# Patient Record
Sex: Female | Born: 1965
Health system: Southern US, Community
[De-identification: ages and names within clinical notes are randomized; demographics above are authoritative.]

## PROBLEM LIST (undated history)

## (undated) DIAGNOSIS — E669 Obesity, unspecified: Secondary | ICD-10-CM

## (undated) DIAGNOSIS — G932 Benign intracranial hypertension: Secondary | ICD-10-CM

## (undated) DIAGNOSIS — E539 Vitamin B deficiency, unspecified: Secondary | ICD-10-CM

## (undated) DIAGNOSIS — J189 Pneumonia, unspecified organism: Secondary | ICD-10-CM

## (undated) DIAGNOSIS — Z87442 Personal history of urinary calculi: Secondary | ICD-10-CM

## (undated) DIAGNOSIS — I10 Essential (primary) hypertension: Secondary | ICD-10-CM

## (undated) DIAGNOSIS — Z9889 Other specified postprocedural states: Secondary | ICD-10-CM

## (undated) DIAGNOSIS — K219 Gastro-esophageal reflux disease without esophagitis: Secondary | ICD-10-CM

## (undated) DIAGNOSIS — M1990 Unspecified osteoarthritis, unspecified site: Secondary | ICD-10-CM

## (undated) DIAGNOSIS — D649 Anemia, unspecified: Secondary | ICD-10-CM

## (undated) DIAGNOSIS — R0600 Dyspnea, unspecified: Secondary | ICD-10-CM

## (undated) DIAGNOSIS — F419 Anxiety disorder, unspecified: Secondary | ICD-10-CM

## (undated) DIAGNOSIS — K449 Diaphragmatic hernia without obstruction or gangrene: Secondary | ICD-10-CM

## (undated) DIAGNOSIS — K5090 Crohn's disease, unspecified, without complications: Secondary | ICD-10-CM

## (undated) DIAGNOSIS — R002 Palpitations: Secondary | ICD-10-CM

## (undated) DIAGNOSIS — I499 Cardiac arrhythmia, unspecified: Secondary | ICD-10-CM

## (undated) DIAGNOSIS — I209 Angina pectoris, unspecified: Secondary | ICD-10-CM

## (undated) DIAGNOSIS — R112 Nausea with vomiting, unspecified: Secondary | ICD-10-CM

## (undated) DIAGNOSIS — N879 Dysplasia of cervix uteri, unspecified: Secondary | ICD-10-CM

## (undated) DIAGNOSIS — J45909 Unspecified asthma, uncomplicated: Secondary | ICD-10-CM

## (undated) DIAGNOSIS — Z862 Personal history of diseases of the blood and blood-forming organs and certain disorders involving the immune mechanism: Secondary | ICD-10-CM

## (undated) DIAGNOSIS — G43909 Migraine, unspecified, not intractable, without status migrainosus: Secondary | ICD-10-CM

## (undated) DIAGNOSIS — K5790 Diverticulosis of intestine, part unspecified, without perforation or abscess without bleeding: Secondary | ICD-10-CM

## (undated) DIAGNOSIS — G4730 Sleep apnea, unspecified: Secondary | ICD-10-CM

## (undated) HISTORY — PX: ABDOMINAL HYSTERECTOMY: SUR658

## (undated) HISTORY — PX: ABDOMINAL HYSTERECTOMY: SHX81

## (undated) HISTORY — DX: Vitamin B deficiency, unspecified: E53.9

## (undated) HISTORY — DX: Benign intracranial hypertension: G93.2

## (undated) HISTORY — PX: SHOULDER SURGERY: SHX246

## (undated) HISTORY — PX: CARDIAC CATHETERIZATION: SHX172

---
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
## 1999-02-03 ENCOUNTER — Encounter: Payer: Self-pay | Admitting: Orthopedic Surgery

## 1999-02-03 ENCOUNTER — Ambulatory Visit (HOSPITAL_COMMUNITY): Admission: RE | Admit: 1999-02-03 | Discharge: 1999-02-03 | Payer: Self-pay | Admitting: Orthopedic Surgery

## 1999-02-05 ENCOUNTER — Ambulatory Visit (HOSPITAL_BASED_OUTPATIENT_CLINIC_OR_DEPARTMENT_OTHER): Admission: RE | Admit: 1999-02-05 | Discharge: 1999-02-05 | Payer: Self-pay | Admitting: Orthopedic Surgery

## 2000-10-22 ENCOUNTER — Ambulatory Visit (HOSPITAL_BASED_OUTPATIENT_CLINIC_OR_DEPARTMENT_OTHER): Admission: RE | Admit: 2000-10-22 | Discharge: 2000-10-22 | Payer: Self-pay | Admitting: Orthopedic Surgery

## 2001-05-09 ENCOUNTER — Ambulatory Visit (HOSPITAL_COMMUNITY): Admission: RE | Admit: 2001-05-09 | Discharge: 2001-05-09 | Payer: Self-pay | Admitting: Orthopedic Surgery

## 2001-05-09 ENCOUNTER — Encounter: Payer: Self-pay | Admitting: Orthopedic Surgery

## 2003-12-13 ENCOUNTER — Ambulatory Visit (HOSPITAL_COMMUNITY): Admission: RE | Admit: 2003-12-13 | Discharge: 2003-12-13 | Payer: Self-pay | Admitting: Family Medicine

## 2003-12-19 ENCOUNTER — Ambulatory Visit (HOSPITAL_COMMUNITY): Admission: RE | Admit: 2003-12-19 | Discharge: 2003-12-19 | Payer: Self-pay | Admitting: Family Medicine

## 2004-01-01 ENCOUNTER — Inpatient Hospital Stay (HOSPITAL_COMMUNITY): Admission: EM | Admit: 2004-01-01 | Discharge: 2004-01-01 | Payer: Self-pay | Admitting: Emergency Medicine

## 2004-01-14 ENCOUNTER — Ambulatory Visit (HOSPITAL_COMMUNITY): Admission: RE | Admit: 2004-01-14 | Discharge: 2004-01-14 | Payer: Self-pay | Admitting: Family Medicine

## 2004-02-12 ENCOUNTER — Ambulatory Visit (HOSPITAL_COMMUNITY): Admission: RE | Admit: 2004-02-12 | Discharge: 2004-02-12 | Payer: Self-pay | Admitting: Internal Medicine

## 2004-10-10 ENCOUNTER — Ambulatory Visit (HOSPITAL_COMMUNITY): Admission: RE | Admit: 2004-10-10 | Discharge: 2004-10-10 | Payer: Self-pay | Admitting: Family Medicine

## 2004-10-22 ENCOUNTER — Ambulatory Visit (HOSPITAL_COMMUNITY): Admission: RE | Admit: 2004-10-22 | Discharge: 2004-10-22 | Payer: Self-pay | Admitting: Family Medicine

## 2004-10-22 ENCOUNTER — Encounter: Payer: Self-pay | Admitting: Orthopedic Surgery

## 2004-11-13 ENCOUNTER — Ambulatory Visit: Payer: Self-pay | Admitting: Orthopedic Surgery

## 2005-01-06 ENCOUNTER — Ambulatory Visit: Payer: Self-pay | Admitting: Internal Medicine

## 2005-01-12 ENCOUNTER — Ambulatory Visit: Payer: Self-pay | Admitting: Cardiology

## 2005-01-29 ENCOUNTER — Ambulatory Visit: Payer: Self-pay | Admitting: Cardiology

## 2005-02-17 ENCOUNTER — Ambulatory Visit: Payer: Self-pay | Admitting: Internal Medicine

## 2005-02-17 ENCOUNTER — Ambulatory Visit (HOSPITAL_COMMUNITY): Admission: RE | Admit: 2005-02-17 | Discharge: 2005-02-17 | Payer: Self-pay | Admitting: Internal Medicine

## 2005-02-20 ENCOUNTER — Ambulatory Visit: Payer: Self-pay | Admitting: Cardiology

## 2005-03-16 ENCOUNTER — Ambulatory Visit: Payer: Self-pay | Admitting: Internal Medicine

## 2005-10-16 ENCOUNTER — Ambulatory Visit: Payer: Self-pay | Admitting: Internal Medicine

## 2005-10-16 ENCOUNTER — Ambulatory Visit (HOSPITAL_COMMUNITY): Admission: RE | Admit: 2005-10-16 | Discharge: 2005-10-16 | Payer: Self-pay | Admitting: Internal Medicine

## 2005-10-20 ENCOUNTER — Encounter (HOSPITAL_COMMUNITY): Admission: RE | Admit: 2005-10-20 | Discharge: 2005-11-19 | Payer: Self-pay | Admitting: Internal Medicine

## 2005-10-22 ENCOUNTER — Ambulatory Visit: Payer: Self-pay | Admitting: Internal Medicine

## 2006-05-18 ENCOUNTER — Emergency Department (HOSPITAL_COMMUNITY): Admission: EM | Admit: 2006-05-18 | Discharge: 2006-05-18 | Payer: Self-pay | Admitting: Emergency Medicine

## 2006-07-19 ENCOUNTER — Ambulatory Visit (HOSPITAL_COMMUNITY): Admission: RE | Admit: 2006-07-19 | Discharge: 2006-07-19 | Payer: Self-pay | Admitting: Family Medicine

## 2006-07-27 ENCOUNTER — Ambulatory Visit: Payer: Self-pay | Admitting: Internal Medicine

## 2006-08-02 ENCOUNTER — Inpatient Hospital Stay (HOSPITAL_COMMUNITY): Admission: EM | Admit: 2006-08-02 | Discharge: 2006-08-07 | Payer: Self-pay | Admitting: Emergency Medicine

## 2006-08-02 ENCOUNTER — Ambulatory Visit: Payer: Self-pay | Admitting: Gastroenterology

## 2006-08-11 ENCOUNTER — Ambulatory Visit (HOSPITAL_COMMUNITY): Admission: RE | Admit: 2006-08-11 | Discharge: 2006-08-11 | Payer: Self-pay | Admitting: Internal Medicine

## 2006-08-24 ENCOUNTER — Encounter (INDEPENDENT_AMBULATORY_CARE_PROVIDER_SITE_OTHER): Payer: Self-pay | Admitting: *Deleted

## 2006-08-24 ENCOUNTER — Ambulatory Visit (HOSPITAL_COMMUNITY): Admission: RE | Admit: 2006-08-24 | Discharge: 2006-08-24 | Payer: Self-pay | Admitting: Internal Medicine

## 2006-08-24 ENCOUNTER — Ambulatory Visit: Payer: Self-pay | Admitting: Internal Medicine

## 2006-09-08 ENCOUNTER — Encounter (INDEPENDENT_AMBULATORY_CARE_PROVIDER_SITE_OTHER): Payer: Self-pay | Admitting: Specialist

## 2006-09-08 ENCOUNTER — Ambulatory Visit (HOSPITAL_COMMUNITY): Admission: RE | Admit: 2006-09-08 | Discharge: 2006-09-08 | Payer: Self-pay | Admitting: General Surgery

## 2006-09-08 HISTORY — PX: CHOLECYSTECTOMY: SHX55

## 2006-10-05 DIAGNOSIS — J189 Pneumonia, unspecified organism: Secondary | ICD-10-CM

## 2006-10-05 HISTORY — DX: Pneumonia, unspecified organism: J18.9

## 2006-11-08 ENCOUNTER — Ambulatory Visit (HOSPITAL_COMMUNITY): Admission: RE | Admit: 2006-11-08 | Discharge: 2006-11-08 | Payer: Self-pay | Admitting: Internal Medicine

## 2006-11-08 ENCOUNTER — Ambulatory Visit: Payer: Self-pay | Admitting: Cardiology

## 2006-11-08 ENCOUNTER — Inpatient Hospital Stay (HOSPITAL_COMMUNITY): Admission: AD | Admit: 2006-11-08 | Discharge: 2006-11-19 | Payer: Self-pay | Admitting: Internal Medicine

## 2006-11-23 ENCOUNTER — Ambulatory Visit (HOSPITAL_COMMUNITY): Admission: RE | Admit: 2006-11-23 | Discharge: 2006-11-23 | Payer: Self-pay | Admitting: Internal Medicine

## 2006-11-26 ENCOUNTER — Ambulatory Visit (HOSPITAL_COMMUNITY): Admission: RE | Admit: 2006-11-26 | Discharge: 2006-11-26 | Payer: Self-pay | Admitting: Family Medicine

## 2006-12-08 ENCOUNTER — Ambulatory Visit (HOSPITAL_COMMUNITY): Admission: RE | Admit: 2006-12-08 | Discharge: 2006-12-08 | Payer: Self-pay | Admitting: Internal Medicine

## 2007-05-25 ENCOUNTER — Inpatient Hospital Stay (HOSPITAL_COMMUNITY): Admission: AD | Admit: 2007-05-25 | Discharge: 2007-05-25 | Payer: Self-pay | Admitting: Obstetrics and Gynecology

## 2007-05-25 ENCOUNTER — Ambulatory Visit (HOSPITAL_COMMUNITY): Admission: RE | Admit: 2007-05-25 | Discharge: 2007-05-25 | Payer: Self-pay | Admitting: Obstetrics and Gynecology

## 2007-05-27 ENCOUNTER — Encounter: Payer: Self-pay | Admitting: Emergency Medicine

## 2007-05-28 ENCOUNTER — Inpatient Hospital Stay (HOSPITAL_COMMUNITY): Admission: EM | Admit: 2007-05-28 | Discharge: 2007-05-29 | Payer: Self-pay | Admitting: Pediatrics

## 2007-06-09 ENCOUNTER — Encounter (HOSPITAL_COMMUNITY): Admission: RE | Admit: 2007-06-09 | Discharge: 2007-07-05 | Payer: Self-pay | Admitting: Pediatrics

## 2007-07-06 ENCOUNTER — Encounter (HOSPITAL_COMMUNITY): Admission: RE | Admit: 2007-07-06 | Discharge: 2007-08-05 | Payer: Self-pay | Admitting: Pediatrics

## 2008-01-04 ENCOUNTER — Ambulatory Visit (HOSPITAL_COMMUNITY): Admission: RE | Admit: 2008-01-04 | Discharge: 2008-01-04 | Payer: Self-pay | Admitting: Family Medicine

## 2008-02-13 ENCOUNTER — Ambulatory Visit (HOSPITAL_COMMUNITY): Admission: RE | Admit: 2008-02-13 | Discharge: 2008-02-13 | Payer: Self-pay | Admitting: Internal Medicine

## 2008-03-13 ENCOUNTER — Emergency Department (HOSPITAL_COMMUNITY): Admission: EM | Admit: 2008-03-13 | Discharge: 2008-03-13 | Payer: Self-pay | Admitting: Emergency Medicine

## 2008-08-07 ENCOUNTER — Emergency Department (HOSPITAL_COMMUNITY): Admission: EM | Admit: 2008-08-07 | Discharge: 2008-08-07 | Payer: Self-pay | Admitting: Emergency Medicine

## 2008-08-29 ENCOUNTER — Ambulatory Visit: Payer: Self-pay | Admitting: Internal Medicine

## 2008-08-30 ENCOUNTER — Encounter: Payer: Self-pay | Admitting: Gastroenterology

## 2008-08-30 LAB — CONVERTED CEMR LAB
ALT: 8 units/L (ref 0–35)
AST: 10 units/L (ref 0–37)
Albumin: 4.3 g/dL (ref 3.5–5.2)
Alkaline Phosphatase: 39 units/L (ref 39–117)
Eosinophils Relative: 2 % (ref 0–5)
Lymphocytes Relative: 38 % (ref 12–46)
Lymphs Abs: 2.2 10*3/uL (ref 0.7–4.0)
Monocytes Absolute: 0.5 10*3/uL (ref 0.1–1.0)
Monocytes Relative: 9 % (ref 3–12)
RDW: 14 % (ref 11.5–15.5)
Total Protein: 6.9 g/dL (ref 6.0–8.3)

## 2008-10-05 ENCOUNTER — Emergency Department (HOSPITAL_COMMUNITY): Admission: EM | Admit: 2008-10-05 | Discharge: 2008-10-05 | Payer: Self-pay | Admitting: Emergency Medicine

## 2008-10-10 ENCOUNTER — Ambulatory Visit: Payer: Self-pay | Admitting: Internal Medicine

## 2008-10-11 ENCOUNTER — Ambulatory Visit: Payer: Self-pay | Admitting: Internal Medicine

## 2008-10-12 ENCOUNTER — Ambulatory Visit: Payer: Self-pay | Admitting: Internal Medicine

## 2008-10-12 ENCOUNTER — Ambulatory Visit (HOSPITAL_COMMUNITY): Admission: RE | Admit: 2008-10-12 | Discharge: 2008-10-12 | Payer: Self-pay | Admitting: Internal Medicine

## 2008-10-30 ENCOUNTER — Ambulatory Visit (HOSPITAL_COMMUNITY): Admission: RE | Admit: 2008-10-30 | Discharge: 2008-10-30 | Payer: Self-pay | Admitting: General Surgery

## 2008-10-30 HISTORY — PX: INGUINAL HERNIA REPAIR: SUR1180

## 2008-12-18 ENCOUNTER — Ambulatory Visit (HOSPITAL_COMMUNITY): Admission: RE | Admit: 2008-12-18 | Discharge: 2008-12-18 | Payer: Self-pay | Admitting: Obstetrics and Gynecology

## 2009-04-23 ENCOUNTER — Telehealth (INDEPENDENT_AMBULATORY_CARE_PROVIDER_SITE_OTHER): Payer: Self-pay

## 2009-08-02 ENCOUNTER — Encounter: Payer: Self-pay | Admitting: Cardiology

## 2009-08-02 ENCOUNTER — Emergency Department (HOSPITAL_COMMUNITY): Admission: EM | Admit: 2009-08-02 | Discharge: 2009-08-02 | Payer: Self-pay | Admitting: Emergency Medicine

## 2009-08-02 ENCOUNTER — Encounter (INDEPENDENT_AMBULATORY_CARE_PROVIDER_SITE_OTHER): Payer: Self-pay

## 2009-08-02 LAB — CONVERTED CEMR LAB
ALT: 18 units/L
BUN: 13 mg/dL
Bilirubin, Direct: 0.6 mg/dL
CK-MB: 1 ng/mL
CO2: 29 meq/L
Calcium: 9.3 mg/dL
Chloride: 105 meq/L
Free T4: 1.06 ng/dL
Glomerular Filtration Rate, Af Am: >60 mL/min/{1.73_m2}
Glucose, Bld: 92 mg/dL
HCT: 38 %
Hemoglobin: 12.8 g/dL
MCV: 87.1 fL
Potassium: 3.1 meq/L
Sodium: 141 meq/L
Troponin I: <0.05 ng/mL
aPTT: 30 s

## 2009-08-13 ENCOUNTER — Encounter: Payer: Self-pay | Admitting: Cardiology

## 2009-08-13 ENCOUNTER — Encounter (INDEPENDENT_AMBULATORY_CARE_PROVIDER_SITE_OTHER): Payer: Self-pay

## 2009-08-13 ENCOUNTER — Ambulatory Visit: Payer: Self-pay | Admitting: Cardiology

## 2009-08-13 ENCOUNTER — Encounter: Payer: Self-pay | Admitting: Adult Health

## 2009-08-13 DIAGNOSIS — R0602 Shortness of breath: Secondary | ICD-10-CM | POA: Insufficient documentation

## 2009-08-13 DIAGNOSIS — K449 Diaphragmatic hernia without obstruction or gangrene: Secondary | ICD-10-CM | POA: Insufficient documentation

## 2009-08-13 DIAGNOSIS — I10 Essential (primary) hypertension: Secondary | ICD-10-CM | POA: Insufficient documentation

## 2009-08-13 DIAGNOSIS — R002 Palpitations: Secondary | ICD-10-CM | POA: Insufficient documentation

## 2009-08-14 ENCOUNTER — Encounter: Payer: Self-pay | Admitting: Adult Health

## 2009-08-14 ENCOUNTER — Ambulatory Visit: Payer: Self-pay | Admitting: Cardiology

## 2009-08-14 ENCOUNTER — Ambulatory Visit (HOSPITAL_COMMUNITY): Admission: RE | Admit: 2009-08-14 | Discharge: 2009-08-14 | Payer: Self-pay | Admitting: Cardiology

## 2009-08-15 ENCOUNTER — Ambulatory Visit (HOSPITAL_COMMUNITY): Admission: RE | Admit: 2009-08-15 | Discharge: 2009-08-15 | Payer: Self-pay | Admitting: Cardiology

## 2009-08-15 ENCOUNTER — Encounter: Payer: Self-pay | Admitting: Cardiology

## 2009-08-19 ENCOUNTER — Encounter: Payer: Self-pay | Admitting: Cardiology

## 2009-08-26 ENCOUNTER — Ambulatory Visit: Payer: Self-pay | Admitting: Internal Medicine

## 2009-09-09 ENCOUNTER — Ambulatory Visit: Payer: Self-pay | Admitting: Cardiology

## 2009-10-09 ENCOUNTER — Encounter: Payer: Self-pay | Admitting: Internal Medicine

## 2009-10-30 ENCOUNTER — Ambulatory Visit (HOSPITAL_COMMUNITY): Admission: RE | Admit: 2009-10-30 | Discharge: 2009-10-30 | Payer: Self-pay | Admitting: Pulmonary Disease

## 2009-11-03 ENCOUNTER — Ambulatory Visit: Admission: RE | Admit: 2009-11-03 | Discharge: 2009-11-03 | Payer: Self-pay | Admitting: Pulmonary Disease

## 2009-11-04 ENCOUNTER — Ambulatory Visit (HOSPITAL_COMMUNITY): Admission: RE | Admit: 2009-11-04 | Discharge: 2009-11-04 | Payer: Self-pay | Admitting: Pulmonary Disease

## 2009-11-11 ENCOUNTER — Telehealth: Payer: Self-pay | Admitting: Internal Medicine

## 2009-11-13 ENCOUNTER — Ambulatory Visit: Payer: Self-pay | Admitting: Internal Medicine

## 2009-11-15 ENCOUNTER — Telehealth: Payer: Self-pay | Admitting: Internal Medicine

## 2009-11-18 ENCOUNTER — Encounter: Payer: Self-pay | Admitting: Internal Medicine

## 2009-11-19 ENCOUNTER — Encounter (INDEPENDENT_AMBULATORY_CARE_PROVIDER_SITE_OTHER): Payer: Self-pay | Admitting: *Deleted

## 2009-11-22 ENCOUNTER — Inpatient Hospital Stay (HOSPITAL_BASED_OUTPATIENT_CLINIC_OR_DEPARTMENT_OTHER): Admission: RE | Admit: 2009-11-22 | Discharge: 2009-11-22 | Payer: Self-pay | Admitting: Internal Medicine

## 2009-11-22 ENCOUNTER — Ambulatory Visit: Payer: Self-pay | Admitting: Internal Medicine

## 2009-11-24 ENCOUNTER — Emergency Department (HOSPITAL_COMMUNITY): Admission: EM | Admit: 2009-11-24 | Discharge: 2009-11-24 | Payer: Self-pay | Admitting: Emergency Medicine

## 2009-12-05 ENCOUNTER — Encounter (INDEPENDENT_AMBULATORY_CARE_PROVIDER_SITE_OTHER): Payer: Self-pay | Admitting: *Deleted

## 2009-12-30 ENCOUNTER — Ambulatory Visit: Payer: Self-pay | Admitting: Internal Medicine

## 2010-01-02 LAB — CONVERTED CEMR LAB: Vit D, 25-Hydroxy: 14 ng/mL — ABNORMAL LOW (ref 30–89)

## 2010-02-25 ENCOUNTER — Ambulatory Visit: Payer: Self-pay | Admitting: Orthopedic Surgery

## 2010-02-25 DIAGNOSIS — M2550 Pain in unspecified joint: Secondary | ICD-10-CM | POA: Insufficient documentation

## 2010-02-25 DIAGNOSIS — M1712 Unilateral primary osteoarthritis, left knee: Secondary | ICD-10-CM | POA: Insufficient documentation

## 2010-04-02 ENCOUNTER — Telehealth: Payer: Self-pay | Admitting: Orthopedic Surgery

## 2010-08-25 ENCOUNTER — Ambulatory Visit (HOSPITAL_COMMUNITY)
Admission: RE | Admit: 2010-08-25 | Discharge: 2010-08-25 | Payer: Self-pay | Source: Home / Self Care | Admitting: Family Medicine

## 2010-08-25 ENCOUNTER — Ambulatory Visit: Payer: Self-pay | Admitting: Internal Medicine

## 2010-09-25 ENCOUNTER — Ambulatory Visit (HOSPITAL_COMMUNITY)
Admission: RE | Admit: 2010-09-25 | Discharge: 2010-09-25 | Payer: Self-pay | Source: Home / Self Care | Attending: Internal Medicine | Admitting: Internal Medicine

## 2010-09-25 ENCOUNTER — Ambulatory Visit: Payer: Self-pay | Admitting: Internal Medicine

## 2010-10-24 ENCOUNTER — Ambulatory Visit (HOSPITAL_COMMUNITY)
Admission: RE | Admit: 2010-10-24 | Discharge: 2010-10-24 | Payer: Self-pay | Source: Home / Self Care | Attending: Internal Medicine | Admitting: Internal Medicine

## 2010-10-24 ENCOUNTER — Ambulatory Visit: Admit: 2010-10-24 | Payer: Self-pay | Admitting: Internal Medicine

## 2010-10-25 ENCOUNTER — Encounter: Payer: Self-pay | Admitting: Family Medicine

## 2010-10-26 ENCOUNTER — Encounter: Payer: Self-pay | Admitting: Family Medicine

## 2010-10-27 ENCOUNTER — Ambulatory Visit: Admit: 2010-10-27 | Payer: Self-pay | Admitting: Internal Medicine

## 2010-10-28 ENCOUNTER — Ambulatory Visit (HOSPITAL_COMMUNITY)
Admission: RE | Admit: 2010-10-28 | Discharge: 2010-10-28 | Payer: Self-pay | Source: Home / Self Care | Attending: Internal Medicine | Admitting: Internal Medicine

## 2010-11-02 LAB — CONVERTED CEMR LAB
Basophils Absolute: 0 10*3/uL (ref 0.0–0.1)
Basophils Absolute: 0.3 10*3/uL — ABNORMAL HIGH (ref 0.0–0.1)
CO2: 30 meq/L (ref 19–32)
Calcium: 9.1 mg/dL (ref 8.4–10.5)
Calcium: 9.4 mg/dL (ref 8.4–10.5)
Chloride: 105 meq/L (ref 96–112)
Creatinine, Ser: 0.9 mg/dL (ref 0.4–1.2)
Eosinophils Absolute: 0.2 10*3/uL (ref 0.0–0.7)
Eosinophils Absolute: 0.2 10*3/uL (ref 0.0–0.7)
Eosinophils Relative: 4.1 % (ref 0.0–5.0)
Folate: 8.3 ng/mL
GFR calc non Af Amer: 88.24 mL/min (ref >60)
Glucose, Bld: 88 mg/dL (ref 70–99)
Glucose, Bld: 98 mg/dL (ref 70–99)
HCT: 41 % (ref 36.0–46.0)
Hemoglobin: 12.3 g/dL (ref 12.0–15.0)
Lymphocytes Relative: 26.7 % (ref 12.0–46.0)
Lymphs Abs: 1.4 10*3/uL (ref 0.7–4.0)
MCHC: 33.1 g/dL (ref 30.0–36.0)
MCV: 88.5 fL (ref 78.0–100.0)
MCV: 88.8 fL (ref 78.0–100.0)
Monocytes Absolute: 0.3 10*3/uL (ref 0.1–1.0)
Monocytes Absolute: 0.3 10*3/uL (ref 0.1–1.0)
Neutro Abs: 3.5 10*3/uL (ref 1.4–7.7)
Neutrophils Relative %: 59.5 % (ref 43.0–77.0)
Neutrophils Relative %: 59.7 % (ref 43.0–77.0)
Platelets: 365 10*3/uL (ref 150.0–400.0)
Potassium: 3.9 meq/L (ref 3.5–5.1)
Pro B Natriuretic peptide (BNP): 6 pg/mL (ref 0.0–100.0)
RDW: 13.5 % (ref 11.5–14.6)
RDW: 13.5 % (ref 11.5–14.6)
Sodium: 141 meq/L (ref 135–145)
Vitamin B-12: 279 pg/mL (ref 211–911)
WBC: 5 10*3/uL (ref 4.5–10.5)

## 2010-11-06 NOTE — Progress Notes (Signed)
Summary: status of cath date  Phone Note Call from Patient Call back at Home Phone [phone]   Caller: Patient Reason for Call: Talk to Nurse Action Taken: Patient advised to call 911 Details for Reason: Per pt calling , status  of cath. date  pt aware that  db nurse is not here today. Initial call taken by: Neil Crouch,  November 15, 2009 4:39 PM  Follow-up for Phone Call        Spoke with patient . Pt. states she is scheduled for a cardic cath on next Friday 2/18/ 11 in the [place] lab . Pt. needs to know the time. I called the [place] lab spoke with Maudie Mercury which states pt. is not on the schedule. He also could not search for it on the computer Kim suggest  to call the [place] lab back on  monday to find out. I also called Nettie Elm if she had pt. cath. papers. none have given to her on this patient.  I let pt. know we will find out the time of the  cath. early next week.  We will call her  asap. Okay with pt. Carollee Sires, RN, BSN  November 15, 2009 5:19 PM   Additional Follow-up for Phone Call Additional follow up Details #1::        pt calling back, req call back, [phone], Darnell Level  November 18, 2009 10:22 AM     Additional Follow-up for Phone Call Additional follow up Details #2::    HEATHER ORDERS NEED TO BE SIGNED  CATH SCHEDULED FOR FRI AT 7:30 PT AWARE. Follow-up by: Devra Dopp, LPN,  November 19, 7515 1:02 PM

## 2010-11-06 NOTE — Progress Notes (Signed)
Summary: sob, pain in chest  Phone Note Call from Patient Call back at Home Phone [phone] Call back at Work Phone [phone]   Caller: Patient Reason for Call: Talk to Nurse Details for Reason: Per pt calling need to changed appt from Nov 25, 2022 with db due to death in family. offer 11/26/2022 appt . pt c/o sob, pain in chest, swelling in right sided of face and neck. inform pt that i will send an important  message to nurse.  Initial call taken by: Neil Crouch,  November 11, 2009 9:42 AM  Follow-up for Phone Call        spoke w/pt no acute symptoms, appt resch to Wed 2022-11-26 at [facility], RN  November 11, 2009 12:02 PM

## 2010-11-06 NOTE — Miscellaneous (Signed)
Summary: Orders Update  Clinical Lists Changes  Orders: Added new Test order of TLB-B12 + Folate Pnl ([id number]/PNS) - Signed

## 2010-11-06 NOTE — Letter (Signed)
Summary: Cardiac Catheterization Instructions- [place] [place]  [address] [place]   [place], [place] [address]   Phone: [phone]  Fax: [phone]     11/13/2009 MRN: [id number]  [place], [place]  [address]  Dear Ms. Litsey,   You are scheduled for a Cardiac Catheterization on Friday 11/22/09 with Dr.Bensimhon  Please arrive to the 1st floor of the Heart and Vascular Center at [facility] at _____ am / pm on the day of your procedure. Please do not arrive before 6:30 a.m. Call the Heart and Vascular Center at [phone] if you are unable to make your appointmnet. The Code to get into the parking garage under the building is 0900. Take the elevators to the 1st floor. You must have someone to drive you home. Someone must be with you for the first 24 hours after you arrive home. Please wear clothes that are easy to get on and off and wear slip-on shoes. Do not eat or drink after midnight except water with your medications that morning. Bring all your medications and current insurance cards with you.  ___ DO NOT take these medications before your procedure: ________________________________________________________________  ___ Make sure you take your aspirin.  ___ You may take ALL of your medications with water that morning. ________________________________________________________________________________________________________________________________  ___ DO NOT take ANY medications before your procedure.  _XX__ Pre-med instructions:    Take Prednisone 39m 3 tabs PM before cath and 3 tabs AM before cath  The usual length of stay after your procedure is 2 to 3 hours. This can vary.  If you have any questions, please call the office at the number listed above.   HKevan Rosebush RN

## 2010-11-06 NOTE — Letter (Signed)
Summary: [facility] Medicine [facility] Disease of the Chest  Internal Medicine & Disease of the Chest   Imported By: Sallee Provencal 11/14/2009 13:16:13  _____________________________________________________________________  External Attachment:    Type:   Image     Comment:   External Document

## 2010-11-06 NOTE — Letter (Signed)
Summary: Generic Letter, Intro to Referring  [facility]  [address]   [place], [place] [address]   Phone: [phone]  Fax: [phone]      December 05, 2009             RE: [name]   31-Dec-1965                 [address]                 [place], [place]  [address]  Dear Gillermina Hu,  You referred this patient to our office for consult. We have tried to contact patient by phone and mail. Patient has failed to contact our office for appointment.  Sincerely,   [facility] Ph: [phone]   Fax: [phone]

## 2010-11-06 NOTE — Letter (Signed)
Summary: History form  History form   Imported By: Ruffin Pyo 02/28/2010 09:22:14  _____________________________________________________________________  External Attachment:    Type:   Image     Comment:   External Document

## 2010-11-06 NOTE — Letter (Signed)
Summary: Appointment Reminder  [facility]  [address]   [place], [place] [address]   Phone: [phone]  Fax: [phone]       November 19, 2009   [name] [address] [place], [place]  [address] [birth year]    Dear Ms. Heckmann,  We have been unable to reach you by phone to schedule a follow up   appointment that was recommended for you by Dr. Gala Romney. It is very   important that we reach you to schedule an appointment. We hope that you  allow [place] to participate in your health care needs. Please contact us at  [phone] at your earliest convenience to schedule your appointment.  Sincerely,    [facility] R. Garfield Cornea, M.D.    Caro Hight, M.D. Vickey Huger, FNP-BC    Neil Crouch, PA-C Phone: [phone]    Fax: [phone]

## 2010-11-06 NOTE — Assessment & Plan Note (Signed)
Summary: PER CHECK OUT/SF   Primary Provider:  Dr.John Hilma Favors  CC:  SOB and CP on and off with swelling mostly R side with nausea.  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 She has a h/o Cp and dyspnea and underwent cath in 2005 with normal cors.   Recently having problems with CP, palpitations and SOB with epsiode of hypoxemia while at work last year.   Saw Jory Sims in 11/10  for f/u. Had PFTs and underwent stress echo. Stress echo 08/15/2009. EF 60%. No ischemia. No comment on RV. PFTs with small airway obstruction and mild restriction. 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    Has also been seeing Dr. Luan Pulling for pulmonary w/u and everything essentially normal.  Had monitor whcih I reviewed today. Sinus rhythm with rare PVC.  Returns for f/u. About twice a month gets episode of chest tightness, SOB and tingling. Worse when she moves around but not reproducible. Many times she goes to work and is fine. Gets SOB on steps now which is unusual for her. Mild edema. Very frustrated with not being able to find an answer.      Current Medications (verified): 1)  Flexeril 5 Mg Tabs (Cyclobenzaprine Hcl) .... Take 1 Tab As Needed 2)  Vivelle-Dot 0.1 Mg/24hr Pttw (Estradiol) .... Change Patch Every 3 Days 3)  Levsin 0.125 Mg Tabs (Hyoscyamine Sulfate) .... As Needed 4)  Ibuprofen 600 Mg Tabs (Ibuprofen) .... As Needed  Allergies (verified): 1)  ! * Dilauted 2)  ! * Iv Dye  Past History:  Past Medical History: Current Problems:  h/o CP and dypnea    -cardiac cath 2005. normal EF and cors    -stress echo normal: 11/10: EF 60% Obesity Palpitations   -- holter monito 12/10  SR with PVC HIATAL HERNIA (ICD-553.3) Hypokalemia Chronic diarrhea  Review of Systems       As per HPI and past medical history; otherwise all systems negative.   Vital Signs:  Patient profile:   45 year old  female Height:      70 inches Weight:      259 pounds BMI:     37.30 Pulse rate:   75 / minute BP sitting:   138 / 84  (left arm) Cuff size:   regular  Vitals Entered By: Mignon Pine, RMA (November 13, 2009 3:26 PM)  Physical Exam  General:  Gen: well appearing. no resp difficulty HEENT: normal Neck: supple. no JVD. Carotids 2+ bilat; no bruits. No lymphadenopathy or thryomegaly appreciated. Cor: PMI nondisplaced. Regular rate & rhythm. No rubs, gallops, murmur. Lungs: clear Abdomen: obese. soft, nontender, nondistended. Normal bowel sounds. Extremities: no cyanosis, clubbing, rash, edema Neuro: alert & orientedx3, cranial nerves grossly intact. moves all 4 extremities w/o difficulty. affect pleasant    Impression & Recommendations:  Problem # 1:  CHEST TIGHTNESS-PRESSURE-OTHER ([id number]-465681) Chest pain and dyspnea. No clear etiology despite extensive work-up. We discussed options and at this point I think we should proceed with R and L heart cath to clear evalaute. sSuspect there may also be component of stress-mediated symtpoms.   Other Orders: TLB-BMP (Basic Metabolic Panel-BMET) (27517-GYFVCBS) TLB-CBC Platelet - w/Differential (85025-CBCD) TLB-PT (Protime) (85610-PTP) Cardiac Catheterization (Cardiac Cath)  Patient Instructions: 1)  Labs today 2)  Your physician has requested that you have a cardiac catheterization.  Cardiac catheterization is used to diagnose and/or treat various  heart conditions. Doctors may recommend this procedure for a number of different reasons. The most common reason is to evaluate chest pain. Chest pain can be a symptom of coronary artery disease (CAD), and cardiac catheterization can show whether plaque is narrowing or blocking your heart's arteries. This procedure is also used to evaluate the valves, as well as measure the blood flow and oxygen levels in different parts of your heart.  For further information please visit [email or web address].   Please follow instruction sheet, as given.

## 2010-11-06 NOTE — Cardiovascular Report (Signed)
Summary: Pre Cath Orders  Pre Cath Orders   Imported By: Sallee Provencal 11/20/2009 10:57:41  _____________________________________________________________________  External Attachment:    Type:   Image     Comment:   External Document

## 2010-11-06 NOTE — Assessment & Plan Note (Signed)
Summary: [name]   Vital Signs:  Patient profile:   45 year old female Height:      72 inches Weight:      256 pounds Pulse rate:   78 / minute Resp:     16 per minute  Visit Type:  New patient Referring Provider:  self Primary Provider:  Dr.John Golding-and Dr Luan Pulling  CC:  bilateral knee pain.  History of Present Illness: I saw [name] in the office today for an initial visit.  She is a 45 years old woman with the complaint of:  bilateral knee pain, right worse.  Xrays today in our office.  Meds: Viville dot, Levsin, Flexeril.  MRI L spine 2008 for review.  She complains of pain in her knees for several years which is worse at the end of the day.  She also has a history of swelling by the end of the day.  Chest throbbing burning pain mainly in the front of the knee but also in the back of the knee on the RIGHT.  Swelling is relieved by elevation.  She has some tingling locking catching and swelling as well.  She has a history of spondylolisthesis treated with physical therapy successfully  She has a family history of rheumatoid arthritis but has not been tested.  She does complain of popping and grinding in both knees.      Allergies: 1)  ! * Dilauted 2)  ! * Iv Dye  Past History:  Past Surgical History: hysterectomy 2 shoulder surgerys right laparoscopic cholecystectomy 2007 cardiac cath GED colonoscopy  Family History: M alive 33. +HTN recently had heart cath and it was good F alive 53. h/o lupus/copd FH of Cancer:  Family History of Diabetes Family History Coronary Heart Disease female < 70 Family History of Arthritis Hx, family, chronic respiratory condition Hx, family, asthma Hx, family, kidney disease NEC  Social History: Full Time resp therapist.  Tobacco Use - No.  Patient is married.  no alcohol no caffeine  Review of Systems Constitutional:  Complains of weight gain and fatigue; denies weight  loss, fever, and chills. Cardiovascular:  Complains of chest pain and palpitations; denies fainting and murmurs. Respiratory:  Complains of short of breath, wheezing, couch, tightness, and snoring; denies pain on inspiration and snoring . Gastrointestinal:  Complains of nausea, diarrhea, and constipation; denies heartburn, vomiting, and blood in your stools. Genitourinary:  Complains of urgency, difficulty urinating, painful urination, and bleeding in urine; denies frequency and flank pain. Neurologic:  Complains of numbness, tingling, and dizziness; denies unsteady gait, tremors, and seizure. Musculoskeletal:  Complains of joint pain, swelling, instability, stiffness, and muscle pain; denies redness and heat. Endocrine:  Complains of heat or cold intolerance; denies excessive thirst and exessive urination. Psychiatric:  Denies nervousness, depression, anxiety, and hallucinations. Skin:  Denies changes in the skin, poor healing, rash, itching, and redness. HEENT:  Complains of blurred or double vision and eye pain; denies redness and watering. Immunology:  Complains of seasonal allergies; denies sinus problems and allergic to bee stings. Hemoatologic:  Complains of easy bleeding; denies brusing.  Physical Exam  Skin:  intact without lesions or rashes Inguinal Nodes:  no significant adenopathy Psych:  alert and cooperative; normal mood and affect; normal attention span and concentration   Knee Exam  General:    Well-developed, well-nourished, normal body habitus; no deformities, normal grooming.  Gait:    Normal heel-toe gait pattern bilaterally.    Skin:  Intact, no scars, lesions, rashes, cafe au lait spots, or bruising.    Inspection:     No deformity, ecchymosis or swelling.   Palpation:    tenderness and crepitance is noted in both knees with range of motion.  no effusion is noted.  Vascular:    There was no swelling or varicose veins. The pulses and temperature are  normal. There was no edema or tenderness.  Sensory:    Gross coordination and sensation were normal.    Motor:    Motor strength 5/5 bilaterally for quadriceps, hamstrings, ankle dorsiflexion, and ankle plantar flexion.    Reflexes:    Normal and symmetric patellar and Achilles reflexes bilaterally.    Knee Exam:    Right:    Inspection:  Abnormal    Palpation:  Abnormal    Stability:  stable    Tenderness:  medial joint line    Swelling:  no    Erythema:  no    Range of Motion:       Flexion-Active: full       Extension-Active: full       Flexion-Passive: full       Extension-Passive: full    Left:    Inspection:  Abnormal    Palpation:  Abnormal    Stability:  stable    Tenderness:  medial joint line    Swelling:  no    Erythema:  no    Range of Motion:       Flexion-Active: full       Extension-Active: full       Flexion-Passive: full       Extension-Passive: full   Impression & Recommendations:  Problem # 1:  KNEE, ARTHRITIS, DEGEN./OSTEO (ICD-715.96) Assessment New   celebrex 230m   RA test   Her updated medication list for this problem includes:    Flexeril 5 Mg Tabs (Cyclobenzaprine hcl) ..Marland Kitchen.. Take 1 tab as needed    Ibuprofen 600 Mg Tabs (Ibuprofen) ..Marland Kitchen.. As needed    Celebrex 200 Mg Caps (Celecoxib) ..Marland Kitchen.. 1 by mouth q day  Orders: New Patient Level III ((47092 Knees  x-ray bilateral (([id number] Bilateral knee x-rays we see that she has a significant medial joint space narrowing RIGHT greater than LEFT, much more than we would expect for her age group which explained her increasing pain  Impression varus osteoarthritis moderate both knees  Medications Added to Medication List This Visit: 1)  Celebrex 200 Mg Caps (Celecoxib) ..Marland Kitchen. 1 by mouth q day  Other Orders: T-Antinuclear Antib (ANA) ([phone] T-Rheumatoid Factor ([phone]  Patient Instructions: 1)  RA test  2)  start celbrex 200 mg q day  3)  come back in a  month Prescriptions: CELEBREX 200 MG CAPS (CELECOXIB) 1 by mouth q day  #30 x 1   Entered and Authorized by:   SArther AbbottMD   Signed by:   SArther AbbottMD on 02/25/2010   Method used:   Print then Give to Patient   RxID:   [id number]

## 2010-11-06 NOTE — Progress Notes (Signed)
Summary: patient had to cancel appt for fol/up/labs  Phone Note Call from Patient   Caller: Patient Summary of Call: Patient called to cancel her 04/03/10 appointment for fol/up labs and re-check, due to work.  Has not re-sched'd yet d/t waiting for work schedule.  If need to call patient with results, home Ph # [phone] best number, has ans machine. Initial call taken by: Ihor Austin,  April 02, 2010 10:42 AM

## 2010-11-06 NOTE — Assessment & Plan Note (Signed)
Summary: eph/post cath / gd   Visit Type:  Follow-up Primary Provider:  Dr.John Golding-and Dr Luan Pulling  CC:  sob -chest pain-Pt just feels uncomfortable.  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 She has a h/o Cp and dyspnea and underwent cath in 2005 with normal cors.   Recently having problems with CP, palpitations and SOB with epsiode of hypoxemia while at work last year.   Underwent repeat cath in Febuary with normal cors and only minimally elevated pulmonary pressures. Symptoms felt to be due to stress.   Central aortic pressure 147/84 with a mean of 113. LV pressure 133/12 with EDP of 19.  Right atrial pressure mean of  8.  RV pressure 34/6 with EDP of 11.  PA pressure 33/15 with a mean of 24.  Pulmonary capillary wedge pressure mean of 13.  Fick cardiac output was 9.7 L per minute with a cardiac index of 4.2 L. Thermodilution cardiac output 7.3 L per minute with a cardiac index of 3.1 Pulmonary vascular resistance by the Fick method was 1.1 Woods, by thermodilution method was 1.5 Woods.  There was no aortic valve gradient on pullback.  Here for post cath f/u.  Feeling a bit better. Still with episodes of palpitations and occasional dyspnea but better.  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 Post cath had fever and found to have UTI and URI. treated with anitbiotics.    Current Medications (verified): 1)  Flexeril 5 Mg Tabs (Cyclobenzaprine Hcl) .... Take 1 Tab As Needed 2)  Vivelle-Dot 0.1 Mg/24hr Pttw (Estradiol) .... Change Patch Every 3 Days 3)  Levsin 0.125 Mg Tabs (Hyoscyamine Sulfate) .... As Needed 4)  Ibuprofen 600 Mg Tabs (Ibuprofen) .... As Needed 5)  Allergy Med .... Prn  Allergies (verified): 1)  ! * Dilauted 2)  ! * Iv Dye  Review of Systems       As per HPI and past medical history; otherwise all systems negative.   Vital Signs:  Patient profile:   45 year old female Height:      70  inches Weight:      256 pounds BMI:     36.86 Pulse rate:   63 / minute BP sitting:   134 / 94  (left arm) Cuff size:   large  Vitals Entered By: Lubertha Basque, CNA (December 30, 2009 10:41 AM)  Physical Exam  General:  Gen: well appearing. no resp difficulty HEENT: normal Neck: supple. no JVD. Carotids 2+ bilat; no bruits. No lymphadenopathy or thryomegaly appreciated. Cor: PMI nondisplaced. Regular rate & rhythm. No rubs, gallops, murmur. Lungs: clear Abdomen: obese. soft, nontender, nondistended. Normal bowel sounds. Extremities: no cyanosis, clubbing, rash, edema. Groin ok Neuro: alert & orientedx3, cranial nerves grossly intact. moves all 4 extremities w/o difficulty. affect pleasant    Impression & Recommendations:  Problem # 1:  CHEST TIGHTNESS-PRESSURE-OTHER ([id number]) Cath is very reassuring. We reviewed results. No significant pulmonary HTN on cath. Have reassured her.  Will continue to follow as needed. Suggested routine exercise program.   Other Orders: T-Vitamin D 25 Hydroxy ([place]) (82306-VD25) TLB-CBC Platelet - w/Differential (85025-CBCD) TLB-BMP (Basic Metabolic Panel-BMET) (49675-FFMBWGY) TLB-BNP (B-Natriuretic Peptide) (83880-BNPR)  Patient Instructions: 1)  Your physician recommends that you schedule a follow-up appointment in: 12 months with Dr Haroldine Laws

## 2010-11-08 NOTE — Op Note (Signed)
  [name], [name]              ACCOUNT NO.:  [id number]  MEDICAL RECORD NO.:  [id number]          PATIENT TYPE:  AMB  LOCATION:  DAY                           FACILITY:  [facility]  PHYSICIAN:  Hildred Laser, M.D.    DATE OF BIRTH:  1966/04/28  DATE OF PROCEDURE:  10/28/2010 DATE OF DISCHARGE:  10/24/2010                              OPERATIVE REPORT   PROCEDURE:  Small bowel given capsule study.  INDICATIONS:  [name] is a 45 year old female who has chronic diarrhea, epigastric pain and intermittent nausea and vomiting.  She had EGD and colonoscopy few days earlier.  She was found to have few erosions in her terminal ileum.  Biopsy revealed acute and chronic ileitis without granulomatous.  A differential diagnosis felt to be NSAID-induced versus Crohn disease.  She is, therefore, undergoing this study to examine rest of her small bowel.  Procedure risks were reviewed with the patient and informed consent was obtained.  FINDINGS:  The patient was able to swallow given capsule without any difficulty.  Capsule reached the stomach in 1 minute and 26 seconds, duodenum in 36 minutes and 55 seconds and ileocecal valve in 4 hours, 4 minutes and 39 seconds.  Capsule reached the cecum at 4 hours, 4 minutes and 40 seconds.  Study duration was 8 hours.  Two ulcers are identified.  The larger of the 2 ulcers best seen on frame at 3 hours, 29 minutes and 40 seconds.  Another ulcer is right at the ileocecal valve.  Between these 2 ulcers, there are multiple erosions and few erosions proximal to the ulcer is also seen at 3 hours, 29 minutes and 40 seconds.  There is also some nodularity.  There is a nodule at 3 hours, 2 minutes and 7 seconds and another one at 3 hours, 12 minutes and 58 seconds and he has another one at 3 hours 49 minutes.  In addition to these findings, there is an area in the mid small bowel best seen starting at 2 hours and 1 minute with mucosal erythema, edema and  deformity to the lumen.  Couple of these lumen is narrowed with a fish mouth appearance.  No ulcers noted in this area.  FINAL DIAGNOSES: 1. Segmental abnormality to mid small bowel with mucosal edema,     erythema and deformity to lumen without high-grade stricture. 2. Multiple erosions noted in terminal ileum along with 2 ulcers and     mucosal nodularity. 3. I am concerned that these changes are suggestive of small bowel     Crohn disease.  RECOMMENDATIONS:  We will proceed with small-bowel follow-through before further recommendations are made.  Please note that, I reviewed these findings and recommendations.  The patient and she is agreeable.     Hildred Laser, M.D.     NR/MEDQ  D:  10/28/2010  T:  10/29/2010  Job:  [id number]  cc:   Halford Chessman, M.D. Fax: [phone]  Electronically Signed by Hildred Laser M.D. on 11/08/2010 01:28:12 PM

## 2010-11-10 ENCOUNTER — Ambulatory Visit (INDEPENDENT_AMBULATORY_CARE_PROVIDER_SITE_OTHER): Payer: Self-pay | Admitting: Internal Medicine

## 2010-12-15 LAB — FECAL LACTOFERRIN, QUANT: Fecal Lactoferrin: POSITIVE

## 2010-12-15 LAB — STOOL CULTURE

## 2010-12-24 LAB — POCT I-STAT 3, VENOUS BLOOD GAS (G3P V)
Acid-Base Excess: 1 mmol/L (ref 0.0–2.0)
Acid-Base Excess: 2 mmol/L (ref 0.0–2.0)
Bicarbonate: 26.7 mEq/L — ABNORMAL HIGH (ref 20.0–24.0)
Bicarbonate: 27.3 mEq/L — ABNORMAL HIGH (ref 20.0–24.0)
Bicarbonate: 27.5 mEq/L — ABNORMAL HIGH (ref 20.0–24.0)
Bicarbonate: 27.6 mEq/L — ABNORMAL HIGH (ref 20.0–24.0)
O2 Saturation: 76 %
O2 Saturation: 76 %
O2 Saturation: 78 %
TCO2: 27 mmol/L (ref 0–100)
TCO2: 29 mmol/L (ref 0–100)
TCO2: 29 mmol/L (ref 0–100)
pCO2, Ven: 46.4 mmHg (ref 45.0–50.0)
pCO2, Ven: 47.9 mmHg (ref 45.0–50.0)
pCO2, Ven: 48.7 mmHg (ref 45.0–50.0)
pH, Ven: 7.361 — ABNORMAL HIGH (ref 7.250–7.300)
pH, Ven: 7.367 — ABNORMAL HIGH (ref 7.250–7.300)
pH, Ven: 7.372 — ABNORMAL HIGH (ref 7.250–7.300)
pO2, Ven: 36 mmHg (ref 30.0–45.0)
pO2, Ven: 38 mmHg (ref 30.0–45.0)
pO2, Ven: 42 mmHg (ref 30.0–45.0)
pO2, Ven: 43 mmHg (ref 30.0–45.0)
pO2, Ven: 43 mmHg (ref 30.0–45.0)

## 2010-12-24 LAB — URINALYSIS, ROUTINE W REFLEX MICROSCOPIC
Glucose, UA: NEGATIVE mg/dL
Hgb urine dipstick: NEGATIVE
Leukocytes, UA: NEGATIVE
Specific Gravity, Urine: 1.023 (ref 1.005–1.030)
Urobilinogen, UA: 0.2 mg/dL (ref 0.0–1.0)

## 2010-12-24 LAB — CBC
HCT: 37.6 % (ref 36.0–46.0)
Hemoglobin: 12.8 g/dL (ref 12.0–15.0)
MCHC: 33.9 g/dL (ref 30.0–36.0)
MCV: 87.5 fL (ref 78.0–100.0)
RDW: 14.4 % (ref 11.5–15.5)

## 2010-12-24 LAB — COMPREHENSIVE METABOLIC PANEL
BUN: 7 mg/dL (ref 6–23)
Calcium: 8.9 mg/dL (ref 8.4–10.5)
Creatinine, Ser: 1.02 mg/dL (ref 0.4–1.2)
Glucose, Bld: 98 mg/dL (ref 70–99)
Total Protein: 7.1 g/dL (ref 6.0–8.3)

## 2010-12-24 LAB — DIFFERENTIAL
Basophils Relative: 0 % (ref 0–1)
Lymphs Abs: 0.9 10*3/uL (ref 0.7–4.0)
Monocytes Relative: 12 % (ref 3–12)
Neutro Abs: 2.5 10*3/uL (ref 1.7–7.7)
Neutrophils Relative %: 62 % (ref 43–77)

## 2010-12-24 LAB — URINE MICROSCOPIC-ADD ON

## 2010-12-24 LAB — POCT I-STAT 3, ART BLOOD GAS (G3+)
pCO2 arterial: 42.2 mmHg (ref 35.0–45.0)
pH, Arterial: 7.385 (ref 7.350–7.400)
pO2, Arterial: 80 mmHg (ref 80.0–100.0)

## 2011-01-02 ENCOUNTER — Emergency Department (HOSPITAL_COMMUNITY)
Admission: EM | Admit: 2011-01-02 | Discharge: 2011-01-02 | Disposition: A | Discharge status: Home / Self Care | Payer: 59 | Attending: Emergency Medicine | Admitting: Emergency Medicine

## 2011-01-02 ENCOUNTER — Emergency Department (HOSPITAL_COMMUNITY): Payer: 59

## 2011-01-02 DIAGNOSIS — R05 Cough: Secondary | ICD-10-CM | POA: Insufficient documentation

## 2011-01-02 DIAGNOSIS — K5090 Crohn's disease, unspecified, without complications: Secondary | ICD-10-CM | POA: Insufficient documentation

## 2011-01-02 DIAGNOSIS — R109 Unspecified abdominal pain: Secondary | ICD-10-CM | POA: Insufficient documentation

## 2011-01-02 DIAGNOSIS — K5730 Diverticulosis of large intestine without perforation or abscess without bleeding: Secondary | ICD-10-CM | POA: Insufficient documentation

## 2011-01-02 DIAGNOSIS — R059 Cough, unspecified: Secondary | ICD-10-CM | POA: Insufficient documentation

## 2011-01-02 LAB — DIFFERENTIAL
Basophils Relative: 1 % (ref 0–1)
Eosinophils Absolute: 0.3 10*3/uL (ref 0.0–0.7)
Eosinophils Relative: 7 % — ABNORMAL HIGH (ref 0–5)
Lymphs Abs: 1.5 10*3/uL (ref 0.7–4.0)
Monocytes Absolute: 0.7 10*3/uL (ref 0.1–1.0)
Monocytes Relative: 16 % — ABNORMAL HIGH (ref 3–12)
Neutrophils Relative %: 42 % — ABNORMAL LOW (ref 43–77)

## 2011-01-02 LAB — COMPREHENSIVE METABOLIC PANEL
ALT: 34 U/L (ref 0–35)
AST: 33 U/L (ref 0–37)
Albumin: 3.9 g/dL (ref 3.5–5.2)
Alkaline Phosphatase: 55 U/L (ref 39–117)
BUN: 11 mg/dL (ref 6–23)
Chloride: 103 mEq/L (ref 96–112)
Potassium: 4 mEq/L (ref 3.5–5.1)
Sodium: 140 mEq/L (ref 135–145)
Total Bilirubin: 0.5 mg/dL (ref 0.3–1.2)
Total Protein: 7.5 g/dL (ref 6.0–8.3)

## 2011-01-02 LAB — URINALYSIS, ROUTINE W REFLEX MICROSCOPIC
Bilirubin Urine: NEGATIVE
Ketones, ur: NEGATIVE mg/dL
Nitrite: NEGATIVE
Protein, ur: NEGATIVE mg/dL
pH: 7 (ref 5.0–8.0)

## 2011-01-02 LAB — CBC
MCH: 27.9 pg (ref 26.0–34.0)
MCHC: 32.5 g/dL (ref 30.0–36.0)
MCV: 85.7 fL (ref 78.0–100.0)
Platelets: 335 10*3/uL (ref 150–400)
RDW: 14.3 % (ref 11.5–15.5)

## 2011-01-02 LAB — POCT CARDIAC MARKERS: Troponin i, poc: <0.05 ng/mL (ref 0.00–0.09)

## 2011-01-02 MED ORDER — IOHEXOL 300 MG/ML  SOLN: 100.0000 mL | Freq: Once | INTRAMUSCULAR | Status: DC | PRN

## 2011-01-06 ENCOUNTER — Ambulatory Visit (INDEPENDENT_AMBULATORY_CARE_PROVIDER_SITE_OTHER): Payer: 59 | Admitting: Internal Medicine

## 2011-01-06 DIAGNOSIS — K5090 Crohn's disease, unspecified, without complications: Secondary | ICD-10-CM

## 2011-01-07 LAB — BLOOD GAS, ARTERIAL
Acid-base deficit: 0.1 mmol/L (ref 0.0–2.0)
O2 Saturation: 96.8 %
TCO2: 21.5 mmol/L (ref 0–100)
pCO2 arterial: 39.7 mmHg (ref 35.0–45.0)

## 2011-01-08 LAB — COMPREHENSIVE METABOLIC PANEL
AST: 19 U/L (ref 0–37)
Albumin: 4 g/dL (ref 3.5–5.2)
BUN: 13 mg/dL (ref 6–23)
Calcium: 9.3 mg/dL (ref 8.4–10.5)
Creatinine, Ser: 0.92 mg/dL (ref 0.4–1.2)
GFR calc Af Amer: >60 mL/min (ref >60)
Total Protein: 7.3 g/dL (ref 6.0–8.3)

## 2011-01-08 LAB — DIFFERENTIAL
Basophils Absolute: 0 10*3/uL (ref 0.0–0.1)
Eosinophils Relative: 2 % (ref 0–5)
Lymphocytes Relative: 23 % (ref 12–46)
Lymphs Abs: 1.5 10*3/uL (ref 0.7–4.0)
Monocytes Absolute: 0.5 10*3/uL (ref 0.1–1.0)
Monocytes Relative: 7 % (ref 3–12)
Neutro Abs: 4.4 10*3/uL (ref 1.7–7.7)

## 2011-01-08 LAB — APTT: aPTT: 30 seconds (ref 24–37)

## 2011-01-08 LAB — POCT CARDIAC MARKERS
Myoglobin, poc: 30.6 ng/mL (ref 12–200)
Myoglobin, poc: 52.2 ng/mL (ref 12–200)

## 2011-01-08 LAB — TSH: TSH: 0.592 u[IU]/mL (ref 0.350–4.500)

## 2011-01-08 LAB — CBC
HCT: 38 % (ref 36.0–46.0)
MCV: 87.1 fL (ref 78.0–100.0)
Platelets: 319 10*3/uL (ref 150–400)
RDW: 14.7 % (ref 11.5–15.5)
WBC: 6.5 10*3/uL (ref 4.0–10.5)

## 2011-01-08 LAB — PROTIME-INR: Prothrombin Time: 12.3 seconds (ref 11.6–15.2)

## 2011-01-08 LAB — D-DIMER, QUANTITATIVE: D-Dimer, Quant: 0.36 ug/mL-FEU (ref 0.00–0.48)

## 2011-01-19 LAB — CBC
HCT: 36.1 % (ref 36.0–46.0)
Hemoglobin: 12.1 g/dL (ref 12.0–15.0)
MCHC: 33.6 g/dL (ref 30.0–36.0)
MCV: 87.5 fL (ref 78.0–100.0)
RBC: 4.13 MIL/uL (ref 3.87–5.11)
RDW: 14.6 % (ref 11.5–15.5)

## 2011-01-19 LAB — COMPREHENSIVE METABOLIC PANEL
ALT: 14 U/L (ref 0–35)
BUN: 8 mg/dL (ref 6–23)
CO2: 27 mEq/L (ref 19–32)
Calcium: 9.1 mg/dL (ref 8.4–10.5)
GFR calc non Af Amer: >60 mL/min (ref >60)
Glucose, Bld: 96 mg/dL (ref 70–99)
Sodium: 136 mEq/L (ref 135–145)
Total Protein: 6.5 g/dL (ref 6.0–8.3)

## 2011-01-19 LAB — DIFFERENTIAL
Basophils Relative: 0 % (ref 0–1)
Eosinophils Absolute: 0.1 10*3/uL (ref 0.0–0.7)
Lymphs Abs: 1.3 10*3/uL (ref 0.7–4.0)
Neutro Abs: 4.7 10*3/uL (ref 1.7–7.7)
Neutrophils Relative %: 72 % (ref 43–77)

## 2011-01-19 LAB — LIPASE, BLOOD: Lipase: 19 U/L (ref 11–59)

## 2011-01-19 LAB — PREGNANCY, URINE: Preg Test, Ur: NEGATIVE

## 2011-01-19 LAB — URINALYSIS, ROUTINE W REFLEX MICROSCOPIC
Bilirubin Urine: NEGATIVE
Glucose, UA: NEGATIVE mg/dL
Hgb urine dipstick: NEGATIVE
Ketones, ur: NEGATIVE mg/dL
Nitrite: NEGATIVE
Specific Gravity, Urine: 1.015 (ref 1.005–1.030)
pH: 7 (ref 5.0–8.0)

## 2011-01-19 NOTE — Consult Note (Signed)
[name], [name]              ACCOUNT NO.:  [id number]  MEDICAL RECORD NO.:  [id number]           PATIENT TYPE: AMB>  LOCATION: [place].                   [facility]  PHYSICIAN:  Hildred Laser, M.D.    DATE OF BIRTH:  [birth year]  DATE : 01/07/2011.                                OFFICE VISIT.   This is an office visit for follow up of Crohn's.  HISTORY OF PRESENT ILLNESS:  [name] is a 45 year old female presenting today for follow up of her Crohn's.  She was seen in our office in November 2011 and scheduled for colonoscopy for complaints of persistent postprandial diarrhea.  She had actually had this diarrhea since 2007.  She complained of mucus in her stool.  She underwent a colonoscopy in December 2011, which revealed scattered diverticula throughout the colon.  No evidence of endoscopic colitis.  She also underwent a small bowel follow through in January 2012, which revealed no evidence of small bowel obstruction or dilatation.  She had segmental wall thickening of the terminal ileum, question Crohn disease versus infectious etiology.  She was started on Entocort by Dr. Laural Golden, she is down to 3 mg a day, which she will be on for 2 weeks.  She states she is having 3-5 stools a day.  [name] states that some stools are formed and some are not.  Her stool consistency depends on what she eats.  She was seen in the emergency department last week at [facility] for cough and congestion.  Her CBC was normal.  Hemoglobin was 13.3, hematocrit was 40.9, her WBC was 4.3.  Her lipase 23.  Urinalysis was negative. She also underwent a CT of the abdomen and pelvis with contrast, which revealed no acute abdominal abnormalities, diverticulosis without acute inflammation.  She underwent a chest x-ray also, which revealed no active cardiopulmonary disease.  She does complain a day of bloating and tightness in her abdomen.  She states she had been on a bland diet.   She does have a history of IBS and Crohn's.  She says her appetite is okay. Today, she is not having any abdominal pain.  There has been no weight loss.  She does have a cough today.  She is allergic to IVP DYE and DILAUDID.  HOME MEDICATIONS: 1. Levsin as needed. 2. Celebrex 200 mg as needed. 3. She is on Nexium 40 mg 1 a day. 4. Entocort 3 mg x3 weeks. 5. Tensilon 100 mg twice a day, which was given in the emergency     department. 6. Cyclobenzaprine 10 mg as needed. 7. HyoMax 0.125 p.r.n.  OBJECTIVE:  VITAL SIGNS:  She 6 feet.  Her weight is 256.  Her blood pressure is 120/84, her temperature is 98.6. HEENT:  She has natural teeth.  Her oral mucosa is moist.  Her conjunctivae is pink.  Her sclerae are anicteric.  Her thyroid is normal. LUNGS:  Clear. HEART:  Regular rate and rhythm. ABDOMEN:  Soft.  Bowel sounds are positive.  No masses.  ASSESSMENT:  [name] is a 44 year old female with a history of Crohn's.  She also complained of some epigastric pain, which is  now resolved, but she does complain of bloating.  RECOMMENDATIONS:  She will continue her Nexium and her Entocort.  She will follow up with Dr. Laural Golden in 3 months.  She may call with a progress report in 2 weeks.    ______________________________ Deberah Castle, NP   ______________________________ Hildred Laser, M.D.    TS/MEDQ  D:  01/07/2011  T:  01/07/2011  Job:  [id number]  Electronically Signed by Deberah Castle PA on 01/08/2011 03:37:38 PM Electronically Signed by Hildred Laser M.D. on 01/19/2011 11:59:15 AM

## 2011-02-17 NOTE — H&P (Signed)
NAMEFRIDA, WAHLSTROM              ACCOUNT NO.:  [id number]   MEDICAL RECORD NO.:  [id number]          PATIENT TYPE:  AMB   LOCATION:  DAY                           FACILITY:  [facility]   PHYSICIAN:  R. Garfield Cornea, M.D. DATE OF BIRTH:  [birth year]   DATE OF ADMISSION:  DATE OF DISCHARGE:  LH                              HISTORY & PHYSICAL   ADDENDUM   I discussed Ms. Rhein's symptoms and physical findings with Dr. Gala Romney  as far as her right lower quadrant bulge, possible hernia is concerned.  I then had Dr. Kris Hartmann, radiologist at [facility] review her Feb 22, 2008, contrast CT of her pelvis to look for spigelian hernia versus  adipose.  He did review the films and commented there was a small defect  in the abdominal wall.  This is at the site of previous surgery.  She  did seem to have more prominent adipose at that point.  Given these  findings, he suggest that we send Ms. [name] for surgical consultation  initially to avoid further irradiation; however, if we did want to  confirm findings, we could proceed with a contrast CT of the pelvis with  markings at the site of her bulge to determine if in fact this is  definitely a hernia.  I did discuss this with Ms. Frechette and she has  previously seen Dr. Arnoldo Morale and would like to be evaluated by him.   As far as her melena is concerned, we will go ahead and get her set up  with an EGD with Dr. Gala Romney in the near future and all risks and benefits  have been discussed and she does agree with this plan and consent will  be obtained.      Vickey Huger, N.P.      [name], M.D.  Electronically Signed    KJ/MEDQ  D:  10/11/2008  T:  10/11/2008  Job:  [id number]   cc:   Halford Chessman, M.D.  Fax: [phone]   Jamesetta So, M.D.  Fax: [phone]

## 2011-02-17 NOTE — H&P (Signed)
NAMENANNIE, STARZYK              ACCOUNT NO.:  [id number]   MEDICAL RECORD NO.:  [id number]          PATIENT TYPE:  INP   LOCATION:  3028                         FACILITY:  [place]   PHYSICIAN:  Princess Bruins. Hickling, M.D.DATE OF BIRTH:  10-Feb-1966   DATE OF ADMISSION:  05/28/2007  DATE OF DISCHARGE:  05/29/2007                              HISTORY & PHYSICAL   CHIEF COMPLAINT:  Tingling left leg greater than arm, weakness left leg  greater than arm.   HISTORY OF PRESENT ILLNESS:  Morbidly obese 44 year old mother of four  with paresthetic pains radiating down her left leg intermittently  throughout the day.  Period was associated with weakness.  Tonight at 8  p.m. she was at church.  She tried to get up and felt that she could not  bear weight on her leg.  She had to be helped out of the church.   She complained of cramping pain in her left calf tonight exacerbated by  exertion.  She also has numbness that proceeds distally from the  brachial and popliteal fossa.  She also complains of numbness in the  left side of her body.   PAST MEDICAL HISTORY:  1. Chronic abdominal pain and pelvic pain with multiple GI procedures      and hysterectomy.  2. Pneumonia and asthma hospitalized February 4 through December 18, 2006.  3. Hypokinesis inferior wall on echocardiogram with a normal cardiac      catheterization in March 2005.  Complaints at that time of chest      pains.  She continues to complain of chest pains intermittently.  4. Diarrhea.  The patient has hand colonic diverticula noted on      colonoscopy August 05, 2006 with normal mucosa.  Gastroesophageal      duodenoscopy in November 2007 showed a single duodenal erosion.  It      was suspected on the basis of endoscopy that she had multiple      gastric erosions in her pyloric channel just one week before.  5. Headaches, possible migraines.  6. Motility disorder within the lower esophagus with gastroesophageal      reflux  disease.  7. Urinary tract infection diagnosed earlier this week treated with      Rocephin and Keflex.   PAST SURGICAL HISTORY:  Endoscopy, colonoscopy gastroesophageal  duodenoscopy, hysterectomy, bilateral salpingo-oophorectomy in 2005,  laparoscopic cholecystectomy for cholecystitis and biliary colic in  December 5643, inferior acromioplasty, and distal clavicular excision  right shoulder January 2002.   MEDICATIONS:  1. Keflex dose unknown.  2. As needed Zyrtec.  3. In the past she has also been on Protonix.   ALLERGIES TO MEDICINES:  None.   FAMILY HISTORY:  Father has lupus and mesothelioma.  Multiple members of  family with hypertension, diabetes, and congestive heart failure.  Her  brothers are healthy.  Her children have reactive airways disease.  No  history of stroke or myocardial infarction.   SOCIAL HISTORY:  She is a respiratory therapist at [employer].  Her  husband works as a Medical illustrator.  She does not use tobacco,  alcohol or drugs.  She has four children ages 47 down to 4.  Other than  her health she denies other stress factors at this time.   REVIEW OF SYSTEMS:  Abdominal pain postprandial, no weight loss, normal  sleep patterns.  ENT:  No complaints.  LUNGS:  See Past Medical History.  HEART:  Past Medical History.  ABDOMEN:  Past Medical History.  RENAL:  The patient says she has urinary retention and decreased output.  Her  urinalysis is entirely normal today.  GYNECOLOGIC:  See Past Medical  History.  MUSCULOSKELETAL:  Past Medical History and History of Present  Illness.  PSYCHIATRIC:  Anxiety.  ENDOCRINE:  Glucose tolerance test  abnormal by history.  ALLERGIES:  Allergic rhinitis.  A 12-system review  is otherwise negative.   PHYSICAL EXAMINATION:  VITAL SIGNS:  Temperature 98.l, pulse 76,  respirations 20, blood pressure 128/85, oxygen saturation 100% percent  on room.  HEENT:  No infections, bruits, meningismus.  LUNGS:  Clear.  HEART:  No  murmurs.  Pulses normal.  ABDOMEN:  Soft.  Bowel sounds normal.  No hepatosplenomegaly.  EXTREMITIES:  Cramping in the left calf with exertion.  I was unable to  palpate any cramp up and down her leg during that time despite the fact  that she was howling in pain.  Negative straight leg raising to 90  degrees.  NEUROLOGIC:  Awake, alert, no dysphasia or dyspraxia.  Cranial nerves:  Round reactive pupils.  Visual fields full, extraocular movements full.  Symmetric facial strength.  She has facial numbness which splits the  midline, tuning fork is perceived greater in the right forehead than the  left.  Motor examination:  Normal strength, tone, and mass.  Good fine  motor movements on the right, no drift, giveaway strength and slight  drift in the left arm, giveaway strength in the left deltoid and slight  drift to the left arm; otherwise, normal strength.  Inconsistent fine  motor movements.  At one point in the examination she wiggled the  fingers on the left side while I was taking her history.  When I asked  her to oppose her thumb with her fingers she did so hesitantly and with  no tremor.  She had intermittent functional tremor of the right and left  arm.  In the left lower extremity when I propped her leg up, she  had  normal strength in the knee flexors, extensors, foot dorsiflexors, and  plantar flexors.  She has giveaway strength in the hip flexor.  I did  not test the second time because of the cramps that she complained of  the first time.  She wiggles her toes better on the right side than left  side.  Sensory examination splits the midline.  She has a stocking  hypesthesia to cold normal proprioception, vibration, stereoagnosis.  Cerebellar examination:  Good finger-to-nose bilaterally.  Gait:  Antalgic.  She holds her left leg away from her while walking.  Deep  tendon reflexes are absent.  She had bilateral flexor plantar responses.   IMPRESSION:  1. Numbness left side of  her body, 782.0.  2. Functional weakness 342.82.  3. Low back pain radiating down her left leg with negative straight      leg raising, normal sensation.  4. Possible peripheral polyneuropathy.   DISCUSSION:  I believe that this is a functional examination.  The  patient has not had a stroke on the left.  I am going to perform an MRI  scan of the brain, cervical spine, lumbosacral spine to look for  demyelinating plaques and to make certain that she does not have  lumbosacral spondylosis.  We will involve PT and OT.  We may need  psychiatric consultation.  This will depend on how well she improves on  her own.      Princess Bruins. Gaynell Face, M.D.  Electronically Signed     WHH/MEDQ  D:  05/28/2007  T:  05/29/2007  Job:  [id number]   cc:   Sherrilee Gilles. Gerarda Fraction, MD

## 2011-02-17 NOTE — H&P (Signed)
NAMEJAKEIRA, SEEMAN              ACCOUNT NO.:  [id number]   MEDICAL RECORD NO.:  [id number]          PATIENT TYPE:  AMB   LOCATION:  DAY                           FACILITY:  [facility]   PHYSICIAN:  Jamesetta So, M.D.  DATE OF BIRTH:  07/02/1966   DATE OF ADMISSION:  10/12/2008  DATE OF DISCHARGE:  01/08/2010LH                              HISTORY & PHYSICAL   CHIEF COMPLAINT:  Right inguinal hernia.   HISTORY OF PRESENT ILLNESS:  The patient is a 45 year old black female  who is referred for evaluation and treatment of a right lower quadrant  swelling.  It is made worse with standing or straining.  No nausea or  vomiting have been noted.   PAST MEDICAL HISTORY:  Hiatal hernia.   PAST SURGICAL HISTORY:  Hysterectomy, shoulder surgery x2, laparoscopic  cholecystectomy in 2007, cardiac catheterization in the past.   CURRENT MEDICATIONS:  Protonix, Bentyl p.r.n.   ALLERGIES:  IV DYE, DILAUDID.   REVIEW OF SYSTEMS:  The patient denies drinking or smoking.  She denies  any other cardiopulmonary difficulties or bleeding disorders.   PHYSICAL EXAMINATION:  GENERAL:  The patient is overweight black female  in no acute distress.  LUNGS:  Clear to auscultation with equal breath sounds bilaterally.  HEART:  Regular rate and rhythm without S3, S4, murmurs.  ABDOMEN:  Soft and nondistended.  She is tender in the right lower  portion of the abdomen over a hernia that seems to be in the right  inguinal region laterally.  No hepatosplenomegaly or masses are noted.   IMPRESSION:  Right inguinal hernia.   PLAN:  The patient is scheduled for a right inguinal herniorrhaphy on  October 23, 2008.  The risks and benefits of the procedure including  bleeding, infection, pain, and recurrence of the hernia were fully  explained to the patient, gave informed consent.      Jamesetta So, M.D.  Electronically Signed     MAJ/MEDQ  D:  10/18/2008  T:  10/19/2008  Job:  [id number]   cc:   R.  Garfield Cornea, M.D.  [address]  [place]  [place] [address]   Halford Chessman, M.D.  Fax: [phone]   Short-Stay at [facility]

## 2011-02-17 NOTE — Discharge Summary (Signed)
[name], [name]              ACCOUNT NO.:  [id number]   MEDICAL RECORD NO.:  [id number]          PATIENT TYPE:  INP   LOCATION:  3028                         FACILITY:  [place]   PHYSICIAN:  Princess Bruins. Hickling, M.D.DATE OF BIRTH:  [birth year]   DATE OF ADMISSION:  05/28/2007  DATE OF DISCHARGE:  05/29/2007                               DISCHARGE SUMMARY   FINAL DIAGNOSES:  1. Left lumbar spondylosis (L5-S1).  2. Hysterical conversion reaction.   PROCEDURES:  MRI brain and cervical spine MRI, lumbosacral spine.   COMPLICATIONS:  None.   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  She worked the entire  day, went to church, and while at church complained that she was unable  to bear weight on her leg.  She presented to the [facility] Emergency  Room and was evaluated.  The emergency room doctor thought that she had  a stroke, because she was unable to show full power in her mild left  upper extremity and showed very marked weakness in her left lower  extremity, despite the fact that she was able to get her up and she  could bear weight to some extent to walk.   I thought that this was a functional examination, and on evaluation when  she was admitted to [facility], she showed a functional  examination.  This was a demonstrated several ways:  1. The numbness that she had split the midline.  2. Tuning fork placed on the left side of her head was less      prominently noted than the right side, which is a functional      finding.  3. The patient showed very good fine motor skills in the left hand      while she was in the middle the history, and when I formally tested      it, it showed great clumsiness.  4. There is evidence of give away strength in her left deltoid and      also in her left hip flexor.  Indeed, she  complained bitterly of      pain with cramping in her leg during this time.  Upon palpation of      the muscle, there was no cramp present.  When I had her prop the      leg so that she was not exerting her hip flexor against gravity,      she showed excellent strength in all motor areas.  The patient also      had functional tremor of her extremities.  She wiggled her toes      more on the right foot than the left foot.  She had no evidence of      long tract signs, in terms of changes in reflexes or extensor      plantar responses.   I felt that she deserved an MRI scan of the brain and cervical spine, to  look for causes that  could cause issues other than stroke, such as  demyelinating disease.  Her MRI of the brain was entirely normal.  MRI  of the cervical spine was normal, other than a very tiny disk protrusion  to left of midline at C5-6, which is not impinging upon nerve roots of  the cord.   I also performed lumbosacral MRI spinal films, because of concerns of  radicular pain and weakness.   This showed disk degeneration with bulging disks and facet arthropathy  at L3-4 without compressions.  At L4-5, she had a small central and left-  sided disk protrusion exiting into the left foramen, without evidence of  nerve root compression.  At L5-S1, she had disk degeneration spondylosis  and bilateral vertebral foraminal spurring, left greater than right,  with left foraminal encroachment and nerve root impingement.   EXAMINATION:  VITAL SIGNS:  Today on examination her temperature is  97.9, blood pressure 126/83, resting pulse 73, respirations 20, oxygen  saturation 94% on room air.  LUNGS:  Clear.  HEART:  No murmurs.  Pulses normal.  ABDOMEN:  Soft, protuberant.  Bowel sounds normal.  EXTREMITIES:  Normal.  NEUROLOGIC:  Patient is awake, alert.  CRANIAL NERVES:  Round reactive pupils.  Visual fields full.  Extraocular movements full.  Symmetric facial strength, midline  tongue.  MOTOR:  The patient shows normal strength in all extremities.  Fine  motor movements are now intact.  SENSORY:  Now intact.  GAIT:  Slightly antalgic when she walks.   She is discharged in improved condition.  She will take the same  medicines she took that on admission.  Fem-Ring weekly, Zyrtec daily as  needed, Keflex 500 mg four times a day until the bottle was empty.   Lab findings:  The only notable findings are a potassium of 3.7, which  is not significant, even in light of her cramping.  She had a normal  CBC, normal urinalysis, which is somewhat surprising given that she said  that she had a raging urinary tract infection, had been treated with  Rocephin, and then Keflex.  Her fasting glucose was minimally elevated  at 106.  She may have some glucose intolerance, based on her morbid  obesity.   The patient is discharged in improved condition.  She will return to see  Dr. Gerarda Fraction, her primary care physician in 2 to 4 weeks' time.  I would  recommend a conservative approach to this lumbar disk.  She could have a  neurosurgery consult, but I do not believe, based on her examination,  that a neurosurgeon was operate at this time.  Before, she should have  physical therapy at [facility], with the goal of strengthening  her abdominal and back muscles, to better support her, and #6, she needs  to lose weight.  Both of these will decrease the likelihood that she  needs surgery for her spondylosis.  As to the other functional portions  of her examination, I have told her they do not have an explanation, but  I have documented this for future physicians caring for the patient, so  that they can be aware that this existed.  I will see her on an as-  needed basis.      Princess Bruins. Gaynell Face, M.D.  Electronically Signed     WHH/MEDQ  D:  05/29/2007  T:  05/29/2007  Job:  [id number]   cc:   Sherrilee Gilles. Gerarda Fraction, MD

## 2011-02-17 NOTE — Op Note (Signed)
[name], [name]              ACCOUNT NO.:  [id number]   MEDICAL RECORD NO.:  [id number]          PATIENT TYPE:  AMB   LOCATION:  DAY                           FACILITY:  [facility]   PHYSICIAN:  R. Garfield Cornea, M.D. DATE OF BIRTH:  [birth year]   DATE OF PROCEDURE:  DATE OF DISCHARGE:                               OPERATIVE REPORT   INDICATIONS FOR PROCEDURE:  A 45 year old African American lady with  recent episode of melena.  She has had some intermittent constipation,  diarrhea, and bulging right lower quadrant which of retrospect look on  recent abdominal CT likely is incisional hernia evolution.  Lab work  back in November looked good including a CBC, amylase, lipase, and LFTs.  EGD is now being done to further investigate report history of melena.  This approach has discussed with the patient at length.  Risks,  benefits, alternatives, limitations had been discussed.  Please see the  documentation in medical record.   PROCEDURE NOTE:  O2 saturation, blood pressure, pulse, respiratory rate  monitored throughout the entire procedure.   CONSCIOUS SEDATION:  Versed 7 mg IV, Demerol 150 mg IV in divided doses.  Cetacaine spray for topical pharyngeal anesthesia.   INSTRUMENT:  Pentax video chip system.   FINDINGS:  Examination of tubular esophagus revealed a noncritical  Schatzki ring, esophageal mucosa otherwise appeared entirely normal.  EG  junction easily traversed into the stomach.  Colon:  Gastric cavity was  emptied, insufflated well with air.  Thorough examination of gastric  mucosa including retroflexion of proximal stomach esophagogastric  junction demonstrated only a small hiatal hernia.  Pylorus was patent,  easily traversed, the bulb, and second portion appeared normal.  Therapeutic/diagnostic maneuvers performed:  None.   The patient tolerated the procedure well and was reacted in Endoscopy.   IMPRESSION:  Noncritical Schatzki's ring, otherwise normal esophagus  ring not manipulated, small moderate size hiatal hernia, otherwise  normal stomach D1 and D2.   RECOMMENDATIONS:  1. Continue Aciphex 20 mg orally daily for gastroesophageal flux      disease.  Go ahead and keep her appointment to see Dr. Arnoldo Morale'      reference of possible incisional hernia.  2. Appointment to see [place] back in the office in 6 weeks.      [name], M.D.  Electronically Signed     RMR/MEDQ  D:  10/12/2008  T:  10/13/2008  Job:  [id number]   cc:   Jamesetta So, M.D.  Fax: [phone]

## 2011-02-17 NOTE — Assessment & Plan Note (Signed)
NAMEMarland Kitchen  JULIANI, LADUKE               CHART#:  [id number]   DATE:  08/29/2008                       DOB:  Feb 19, 1966   PRIMARY CARE PHYSICIAN:  Dr. Hilma Favors.   CHIEF COMPLAINT:  Abdominal bloating and lots of abdominal problems.   PROBLEM LIST:  1. Status post colon laparoscopic cholecystectomy for chronic      cholecystitis by Dr. Arnoldo Morale on 09/08/2006.  2. Chronic gastroesophageal reflux disease, chronic gastritis, non-      Helicobacter pylori gastritis, and gastroduodenitis with history of      abnormal pH study previously.  3. Generalized esophageal dysmotility disorder.  4. Small bowel Givens capsule study on 08/17/2006, gastric erosions of      the pyloric channel, several areas of erythema and erosive lesions      in the small bowel.  5. Asthma.  6. MR of the abdomen with and without contrast on January 22, 2006, tiny      splenic cyst, diverticulosis, and small periumbilical hernia  7. Irritable bowel syndrome.  8. Frequent urinary tract infections.  9. Mitral valve regurgitation.  10.Hysterectomy for fibroids.  11.Cesarean section x1.  12.Exploratory laparotomy.  13.Cardiac catheterization in 2005.  14.Endoscopic ultrasound by Dr. Jerene Pitch at [facility] on 12/07/2005, possible mild chronic      pancreatitis in the head of the pancreas.  15.Colonoscopy on 08/05/2006 by Dr. Gala Romney, pancolonic diverticulosis.   SUBJECTIVE:  The patient is a 45 year old African American female who  has had 3-week history of significant abdominal bloating.  She has had  some diarrhea and mucus in her stools.  At time, she has severe urgency,  other times she feels constipated.  She has transient intermittent  nausea usually worse nocturnally.  She is having some mid abdominal  pain, which radiates to her left upper quadrant as well as to her right  upper quadrant.  She was recently diagnosed with herpes zoster and has 2  lesions on her left flank area.   She has been off PPI for about 2 years,  now she is complaining of nocturnal water brash.  She is complaining of  abdominal bloating and tenderness.  She does not notice a difference of  milk products necessarily.  She has been on antibiotics recently for  UTIs.  Her weight has remained stable.  She really has not had much of  difference with her chronic abdominal pain since her gallbladder was  removed.  She denies any fever and has had some chills.  She is taking a  rare Motrin.   CURRENT MEDICATIONS:  See at the list from 08/29/2008.   ALLERGIES:  CT dye and Dilaudid.   FAMILY HISTORY:  There is no known family history of colorectal  carcinoma, liver or chronic GI problems.  Father age 41 has lupus and  sarcoidosis.  Mother age 28, has hypertension and diabetes mellitus.  She has 2 healthy brothers and a healthy half-sister.   SOCIAL HISTORY:  The patient is married.  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  She denies any tobacco, alcohol, or  drug use.   REVIEW OF SYSTEMS:  See HPI.  Genitourinary:  She has frequent urinary  tract infections and has microscopic hematuria.  She is being evaluated  by Dr.  Hilma Favors for urinary retention, otherwise negative review of  systems.   PHYSICAL EXAMINATION:  VITAL SIGNS:  Weight 244 pounds, height 73  inches, temperature 98.4, blood pressure 112/72, and pulse 52.  GENERAL:  She is an obese [place] American female who is alert,  oriented, pleasant, and cooperative, in no acute distress.  HEENT:  Sclerae clear, nonicteric.  Conjunctivae pink.  Oropharynx pink  and moist without any lesions.  NECK:  Supple without mass and thyromegaly.  CHEST:  Heart regular rate and rhythm.  Normal S1 and S2.  No murmurs,  clicks, rubs, or gallops.  LUNGS:  Clear to auscultation bilaterally.  ABDOMEN:  Protuberant with positive bowel sounds x4.  No bruits  auscultated.  Soft, nontender, nondistended without palpable mass or   hepatosplenomegaly.  No rebound, tenderness, or guarding.  She has a  negative Carnett sign.  EXTREMITIES:  Without clubbing or edema   ASSESSMENT:  The patient is a 45 year old African American female who  has transient nausea, nocturnal gastroesophageal reflux disease  symptoms, and dyspepsia.  I suspect this could all be related to her  gastroesophageal reflux disease and she needs to be on proton pump  inhibitor, has not been on proton pump inhibitor for almost 2 years now.  She is most likely going to need colonic therapy.   As far as her urgency and alteration between constipation and diarrhea,  I suspect this is irritable bowel syndrome at this point.   PLAN:  1. We will check CBC, LFTs, amylase, and lipase.  2. Digestive Advantage once daily.  I have given her samples.  3. Levsin 0.125 mg a.c. and nightly p.r.n. diarrhea and abdominal      pain, #60 with 1 refill.  4. Aciphex 20 mg daily.  I have given her 2 weeks' worth of samples      and prescription for 31      with 11 refills.  5. Gas bloat literature given for her review.       Vickey Huger, N.P.  Electronically Signed     R. Garfield Cornea, M.D.  Electronically Signed    KJ/MEDQ  D:  08/29/2008  T:  08/29/2008  Job:  [id number]

## 2011-02-17 NOTE — Op Note (Signed)
[name], [name]              ACCOUNT NO.:  [id number]   MEDICAL RECORD NO.:  [id number]          PATIENT TYPE:  AMB   LOCATION:  DAY                           FACILITY:  [facility]   PHYSICIAN:  Jamesetta So, M.D.  DATE OF BIRTH:  [birth year]   DATE OF PROCEDURE:  DATE OF DISCHARGE:                               OPERATIVE REPORT   PREOPERATIVE DIAGNOSIS:  Right inguinal hernia.   POSTOPERATIVE DIAGNOSIS:  Right inguinal hernia, direct and indirect.   PROCEDURE:  Right inguinal herniorrhaphy.   SURGEON:  Jamesetta So, MD   ANESTHESIA:  General.   INDICATIONS:  The patient is a 45 year old black female who is referred  for evaluation for treatment of right lower quadrant pain and swelling.  It appears to be a right inguinal hernia.  The risks and benefits of the  procedure including bleeding, infection, pain, and the possibly of  recurrence of the hernia were fully explained to the patient, who gave  informed consent.   PROCEDURE NOTE:  The patient was placed in the supine position.  After  general anesthesia was administered, the right groin region was prepped  and draped using the usual sterile technique with DuraPrep.  Surgical  site confirmation was performed.   A transverse incision was made in the right groin region down to the  external oblique aponeurosis.  The aponeurosis was incised to the  external ring.  The patient was noted to have a large amount of  properitoneal fat herniating around with an indirect hernia sac.  The  patient was also noted to have a direct hernia.  The excess  properitoneal fat was excised using the LigaSure.  The hernia sac was  freed away from the surrounding tissue up to the peritoneal reflection  and inverted.  The tendinous sheath in this region was divided and  ligated using 3-0 silk ties.  A large-size PerFix Bard plug was then  placed into this region and secured circumferentially to the  transversalis fascia using 2-0 Novofil  interrupted sutures.  An onlay  patch was then placed along the floor of the inguinal canal and secured  superiorly to the conjoined tendon and inferiorly the shelving edge of  the Poupart ligament using 2-0 Novofil interrupted sutures.  The  external oblique aponeurosis was reapproximated using a 2-0 Vicryl  running suture.  The subcutaneous layer was reapproximated using a 3-0  Vicryl interrupted suture.  Skin was closed using a 4-0 Vicryl  subcuticular suture.  Sensorcaine 0.5% was instilled into the  surrounding wound.  Dermabond was then applied.   All tape and needle counts were correct at the end of the procedure.  The patient was awakened and transferred to PACU in stable condition.   COMPLICATIONS:  None.   SPECIMEN:  None.   BLOOD LOSS:  Minimal.      Jamesetta So, M.D.  Electronically Signed     MAJ/MEDQ  D:  10/30/2008  T:  10/31/2008  Job:  [id number]   cc:   R. Garfield Cornea, M.D.  [address]  [place]  [place] [address]  Halford Chessman, M.D.  Fax: [phone]

## 2011-02-17 NOTE — Assessment & Plan Note (Signed)
NAMEMarland Kitchen  [name], [name]               CHART#:  [id number]   DATE:  10/10/2008                       DOB:  03/28/1966   PRIMARY CARE PHYSICIAN:  Halford Chessman, MD   CHIEF COMPLAINT:  Abdominal pain.   PROBLEM LIST:  1. Chronic gastroesophageal reflux disease and non Helicobacter pylori      gastritis with history of abnormal pH study previously.  2. Last EGD on 08/24/2006, by Dr. Laural Golden, gastroduodenitis with single      erosion of antrum on erythematous mucosa, focal bulbar duodenitis.  3. Generalized esophageal motility disorder.  4. Several areas of erythema and erosive lesions in the small bowel      and gastric erosions of the pyloric channel on small-bowel Givens      capsule study on 08/17/2006.  5. Status post lap chole for chronic cholecystitis in 2007.  6. Small periumbilical hernia.  7. Irritable bowel syndrome.  8. Diverticulosis.  9. Mitral valve regurgitation.  10.Hysterectomy for uterine fibroids.  11.Exploratory laparotomy.  12.EUS by Dr. Jerene Pitch at [facility]      in March 2007 with possible mild chronic pancreatitis in the head      of the pancreas.  13.Last colonoscopy on 08/05/2006, by Dr. Gala Romney, which showed      diverticulosis.  14.Cardiac catheterization, benign in 2005.  15.MR of the abdomen with and without contrast on January 22, 2006, tiny      splenic cyst, diverticulosis, and small periumbilical hernia.  She had a CT of the abdomen and pelvis with and without contrast in May  2009, which was negative.   SUBJECTIVE:  The patient is a 45 year old African American female who  woke up 5 mornings ago with severe abdominal pain while getting ready  for work.  She felt as though her entire abdomen was bowling up.  It was  somewhat better with resting.  She has chronic right upper quadrant  pain, which feels like a deep pain.  She also notices a bulge that comes  from her right lower quadrant.  She complains of nausea.  She  has not  had any vomiting.  She denies any fever or chills.  She tells me that  day, she also had a very dark tarry stool, which she thought was melena.  She was seen in the emergency room.  Her hemoglobin was 12.1.  She was  given some morphine and Phenergan and she tells me this did seem to help  her pain some.  Her pain is usually worse first thing in the morning.  It is worse when she bends over.  She had labs in the ER to include CBC,  which was normal.  Acute abdominal films, which is negative.  Lipase and  LFTs normal.  Her bowels had generally been more constipated recently  and she has had small amounts of stool.   CURRENT MEDICATIONS:  Motrin 200 mg p.r.n., Tylenol 500 mg p.r.n.,  dicyclomine 10 mg a.c. and nightly p.r.n., ciprofloxacin 500 mg b.i.d.,  belladonna 0.1 mg q. 3 days, Aciphex 20 mg daily, Digestive Advantage  daily, Levsin 0.125 mg a.c. and nightly p.r.n.   ALLERGIES:  CT dye and Dilaudid.   PAST MEDICAL AND SURGICAL HISTORY:  See problem list.   FAMILY HISTORY:  No known family history of  colorectal carcinoma, liver  or chronic GI problems.  Father age 64 has lupus and sarcoidosis.  Mother age 50 and has hypertension and diabetes mellitus.  She has 2  healthy brothers and a healthy half-sister.   SOCIAL HISTORY:  She is married.  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  She denies any tobacco, alcohol, or drug use.   REVIEW OF SYSTEMS:  See HPI, otherwise negative review of systems.   PHYSICAL EXAMINATION:  VITAL SIGNS:  Weight 242 pounds, height 72  inches, temperature 79.9, blood pressure 120/82, and pulse 72.  GENERAL:  She is an obese African female who is alert, oriented,  pleasant, and cooperative, in no acute distress.  HEENT:  Sclerae clear, nonicteric.  Conjunctivae pink.  Oropharynx pink  and moist without any lesions.  NECK:  Supple without mass or thyromegaly.  CHEST:  Heart regular rate and rhythm.  Normal S1 and S2  without any  murmur, clicks, rubs, or gallops.  LUNGS:  Clear to auscultation bilaterally.  ABDOMEN:  Protuberant with positive bowel sounds x4.  No bruits  auscultated.  Abdomen is soft, mildly distended.  She does have a  palpable firmness to the right lower quadrant on deep palpation, which  is asymmetric while supine.  While laying flat, this bulge tends to  disappear.  There is no rebound, tenderness, or guarding.  Unable to  palpate hepatosplenomegaly or mass.  Exam is limited given the patient's  body habitus.  EXTREMITIES:  Without clubbing or edema.   ASSESSMENT:  The patient is a 45 year old African American female with  several gastrointestinal concerns.  The first being that of melena,  which is going to require further evaluation with the EGD to rule out  peptic ulcer disease.  The second concern is constipation and this may  be contributing to her right-sided abdominal pain.  The third concern is  her right lower quadrant bulge with pain and I suspect that she could  have a spigelian hernia.  I have discussed this case with Dr. Gala Romney and  the plan of care as outlined below.   PLAN:  1. We will or give her Aciphex 20 mg daily samples.  2. Begin MiraLax 17 g daily as needed for constipation.  3. I will have Dr. Hilma Favors to review her May 2009 CT scan to look for      spigelian hernia.  If we it cannot be seen on CT, we will discuss      further imaging with him, less likely this area could be scar      tissue or excess adipose.  4. EGD with Dr. Gala Romney in the near future.  Discussed procedure      including risks and benefits, which included but are not limited to      bleeding, infection, perforation, and drug reaction.  She agrees      with thia plan and consent will be obtained.       Vickey Huger, N.P.  Electronically Signed     R. Garfield Cornea, M.D.  Electronically Signed    KJ/MEDQ  D:  10/10/2008  T:  10/10/2008  Job:  [id number]   cc:   Halford Chessman,  M.D.

## 2011-02-20 NOTE — Op Note (Signed)
[place]  Patient:    [name], [name]                       MRN: [id number] Proc. Date: 10/22/00 Adm. Date:  [id number] Disc. Date: [id number] Attending:  Debroah Baller                           Operative Report  PREOPERATIVE DIAGNOSIS:  Recurrent right shoulder subacromial spur and distal clavicular spur.  POSTOPERATIVE DIAGNOSIS:  Recurrent right shoulder subacromial spur and distal clavicular spur.  OPERATION:  Right shoulder redo anterior inferior acromioplasty and distal clavicle excision.  SURGEON:  Debroah Baller, M.D.  ASSISTANT:    Liliane Channel, P.A.-C.  ANESTHESIA:  General endotracheal.  ESTIMATED BLOOD LOSS:  Minimal  FLUID REPLACEMENT:   800 cc of crystalloid.  DRAINS PLACED:  None.  TOURNIQUET TIME:  None.  INDICATIONS FOR PROCEDURE:  A 45 year old woman who underwent a right shoulder anterior inferior acromioplasty and distal clavicle excision by me about two years ago.  Over the last 18 to 19 months, she has regrown the subacromial spur and the distal clavicular spur and is now a candidate for redo decompression followed by treatment with Indocin to prevent heterotopic calcification.  The risks and benefits of surgery understood by the patient, and she would like to proceed.  She got good temporary relief from cortisone injection a few months ago.  DESCRIPTION OF PROCEDURE:  The patient was identified by arm band and taken to the operating room in [place].  Appropriate monitors were attached and general endotracheal anesthesia induced with the patient in supine position.  She was then placed in the beach chair position and the right upper extremity prepped and draped in the usual sterile fashion from the wrist to the hemithorax.  The skin along the anterior, lateral, and posterior aspects of the acromion process was infiltrated with 2 to 3 cc of 0.5% Marcaine and epinephrine solution.   Using a #11 blade, standard portals were then made 0.5 cm anterior to the [facility] joint, lateral to the junction of the posterior and middle thirds of the acromion, and posterior to the posterolateral corner of the acromion process.  The inflow was placed anteriorly, the arthroscope laterally, and a 4.2 mm Grey White sucker shaver from [employer] posterior allowing removal of the subacromial bursa and outlining of the subacromial spur which was recurrent.  This was then removed with a 4.5 mm hooded vortex bur exposing the distal clavicle, and likewise the distal centimeter of the clavicle was resected with a 4.5 mm hooded vortex bur.  At one point, we did bring the bur in anteriorly, the scope posteriorly, and the inflow laterally.  The scope was then repositioned in the glenohumeral joint with the inflow attached allowing diagnostic arthroscopy of the humeral head, glenoid, labrum, biceps tendon, rotator cuff complex which were all intact.  There was some inflammation of the rotator cuff supraspinatus which was underneath the subacromial spur.  The shoulder was then washed out with normal saline solution.  The arthroscopic instruments removed.  A dressing of Xeroform, 4 x 8 dressing, sponges, and taper tape applied.  The patient was then placed in a sling, awakened, and taken to the recovery room without difficulty. DD:  10/22/00 TD:  10/24/00 Job: [id number]

## 2011-02-20 NOTE — Consult Note (Signed)
[name], [name]              ACCOUNT NO.:  [id number]   MEDICAL RECORD NO.:  [id number]          PATIENT TYPE:  INP   LOCATION:  A310                          FACILITY:  [facility]   PHYSICIAN:  Caro Hight, M.D.      DATE OF BIRTH:  1965-10-11   DATE OF CONSULTATION:  08/02/2006  DATE OF DISCHARGE:                                   CONSULTATION   REFERRING PHYSICIAN:  Sherrilee Gilles. Gerarda Fraction, MD   REASON FOR CONSULTATION:  Abdominal pain.   HISTORY OF PRESENT ILLNESS:  [name] is a 45 year old female who has had  episodic abdominal pain and diarrhea since 2005.  She has had extensive  negative workup.  She was recently seen as an outpatient by Dr. Otelia Limes  nurse practitioner, [place].  She was told she probably had  irritable bowel syndrome.  [name] reports yesterday around 3 p.m. she  went out to eat.  She had fried fish, shrimp, oysters, and a baked potato at  [employer] in [place], [place]. She ate ice cream and then  around 8:30 or 9:30 p.m., she felt nauseous and pressure in her chest and  had crampy abdominal pain.  She began to hurt on her right side and her  lower back.  She began to shake and had subjective chills.  At 11 p.m., she  came to the emergency department because she had no relief at home.  She  tried Levsin sublingual x1 which did not help.  She had loose stool  continually from 9 p.m. to 5 a.m.  She received Dilaudid in the emergency  department and started itching and had vomiting.  At the time of my  interview, she said everything had calmed down.  Now she feels guts moving  around  She has pain in the lower back area.  Now she feels the urge to have  a bowel movement, but just air is coming out.  She denies every having  biopsy of her small intestines or colonoscopy.  She reports a 20-pound  weight loss.  Denies any blood in her stool.  In between episodes, she has  constipation.  She describes the constipation as having 2-3 bowel  movements  a week.  She denies consumption of any raw beef or chicken.  She did have  Cipro for a urinary tract infection recently.  She has had well water for  the last 5 years.  She has not had a bowel movement since being admitted.  She denies drinking out of any streams or ponds.  She traveled to [place] 7-8  years ago. She was on a cruise.  She feels better today.   PAST MEDICAL HISTORY:  Cardiac catheterization which was negative.   PAST SURGICAL HISTORY:  1. Two shoulder surgeries secondary to bone spurs.  2. Bilateral salpingo-oophorectomy and hysterectomy secondary to      endometriosis and fibroids.  3. Exploratory laparotomy x2.  4. D&C.   ALLERGIES:  No known drug allergies, but IV CONTRAST makes her have nausea  and dry heaves.   MEDICATIONS:  1. Protonix.  2. Tylenol as needed.  3. Xanax as needed.  4. Lomotil as needed.   FAMILY HISTORY:  She denies any family history of colon cancer, colon  polyps, or diarrheal illnesses.   SOCIAL HISTORY:  She is married for 15 years.  She has 4 children, ages 27,  58, 34, and 85.  She denies any tobacco or alcohol use.   REVIEW OF SYSTEMS:  As per the HPI, otherwise all systems are negative.   PHYSICAL EXAMINATION:  VITAL SIGNS:  She is afebrile, hemodynamically  stable.  GENERAL:  No apparent distress and alert and oriented x4.  HEENT:  Atraumatic and normocephalic.  Pupils equal, round, and reactive to  light.  Mouth without oral lesions. Posterior pharynx without erythema or  exudate.  LUNGS: Clear to auscultation bilaterally.  CARDIAC:  Regular rate and rhythm. No murmur.  Normal S1 and S2.  ABDOMEN: Bowel sounds are present.  Soft, mildly distended. Mild tenderness  to palpation in all four quadrants.  No rebound, no guarding.  EXTREMITIES:  Without cyanosis, clubbing, or edema.  NEUROLOGIC:  She has no focal neurologic deficits.   LABORATORY DATA:  White count 9.5, hemoglobin 14.4, platelets 382.  Potassium 3.4,  CO2 29, BUN 10, creatinine 1.  UA negative.  Albumin 4.6.  Pending stool culture and C. Difficile.   ASSESSMENT:  [name] is a 45 year old female with episodic abdominal  pain, nausea, loose stools since 2005 with acute exacerbation.  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  The  differential diagnoses include food-borne illness and low likelihood of  Clostridium difficile colitis.  Thank you for allowing me to see [name]  in consultation.  My recommendations follows.   RECOMMENDATIONS:  1. Agree with routine stool culture and C. difficile toxin.  2. Continue clear liquids and advance to low-fat, lactose-free diet as      tolerated.  3. Agree with celiac serologies and would add quantitative immunoglobulins      and tisssue trans-glutaminase IgA as well.  4. Would consider adding fiber supplementation and dicyclomine if it has      not been tried as an outpatient to her medication regimen.   SERVICE DATE:  August 02, 2006.      Caro Hight, M.D.  Electronically Signed     SM/MEDQ  D:  08/03/2006  T:  08/03/2006  Job:  [id number]

## 2011-02-20 NOTE — Op Note (Signed)
[name], BRASHEAR                        ACCOUNT NO.:  [id number]   MEDICAL RECORD NO.:  [id number]                   PATIENT TYPE:  AMB   LOCATION:  DAY                                  FACILITY:  [facility]   PHYSICIAN:  Hildred Laser, M.D.                 DATE OF BIRTH:  [birth year]   DATE OF PROCEDURE:  02/12/2004  DATE OF DISCHARGE:                                 OPERATIVE REPORT   PROCEDURE:  Esophagogastroduodenoscopy.   INDICATIONS:  Pev is 45 year old African American female with recurrent  episodes of chest pain described as crushing in tightness.  She has also had  some epigastric pain with nausea but no vomiting.  She has had cardiac  catheterization which was negative.  She has also had echocardiography.  She  had some mild mitral regurgitation but no evidence of mitral valve prolapse.  She also has undergone ultrasound and a hepatobiliary scan and no cause has  been found to explain her symptomatology.  She has been on a PPI for a few  weeks.  However, she has not taken it regularly.  She is undergoing  diagnostic EGD.  Procedure is reviewed with the patient and informed consent  was obtained.   PREMEDICATION:  Cetacaine spray for pharyngeal topical anesthesia, Demerol  25 mg IV, Versed 12 mg IV in divided doses.   FINDINGS:  The procedures were performed in the endoscopy suite.  The  patient's vital signs and O2 saturation were monitored during the procedure  and remained stable.   The patient was placed in the left lateral recumbent position and Olympus  video scope was passed through the oropharynx without any difficulty into  the esophagus.   Esophagus:  Mucosa of the esophagus was normal throughout.  There was focal  erythema at the Z-line on the gastric side.  She had 3-4 cm size sliding  hiatal hernia.  The GE junction was estimated to be at 37 cm.   Stomach:  It was empty and distended very well on insufflation.  Folds of  the proximal stomach were  normal.  Examination of the mucosa at the body was  normal but there was linear erythema at the antrum and prepyloric area.  Pyloric channel was patent.  Angularis, fundus and cardia were examined by  retroflexing the scope and were normal.   Duodenum:  Examination of the bulb and second part of the duodenum was  normal.   The endoscope was withdrawn.  The patient tolerated the procedure well.   FINAL DIAGNOSIS:  1. Mild changes of reflux esophagitis, limited to the gastroesophageal     junction along with small sliding hiatal hernia.  2. Nonerosive antral gastritis.  3. It is possible that the patient's atypical chest pain is secondary to     gastroesophageal reflux disease.   RECOMMENDATIONS:  1. Anti-reflux measures reinforced.  Will increase Aciphex to 20 mg p.o.  b.i.d.  2. Helicobacter pylori serology will be checked.  3. Office visit in four weeks.  I have asked the patient to keep her written     record as to the frequency and timing of these episodes.  4. If she remains with these symptoms, she will need esophageal manometry     and a PH study after therapy.      ___________________________________________                                            Hildred Laser, M.D.   NR/MEDQ  D:  02/12/2004  T:  02/13/2004  Job:  [id number]   cc:   Halford Chessman, M.D.  [address] [place]  [place]  [place] [id number]  Fax: [phone]

## 2011-02-20 NOTE — H&P (Signed)
NAMEETHYLENE, REZNICK              ACCOUNT NO.:  [id number]   MEDICAL RECORD NO.:  [id number]          PATIENT TYPE:  INP   LOCATION:  A310                          FACILITY:  [facility]   PHYSICIAN:  Sherrilee Gilles. Gerarda Fraction, MD  DATE OF BIRTH:  06/24/1966   DATE OF ADMISSION:  08/02/2006  DATE OF DISCHARGE:  LH                                HISTORY & PHYSICAL   CHIEF COMPLAINT:  Diarrhea.   HISTORY OF PRESENT ILLNESS:  The patient presented to the emergency room  with explosive multiple diarrhea with severe abdominal cramping.  She  complains of right upper quadrant as well as epigastric pain with occasional  radiation into the chest area.  She also admitted to some vomiting Later in  the course.  No hematemesis, hematochezia or melena.  She denies fevers,  rigors, or chills, although she has felt a little bit chilly.  There is a  recent course of Cipro used to treat urinary tract infection.  Diarrhea has  been worked up as was her abdominal and chest pain in the past with  consultation with GI, and apparently an EGD and possible biliary manometry  and/or ERCP at [facility], although I have no records of that at the present  time.   PAST HISTORY:  As under HPI.   SOCIAL HISTORY:  Works in a hospital setting.  Other than that, no known  exposures to gastroenteritis, viral infections.   FAMILY HISTORY:  Noncontributory.   REVIEW OF SYSTEMS:  As under HPI, otherwise negative.   PHYSICAL EXAMINATION:  GENERAL:  She is awake, alert, a little bit more  comfortable after medications.  HEAD AND NECK:  No JVD or adenopathy.  Neck is supple.  CHEST: Clear.  CARDIAC: Regular rhythm with no murmur, gallop or rub.  ABDOMEN: Minimal subjective tenderness without guarding, rebound,  organomegaly or masses.  EXTREMITIES: Without clubbing, cyanosis, or edema.  NEUROLOGIC: Examination normal.   LABORATORY STUDIES:  C.  Difficile is pending. Hemoccult is pending at  present.  She is mildly hypokalemic at  3.4, but otherwise her electrolytes  and LFTs were normal.  Amylase and lipase have been ordered and are pending.  Initial urinalysis revealed severe concentration with specific gravity  greater than 1.03, 0-2 red cells per high-power field but negative  leukocytes and no white cells reported.   IMPRESSION:  Severe diarrhea and severe abdominal pain with vomiting.  Unknown etiology at present with apparently a detailed workup performed as  well as in progress.  She may be a candidate for colonoscopy.  Will  check her stools for heme. As mentioned, the other infectious studies are  pending right now.  IV hydration, bowel rest, strict avoidance of lactose.  I am not going to be giving any antibiotic therapy pending the C.  Difficile  outcome since she has had previous diarrhea.      Sherrilee Gilles. Gerarda Fraction, MD  Electronically Signed     LJF/MEDQ  D:  08/02/2006  T:  08/02/2006  Job:  [id number]

## 2011-02-20 NOTE — Op Note (Signed)
NAMEYETTA, MARCEAUX              ACCOUNT NO.:  [id number]   MEDICAL RECORD NO.:  [id number]          PATIENT TYPE:  AMB   LOCATION:  DAY                           FACILITY:  [facility]   PHYSICIAN:  Hildred Laser, M.D.    DATE OF BIRTH:  1966-07-20   DATE OF PROCEDURE:  02/17/2005  DATE OF DISCHARGE:  02/17/2005                                 OPERATIVE REPORT   /OPERATION/PROCEDURE/>  Ambulatory PH study, completed on Feb 18, 2005.   INDICATIONS:  [name] is a 45 year old female with atypical chest pain, felt  to be noncardiac and she has not responded to PPI.  She had esophageal  manometry to locate her LES.  Her LES pressure is low at 8.7 mmHg with  nonspecific motility disorder.  She had 10% swallows low amplitude and 90%  were peristaltic.   The procedure was reviewed with the patient.   FINDINGS:  This is channel 2 analysis.   Study duration:  24 hours.   Number of reflux episodes:  42 and 34 were noted in the upright position and  eight in the supine position.   Number of reflux episodes longer than five minutes:  Three.   Longest reflux episode:  19 minutes, occurring in the supine position.   Total time [place] below 4:  58 minutes.   Fraction time PH below 4:  4.1%.   __________ score:  19 (normal less than 14.72).   The patient reported single episode of chest pain between 1355 p.m. and 1443  p.m.  However, no acid was documented during this time.   The patient reported no episode of heartburn.   ASSESSMENT:  Abnormal esophageal acid exposure.  Please note that the  patient has been on proton pump inhibitor although she did not take her dose  yesterday.   The patient reported a single episode of chest pain and acid was not  documented while she had chest pain.   RECOMMENDATIONS:  These results were reviewed with the patient and she was  advised to increase Protonix to 40 mg p.o. b.i.d. for four weeks.  If  symptoms persist, she is will return for followup  visit.      NR/MEDQ  D:  02/22/2005  T:  02/23/2005  Job:  [id number]   cc:   Glori Bickers, M.D. [facility]   Halford Chessman, M.D.  Fax: [phone]

## 2011-02-20 NOTE — Procedures (Signed)
NAMELEVI, CRASS              ACCOUNT NO.:  [id number]   MEDICAL RECORD NO.:  [id number]          PATIENT TYPE:  INP   LOCATION:  A308                          FACILITY:  [facility]   PHYSICIAN:  Cristopher Estimable. Lattie Haw, MD, FACCDATE OF BIRTH:  [birth year]   DATE OF PROCEDURE:  11/16/2006  DATE OF DISCHARGE:                                ECHOCARDIOGRAM   REFERRING PROVIDERS:  Sherrilee Gilles. Gerarda Fraction, MD, and [place].   CLINICAL DATA:  A 45 year old woman with chest pain and dyspnea.   Aorta 3.0, left atrium 3.9, septum 1.4, posterior wall 1.4, LV diastole  4.4, LV systole 3.2.   1. Technically adequate echocardiographic study.  2. Normal left atrium, right atrium and right ventricle.  3. Normal and trileaflet aortic valve; normal proximal ascending      aorta.  4. Normal mitral valve; mild annular calcification.  5. Normal tricuspid and pulmonic valve; normal proximal pulmonary      artery.  6. Normal left ventricular size; mild concentric hypertrophy; normal      regional and global function.  7. Normal IVC.  8. Normal Doppler examination.  9. Comparison with prior study of December 19, 2003:  No segmental wall      motion abnormality now noted.      Cristopher Estimable. Lattie Haw, MD, [facility]  Electronically Signed     RMR/MEDQ  D:  11/18/2006  T:  11/18/2006  Job:  [id number]

## 2011-02-20 NOTE — H&P (Signed)
NAMESHAYNAH, HUND              ACCOUNT NO.:  [id number]   MEDICAL RECORD NO.:  [id number]          PATIENT TYPE:  INP   LOCATION:  A224                          FACILITY:  [facility]   PHYSICIAN:  Sherrilee Gilles. Gerarda Fraction, MD  DATE OF BIRTH:  01/02/1966   DATE OF ADMISSION:  11/08/2006  DATE OF DISCHARGE:  LH                              HISTORY & PHYSICAL   CHIEF COMPLAINT:  Shortness of breath.   HISTORY OF PRESENT ILLNESS:  The patient has developed three days of  progressively increasing shortness of breath associated with fever and  cough.  She also admits to left-sided pleuritic chest pain.  She had no  hemoptysis but did have a pronounced amount of purulent sputum  production.  She also admits to coincidental three days of intermittent  epigastric abdominal cramping associated with mucoid stools.  No  hematemesis, hematochezia or melena.  No poly-myalgia.  The quality of  her abdominal pain is sharp and cramping.  Context is decreased with  bowel movements and loose stool passage and diminishes in severity which  started at a 2:10.  Chest pain has been left-sided and pleuritic.  Its  nature and quality has been sharp.  Location is left anterior lower  chest wall.  She has had a 3/10 to 4/10.  There was associated shortness  of breath.  Timing is with breathing, as above.  She has admitted to the  cough.  Specifications as above.   PAST MEDICAL HISTORY:  1. Uterine fibroids.  2. Ovarian cyst.  3. Long-term hormonal therapy.  4. She has had some gastroesophageal reflux disease, maintained on      Protonix 40 mg p.o. b.i.d. chronically.  5. Previously-documented diverticulosis and a GI consultation for the      same.  6. A previous history of nonspecific mesenteric adenopathy and a      nonspecific terminal ileal wall appearance.  Please see the      previous records and GI consultation for notes on this.   SOCIAL HISTORY:  Negative for smoking.  Works as a Statistician  in our hospital.  No alcohol use or other substance use.  She is a  gravida 4, para 4.   FAMILY HISTORY:  Positive for lupus as well as mesothelioma,  hypertension, congestive heart failure and diabetes.  She has four  children with questionable reactive airway disease/asthma.   CHEST X-RAY:  She is in the office.  Received Depo-Medrol and a Xopenex  treatment without much change.  We had a chest x-ray outpatient which  shows by my interpretation, blunted left costophrenic angle and  pronounced bronchitic markings in the right lower lobe, possibly a  little infiltrate.  No pneumothorax was seen.  There is no mediastinal  adenopathy evident by plain chest x-ray.   Other labs are pending and will be reviewed when available.   REVIEW OF SYSTEMS:  Neuropsychiatrically she appears distressed and  anxious, secondary to her breathing, but nothing acute otherwise.   ALLERGIES:  As outlined above are negative.   PHYSICAL EXAMINATION:  SKIN:  Unremarkable.  HEENT/NECK:  No  jugular venous distention or adenopathy.  CHEST:  Diffuse inspiratory and expiratory wheezing and scattered  rhonchi in all fields.  No rales appreciated.  No friction rubs.  ABDOMEN:  Soft, no organomegaly or masses.  No guarding or rebound  tenderness.  EXTREMITIES:  Without clubbing, cyanosis or edema.  NEUROLOGIC:  Nonfocal.   IMPRESSION/PLAN:  1. Reactive airway disease, possible pneumonic infiltrate in the right      lower lobe:  Admit for broad-spectrum antibiotic for community-      acquired pneumonia,  bronchodilators, IV steroids.  Will check a D-      dimer to make sure that this pleuritic chest pain is not worrisome      for a pulmonary embolus.  2. Long-term medication use:  No concerns at present.  3. Previous abdominal pain, question diverticulosis or mesenteric      adenopathy:  If the abdominal pain continues, will consult      gastroenterology for followup in-house.      Sherrilee Gilles. Gerarda Fraction, MD   Electronically Signed     LJF/MEDQ  D:  11/08/2006  T:  11/08/2006  Job:  [id number]

## 2011-02-20 NOTE — Discharge Summary (Signed)
NAME:  [name], [name]                        ACCOUNT NO.:  [id number]   MEDICAL RECORD NO.:  [id number]                   PATIENT TYPE:  INP   LOCATION:  2119                                 FACILITY:  [facility]   PHYSICIAN:  Ernestine Mcmurray, M.D. [facility]             DATE OF BIRTH:  [birth year]   DATE OF ADMISSION:  01/01/2004  DATE OF DISCHARGE:  01/01/2004                           DISCHARGE SUMMARY - REFERRING   SUMMARY OF HISTORY:  Ms. Nabi is a 45 year old African-American female  without prior cardiac history.  She describes a several-week history of  ongoing substernal chest discomfort which occurs at rest and with exertion.  She had a CT scan on the evening prior to admission to rule out pulmonary  embolus as she is on hormonal therapy.  This was  within normal limits.  An  echocardiogram showed mild inferior hypokinesis and EF of 45 to 50%, thus  prompted her referral to Dr. Wilhemina Cash on April 1.  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  She also described some left arm  numbness.  The episodes can occur at rest and last 5 to 10 minutes,  worsening component with exertion, relieved with rest.  She has had  associated diaphoresis and some nausea.  She has also been worked up for  gallbladder problems and a HIDA scan done approximately a year ago.  Markers  in the ER were negative for myocardial infarction.   LABORATORY AND X-RAY DATA:  EKG shows normal sinus rhythm, normal axis,  early R waves, nonspecific ST-T wave changes.   Hemoglobin 12.8, hematocrit 37.9, normal indices, platelets 332, WBC 5.8.  PTT 33, PT 13.5.  Sodium 137, potassium 3.8, BUN 12, creatinine 1.0, glucose  118.  LFTs were  within normal limits.. Alkaline phosphatase was slightly  low at 27.  Pregnancy was negative by urine sample.  Amylase 100,  lipase 25.  CK-MB and troponin negative for myocardial infarction.   Chest x-ray did not show any active disease.   HOSPITAL COURSE:  Ms. Hasley was admitted to [facility].  It was felt  that her discomfort had typical and atypical findings.  Dr. Dannielle Burn felt she  should undergo cardiac catheterization to rule out definitive coronary  artery disease.  This was arranged for March 29.  Verbal report stated that  the patient did not have any coronary artery disease and normal LV function.  It is noted at this time there is not an actual report on the chart.  With  bedrest, she was ambulating in the hall without difficulty, and  catheterization site was intact.  It was felt should be discharged home.   DISCHARGE DIAGNOSIS:  Noncardiac chest discomfort.   DISPOSITION:  She was discharged home and asked to continue her home  medications.  She was advised no lifting or tub soaking for one week.  Avoid  driving, sexual activity, or heavy exertion for two days.  Return to work on  Thursday.  She may remove the catheterization site dressing on Wednesday and  shower.  Maintain low-salt, low-fat,  low-cholesterol diet.  If she has any problems with the catheterization  site, she was given our office number to call.  She was asked to arrange a  followup appointment with Dr. Armandina Gemma to further evaluate the etiology of her  chest discomfort.      Sharyl Nimrod, P.A. [facility]                    Ernestine Mcmurray, M.D. [facility]    EW/MEDQ  D:  01/01/2004  T:  01/01/2004  Job:  [id number]   cc:   Dr. Colin Broach, [place]

## 2011-02-20 NOTE — Discharge Summary (Signed)
NAMEDESEREA, BORDLEY              ACCOUNT NO.:  [id number]   MEDICAL RECORD NO.:  [id number]          PATIENT TYPE:  INP   LOCATION:  A308                          FACILITY:  [facility]   PHYSICIAN:  Sherrilee Gilles. Gerarda Fraction, MD  DATE OF BIRTH:  12-01-1965   DATE OF ADMISSION:  11/08/2006  DATE OF DISCHARGE:  02/15/2008LH                               DISCHARGE SUMMARY   DISCHARGE DIAGNOSES:  1. Acute exacerbation of asthma.  2. Possible pneumonia versus atelectasis left lower lobe.  3. Antibiotic induced thrush stomatitis.   DISCHARGE MEDICATIONS:  1. Fluconazole 100 mg daily x5 days.  2. Xopenex 1.25 mg nebulizer x4 daily.  3. Levaquin 500 mg p.o. daily.  4. Medrol Dose Pak.  5. Protonix 40 mg p.o. b.i.d.  6. Theophylline, i.e. Theo-dur 300 mg p.o. b.i.d.  7. Spiriva one inhalation daily.   SUMMARY:  The patient was admitted with intractable wheezing, cough and  dyspnea evidenced by bronchospasm. Her chest x-ray showed pleural  effusions as well as atelectasis versus infiltrate left lower lobe. She  had great difficulty in clearing her bronchospasm with a constantly  irritated airway requiring maximal bronchodilator therapy as well as  pulmonary consultation. The patient responded gradually and slowly but  surely over the course of her admission. On the day of discharge she was  on nebulizers and oral medication with an improved chest x-ray dated  November 18, 2006.   She will follow-up in the office in about 5 days.      Sherrilee Gilles. Gerarda Fraction, MD  Electronically Signed     LJF/MEDQ  D:  11/19/2006  T:  11/19/2006  Job:  [id number]

## 2011-02-20 NOTE — Group Therapy Note (Signed)
NAMEJARELYN, BAMBACH              ACCOUNT NO.:  [id number]   MEDICAL RECORD NO.:  [id number]          PATIENT TYPE:  INP   LOCATION:  A308                          FACILITY:  [facility]   PHYSICIAN:  Edward L. Luan Pulling, M.D.DATE OF BIRTH:  1965/12/17   DATE OF PROCEDURE:  11/17/2006  DATE OF DISCHARGE:                                 PROGRESS NOTE   PRIMARY CARE PHYSICIAN:  Sherrilee Gilles. Gerarda Fraction, M.D.   Ms. Goh is still coughing, still congested but seems to be getting a  little better.  We discussed again the fact that she has a history of  eczema, two of her children have eczema/asthma but she has got no  history of anything like that.   PHYSICAL EXAMINATION:  VITAL SIGNS:  Temperature is 97.5, pulse 78,  respirations 20, blood pressure 130/91, O2 saturations 98%.  CHEST:  Clearer than it has been.   ASSESSMENT:  I think she is better.   PLAN:  She is hopeful of being able to be discharged soon.  We are  awaiting an echocardiogram to help with discharge planning.      Edward L. Luan Pulling, M.D.  Electronically Signed     ELH/MEDQ  D:  11/17/2006  T:  11/17/2006  Job:  [id number]   cc:   Sherrilee Gilles. Gerarda Fraction, MD  Fax: [phone]

## 2011-02-20 NOTE — Op Note (Signed)
[name], [name]              ACCOUNT NO.:  [id number]   MEDICAL RECORD NO.:  [id number]          PATIENT TYPE:  AMB   LOCATION:  DAY                           FACILITY:  [facility]   PHYSICIAN:  Hildred Laser, M.D.    DATE OF BIRTH:  11-01-1965   DATE OF PROCEDURE:  08/24/2006  DATE OF DISCHARGE:                                 OPERATIVE REPORT   PROCEDURE:  Esophagogastroduodenoscopy.   INDICATIONS:  [name] is a 45 year old [place] female with recurrent  right sided abdominal pain who has undergone extensive evaluation which has  been negative.  She is also refer to [facility] for consideration of ERCP for  manometry but they felt that she had IBS rather than sphincter of Oti  dysfunction.  She recently had a colonoscopy by Dr. Gala Romney.  She also had  given capsule study.  She had multiple erosions and a small ulcer in the  stomach.  She is therefore undergoing diagnostic EGD.  Procedure risks were  reviewed the patient, informed consent was obtained.   MEDS FOR CONSCIOUS SEDATION:  Benzocaine spray for pharyngeal topical  anesthesia. Demerol 75 mg IV, Versed 15 mg IV.   FINDINGS:  Procedure performed in endoscopy suite.  The patient's vital  signs and O2 sat were monitored during the procedure and remained stable.  The patient was placed left lateral position and Olympus videoscope was  passed oropharynx without any difficulty into the esophagus.   Esophagus.  Mucosa of the esophagus was normal.  GE junction was at 40 cm  from the incisors and was unremarkable.   Stomach.  It was empty and distended very well with insufflation.  Folds of  proximal stomach were normal.  Examination mucosa revealed single erosion at  antrum on raised mucosa.  Angularis, fundus and cardia were examined by  retroflexing the scope and were normal.  Pyloric channel was patent.   Duodenum.  There was a single area on the right side with erythema, edema  and erosion.  Scope was passed into the second  and third part of duodenum  where mucosa and folds were normal.   Biopsy was taken from this abnormal patch of bulbar mucosa and also antral  mucosa with erosion was biopsied for histology.  Endoscope was withdrawn.  The patient tolerated the procedure well.   FINAL DIAGNOSIS:  Gastroduodenitis with this single erosion at antrum on  raised erythematous mucosa which was biopsied for histology.  Focal bulbar  duodenitis.  The area biopsied again primarily looking for __________ for  Crohn disease.   RECOMMENDATIONS:  The patient will resume her usual meds.  I will be  contacting with results of biopsy and further recommendations.   If biopsy is negative or nondiagnostic would consider surgical consultation  and see if she should just have her gallbladder removed.  The symptoms are  suggestive of GB disease, although study has been negative.      Hildred Laser, M.D.  Electronically Signed     NR/MEDQ  D:  08/24/2006  T:  08/24/2006  Job:  [id number]   cc:   Dr. Hilma Favors

## 2011-02-20 NOTE — Consult Note (Signed)
NAMEGEANNA, [name]                          ACCOUNT NO.:  [id number]   MEDICAL RECORD NO.:  [id number]                  [name] TYPE:   LOCATION:                                       FACILITY:   PHYSICIAN:  Hildred Laser, M.D.                 DATE OF BIRTH:  Sep 23, 1966   DATE OF CONSULTATION:  02/06/2004  DATE OF DISCHARGE:                                   CONSULTATION   GASTROINTESTINAL CONSULTATION   REQUESTING PHYSICIAN:  Halford Chessman, M.D.   REASON FOR CONSULTATION:  Atypical chest pain.   HISTORY OF PRESENT ILLNESS:  [name] is a 45 year old obese, Caucasian  female who presented to our office for evaluation of atypical chest pain.  Around the end of March [name] was admitted to [facility] with  episode of, what she describes, as an elephant sitting on her chest.  Full  cardiac workup including echocardiogram and cardiac catheterization appeared  negative. She reports that she does have an element of mitral and tricuspid  mild regurgitation.  She has noted some nausea; however, she has not  vomited. She denies any increased belching, heartburn, indigestion, or  regurgitation.  She does note episodes of loose, watery stools 3 or more  times a day.  She does report occasional mucous in her stools, however, she  denies any rectal bleeding or melena.  She describes intermittent crampy  lower quadrant abdominal pain as well.  Chest pain typically occurs on a  daily basis. She notes that the pain is retrosternal and epigastric in  nature.  She occasionally has shortness of breath with the pain.  She denies  any dysphagia or odynophagia.  Pain is not necessarily associated with  foods.   She also had reportedly CT scan of her chest to rule out pulmonary embolus  which was negative as well.  She has been given Aciphex to take; however,  she has only taken 1 or 2 pills intermittently.  She does report a 50 pound  weight gain in the last 6 months.   HIDA  scan was performed January 14, 2004 which was normal with an EF of 60%  and no evidence of cystic duct or CBD obstruction.   PAST MEDICAL HISTORY:  1. Frequent urinary tract infections.  2. Hormone replacement therapy.  3. Mild mitral valve regurgitation.  4. Questionable EGD many years ago; however, I do not have details on [name]     as the [name] cannot remember a date or who performed [name].  She states     that it was approximately 10 years ago for GERD-type symptoms.  5. She is scheduled for total hysterectomy due to uterine fibroids by Dr.     Bynum Bellows.  6. Cesarean section x1.  7. Exploratory laparotomy.   CURRENT MEDICATIONS:  1. Aygestin 5 mg 1/2 tablet daily.  2. Aciphex 20 mg daily.  3. Motrin 200  mg p.r.n.  4. Tylenol 500 mg p.r.n.   ALLERGIES:  CT DYE.   FAMILY HISTORY:  No known family history of colorectal carcinoma, liver, or  other chronic GI problems.  Mother and father are both healthy.  She has 1  healthy sister and 2 healthy brothers.   SOCIAL HISTORY:  [name] has been married for 13 years.  She has 4  healthy children.  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 She denies any tobacco, alcohol, or drug use.   REVIEW OF SYSTEMS:  CONSTITUTIONAL:  Denies any fever or chills.  She is  complaining of some fatigue for the last approximately 6 months, as well as  a 50 pound weight gain.  Appetite is okay.  GI:  See HPI.  ENDOCRINE:  She  denies any history of diabetes mellitus or hypothyroidism.  HEME:  She  denies any history of anemia, blood dyscrasias or easy bruising.  CARDIOVASCULAR:  See HPI.  PULMONARY:  She does report occasional shortness  of breath with chest pain.  She also reports occasional dyspnea on exertion;  however, she notes that [name] has gotten worse with her persistent weight  gain.  She denies any cough or hemoptysis.   PHYSICAL EXAMINATION:  VITAL SIGNS:  Weight 238.5 pounds.  Height 66-1/2  inches.  Temperature 98.1, blood pressure 120/80, pulse 72.  GENERAL:  [name] is a 45 year old, obese, African-American female in  no acute distress.  She is alert, oriented, pleasant and cooperative.  HEENT:  Sclerae are clear.  Nonicteric. Conjunctivae pink. Oropharynx pink  and moist without any lesions.  NECK:  Supple without any masses or thyromegaly.  HEART:  Regular rate and rhythm.  Normal S1-S2 without any murmurs, clicks,  rubs, or gallops.  LUNGS:  Clear to auscultation bilaterally.  ABDOMEN:  Obese with positive bowel sounds x4.  No bruits auscultated.  Soft, nontender, nondistended.  No palpable mass or hepatosplenomegaly.  No  rebound tenderness or guarding.  Negative Murphy's sign.  RECTAL:  Revealed no external hemorrhoids visualized.  Good sphincter tone.  Small amount of light brown, Hemoccult-negative stool was obtained from the  vault.  No masses palpated.  EXTREMITIES:  Good pedal pulses bilaterally.  No edema.  SKIN:  Brown, warm and dry, without any rash or jaundice.   ASSESSMENT:  [name] is a 45 year old African-American female with  about a 6-week history of atypical retrosternal chest pain.  She reports  episodes of [name] pain almost on a daily basis.  Cardiac workup including  echocardiogram, cardiac catheterization and chest CT have not revealed a  source of her pain.  She has been given proton pump inhibitor; however, I do  not feel that she has been given adequate trial of [name].  HIDA scan was  fine; and I do not necessarily feel that her pain is related to  cholecystitis or cholelithiasis.  She may have an element of  gastroesophageal reflux disease although her symptoms are definitely  atypical.  Would recommend further evaluation given the fact that her pain  is significant, acute, and pretty consistent on a daily basis.  She may have developed erosive esophagitis.  As far as her symptoms of diarrhea or  actually loose stools her history sounds very  consistent with irritable  bowel syndrome.   RECOMMENDATIONS:  1. Will schedule EGD with Dr. Laural Golden in the near future.  I have discussed     [name] procedure including risks  and benefits which include, but are not     limited to, bleeding, perforation, and drug reaction.  She agrees with     [name] plan. Consent will be obtained.  2. Recommended steady weight loss.  3. Recommended that she reinitiate Aciphex 20 mg to be taken on a daily     basis. I have asked her to give [name], at a     minimum of 2-to-3-week trial taking 30 minutes before breakfast to see if     [name] helps with her symptoms at all.  4. Further recommendations pending procedure.   We would like to thank Dr. Hilma Favors for allowing [place] to participate in the  care of [name].     ________________________________________  ___________________________________________  Les Pou, N.P.                  Hildred Laser, M.D.   KC/MEDQ  D:  02/07/2004  T:  02/07/2004  Job:  [id number]   cc:   Hildred Laser, M.D.  [address]  [place]  [place] [address]  Fax: [phone]   Halford Chessman, M.D.  [address] [place]  [place]   [address]  Fax: [phone]

## 2011-02-20 NOTE — Op Note (Signed)
[name] [name]              ACCOUNT NO.:  [id number]   MEDICAL RECORD NO.:  [id number]          PATIENT TYPE:  AMB   LOCATION:  DAY                           FACILITY:  [facility]   PHYSICIAN:  R. Garfield Cornea, M.D. DATE OF BIRTH:  [birth year]   DATE OF PROCEDURE:  08/17/2006  DATE OF DISCHARGE:                                 OPERATIVE REPORT   PROCEDURE PERFORMED:  Small bowel Givens capsule endoscopy.   ENDOSCOPIST:  [name], M.D.   INDICATIONS:  The patient is a 45 year old lady with history of chronic  diarrhea, chronic right-sided abdominal pain and a recent 20-pound weight  loss.  She has had multiple studies without any clear etiology of her  symptoms.  It is felt that she may have functional bowel disease or IBS.  She recently required hospitalization.  She underwent a colonoscopy, which  revealed normal rectum, diverticula, normal terminal ileum to 20 cm,  otherwise it was a normal study.  She had a CT of the abdomen and pelvis,  which revealed numerous normal size lymph nodes throughout the small body  mesentery, a tiny hiatal hernia, minimal prominence of the terminal ileum  wall, which is nonspecific, and a very possible small right inguinal hernia.   Previous tests have included a normal ejection fraction of 65% and normal-  appearing gallbladder on ultrasound.  She has had a __________  at [facility] in March 2007, which revealed  possible mild chronic pancreatitis in the head of the pancreas with a  subsequent MRCP, which was normal.  Small bowel Givens endoscopy is done to  further evaluate her small bowel with a history of chronic right-sided  abdominal pain and chronic diarrhea.   DESCRIPTION OF FINDINGS AND PROCEDURE:  The first gastric image was at 1  minute 53 seconds.  Gastric passage time was 1 hour 44 minutes and small  bowel passage time was 1 hour 55 minutes with the first cecal image at 3  hours 41 minutes  43 seconds.  She had several areas of erosion type lesions;  some with depth noted at the pylorus possibly some bowel ulceration noted.  There were some erosions apparent in the region of the duodenal bulb as  well; these are multiple in number.  At 2 hours 45 minutes she had a small  possible erosion in the small bowel.  She had areas of significant erythema  without obvious ulcerations or erosions at 2 hours 50 minutes, 2 hours 51  minutes, 2 hours 52 minutes, 2 hours, 59 minutes, 3 hours 6 minutes, 3 hours  11 minutes, 3 hours 12 minutes, 3 hours 15 minutes, 3 hours 16 minutes, and  3 hours 19 minutes.  We cannot describe these as frank ulcerations, however.   SUMMARY AND RECOMMENDATIONS:  This study was reviewed by Dr. Gala Romney.  There  were multiple areas of gastric erosions at the pyloric channel and appeared  to be in the duodenal bulb. Some of these appear to have some depth,  possibly early ulcerations.  Within the small bowel itself there were  several areas of erythematous and erosive type lesions, but no frank  ulcerations noted both in the proximal and distal small bowel as outlined  above.  It has  been a couple years since the patient's last upper endoscopy and given these  current findings Dr. Gala Romney has recommended that she have a repeat EGD.  We  will arrange for this to be done in the very near future.   The patient is aware of the results i.e. the patient has been notified of  these results.      Neil Crouch, P.ABridgette Habermann, M.D.  Electronically Signed    LL/MEDQ  D:  08/17/2006  T:  08/18/2006  Job:  [id number]

## 2011-02-20 NOTE — Consult Note (Signed)
[name], [name]              ACCOUNT NO.:  [id number]   MEDICAL RECORD NO.:  [id number]          PATIENT TYPE:  INP   LOCATION:  A308                          FACILITY:  [facility]   PHYSICIAN:  Edward L. Luan Pulling, M.D.DATE OF BIRTH:  June 28, 1966   DATE OF CONSULTATION:  DATE OF DISCHARGE:                                 CONSULTATION   REASON FOR CONSULTATION:  Shortness of breath.   HISTORY:  [name] is a 45 year old African-American female who was  admitted on the 4th of this month with cough, congestion, shortness of  breath and fever.  She had some left-sided pleuritic pain.  She had  hemoptysis but also had purulent sputum.  She had been hospitalized  since then on steroid and antibiotics, etc., and although she is better,  she continues to have significant problems with shortness of breath and  pain.  She has no previous history of any sort of lung disease to her  knowledge.  Her temperature was as high as 103.   PAST MEDICAL HISTORY:  1. Ovarian cyst.  2. History of fibroids of the uterus.  3. She has had gastroesophageal reflux disease.  4. She has had a cholecystectomy.   She has been on Protonix 40 mg b.i.d. at home and she has been on  multiple medications in the hospital, including IV antibiotics and IV  steroids.   SOCIAL HISTORY:  She works as a Statistician.  She does not use  alcohol.  She does not use tobacco.   FAMILY HISTORY:  Positive for SLE, positive for a mesothelioma,  hypertension, congestive heart failure and diabetes, and her children  have had a history of what may be asthma versus reactive airway disease.   REVIEW OF SYSTEMS:  Otherwise negative.   PHYSICAL EXAMINATION:  GENERAL:  She looks pretty comfortable but still  coughing and still wheezing.  VITAL SIGNS:  Her blood pressure is 140/80, pulse is 80, respirations  are about 18.  HEENT:  Her mucous membranes are moist.  Her nose and throat are clear.  Her tympanic membranes are  intact.  Mucous membranes okay.  NECK:  Supple without masses.  CHEST:  Rhonchi bilaterally, more anteriorly and over the trachea.  CARDIAC:  Her heart is regular.  ABDOMEN:  Soft.  EXTREMITIES:  She does not have any edema.  CENTRAL NERVOUS SYSTEM:  Grossly intact.   Chest x-ray showed blunted left costophrenic angle, bronchitic markings  in the right lower lobe.   ASSESSMENT:  She has shortness of breath and has had pleuritic chest  pain.  It is not clear why she is having so much trouble but she pretty  clearly does have some form of reactive airway disease.  I have  discussed this with her.  She says that she has had a CT chest but I do  not see the report, I think that it was done somewhere else and ask Dr.  Gerarda Fraction about that.  She has had a reduction in the dose of her steroids  and is hopeful to be discharged fairly soon, which I think is okay.  At  this point she is going to need to continue with her medications and  have her followed.  I did not change any of her medications today.  She  did ask for some Chloraseptic, and I provided that.  No other new  treatments at this point.  Will discuss with Dr. Gerarda Fraction and then go from  there.   Thanks for allowing me to see her with you.      Edward L. Luan Pulling, M.D.  Electronically Signed     ELH/MEDQ  D:  11/15/2006  T:  11/16/2006  Job:  [id number]

## 2011-02-20 NOTE — Cardiovascular Report (Signed)
NAME:  [name], [name]                        ACCOUNT NO.:  [id number]   MEDICAL RECORD NO.:  [id number]                   PATIENT TYPE:  INP   LOCATION:  2800                                 FACILITY:  [facility]   PHYSICIAN:  Ethelle Lyon, M.D. [facility]         DATE OF BIRTH:  1966-02-27   DATE OF PROCEDURE:  01/01/2004  DATE OF DISCHARGE:  01/01/2004                              CARDIAC CATHETERIZATION   PROCEDURE:  Left heart catheterization, left ventriculography, coronary  angiography, root aortography, Angio-Seal closure of RFA.   INDICATION:  [name] is a 45 year old lady who presents with several  weeks of chest pain occurring both with exertion and at rest.  Prior  evaluation has CT which has been negative for evidence of pulmonary emboli.  She suffered worsened pain last evening prompting admission to the hospital.  She was ruled out for myocardial infarction by serial enzymes and  electrocardiograms.  She was referred by Dr. Dannielle Burn for cardiac  catheterization.   PROCEDURAL TECHNIQUE:  Informed consent was obtained.  Under 1% lidocaine  local anesthesia, a 5 French sheath was placed in the right femoral artery  using the modified Seldinger technique.  Diagnostic angiography and  ventriculography were performed using JL-4, JR-4, and pigtail catheters.  The pigtail catheter was then pulled back to the aortic root and aortography  performed by power injection.  Finally, after sheath injection demonstrated  the sheath to enter the common femoral artery below the inguinal ligament,  the arteriotomy was closed using a 6 [place] Angio-Seal device.  Complete  hemostasis was obtained.  The patient tolerated the procedure well and was  transferred to the holding room in stable condition.   COMPLICATIONS:  None.   FINDINGS:  1. LV:  124/10/19. EF 65% without regional wall motion abnormality.  2. No aortic stenosis or mitral regurgitation.  3. No evidence of aortic dissection.  4. Left main:  Angiographically normal.  5. LAD:  Large vessel giving rise to two diagonal branches and wrapping the     apex of the heart.  It is angiographically normal.  6. Circumflex:  Moderate size vessel giving rise to two obtuse marginals.     It is angiographically normal.  7. RCA:  Moderate size, dominant vessel.  It is angiographically normal.   IMPRESSION/RECOMMENDATIONS:  1. Angiographically normal coronary arteries.  2. No aortic stenosis.  3. No evidence of aortic dissection.  4. Normal left ventricular size and systolic function.   I suspect a noncardiac etiology to her chest discomfort.  The patient is  instructed to follow up with Dr. Hilma Favors for further evaluation.  She will  be discharged from hospital this evening.                                               Gwyndolyn Saxon  Cordella Register, M.D. [facility]    WED/MEDQ  D:  01/01/2004  T:  01/02/2004  Job:  [id number]   cc:   Halford Chessman, M.D.  [address] [place]  [place] [address]  Fax: [phone]   Scarlett Presto, M.D.  Fax: [phone]

## 2011-02-20 NOTE — Group Therapy Note (Signed)
NAMESELENNE, [name]              ACCOUNT NO.:  [id number]   MEDICAL RECORD NO.:  [id number]          PATIENT TYPE:  INP   LOCATION:  A308                          FACILITY:  [facility]   PHYSICIAN:  Edward L. Luan Pulling, M.D.DATE OF BIRTH:  01-07-1966   DATE OF PROCEDURE:  DATE OF DISCHARGE:                                 PROGRESS NOTE   PROBLEMS:  Cough, congestion.   SUBJECTIVE:  [name] is apparently doing a little better.  She still  has cough, still has congestion, but not it is not clear the cause as  yet.  She had chest pain and a cardiac catheterization done in 2005 that  showed no aortic stenosis, no mitral regurgitation.  Her coronary  arteries were normal, and there was some question of a valvular heart  problem, but I do not see that listed on her cardiac catheterization.   PLAN:  At this point, I think we should go ahead and get an  echocardiogram.  I believe she probably has asthma, based on family  history, etc., but I think an echocardiogram would be helpful and I will  plan to go ahead and order that.      Edward L. Luan Pulling, M.D.  Electronically Signed     ELH/MEDQ  D:  11/16/2006  T:  11/16/2006  Job:  [id number]

## 2011-02-20 NOTE — H&P (Signed)
[name], [name]              ACCOUNT NO.:  [id number]   MEDICAL RECORD NO.:  [id number]          PATIENT TYPE:  AMB   LOCATION:  DAY                           FACILITY:  [facility]   PHYSICIAN:  Jamesetta So, M.D.  DATE OF BIRTH:  05-Jul-1966   DATE OF ADMISSION:  DATE OF DISCHARGE:  LH                              HISTORY & PHYSICAL   CHIEF COMPLAINT:  Biliary colic secondary to chronic cholecystitis.   HISTORY OF PRESENT ILLNESS:  The patient is a 45 year old black female  who is referred for evaluation and treatment of biliary colic secondary  to chronic cholecystitis.  She has been having right upper quadrant  abdominal pain, nausea, bloating for many months.  She does have fatty  food intolerance.  No fever, chills, jaundice have been noted.  The  patient has had an extensive workup by gastroenterology, which has been  relatively negative, except for a hiatal hernia.  There is a question of  whether she has microlithiasis, causing of biliary colic.   PAST MEDICAL HISTORY:  As noted above.   PAST SURGICAL HISTORY:  Hysterectomy, shoulder surgery x2, cardiac  catheterization.   CURRENT MEDICATIONS:  1. Bentyl.  2. Protonix.  3. Ondansetron.  4. Hydrocodone.  5. Fibercon.   ALLERGIES:  IV DYE.   REVIEW OF SYSTEMS:  Noncontributory.   PHYSICAL EXAMINATION:  GENERAL:  The patient is a well-developed, well-  nourished black female in no acute distress.  HEENT:  Examination reveals no scleral icterus.  LUNGS:  Clear to auscultation with equal breath sounds bilaterally.  HEART:  Examination reveals regular rate and rhythm without S3, S4,  murmurs.  ABDOMEN:  Soft and nondistended.  She is tender in the right upper  quadrant to palpation.  No hepatosplenomegaly, masses, hernias are  identified.   IMPRESSION:  Biliary colic, probable chronic cholecystitis.   PLAN:  The patient is scheduled for laparoscopic cholecystectomy on  September 08, 2006.  The risks and benefits of  the procedure including  bleeding, infection, hepatobiliary, recurrence of the symptoms, and the  possibility of an open procedure were fully explained to the patient,  gave informed consent.      Jamesetta So, M.D.  Electronically Signed     MAJ/MEDQ  D:  09/02/2006  T:  09/03/2006  Job:  [id number]   cc:   Jamesetta So, M.D.  Fax: [phone]   Hildred Laser, M.D.  [address]  [place]  [place] [address]   Edward L. Luan Pulling, M.D.  Fax: [phone]

## 2011-02-20 NOTE — H&P (Signed)
NAME:  [name], [name]                        ACCOUNT NO.:  [id number]   MEDICAL RECORD NO.:  [id number]                   PATIENT TYPE:  INP   LOCATION:  3818                                 FACILITY:  [facility]   PHYSICIAN:  Ernestine Mcmurray, M.D. [facility]             DATE OF BIRTH:  Mar 27, 1966   DATE OF ADMISSION:  01/01/2004  DATE OF DISCHARGE:                                HISTORY & PHYSICAL   REFERRING PHYSICIAN:  Dr. Hilma Favors of [facility] in [place].   REASON FOR ADMISSION:  Substernal chest pain.   HISTORY OF PRESENT ILLNESS:  [name] is a 45 year old African-American  female with no prior cardiac history. The patient, however, has been  bothered by several weeks now of ongoing substernal chest pain which both  occurs at rest and exertion. Last night, she had an evaluation with a CT  scan to rule out pulmonary emboli as she was on hormonal therapy. This  reportedly was within normal limits. She also underwent an echocardiogram  which showed mild inferior hypokinesis and an abnormal ejection fraction of  45 to 50%. This prompted referral to Dr. Wilhemina Cash for which he was scheduled  this Friday.   The patient is a respiratory therapist at [employer], and tonight  she developed sudden onset of substernal chest pain which worsened on  exertion. She stated her pain is midsternal but is radiating from the  epigastrium on upward. She also felt some numbness in the left arm and  numbness in the left leg. She reports that over the last several weeks this  has been an ongoing problem, now with increasing frequency. Episodes can  occur at rest and usually last 5 to 10 minutes. There is worsening component  with exertion at which time the patient has to sit down to make pain go  away. She says it has been associated with diaphoresis and some nausea. On  admission to the emergency room, her electrocardiogram shows no acute  changes. Of note is that she has also been worked up for  gallbladder  problems and had a HIDA scan done a year ago. Initial cardiac enzyme markers  in the emergency room are negative.   ALLERGIES:  No known drug allergies.   SOCIAL HISTORY:  The patient is a respiratory therapist at [employer]. She is married. She has four children. She does not smoke.   FAMILY HISTORY:  Noncontributory. There is no family history of coronary  artery disease.   PAST MEDICAL HISTORY:  As outlined above.  1. This also includes history of fibroids, and the patient is currently     amenorrheic on hormone replacement therapy.  2. Nausea and heaving after intravenous dye with CAT scan last week.   REVIEW OF SYSTEMS:  As per HPI. No nausea or vomiting. No fever or chills.  No abdominal pain. No palpitations or syncope. No claudication. The  remainder of review  of systems is noncontributory.   PHYSICAL EXAMINATION:  VITAL SIGNS:  Blood pressure 116/82, heart rate is 75  beats per minute.  GENERAL:  Overweight African-American female with no distress.  HEENT:  Pupils isocoric. Conjunctivae clear.  NECK:  Supple. Normal carotid upstroke. No carotid bruit. Positive  thyromegaly but no bruit and no thrill.  LUNGS:  Clear breath sounds bilaterally. There is no wheezing.  HEART:  Regular rate and rhythm, normal S1 and S2. No murmurs, rubs, or  gallops.  ABDOMEN:  Nontender. No rebound or guarding. Good bowel sounds.  EXTREMITIES:  2+ peripheral pulses. There is no clubbing, cyanosis, or  edema.   LABORATORY DATA:  Pending.   Initial set of cardiac markers is negative. Twelve-lead electrocardiogram  demonstrates normal sinus rhythm, otherwise normal tracing.   Chest x-ray is pending.   IMPRESSION AND PLAN:  1. Substernal chest pain. The patient both has typical and atypical features     to her chest pain. She really has no cardiac risk factors; however, of     concern is her abnormal echocardiogram with possible inferior hypokinesis     and slightly  decreased ejection fraction. Given the fact the patient's     pain has now been going on for several weeks and in light of this     abnormal echocardiogram, I feel that the best option for her is to     proceed with cardiac catheterization. I have explained the risks and     benefits of this procedure to the patient, and she is willing to proceed.     Of note is that the patient did have nausea and heaving after she was     administered IVP dye with a CAT scan a week ago. She does not have any     true anaphylaxis but will go ahead and pretreat her with prednisone,     Zantac, and Benadryl. Her first dose of these medications were given at     6:00 this morning and will be repeated at noon, at which point we can     likely proceed with cardiac catheterization later this afternoon.  2. Rule out pulmonary embolus. The patient reportedly had a CAT scan done,     and this will be reviewed. We will repeat D-dimer.  3. Rule out gastrointestinal or other gallbladder problems. Amylase and     lipase have been checked. If cardiac catheterization is negative, the     patient may require formal GI evaluation.   DISPOSITION:  Will proceed with cardiac catheterization later today.                                                Ernestine Mcmurray, M.D. [facility]    GED/MEDQ  D:  01/01/2004  T:  01/01/2004  Job:  [id number]   cc:   Orlie Dakin, M.D.  [address]  [place] [address]  Fax: [phone]   Scarlett Presto, M.D.  Fax: [phone]

## 2011-02-20 NOTE — Op Note (Signed)
[name], [name]              ACCOUNT NO.:  [id number]   MEDICAL RECORD NO.:  [id number]          PATIENT TYPE:  INP   LOCATION:  A310                          FACILITY:  [facility]   PHYSICIAN:  R. Garfield Cornea, M.D. DATE OF BIRTH:  1966/07/13   DATE OF PROCEDURE:  08/05/2006  DATE OF DISCHARGE:                                 OPERATIVE REPORT   PROCEDURE:  Screening colonoscopy with biopsy.   INDICATIONS:  The patient is a 45 year old lady with __________ chronic  diarrhea, chronic right-sided abdominal pain, 20 pound weight loss recently.  Colonoscopy is now being done.  This approach has been discussed with the  patient at length.  The potential risks, benefits and alternatives have been  reviewed, questions answered.   DESCRIPTION OF PROCEDURE:  Oxygen saturation, blood pressure, pulse and  respiration were monitored throughout the entire procedure.  Conscious  sedation with Versed and Demerol in incremental doses.  The instrument was  the Olympus video chip system.   FINDINGS:  Digital rectal exam revealed no abnormalities.   ENDOSCOPIC FINDINGS:  Prep was suboptimal.  Rectum:  Examination of the rectal mucosa including retroflexion in the anal  verge revealed no abnormalities.  Colon:  Colonic mucosa was surveyed from the rectosigmoid junction through  the left, transverse,  right colon to the area of the appendiceal orifice,  ileocecal valve and cecum.  These structures were well seen and photographed  for the record.  Terminal ileum was intubated to 20 cm.  From this level,  the scope was slowly withdrawn.  All previously mentioned mucosal surfaces  were again seen.  The patient had pancolonic diverticula.  The distal 20 cm  of terminal ileum appeared normal.  The colonic mucosa was well seen coming  out.  The patient had pancolonic diverticula.  Colonic mucosa appeared  normal.  Terminal ileum mucosa appeared normal.  The patient tolerated the  procedure well and was  reactive after endoscopy.   IMPRESSION:  Normal rectum, pancolonic diverticula.  The remainder of the  colonic mucosa appeared normal.  Normal terminal ileum.   RECOMMENDATIONS:  1. Low residue diet.  2. Further recommendations to follow.      [name], M.D.  Electronically Signed     RMR/MEDQ  D:  08/05/2006  T:  08/06/2006  Job:  [id number]   cc:   Sherrilee Gilles. Gerarda Fraction, MD  Fax: [phone]

## 2011-02-20 NOTE — Group Therapy Note (Signed)
NAMESTEFANIA, [name]              ACCOUNT NO.:  [id number]   MEDICAL RECORD NO.:  [id number]          PATIENT TYPE:  INP   LOCATION:  A308                          FACILITY:  [facility]   PHYSICIAN:  Edward L. Luan Pulling, M.D.DATE OF BIRTH:  [birth year]   DATE OF PROCEDURE:  11/18/2006  DATE OF DISCHARGE:                                 PROGRESS NOTE   [name] has had significant cough, congestion and asthma, and she is  overall doing much better now.  Her chest is clearer.  She looks better.  She feels better.   Her exam today showed her temperature is 97.7, pulse 87, respirations  20, blood pressure 136/66, O2 saturations 95%.  Her chest is much  clearer but still has some rhonchi.  She has no wheezes.  Her heart is  regular.  Her abdomen is soft.   ASSESSMENT:  She seems to be better.   PLAN:  Continue her medications and treatments, and apparently she is  set for chest x-ray today, which I think is appropriate, and then decide  what to do from there.      Edward L. Luan Pulling, M.D.  Electronically Signed     ELH/MEDQ  D:  11/18/2006  T:  11/18/2006  Job:  [id number]

## 2011-02-20 NOTE — Op Note (Signed)
[name], [name]              ACCOUNT NO.:  [id number]   MEDICAL RECORD NO.:  [id number]          PATIENT TYPE:  AMB   LOCATION:  DAY                           FACILITY:  [facility]   PHYSICIAN:  Jamesetta So, M.D.  DATE OF BIRTH:  May 12, 1966   DATE OF PROCEDURE:  09/08/2006  DATE OF DISCHARGE:                               OPERATIVE REPORT   PREOPERATIVE DIAGNOSIS:  Chronic cholecystitis.   POSTOPERATIVE DIAGNOSIS:  Chronic cholecystitis.   PROCEDURE:  Laparoscopic cholecystectomy.   SURGEON:  Jamesetta So, M.D.   ASSISTANT:  Reita Cliche. Marnette Burgess, M.D.   ANESTHESIA:  General endotracheal.   INDICATIONS:  The patient is a 45 year old black female who is referred  for evaluation and treatment of biliary colic secondary to chronic  cholecystitis.  The risks and benefits of the procedure including  bleeding, infection, hepatobiliary injury, and the possibility of an  open procedure were fully explained to the patient who gave informed  consent.   PROCEDURE NOTE:  The patient was placed in a supine position.  After  induction of general endotracheal anesthesia, the abdomen was prepped  and draped using the usual sterile technique with Betadine.  Surgical  site confirmation was performed.   A supraumbilical incision was made down to the fascia.  The Veress  needle was introduced into the abdominal cavity and confirmation of  placement was done using the saline drop test.  The abdomen was then  insufflated to 16 mmHg pressure.  An 11 mm trocar was introduced into  the abdominal cavity under direct visualization without difficulty.  The  patient was placed in reversed Trendelenburg position.  An additional 11  mm trocar was placed in the epigastric region and 5 mm trocars were  placed in the right upper quadrant and right flank regions.  The liver  was inspected and noted to be normal limits.  The gallbladder was  retracted superiorly and laterally.  Of note was the fact that  the  patient appeared to have malformed rib close to the costal margin.  Whether this was a fractured rib from the past could not be fully  elucidated.  Pictures were taken.  The cystic duct was first identified.  Its juncture to the infundibulum was fully identified.  Endoclips were  placed proximally and distally on the cystic duct and the cystic duct  was divided.  This was, likewise, done on the cystic artery.  The  gallbladder was then freed away from the gallbladder fossa using Bovie  electrocautery.  The gallbladder was delivered through the epigastric  trocar site using an EndoCatch bag.  The gallbladder fossa was  inspected.  No abnormal bleeding or bile leakage was noted.  Surgicel  was placed in the gallbladder fossa.  All fluid and air were then  evacuated from the abdominal cavity prior to removal of the trocars.   All wounds were irrigated with normal saline.  All wounds were injected  with 0.5% Sensorcaine.  The supraumbilical fascia was reapproximated  using an 0 Vicryl interrupted suture.  All skin incisions were closed  using staples.  Betadine  ointment and dry, sterile dressings were  applied.  All tape and needle counts were correct at the end of the  procedure.  The patient was extubated in the operating room and went  back to the recovery room awake in stable condition.   COMPLICATIONS:  None.   SPECIMEN:  Gallbladder.   ESTIMATED BLOOD LOSS:  Minimal.      Jamesetta So, M.D.  Electronically Signed     MAJ/MEDQ  D:  09/08/2006  T:  09/08/2006  Job:  [id number]   cc:   Hildred Laser, M.D.  [address]  [place]  [place] [address]   Edward L. Luan Pulling, M.D.  Fax: [phone]

## 2011-02-20 NOTE — Procedures (Signed)
NAME:  [name], [name]                        ACCOUNT NO.:  [id number]   MEDICAL RECORD NO.:  [id number]                   PATIENT TYPE:  OUT   LOCATION:  RAD                                  FACILITY:  [facility]   PHYSICIAN:  Scarlett Presto, M.D.                DATE OF BIRTH:  04/22/1966   DATE OF PROCEDURE:  DATE OF DISCHARGE:                                  ECHOCARDIOGRAM   TAPE NUMBER:  LB [id number], tape count [id number] through 3832.   INDICATION:  This is a 45 year old female with chest pain.   TECHNICAL QUALITY:  Poor.   M-MODE TRACINGS:  1. The aorta is 26 mm.  2. The left atrium is 45 mm.  3. The septum is 12 mm.  4. The posterior wall is 11 mm.  5. The left ventricular diastolic dimension is 46 mm.  6. The left ventricular systolic dimension is 36 mm.   2-D AND DOPPLER IMAGING:  1. The left ventricle is normal size with mildly depressed left ventricular     systolic function estimated at 45 to 50%.  There is a question of     inferior wall hypokinesis which is difficult to assess as the endocardial     definition is poor.  Diastolic function appears to be mildly impaired as     well.  The right ventricle appears to be top limits of normal with normal     systolic function.  Both atria appear to be mildly enlarged.  2. The aortic valve is mildly sclerotic with no evidence of stenosis or     regurgitation.  3. The mitral valve has mild regurgitation but no annular calcification.  4. The tricuspid valve has mild regurgitation, and the annulus appears to be     mildly dilated.  5. The pulmonic valve is not well seen.  6. Ascending aorta was not well seen.  7. The inferior vena cava was not well seen.  8. The pericardial structures appeared normal.      ___________________________________________                                            Scarlett Presto, M.D.   JH/MEDQ  D:  12/19/2003  T:  12/19/2003  Job:  [id number]

## 2011-02-20 NOTE — Op Note (Signed)
[name], [name]              ACCOUNT NO.:  [id number]   MEDICAL RECORD NO.:  [id number]          PATIENT TYPE:  AMB   LOCATION:  DAY                           FACILITY:  [facility]   PHYSICIAN:  Hildred Laser, M.D.    DATE OF BIRTH:  11/12/1965   DATE OF PROCEDURE:  02/17/2005  DATE OF DISCHARGE:  02/17/2005                                 OPERATIVE REPORT   OPERATION/PROCEDURE:  Esophageal manometry.   INDICATIONS:  [name] is a 45 year old female with atypical chest pain felt  to be noncardiac.  She has been on PPI but remains with intermittent pain.  She also has discomfort at the old sternal epigastric area felt to be  sensitive at the xiphisternum.   Procedure was reviewed with the patient and informed consent was obtained.   FINDINGS:  Esophageal body:  Nine swallows peristaltic. One swallow was low  amplitude.   Mean amplitude:  At 13 cm, 52.7 mmHg; at 8 cm, 46.1, mmHg;  at 3 cm, 43.5  mmHg; __________ 44.8 mmHg.   Mean duration:  6.4 seconds at 13 cm, 4.5 seconds at 8 cm, 3.8 seconds at 3  cm.  Mean and distal __________  4.1 seconds.   Lower esophageal sphincter located between 43 to 46 cm from the nares.   Mean resting LES pressure is 8.7 mmHg.  Relaxation is 100%.   IMPRESSION:  1. Abnormal study with low mean resting lower esophageal sphincter      pressure.  2. 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     PLAN:  Will proceed with [place] study.      NR/MEDQ  D:  02/22/2005  T:  02/23/2005  Job:  [id number]   cc:   Glori Bickers, M.D. [facility]   Halford Chessman, M.D.  Fax: [phone]

## 2011-02-20 NOTE — Discharge Summary (Signed)
[name], [name]              ACCOUNT NO.:  [id number]   MEDICAL RECORD NO.:  [id number]          PATIENT TYPE:  INP   LOCATION:  A310                          FACILITY:  [facility]   PHYSICIAN:  Sherrilee Gilles. Gerarda Fraction, MD  DATE OF BIRTH:  December 16, 1965   DATE OF ADMISSION:  08/02/2006  DATE OF DISCHARGE:  11/03/2007LH                                 DISCHARGE SUMMARY   DISCHARGE MEDICATIONS:  1. Bentyl 20 mg p.o. q.i.d. p.r.n.  2. Zofran 4 mg p.o. q.4-6 h. p.r.n. nausea and vomiting.  3. Protonix 40 mg p.o. daily.  4. Lorcet Plus one or two q.4-6 h. p.r.n. pain, max 8 per day.   DISCHARGE DIAGNOSES:  1. Abdominal pain.  2. Intractable diarrhea.  3. Unexplained weight loss.  4. Nonspecific mesenteric adenopathy.  5. Nonspecific terminal ileal wall prominence.   DISCHARGE MEDICATIONS:  As above.   SUMMARY:  The patient was admitted with intractable diarrhea and abdominal  pain.  A full workup was instituted including GI consultation.  Radiographic  studies revealed the above findings, nonspecific in nature.  She gradually  got resolution of her symptoms and diarrhea.  Stool studies were negative,  endoscopy negative.  Sprue serology was obtained and pending.   FOLLOWUP:  She will be followed by both GI and in the office post discharge.      Sherrilee Gilles. Gerarda Fraction, MD  Electronically Signed     LJF/MEDQ  D:  08/20/2006  T:  08/21/2006  Job:  [id number]

## 2011-03-17 ENCOUNTER — Ambulatory Visit (INDEPENDENT_AMBULATORY_CARE_PROVIDER_SITE_OTHER): Payer: 59 | Admitting: Internal Medicine

## 2011-03-17 DIAGNOSIS — K5090 Crohn's disease, unspecified, without complications: Secondary | ICD-10-CM

## 2011-06-12 ENCOUNTER — Other Ambulatory Visit (HOSPITAL_COMMUNITY): Payer: Self-pay | Admitting: Internal Medicine

## 2011-06-12 ENCOUNTER — Ambulatory Visit (HOSPITAL_COMMUNITY)
Admission: RE | Admit: 2011-06-12 | Discharge: 2011-06-12 | Disposition: A | Discharge status: Home / Self Care | Payer: 59 | Source: Ambulatory Visit | Attending: Internal Medicine | Admitting: Internal Medicine

## 2011-06-12 DIAGNOSIS — R0789 Other chest pain: Secondary | ICD-10-CM | POA: Insufficient documentation

## 2011-06-12 DIAGNOSIS — R079 Chest pain, unspecified: Secondary | ICD-10-CM

## 2011-06-12 DIAGNOSIS — R0602 Shortness of breath: Secondary | ICD-10-CM | POA: Insufficient documentation

## 2011-06-12 DIAGNOSIS — R05 Cough: Secondary | ICD-10-CM | POA: Insufficient documentation

## 2011-06-12 DIAGNOSIS — R059 Cough, unspecified: Secondary | ICD-10-CM | POA: Insufficient documentation

## 2011-07-07 LAB — DIFFERENTIAL
Lymphocytes Relative: 33
Lymphs Abs: 1.5
Neutrophils Relative %: 57

## 2011-07-07 LAB — POCT CARDIAC MARKERS
CKMB, poc: <1 — ABNORMAL LOW
Myoglobin, poc: 58
Troponin i, poc: <0.05

## 2011-07-07 LAB — CBC
Platelets: 337
RBC: 4.22
WBC: 4.6

## 2011-07-07 LAB — BASIC METABOLIC PANEL
BUN: 7
Creatinine, Ser: 0.93
GFR calc Af Amer: >60
GFR calc non Af Amer: >60
Potassium: 3.5

## 2011-07-07 LAB — D-DIMER, QUANTITATIVE: D-Dimer, Quant: 0.43

## 2011-07-16 ENCOUNTER — Ambulatory Visit (HOSPITAL_COMMUNITY)
Admission: RE | Admit: 2011-07-16 | Discharge: 2011-07-16 | Disposition: A | Discharge status: Home / Self Care | Payer: 59 | Source: Ambulatory Visit | Attending: Internal Medicine | Admitting: Internal Medicine

## 2011-07-16 ENCOUNTER — Encounter (HOSPITAL_COMMUNITY): Payer: Self-pay | Admitting: Internal Medicine

## 2011-07-16 ENCOUNTER — Encounter: Payer: Self-pay | Admitting: *Deleted

## 2011-07-16 ENCOUNTER — Encounter: Payer: Self-pay | Admitting: Internal Medicine

## 2011-07-16 ENCOUNTER — Other Ambulatory Visit: Payer: Self-pay

## 2011-07-16 VITALS — BP 124/82 | HR 77 | Wt 244.5 lb

## 2011-07-16 DIAGNOSIS — R079 Chest pain, unspecified: Secondary | ICD-10-CM | POA: Insufficient documentation

## 2011-07-16 DIAGNOSIS — R05 Cough: Secondary | ICD-10-CM | POA: Insufficient documentation

## 2011-07-16 DIAGNOSIS — R0609 Other forms of dyspnea: Secondary | ICD-10-CM | POA: Insufficient documentation

## 2011-07-16 DIAGNOSIS — R059 Cough, unspecified: Secondary | ICD-10-CM | POA: Insufficient documentation

## 2011-07-16 DIAGNOSIS — R002 Palpitations: Secondary | ICD-10-CM | POA: Insufficient documentation

## 2011-07-16 DIAGNOSIS — E669 Obesity, unspecified: Secondary | ICD-10-CM | POA: Insufficient documentation

## 2011-07-16 DIAGNOSIS — Z79899 Other long term (current) drug therapy: Secondary | ICD-10-CM | POA: Insufficient documentation

## 2011-07-16 DIAGNOSIS — R0989 Other specified symptoms and signs involving the circulatory and respiratory systems: Secondary | ICD-10-CM | POA: Insufficient documentation

## 2011-07-16 DIAGNOSIS — K449 Diaphragmatic hernia without obstruction or gangrene: Secondary | ICD-10-CM | POA: Insufficient documentation

## 2011-07-16 DIAGNOSIS — R072 Precordial pain: Secondary | ICD-10-CM

## 2011-07-16 HISTORY — DX: Obesity, unspecified: E66.9

## 2011-07-16 HISTORY — DX: Palpitations: R00.2

## 2011-07-16 HISTORY — DX: Diaphragmatic hernia without obstruction or gangrene: K44.9

## 2011-07-16 HISTORY — DX: Dyspnea, unspecified: R06.00

## 2011-07-16 NOTE — Progress Notes (Signed)
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 She has a h/o CP and dyspnea and underwent cath in 2005 with normal cors.   Had recurrent problems with CP, palpitations and SOB with epsiode of hypoxemia while at work last year.  Underwent repeat cath in February 2011 with normal cors and only minimally elevated pulmonary pressures. Symptoms felt to be due to stress. Central aortic pressure 147/84 with a mean of 113. LV pressure 133/12 with EDP of 19.  Right atrial pressure mean of  8.  RV pressure 34/6 with EDP of 11.  PA pressure 33/15 with a mean of 24.  Pulmonary capillary wedge pressure mean of 13.  Fick cardiac output was 9.7 L per minute with a cardiac index of 4.2 L. Thermodilution cardiac output 7.3 L per minute with a cardiac index of 3.1 Pulmonary vascular resistance by the Fick method was 1.1 Woods, by thermodilution method was 1.5 Woods.  There was no aortic valve gradient on pullback.  Has been feeling pretty good. On August 29 was eating salad and aspirated while talking. Since that time feels like she has something stuck in the center of her chest. More dyspneic. + cough. Active at work, moving all the time. However has to stop and take breaks at time. Had CXR with PCP and looked good. Still with occasional cough. PCP gave nebs. Able to dance at class reunion on Saturday without too much difficulty. Still feels funny if she takes a deep breath. No edema, orthopnea or PND.   Did have a day with SBP was 180. Noted sats to be 94%. Said she was the whole night before.   ROS: All systems negative except as listed in HPI, PMH and Problem List.  Past Medical History  Diagnosis Date  . Chest pain     cath 2005 norm cors, repeat cath Feb 2011 normal cors and only minimally  elevated pulmonary pressures  . Dyspnea   . Palpitations   . Obesity   . Hiatal hernia     Current Outpatient Prescriptions  Medication Sig Dispense Refill  . albuterol (PROVENTIL) (2.5  MG/3ML) 0.083% nebulizer solution Take 2.5 mg by nebulization every 6 (six) hours as needed.        . budesonide (ENTOCORT EC) 3 MG 24 hr capsule Take 3 mg by mouth every morning.        Marland Kitchen esomeprazole (NEXIUM) 40 MG capsule Take 40 mg by mouth daily before breakfast.        . hyoscyamine (ANASPAZ) 0.125 MG TBDP Place 0.125 mg under the tongue 4 (four) times daily.           PHYSICAL EXAM: Filed Vitals:   07/16/11 1255  BP: 124/82  Pulse: 77   Gen: well appearing. no resp difficulty HEENT: normal Neck: supple. no JVD. Carotids 2+ bilat; no bruits. No lymphadenopathy or thryomegaly appreciated. Cor: PMI nondisplaced. Regular rate & rhythm. No rubs, gallops, murmur. Lungs: clear Abdomen: obese. soft, nontender, nondistended. Normal bowel sounds. Extremities: no cyanosis, clubbing, rash, edema. Groin ok Neuro: alert & orientedx3, cranial nerves grossly intact. moves all 4 extremities w/o difficulty. affect pleasant   ECG: NSR 68 flat T waves   ASSESSMENT & PLAN:

## 2011-07-16 NOTE — Assessment & Plan Note (Signed)
I suspect this is related to her aspiration event - which seems pretty dramatic. Seems to be improving some. Doubt this is cardiac in nature given results of her recent cath.  If not improving may need to see pulmonary.

## 2011-07-17 LAB — URINALYSIS, ROUTINE W REFLEX MICROSCOPIC
Bilirubin Urine: NEGATIVE
Hgb urine dipstick: NEGATIVE
Specific Gravity, Urine: 1.02
pH: 6.5

## 2011-07-17 LAB — POCT CARDIAC MARKERS
CKMB, poc: <1 — ABNORMAL LOW
Myoglobin, poc: 59.4
Troponin i, poc: <0.05

## 2011-07-17 LAB — COMPREHENSIVE METABOLIC PANEL
ALT: 16
Alkaline Phosphatase: 43
Glucose, Bld: 109 — ABNORMAL HIGH
Potassium: 3.7
Sodium: 140
Total Protein: 6.8

## 2011-07-17 LAB — BASIC METABOLIC PANEL
Calcium: 8.8
GFR calc Af Amer: >60
GFR calc non Af Amer: >60
Sodium: 140

## 2011-07-17 LAB — DIFFERENTIAL
Basophils Relative: 1
Eosinophils Absolute: 0.2
Monocytes Absolute: 0.6
Monocytes Relative: 8
Neutrophils Relative %: 63

## 2011-07-17 LAB — CBC
Hemoglobin: 12.2
RBC: 4.28
RDW: 13.7
WBC: 6.9

## 2011-07-17 LAB — RAPID URINE DRUG SCREEN, HOSP PERFORMED
Barbiturates: NOT DETECTED
Opiates: NOT DETECTED

## 2011-07-17 LAB — PROTIME-INR: INR: 0.9

## 2011-08-06 ENCOUNTER — Emergency Department (HOSPITAL_COMMUNITY): Payer: 59

## 2011-08-06 ENCOUNTER — Encounter (HOSPITAL_COMMUNITY): Payer: Self-pay | Admitting: *Deleted

## 2011-08-06 ENCOUNTER — Other Ambulatory Visit: Payer: Self-pay

## 2011-08-06 ENCOUNTER — Emergency Department (HOSPITAL_COMMUNITY)
Admission: EM | Admit: 2011-08-06 | Discharge: 2011-08-06 | Disposition: A | Discharge status: Home / Self Care | Payer: 59 | Attending: Emergency Medicine | Admitting: Emergency Medicine

## 2011-08-06 DIAGNOSIS — W108XXA Fall (on) (from) other stairs and steps, initial encounter: Secondary | ICD-10-CM | POA: Insufficient documentation

## 2011-08-06 DIAGNOSIS — M542 Cervicalgia: Secondary | ICD-10-CM | POA: Insufficient documentation

## 2011-08-06 DIAGNOSIS — M25569 Pain in unspecified knee: Secondary | ICD-10-CM | POA: Insufficient documentation

## 2011-08-06 DIAGNOSIS — R55 Syncope and collapse: Secondary | ICD-10-CM | POA: Insufficient documentation

## 2011-08-06 DIAGNOSIS — K449 Diaphragmatic hernia without obstruction or gangrene: Secondary | ICD-10-CM | POA: Insufficient documentation

## 2011-08-06 DIAGNOSIS — E860 Dehydration: Secondary | ICD-10-CM | POA: Insufficient documentation

## 2011-08-06 DIAGNOSIS — N289 Disorder of kidney and ureter, unspecified: Secondary | ICD-10-CM | POA: Insufficient documentation

## 2011-08-06 DIAGNOSIS — R51 Headache: Secondary | ICD-10-CM | POA: Insufficient documentation

## 2011-08-06 LAB — POCT I-STAT, CHEM 8
BUN: 14 mg/dL (ref 6–23)
Creatinine, Ser: 1.2 mg/dL — ABNORMAL HIGH (ref 0.50–1.10)
Sodium: 142 mEq/L (ref 135–145)
TCO2: 25 mmol/L (ref 0–100)

## 2011-08-06 MED ORDER — SODIUM CHLORIDE 0.9 % IV BOLUS (SEPSIS)
1000.0000 mL | Freq: Once | INTRAVENOUS | Status: AC
  Administered 2011-08-06: 1000 mL via INTRAVENOUS

## 2011-08-06 MED ORDER — ACETAMINOPHEN 325 MG PO TABS
650.0000 mg | ORAL_TABLET | Freq: Once | ORAL | Status: AC
  Administered 2011-08-06: 650 mg via ORAL
  Filled 2011-08-06: qty 2

## 2011-08-06 MED ORDER — HYDROCODONE-ACETAMINOPHEN 5-325 MG PO TABS: 2.0000 | ORAL_TABLET | ORAL | Status: AC | PRN

## 2011-08-06 NOTE — ED Notes (Signed)
Patient is comfortable does not need anything at this time.

## 2011-08-06 NOTE — ED Notes (Signed)
Pt reports she was walking up the steps and had a syncopal episode hitting face on the wall, pt reports brief loc and can not remember what happened

## 2011-08-06 NOTE — ED Provider Notes (Signed)
Scribed for [name] Lanes, MD, the patient was seen in room APA07/APA07 . This chart was scribed by Glory Buff.   CSN: [id number] Arrival date & time: 08/06/2011  7:39 PM   First MD Initiated Contact with Patient 08/06/11 2105      Chief Complaint  Patient presents with  . Near Syncope    (Consider location/radiation/quality/duration/timing/severity/associated sxs/prior treatment) HPI [name] is a 45 y.o. female who presents to the Emergency Department complaining of near syncope. Pt was going up steps (2 flights of stairs) to her apartment and says she fell face forward into wall hitting her knee and face against the wall ~2 hours ago. Pt c/o knee, face, and neck pain. Pain is constant since fall. Described as severe.  Pt also reports visual disturbance after the fall. Pt is unsure of LOC. Pt denies history of syncope or seizures.  Pt also reports she had root canal yesterday and hasn't been eating much. Pt denies urinary or bowel incontinence.   Past Medical History  Diagnosis Date  . Chest pain     cath 2005 norm cors, repeat cath Feb 2011 normal cors and only minimally  elevated pulmonary pressures  . Dyspnea   . Palpitations   . Obesity   . Hiatal hernia     Past Surgical History  Procedure Date  . Cesarean section   . Cholecystectomy   . Abdominal hysterectomy   . Shoulder surgery     right    Family History  Problem Relation Age of Onset  . Hypertension Mother   . Lupus Father   . COPD Father     History  Substance Use Topics  . Smoking status: Never Smoker   . Smokeless tobacco: Not on file  . Alcohol Use: No    Review of Systems 10 Systems reviewed and are negative for acute change except as noted in the HPI.   Allergies  Hydromorphone hcl; Omnipaque; and Wasp venom  Home Medications   Current Outpatient Rx  Name Route Sig Dispense Refill  . ACETAMINOPHEN 500 MG PO TABS Oral Take 1,000-2,000 mg by mouth as needed. For pain     .  BUDESONIDE 3 MG PO CP24 Oral Take 3 mg by mouth every morning. Take 16m for 2 weeks then take 672mdaily thereafter    . CETIRIZINE HCL 10 MG PO TABS Oral Take 10 mg by mouth daily as needed. For allergie    . VITAMIN D-3 PO Oral Take 1 capsule by mouth daily.      . B-12 PO Oral Take 1 tablet by mouth daily.      . CYCLOBENZAPRINE HCL 10 MG PO TABS Oral Take 10 mg by mouth daily as needed. For muscle spasms     . ESOMEPRAZOLE MAGNESIUM 40 MG PO CPDR Oral Take 40 mg by mouth daily before breakfast.      . HYOSCYAMINE SULFATE 0.125 MG PO TBDP Sublingual Place 0.125 mg under the tongue 4 (four) times daily as needed. For GI    . ALBUTEROL SULFATE (2.5 MG/3ML) 0.083% IN NEBU Nebulization Take 2.5 mg by nebulization every 6 (six) hours as needed. For shortness of breath      BP 155/96  Pulse 95  Resp 20  Ht 6' (1.829 m)  Wt 240 lb (108.863 kg)  BMI 32.55 kg/m2  SpO2 98%  Physical Exam  Nursing note and vitals reviewed. Constitutional: She is oriented to person, place, and time. She appears well-developed and well-nourished.  HENT:  Head: Normocephalic and atraumatic.  Eyes: Conjunctivae are normal. No scleral icterus.  Neck: Normal range of motion. Neck supple.  Cardiovascular: Normal rate, regular rhythm and normal heart sounds.   Pulmonary/Chest: Effort normal and breath sounds normal.  Abdominal: Soft. There is no tenderness.  Musculoskeletal: Normal range of motion. She exhibits tenderness.       Right knee pain with flexion. No gross deformity.   Neurological: She is alert and oriented to person, place, and time.  Skin: Skin is warm and dry.  Psychiatric: She has a normal mood and affect.    ED Course  Procedures (including critical care time) OTHER DATA REVIEWED: Nursing notes, vital signs, and past medical records reviewed.  DIAGNOSTIC STUDIES: Oxygen Saturation is 98% on room air, normal by my interpretation.     Labs Reviewed  GLUCOSE, CAPILLARY  POCT CBG MONITORING     Ct Head Wo Contrast  08/06/2011  *RADIOLOGY REPORT*  Clinical Data: Near-syncope, fell down stairs, hit head on wall, no loss of consciousness  CT HEAD WITHOUT CONTRAST  Technique:  Contiguous axial images were obtained from the base of the skull through the vertex without contrast.  Comparison: 05/27/2007  Findings: Normal ventricular morphology. No midline shift or mass effect. Normal appearance of brain parenchyma. No intracranial hemorrhage, mass lesion or evidence of acute infarction. No extra-axial fluid collections. Visualized paranasal sinuses and mastoid air cells clear. Skull intact.  IMPRESSION: No acute intracranial abnormalities.  Original Report Authenticated By: Burnetta Sabin, M.D.    Diagnoses: 1 syncope 2 dehydration 3 renal insufficiency  MDM  I personally performed the services described in this documentation, which was scribed in my presence. The recorded information has been reviewed and considered.          [name] Lanes, MD 08/06/11 2225

## 2011-08-06 NOTE — ED Notes (Signed)
IV d/c'd; catheter intact and site WNL.  Discharge instructions given and reviewed with patient.  Prescription given for Hydrocodone; effects and use explained.  Patient verbalized understanding of sedating effects.  Patient discharged home in good condition.  Patient left via wheelchair.

## 2011-08-06 NOTE — ED Notes (Signed)
CBG 72

## 2011-10-06 HISTORY — PX: KNEE ARTHROSCOPY: SUR90

## 2011-11-11 ENCOUNTER — Encounter: Payer: Self-pay | Admitting: Orthopedic Surgery

## 2011-11-11 ENCOUNTER — Ambulatory Visit (INDEPENDENT_AMBULATORY_CARE_PROVIDER_SITE_OTHER): Payer: 59 | Admitting: Orthopedic Surgery

## 2011-11-11 VITALS — BP 130/86 | Ht 71.5 in | Wt 251.0 lb

## 2011-11-11 DIAGNOSIS — M6789 Other specified disorders of synovium and tendon, multiple sites: Secondary | ICD-10-CM

## 2011-11-11 DIAGNOSIS — M76829 Posterior tibial tendinitis, unspecified leg: Secondary | ICD-10-CM | POA: Insufficient documentation

## 2011-11-11 DIAGNOSIS — M25569 Pain in unspecified knee: Secondary | ICD-10-CM

## 2011-11-11 DIAGNOSIS — M25561 Pain in right knee: Secondary | ICD-10-CM | POA: Insufficient documentation

## 2011-11-11 MED ORDER — METHYLPREDNISOLONE 4 MG PO KIT: PACK | ORAL | Status: AC

## 2011-11-11 NOTE — Progress Notes (Signed)
Patient ID: [name], female   DOB: September 03, 1966, 46 y.o.   MRN: [id number] x Subjective:    [name] is a 46 y.o. female with a history of bilateral patellofemoral chondromalacia/arthritis he presents now with pain in her LEFT foot and RIGHT knee.  We will address the LEFT foot first, approximately 3 weeks ago the patient started having pain in her LEFT foot along the medial side of the ankle associated with throbbing pain and swelling rated 8/10 associated with getting out of bed in the morning and placing her foot on the floor.  She has a high pain tolerance and thought that if she was having as much pain she needs to get it checked out  RIGHT knee she fell several weeks ago now complains of medial knee pain swelling pain radiates proximally as a pulling sensation over the medial aspect of the knee associated with burning.  Date of injury November 1.  Review of systems chills and fatigue, ordering of the eyes, palpitations recent fainting episode, shortness of breath, heartburn, nausea, constipation and diarrhea.  Frequency urgency difficult urination.  Numbness and tingling dizziness.  Easy bruising excessive thirst temperature intolerance seasonal ALLERGIES   The following portions of the patient's history were reviewed and updated as appropriate: allergies, current medications, past family history, past medical history, past social history, past surgical history and problem list.  Review of Systems as noted    Objective:    BP 130/86  Ht 5' 11.5" (1.816 m)  Wt 251 lb (113.853 kg)  BMI 34.52 kg/m2  BP 130/86  Ht 5' 11.5" (1.816 m)  Wt 251 lb (113.853 kg)  BMI 34.52 kg/m2 Normal appearance, normal grooming, normal hygiene.  Distal pulses and perfusion normal in both lower extremities.  There is no lymphadenopathy, no lymphangitis.  Skin warm dry and intact without rash lesion or ulceration in her lower extremities.  There are no neurologic deficits in the lower  extremities.  The patient is awake alert and oriented x3 with mood and affect pleasant  Ambulation is notable for a limp favoring the LEFT lower extremity.  RIGHT knee exam tenderness over the medial joint line and medial femoral condyle with crepitance in the patellofemoral joint.  Range of motion remains normal with painful flexion arc 90-120.  Strength normal.  Knee stable. McMurray sign positive.     Left foot:  alignment of the foot is flexible pes planus with a too many toes sign.  Normal range of motion at the ankle painful E. version at the subtalar joint.  Weakness of the posterior tibial tendon.  The failed a single leg heel raise.  Ankle joint stable.   Imaging:   Assessment:    posterior tibial tendon dysfunction LEFT foot  Medial meniscal tear RIGHT knee vs. Exacerbation of chondromalacia vs. Exacerbation of patellofemoral arthritis vs. Tibial femoral arthritis RIGHT knee   Plan:    Natural history and expected course discussed. Questions answered. NSAIDs per medication orders. PT referral. Cam Walker x6 weeks   Inject RIGHT knee  Knee  Injection Procedure Note  Pre-operative Diagnosis: right knee oa  Post-operative Diagnosis: same  Indications: pain  Anesthesia: ethyl chloride   Procedure Details   Verbal consent was obtained for the procedure. Time out was completed.The joint was prepped with alcohol, followed by  Ethyl chloride spray and A 20 gauge needle was inserted into the knee via lateral approach; 44m 1% lidocaine and 1 ml of depomedrol  was then injected into the  joint . The needle was removed and the area cleansed and dressed.  Complications:  None; patient tolerated the procedure well.

## 2011-11-11 NOTE — Patient Instructions (Signed)
You have received a steroid shot. 15% of patients experience increased pain at the injection site with in the next 24 hours. This is best treated with ice and tylenol extra strength 2 tabs every 8 hours. If you are still having pain please call the office.   Wear brace on foot may remove for driving

## 2011-11-18 ENCOUNTER — Ambulatory Visit: Payer: 59 | Admitting: Orthopedic Surgery

## 2011-11-18 ENCOUNTER — Ambulatory Visit (HOSPITAL_COMMUNITY)
Admission: RE | Admit: 2011-11-18 | Discharge: 2011-11-18 | Disposition: A | Discharge status: Home / Self Care | Payer: 59 | Source: Ambulatory Visit | Attending: Orthopedic Surgery | Admitting: Orthopedic Surgery

## 2011-11-18 DIAGNOSIS — M25579 Pain in unspecified ankle and joints of unspecified foot: Secondary | ICD-10-CM | POA: Insufficient documentation

## 2011-11-18 DIAGNOSIS — M25673 Stiffness of unspecified ankle, not elsewhere classified: Secondary | ICD-10-CM | POA: Insufficient documentation

## 2011-11-18 DIAGNOSIS — M6281 Muscle weakness (generalized): Secondary | ICD-10-CM | POA: Insufficient documentation

## 2011-11-18 DIAGNOSIS — IMO0001 Reserved for inherently not codable concepts without codable children: Secondary | ICD-10-CM | POA: Insufficient documentation

## 2011-11-18 DIAGNOSIS — I10 Essential (primary) hypertension: Secondary | ICD-10-CM | POA: Insufficient documentation

## 2011-11-18 DIAGNOSIS — M25676 Stiffness of unspecified foot, not elsewhere classified: Secondary | ICD-10-CM | POA: Insufficient documentation

## 2011-11-18 NOTE — Evaluation (Signed)
Physical Therapy Evaluation  Patient Details  Name: [name] MRN: [id number] Date of Birth: Nov 16, 1965  Today's Date: 11/18/2011 Time: 2025-4270 Time Calculation (min): 45 min Charges: 1 EV, 10 TE Visit#: 1  of 8   Re-eval: 12/18/11  Assessment Diagnosis: Left ankle posterior tibial tendonitits Next MD Visit: 12/09/11 Prior Therapy: none  Past Medical History:  Past Medical History  Diagnosis Date  . Chest pain     cath 2005 norm cors, repeat cath Feb 2011 normal cors and only minimally  elevated pulmonary pressures  . Dyspnea   . Palpitations   . Obesity   . Hiatal hernia    Past Surgical History:  Past Surgical History  Procedure Date  . Cesarean section   . Cholecystectomy   . Abdominal hysterectomy   . Shoulder surgery     right    Subjective Symptoms/Limitations Symptoms: Pain left ankle on/off for several months, progressed to constant pain ~ 4 weeks.  L worse than R.  In boot on the left for 6 weeks.   How long can you sit comfortably?: no problems, execept pain doesn't go away.   How long can you stand comfortably?: immediate pain How long can you walk comfortably?: immediate pain.  Repetition: Increases Symptoms Special Tests: Likes to walk, working out to lose weight.   Pain Assessment Currently in Pain?: Yes Pain Score:   2 Pain Location: Ankle Pain Orientation: Left Pain Type: Chronic pain Pain Onset: More than a month ago Pain Frequency: Constant Pain Relieving Factors: rest (get off of her feet), 800 mg ibuprofen only if needed,  Effect of Pain on Daily Activities: pain at work, pain at night,   Precautions/Restrictions  Precautions Required Braces or Orthoses: Yes Other Brace/Splint: boot x 6 weeks doesn't have to wear it driving or at night.  Remove for therapy.    Prior Functioning  Home Living Additional Comments: reports family history of bad arthritis.  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   Comments: misses working out, she is trying to lose weight.    Cognition/Observation Observation/Other Assessments Observations: in standing, bare feet, increased pronation bil, with no arch, navicular bone sits on the floor.  Patient reports she has been flat footed her whole life and that after being pregnant it got worse.  Her shoe size increased by 1/2 to 10.5 Other Assessments: First step in AM pain, will reasearch night splints for the patient.  Sensation/Coordination/Flexibility/Functional Tests Functional Tests Functional Tests: unable to do SLS secondary to pain with WB left with boot off.    Assessment LLE Strength Left Ankle Dorsiflexion: 3+/5 Left Ankle Plantar Flexion: 3+/5 Left Ankle Inversion: 4/5 Left Ankle Eversion: 4/5 Palpation Palpation: tender to touch both posterior tibial tendon and anterior tibialis tendon on her left foot.    Exercise/Treatments Ankle Stretches Gastroc Stretch: 2 reps;30 seconds;Limitations Gastroc Stretch Limitations: in long sitting Ankle Exercises - Seated ABC's: 1 rep;Limitations ABC's Limitations: attempted, but stopped secondary to patient had too much compensation at the knee Towel Crunch: 2 reps Other Seated Ankle Exercises: AROM PF/DF x 10, Inv/Ever x 10  Physical Therapy Assessment and Plan Clinical Impression Statement: 46 y.o. female comes to [facility] OP PT with reports of left medial ankle pain.  She presents with both posterior and anterior tibial tendon irritation and pain, decreased left ankle strength, inability to WB without protective boot on left ankle and decreased balance on left  ankle.   ROM  seems to still be grossly intact.  The patient would benefit from skilled PT to increase strength, decrease pain and increase balance.   Rehab Potential: Good PT Frequency: Min 1X/week (patient's preference secondary to co-pay) PT Duration: 8 weeks PT Treatment/Interventions: Gait training;Stair  training;Functional mobility training;Therapeutic activities;Therapeutic exercise;Balance training;Neuromuscular re-education;Patient/family education;Other (comment) (modalities, STM, joint mobs as needed for pain) PT Plan: Continue 1 xs/wk (per patient request secondary to co-pay) x 8 weeks, add seated heel and toe raises, seated BAPS level 1, marbles, AROM CW/CCW circles, towel inversion and eversion, review HEP (gastroc stretch in long sitting with towel, AROM all directions, towel toe crunches), end session with pulsed [place] and ice.  Give patient printed info re-night splint.      Goals Home Exercise Program Pt will Perform Home Exercise Program: Independently PT Short Term Goals Time to Complete Short Term Goals: 4 weeks PT Short Term Goal 1: The patient will report less than 5/10 pain at the end of her shift at work to show improved pain levels and increased QOL PT Short Term Goal 2: The patient will increase her left ankle strength by 1/2 muscle grade to increase ankle stability and strength and start to prepare her for time outside of the boot in 2 weeks.   PT Short Term Goal 3: The patient will be able to WB on left foot only with bil hand support and reports of less than 5/10 pain in left ankle for 20 seconds to show improved tolerance of WB and continued preparation for being out of the boot.   PT Long Term Goals Time to Complete Long Term Goals: 8 weeks PT Long Term Goal 1: The patient will report less than a 3/10 pain at the end of her 12 hour shift not wearing the boot to show improved tolerance of work activities.   PT Long Term Goal 2: The patient will increase left ankle strength to 5/5 throughout to show improved ankle stability and strength so that she can safely go up and down one flight of stairs.   Long Term Goal 3: The patient will be able to go up and down one flight of stairs with no reports of pain reciprocally out of the boot with a handrail to show improved strength and pain   levels.   Long Term Goal 4: The patient will be able to balance greater than or equal to 10 seconds on left foot without upper extremity support to show improved ability to both WB and balance on her left foot.    Problem List Patient Active Problem List  Diagnoses  . HYPERTENSION  . HIATAL HERNIA  . KNEE, ARTHRITIS, DEGEN./OSTEO  . PAIN IN JOINT, MULTIPLE SITES  . PALPITATIONS  . DYSPNEA  . Chest pain  . Posterior tibial tendonitis  . PTTD (posterior tibial tendon dysfunction)  . Knee pain, right   PT - End of Session Activity Tolerance: Patient tolerated treatment well General Behavior During Session: [facility] for tasks performed Cognition: [facility] for tasks performed PT Plan of Care PT Home Exercise Plan: see scanned report Consulted and Agree with Plan of Care: Patient  Barbarann Ehlers. Shaena Parkerson, PT, DPT  11/18/2011, 12:45 PM  Physician Documentation Your signature is required to indicate approval of the treatment plan as stated above.  Please sign and either send electronically or make a copy of this report for your files and return this physician signed original.   Please mark one 1.__approve of plan  2. ___approve of plan with the  following conditions.   ______________________________                                                          _____________________ Physician Signature                                                                                                             Date

## 2011-11-24 ENCOUNTER — Ambulatory Visit (HOSPITAL_COMMUNITY): Payer: 59 | Admitting: Physical Therapy

## 2011-11-25 ENCOUNTER — Ambulatory Visit (HOSPITAL_COMMUNITY): Payer: 59 | Admitting: Physical Therapy

## 2011-11-26 ENCOUNTER — Ambulatory Visit (HOSPITAL_COMMUNITY)
Admission: RE | Admit: 2011-11-26 | Discharge: 2011-11-26 | Disposition: A | Discharge status: Home / Self Care | Payer: 59 | Source: Ambulatory Visit | Attending: Orthopedic Surgery | Admitting: Orthopedic Surgery

## 2011-11-26 NOTE — Progress Notes (Signed)
Physical Therapy Treatment Patient Details  Name: [name] MRN: [id number] Date of Birth: 1966/05/19  Today's Date: 11/26/2011 Time: 5790-3833 Time Calculation (min): 38 min Visit#: 2  of 8   Re-eval: 12/18/11 Charges:  therex 15' , ultrasound 8', ice 10'    Subjective: Symptoms/Limitations Symptoms:   Pt. was 61' late for appt (got her appt. time confused).  Patient reports her pain is worse in the morning and getting up after sitting/standing still a while.  Pt. reports her pain is about the same; she has been compliant with HEP. Pain Assessment Currently in Pain?: Yes Pain Score:   2 Pain Location: Ankle Pain Orientation: Left;Medial   Exercise/Treatments Ankle Stretches Gastroc Stretch: 1 rep;30 seconds Gastroc Stretch Limitations: in standing but pt. prefers long sitting Ankle Exercises - Standing BAPS: Limitations BAPS Limitations: begin next visit L 2 Ankle Exercises - Seated Towel Crunch: Limitations Towel Crunch Limitations: 2 minutes, HEP Towel Inversion/Eversion: 3 reps Marble Pickup: add next visit Heel Raises: Limitations Heel Raises Limitations: add next visit Toe Raise: Limitations Toe Raise Limitations: add next visit Other Seated Ankle Exercises: AROM all 10X    Modalities Modalities: Cryotherapy;Ultrasound Cryotherapy Number Minutes Cryotherapy: 10 Minutes Cryotherapy Location: Ankle Type of Cryotherapy: Ice pack Ultrasound Ultrasound Location: L medial ankle; inferior/posterior medial malleoli Ultrasound Parameters: 1.4w/cm2 with 3 MHz, pulsed X 8 minutes Ultrasound Goals: Pain  Physical Therapy Assessment and Plan PT Assessment and Plan Clinical Impression Statement: Educated pt./given sheets regarding night splints;  Unable to add all new exercises secondary to pt. being late for appt.  Instructed with alternative gastroc stretch in standing, however pt. prefers long sitting due to R knee discomfort. PT Plan: Continue to progress  toward goals, add seated heel/toe raises, marble pick up next visit.     Problem List Patient Active Problem List  Diagnoses  . HYPERTENSION  . HIATAL HERNIA  . KNEE, ARTHRITIS, DEGEN./OSTEO  . PAIN IN JOINT, MULTIPLE SITES  . PALPITATIONS  . DYSPNEA  . Chest pain  . Posterior tibial tendonitis  . PTTD (posterior tibial tendon dysfunction)  . Knee pain, right    PT - End of Session Activity Tolerance: Patient tolerated treatment well General Behavior During Session: [facility] for tasks performed Cognition: [facility] for tasks performed  GP No functional reporting required  Hisae Decoursey B. Mare Ferrari, PTA 11/26/2011, 10:38 AM

## 2011-12-01 ENCOUNTER — Ambulatory Visit (HOSPITAL_COMMUNITY)
Admission: RE | Admit: 2011-12-01 | Discharge: 2011-12-01 | Disposition: A | Discharge status: Home / Self Care | Payer: 59 | Source: Ambulatory Visit | Attending: Family Medicine | Admitting: Family Medicine

## 2011-12-01 NOTE — Progress Notes (Signed)
Physical Therapy Treatment Patient Details  Name: [name] MRN: [id number] Date of Birth: May 12, 1966  Today's Date: 12/01/2011 Time: 9826-4158 Time Calculation (min): 47 min Visit#: 3  of 8   Re-eval: 12/18/11  Charge: therex 38 min [place] 8 min  Subjective: Symptoms/Limitations Symptoms: My foot is very angry, pain increased for the past 3 days.  Pain scale 6-7/10. Pain Assessment Currently in Pain?: Yes Pain Score:   7 Pain Location: Foot Pain Orientation: Left  Objective:   Exercise/Treatments Ankle Stretches Gastroc Stretch: 3 reps;30 seconds;Limitations Gastroc Stretch Limitations: 3 reps standing with slant board, 3 reps long sitting with towel Ankle Exercises - Standing BAPS: Sitting;Level 2;5 reps;Limitations BAPS Limitations: DF/PF; EV/Inv; CW/CCW 5 reps Ankle Exercises - Seated Towel Inversion/Eversion: 5 reps Marble Pickup: 10 marble (5 small/ 5 large)  Heel Raises: 10 reps;Limitations Heel Raises Limitations: seated Toe Raise: 10 reps;Limitations Toe Raise Limitations: seated Other Seated Ankle Exercises: AROM all 10X    Modalities Modalities: Ultrasound Ultrasound Ultrasound Location: L medial ankle; inferior/posterior medial malleoli  Ultrasound Parameters: 1.4w/cm2 with 3 MHz, pulsed X 8 minutes   Physical Therapy Assessment and Plan PT Assessment and Plan Clinical Impression Statement: Added new exercises per PT plan, pt able to complete all exercises with pain increase noted with eversion motions.  Knee immobilized by therapist with BAPS board for more ankle movements. PT Plan: Continue with current POC progressing towards goals, instruct plantar fascia stretch next session.    Goals    Problem List Patient Active Problem List  Diagnoses  . HYPERTENSION  . HIATAL HERNIA  . KNEE, ARTHRITIS, DEGEN./OSTEO  . PAIN IN JOINT, MULTIPLE SITES  . PALPITATIONS  . DYSPNEA  . Chest pain  . Posterior tibial tendonitis  . PTTD (posterior tibial  tendon dysfunction)  . Knee pain, right    PT - End of Session Activity Tolerance: Patient tolerated treatment well General Behavior During Session: [facility] for tasks performed Cognition: [facility] for tasks performed  Aldona Lento, PTA 12/01/2011, 12:50 PM

## 2011-12-02 ENCOUNTER — Ambulatory Visit (HOSPITAL_COMMUNITY): Payer: 59

## 2011-12-08 ENCOUNTER — Ambulatory Visit (HOSPITAL_COMMUNITY): Payer: 59 | Admitting: Physical Therapy

## 2011-12-09 ENCOUNTER — Ambulatory Visit (HOSPITAL_COMMUNITY): Payer: 59

## 2011-12-09 ENCOUNTER — Ambulatory Visit: Payer: 59 | Admitting: Orthopedic Surgery

## 2011-12-15 ENCOUNTER — Ambulatory Visit (HOSPITAL_COMMUNITY): Payer: 59 | Admitting: Physical Therapy

## 2011-12-16 ENCOUNTER — Ambulatory Visit (HOSPITAL_COMMUNITY): Payer: 59 | Admitting: Physical Therapy

## 2011-12-17 ENCOUNTER — Ambulatory Visit (HOSPITAL_COMMUNITY)
Admission: RE | Admit: 2011-12-17 | Discharge: 2011-12-17 | Disposition: A | Discharge status: Home / Self Care | Payer: 59 | Source: Ambulatory Visit | Attending: Orthopedic Surgery | Admitting: Orthopedic Surgery

## 2011-12-17 DIAGNOSIS — M25579 Pain in unspecified ankle and joints of unspecified foot: Secondary | ICD-10-CM | POA: Insufficient documentation

## 2011-12-17 DIAGNOSIS — I10 Essential (primary) hypertension: Secondary | ICD-10-CM | POA: Insufficient documentation

## 2011-12-17 DIAGNOSIS — IMO0001 Reserved for inherently not codable concepts without codable children: Secondary | ICD-10-CM | POA: Insufficient documentation

## 2011-12-17 DIAGNOSIS — M6281 Muscle weakness (generalized): Secondary | ICD-10-CM | POA: Insufficient documentation

## 2011-12-17 DIAGNOSIS — M25676 Stiffness of unspecified foot, not elsewhere classified: Secondary | ICD-10-CM | POA: Insufficient documentation

## 2011-12-17 DIAGNOSIS — M25673 Stiffness of unspecified ankle, not elsewhere classified: Secondary | ICD-10-CM | POA: Insufficient documentation

## 2011-12-17 NOTE — Progress Notes (Signed)
Physical Therapy Re-evaluation/Treatment  Patient Details  Name: [name] MRN: [id number] Date of Birth: 09/10/1966  Today's Date: 12/17/2011 Time: 0086-7619 Time Calculation (min): 62 min  Visit#: 4  of 12   Re-eval: 01/14/12 Assessment Diagnosis: Left ankle posterior tibial tendonitits Next MD Visit: 12/22/11 Charge: therex 23 min Manual 25 min MMT 1 unit estim 10 min  Subjective Symptoms/Limitations Symptoms: L ankle pain scale 2/10, pt unsure if making any progress. Pain Assessment Currently in Pain?: Yes Pain Score:   2 Pain Location: Foot Pain Orientation: Left  Objective:  Assessment LLE Strength Left Ankle Dorsiflexion: 4/5 Left Ankle Plantar Flexion: 3+/5 Left Ankle Inversion: 4/5 Left Ankle Eversion: 4/5 Palpation Palpation: tender to touch both posterior tibial tendon and anterior tibialis tendon on her left foot.    Exercise/Treatments Ankle Stretches Gastroc Stretch: 3 reps;30 seconds;Limitations Gastroc Stretch Limitations: 3 reps standing with slant board Other Stretch: prone quad passive st 3x 30", hs st 3x 30" Ankle Exercises - Standing BAPS: Sitting;Level 3;5 reps;Limitations BAPS Limitations: DF/PF; EV/Inv; CW/CCW 5 reps with knee controlled by therapist SLS: L SLS with boot on 5" max of 2 Ankle Exercises - Seated Other Seated Ankle Exercises: prone PF/ DF/ Inv/ Ev 10 reps with manual resistance  Modalities Modalities: Electrical Stimulation Manual Therapy Manual Therapy: Joint mobilization Edema Management: retro massage following hi volt estim Joint Mobilization: calcaneal  joint mobs to increase DF/PF and subtalor joint mobs for inversion/eversion Electrical Stimulation Electrical Stimulation Location: L ankle elevated Electrical Stimulation Action: edema control Electrical Stimulation Parameters: hi volt 45 volts  Electrical Stimulation Goals: Edema  Physical Therapy Assessment and Plan PT Assessment and Plan Clinical  Impression Statement: Reassessment complete for [name], she has had 4 OPPT sessions and has met 0/3 STGs and 0/4 LTGs.  Pain and edema continue with pain scales between 2-7/10 with increased pain with WB, dorsiflexion strength has improved with by 1/2 muscle grade with same grade for other motions, pt unable to tolerate L LE WB greater than 5" due to the increase pain with WB.  Manual calcaneal and subtalor joint mobs complete to increased ankle ROM, pt tolerated well.  Changed modalities at end of session for edema control, able to reduce swelling and pain with hi volt estim and retro massage. PT Plan: Recommend continuing PT for 4 more weeks to address the edema control, pain reduction, strengthening, ROM and to return to previous functional abilities.  Assess pain next session with changed modalities.  Instruct plantar fascia stretch next session if out of boot.    Goals Home Exercise Program Pt will Perform Home Exercise Program: Independently PT Short Term Goals Time to Complete Short Term Goals: 4 weeks PT Short Term Goal 1: The patient will report less than 5/10 pain at the end of her shift at work to show improved pain levels and increased QOL PT Short Term Goal 1 - Progress: Not met PT Short Term Goal 2: The patient will increase her left ankle strength by 1/2 muscle grade to increase ankle stability and strength and start to prepare her for time outside of the boot in 2 weeks.   PT Short Term Goal 2 - Progress: Progressing toward goal PT Short Term Goal 3: The patient will be able to WB on left foot only with bil hand support and reports of less than 5/10 pain in left ankle for 20 seconds to show improved tolerance of WB and continued preparation for being out of the boot.   PT Short  Term Goal 3 - Progress: Not met PT Long Term Goals Time to Complete Long Term Goals: 8 weeks PT Long Term Goal 1: The patient will report less than a 3/10 pain at the end of her 12 hour shift not wearing  the boot to show improved tolerance of work activities.   PT Long Term Goal 1 - Progress: Not met PT Long Term Goal 2: The patient will increase left ankle strength to 5/5 throughout to show improved ankle stability and strength so that she can safely go up and down one flight of stairs.   PT Long Term Goal 2 - Progress: Not met Long Term Goal 3: The patient will be able to go up and down one flight of stairs with no reports of pain reciprocally out of the boot with a handrail to show improved strength and pain  levels.   Long Term Goal 3 Progress: Not met Long Term Goal 4: The patient will be able to balance greater than or equal to 10 seconds on left foot without upper extremity support to show improved ability to both WB and balance on her left foot.   Long Term Goal 4 Progress: Not met  Problem List Patient Active Problem List  Diagnoses  . HYPERTENSION  . HIATAL HERNIA  . KNEE, ARTHRITIS, DEGEN./OSTEO  . PAIN IN JOINT, MULTIPLE SITES  . PALPITATIONS  . DYSPNEA  . Chest pain  . Posterior tibial tendonitis  . PTTD (posterior tibial tendon dysfunction)  . Knee pain, right    PT - End of Session Activity Tolerance: Patient tolerated treatment well General Behavior During Session: [facility] for tasks performed Cognition: [facility] for tasks performed   Aldona Lento 12/17/2011, 12:14 PM

## 2011-12-17 NOTE — Progress Notes (Deleted)
Physical Therapy Evaluation  Patient Details  Name: STARLYN DROGE MRN: [id number] Date of Birth: 01/27/1966  Today's Date: 12/17/2011 Time: 1700-1749 Time Calculation (min): 62 min  Visit#: 4  of 12   Re-eval: 01/14/12  Charge: therex 27 min Manual 25 min High volt 10 min MMT 1 unit  Subjective Symptoms/Limitations Symptoms: L ankle pain scale 2/10, pt unsure if making any progress. Pain Assessment Currently in Pain?: Yes Pain Score:   2 Pain Location: Foot Pain Orientation: Left  Objective:   Exercise/Treatments Ankle Stretches Gastroc Stretch: 3 reps;30 seconds;Limitations Gastroc Stretch Limitations: 3 reps standing with slant board Other Stretch: prone quad passive st 3x 30", hs st 3x 30" Ankle Exercises - Standing BAPS: Sitting;Level 3;5 reps;Limitations BAPS Limitations: DF/PF; EV/Inv; CW/CCW 5 reps with knee controlled by therapist SLS: L SLS with boot on 5" max of 2 Ankle Exercises - Seated Other Seated Ankle Exercises: prone PF/ DF/ Inv/ Ev 10 reps with manual resistance    Modalities Modalities: Electrical Stimulation Manual Therapy Manual Therapy: Joint mobilization Edema Management: retro massage following hi volt estim Joint Mobilization: calcaneal  joint mobs to increase DF/PF and subtalor joint mobs for inversion/eversion Electrical Stimulation Electrical Stimulation Location: L ankle elevated Electrical Stimulation Action: edema control Electrical Stimulation Parameters: hi volt 45 volts  Electrical Stimulation Goals: Edema  Physical Therapy Assessment and Plan PT Assessment and Plan Clinical Impression Statement: Reassessment complete for Mrs. Rinella, she has had 4 OPPT sessions and has met 0/3 STGs and 0/4 LTGs.  Pain and edema continue with pain scales between 2-7/10 with increased pain with WB, dorsiflexion strength has improved with by 1/2 muscle grade with same grade for other motions, pt unable to tolerate L LE WB greater than 5" due to  the increase pain with WB.  Manual calcaneal and subtalor joint mobs complete to increased ankle ROM, pt tolerated well.  Changed modalities at end of session for edema control, able to reduce swelling and pain with hi volt estim and retro massage. PT Plan: Recommend continuing PT for 4 more weeks to address the edema control, pain reduction, strengthening, ROM and to return to previous functional abilities.  Assess pain next session with changed modalities.  Instruct plantar fascia stretch next session if out of boot.    Goals Home Exercise Program Pt will Perform Home Exercise Program: Independently PT Short Term Goals Time to Complete Short Term Goals: 4 weeks PT Short Term Goal 1: The patient will report less than 5/10 pain at the end of her shift at work to show improved pain levels and increased QOL PT Short Term Goal 1 - Progress: Not met PT Short Term Goal 2: The patient will increase her left ankle strength by 1/2 muscle grade to increase ankle stability and strength and start to prepare her for time outside of the boot in 2 weeks.   PT Short Term Goal 2 - Progress: Progressing toward goal PT Short Term Goal 3: The patient will be able to WB on left foot only with bil hand support and reports of less than 5/10 pain in left ankle for 20 seconds to show improved tolerance of WB and continued preparation for being out of the boot.   PT Short Term Goal 3 - Progress: Not met PT Long Term Goals Time to Complete Long Term Goals: 8 weeks PT Long Term Goal 1: The patient will report less than a 3/10 pain at the end of her 12 hour shift not wearing the boot to  show improved tolerance of work activities.   PT Long Term Goal 1 - Progress: Not met PT Long Term Goal 2: The patient will increase left ankle strength to 5/5 throughout to show improved ankle stability and strength so that she can safely go up and down one flight of stairs.   PT Long Term Goal 2 - Progress: Not met Long Term Goal 3: The  patient will be able to go up and down one flight of stairs with no reports of pain reciprocally out of the boot with a handrail to show improved strength and pain  levels.   Long Term Goal 3 Progress: Not met Long Term Goal 4: The patient will be able to balance greater than or equal to 10 seconds on left foot without upper extremity support to show improved ability to both WB and balance on her left foot.   Long Term Goal 4 Progress: Not met  Problem List Patient Active Problem List  Diagnoses  . HYPERTENSION  . HIATAL HERNIA  . KNEE, ARTHRITIS, DEGEN./OSTEO  . PAIN IN JOINT, MULTIPLE SITES  . PALPITATIONS  . DYSPNEA  . Chest pain  . Posterior tibial tendonitis  . PTTD (posterior tibial tendon dysfunction)  . Knee pain, right    PT - End of Session Activity Tolerance: Patient tolerated treatment well General Behavior During Session: [facility] for tasks performed Cognition: [facility] for tasks performed  Aldona Lento, PTA 12/17/2011, 12:05 PM

## 2011-12-22 ENCOUNTER — Ambulatory Visit (INDEPENDENT_AMBULATORY_CARE_PROVIDER_SITE_OTHER): Payer: 59 | Admitting: Orthopedic Surgery

## 2011-12-22 ENCOUNTER — Ambulatory Visit (HOSPITAL_COMMUNITY)
Admission: RE | Admit: 2011-12-22 | Discharge: 2011-12-22 | Disposition: A | Discharge status: Home / Self Care | Payer: 59 | Source: Ambulatory Visit | Attending: Family Medicine | Admitting: Family Medicine

## 2011-12-22 ENCOUNTER — Encounter: Payer: Self-pay | Admitting: Orthopedic Surgery

## 2011-12-22 VITALS — BP 128/84 | Ht 71.5 in | Wt 251.0 lb

## 2011-12-22 DIAGNOSIS — M76829 Posterior tibial tendinitis, unspecified leg: Secondary | ICD-10-CM

## 2011-12-22 DIAGNOSIS — M6789 Other specified disorders of synovium and tendon, multiple sites: Secondary | ICD-10-CM

## 2011-12-22 NOTE — Patient Instructions (Signed)
Continue therapy and boot   Mri foot will be arranged  You have been scheduled for an MRI scan.     We will schedule you for another  appointment to review the results and make further treatment recommendations

## 2011-12-22 NOTE — Progress Notes (Signed)
Physical Therapy Treatment Patient Details  Name: [name] MRN: [id number] Date of Birth: 1966-01-01  Today's Date: 12/22/2011 Time: 0312-8118 Time Calculation (min): 70 min Visit#: 5  of 12   Re-eval: 01/14/12 Charges: therex 18', manual 24', estim unattended 10'    Subjective: Symptoms/Limitations Symptoms: Pt. states she was hurting bad yesterday, took alot of ibuprofen and ended up having to leave work early due to pain.  Went to MD today who is sending her for MRI to rule out possible tear.  If so, she will be referred to Dr. Beola Cord. Pain Assessment Currently in Pain?: Yes Pain Score:   2 Pain Location: Ankle Pain Orientation: Left   Exercise/Treatments Ankle Stretches Gastroc Stretch: 3 reps;30 seconds Gastroc Stretch Limitations: slant board Other Stretch: prone quad passive st 3x 30", hs st 3x 30" Ankle Exercises - Standing BAPS: Sitting;Level 3;10 reps BAPS Limitations: DF/PF; EV/Inv; CW/CCW 10 reps with knee controlled by therapist Ankle Exercises - Seated Other Seated Ankle Exercises: prone PF/ DF/ Inv/ Ev 10 reps with manual resistance    Modalities Modalities: Electrical Stimulation Manual Therapy Manual Therapy: Joint mobilization Edema Management: Retro massage following Hi Volt estim Joint Mobilization: calcaneal mobes to increase DF/PF, subtalar joint mobes to increase inv/ev Electrical Stimulation Electrical Stimulation Location: L ankle Electrical Stimulation Action: Hi volt, 0.5 ramp, continuous Electrical Stimulation Parameters: 50 Volts Electrical Stimulation Goals: Edema  Physical Therapy Assessment and Plan PT Assessment and Plan Clinical Impression Statement: No new exercises added today, gentle joint mobes to L ankle; Await results from MRI regarding tear.  Pt. continues to wear CAM boot and difficulty fully weight bearing on L LE due to pain. PT Plan: Continue per POC working on edema reduction.     Problem List Patient Active  Problem List  Diagnoses  . HYPERTENSION  . HIATAL HERNIA  . KNEE, ARTHRITIS, DEGEN./OSTEO  . PAIN IN JOINT, MULTIPLE SITES  . PALPITATIONS  . DYSPNEA  . Chest pain  . Posterior tibial tendonitis  . PTTD (posterior tibial tendon dysfunction)  . Knee pain, right    PT - End of Session Activity Tolerance: Patient tolerated treatment well General Behavior During Session: [facility] for tasks performed Cognition: [facility] for tasks performed   Ayyan Sites B. Mare Ferrari, PTA 12/22/2011, 11:10 AM

## 2011-12-22 NOTE — Progress Notes (Signed)
Patient ID: [name], female   DOB: 08/13/1966, 46 y.o.   MRN: [id number] Chief Complaint  Patient presents with  . Follow-up    4 week recheck on right knee and left foot.    #1 RIGHT knee status post injection, mobility and swelling have improved still complains of medial pain  #2 LEFT knee recent onset of pain locking catching and inability to extend the knee  #3 LEFT foot posterior tibial tendon dysfunction treated with physical therapy and Cam Walker.  No improvement although ibuprofen did help.  Review of systems as noted in the history Send LEFT knee pain  BP 128/84  Ht 5' 11.5" (1.816 m)  Wt 251 lb (113.853 kg)  BMI 34.52 kg/m2  LEFT knee crepitance painful patellar compression passive range of motion normal  LEFT foot tenderness swelling posterior tibial tendon with lateral impingement pain and tenderness as well  Neurovascular exam intact LEFT lower extremity and RIGHT lower extremity  RIGHT knee range of motion full mild medial joint line tenderness no meniscal signs  Otherwise awake alert and oriented x3 mood and affect normal  Impression bilateral patellofemoral pain syndrome with chondromalacia chronic Impression #2 ulcer tibial tendon dysfunction not improved.  Recommend MRI to rule out tear  Followup in 2 weeks if MRI results available

## 2011-12-25 ENCOUNTER — Other Ambulatory Visit: Payer: Self-pay | Admitting: *Deleted

## 2011-12-25 DIAGNOSIS — M76829 Posterior tibial tendinitis, unspecified leg: Secondary | ICD-10-CM

## 2011-12-29 ENCOUNTER — Ambulatory Visit (HOSPITAL_COMMUNITY): Payer: 59 | Admitting: Physical Therapy

## 2011-12-29 ENCOUNTER — Telehealth: Payer: Self-pay | Admitting: Orthopedic Surgery

## 2011-12-29 NOTE — Telephone Encounter (Signed)
[name] left a message saying that the injection did not help her right knee.  Said that you mentioned her getting an MRI if the injection did not help.  Asking if you will order the right knee MRI so she can get it when she gets the MRI of her foot.  She is working night shift, said to leave a message at [phone]

## 2011-12-30 ENCOUNTER — Other Ambulatory Visit (HOSPITAL_COMMUNITY): Payer: 59

## 2011-12-30 NOTE — Telephone Encounter (Signed)
Renee, did you get this?

## 2011-12-30 NOTE — Telephone Encounter (Signed)
Hey Renee whats the chance of getting mri right knee ?   Just asking?

## 2011-12-31 ENCOUNTER — Other Ambulatory Visit: Payer: Self-pay | Admitting: *Deleted

## 2011-12-31 ENCOUNTER — Other Ambulatory Visit (HOSPITAL_COMMUNITY): Payer: 59

## 2011-12-31 ENCOUNTER — Ambulatory Visit (HOSPITAL_COMMUNITY)
Admission: RE | Admit: 2011-12-31 | Discharge: 2011-12-31 | Disposition: A | Discharge status: Home / Self Care | Payer: 59 | Source: Ambulatory Visit | Attending: Family Medicine | Admitting: Family Medicine

## 2011-12-31 DIAGNOSIS — M25569 Pain in unspecified knee: Secondary | ICD-10-CM

## 2011-12-31 NOTE — Progress Notes (Signed)
Physical Therapy Treatment Patient Details  Name: [name] MRN: [id number] Date of Birth: Apr 22, 1966  Today's Date: 12/31/2011 Time: 5681-2751 Time Calculation (min): 57 min Visit#: 6  of 12   Re-eval: 01/14/12  Charge: therex 23 min Manual 15 min estim 10 min  Subjective: Symptoms/Limitations Symptoms: Ankle really stiff this morning, just off of work pain scale 3-4/10 at entrance. Pain Assessment Currently in Pain?: Yes Pain Location: Ankle Pain Orientation: Left  Objective:   Exercise/Treatments Ankle Stretches Gastroc Stretch: 3 reps;30 seconds Gastroc Stretch Limitations: slant board Other Stretch: prone quad passive st 3x 30", hs st 3x 30" AAnkle Exercises - Standing BAPS: Sitting;Level 3;10 reps BAPS Limitations: DF/PF; EV/Inv; CW/CCW 10 reps with knee controlled by therapist Ankle Exercises - Seated Other Seated Ankle Exercises: prone PF/ DF/ Inv/ Ev 10 reps with manual resistance    Manual Therapy Manual Therapy: Joint mobilization Edema Management: Retro massage (15 min) with hi volt estim (10 min) following  Joint Mobilization: calcaneal mobes to increase DF/PF, subtalar joint mobes to increase inv/ev Electrical Stimulation Electrical Stimulation Location: L ankle Electrical Stimulation Action: hi volt .5 ramp continuous Electrical Stimulation Parameters: 55 volts Electrical Stimulation Goals: Edema  Physical Therapy Assessment and Plan PT Assessment and Plan Clinical Impression Statement: No new exercises added today.  Still awaiting results from MRI regarding tear.  Pt. continues to wear CAM boot and difficulty fully weight bearing on L LE due to pain.  Increased ankle mobility/flexibility and decreased pain noted following joint mobs.  Noted decreased edema/pain on lateral malleolus at end of session following retro massage and hi-volt estim.  Pt stated pain scale 2-3/10. PT Plan: Continue per POC working on edema reduction.    Goals    Problem  List Patient Active Problem List  Diagnoses  . HYPERTENSION  . HIATAL HERNIA  . KNEE, ARTHRITIS, DEGEN./OSTEO  . PAIN IN JOINT, MULTIPLE SITES  . PALPITATIONS  . DYSPNEA  . Chest pain  . Posterior tibial tendonitis  . PTTD (posterior tibial tendon dysfunction)  . Knee pain, right    PT - End of Session Activity Tolerance: Patient tolerated treatment well General Behavior During Session: [facility] for tasks performed Cognition: [facility] for tasks performed  GP No functional reporting required  Aldona Lento, PTA 12/31/2011, 10:58 AM

## 2012-01-01 ENCOUNTER — Ambulatory Visit (HOSPITAL_COMMUNITY): Payer: 59

## 2012-01-05 ENCOUNTER — Ambulatory Visit (HOSPITAL_COMMUNITY): Payer: 59

## 2012-01-07 ENCOUNTER — Ambulatory Visit (HOSPITAL_COMMUNITY)
Admission: RE | Admit: 2012-01-07 | Discharge: 2012-01-07 | Disposition: A | Discharge status: Home / Self Care | Payer: 59 | Source: Ambulatory Visit | Attending: Orthopedic Surgery | Admitting: Orthopedic Surgery

## 2012-01-07 DIAGNOSIS — M2140 Flat foot [pes planus] (acquired), unspecified foot: Secondary | ICD-10-CM | POA: Insufficient documentation

## 2012-01-07 DIAGNOSIS — I10 Essential (primary) hypertension: Secondary | ICD-10-CM | POA: Insufficient documentation

## 2012-01-07 DIAGNOSIS — M659 Unspecified synovitis and tenosynovitis, unspecified site: Secondary | ICD-10-CM | POA: Insufficient documentation

## 2012-01-07 DIAGNOSIS — M6740 Ganglion, unspecified site: Secondary | ICD-10-CM | POA: Insufficient documentation

## 2012-01-07 DIAGNOSIS — IMO0002 Reserved for concepts with insufficient information to code with codable children: Secondary | ICD-10-CM | POA: Insufficient documentation

## 2012-01-07 DIAGNOSIS — M6281 Muscle weakness (generalized): Secondary | ICD-10-CM | POA: Insufficient documentation

## 2012-01-07 DIAGNOSIS — M25569 Pain in unspecified knee: Secondary | ICD-10-CM

## 2012-01-07 DIAGNOSIS — M25673 Stiffness of unspecified ankle, not elsewhere classified: Secondary | ICD-10-CM | POA: Insufficient documentation

## 2012-01-07 DIAGNOSIS — X58XXXA Exposure to other specified factors, initial encounter: Secondary | ICD-10-CM | POA: Insufficient documentation

## 2012-01-07 DIAGNOSIS — M25676 Stiffness of unspecified foot, not elsewhere classified: Secondary | ICD-10-CM | POA: Insufficient documentation

## 2012-01-07 DIAGNOSIS — M899 Disorder of bone, unspecified: Secondary | ICD-10-CM | POA: Insufficient documentation

## 2012-01-07 DIAGNOSIS — M79609 Pain in unspecified limb: Secondary | ICD-10-CM | POA: Insufficient documentation

## 2012-01-07 DIAGNOSIS — M25469 Effusion, unspecified knee: Secondary | ICD-10-CM | POA: Insufficient documentation

## 2012-01-07 DIAGNOSIS — M159 Polyosteoarthritis, unspecified: Secondary | ICD-10-CM | POA: Insufficient documentation

## 2012-01-07 DIAGNOSIS — IMO0001 Reserved for inherently not codable concepts without codable children: Secondary | ICD-10-CM | POA: Insufficient documentation

## 2012-01-07 DIAGNOSIS — M25579 Pain in unspecified ankle and joints of unspecified foot: Secondary | ICD-10-CM | POA: Insufficient documentation

## 2012-01-07 DIAGNOSIS — M76829 Posterior tibial tendinitis, unspecified leg: Secondary | ICD-10-CM

## 2012-01-07 NOTE — Progress Notes (Signed)
Physical Therapy Treatment Patient Details  Name: [name] MRN: [id number] Date of Birth: August 24, 1966  Today's Date: 01/07/2012 Time: 1440-1530 Time Calculation (min): 50 min Visit#: 7  of 12   Re-eval: 01/14/12  Charge: therex 30 min Manual 10 min Hi volt 10 min  Subjective: Symptoms/Limitations Symptoms: Feeling better today been off feet all day did have a headache this morning so took IBprofen so leg feeling  better, pain scale 2/10.  MRI scheduled for later today one contrast and one without. Pain Assessment Currently in Pain?: Yes Pain Score:   2 Pain Location: Ankle Pain Orientation: Left  Objective:  Exercise/Treatments Ankle Stretches Gastroc Stretch: 3 reps;30 seconds Gastroc Stretch Limitations: slant board Other Stretch: prone passive quad st 3x 30" Other Stretch: supine active h/s st 3x 30" Ankle Exercises - Standing BAPS: Standing;Level 2;5 reps;Limitations BAPS Limitations: in // bars with UE supportDF/PF 5 reps; EV/Inv 5 reps; CW/CCW 2 reps with hips controlled by therapist to reduce compensation Ankle Exercises - Seated Other Seated Ankle Exercises: seated PF/ DF/ Inv/ Ev 15 reps with blue tband    Modalities Modalities: Electrical Stimulation Manual Therapy Manual Therapy: Joint mobilization Joint Mobilization: calcaneal mobes to increase DF/PF, subtalar joint mobes to increase inv/ev  Electrical Stimulation Electrical Stimulation Location: L ankle Electrical Stimulation Action: reduce edema Electrical Stimulation Parameters: hi volt .5 ramp continuous L 44 power intensity Electrical Stimulation Goals: Edema  Physical Therapy Assessment and Plan PT Assessment and Plan Clinical Impression Statement: Progressed BAPS board to standing with UE support to improve ROM and ankle strategy.  Edema still a limited factor increasing pain, able to improve ankle flexibility and reduce pain with joint mobs and edema with hi volt at end of session.  Pt  scheduled for MRI later today, awaiting results.   PT Plan: Assess findings with MRI, continue progressing strength and edema reduction.    Goals    Problem List Patient Active Problem List  Diagnoses  . HYPERTENSION  . HIATAL HERNIA  . KNEE, ARTHRITIS, DEGEN./OSTEO  . PAIN IN JOINT, MULTIPLE SITES  . PALPITATIONS  . DYSPNEA  . Chest pain  . Posterior tibial tendonitis  . PTTD (posterior tibial tendon dysfunction)  . Knee pain, right    PT - End of Session Activity Tolerance: Patient tolerated treatment well General Behavior During Session: [facility] for tasks performed Cognition: [facility] for tasks performed  GP No functional reporting required  Aldona Lento, PTA 01/07/2012, 4:40 PM

## 2012-01-08 ENCOUNTER — Telehealth: Payer: Self-pay | Admitting: Orthopedic Surgery

## 2012-01-08 NOTE — Telephone Encounter (Signed)
Patient called today and scheduled follow up appointment, had both MRI's at [facility] (ankle and knee).  Scheduled the appointment for 01/18/12, 3:15p.m.

## 2012-01-12 ENCOUNTER — Ambulatory Visit (HOSPITAL_COMMUNITY)
Admission: RE | Admit: 2012-01-12 | Discharge: 2012-01-12 | Disposition: A | Discharge status: Home / Self Care | Payer: 59 | Source: Ambulatory Visit | Attending: Family Medicine | Admitting: Family Medicine

## 2012-01-12 NOTE — Evaluation (Signed)
Physical Therapy Re-Evaluation  Patient Details  Name: [name] MRN: [id number] Date of Birth: 1966/05/14  Today's Date: 01/12/2012 Time: 9798-9211 Time Calculation (min): 45 min Charges: 1 MMT, 40' Manual Visit#: 8  of 12   Re-eval: 02/11/12    Past Medical History:  Past Medical History  Diagnosis Date  . Chest pain     cath 2005 norm cors, repeat cath Feb 2011 normal cors and only minimally  elevated pulmonary pressures  . Dyspnea   . Palpitations   . Obesity   . Hiatal hernia    Past Surgical History:  Past Surgical History  Procedure Date  . Cesarean section   . Cholecystectomy   . Abdominal hysterectomy   . Shoulder surgery     right    Subjective Symptoms/Limitations Symptoms: I am still very sore.  I will see Dr. Aline [name] on Monday to discuss MRI findings.  Reports her pain continues to get up to a 7/10  Objective: Reviewed MRI findings through EPIC.  Pt with continued significant swelling to her medial malleolous.   Pain Assessment Currently in Pain?: Yes Pain Score:   2  Cognition/Observation Observation/Other Assessments Observations: significant edema to Left lateral malleloeus.  L foot toe out, trunk flexion with gait with boot on.   Assessment LLE Strength Left Ankle Dorsiflexion: 3+/5 Left Ankle Plantar Flexion: 3+/5 Left Ankle Inversion: 3+/5 Left Ankle Eversion: 3+/5 Palpation Palpation: tenderness with gentle touch to posteior tibial region, talonavicular region, ATFL.  Atrophy to gastroc region   Exercise/Treatments  Manual Therapy Manual Therapy: Other (comment) Edema Management: Retro massage to lateral malleolous Joint Mobilization: Grade II-III to calcenous, and talonavicular joint Other Manual Therapy: gentle PROM to decrease pain. Cryotherapy Number Minutes Cryotherapy: 5 Minutes Cryotherapy Location: Ankle Type of Cryotherapy: Ice massage Education: on use of compression stocking during work Physical Therapy Assessment  and Plan PT Assessment and Plan Clinical Impression Statement: [name] has attended OP PT for 3 months and over this time has not made many gains with her strength or decreased pain with use of modalities, manual techniques and exercises.  She will continue  complete her HEP, however require further f/u from MD about further treatment options.  Pt continues to have a great amount of pain and reports that she is unable to be weight bearing beause of her job requirements .  However I feel she is at risk of secondary injuries because of extend use of the CAM boot which is leading her to develop poor gait mechanics.  Awaiting recommendations per MD about next treatment options.  PT Plan: F/U w/MD orders to determine Frequency and Duration.    Goals    Problem List Patient Active Problem List  Diagnoses  . HYPERTENSION  . HIATAL HERNIA  . KNEE, ARTHRITIS, DEGEN./OSTEO  . PAIN IN JOINT, MULTIPLE SITES  . PALPITATIONS  . DYSPNEA  . Chest pain  . Posterior tibial tendonitis  . PTTD (posterior tibial tendon dysfunction)  . Knee pain, right       [name] 01/12/2012, 3:33 PM  Physician Documentation Your signature is required to indicate approval of the treatment plan as stated above.  Please sign and either send electronically or make a copy of this report for your files and return this physician signed original.   Please mark one 1.__approve of plan  2. ___approve of plan with the following conditions.   ______________________________  _____________________ Physician Signature                                                                                                             Date

## 2012-01-18 ENCOUNTER — Ambulatory Visit (INDEPENDENT_AMBULATORY_CARE_PROVIDER_SITE_OTHER): Payer: 59 | Admitting: Orthopedic Surgery

## 2012-01-18 ENCOUNTER — Encounter: Payer: Self-pay | Admitting: Orthopedic Surgery

## 2012-01-18 VITALS — BP 112/74 | Ht 71.5 in | Wt 251.0 lb

## 2012-01-18 DIAGNOSIS — M76829 Posterior tibial tendinitis, unspecified leg: Secondary | ICD-10-CM

## 2012-01-18 DIAGNOSIS — M23329 Other meniscus derangements, posterior horn of medial meniscus, unspecified knee: Secondary | ICD-10-CM

## 2012-01-18 NOTE — Progress Notes (Signed)
Patient ID: [name], female   DOB: 09-30-1966, 46 y.o.   MRN: [id number] Chief Complaint  Patient presents with  . Results    MRI result review, left ankle and right knee    The patient had an MRI of her RIGHT knee she has a torn medial meniscus as she has osteoarthritis  On reexamination she has a positive McMurray sign with the medial joint line tenderness and continued pain and symptoms clinically.  Recommend knee arthroscopy and partial medial meniscectomy  As far as her foot does the MRI of her foot and ankle reveal that she has pes planus deformity with associated degenerative disc disease at the talonavicular and first metatarsal tarsal joint just posterior impingement with synovitis with a ganglion cyst and edema in the posterior tubercle of the tibialis as well as the peroneal tubercle of the calcaneus with peroneal tenosynovitis she is sinus tarsi syndrome as well and this is noted clinically  The patient's husband has just had surgery on his rotator cuff and is undergoing physical therapy to get back to normal and she cannot do surgery at this time although she strongly advised to have surgical treatment on the RIGHT knee with arthroscopy and to followup with the foot and ankle specialist regarding her foot problems.  In the meantime we will continue with the short Cam Walker.

## 2012-01-18 NOTE — Patient Instructions (Signed)
Continue brace for the foot  Wait on surgery for the knee, call us when ready

## 2012-02-01 ENCOUNTER — Telehealth: Payer: Self-pay | Admitting: Orthopedic Surgery

## 2012-02-01 NOTE — Telephone Encounter (Signed)
Patient called to schedule her right knee surgery, possibly for latter part of June.  States would like to have it sometime after June 8th.  Patient needs to have her surgery date turned in to work by May 3rd, if possible.  Please call her at ph# [phone].  Also, patient is inquiring about status of her referral to the orthopedic foot and ankle specialist in [place].  States has not heard back from anyone on the referral appointment.

## 2012-02-09 ENCOUNTER — Encounter: Payer: Self-pay | Admitting: Orthopedic Surgery

## 2012-02-09 ENCOUNTER — Telehealth: Payer: Self-pay | Admitting: Orthopedic Surgery

## 2012-02-09 ENCOUNTER — Other Ambulatory Visit: Payer: Self-pay | Admitting: *Deleted

## 2012-02-09 DIAGNOSIS — M79672 Pain in left foot: Secondary | ICD-10-CM

## 2012-02-09 NOTE — Telephone Encounter (Signed)
Patient aware.

## 2012-02-09 NOTE — Telephone Encounter (Signed)
Patient called back after receiving surgery date of 03/18/12 and pre-op and post-op appointment information.  States after speaking with husband, who is still out of work from his shoulder surgery, is thinking of possibly re-scheduling hers to a later date, depending on what Dr. Aline Brochure has available.  Her ph# is [phone] (Mobile)

## 2012-02-09 NOTE — Telephone Encounter (Signed)
Patient's surgery is scheduled for June 12 at 7:30 and pre op June 7 11am

## 2012-02-15 NOTE — Telephone Encounter (Signed)
Surgery has been cancelled Patient will advise when to reschedule

## 2012-03-11 ENCOUNTER — Other Ambulatory Visit (HOSPITAL_COMMUNITY): Payer: 59

## 2012-03-15 ENCOUNTER — Telehealth: Payer: Self-pay | Admitting: Orthopedic Surgery

## 2012-03-15 NOTE — Telephone Encounter (Signed)
Patient requests call from Dr. Aline Brochure regarding surgery, which needs to be re-scheduled for knee; states has questions related to knee, foot.  Please call her cell# [phone].

## 2012-03-18 ENCOUNTER — Ambulatory Visit: Admit: 2012-03-18 | Payer: 59 | Admitting: Orthopedic Surgery

## 2012-03-18 SURGERY — ARTHROSCOPY, KNEE, WITH MEDIAL MENISCECTOMY
Anesthesia: Choice | Laterality: Right

## 2012-03-21 ENCOUNTER — Ambulatory Visit: Payer: 59 | Admitting: Orthopedic Surgery

## 2012-05-11 ENCOUNTER — Telehealth: Payer: Self-pay | Admitting: Orthopedic Surgery

## 2012-05-11 NOTE — Telephone Encounter (Signed)
Patient called to request a note for work, to permit her to get a parking place close to the building, due to her difficulty walking on the knee and foot she has treated for.  She would also like to get a temporary handicapped sticker if Dr Aline Brochure would approve as well.   Her cell # is [phone], or her mobile at work, [phone].

## 2012-05-12 NOTE — Telephone Encounter (Signed)
Called patient to relay; left voice mail message at her cell#.  N/A at the mobile work #; advised patient to return call to discuss the note and that we will be happy to issue accordingly.

## 2012-05-12 NOTE — Telephone Encounter (Signed)
APPROVED

## 2012-05-18 ENCOUNTER — Encounter: Payer: Self-pay | Admitting: Orthopedic Surgery

## 2012-05-18 NOTE — Telephone Encounter (Signed)
Spoke with patient, note done as per request; patient aware will be ready for pickup today.

## 2012-05-19 ENCOUNTER — Telehealth: Payer: Self-pay | Admitting: *Deleted

## 2012-05-19 NOTE — Telephone Encounter (Signed)
Yes but lets do this Monday together

## 2012-05-24 ENCOUNTER — Other Ambulatory Visit: Payer: Self-pay | Admitting: *Deleted

## 2012-05-25 NOTE — Telephone Encounter (Signed)
Surgery scheduled

## 2012-05-26 ENCOUNTER — Encounter (HOSPITAL_BASED_OUTPATIENT_CLINIC_OR_DEPARTMENT_OTHER): Payer: Self-pay | Admitting: *Deleted

## 2012-05-27 ENCOUNTER — Encounter (HOSPITAL_BASED_OUTPATIENT_CLINIC_OR_DEPARTMENT_OTHER): Payer: Self-pay | Admitting: *Deleted

## 2012-05-27 NOTE — Progress Notes (Signed)
No labs needed

## 2012-05-28 ENCOUNTER — Emergency Department (HOSPITAL_COMMUNITY)
Admission: EM | Admit: 2012-05-28 | Discharge: 2012-05-28 | Disposition: A | Discharge status: Home / Self Care | Payer: PRIVATE HEALTH INSURANCE | Attending: Emergency Medicine | Admitting: Emergency Medicine

## 2012-05-28 ENCOUNTER — Encounter (HOSPITAL_COMMUNITY): Payer: Self-pay | Admitting: *Deleted

## 2012-05-28 DIAGNOSIS — G4730 Sleep apnea, unspecified: Secondary | ICD-10-CM | POA: Insufficient documentation

## 2012-05-28 DIAGNOSIS — Z9089 Acquired absence of other organs: Secondary | ICD-10-CM | POA: Insufficient documentation

## 2012-05-28 DIAGNOSIS — K219 Gastro-esophageal reflux disease without esophagitis: Secondary | ICD-10-CM | POA: Insufficient documentation

## 2012-05-28 DIAGNOSIS — J45901 Unspecified asthma with (acute) exacerbation: Secondary | ICD-10-CM | POA: Insufficient documentation

## 2012-05-28 DIAGNOSIS — K5090 Crohn's disease, unspecified, without complications: Secondary | ICD-10-CM | POA: Insufficient documentation

## 2012-05-28 DIAGNOSIS — E669 Obesity, unspecified: Secondary | ICD-10-CM | POA: Insufficient documentation

## 2012-05-28 MED ORDER — IPRATROPIUM BROMIDE 0.02 % IN SOLN
RESPIRATORY_TRACT | Status: AC
  Filled 2012-05-28: qty 2.5

## 2012-05-28 MED ORDER — IPRATROPIUM BROMIDE 0.02 % IN SOLN
0.5000 mg | Freq: Once | RESPIRATORY_TRACT | Status: AC
  Administered 2012-05-28: 0.5 mg via RESPIRATORY_TRACT

## 2012-05-28 MED ORDER — ALBUTEROL SULFATE (5 MG/ML) 0.5% IN NEBU
2.5000 mg | INHALATION_SOLUTION | Freq: Once | RESPIRATORY_TRACT | Status: AC
  Administered 2012-05-28: 2.5 mg via RESPIRATORY_TRACT

## 2012-05-28 MED ORDER — DIPHENHYDRAMINE HCL 25 MG PO CAPS
50.0000 mg | ORAL_CAPSULE | Freq: Once | ORAL | Status: AC
  Administered 2012-05-28: 50 mg via ORAL
  Filled 2012-05-28: qty 2

## 2012-05-28 MED ORDER — ALBUTEROL SULFATE (5 MG/ML) 0.5% IN NEBU
INHALATION_SOLUTION | RESPIRATORY_TRACT | Status: AC
  Filled 2012-05-28: qty 1

## 2012-05-28 MED ORDER — PREDNISONE 10 MG PO TABS: 20.0000 mg | ORAL_TABLET | Freq: Every day | ORAL | Status: DC

## 2012-05-28 MED ORDER — ALBUTEROL SULFATE (5 MG/ML) 0.5% IN NEBU
INHALATION_SOLUTION | RESPIRATORY_TRACT | Status: AC
  Filled 2012-05-28: qty 0.5

## 2012-05-28 MED ORDER — ALBUTEROL SULFATE (5 MG/ML) 0.5% IN NEBU
5.0000 mg | INHALATION_SOLUTION | Freq: Once | RESPIRATORY_TRACT | Status: AC
  Administered 2012-05-28: 5 mg via RESPIRATORY_TRACT

## 2012-05-28 MED ORDER — PREDNISONE 20 MG PO TABS
60.0000 mg | ORAL_TABLET | Freq: Once | ORAL | Status: AC
  Administered 2012-05-28: 60 mg via ORAL
  Filled 2012-05-28: qty 2

## 2012-05-28 NOTE — ED Notes (Signed)
Pt exposed to lemongrass aromatherapy. Pt allergic to grasses. Pt sob

## 2012-05-28 NOTE — ED Provider Notes (Signed)
History     CSN: [id number]  Arrival date & time 05/28/12  1900   First MD Initiated Contact with Patient 05/28/12 1911      Chief Complaint  Patient presents with  . Allergic Reaction  . Pruritis  . Chest Pain    (Consider location/radiation/quality/duration/timing/severity/associated sxs/prior treatment) HPI Patient comes in tonight with wheezing and shortness of breath. She has a known history of asthma. She is exposed to some RM therapy. That made her feel like she is having an allergic reaction with skin itching and wheezing. She received an albuterol neb prior to coming in here but continued to have coughing and wheezing. Past Medical History  Diagnosis Date  . Chest pain     cath 2005 norm cors, repeat cath Feb 2011 normal cors and only minimally  elevated pulmonary pressures  . Dyspnea   . Palpitations   . Obesity   . Hiatal hernia   . Sleep apnea sleep study 11/03/2009    mild-no cpap  . GERD (gastroesophageal reflux disease)   . Crohn disease     Past Surgical History  Procedure Date  . Cesarean section   . Abdominal hysterectomy   . Shoulder surgery     right  . Cholecystectomy 09/08/2006    lap. chole.  . Inguinal hernia repair 10/30/2008    right  . Cardiac catheterization 11/22/2009 and 2005    Family History  Problem Relation Age of Onset  . Hypertension Mother   . Lupus Father   . COPD Father     History  Substance Use Topics  . Smoking status: Never Smoker   . Smokeless tobacco: Not on file  . Alcohol Use: No    OB History    Grav Para Term Preterm Abortions TAB SAB Ect Mult Living                  Review of Systems  All other systems reviewed and are negative.    Allergies  Hydromorphone hcl; Omnipaque; Other; Pollen extract; and Wasp venom  Home Medications   Current Outpatient Rx  Name Route Sig Dispense Refill  . ACETAMINOPHEN 500 MG PO TABS Oral Take 500 mg by mouth as needed. For pain    . ALBUTEROL SULFATE (2.5 MG/3ML)  0.083% IN NEBU Nebulization Take 2.5 mg by nebulization every 6 (six) hours as needed. For shortness of breath    . BUDESONIDE ER 3 MG PO CP24 Oral Take 3 mg by mouth every morning. Take 69m for 2 weeks then take 682mdaily thereafter    . CETIRIZINE HCL 10 MG PO TABS Oral Take 10 mg by mouth daily as needed. For allergie    . VITAMIN D-3 PO Oral Take 1 capsule by mouth daily.      . B-12 PO Oral Take 1 tablet by mouth daily.      . CYCLOBENZAPRINE HCL 10 MG PO TABS Oral Take 10 mg by mouth daily as needed. For muscle spasms     . ESOMEPRAZOLE MAGNESIUM 40 MG PO CPDR Oral Take 40 mg by mouth daily before breakfast.      . HYOSCYAMINE SULFATE 0.125 MG PO TBDP Sublingual Place 0.125 mg under the tongue 4 (four) times daily as needed. For GI      BP 151/94  Pulse 86  Temp 98.3 F (36.8 C) (Oral)  Resp 24  Ht 6' (1.829 m)  Wt 250 lb (113.399 kg)  BMI 33.91 kg/m2  SpO2 98%  Physical Exam  Nursing note and vitals reviewed. Constitutional: She appears well-developed and well-nourished.  HENT:  Head: Normocephalic and atraumatic.  Eyes: Conjunctivae and EOM are normal. Pupils are equal, round, and reactive to light.  Neck: Normal range of motion. Neck supple.  Cardiovascular: Normal rate, regular rhythm, normal heart sounds and intact distal pulses.   Pulmonary/Chest: She has wheezes.  Abdominal: Soft. Bowel sounds are normal.  Musculoskeletal: Normal range of motion.  Neurological: She is alert.  Skin: Skin is warm and dry.  Psychiatric: She has a normal mood and affect. Thought content normal.    ED Course  Procedures (including critical care time)  Labs Reviewed - No data to display No results found.   No diagnosis found.    MDM  Patient given neb x 2 and prednisone 60 mg with benadryl po.  Her wheezing is resolved.  Plan continued prednisone qday for 5 days.         Shaune Pollack, MD 05/28/12 2043

## 2012-06-01 ENCOUNTER — Encounter (HOSPITAL_BASED_OUTPATIENT_CLINIC_OR_DEPARTMENT_OTHER): Payer: Self-pay | Admitting: Anesthesiology

## 2012-06-01 ENCOUNTER — Encounter (HOSPITAL_BASED_OUTPATIENT_CLINIC_OR_DEPARTMENT_OTHER): Payer: Self-pay | Admitting: *Deleted

## 2012-06-01 ENCOUNTER — Encounter (HOSPITAL_BASED_OUTPATIENT_CLINIC_OR_DEPARTMENT_OTHER)
Admission: RE | Disposition: A | Discharge status: Home / Self Care | Payer: Self-pay | Source: Ambulatory Visit | Attending: Orthopedic Surgery

## 2012-06-01 ENCOUNTER — Ambulatory Visit (HOSPITAL_BASED_OUTPATIENT_CLINIC_OR_DEPARTMENT_OTHER)
Admission: RE | Admit: 2012-06-01 | Discharge: 2012-06-01 | Disposition: A | Discharge status: Home / Self Care | Payer: 59 | Source: Ambulatory Visit | Attending: Orthopedic Surgery | Admitting: Orthopedic Surgery

## 2012-06-01 ENCOUNTER — Ambulatory Visit (HOSPITAL_BASED_OUTPATIENT_CLINIC_OR_DEPARTMENT_OTHER): Payer: 59 | Admitting: Anesthesiology

## 2012-06-01 DIAGNOSIS — Z8249 Family history of ischemic heart disease and other diseases of the circulatory system: Secondary | ICD-10-CM | POA: Insufficient documentation

## 2012-06-01 DIAGNOSIS — G4730 Sleep apnea, unspecified: Secondary | ICD-10-CM | POA: Insufficient documentation

## 2012-06-01 DIAGNOSIS — M25879 Other specified joint disorders, unspecified ankle and foot: Secondary | ICD-10-CM | POA: Insufficient documentation

## 2012-06-01 DIAGNOSIS — M659 Unspecified synovitis and tenosynovitis, unspecified site: Secondary | ICD-10-CM | POA: Insufficient documentation

## 2012-06-01 DIAGNOSIS — M25579 Pain in unspecified ankle and joints of unspecified foot: Secondary | ICD-10-CM

## 2012-06-01 DIAGNOSIS — M629 Disorder of muscle, unspecified: Secondary | ICD-10-CM | POA: Insufficient documentation

## 2012-06-01 DIAGNOSIS — Z6833 Body mass index (BMI) 33.0-33.9, adult: Secondary | ICD-10-CM | POA: Insufficient documentation

## 2012-06-01 DIAGNOSIS — Z90710 Acquired absence of both cervix and uterus: Secondary | ICD-10-CM | POA: Insufficient documentation

## 2012-06-01 DIAGNOSIS — E669 Obesity, unspecified: Secondary | ICD-10-CM | POA: Insufficient documentation

## 2012-06-01 DIAGNOSIS — M2420 Disorder of ligament, unspecified site: Secondary | ICD-10-CM | POA: Insufficient documentation

## 2012-06-01 DIAGNOSIS — Z836 Family history of other diseases of the respiratory system: Secondary | ICD-10-CM | POA: Insufficient documentation

## 2012-06-01 DIAGNOSIS — K219 Gastro-esophageal reflux disease without esophagitis: Secondary | ICD-10-CM | POA: Insufficient documentation

## 2012-06-01 HISTORY — DX: Crohn's disease, unspecified, without complications: K50.90

## 2012-06-01 HISTORY — DX: Sleep apnea, unspecified: G47.30

## 2012-06-01 HISTORY — PX: ANKLE ARTHROSCOPY: SHX545

## 2012-06-01 HISTORY — DX: Gastro-esophageal reflux disease without esophagitis: K21.9

## 2012-06-01 LAB — POCT HEMOGLOBIN-HEMACUE: Hemoglobin: 12.6 g/dL (ref 12.0–15.0)

## 2012-06-01 SURGERY — ARTHROSCOPY, ANKLE
Anesthesia: General | Site: Ankle | Laterality: Left | Wound class: Clean

## 2012-06-01 MED ORDER — CHLORHEXIDINE GLUCONATE 4 % EX LIQD: 60.0000 mL | Freq: Once | CUTANEOUS | Status: DC

## 2012-06-01 MED ORDER — ROPIVACAINE HCL 5 MG/ML IJ SOLN
INTRAMUSCULAR | Status: DC | PRN
  Administered 2012-06-01: 25 mL via EPIDURAL

## 2012-06-01 MED ORDER — CEFAZOLIN SODIUM-DEXTROSE 2-3 GM-% IV SOLR: 2.0000 g | INTRAVENOUS | Status: DC

## 2012-06-01 MED ORDER — HYDROMORPHONE HCL PF 1 MG/ML IJ SOLN: 0.2500 mg | INTRAMUSCULAR | Status: DC | PRN

## 2012-06-01 MED ORDER — OXYCODONE-ACETAMINOPHEN 5-325 MG PO TABS: 1.0000 | ORAL_TABLET | ORAL | Status: AC | PRN

## 2012-06-01 MED ORDER — LIDOCAINE HCL (CARDIAC) 20 MG/ML IV SOLN
INTRAVENOUS | Status: DC | PRN
  Administered 2012-06-01: 50 mg via INTRAVENOUS

## 2012-06-01 MED ORDER — OXYCODONE HCL 5 MG/5ML PO SOLN: 5.0000 mg | Freq: Once | ORAL | Status: DC | PRN

## 2012-06-01 MED ORDER — ONDANSETRON HCL 4 MG/2ML IJ SOLN
INTRAMUSCULAR | Status: DC | PRN
  Administered 2012-06-01: 4 mg via INTRAVENOUS

## 2012-06-01 MED ORDER — DEXAMETHASONE SODIUM PHOSPHATE 4 MG/ML IJ SOLN
INTRAMUSCULAR | Status: DC | PRN
  Administered 2012-06-01: 10 mg via INTRAVENOUS

## 2012-06-01 MED ORDER — LIDOCAINE HCL 1 % IJ SOLN
INTRAMUSCULAR | Status: DC | PRN
  Administered 2012-06-01: 2 mL via INTRADERMAL

## 2012-06-01 MED ORDER — MIDAZOLAM HCL 2 MG/2ML IJ SOLN
1.0000 mg | INTRAMUSCULAR | Status: DC | PRN
  Administered 2012-06-01: 2 mg via INTRAVENOUS

## 2012-06-01 MED ORDER — LACTATED RINGERS IV SOLN
INTRAVENOUS | Status: DC
  Administered 2012-06-01 (×2): via INTRAVENOUS

## 2012-06-01 MED ORDER — METOCLOPRAMIDE HCL 5 MG/ML IJ SOLN: 10.0000 mg | Freq: Once | INTRAMUSCULAR | Status: DC | PRN

## 2012-06-01 MED ORDER — FENTANYL CITRATE 0.05 MG/ML IJ SOLN
50.0000 ug | INTRAMUSCULAR | Status: DC | PRN
  Administered 2012-06-01: 100 ug via INTRAVENOUS

## 2012-06-01 MED ORDER — SODIUM CHLORIDE 0.9 % IV SOLN: INTRAVENOUS | Status: DC

## 2012-06-01 MED ORDER — MIDAZOLAM HCL 5 MG/5ML IJ SOLN
INTRAMUSCULAR | Status: DC | PRN
  Administered 2012-06-01: 1 mg via INTRAVENOUS

## 2012-06-01 MED ORDER — SODIUM CHLORIDE 0.9 % IR SOLN
Status: DC | PRN
  Administered 2012-06-01: 3000 mL

## 2012-06-01 MED ORDER — OXYCODONE HCL 5 MG PO TABS: 5.0000 mg | ORAL_TABLET | Freq: Once | ORAL | Status: DC | PRN

## 2012-06-01 MED ORDER — VITAMIN C 500 MG PO TABS: 500.0000 mg | ORAL_TABLET | Freq: Every day | ORAL | Status: AC

## 2012-06-01 MED ORDER — ASPIRIN EC 325 MG PO TBEC: 325.0000 mg | DELAYED_RELEASE_TABLET | Freq: Two times a day (BID) | ORAL | Status: AC

## 2012-06-01 MED ORDER — PROPOFOL 10 MG/ML IV BOLUS
INTRAVENOUS | Status: DC | PRN
  Administered 2012-06-01: 300 mg via INTRAVENOUS

## 2012-06-01 MED ORDER — LIDOCAINE-EPINEPHRINE (PF) 1.5 %-1:200000 IJ SOLN
INTRAMUSCULAR | Status: DC | PRN
  Administered 2012-06-01: 25 mL

## 2012-06-01 SURGICAL SUPPLY — 59 items
APL SKNCLS STERI-STRIP NONHPOA (GAUZE/BANDAGES/DRESSINGS) ×1
BANDAGE ELASTIC 4 VELCRO ST LF (GAUZE/BANDAGES/DRESSINGS) ×1 IMPLANT
BANDAGE ELASTIC 6 VELCRO ST LF (GAUZE/BANDAGES/DRESSINGS) IMPLANT
BANDAGE GAUZE ELAST BULKY 4 IN (GAUZE/BANDAGES/DRESSINGS) IMPLANT
BENZOIN TINCTURE PRP APPL 2/3 (GAUZE/BANDAGES/DRESSINGS) ×1 IMPLANT
BLADE CUDA 2.0 (BLADE) ×1 IMPLANT
BLADE CUDA SHAVER 3.5 (BLADE) IMPLANT
BLADE SURG 15 STRL LF DISP TIS (BLADE) IMPLANT
BLADE SURG 15 STRL SS (BLADE) ×2
BRUSH SCRUB EZ PLAIN DRY (MISCELLANEOUS) ×2 IMPLANT
BUR 3.5 LG SPHERICAL (BURR) IMPLANT
BUR CUDA 2.9 (BURR) IMPLANT
BUR GATOR 2.9 (BURR) IMPLANT
BUR OVAL 4.0 (BURR) IMPLANT
BUR SPHERICAL 2.9 (BURR) IMPLANT
BURR 3.5 LG SPHERICAL (BURR)
CANISTER OMNI JUG 16 LITER (MISCELLANEOUS) ×1 IMPLANT
CANISTER SUCTION 2500CC (MISCELLANEOUS) ×2 IMPLANT
CLOTH BEACON ORANGE TIMEOUT ST (SAFETY) ×2 IMPLANT
CUFF TOURNIQUET SINGLE 34IN LL (TOURNIQUET CUFF) ×1 IMPLANT
DRAPE ARTHROSCOPY W/POUCH 114 (DRAPES) ×2 IMPLANT
DRAPE OEC MINIVIEW 54X84 (DRAPES) IMPLANT
DRAPE SURG 17X23 STRL (DRAPES) ×2 IMPLANT
DURA STEPPER LG (CAST SUPPLIES) IMPLANT
DURA STEPPER MED (CAST SUPPLIES) IMPLANT
DURA STEPPER SML (CAST SUPPLIES) IMPLANT
ELECT REM PT RETURN 9FT ADLT (ELECTROSURGICAL)
ELECTRODE REM PT RTRN 9FT ADLT (ELECTROSURGICAL) ×1 IMPLANT
GAUZE XEROFORM 1X8 LF (GAUZE/BANDAGES/DRESSINGS) ×2 IMPLANT
GLOVE BIO SURGEON STRL SZ8 (GLOVE) ×2 IMPLANT
GLOVE BIOGEL PI IND STRL 8 (GLOVE) ×2 IMPLANT
GLOVE BIOGEL PI INDICATOR 8 (GLOVE) ×2
GLOVE SURG SS PI 8.0 STRL IVOR (GLOVE) ×2 IMPLANT
GOWN BRE IMP PREV XXLGXLNG (GOWN DISPOSABLE) ×2 IMPLANT
GOWN PREVENTION PLUS XLARGE (GOWN DISPOSABLE) ×3 IMPLANT
NS IRRIG 1000ML POUR BTL (IV SOLUTION) IMPLANT
PACK ARTHROSCOPY DSU (CUSTOM PROCEDURE TRAY) ×2 IMPLANT
PACK BASIN DAY SURGERY FS (CUSTOM PROCEDURE TRAY) ×2 IMPLANT
PADDING CAST ABS 4INX4YD NS (CAST SUPPLIES) ×1
PADDING CAST ABS COTTON 4X4 ST (CAST SUPPLIES) ×1 IMPLANT
PENCIL BUTTON HOLSTER BLD 10FT (ELECTRODE) IMPLANT
SPONGE GAUZE 4X4 12PLY (GAUZE/BANDAGES/DRESSINGS) ×2 IMPLANT
STOCKINETTE 6  STRL (DRAPES) ×1
STOCKINETTE 6 STRL (DRAPES) ×1 IMPLANT
STRAP ANKLE FOOT DISTRACTOR (ORTHOPEDIC SUPPLIES) IMPLANT
SUCTION FRAZIER TIP 10 FR DISP (SUCTIONS) IMPLANT
SUT ETHILON 4 0 PS 2 18 (SUTURE) ×2 IMPLANT
SUT MNCRL AB 4-0 PS2 18 (SUTURE) ×1 IMPLANT
SUT VIC AB 3-0 PS1 18 (SUTURE) ×2
SUT VIC AB 3-0 PS1 18XBRD (SUTURE) IMPLANT
SYR BULB 3OZ (MISCELLANEOUS) IMPLANT
TAPE STRIPS DRAPE STRL (GAUZE/BANDAGES/DRESSINGS) ×1 IMPLANT
TOWEL OR 17X24 6PK STRL BLUE (TOWEL DISPOSABLE) ×2 IMPLANT
TOWEL OR NON WOVEN STRL DISP B (DISPOSABLE) ×2 IMPLANT
TUBE CONNECTING 20X1/4 (TUBING) ×4 IMPLANT
TUBING ARTHROSCOPY IRRIG 16FT (MISCELLANEOUS) ×2 IMPLANT
UNDERPAD 30X30 INCONTINENT (UNDERPADS AND DIAPERS) ×2 IMPLANT
WAND SHORT BEVEL W/CORD (SURGICAL WAND) ×2 IMPLANT
WATER STERILE IRR 1000ML POUR (IV SOLUTION) ×2 IMPLANT

## 2012-06-01 NOTE — H&P (Signed)
  H&P documentation: Placed to be scanned history and physical exam in chart.  -History and Physical Reviewed  -Patient has been re-examined  -No change in the plan of care  [name] A

## 2012-06-01 NOTE — Anesthesia Procedure Notes (Addendum)
Anesthesia Regional Block:  Popliteal block  Pre-Anesthetic Checklist: ,, timeout performed, Correct Patient, Correct Site, Correct Laterality, Correct Procedure, Correct Position, site marked, Risks and benefits discussed,  Surgical consent,  Pre-op evaluation,  At surgeon's request and post-op pain management  Laterality: Left  Prep: chloraprep       Needles:   Needle Type: Other   (Arrow Echogenic)   Needle Length: 9cm  Needle Gauge: 21    Additional Needles:  Procedures: ultrasound guided Popliteal block Narrative:  Start time: 06/01/2012 1:21 PM End time: 06/01/2012 1:27 PM Injection made incrementally with aspirations every 5 mL.  Performed by: Personally  Anesthesiologist: Nada Libman, MD  Additional Notes: Ultrasound guidance used to: id relevant anatomy, confirm needle position, local anesthetic spread, avoidance of vascular puncture. Picture saved. No complications. Block performed personally by Jessy Oto. Albertina Parr, MD  .    Popliteal block Procedure Name: LMA Insertion Date/Time: 06/01/2012 2:20 PM Performed by: Lieutenant Diego Pre-anesthesia Checklist: Patient identified, Emergency Drugs available, Suction available and Patient being monitored Patient Re-evaluated:Patient Re-evaluated prior to inductionOxygen Delivery Method: Circle System Utilized Preoxygenation: Pre-oxygenation with 100% oxygen Intubation Type: IV induction Ventilation: Mask ventilation without difficulty LMA: LMA with gastric port inserted LMA Size: 4.0 Number of attempts: 1 Placement Confirmation: positive ETCO2 and breath sounds checked- equal and bilateral Tube secured with: Tape Dental Injury: Teeth and Oropharynx as per pre-operative assessment

## 2012-06-01 NOTE — H&P (Signed)
[name] is an 46 y.o. female.   Chief Complaint: Left ankle pain HPI: 46 year old female with left ankle pain for for 8 months without any known injury. Despite conservative treatment and physical therapy she has on going left ankle pain. Pain 10/10 at worst and average 7-8/10 pain. Left ankle pain is located over the anterior aspect of the ankle.   Past Medical History  Diagnosis Date  . Chest pain     cath 2005 norm cors, repeat cath Feb 2011 normal cors and only minimally  elevated pulmonary pressures  . Dyspnea   . Palpitations   . Obesity   . Hiatal hernia   . Sleep apnea sleep study 11/03/2009    mild-no cpap  . GERD (gastroesophageal reflux disease)   . Crohn disease     Past Surgical History  Procedure Date  . Cesarean section   . Abdominal hysterectomy   . Shoulder surgery     right  . Cholecystectomy 09/08/2006    lap. chole.  . Inguinal hernia repair 10/30/2008    right  . Cardiac catheterization 11/22/2009 and 2005    Family History  Problem Relation Age of Onset  . Hypertension Mother   . Lupus Father   . COPD Father    Social History:  reports that she has never smoked. She does not have any smokeless tobacco history on file. She reports that she does not drink alcohol or use illicit drugs.  Allergies:  Allergies  Allergen Reactions  . Hydromorphone Hcl   . Omnipaque (Iohexol) Hives and Nausea Only    Pt. Was premedicated with emergent protocol.  . Other     grass  . Pollen Extract   . Wasp Venom     No prescriptions prior to admission    No results found for this or any previous visit (from the past 68 hour(s)). No results found.  Review of Systems  Constitutional: Negative for fever, chills and malaise/fatigue.  Eyes: Negative for blurred vision, double vision, photophobia and pain.  Respiratory:       COPD Recent respiratory attack secondary to exposure to lemon grass on prednisone symptoms much improved.  Cardiovascular: Negative.         HTN  Gastrointestinal: Positive for heartburn.       Crohn's     Musculoskeletal:       Left anterior lateral ankle pain  Achilles pain left leg  Skin: Negative for itching and rash.  Neurological: Negative.  Negative for headaches.  Endo/Heme/Allergies: Negative.        Lupus    Height 5' 11.5" (1.816 m), weight 113.399 kg (250 lb). Physical Exam  Constitutional: She is oriented to person, place, and time. She appears well-developed and well-nourished.  HENT:  Head: Normocephalic.  Eyes: EOM are normal.  Cardiovascular: Normal rate and intact distal pulses.   Respiratory: Effort normal.  Musculoskeletal:       Tenderness over left ankle anterior lateral aspect Tenderness left distal achilles Tenderness over left posterior tibial tendon  Neurological: She is alert and oriented to person, place, and time.  Skin: Skin is warm and dry.  Psychiatric: She has a normal mood and affect.     Assessment/Plan Left anterior ankle impingement  Left posterior tibial tendinitis  Left sinus tarsi impingement  Patient wishes to proceed with surgery the surgery she will require is left ankle arthroscopy with extensive debridement and gastroc slide. Risk reviewed with patient by Dr. Beola Cord  Please see  03/07/12 office note.  Erskine Emery 06/01/2012, 8:36 AM

## 2012-06-01 NOTE — Transfer of Care (Signed)
Immediate Anesthesia Transfer of Care Note  Patient: [name]  Procedure(s) Performed: Procedure(s) (LRB): ANKLE ARTHROSCOPY (Left)  Patient Location: PACU  Anesthesia Type: GA combined with regional for post-op pain  Level of Consciousness: awake and alert   Airway & Oxygen Therapy: Patient Spontanous Breathing and Patient connected to face mask oxygen  Post-op Assessment: Report given to PACU RN and Post -op Vital signs reviewed and stable  Post vital signs: Reviewed and stable  Complications: No apparent anesthesia complications

## 2012-06-01 NOTE — Progress Notes (Signed)
Assisted Dr. Albertina Parr with left, ultrasound guided, popliteal/saphenous block. Side rails up, monitors on throughout procedure. See vital signs in flow sheet. Tolerated Procedure well.

## 2012-06-01 NOTE — Brief Op Note (Signed)
06/01/2012  3:20 PM  PATIENT:  [name]  46 y.o. female  PRE-OPERATIVE DIAGNOSIS:  left anterior ankle impengement and tight gastroc  POST-OPERATIVE DIAGNOSIS:  left anterior ankle impengement and tight gastroc  PROCEDURE:  Procedure(s) (LRB): ANKLE ARTHROSCOPY (Left)  SURGEON:  Surgeon(s) and Role:    * Colin Rhein, MD - Primary  PHYSICIAN ASSISTANT: Benita Stabile, PAC   ASSISTANTS: Benita Stabile, [facility]    ANESTHESIA:   general  EBL:  Total I/O In: 1000 [I.V.:1000] Out: -   BLOOD ADMINISTERED:none  DRAINS: none   LOCAL MEDICATIONS USED:  NONE  SPECIMEN:  No Specimen  DISPOSITION OF SPECIMEN:  N/A  COUNTS:  YES  TOURNIQUET:  * No tourniquets in log *  DICTATION: .Other Dictation: Dictation Number [id number]  PLAN OF CARE: Discharge to home after PACU  PATIENT DISPOSITION:  PACU - hemodynamically stable.   Delay start of Pharmacological VTE agent (>24hrs) due to surgical blood loss or risk of bleeding: no

## 2012-06-01 NOTE — Anesthesia Postprocedure Evaluation (Signed)
Anesthesia Post Note  Patient: [name]  Procedure(s) Performed: Procedure(s) (LRB): ANKLE ARTHROSCOPY (Left)  Anesthesia type: General  Patient location: PACU  Post pain: Pain level controlled  Post assessment: Patient's Cardiovascular Status Stable  Last Vitals:  Filed Vitals:   06/01/12 1600  BP:   Pulse: 81  Temp:   Resp: 14    Post vital signs: Reviewed and stable  Level of consciousness: alert  Complications: No apparent anesthesia complications

## 2012-06-01 NOTE — Anesthesia Preprocedure Evaluation (Signed)
Anesthesia Evaluation  Patient identified by MRN, date of birth, ID band Patient awake    Reviewed: Allergy & Precautions, H&P , NPO status , Patient's Chart, lab work & pertinent test results, reviewed documented beta blocker date and time   Airway Mallampati: II TM Distance: >3 FB Neck ROM: full    Dental   Pulmonary shortness of breath and with exertion, asthma , sleep apnea ,  breath sounds clear to auscultation        Cardiovascular hypertension, On Medications negative cardio ROS  Rhythm:regular     Neuro/Psych  Neuromuscular disease negative psych ROS   GI/Hepatic Neg liver ROS, hiatal hernia, GERD-  Medicated and Controlled,  Endo/Other  negative endocrine ROS  Renal/GU negative Renal ROS  negative genitourinary   Musculoskeletal   Abdominal   Peds  Hematology negative hematology ROS (+)   Anesthesia Other Findings See surgeon's H&P   Reproductive/Obstetrics negative OB ROS                           Anesthesia Physical Anesthesia Plan  ASA: III  Anesthesia Plan: General   Post-op Pain Management:    Induction: Intravenous  Airway Management Planned: LMA  Additional Equipment:   Intra-op Plan:   Post-operative Plan: Extubation in OR  Informed Consent: I have reviewed the patients History and Physical, chart, labs and discussed the procedure including the risks, benefits and alternatives for the proposed anesthesia with the patient or authorized representative who has indicated his/her understanding and acceptance.   Dental Advisory Given  Plan Discussed with: CRNA and Surgeon  Anesthesia Plan Comments:         Anesthesia Quick Evaluation

## 2012-06-02 ENCOUNTER — Encounter (HOSPITAL_BASED_OUTPATIENT_CLINIC_OR_DEPARTMENT_OTHER): Payer: Self-pay | Admitting: Orthopedic Surgery

## 2012-06-02 NOTE — Op Note (Deleted)
NAMEAMELIANA, BRASHEAR              ACCOUNT NO.:  [id number]  MEDICAL RECORD NO.:  [id number]  LOCATION:  PERIO                         FACILITY:  [facility]  PHYSICIAN:  Weber Cooks, M.D.     DATE OF BIRTH:  02/03/1966  DATE OF PROCEDURE:  06/01/2012 DATE OF DISCHARGE:                              OPERATIVE REPORT   PREOPERATIVE DIAGNOSES: 1. Left anterior ankle impingement. 2. Left tight gastroc.  POSTOPERATIVE DIAGNOSES: 1. Left anterior ankle impingement. 2. Left tight gastroc.  OPERATION: 1. Left ankle arthroscopy with extensive debridement. 2. Left gastroc slide.  ANESTHESIA:  General.  SURGEON:  Weber Cooks, MD  ASSISTANT:  Erskine Emery, PA-C  ESTIMATED BLOOD LOSS:  Minimal.  TOURNIQUET:  None.  COMPLICATIONS:  None.  DISPOSITION:  Stable to PR.  INDICATION:  This is a 46 year old female who has had longstanding anterior and posteromedial ankle pain that was interfering with her life to the point where she cannot do what she wants to do despite conservative management.  She was consented for the above procedure. All risks, infection, nerve or vessel injury, persistent pain, worsening pain, prolonged recovery, wound healing problems, stiffness, arthritis, DVT, PE, and the fact that still would have to treat posterior tibial tendonitis postop.  Conservatively with therapy and orthotics likely were all explained.  Questions were encouraged and answered.  OPERATION:  The patient was brought to the operating room and placed in the supine position after adequate general anesthesia was administered as well as Ancef 1 g IV piggyback.  A bump was placed on her left ipsilateral hip, internally rotating left lower extremity.  All bony prominences were well padded.  The left lower extremity was then prepped and draped in a sterile manner.  We started the procedure with a longitudinal incision over the medial aspect of the gastrocnemius and musculotendinous junction.   Dissection was carried down through the skin.  Hemostasis was obtained.  Fascia was opened in line with the incision.  Conjoined region was then developed between the gastroc soleus.  Soft tissue was elevated off the posterior aspect of the gastrocnemius.  Sural nerve was identified and protected posteriorly throughout the case.  Gastrocnemius was then released with curved Mayo scissors.  This had an extra release of tight gastroc.  The area was copiously irrigated with normal saline.  Subcu was closed with 3-0 Vicryl, skin was closed with 4-0 nylon.  Subcuticular Steri-Strips were applied.  We then mapped out the anatomical landmarks including anterior tibialis tendon and peroneus tertius tendon.  Superficial peroneal nerve could not be seen.  A spinal needle was just placed medial to the anterior tibialis tendon.  A 20 mL of normal saline instilled into the ankle.  Merrilee Seashore and spread technique was then utilized to create the anteromedial portal medial to the anterior tibialis tendon.  Blunt tip trocar with cannula followed by camera was then placed in the ankle and under direct visualization, anterolateral portal was created with spinal needle followed by nick and spread technique avoiding the peroneus tertius tendon and staying lateral to this.  There was a large amount of synovitis over the entire anterior aspect of the ankle concentrating anterolaterally.  There was a robust  accessory tib-fib ligament and with dorsiflexing the ankle, this was impinging this corner of the ankle extended into the lateral gutter as well.  This was all extensively debrided with a shaver as well as a radiofrequency bevel.  The ranging ankle has no impingement in this area.  All the synovitis anteriorly was also debrided as well.  There was no obvious osteochondral lesions. Pictures were obtained throughout the procedure.  Camera was placed anterolaterally visualizing anteromedially.  Again, there was a  large amount of synovitis anteromedially and extending into the anteromedial gutter.  This was also debrided with a shaver as well as radiofrequency bevel.  Once this was done, pictures were obtained throughout the procedure.  Camera was removed.  Wounds were closed with 4-0 nylon stitch.  Sterile dressing was applied.  CAM walker boot was applied. The patient was stable to the PR.     Weber Cooks, M.D.     PB/MEDQ  D:  06/01/2012  T:  06/02/2012  Job:  [id number]

## 2012-06-02 NOTE — Op Note (Signed)
NAMEREMIE, MATHISON              ACCOUNT NO.:  [id number]  MEDICAL RECORD NO.:  [id number]  LOCATION:  PERIO                         FACILITY:  [facility]  PHYSICIAN:  Weber Cooks, M.D.     DATE OF BIRTH:  Feb 02, 1966  DATE OF PROCEDURE:  06/01/2012 DATE OF DISCHARGE:                              OPERATIVE REPORT   PREOPERATIVE DIAGNOSES: 1. Left anterior ankle impingement. 2. Left tight gastroc.  POSTOPERATIVE DIAGNOSES: 1. Left anterior ankle impingement. 2. Left tight gastroc.  OPERATION: 1. Left ankle arthroscopy with extensive debridement. 2. Left gastroc slide.  ANESTHESIA:  General.  SURGEON:  Weber Cooks, MD  ASSISTANT:  Erskine Emery, PA-C  ESTIMATED BLOOD LOSS:  Minimal.  TOURNIQUET:  None.  COMPLICATIONS:  None.  DISPOSITION:  Stable to PR.  INDICATION:  This is a 46 year old female who has had longstanding anterior and posteromedial ankle pain that was interfering with her life to the point where she cannot do what she wants to do despite conservative management.  She was consented for the above procedure. All risks, infection, nerve or vessel injury, persistent pain, worsening pain, prolonged recovery, wound healing problems, stiffness, arthritis, DVT, PE, and the fact that still would have to treat posterior tibial tendonitis postop.  Conservatively with therapy and orthotics likely were all explained.  Questions were encouraged and answered.  OPERATION:  The patient was brought to the operating room and placed in the supine position after adequate general anesthesia was administered as well as Ancef 1 g IV piggyback.  A bump was placed on her left ipsilateral hip, internally rotating left lower extremity.  All bony prominences were well padded.  The left lower extremity was then prepped and draped in a sterile manner.  We started the procedure with a longitudinal incision over the medial aspect of the gastrocnemius and musculotendinous junction.   Dissection was carried down through the skin.  Hemostasis was obtained.  Fascia was opened in line with the incision.  Conjoined region was then developed between the gastroc soleus.  Soft tissue was elevated off the posterior aspect of the gastrocnemius.  Sural nerve was identified and protected posteriorly throughout the case.  Gastrocnemius was then released with curved Mayo scissors.  This had an extra release of tight gastroc.  The area was copiously irrigated with normal saline.  Subcu was closed with 3-0 Vicryl, skin was closed with 4-0 nylon.  Subcuticular Steri-Strips were applied.  We then mapped out the anatomical landmarks including anterior tibialis tendon and peroneus tertius tendon.  Superficial peroneal nerve could not be seen.  A spinal needle was just placed medial to the anterior tibialis tendon.  A 20 mL of normal saline instilled into the ankle.  Merrilee Seashore and spread technique was then utilized to create the anteromedial portal medial to the anterior tibialis tendon.  Blunt tip trocar with cannula followed by camera was then placed in the ankle and under direct visualization, anterolateral portal was created with spinal needle followed by nick and spread technique avoiding the peroneus tertius tendon and staying lateral to this.  There was a large amount of synovitis over the entire anterior aspect of the ankle concentrating anterolaterally.  There was a robust  accessory tib-fib ligament and with dorsiflexing the ankle, this was impinging this corner of the ankle extended into the lateral gutter as well.  This was all extensively debrided with a shaver as well as a radiofrequency bevel.  The ranging ankle has no impingement in this area.  All the synovitis anteriorly was also debrided as well.  There was no obvious osteochondral lesions. Pictures were obtained throughout the procedure.  Camera was placed anterolaterally visualizing anteromedially.  Again, there was a  large amount of synovitis anteromedially and extending into the anteromedial gutter.  This was also debrided with a shaver as well as radiofrequency bevel.  Once this was done, pictures were obtained throughout the procedure.  Camera was removed.  Wounds were closed with 4-0 nylon stitch.  Sterile dressing was applied.  CAM walker boot was applied. The patient was stable to the PR.     Weber Cooks, M.D.     PB/MEDQ  D:  06/01/2012  T:  06/02/2012  Job:  [id number]

## 2012-06-03 ENCOUNTER — Encounter (HOSPITAL_COMMUNITY): Payer: Self-pay | Admitting: Pharmacy Technician

## 2012-06-07 ENCOUNTER — Encounter (HOSPITAL_BASED_OUTPATIENT_CLINIC_OR_DEPARTMENT_OTHER): Payer: Self-pay

## 2012-06-09 ENCOUNTER — Telehealth: Payer: Self-pay | Admitting: Orthopedic Surgery

## 2012-06-09 NOTE — Telephone Encounter (Addendum)
Message copied by Uvaldo Bristle on Thu Jun 09, 2012  4:16 PM ------      Message from: Carole Civil      Created: Tue May 31, 2012 11:29 AM      Regarding: RE: upcoming surgery scheduled, question about last visit here       No       ----- Message -----         From: Uvaldo Bristle         Sent: 05/31/2012   8:25 AM           To: Carole Civil, MD      Subject: upcoming surgery scheduled, question about l#            Dr. Aline Brochure,      Regarding Thelma Comp and surgery scheduled for 06/17/12,      Knee arthroscopy.   I relayed as discussed, that patient did not need to return prior to her scheduled Right knee surgery, per this note of 05/31/12.  She states that Dr. Aline Brochure saw her at [facility], and said he would see her in office prior to this procedure.  She also relayed that she had her Left ankle surgery on 06/01/12, by Dr. Beola Cord, at [facility].  Her home ph# is [phone].

## 2012-06-09 NOTE — Patient Instructions (Addendum)
Your procedure is scheduled on:  06/17/2012  Report to [facility] at    6:15  AM.  Call this number if you have problems the morning of surgery: [phone]   Remember:   Do not drink or eat food:After Midnight.    Clear liquids include soda, tea, black coffee, apple or grape juice, broth.  Take these medicines the morning of surgery with A SIP OF WATER: Albuterol, Nexium    Do not wear jewelry, make-up or nail polish.  Do not wear lotions, powders, or perfumes. You may wear deodorant.  Do not shave 48 hours prior to surgery.  Do not bring valuables to the hospital.  Contacts, dentures or bridgework may not be worn into surgery.  Leave suitcase in the car. After surgery it may be brought to your room.  For patients admitted to the hospital, checkout time is 11:00 AM the day of discharge.   Patients discharged the day of surgery will not be allowed to drive home.  Name and phone number of your driver:   Special Instructions: CHG Shower use special wash: 1/2 bottle night before surgery and 1/2 bottle morning of surgery.   Please read over the following fact sheets that you were given: Pain Booklet, MRSA  Surgical Site Infection Prevention, Anesthesia Post-op Instructions and Care and Recovery After Surgery

## 2012-06-10 ENCOUNTER — Encounter (HOSPITAL_COMMUNITY): Payer: Self-pay

## 2012-06-10 ENCOUNTER — Other Ambulatory Visit: Payer: Self-pay | Admitting: Orthopedic Surgery

## 2012-06-10 ENCOUNTER — Encounter (HOSPITAL_COMMUNITY)
Admission: RE | Admit: 2012-06-10 | Discharge: 2012-06-10 | Disposition: A | Discharge status: Home / Self Care | Payer: 59 | Source: Ambulatory Visit | Attending: Orthopedic Surgery | Admitting: Orthopedic Surgery

## 2012-06-10 ENCOUNTER — Encounter (HOSPITAL_COMMUNITY): Payer: Self-pay | Admitting: Pharmacy Technician

## 2012-06-10 ENCOUNTER — Encounter: Payer: Self-pay | Admitting: Orthopedic Surgery

## 2012-06-10 LAB — CBC
HCT: 40.4 % (ref 36.0–46.0)
MCHC: 33.2 g/dL (ref 30.0–36.0)
MCV: 84.2 fL (ref 78.0–100.0)
RDW: 14.5 % (ref 11.5–15.5)

## 2012-06-10 LAB — SURGICAL PCR SCREEN: Staphylococcus aureus: NEGATIVE

## 2012-06-10 LAB — BASIC METABOLIC PANEL
BUN: 9 mg/dL (ref 6–23)
Creatinine, Ser: 0.8 mg/dL (ref 0.50–1.10)
GFR calc Af Amer: >90 mL/min (ref >90)
GFR calc non Af Amer: 88 mL/min — ABNORMAL LOW (ref >90)

## 2012-06-10 NOTE — Patient Instructions (Signed)
20 [name]  06/10/2012   Your procedure is scheduled on:  Friday, 06/17/12  Report to [facility] at [place] AM.  Call this number if you have problems the morning of surgery: [phone]   Remember:   Do not eat food:After Midnight.  May have clear liquids:until Midnight .  Clear liquids include soda, tea, black coffee, apple or grape juice, broth.  Take these medicines the morning of surgery with A SIP OF WATER: nexium, albuterol and roxicet if needed. Please bring your inhaler with you.   Do not wear jewelry, make-up or nail polish.  Do not wear lotions, powders, or perfumes. You may wear deodorant.  Do not shave 48 hours prior to surgery. Men may shave face and neck.  Do not bring valuables to the hospital.  Contacts, dentures or bridgework may not be worn into surgery.  Leave suitcase in the car. After surgery it may be brought to your room.  For patients admitted to the hospital, checkout time is 11:00 AM the day of discharge.   Patients discharged the day of surgery will not be allowed to drive home.  Name and phone number of your driver: family  Special Instructions: CHG Shower Use Special Wash: 1/2 bottle night before surgery and 1/2 bottle morning of surgery.   Please read over the following fact sheets that you were given: Pain Booklet, MRSA Information, Surgical Site Infection Prevention, Anesthesia Post-op Instructions and Care and Recovery After Surgery   Arthroscopic Procedure, Knee An arthroscopic procedure can find what is wrong with your knee. PROCEDURE Arthroscopy is a surgical technique that allows your orthopedic surgeon to diagnose and treat your knee injury with accuracy. They will look into your knee through a small instrument. This is almost like a small (pencil sized) telescope. Because arthroscopy affects your knee less than open knee surgery, you can anticipate a more rapid recovery. Taking an active role by following your caregiver's instructions will help  with rapid and complete recovery. Use crutches, rest, elevation, ice, and knee exercises as instructed. The length of recovery depends on various factors including type of injury, age, physical condition, medical conditions, and your rehabilitation. Your knee is the joint between the large bones (femur and tibia) in your leg. Cartilage covers these bone ends which are smooth and slippery and allow your knee to bend and move smoothly. Two menisci, thick, semi-lunar shaped pads of cartilage which form a rim inside the joint, help absorb shock and stabilize your knee. Ligaments bind the bones together and support your knee joint. Muscles move the joint, help support your knee, and take stress off the joint itself. Because of this all programs and physical therapy to rehabilitate an injured or repaired knee require rebuilding and strengthening your muscles. AFTER THE PROCEDURE  After the procedure, you will be moved to a recovery area until most of the effects of the medication have worn off. Your caregiver will discuss the test results with you.   Only take over-the-counter or prescription medicines for pain, discomfort, or fever as directed by your caregiver.  SEEK MEDICAL CARE IF:   You have increased bleeding from your wounds.   You see redness, swelling, or have increasing pain in your wounds.   You have pus coming from your wound.   You have an oral temperature above 102 F (38.9 C).   You notice a bad smell coming from the wound or dressing.   You have severe pain with any motion of your knee.  SEEK IMMEDIATE MEDICAL CARE IF:   You develop a rash.   You have difficulty breathing.   You have any allergic problems.  Document Released: 09/18/2000 Document Revised: 09/10/2011 Document Reviewed: 04/11/2008 [facility] Patient Information 2012 [place].

## 2012-06-13 ENCOUNTER — Telehealth: Payer: Self-pay | Admitting: Orthopedic Surgery

## 2012-06-13 NOTE — Telephone Encounter (Signed)
[place], [place], ph# [phone], regarding out-patient surgery scheduled 06/17/12 at [facility]; [place], [phone]. Per Chaunna H, no pre-authorization is required.  Note: Ref# [id number].

## 2012-06-14 ENCOUNTER — Telehealth: Payer: Self-pay | Admitting: Radiology

## 2012-06-14 ENCOUNTER — Encounter (HOSPITAL_BASED_OUTPATIENT_CLINIC_OR_DEPARTMENT_OTHER): Payer: Self-pay

## 2012-06-14 NOTE — Telephone Encounter (Signed)
[name] called and said she had ankle surgery 2 weeks ago and they have had her on aspirin, 325 mg x 2 everyday and she took one yesterday. She wants to know is she going to be all right for surgery on Friday?

## 2012-06-14 NOTE — Telephone Encounter (Signed)
Yes continue the aspirin

## 2012-06-15 NOTE — Telephone Encounter (Signed)
Patient informed. 

## 2012-06-16 NOTE — OR Nursing (Signed)
Office notified that we need H&P for this 1st case in the morning.

## 2012-06-16 NOTE — H&P (Signed)
[name] is an 46 y.o. female.   Chief Complaint: Right knee pain HPI: [name] is a 46 y.o. female with a history of bilateral patellofemoral chondromalacia/arthritis she presents now with pain in her  RIGHT knee.   RIGHT knee she fell several weeks ago now complains of medial knee pain swelling pain radiates proximally as a pulling sensation over the medial aspect of the knee associated with burning. Date of injury November 1.  She did not respond well to nonoperative treatment and now presents for right knee arthroscopy.  We discussed the postoperative course possible complications continued nonoperative treatment she wishes to proceed with surgery   Past Medical History  Diagnosis Date  . Chest pain     cath 2005 norm cors, repeat cath Feb 2011 normal cors and only minimally  elevated pulmonary pressures  . Palpitations   . Obesity   . Hiatal hernia   . Sleep apnea sleep study 11/03/2009    mild-no cpap  . GERD (gastroesophageal reflux disease)   . Crohn disease     Past Surgical History  Procedure Date  . Cesarean section   . Abdominal hysterectomy   . Shoulder surgery     right x 2   . Cholecystectomy 09/08/2006    lap. chole.  . Inguinal hernia repair 10/30/2008    right  . Ankle arthroscopy 06/01/2012    Procedure: ANKLE ARTHROSCOPY;  Surgeon: Colin Rhein, MD;  Location: [place];  Service: Orthopedics;  Laterality: Left;  left ankle arthorsocpy with extensive debridement and gastroc slide  . Cardiac catheterization 11/22/2009 and 2005    WNL    Family History  Problem Relation Age of Onset  . Hypertension Mother   . Lupus Father   . COPD Father    Social History:  reports that she has never smoked. She does not have any smokeless tobacco history on file. She reports that she does not drink alcohol or use illicit drugs.  Allergies:  Allergies  Allergen Reactions  . Other Shortness Of Breath    Lemon grass  . Wasp Venom  Shortness Of Breath  . Hydromorphone Hcl Nausea And Vomiting  . Omnipaque (Iohexol) Hives and Nausea Only    Pt. Was premedicated with emergent protocol.  . Pollen Extract Swelling  . Adhesive (Tape) Rash    No prescriptions prior to admission    No results found for this or any previous visit (from the past 48 hour(s)). No results found.  ROS no current positive review of systems. Patient recently had ankle surgery   There were no vitals taken for this visit. Physical Exam  Constitutional: She is oriented to person, place, and time. She appears well-developed and well-nourished. No distress.  HENT:  Head: Normocephalic.  Eyes: Pupils are equal, round, and reactive to light.  Neck: No JVD present. No tracheal deviation present.  Cardiovascular: Intact distal pulses.   Respiratory: Effort normal. No stridor.  GI: Soft.  Lymphadenopathy:    She has cervical adenopathy.  Neurological: She is alert and oriented to person, place, and time. She has normal reflexes. She displays normal reflexes. No cranial nerve deficit. She exhibits normal muscle tone. Coordination normal.  Skin: Skin is warm and dry. No rash noted. She is not diaphoretic. No erythema. No pallor.  Psychiatric: She has a normal mood and affect. Her behavior is normal. Judgment and thought content normal.   Upper extremity exam  Inspection and palpation revealed no abnormalities in the  upper extremities.  Range of motion is full without contracture.  Motor exam is normal with grade 5 strength.  The joints are fully reduced without subluxation.  There is no atrophy or tremor and muscle tone is normal.  All joints are stable.  Left ankle previous arthroscopy. She's wearing a ankle boot she is using crutches  Right knee medial joint line tenderness mild effusion. Flexion ARC normal. Stability normal. Patellofemoral pain and tenderness. Strength normal. Skin normal.   Assessment/Plan Torn medial meniscus right  knee with some osteoarthritis  Arthroscopy right knee partial medial meniscectomy  Arther Abbott 06/16/2012, 12:26 PM

## 2012-06-17 ENCOUNTER — Encounter (HOSPITAL_COMMUNITY)
Admission: RE | Disposition: A | Discharge status: Home / Self Care | Payer: Self-pay | Source: Ambulatory Visit | Attending: Orthopedic Surgery

## 2012-06-17 ENCOUNTER — Ambulatory Visit (HOSPITAL_COMMUNITY)
Admission: RE | Admit: 2012-06-17 | Discharge: 2012-06-17 | Disposition: A | Discharge status: Home / Self Care | Payer: 59 | Source: Ambulatory Visit | Attending: Orthopedic Surgery | Admitting: Orthopedic Surgery

## 2012-06-17 ENCOUNTER — Ambulatory Visit (HOSPITAL_COMMUNITY): Payer: 59 | Admitting: Anesthesiology

## 2012-06-17 ENCOUNTER — Encounter (HOSPITAL_COMMUNITY): Payer: Self-pay | Admitting: *Deleted

## 2012-06-17 ENCOUNTER — Encounter (HOSPITAL_COMMUNITY): Payer: Self-pay | Admitting: Anesthesiology

## 2012-06-17 DIAGNOSIS — G4733 Obstructive sleep apnea (adult) (pediatric): Secondary | ICD-10-CM | POA: Insufficient documentation

## 2012-06-17 DIAGNOSIS — IMO0002 Reserved for concepts with insufficient information to code with codable children: Secondary | ICD-10-CM | POA: Insufficient documentation

## 2012-06-17 DIAGNOSIS — M23329 Other meniscus derangements, posterior horn of medial meniscus, unspecified knee: Secondary | ICD-10-CM

## 2012-06-17 DIAGNOSIS — M2240 Chondromalacia patellae, unspecified knee: Secondary | ICD-10-CM | POA: Insufficient documentation

## 2012-06-17 DIAGNOSIS — M23305 Other meniscus derangements, unspecified medial meniscus, unspecified knee: Secondary | ICD-10-CM | POA: Insufficient documentation

## 2012-06-17 DIAGNOSIS — Z01812 Encounter for preprocedural laboratory examination: Secondary | ICD-10-CM | POA: Insufficient documentation

## 2012-06-17 DIAGNOSIS — M1710 Unilateral primary osteoarthritis, unspecified knee: Secondary | ICD-10-CM

## 2012-06-17 DIAGNOSIS — I10 Essential (primary) hypertension: Secondary | ICD-10-CM | POA: Insufficient documentation

## 2012-06-17 HISTORY — PX: CHONDROPLASTY: SHX5177

## 2012-06-17 SURGERY — ARTHROSCOPY, KNEE, WITH MEDIAL MENISCECTOMY
Anesthesia: General | Laterality: Right

## 2012-06-17 SURGERY — ARTHROSCOPY, KNEE, WITH MEDIAL MENISCECTOMY
Anesthesia: General | Site: Knee | Laterality: Right | Wound class: Clean

## 2012-06-17 MED ORDER — PROPOFOL 10 MG/ML IV BOLUS
INTRAVENOUS | Status: DC | PRN
  Administered 2012-06-17: 200 mg via INTRAVENOUS

## 2012-06-17 MED ORDER — CEFAZOLIN SODIUM-DEXTROSE 2-3 GM-% IV SOLR: 2.0000 g | INTRAVENOUS | Status: DC

## 2012-06-17 MED ORDER — BUPIVACAINE-EPINEPHRINE PF 0.5-1:200000 % IJ SOLN
INTRAMUSCULAR | Status: AC
  Filled 2012-06-17: qty 20

## 2012-06-17 MED ORDER — KETOROLAC TROMETHAMINE 30 MG/ML IJ SOLN
30.0000 mg | Freq: Once | INTRAMUSCULAR | Status: AC
  Administered 2012-06-17: 30 mg via INTRAVENOUS

## 2012-06-17 MED ORDER — PROPOFOL 10 MG/ML IV EMUL
INTRAVENOUS | Status: AC
  Filled 2012-06-17: qty 20

## 2012-06-17 MED ORDER — OXYCODONE-ACETAMINOPHEN 5-325 MG PO TABS: 1.0000 | ORAL_TABLET | ORAL | Status: DC | PRN

## 2012-06-17 MED ORDER — KETOROLAC TROMETHAMINE 30 MG/ML IJ SOLN
INTRAMUSCULAR | Status: AC
  Filled 2012-06-17: qty 1

## 2012-06-17 MED ORDER — MIDAZOLAM HCL 2 MG/2ML IJ SOLN
1.0000 mg | INTRAMUSCULAR | Status: DC | PRN
  Administered 2012-06-17: 2 mg via INTRAVENOUS

## 2012-06-17 MED ORDER — SODIUM CHLORIDE 0.9 % IR SOLN
Status: DC | PRN
  Administered 2012-06-17 (×3)

## 2012-06-17 MED ORDER — FENTANYL CITRATE 0.05 MG/ML IJ SOLN
INTRAMUSCULAR | Status: AC
  Filled 2012-06-17: qty 2

## 2012-06-17 MED ORDER — EPINEPHRINE HCL 1 MG/ML IJ SOLN
INTRAMUSCULAR | Status: AC
  Filled 2012-06-17: qty 5

## 2012-06-17 MED ORDER — MIDAZOLAM HCL 2 MG/2ML IJ SOLN
INTRAMUSCULAR | Status: AC
  Filled 2012-06-17: qty 2

## 2012-06-17 MED ORDER — LIDOCAINE HCL (PF) 1 % IJ SOLN
INTRAMUSCULAR | Status: AC
  Filled 2012-06-17: qty 5

## 2012-06-17 MED ORDER — ACETAMINOPHEN 500 MG PO TABS
ORAL_TABLET | ORAL | Status: AC
  Filled 2012-06-17: qty 1

## 2012-06-17 MED ORDER — ACETAMINOPHEN 500 MG PO TABS
500.0000 mg | ORAL_TABLET | Freq: Once | ORAL | Status: AC
  Administered 2012-06-17: 500 mg via ORAL

## 2012-06-17 MED ORDER — FENTANYL CITRATE 0.05 MG/ML IJ SOLN
INTRAMUSCULAR | Status: DC | PRN
  Administered 2012-06-17: 25 ug via INTRAVENOUS
  Administered 2012-06-17: 50 ug via INTRAVENOUS
  Administered 2012-06-17: 25 ug via INTRAVENOUS
  Administered 2012-06-17 (×2): 50 ug via INTRAVENOUS

## 2012-06-17 MED ORDER — LIDOCAINE HCL 1 % IJ SOLN
INTRAMUSCULAR | Status: DC | PRN
  Administered 2012-06-17: 50 mg via INTRADERMAL

## 2012-06-17 MED ORDER — FENTANYL CITRATE 0.05 MG/ML IJ SOLN
25.0000 ug | INTRAMUSCULAR | Status: DC | PRN
  Administered 2012-06-17 (×2): 25 ug via INTRAVENOUS
  Administered 2012-06-17: 50 ug via INTRAVENOUS

## 2012-06-17 MED ORDER — HYDROCODONE-ACETAMINOPHEN 5-325 MG PO TABS
1.0000 | ORAL_TABLET | Freq: Once | ORAL | Status: AC
  Administered 2012-06-17: 1 via ORAL

## 2012-06-17 MED ORDER — ONDANSETRON HCL 4 MG/2ML IJ SOLN: 4.0000 mg | Freq: Once | INTRAMUSCULAR | Status: DC | PRN

## 2012-06-17 MED ORDER — ONDANSETRON HCL 4 MG/2ML IJ SOLN
4.0000 mg | Freq: Once | INTRAMUSCULAR | Status: AC
  Administered 2012-06-17: 4 mg via INTRAVENOUS

## 2012-06-17 MED ORDER — BUPIVACAINE-EPINEPHRINE PF 0.5-1:200000 % IJ SOLN
INTRAMUSCULAR | Status: DC | PRN
  Administered 2012-06-17 (×2): 30 mL

## 2012-06-17 MED ORDER — ONDANSETRON HCL 4 MG/2ML IJ SOLN
INTRAMUSCULAR | Status: AC
  Filled 2012-06-17: qty 2

## 2012-06-17 MED ORDER — HYDROCODONE-ACETAMINOPHEN 5-325 MG PO TABS
ORAL_TABLET | ORAL | Status: AC
  Filled 2012-06-17: qty 1

## 2012-06-17 MED ORDER — SODIUM CHLORIDE 0.9 % IR SOLN
Status: DC | PRN
  Administered 2012-06-17: 1000 mL

## 2012-06-17 MED ORDER — LACTATED RINGERS IV SOLN
INTRAVENOUS | Status: DC
  Administered 2012-06-17: 1000 mL via INTRAVENOUS

## 2012-06-17 MED ORDER — MIDAZOLAM HCL 5 MG/5ML IJ SOLN
INTRAMUSCULAR | Status: DC | PRN
  Administered 2012-06-17: 2 mg via INTRAVENOUS

## 2012-06-17 MED ORDER — CEFAZOLIN SODIUM-DEXTROSE 2-3 GM-% IV SOLR
INTRAVENOUS | Status: AC
  Filled 2012-06-17: qty 50

## 2012-06-17 SURGICAL SUPPLY — 57 items
ARTHROWAND PARAGON T2 (SURGICAL WAND)
BAG HAMPER (MISCELLANEOUS) ×2 IMPLANT
BANDAGE ELASTIC 6 VELCRO NS (GAUZE/BANDAGES/DRESSINGS) ×2 IMPLANT
BLADE AGGRESSIVE PLUS 4.0 (BLADE) ×2 IMPLANT
BLADE SURG SZ11 CARB STEEL (BLADE) ×2 IMPLANT
CHLORAPREP W/TINT 26ML (MISCELLANEOUS) ×3 IMPLANT
CLOTH BEACON ORANGE TIMEOUT ST (SAFETY) ×2 IMPLANT
COOLER CRYO IC GRAV AND TUBE (ORTHOPEDIC SUPPLIES) ×2 IMPLANT
CUFF CRYO KNEE LG 20X31 COOLER (ORTHOPEDIC SUPPLIES) IMPLANT
CUFF CRYO KNEE18X23 MED (MISCELLANEOUS) ×1 IMPLANT
CUFF TOURNIQUET SINGLE 34IN LL (TOURNIQUET CUFF) ×1 IMPLANT
CUFF TOURNIQUET SINGLE 44IN (TOURNIQUET CUFF) IMPLANT
CUTTER ANGLED DBL BITE 4.5 (BURR) IMPLANT
DECANTER SPIKE VIAL GLASS SM (MISCELLANEOUS) ×4 IMPLANT
EPINEPHRINE 1 MG/ML (1:1000)-1ML IMPLANT
FLOOR PAD 36X40 (MISCELLANEOUS)
GAUZE SPONGE 4X4 16PLY XRAY LF (GAUZE/BANDAGES/DRESSINGS) ×2 IMPLANT
GAUZE XEROFORM 5X9 LF (GAUZE/BANDAGES/DRESSINGS) ×2 IMPLANT
GLOVE BIOGEL PI IND STRL 7.5 (GLOVE) IMPLANT
GLOVE BIOGEL PI INDICATOR 7.5 (GLOVE) ×2
GLOVE ECLIPSE 7.0 STRL STRAW (GLOVE) ×1 IMPLANT
GLOVE EXAM NITRILE MD LF STRL (GLOVE) ×2 IMPLANT
GLOVE SKINSENSE NS SZ8.0 LF (GLOVE) ×1
GLOVE SKINSENSE STRL SZ8.0 LF (GLOVE) ×1 IMPLANT
GLOVE SS N UNI LF 8.5 STRL (GLOVE) ×2 IMPLANT
GOWN STRL REIN XL XLG (GOWN DISPOSABLE) ×6 IMPLANT
HLDR LEG FOAM (MISCELLANEOUS) ×1 IMPLANT
IV NS IRRIG 3000ML ARTHROMATIC (IV SOLUTION) ×5 IMPLANT
KIT BLADEGUARD II DBL (SET/KITS/TRAYS/PACK) ×2 IMPLANT
KIT ROOM TURNOVER AP CYSTO (KITS) ×2 IMPLANT
LEG HOLDER FOAM (MISCELLANEOUS) ×1
MANIFOLD NEPTUNE II (INSTRUMENTS) ×2 IMPLANT
MARKER SKIN DUAL TIP RULER LAB (MISCELLANEOUS) ×2 IMPLANT
NDL HYPO 18GX1.5 BLUNT FILL (NEEDLE) ×1 IMPLANT
NDL HYPO 21X1.5 SAFETY (NEEDLE) ×1 IMPLANT
NDL SPNL 18GX3.5 QUINCKE PK (NEEDLE) ×1 IMPLANT
NEEDLE HYPO 18GX1.5 BLUNT FILL (NEEDLE) ×2 IMPLANT
NEEDLE HYPO 21X1.5 SAFETY (NEEDLE) ×2 IMPLANT
NEEDLE SPNL 18GX3.5 QUINCKE PK (NEEDLE) ×2 IMPLANT
NS IRRIG 1000ML POUR BTL (IV SOLUTION) ×2 IMPLANT
PACK ARTHRO LIMB DRAPE STRL (MISCELLANEOUS) ×2 IMPLANT
PAD ABD 5X9 TENDERSORB (GAUZE/BANDAGES/DRESSINGS) ×2 IMPLANT
PAD ARMBOARD 7.5X6 YLW CONV (MISCELLANEOUS) ×2 IMPLANT
PAD FLOOR 36X40 (MISCELLANEOUS) ×1 IMPLANT
PADDING CAST COTTON 6X4 STRL (CAST SUPPLIES) ×2 IMPLANT
SET ARTHROSCOPY INST (INSTRUMENTS) ×2 IMPLANT
SET ARTHROSCOPY PUMP TUBE (IRRIGATION / IRRIGATOR) ×2 IMPLANT
SET BASIN LINEN APH (SET/KITS/TRAYS/PACK) ×2 IMPLANT
SPONGE GAUZE 4X4 12PLY (GAUZE/BANDAGES/DRESSINGS) ×2 IMPLANT
STRIP CLOSURE SKIN 1/2X4 (GAUZE/BANDAGES/DRESSINGS) ×2 IMPLANT
SUT ETHILON 3 0 FSL (SUTURE) ×1 IMPLANT
SYR 30ML LL (SYRINGE) ×2 IMPLANT
SYRINGE 10CC LL (SYRINGE) ×2 IMPLANT
WAND 50 DEG COVAC W/CORD (SURGICAL WAND) ×1 IMPLANT
WAND 90 DEG TURBOVAC W/CORD (SURGICAL WAND) IMPLANT
WAND ARTHRO PARAGON T2 (SURGICAL WAND) IMPLANT
YANKAUER SUCT BULB TIP 10FT TU (MISCELLANEOUS) ×7 IMPLANT

## 2012-06-17 NOTE — Anesthesia Preprocedure Evaluation (Signed)
Anesthesia Evaluation  Patient identified by MRN, date of birth, ID band Patient awake    Reviewed: Allergy & Precautions, H&P , NPO status , Patient's Chart, lab work & pertinent test results, reviewed documented beta blocker date and time   Airway Mallampati: II TM Distance: >3 FB Neck ROM: full    Dental  (+) Teeth Intact   Pulmonary shortness of breath and with exertion, asthma , sleep apnea ,  breath sounds clear to auscultation        Cardiovascular hypertension, On Medications negative cardio ROS  Rhythm:regular     Neuro/Psych  Neuromuscular disease negative psych ROS   GI/Hepatic Neg liver ROS, Medicated and Controlled,  Endo/Other  negative endocrine ROS  Renal/GU negative Renal ROS  negative genitourinary   Musculoskeletal   Abdominal   Peds  Hematology negative hematology ROS (+)   Anesthesia Other Findings See surgeon's H&P   Reproductive/Obstetrics negative OB ROS                           Anesthesia Physical Anesthesia Plan  ASA: II  Anesthesia Plan: General   Post-op Pain Management:    Induction: Intravenous  Airway Management Planned: LMA  Additional Equipment:   Intra-op Plan:   Post-operative Plan: Extubation in OR  Informed Consent: I have reviewed the patients History and Physical, chart, labs and discussed the procedure including the risks, benefits and alternatives for the proposed anesthesia with the patient or authorized representative who has indicated his/her understanding and acceptance.     Plan Discussed with:   Anesthesia Plan Comments:         Anesthesia Quick Evaluation

## 2012-06-17 NOTE — Anesthesia Postprocedure Evaluation (Signed)
  Anesthesia Post-op Note  Patient: [name]  Procedure(s) Performed: Procedure(s) (LRB) with comments: KNEE ARTHROSCOPY WITH MEDIAL MENISECTOMY (Right) CHONDROPLASTY (Right) - right patella  Patient Location: PACU  Anesthesia Type: General  Level of Consciousness: awake, alert , oriented and patient cooperative  Airway and Oxygen Therapy: Patient Spontanous Breathing  Post-op Pain: 3 /10, mild  Post-op Assessment: Post-op Vital signs reviewed, Patient's Cardiovascular Status Stable, Respiratory Function Stable, Patent Airway, No signs of Nausea or vomiting and Pain level controlled  Post-op Vital Signs: Reviewed and stable  Complications: No apparent anesthesia complications

## 2012-06-17 NOTE — Anesthesia Procedure Notes (Signed)
Procedure Name: LMA Insertion Date/Time: 06/17/2012 7:48 AM Performed by: Charmaine Downs Pre-anesthesia Checklist: Patient identified, Emergency Drugs available, Suction available and Patient being monitored Patient Re-evaluated:Patient Re-evaluated prior to inductionOxygen Delivery Method: Circle system utilized Preoxygenation: Pre-oxygenation with 100% oxygen Intubation Type: IV induction Ventilation: Mask ventilation without difficulty LMA Size: 3.0 Number of attempts: 1 Placement Confirmation: positive ETCO2 and breath sounds checked- equal and bilateral Tube secured with: Tape Dental Injury: Teeth and Oropharynx as per pre-operative assessment

## 2012-06-17 NOTE — Brief Op Note (Addendum)
06/17/2012  8:41 AM  PATIENT:  [name]  46 y.o. female  PRE-OPERATIVE DIAGNOSIS:  medial meniscus tear and osteoarthritis right knee  POST-OPERATIVE DIAGNOSIS:  medial meniscus tear and osteoarthritis right knee  FINDINGS: GR 2 PATELLA CHONDROMALACIA, WITH FLAP TEAR, GR 1 MFC AND TIBIAL PLATEAU, GR 2 TROCHLEA, MEDIAL FEM CONDYLE SPUR  TORN MEDIAL MENISCUS   PROCEDURE:  Procedure(s) (LRB) with comments: KNEE ARTHROSCOPY WITH MEDIAL MENISECTOMY (Right) CHONDROPLASTY (Right) - right patella  DETAILS : Was identified in the preoperative area and the surgical site was confirmed and marked. A review of the chart was completed.  The patient was given 2 g of Ancef intravenously and taken to the operating room for general anesthesia with LMA technique. Anesthesia induction was smooth without complication. The patient was in the supine position. The right leg was placed in an arthroscopic leg holder. The left leg was placed in flexion on a padded leg holder.  The right leg was then prepped and draped sterilely.   the timeout was completed by confirming the patient identification, procedure, equipment and appropriate imaging.  A lateral portal was established and the scope was placed into the patella femoral joint. A diagnostic arthroscopy was completed. Immediately noted was a significant and severe synovitis in the patellofemoral area as well as the remaining portions of the joint. A large chondral lesion with a flap tear was noted on the medial facet of the patella. A medial femoral osteophyte was noted. A medial meniscal tear in the posterior horn was noted. Anterior cruciate ligament was intact. The lateral compartment was normal. Note also the medial femoral compartment had grade 1 chondromalacia of the medial femoral condyle and the tibial plateau.  A medial portal was established and the medial meniscal tear was resected. Meniscal fragments were removed with a motorized shaver. A 50  ArthroCare wand was then used to balance the meniscus. A stable rim was confirmed with the probe.  The scope was then placed back into the patellofemoral joint and a chondroplasty was performed with a motorized shaver to a smooth rim of articular cartilage remained on the medial facet.  The joint was then irrigated and the portals were closed with 3 3-0 nylon sutures.  35 cc of Marcaine with epinephrine was injected into the joint.  Sterile dressing was applied and a Cryo/Cuff was placed and activated.  Patient will be discharged home with a followup scheduled for Monday.  SURGEON:  Surgeon(s) and Role:    * Carole Civil, MD - Primary  PHYSICIAN ASSISTANT:   ASSISTANTS: none   ANESTHESIA:   general  EBL:  Total I/O In: 400 [I.V.:400] Out: -   BLOOD ADMINISTERED:none  DRAINS: none   LOCAL MEDICATIONS USED:  MARCAINE   , Amount: 60 ml and OTHER epi   SPECIMEN:  No Specimen  DISPOSITION OF SPECIMEN:  N/A  COUNTS:  YES  TOURNIQUET:  * Missing tourniquet times found for documented tourniquets in log:  [id number] *  DICTATION: .Dragon Dictation  PLAN OF CARE: Discharge to home after PACU  PATIENT DISPOSITION:  PACU - hemodynamically stable.   Delay start of Pharmacological VTE agent (>24hrs) due to surgical blood loss or risk of bleeding: not applicable

## 2012-06-17 NOTE — Op Note (Signed)
06/17/2012  8:41 AM  PATIENT:  [name]  46 y.o. female  PRE-OPERATIVE DIAGNOSIS:  medial meniscus tear and osteoarthritis right knee  POST-OPERATIVE DIAGNOSIS:  medial meniscus tear and osteoarthritis right knee  FINDINGS: GR 2 PATELLA CHONDROMALACIA, WITH FLAP TEAR, GR 1 MFC AND TIBIAL PLATEAU, GR 2 TROCHLEA, MEDIAL FEM CONDYLE SPUR  TORN MEDIAL MENISCUS   PROCEDURE:  Procedure(s) (LRB) with comments: KNEE ARTHROSCOPY WITH MEDIAL MENISECTOMY (Right) CHONDROPLASTY (Right) - right patella  DETAILS : Was identified in the preoperative area and the surgical site was confirmed and marked. A review of the chart was completed.  The patient was given 2 g of Ancef intravenously and taken to the operating room for general anesthesia with LMA technique. Anesthesia induction was smooth without complication. The patient was in the supine position. The right leg was placed in an arthroscopic leg holder. The left leg was placed in flexion on a padded leg holder.  The right leg was then prepped and draped sterilely.   the timeout was completed by confirming the patient identification, procedure, equipment and appropriate imaging.  A lateral portal was established and the scope was placed into the patella femoral joint. A diagnostic arthroscopy was completed. Immediately noted was a significant and severe synovitis in the patellofemoral area as well as the remaining portions of the joint. A large chondral lesion with a flap tear was noted on the medial facet of the patella. A medial femoral osteophyte was noted. A medial meniscal tear in the posterior horn was noted. Anterior cruciate ligament was intact. The lateral compartment was normal. Note also the medial femoral compartment had grade 1 chondromalacia of the medial femoral condyle and the tibial plateau.  A medial portal was established and the medial meniscal tear was resected. Meniscal fragments were removed with a motorized shaver. A 50  ArthroCare wand was then used to balance the meniscus. A stable rim was confirmed with the probe.  The scope was then placed back into the patellofemoral joint and a chondroplasty was performed with a motorized shaver to a smooth rim of articular cartilage remained on the medial facet.  The joint was then irrigated and the portals were closed with 3 3-0 nylon sutures.  35 cc of Marcaine with epinephrine was injected into the joint.  Sterile dressing was applied and a Cryo/Cuff was placed and activated.  Patient will be discharged home with a followup scheduled for Monday.  SURGEON:  Surgeon(s) and Role:    * Carole Civil, MD - Primary  PHYSICIAN ASSISTANT:   ASSISTANTS: none   ANESTHESIA:   general  EBL:  Total I/O In: 400 [I.V.:400] Out: -   BLOOD ADMINISTERED:none  DRAINS: none   LOCAL MEDICATIONS USED:  MARCAINE   , Amount: 60 ml and OTHER epi   SPECIMEN:  No Specimen  DISPOSITION OF SPECIMEN:  N/A  COUNTS:  YES  TOURNIQUET:  * Missing tourniquet times found for documented tourniquets in log:  [id number] *  DICTATION: .Dragon Dictation  PLAN OF CARE: Discharge to home after PACU  PATIENT DISPOSITION:  PACU - hemodynamically stable.   Delay start of Pharmacological VTE agent (>24hrs) due to surgical blood loss or risk of bleeding: not applicable

## 2012-06-17 NOTE — Transfer of Care (Signed)
Immediate Anesthesia Transfer of Care Note  Patient: [name]  Procedure(s) Performed: Procedure(s) (LRB) with comments: KNEE ARTHROSCOPY WITH MEDIAL MENISECTOMY (Right) CHONDROPLASTY (Right) - right patella  Patient Location: PACU  Anesthesia Type: General  Level of Consciousness: awake and patient cooperative  Airway & Oxygen Therapy: Patient Spontanous Breathing and Patient connected to face mask oxygen  Post-op Assessment: Report given to PACU RN, Post -op Vital signs reviewed and stable and Patient moving all extremities  Post vital signs: Reviewed and stable  Complications: No apparent anesthesia complications

## 2012-06-17 NOTE — Interval H&P Note (Signed)
History and Physical Interval Note:  06/17/2012 7:30 AM  [name]  has presented today for surgery, with the diagnosis of medial meniscus tear and osteoarthritis right knee  The various methods of treatment have been discussed with the patient and family. After consideration of risks, benefits and other options for treatment, the patient has consented to  Procedure(s) (LRB) with comments: KNEE ARTHROSCOPY WITH MEDIAL MENISECTOMY (Right) as a surgical intervention .  The patient's history has been reviewed, patient examined, no change in status, stable for surgery.  I have reviewed the patient's chart and labs.  Questions were answered to the patient's satisfaction.     Arther Abbott

## 2012-06-20 ENCOUNTER — Ambulatory Visit (INDEPENDENT_AMBULATORY_CARE_PROVIDER_SITE_OTHER): Payer: 59 | Admitting: Orthopedic Surgery

## 2012-06-20 ENCOUNTER — Encounter: Payer: Self-pay | Admitting: Orthopedic Surgery

## 2012-06-20 VITALS — BP 110/80 | Ht 72.0 in | Wt 246.0 lb

## 2012-06-20 DIAGNOSIS — Z9889 Other specified postprocedural states: Secondary | ICD-10-CM

## 2012-06-20 MED FILL — Epinephrine HCl Inj 1 MG/ML: INTRAMUSCULAR | Qty: 1 | Status: AC

## 2012-06-20 NOTE — Progress Notes (Signed)
Patient ID: [name], female   DOB: 1966-08-16, 46 y.o.   MRN: [id number] Chief Complaint  Patient presents with  . Follow-up    Post op #1, right knee.    Arthroscopy right knee partial medial meniscectomy chondroplasty patella  Operative findings torn posterior horn medial meniscus degenerative arthritis grade 2-3 lesion patella  Emphasized quad sets to help with knee extension  Start physical therapy  Return in 3 weeks  Continue out of work status 3-4 weeks

## 2012-06-20 NOTE — Patient Instructions (Addendum)
Start PT at [facility] use weight bearing as tolerated crutches as needed

## 2012-06-21 ENCOUNTER — Encounter (HOSPITAL_COMMUNITY): Payer: Self-pay | Admitting: Orthopedic Surgery

## 2012-06-29 ENCOUNTER — Ambulatory Visit (HOSPITAL_COMMUNITY)
Admission: RE | Admit: 2012-06-29 | Discharge: 2012-06-29 | Disposition: A | Discharge status: Home / Self Care | Payer: 59 | Source: Ambulatory Visit | Attending: Family Medicine | Admitting: Family Medicine

## 2012-06-29 DIAGNOSIS — R29898 Other symptoms and signs involving the musculoskeletal system: Secondary | ICD-10-CM | POA: Insufficient documentation

## 2012-06-29 DIAGNOSIS — IMO0001 Reserved for inherently not codable concepts without codable children: Secondary | ICD-10-CM | POA: Insufficient documentation

## 2012-06-29 DIAGNOSIS — R262 Difficulty in walking, not elsewhere classified: Secondary | ICD-10-CM | POA: Insufficient documentation

## 2012-06-29 DIAGNOSIS — M6281 Muscle weakness (generalized): Secondary | ICD-10-CM | POA: Insufficient documentation

## 2012-06-29 DIAGNOSIS — M25569 Pain in unspecified knee: Secondary | ICD-10-CM | POA: Insufficient documentation

## 2012-06-29 NOTE — Evaluation (Signed)
Physical Therapy Evaluation  Patient Details  Name: [name] MRN: [id number] Date of Birth: [birth year]  Today's Date: 06/29/2012 Time: 1445-1520 PT Time Calculation (min): 35 min  Visit#: 1  of 8   Re-eval: 07/29/12 Assessment Diagnosis: s/p L arthroscopic surgery Surgical Date: 06/01/12 Next MD Visit: 07/12/2012 Prior Therapy: OP Feb. 2013; with minimal success.  Authorization: UMR  Authorization Time Period:    Authorization Visit#:   of     Past Medical History:  Past Medical History  Diagnosis Date  . Chest pain     cath 2005 norm cors, repeat cath Feb 2011 normal cors and only minimally  elevated pulmonary pressures  . Palpitations   . Obesity   . Hiatal hernia   . Sleep apnea sleep study 11/03/2009    mild-no cpap  . GERD (gastroesophageal reflux disease)   . Crohn disease    Past Surgical History:  Past Surgical History  Procedure Date  . Cesarean section   . Abdominal hysterectomy   . Shoulder surgery     right x 2   . Cholecystectomy 09/08/2006    lap. chole.  . Inguinal hernia repair 10/30/2008    right  . Ankle arthroscopy 06/01/2012    Procedure: ANKLE ARTHROSCOPY;  Surgeon: Colin Rhein, MD;  Location: [facility];  Service: Orthopedics;  Laterality: Left;  left ankle arthorsocpy with extensive debridement and gastroc slide  . Cardiac catheterization 11/22/2009 and 2005    WNL  . Chondroplasty 06/17/2012    Procedure: CHONDROPLASTY;  Surgeon: Carole Civil, MD;  Location: [facility];  Service: Orthopedics;  Laterality: Right;  right patella    Subjective Symptoms/Limitations Symptoms: Pt has had left ankle pain for over nine months.  She opted to have surgery on 06/01/12.  At this point the patient states that her ankle pain is much better.  She came out of her cam boot on 9/11 and is now using an ankle brace and has done well. he patient states at this time she is mainly wearing her brace outside.  She has been referred for  therapy. Pertinent History: Pt has had knee arthroscopic surgery 9/13 How long can you stand comfortably?: The patient states that she does not have any increased pain with standing. How long can you walk comfortably?: The patient is limited in walking due to her knee .  The most she has walked is five minutes with a crutch. Patient Stated Goals: To be more ambulatory  Pain Assessment Currently in Pain?: No/denies Pain Score: 0-No pain Pain Location: Ankle Pain Orientation: Left Pain Type: Chronic pain Multiple Pain Sites: Yes    Prior Functioning  Home Living Type of Home: House Home Access: Stairs to enter Entrance Stairs-Number of Steps:  (12)- pt is unable to complete steps but this is due to her R knee not her L ankle. Prior Function Able to Take Stairs?: Reciprically     Assessment LLE AROM (degrees) Left Ankle Dorsiflexion: 10  Left Ankle Plantar Flexion: 40  Left Ankle Inversion: 30  Left Ankle Eversion: 12  LLE Strength Left Ankle Dorsiflexion: 4/5 Left Ankle Plantar Flexion: 3-/5 Left Ankle Inversion: 4/5 Left Ankle Eversion: 3+/5  Exercise/Treatments Mobility/Balance  Static Standing Balance Single Leg Stance - Right Leg: 10  Single Leg Stance - Left Leg: 3    Ankle Exercises - Standing SLS: x5 max 10 sec. Ankle Exercises - Supine T-Band:  (all blue t-band given to pt x 10 reps.)  Physical Therapy Assessment and Plan PT Assessment and Plan Clinical Impression Statement: PT doing well after L ankle arthroscopic surrgery.  Pt will benefit from skilled PT to progress pt to prior functional level needing strengthening especially for plantarflexion and eversion and proprioception  Pt will benefit from skilled therapeutic intervention in order to improve on the following deficits: Decreased activity tolerance;Difficulty walking;Decreased strength;Decreased balance Rehab Potential: Good PT Frequency: Min 2X/week (Pt can only afford two times a week; order  was for three ) PT Duration: 4 weeks PT Treatment/Interventions: Gait training;Therapeutic activities;Therapeutic exercise;Modalities PT Plan: begin rockerboard; gastroc stretch; standing Baps at level 3;  heel raise; cybex PF next treatment;  4th treatment add toe raises Pension scheme manager; Yoga tree with  R toes on ground;  attempt  Single leg heel raise and attempt heel walk.    Goals Home Exercise Program Pt will Perform Home Exercise Program: Independently PT Short Term Goals Time to Complete Short Term Goals: 2 weeks PT Short Term Goal 1: Pt to be walking without brace at all times. PT Short Term Goal 2: Pt  SLS to be 15 PT Long Term Goals Time to Complete Long Term Goals: 4 weeks PT Long Term Goal 1: Pt to be able to walk for 60 minutes without difficulty  PT Long Term Goal 2: Pt to have normal strength. Long Term Goal 3: Pt to be I in advance HEP.  Problem List Patient Active Problem List  Diagnosis  . HYPERTENSION  . HIATAL HERNIA  . KNEE, ARTHRITIS, DEGEN./OSTEO  . PAIN IN JOINT, MULTIPLE SITES  . PALPITATIONS  . DYSPNEA  . Chest pain  . Posterior tibial tendonitis  . PTTD (posterior tibial tendon dysfunction)  . Knee pain, right  . Difficulty in walking    PT - End of Session Activity Tolerance: Patient tolerated treatment well General Behavior During Session: [facility] for tasks performed Cognition: [facility] for tasks performed PT Plan of Care PT Home Exercise Plan: given PT Patient Instructions: t-band and gastroc stretch Consulted and Agree with Plan of Care: Patient  GP    RUSSELL,CINDY 06/29/2012, 4:18 PM  Physician Documentation Your signature is required to indicate approval of the treatment plan as stated above.  Please sign and either send electronically or make a copy of this report for your files and return this physician signed original.   Please mark one 1.__approve of plan  2. ___approve of plan with the following  conditions.   ______________________________                                                          _____________________ Physician Signature                                                                                                             Date

## 2012-06-29 NOTE — Evaluation (Signed)
Physical Therapy Evaluation  Patient Details  Name: [name] MRN: [id number] Date of Birth: September 01, 1966  Today's Date: 06/29/2012 Time: 1520-1600 PT Time Calculation (min): 40 min  Visit#: 2  of 8   Re-eval: 07/29/12 Assessment Diagnosis: S/P R artroscopic surgery  Surgical Date: 06/17/12 Next MD Visit: 10/15 Prior Therapy: OP Feb. 2013; with minimal success.  Authorization: UMR   Past Medical History:  Past Medical History  Diagnosis Date  . Chest pain     cath 2005 norm cors, repeat cath Feb 2011 normal cors and only minimally  elevated pulmonary pressures  . Palpitations   . Obesity   . Hiatal hernia   . Sleep apnea sleep study 11/03/2009    mild-no cpap  . GERD (gastroesophageal reflux disease)   . Crohn disease    Past Surgical History:  Past Surgical History  Procedure Date  . Cesarean section   . Abdominal hysterectomy   . Shoulder surgery     right x 2   . Cholecystectomy 09/08/2006    lap. chole.  . Inguinal hernia repair 10/30/2008    right  . Ankle arthroscopy 06/01/2012    Procedure: ANKLE ARTHROSCOPY;  Surgeon: Colin Rhein, MD;  Location: [facility];  Service: Orthopedics;  Laterality: Left;  left ankle arthorsocpy with extensive debridement and gastroc slide  . Cardiac catheterization 11/22/2009 and 2005    WNL  . Chondroplasty 06/17/2012    Procedure: CHONDROPLASTY;  Surgeon: Carole Civil, MD;  Location: [facility];  Service: Orthopedics;  Laterality: Right;  right patella    Subjective Symptoms/Limitations Symptoms: Pt states that she has had right knee pain for years.  Her pain increased to a point that she opted to have arthroscopic surgery on 06/17/2012.  She states that she has been icing her knee but continues to have swelling.  She states that her pain is about the same as prior to surgery.  She is being referred to therapy to decrease her pain improve her functional mobility. Pertinent History: Pt has had L ankle surgery  on 06/01/2012 How long can you sit comfortably?: The patient states is she is not in a recliner she is only able to sit for about 15 minutes. How long can you stand comfortably?: The Pt states that she tends to put more wt on her L foot when standing.  She limits her standing to 15 minutes of less. How long can you walk comfortably?: The patient is walking with one crutch.  The longest she has walked has been 15 minutes. Patient Stated Goals: To be able to get back to work Pain Assessment Currently in Pain?: No/denies Pain Score: 0-No pain Pain Location: Ankle Pain Orientation: Left Pain Type: Chronic pain Multiple Pain Sites: Yes     Prior Functioning  Home Living Type of Home: House Home Access: Stairs to enter Entrance Stairs-Number of Steps: 3 Home Layout: Two level Alternate Level Stairs-Number of Steps: 12 (Pt not completing steps as she has difficulty going down.) Prior Function Level of Independence: Independent with basic ADLs Able to Take Stairs?: Reciprically Vocation: Full time employment Vocation Requirements:  (12 hr shift; on feet all day)    Assessment RLE AROM (degrees) Right Knee Extension: 8  Right Knee Flexion: 95  RLE Strength Right Hip Flexion: 4/5 Right Hip Extension: 3/5 Right Hip ABduction: 5/5 Right Hip ADduction: 4/5 Right Knee Flexion: 3+/5 Right Knee Extension: 4/5 Right Ankle Dorsiflexion: 5/5 LLE AROM (degrees) Left Ankle Dorsiflexion: 10  Left  Ankle Plantar Flexion: 40  Left Ankle Inversion: 30  Left Ankle Eversion: 12  LLE Strength Left Ankle Dorsiflexion: 4/5 Left Ankle Plantar Flexion: 3-/5 Left Ankle Inversion: 4/5 Left Ankle Eversion: 3+/5  Exercise/Treatments Mobility/Balance  Static Standing Balance Single Leg Stance - Right Leg: 10  Single Leg Stance - Left Leg: 3    Stretches Active Hamstring Stretch: 2 reps;30 seconds   Supine Quad Sets: 10 reps Heel Slides: 10 reps Sidelying Hip ADduction: 10 reps Prone    Hamstring Curl: 10 reps Hip Extension: 10 reps     Physical Therapy Assessment and Plan PT Assessment and Plan Clinical Impression Statement: Pt with decreased strength of all  R LE mm; increased swelling and pain who will benefit from skilled PT to address the above deficits and return pt to prior functional level. Pt will benefit from skilled therapeutic intervention in order to improve on the following deficits: Decreased activity tolerance;Decreased balance;Decreased range of motion;Decreased strength;Increased edema;Pain Rehab Potential: Good PT Frequency: Min 2X/week PT Duration: 4 weeks PT Treatment/Interventions: Gait training;Therapeutic activities;Therapeutic exercise;Modalities PT Plan: Begin gentle PROM; standing knee flexion, rockerboard; heel raise; gastroc stretch, mini squat; lateral step ups Pt needs gait training for steps,;  Pt to recieve retro-massage to decrease swelling.    Goals Home Exercise Program Pt will Perform Home Exercise Program: Independently PT Short Term Goals Time to Complete Short Term Goals: 2 weeks PT Short Term Goal 1: Pt to be walking without a crutch PT Short Term Goal 2: Pt to states she is going up and down her steps. PT Short Term Goal 3: Pt pain to be no greater than a  5 PT Short Term Goal 4: ROM to be 0 to 110 for more normalized walking and comfort of sitting. PT Long Term Goals Time to Complete Long Term Goals:  (3 weeks) PT Long Term Goal 1: Pain to be no greater than a 2 80 % of the day PT Long Term Goal 2: Pt to have normal strength . Long Term Goal 3: Pt to be I in advance HEP Long Term Goal 4: Pt to be able to walk for 60 minutes without increased pain. PT Long Term Goal 5: Pt to be able to sit comfortably for 90 minutes to watch a movie or to travel. Additional PT Long Term Goals?: Yes PT Long Term Goal 6: normalize gait  Problem List Patient Active Problem List  Diagnosis  . HYPERTENSION  . HIATAL HERNIA  . KNEE,  ARTHRITIS, DEGEN./OSTEO  . PAIN IN JOINT, MULTIPLE SITES  . PALPITATIONS  . DYSPNEA  . Chest pain  . Posterior tibial tendonitis  . PTTD (posterior tibial tendon dysfunction)  . Knee pain, right  . Difficulty in walking  . Weakness of right leg    PT - End of Session Activity Tolerance: Patient tolerated treatment well General Behavior During Session: [facility] for tasks performed Cognition: [facility] for tasks performed PT Plan of Care PT Home Exercise Plan: given PT Patient Instructions: t-band and gastroc stretch Consulted and Agree with Plan of Care: Patient  GP    RUSSELL,CINDY 06/29/2012, 4:42 PM  Physician Documentation Your signature is required to indicate approval of the treatment plan as stated above.  Please sign and either send electronically or make a copy of this report for your files and return this physician signed original.   Please mark one 1.__approve of plan  2. ___approve of plan with the following conditions.   ______________________________  _____________________ Physician Signature                                                                                                             Date

## 2012-07-01 ENCOUNTER — Ambulatory Visit (HOSPITAL_COMMUNITY): Payer: Self-pay

## 2012-07-01 ENCOUNTER — Ambulatory Visit (HOSPITAL_COMMUNITY)
Admission: RE | Admit: 2012-07-01 | Discharge: 2012-07-01 | Disposition: A | Discharge status: Home / Self Care | Payer: 59 | Source: Ambulatory Visit | Attending: Orthopedic Surgery | Admitting: Orthopedic Surgery

## 2012-07-01 DIAGNOSIS — R29898 Other symptoms and signs involving the musculoskeletal system: Secondary | ICD-10-CM

## 2012-07-01 NOTE — Progress Notes (Signed)
Physical Therapy Treatment Patient Details  Name: [name] MRN: [id number] Date of Birth: January 21, 1966  Today's Date: 07/01/2012 Time: 1355-1445 PT Time Calculation (min): 50 min  Visit#: 2  of 8   Re-eval: 07/30/12  Charge: therex 28', manual 8', ice 10'  Authorization: UMR   Subjective: Symptoms/Limitations Symptoms: Session focus on R knee today due to time.  Pain scale 3/10, no pain meds this session. Pain Assessment Currently in Pain?: Yes Pain Score:   3 Pain Location: Knee Pain Orientation: Right  Objective:   Exercise/Treatments Stretches Active Hamstring Stretch: 3 reps;30 seconds Gastroc Stretch: 3 reps;30 seconds;Limitations Gastroc Stretch Limitations: BLE Standing Heel Raises: 15 reps Knee Flexion: 15 reps Lateral Step Up: Right;10 reps;Hand Hold: 1;Step Height: 4" Forward Step Up: Right;10 reps;Hand Hold: 1;Step Height: 4" Rocker Board: 2 minutes;Limitations Rocker Board Limitations: R/L  Retro massage with LE elevated x 8 min  Ice to L ankle and R knee with BLE elevated x 10  Physical Therapy Assessment and Plan PT Assessment and Plan Clinical Impression Statement: Session focus on R knee strengthening and edema reduction.  Pt able to demonstrate all therex with multimodal cueing for proper form and technique.  Able to reduce swelling with retro massage at end of session with pain reduced. PT Plan: Begin gentle PROM R knee.  Begin gait training for proper mechanics and step training.  Continue with retro-massage to decrease swelling.    Goals    Problem List Patient Active Problem List  Diagnosis  . HYPERTENSION  . HIATAL HERNIA  . KNEE, ARTHRITIS, DEGEN./OSTEO  . PAIN IN JOINT, MULTIPLE SITES  . PALPITATIONS  . DYSPNEA  . Chest pain  . Posterior tibial tendonitis  . PTTD (posterior tibial tendon dysfunction)  . Knee pain, right  . Difficulty in walking  . Weakness of right leg    PT - End of Session Activity Tolerance: Patient  tolerated treatment well General Behavior During Session: [facility] for tasks performed Cognition: [facility] for tasks performed  GP    Aldona Lento 07/01/2012, 6:21 PM

## 2012-07-04 ENCOUNTER — Ambulatory Visit (HOSPITAL_COMMUNITY): Payer: Self-pay | Admitting: *Deleted

## 2012-07-04 ENCOUNTER — Ambulatory Visit (HOSPITAL_COMMUNITY)
Admission: RE | Admit: 2012-07-04 | Discharge: 2012-07-04 | Disposition: A | Discharge status: Home / Self Care | Payer: 59 | Source: Ambulatory Visit | Attending: Orthopedic Surgery | Admitting: Orthopedic Surgery

## 2012-07-04 NOTE — Progress Notes (Signed)
Physical Therapy Treatment Patient Details  Name: [name] MRN: [id number] Date of Birth: 1966/02/02  Today's Date: 07/04/2012 Time: 1224-4975 PT Time Calculation (min): 47 min  Visit#: 3  of 8   Re-eval: 07/30/12 Charges: Therex x 32' Manual x 8'  Authorization: UMR    Subjective: Pt states she's been up on her knee more so it's a little sore. Pain 3/10 in R knee  Exercise/Treatments Heel Raises: 15 reps Knee Flexion: 15 reps Lateral Step Up: Right;Hand Hold: 1;Step Height: 4";15 reps Forward Step Up: Right;Hand Hold: 1;Step Height: 4";15 reps Functional Squat: 10 reps;3 seconds Rocker Board: 2 minutes;Limitations Rocker Board Limitations: R/L  Supine Heel Slides: 10 reps Knee Flexion: 10 reps   Manual Therapy Manual Therapy: Edema management Edema Management: Retro massage with R LE elevated x 8' Physical Therapy Assessment and Plan PT Assessment and Plan Clinical Impression Statement: Pt completes therex well with minimal need for cueing. Began heel slide and gentle PROM to increase R knee flexion. Retro massage completed again this session to decrease swelling. PT Plan: Continue to progress strength/ROM per PT POC.     Problem List Patient Active Problem List  Diagnosis  . HYPERTENSION  . HIATAL HERNIA  . KNEE, ARTHRITIS, DEGEN./OSTEO  . PAIN IN JOINT, MULTIPLE SITES  . PALPITATIONS  . DYSPNEA  . Chest pain  . Posterior tibial tendonitis  . PTTD (posterior tibial tendon dysfunction)  . Knee pain, right  . Difficulty in walking  . Weakness of right leg    PT - End of Session Activity Tolerance: Patient tolerated treatment well General Behavior During Session: [facility] for tasks performed Cognition: [facility] for tasks performed  Rachelle Hora, PTA 07/04/2012, 6:06 PM

## 2012-07-04 NOTE — Progress Notes (Signed)
Physical Therapy Treatment Patient Details  Name: [name] MRN: [id number] Date of Birth: 01-Apr-1966  Today's Date: 07/04/2012 Time: 4401-0272 PT Time Calculation (min): 45 min  Visit#: 4  of 8   Re-eval: 07/30/12 Charges: Therex x 30' Ice x 10'  Authorization: UMR   Subjective: Symptoms/Limitations Symptoms: Pt reprots that her ankle does not bother her and much as her knee. Pain Assessment Currently in Pain?: Yes Pain Score:   2 Pain Location: Ankle Pain Orientation: Left   Exercise/Treatments Ankle Stretches Slant Board Stretch: 3 reps;30 seconds Machines for Strengthening Cybex Leg Press: 1pl 2x10 Ankle Exercises - Standing BAPS: Level 3;Standing;5 reps SLS: L:13" max or 5 Rocker Board: 2 minutes (A/P)    Modalities Modalities: Cryotherapy Cryotherapy Number Minutes Cryotherapy: 10 Minutes Cryotherapy Location: Knee;Ankle Type of Cryotherapy: Ice pack  Physical Therapy Assessment and Plan PT Assessment and Plan Clinical Impression Statement: Progressed standing ankle therex with minimal difficulty. Pt appears to have most difficulty with inversion. Pt is without complaint throughout session. Ice applied at end of session to limit pain and swelling. PT Plan: Continue to progress strength/stability per PT POC.     Problem List Patient Active Problem List  Diagnosis  . HYPERTENSION  . HIATAL HERNIA  . KNEE, ARTHRITIS, DEGEN./OSTEO  . PAIN IN JOINT, MULTIPLE SITES  . PALPITATIONS  . DYSPNEA  . Chest pain  . Posterior tibial tendonitis  . PTTD (posterior tibial tendon dysfunction)  . Knee pain, right  . Difficulty in walking  . Weakness of right leg    PT - End of Session Activity Tolerance: Patient tolerated treatment well General Behavior During Session: [facility] for tasks performed Cognition: [facility] for tasks performed  Rachelle Hora, PTA 07/04/2012, 6:16 PM

## 2012-07-06 ENCOUNTER — Ambulatory Visit (HOSPITAL_COMMUNITY)
Admission: RE | Admit: 2012-07-06 | Discharge: 2012-07-06 | Disposition: A | Discharge status: Home / Self Care | Payer: 59 | Source: Ambulatory Visit | Attending: Family Medicine | Admitting: Family Medicine

## 2012-07-06 ENCOUNTER — Ambulatory Visit (HOSPITAL_COMMUNITY)
Admission: RE | Admit: 2012-07-06 | Discharge: 2012-07-06 | Disposition: A | Discharge status: Home / Self Care | Payer: 59 | Source: Ambulatory Visit | Attending: Orthopedic Surgery | Admitting: Orthopedic Surgery

## 2012-07-06 ENCOUNTER — Ambulatory Visit (HOSPITAL_COMMUNITY): Payer: Self-pay | Admitting: *Deleted

## 2012-07-06 DIAGNOSIS — IMO0001 Reserved for inherently not codable concepts without codable children: Secondary | ICD-10-CM | POA: Insufficient documentation

## 2012-07-06 DIAGNOSIS — R262 Difficulty in walking, not elsewhere classified: Secondary | ICD-10-CM | POA: Insufficient documentation

## 2012-07-06 DIAGNOSIS — M25569 Pain in unspecified knee: Secondary | ICD-10-CM | POA: Insufficient documentation

## 2012-07-06 DIAGNOSIS — M6281 Muscle weakness (generalized): Secondary | ICD-10-CM | POA: Insufficient documentation

## 2012-07-06 NOTE — Progress Notes (Signed)
Physical Therapy Treatment Patient Details  Name: MAKALEIGH REINARD MRN: [id number] Date of Birth: 04/03/1966  Today's Date: 07/06/2012 Time: 1348-1440 PT Time Calculation (min): 52 min  Visit#: 4  of 8   Re-eval: 07/30/12 Charges: Therex x 28' Manual x 8' Ice x 10'  Authorization: UMR    Subjective: Symptoms/Limitations Symptoms: My knee has been sore. Pain Assessment Currently in Pain?: Yes Pain Score:   4 Pain Location: Knee Pain Orientation: Right   Exercise/Treatments Standing Heel Raises: 15 reps;Limitations Heel Raises Limitations: Toe raises x 15' Lateral Step Up: Right;Hand Hold: 1;Step Height: 4";15 reps Forward Step Up: Right;Hand Hold: 1;Step Height: 4";15 reps Step Down: Right;Hand Hold: 1;Step Height: 4";15 reps Functional Squat: 10 reps;3 seconds Rocker Board: 2 minutes;Limitations Rocker Board Limitations: R/L   Modalities Modalities: Cryotherapy Manual Therapy Manual Therapy: Edema management Edema Management: Retro massage to R knee with LE elevated x 8'  Cryotherapy Number Minutes Cryotherapy: 10 Minutes Cryotherapy Location: Knee;Ankle (R knee; L ankle) Type of Cryotherapy: Ice pack  Physical Therapy Assessment and Plan PT Assessment and Plan Clinical Impression Statement: Pt presents with decreased pain and swelling in R knee. Pt completes therex well with decreased need for cueing. Retro massage completed and ice applied at end of session to decrease pain and swelling. Pt reports pain decrease to 3/10 at end of session. PT Plan: Continue to progress strength/stability per PT POC.     Problem List Patient Active Problem List  Diagnosis  . HYPERTENSION  . HIATAL HERNIA  . KNEE, ARTHRITIS, DEGEN./OSTEO  . PAIN IN JOINT, MULTIPLE SITES  . PALPITATIONS  . DYSPNEA  . Chest pain  . Posterior tibial tendonitis  . PTTD (posterior tibial tendon dysfunction)  . Knee pain, right  . Difficulty in walking  . Weakness of right leg    PT - End  of Session Activity Tolerance: Patient tolerated treatment well General Behavior During Session: [facility] for tasks performed Cognition: [facility] for tasks performed  Rachelle Hora, PTA 07/06/2012, 5:22 PM

## 2012-07-06 NOTE — Progress Notes (Addendum)
Physical Therapy Treatment (L Ankle) Patient Details  Name: [name] MRN: [id number] Date of Birth: 24-Sep-1966  Today's Date: 07/06/2012 Time: 9242-6834 PT Time Calculation (min): 52 min  Visit#: 4  of 8   Re-eval: 07/30/12 Charges: Therex 25' Manual x 8' Gait x 8'  Authorization: UMR   Subjective: Symptoms/Limitations Symptoms: Pt states that she has been up on her feet all day. Pain Assessment Currently in Pain?: Yes Pain Score:   6 Pain Location: Ankle Pain Orientation: Left   Exercise/Treatments  Ankle Stretches Plantar Fascia Stretch: 3 reps;30 seconds Slant Board Stretch: 3 reps;30 seconds Aerobic Exercises Tread Mill: Gait trainer leg length 91 cm .50 cycle x8' Machines for Strengthening Cybex Leg Press: 2pl 2x10 Ankle Exercises - Standing BAPS: Level 3;Standing;5 reps SLS: L:10" max or 5 Rocker Board: 2 minutes;Limitations Rocker Board Limitations: A/P  Manual Therapy Manual Therapy: Edema management Edema Management: Retro massage to L ankle with LE elevated x 8'   Physical Therapy Assessment and Plan PT Assessment and Plan Clinical Impression Statement: Pt completes therex well with improved control. Began plantar fascia stretch and increased wt on cybex without difficulty. Pt presents with increased swelling in L ankle. Retro massage completed to decrease swelling. Pt reports pain decrease to 5/10 at end of session. PT Plan: Continue to progress strength/stability per PT POC.     Problem List Patient Active Problem List  Diagnosis  . HYPERTENSION  . HIATAL HERNIA  . KNEE, ARTHRITIS, DEGEN./OSTEO  . PAIN IN JOINT, MULTIPLE SITES  . PALPITATIONS  . DYSPNEA  . Chest pain  . Posterior tibial tendonitis  . PTTD (posterior tibial tendon dysfunction)  . Knee pain, right  . Difficulty in walking  . Weakness of right leg    PT - End of Session Activity Tolerance: Patient tolerated treatment well General Behavior During Session: [facility] for  tasks performed Cognition: [facility] for tasks performed  Rachelle Hora, PTA 07/06/2012, 5:16 PM

## 2012-07-07 ENCOUNTER — Ambulatory Visit (HOSPITAL_COMMUNITY): Payer: Self-pay | Admitting: Physical Therapy

## 2012-07-11 ENCOUNTER — Ambulatory Visit: Payer: Self-pay | Admitting: Orthopedic Surgery

## 2012-07-11 ENCOUNTER — Encounter: Payer: Self-pay | Admitting: Orthopedic Surgery

## 2012-07-11 ENCOUNTER — Telehealth: Payer: Self-pay | Admitting: Orthopedic Surgery

## 2012-07-11 ENCOUNTER — Ambulatory Visit (HOSPITAL_COMMUNITY)
Admission: RE | Admit: 2012-07-11 | Discharge: 2012-07-11 | Disposition: A | Discharge status: Home / Self Care | Payer: 59 | Source: Ambulatory Visit | Attending: Orthopedic Surgery | Admitting: Orthopedic Surgery

## 2012-07-11 ENCOUNTER — Ambulatory Visit (HOSPITAL_COMMUNITY): Payer: Self-pay | Admitting: Physical Therapy

## 2012-07-11 ENCOUNTER — Ambulatory Visit (INDEPENDENT_AMBULATORY_CARE_PROVIDER_SITE_OTHER): Payer: 59 | Admitting: Orthopedic Surgery

## 2012-07-11 VITALS — BP 90/60 | Ht 72.0 in | Wt 246.0 lb

## 2012-07-11 DIAGNOSIS — M62838 Other muscle spasm: Secondary | ICD-10-CM

## 2012-07-11 DIAGNOSIS — R262 Difficulty in walking, not elsewhere classified: Secondary | ICD-10-CM

## 2012-07-11 DIAGNOSIS — R29898 Other symptoms and signs involving the musculoskeletal system: Secondary | ICD-10-CM

## 2012-07-11 MED ORDER — CYCLOBENZAPRINE HCL 10 MG PO TABS: 10.0000 mg | ORAL_TABLET | Freq: Three times a day (TID) | ORAL | Status: DC | PRN

## 2012-07-11 NOTE — Progress Notes (Signed)
Patient ID: [name], female   DOB: Apr 12, 1966, 46 y.o.   MRN: [id number] Chief Complaint  Patient presents with  . Follow-up    recheck SARK, DOS 06/17/12    0-100  Some c/o muscle spasms   SLR is good   Add flexeril   Return in  2 weeks

## 2012-07-11 NOTE — Patient Instructions (Addendum)
Continue therapy   Take muscle relaxer as needed   Work status: Per patient request

## 2012-07-11 NOTE — Progress Notes (Signed)
Physical Therapy  Re-Evaluation  Patient Details  Name: ELISSIA SPIEWAK MRN: [id number] Date of Birth: Sep 06, 1966  Today's Date: 07/11/2012 Time: 1300-1342 PT Time Calculation (min): 42 min  Visit#: 5  of 8   Re-eval: 08/10/12    Authorization: UMR   Past Medical History:  Past Medical History  Diagnosis Date  . Chest pain     cath 2005 norm cors, repeat cath Feb 2011 normal cors and only minimally  elevated pulmonary pressures  . Palpitations   . Obesity   . Hiatal hernia   . Sleep apnea sleep study 11/03/2009    mild-no cpap  . GERD (gastroesophageal reflux disease)   . Crohn disease    Past Surgical History:  Past Surgical History  Procedure Date  . Cesarean section   . Abdominal hysterectomy   . Shoulder surgery     right x 2   . Cholecystectomy 09/08/2006    lap. chole.  . Inguinal hernia repair 10/30/2008    right  . Ankle arthroscopy 06/01/2012    Procedure: ANKLE ARTHROSCOPY;  Surgeon: Colin Rhein, MD;  Location: [place];  Service: Orthopedics;  Laterality: Left;  left ankle arthorsocpy with extensive debridement and gastroc slide  . Cardiac catheterization 11/22/2009 and 2005    WNL  . Chondroplasty 06/17/2012    Procedure: CHONDROPLASTY;  Surgeon: Carole Civil, MD;  Location: [facility];  Service: Orthopedics;  Laterality: Right;  right patella    Subjective Symptoms/Limitations Symptoms: Pt states that her knee is getting better. Pain Assessment Currently in Pain?: Yes Pain Location: Knee Pain Orientation: Right   Assessment RLE AROM (degrees) Right Knee Extension: 2  (was 8) Right Knee Flexion: 102  (was 95) RLE Strength Right Hip Flexion: 5/5 Right Hip Extension: 3+/5 (was 3/5) Right Hip ABduction: 5/5 Right Hip ADduction: 4/5 Right Knee Flexion: 4/5 Right Knee Extension:  (4+/5 was 4/5) Right Ankle Dorsiflexion: 5/5 LLE AROM (degrees) Left Ankle Dorsiflexion: 15  (was 10) Left Ankle Plantar Flexion: 45  (was 40) Left  Ankle Inversion: 30  (was 30) Left Ankle Eversion: 15  (was12) LLE Strength Left Ankle Dorsiflexion: 5/5 (was 4/5) Left Ankle Plantar Flexion: 3+/5 (was 3-/5) Left Ankle Inversion: 4/5 Left Ankle Eversion: 4/5 (was 3+/5)  Exercise/Treatments    Stretches Quad Stretch: 3 reps;30 seconds Aerobic Stationary Bike: 8' @ 2.5   Standing Heel Raises: 15 reps Knee Flexion: Strengthening;10 reps;Limitations Knee Flexion Limitations: 3 Lateral Step Up: Right;10 reps;Step Height: 6" Forward Step Up: Right;10 reps Step Down: Right;10 reps;Step Height: 6" Functional Squat: 15 reps Rocker Board: 2 minutes   Prone  Hip Extension: Right;15 reps    Physical Therapy Assessment and Plan PT Assessment and Plan Clinical Impression Statement: Pt improving with ROM and Strength. Visible mm fatigue noted during prolong WB activity. Rehab Potential: Good PT Plan: Continue to stress improving flextion as well as strengthening of Hip mm.  Continue 2 week x 4 wks    Goals Home Exercise Program Pt will Perform Home Exercise Program: Independently PT Short Term Goals PT Short Term Goal 1 - Progress: Met PT Short Term Goal 2 - Progress: Partly met PT Short Term Goal 3 - Progress: Progressing toward goal PT Short Term Goal 4 - Progress: Progressing toward goal PT Long Term Goals PT Long Term Goal 1 - Progress: Not met PT Long Term Goal 2 - Progress: Not met Long Term Goal 3 Progress: Progressing toward goal Long Term Goal 4 Progress: Progressing toward goal  Long Term Goal 5 Progress: Progressing toward goal  Problem List Patient Active Problem List  Diagnosis  . HYPERTENSION  . HIATAL HERNIA  . KNEE, ARTHRITIS, DEGEN./OSTEO  . PAIN IN JOINT, MULTIPLE SITES  . PALPITATIONS  . DYSPNEA  . Chest pain  . Posterior tibial tendonitis  . PTTD (posterior tibial tendon dysfunction)  . Knee pain, right  . Difficulty in walking  . Weakness of right leg    General Behavior During Session: [facility]  for tasks performed Cognition: [facility] for tasks performed  GP    RUSSELL,CINDY 07/11/2012, 2:30 PM  Physician Documentation Your signature is required to indicate approval of the treatment plan as stated above.  Please sign and either send electronically or make a copy of this report for your files and return this physician signed original.   Please mark one 1.__approve of plan  2. ___approve of plan with the following conditions.   ______________________________                                                          _____________________ Physician Signature                                                                                                             Date

## 2012-07-11 NOTE — Telephone Encounter (Signed)
Faxed [employer] form completed by Dr. Aline Brochure with operative notes 06/17/12 and office notes of 06/20/12 and 07/11/12, including authorization to fax [phone], ph [phone].

## 2012-07-11 NOTE — Evaluation (Signed)
Physical Therapy  Re-Evaluation  Patient Details  Name: [name] MRN: [id number] Date of Birth: 1965-11-16  Today's Date: 07/11/2012 Time: 5631-4970 PT Time Calculation (min): 38 min  Visit#: 6  of 8   Re-eval: 07/28/12    Authorization: UMR  Past Medical History:  Past Medical History  Diagnosis Date  . Chest pain     cath 2005 norm cors, repeat cath Feb 2011 normal cors and only minimally  elevated pulmonary pressures  . Palpitations   . Obesity   . Hiatal hernia   . Sleep apnea sleep study 11/03/2009    mild-no cpap  . GERD (gastroesophageal reflux disease)   . Crohn disease    Past Surgical History:  Past Surgical History  Procedure Date  . Cesarean section   . Abdominal hysterectomy   . Shoulder surgery     right x 2   . Cholecystectomy 09/08/2006    lap. chole.  . Inguinal hernia repair 10/30/2008    right  . Ankle arthroscopy 06/01/2012    Procedure: ANKLE ARTHROSCOPY;  Surgeon: Colin Rhein, MD;  Location: [place];  Service: Orthopedics;  Laterality: Left;  left ankle arthorsocpy with extensive debridement and gastroc slide  . Cardiac catheterization 11/22/2009 and 2005    WNL  . Chondroplasty 06/17/2012    Procedure: CHONDROPLASTY;  Surgeon: Carole Civil, MD;  Location: [facility];  Service: Orthopedics;  Laterality: Right;  right patella    Subjective Symptoms/Limitations Symptoms: Pt states that her knee is getting better. Pain Assessment Currently in Pain?: Yes Pain Location: Knee Pain Orientation: Right   Assessment RLE AROM (degrees) Right Knee Extension: 2  (was 8) Right Knee Flexion: 102  (was 95) RLE Strength Right Hip Flexion: 5/5 Right Hip Extension: 3+/5 (was 3/5) Right Hip ABduction: 5/5 Right Hip ADduction: 4/5 Right Knee Flexion: 4/5 Right Knee Extension:  (4+/5 was 4/5) Right Ankle Dorsiflexion: 5/5 LLE AROM (degrees) Left Ankle Dorsiflexion: 15  (was 10) Left Ankle Plantar Flexion: 45  (was 40) Left  Ankle Inversion: 30  (was 30) Left Ankle Eversion: 15  (was12) LLE Strength Left Ankle Dorsiflexion: 5/5 (was 4/5) Left Ankle Plantar Flexion: 3+/5 (was 3-/5) Left Ankle Inversion: 4/5 Left Ankle Eversion: 4/5 (was 3+/5)  Exercise/Treatments     Ankle Stretches Plantar Fascia Stretch: 3 reps;30 seconds Slant Board Stretch: 3 reps;30 seconds Aerobic Exercises Tread Mill: Gait trainer leg length 91 cm .50 cycle x8' Machines for Strengthening Cybex Leg Press:  For ankle PF/DF 2.5 pl 2x10   Ankle Exercises - Standing BAPS: Level 3;Standing;5 reps SLS: L:10" max or 5 Rocker Board: 2 minutes;Limitations Rocker Board Limitations: A/P    Physical Therapy Assessment and Plan PT Assessment and Plan Clinical Impression Statement: Pt improved with ROM and strength.  Need continute strengthening to be able to be up for hours at a time as this is what is needed in pt job. Rehab Potential: Good PT Plan: begin heel walk/toe walk next treatment.     Goals Home Exercise Program Pt will Perform Home Exercise Program: Independently PT Short Term Goals PT Short Term Goal 1 - Progress: Met PT Short Term Goal 2 - Progress: Partly met PT Short Term Goal 3 - Progress: Progressing toward goal PT Short Term Goal 4 - Progress: Progressing toward goal PT Long Term Goals PT Long Term Goal 1 - Progress: Not met PT Long Term Goal 2 - Progress: Not met Long Term Goal 3 Progress: Progressing toward goal Long Term  Goal 4 Progress: Progressing toward goal Long Term Goal 5 Progress: Progressing toward goal  Problem List Patient Active Problem List  Diagnosis  . HYPERTENSION  . HIATAL HERNIA  . KNEE, ARTHRITIS, DEGEN./OSTEO  . PAIN IN JOINT, MULTIPLE SITES  . PALPITATIONS  . DYSPNEA  . Chest pain  . Posterior tibial tendonitis  . PTTD (posterior tibial tendon dysfunction)  . Knee pain, right  . Difficulty in walking  . Weakness of right leg    PT - End of Session Activity Tolerance: Patient  tolerated treatment well General Behavior During Session: [facility] for tasks performed Cognition: [facility] for tasks performed PT Plan of Care PT Home Exercise Plan: given  GP    Lynnmarie Lovett,CINDY 07/11/2012, 2:35 PM  Physician Documentation Your signature is required to indicate approval of the treatment plan as stated above.  Please sign and either send electronically or make a copy of this report for your files and return this physician signed original.   Please mark one 1.__approve of plan  2. ___approve of plan with the following conditions.   ______________________________                                                          _____________________ Physician Signature                                                                                                             Date

## 2012-07-13 ENCOUNTER — Ambulatory Visit (HOSPITAL_COMMUNITY)
Admission: RE | Admit: 2012-07-13 | Discharge: 2012-07-13 | Disposition: A | Discharge status: Home / Self Care | Payer: 59 | Source: Ambulatory Visit | Attending: Orthopedic Surgery | Admitting: Orthopedic Surgery

## 2012-07-13 ENCOUNTER — Ambulatory Visit (HOSPITAL_COMMUNITY): Payer: Self-pay | Admitting: *Deleted

## 2012-07-13 NOTE — Progress Notes (Signed)
Physical Therapy Treatment Patient Details  Name: [name] MRN: [id number] Date of Birth: 08/16/1966  Today's Date: 07/13/2012 Time: 5697-9480 PT Time Calculation (min): 34 min  Visit#: 6  of 8   Re-eval: 07/28/12 Charges: Therex x 30'  Authorization: UMR   Subjective: Symptoms/Limitations Symptoms: Pt states that her ankle feel good. No pain. Pain Assessment Currently in Pain?: No/denies Pain Score: 0-No pain   Exercise/Treatments Machines for Strengthening Cybex Leg Press: 2.5pl 2x10 Ankle Exercises - Standing BAPS: Level 3;Standing;5 reps Rocker Board: 2 minutes;Limitations Rocker Board Limitations: A/P Heel Walk (Round Trip): B heel raise L only coming down x 10  Other Standing Ankle Exercises: Heel walk/toe walk 2RT  Physical Therapy Assessment and Plan PT Assessment and Plan Clinical Impression Statement: Began heel and toe walking. Pt has more difficulty with heel walking secondary to gastroc weakness. Pt educated on scar massage and focusing on eccentric post tibialis control with tband at home. Pt completes BAPS and rocker board with improved control. Pt reports no change in pain at end of session. PT Plan: Continue to progress ankle strength/stability per PT POC. Increase focus on eccentric strengthening for posterior tibialis.     Problem List Patient Active Problem List  Diagnosis  . HYPERTENSION  . HIATAL HERNIA  . KNEE, ARTHRITIS, DEGEN./OSTEO  . PAIN IN JOINT, MULTIPLE SITES  . PALPITATIONS  . DYSPNEA  . Chest pain  . Posterior tibial tendonitis  . PTTD (posterior tibial tendon dysfunction)  . Knee pain, right  . Difficulty in walking  . Weakness of right leg    PT - End of Session Activity Tolerance: Patient tolerated treatment well General Behavior During Session: [facility] for tasks performed Cognition: [facility] for tasks performed  Rachelle Hora, PTA 07/13/2012, 1:40 PM

## 2012-07-13 NOTE — Progress Notes (Signed)
Physical Therapy Treatment Patient Details  Name: [name] MRN: [id number] Date of Birth: 08/19/1966  Today's Date: 07/13/2012 Time: 1339-1410 PT Time Calculation (min): 31 min  Visit#: 6  of 8   Re-eval: 07/28/12 Charges: Therex x 20' Manual w/ice x 10'  Authorization: UMR    Subjective: Symptoms/Limitations Symptoms: Pt states the swelling in her knee is decreasing.  Pain Assessment Currently in Pain?: Yes Pain Score:   2 Pain Location: Knee Pain Orientation: Right   Exercise/Treatments Standing Lateral Step Up: Right;10 reps;Step Height: 6" Forward Step Up: Right;10 reps;Step Height: 6" Step Down: Right;10 reps;Step Height: 6" Functional Squat: 15 reps Rocker Board: 2 minutes Rocker Board Limitations: R/L Supine Knee Flexion: PROM   Modalities Modalities: Cryotherapy Manual Therapy Manual Therapy: Edema management Edema Management: Retro massage to R knee with LE elevated x 10'  Cryotherapy Number Minutes Cryotherapy: 10 Minutes Cryotherapy Location: Ankle (While massage was completed to R knee) Type of Cryotherapy: Ice pack  Physical Therapy Assessment and Plan PT Assessment and Plan Clinical Impression Statement: Pt displays improved quad power and eccentric control with step ups. Pt requires vc's and demo for proper form with functional squats. Retro massage completed to L knee  to decrease swelling. Pt reports no change in pain at end of session. PT Plan: Continue to progress strength/ROM per PT POC.    Problem List Patient Active Problem List  Diagnosis  . HYPERTENSION  . HIATAL HERNIA  . KNEE, ARTHRITIS, DEGEN./OSTEO  . PAIN IN JOINT, MULTIPLE SITES  . PALPITATIONS  . DYSPNEA  . Chest pain  . Posterior tibial tendonitis  . PTTD (posterior tibial tendon dysfunction)  . Knee pain, right  . Difficulty in walking  . Weakness of right leg    PT - End of Session Activity Tolerance: Patient tolerated treatment well General Behavior  During Session: [facility] for tasks performed Cognition: [facility] for tasks performed  Rachelle Hora, PTA 07/13/2012, 2:32 PM

## 2012-07-15 ENCOUNTER — Ambulatory Visit (HOSPITAL_COMMUNITY): Payer: Self-pay | Admitting: Physical Therapy

## 2012-07-18 ENCOUNTER — Ambulatory Visit (HOSPITAL_COMMUNITY): Payer: Self-pay | Admitting: *Deleted

## 2012-07-18 ENCOUNTER — Ambulatory Visit (HOSPITAL_COMMUNITY)
Admission: RE | Admit: 2012-07-18 | Discharge: 2012-07-18 | Disposition: A | Discharge status: Home / Self Care | Payer: 59 | Source: Ambulatory Visit | Attending: Orthopedic Surgery | Admitting: Orthopedic Surgery

## 2012-07-18 DIAGNOSIS — R29898 Other symptoms and signs involving the musculoskeletal system: Secondary | ICD-10-CM

## 2012-07-18 NOTE — Progress Notes (Signed)
Physical Therapy Treatment Patient Details  Name: [name] MRN: [id number] Date of Birth: [birth year]  Today's Date: 07/18/2012 Time: 1105-1203 PT Time Calculation (min): 58 min  Visit#: 7  of 8   Re-eval: 07/28/12 Assessment Diagnosis: S/P R artroscopic surgery  Surgical Date: 06/17/12 Next MD Visit: 07/25/2012  Authorization: Jacqlyn Krauss  Authorization Time Period:    Authorization Visit#:   of     Subjective: Symptoms/Limitations Symptoms: Session focus on knee today, pt stated ankle is feeling better.  Stated compliance with HEP.  Pain free today. Pain Assessment Currently in Pain?: No/denies  Objective:  AROM: 0-105 PROM 109 flexion  Exercise/Treatments Standing Lateral Step Up: Right;15 reps;Hand Hold: 0;Step Height: 6" Forward Step Up: Right;15 reps;Hand Hold: 0;Step Height: 6" Step Down: Right;15 reps;Hand Hold: 0;Step Height: 6" Functional Squat: 15 reps;Limitations Functional Squat Limitations: proper lifting with multimodal cueing for proper form Rocker Board: 2 minutes Rocker Board Limitations: R/L Supine Heel Slides: 10 reps;Limitations Heel Slides Limitations: AROM 0-102 Patellar Mobs: Complete R/L, S/I Knee Flexion: PROM;3 sets    Physical Therapy Assessment and Plan PT Assessment and Plan Clinical Impression Statement: Pt continues to require multimodal cueing for proper form with functional squats.  A/PROM improving, pt able to achieve 105 degrees flexion actively following heel slides, 109 degrees passive.  Retro massage complete with noted decreased edema at end of session.  No reports of pain through session. PT Plan: Reassess next session prior MD apt.  Next session to include L ankle and R knee therex and manual as needed.    Goals    Problem List Patient Active Problem List  Diagnosis  . HYPERTENSION  . HIATAL HERNIA  . KNEE, ARTHRITIS, DEGEN./OSTEO  . PAIN IN JOINT, MULTIPLE SITES  . PALPITATIONS  . DYSPNEA  . Chest pain  .  Posterior tibial tendonitis  . PTTD (posterior tibial tendon dysfunction)  . Knee pain, right  . Difficulty in walking  . Weakness of right leg    PT - End of Session Activity Tolerance: Patient tolerated treatment well General Behavior During Session: [facility] for tasks performed Cognition: [facility] for tasks performed  GP    Aldona Lento 07/18/2012, 12:02 PM

## 2012-07-20 ENCOUNTER — Ambulatory Visit (HOSPITAL_COMMUNITY): Payer: Self-pay | Admitting: *Deleted

## 2012-07-25 ENCOUNTER — Ambulatory Visit: Payer: 59 | Admitting: Orthopedic Surgery

## 2012-07-25 ENCOUNTER — Ambulatory Visit (INDEPENDENT_AMBULATORY_CARE_PROVIDER_SITE_OTHER): Payer: 59 | Admitting: Orthopedic Surgery

## 2012-07-25 ENCOUNTER — Encounter: Payer: Self-pay | Admitting: Orthopedic Surgery

## 2012-07-25 VITALS — BP 106/72 | Ht 72.0 in | Wt 246.0 lb

## 2012-07-25 DIAGNOSIS — Z9889 Other specified postprocedural states: Secondary | ICD-10-CM

## 2012-07-25 NOTE — Patient Instructions (Addendum)
Ok to RTW 28th Oct   Apply ice to the knee as needed if swelling occurs

## 2012-07-25 NOTE — Progress Notes (Signed)
Patient ID: [name], female   DOB: 1966/01/02, 46 y.o.   MRN: [id number] Chief Complaint  Patient presents with  . Follow-up    2 week recheck on right knee. 06/17/2012 arthroscopy right knee      She says she is ready to go back to work. She has full extension of her knee has a little trouble with full flexion mild swelling in the joint overall her knee looks much improved recommend returning to work on October 28 she can continue her therapy visits until her appointments run out  Followup as needed

## 2012-07-26 ENCOUNTER — Ambulatory Visit (HOSPITAL_COMMUNITY)
Admission: RE | Admit: 2012-07-26 | Discharge: 2012-07-26 | Disposition: A | Discharge status: Home / Self Care | Payer: 59 | Source: Ambulatory Visit | Attending: Physical Therapy | Admitting: Physical Therapy

## 2012-07-26 NOTE — Progress Notes (Addendum)
Physical Therapy Re-evaluation/ Discharge  Name: [name] MRN: [id number] Date of Birth: [birth year]  Today's Date: 07/26/2012 Time: 1310-1335 PT Time Calculation (min): 25 min  Visit#: 8  of 8   Diagnosis: S/P R knee scope MD:  Dr. Aline Brochure Surgical Date: 06/17/12 Next MD Visit: 07/25/2012 Authorization: Jacqlyn Krauss   Subjective: Symptoms/Limitations Symptoms: Pt. states her knee is bothering her only a little when she walks, 1/10. Pain Assessment Currently in Pain?: Yes Pain Score:   1 Pain Location: Knee Pain Orientation: Right  Objective: RLE AROM (degrees) Right Knee Extension: 0  Right Knee Flexion: 110  (was 105 degrees)  RLE PROM (degrees) Right Knee Flexion: 120  (was 109 degrees)  RLE Strength Right Hip Flexion: 5/5 (was 5/5) Right Hip Extension: 5/5 (was 3+/5) Right Hip ABduction: 5/5 (was 5/5) Right Hip ADduction: 5/5 (was 4/5) Right Knee Flexion: 5/5 (was 4/5) Right Knee Extension:  4+/5 (was 4+/5))   Physical Therapy Assessment and Plan PT Assessment and Plan Clinical Impression Statement: Pt. has met all STG's/LTG's.  Pt. is performing her HEP independently.  Pt. is not having any functional difficulties; states she gets a little discomfort if walking alot  and stiffness after sitting a while.  Able to negotiate steps without diffiuculty.  MD released pt. and is able to return to work next week 08/01/12. PT Plan: Discharge to HEP as has met all goals and is returning to work.    Goals Home Exercise Program Pt will Perform Home Exercise Program: Independently PT Goal: Perform Home Exercise Program - Progress: Met  PT Short Term Goals PT Short Term Goal 1: Pt to be walking without a crutch PT Short Term Goal 1 - Progress: Met PT Short Term Goal 2: Pt to states she is going up and down her steps. PT Short Term Goal 2 - Progress: Met PT Short Term Goal 3: Pt pain to be no greater than a  5 PT Short Term Goal 3 - Progress: Met PT Short Term Goal 4:  ROM to be 0 to 110 for more normalized walking and comfort of sitting. PT Short Term Goal 4 - Progress: Met  PT Long Term Goals Time to Complete Long Term Goals:  (3 weeks) PT Long Term Goal 1: Pain to be no greater than a 2 80 % of the day PT Long Term Goal 1 - Progress: Met PT Long Term Goal 2: Pt to have normal strength . PT Long Term Goal 2 - Progress: Met Long Term Goal 3: Pt to be I in advance HEP Long Term Goal 3 Progress: Met Long Term Goal 4: Pt to be able to walk for 60 minutes without increased pain. Long Term Goal 4 Progress: Met PT Long Term Goal 5: Pt to be able to sit comfortably for 90 minutes to watch a movie or to travel. Long Term Goal 5 Progress: Met (a little stiffness when initially gets up) PT Long Term Goal 6: normalize gait Long Term Goal 6 Progress: Met  Problem List Patient Active Problem List  Diagnosis  . HYPERTENSION  . HIATAL HERNIA  . KNEE, ARTHRITIS, DEGEN./OSTEO  . PAIN IN JOINT, MULTIPLE SITES  . PALPITATIONS  . DYSPNEA  . Chest pain  . Posterior tibial tendonitis  . PTTD (posterior tibial tendon dysfunction)  . Knee pain, right  . Difficulty in walking  . Weakness of right leg  . History of arthroscopy of right knee    PT - End of Session Activity Tolerance: Patient tolerated  treatment well General Behavior During Session: [facility] for tasks performed Cognition: [facility] for tasks performed   Teena Irani, PTA/CLT 07/26/2012, 1:33 PM

## 2012-07-26 NOTE — Progress Notes (Addendum)
Physical Therapy Re-evaluation / Discharge  Patient Details  Name: [name] MRN: [id number] Date of Birth: [birth year]  Today's Date: 07/26/2012 Time: 1330-1350 PT Time Calculation (min): 20 min  Visit#: 8  of 8   Diagnosis: S/P L gastroc slide  MD:  Mr. Beola Cord Surgical Date: 06/01/12 Authorization: Jacqlyn Krauss    Subjective Symptoms/Limitations Symptoms: Pt. states her ankle only bothers her after walking a long period of time.  Mostly is painfree. Pain Assessment Currently in Pain?: No/denies Pain Score:   1 Pain Location: Knee Pain Orientation: Right  Objective: LLE AROM (degrees) Left Ankle Dorsiflexion: 15  (was 15) Left Ankle Plantar Flexion: 45  (was 45) Left Ankle Inversion: 25  (was 15) Left Ankle Eversion: 35  (was 30)  LLE Strength Left Ankle Dorsiflexion: 5/5 (was 5/5) Left Ankle Plantar Flexion: 5/5 (was 3+/5) Left Ankle Inversion: 5/5 (was 4/5) Left Ankle Eversion: 5/5 (was 4/5)   Physical Therapy Assessment and Plan PT Assessment and Plan Clinical Impression Statement: Pt. has met all STG's/LTG's. Pt. is performing her HEP independently. Pt. is not having any functional difficulties; states she gets a little discomfort if walking alot.  Able to negotiate steps without diffiuculty. MD released pt. and is able to return to work next week 08/01/12 PT Plan: Discharge to HEP as has met all goals and is returning to work    Goals Home Exercise Program Pt will Perform Home Exercise Program: Independently PT Goal: Perform Home Exercise Program - Progress: Met  PT Short Term Goals Time to Complete Short Term Goals: 2 weeks PT Short Term Goal 1: Pt to be walking without brace at all times. PT Short Term Goal 1 - Progress: Met PT Short Term Goal 2: Pt  SLS to be 15 PT Short Term Goal 2 - Progress: Met PT Short Term Goal 3: Pt pain to be no greater than a  5 PT Short Term Goal 3 - Progress: Met  PT Long Term Goals Time to Complete Long Term Goals: 4  weeks PT Long Term Goal 1: Pt to be able to walk for 60 minutes without difficulty  PT Long Term Goal 1 - Progress: Met PT Long Term Goal 2: Pt to have normal strength. PT Long Term Goal 2 - Progress: Met Long Term Goal 3: Pt to be I in advance HEP. Long Term Goal 3 Progress: Met Long Term Goal 4: Pt to be able to walk for 60 minutes without increased pain. Long Term Goal 4 Progress: Met  Problem List Patient Active Problem List  Diagnosis  . HYPERTENSION  . HIATAL HERNIA  . KNEE, ARTHRITIS, DEGEN./OSTEO  . PAIN IN JOINT, MULTIPLE SITES  . PALPITATIONS  . DYSPNEA  . Chest pain  . Posterior tibial tendonitis  . PTTD (posterior tibial tendon dysfunction)  . Knee pain, right  . Difficulty in walking  . Weakness of right leg  . History of arthroscopy of right knee    PT - End of Session Activity Tolerance: Patient tolerated treatment well General Behavior During Session: [facility] for tasks performed Cognition: [facility] for tasks performed    Teena Irani, PTA/CLT 07/26/2012, 1:51 PM

## 2012-07-27 ENCOUNTER — Ambulatory Visit (HOSPITAL_COMMUNITY): Payer: Self-pay | Admitting: Physical Therapy

## 2012-07-28 ENCOUNTER — Ambulatory Visit (HOSPITAL_COMMUNITY): Payer: Self-pay | Admitting: *Deleted

## 2012-07-28 ENCOUNTER — Telehealth: Payer: Self-pay | Admitting: Orthopedic Surgery

## 2012-07-28 NOTE — Telephone Encounter (Signed)
Faxed medical records (notes, 07/25/12)) to [place] as per request; authorization on file.  Fax # [phone], ph [phone].

## 2012-08-01 ENCOUNTER — Ambulatory Visit (HOSPITAL_COMMUNITY): Payer: Self-pay | Admitting: *Deleted

## 2012-08-03 ENCOUNTER — Ambulatory Visit (HOSPITAL_COMMUNITY): Payer: Self-pay | Admitting: Physical Therapy

## 2013-08-08 ENCOUNTER — Emergency Department (HOSPITAL_COMMUNITY): Payer: 59

## 2013-08-08 ENCOUNTER — Emergency Department (HOSPITAL_COMMUNITY)
Admission: EM | Admit: 2013-08-08 | Discharge: 2013-08-08 | Disposition: A | Discharge status: Home / Self Care | Payer: 59 | Attending: Emergency Medicine | Admitting: Emergency Medicine

## 2013-08-08 ENCOUNTER — Encounter (HOSPITAL_COMMUNITY): Payer: Self-pay | Admitting: Emergency Medicine

## 2013-08-08 DIAGNOSIS — Z792 Long term (current) use of antibiotics: Secondary | ICD-10-CM | POA: Insufficient documentation

## 2013-08-08 DIAGNOSIS — IMO0002 Reserved for concepts with insufficient information to code with codable children: Secondary | ICD-10-CM | POA: Insufficient documentation

## 2013-08-08 DIAGNOSIS — K5900 Constipation, unspecified: Secondary | ICD-10-CM | POA: Insufficient documentation

## 2013-08-08 DIAGNOSIS — Z90710 Acquired absence of both cervix and uterus: Secondary | ICD-10-CM | POA: Insufficient documentation

## 2013-08-08 DIAGNOSIS — R142 Eructation: Secondary | ICD-10-CM | POA: Insufficient documentation

## 2013-08-08 DIAGNOSIS — Z9089 Acquired absence of other organs: Secondary | ICD-10-CM | POA: Insufficient documentation

## 2013-08-08 DIAGNOSIS — Z9889 Other specified postprocedural states: Secondary | ICD-10-CM | POA: Insufficient documentation

## 2013-08-08 DIAGNOSIS — K5792 Diverticulitis of intestine, part unspecified, without perforation or abscess without bleeding: Secondary | ICD-10-CM

## 2013-08-08 DIAGNOSIS — R509 Fever, unspecified: Secondary | ICD-10-CM | POA: Insufficient documentation

## 2013-08-08 DIAGNOSIS — K219 Gastro-esophageal reflux disease without esophagitis: Secondary | ICD-10-CM | POA: Insufficient documentation

## 2013-08-08 DIAGNOSIS — R079 Chest pain, unspecified: Secondary | ICD-10-CM | POA: Insufficient documentation

## 2013-08-08 DIAGNOSIS — E669 Obesity, unspecified: Secondary | ICD-10-CM | POA: Insufficient documentation

## 2013-08-08 DIAGNOSIS — R141 Gas pain: Secondary | ICD-10-CM | POA: Insufficient documentation

## 2013-08-08 DIAGNOSIS — R0602 Shortness of breath: Secondary | ICD-10-CM | POA: Insufficient documentation

## 2013-08-08 DIAGNOSIS — Z9861 Coronary angioplasty status: Secondary | ICD-10-CM | POA: Insufficient documentation

## 2013-08-08 DIAGNOSIS — K5090 Crohn's disease, unspecified, without complications: Secondary | ICD-10-CM | POA: Insufficient documentation

## 2013-08-08 DIAGNOSIS — Z8669 Personal history of other diseases of the nervous system and sense organs: Secondary | ICD-10-CM | POA: Insufficient documentation

## 2013-08-08 DIAGNOSIS — Z79899 Other long term (current) drug therapy: Secondary | ICD-10-CM | POA: Insufficient documentation

## 2013-08-08 DIAGNOSIS — K5732 Diverticulitis of large intestine without perforation or abscess without bleeding: Secondary | ICD-10-CM | POA: Insufficient documentation

## 2013-08-08 LAB — TROPONIN I: Troponin I: <0.3 ng/mL (ref <0.30)

## 2013-08-08 LAB — CBC WITH DIFFERENTIAL/PLATELET
Basophils Absolute: 0 10*3/uL (ref 0.0–0.1)
Basophils Relative: 0 % (ref 0–1)
Eosinophils Absolute: 0.1 10*3/uL (ref 0.0–0.7)
Eosinophils Relative: 3 % (ref 0–5)
HCT: 37.6 % (ref 36.0–46.0)
Hemoglobin: 12.4 g/dL (ref 12.0–15.0)
MCH: 28.2 pg (ref 26.0–34.0)
MCHC: 33 g/dL (ref 30.0–36.0)
MCV: 85.6 fL (ref 78.0–100.0)
Monocytes Absolute: 0.3 10*3/uL (ref 0.1–1.0)
Monocytes Relative: 6 % (ref 3–12)
Neutro Abs: 3.1 10*3/uL (ref 1.7–7.7)
RDW: 14.3 % (ref 11.5–15.5)

## 2013-08-08 LAB — COMPREHENSIVE METABOLIC PANEL
AST: 15 U/L (ref 0–37)
Albumin: 3.7 g/dL (ref 3.5–5.2)
BUN: 9 mg/dL (ref 6–23)
Calcium: 9.5 mg/dL (ref 8.4–10.5)
Creatinine, Ser: 0.82 mg/dL (ref 0.50–1.10)
Total Bilirubin: 0.6 mg/dL (ref 0.3–1.2)
Total Protein: 7.2 g/dL (ref 6.0–8.3)

## 2013-08-08 LAB — LACTIC ACID, PLASMA: Lactic Acid, Venous: 0.8 mmol/L (ref 0.5–2.2)

## 2013-08-08 LAB — LIPASE, BLOOD: Lipase: 20 U/L (ref 11–59)

## 2013-08-08 MED ORDER — BARIUM SULFATE 2 % PO SUSP
450.0000 mL | Freq: Once | ORAL | Status: DC
  Filled 2013-08-08: qty 450

## 2013-08-08 MED ORDER — OXYCODONE-ACETAMINOPHEN 5-325 MG PO TABS
2.0000 | ORAL_TABLET | Freq: Once | ORAL | Status: AC
Start: 2013-08-08 — End: 2013-08-08
  Administered 2013-08-08: 2 via ORAL
  Filled 2013-08-08: qty 2

## 2013-08-08 MED ORDER — ONDANSETRON HCL 4 MG PO TABS: 4.0000 mg | ORAL_TABLET | Freq: Four times a day (QID) | ORAL | Status: DC

## 2013-08-08 MED ORDER — CIPROFLOXACIN IN D5W 400 MG/200ML IV SOLN
400.0000 mg | Freq: Once | INTRAVENOUS | Status: AC
  Administered 2013-08-08: 400 mg via INTRAVENOUS
  Filled 2013-08-08: qty 200

## 2013-08-08 MED ORDER — METRONIDAZOLE 500 MG PO TABS: 500.0000 mg | ORAL_TABLET | Freq: Two times a day (BID) | ORAL | Status: DC

## 2013-08-08 MED ORDER — ONDANSETRON HCL 4 MG/2ML IJ SOLN
4.0000 mg | Freq: Once | INTRAMUSCULAR | Status: AC
  Administered 2013-08-08: 4 mg via INTRAVENOUS
  Filled 2013-08-08: qty 2

## 2013-08-08 MED ORDER — SODIUM CHLORIDE 0.9 % IV BOLUS (SEPSIS)
1000.0000 mL | Freq: Once | INTRAVENOUS | Status: AC
  Administered 2013-08-08: 1000 mL via INTRAVENOUS

## 2013-08-08 MED ORDER — CIPROFLOXACIN HCL 500 MG PO TABS: 500.0000 mg | ORAL_TABLET | Freq: Two times a day (BID) | ORAL | Status: DC

## 2013-08-08 MED ORDER — OXYCODONE-ACETAMINOPHEN 5-325 MG PO TABS: 2.0000 | ORAL_TABLET | ORAL | Status: DC | PRN

## 2013-08-08 MED ORDER — MORPHINE SULFATE 4 MG/ML IJ SOLN
4.0000 mg | Freq: Once | INTRAMUSCULAR | Status: AC
Start: 2013-08-08 — End: 2013-08-08
  Administered 2013-08-08: 4 mg via INTRAVENOUS
  Filled 2013-08-08: qty 1

## 2013-08-08 MED ORDER — MORPHINE SULFATE 4 MG/ML IJ SOLN
4.0000 mg | Freq: Once | INTRAMUSCULAR | Status: AC
  Administered 2013-08-08: 4 mg via INTRAVENOUS
  Filled 2013-08-08: qty 1

## 2013-08-08 MED ORDER — METRONIDAZOLE IN NACL 5-0.79 MG/ML-% IV SOLN
500.0000 mg | Freq: Once | INTRAVENOUS | Status: AC
  Administered 2013-08-08: 500 mg via INTRAVENOUS
  Filled 2013-08-08: qty 100

## 2013-08-08 NOTE — ED Notes (Signed)
Pt states lower abdominal pain (descirbed as "pulling" pain), chest tightness, and sob over past several days. Also states abdominal distention. Previous pain down left arm on Saturday. Hx of chrons. Last BM was yesterday.

## 2013-08-08 NOTE — ED Notes (Signed)
Pt alert & oriented x4, stable gait. Patient given discharge instructions, paperwork & prescription(s). Patient  instructed to stop at the registration desk to finish any additional paperwork. Patient verbalized understanding. Pt left department w/ no further questions. 

## 2013-08-08 NOTE — ED Notes (Signed)
Pt will be ready for scan in 2hrs

## 2013-08-08 NOTE — ED Provider Notes (Signed)
CSN: [id number]     Arrival date & time 08/08/13  7209 History   This chart was scribed for [name] Essex, MD, by Neta Ehlers, ED Scribe. This patient was seen in room APA02/[facility]02 and the patient's care was started at 10:13 AM.  First MD Initiated Contact with Patient 08/08/13 1007     Chief Complaint  Patient presents with  . Abdominal Pain  . Chest Pain  . Shortness of Breath    The history is provided by the patient. No language interpreter was used.   HPI Comments: [name] is a 47 y.o. female, with a h/o Crohn disease, who presents to the Emergency Department complaining of epigastric abdominal pain which began yesterday and which radiates down to her lower quadrants upon palpation. She had experienced constipation and bloating for four days prior to the abdominal pain. Her constipation resolved yesterday; she states she had two BMs yesterday.  Following the resolution of constipation, her abdominal pain increased. Yesterday, she also experienced chest tightness which lasted a few minutes and intermittent SOB which was worsened with a supine position; the chest pain has since resolved. The pt had a catheterization two years ago, and it was normal. She has also experienced nausea, a decreased appetite, chills, "pressure" to her back, and a low grade fever. In the ED her temperature is 98.6 F. She denies emesis, dysuria, hematuria, or hematochezia. She is still able to have flatulence. The pt has a h/o of abdominal surgeries including total hysterectomy, cholecystectomy, and a hernia repair. She denies a h/o blood clots. She denies taking any medication for Crohn's. She states she is allergic to IV contrast and to dilaudid. The pt is a non-smoker.   Past Medical History  Diagnosis Date  . Chest pain     cath 2005 norm cors, repeat cath Feb 2011 normal cors and only minimally  elevated pulmonary pressures  . Palpitations   . Obesity   . Hiatal hernia   . Sleep apnea sleep study  11/03/2009    mild-no cpap  . GERD (gastroesophageal reflux disease)   . Crohn disease    Past Surgical History  Procedure Laterality Date  . Cesarean section    . Abdominal hysterectomy    . Shoulder surgery      right x 2   . Cholecystectomy  09/08/2006    lap. chole.  . Inguinal hernia repair  10/30/2008    right  . Ankle arthroscopy  06/01/2012    Procedure: ANKLE ARTHROSCOPY;  Surgeon: Colin Rhein, MD;  Location: [place];  Service: Orthopedics;  Laterality: Left;  left ankle arthorsocpy with extensive debridement and gastroc slide  . Cardiac catheterization  11/22/2009 and 2005    WNL  . Chondroplasty  06/17/2012    Procedure: CHONDROPLASTY;  Surgeon: Carole Civil, MD;  Location: [facility];  Service: Orthopedics;  Laterality: Right;  right patella   Family History  Problem Relation Age of Onset  . Hypertension Mother   . Lupus Father   . COPD Father    History  Substance Use Topics  . Smoking status: Never Smoker   . Smokeless tobacco: Not on file  . Alcohol Use: No   No OB history provided.  Review of Systems  Constitutional: Positive for fever and appetite change.  Respiratory: Positive for shortness of breath.   Cardiovascular: Positive for chest pain.  Gastrointestinal: Positive for nausea, abdominal pain and constipation. Negative for vomiting, diarrhea and blood in stool.  All other systems reviewed and are negative.    Allergies  Other; Wasp venom; Hydromorphone hcl; Omnipaque; Pollen extract; and Adhesive  Home Medications   Current Outpatient Rx  Name  Route  Sig  Dispense  Refill  . cyclobenzaprine (FLEXERIL) 10 MG tablet   Oral   Take 1 tablet (10 mg total) by mouth 3 (three) times daily as needed. For muscle spasms   40 tablet   2   . esomeprazole (NEXIUM) 40 MG capsule   Oral   Take 40 mg by mouth daily before breakfast.           . acetaminophen (TYLENOL) 500 MG tablet   Oral   Take 1,500 mg by mouth daily as  needed for mild pain.         Marland Kitchen albuterol (PROVENTIL) (2.5 MG/3ML) 0.083% nebulizer solution   Nebulization   Take 2.5 mg by nebulization every 6 (six) hours as needed. For shortness of breath         . budesonide (ENTOCORT EC) 3 MG 24 hr capsule   Oral   Take 3 mg by mouth daily as needed. Crohn's Disease Flare-Up         . cetirizine (ZYRTEC) 10 MG tablet   Oral   Take 10 mg by mouth daily as needed. For Allergies         . ciprofloxacin (CIPRO) 500 MG tablet   Oral   Take 1 tablet (500 mg total) by mouth every 12 (twelve) hours.   20 tablet   0   . hyoscyamine (ANASPAZ) 0.125 MG TBDP   Sublingual   Place 0.125 mg under the tongue 4 (four) times daily as needed. For GI         . metroNIDAZOLE (FLAGYL) 500 MG tablet   Oral   Take 1 tablet (500 mg total) by mouth 2 (two) times daily.   20 tablet   0   . ondansetron (ZOFRAN) 4 MG tablet   Oral   Take 1 tablet (4 mg total) by mouth every 6 (six) hours.   12 tablet   0   . oxyCODONE-acetaminophen (PERCOCET/ROXICET) 5-325 MG per tablet   Oral   Take 2 tablets by mouth every 4 (four) hours as needed for severe pain.   15 tablet   0    Triage Vitals: BP 149/96  Pulse 80  Temp(Src) 98.6 F (37 C) (Oral)  Resp 16  Ht 6' (1.829 m)  Wt 250 lb (113.399 kg)  BMI 33.90 kg/m2  SpO2 100%  Physical Exam  Nursing note and vitals reviewed. Constitutional: She is oriented to person, place, and time. She appears well-developed and well-nourished. No distress.  HENT:  Head: Normocephalic and atraumatic.  Eyes: Conjunctivae and EOM are normal. Pupils are equal, round, and reactive to light.  Neck: Normal range of motion. Neck supple. No tracheal deviation present.  Cardiovascular: Normal rate, regular rhythm and normal heart sounds.   No murmur heard. Pulmonary/Chest: Effort normal. No respiratory distress. She has no wheezes. She has no rales.  Abdominal: Soft. There is tenderness. There is guarding.  Diffuse  abdominal tenderness, worse in the left lower quadrant. Voluntary guarding.   Musculoskeletal: Normal range of motion. She exhibits tenderness.  5/5 strength in bilateral lower extremities. Ankle plantar and dorsiflexion intact. Great toe extension intact bilaterally. +2 DP and PT pulses. +2 patellar reflexes bilaterally. Normal gait.lumbar Paraspinal tenderness.   Neurological: She is alert and oriented to person, place, and time. She  has normal reflexes. She displays normal reflexes. No cranial nerve deficit. She exhibits normal muscle tone. Coordination normal.  Skin: Skin is warm and dry.  Psychiatric: She has a normal mood and affect. Her behavior is normal.    ED Course  Procedures (including critical care time)  DIAGNOSTIC STUDIES: Oxygen Saturation is 100% on room air, normal by my interpretation.    COORDINATION OF CARE:  10:51AM- Discussed treatment plan with patient, and the patient agreed to the plan.   Labs Review Labs Reviewed  COMPREHENSIVE METABOLIC PANEL - Abnormal; Notable for the following:    Potassium 3.2 (*)    Glucose, Bld 118 (*)    GFR calc non Af Amer 85 (*)    All other components within normal limits  CBC WITH DIFFERENTIAL  LIPASE, BLOOD  LACTIC ACID, PLASMA  TROPONIN I  D-DIMER, QUANTITATIVE   Imaging Review Ct Abdomen Pelvis Wo Contrast  08/08/2013   CLINICAL DATA:  Left lower quadrant pain  EXAM: CT ABDOMEN AND PELVIS WITHOUT CONTRAST  TECHNIQUE: Multidetector CT imaging of the abdomen and pelvis was performed following the standard protocol without intravenous contrast.  COMPARISON:  01/02/2011  FINDINGS: Lung bases are unremarkable. Sagittal images of the spine shows significant disc space flattening at L5-S1 level.  Unenhanced liver shows no biliary ductal dilatation. Status postcholecystectomy. Unenhanced pancreas, spleen and adrenal glands are unremarkable. Unenhanced kidneys are symmetrical in size. No nephrolithiasis. No hydronephrosis or  hydroureter. Bilateral no calcified ureteral calculi.  Oral contrast material was given to the patient. No small bowel obstruction. No ascites or free air. No adenopathy. There is no pericecal inflammation. The terminal ileum is unremarkable. Normal appendix partially visualized in axial image 66.  Few diverticula are noted in left colon. Sigmoid colon diverticula are noted. In axial image 64 there is mild thickening of diverticular colonic wall in left lower quadrant and mild stranding of pericolonic fat. This is confirmed on sagittal image 90. Findings are consistent with mild diverticulitis. No diverticular abscess is noted. Tiny amount of pericolonic fluid.  Moderate stool and gas noted within rectum. The patient is status post hysterectomy. The urinary bladder is unremarkable.  IMPRESSION: 1. Sigmoid colon diverticula are noted. In axial image 64 there is mild thickening of diverticular colonic wall in left lower quadrant and mild stranding of pericolonic fat. This is confirmed on sagittal image 90. Findings are consistent with mild diverticulitis. No diverticular abscess is noted. 2. No hydronephrosis or hydroureter. 3. Status postcholecystectomy. 4. No nephrolithiasis. 5. No pericecal inflammation. Normal appendix is partially visualized. 6. Status post hysterectomy.   Electronically Signed   By: Lahoma Crocker M.D.   On: 08/08/2013 13:59    EKG Interpretation     Ventricular Rate:  73 PR Interval:  170 QRS Duration: 90 QT Interval:  386 QTC Calculation: 425 R Axis:   31 Text Interpretation:  Normal sinus rhythm Nonspecific T wave abnormality Abnormal ECG When compared with ECG of 06-Aug-2011 19:36, No significant change was found No significant change was found            MDM   1. Diverticulitis    Hx Crohn's, lower abdominal pain since yesterday. Associated with abdominal distension and nausea.  EKG nsr.  Troponin negative.  Catheterization in 2011 showed normal coronaries. Episode  of chest tightness yesterday seems atypical for ACS.  Recent negative cath. EKG unchanged. Troponin and D-dimer negative.  Patient has abdominal pain without peritoneal signs. Labs are essentially unremarkable.  CT shows uncomplicated diverticulitis  without abscess or perforation.  Tolerating PO in the ED.  Will treat with PO antibiotics, pain meds, antiemetics. followup with PCP. Return precautions discussed.  I personally performed the services described in this documentation, which was scribed in my presence. The recorded information has been reviewed and is accurate.    [name] Essex, MD 08/08/13 [phone]

## 2013-08-10 ENCOUNTER — Encounter (INDEPENDENT_AMBULATORY_CARE_PROVIDER_SITE_OTHER): Payer: Self-pay | Admitting: Internal Medicine

## 2013-08-10 ENCOUNTER — Ambulatory Visit (INDEPENDENT_AMBULATORY_CARE_PROVIDER_SITE_OTHER): Payer: 59 | Admitting: Internal Medicine

## 2013-08-10 ENCOUNTER — Encounter (INDEPENDENT_AMBULATORY_CARE_PROVIDER_SITE_OTHER): Payer: Self-pay | Admitting: *Deleted

## 2013-08-10 VITALS — BP 122/78 | HR 80 | Temp 98.6°F | Ht 72.0 in | Wt 252.3 lb

## 2013-08-10 DIAGNOSIS — K5732 Diverticulitis of large intestine without perforation or abscess without bleeding: Secondary | ICD-10-CM | POA: Insufficient documentation

## 2013-08-10 NOTE — Progress Notes (Signed)
Subjective:     Patient ID: [name], female   DOB: Feb 08, 1966, 47 y.o.   MRN: [id number]  HPI Recently seen in the ED (08/08/2013) with abdominal pain. The pain started the 08/07/2013. The radiated down to her lower left abdomen. She says her abdomen was distended and tight. She had nausea. She had constipation and bloating associated with her symptoms four days prior to having abdominal pain. Her constipation resolved her abdominal pain increased. She had a low grade fever. Evaluated in the ED and CT revealed mild diverticulitis. Given Cipro and Flagyl x 10 days. She tells me today she feels soreness in her abdomen. She feels weak. There is not fever.  She has not had a BM in 3 days.  She denies hx of constipation.  She usually has 5-10 stools a day.  She tells me in 2007, she thinks she had diverticulitis.   She has a hx of Crohn's disease diagnosed about 2 yrs ago.     10/28/2010 Small Bowel IMPRESSION:  No evidence of small bowel obstruction or dilatation.  Segmental wall thickening of the terminal ileum, question Crohn's  disease versus infectious etiology.    10/28/2010 Given Capsule:   Segmental abnormality to mid small bowel with mucosal edema erythema and deformity to lumen without high-grade stricture. Multiple erosions noted in terminal ileum along with 2 ulcers and mucosal nodularity.    CBC    Component Value Date/Time   WBC 5.1 08/08/2013 1041   RBC 4.39 08/08/2013 1041   HGB 12.4 08/08/2013 1041   HCT 37.6 08/08/2013 1041   PLT 357 08/08/2013 1041   MCV 85.6 08/08/2013 1041   MCH 28.2 08/08/2013 1041   MCHC 33.0 08/08/2013 1041   RDW 14.3 08/08/2013 1041   LYMPHSABS 1.5 08/08/2013 1041   MONOABS 0.3 08/08/2013 1041   EOSABS 0.1 08/08/2013 1041   BASOSABS 0.0 08/08/2013 1041   CMP     Component Value Date/Time   NA 139 08/08/2013 1041   K 3.2* 08/08/2013 1041   CL 103 08/08/2013 1041   CO2 27 08/08/2013 1041   GLUCOSE 118* 08/08/2013 1041   BUN 9 08/08/2013 1041   CREATININE 0.82 08/08/2013 1041   CALCIUM 9.5 08/08/2013 1041   PROT 7.2 08/08/2013 1041   ALBUMIN 3.7 08/08/2013 1041   AST 15 08/08/2013 1041   ALT 13 08/08/2013 1041   ALKPHOS 58 08/08/2013 1041   BILITOT 0.6 08/08/2013 1041   GFRNONAA 85* 08/08/2013 1041   GFRAA >90 08/08/2013 1041       HPI Comments:    08/08/2013 CT abdomen/pelvis with CM:  1. Sigmoid colon diverticula are noted. In axial image 64 there is  mild thickening of diverticular colonic wall in left lower quadrant  and mild stranding of pericolonic fat. This is confirmed on sagittal  image 90. Findings are consistent with mild diverticulitis. No  diverticular abscess is noted.  2. No hydronephrosis or hydroureter.  3. Status postcholecystectomy.  4. No nephrolithiasis.  5. No pericecal inflammation. Normal appendix is partially  visualized.  6. Status post hysterectomy.     Review of Systems see hpi Current Outpatient Prescriptions  Medication Sig Dispense Refill  . acetaminophen (TYLENOL) 500 MG tablet Take 1,500 mg by mouth daily as needed for mild pain.      Marland Kitchen albuterol (PROVENTIL) (2.5 MG/3ML) 0.083% nebulizer solution Take 2.5 mg by nebulization every 6 (six) hours as needed. For shortness of breath      . budesonide (ENTOCORT EC)  3 MG 24 hr capsule Take 3 mg by mouth daily as needed. Crohn's Disease Flare-Up      . cetirizine (ZYRTEC) 10 MG tablet Take 10 mg by mouth daily as needed. For Allergies      . ciprofloxacin (CIPRO) 500 MG tablet Take 1 tablet (500 mg total) by mouth every 12 (twelve) hours.  20 tablet  0  . cyclobenzaprine (FLEXERIL) 10 MG tablet Take 1 tablet (10 mg total) by mouth 3 (three) times daily as needed. For muscle spasms  40 tablet  2  . hyoscyamine (ANASPAZ) 0.125 MG TBDP Place 0.125 mg under the tongue 4 (four) times daily as needed. For GI      . metroNIDAZOLE (FLAGYL) 500 MG tablet Take 1 tablet (500 mg total) by mouth 2 (two) times daily.  20 tablet  0  . ondansetron (ZOFRAN) 4 MG  tablet Take 1 tablet (4 mg total) by mouth every 6 (six) hours.  12 tablet  0  . oxyCODONE-acetaminophen (PERCOCET/ROXICET) 5-325 MG per tablet Take 2 tablets by mouth every 4 (four) hours as needed for severe pain.  15 tablet  0  . esomeprazole (NEXIUM) 40 MG capsule Take 40 mg by mouth daily before breakfast.        No current facility-administered medications for this visit.   Past Medical History  Diagnosis Date  . Chest pain     cath 2005 norm cors, repeat cath Feb 2011 normal cors and only minimally  elevated pulmonary pressures  . Palpitations   . Obesity   . Hiatal hernia   . Sleep apnea sleep study 11/03/2009    mild-no cpap  . GERD (gastroesophageal reflux disease)   . Crohn disease    Past Surgical History  Procedure Laterality Date  . Cesarean section    . Abdominal hysterectomy    . Shoulder surgery      right x 2   . Cholecystectomy  09/08/2006    lap. chole.  . Inguinal hernia repair  10/30/2008    right  . Ankle arthroscopy  06/01/2012    Procedure: ANKLE ARTHROSCOPY;  Surgeon: Colin Rhein, MD;  Location: [facility];  Service: Orthopedics;  Laterality: Left;  left ankle arthorsocpy with extensive debridement and gastroc slide  . Cardiac catheterization  11/22/2009 and 2005    WNL  . Chondroplasty  06/17/2012    Procedure: CHONDROPLASTY;  Surgeon: Carole Civil, MD;  Location: [facility];  Service: Orthopedics;  Laterality: Right;  right patella         Objective:   Physical Exam  Filed Vitals:   08/10/13 1408  BP: 122/78  Pulse: 80  Temp: 98.6 F (37 C)  Height: 6' (1.829 m)  Weight: 252 lb 4.8 oz (114.443 kg)    Alert and oriented. Skin warm and dry. Oral mucosa is moist.   . Sclera anicteric, conjunctivae is pink. Thyroid not enlarged. No cervical lymphadenopathy. Lungs clear. Heart regular rate and rhythm.  Abdomen is soft. Bowel sounds are positive. No hepatomegaly. No abdominal masses felt. Tenderness left lower abdomen.   No edema  to lower extremities.       Assessment:    Diverticulitis. She says she feels somewhat better. Her abdomen is soft. She tells me she is 40% better. No fever. She does not look acutely ill.    Plan:    Low residue die/bland diet. OV in 2 weeks. If symptoms worsen go to the ED. Finish all of the antibiotics Stool  softener as needed.

## 2013-08-10 NOTE — Patient Instructions (Signed)
Bland diet or clear liquids. OV in 2 weeks. If symptoms worsen, go to the ED.

## 2013-08-16 ENCOUNTER — Ambulatory Visit (INDEPENDENT_AMBULATORY_CARE_PROVIDER_SITE_OTHER): Payer: Self-pay | Admitting: Internal Medicine

## 2013-08-29 ENCOUNTER — Encounter (INDEPENDENT_AMBULATORY_CARE_PROVIDER_SITE_OTHER): Payer: Self-pay | Admitting: Internal Medicine

## 2013-08-29 ENCOUNTER — Ambulatory Visit (INDEPENDENT_AMBULATORY_CARE_PROVIDER_SITE_OTHER): Payer: 59 | Admitting: Internal Medicine

## 2013-08-29 VITALS — BP 118/84 | HR 80 | Ht 72.0 in | Wt 250.1 lb

## 2013-08-29 DIAGNOSIS — K5090 Crohn's disease, unspecified, without complications: Secondary | ICD-10-CM

## 2013-08-29 DIAGNOSIS — K5732 Diverticulitis of large intestine without perforation or abscess without bleeding: Secondary | ICD-10-CM

## 2013-08-29 DIAGNOSIS — K5792 Diverticulitis of intestine, part unspecified, without perforation or abscess without bleeding: Secondary | ICD-10-CM

## 2013-08-29 LAB — C-REACTIVE PROTEIN: CRP: <0.5 mg/dL (ref <0.60)

## 2013-08-29 NOTE — Progress Notes (Signed)
Subjective:     Patient ID: [name], female   DOB: January 14, 1966, 47 y.o.   MRN: [id number]  HPI  Here today for f/u.  Recently seen in the ED (08/08/2013) with abdominal pain. The pain started the 08/07/2013. The radiated down to her lower left abdomen. She says her abdomen was distended and tight. She had nausea. She had constipation and bloating associated with her symptoms four days prior to having abdominal pain. Her constipation resolved and her  abdominal pain increased. She had a low grade fever. Evaluated in the ED and CT revealed mild diverticulitis. Given Cipro and Flagyl x 10 days. She usually has 5-10 stools a day.  After bout of diverticulitis, she was having 2 stools a day.  She tells me she has not had a stool since Friday (4 days).. She has tried a stool softener.  She has abdominal pain which comes and goes. She will feel pressure.  She tells me in 2007, she thinks she had diverticulitis.   She has a hx of Crohn's disease diagnosed about 2 yrs ago.  Colonoscopy:2011 Scattered diverticula thru out the colon. No evidence of endoscopic colitis. 10/28/2010 Small Bowel  IMPRESSION:  No evidence of small bowel obstruction or dilatation.  Segmental wall thickening of the terminal ileum, question Crohn's  disease versus infectious etiology.  10/28/2010 Given Capsule:  Segmental abnormality to mid small bowel with mucosal edema erythema and deformity to lumen without high-grade stricture. Multiple erosions noted in terminal ileum along with 2 ulcers and mucosal nodularity.       08/08/2013 CT abdomen/pelvis with CM:  1. Sigmoid colon diverticula are noted. In axial image 64 there is   mild thickening of diverticular colonic wall in left lower quadrant  and mild stranding of pericolonic fat. This is confirmed on sagittal  image 90. Findings are consistent with mild diverticulitis. No  diverticular abscess is noted.  2. No hydronephrosis or hydroureter.  3. Status postcholecystectomy.   4. No nephrolithiasis.  5. No pericecal inflammation. Normal appendix is partially  visualized.  6. Status post hysterectomy.    Review of Systems see hpi Current Outpatient Prescriptions  Medication Sig Dispense Refill  . acetaminophen (TYLENOL) 500 MG tablet Take 1,500 mg by mouth daily as needed for mild pain.      Marland Kitchen albuterol (PROVENTIL) (2.5 MG/3ML) 0.083% nebulizer solution Take 2.5 mg by nebulization every 6 (six) hours as needed. For shortness of breath      . budesonide (ENTOCORT EC) 3 MG 24 hr capsule Take 3 mg by mouth daily as needed. Crohn's Disease Flare-Up      . cetirizine (ZYRTEC) 10 MG tablet Take 10 mg by mouth daily as needed. For Allergies      . ciprofloxacin (CIPRO) 500 MG tablet Take 1 tablet (500 mg total) by mouth every 12 (twelve) hours.  20 tablet  0  . cyclobenzaprine (FLEXERIL) 10 MG tablet Take 1 tablet (10 mg total) by mouth 3 (three) times daily as needed. For muscle spasms  40 tablet  2  . esomeprazole (NEXIUM) 40 MG capsule Take 40 mg by mouth daily before breakfast.       . hyoscyamine (ANASPAZ) 0.125 MG TBDP Place 0.125 mg under the tongue 4 (four) times daily as needed. For GI      . metroNIDAZOLE (FLAGYL) 500 MG tablet Take 1 tablet (500 mg total) by mouth 2 (two) times daily.  20 tablet  0  . ondansetron (ZOFRAN) 4 MG tablet Take 1  tablet (4 mg total) by mouth every 6 (six) hours.  12 tablet  0  . oxyCODONE-acetaminophen (PERCOCET/ROXICET) 5-325 MG per tablet Take 2 tablets by mouth every 4 (four) hours as needed for severe pain.  15 tablet  0   No current facility-administered medications for this visit.   Past Medical History  Diagnosis Date  . Chest pain     cath 2005 norm cors, repeat cath Feb 2011 normal cors and only minimally  elevated pulmonary pressures  . Palpitations   . Obesity   . Hiatal hernia   . Sleep apnea sleep study 11/03/2009    mild-no cpap  . GERD (gastroesophageal reflux disease)   . Crohn disease    Allergies   Allergen Reactions  . Other Shortness Of Breath    Lemon grass  . Wasp Venom Anaphylaxis and Shortness Of Breath  . Hydromorphone Hcl Nausea And Vomiting  . Omnipaque [Iohexol] Hives and Nausea Only    Pt. Was premedicated with emergent protocol.  . Pollen Extract Swelling  . Adhesive [Tape] Rash        Objective:   Physical Exam There were no vitals filed for this visit. Filed Vitals:   08/29/13 1118  BP: 118/84  Pulse: 80  Height: 6' (1.829 m)  Weight: 250 lb 1.6 oz (113.445 kg)  Alert and oriented. Skin warm and dry. Oral mucosa is moist.   . Sclera anicteric, conjunctivae is pink. Thyroid not enlarged. No cervical lymphadenopathy. Lungs clear. Heart regular rate and rhythm.  Abdomen is soft. Bowel sounds are positive. No hepatomegaly. No abdominal masses felt. Slight lower abdominal tenderness.  No edema to lower extremities.    .     Assessment:   Diverticulitis which I think has resolved. She does c/o some constipation today. Hx of Crohn's small bowel.       Plan:    Miralax daily. CT abdomen/pelvis to be sure her diverticulitis has resolved.

## 2013-08-29 NOTE — Patient Instructions (Signed)
Miralax. CT abdomen/pelvis.

## 2013-08-30 ENCOUNTER — Ambulatory Visit (HOSPITAL_COMMUNITY)
Admission: RE | Admit: 2013-08-30 | Discharge: 2013-08-30 | Disposition: A | Discharge status: Home / Self Care | Payer: 59 | Source: Ambulatory Visit | Attending: Internal Medicine | Admitting: Internal Medicine

## 2013-08-30 ENCOUNTER — Telehealth (INDEPENDENT_AMBULATORY_CARE_PROVIDER_SITE_OTHER): Payer: Self-pay | Admitting: Internal Medicine

## 2013-08-30 DIAGNOSIS — K5792 Diverticulitis of intestine, part unspecified, without perforation or abscess without bleeding: Secondary | ICD-10-CM

## 2013-08-30 DIAGNOSIS — K5732 Diverticulitis of large intestine without perforation or abscess without bleeding: Secondary | ICD-10-CM

## 2013-08-30 DIAGNOSIS — R109 Unspecified abdominal pain: Secondary | ICD-10-CM | POA: Insufficient documentation

## 2013-08-30 MED ORDER — METRONIDAZOLE 500 MG PO TABS: 500.0000 mg | ORAL_TABLET | Freq: Two times a day (BID) | ORAL | Status: DC

## 2013-08-30 MED ORDER — FLUCONAZOLE 100 MG PO TABS: 100.0000 mg | ORAL_TABLET | Freq: Every day | ORAL | Status: DC

## 2013-08-30 MED ORDER — CIPROFLOXACIN HCL 500 MG PO TABS: 500.0000 mg | ORAL_TABLET | Freq: Two times a day (BID) | ORAL | Status: DC

## 2013-08-30 NOTE — Telephone Encounter (Signed)
error 

## 2013-08-30 NOTE — Telephone Encounter (Signed)
Rx for Cipro and Flagyl and Diflucan sent to her pharmacy. She continues to have a small amount of pain in her left lower quadrant.  She did have a BM this am which was soft.

## 2013-10-03 ENCOUNTER — Ambulatory Visit (INDEPENDENT_AMBULATORY_CARE_PROVIDER_SITE_OTHER): Payer: 59 | Admitting: Internal Medicine

## 2013-10-03 ENCOUNTER — Encounter (INDEPENDENT_AMBULATORY_CARE_PROVIDER_SITE_OTHER): Payer: Self-pay | Admitting: Internal Medicine

## 2013-10-03 VITALS — BP 112/78 | HR 70 | Temp 98.2°F | Ht 72.0 in | Wt 251.5 lb

## 2013-10-03 DIAGNOSIS — K5732 Diverticulitis of large intestine without perforation or abscess without bleeding: Secondary | ICD-10-CM

## 2013-10-03 LAB — CBC WITH DIFFERENTIAL/PLATELET
Eosinophils Absolute: 0.1 10*3/uL (ref 0.0–0.7)
Eosinophils Relative: 3 % (ref 0–5)
Hemoglobin: 13 g/dL (ref 12.0–15.0)
Lymphocytes Relative: 34 % (ref 12–46)
Lymphs Abs: 1.4 10*3/uL (ref 0.7–4.0)
MCH: 27.9 pg (ref 26.0–34.0)
MCV: 83 fL (ref 78.0–100.0)
Monocytes Absolute: 0.3 10*3/uL (ref 0.1–1.0)
Neutro Abs: 2.3 10*3/uL (ref 1.7–7.7)
Platelets: 387 10*3/uL (ref 150–400)
RBC: 4.66 MIL/uL (ref 3.87–5.11)
WBC: 4.2 10*3/uL (ref 4.0–10.5)

## 2013-10-03 LAB — SEDIMENTATION RATE: Sed Rate: 5 mm/hr (ref 0–22)

## 2013-10-03 MED ORDER — METRONIDAZOLE 500 MG PO TABS
500.0000 mg | ORAL_TABLET | Freq: Two times a day (BID) | ORAL | Status: DC
Start: 2013-10-03 — End: 2013-10-26

## 2013-10-03 MED ORDER — CIPROFLOXACIN HCL 500 MG PO TABS: 500.0000 mg | ORAL_TABLET | Freq: Two times a day (BID) | ORAL | Status: DC

## 2013-10-03 MED ORDER — FLUCONAZOLE 100 MG PO TABS: 100.0000 mg | ORAL_TABLET | Freq: Every day | ORAL | Status: DC

## 2013-10-03 NOTE — Patient Instructions (Signed)
CBC,sedrate, Rx for Cipro and Flagyl. OV in 2 weeks.

## 2013-10-03 NOTE — Progress Notes (Signed)
Subjective:     Patient ID: [name], female   DOB: 1966/07/04, 47 y.o.   MRN: [id number]  HPI Here today as a work in. She tells me she has nausea and pain. She has pressure in her lower back. She also has had chills. She has bloating and is gassy. She tells me she had a fever 100.9 07/27/2013 and 100.1 on Christmas eve. She has rectal pain and  left sided of her abdomen.  When she eats foods feel like foods are sitting in her chest. She is on a bland diet.  Appetite is not good. She is eating in small amounts. She is having a stool every 2 days.  Her stools are pale brown and pasty. She is taking Miralax for constipation Cholecystectomy in 2007.  Colonoscopy:2011 Scattered diverticula thru out the colon. No evidence of endoscopic colitis.  10/28/2010 Small Bowel  IMPRESSION:  No evidence of small bowel obstruction or dilatation.  Segmental wall thickening of the terminal ileum, question Crohn's  disease versus infectious etiology.  10/28/2010 Given Capsule:  Segmental abnormality to mid small bowel with mucosal edema erythema and deformity to lumen without high-grade stricture. Multiple erosions noted in terminal ileum along with 2 ulcers and mucosal nodularity.  08/08/2013 CT abdomen/pelvis with CM:  1. Sigmoid colon diverticula are noted. In axial image 64 there is  mild thickening of diverticular colonic wall in left lower quadrant  and mild stranding of pericolonic fat. This is confirmed on sagittal  image 90. Findings are consistent with mild diverticulitis. No  diverticular abscess is noted.  2. No hydronephrosis or hydroureter.  3. Status postcholecystectomy.  4. No nephrolithiasis.  5. No pericecal inflammation. Normal appendix is partially  visualized.  6. Status post hysterectomy.  08/29/2013 CT abdomen/pelvis with CM: Interim improvement of inflammatory changes left lower quadrant  consistent with improving diverticulitis.      Review of Systems Current  Outpatient Prescriptions  Medication Sig Dispense Refill  . acetaminophen (TYLENOL) 500 MG tablet Take 1,500 mg by mouth daily as needed for mild pain.      Marland Kitchen albuterol (PROVENTIL) (2.5 MG/3ML) 0.083% nebulizer solution Take 2.5 mg by nebulization every 6 (six) hours as needed. For shortness of breath      . budesonide (ENTOCORT EC) 3 MG 24 hr capsule Take 3 mg by mouth daily as needed. Crohn's Disease Flare-Up      . cetirizine (ZYRTEC) 10 MG tablet Take 10 mg by mouth daily as needed. For Allergies      . cyclobenzaprine (FLEXERIL) 10 MG tablet Take 1 tablet (10 mg total) by mouth 3 (three) times daily as needed. For muscle spasms  40 tablet  2  . hyoscyamine (ANASPAZ) 0.125 MG TBDP Place 0.125 mg under the tongue 4 (four) times daily as needed. For GI      . metroNIDAZOLE (FLAGYL) 500 MG tablet Take 1 tablet (500 mg total) by mouth 2 (two) times daily.  20 tablet  0  . ondansetron (ZOFRAN) 4 MG tablet Take 1 tablet (4 mg total) by mouth every 6 (six) hours.  12 tablet  0  . ciprofloxacin (CIPRO) 500 MG tablet Take 1 tablet (500 mg total) by mouth 2 (two) times daily.  20 tablet  0  . ciprofloxacin (CIPRO) 500 MG tablet Take 1 tablet (500 mg total) by mouth 2 (two) times daily.  20 tablet  0  . esomeprazole (NEXIUM) 40 MG capsule Take 40 mg by mouth as needed.       Marland Kitchen  fluconazole (DIFLUCAN) 100 MG tablet Take 1 tablet (100 mg total) by mouth daily.  1 tablet  0   No current facility-administered medications for this visit.   Past Medical History  Diagnosis Date  . Chest pain     cath 2005 norm cors, repeat cath Feb 2011 normal cors and only minimally  elevated pulmonary pressures  . Palpitations   . Obesity   . Hiatal hernia   . Sleep apnea sleep study 11/03/2009    mild-no cpap  . GERD (gastroesophageal reflux disease)   . Crohn disease    Past Surgical History  Procedure Laterality Date  . Cesarean section    . Abdominal hysterectomy    . Shoulder surgery      right x 2   .  Cholecystectomy  09/08/2006    lap. chole.  . Inguinal hernia repair  10/30/2008    right  . Ankle arthroscopy  06/01/2012    Procedure: ANKLE ARTHROSCOPY;  Surgeon: Colin Rhein, MD;  Location: [place];  Service: Orthopedics;  Laterality: Left;  left ankle arthorsocpy with extensive debridement and gastroc slide  . Cardiac catheterization  11/22/2009 and 2005    WNL  . Chondroplasty  06/17/2012    Procedure: CHONDROPLASTY;  Surgeon: Carole Civil, MD;  Location: [facility];  Service: Orthopedics;  Laterality: Right;  right patella  \ Allergies  Allergen Reactions  . Other Shortness Of Breath    Lemon grass  . Wasp Venom Anaphylaxis and Shortness Of Breath  . Hydromorphone Hcl Nausea And Vomiting  . Omnipaque [Iohexol] Hives and Nausea Only    Pt. Was premedicated with emergent protocol.  . Pollen Extract Swelling  . Adhesive [Tape] Rash        Objective:   Physical Exam  Filed Vitals:   10/03/13 1040  BP: 112/78  Pulse: 70  Temp: 98.2 F (36.8 C)  Height: 6' (1.829 m)  Weight: 251 lb 8 oz (114.08 kg)   Alert and oriented. Skin warm and dry. Oral mucosa is moist.   . Sclera anicteric, conjunctivae is pink. Thyroid not enlarged. No cervical lymphadenopathy. Lungs clear. Heart regular rate and rhythm.  Abdomen is soft. Bowel sounds are positive. No hepatomegaly. No abdominal masses felt. Some tenderness left lower quadrant. Rates pain 4/10 at worse.  No edema to lower extremities.       Assessment:    Probably resolving diverticulitis.  She does not have any alarm systems at this time. Very cheerful in the office. She did run a fever the 23rd and 24th of this month. Recent CT revealed resolving diverticulitis.     Plan:    CBC, sedrate, Rx for Cipro and Flagyl. OV in 2 weeks. If not better will consider a repeat CT. I will discuss with Dr. Laural Golden.  Next flare will refer to a surgeon.

## 2013-10-17 ENCOUNTER — Encounter (INDEPENDENT_AMBULATORY_CARE_PROVIDER_SITE_OTHER): Payer: Self-pay | Admitting: Internal Medicine

## 2013-10-17 ENCOUNTER — Ambulatory Visit (INDEPENDENT_AMBULATORY_CARE_PROVIDER_SITE_OTHER): Payer: 59 | Admitting: Internal Medicine

## 2013-10-17 VITALS — BP 108/74 | HR 72 | Temp 98.9°F | Ht 72.0 in | Wt 252.9 lb

## 2013-10-17 DIAGNOSIS — R110 Nausea: Secondary | ICD-10-CM

## 2013-10-17 DIAGNOSIS — K5732 Diverticulitis of large intestine without perforation or abscess without bleeding: Secondary | ICD-10-CM

## 2013-10-17 MED ORDER — ONDANSETRON HCL 4 MG PO TABS: 4.0000 mg | ORAL_TABLET | Freq: Three times a day (TID) | ORAL | Status: DC | PRN

## 2013-10-17 MED ORDER — ONDANSETRON HCL 4 MG PO TABS: 4.0000 mg | ORAL_TABLET | Freq: Four times a day (QID) | ORAL | Status: DC

## 2013-10-17 NOTE — Patient Instructions (Signed)
OV in 6 months with Dr Laural Golden. Low residue diet for another 2 weeks.  Take Nexium daily instead of prn

## 2013-10-17 NOTE — Progress Notes (Signed)
Subjective:     Patient ID: [name], female   DOB: Sep 16, 1966, 48 y.o.   MRN: [id number]  HPI Here today for f/u after recent bout with diverticulitis. She was last seen the end of December with c/o of bloating and feeling gassy. She also c/o of having a fever on Christmas eve.  She also had left sided abdominal pain. She was empirically treated with Cipro and Flagyl.  She tells me she still has some nausea and some epigastric tenderness. Her appetite is okay. There has been no weight loss.  Sometimes she has a feeling of fullness after she starts to eats. She is having a BM every 3 days.  She is on a bland diet. She is avoiding wheat and grains.  No melena or bright red rectal bleeding.   Hx of small bowel Crohn;s.  CBC    Component Value Date/Time   WBC 4.2 10/03/2013 1112   RBC 4.66 10/03/2013 1112   HGB 13.0 10/03/2013 1112   HCT 38.7 10/03/2013 1112   PLT 387 10/03/2013 1112   MCV 83.0 10/03/2013 1112   MCH 27.9 10/03/2013 1112   MCHC 33.6 10/03/2013 1112   RDW 14.4 10/03/2013 1112   LYMPHSABS 1.4 10/03/2013 1112   MONOABS 0.3 10/03/2013 1112   EOSABS 0.1 10/03/2013 1112   BASOSABS 0.0 10/03/2013 1112    Erythrocyte Sedimentation Rate     Component Value Date/Time   ESRSEDRATE 5 10/03/2013 1112      08/30/2013 CT abdomen/pelvis with CM;  IMPRESSION:  Interim improvement of inflammatory changes left lower quadrant  consistent with improving diverticulitis.     Colonoscopy:2011 Scattered diverticula thru out the colon. No evidence of endoscopic colitis.  10/28/2010 Small Bowel  IMPRESSION:  No evidence of small bowel obstruction or dilatation.  Segmental wall thickening of the terminal ileum, question Crohn's  disease versus infectious etiology.  10/28/2010 Given Capsule:  Segmental abnormality to mid small bowel with mucosal edema erythema and deformity to lumen without high-grade stricture. Multiple erosions noted in terminal ileum along with 2 ulcers and  mucosal nodularity.  08/08/2013 CT abdomen/pelvis with CM:  1. Sigmoid colon diverticula are noted. In axial image 64 there is  mild thickening of diverticular colonic wall in left lower quadrant  and mild stranding of pericolonic fat. This is confirmed on sagittal  image 90. Findings are consistent with mild diverticulitis. No  diverticular abscess is noted.  2. No hydronephrosis or hydroureter.  3. Status postcholecystectomy.  4. No nephrolithiasis.  5. No pericecal inflammation. Normal appendix is partially  visualized.  6. Status post hysterectomy.     Review of Systems  Current Outpatient Prescriptions  Medication Sig Dispense Refill  . acetaminophen (TYLENOL) 500 MG tablet Take 1,500 mg by mouth daily as needed for mild pain.      Marland Kitchen albuterol (PROVENTIL) (2.5 MG/3ML) 0.083% nebulizer solution Take 2.5 mg by nebulization every 6 (six) hours as needed. For shortness of breath      . budesonide (ENTOCORT EC) 3 MG 24 hr capsule Take 3 mg by mouth daily as needed. Crohn's Disease Flare-Up      . cetirizine (ZYRTEC) 10 MG tablet Take 10 mg by mouth daily as needed. For Allergies      . ciprofloxacin (CIPRO) 500 MG tablet Take 1 tablet (500 mg total) by mouth 2 (two) times daily.  20 tablet  0  . cyclobenzaprine (FLEXERIL) 10 MG tablet Take 1 tablet (10 mg total) by mouth 3 (three) times daily  as needed. For muscle spasms  40 tablet  2  . esomeprazole (NEXIUM) 40 MG capsule Take 40 mg by mouth as needed.       . hyoscyamine (ANASPAZ) 0.125 MG TBDP Place 0.125 mg under the tongue 4 (four) times daily as needed. For GI      . ondansetron (ZOFRAN) 4 MG tablet Take 1 tablet (4 mg total) by mouth every 6 (six) hours.  12 tablet  0  . ciprofloxacin (CIPRO) 500 MG tablet Take 1 tablet (500 mg total) by mouth 2 (two) times daily.  20 tablet  0  . fluconazole (DIFLUCAN) 100 MG tablet Take 1 tablet (100 mg total) by mouth daily.  1 tablet  0  . metroNIDAZOLE (FLAGYL) 500 MG tablet Take 1 tablet  (500 mg total) by mouth 2 (two) times daily.  20 tablet  0   No current facility-administered medications for this visit.   Past Medical History  Diagnosis Date  . Chest pain     cath 2005 norm cors, repeat cath Feb 2011 normal cors and only minimally  elevated pulmonary pressures  . Palpitations   . Obesity   . Hiatal hernia   . Sleep apnea sleep study 11/03/2009    mild-no cpap  . GERD (gastroesophageal reflux disease)   . Crohn disease    Past Surgical History  Procedure Laterality Date  . Cesarean section    . Abdominal hysterectomy    . Shoulder surgery      right x 2   . Cholecystectomy  09/08/2006    lap. chole.  . Inguinal hernia repair  10/30/2008    right  . Ankle arthroscopy  06/01/2012    Procedure: ANKLE ARTHROSCOPY;  Surgeon: Colin Rhein, MD;  Location: [place];  Service: Orthopedics;  Laterality: Left;  left ankle arthorsocpy with extensive debridement and gastroc slide  . Cardiac catheterization  11/22/2009 and 2005    WNL  . Chondroplasty  06/17/2012    Procedure: CHONDROPLASTY;  Surgeon: Carole Civil, MD;  Location: [facility];  Service: Orthopedics;  Laterality: Right;  right patella   Allergies  Allergen Reactions  . Other Shortness Of Breath    Lemon grass  . Wasp Venom Anaphylaxis and Shortness Of Breath  . Hydromorphone Hcl Nausea And Vomiting  . Omnipaque [Iohexol] Hives and Nausea Only    Pt. Was premedicated with emergent protocol.  . Pollen Extract Swelling  . Adhesive [Tape] Rash        Objective:   Physical Exam Filed Vitals:   10/17/13 1015  BP: 108/74  Pulse: 72  Temp: 98.9 F (37.2 C)  Height: 6' (1.829 m)  Weight: 252 lb 14.4 oz (114.715 kg)   Alert and oriented. Skin warm and dry. Oral mucosa is moist.   . Sclera anicteric, conjunctivae is pink. Thyroid not enlarged. No cervical lymphadenopathy. Lungs clear. Heart regular rate and rhythm.  Abdomen is soft. Bowel sounds are positive. No hepatomegaly. No  abdominal masses felt. No tenderness.  No edema to lower extremities.       Assessment:    Diverticulitis which has now resolved. She seems to be doing better. She does have some nausea. Some bloating after eating.     Plan:    Continue low residue diet for 2 more weeks. Take the Nexium daily and not on a prn basis.  OV in 6 months with Dr. Janee Morn. Rx for Zofran sent to [facility].

## 2013-10-25 ENCOUNTER — Telehealth (INDEPENDENT_AMBULATORY_CARE_PROVIDER_SITE_OTHER): Payer: Self-pay | Admitting: *Deleted

## 2013-10-25 DIAGNOSIS — K5792 Diverticulitis of intestine, part unspecified, without perforation or abscess without bleeding: Secondary | ICD-10-CM

## 2013-10-25 DIAGNOSIS — R110 Nausea: Secondary | ICD-10-CM

## 2013-10-25 DIAGNOSIS — K5090 Crohn's disease, unspecified, without complications: Secondary | ICD-10-CM

## 2013-10-25 DIAGNOSIS — R109 Unspecified abdominal pain: Secondary | ICD-10-CM

## 2013-10-25 NOTE — Telephone Encounter (Signed)
Talked with patient; She needs CBC with diff and OV tomorrow around 4 pm

## 2013-10-25 NOTE — Telephone Encounter (Signed)
Would like to speak with [name] not Terri. She is having a lot of right side around to the back abd pain. She is at work, please call her cell at [phone].

## 2013-10-25 NOTE — Telephone Encounter (Signed)
I have talked with patient.She is having Nausea , a lot of it. Started today. Upper and lower abdominal pain, it feels prickly , sharpe pain that goes around to her right side into her back. She is having BM's started out firm then became soft , very foul odor.. Afebrile. Patient is not sure if this is Crohn's or Diverticulitis. Would like Dr.Rehman's recommendation , she may be reached at [phone]. Currently in  Orientation @ [employer] for PRN work, will be there until 7 pm tonight.

## 2013-10-26 ENCOUNTER — Encounter (INDEPENDENT_AMBULATORY_CARE_PROVIDER_SITE_OTHER): Payer: Self-pay | Admitting: Internal Medicine

## 2013-10-26 ENCOUNTER — Ambulatory Visit (INDEPENDENT_AMBULATORY_CARE_PROVIDER_SITE_OTHER): Payer: 59 | Admitting: Internal Medicine

## 2013-10-26 VITALS — BP 118/74 | HR 72 | Temp 98.2°F | Resp 18 | Ht 72.0 in | Wt 251.6 lb

## 2013-10-26 DIAGNOSIS — K589 Irritable bowel syndrome without diarrhea: Secondary | ICD-10-CM

## 2013-10-26 DIAGNOSIS — Z8719 Personal history of other diseases of the digestive system: Secondary | ICD-10-CM

## 2013-10-26 LAB — CBC WITH DIFFERENTIAL/PLATELET
BASOS ABS: 0 10*3/uL (ref 0.0–0.1)
Basophils Relative: 1 % (ref 0–1)
EOS ABS: 0.1 10*3/uL (ref 0.0–0.7)
EOS PCT: 2 % (ref 0–5)
HEMATOCRIT: 38.9 % (ref 36.0–46.0)
Hemoglobin: 12.9 g/dL (ref 12.0–15.0)
Lymphocytes Relative: 34 % (ref 12–46)
Lymphs Abs: 2 10*3/uL (ref 0.7–4.0)
MCH: 27.9 pg (ref 26.0–34.0)
MCHC: 33.2 g/dL (ref 30.0–36.0)
MCV: 84.2 fL (ref 78.0–100.0)
MONO ABS: 0.3 10*3/uL (ref 0.1–1.0)
Monocytes Relative: 5 % (ref 3–12)
Neutro Abs: 3.5 10*3/uL (ref 1.7–7.7)
Neutrophils Relative %: 58 % (ref 43–77)
Platelets: 358 10*3/uL (ref 150–400)
RBC: 4.62 MIL/uL (ref 3.87–5.11)
RDW: 14.8 % (ref 11.5–15.5)
WBC: 6 10*3/uL (ref 4.0–10.5)

## 2013-10-26 MED ORDER — POLYETHYLENE GLYCOL 3350 17 GM/SCOOP PO POWD: 17.0000 g | Freq: Every day | ORAL | Status: DC

## 2013-10-26 MED ORDER — POLYETHYLENE GLYCOL 3350 17 GM/SCOOP PO POWD: 0.5000 | Freq: Every day | ORAL | Status: DC

## 2013-10-26 MED ORDER — DICYCLOMINE HCL 10 MG PO CAPS: 10.0000 mg | ORAL_CAPSULE | Freq: Three times a day (TID) | ORAL | Status: DC

## 2013-10-26 NOTE — Patient Instructions (Signed)
Hemoccult x1. Keep symptom diary until office visit in 4 weeks. Can use Dulcolax suppository or Fleet enema if no BM in 2 days.

## 2013-10-26 NOTE — Telephone Encounter (Signed)
Lab request completed nad faxed to [facility] , patient was made aware to be here @ office @ 3:30.

## 2013-10-26 NOTE — Progress Notes (Signed)
Presenting complaint;  Lower abdominal pain and irregular bowel movements.  Subjective:  Patient is 48 year old female who was diagnosed with ileal Crohn's disease back in January 2012 and treated with budesonide. She was last seen in June 2012. Followup visit did not materialize as planned. She states she has been doing well other than frequent bowel movements. However in the early part of November 2014 she went 4 days without a bowel movement when she typically has 5-10 stools a day. She felt bloated and had abdominal pain. Finally she had bowel movement on 08/07/2013 followed by loose watery stools. She was seen in emergency room on 08/08/2013 head CT which suggested sigmoid diverticulitis and she was treated with 2 weeks of antibiotics. Her pain did not resolve completely and she was therefore given another week. She had followup CT on 08/28/2013 which showed significant improvement in inflammatory changes. She was given one more week of Cipro and Flagyl. She states she started to feel better during the holidays. She stopped having multiple stools per day. She's been using MiraLax on an as-needed basis. Yesterday she developed sharp shooting pain in right lower quadrant radiating up towards but it didn't last for long. She another episode. She also experienced nausea and chills but did not check her temperature. She did not have a BM yesterday. She had normal stool today. She denies melena or rectal bleeding. She complains of intermittent nausea. She has not lost any weight recently. She does not take NSAIDs anymore. She is taking Nexium on an as-needed basis for heartburn.  Prior GI workup; EGD and colonoscopy in December 2011 revealing small sliding hiatal hernia erosive gastritis, Mallory-Weiss tear and colonoscopy revealed ileal erosions and pancolonic diverticulosis. Biopsy from terminal ileum reveals erosions but no granulomas. Small bowel given capsule study in January 2012 revealed ileal  erosions and 2 ulcers. Small bowel follow-through in January 2012 was normal. [place] pelvic CT with contrast on 01/02/2011 reveals colonic diverticulosis and small hiatal hernia but no abnormality noted the small bowel. Celiac antibody panel was negative in November 2011. IBD panel in January 2012 was negative.   Current Medications: Current Outpatient Prescriptions  Medication Sig Dispense Refill  . acetaminophen (TYLENOL) 500 MG tablet Take 1,500 mg by mouth daily as needed for mild pain.      Marland Kitchen albuterol (PROVENTIL) (2.5 MG/3ML) 0.083% nebulizer solution Take 2.5 mg by nebulization every 6 (six) hours as needed. For shortness of breath      . cetirizine (ZYRTEC) 10 MG tablet Take 10 mg by mouth daily as needed. For Allergies      . cyclobenzaprine (FLEXERIL) 10 MG tablet Take 1 tablet (10 mg total) by mouth 3 (three) times daily as needed. For muscle spasms  40 tablet  2  . esomeprazole (NEXIUM) 40 MG capsule Take 40 mg by mouth as needed.       . hyoscyamine (ANASPAZ) 0.125 MG TBDP Place 0.125 mg under the tongue 4 (four) times daily as needed. For GI      . ondansetron (ZOFRAN) 4 MG tablet Take 1 tablet (4 mg total) by mouth every 8 (eight) hours as needed for nausea or vomiting.  30 tablet  0  . budesonide (ENTOCORT EC) 3 MG 24 hr capsule Take 3 mg by mouth daily as needed. Crohn's Disease Flare-Up       No current facility-administered medications for this visit.     Objective: Blood pressure 118/74, pulse 72, temperature 98.2 F (36.8 C), temperature source Oral, resp. rate  18, height 6' (1.829 m), weight 251 lb 9.6 oz (114.125 kg). Patient is alert and in no acute distress. Conjunctiva is pink. Sclera is nonicteric Oropharyngeal mucosa is normal. No neck masses or thyromegaly noted. Cardiac exam with regular rhythm normal S1 and S2. No murmur or gallop noted. Lungs are clear to auscultation. Abdomen is full. Bowel sounds are hyperactive. On palpation abdomen is soft with mild  tenderness in both iliac fossae without guarding or rebound. No organomegaly or masses. No LE edema or clubbing noted.  Labs/studies Results: CBC on 10/03/2013 was within normal limits. Sedimentation rate was 5 and CRP was less than 0.5 Old abdominopelvic CTs from November 2014 b with Dr. Su Hoff.   Assessment:  #1. Patient's current symptoms are felt to be due to irritable bowel syndrome.      She was treated for diverticulitis starting in November 2014 for 4 weeks of antibiotic therapy. Then she was having pain on the left side and now it is on the right side. #2. History of ileitis felt to be secondary to Crohn's disease. He was treated with 6 weeks of Entocort EC and 2012 and appears to be in remission. Imaging studies from November 2014 do not show changes of ileitis. Therefore I doubt that she has a relapse of ileitis.   Plan:  Patient advised to keep symptom diary as to frequency of bowel movements and consistency as well as abdominal pain. Hemoccult x1. CBC. Dicyclomine 10 mg by mouth 3 times a day. MiraLax half to one scoop daily. If she does not have spontaneous bowel movement in 2 days she can use Fleet Enema and/or suppository. Office visit in 4 weeks.

## 2013-10-26 NOTE — Telephone Encounter (Signed)
Per Dr.Rehman the patient will need to have labs drawn prior to office visit 10-26-13.

## 2013-11-30 ENCOUNTER — Ambulatory Visit (INDEPENDENT_AMBULATORY_CARE_PROVIDER_SITE_OTHER): Payer: Self-pay | Admitting: Internal Medicine

## 2013-12-12 ENCOUNTER — Telehealth (INDEPENDENT_AMBULATORY_CARE_PROVIDER_SITE_OTHER): Payer: Self-pay | Admitting: *Deleted

## 2013-12-12 NOTE — Telephone Encounter (Signed)
Patient's DOS 11/30/13 was canceled due to the weather. LM on home and cell number two times asking for a return call to get her apt rescheduled. As of 12/12/13, I have not heard back from [place].

## 2013-12-15 ENCOUNTER — Encounter (HOSPITAL_COMMUNITY): Payer: Self-pay | Admitting: Emergency Medicine

## 2013-12-15 ENCOUNTER — Emergency Department (HOSPITAL_COMMUNITY)
Admission: EM | Admit: 2013-12-15 | Discharge: 2013-12-15 | Disposition: A | Discharge status: Home / Self Care | Payer: 59 | Attending: Emergency Medicine | Admitting: Emergency Medicine

## 2013-12-15 DIAGNOSIS — IMO0002 Reserved for concepts with insufficient information to code with codable children: Secondary | ICD-10-CM | POA: Insufficient documentation

## 2013-12-15 DIAGNOSIS — T5991XA Toxic effect of unspecified gases, fumes and vapors, accidental (unintentional), initial encounter: Secondary | ICD-10-CM | POA: Insufficient documentation

## 2013-12-15 DIAGNOSIS — F411 Generalized anxiety disorder: Secondary | ICD-10-CM | POA: Insufficient documentation

## 2013-12-15 DIAGNOSIS — T7840XA Allergy, unspecified, initial encounter: Secondary | ICD-10-CM

## 2013-12-15 DIAGNOSIS — Y9229 Other specified public building as the place of occurrence of the external cause: Secondary | ICD-10-CM | POA: Insufficient documentation

## 2013-12-15 DIAGNOSIS — R0682 Tachypnea, not elsewhere classified: Secondary | ICD-10-CM | POA: Insufficient documentation

## 2013-12-15 DIAGNOSIS — Y939 Activity, unspecified: Secondary | ICD-10-CM | POA: Insufficient documentation

## 2013-12-15 DIAGNOSIS — R0602 Shortness of breath: Secondary | ICD-10-CM | POA: Insufficient documentation

## 2013-12-15 DIAGNOSIS — R05 Cough: Secondary | ICD-10-CM

## 2013-12-15 DIAGNOSIS — Z79899 Other long term (current) drug therapy: Secondary | ICD-10-CM | POA: Insufficient documentation

## 2013-12-15 DIAGNOSIS — R6889 Other general symptoms and signs: Secondary | ICD-10-CM | POA: Insufficient documentation

## 2013-12-15 DIAGNOSIS — Z9889 Other specified postprocedural states: Secondary | ICD-10-CM | POA: Insufficient documentation

## 2013-12-15 DIAGNOSIS — R059 Cough, unspecified: Secondary | ICD-10-CM | POA: Insufficient documentation

## 2013-12-15 DIAGNOSIS — K219 Gastro-esophageal reflux disease without esophagitis: Secondary | ICD-10-CM | POA: Insufficient documentation

## 2013-12-15 LAB — CBC WITH DIFFERENTIAL/PLATELET
Basophils Absolute: 0 10*3/uL (ref 0.0–0.1)
Basophils Relative: 0 % (ref 0–1)
Eosinophils Absolute: 0 10*3/uL (ref 0.0–0.7)
Eosinophils Relative: 0 % (ref 0–5)
HCT: 39.7 % (ref 36.0–46.0)
Hemoglobin: 13.2 g/dL (ref 12.0–15.0)
Lymphocytes Relative: 7 % — ABNORMAL LOW (ref 12–46)
Lymphs Abs: 0.5 10*3/uL — ABNORMAL LOW (ref 0.7–4.0)
MCH: 28.5 pg (ref 26.0–34.0)
MCHC: 33.2 g/dL (ref 30.0–36.0)
MCV: 85.7 fL (ref 78.0–100.0)
Monocytes Absolute: 0.1 10*3/uL (ref 0.1–1.0)
Monocytes Relative: 1 % — ABNORMAL LOW (ref 3–12)
Neutro Abs: 6.7 10*3/uL (ref 1.7–7.7)
Neutrophils Relative %: 92 % — ABNORMAL HIGH (ref 43–77)
Platelets: 346 10*3/uL (ref 150–400)
RBC: 4.63 MIL/uL (ref 3.87–5.11)
RDW: 14.2 % (ref 11.5–15.5)
WBC: 7.3 10*3/uL (ref 4.0–10.5)

## 2013-12-15 LAB — BASIC METABOLIC PANEL
BUN: 11 mg/dL (ref 6–23)
CO2: 24 mEq/L (ref 19–32)
Calcium: 9.4 mg/dL (ref 8.4–10.5)
Chloride: 104 mEq/L (ref 96–112)
Creatinine, Ser: 0.75 mg/dL (ref 0.50–1.10)
GFR calc Af Amer: >90 mL/min (ref >90)
GFR calc non Af Amer: >90 mL/min (ref >90)
Glucose, Bld: 165 mg/dL — ABNORMAL HIGH (ref 70–99)
Potassium: 3.6 mEq/L — ABNORMAL LOW (ref 3.7–5.3)
Sodium: 143 mEq/L (ref 137–147)

## 2013-12-15 MED ORDER — EPINEPHRINE 0.3 MG/0.3ML IJ SOAJ: 0.3000 mg | Freq: Once | INTRAMUSCULAR | Status: DC

## 2013-12-15 MED ORDER — BENZONATATE 200 MG PO CAPS: 200.0000 mg | ORAL_CAPSULE | Freq: Three times a day (TID) | ORAL | Status: DC | PRN

## 2013-12-15 MED ORDER — DIPHENHYDRAMINE HCL 25 MG PO TABS: 25.0000 mg | ORAL_TABLET | Freq: Four times a day (QID) | ORAL | Status: DC

## 2013-12-15 MED ORDER — CEPASTAT 14.5 MG MT LOZG
1.0000 | LOZENGE | OROMUCOSAL | Status: DC | PRN
  Administered 2013-12-15: 1 via BUCCAL
  Filled 2013-12-15: qty 18

## 2013-12-15 MED ORDER — FAMOTIDINE 20 MG PO TABS: 20.0000 mg | ORAL_TABLET | Freq: Two times a day (BID) | ORAL | Status: DC

## 2013-12-15 MED ORDER — ALBUTEROL SULFATE (2.5 MG/3ML) 0.083% IN NEBU
2.5000 mg | INHALATION_SOLUTION | Freq: Once | RESPIRATORY_TRACT | Status: AC
  Administered 2013-12-15: 2.5 mg via RESPIRATORY_TRACT
  Filled 2013-12-15: qty 3

## 2013-12-15 MED ORDER — DIPHENHYDRAMINE HCL 50 MG/ML IJ SOLN
25.0000 mg | Freq: Once | INTRAMUSCULAR | Status: AC
  Administered 2013-12-15: 25 mg via INTRAVENOUS
  Filled 2013-12-15: qty 1

## 2013-12-15 MED ORDER — BENZONATATE 100 MG PO CAPS: 200.0000 mg | ORAL_CAPSULE | Freq: Three times a day (TID) | ORAL | Status: DC | PRN

## 2013-12-15 MED ORDER — PREDNISONE 20 MG PO TABS: 60.0000 mg | ORAL_TABLET | Freq: Every day | ORAL | Status: DC

## 2013-12-15 MED ORDER — LORAZEPAM 2 MG/ML IJ SOLN
0.5000 mg | Freq: Once | INTRAMUSCULAR | Status: AC
  Administered 2013-12-15: 0.5 mg via INTRAVENOUS
  Filled 2013-12-15: qty 1

## 2013-12-15 NOTE — ED Provider Notes (Addendum)
CSN: [id number]     Arrival date & time 12/15/13  0253 History   First MD Initiated Contact with Patient 12/15/13 [phone]     Chief Complaint  Patient presents with  . Allergic Reaction     (Consider location/radiation/quality/duration/timing/severity/associated sxs/prior Treatment) HPI 48 year old female presents to emergency room via EMS from [facility].  Patient was at work when she had an allergic reaction.  Patient believes that she began to react to 2 in air freshener.  Patient thinks she may have gotten some on her shoes.  Patient was complaining of swelling to her posterior throat and shortness of breath.  Patient received EpiPen, 50 mg of Benadryl, Zantac, and 125 Solu-Medrol, along with being breathing treatment.  During interventions, the patient appeared to have laryngeal spasms.  Per EMS, and could not breathe.  Patient was removed to the EMS truck, and had a nonrebreather placed on.  Patient reports she is feeling somewhat better.  Now, but is having a persistent cough and feels that coughing is irritating the swelling in her throat.  Patient has history of allergies to wasp venom, pollen.   Past Medical History  Diagnosis Date  . Chest pain     cath 2005 norm cors, repeat cath Feb 2011 normal cors and only minimally  elevated pulmonary pressures  . Palpitations   . Obesity   . Hiatal hernia   . Sleep apnea sleep study 11/03/2009    mild-no cpap  . GERD (gastroesophageal reflux disease)   . Crohn disease    Past Surgical History  Procedure Laterality Date  . Cesarean section    . Abdominal hysterectomy    . Shoulder surgery      right x 2   . Cholecystectomy  09/08/2006    lap. chole.  . Inguinal hernia repair  10/30/2008    right  . Ankle arthroscopy  06/01/2012    Procedure: ANKLE ARTHROSCOPY;  Surgeon: Colin Rhein, MD;  Location: [place];  Service: Orthopedics;  Laterality: Left;  left ankle arthorsocpy with extensive debridement and gastroc  slide  . Cardiac catheterization  11/22/2009 and 2005    WNL  . Chondroplasty  06/17/2012    Procedure: CHONDROPLASTY;  Surgeon: Carole Civil, MD;  Location: [facility];  Service: Orthopedics;  Laterality: Right;  right patella   Family History  Problem Relation Age of Onset  . Hypertension Mother   . Lupus Father   . COPD Father    History  Substance Use Topics  . Smoking status: Never Smoker   . Smokeless tobacco: Never Used  . Alcohol Use: No   OB History   Grav Para Term Preterm Abortions TAB SAB Ect Mult Living                 Review of Systems   See History of Present Illness; otherwise all other systems are reviewed and negative  Allergies  Other; Wasp venom; Hydromorphone hcl; Omnipaque; Pollen extract; and Adhesive  Home Medications   Current Outpatient Rx  Name  Route  Sig  Dispense  Refill  . acetaminophen (TYLENOL) 500 MG tablet   Oral   Take 1,500 mg by mouth daily as needed for mild pain.         Marland Kitchen albuterol (PROVENTIL) (2.5 MG/3ML) 0.083% nebulizer solution   Nebulization   Take 2.5 mg by nebulization every 6 (six) hours as needed. For shortness of breath         . budesonide (ENTOCORT  EC) 3 MG 24 hr capsule   Oral   Take 3 mg by mouth daily as needed. Crohn's Disease Flare-Up         . cetirizine (ZYRTEC) 10 MG tablet   Oral   Take 10 mg by mouth daily as needed. For Allergies         . cyclobenzaprine (FLEXERIL) 10 MG tablet   Oral   Take 1 tablet (10 mg total) by mouth 3 (three) times daily as needed. For muscle spasms   40 tablet   2   . dicyclomine (BENTYL) 10 MG capsule   Oral   Take 1 capsule (10 mg total) by mouth 3 (three) times daily before meals.   90 capsule   5   . esomeprazole (NEXIUM) 40 MG capsule   Oral   Take 40 mg by mouth as needed.          . hyoscyamine (ANASPAZ) 0.125 MG TBDP   Sublingual   Place 0.125 mg under the tongue 4 (four) times daily as needed. For GI         . ondansetron (ZOFRAN) 4 MG  tablet   Oral   Take 1 tablet (4 mg total) by mouth every 8 (eight) hours as needed for nausea or vomiting.   30 tablet   0   . polyethylene glycol powder (GLYCOLAX/MIRALAX) powder   Oral   Take 17 g by mouth daily.   527 g   3   . benzonatate (TESSALON) 200 MG capsule   Oral   Take 1 capsule (200 mg total) by mouth 3 (three) times daily as needed for cough.   20 capsule   0   . diphenhydrAMINE (BENADRYL) 25 MG tablet   Oral   Take 1 tablet (25 mg total) by mouth every 6 (six) hours.   20 tablet   0   . EPINEPHrine (EPIPEN) 0.3 mg/0.3 mL SOAJ injection   Intramuscular   Inject 0.3 mLs (0.3 mg total) into the muscle once.   1 Device   0   . famotidine (PEPCID) 20 MG tablet   Oral   Take 1 tablet (20 mg total) by mouth 2 (two) times daily.   30 tablet   0   . predniSONE (DELTASONE) 20 MG tablet   Oral   Take 3 tablets (60 mg total) by mouth daily.   15 tablet   0    BP 129/86  Pulse 75  Temp(Src) 98.5 F (36.9 C) (Oral)  Resp 13  SpO2 98% Physical Exam  Nursing note and vitals reviewed. Constitutional: She is oriented to person, place, and time. She appears well-developed and well-nourished. She appears distressed.  Morbidly obese, female, anxious appearing, tachypneic  HENT:  Head: Normocephalic and atraumatic.  Right Ear: External ear normal.  Left Ear: External ear normal.  Nose: Nose normal.  Mouth/Throat: Oropharynx is clear and moist.  No oropharynx swelling noted  Eyes: Conjunctivae and EOM are normal. Pupils are equal, round, and reactive to light.  Neck: Normal range of motion. Neck supple. No JVD present. No tracheal deviation present. No thyromegaly present.  No stridor, no soft tissue swelling of the neck.  No crepitus  Cardiovascular: Normal rate, regular rhythm, normal heart sounds and intact distal pulses.  Exam reveals no gallop and no friction rub.   No murmur heard. Pulmonary/Chest: Effort normal and breath sounds normal. No stridor. No  respiratory distress. She has no wheezes. She has no rales. She exhibits no tenderness.  Abdominal: Soft. Bowel sounds are normal. She exhibits no distension and no mass. There is no tenderness. There is no rebound and no guarding.  Musculoskeletal: Normal range of motion. She exhibits no edema and no tenderness.  Lymphadenopathy:    She has no cervical adenopathy.  Neurological: She is alert and oriented to person, place, and time. She exhibits normal muscle tone. Coordination normal.  Skin: Skin is warm and dry. No rash noted. No erythema. No pallor.  Psychiatric: She has a normal mood and affect. Her behavior is normal. Judgment and thought content normal.    ED Course  Procedures (including critical care time) Labs Review Labs Reviewed  CBC WITH DIFFERENTIAL  BASIC METABOLIC PANEL   Imaging Review No results found.   EKG Interpretation   Date/Time:  Friday December 15 2013 03:00:31 EDT Ventricular Rate:  77 PR Interval:  153 QRS Duration: 104 QT Interval:  534 QTC Calculation: 604 R Axis:   23 Text Interpretation:  Sinus rhythm Borderline T abnormalities, anterior  leads Prolonged QT interval Confirmed by Eldor Conaway  MD, Cathryne Mancebo ([id number]) on  12/15/2013 3:12:31 AM      MDM   Final diagnoses:  Allergic reaction  Cough    48 year old female with apparent allergic reaction.  No further signs of laryngeal spasm here.  Patient feeling much better after intervention.  Patient reassessed multiple times.  She received a small dose of Ativan to help with her anxiety related to the allergic reaction.  Plan to discharge home with cough suppressant and followup with her primary care Dr.  Loletta Parish AM Patient was getting ready to be discharged and in getting dressed, feels that she came back in contact with the irritant.  She now reports return of the swelling in the back of her throat and is having intermittent stridor.  Will discuss with ENT for their help with the bedside scope.    Kalman Drape, MD 12/15/13 [phone]  D/w Dr Redmond Baseman who will see the patient in the ER and plans to scope.      Kalman Drape, MD 12/15/13 [phone]

## 2013-12-15 NOTE — Consult Note (Signed)
Reason for Consult:respiratory distress Referring Physician: ER  [name] is an 48 y.o. female.  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  Early in the shift, she felt itching of her face and back and later started coughing.  With coughing, she developed significant difficulty breathing and hoarseness.  She was treated with epinephrine, Benadryl, Zantac, and Solu-medrol and given breathing treatments at [facility] before being transferred to the hospital by EMS.  She did well in the ER from breathing standpoint but continued with some cough.  When getting ready to be discharged, she felt the breathing trouble worsen again when she went to put her shoes on and smelled the same scent on her shoes.  Currently, she is breathing comfortably with some cough and a whispery voice.  Past Medical History  Diagnosis Date  . Chest pain     cath 2005 norm cors, repeat cath Feb 2011 normal cors and only minimally  elevated pulmonary pressures  . Palpitations   . Obesity   . Hiatal hernia   . Sleep apnea sleep study 11/03/2009    mild-no cpap  . GERD (gastroesophageal reflux disease)   . Crohn disease     Past Surgical History  Procedure Laterality Date  . Cesarean section    . Abdominal hysterectomy    . Shoulder surgery      right x 2   . Cholecystectomy  09/08/2006    lap. chole.  . Inguinal hernia repair  10/30/2008    right  . Ankle arthroscopy  06/01/2012    Procedure: ANKLE ARTHROSCOPY;  Surgeon: Colin Rhein, MD;  Location: [place];  Service: Orthopedics;  Laterality: Left;  left ankle arthorsocpy with extensive debridement and gastroc slide  . Cardiac catheterization  11/22/2009 and 2005    WNL  . Chondroplasty  06/17/2012    Procedure: CHONDROPLASTY;  Surgeon: Carole Civil, MD;  Location: [facility];  Service: Orthopedics;  Laterality: Right;  right patella    Family  History  Problem Relation Age of Onset  . Hypertension Mother   . Lupus Father   . COPD Father     Social History:  reports that she has never smoked. She has never used smokeless tobacco. She reports that she does not drink alcohol or use illicit drugs.  Allergies:  Allergies  Allergen Reactions  . Other Shortness Of Breath    Lemon grass  . Wasp Venom Anaphylaxis and Shortness Of Breath  . Hydromorphone Hcl Nausea And Vomiting  . Omnipaque [Iohexol] Hives and Nausea Only    Pt. Was premedicated with emergent protocol.  . Pollen Extract Swelling  . Adhesive [Tape] Rash    Medications: I have reviewed the patient's current medications.  Results for orders placed during the hospital encounter of 12/15/13 (from the past 48 hour(s))  CBC WITH DIFFERENTIAL     Status: Abnormal   Collection Time    12/15/13  8:20 AM      Result Value Ref Range   WBC 7.3  4.0 - 10.5 K/uL   RBC 4.63  3.87 - 5.11 MIL/uL   Hemoglobin 13.2  12.0 - 15.0 g/dL   HCT 39.7  36.0 - 46.0 %   MCV 85.7  78.0 - 100.0 fL   MCH 28.5  26.0 - 34.0 pg   MCHC 33.2  30.0 - 36.0 g/dL   RDW 14.2  11.5 - 15.5 %   Platelets 346  150 - 400 K/uL   Neutrophils Relative % 92 (*) 43 - 77 %   Neutro Abs 6.7  1.7 - 7.7 K/uL   Lymphocytes Relative 7 (*) 12 - 46 %   Lymphs Abs 0.5 (*) 0.7 - 4.0 K/uL   Monocytes Relative 1 (*) 3 - 12 %   Monocytes Absolute 0.1  0.1 - 1.0 K/uL   Eosinophils Relative 0  0 - 5 %   Eosinophils Absolute 0.0  0.0 - 0.7 K/uL   Basophils Relative 0  0 - 1 %   Basophils Absolute 0.0  0.0 - 0.1 K/uL  BASIC METABOLIC PANEL     Status: Abnormal   Collection Time    12/15/13  8:20 AM      Result Value Ref Range   Sodium 143  137 - 147 mEq/L   Potassium 3.6 (*) 3.7 - 5.3 mEq/L   Chloride 104  96 - 112 mEq/L   CO2 24  19 - 32 mEq/L   Glucose, Bld 165 (*) 70 - 99 mg/dL   BUN 11  6 - 23 mg/dL   Creatinine, Ser 0.75  0.50 - 1.10 mg/dL   Calcium 9.4  8.4 - 10.5 mg/dL   GFR calc non Af Amer >90  >90  mL/min   GFR calc Af Amer >90  >90 mL/min   Comment: (NOTE)     The eGFR has been calculated using the CKD EPI equation.     This calculation has not been validated in all clinical situations.     eGFR's persistently <90 mL/min signify possible Chronic Kidney     Disease.    No results found.  Review of Systems  Respiratory: Positive for cough and shortness of breath.   All other systems reviewed and are negative.   Blood pressure 138/88, pulse 75, temperature 98.5 F (36.9 C), temperature source Oral, resp. rate 17, SpO2 100.00%. Physical Exam  Constitutional: She is oriented to person, place, and time. She appears well-developed and well-nourished. No distress.  HENT:  Head: Normocephalic and atraumatic.  Right Ear: External ear normal.  Left Ear: External ear normal.  Nose: Nose normal.  Mouth/Throat: Oropharynx is clear and moist.  Whispering hoarseness.  Eyes: Conjunctivae and EOM are normal. Pupils are equal, round, and reactive to light.  Neck: Normal range of motion. Neck supple.  Cardiovascular: Normal rate.   Respiratory: Effort normal.  Musculoskeletal: Normal range of motion.  Neurological: She is alert and oriented to person, place, and time. No cranial nerve deficit.  Skin: Skin is warm and dry.  Psychiatric: She has a normal mood and affect. Her behavior is normal. Judgment and thought content normal.    Assessment/Plan: Laryngospasm In order to evaluate the upper airway more carefully, fiberoptic laryngoscopy was performed at the bedside.  See the procedure note.  There is no edema of the pharynx or larynx and vocal folds are moving normally.  Her hoarseness is due to whispering.  It seems that her breathing problem was likely laryngospasm caused by chemical exposure and coughing.  She had a similar problem happen in the past when exposed to lemon grass.  I recommended avoiding these sorts of chemicals and trained her in the sniff-hiss breathing technique to  break spasms.  We discussed potential utility of a speech pathology evaluation and she will consider.  She can be discharged and can follow-up with me as needed.  [name] 12/15/2013, 9:59 AM

## 2013-12-15 NOTE — Procedures (Signed)
Preop diagnosis: Laryngospasm Postop diagnosis: same Procedure: Transnasal fiberoptic laryngoscopy Surgeon: Redmond Baseman Anesthesia: Topical 4% lidocaine Compl: None Findings: No edema of pharynx or larynx.  Vocal folds are normally mobile.  With phonation, her posterior commissure does not close consistent with a whisper.  The left vocal fold has a very small red area consistent with small hemorrhage. Description: After discussing risks, benefits, and alternatives, the right nasal passage was sprayed with 4% lidocaine.  The flexible laryngoscope was passed through the passage to view the pharynx and larynx.  After completion, the scope was removed and she was returned to nursing care in stable condition.

## 2013-12-15 NOTE — ED Notes (Signed)
Pt was already D/Ced by night shift, but waiting in room for friend to pick her up. Pt went to bathroom and put on shoes that she believes had the chemical on them that she was allergic to. Pt began feeling SOB and coughing a lot. Notified Dr. Sharol Given. Readmitted pt. Placed on O2,( sats 100% on RA). Pt sitting up in bed with friend at bedside. Pt alert and oriented

## 2013-12-15 NOTE — ED Notes (Signed)
Patient was at work and had an allergic reaction to a chemical. Patient was complaining of oral swelling on scene and SOB. EMS gave IM epi, 35m of Benadryl, Zantac, 1261mof Solu-medrol and a breathing tx. After treatment patient began to have laryngeal spasms and couldn't breathe. EMS removed patient from environment and put NRB on and patient began to breathe on her own again. RA sat post tx is 93%

## 2013-12-15 NOTE — Discharge Instructions (Signed)
It is unclear what triggered your reaction today, try to avoid the air fresher that you came in contact with during your shift tonight.  Please followup with an allergist upon your earliest convenience.   Epinephrine Injection Epinephrine is a medicine given by injection to temporarily treat an emergency allergic reaction. It is also used to treat severe asthmatic attacks and other lung problems. The medicine helps to enlarge (dilate) the small breathing tubes of the lungs. A life-threatening, sudden allergic reaction that involves the whole body is called anaphylaxis. Because of potential side effects, epinephrine should only be used as directed by your caregiver. RISKS AND COMPLICATIONS Possible side effects of epinephrine injections include:  Chest pain.  Irregular or rapid heartbeat.  Shortness of breath.  Nausea.  Vomiting.  Abdominal pain or cramping.  Sweating.  Dizziness.  Weakness.  Headache.  Nervousness. Report all side effects to your caregiver. HOW TO GIVE AN EPINEPHRINE INJECTION Give the epinephrine injection immediately when symptoms of a severe reaction begin. Inject the medicine into the outer thigh or any available, large muscle. Your caregiver can teach you how to do this. You do not need to remove any clothing. After the injection, call your local emergency services (911 in [place] Even if you improve after the injection, you need to be examined at a hospital emergency department. Epinephrine works quickly, but it also wears off quickly. Delayed reactions can occur. A delayed reaction may be as serious and dangerous as the initial reaction. HOME CARE INSTRUCTIONS  Make sure you and your family know how to give an epinephrine injection.  Use epinephrine injections as directed by your caregiver. Do not use this medicine more often or in larger doses than prescribed.  Always carry your epinephrine injection or anaphylaxis kit with you. This can be lifesaving if  you have a severe reaction.  Store the medicine in a cool, dry place. If the medicine becomes discolored or cloudy, dispose of it properly and replace it with new medicine.  Check the expiration date on your medicine. It may be unsafe to use medicines past their expiration date.  Tell your caregiver about any other medicines you are taking. Some medicines can react badly with epinephrine.  Tell your caregiver about any medical conditions you have, such as diabetes, high blood pressure (hypertension), heart disease, irregular heartbeats, or if you are pregnant. SEEK IMMEDIATE MEDICAL CARE IF:  You have used an epinephrine injection. Call your local emergency services (911 in [place] Even if you improve after the injection, you need to be examined at a hospital emergency department to make sure your allergic reaction is under control. You will also be monitored for adverse effects from the medicine.  You have chest pain.  You have irregular or fast heartbeats.  You have shortness of breath.  You have severe headaches.  You have severe nausea, vomiting, or abdominal cramps.  You have severe pain, swelling, or redness in the area where you gave the injection. Document Released: 09/18/2000 Document Revised: 12/14/2011 Document Reviewed: 06/10/2011 [facility] Patient Information 2014 [place], [place].  Allergies Allergies may happen from anything your body is sensitive to. This may be food, medicines, pollens, chemicals, and nearly anything around you in everyday life that produces allergens. An allergen is anything that causes an allergy producing substance. Heredity is often a factor in causing these problems. This means you may have some of the same allergies as your parents. Food allergies happen in all age groups. Food allergies are some of the  most severe and life threatening. Some common food allergies are cow's milk, seafood, eggs, nuts, wheat, and soybeans. SYMPTOMS   Swelling around  the mouth.  An itchy red rash or hives.  Vomiting or diarrhea.  Difficulty breathing. SEVERE ALLERGIC REACTIONS ARE LIFE-THREATENING. This reaction is called anaphylaxis. It can cause the mouth and throat to swell and cause difficulty with breathing and swallowing. In severe reactions only a trace amount of food (for example, peanut oil in a salad) may cause death within seconds. Seasonal allergies occur in all age groups. These are seasonal because they usually occur during the same season every year. They may be a reaction to molds, grass pollens, or tree pollens. Other causes of problems are house dust mite allergens, pet dander, and mold spores. The symptoms often consist of nasal congestion, a runny itchy nose associated with sneezing, and tearing itchy eyes. There is often an associated itching of the mouth and ears. The problems happen when you come in contact with pollens and other allergens. Allergens are the particles in the air that the body reacts to with an allergic reaction. This causes you to release allergic antibodies. Through a chain of events, these eventually cause you to release histamine into the blood stream. Although it is meant to be protective to the body, it is this release that causes your discomfort. This is why you were given anti-histamines to feel better. If you are unable to pinpoint the offending allergen, it may be determined by skin or blood testing. Allergies cannot be cured but can be controlled with medicine. Hay fever is a collection of all or some of the seasonal allergy problems. It may often be treated with simple over-the-counter medicine such as diphenhydramine. Take medicine as directed. Do not drink alcohol or drive while taking this medicine. Check with your caregiver or package insert for child dosages. If these medicines are not effective, there are many new medicines your caregiver can prescribe. Stronger medicine such as nasal spray, eye drops, and  corticosteroids may be used if the first things you try do not work well. Other treatments such as immunotherapy or desensitizing injections can be used if all else fails. Follow up with your caregiver if problems continue. These seasonal allergies are usually not life threatening. They are generally more of a nuisance that can often be handled using medicine. HOME CARE INSTRUCTIONS   If unsure what causes a reaction, keep a diary of foods eaten and symptoms that follow. Avoid foods that cause reactions.  If hives or rash are present:  Take medicine as directed.  You may use an over-the-counter antihistamine (diphenhydramine) for hives and itching as needed.  Apply cold compresses (cloths) to the skin or take baths in cool water. Avoid hot baths or showers. Heat will make a rash and itching worse.  If you are severely allergic:  Following a treatment for a severe reaction, hospitalization is often required for closer follow-up.  Wear a medic-alert bracelet or necklace stating the allergy.  You and your family must learn how to give adrenaline or use an anaphylaxis kit.  If you have had a severe reaction, always carry your anaphylaxis kit or EpiPen with you. Use this medicine as directed by your caregiver if a severe reaction is occurring. Failure to do so could have a fatal outcome. SEEK MEDICAL CARE IF:  You suspect a food allergy. Symptoms generally happen within 30 minutes of eating a food.  Your symptoms have not gone away within 2 days  or are getting worse.  You develop new symptoms.  You want to retest yourself or your child with a food or drink you think causes an allergic reaction. Never do this if an anaphylactic reaction to that food or drink has happened before. Only do this under the care of a caregiver. SEEK IMMEDIATE MEDICAL CARE IF:   You have difficulty breathing, are wheezing, or have a tight feeling in your chest or throat.  You have a swollen mouth, or you have  hives, swelling, or itching all over your body.  You have had a severe reaction that has responded to your anaphylaxis kit or an EpiPen. These reactions may return when the medicine has worn off. These reactions should be considered life threatening. MAKE SURE YOU:   Understand these instructions.  Will watch your condition.  Will get help right away if you are not doing well or get worse. Document Released: 12/15/2002 Document Revised: 01/16/2013 Document Reviewed: 05/21/2008 [facility] Patient Information 2014 [place].

## 2013-12-24 ENCOUNTER — Encounter (HOSPITAL_COMMUNITY): Payer: Self-pay | Admitting: Emergency Medicine

## 2013-12-24 ENCOUNTER — Emergency Department (HOSPITAL_COMMUNITY)
Admission: EM | Admit: 2013-12-24 | Discharge: 2013-12-24 | Disposition: A | Discharge status: Home / Self Care | Payer: PRIVATE HEALTH INSURANCE | Attending: Emergency Medicine | Admitting: Emergency Medicine

## 2013-12-24 DIAGNOSIS — Z79899 Other long term (current) drug therapy: Secondary | ICD-10-CM | POA: Insufficient documentation

## 2013-12-24 DIAGNOSIS — R062 Wheezing: Secondary | ICD-10-CM | POA: Insufficient documentation

## 2013-12-24 DIAGNOSIS — T7840XA Allergy, unspecified, initial encounter: Secondary | ICD-10-CM

## 2013-12-24 DIAGNOSIS — E669 Obesity, unspecified: Secondary | ICD-10-CM | POA: Insufficient documentation

## 2013-12-24 DIAGNOSIS — IMO0002 Reserved for concepts with insufficient information to code with codable children: Secondary | ICD-10-CM | POA: Insufficient documentation

## 2013-12-24 DIAGNOSIS — K219 Gastro-esophageal reflux disease without esophagitis: Secondary | ICD-10-CM | POA: Insufficient documentation

## 2013-12-24 DIAGNOSIS — T498X5A Adverse effect of other topical agents, initial encounter: Secondary | ICD-10-CM | POA: Insufficient documentation

## 2013-12-24 MED ORDER — ALBUTEROL SULFATE HFA 108 (90 BASE) MCG/ACT IN AERS
2.0000 | INHALATION_SPRAY | RESPIRATORY_TRACT | Status: DC | PRN
  Administered 2013-12-24: 2 via RESPIRATORY_TRACT
  Filled 2013-12-24: qty 6.7

## 2013-12-24 MED ORDER — METHYLPREDNISOLONE SODIUM SUCC 125 MG IJ SOLR
125.0000 mg | Freq: Once | INTRAMUSCULAR | Status: AC
  Administered 2013-12-24: 125 mg via INTRAVENOUS
  Filled 2013-12-24: qty 2

## 2013-12-24 MED ORDER — METHYLPREDNISOLONE SODIUM SUCC 125 MG IJ SOLR: 125.0000 mg | Freq: Once | INTRAMUSCULAR | Status: AC

## 2013-12-24 MED ORDER — PREDNISONE 20 MG PO TABS: ORAL_TABLET | ORAL | Status: DC

## 2013-12-24 MED ORDER — ALBUTEROL SULFATE (2.5 MG/3ML) 0.083% IN NEBU
INHALATION_SOLUTION | RESPIRATORY_TRACT | Status: AC
  Administered 2013-12-24: 2.5 mg
  Filled 2013-12-24: qty 3

## 2013-12-24 MED ORDER — ALBUTEROL SULFATE (2.5 MG/3ML) 0.083% IN NEBU: 5.0000 mg | INHALATION_SOLUTION | Freq: Once | RESPIRATORY_TRACT | Status: AC

## 2013-12-24 NOTE — ED Notes (Signed)
Pt allergic to lemon grass, exposed to fragrance on floor. Pt coughing, feels lips and mouth itching, pt took 2 benadryl on way down.

## 2013-12-24 NOTE — ED Provider Notes (Signed)
CSN: [id number]     Arrival date & time 12/24/13  1327 History  This chart was scribed for [name] Diego, MD by Marcha Dutton, ED Scribe. This patient was seen in room APA06/[facility]06 and the patient's care was started at 1:32 PM.    Chief Complaint  Patient presents with  . Allergic Reaction      Patient is a 48 y.o. female presenting with allergic reaction. No language interpreter was used.  Allergic Reaction Presenting symptoms: itching (Throat) and wheezing (mild)   Presenting symptoms: no rash   Severity:  Mild Prior allergic episodes:  Plant allergies Context: cosmetics   Relieved by:  Antihistamines Worsened by:  Nothing tried Ineffective treatments:  None tried Pt states she took 50 mg benedryl. She states her throat is itchy and irritated and her tongue feels "slick."   Past Medical History  Diagnosis Date  . Chest pain     cath 2005 norm cors, repeat cath Feb 2011 normal cors and only minimally  elevated pulmonary pressures  . Palpitations   . Obesity   . Hiatal hernia   . Sleep apnea sleep study 11/03/2009    mild-no cpap  . GERD (gastroesophageal reflux disease)   . Crohn disease     Past Surgical History  Procedure Laterality Date  . Cesarean section    . Abdominal hysterectomy    . Shoulder surgery      right x 2   . Cholecystectomy  09/08/2006    lap. chole.  . Inguinal hernia repair  10/30/2008    right  . Ankle arthroscopy  06/01/2012    Procedure: ANKLE ARTHROSCOPY;  Surgeon: Colin Rhein, MD;  Location: [place] [facility];  Service: Orthopedics;  Laterality: Left;  left ankle arthorsocpy with extensive debridement and gastroc slide  . Cardiac catheterization  11/22/2009 and 2005    WNL  . Chondroplasty  06/17/2012    Procedure: CHONDROPLASTY;  Surgeon: Carole Civil, MD;  Location: [facility];  Service: Orthopedics;  Laterality: Right;  right patella    Family History  Problem Relation Age of Onset  . Hypertension Mother   . Lupus  Father   . COPD Father     History  Substance Use Topics  . Smoking status: Never Smoker   . Smokeless tobacco: Never Used  . Alcohol Use: No    OB History   Grav Para Term Preterm Abortions TAB SAB Ect Mult Living                  Review of Systems  Constitutional: Negative for appetite change and fatigue.  HENT: Negative for congestion, ear discharge and sinus pressure.   Eyes: Negative for discharge.  Respiratory: Positive for wheezing (mild). Negative for cough.   Cardiovascular: Negative for chest pain.  Gastrointestinal: Negative for abdominal pain and diarrhea.  Genitourinary: Negative for frequency and hematuria.  Musculoskeletal: Negative for back pain.  Skin: Positive for itching (Throat). Negative for rash.  Neurological: Negative for seizures and headaches.  Psychiatric/Behavioral: Negative for hallucinations.      Allergies  Other; Wasp venom; Hydromorphone hcl; Omnipaque; Pollen extract; and Adhesive  Home Medications   Current Outpatient Rx  Name  Route  Sig  Dispense  Refill  . acetaminophen (TYLENOL) 500 MG tablet   Oral   Take 1,500 mg by mouth daily as needed for mild pain.         Marland Kitchen albuterol (PROVENTIL) (2.5 MG/3ML) 0.083% nebulizer solution   Nebulization  Take 2.5 mg by nebulization every 6 (six) hours as needed. For shortness of breath         . benzonatate (TESSALON) 200 MG capsule   Oral   Take 1 capsule (200 mg total) by mouth 3 (three) times daily as needed for cough.   20 capsule   0   . budesonide (ENTOCORT EC) 3 MG 24 hr capsule   Oral   Take 3 mg by mouth daily as needed. Crohn's Disease Flare-Up         . cetirizine (ZYRTEC) 10 MG tablet   Oral   Take 10 mg by mouth daily as needed. For Allergies         . cyclobenzaprine (FLEXERIL) 10 MG tablet   Oral   Take 1 tablet (10 mg total) by mouth 3 (three) times daily as needed. For muscle spasms   40 tablet   2   . dicyclomine (BENTYL) 10 MG capsule   Oral    Take 1 capsule (10 mg total) by mouth 3 (three) times daily before meals.   90 capsule   5   . diphenhydrAMINE (BENADRYL) 25 MG tablet   Oral   Take 1 tablet (25 mg total) by mouth every 6 (six) hours.   20 tablet   0   . EPINEPHrine (EPIPEN) 0.3 mg/0.3 mL SOAJ injection   Intramuscular   Inject 0.3 mLs (0.3 mg total) into the muscle once.   1 Device   0   . esomeprazole (NEXIUM) 40 MG capsule   Oral   Take 40 mg by mouth as needed.          . famotidine (PEPCID) 20 MG tablet   Oral   Take 1 tablet (20 mg total) by mouth 2 (two) times daily.   30 tablet   0   . hyoscyamine (ANASPAZ) 0.125 MG TBDP   Sublingual   Place 0.125 mg under the tongue 4 (four) times daily as needed. For GI         . ondansetron (ZOFRAN) 4 MG tablet   Oral   Take 1 tablet (4 mg total) by mouth every 8 (eight) hours as needed for nausea or vomiting.   30 tablet   0   . polyethylene glycol powder (GLYCOLAX/MIRALAX) powder   Oral   Take 17 g by mouth daily.   527 g   3   . predniSONE (DELTASONE) 20 MG tablet   Oral   Take 3 tablets (60 mg total) by mouth daily.   15 tablet   0    Triage Vitals: BP 147/115  Pulse 101  Temp(Src) 98.2 F (36.8 C) (Oral)  Resp 25  Ht 5' 10"  (1.778 m)  Wt 251 lb (113.853 kg)  BMI 36.01 kg/m2  SpO2 94%  Physical Exam  Nursing note and vitals reviewed. Constitutional: She is oriented to person, place, and time. She appears well-developed.  HENT:  Head: Normocephalic.  Eyes: Conjunctivae and EOM are normal. No scleral icterus.  Neck: Neck supple. No thyromegaly present.  Cardiovascular: Normal rate and regular rhythm.  Exam reveals no gallop and no friction rub.   No murmur heard. Pulmonary/Chest: No stridor. She has wheezes (minimal). She has no rales. She exhibits no tenderness.  Abdominal: She exhibits no distension. There is no tenderness. There is no rebound.  Musculoskeletal: Normal range of motion. She exhibits no edema.  Lymphadenopathy:     She has no cervical adenopathy.  Neurological: She is oriented to person,  place, and time. She exhibits normal muscle tone. Coordination normal.  Skin: No rash noted. No erythema.  Psychiatric: She has a normal mood and affect. Her behavior is normal.    ED Course  Procedures (including critical care time)  DIAGNOSTIC STUDIES: Oxygen Saturation is 94% on RA, low by my interpretation.    COORDINATION OF CARE: 1:34 PM- Pt advised of plan for treatment and pt agrees.    Labs Review Labs Reviewed - No data to display Imaging Review No results found.   EKG Interpretation None      MDM   Final diagnoses:  None   The chart was scribed for me under my direct supervision.  I personally performed the history, physical, and medical decision making and all procedures in the evaluation of this patient.[name] Diego, MD 12/24/13 [phone]

## 2013-12-24 NOTE — ED Notes (Signed)
Pt alert & oriented x4, stable gait. Patient given discharge instructions, paperwork & prescription(s). Patient  instructed to stop at the registration desk to finish any additional paperwork. Patient verbalized understanding. Pt left department w/ no further questions. 

## 2013-12-24 NOTE — Discharge Instructions (Signed)
Rest at home today.  Follow up with your md if any problems

## 2014-02-22 ENCOUNTER — Ambulatory Visit (HOSPITAL_COMMUNITY)
Admission: RE | Admit: 2014-02-22 | Discharge: 2014-02-22 | Disposition: A | Discharge status: Home / Self Care | Payer: 59 | Source: Ambulatory Visit | Attending: Neurology | Admitting: Neurology

## 2014-02-22 ENCOUNTER — Other Ambulatory Visit: Payer: Self-pay | Admitting: Neurology

## 2014-02-22 DIAGNOSIS — R519 Headache, unspecified: Secondary | ICD-10-CM

## 2014-02-22 DIAGNOSIS — R42 Dizziness and giddiness: Secondary | ICD-10-CM | POA: Insufficient documentation

## 2014-02-22 DIAGNOSIS — R51 Headache: Principal | ICD-10-CM

## 2014-02-22 DIAGNOSIS — M5020 Other cervical disc displacement, unspecified cervical region: Secondary | ICD-10-CM | POA: Insufficient documentation

## 2014-02-22 DIAGNOSIS — M62838 Other muscle spasm: Secondary | ICD-10-CM

## 2014-02-22 DIAGNOSIS — M5380 Other specified dorsopathies, site unspecified: Secondary | ICD-10-CM | POA: Insufficient documentation

## 2014-02-22 DIAGNOSIS — R209 Unspecified disturbances of skin sensation: Secondary | ICD-10-CM | POA: Insufficient documentation

## 2014-02-22 DIAGNOSIS — M5030 Other cervical disc degeneration, unspecified cervical region: Secondary | ICD-10-CM | POA: Insufficient documentation

## 2014-02-23 ENCOUNTER — Ambulatory Visit (HOSPITAL_COMMUNITY): Payer: 59

## 2014-04-21 ENCOUNTER — Ambulatory Visit (HOSPITAL_COMMUNITY)
Admission: RE | Admit: 2014-04-21 | Discharge: 2014-04-21 | Disposition: A | Discharge status: Home / Self Care | Payer: 59 | Source: Ambulatory Visit | Attending: Family Medicine | Admitting: Family Medicine

## 2014-04-21 ENCOUNTER — Other Ambulatory Visit (HOSPITAL_COMMUNITY): Payer: Self-pay | Admitting: Family Medicine

## 2014-04-21 DIAGNOSIS — M25569 Pain in unspecified knee: Secondary | ICD-10-CM | POA: Insufficient documentation

## 2014-04-21 DIAGNOSIS — M25561 Pain in right knee: Secondary | ICD-10-CM

## 2014-04-21 DIAGNOSIS — M159 Polyosteoarthritis, unspecified: Secondary | ICD-10-CM | POA: Insufficient documentation

## 2014-04-21 DIAGNOSIS — M25469 Effusion, unspecified knee: Secondary | ICD-10-CM | POA: Insufficient documentation

## 2014-04-23 ENCOUNTER — Other Ambulatory Visit (HOSPITAL_COMMUNITY): Payer: Self-pay | Admitting: Family Medicine

## 2014-04-23 DIAGNOSIS — M25561 Pain in right knee: Secondary | ICD-10-CM

## 2014-04-24 ENCOUNTER — Encounter: Payer: Self-pay | Admitting: Orthopedic Surgery

## 2014-04-24 ENCOUNTER — Ambulatory Visit (INDEPENDENT_AMBULATORY_CARE_PROVIDER_SITE_OTHER): Payer: 59 | Admitting: Orthopedic Surgery

## 2014-04-24 ENCOUNTER — Encounter (INDEPENDENT_AMBULATORY_CARE_PROVIDER_SITE_OTHER): Payer: Self-pay

## 2014-04-24 VITALS — BP 150/90 | Ht 70.0 in | Wt 251.0 lb

## 2014-04-24 DIAGNOSIS — S76111A Strain of right quadriceps muscle, fascia and tendon, initial encounter: Secondary | ICD-10-CM

## 2014-04-24 DIAGNOSIS — IMO0002 Reserved for concepts with insufficient information to code with codable children: Secondary | ICD-10-CM

## 2014-04-24 MED ORDER — HYDROCODONE-ACETAMINOPHEN 7.5-325 MG PO TABS: 1.0000 | ORAL_TABLET | Freq: Four times a day (QID) | ORAL | Status: DC | PRN

## 2014-04-24 NOTE — Progress Notes (Signed)
Subjective:     Patient ID: [name], female   DOB: 1965-12-02, 48 y.o.   MRN: [id number]  Knee Pain    right knee pain  48 year old female on vacation in [place] slipped off of some type of flotation device sustained hyperflexion injury right knee complains of suprapatellar pain in the quadriceps tendon associated with swelling. Some of the swelling has resolved she still has pain in that area some occasional giving way symptoms without catching or locking.   Review of Systems Musculoskeletal system as described no vascular symptoms although she thought she may have a blood clot she did go to the ER had x-rays that were negative. She has no sensory changes or vascular changes.    Objective:   Physical Exam Vital signs: BP 150/90  Ht 5' 10"  (1.778 m)  Wt 251 lb (113.853 kg)  BMI 36.01 kg/m2   General the patient is well-developed and well-nourished grooming and hygiene are normal Oriented x3 Mood and affect normal Ambulation normal gait pattern at this point Inspection of the right knee shows tenderness at the quadriceps without palpable defect is full painful passive range of motion of the right knee with ligament stable. She has good straight leg raise with weakness to manual muscle testing skin clean dry and intact pulses intact sensation normal opposite knee reveals normal stability strength motion and inspection  Cardiovascular exam is normal Sensory exam normal     Assessment:     I reviewed the hospital x-rays they were normal except for arthritis small effusion no acute fracture    Plan:     Encounter Diagnosis  Name Primary?  . Quadriceps muscle strain, right, initial encounter Yes    Recommend brace Ice Meds ordered this encounter  Medications  . oxyCODONE-acetaminophen (PERCOCET/ROXICET) 5-325 MG per tablet    Sig: Take by mouth every 4 (four) hours as needed for severe pain.  Marland Kitchen ibuprofen (ADVIL,MOTRIN) 800 MG tablet    Sig: Take 800 mg by mouth every  8 (eight) hours as needed.  Marland Kitchen HYDROcodone-acetaminophen (NORCO) 7.5-325 MG per tablet    Sig: Take 1 tablet by mouth every 6 (six) hours as needed for moderate pain.    Dispense:  56 tablet    Refill:  0   Recheck 6 weeks

## 2014-04-24 NOTE — Patient Instructions (Signed)
Ice 30 minutes 3-4 x a day  Wear brace 6 weeks   Activity as tolerated no running or Zumba etc

## 2014-05-01 ENCOUNTER — Encounter: Payer: Self-pay | Admitting: Orthopedic Surgery

## 2014-05-07 ENCOUNTER — Ambulatory Visit: Payer: Self-pay | Admitting: Orthopedic Surgery

## 2014-06-05 ENCOUNTER — Ambulatory Visit (INDEPENDENT_AMBULATORY_CARE_PROVIDER_SITE_OTHER): Payer: 59 | Admitting: Orthopedic Surgery

## 2014-06-05 ENCOUNTER — Encounter: Payer: Self-pay | Admitting: Orthopedic Surgery

## 2014-06-05 VITALS — BP 136/100 | Ht 70.0 in | Wt 251.0 lb

## 2014-06-05 DIAGNOSIS — M1711 Unilateral primary osteoarthritis, right knee: Secondary | ICD-10-CM

## 2014-06-05 DIAGNOSIS — Z9889 Other specified postprocedural states: Secondary | ICD-10-CM

## 2014-06-05 DIAGNOSIS — S76111A Strain of right quadriceps muscle, fascia and tendon, initial encounter: Secondary | ICD-10-CM

## 2014-06-05 DIAGNOSIS — M62838 Other muscle spasm: Secondary | ICD-10-CM

## 2014-06-05 DIAGNOSIS — IMO0002 Reserved for concepts with insufficient information to code with codable children: Secondary | ICD-10-CM

## 2014-06-05 DIAGNOSIS — M1710 Unilateral primary osteoarthritis, unspecified knee: Secondary | ICD-10-CM

## 2014-06-05 NOTE — Progress Notes (Signed)
Followup visit  Recheck right knee status post quadriceps strain. Patient was last seen 04/24/2014 she was in [place] on vacation she injured herself with a hyperflexion injury to the right knee had some suprapatellar pain in the quadriceps with swelling which has since resolved.  However she now complains of medial and anterior knee joint pain and some calf soreness she was concerned about a DVT but clinically she has no swelling negative Homans sign and she is very active and has no risk factors for such.  Review of systems unchanged from visit July 21  Exam BP 136/100  Ht 5' 10"  (1.778 m)  Wt 251 lb (113.853 kg)  BMI 36.01 kg/m2 BMI reveals slight to moderate Lahey overweight general appearance is normal oriented x3 mood excellent gait appears normal she is tender over the medial joint line and patellofemoral joint calf isn't tender as well full range of motion and stability are confirmed by testing motor exam is normal including manual muscle testing of the quadriceps can't intact pulses are good normal sensation no peripheral edema  Impression after review of her previous MRI prior to her arthroscopy and review of her most recent x-ray she has patellofemoral and medial compartment gonarthrosis and I think the recent has exacerbated that disease process  Recommend cortisone injection  Return in 2 weeks if no improvement then we have to reconsider arthroscopic surgery again   Injection RIGHT knee Verbal consent and timeout were completed for a RIGHT injection Under sterile conditions the RIGHT knee was injected from a lateral approach.  Medication depomedrol 40 mg and Lidocaine plain 3 cc   A sterile dressing was applied there were no complications

## 2014-06-05 NOTE — Patient Instructions (Signed)
Joint Injection Care After Refer to this sheet in the next few days. These instructions provide you with information on caring for yourself after you have had a joint injection. Your caregiver also may give you more specific instructions. Your treatment has been planned according to current medical practices, but problems sometimes occur. Call your caregiver if you have any problems or questions after your procedure. After any type of joint injection, it is not uncommon to experience:  Soreness, swelling, or bruising around the injection site.  Mild numbness, tingling, or weakness around the injection site caused by the numbing medicine used before or with the injection. It also is possible to experience the following effects associated with the specific agent after injection:  Iodine-based contrast agents:  Allergic reaction (itching, hives, widespread redness, and swelling beyond the injection site).  Corticosteroids (These effects are rare.):  Allergic reaction.  Increased blood sugar levels (If you have diabetes and you notice that your blood sugar levels have increased, notify your caregiver).  Increased blood pressure levels.  Mood swings.  Hyaluronic acid in the use of viscosupplementation.  Temporary heat or redness.  Temporary rash and itching.  Increased fluid accumulation in the injected joint. These effects all should resolve within a day after your procedure.  HOME CARE INSTRUCTIONS  Limit yourself to light activity the day of your procedure. Avoid lifting heavy objects, bending, stooping, or twisting.  Take prescription or over-the-counter pain medication as directed by your caregiver.  You may apply ice to your injection site to reduce pain and swelling the day of your procedure. Ice may be applied 03-04 times:  Put ice in a plastic bag.  Place a towel between your skin and the bag.  Leave the ice on for no longer than 15-20 minutes each time. SEEK  IMMEDIATE MEDICAL CARE IF:   Pain and swelling get worse rather than better or extend beyond the injection site.  Numbness does not go away.  Blood or fluid continues to leak from the injection site.  You have chest pain.  You have swelling of your face or tongue.  You have trouble breathing or you become dizzy.  You develop a fever, chills, or severe tenderness at the injection site that last longer than 1 day. MAKE SURE YOU:  Understand these instructions.  Watch your condition.  Get help right away if you are not doing well or if you get worse. Document Released: 06/04/2011 Document Revised: 12/14/2011 Document Reviewed: 06/04/2011 [facility] Patient Information 2015 [place], [place]. This information is not intended to replace advice given to you by your health care provider. Make sure you discuss any questions you have with your health care provider.

## 2014-06-21 ENCOUNTER — Ambulatory Visit (INDEPENDENT_AMBULATORY_CARE_PROVIDER_SITE_OTHER): Payer: 59 | Admitting: Orthopedic Surgery

## 2014-06-21 VITALS — BP 144/93 | Ht 70.0 in | Wt 239.0 lb

## 2014-06-21 DIAGNOSIS — M1710 Unilateral primary osteoarthritis, unspecified knee: Secondary | ICD-10-CM

## 2014-06-21 DIAGNOSIS — M1711 Unilateral primary osteoarthritis, right knee: Secondary | ICD-10-CM

## 2014-06-21 MED ORDER — TRAMADOL HCL 50 MG PO TABS: 50.0000 mg | ORAL_TABLET | Freq: Four times a day (QID) | ORAL | Status: DC | PRN

## 2014-06-21 NOTE — Progress Notes (Signed)
Chief Complaint  Patient presents with  . Follow-up    follow up right knee s/p injection   Status post traumatic injury to right lower extremity/knee joint quadriceps. After intra-articular injection she still has medial knee pain and some lateral knee pain but she is improving. She's regained full range of motion she has a normal straight leg raising quad extension test. Her knee is stable. There is no effusion. Scans intact the distal pulses normal sensation. She is awake alert and oriented x3 vital signs are stable.   Review of systems occasional giving way symptoms in the right knee.   Recommend continue bracing, Voltaren gel, tramadol for pain she cannot take anti-inflammatory secondary to a device from the GI specialist regarding her Crohn's disease  Followup 6 months check both knees.

## 2014-06-21 NOTE — Patient Instructions (Signed)
Tramadol for pain continue voltaren gel

## 2014-07-17 ENCOUNTER — Encounter (INDEPENDENT_AMBULATORY_CARE_PROVIDER_SITE_OTHER): Payer: Self-pay | Admitting: Internal Medicine

## 2014-07-17 ENCOUNTER — Ambulatory Visit (INDEPENDENT_AMBULATORY_CARE_PROVIDER_SITE_OTHER): Payer: 59 | Admitting: Internal Medicine

## 2014-07-17 VITALS — BP 128/100 | HR 72 | Temp 97.9°F | Ht 72.0 in | Wt 241.6 lb

## 2014-07-17 DIAGNOSIS — K5732 Diverticulitis of large intestine without perforation or abscess without bleeding: Secondary | ICD-10-CM

## 2014-07-17 DIAGNOSIS — R110 Nausea: Secondary | ICD-10-CM

## 2014-07-17 DIAGNOSIS — K5090 Crohn's disease, unspecified, without complications: Secondary | ICD-10-CM | POA: Insufficient documentation

## 2014-07-17 DIAGNOSIS — B379 Candidiasis, unspecified: Secondary | ICD-10-CM

## 2014-07-17 DIAGNOSIS — K50919 Crohn's disease, unspecified, with unspecified complications: Secondary | ICD-10-CM

## 2014-07-17 HISTORY — DX: Diverticulitis of large intestine without perforation or abscess without bleeding: K57.32

## 2014-07-17 LAB — CBC WITH DIFFERENTIAL/PLATELET
BASOS PCT: 1 % (ref 0–1)
Basophils Absolute: 0 10*3/uL (ref 0.0–0.1)
Eosinophils Absolute: 0.1 10*3/uL (ref 0.0–0.7)
Eosinophils Relative: 3 % (ref 0–5)
HCT: 37.8 % (ref 36.0–46.0)
HEMOGLOBIN: 12.8 g/dL (ref 12.0–15.0)
LYMPHS ABS: 1.4 10*3/uL (ref 0.7–4.0)
Lymphocytes Relative: 32 % (ref 12–46)
MCH: 28.5 pg (ref 26.0–34.0)
MCHC: 33.9 g/dL (ref 30.0–36.0)
MCV: 84.2 fL (ref 78.0–100.0)
Monocytes Absolute: 0.4 10*3/uL (ref 0.1–1.0)
Monocytes Relative: 8 % (ref 3–12)
NEUTROS ABS: 2.5 10*3/uL (ref 1.7–7.7)
NEUTROS PCT: 56 % (ref 43–77)
Platelets: 362 10*3/uL (ref 150–400)
RBC: 4.49 MIL/uL (ref 3.87–5.11)
RDW: 14.7 % (ref 11.5–15.5)
WBC: 4.4 10*3/uL (ref 4.0–10.5)

## 2014-07-17 LAB — COMPREHENSIVE METABOLIC PANEL
ALK PHOS: 48 U/L (ref 39–117)
ALT: 15 U/L (ref 0–35)
AST: 14 U/L (ref 0–37)
Albumin: 4 g/dL (ref 3.5–5.2)
BILIRUBIN TOTAL: 0.7 mg/dL (ref 0.2–1.2)
BUN: 12 mg/dL (ref 6–23)
CO2: 31 mEq/L (ref 19–32)
Calcium: 9.2 mg/dL (ref 8.4–10.5)
Chloride: 105 mEq/L (ref 96–112)
Creat: 0.84 mg/dL (ref 0.50–1.10)
GLUCOSE: 97 mg/dL (ref 70–99)
Potassium: 4.4 mEq/L (ref 3.5–5.3)
SODIUM: 141 meq/L (ref 135–145)
TOTAL PROTEIN: 6.8 g/dL (ref 6.0–8.3)

## 2014-07-17 LAB — C-REACTIVE PROTEIN: CRP: <0.5 mg/dL (ref <0.60)

## 2014-07-17 MED ORDER — CIPROFLOXACIN HCL 500 MG PO TABS: 500.0000 mg | ORAL_TABLET | Freq: Two times a day (BID) | ORAL | Status: DC

## 2014-07-17 MED ORDER — METRONIDAZOLE 250 MG PO TABS: 500.0000 mg | ORAL_TABLET | Freq: Two times a day (BID) | ORAL | Status: DC

## 2014-07-17 MED ORDER — FLUCONAZOLE 100 MG PO TABS: 100.0000 mg | ORAL_TABLET | Freq: Every day | ORAL | Status: DC

## 2014-07-17 MED ORDER — ONDANSETRON HCL 4 MG PO TABS: 4.0000 mg | ORAL_TABLET | Freq: Three times a day (TID) | ORAL | Status: DC | PRN

## 2014-07-17 NOTE — Patient Instructions (Signed)
Will empirically treat for Diverticulitis. PR next week.

## 2014-07-17 NOTE — Progress Notes (Signed)
Subjective:    Patient ID: [name], female    DOB: Feb 20, 1966, 48 y.o.   MRN: [id number]  HPI  48 yr old female who was diagnosed with ileal Crohn's disease in January 2012 and was treated with Budesomide.   Last week she had nausea,LLQ pain and chills.   She had upper left quadrant pain. Her symptoms started last Monday or Tuesday.   She says she feels weak. She says she went to the [facility] and had BM 1 week ago and saw a spot of blood. She says she saw blood on the tissue the next day also. No blood in the stool. She had a low grade fever 2 days ago. She thinks she may have had a fever last week but she did not check. The pain in her LLQ has eased. Yesterday she had pressure in her esophagus. She says she has slight tenderness rt lower abdomen.  She is 20% better. She is having 3-5 stools a day which is normal for her.    08/08/2014 CT abdomen/pelvis with CM: LLQ pain: 1. Sigmoid colon diverticula are noted. In axial image 64 there is  mild thickening of diverticular colonic wall in left lower quadrant  and mild stranding of pericolonic fat. This is confirmed on sagittal  image 90. Findings are consistent with mild diverticulitis. No  diverticular abscess is noted.  2. No hydronephrosis or hydroureter.  3. Status postcholecystectomy.  4. No nephrolithiasis.  5. No pericecal inflammation. Normal appendix is partially  visualized.  6. Status post hysterectomy.    Prior GI workup;  EGD and colonoscopy in December 2011 revealing small sliding hiatal hernia erosive gastritis, Mallory-Weiss tear and colonoscopy revealed ileal erosions and pancolonic diverticulosis. Biopsy from terminal ileum reveals erosions but no granulomas.  Small bowel given capsule study in January 2012 revealed ileal erosions and 2 ulcers.  Small bowel follow-through in January 2012 was normal.  Abdominal/ pelvic CT with contrast on 01/02/2011 reveals colonic diverticulosis and small hiatal hernia but no  abnormality noted the small bowel.  Celiac antibody panel was negative in November 2011.  IBD panel in January 2012 was negative.   Review of Systems Past Medical History  Diagnosis Date  . Chest pain     cath 2005 norm cors, repeat cath Feb 2011 normal cors and only minimally  elevated pulmonary pressures  . Palpitations   . Obesity   . Hiatal hernia   . Sleep apnea sleep study 11/03/2009    mild-no cpap  . GERD (gastroesophageal reflux disease)   . Crohn disease     Past Surgical History  Procedure Laterality Date  . Cesarean section    . Abdominal hysterectomy    . Shoulder surgery      right x 2   . Cholecystectomy  09/08/2006    lap. chole.  . Inguinal hernia repair  10/30/2008    right  . Ankle arthroscopy  06/01/2012    Procedure: ANKLE ARTHROSCOPY;  Surgeon: Colin Rhein, MD;  Location: [facility];  Service: Orthopedics;  Laterality: Left;  left ankle arthorsocpy with extensive debridement and gastroc slide  . Cardiac catheterization  11/22/2009 and 2005    WNL  . Chondroplasty  06/17/2012    Procedure: CHONDROPLASTY;  Surgeon: Carole Civil, MD;  Location: [facility];  Service: Orthopedics;  Laterality: Right;  right patella    Allergies  Allergen Reactions  . Other Shortness Of Breath    Lemon grass  . Wasp  Venom Anaphylaxis and Shortness Of Breath  . Hydromorphone Hcl Nausea And Vomiting  . Omnipaque [Iohexol] Hives and Nausea Only    Pt. Was premedicated with emergent protocol.  . Pollen Extract Swelling  . Adhesive [Tape] Rash    Current Outpatient Prescriptions on File Prior to Visit  Medication Sig Dispense Refill  . albuterol (PROVENTIL) (2.5 MG/3ML) 0.083% nebulizer solution Take 2.5 mg by nebulization every 6 (six) hours as needed. For shortness of breath      . benzonatate (TESSALON) 200 MG capsule Take 1 capsule (200 mg total) by mouth 3 (three) times daily as needed for cough.  20 capsule  0  . cetirizine (ZYRTEC) 10 MG tablet Take  10 mg by mouth daily as needed. For Allergies      . cyclobenzaprine (FLEXERIL) 10 MG tablet Take 1 tablet (10 mg total) by mouth 3 (three) times daily as needed. For muscle spasms  40 tablet  2  . diphenhydrAMINE (BENADRYL) 25 MG tablet Take 1 tablet (25 mg total) by mouth every 6 (six) hours.  20 tablet  0  . EPINEPHrine (EPIPEN) 0.3 mg/0.3 mL SOAJ injection Inject 0.3 mLs (0.3 mg total) into the muscle once.  1 Device  0  . HYDROcodone-acetaminophen (NORCO) 7.5-325 MG per tablet Take 1 tablet by mouth every 6 (six) hours as needed for moderate pain.  56 tablet  0  . dicyclomine (BENTYL) 10 MG capsule Take 1 capsule (10 mg total) by mouth 3 (three) times daily before meals.  90 capsule  5   No current facility-administered medications on file prior to visit.        Objective:   Physical Exam  Filed Vitals:   07/17/14 1057  BP: 128/100  Pulse: 72  Temp: 97.9 F (36.6 C)  Height: 6' (1.829 m)  Weight: 241 lb 9.6 oz (109.589 kg)  Alert and oriented. Skin warm and dry. Oral mucosa is moist.   . Sclera anicteric, conjunctivae is pink. Thyroid not enlarged. No cervical lymphadenopathy. Lungs clear. Heart regular rate and rhythm.  Abdomen is soft. Bowel sounds are positive. No hepatomegaly. No abdominal masses felt.  Rt lower quadrant tenderness (slight). No tenderness LLQ  No edema to lower extremities.           Assessment & Plan:  Possible diverticulitis. Possible Crohn's flare. She has not had any change in her stools except for spots of blood on the toilet tissue. Rx for Cipro and Flagyl x 14 days. CBC, CMET, and CRP.  She will call with a PR report Thursday.

## 2014-12-04 ENCOUNTER — Other Ambulatory Visit (HOSPITAL_COMMUNITY): Payer: Self-pay | Admitting: Family Medicine

## 2014-12-04 DIAGNOSIS — Z1231 Encounter for screening mammogram for malignant neoplasm of breast: Secondary | ICD-10-CM

## 2014-12-05 ENCOUNTER — Ambulatory Visit (HOSPITAL_COMMUNITY)
Admission: RE | Admit: 2014-12-05 | Discharge: 2014-12-05 | Disposition: A | Discharge status: Home / Self Care | Payer: 59 | Source: Ambulatory Visit | Attending: Family Medicine | Admitting: Family Medicine

## 2014-12-05 DIAGNOSIS — Z1231 Encounter for screening mammogram for malignant neoplasm of breast: Secondary | ICD-10-CM | POA: Diagnosis not present

## 2014-12-20 ENCOUNTER — Encounter: Payer: Self-pay | Admitting: Orthopedic Surgery

## 2014-12-20 ENCOUNTER — Ambulatory Visit (INDEPENDENT_AMBULATORY_CARE_PROVIDER_SITE_OTHER): Payer: 59 | Admitting: Orthopedic Surgery

## 2014-12-20 VITALS — BP 144/58 | Ht 72.0 in | Wt 244.6 lb

## 2014-12-20 DIAGNOSIS — M1711 Unilateral primary osteoarthritis, right knee: Secondary | ICD-10-CM

## 2014-12-20 NOTE — Progress Notes (Signed)
Established patient follow-up visit  Problem stable  Chief Complaint  Patient presents with  . Follow-up    follow up bilateral knees     She's not having any major problems right now. She does have some aches and pains when the weather changes or if she does a lot of stair climbing which: Sides with her right knee retropatellar cartilage issues of arthritis and chondromalacia   Knee flexion on the right 100 on the left 110 both knees are quiet   Follow-up 6 months

## 2015-04-30 ENCOUNTER — Telehealth (INDEPENDENT_AMBULATORY_CARE_PROVIDER_SITE_OTHER): Payer: Self-pay | Admitting: *Deleted

## 2015-04-30 DIAGNOSIS — G8929 Other chronic pain: Secondary | ICD-10-CM

## 2015-04-30 DIAGNOSIS — R1031 Right lower quadrant pain: Secondary | ICD-10-CM

## 2015-04-30 DIAGNOSIS — R1011 Right upper quadrant pain: Secondary | ICD-10-CM

## 2015-04-30 NOTE — Telephone Encounter (Signed)
C/o pain rt lower quadrant x 10 days. She has had a low grade fever. Tired and fatigued. Chils.  Am going to get a CBC with diff and sedrate. She says the pain is worse today. I advised her that if pain increased to go to the ED.

## 2015-04-30 NOTE — Telephone Encounter (Signed)
Thinks she is having a Chron's Flair. There are no available appointment at this time. Please return her call at [phone] or [phone].

## 2015-05-01 LAB — CBC WITH DIFFERENTIAL/PLATELET
Basophils Absolute: 0 10*3/uL (ref 0.0–0.1)
Basophils Relative: 0 % (ref 0–1)
EOS ABS: 0.1 10*3/uL (ref 0.0–0.7)
Eosinophils Relative: 2 % (ref 0–5)
HEMATOCRIT: 38 % (ref 36.0–46.0)
HEMOGLOBIN: 12.6 g/dL (ref 12.0–15.0)
LYMPHS ABS: 1.6 10*3/uL (ref 0.7–4.0)
Lymphocytes Relative: 31 % (ref 12–46)
MCH: 28 pg (ref 26.0–34.0)
MCHC: 33.2 g/dL (ref 30.0–36.0)
MCV: 84.4 fL (ref 78.0–100.0)
MONO ABS: 0.5 10*3/uL (ref 0.1–1.0)
MPV: 10 fL (ref 8.6–12.4)
Monocytes Relative: 9 % (ref 3–12)
NEUTROS ABS: 3.1 10*3/uL (ref 1.7–7.7)
NEUTROS PCT: 58 % (ref 43–77)
PLATELETS: 357 10*3/uL (ref 150–400)
RBC: 4.5 MIL/uL (ref 3.87–5.11)
RDW: 14.4 % (ref 11.5–15.5)
WBC: 5.3 10*3/uL (ref 4.0–10.5)

## 2015-05-02 ENCOUNTER — Telehealth (INDEPENDENT_AMBULATORY_CARE_PROVIDER_SITE_OTHER): Payer: Self-pay | Admitting: Internal Medicine

## 2015-05-02 ENCOUNTER — Encounter (INDEPENDENT_AMBULATORY_CARE_PROVIDER_SITE_OTHER): Payer: Self-pay | Admitting: Internal Medicine

## 2015-05-02 ENCOUNTER — Ambulatory Visit (INDEPENDENT_AMBULATORY_CARE_PROVIDER_SITE_OTHER): Payer: 59 | Admitting: Internal Medicine

## 2015-05-02 VITALS — BP 112/64 | HR 72 | Temp 97.9°F | Ht 71.5 in | Wt 246.6 lb

## 2015-05-02 DIAGNOSIS — R1031 Right lower quadrant pain: Secondary | ICD-10-CM

## 2015-05-02 DIAGNOSIS — G8929 Other chronic pain: Secondary | ICD-10-CM | POA: Diagnosis not present

## 2015-05-02 LAB — SEDIMENTATION RATE: SED RATE: 7 mm/h (ref 0–20)

## 2015-05-02 MED ORDER — ONDANSETRON HCL 4 MG PO TABS: 4.0000 mg | ORAL_TABLET | Freq: Three times a day (TID) | ORAL | Status: DC | PRN

## 2015-05-02 MED ORDER — DICYCLOMINE HCL 10 MG PO CAPS: 10.0000 mg | ORAL_CAPSULE | Freq: Two times a day (BID) | ORAL | Status: DC

## 2015-05-02 NOTE — Progress Notes (Addendum)
Subjective:    Patient ID: [name], female    DOB: [birth year], 49 y.o.   MRN: [id number]  HPI Presents today with c/o that 2 days ago she began to have rt lower abdominal pain. She also has upper mid abdominal pain. When she walks, she really notices the rt lower quadrant pain .She has had some nausea but no vomiting. Appetite has not been good. She does try to eat, but then she feels full.  She has not tried anything. There has been no fever. She usually has a BM 3-4 day and are formed which is normal for her. Today one stool and clay colored. She does not take any medications for her Crohn's.  Hx of ileal Crohn's since 2012. Patient is 49 year old female who was diagnosed with ileal Crohn's disease back in January 2012 and treated with budesonide. \  CBC    Component Value Date/Time   WBC 5.3 04/30/2015 1249   RBC 4.50 04/30/2015 1249   HGB 12.6 04/30/2015 1249   HCT 38.0 04/30/2015 1249   PLT 357 04/30/2015 1249   MCV 84.4 04/30/2015 1249   MCH 28.0 04/30/2015 1249   MCHC 33.2 04/30/2015 1249   RDW 14.4 04/30/2015 1249   LYMPHSABS 1.6 04/30/2015 1249   MONOABS 0.5 04/30/2015 1249   EOSABS 0.1 04/30/2015 1249   BASOSABS 0.0 04/30/2015 1249    Hepatic Function Latest Ref Rng 07/17/2014 08/08/2013 01/02/2011  Total Protein 6.0 - 8.3 g/dL 6.8 7.2 7.5  Albumin 3.5 - 5.2 g/dL 4.0 3.7 3.9  AST 0 - 37 U/L 14 15 33  ALT 0 - 35 U/L 15 13 34  Alk Phosphatase 39 - 117 U/L 48 58 55  Total Bilirubin 0.2 - 1.2 mg/dL 0.7 0.6 0.5  Bilirubin, Direct - - - -        Review of Systems Past Medical History  Diagnosis Date  . Chest pain     cath 2005 norm cors, repeat cath Feb 2011 normal cors and only minimally  elevated pulmonary pressures  . Palpitations   . Obesity   . Hiatal hernia   . Sleep apnea sleep study 11/03/2009    mild-no cpap  . GERD (gastroesophageal reflux disease)   . Crohn disease     Past Surgical History  Procedure Laterality Date  . Cesarean  section    . Abdominal hysterectomy    . Shoulder surgery      right x 2   . Cholecystectomy  09/08/2006    lap. chole.  . Inguinal hernia repair  10/30/2008    right  . Ankle arthroscopy  06/01/2012    Procedure: ANKLE ARTHROSCOPY;  Surgeon: Colin Rhein, MD;  Location: [place];  Service: Orthopedics;  Laterality: Left;  left ankle arthorsocpy with extensive debridement and gastroc slide  . Cardiac catheterization  11/22/2009 and 2005    WNL  . Chondroplasty  06/17/2012    Procedure: CHONDROPLASTY;  Surgeon: Carole Civil, MD;  Location: [facility];  Service: Orthopedics;  Laterality: Right;  right patella    Allergies  Allergen Reactions  . Other Shortness Of Breath    Lemon grass  . Wasp Venom Anaphylaxis and Shortness Of Breath  . Hydromorphone Hcl Nausea And Vomiting  . Omnipaque [Iohexol] Hives and Nausea Only    Pt. Was premedicated with emergent protocol.  . Pollen Extract Swelling  . Adhesive [Tape] Rash    Current Outpatient Prescriptions on File Prior to Visit  Medication  Sig Dispense Refill  . albuterol (PROVENTIL) (2.5 MG/3ML) 0.083% nebulizer solution Take 2.5 mg by nebulization every 6 (six) hours as needed. For shortness of breath    . cetirizine (ZYRTEC) 10 MG tablet Take 10 mg by mouth daily as needed. For Allergies    . cyclobenzaprine (FLEXERIL) 10 MG tablet Take 1 tablet (10 mg total) by mouth 3 (three) times daily as needed. For muscle spasms 40 tablet 2  . diphenhydrAMINE (BENADRYL) 25 MG tablet Take 1 tablet (25 mg total) by mouth every 6 (six) hours. 20 tablet 0  . EPINEPHrine (EPIPEN) 0.3 mg/0.3 mL SOAJ injection Inject 0.3 mLs (0.3 mg total) into the muscle once. 1 Device 0  . ondansetron (ZOFRAN) 4 MG tablet Take 1 tablet (4 mg total) by mouth every 8 (eight) hours as needed for nausea or vomiting. 30 tablet 1   No current facility-administered medications on file prior to visit.        Objective:   Physical Exam Blood pressure  112/64, pulse 72, temperature 97.9 F (36.6 C), height 5' 11.5" (1.816 m), weight 246 lb 9.6 oz (111.857 kg).  Alert and oriented. Skin warm and dry. Oral mucosa is moist.   . Sclera anicteric, conjunctivae is pink. Thyroid not enlarged. No cervical lymphadenopathy. Lungs clear. Heart regular rate and rhythm.  Abdomen is soft. Bowel sounds are positive. No hepatomegaly. No abdominal masses felt. Tenderness rt lower abdomen and across mid abdomen.  No edema to lower extremities.         Assessment & Plan:  Rt lower abdominal pain ? Etiology Crohn's vis diverticulitis.  I discussed with Dr. Laural Golden. MR entero abdomen/pelvis with CM. Bentyl 19m BID Zofran 475mas needed

## 2015-05-02 NOTE — Patient Instructions (Signed)
MR entero abdomen/pelvis. Rx for Bentyl and Zofran

## 2015-05-02 NOTE — Telephone Encounter (Signed)
error 

## 2015-05-03 ENCOUNTER — Ambulatory Visit (HOSPITAL_COMMUNITY)
Admission: RE | Admit: 2015-05-03 | Discharge: 2015-05-03 | Disposition: A | Discharge status: Home / Self Care | Payer: 59 | Source: Ambulatory Visit | Attending: Internal Medicine | Admitting: Internal Medicine

## 2015-05-03 DIAGNOSIS — K5090 Crohn's disease, unspecified, without complications: Secondary | ICD-10-CM | POA: Diagnosis not present

## 2015-05-03 DIAGNOSIS — G8929 Other chronic pain: Secondary | ICD-10-CM

## 2015-05-03 DIAGNOSIS — R110 Nausea: Secondary | ICD-10-CM | POA: Insufficient documentation

## 2015-05-03 DIAGNOSIS — R1031 Right lower quadrant pain: Secondary | ICD-10-CM | POA: Diagnosis not present

## 2015-05-03 MED ORDER — GADOBENATE DIMEGLUMINE 529 MG/ML IV SOLN
20.0000 mL | Freq: Once | INTRAVENOUS | Status: AC | PRN
  Administered 2015-05-03: 20 mL via INTRAVENOUS

## 2015-05-09 ENCOUNTER — Emergency Department (HOSPITAL_COMMUNITY)
Admission: EM | Admit: 2015-05-09 | Discharge: 2015-05-09 | Disposition: A | Discharge status: Home / Self Care | Payer: 59 | Attending: Emergency Medicine | Admitting: Emergency Medicine

## 2015-05-09 ENCOUNTER — Ambulatory Visit (INDEPENDENT_AMBULATORY_CARE_PROVIDER_SITE_OTHER): Payer: 59 | Admitting: Internal Medicine

## 2015-05-09 ENCOUNTER — Encounter (HOSPITAL_COMMUNITY): Payer: Self-pay

## 2015-05-09 ENCOUNTER — Emergency Department (HOSPITAL_COMMUNITY): Payer: 59

## 2015-05-09 ENCOUNTER — Encounter (INDEPENDENT_AMBULATORY_CARE_PROVIDER_SITE_OTHER): Payer: Self-pay | Admitting: Internal Medicine

## 2015-05-09 ENCOUNTER — Other Ambulatory Visit (INDEPENDENT_AMBULATORY_CARE_PROVIDER_SITE_OTHER): Payer: Self-pay | Admitting: *Deleted

## 2015-05-09 ENCOUNTER — Encounter (INDEPENDENT_AMBULATORY_CARE_PROVIDER_SITE_OTHER): Payer: Self-pay | Admitting: *Deleted

## 2015-05-09 VITALS — BP 108/100 | HR 76 | Temp 98.2°F | Ht 71.5 in | Wt 245.1 lb

## 2015-05-09 DIAGNOSIS — K2100 Gastro-esophageal reflux disease with esophagitis, without bleeding: Secondary | ICD-10-CM

## 2015-05-09 DIAGNOSIS — R1013 Epigastric pain: Secondary | ICD-10-CM | POA: Diagnosis not present

## 2015-05-09 DIAGNOSIS — R079 Chest pain, unspecified: Secondary | ICD-10-CM

## 2015-05-09 DIAGNOSIS — E669 Obesity, unspecified: Secondary | ICD-10-CM | POA: Diagnosis not present

## 2015-05-09 DIAGNOSIS — Z9889 Other specified postprocedural states: Secondary | ICD-10-CM | POA: Insufficient documentation

## 2015-05-09 DIAGNOSIS — Z79899 Other long term (current) drug therapy: Secondary | ICD-10-CM | POA: Insufficient documentation

## 2015-05-09 DIAGNOSIS — G8929 Other chronic pain: Secondary | ICD-10-CM

## 2015-05-09 LAB — BASIC METABOLIC PANEL
ANION GAP: 9 (ref 5–15)
BUN: 17 mg/dL (ref 6–20)
CO2: 27 mmol/L (ref 22–32)
CREATININE: 0.91 mg/dL (ref 0.44–1.00)
Calcium: 9.5 mg/dL (ref 8.9–10.3)
Chloride: 103 mmol/L (ref 101–111)
GFR calc non Af Amer: >60 mL/min (ref >60)
GLUCOSE: 109 mg/dL — AB (ref 65–99)
Potassium: 3.3 mmol/L — ABNORMAL LOW (ref 3.5–5.1)
Sodium: 139 mmol/L (ref 135–145)

## 2015-05-09 LAB — LIPASE, BLOOD: Lipase: 20 U/L — ABNORMAL LOW (ref 22–51)

## 2015-05-09 LAB — CBC WITH DIFFERENTIAL/PLATELET
BASOS PCT: 0 % (ref 0–1)
Basophils Absolute: 0 10*3/uL (ref 0.0–0.1)
Eosinophils Absolute: 0.2 10*3/uL (ref 0.0–0.7)
Eosinophils Relative: 3 % (ref 0–5)
HCT: 39.3 % (ref 36.0–46.0)
HEMOGLOBIN: 13 g/dL (ref 12.0–15.0)
Lymphocytes Relative: 37 % (ref 12–46)
Lymphs Abs: 2.3 10*3/uL (ref 0.7–4.0)
MCH: 28.4 pg (ref 26.0–34.0)
MCHC: 33.1 g/dL (ref 30.0–36.0)
MCV: 86 fL (ref 78.0–100.0)
MONO ABS: 0.5 10*3/uL (ref 0.1–1.0)
Monocytes Relative: 8 % (ref 3–12)
NEUTROS ABS: 3.2 10*3/uL (ref 1.7–7.7)
Neutrophils Relative %: 52 % (ref 43–77)
Platelets: 324 10*3/uL (ref 150–400)
RBC: 4.57 MIL/uL (ref 3.87–5.11)
RDW: 14.1 % (ref 11.5–15.5)
WBC: 6.2 10*3/uL (ref 4.0–10.5)

## 2015-05-09 LAB — TROPONIN I: Troponin I: <0.03 ng/mL (ref <0.031)

## 2015-05-09 MED ORDER — OMEPRAZOLE 20 MG PO CPDR: 20.0000 mg | DELAYED_RELEASE_CAPSULE | Freq: Every day | ORAL | Status: DC

## 2015-05-09 MED ORDER — PANTOPRAZOLE SODIUM 40 MG IV SOLR
40.0000 mg | INTRAVENOUS | Status: AC
  Administered 2015-05-09: 40 mg via INTRAVENOUS
  Filled 2015-05-09: qty 40

## 2015-05-09 MED ORDER — SODIUM CHLORIDE 0.9 % IV SOLN
Freq: Once | INTRAVENOUS | Status: AC
  Administered 2015-05-09: 01:00:00 via INTRAVENOUS

## 2015-05-09 MED ORDER — FAMOTIDINE IN NACL 20-0.9 MG/50ML-% IV SOLN
20.0000 mg | INTRAVENOUS | Status: AC
  Administered 2015-05-09: 20 mg via INTRAVENOUS
  Filled 2015-05-09: qty 50

## 2015-05-09 MED ORDER — FAMOTIDINE 20 MG PO TABS: 20.0000 mg | ORAL_TABLET | Freq: Two times a day (BID) | ORAL | Status: DC

## 2015-05-09 MED ORDER — SUCRALFATE 1 G PO TABS: 1.0000 g | ORAL_TABLET | Freq: Three times a day (TID) | ORAL | Status: DC

## 2015-05-09 MED ORDER — GI COCKTAIL ~~LOC~~
30.0000 mL | Freq: Once | ORAL | Status: AC
  Administered 2015-05-09: 30 mL via ORAL
  Filled 2015-05-09: qty 30

## 2015-05-09 NOTE — Progress Notes (Signed)
Subjective:    Patient ID: [name], female    DOB: May 01, 1966, 49 y.o.   MRN: [id number]  HPI   She was seen 05/02/2015 with c/o rt lower quadrant pain. She also had upper mid abdominal pain. She told me when she walked she would have rt lower quadrant pain. Some nausea but no vomiting. The pain in her rt lower quadrant is better.  Her appetite has not been good.  She has fullness. No fever. She usually has 3-4 BM daily and are formed.Hx of ileal Crohn's diagnosed in 2012. She is not on any medication for her Crohn's. On 7/28 she underwent an MRI entero of the abdomen/pelvis with CM for her rt lower quadrant pain which revealed:  IMPRESSION: 1. No evidence of active Crohn's disease. No mucosal abnormality of the small bowel. 2. Normal terminal ileum.  Today she presented to the ED with c/o constant radiating pressure in her chest and left side of neck. The pain started 3 hrs ago. The pain began in her  Epigastric region. She also c/o some RUQ abdominal pressure  x 1 weekt   and has progressively worsened.  She has nausea and burping. She has early satiety. She has had 3 crackers and greek yogurt today.  Chest xray in the ED today was normal.  She says she feels like foods are stopping in the center of her chest. She is able to swallow water. She says foods are not lodging though. She has a constant pressure in her esophagus. Rates pain 6/10. EKG and troponin x 1 were normal.  The GI cocktail and Protonix given in the ED  did not relieve her pain.  Given an Rx for Carafate 1gm QID, Omeprazole 40m daily, Famotidine 251mBID. She cannot tell if she is having any acid reflux.     EGD/Colonoscopy: 09/25/2010 Small sliding hiatal hernia without evidence of erosive esophagitis. Erosive antral gastritis.  The patient developed equivalent of Mallory Weiss tear at [employer] heaving during procedure. Bulbar and postbulbar duodenitis. Scattered diverticula thru out the colon. No evidence  of endoscopic colitis.    CBC    Component Value Date/Time   WBC 6.2 05/09/2015 0043   RBC 4.57 05/09/2015 0043   HGB 13.0 05/09/2015 0043   HCT 39.3 05/09/2015 0043   PLT 324 05/09/2015 0043   MCV 86.0 05/09/2015 0043   MCH 28.4 05/09/2015 0043   MCHC 33.1 05/09/2015 0043   RDW 14.1 05/09/2015 0043   LYMPHSABS 2.3 05/09/2015 0043   MONOABS 0.5 05/09/2015 0043   EOSABS 0.2 05/09/2015 0043   BASOSABS 0.0 05/09/2015 0043    .   BMET    Component Value Date/Time   NA 139 05/09/2015 0043   K 3.3* 05/09/2015 0043   CL 103 05/09/2015 0043   CO2 27 05/09/2015 0043   GLUCOSE 109* 05/09/2015 0043   BUN 17 05/09/2015 0043   CREATININE 0.91 05/09/2015 0043   CREATININE 0.84 07/17/2014 1129   CALCIUM 9.5 05/09/2015 0043   GFRNONAA >60 05/09/2015 0043   GFRAA >60 05/09/2015 0043       Lipase     Component Value Date/Time   LIPASE 20* 05/09/2015 0043   Troponin (Point of Care Test)     Review of Systems Past Medical History  Diagnosis Date  . Chest pain     cath 2005 norm cors, repeat cath Feb 2011 normal cors and only minimally  elevated pulmonary pressures  . Palpitations   .  Obesity   . Hiatal hernia   . Sleep apnea sleep study 11/03/2009    mild-no cpap  . GERD (gastroesophageal reflux disease)   . Crohn disease     Past Surgical History  Procedure Laterality Date  . Cesarean section    . Abdominal hysterectomy    . Shoulder surgery      right x 2   . Cholecystectomy  09/08/2006    lap. chole.  . Inguinal hernia repair  10/30/2008    right  . Ankle arthroscopy  06/01/2012    Procedure: ANKLE ARTHROSCOPY;  Surgeon: Colin Rhein, MD;  Location: [place];  Service: Orthopedics;  Laterality: Left;  left ankle arthorsocpy with extensive debridement and gastroc slide  . Cardiac catheterization  11/22/2009 and 2005    WNL  . Chondroplasty  06/17/2012    Procedure: CHONDROPLASTY;  Surgeon: Carole Civil, MD;  Location: [facility];  Service:  Orthopedics;  Laterality: Right;  right patella    Allergies  Allergen Reactions  . Other Shortness Of Breath    Lemon grass  . Wasp Venom Anaphylaxis and Shortness Of Breath  . Hydromorphone Hcl Nausea And Vomiting  . Omnipaque [Iohexol] Hives and Nausea Only    Pt. Was premedicated with emergent protocol.  . Pollen Extract Swelling  . Adhesive [Tape] Rash    Current Outpatient Prescriptions on File Prior to Visit  Medication Sig Dispense Refill  . albuterol (PROVENTIL) (2.5 MG/3ML) 0.083% nebulizer solution Take 2.5 mg by nebulization every 6 (six) hours as needed. For shortness of breath    . cetirizine (ZYRTEC) 10 MG tablet Take 10 mg by mouth daily as needed. For Allergies    . cyclobenzaprine (FLEXERIL) 10 MG tablet Take 1 tablet (10 mg total) by mouth 3 (three) times daily as needed. For muscle spasms 40 tablet 2  . dicyclomine (BENTYL) 10 MG capsule Take 1 capsule (10 mg total) by mouth 2 (two) times daily before a meal. 60 capsule 3  . diphenhydrAMINE (BENADRYL) 25 MG tablet Take 1 tablet (25 mg total) by mouth every 6 (six) hours. 20 tablet 0  . EPINEPHrine (EPIPEN) 0.3 mg/0.3 mL SOAJ injection Inject 0.3 mLs (0.3 mg total) into the muscle once. 1 Device 0  . famotidine (PEPCID) 20 MG tablet Take 1 tablet (20 mg total) by mouth 2 (two) times daily. 30 tablet 0  . omeprazole (PRILOSEC) 20 MG capsule Take 1 capsule (20 mg total) by mouth daily. 30 capsule 1  . ondansetron (ZOFRAN) 4 MG tablet Take 1 tablet (4 mg total) by mouth every 8 (eight) hours as needed for nausea or vomiting. 30 tablet 1  . ondansetron (ZOFRAN) 4 MG tablet Take 1 tablet (4 mg total) by mouth every 8 (eight) hours as needed for nausea or vomiting. 30 tablet 1  . sucralfate (CARAFATE) 1 G tablet Take 1 tablet (1 g total) by mouth 4 (four) times daily -  with meals and at bedtime. 90 tablet 1   No current facility-administered medications on file prior to visit.        Objective:   Physical Exam Blood  pressure 108/100, pulse 76, temperature 98.2 F (36.8 C), height 5' 11.5" (1.816 m), weight 245 lb 1.6 oz (111.177 kg).  Alert and oriented. Skin warm and dry. Oral mucosa is moist.   . Sclera anicteric, conjunctivae is pink. Thyroid not enlarged. No cervical lymphadenopathy. Lungs clear. Heart regular rate and rhythm.  Abdomen is soft. Bowel sounds are positive.  No hepatomegaly. No abdominal masses felt. No tenderness.  No edema to lower extremities.         Assessment & Plan:  Epigastric pain. PUD needs to be ruled out.  Discussed with Dr. Laural Golden.  EGD, Liver enzymes today.

## 2015-05-09 NOTE — Discharge Instructions (Signed)
Avoid aspirin containing medicines No nsaids Other meds as prescribed  Please obtain all of your results from medical records or have your doctors office obtain the results - share them with your doctor - you should be seen at your doctors office in the next 2 days. Call today to arrange your follow up. Take the medications as prescribed. Please review all of the medicines and only take them if you do not have an allergy to them. Please be aware that if you are taking birth control pills, taking other prescriptions, ESPECIALLY ANTIBIOTICS may make the birth control ineffective - if this is the case, either do not engage in sexual activity or use alternative methods of birth control such as condoms until you have finished the medicine and your family doctor says it is OK to restart them. If you are on a blood thinner such as COUMADIN, be aware that any other medicine that you take may cause the coumadin to either work too much, or not enough - you should have your coumadin level rechecked in next 7 days if this is the case.  ?  It is also a possibility that you have an allergic reaction to any of the medicines that you have been prescribed - Everybody reacts differently to medications and while MOST people have no trouble with most medicines, you may have a reaction such as nausea, vomiting, rash, swelling, shortness of breath. If this is the case, please stop taking the medicine immediately and contact your physician.  ?  You should return to the ER if you develop severe or worsening symptoms.

## 2015-05-09 NOTE — Patient Instructions (Signed)
EGD. The risks and benefits such as perforation, bleeding, and infection were reviewed with the patient and is agreeable.

## 2015-05-09 NOTE — ED Provider Notes (Signed)
CSN: [id number]     Arrival date & time 05/09/15  0017 History  This chart was scribed for Noemi Chapel, MD by Helane Gunther, ED Scribe. This patient was seen in room [facility] and the patient's care was started at 12:24 AM.    Chief Complaint  Patient presents with  . Chest Pain   The history is provided by the patient. No language interpreter was used.   HPI Comments: [name] is a 49 y.o. female with a PMHx of Crohn's Disease and a PSHx of 2 heart caths (left side only, and then both), who presents to the Emergency Department complaining of constant, radiating pressure to her chest and left side of the neck onset 3 hours ago. She notes exacerbation with movement. She states it began as epigastric and RUQ abdominal pressure about 1 week ago and has been spreading and worsening since. She notes associated nausea, loss of appetite, diarrhea, and increased burping. She states that after a few bites of food she begins to feel early satiety and an increase in the pressure. She feels uncomfortable when lying supine. She went to her PCP on 7/29 for a suspected flare up of her Crohn's disease where she received an MRI. Her PSHx of heart caths came back with no obstructions and states she had a 1st degree blockage. She also has a PSHx of cholecystectomy and ovariectomy. She denies pulmonary hypertension.   Past Medical History  Diagnosis Date  . Chest pain     cath 2005 norm cors, repeat cath Feb 2011 normal cors and only minimally  elevated pulmonary pressures  . Palpitations   . Obesity   . Hiatal hernia   . Sleep apnea sleep study 11/03/2009    mild-no cpap  . GERD (gastroesophageal reflux disease)   . Crohn disease    Past Surgical History  Procedure Laterality Date  . Cesarean section    . Abdominal hysterectomy    . Shoulder surgery      right x 2   . Cholecystectomy  09/08/2006    lap. chole.  . Inguinal hernia repair  10/30/2008    right  . Ankle arthroscopy  06/01/2012     Procedure: ANKLE ARTHROSCOPY;  Surgeon: Colin Rhein, MD;  Location: [place];  Service: Orthopedics;  Laterality: Left;  left ankle arthorsocpy with extensive debridement and gastroc slide  . Cardiac catheterization  11/22/2009 and 2005    WNL  . Chondroplasty  06/17/2012    Procedure: CHONDROPLASTY;  Surgeon: Carole Civil, MD;  Location: [facility];  Service: Orthopedics;  Laterality: Right;  right patella   Family History  Problem Relation Age of Onset  . Hypertension Mother   . Lupus Father   . COPD Father    History  Substance Use Topics  . Smoking status: Never Smoker   . Smokeless tobacco: Never Used  . Alcohol Use: No   OB History    No data available     Review of Systems  Constitutional: Positive for appetite change.  Cardiovascular: Positive for chest pain.  Gastrointestinal: Positive for nausea, abdominal pain and diarrhea.  All other systems reviewed and are negative.   Allergies  Other; Wasp venom; Hydromorphone hcl; Omnipaque; Pollen extract; and Adhesive  Home Medications   Prior to Admission medications   Medication Sig Start Date End Date Taking? Authorizing Provider  albuterol (PROVENTIL) (2.5 MG/3ML) 0.083% nebulizer solution Take 2.5 mg by nebulization every 6 (six) hours as needed. For shortness  of breath    Historical Provider, MD  cetirizine (ZYRTEC) 10 MG tablet Take 10 mg by mouth daily as needed. For Allergies    Historical Provider, MD  cyclobenzaprine (FLEXERIL) 10 MG tablet Take 1 tablet (10 mg total) by mouth 3 (three) times daily as needed. For muscle spasms 07/11/12   Carole Civil, MD  dicyclomine (BENTYL) 10 MG capsule Take 1 capsule (10 mg total) by mouth 2 (two) times daily before a meal. 05/02/15   Butch Penny, NP  diphenhydrAMINE (BENADRYL) 25 MG tablet Take 1 tablet (25 mg total) by mouth every 6 (six) hours. 12/15/13   Linton Flemings, MD  EPINEPHrine (EPIPEN) 0.3 mg/0.3 mL SOAJ injection Inject 0.3 mLs (0.3 mg  total) into the muscle once. 12/15/13   Linton Flemings, MD  famotidine (PEPCID) 20 MG tablet Take 1 tablet (20 mg total) by mouth 2 (two) times daily. 05/09/15   Noemi Chapel, MD  omeprazole (PRILOSEC) 20 MG capsule Take 1 capsule (20 mg total) by mouth daily. 05/09/15   Noemi Chapel, MD  ondansetron (ZOFRAN) 4 MG tablet Take 1 tablet (4 mg total) by mouth every 8 (eight) hours as needed for nausea or vomiting. 07/17/14   Butch Penny, NP  ondansetron (ZOFRAN) 4 MG tablet Take 1 tablet (4 mg total) by mouth every 8 (eight) hours as needed for nausea or vomiting. 05/02/15   Butch Penny, NP  sucralfate (CARAFATE) 1 G tablet Take 1 tablet (1 g total) by mouth 4 (four) times daily -  with meals and at bedtime. 05/09/15   Noemi Chapel, MD   BP 157/103 mmHg  Pulse 79  Temp(Src) 98.1 F (36.7 C)  Resp 17  SpO2 99% Physical Exam  Constitutional: She appears well-developed and well-nourished. No distress.  Appears anxious. Mild tremor.  HENT:  Head: Normocephalic and atraumatic.  Mouth/Throat: Oropharynx is clear and moist. No oropharyngeal exudate.  Eyes: Conjunctivae and EOM are normal. Pupils are equal, round, and reactive to light. Right eye exhibits no discharge. Left eye exhibits no discharge. No scleral icterus.  Neck: Normal range of motion. Neck supple. No JVD present. No thyromegaly present.  Cardiovascular: Normal rate, regular rhythm, normal heart sounds and intact distal pulses.  Exam reveals no gallop and no friction rub.   No murmur heard. Pulmonary/Chest: Effort normal and breath sounds normal. No respiratory distress. She has no wheezes. She has no rales.  Abdominal: Soft. Bowel sounds are normal. She exhibits no distension and no mass. There is tenderness.  Epigastric TTP  Musculoskeletal: Normal range of motion. She exhibits no edema or tenderness.  Lymphadenopathy:    She has no cervical adenopathy.  Neurological: She is alert. Coordination normal.  Skin: Skin is warm and dry. No  rash noted. No erythema.  Psychiatric: She has a normal mood and affect. Her behavior is normal.  Nursing note and vitals reviewed.   ED Course  Procedures  DIAGNOSTIC STUDIES: Oxygen Saturation is 100% on RA, normal by my interpretation.    COORDINATION OF CARE: 12:32 AM - Discussed normal EKG. Discussed plans to review past MRI. Pt advised of plan for treatment and pt agrees.  Labs Review Labs Reviewed  BASIC METABOLIC PANEL - Abnormal; Notable for the following:    Potassium 3.3 (*)    Glucose, Bld 109 (*)    All other components within normal limits  LIPASE, BLOOD - Abnormal; Notable for the following:    Lipase 20 (*)    All other components  within normal limits  CBC WITH DIFFERENTIAL/PLATELET  TROPONIN I    Imaging Review Dg Chest 2 View  05/09/2015   CLINICAL DATA:  Acute onset of central chest pain, radiating to the mid upper back. Neck and jaw pain. Initial encounter.  EXAM: CHEST  2 VIEW  COMPARISON:  Chest radiograph performed 06/12/2011  FINDINGS: The lungs are well-aerated. Pulmonary vascularity is at the upper limits of normal. There is no evidence of focal opacification, pleural effusion or pneumothorax.  The heart is borderline normal in size. No acute osseous abnormalities are seen. Clips are noted within the right upper quadrant, reflecting prior cholecystectomy.  IMPRESSION: No acute cardiopulmonary process seen.   Electronically Signed   By: Garald Balding M.D.   On: 05/09/2015 01:24     EKG Interpretation   Date/Time:  Thursday May 09 2015 00:27:34 EDT Ventricular Rate:  75 PR Interval:  189 QRS Duration: 98 QT Interval:  390 QTC Calculation: 436 R Axis:   27 Text Interpretation:  Sinus rhythm Borderline T abnormalities, anterior  leads Since last tracing T wave abnormality now improved. Confirmed by  Sabra Heck  MD, Bells ([id number]) on 05/09/2015 12:39:21 AM      MDM   Final diagnoses:  Chest pain  Gastroesophageal reflux disease with esophagitis     The pt has normal labs - she has unremarkable ECG and likely has reflux esophagitis, meds rx for home, pt is in agreement.  Meds given in ED:  Medications  0.9 %  sodium chloride infusion ( Intravenous New Bag/Given 05/09/15 0050)  gi cocktail (Maalox,Lidocaine,Donnatal) (30 mLs Oral Given 05/09/15 0051)  famotidine (PEPCID) IVPB 20 mg premix (0 mg Intravenous Stopped 05/09/15 0142)  pantoprazole (PROTONIX) injection 40 mg (40 mg Intravenous Given 05/09/15 0051)    New Prescriptions   FAMOTIDINE (PEPCID) 20 MG TABLET    Take 1 tablet (20 mg total) by mouth 2 (two) times daily.   OMEPRAZOLE (PRILOSEC) 20 MG CAPSULE    Take 1 capsule (20 mg total) by mouth daily.   SUCRALFATE (CARAFATE) 1 G TABLET    Take 1 tablet (1 g total) by mouth 4 (four) times daily -  with meals and at bedtime.      I personally performed the services described in this documentation, which was scribed in my presence. The recorded information has been reviewed and considered.   Noemi Chapel, MD 05/09/15 [phone]

## 2015-05-10 LAB — HEPATIC FUNCTION PANEL
ALK PHOS: 53 U/L (ref 33–115)
ALT: 15 U/L (ref 6–29)
AST: 17 U/L (ref 10–35)
Albumin: 4.3 g/dL (ref 3.6–5.1)
BILIRUBIN DIRECT: 0.1 mg/dL (ref <=0.2)
BILIRUBIN TOTAL: 0.7 mg/dL (ref 0.2–1.2)
Indirect Bilirubin: 0.6 mg/dL (ref 0.2–1.2)
TOTAL PROTEIN: 7 g/dL (ref 6.1–8.1)

## 2015-05-13 ENCOUNTER — Ambulatory Visit (INDEPENDENT_AMBULATORY_CARE_PROVIDER_SITE_OTHER): Payer: Self-pay | Admitting: Internal Medicine

## 2015-05-14 ENCOUNTER — Encounter (HOSPITAL_COMMUNITY)
Admission: RE | Disposition: A | Discharge status: Home / Self Care | Payer: Self-pay | Source: Ambulatory Visit | Attending: Internal Medicine

## 2015-05-14 ENCOUNTER — Ambulatory Visit (HOSPITAL_COMMUNITY)
Admission: RE | Admit: 2015-05-14 | Discharge: 2015-05-14 | Disposition: A | Discharge status: Home / Self Care | Payer: 59 | Source: Ambulatory Visit | Attending: Internal Medicine | Admitting: Internal Medicine

## 2015-05-14 ENCOUNTER — Encounter (HOSPITAL_COMMUNITY): Payer: Self-pay

## 2015-05-14 DIAGNOSIS — K219 Gastro-esophageal reflux disease without esophagitis: Secondary | ICD-10-CM | POA: Diagnosis not present

## 2015-05-14 DIAGNOSIS — E669 Obesity, unspecified: Secondary | ICD-10-CM | POA: Diagnosis not present

## 2015-05-14 DIAGNOSIS — K222 Esophageal obstruction: Secondary | ICD-10-CM | POA: Diagnosis not present

## 2015-05-14 DIAGNOSIS — G8929 Other chronic pain: Secondary | ICD-10-CM

## 2015-05-14 DIAGNOSIS — G4730 Sleep apnea, unspecified: Secondary | ICD-10-CM | POA: Insufficient documentation

## 2015-05-14 DIAGNOSIS — K449 Diaphragmatic hernia without obstruction or gangrene: Secondary | ICD-10-CM | POA: Diagnosis not present

## 2015-05-14 DIAGNOSIS — R29898 Other symptoms and signs involving the musculoskeletal system: Secondary | ICD-10-CM | POA: Insufficient documentation

## 2015-05-14 DIAGNOSIS — I341 Nonrheumatic mitral (valve) prolapse: Secondary | ICD-10-CM | POA: Insufficient documentation

## 2015-05-14 DIAGNOSIS — M2550 Pain in unspecified joint: Secondary | ICD-10-CM | POA: Diagnosis not present

## 2015-05-14 DIAGNOSIS — M6788 Other specified disorders of synovium and tendon, other site: Secondary | ICD-10-CM | POA: Diagnosis not present

## 2015-05-14 DIAGNOSIS — Z888 Allergy status to other drugs, medicaments and biological substances status: Secondary | ICD-10-CM | POA: Insufficient documentation

## 2015-05-14 DIAGNOSIS — R0789 Other chest pain: Secondary | ICD-10-CM | POA: Insufficient documentation

## 2015-05-14 DIAGNOSIS — Z885 Allergy status to narcotic agent status: Secondary | ICD-10-CM | POA: Insufficient documentation

## 2015-05-14 DIAGNOSIS — M1710 Unilateral primary osteoarthritis, unspecified knee: Secondary | ICD-10-CM | POA: Insufficient documentation

## 2015-05-14 DIAGNOSIS — Z91048 Other nonmedicinal substance allergy status: Secondary | ICD-10-CM | POA: Insufficient documentation

## 2015-05-14 DIAGNOSIS — K319 Disease of stomach and duodenum, unspecified: Secondary | ICD-10-CM | POA: Insufficient documentation

## 2015-05-14 DIAGNOSIS — K2990 Gastroduodenitis, unspecified, without bleeding: Secondary | ICD-10-CM | POA: Insufficient documentation

## 2015-05-14 DIAGNOSIS — K5000 Crohn's disease of small intestine without complications: Secondary | ICD-10-CM | POA: Insufficient documentation

## 2015-05-14 DIAGNOSIS — R1013 Epigastric pain: Secondary | ICD-10-CM

## 2015-05-14 DIAGNOSIS — R002 Palpitations: Secondary | ICD-10-CM | POA: Diagnosis not present

## 2015-05-14 DIAGNOSIS — I10 Essential (primary) hypertension: Secondary | ICD-10-CM | POA: Insufficient documentation

## 2015-05-14 DIAGNOSIS — Z91030 Bee allergy status: Secondary | ICD-10-CM | POA: Diagnosis not present

## 2015-05-14 DIAGNOSIS — R1011 Right upper quadrant pain: Secondary | ICD-10-CM

## 2015-05-14 DIAGNOSIS — R079 Chest pain, unspecified: Secondary | ICD-10-CM

## 2015-05-14 DIAGNOSIS — R262 Difficulty in walking, not elsewhere classified: Secondary | ICD-10-CM | POA: Diagnosis not present

## 2015-05-14 HISTORY — PX: ESOPHAGOGASTRODUODENOSCOPY: SHX5428

## 2015-05-14 SURGERY — EGD (ESOPHAGOGASTRODUODENOSCOPY)
Anesthesia: Moderate Sedation

## 2015-05-14 MED ORDER — PANTOPRAZOLE SODIUM 40 MG PO TBEC: 40.0000 mg | DELAYED_RELEASE_TABLET | Freq: Two times a day (BID) | ORAL | Status: DC

## 2015-05-14 MED ORDER — BUTAMBEN-TETRACAINE-BENZOCAINE 2-2-14 % EX AERO
INHALATION_SPRAY | CUTANEOUS | Status: DC | PRN
  Administered 2015-05-14: 2 via TOPICAL

## 2015-05-14 MED ORDER — SODIUM CHLORIDE 0.9 % IV SOLN
INTRAVENOUS | Status: DC
  Administered 2015-05-14: 07:00:00 via INTRAVENOUS

## 2015-05-14 MED ORDER — MIDAZOLAM HCL 5 MG/5ML IJ SOLN
INTRAMUSCULAR | Status: DC | PRN
  Administered 2015-05-14 (×3): 2 mg via INTRAVENOUS
  Administered 2015-05-14 (×2): 3 mg via INTRAVENOUS
  Administered 2015-05-14: 2 mg via INTRAVENOUS

## 2015-05-14 MED ORDER — MEPERIDINE HCL 50 MG/ML IJ SOLN
INTRAMUSCULAR | Status: AC
  Filled 2015-05-14: qty 1

## 2015-05-14 MED ORDER — MIDAZOLAM HCL 5 MG/5ML IJ SOLN
INTRAMUSCULAR | Status: AC
  Filled 2015-05-14: qty 5

## 2015-05-14 MED ORDER — STERILE WATER FOR IRRIGATION IR SOLN
Status: DC | PRN
  Administered 2015-05-14: 08:00:00

## 2015-05-14 MED ORDER — MIDAZOLAM HCL 5 MG/5ML IJ SOLN
INTRAMUSCULAR | Status: AC
  Filled 2015-05-14: qty 10

## 2015-05-14 MED ORDER — MEPERIDINE HCL 50 MG/ML IJ SOLN
INTRAMUSCULAR | Status: DC | PRN
  Administered 2015-05-14 (×4): 25 mg via INTRAVENOUS

## 2015-05-14 NOTE — H&P (Addendum)
[name] is an 49 y.o. female.   Chief Complaint: Patient is here for EGD. HPI: Patient is 49 year old African-American female who presents with 2 week history of intermittent right upper quadrant pain and chest pressure. She feels like her food is not digesting supplement throat. She denies dysphagia nausea vomiting melena or rectal bleeding. Her bowels are irregular she either has constipation and over diarrhea. She does not take OTC NSAIDs. She's had cholecystectomy. Internal controlled with omeprazole and famotidine. She does not take OTC NSAIDs. Her appetite is fair she has not lost any weight recently. She also has history of ileal Crohn's disease. Recent CT enterography was negative. She was seen in emergency room last week with chest pain and troponin levels were negative. LFTs in CBC are also normal except neutrophilia. She also gives history of mitral valve prolapse. She's had 2 cardiac as in the past without significant disease. Last cardiac cath was in February 2011.  Past Medical History  Diagnosis Date  . Chest pain     cath 2005 norm cors, repeat cath Feb 2011 normal cors and only minimally  elevated pulmonary pressures  . Palpitations   . Obesity   . Hiatal hernia   . Sleep apnea sleep study 11/03/2009    mild-no cpap  . GERD (gastroesophageal reflux disease)   . Crohn disease     Past Surgical History  Procedure Laterality Date  . Cesarean section    . Abdominal hysterectomy    . Shoulder surgery      right x 2   . Cholecystectomy  09/08/2006    lap. chole.  . Inguinal hernia repair  10/30/2008    right  . Ankle arthroscopy  06/01/2012    Procedure: ANKLE ARTHROSCOPY;  Surgeon: Colin Rhein, MD;  Location: [facility];  Service: Orthopedics;  Laterality: Left;  left ankle arthorsocpy with extensive debridement and gastroc slide  . Cardiac catheterization  11/22/2009 and 2005    WNL  . Chondroplasty  06/17/2012    Procedure: CHONDROPLASTY;  Surgeon:  Carole Civil, MD;  Location: [facility];  Service: Orthopedics;  Laterality: Right;  right patella    Family History  Problem Relation Age of Onset  . Hypertension Mother   . Lupus Father   . COPD Father    Social History:  reports that she has never smoked. She has never used smokeless tobacco. She reports that she does not drink alcohol or use illicit drugs.  Allergies:  Allergies  Allergen Reactions  . Other Shortness Of Breath    Lemon grass  . Wasp Venom Anaphylaxis and Shortness Of Breath  . Hydromorphone Hcl Nausea And Vomiting  . Omnipaque [Iohexol] Hives and Nausea Only    Pt. Was premedicated with emergent protocol.  . Pollen Extract Swelling  . Adhesive [Tape] Rash    Medications Prior to Admission  Medication Sig Dispense Refill  . albuterol (PROVENTIL) (2.5 MG/3ML) 0.083% nebulizer solution Take 2.5 mg by nebulization every 6 (six) hours as needed. For shortness of breath    . cetirizine (ZYRTEC) 10 MG tablet Take 10 mg by mouth daily as needed. For Allergies    . cyclobenzaprine (FLEXERIL) 10 MG tablet Take 1 tablet (10 mg total) by mouth 3 (three) times daily as needed. For muscle spasms 40 tablet 2  . dicyclomine (BENTYL) 10 MG capsule Take 1 capsule (10 mg total) by mouth 2 (two) times daily before a meal. 60 capsule 3  . diphenhydrAMINE (BENADRYL) 25  MG tablet Take 1 tablet (25 mg total) by mouth every 6 (six) hours. 20 tablet 0  . famotidine (PEPCID) 20 MG tablet Take 1 tablet (20 mg total) by mouth 2 (two) times daily. 30 tablet 0  . omeprazole (PRILOSEC) 20 MG capsule Take 1 capsule (20 mg total) by mouth daily. 30 capsule 1  . ondansetron (ZOFRAN) 4 MG tablet Take 1 tablet (4 mg total) by mouth every 8 (eight) hours as needed for nausea or vomiting. 30 tablet 1  . ondansetron (ZOFRAN) 4 MG tablet Take 1 tablet (4 mg total) by mouth every 8 (eight) hours as needed for nausea or vomiting. 30 tablet 1  . sucralfate (CARAFATE) 1 G tablet Take 1 tablet (1 g  total) by mouth 4 (four) times daily -  with meals and at bedtime. 90 tablet 1  . EPINEPHrine (EPIPEN) 0.3 mg/0.3 mL SOAJ injection Inject 0.3 mLs (0.3 mg total) into the muscle once. 1 Device 0    No results found for this or any previous visit (from the past 48 hour(s)). No results found.  ROS  Blood pressure 137/92, pulse 78, temperature 98.2 F (36.8 C), temperature source Oral, resp. rate 9, SpO2 98 %. Physical Exam  Constitutional: She appears well-developed and well-nourished.  HENT:  Mouth/Throat: Oropharynx is clear and moist.  Eyes: Conjunctivae are normal. No scleral icterus.  Neck: No thyromegaly present.  Cardiovascular: Normal rate, regular rhythm and normal heart sounds.   No murmur heard. Respiratory: Effort normal and breath sounds normal.  GI:  Abdomen is full. It is soft with mild tenderness in midepigastric region and also in right low quadrant. No organomegaly or masses.  Musculoskeletal: She exhibits no edema.  Lymphadenopathy:    She has no cervical adenopathy.  Neurological: She is alert.  Skin: Skin is warm and dry.     Assessment/Plan Noncardiac chest pain and intermittent right upper quadrant pain. Chronic GERD. Diagnostic EGD.  REHMAN,NAJEEB U 05/14/2015, 7:37 AM

## 2015-05-14 NOTE — Discharge Instructions (Signed)
Discontinue omeprazole and famotidine. Resume other medications as before. Pantoprazole 40 mg by mouth 30 minutes before breakfast and evening meal daily. No driving for 24 hours. Physician will call with biopsy results.   Esophagogastroduodenoscopy Care After Refer to this sheet in the next few weeks. These instructions provide you with information on caring for yourself after your procedure. Your caregiver may also give you more specific instructions. Your treatment has been planned according to current medical practices, but problems sometimes occur. Call your caregiver if you have any problems or questions after your procedure.  HOME CARE INSTRUCTIONS  Do not eat or drink anything until the numbing medicine (local anesthetic) has worn off and your gag reflex has returned. You will know that the local anesthetic has worn off when you can swallow comfortably.  Do not drive for 12 hours after the procedure or as directed by your caregiver.  Only take medicines as directed by your caregiver. SEEK MEDICAL CARE IF:   You cannot stop coughing.  You are not urinating at all or less than usual. SEEK IMMEDIATE MEDICAL CARE IF:  You have difficulty swallowing.  You cannot eat or drink.  You have worsening throat or chest pain.  You have dizziness, lightheadedness, or you faint.  You have nausea or vomiting.  You have chills.  You have a fever.  You have severe abdominal pain.  You have black, tarry, or bloody stools. Document Released: 09/07/2012 Document Reviewed: 09/07/2012 [facility] Patient Information 2015 [place]. This information is not intended to replace advice given to you by your health care provider. Make sure you discuss any questions you have with your health care provider.

## 2015-05-14 NOTE — Op Note (Signed)
EGD PROCEDURE REPORT  PATIENT:  [name]  MR#:  [id number] Birthdate:  10/12/1965, 49 y.o., female Endoscopist:  Dr. Rogene Houston, MD Referred By:  Dr. Purvis Kilts, MD Procedure Date: 05/14/2015  Procedure:   EGD  Indications:  Patient is 49 year old African-American female who presents with intermittent chest pain felt to be noncardiac as well as epigastric and right upper quadrant pain. She has chronic GERD and is on PPI and H2 B and says heartburns well controlled. She also feels food stays in her stomach and esophagus for long, she denies dysphagia. She has history of ileal Crohn's disease but she appears to be in remission. She is undergoing diagnostic EGD.            Informed Consent:  The risks, benefits, alternatives & imponderables which include, but are not limited to, bleeding, infection, perforation, drug reaction and potential missed lesion have been reviewed.  The potential for biopsy, lesion removal, esophageal dilation, etc. have also been discussed.  Questions have been answered.  All parties agreeable.  Please see history & physical in medical record for more information.  Medications:  Demerol 100 mg IV Versed 14 mg IV Cetacaine spray topically for oropharyngeal anesthesia  Description of procedure:  The endoscope was introduced through the mouth and advanced to the second portion of the duodenum without difficulty or limitations. The mucosal surfaces were surveyed very carefully during advancement of the scope and upon withdrawal.  Findings:  Esophagus:  Mucosa of the esophagus was normal. Schatzki's ring noted at GE junction. GEJ:  40 cm Hiatus:  42 cm Stomach:  Stomach was empty and distended very well with insufflation. Folds in the proximal stomach were normal. Examination of gastric mucosa body was normal. Patchy erythema edema with single erosion noted at antrum. Pyloric channel was patent. Tenderness fundus and cardia were unremarkable. Duodenum:   Patchy edema and erythema noted to bulbar mucosa without erosions. Post bulbar mucosa was normal.  Therapeutic/Diagnostic Maneuvers Performed:   Antral biopsy was taken for routine histology. Schatzki's ring was disrupted with four-quadrant biopsy.  Complications: None  Impression:  Schatzki's ring and small sliding hiatal hernia. Gastroduodenitis. Antral biopsy taken. No evidence of erosive esophagitis or peptic ulcer disease. Schatzki's ring was disrupted with four quadrant biopsy.  Recommendations:  Discontinue famotidine and omeprazole. Pantoprazole 40 mg by mouth twice a day. I will be contacting patient with biopsy results and further recommendations.  REHMAN,NAJEEB U  05/14/2015  8:12 AM  CC: Dr. Hilma Favors, Betsy Coder, MD & Dr. Rayne Du ref. provider found

## 2015-05-15 ENCOUNTER — Inpatient Hospital Stay (HOSPITAL_COMMUNITY): Admission: RE | Admit: 2015-05-15 | Payer: Self-pay | Source: Ambulatory Visit

## 2015-05-15 ENCOUNTER — Other Ambulatory Visit (HOSPITAL_COMMUNITY): Payer: Self-pay

## 2015-05-17 ENCOUNTER — Ambulatory Visit (INDEPENDENT_AMBULATORY_CARE_PROVIDER_SITE_OTHER): Payer: Self-pay | Admitting: Internal Medicine

## 2015-05-20 ENCOUNTER — Encounter (HOSPITAL_COMMUNITY): Payer: Self-pay | Admitting: Internal Medicine

## 2015-06-03 ENCOUNTER — Telehealth (INDEPENDENT_AMBULATORY_CARE_PROVIDER_SITE_OTHER): Payer: Self-pay | Admitting: *Deleted

## 2015-06-03 NOTE — Telephone Encounter (Signed)
Patient's call returned. I called her week for last as well as last week but there was no answer.

## 2015-06-03 NOTE — Telephone Encounter (Signed)
Please call patient ar [phone] with Bx results

## 2015-06-25 ENCOUNTER — Ambulatory Visit: Payer: 59 | Admitting: Orthopedic Surgery

## 2015-07-30 ENCOUNTER — Ambulatory Visit (INDEPENDENT_AMBULATORY_CARE_PROVIDER_SITE_OTHER): Payer: 59 | Admitting: Internal Medicine

## 2015-07-30 ENCOUNTER — Encounter (INDEPENDENT_AMBULATORY_CARE_PROVIDER_SITE_OTHER): Payer: Self-pay | Admitting: Internal Medicine

## 2015-07-30 VITALS — BP 130/92 | HR 72 | Temp 98.5°F | Resp 18 | Ht 71.5 in | Wt 253.0 lb

## 2015-07-30 DIAGNOSIS — K5090 Crohn's disease, unspecified, without complications: Secondary | ICD-10-CM | POA: Diagnosis not present

## 2015-07-30 DIAGNOSIS — K589 Irritable bowel syndrome without diarrhea: Secondary | ICD-10-CM | POA: Diagnosis not present

## 2015-07-30 DIAGNOSIS — K219 Gastro-esophageal reflux disease without esophagitis: Secondary | ICD-10-CM

## 2015-07-30 MED ORDER — BENEFIBER DRINK MIX PO PACK: 4.0000 g | PACK | Freq: Every day | ORAL | Status: DC

## 2015-07-30 NOTE — Progress Notes (Signed)
Presenting complaint;  Follow-up for GERD abdominal pain and irregular bowel movements.  Subjective:  Patient is 49 year old African-American female was here for scheduled visit. Following her office visit on 05/09/2015 she underwent EGD on 05/14/2015 which revealed Manuella Ghazi Oski's ring and small sliding hiatal hernia as well as gastroduodenitis. Chasse Kiesling with disrupted with four-quadrant biopsy. Antral biopsy was negative for H. pylori gastritis. PPI dose was doubled. She feels better. She is having less heartburn and regurgitation. She is watching her diet in eats fried food normal once a week. She seemed to have more problems on days when she is working because she drinks coffee. She is now working 3 days a week. She denies nausea or vomiting. She may have heartburn 2 or 3 times a week. Previously she was having it every day. She remains with intermittent LLQ abdominal pain. She is having anywhere from 1-6 bowel movements per day. Every now and then she can skip a day. She denies melena or rectal bleeding. Stool consistency varies. She has gained 8 pounds since her last visit. He does not take OTC NSAIDs.    Current Medications: Outpatient Encounter Prescriptions as of 07/30/2015  Medication Sig  . albuterol (PROVENTIL) (2.5 MG/3ML) 0.083% nebulizer solution Take 2.5 mg by nebulization every 6 (six) hours as needed. For shortness of breath  . cetirizine (ZYRTEC) 10 MG tablet Take 10 mg by mouth daily as needed. For Allergies  . cyclobenzaprine (FLEXERIL) 10 MG tablet Take 1 tablet (10 mg total) by mouth 3 (three) times daily as needed. For muscle spasms  . dicyclomine (BENTYL) 10 MG capsule Take 1 capsule (10 mg total) by mouth 2 (two) times daily before a meal.  . diphenhydrAMINE (BENADRYL) 25 MG tablet Take 1 tablet (25 mg total) by mouth every 6 (six) hours.  Marland Kitchen EPINEPHrine (EPIPEN) 0.3 mg/0.3 mL SOAJ injection Inject 0.3 mLs (0.3 mg total) into the muscle once.  . ondansetron (ZOFRAN)  4 MG tablet Take 1 tablet (4 mg total) by mouth every 8 (eight) hours as needed for nausea or vomiting.  . pantoprazole (PROTONIX) 40 MG tablet Take 1 tablet (40 mg total) by mouth 2 (two) times daily before a meal.  . sucralfate (CARAFATE) 1 G tablet Take 1 tablet (1 g total) by mouth 4 (four) times daily -  with meals and at bedtime.  . VENTOLIN HFA 108 (90 BASE) MCG/ACT inhaler Inhale 2 puffs into the lungs every 6 (six) hours as needed. for wheezing   No facility-administered encounter medications on file as of 07/30/2015.     Objective: Blood pressure 130/92, pulse 72, temperature 98.5 F (36.9 C), temperature source Oral, resp. rate 18, height 5' 11.5" (1.816 m), weight 253 lb (114.76 kg). Patient is alert and in no acute distress. Conjunctiva is pink. Sclera is nonicteric Oropharyngeal mucosa is normal. No neck masses or thyromegaly noted. Cardiac exam with regular rhythm normal S1 and S2. No murmur or gallop noted. Lungs are clear to auscultation. Abdomen is full. Bowel sounds are normal. On palpation abdomen is soft with mild tenderness at LLQ without guarding. No organomegaly or masses.  No LE edema or clubbing noted.    Assessment:  #1. Irritable bowel syndrome. She has typical symptoms. She remains with irregular bowel movements. She needs to be taking dicyclomine on schedule rather than when necessary basis. She would also benefit from fiber supplement. I believe she would do better if her bowels become more predictable. #2. Small bowel Crohn's disease. She remains in remission. MR  enterography in July 2016 was unremarkable. This condition was diagnosed back in December 2012 when she was found to have ileitis on colonoscopy and small bowel erosions with small bowel given capsule study. #3. GERD. She is doing better with double dose PPI. Dose will be reduced on her next visit.   Plan:  Benefiber 4 g by mouth daily at bedtime. Dicyclomine 10 mg by mouth before breakfast  and lunch daily. Patient advised to use Dulcolax or glycerin suppository if she becomes constipated and should refrain from using by mouth laxatives.

## 2015-07-30 NOTE — Patient Instructions (Signed)
Take dicyclomine once or twice daily on schedule(before breakfast and lunch). Take Benefiber 4 g by mouth daily at bedtime. Can use Dulcolax or glycerin suppository for constipation or if he have an urge and unable to have a bowel movement.

## 2015-12-16 ENCOUNTER — Other Ambulatory Visit (HOSPITAL_COMMUNITY): Payer: Self-pay | Admitting: Family Medicine

## 2015-12-16 DIAGNOSIS — Z1231 Encounter for screening mammogram for malignant neoplasm of breast: Secondary | ICD-10-CM

## 2015-12-17 ENCOUNTER — Ambulatory Visit (INDEPENDENT_AMBULATORY_CARE_PROVIDER_SITE_OTHER): Payer: Self-pay | Admitting: Internal Medicine

## 2015-12-19 ENCOUNTER — Ambulatory Visit (HOSPITAL_COMMUNITY): Payer: Self-pay

## 2016-01-15 ENCOUNTER — Ambulatory Visit (HOSPITAL_COMMUNITY): Payer: Self-pay

## 2016-01-29 ENCOUNTER — Ambulatory Visit (INDEPENDENT_AMBULATORY_CARE_PROVIDER_SITE_OTHER): Payer: 59

## 2016-01-29 ENCOUNTER — Encounter: Payer: Self-pay | Admitting: Orthopedic Surgery

## 2016-01-29 ENCOUNTER — Ambulatory Visit (INDEPENDENT_AMBULATORY_CARE_PROVIDER_SITE_OTHER): Payer: 59 | Admitting: Orthopedic Surgery

## 2016-01-29 VITALS — BP 143/92 | Ht 71.5 in | Wt 256.0 lb

## 2016-01-29 DIAGNOSIS — M170 Bilateral primary osteoarthritis of knee: Secondary | ICD-10-CM

## 2016-01-29 DIAGNOSIS — M62838 Other muscle spasm: Secondary | ICD-10-CM

## 2016-01-29 DIAGNOSIS — M79642 Pain in left hand: Secondary | ICD-10-CM

## 2016-01-29 DIAGNOSIS — M25551 Pain in right hip: Secondary | ICD-10-CM | POA: Diagnosis not present

## 2016-01-29 MED ORDER — CYCLOBENZAPRINE HCL 10 MG PO TABS: 10.0000 mg | ORAL_TABLET | Freq: Three times a day (TID) | ORAL | Status: DC | PRN

## 2016-01-29 MED ORDER — PREDNISONE 5 MG (21) PO TBPK: 5.0000 mg | ORAL_TABLET | Freq: Every day | ORAL | Status: DC

## 2016-01-29 NOTE — Patient Instructions (Signed)
You have been referred to Rhematology

## 2016-01-31 ENCOUNTER — Telehealth: Payer: Self-pay | Admitting: *Deleted

## 2016-01-31 NOTE — Telephone Encounter (Signed)
REFERRAL FAXED TO DR Estanislado Pandy

## 2016-01-31 NOTE — Progress Notes (Signed)
Chief Complaint  Patient presents with  . Follow-up    bilateral knees   HPI  patient followed for bilateral knees she missed the last exam, she complains of bilateral knee pain multiple joint pain left wrist pain right hip pain  Has a family history of arthritis of the hip in her dad  She presents with unexplained increase in pain in both knees left wrist and right hip the pain is best described as a dull ache does not appear to be significantly related to activity and appears to be constant She says the pain has been severe  ROS  Musculoskeletal joint swelling joint pain skin no rashes or itching no erythema neurologically no numbness or tingling, no unsteady gait  Past Medical History  Diagnosis Date  . Chest pain     cath 2005 norm cors, repeat cath Feb 2011 normal cors and only minimally  elevated pulmonary pressures  . Palpitations   . Obesity   . Hiatal hernia   . Sleep apnea sleep study 11/03/2009    mild-no cpap  . GERD (gastroesophageal reflux disease)   . Crohn disease [facility])     Past Surgical History  Procedure Laterality Date  . Cesarean section    . Abdominal hysterectomy    . Shoulder surgery      right x 2   . Cholecystectomy  09/08/2006    lap. chole.  . Inguinal hernia repair  10/30/2008    right  . Ankle arthroscopy  06/01/2012    Procedure: ANKLE ARTHROSCOPY;  Surgeon: Colin Rhein, MD;  Location: [facility];  Service: Orthopedics;  Laterality: Left;  left ankle arthorsocpy with extensive debridement and gastroc slide  . Cardiac catheterization  11/22/2009 and 2005    WNL  . Chondroplasty  06/17/2012    Procedure: CHONDROPLASTY;  Surgeon: Carole Civil, MD;  Location: [facility];  Service: Orthopedics;  Laterality: Right;  right patella  . Esophagogastroduodenoscopy N/A 05/14/2015    Procedure: ESOPHAGOGASTRODUODENOSCOPY (EGD);  Surgeon: Rogene Houston, MD;  Location: [facility];  Service: Endoscopy;  Laterality: N/A;  730   Family  History  Problem Relation Age of Onset  . Hypertension Mother   . Lupus Father   . COPD Father    Social History  Substance Use Topics  . Smoking status: Never Smoker   . Smokeless tobacco: Never Used  . Alcohol Use: No    Current outpatient prescriptions:  .  albuterol (PROVENTIL) (2.5 MG/3ML) 0.083% nebulizer solution, Take 2.5 mg by nebulization every 6 (six) hours as needed. For shortness of breath, Disp: , Rfl:  .  cetirizine (ZYRTEC) 10 MG tablet, Take 10 mg by mouth daily as needed. For Allergies, Disp: , Rfl:  .  cyclobenzaprine (FLEXERIL) 10 MG tablet, Take 1 tablet (10 mg total) by mouth 3 (three) times daily as needed. For muscle spasms, Disp: 40 tablet, Rfl: 2 .  dicyclomine (BENTYL) 10 MG capsule, Take 1 capsule (10 mg total) by mouth 2 (two) times daily before a meal., Disp: 60 capsule, Rfl: 3 .  diphenhydrAMINE (BENADRYL) 25 MG tablet, Take 1 tablet (25 mg total) by mouth every 6 (six) hours., Disp: 20 tablet, Rfl: 0 .  EPINEPHrine (EPIPEN) 0.3 mg/0.3 mL SOAJ injection, Inject 0.3 mLs (0.3 mg total) into the muscle once., Disp: 1 Device, Rfl: 0 .  ondansetron (ZOFRAN) 4 MG tablet, Take 1 tablet (4 mg total) by mouth every 8 (eight) hours as needed for nausea or vomiting., Disp:  30 tablet, Rfl: 1 .  pantoprazole (PROTONIX) 40 MG tablet, Take 1 tablet (40 mg total) by mouth 2 (two) times daily before a meal., Disp: 60 tablet, Rfl: 5 .  VENTOLIN HFA 108 (90 BASE) MCG/ACT inhaler, Inhale 2 puffs into the lungs every 6 (six) hours as needed. for wheezing, Disp: , Rfl: 0 .  Wheat Dextrin (BENEFIBER DRINK MIX) PACK, Take 4 g by mouth at bedtime., Disp: , Rfl:  .  predniSONE (STERAPRED UNI-PAK 21 TAB) 5 MG (21) TBPK tablet, Take 1 tablet (5 mg total) by mouth daily., Disp: 60 tablet, Rfl: 0  BP 143/92 mmHg  Ht 5' 11.5" (1.816 m)  Wt 256 lb (116.121 kg)  BMI 35.21 kg/m2  Physical Exam  Constitutional: She is oriented to person, place, and time. She appears well-developed and  well-nourished. No distress.  Cardiovascular: Normal rate and intact distal pulses.   Neurological: She is alert and oriented to person, place, and time. She has normal reflexes. She exhibits normal muscle tone. Coordination normal.  Skin: Skin is warm and dry. No rash noted. She is not diaphoretic. No erythema. No pallor.  Psychiatric: She has a normal mood and affect. Her behavior is normal. Judgment and thought content normal.    Ortho Exam  She has pain over the left hand over the hamate tenderness there. Normal range of motion strength and stability in the hand skin of the hand is normal good capillary refill in the hand epitrochlear lymph nodes negative sensation normal including carpal tunnel  Right hip flexion causes minimal discomfort no instability normal strength in the hip flexors no tenderness around the hip skin around the hip is normal good pulses distally and normal sensation  Ambulatory status no evidence of Amer the tori problems  The knees no joint effusion today mild joint line tenderness in each knee flexion greater than 110 and stability is normal  ASSESSMENT: My personal interpretation of the images:  X-rays of the hand were normal  X-rays hip normal   PLAN She cannot take anti-inflammatories per GI  Recommend prednisone 5 mg titrated up to response  Follow-up September

## 2016-03-11 DIAGNOSIS — M25561 Pain in right knee: Secondary | ICD-10-CM | POA: Diagnosis not present

## 2016-03-11 DIAGNOSIS — M79671 Pain in right foot: Secondary | ICD-10-CM | POA: Diagnosis not present

## 2016-03-11 DIAGNOSIS — M79641 Pain in right hand: Secondary | ICD-10-CM | POA: Diagnosis not present

## 2016-03-11 DIAGNOSIS — M25551 Pain in right hip: Secondary | ICD-10-CM | POA: Diagnosis not present

## 2016-03-11 DIAGNOSIS — M25562 Pain in left knee: Secondary | ICD-10-CM | POA: Diagnosis not present

## 2016-03-25 DIAGNOSIS — M79642 Pain in left hand: Secondary | ICD-10-CM | POA: Diagnosis not present

## 2016-03-25 DIAGNOSIS — M79641 Pain in right hand: Secondary | ICD-10-CM | POA: Diagnosis not present

## 2016-04-16 DIAGNOSIS — M25561 Pain in right knee: Secondary | ICD-10-CM | POA: Diagnosis not present

## 2016-04-16 DIAGNOSIS — K5090 Crohn's disease, unspecified, without complications: Secondary | ICD-10-CM | POA: Diagnosis not present

## 2016-04-16 DIAGNOSIS — M79641 Pain in right hand: Secondary | ICD-10-CM | POA: Diagnosis not present

## 2016-04-16 DIAGNOSIS — M25531 Pain in right wrist: Secondary | ICD-10-CM | POA: Diagnosis not present

## 2016-04-21 DIAGNOSIS — R51 Headache: Secondary | ICD-10-CM | POA: Diagnosis not present

## 2016-04-21 DIAGNOSIS — Z6834 Body mass index (BMI) 34.0-34.9, adult: Secondary | ICD-10-CM | POA: Diagnosis not present

## 2016-04-21 DIAGNOSIS — I10 Essential (primary) hypertension: Secondary | ICD-10-CM | POA: Diagnosis not present

## 2016-04-21 DIAGNOSIS — K5080 Crohn's disease of both small and large intestine without complications: Secondary | ICD-10-CM | POA: Diagnosis not present

## 2016-04-21 DIAGNOSIS — Z1389 Encounter for screening for other disorder: Secondary | ICD-10-CM | POA: Diagnosis not present

## 2016-04-25 ENCOUNTER — Emergency Department (HOSPITAL_COMMUNITY): Payer: 59

## 2016-04-25 ENCOUNTER — Observation Stay (HOSPITAL_COMMUNITY)
Admission: EM | Admit: 2016-04-25 | Discharge: 2016-04-27 | Disposition: A | Discharge status: Home / Self Care | Payer: 59 | Attending: Internal Medicine | Admitting: Internal Medicine

## 2016-04-25 ENCOUNTER — Encounter (HOSPITAL_COMMUNITY): Payer: Self-pay | Admitting: *Deleted

## 2016-04-25 DIAGNOSIS — Z885 Allergy status to narcotic agent status: Secondary | ICD-10-CM | POA: Insufficient documentation

## 2016-04-25 DIAGNOSIS — R0789 Other chest pain: Secondary | ICD-10-CM | POA: Diagnosis not present

## 2016-04-25 DIAGNOSIS — R200 Anesthesia of skin: Secondary | ICD-10-CM | POA: Insufficient documentation

## 2016-04-25 DIAGNOSIS — R072 Precordial pain: Secondary | ICD-10-CM | POA: Diagnosis not present

## 2016-04-25 DIAGNOSIS — E669 Obesity, unspecified: Secondary | ICD-10-CM | POA: Insufficient documentation

## 2016-04-25 DIAGNOSIS — M79602 Pain in left arm: Secondary | ICD-10-CM | POA: Diagnosis not present

## 2016-04-25 DIAGNOSIS — R0602 Shortness of breath: Secondary | ICD-10-CM | POA: Diagnosis not present

## 2016-04-25 DIAGNOSIS — Z91018 Allergy to other foods: Secondary | ICD-10-CM | POA: Diagnosis not present

## 2016-04-25 DIAGNOSIS — M542 Cervicalgia: Secondary | ICD-10-CM | POA: Insufficient documentation

## 2016-04-25 DIAGNOSIS — Z91038 Other insect allergy status: Secondary | ICD-10-CM | POA: Diagnosis not present

## 2016-04-25 DIAGNOSIS — Z91048 Other nonmedicinal substance allergy status: Secondary | ICD-10-CM | POA: Diagnosis not present

## 2016-04-25 DIAGNOSIS — G4730 Sleep apnea, unspecified: Secondary | ICD-10-CM | POA: Diagnosis not present

## 2016-04-25 DIAGNOSIS — I10 Essential (primary) hypertension: Secondary | ICD-10-CM | POA: Insufficient documentation

## 2016-04-25 DIAGNOSIS — J45909 Unspecified asthma, uncomplicated: Secondary | ICD-10-CM | POA: Insufficient documentation

## 2016-04-25 DIAGNOSIS — R079 Chest pain, unspecified: Secondary | ICD-10-CM | POA: Insufficient documentation

## 2016-04-25 DIAGNOSIS — R739 Hyperglycemia, unspecified: Secondary | ICD-10-CM | POA: Insufficient documentation

## 2016-04-25 DIAGNOSIS — R9431 Abnormal electrocardiogram [ECG] [EKG]: Principal | ICD-10-CM | POA: Insufficient documentation

## 2016-04-25 DIAGNOSIS — Z6834 Body mass index (BMI) 34.0-34.9, adult: Secondary | ICD-10-CM | POA: Insufficient documentation

## 2016-04-25 DIAGNOSIS — E876 Hypokalemia: Secondary | ICD-10-CM | POA: Insufficient documentation

## 2016-04-25 DIAGNOSIS — K219 Gastro-esophageal reflux disease without esophagitis: Secondary | ICD-10-CM | POA: Diagnosis not present

## 2016-04-25 DIAGNOSIS — Z8719 Personal history of other diseases of the digestive system: Secondary | ICD-10-CM | POA: Diagnosis not present

## 2016-04-25 DIAGNOSIS — K5090 Crohn's disease, unspecified, without complications: Secondary | ICD-10-CM | POA: Diagnosis not present

## 2016-04-25 HISTORY — DX: Diverticulosis of intestine, part unspecified, without perforation or abscess without bleeding: K57.90

## 2016-04-25 LAB — CBC WITH DIFFERENTIAL/PLATELET
BASOS ABS: 0 10*3/uL (ref 0.0–0.1)
Basophils Relative: 1 %
Eosinophils Absolute: 0.2 10*3/uL (ref 0.0–0.7)
Eosinophils Relative: 3 %
HCT: 38.6 % (ref 36.0–46.0)
HEMOGLOBIN: 12.6 g/dL (ref 12.0–15.0)
LYMPHS ABS: 1.9 10*3/uL (ref 0.7–4.0)
Lymphocytes Relative: 31 %
MCH: 28.3 pg (ref 26.0–34.0)
MCHC: 32.6 g/dL (ref 30.0–36.0)
MCV: 86.5 fL (ref 78.0–100.0)
Monocytes Absolute: 0.6 10*3/uL (ref 0.1–1.0)
Monocytes Relative: 10 %
NEUTROS ABS: 3.4 10*3/uL (ref 1.7–7.7)
NEUTROS PCT: 55 %
Platelets: 349 10*3/uL (ref 150–400)
RBC: 4.46 MIL/uL (ref 3.87–5.11)
RDW: 14.3 % (ref 11.5–15.5)
WBC: 6.1 10*3/uL (ref 4.0–10.5)

## 2016-04-25 LAB — COMPREHENSIVE METABOLIC PANEL
ALK PHOS: 51 U/L (ref 38–126)
ALT: 18 U/L (ref 14–54)
AST: 17 U/L (ref 15–41)
Albumin: 4 g/dL (ref 3.5–5.0)
Anion gap: 4 — ABNORMAL LOW (ref 5–15)
BUN: 13 mg/dL (ref 6–20)
CALCIUM: 9.4 mg/dL (ref 8.9–10.3)
CO2: 29 mmol/L (ref 22–32)
CREATININE: 0.96 mg/dL (ref 0.44–1.00)
Chloride: 106 mmol/L (ref 101–111)
Glucose, Bld: 102 mg/dL — ABNORMAL HIGH (ref 65–99)
Potassium: 3.3 mmol/L — ABNORMAL LOW (ref 3.5–5.1)
Sodium: 139 mmol/L (ref 135–145)
Total Bilirubin: 0.3 mg/dL (ref 0.3–1.2)
Total Protein: 7.3 g/dL (ref 6.5–8.1)

## 2016-04-25 LAB — I-STAT TROPONIN, ED: Troponin i, poc: 0 ng/mL (ref 0.00–0.08)

## 2016-04-25 LAB — D-DIMER, QUANTITATIVE: D-Dimer, Quant: 0.39 ug/mL-FEU (ref 0.00–0.50)

## 2016-04-25 LAB — APTT: aPTT: 29 seconds (ref 24–37)

## 2016-04-25 LAB — PROTIME-INR
INR: 0.98 (ref 0.00–1.49)
Prothrombin Time: 13.2 seconds (ref 11.6–15.2)

## 2016-04-25 LAB — TROPONIN I: Troponin I: <0.03 ng/mL (ref <0.03)

## 2016-04-25 LAB — BRAIN NATRIURETIC PEPTIDE: B NATRIURETIC PEPTIDE 5: 9 pg/mL (ref 0.0–100.0)

## 2016-04-25 LAB — LIPASE, BLOOD: LIPASE: 24 U/L (ref 11–51)

## 2016-04-25 MED ORDER — ALBUTEROL SULFATE (2.5 MG/3ML) 0.083% IN NEBU: 2.5000 mg | INHALATION_SOLUTION | Freq: Four times a day (QID) | RESPIRATORY_TRACT | Status: DC | PRN

## 2016-04-25 MED ORDER — ACETAMINOPHEN 650 MG RE SUPP: 650.0000 mg | Freq: Four times a day (QID) | RECTAL | Status: DC | PRN

## 2016-04-25 MED ORDER — ASPIRIN 81 MG PO CHEW
324.0000 mg | CHEWABLE_TABLET | Freq: Once | ORAL | Status: AC
  Administered 2016-04-25: 324 mg via ORAL
  Filled 2016-04-25: qty 4

## 2016-04-25 MED ORDER — SODIUM CHLORIDE 0.9% FLUSH
3.0000 mL | INTRAVENOUS | Status: DC | PRN
Start: 2016-04-25 — End: 2016-04-27

## 2016-04-25 MED ORDER — ATORVASTATIN CALCIUM 80 MG PO TABS: 80.0000 mg | ORAL_TABLET | Freq: Every day | ORAL | Status: DC

## 2016-04-25 MED ORDER — FAMOTIDINE 20 MG PO TABS
20.0000 mg | ORAL_TABLET | Freq: Once | ORAL | Status: AC
  Administered 2016-04-25: 20 mg via ORAL
  Filled 2016-04-25: qty 1

## 2016-04-25 MED ORDER — MOMETASONE FURO-FORMOTEROL FUM 100-5 MCG/ACT IN AERO
2.0000 | INHALATION_SPRAY | Freq: Two times a day (BID) | RESPIRATORY_TRACT | Status: DC
  Administered 2016-04-26 – 2016-04-27 (×3): 2 via RESPIRATORY_TRACT
  Filled 2016-04-25: qty 8.8

## 2016-04-25 MED ORDER — POTASSIUM CHLORIDE CRYS ER 20 MEQ PO TBCR
20.0000 meq | EXTENDED_RELEASE_TABLET | Freq: Once | ORAL | Status: AC
  Administered 2016-04-25: 20 meq via ORAL
  Filled 2016-04-25: qty 1

## 2016-04-25 MED ORDER — SODIUM CHLORIDE 0.9% FLUSH
3.0000 mL | Freq: Two times a day (BID) | INTRAVENOUS | Status: DC
  Administered 2016-04-25: 3 mL via INTRAVENOUS

## 2016-04-25 MED ORDER — ONDANSETRON HCL 4 MG PO TABS: 4.0000 mg | ORAL_TABLET | Freq: Three times a day (TID) | ORAL | Status: DC | PRN

## 2016-04-25 MED ORDER — ACETAMINOPHEN 325 MG PO TABS
650.0000 mg | ORAL_TABLET | Freq: Four times a day (QID) | ORAL | Status: DC | PRN
  Administered 2016-04-26: 650 mg via ORAL
  Filled 2016-04-25: qty 2

## 2016-04-25 MED ORDER — NITROGLYCERIN 0.4 MG SL SUBL
0.4000 mg | SUBLINGUAL_TABLET | SUBLINGUAL | Status: DC | PRN
  Administered 2016-04-25 (×2): 0.4 mg via SUBLINGUAL
  Filled 2016-04-25: qty 1

## 2016-04-25 MED ORDER — GI COCKTAIL ~~LOC~~
30.0000 mL | Freq: Once | ORAL | Status: AC
  Administered 2016-04-25: 30 mL via ORAL
  Filled 2016-04-25: qty 30

## 2016-04-25 MED ORDER — ENOXAPARIN SODIUM 40 MG/0.4ML ~~LOC~~ SOLN
40.0000 mg | Freq: Every day | SUBCUTANEOUS | Status: DC
  Administered 2016-04-26: 40 mg via SUBCUTANEOUS
  Filled 2016-04-25: qty 0.4

## 2016-04-25 MED ORDER — MORPHINE SULFATE (PF) 2 MG/ML IV SOLN: 1.0000 mg | INTRAVENOUS | Status: DC | PRN

## 2016-04-25 MED ORDER — MORPHINE SULFATE (PF) 2 MG/ML IV SOLN
2.0000 mg | Freq: Once | INTRAVENOUS | Status: AC
Start: 2016-04-25 — End: 2016-04-25
  Administered 2016-04-25: 2 mg via INTRAVENOUS
  Filled 2016-04-25: qty 1

## 2016-04-25 MED ORDER — SODIUM CHLORIDE 0.9% FLUSH
3.0000 mL | Freq: Two times a day (BID) | INTRAVENOUS | Status: DC
  Administered 2016-04-26: 3 mL via INTRAVENOUS

## 2016-04-25 MED ORDER — ASPIRIN EC 325 MG PO TBEC
325.0000 mg | DELAYED_RELEASE_TABLET | Freq: Every day | ORAL | Status: DC
  Administered 2016-04-26 – 2016-04-27 (×2): 325 mg via ORAL
  Filled 2016-04-25 (×2): qty 1

## 2016-04-25 MED ORDER — LOSARTAN POTASSIUM 50 MG PO TABS
100.0000 mg | ORAL_TABLET | Freq: Every day | ORAL | Status: DC
  Administered 2016-04-26 – 2016-04-27 (×2): 100 mg via ORAL
  Filled 2016-04-25 (×2): qty 2

## 2016-04-25 MED ORDER — SODIUM CHLORIDE 0.9 % IV SOLN
250.0000 mL | INTRAVENOUS | Status: DC | PRN
Start: 2016-04-25 — End: 2016-04-27

## 2016-04-25 NOTE — Progress Notes (Signed)
D/w cardiology fellow, cardiology will consult on the case, appreciate their input

## 2016-04-25 NOTE — ED Provider Notes (Signed)
CSN: [id number]     Arrival date & time 04/25/16  1924 History   First MD Initiated Contact with Patient 04/25/16 1935     Chief Complaint  Patient presents with  . Chest Pain     (Consider location/radiation/quality/duration/timing/severity/associated sxs/prior Treatment) Patient is a 50 y.o. female presenting with chest pain.  Chest Pain  50 y.o. Female presents today with one week history of sscp.  Patient states describes the pain as epigastric to substernal and pressure in nature.  She feels it is worse with exertion.  The pain has been worse since Wednesday.  She was seen by her pmd on Tuesday and bp was noted to be elevated and started on losartan 100 mg q day.  EKG done by patient's coworkers earlier today showed t wave inversion across precordium. Patient denies history of pe or dvt.  She has been seen by Dr. Sung Amabile previously and had cath in 2004 and 2012- she reports no significant cad alhtough last cath had some elevated right sided pressures.   She reports some nausea but no vomiting.  She has a history of hiatal hernia.  Past Medical History  Diagnosis Date  . Chest pain     cath 2005 norm cors, repeat cath Feb 2011 normal cors and only minimally  elevated pulmonary pressures  . Palpitations   . Obesity   . Hiatal hernia   . Sleep apnea sleep study 11/03/2009    mild-no cpap  . GERD (gastroesophageal reflux disease)   . Crohn disease [facility])    Past Surgical History  Procedure Laterality Date  . Cesarean section    . Abdominal hysterectomy    . Shoulder surgery      right x 2   . Cholecystectomy  09/08/2006    lap. chole.  . Inguinal hernia repair  10/30/2008    right  . Ankle arthroscopy  06/01/2012    Procedure: ANKLE ARTHROSCOPY;  Surgeon: Colin Rhein, MD;  Location: [place];  Service: Orthopedics;  Laterality: Left;  left ankle arthorsocpy with extensive debridement and gastroc slide  . Cardiac catheterization  11/22/2009 and 2005    WNL  .  Chondroplasty  06/17/2012    Procedure: CHONDROPLASTY;  Surgeon: Carole Civil, MD;  Location: [facility];  Service: Orthopedics;  Laterality: Right;  right patella  . Esophagogastroduodenoscopy N/A 05/14/2015    Procedure: ESOPHAGOGASTRODUODENOSCOPY (EGD);  Surgeon: Rogene Houston, MD;  [facility]: [facility];  Service: Endoscopy;  Laterality: N/A;  730   Family History  Problem Relation Age of Onset  . Hypertension Mother   . Lupus Father   . COPD Father    Social History  Substance Use Topics  . Smoking status: Never Smoker   . Smokeless tobacco: Never Used  . Alcohol Use: No   OB History    No data available     Review of Systems  Cardiovascular: Positive for chest pain.  All other systems reviewed and are negative.     Allergies  Other; Wasp venom; Hydromorphone hcl; Omnipaque; Pollen extract; and Adhesive  Home Medications   Prior to Admission medications   Medication Sig Start Date End Date Taking? Authorizing Provider  albuterol (PROVENTIL) (2.5 MG/3ML) 0.083% nebulizer solution Take 2.5 mg by nebulization every 6 (six) hours as needed. For shortness of breath    Historical Provider, MD  cetirizine (ZYRTEC) 10 MG tablet Take 10 mg by mouth daily as needed. For Allergies    Historical Provider, MD  cyclobenzaprine (FLEXERIL) 10 MG tablet Take 1 tablet (10 mg total) by mouth 3 (three) times daily as needed. For muscle spasms 01/29/16   Carole Civil, MD  dicyclomine (BENTYL) 10 MG capsule Take 1 capsule (10 mg total) by mouth 2 (two) times daily before a meal. 05/02/15   Butch Penny, NP  diphenhydrAMINE (BENADRYL) 25 MG tablet Take 1 tablet (25 mg total) by mouth every 6 (six) hours. 12/15/13   Linton Flemings, MD  EPINEPHrine (EPIPEN) 0.3 mg/0.3 mL SOAJ injection Inject 0.3 mLs (0.3 mg total) into the muscle once. 12/15/13   Linton Flemings, MD  ondansetron (ZOFRAN) 4 MG tablet Take 1 tablet (4 mg total) by mouth every 8 (eight) hours as needed for nausea or vomiting.  07/17/14   Butch Penny, NP  pantoprazole (PROTONIX) 40 MG tablet Take 1 tablet (40 mg total) by mouth 2 (two) times daily before a meal. 05/14/15   Rogene Houston, MD  predniSONE (STERAPRED UNI-PAK 21 TAB) 5 MG (21) TBPK tablet Take 1 tablet (5 mg total) by mouth daily. 01/29/16   Carole Civil, MD  VENTOLIN HFA 108 (90 BASE) MCG/ACT inhaler Inhale 2 puffs into the lungs every 6 (six) hours as needed. for wheezing 07/13/15   Historical Provider, MD  Wheat Dextrin (BENEFIBER DRINK MIX) PACK Take 4 g by mouth at bedtime. 07/30/15   Rogene Houston, MD   There were no vitals taken for this visit. Physical Exam  Constitutional: She is oriented to person, place, and time. She appears well-developed and well-nourished. No distress.  HENT:  Head: Normocephalic and atraumatic.  Right Ear: External ear normal.  Left Ear: External ear normal.  Nose: Nose normal.  Mouth/Throat: Oropharynx is clear and moist.  Eyes: Pupils are equal, round, and reactive to light.  Neck: Normal range of motion. Neck supple.  Cardiovascular: Normal rate, regular rhythm, normal heart sounds and intact distal pulses.   Pulmonary/Chest: Effort normal and breath sounds normal. No respiratory distress. She has no wheezes.  Abdominal: Soft.  Mild epigastric ttp  Musculoskeletal: Normal range of motion.  Trace edema bilaterally  Neurological: She is alert and oriented to person, place, and time.  Skin: Skin is warm and dry.  Psychiatric: She has a normal mood and affect. Her behavior is normal. Judgment and thought content normal.  Nursing note and vitals reviewed.   ED Course  Procedures (including critical care time) Labs Review Labs Reviewed  COMPREHENSIVE METABOLIC PANEL - Abnormal; Notable for the following:    Potassium 3.3 (*)    Glucose, Bld 102 (*)    Anion gap 4 (*)    All other components within normal limits  CBC WITH DIFFERENTIAL/PLATELET  TROPONIN I  APTT  PROTIME-INR  LIPASE, BLOOD  D-DIMER,  QUANTITATIVE (NOT AT [facility])  BRAIN NATRIURETIC PEPTIDE  I-STAT TROPOININ, ED    Imaging Review No results found. I have personally reviewed and evaluated these images and lab results as part of my medical decision-making.   EKG Interpretation   Date/Time:  Saturday April 25 2016 19:37:50 EDT Ventricular Rate:  77 PR Interval:    QRS Duration: 98 QT Interval:  380 QTC Calculation: 430 R Axis:   48 Text Interpretation:  Sinus rhythm Borderline T abnormalities, anterior  leads No significant change since last tracing Confirmed by Anylah Scheib MD,  Andee Poles ([id number]) on 04/25/2016 7:45:58 PM      MDM   Final diagnoses:  Abnormal EKG   50 y.o. Female presents with  chest discomfort for one week worse for 3 days with increasing discomfort.  She has had some diaphoresis.  EKG done pre ED significant for diffuse t wave inversion.  Patient with repeat ekg with resolution. Initial troponin negative.  Patient also with some epigastric discomfort, but normal lft and lipase- known hiatal hernia, may need US gb if cardiac work up negative.    Pattricia Boss, MD 04/25/16 2245

## 2016-04-25 NOTE — ED Notes (Addendum)
Pt reports constant, centralized chest pressure since this morning. Pt reports nausea, dizziness, diaphoresis, and sob. Pt has been taking tums today. Pt started a new BP med on Tuesday.

## 2016-04-25 NOTE — H&P (Addendum)
TRH H&P   Patient Demographics:    [name], is a 50 y.o. female  MRN: [id number]   DOB - [birth year]  Admit Date - 04/25/2016  Outpatient Primary MD for the patient is [name] Kilts, MD  Referring MD/NP/PA:  Dr. Jeanell [name]  Outpatient Specialists: [name] (cardiologist)  Patient coming from: work at [employer] Complaint  Patient presents with  . Chest Pain      HPI:    [name]  is a 50 y.o. female, Gerd/hiatal hernia, Asthma,  apparently started to have chest pressure yesterday.  Took zantac and tums without relief.  Slight lightheaded.  Pt came to work today and developed chest pressure at work which became worse throughtout the day.  + dyspnea, + fatigue.  + diaphoresis.  Pt is still having some light chest pressure even after slg nitro x2, and morphine.  Pt apparently had an ekg upstairs on the floor at [facility] which showed diffuse T wave inversion per ED ?[name]    In ED ekg showed nsr at 77, nl axis, nl int, t inversion in v1, v2, v3.  Pt also noted to be hypokalemic. Pt will be admitted for w/up of chest pain.     Review of systems:    In addition to the HPI above,  No Fever-chills, No Headache, No changes with Vision or hearing, No problems swallowing food or Liquids, No , Cough No Abdominal pain, No Nausea or Vommitting, Bowel movements are regular, No Blood in stool or Urine, No dysuria, No new skin rashes or bruises, No new joints pains-aches,  No new weakness, tingling, numbness in any extremity, No recent weight gain or loss, No polyuria, polydypsia or polyphagia, No significant Mental Stressors.  A full 10 point Review of Systems was done, except as stated above, all other Review of Systems were negative.   With Past History of the following :    Past Medical History  Diagnosis Date  . Chest pain     cath 2005 norm cors,  repeat cath Feb 2011 normal cors and only minimally  elevated pulmonary pressures  . Palpitations   . Obesity   . Hiatal hernia   . Sleep apnea sleep study 11/03/2009    mild-no cpap  . GERD (gastroesophageal reflux disease)   . Crohn disease ([name])   . Diverticulosis       Past Surgical History  Procedure Laterality Date  . Cesarean section    . Abdominal hysterectomy    . Shoulder surgery      right x 2   . Cholecystectomy  09/08/2006    lap. chole.  . Inguinal hernia repair  10/30/2008    right  . Ankle arthroscopy  06/01/2012    Procedure: ANKLE ARTHROSCOPY;  Surgeon: Colin Rhein, MD;  Location: [place];  Service: Orthopedics;  Laterality: Left;  left ankle arthorsocpy with extensive debridement and gastroc slide  . Cardiac catheterization  11/22/2009 and 2005    WNL  . Chondroplasty  06/17/2012    Procedure: CHONDROPLASTY;  Surgeon: Carole Civil, MD;  Location: [facility];  Service: Orthopedics;  Laterality: Right;  right patella  . Esophagogastroduodenoscopy N/A 05/14/2015    Procedure: ESOPHAGOGASTRODUODENOSCOPY (EGD);  Surgeon: Rogene Houston, MD;  [facility];  Service: Endoscopy;  Laterality: N/A;  730      Social History:     Social History  Substance Use Topics  . Smoking status: Never Smoker   . Smokeless tobacco: Never Used  . Alcohol Use: No     Lives - at home  Mobility - ambulates without difficulty   Family History :     Family History  Problem Relation Age of Onset  . Hypertension Mother   . Lupus Father   . COPD Father   . Congestive Heart Failure Maternal Grandmother      Home Medications:   Prior to Admission medications   Medication Sig Start Date End Date Taking? Authorizing Provider  albuterol (PROVENTIL) (2.5 MG/3ML) 0.083% nebulizer solution Take 2.5 mg by nebulization every 6 (six) hours as needed. For shortness of breath   Yes Historical Provider, MD  EPINEPHrine (EPIPEN) 0.3 mg/0.3 mL SOAJ injection  Inject 0.3 mLs (0.3 mg total) into the muscle once. 12/15/13  Yes Linton Flemings, MD  losartan (COZAAR) 100 MG tablet Take 100 mg by mouth daily.   Yes Historical Provider, MD  ondansetron (ZOFRAN) 4 MG tablet Take 1 tablet (4 mg total) by mouth every 8 (eight) hours as needed for nausea or vomiting. 07/17/14  Yes Butch Penny, NP  cyclobenzaprine (FLEXERIL) 10 MG tablet Take 1 tablet (10 mg total) by mouth 3 (three) times daily as needed. For muscle spasms Patient not taking: Reported on 04/25/2016 01/29/16   Carole Civil, MD  dicyclomine (BENTYL) 10 MG capsule Take 1 capsule (10 mg total) by mouth 2 (two) times daily before a meal. Patient not taking: Reported on 04/25/2016 05/02/15   Butch Penny, NP  diphenhydrAMINE (BENADRYL) 25 MG tablet Take 1 tablet (25 mg total) by mouth every 6 (six) hours. Patient not taking: Reported on 04/25/2016 12/15/13   Linton Flemings, MD     Allergies:     Allergies  Allergen Reactions  . Other Shortness Of Breath    Lemon grass  . Wasp Venom Anaphylaxis and Shortness Of Breath  . Hydromorphone Hcl Nausea And Vomiting  . Omnipaque [Iohexol] Hives and Nausea Only    Pt. Was premedicated with emergent protocol.  . Pollen Extract Swelling  . Adhesive [Tape] Rash     Physical Exam:   Vitals  Blood pressure 135/93, pulse 70, temperature 98.2 F (36.8 C), temperature source Oral, resp. rate 15, height 6' (1.829 m), weight 113.399 kg (250 lb), SpO2 96 %.   1. General  lying in bed in NAD,    2. Normal affect and insight, Not Suicidal or Homicidal, Awake Alert, Oriented X 3.  3. No F.N deficits, ALL C.Nerves Intact, Strength 5/5 all 4 extremities, Sensation intact all 4 extremities, Plantars down going.  4. Ears and Eyes appear Normal, Conjunctivae clear, PERRLA. Moist Oral Mucosa.  5. Supple Neck, No JVD, No cervical lymphadenopathy appriciated, No Carotid Bruits.  6. Symmetrical Chest wall movement, Good air movement bilaterally, CTAB.  7. RRR, No  Gallops, Rubs or Murmurs, No Parasternal Heave.  8. Positive  Bowel Sounds, Abdomen Soft, No tenderness, No organomegaly appriciated,No rebound -guarding or rigidity.  9.  No Cyanosis, Normal Skin Turgor, No Skin Rash or Bruise.  10. Good muscle tone,  joints appear normal , no effusions, Normal ROM.  11. No Palpable Lymph Nodes in Neck or Axillae     Data Review:    CBC  Recent Labs Lab 04/25/16 1935  WBC 6.1  HGB 12.6  HCT 38.6  PLT 349  MCV 86.5  MCH 28.3  MCHC 32.6  RDW 14.3  LYMPHSABS 1.9  MONOABS 0.6  EOSABS 0.2  BASOSABS 0.0   ------------------------------------------------------------------------------------------------------------------  Chemistries   Recent Labs Lab 04/25/16 1935  NA 139  K 3.3*  CL 106  CO2 29  GLUCOSE 102*  BUN 13  CREATININE 0.96  CALCIUM 9.4  AST 17  ALT 18  ALKPHOS 51  BILITOT 0.3   ------------------------------------------------------------------------------------------------------------------ estimated creatinine clearance is 100.9 mL/min (by C-G formula based on Cr of 0.96). ------------------------------------------------------------------------------------------------------------------ No results for input(s): TSH, T4TOTAL, T3FREE, THYROIDAB in the last 72 hours.  Invalid input(s): FREET3  Coagulation profile  Recent Labs Lab 04/25/16 1935  INR 0.98   -------------------------------------------------------------------------------------------------------------------  Recent Labs  04/25/16 1952  DDIMER 0.39   -------------------------------------------------------------------------------------------------------------------  Cardiac Enzymes  Recent Labs Lab 04/25/16 1935  TROPONINI <0.03   ------------------------------------------------------------------------------------------------------------------    Component Value Date/Time   BNP 9.0 04/25/2016 1935      ---------------------------------------------------------------------------------------------------------------  Urinalysis    Component Value Date/Time   COLORURINE YELLOW 01/02/2011 0845   APPEARANCEUR CLEAR 01/02/2011 0845   LABSPEC 1.020 01/02/2011 0845   PHURINE 7.0 01/02/2011 0845   GLUCOSEU NEGATIVE 01/02/2011 0845   HGBUR NEGATIVE 01/02/2011 0845   BILIRUBINUR NEGATIVE 01/02/2011 0845   KETONESUR NEGATIVE 01/02/2011 0845   PROTEINUR NEGATIVE 01/02/2011 0845   UROBILINOGEN 0.2 01/02/2011 0845   NITRITE NEGATIVE 01/02/2011 0845   LEUKOCYTESUR  01/02/2011 0845    NEGATIVE MICROSCOPIC NOT DONE ON URINES WITH NEGATIVE PROTEIN, BLOOD, LEUKOCYTES, NITRITE, OR GLUCOSE <1000 mg/dL.    ----------------------------------------------------------------------------------------------------------------   Imaging Results:    Dg Chest 2 View  04/25/2016  CLINICAL DATA:  Chest pressure beginning this morning. Neck pain radiating to upper back. Nausea, diaphoresis, shortness of breath and dizziness. EXAM: CHEST  2 VIEW COMPARISON:  05/09/2015 FINDINGS: Lungs are adequately inflated without focal consolidation or effusion. Cardiomediastinal silhouette is within normal. There is degenerative change of the spine. IMPRESSION: No active cardiopulmonary disease. Electronically Signed   By: Marin Olp M.D.   On: 04/25/2016 20:18       Assessment & Plan:    Active Problems:   Chest pain   Hypokalemia   Hyperglycemia    1. Cp Tele Trop i q6h x3,  Check cardiac echo Transfer to [facility] for evaluation by cardiology Aspirin, Lipitor Check lipid in am  2.  Hypokalemia Pt given potassium in ED Check cmp in am  3. Gerd Pepcid, and gi cocktail given in Ed.   4. Asthma Albuterol neb prn Start dulera   5. Hyperglycemia Check hga1c     DVT Prophylaxis Lovenox - SCDs   AM Labs Ordered, also please review Full Orders  Family Communication: Admission, patients condition and  plan of care including tests being ordered have been discussed with the patient  who indicate understanding and agree with the plan and Code Status.  Code Status FULL CODE  Likely DC to  home  Condition GUARDED    Consults called: cardiology   Admission status: observation  Time  spent in minutes : 45 minutes   Jani Gravel M.D on 04/25/2016 at 9:51 PM  Between 7am to 7pm - Pager - [phone]  After 7pm go to [email or web address] - password [facility]  [facility] - Office  [phone]

## 2016-04-26 ENCOUNTER — Other Ambulatory Visit (HOSPITAL_COMMUNITY): Payer: Self-pay

## 2016-04-26 ENCOUNTER — Encounter (HOSPITAL_COMMUNITY): Payer: Self-pay | Admitting: Internal Medicine

## 2016-04-26 DIAGNOSIS — R072 Precordial pain: Secondary | ICD-10-CM

## 2016-04-26 DIAGNOSIS — Z6834 Body mass index (BMI) 34.0-34.9, adult: Secondary | ICD-10-CM | POA: Diagnosis not present

## 2016-04-26 DIAGNOSIS — R079 Chest pain, unspecified: Secondary | ICD-10-CM | POA: Diagnosis not present

## 2016-04-26 DIAGNOSIS — R0789 Other chest pain: Secondary | ICD-10-CM

## 2016-04-26 DIAGNOSIS — Z91018 Allergy to other foods: Secondary | ICD-10-CM | POA: Diagnosis not present

## 2016-04-26 DIAGNOSIS — Z91048 Other nonmedicinal substance allergy status: Secondary | ICD-10-CM | POA: Diagnosis not present

## 2016-04-26 DIAGNOSIS — R9431 Abnormal electrocardiogram [ECG] [EKG]: Secondary | ICD-10-CM | POA: Diagnosis not present

## 2016-04-26 DIAGNOSIS — I10 Essential (primary) hypertension: Secondary | ICD-10-CM | POA: Diagnosis not present

## 2016-04-26 DIAGNOSIS — Z91038 Other insect allergy status: Secondary | ICD-10-CM | POA: Diagnosis not present

## 2016-04-26 DIAGNOSIS — Z8719 Personal history of other diseases of the digestive system: Secondary | ICD-10-CM | POA: Diagnosis not present

## 2016-04-26 DIAGNOSIS — Z885 Allergy status to narcotic agent status: Secondary | ICD-10-CM | POA: Diagnosis not present

## 2016-04-26 LAB — LIPID PANEL
CHOL/HDL RATIO: 4 ratio
CHOLESTEROL: 165 mg/dL (ref 0–200)
HDL: 41 mg/dL (ref >40)
LDL Cholesterol: 88 mg/dL (ref 0–99)
TRIGLYCERIDES: 181 mg/dL — AB (ref <150)
VLDL: 36 mg/dL (ref 0–40)

## 2016-04-26 LAB — CBC
HEMATOCRIT: 37.9 % (ref 36.0–46.0)
HEMOGLOBIN: 11.8 g/dL — AB (ref 12.0–15.0)
MCH: 27 pg (ref 26.0–34.0)
MCHC: 31.1 g/dL (ref 30.0–36.0)
MCV: 86.7 fL (ref 78.0–100.0)
Platelets: 316 10*3/uL (ref 150–400)
RBC: 4.37 MIL/uL (ref 3.87–5.11)
RDW: 14.5 % (ref 11.5–15.5)
WBC: 5.1 10*3/uL (ref 4.0–10.5)

## 2016-04-26 LAB — MRSA PCR SCREENING: MRSA by PCR: NEGATIVE

## 2016-04-26 LAB — TROPONIN I: Troponin I: <0.03 ng/mL (ref <0.03)

## 2016-04-26 MED ORDER — ALPRAZOLAM 0.25 MG PO TABS: 0.2500 mg | ORAL_TABLET | Freq: Three times a day (TID) | ORAL | Status: DC | PRN

## 2016-04-26 MED ORDER — OXYCODONE-ACETAMINOPHEN 5-325 MG PO TABS
1.0000 | ORAL_TABLET | Freq: Four times a day (QID) | ORAL | Status: DC | PRN
  Administered 2016-04-26 – 2016-04-27 (×2): 1 via ORAL
  Filled 2016-04-26 (×2): qty 1

## 2016-04-26 MED ORDER — PNEUMOCOCCAL VAC POLYVALENT 25 MCG/0.5ML IJ INJ
0.5000 mL | INJECTION | INTRAMUSCULAR | Status: DC
  Filled 2016-04-26: qty 0.5

## 2016-04-26 MED ORDER — MORPHINE SULFATE (PF) 2 MG/ML IV SOLN
2.0000 mg | INTRAVENOUS | Status: DC | PRN
  Administered 2016-04-26: 2 mg via INTRAVENOUS
  Filled 2016-04-26: qty 1

## 2016-04-26 NOTE — Progress Notes (Addendum)
Subjective: Pt still with some chest prssure   Breathing is fair   Objective: Vitals:   04/25/16 2347 04/26/16 0500 04/26/16 0753 04/26/16 0916  BP: 140/85 130/85 122/86   Pulse: 64 79 63   Resp: (!) 21 16 15    Temp: 98.6 F (37 C) 97.7 F (36.5 C) 98 F (36.7 C)   TempSrc:   Oral   SpO2: 98% 100% 100% 97%  Weight: 256 lb 14.4 oz (116.5 kg) 256 lb 14.4 oz (116.5 kg)    Height: 6' (1.829 m)      Weight change:   Intake/Output Summary (Last 24 hours) at 04/26/16 0930 Last data filed at 04/26/16 0500  Gross per 24 hour  Intake              123 ml  Output              400 ml  Net             -277 ml    General: Alert, awake, oriented x3, in no acute distress Neck:  JVP is normal Heart: Regular rate and rhythm, without murmurs, rubs, gallops.  Chest  Nontender   Lungs: Clear to auscultation.  No rales or wheezes. Exemities:  No edema.   Neuro: Grossly intact, nonfocal. Tele:  SR    Lab Results: Results for orders placed or performed during the hospital encounter of 04/25/16 (from the past 24 hour(s))  CBC with Differential     Status: None   Collection Time: 04/25/16  7:35 PM  Result Value Ref Range   WBC 6.1 4.0 - 10.5 K/uL   RBC 4.46 3.87 - 5.11 MIL/uL   Hemoglobin 12.6 12.0 - 15.0 g/dL   HCT 38.6 36.0 - 46.0 %   MCV 86.5 78.0 - 100.0 fL   MCH 28.3 26.0 - 34.0 pg   MCHC 32.6 30.0 - 36.0 g/dL   RDW 14.3 11.5 - 15.5 %   Platelets 349 150 - 400 K/uL   Neutrophils Relative % 55 %   Neutro Abs 3.4 1.7 - 7.7 K/uL   Lymphocytes Relative 31 %   Lymphs Abs 1.9 0.7 - 4.0 K/uL   Monocytes Relative 10 %   Monocytes Absolute 0.6 0.1 - 1.0 K/uL   Eosinophils Relative 3 %   Eosinophils Absolute 0.2 0.0 - 0.7 K/uL   Basophils Relative 1 %   Basophils Absolute 0.0 0.0 - 0.1 K/uL  Comprehensive metabolic panel     Status: Abnormal   Collection Time: 04/25/16  7:35 PM  Result Value Ref Range   Sodium 139 135 - 145 mmol/L   Potassium 3.3 (L) 3.5 - 5.1 mmol/L   Chloride  106 101 - 111 mmol/L   CO2 29 22 - 32 mmol/L   Glucose, Bld 102 (H) 65 - 99 mg/dL   BUN 13 6 - 20 mg/dL   Creatinine, Ser 0.96 0.44 - 1.00 mg/dL   Calcium 9.4 8.9 - 10.3 mg/dL   Total Protein 7.3 6.5 - 8.1 g/dL   Albumin 4.0 3.5 - 5.0 g/dL   AST 17 15 - 41 U/L   ALT 18 14 - 54 U/L   Alkaline Phosphatase 51 38 - 126 U/L   Total Bilirubin 0.3 0.3 - 1.2 mg/dL   GFR calc non Af Amer >60 >60 mL/min   GFR calc Af Amer >60 >60 mL/min   Anion gap 4 (L) 5 - 15  Troponin I     Status: None  Collection Time: 04/25/16  7:35 PM  Result Value Ref Range   Troponin I <0.03 <0.03 ng/mL  APTT     Status: None   Collection Time: 04/25/16  7:35 PM  Result Value Ref Range   aPTT 29 24 - 37 seconds  Protime-INR     Status: None   Collection Time: 04/25/16  7:35 PM  Result Value Ref Range   Prothrombin Time 13.2 11.6 - 15.2 seconds   INR 0.98 0.00 - 1.49  Lipase, blood     Status: None   Collection Time: 04/25/16  7:35 PM  Result Value Ref Range   Lipase 24 11 - 51 U/L  Brain natriuretic peptide     Status: None   Collection Time: 04/25/16  7:35 PM  Result Value Ref Range   B Natriuretic Peptide 9.0 0.0 - 100.0 pg/mL  I-stat troponin, ED (not at [facility])     Status: None   Collection Time: 04/25/16  7:51 PM  Result Value Ref Range   Troponin i, poc 0.00 0.00 - 0.08 ng/mL   Comment 3          D-dimer, quantitative (not at [facility])     Status: None   Collection Time: 04/25/16  7:52 PM  Result Value Ref Range   D-Dimer, Quant 0.39 0.00 - 0.50 ug/mL-FEU  MRSA PCR Screening     Status: None   Collection Time: 04/25/16 11:46 PM  Result Value Ref Range   MRSA by PCR NEGATIVE NEGATIVE  Troponin I     Status: None   Collection Time: 04/26/16 12:00 AM  Result Value Ref Range   Troponin I <0.03 <0.03 ng/mL  Troponin I     Status: None   Collection Time: 04/26/16  5:05 AM  Result Value Ref Range   Troponin I <0.03 <0.03 ng/mL  CBC     Status: Abnormal   Collection Time: 04/26/16  5:05 AM    Result Value Ref Range   WBC 5.1 4.0 - 10.5 K/uL   RBC 4.37 3.87 - 5.11 MIL/uL   Hemoglobin 11.8 (L) 12.0 - 15.0 g/dL   HCT 37.9 36.0 - 46.0 %   MCV 86.7 78.0 - 100.0 fL   MCH 27.0 26.0 - 34.0 pg   MCHC 31.1 30.0 - 36.0 g/dL   RDW 14.5 11.5 - 15.5 %   Platelets 316 150 - 400 K/uL  Lipid panel     Status: Abnormal   Collection Time: 04/26/16  5:05 AM  Result Value Ref Range   Cholesterol 165 0 - 200 mg/dL   Triglycerides 181 (H) <150 mg/dL   HDL 41 >40 mg/dL   Total CHOL/HDL Ratio 4.0 RATIO   VLDL 36 0 - 40 mg/dL   LDL Cholesterol 88 0 - 99 mg/dL    Studies/Results: Dg Chest 2 View  Result Date: 04/25/2016 CLINICAL DATA:  Chest pressure beginning this morning. Neck pain radiating to upper back. Nausea, diaphoresis, shortness of breath and dizziness. EXAM: CHEST  2 VIEW COMPARISON:  05/09/2015 FINDINGS: Lungs are adequately inflated without focal consolidation or effusion. Cardiomediastinal silhouette is within normal. There is degenerative change of the spine. IMPRESSION: No active cardiopulmonary disease. Electronically Signed   By: Marin Olp M.D.   On: 04/25/2016 20:18    Medications:REviewed     @PROBHOSP @  1  CP  Atypical for cardiac  Pt has had 2 caths in past that have been normal    Was continuous for almost 24 hours  ENzymes negative   D Dimer is negative   Pt is VERY anxious  I would recomm echo to eval pulmonary pressures, RV function  Would not sched any further cardiac testing for now.   Empiric meds for GERD Note CRP and ERS have been neg in past   2  HTN  BP was up last week  Placed on losartan  WIll follow BP   LOS: 1 day   Dorris Carnes 04/26/2016, 9:30 AM

## 2016-04-26 NOTE — Progress Notes (Signed)
PROGRESS NOTE                                                                                                                                                                                                             Patient Demographics:    [name], is a 50 y.o. female, DOB - 1965/12/05, LFY:[id number]  Admit date - 04/25/2016   Admitting Physician Jani Gravel, MD  Outpatient Primary MD for the patient is Purvis Kilts, MD  LOS - 1  Chief Complaint  Patient presents with  . Chest Pain       Brief Narrative   50 year old female with known history of GERD, asthma, presents with chest pressure, transferred from [facility] to [facility] for cardiology evaluation   Subjective:    [name] today has, No headache, Complaining of chest pressure, and left arm pain, occasional dyspnea.    Assessment  & Plan :    Active Problems:   Essential hypertension   Chest pain   Hypokalemia   Hyperglycemia  Chest pain - Neurology input appreciated, appears to be atypical, to normal cath in the past, negative enzymes, negative D-dimer test, recommendation for 2-D echo to evaluate for pulmonary pressure and RV function.  Hypertension - Continue with  Losartan  Anxiety - Start on when necessary Xanax   Code Status : Full  Family Communication  : None at bedside  Disposition Plan  : Home  Consults  :  Cardiology  Procedures  : None  DVT Prophylaxis  :  Lovenox  Lab Results  Component Value Date   PLT 316 04/26/2016   Antibiotics  :    Anti-infectives    None        Objective:   Vitals:   04/26/16 0500 04/26/16 0753 04/26/16 0916 04/26/16 1233  BP: 130/85 122/86  120/82  Pulse: 79 63  67  Resp: 16 15  16   Temp: 97.7 F (36.5 C) 98 F (36.7 C)  97.6 F (36.4 C)  TempSrc:  Oral  Oral  SpO2: 100% 100% 97% 98%  Weight: 116.5 kg (256 lb 14.4 oz)     Height:        Wt Readings from Last 3 Encounters:  04/26/16  116.5 kg (256 lb 14.4 oz)  01/29/16 116.1 kg (256 lb)  07/30/15 114.8 kg (253  lb)     Intake/Output Summary (Last 24 hours) at 04/26/16 1330 Last data filed at 04/26/16 0500  Gross per 24 hour  Intake              123 ml  Output              400 ml  Net             -277 ml     Physical Exam  Awake Alert, Oriented X 3, No new F.N deficits, Normal affect [place].AT,PERRAL Supple Neck,No JVD, No cervical lymphadenopathy appriciated.  Symmetrical Chest wall movement, Good air movement bilaterally, CTAB RRR,No Gallops,Rubs or new Murmurs, No Parasternal Heave +ve B.Sounds, Abd Soft, No tenderness, No organomegaly appriciated, No rebound - guarding or rigidity. No Cyanosis, Clubbing or edema, No new Rash or bruise     Data Review:    CBC  Recent Labs Lab 04/25/16 1935 04/26/16 0505  WBC 6.1 5.1  HGB 12.6 11.8*  HCT 38.6 37.9  PLT 349 316  MCV 86.5 86.7  MCH 28.3 27.0  MCHC 32.6 31.1  RDW 14.3 14.5  LYMPHSABS 1.9  --   MONOABS 0.6  --   EOSABS 0.2  --   BASOSABS 0.0  --     Chemistries   Recent Labs Lab 04/25/16 1935  NA 139  K 3.3*  CL 106  CO2 29  GLUCOSE 102*  BUN 13  CREATININE 0.96  CALCIUM 9.4  AST 17  ALT 18  ALKPHOS 51  BILITOT 0.3   ------------------------------------------------------------------------------------------------------------------  Recent Labs  04/26/16 0505  CHOL 165  HDL 41  LDLCALC 88  TRIG 181*  CHOLHDL 4.0    No results found for: HGBA1C ------------------------------------------------------------------------------------------------------------------ No results for input(s): TSH, T4TOTAL, T3FREE, THYROIDAB in the last 72 hours.  Invalid input(s): FREET3 ------------------------------------------------------------------------------------------------------------------ No results for input(s): VITAMINB12, FOLATE, FERRITIN, TIBC, IRON, RETICCTPCT in the last 72 hours.  Coagulation profile  Recent Labs Lab  04/25/16 1935  INR 0.98     Recent Labs  04/25/16 1952  DDIMER 0.39    Cardiac Enzymes  Recent Labs Lab 04/26/16 0000 04/26/16 0505 04/26/16 0852  TROPONINI <0.03 <0.03 <0.03   ------------------------------------------------------------------------------------------------------------------    Component Value Date/Time   BNP 9.0 04/25/2016 1935    Inpatient Medications  Scheduled Meds: . aspirin EC  325 mg Oral Daily  . atorvastatin  80 mg Oral q1800  . enoxaparin (LOVENOX) injection  40 mg Subcutaneous Daily  . losartan  100 mg Oral Daily  . mometasone-formoterol  2 puff Inhalation BID  . [START ON 04/27/2016] pneumococcal 23 valent vaccine  0.5 mL Intramuscular Tomorrow-1000  . sodium chloride flush  3 mL Intravenous Q12H  . sodium chloride flush  3 mL Intravenous Q12H   Continuous Infusions:  PRN Meds:.sodium chloride, acetaminophen **OR** acetaminophen, albuterol, morphine injection, nitroGLYCERIN, ondansetron, sodium chloride flush  Micro Results Recent Results (from the past 240 hour(s))  MRSA PCR Screening     Status: None   Collection Time: 04/25/16 11:46 PM  Result Value Ref Range Status   MRSA by PCR NEGATIVE NEGATIVE Final    Comment:        The GeneXpert MRSA Assay (FDA approved for NASAL specimens only), is one component of a comprehensive MRSA colonization surveillance program. It is not intended to diagnose MRSA infection nor to guide or monitor treatment for MRSA infections.     Radiology Reports Dg Chest 2 View  Result Date: 04/25/2016 CLINICAL DATA:  Chest  pressure beginning this morning. Neck pain radiating to upper back. Nausea, diaphoresis, shortness of breath and dizziness. EXAM: CHEST  2 VIEW COMPARISON:  05/09/2015 FINDINGS: Lungs are adequately inflated without focal consolidation or effusion. Cardiomediastinal silhouette is within normal. There is degenerative change of the spine. IMPRESSION: No active cardiopulmonary disease.  Electronically Signed   By: Marin Olp M.D.   On: 04/25/2016 20:18     [facility], Britni Driscoll M.D on 04/26/2016 at 1:30 PM  Between 7am to 7pm - Pager - [phone]  After 7pm go to [email or web address] - password [facility]  [facility] -  Office  [phone]

## 2016-04-26 NOTE — Consult Note (Signed)
Cardiology Consult    Patient ID: [name] MRN: [id number], DOB/AGE: 1966/07/26  Admit date: 04/25/2016 Date of Consult: 04/26/2016 Primary Physician: Purvis Kilts, MD Primary Cardiologist:  Haroldine Laws, MD Requesting Provider:  Waldron Labs, MD  CC:  CP  History of Present Illness    50 yo F w a h/o chronic chest pain (normal prior cardiac catheterization x 2), HTN, OSA, GERD (last endoscopy 05/2015), & Crohn disease (baseline 3-6 BM per day) was transferred from [facility] where she presented with 2 days of CP that started while walking around in [place], [place].  The pain was described as a constant, left sided chest pressure associated with dyspnea, diaphoresis, lightheadedness, & nausea.  It was noted to worsen with exertion, coughing, sniffing, & lying down.  She attempted treatment with Zantac but found not relief. She clarified that this is different than her prior chest pain in that it is more of a constant chest pressure, compared to prior sharp pain that involved more of her left arm.    Notably, she felt poorly in a generalized fashion 5 days prior when she visited an outpatient provider & was found to be hypertensive to 161/111.  She was subsequently dypsneic & with slightly worse lower extremity swelling but didn't develop the CP until 3 days prior.  She denied orthopnea or paroxysmal nocturnal dyspnea.    At the OSH, there was concern regarding dynamic anterior T wave changes.  However, review of her prior ECG's revealed that these were relatively stable.  She was given SL NTG x 2, which she felt worsened her symptoms, & Morphine, which she believed helped.  She was also given a GI cocktail & ASA 324 mg.   Past Medical History   Past Medical History:  Diagnosis Date  . Chest pain    cath 2005 norm cors, repeat cath Feb 2011 normal cors and only minimally  elevated pulmonary pressures  . Crohn disease ([place])   . Diverticulosis   . GERD (gastroesophageal reflux disease)    . Hiatal hernia   . Obesity   . Palpitations   . Sleep apnea sleep study 11/03/2009   mild-no cpap    Past Surgical History:  Procedure Laterality Date  . ABDOMINAL HYSTERECTOMY    . ANKLE ARTHROSCOPY  06/01/2012   Procedure: ANKLE ARTHROSCOPY;  Surgeon: Colin Rhein, MD;  Location: [place];  Service: Orthopedics;  Laterality: Left;  left ankle arthorsocpy with extensive debridement and gastroc slide  . CARDIAC CATHETERIZATION  11/22/2009 and 2005   WNL  . CESAREAN SECTION    . CHOLECYSTECTOMY  09/08/2006   lap. chole.  . CHONDROPLASTY  06/17/2012   Procedure: CHONDROPLASTY;  Surgeon: Carole Civil, MD;  Location: [facility];  Service: Orthopedics;  Laterality: Right;  right patella  . ESOPHAGOGASTRODUODENOSCOPY N/A 05/14/2015   Procedure: ESOPHAGOGASTRODUODENOSCOPY (EGD);  Surgeon: Rogene Houston, MD;  Location: [facility];  Service: Endoscopy;  Laterality: N/A;  730  . INGUINAL HERNIA REPAIR  10/30/2008   right  . SHOULDER SURGERY     right x 2     Allergies  Allergies  Allergen Reactions  . Other Shortness Of Breath    Lemon grass  . Wasp Venom Anaphylaxis and Shortness Of Breath  . Hydromorphone Hcl Nausea And Vomiting  . Omnipaque [Iohexol] Hives and Nausea Only    Pt. Was premedicated with emergent protocol.  . Pollen Extract Swelling  . Adhesive [Tape] Rash   Inpatient Medications    .  aspirin EC  325 mg Oral Daily  . atorvastatin  80 mg Oral q1800  . enoxaparin (LOVENOX) injection  40 mg Subcutaneous Daily  . losartan  100 mg Oral Daily  . mometasone-formoterol  2 puff Inhalation BID  . [START ON 04/27/2016] pneumococcal 23 valent vaccine  0.5 mL Intramuscular Tomorrow-1000  . sodium chloride flush  3 mL Intravenous Q12H  . sodium chloride flush  3 mL Intravenous Q12H   Family History    Family History  Problem Relation Age of Onset  . Hypertension Mother   . Lupus Father   . COPD Father   . Congestive Heart Failure Maternal  Grandmother    Social History    Social History   Social History  . Marital status: Married    Spouse name: N/A  . Number of children: N/A  . Years of education: N/A   Occupational History  . Not on file.   Social History Main Topics  . Smoking status: Never Smoker  . Smokeless tobacco: Never Used  . Alcohol use No  . Drug use: No  . Sexual activity: Not on file   Other Topics Concern  . Not on file   Social History Narrative   She is a respiratory therapist at [employer].  She has 4 children age 10-22.       Review of Systems    General:  No chills, fever, night sweats or weight changes.  Cardiovascular:  No chest pain, dyspnea on exertion, edema, orthopnea, palpitations, paroxysmal nocturnal dyspnea. Dermatological: No rash, lesions/masses Respiratory: No cough, dyspnea Urologic: No hematuria, dysuria Abdominal:   No nausea, vomiting, diarrhea, bright red blood per rectum, melena, or hematemesis Neurologic:  No visual changes, wkns, changes in mental status. All other systems reviewed and are otherwise negative except as noted above.  Physical Exam    Blood pressure 122/86, pulse 63, temperature 98 F (36.7 C), temperature source Oral, resp. rate 15, height 6' (1.829 m), weight 116.5 kg (256 lb 14.4 oz), SpO2 97 %.  General: Pleasant, NAD Psych: Normal affect. Neuro: Alert and oriented X 3. Moves all extremities spontaneously. HEENT: Normal  Neck: Supple without bruits or JVD. Lungs:  Resp regular and unlabored, CTA. Heart: RRR no s3, s4, or murmurs. Abdomen: Soft, non-tender, non-distended, BS + x 4.  Extremities: No clubbing, cyanosis or edema. DP/PT/Radials 2+ and equal bilaterally.  Labs    Troponin [facility] Test)  Recent Labs  04/25/16 1951  TROPIPOC 0.00    Recent Labs  04/25/16 1935 04/26/16 0000 04/26/16 0505 04/26/16 0852  TROPONINI <0.03 <0.03 <0.03 <0.03   Lab Results  Component Value Date   WBC 5.1 04/26/2016    HGB 11.8 (L) 04/26/2016   HCT 37.9 04/26/2016   MCV 86.7 04/26/2016   PLT 316 04/26/2016    Recent Labs Lab 04/25/16 1935  NA 139  K 3.3*  CL 106  CO2 29  BUN 13  CREATININE 0.96  CALCIUM 9.4  PROT 7.3  BILITOT 0.3  ALKPHOS 51  ALT 18  AST 17  GLUCOSE 102*   Lab Results  Component Value Date   CHOL 165 04/26/2016   HDL 41 04/26/2016   LDLCALC 88 04/26/2016   TRIG 181 (H) 04/26/2016   Lab Results  Component Value Date   DDIMER 0.39 04/25/2016     Radiology Studies    Dg Chest 2 View  Result Date: 04/25/2016 CLINICAL DATA:  Chest pressure beginning this morning. Neck pain radiating  to upper back. Nausea, diaphoresis, shortness of breath and dizziness. EXAM: CHEST  2 VIEW COMPARISON:  05/09/2015 FINDINGS: Lungs are adequately inflated without focal consolidation or effusion. Cardiomediastinal silhouette is within normal. There is degenerative change of the spine. IMPRESSION: No active cardiopulmonary disease. Electronically Signed   By: Marin Olp M.D.   On: 04/25/2016 20:18   Assessment & Plan     50 yo F w a h/o chronic chest pain (normal prior cardiac catheterization x 2), HTN, OSA, GERD (last endoscopy 05/2015), & Crohn disease (baseline 3-6 BM per day) was transferred from [facility] p/w chest pain, EKG with changes intermittently similar to prior, & negative serial cardiac biomarkers.  - I attempted to provide the pt with reassurance that I have a low suspicion for cardiac etiology of her current discofort. - While final decision making is made regarding the need for ischemic evaluation, will plan for an echocardiogram. - We agree with her current Losartan 100 mg daily.   - We also agree with repletion of her potassium    Signed, Alfonso Ramus, MD 04/26/2016, 11:35 AM

## 2016-04-27 ENCOUNTER — Inpatient Hospital Stay (HOSPITAL_COMMUNITY): Payer: 59

## 2016-04-27 DIAGNOSIS — Z6834 Body mass index (BMI) 34.0-34.9, adult: Secondary | ICD-10-CM | POA: Diagnosis not present

## 2016-04-27 DIAGNOSIS — Z885 Allergy status to narcotic agent status: Secondary | ICD-10-CM | POA: Diagnosis not present

## 2016-04-27 DIAGNOSIS — Z91038 Other insect allergy status: Secondary | ICD-10-CM | POA: Diagnosis not present

## 2016-04-27 DIAGNOSIS — Z8719 Personal history of other diseases of the digestive system: Secondary | ICD-10-CM | POA: Diagnosis not present

## 2016-04-27 DIAGNOSIS — Z91048 Other nonmedicinal substance allergy status: Secondary | ICD-10-CM | POA: Diagnosis not present

## 2016-04-27 DIAGNOSIS — R079 Chest pain, unspecified: Secondary | ICD-10-CM

## 2016-04-27 DIAGNOSIS — I10 Essential (primary) hypertension: Secondary | ICD-10-CM | POA: Diagnosis not present

## 2016-04-27 DIAGNOSIS — Z91018 Allergy to other foods: Secondary | ICD-10-CM | POA: Diagnosis not present

## 2016-04-27 DIAGNOSIS — R0789 Other chest pain: Secondary | ICD-10-CM | POA: Diagnosis not present

## 2016-04-27 DIAGNOSIS — R9431 Abnormal electrocardiogram [ECG] [EKG]: Secondary | ICD-10-CM | POA: Diagnosis not present

## 2016-04-27 LAB — CBC
HCT: 38.5 % (ref 36.0–46.0)
Hemoglobin: 12 g/dL (ref 12.0–15.0)
MCH: 27.1 pg (ref 26.0–34.0)
MCHC: 31.2 g/dL (ref 30.0–36.0)
MCV: 87.1 fL (ref 78.0–100.0)
PLATELETS: 322 10*3/uL (ref 150–400)
RBC: 4.42 MIL/uL (ref 3.87–5.11)
RDW: 14.5 % (ref 11.5–15.5)
WBC: 4.8 10*3/uL (ref 4.0–10.5)

## 2016-04-27 LAB — BASIC METABOLIC PANEL
ANION GAP: 5 (ref 5–15)
BUN: 10 mg/dL (ref 6–20)
CALCIUM: 9.1 mg/dL (ref 8.9–10.3)
CO2: 31 mmol/L (ref 22–32)
Chloride: 104 mmol/L (ref 101–111)
Creatinine, Ser: 0.89 mg/dL (ref 0.44–1.00)
GFR calc Af Amer: >60 mL/min (ref >60)
GLUCOSE: 110 mg/dL — AB (ref 65–99)
Potassium: 3.9 mmol/L (ref 3.5–5.1)
SODIUM: 140 mmol/L (ref 135–145)

## 2016-04-27 LAB — ECHOCARDIOGRAM COMPLETE
HEIGHTINCHES: 72 in
WEIGHTICAEL: 4073.6 [oz_av]

## 2016-04-27 LAB — HEMOGLOBIN A1C
Hgb A1c MFr Bld: 5.8 % — ABNORMAL HIGH (ref 4.8–5.6)
MEAN PLASMA GLUCOSE: 120 mg/dL

## 2016-04-27 MED FILL — Fentanyl Citrate Preservative Free (PF) Inj 100 MCG/2ML: INTRAMUSCULAR | Qty: 2 | Status: AC

## 2016-04-27 MED FILL — Ondansetron HCl Inj 4 MG/2ML (2 MG/ML): INTRAMUSCULAR | Qty: 2 | Status: AC

## 2016-04-27 NOTE — Progress Notes (Signed)
  Echocardiogram 2D Echocardiogram has been performed.  Jennette Dubin 04/27/2016, 2:29 PM

## 2016-04-27 NOTE — Discharge Summary (Signed)
[name], is a 50 y.o. female  DOB 03-Jul-1966  MRN [id number].  Admission date:  04/25/2016  Admitting Physician  Jani Gravel, MD  Discharge Date:  04/27/2016   Primary MD  Purvis Kilts, MD  Recommendations for primary care physician for things to follow:  - Check CBC, BMP during next visit   Admission Diagnosis  Abnormal EKG [R94.31]   Discharge Diagnosis  Abnormal EKG [R94.31]   Active Problems:   Essential hypertension   Chest pain   Hypokalemia   Hyperglycemia      Past Medical History:  Diagnosis Date  . Chest pain    cath 2005 norm cors, repeat cath Feb 2011 normal cors and only minimally  elevated pulmonary pressures  . Crohn disease ([place])   . Diverticulosis   . GERD (gastroesophageal reflux disease)   . Hiatal hernia   . Obesity   . Palpitations   . Sleep apnea sleep study 11/03/2009   mild-no cpap    Past Surgical History:  Procedure Laterality Date  . ABDOMINAL HYSTERECTOMY    . ANKLE ARTHROSCOPY  06/01/2012   Procedure: ANKLE ARTHROSCOPY;  Surgeon: Colin Rhein, MD;  Location: [facility];  Service: Orthopedics;  Laterality: Left;  left ankle arthorsocpy with extensive debridement and gastroc slide  . CARDIAC CATHETERIZATION  11/22/2009 and 2005   WNL  . CESAREAN SECTION    . CHOLECYSTECTOMY  09/08/2006   lap. chole.  . CHONDROPLASTY  06/17/2012   Procedure: CHONDROPLASTY;  Surgeon: Carole Civil, MD;  Location: [facility];  Service: Orthopedics;  Laterality: Right;  right patella  . ESOPHAGOGASTRODUODENOSCOPY N/A 05/14/2015   Procedure: ESOPHAGOGASTRODUODENOSCOPY (EGD);  Surgeon: Rogene Houston, MD;  [facility];  Service: Endoscopy;  Laterality: N/A;  730  . INGUINAL HERNIA REPAIR  10/30/2008   right  . SHOULDER SURGERY     right x 2        History of present illness and  Hospital Course:     Kindly see H&P for history of  present illness and admission details, please review complete Labs, Consult reports and Test reports for all details in brief  HPI  from the history and physical done on the day of admission 04/25/2016  [name]  is a 50 y.o. female, Gerd/hiatal hernia, Asthma,  apparently started to have chest pressure yesterday.  Took zantac and tums without relief.  Slight lightheaded.  Pt came to work today and developed chest pressure at work which became worse throughtout the day.  + dyspnea, + fatigue.  + diaphoresis.  Pt is still having some light chest pressure even after slg nitro x2, and morphine.  Pt apparently had an ekg upstairs on the floor at [facility] which showed diffuse T wave inversion per ED ?Marland Kitchen    In ED ekg showed nsr at 77, nl axis, nl int, t inversion in v1, v2, v3.  Pt also noted to be hypokalemic. Pt will be admitted for w/up of chest pain.  Hospital Course  50 year old female with known history of GERD, asthma, presents with chest pressure, transferred [facility] to [facility] for cardiology evaluation  Chest pain - Cardiology input appreciated, appears to be atypical, two normal cath in the past, negative enzymes, negative D-dimer test, 2-D echo with EF 55%, no regional wall motion abnormalities, no further workup recommended by cardiology.  Hypertension - Continue with  Losartan  Left arm pain - Patient has chronic nonspecific complaints of left arm tingling, numbness, pain, reports she is following with rheumatology Dr.  Estanislado Pandy as outpatient, instructed her to continue following with her regarding further workup if indicated.    Discharge Condition: Stable   Follow UP  Follow-up Information    Purvis Kilts, MD .   Specialty:  Family Medicine Contact information: [address] [place] [address] [phone]             Discharge Instructions  and  Discharge Medications     Discharge Instructions    Diet - low sodium  heart healthy    Complete by:  As directed   Discharge instructions    Complete by:  As directed   Follow with Primary MD Purvis Kilts, MD in 7 days   Get CBC, CMP,  checked  by Primary MD next visit.    Activity: As tolerated with Full fall precautions use walker/cane & assistance as needed   Disposition Home    Diet: Heart Healthy  , with feeding assistance and aspiration precautions.  For Heart failure patients - Check your Weight same time everyday, if you gain over 2 pounds, or you develop in leg swelling, experience more shortness of breath or chest pain, call your Primary MD immediately. Follow Cardiac Low Salt Diet and 1.5 lit/day fluid restriction.   On your next visit with your primary care physician please Get Medicines reviewed and adjusted.   Please request your Prim.MD to go over all Hospital Tests and Procedure/Radiological results at the follow up, please get all Hospital records sent to your Prim MD by signing hospital release before you go home.   If you experience worsening of your admission symptoms, develop shortness of breath, life threatening emergency, suicidal or homicidal thoughts you must seek medical attention immediately by calling 911 or calling your MD immediately  if symptoms less severe.  You Must read complete instructions/literature along with all the possible adverse reactions/side effects for all the Medicines you take and that have been prescribed to you. Take any new Medicines after you have completely understood and accpet all the possible adverse reactions/side effects.   Do not drive, operating heavy machinery, perform activities at heights, swimming or participation in water activities or provide baby sitting services if your were admitted for syncope or siezures until you have seen by Primary MD or a Neurologist and advised to do so again.  Do not drive when taking Pain medications.    Do not take more than prescribed Pain, Sleep and  Anxiety Medications  Special Instructions: If you have smoked or chewed Tobacco  in the last 2 yrs please stop smoking, stop any regular Alcohol  and or any Recreational drug use.  Wear Seat belts while driving.   Please note  You were cared for by a hospitalist during your hospital stay. If you have any questions about your discharge medications or the care you received while you were in the hospital after you are discharged, you can call the unit and asked to speak  with the hospitalist on call if the hospitalist that took care of you is not available. Once you are discharged, your primary care physician will handle any further medical issues. Please note that NO REFILLS for any discharge medications will be authorized once you are discharged, as it is imperative that you return to your primary care physician (or establish a relationship with a primary care physician if you do not have one) for your aftercare needs so that they can reassess your need for medications and monitor your lab values.   Increase activity slowly    Complete by:  As directed       Medication List    TAKE these medications   albuterol (2.5 MG/3ML) 0.083% nebulizer solution Commonly known as:  PROVENTIL Take 2.5 mg by nebulization every 6 (six) hours as needed. For shortness of breath   cyclobenzaprine 10 MG tablet Commonly known as:  FLEXERIL Take 1 tablet (10 mg total) by mouth 3 (three) times daily as needed. For muscle spasms   dicyclomine 10 MG capsule Commonly known as:  BENTYL Take 1 capsule (10 mg total) by mouth 2 (two) times daily before a meal.   diphenhydrAMINE 25 MG tablet Commonly known as:  BENADRYL Take 1 tablet (25 mg total) by mouth every 6 (six) hours.   EPINEPHrine 0.3 mg/0.3 mL Soaj injection Commonly known as:  EPIPEN Inject 0.3 mLs (0.3 mg total) into the muscle once.   losartan 100 MG tablet Commonly known as:  COZAAR Take 100 mg by mouth daily.   ondansetron 4 MG  tablet Commonly known as:  ZOFRAN Take 1 tablet (4 mg total) by mouth every 8 (eight) hours as needed for nausea or vomiting.         Diet and Activity recommendation: See Discharge Instructions above   Consults obtained -  Cardiology   Major procedures and Radiology Reports - PLEASE review detailed and final reports for all details, in brief -      Dg Chest 2 View  Result Date: 04/25/2016 CLINICAL DATA:  Chest pressure beginning this morning. Neck pain radiating to upper back. Nausea, diaphoresis, shortness of breath and dizziness. EXAM: CHEST  2 VIEW COMPARISON:  05/09/2015 FINDINGS: Lungs are adequately inflated without focal consolidation or effusion. Cardiomediastinal silhouette is within normal. There is degenerative change of the spine. IMPRESSION: No active cardiopulmonary disease. Electronically Signed   By: Marin Olp M.D.   On: 04/25/2016 20:18    Micro Results     Recent Results (from the past 240 hour(s))  MRSA PCR Screening     Status: None   Collection Time: 04/25/16 11:46 PM  Result Value Ref Range Status   MRSA by PCR NEGATIVE NEGATIVE Final    Comment:        The GeneXpert MRSA Assay (FDA approved for NASAL specimens only), is one component of a comprehensive MRSA colonization surveillance program. It is not intended to diagnose MRSA infection nor to guide or monitor treatment for MRSA infections.        Today   Subjective:   [name] today has no headache,no chest or  abdominal pain,, feels much better wants to go home today.   Objective:   Blood pressure 129/76, pulse 74, temperature 98.4 F (36.9 C), temperature source Oral, resp. rate 18, height 6' (1.829 m), weight 115.5 kg (254 lb 9.6 oz), SpO2 100 %.   Intake/Output Summary (Last 24 hours) at 04/27/16 1435 Last data filed at 04/27/16 1400  Gross per  24 hour  Intake              480 ml  Output             1900 ml  Net            -1420 ml    Exam Awake Alert,  Oriented x 3, No new F.N deficits, Normal affect [place].AT,PERRAL Supple Neck,No JVD, No cervical lymphadenopathy appriciated.  Symmetrical Chest wall movement, Good air movement bilaterally, CTAB RRR,No Gallops,Rubs or new Murmurs, No Parasternal Heave +ve B.Sounds, Abd Soft, Non tender, No organomegaly appriciated, No rebound -guarding or rigidity. No Cyanosis, Clubbing or edema, No new Rash or bruise, good capillary refills in both hands, good pulses felt bilaterally in bilateral upper extremities,  Data Review   CBC w Diff: Lab Results  Component Value Date   WBC 4.8 04/27/2016   HGB 12.0 04/27/2016   HCT 38.5 04/27/2016   PLT 322 04/27/2016   LYMPHOPCT 31 04/25/2016   MONOPCT 10 04/25/2016   EOSPCT 3 04/25/2016   BASOPCT 1 04/25/2016    CMP: Lab Results  Component Value Date   NA 140 04/27/2016   K 3.9 04/27/2016   CL 104 04/27/2016   CO2 31 04/27/2016   BUN 10 04/27/2016   CREATININE 0.89 04/27/2016   CREATININE 0.84 07/17/2014   PROT 7.3 04/25/2016   ALBUMIN 4.0 04/25/2016   BILITOT 0.3 04/25/2016   ALKPHOS 51 04/25/2016   AST 17 04/25/2016   ALT 18 04/25/2016  .   Total Time in preparing paper work, data evaluation and todays exam - 35 minutes  Azriel Jakob M.D on 04/27/2016 at 2:35 PM  [facility]   Office  [phone]

## 2016-04-27 NOTE — Discharge Instructions (Signed)
Follow with Primary MD Purvis Kilts, MD in 7 days   Get CBC, CMP,  checked  by Primary MD next visit.    Activity: As tolerated with Full fall precautions use walker/cane & assistance as needed   Disposition Home    Diet: Heart Healthy  , with feeding assistance and aspiration precautions.  For Heart failure patients - Check your Weight same time everyday, if you gain over 2 pounds, or you develop in leg swelling, experience more shortness of breath or chest pain, call your Primary MD immediately. Follow Cardiac Low Salt Diet and 1.5 lit/day fluid restriction.   On your next visit with your primary care physician please Get Medicines reviewed and adjusted.   Please request your Prim.MD to go over all Hospital Tests and Procedure/Radiological results at the follow up, please get all Hospital records sent to your Prim MD by signing hospital release before you go home.   If you experience worsening of your admission symptoms, develop shortness of breath, life threatening emergency, suicidal or homicidal thoughts you must seek medical attention immediately by calling 911 or calling your MD immediately  if symptoms less severe.  You Must read complete instructions/literature along with all the possible adverse reactions/side effects for all the Medicines you take and that have been prescribed to you. Take any new Medicines after you have completely understood and accpet all the possible adverse reactions/side effects.   Do not drive, operating heavy machinery, perform activities at heights, swimming or participation in water activities or provide baby sitting services if your were admitted for syncope or siezures until you have seen by Primary MD or a Neurologist and advised to do so again.  Do not drive when taking Pain medications.    Do not take more than prescribed Pain, Sleep and Anxiety Medications  Special Instructions: If you have smoked or chewed Tobacco  in the last 2 yrs  please stop smoking, stop any regular Alcohol  and or any Recreational drug use.  Wear Seat belts while driving.   Please note  You were cared for by a hospitalist during your hospital stay. If you have any questions about your discharge medications or the care you received while you were in the hospital after you are discharged, you can call the unit and asked to speak with the hospitalist on call if the hospitalist that took care of you is not available. Once you are discharged, your primary care physician will handle any further medical issues. Please note that NO REFILLS for any discharge medications will be authorized once you are discharged, as it is imperative that you return to your primary care physician (or establish a relationship with a primary care physician if you do not have one) for your aftercare needs so that they can reassess your need for medications and monitor your lab values.

## 2016-04-27 NOTE — Progress Notes (Addendum)
Patient Name: [name] Date of Encounter: 04/27/2016   SUBJECTIVE  Still having intermittent chest pain, earlier had fluttering sensation while brushing teeth. Tingling sensation in L hand.   CURRENT MEDS . aspirin EC  325 mg Oral Daily  . atorvastatin  80 mg Oral q1800  . enoxaparin (LOVENOX) injection  40 mg Subcutaneous Daily  . losartan  100 mg Oral Daily  . mometasone-formoterol  2 puff Inhalation BID  . pneumococcal 23 valent vaccine  0.5 mL Intramuscular Tomorrow-1000  . sodium chloride flush  3 mL Intravenous Q12H    OBJECTIVE  Vitals:   04/26/16 1938 04/27/16 0009 04/27/16 0523 04/27/16 0807  BP:  119/80 130/76 129/76  Pulse: 78 82 74 74  Resp: 18 20 15 18   Temp:  98.2 F (36.8 C) 97.7 F (36.5 C) 98.4 F (36.9 C)  TempSrc:    Oral  SpO2: 98% 99% 99% 100%  Weight:   254 lb 9.6 oz (115.5 kg)   Height:        Intake/Output Summary (Last 24 hours) at 04/27/16 0904 Last data filed at 04/27/16 0807  Gross per 24 hour  Intake              960 ml  Output             1000 ml  Net              -40 ml   Filed Weights   04/25/16 2347 04/26/16 0500 04/27/16 0523  Weight: 256 lb 14.4 oz (116.5 kg) 256 lb 14.4 oz (116.5 kg) 254 lb 9.6 oz (115.5 kg)    PHYSICAL EXAM  General: Pleasant, NAD. Neuro: Alert and oriented X 3. Moves all extremities spontaneously. Psych: Normal affect. HEENT:  Normal  Neck: Supple without bruits or JVD. Lungs:  Resp regular and unlabored, CTA. Heart: RRR no s3, s4, or murmurs. Abdomen: Soft, non-tender, non-distended, BS + x 4.  Extremities: No clubbing, cyanosis or edema. DP/PT/Radials 2+ and equal bilaterally.  Accessory Clinical Findings  CBC  Recent Labs  04/25/16 1935 04/26/16 0505 04/27/16 0428  WBC 6.1 5.1 4.8  NEUTROABS 3.4  --   --   HGB 12.6 11.8* 12.0  HCT 38.6 37.9 38.5  MCV 86.5 86.7 87.1  PLT 349 316 300   Basic Metabolic Panel  Recent Labs  04/25/16 1935 04/27/16 0428  NA 139 140  K 3.3*  3.9  CL 106 104  CO2 29 31  GLUCOSE 102* 110*  BUN 13 10  CREATININE 0.96 0.89  CALCIUM 9.4 9.1   Liver Function Tests  Recent Labs  04/25/16 1935  AST 17  ALT 18  ALKPHOS 51  BILITOT 0.3  PROT 7.3  ALBUMIN 4.0    Recent Labs  04/25/16 1935  LIPASE 24   Cardiac Enzymes  Recent Labs  04/26/16 0000 04/26/16 0505 04/26/16 0852  TROPONINI <0.03 <0.03 <0.03   BNP Invalid input(s): POCBNP D-Dimer  Recent Labs  04/25/16 1952  DDIMER 0.39   Hemoglobin A1C  Recent Labs  04/26/16 0505  HGBA1C 5.8*   Fasting Lipid Panel  Recent Labs  04/26/16 0505  CHOL 165  HDL 41  LDLCALC 88  TRIG 181*  CHOLHDL 4.0   Thyroid Function Tests No results for input(s): TSH, T4TOTAL, T3FREE, THYROIDAB in the last 72 hours.  Invalid input(s): FREET3  TELE  Sinus rhythm at rate of 70s, intermittently goes to 90s  Radiology/Studies  Dg Chest 2 View  Result  Date: 04/25/2016 CLINICAL DATA:  Chest pressure beginning this morning. Neck pain radiating to upper back. Nausea, diaphoresis, shortness of breath and dizziness. EXAM: CHEST  2 VIEW COMPARISON:  05/09/2015 FINDINGS: Lungs are adequately inflated without focal consolidation or effusion. Cardiomediastinal silhouette is within normal. There is degenerative change of the spine. IMPRESSION: No active cardiopulmonary disease. Electronically Signed   By: Marin Olp M.D.   On: 04/25/2016 20:18    ASSESSMENT AND PLAN  1. Chest pain  -Atypical. Troponin x 3 negative. EKG without ischemic changes. Pt has had 2 caths in past that have been normal. D-dimer negative. Very anxious person. Pending echo to evaluate pulmonary PTN and RV function.   2. Fluttering sensation - HR mostly in 70s, seem goes to 90s with activity. No arrhthymias noted. Sinus rhythm. Continue to monitor on telemetry.   3. Essential hypertension - Stable and well controlled. Continue losartan.   4. Tingling sensation in L hand - ? Bluish  discoloration. Further management per primary.        Signed, Bhagat,Bhavinkumar PA-C Pager [phone] The patient has been seen in conjunction with Vin Bhagat, PAC. All aspects of care have been considered and discussed. The patient has been personally interviewed, examined, and all clinical data has been reviewed.   Assuming no significant echo abnormalities, no further cardiac w/u will be recommended.  Unfortunately, no one has ordered the echocardiogram which we are waiting to see.

## 2016-05-01 DIAGNOSIS — I10 Essential (primary) hypertension: Secondary | ICD-10-CM | POA: Diagnosis not present

## 2016-05-01 DIAGNOSIS — E876 Hypokalemia: Secondary | ICD-10-CM | POA: Diagnosis not present

## 2016-05-01 DIAGNOSIS — Z1389 Encounter for screening for other disorder: Secondary | ICD-10-CM | POA: Diagnosis not present

## 2016-05-01 DIAGNOSIS — Z6834 Body mass index (BMI) 34.0-34.9, adult: Secondary | ICD-10-CM | POA: Diagnosis not present

## 2016-05-01 DIAGNOSIS — R079 Chest pain, unspecified: Secondary | ICD-10-CM | POA: Diagnosis not present

## 2016-05-01 DIAGNOSIS — E6609 Other obesity due to excess calories: Secondary | ICD-10-CM | POA: Diagnosis not present

## 2016-05-01 DIAGNOSIS — K224 Dyskinesia of esophagus: Secondary | ICD-10-CM | POA: Diagnosis not present

## 2016-05-20 ENCOUNTER — Other Ambulatory Visit (HOSPITAL_COMMUNITY): Payer: Self-pay | Admitting: Internal Medicine

## 2016-05-20 DIAGNOSIS — Z6834 Body mass index (BMI) 34.0-34.9, adult: Secondary | ICD-10-CM | POA: Diagnosis not present

## 2016-05-20 DIAGNOSIS — E6609 Other obesity due to excess calories: Secondary | ICD-10-CM | POA: Diagnosis not present

## 2016-05-20 DIAGNOSIS — E049 Nontoxic goiter, unspecified: Secondary | ICD-10-CM

## 2016-05-20 DIAGNOSIS — G4730 Sleep apnea, unspecified: Secondary | ICD-10-CM | POA: Diagnosis not present

## 2016-05-20 DIAGNOSIS — M2550 Pain in unspecified joint: Secondary | ICD-10-CM | POA: Diagnosis not present

## 2016-05-20 DIAGNOSIS — R5383 Other fatigue: Secondary | ICD-10-CM | POA: Diagnosis not present

## 2016-05-20 DIAGNOSIS — Z1389 Encounter for screening for other disorder: Secondary | ICD-10-CM | POA: Diagnosis not present

## 2016-05-20 DIAGNOSIS — I10 Essential (primary) hypertension: Secondary | ICD-10-CM | POA: Diagnosis not present

## 2016-05-20 DIAGNOSIS — Z0001 Encounter for general adult medical examination with abnormal findings: Secondary | ICD-10-CM | POA: Diagnosis not present

## 2016-05-20 DIAGNOSIS — K5090 Crohn's disease, unspecified, without complications: Secondary | ICD-10-CM | POA: Diagnosis not present

## 2016-05-22 ENCOUNTER — Ambulatory Visit (HOSPITAL_COMMUNITY)
Admission: RE | Admit: 2016-05-22 | Discharge: 2016-05-22 | Disposition: A | Discharge status: Home / Self Care | Payer: 59 | Source: Ambulatory Visit | Attending: Internal Medicine | Admitting: Internal Medicine

## 2016-05-22 DIAGNOSIS — E049 Nontoxic goiter, unspecified: Secondary | ICD-10-CM

## 2016-05-28 ENCOUNTER — Ambulatory Visit (HOSPITAL_COMMUNITY)
Admission: RE | Admit: 2016-05-28 | Discharge: 2016-05-28 | Disposition: A | Discharge status: Home / Self Care | Payer: 59 | Source: Ambulatory Visit | Attending: Family Medicine | Admitting: Family Medicine

## 2016-05-28 DIAGNOSIS — Z1231 Encounter for screening mammogram for malignant neoplasm of breast: Secondary | ICD-10-CM | POA: Diagnosis not present

## 2016-06-10 DIAGNOSIS — G4733 Obstructive sleep apnea (adult) (pediatric): Secondary | ICD-10-CM | POA: Diagnosis not present

## 2016-06-12 DIAGNOSIS — G4733 Obstructive sleep apnea (adult) (pediatric): Secondary | ICD-10-CM | POA: Diagnosis not present

## 2016-06-23 DIAGNOSIS — G4733 Obstructive sleep apnea (adult) (pediatric): Secondary | ICD-10-CM | POA: Diagnosis not present

## 2016-07-01 ENCOUNTER — Ambulatory Visit: Payer: Self-pay | Admitting: Orthopedic Surgery

## 2016-07-08 ENCOUNTER — Ambulatory Visit (INDEPENDENT_AMBULATORY_CARE_PROVIDER_SITE_OTHER): Payer: 59 | Admitting: Orthopedic Surgery

## 2016-07-08 DIAGNOSIS — M1711 Unilateral primary osteoarthritis, right knee: Secondary | ICD-10-CM

## 2016-07-08 NOTE — Progress Notes (Signed)
Patient ID: [name], female   DOB: 10-22-1965, 50 y.o.   MRN: [id number]  Follow up bilateral knees  Increased pain right knee  HPI [name] is a 50 y.o. female.  Presents for evaluation of her right knee status post arthroscopy several years ago history of irritable bowel syndrome recently started on sulfasalazine comes in complaining of increased knee pain swelling may be related to increased demands on the knee at work  No catching locking or giving way pain is moderate to severe sensory worse with activity and weightbearing  Review of Systems Review of Systems  Constitutional: Negative for fever.  Neurological: Negative for numbness.     Past Medical History:  Diagnosis Date  . Chest pain    cath 2005 norm cors, repeat cath Feb 2011 normal cors and only minimally  elevated pulmonary pressures  . Crohn disease ([place])   . Diverticulosis   . GERD (gastroesophageal reflux disease)   . Hiatal hernia   . Obesity   . Palpitations   . Sleep apnea sleep study 11/03/2009   mild-no cpap    Past Surgical History:  Procedure Laterality Date  . ABDOMINAL HYSTERECTOMY    . ANKLE ARTHROSCOPY  06/01/2012   Procedure: ANKLE ARTHROSCOPY;  Surgeon: Colin Rhein, MD;  Location: [facility];  Service: Orthopedics;  Laterality: Left;  left ankle arthorsocpy with extensive debridement and gastroc slide  . CARDIAC CATHETERIZATION  11/22/2009 and 2005   WNL  . CESAREAN SECTION    . CHOLECYSTECTOMY  09/08/2006   lap. chole.  . CHONDROPLASTY  06/17/2012   Procedure: CHONDROPLASTY;  Surgeon: Carole Civil, MD;  Location: [facility];  Service: Orthopedics;  Laterality: Right;  right patella  . ESOPHAGOGASTRODUODENOSCOPY N/A 05/14/2015   Procedure: ESOPHAGOGASTRODUODENOSCOPY (EGD);  Surgeon: Rogene Houston, MD;  [facility];  Service: Endoscopy;  Laterality: N/A;  730  . INGUINAL HERNIA REPAIR  10/30/2008   right  . SHOULDER SURGERY     right x 2     Social  History Social History  Substance Use Topics  . Smoking status: Never Smoker  . Smokeless tobacco: Never Used  . Alcohol use No    Allergies  Allergen Reactions  . Other Shortness Of Breath    Lemon grass  . Wasp Venom Anaphylaxis and Shortness Of Breath  . Hydromorphone Hcl Nausea And Vomiting  . Omnipaque [Iohexol] Hives and Nausea Only    Pt. Was premedicated with emergent protocol.  . Pollen Extract Swelling  . Adhesive [Tape] Rash    No outpatient prescriptions have been marked as taking for the 07/08/16 encounter (Office Visit) with Carole Civil, MD.      Physical Exam Physical Exam Height was 510 weight was 250 blood pressure was 138/88 pulse was 74  Gen. appearance. The patient is well-developed and well-nourished, grooming and hygiene are normal. There are no gross congenital abnormalities  The patient is alert and oriented to person place and time  Mood and affect are normal  Ambulation normal heel-toe  Examination reveals the following: On inspection we find fullness in the right knee at the quadriceps compared to left which is normal  With the range of motion of  125 with hyperextension noted. Right knee.  Stability tests were normal    Strength tests revealed grade 5 motor strength  Skin we find no rash ulceration or erythema  Sensation remains intact  Impression vascular system shows no peripheral edema  Data Reviewed  none  Assessment    Progressive arthritis right knee previously treated with NSAIDs which had to be stopped secondary to irritable bowel disease, previous injection, previous surgery which showed arthritis in the patellofemoral region      Plan    Recommend orthovisc injection or similar product       Arther Abbott 07/08/2016, 10:56 AM

## 2016-07-20 ENCOUNTER — Telehealth: Payer: Self-pay | Admitting: Orthopedic Surgery

## 2016-07-20 NOTE — Telephone Encounter (Signed)
Patient asks that you give her a call regarding the meds for the Orthovisc injections

## 2016-07-21 NOTE — Telephone Encounter (Signed)
Left message for patient to return my call.

## 2016-07-22 NOTE — Telephone Encounter (Signed)
Patient returned call from nurse; please advise - cell ph# [phone]

## 2016-07-23 DIAGNOSIS — G4733 Obstructive sleep apnea (adult) (pediatric): Secondary | ICD-10-CM | POA: Diagnosis not present

## 2016-07-28 NOTE — Telephone Encounter (Signed)
Patient has been contacted by specialty pharmacy

## 2016-07-29 DIAGNOSIS — I10 Essential (primary) hypertension: Secondary | ICD-10-CM | POA: Diagnosis not present

## 2016-07-29 DIAGNOSIS — G4733 Obstructive sleep apnea (adult) (pediatric): Secondary | ICD-10-CM | POA: Diagnosis not present

## 2016-07-29 DIAGNOSIS — M1990 Unspecified osteoarthritis, unspecified site: Secondary | ICD-10-CM | POA: Diagnosis not present

## 2016-08-13 ENCOUNTER — Ambulatory Visit (HOSPITAL_COMMUNITY)
Admission: RE | Admit: 2016-08-13 | Discharge: 2016-08-13 | Disposition: A | Discharge status: Home / Self Care | Payer: 59 | Source: Ambulatory Visit | Attending: Internal Medicine | Admitting: Internal Medicine

## 2016-08-13 ENCOUNTER — Other Ambulatory Visit (HOSPITAL_COMMUNITY): Payer: Self-pay | Admitting: Internal Medicine

## 2016-08-13 DIAGNOSIS — M5136 Other intervertebral disc degeneration, lumbar region: Secondary | ICD-10-CM | POA: Diagnosis not present

## 2016-08-13 DIAGNOSIS — Z6834 Body mass index (BMI) 34.0-34.9, adult: Secondary | ICD-10-CM | POA: Diagnosis not present

## 2016-08-13 DIAGNOSIS — M5126 Other intervertebral disc displacement, lumbar region: Secondary | ICD-10-CM

## 2016-08-13 DIAGNOSIS — Z1389 Encounter for screening for other disorder: Secondary | ICD-10-CM | POA: Diagnosis not present

## 2016-08-13 DIAGNOSIS — M545 Low back pain: Secondary | ICD-10-CM | POA: Insufficient documentation

## 2016-08-13 DIAGNOSIS — K5180 Other ulcerative colitis without complications: Secondary | ICD-10-CM | POA: Diagnosis not present

## 2016-08-13 DIAGNOSIS — I10 Essential (primary) hypertension: Secondary | ICD-10-CM | POA: Diagnosis not present

## 2016-08-13 DIAGNOSIS — M1991 Primary osteoarthritis, unspecified site: Secondary | ICD-10-CM | POA: Diagnosis not present

## 2016-08-18 ENCOUNTER — Telehealth: Payer: Self-pay | Admitting: Rheumatology

## 2016-08-18 DIAGNOSIS — Z79899 Other long term (current) drug therapy: Secondary | ICD-10-CM

## 2016-08-18 NOTE — Telephone Encounter (Signed)
Spoke to patient, she states she has been out for a while but will go to [place] for the labs.

## 2016-08-18 NOTE — Telephone Encounter (Signed)
Labs are due before we can refill SSZ Last labs 74mago

## 2016-08-18 NOTE — Telephone Encounter (Signed)
Patient cannot get sulfasalazine filled at regular pharmacy, she is requesting it be sent to [facility].

## 2016-08-18 NOTE — Telephone Encounter (Signed)
Patient left rather urgent voice message checking on where we are in the process of Orthovisc. States has not heard anything lately.  Please call at cell# [phone]

## 2016-08-18 NOTE — Telephone Encounter (Signed)
Returned call, no answer, left vm 

## 2016-08-18 NOTE — Telephone Encounter (Signed)
[name] spoke with patient

## 2016-08-23 DIAGNOSIS — G4733 Obstructive sleep apnea (adult) (pediatric): Secondary | ICD-10-CM | POA: Diagnosis not present

## 2016-09-01 DIAGNOSIS — Z1389 Encounter for screening for other disorder: Secondary | ICD-10-CM | POA: Diagnosis not present

## 2016-09-01 DIAGNOSIS — E6609 Other obesity due to excess calories: Secondary | ICD-10-CM | POA: Diagnosis not present

## 2016-09-01 DIAGNOSIS — Z6833 Body mass index (BMI) 33.0-33.9, adult: Secondary | ICD-10-CM | POA: Diagnosis not present

## 2016-09-01 DIAGNOSIS — R42 Dizziness and giddiness: Secondary | ICD-10-CM | POA: Diagnosis not present

## 2016-09-01 DIAGNOSIS — R531 Weakness: Secondary | ICD-10-CM | POA: Diagnosis not present

## 2016-09-03 ENCOUNTER — Telehealth: Payer: Self-pay | Admitting: Rheumatology

## 2016-09-03 NOTE — Telephone Encounter (Signed)
-----   Message from Candice Camp, RT sent at 08/18/2016  2:05 PM EST ----- Patient needs follow up appt with Dr Estanislado Pandy pls call to make appt. Was due in October

## 2016-09-03 NOTE — Telephone Encounter (Signed)
LMOM last week for patient to call back to schedule appt.

## 2016-09-07 ENCOUNTER — Encounter: Payer: Self-pay | Admitting: Neurology

## 2016-09-07 ENCOUNTER — Ambulatory Visit (INDEPENDENT_AMBULATORY_CARE_PROVIDER_SITE_OTHER): Payer: 59 | Admitting: Neurology

## 2016-09-07 VITALS — BP 147/102 | HR 78 | Ht 71.5 in | Wt 248.8 lb

## 2016-09-07 DIAGNOSIS — G43009 Migraine without aura, not intractable, without status migrainosus: Secondary | ICD-10-CM | POA: Diagnosis not present

## 2016-09-07 DIAGNOSIS — R531 Weakness: Secondary | ICD-10-CM

## 2016-09-07 DIAGNOSIS — I6389 Other cerebral infarction: Secondary | ICD-10-CM

## 2016-09-07 DIAGNOSIS — R202 Paresthesia of skin: Secondary | ICD-10-CM | POA: Diagnosis not present

## 2016-09-07 DIAGNOSIS — R42 Dizziness and giddiness: Secondary | ICD-10-CM

## 2016-09-07 DIAGNOSIS — I638 Other cerebral infarction: Secondary | ICD-10-CM | POA: Diagnosis not present

## 2016-09-07 NOTE — Patient Instructions (Addendum)
Remember to drink plenty of fluid, eat healthy meals and do not skip any meals. Try to eat protein with a every meal and eat a healthy snack such as fruit or nuts in between meals. Try to keep a regular sleep-wake schedule and try to exercise daily, particularly in the form of walking, 20-30 minutes a day, if you can.   As far as your medications are concerned, I would like to suggest: After workup may suggest Cymbalta which   As far as diagnostic testing: MRI brain, MRA head and carotid dopplers, Labs  Our phone number is [phone]. We also have an after hours call service for urgent matters and there is a physician on-call for urgent questions. For any emergencies you know to call 911 or go to the nearest emergency room  To prevent or relieve headaches, try the following: Cool Compress. Lie down and place a cool compress on your head.  Avoid headache triggers. If certain foods or odors seem to have triggered your migraines in the past, avoid them. A headache diary might help you identify triggers.  Include physical activity in your daily routine. Try a daily walk or other moderate aerobic exercise.  Manage stress. Find healthy ways to cope with the stressors, such as delegating tasks on your to-do list.  Practice relaxation techniques. Try deep breathing, yoga, massage and visualization.  Eat regularly. Eating regularly scheduled meals and maintaining a healthy diet might help prevent headaches. Also, drink plenty of fluids.  Follow a regular sleep schedule. Sleep deprivation might contribute to headaches Consider biofeedback. With this mind-body technique, you learn to control certain bodily functions - such as muscle tension, heart rate and blood pressure - to prevent headaches or reduce headache pain.    Proceed to emergency room if you experience new or worsening symptoms or symptoms do not resolve, if you have new neurologic symptoms or if headache is severe, or for any concerning  symptom.

## 2016-09-07 NOTE — Progress Notes (Signed)
[facility]    Provider:  Dr Jaynee Eagles Referring Provider: Sharilyn Sites, MD Primary Care Physician:  Purvis Kilts, MD  CC:  Diziness  HPI:  [name] is a 50 y.o. female here as a referral from Dr. Hilma Favors for dizziness and weakness. Past medical history hypertension, Crohn's disease, diverticulosis, asthma, sleep apnea, obesity, migraine. Patient has had dizziness and weakness for years. She has had neurologic evaluation in the past. In 2008 saw Dr. Gaynell Face. In July she started having HTN, she was diagnosed with sever sleep apnea and now using a cpap(she has not used it in 3 weeks), she has bad joint pain and osteoarthritis is bad and she is seeing Dr. Patrecia Pour, she has had body cramps. She couldn't move legs because of stiffness and pain and rigidity. Thanksgiving she got very weak like she was going to collapse and she was "going left", she had weakness and numbness and tingling on the left arm and leg and also a headache. Her headaches are oressure on the right side of the head, she was diagnosed with migraines in the past and she currently has headaches it feels like pressure on the right side of the head and over the eyes, in the back of the head, pounding throbbing, light bothers her, she feels like she is in a cloud, she gets nauseated as well. She has associated dizziness. She was having word-finding difficulty, the left-sided weakness all happened with the headache. Also dizziness. Headaches can last hours or days. She has headaches 10 days out of the month. No vomiting. Can be a 6/10 in pain. She has a "high tolerance for pain" and it can debilitate her. She has paresthesias of her skin. Years ago she also had a similar episode of not being able to move her left side as well. They took her to [facility].   no recent head injury or inciting event, no recent neck hyperextension injury, no exposure to ototoxic meds. Denies nausea, ringing in the ears, hearing  loss, ear fullness, she has a mild headache and unsteady gait with postural instability. Denies diplopia, dysarthria, dysphasia or weakness.  Reviewed notes, labs and imaging from outside physicians, which showed: Reviewed notes from primary care. Patient is on meclizine, Percocet, mobic, Flexeril and blood pressure medications. She presented with 1-2 weeks of vertigo. Room spinning, duration and minutes, recurrent, triggered by rolling in bed, no recent head injury or inciting event, no recent neck hyperextension injury, no exposure to ototoxic meds. Denies nausea, ringing in the ears, hearing loss, ear fullness, she has a mild headache and unsteady gait with postural instability. Denies diplopia, dysarthria, dysphasia or weakness.  Labs drawn 05/20/2016 were largely unremarkable including CBC with normal white blood cells, normal hemoglobin and hematocrit, CMP with BUN 11 and creatinine 0.77, ESR was 12, TSH normal at 0.634, vitamin B12 303.  MRI of the lumbar spine 08/2016 (personally reviewed images and agree with the following)  Vertebrae: Endplate reactive changes at L5-S1 and partial interbody fusion. No worrisome bone lesions or fractures. Scattered hemangiomas are noted.  Conus medullaris: Extends to the fell 1 level and appears normal.  Paraspinal and other soft tissues: No significant findings  Disc levels:  No significant findings.  No significant findings.  Mild annular bulge with slight flattening of the ventral thecal sac and mild bilateral lateral recess encroachment. No significant spinal or foraminal stenosis.  L4-5: Mild to moderate facet disease but no focal disc protrusions, spinal or foraminal stenosis.  L5-S1:  Disc disease and facet disease. Partial interbody fusion. The spinal canal is generous. No spinal or foraminal stenosis.  IMPRESSION: Mild multilevel disc disease and facet disease but no significant disc protrusions, spinal or foraminal  stenosis.   Review of Systems: Patient complains of symptoms per HPI as well as the following symptoms: Blurred vision, eye pain, memory loss, headache, numbness, weakness, dizziness, tremor, joint pain, joint swelling, cramps, aching muscles, diarrhea, constipation. Pertinent negatives per HPI. All others negative.   Social History   Social History  . Marital status: Married    Spouse name: N/A  . Number of children: 4  . Years of education: N/A   Occupational History  . [employer]    Social History Main Topics  . Smoking status: Never Smoker  . Smokeless tobacco: Never Used  . Alcohol use No  . Drug use: No  . Sexual activity: Not on file   Other Topics Concern  . Not on file   Social History Narrative   Lives with husband and children   Caffeine use: 102 cups coffee/day   She is a respiratory therapist at [employer].     She has 4 children age 32-22.      Family History  Problem Relation Age of Onset  . Hypertension Mother   . Lupus Father   . COPD Father   . Congestive Heart Failure Maternal Grandmother     Past Medical History:  Diagnosis Date  . Chest pain    cath 2005 norm cors, repeat cath Feb 2011 normal cors and only minimally  elevated pulmonary pressures  . Crohn disease ([place])   . Diverticulosis   . GERD (gastroesophageal reflux disease)   . Hiatal hernia   . Obesity   . Palpitations   . Sleep apnea sleep study 11/03/2009   mild-no cpap    Past Surgical History:  Procedure Laterality Date  . ABDOMINAL HYSTERECTOMY    . ANKLE ARTHROSCOPY  06/01/2012   Procedure: ANKLE ARTHROSCOPY;  Surgeon: Colin Rhein, MD;  Location: [facility];  Service: Orthopedics;  Laterality: Left;  left ankle arthorsocpy with extensive debridement and gastroc slide  . CARDIAC CATHETERIZATION  11/22/2009 and 2005   WNL  . CESAREAN SECTION    . CHOLECYSTECTOMY  09/08/2006   lap. chole.  . CHONDROPLASTY  06/17/2012   Procedure: CHONDROPLASTY;  Surgeon:  Carole Civil, MD;  Location: [facility];  Service: Orthopedics;  Laterality: Right;  right patella  . ESOPHAGOGASTRODUODENOSCOPY N/A 05/14/2015   Procedure: ESOPHAGOGASTRODUODENOSCOPY (EGD);  Surgeon: Rogene Houston, MD;  [facility];  Service: Endoscopy;  Laterality: N/A;  730  . INGUINAL HERNIA REPAIR  10/30/2008   right  . SHOULDER SURGERY     right x 2     Current Outpatient Prescriptions  Medication Sig Dispense Refill  . albuterol (PROVENTIL) (2.5 MG/3ML) 0.083% nebulizer solution Take 2.5 mg by nebulization every 6 (six) hours as needed. For shortness of breath    . cyclobenzaprine (FLEXERIL) 10 MG tablet Take 1 tablet (10 mg total) by mouth 3 (three) times daily as needed. For muscle spasms 40 tablet 2  . dicyclomine (BENTYL) 10 MG capsule Take 1 capsule (10 mg total) by mouth 2 (two) times daily before a meal. 60 capsule 3  . diphenhydrAMINE (BENADRYL) 25 MG tablet Take 1 tablet (25 mg total) by mouth every 6 (six) hours. 20 tablet 0  . EPINEPHrine (EPIPEN) 0.3 mg/0.3 mL SOAJ injection Inject 0.3 mLs (0.3  mg total) into the muscle once. 1 Device 0  . losartan (COZAAR) 100 MG tablet Take 100 mg by mouth daily.    . meclizine (ANTIVERT) 25 MG tablet Take 25 mg by mouth 3 (three) times daily as needed for dizziness.    . ondansetron (ZOFRAN) 4 MG tablet Take 1 tablet (4 mg total) by mouth every 8 (eight) hours as needed for nausea or vomiting. 30 tablet 1  . Pantoprazole Sodium (PROTONIX PO) Take 1 tablet by mouth as needed.    . TURMERIC PO Take 2 capsules by mouth daily.     No current facility-administered medications for this visit.     Allergies as of 09/07/2016 - Review Complete 09/07/2016  Allergen Reaction Noted  . Other Shortness Of Breath 05/28/2012  . Wasp venom Anaphylaxis and Shortness Of Breath 08/06/2011  . Hydromorphone hcl Nausea And Vomiting 08/13/2009  . Omnipaque [iohexol] Hives and Nausea Only 01/02/2011  . Pollen extract Swelling 05/28/2012  .  Adhesive [tape] Rash 06/03/2012    Vitals: BP (!) 147/102 (BP Location: Left Arm, Patient Position: Sitting, Cuff Size: Large)   Pulse 78   Ht 5' 11.5" (1.816 m)   Wt 248 lb 12.8 oz (112.9 kg)   BMI 34.22 kg/m  Last Weight:  Wt Readings from Last 1 Encounters:  09/07/16 248 lb 12.8 oz (112.9 kg)   Last Height:   Ht Readings from Last 1 Encounters:  09/07/16 5' 11.5" (1.816 m)    Physical exam: Exam: Gen: NAD, conversant, well nourised, obese, well groomed                     CV: RRR, no MRG. No Carotid Bruits. No peripheral edema, warm, nontender Eyes: Conjunctivae clear without exudates or hemorrhage  Neuro: Detailed Neurologic Exam  Speech:    Speech is normal; fluent and spontaneous with normal comprehension.  Cognition:    The patient is oriented to person, place, and time;     recent and remote memory intact;     language fluent;     normal attention, concentration,     fund of knowledge Cranial Nerves:    The pupils are equal, round, and reactive to light. The fundi are normal and spontaneous venous pulsations are present. Visual fields are full to finger confrontation. Extraocular movements are intact. Trigeminal sensation is intact and the muscles of mastication are normal. The face is symmetric. The palate elevates in the midline. Hearing intact. Voice is normal. Shoulder shrug is normal. The tongue has normal motion without fasciculations.   Coordination:    Normal finger to nose and heel to shin. Normal rapid alternating movements.   Gait:    Heel-toe and tandem gait are normal.   Motor Observation:    No asymmetry, no atrophy, and no involuntary movements noted. Tone:    Normal muscle tone.    Posture:    Posture is normal. normal erect    Strength: Mild proximal left-sided weakness. Strength is V/V in the upper and lower limbs.      Sensation: intact to LT     Reflex Exam:  DTR's:    Deep tendon reflexes in the upper and lower extremities are  normal bilaterally.   Toes:    The toes are downgoing bilaterally.   Clonus:    Clonus is absent.      Assessment/Plan: 50 year old with episodic left-sided weakness and paresthesias, dizziness, headaches  - OSA: discussed compliance with cpap, she has not used it  in 3 weeks - Left sided weakness and paresthesias. Mri brain w/wo contrast, mra head. May be complicated migraines. Need to rule out stroke, MS and other intracranial etiologies. MRA neck for carotid stenosis and r/o dissection - Migraine: suggest Cymbalta which may help with migraines as well as her joint pain.She wants to hold off until after workup.   Discussed: To prevent or relieve headaches, try the following: Cool Compress. Lie down and place a cool compress on your head.  Avoid headache triggers. If certain foods or odors seem to have triggered your migraines in the past, avoid them. A headache diary might help you identify triggers.  Include physical activity in your daily routine. Try a daily walk or other moderate aerobic exercise.  Manage stress. Find healthy ways to cope with the stressors, such as delegating tasks on your to-do list.  Practice relaxation techniques. Try deep breathing, yoga, massage and visualization.  Eat regularly. Eating regularly scheduled meals and maintaining a healthy diet might help prevent headaches. Also, drink plenty of fluids.  Follow a regular sleep schedule. Sleep deprivation might contribute to headaches Consider biofeedback. With this mind-body technique, you learn to control certain bodily functions - such as muscle tension, heart rate and blood pressure - to prevent headaches or reduce headache pain.    Proceed to emergency room if you experience new or worsening symptoms or symptoms do not resolve, if you have new neurologic symptoms or if headache is severe, or for any concerning symptom.   CC: [name] Sites MD Sarina Ill, MD  [facility] [address] [place], [place] [address]  Phone [phone] Fax [phone]

## 2016-09-08 ENCOUNTER — Telehealth: Payer: Self-pay | Admitting: *Deleted

## 2016-09-08 LAB — BASIC METABOLIC PANEL
BUN / CREAT RATIO: 15 (ref 9–23)
BUN: 13 mg/dL (ref 6–24)
CALCIUM: 9 mg/dL (ref 8.7–10.2)
CHLORIDE: 104 mmol/L (ref 96–106)
CO2: 25 mmol/L (ref 18–29)
Creatinine, Ser: 0.84 mg/dL (ref 0.57–1.00)
GFR, EST AFRICAN AMERICAN: 95 mL/min/{1.73_m2} (ref >59)
GFR, EST NON AFRICAN AMERICAN: 82 mL/min/{1.73_m2} (ref >59)
Glucose: 101 mg/dL — ABNORMAL HIGH (ref 65–99)
POTASSIUM: 4.5 mmol/L (ref 3.5–5.2)
Sodium: 144 mmol/L (ref 134–144)

## 2016-09-08 NOTE — Telephone Encounter (Signed)
Called and spoke to pt about normal labs per AA,MD. She verbalized understanding.

## 2016-09-08 NOTE — Telephone Encounter (Signed)
-----   Message from Melvenia Beam, MD sent at 09/08/2016 12:26 PM EST ----- Labs normal

## 2016-09-10 ENCOUNTER — Telehealth: Payer: Self-pay

## 2016-09-10 NOTE — Telephone Encounter (Signed)
Pt called back to verify tests that she saw ordered in [place]. Says that she thought that Dr. Jaynee Eagles talked about doing a carotid US instead of an MRI of the neck. Let her know that an MRI and MRA of the head and an MRA of the neck were ordered. Explained that an MRA is a magnetic resonance angiogram that can help in defining the anatomy of blood vessels so that Dr. Jaynee Eagles can check for stenosis/dissection. Pt verbalized understanding and appreciation for call. Would like a call back if a different test is recommended.

## 2016-09-10 NOTE — Telephone Encounter (Signed)
Spoke with AA,MD and this is correct. No other test needed at this time.

## 2016-09-11 ENCOUNTER — Ambulatory Visit (HOSPITAL_COMMUNITY)
Admission: RE | Admit: 2016-09-11 | Discharge: 2016-09-11 | Disposition: A | Discharge status: Home / Self Care | Payer: 59 | Source: Ambulatory Visit | Attending: Neurology | Admitting: Neurology

## 2016-09-11 ENCOUNTER — Encounter (HOSPITAL_COMMUNITY)
Admission: RE | Admit: 2016-09-11 | Discharge: 2016-09-11 | Disposition: A | Discharge status: Home / Self Care | Payer: 59 | Source: Ambulatory Visit | Attending: Neurology | Admitting: Neurology

## 2016-09-11 DIAGNOSIS — I6389 Other cerebral infarction: Secondary | ICD-10-CM

## 2016-09-11 DIAGNOSIS — G43009 Migraine without aura, not intractable, without status migrainosus: Secondary | ICD-10-CM

## 2016-09-11 DIAGNOSIS — R42 Dizziness and giddiness: Secondary | ICD-10-CM | POA: Insufficient documentation

## 2016-09-11 DIAGNOSIS — R202 Paresthesia of skin: Secondary | ICD-10-CM

## 2016-09-11 DIAGNOSIS — R531 Weakness: Secondary | ICD-10-CM

## 2016-09-11 DIAGNOSIS — R200 Anesthesia of skin: Secondary | ICD-10-CM | POA: Diagnosis not present

## 2016-09-11 DIAGNOSIS — R51 Headache: Secondary | ICD-10-CM | POA: Diagnosis not present

## 2016-09-11 MED ORDER — GADOBENATE DIMEGLUMINE 529 MG/ML IV SOLN
20.0000 mL | Freq: Once | INTRAVENOUS | Status: AC | PRN
  Administered 2016-09-11: 20 mL via INTRAVENOUS

## 2016-09-14 ENCOUNTER — Telehealth: Payer: Self-pay | Admitting: *Deleted

## 2016-09-14 NOTE — Telephone Encounter (Signed)
Called and spoke to pt about normal imaging per AA,MD. Pt verbalized understanding.

## 2016-09-14 NOTE — Telephone Encounter (Signed)
-----   Message from Melvenia Beam, MD sent at 09/13/2016  7:26 PM EST ----- Imaging normal

## 2016-09-15 ENCOUNTER — Other Ambulatory Visit (HOSPITAL_COMMUNITY): Payer: Self-pay

## 2016-09-15 ENCOUNTER — Ambulatory Visit (HOSPITAL_COMMUNITY): Payer: 59

## 2016-09-15 ENCOUNTER — Inpatient Hospital Stay (HOSPITAL_COMMUNITY): Admission: RE | Admit: 2016-09-15 | Payer: Self-pay | Source: Ambulatory Visit

## 2016-09-15 DIAGNOSIS — Z6834 Body mass index (BMI) 34.0-34.9, adult: Secondary | ICD-10-CM | POA: Diagnosis not present

## 2016-09-15 DIAGNOSIS — I10 Essential (primary) hypertension: Secondary | ICD-10-CM | POA: Diagnosis not present

## 2016-09-15 DIAGNOSIS — M62838 Other muscle spasm: Secondary | ICD-10-CM | POA: Diagnosis not present

## 2016-09-22 DIAGNOSIS — G4733 Obstructive sleep apnea (adult) (pediatric): Secondary | ICD-10-CM | POA: Diagnosis not present

## 2016-10-20 DIAGNOSIS — S8392XA Sprain of unspecified site of left knee, initial encounter: Secondary | ICD-10-CM | POA: Diagnosis not present

## 2016-10-20 DIAGNOSIS — Z1389 Encounter for screening for other disorder: Secondary | ICD-10-CM | POA: Diagnosis not present

## 2016-10-20 DIAGNOSIS — M25562 Pain in left knee: Secondary | ICD-10-CM | POA: Diagnosis not present

## 2016-10-20 DIAGNOSIS — Z6833 Body mass index (BMI) 33.0-33.9, adult: Secondary | ICD-10-CM | POA: Diagnosis not present

## 2016-10-27 ENCOUNTER — Encounter: Payer: Self-pay | Admitting: Orthopedic Surgery

## 2016-10-27 ENCOUNTER — Ambulatory Visit (INDEPENDENT_AMBULATORY_CARE_PROVIDER_SITE_OTHER): Payer: 59

## 2016-10-27 ENCOUNTER — Ambulatory Visit (INDEPENDENT_AMBULATORY_CARE_PROVIDER_SITE_OTHER): Payer: 59 | Admitting: Orthopedic Surgery

## 2016-10-27 VITALS — BP 122/87 | HR 76

## 2016-10-27 DIAGNOSIS — G8929 Other chronic pain: Secondary | ICD-10-CM

## 2016-10-27 DIAGNOSIS — M1712 Unilateral primary osteoarthritis, left knee: Secondary | ICD-10-CM

## 2016-10-27 DIAGNOSIS — M25562 Pain in left knee: Secondary | ICD-10-CM

## 2016-10-27 DIAGNOSIS — S83242A Other tear of medial meniscus, current injury, left knee, initial encounter: Secondary | ICD-10-CM | POA: Diagnosis not present

## 2016-10-27 MED ORDER — TRAMADOL-ACETAMINOPHEN 37.5-325 MG PO TABS: 1.0000 | ORAL_TABLET | Freq: Four times a day (QID) | ORAL | 2 refills | Status: DC | PRN

## 2016-10-27 NOTE — Addendum Note (Signed)
Addended by: Baldomero Lamy B on: 10/27/2016 11:10 AM   Modules accepted: Orders

## 2016-10-27 NOTE — Progress Notes (Addendum)
Patient ID: [name], female   DOB: 1965/12/05, 51 y.o.   MRN: [id number]   Chief Complaint  Patient presents with  . Knee Pain    LEFT KNEE PAIN    [name] is a 51 y.o. female.   HPI 51 year-old female history of chronic bilateral knee pain status post arthroscopy right knee presents with acute onset of pain in the left knee acute pop loss of motion swelling  She has difficulty weightbearing so she went to the primary care doctor who gave her shot of cortisone did not really help  Has some catching when the knee goes into extension  Review of Systems  Constitutional: Negative for chills, fever and weight loss.  Respiratory: Negative for shortness of breath.   Cardiovascular: Negative for chest pain.  Neurological: Negative for tingling.    Review of Systems See hpi  Past Medical History:  Diagnosis Date  . Chest pain    cath 2005 norm cors, repeat cath Feb 2011 normal cors and only minimally  elevated pulmonary pressures  . Crohn disease ([place])   . Diverticulosis   . GERD (gastroesophageal reflux disease)   . Hiatal hernia   . Obesity   . Palpitations   . Sleep apnea sleep study 11/03/2009   mild-no cpap    Past Surgical History:  Procedure Laterality Date  . ABDOMINAL HYSTERECTOMY    . ANKLE ARTHROSCOPY  06/01/2012   Procedure: ANKLE ARTHROSCOPY;  Surgeon: Colin Rhein, MD;  Location: [facility];  Service: Orthopedics;  Laterality: Left;  left ankle arthorsocpy with extensive debridement and gastroc slide  . CARDIAC CATHETERIZATION  11/22/2009 and 2005   WNL  . CESAREAN SECTION    . CHOLECYSTECTOMY  09/08/2006   lap. chole.  . CHONDROPLASTY  06/17/2012   Procedure: CHONDROPLASTY;  Surgeon: Carole Civil, MD;  [facility];  Service: Orthopedics;  Laterality: Right;  right patella  . ESOPHAGOGASTRODUODENOSCOPY N/A 05/14/2015   Procedure: ESOPHAGOGASTRODUODENOSCOPY (EGD);  Surgeon: Rogene Houston, MD;  [facility];   Service: Endoscopy;  Laterality: N/A;  730  . INGUINAL HERNIA REPAIR  10/30/2008   right  . SHOULDER SURGERY     right x 2     Allergies  Allergen Reactions  . Other Shortness Of Breath    Lemon grass  . Wasp Venom Anaphylaxis and Shortness Of Breath  . Hydromorphone Hcl Nausea And Vomiting  . Omnipaque [Iohexol] Hives and Nausea Only    Pt. Was premedicated with emergent protocol.  . Pollen Extract Swelling  . Adhesive [Tape] Rash    Current Outpatient Prescriptions  Medication Sig Dispense Refill  . albuterol (PROVENTIL) (2.5 MG/3ML) 0.083% nebulizer solution Take 2.5 mg by nebulization every 6 (six) hours as needed. For shortness of breath    . AMLODIPINE BESYLATE PO Take by mouth.    . cyclobenzaprine (FLEXERIL) 10 MG tablet Take 1 tablet (10 mg total) by mouth 3 (three) times daily as needed. For muscle spasms 40 tablet 2  . dicyclomine (BENTYL) 10 MG capsule Take 1 capsule (10 mg total) by mouth 2 (two) times daily before a meal. 60 capsule 3  . diphenhydrAMINE (BENADRYL) 25 MG tablet Take 1 tablet (25 mg total) by mouth every 6 (six) hours. 20 tablet 0  . EPINEPHrine (EPIPEN) 0.3 mg/0.3 mL SOAJ injection Inject 0.3 mLs (0.3 mg total) into the muscle once. 1 Device 0  . losartan (COZAAR) 100 MG tablet Take 100 mg by mouth  daily.    . meclizine (ANTIVERT) 25 MG tablet Take 25 mg by mouth 3 (three) times daily as needed for dizziness.    . ondansetron (ZOFRAN) 4 MG tablet Take 1 tablet (4 mg total) by mouth every 8 (eight) hours as needed for nausea or vomiting. 30 tablet 1  . Pantoprazole Sodium (PROTONIX PO) Take 1 tablet by mouth as needed.    . TURMERIC PO Take 2 capsules by mouth daily.     No current facility-administered medications for this visit.     Physical Exam Blood pressure 122/87, pulse 76. Physical Exam   The patient is well developed well nourished and well groomed.   Orientation to person place and time is normal   Mood is pleasant. Affect  normal  Ambulatory status is remarkable for a limp in the involved extremity Left lower extremity        Left Knee examination:  Inspection: Tenderness is noted over the medial joint line   ROM: Is limited by pain with a maximum flexion arc of 9010 block to extension  Stability: Collateral ligaments are stable, the Lachman test and anterior and posterior drawer tests are normal   We do palpate medial joint line tenderness and a positive McMurray's for medial meniscal tear   Motor exam: Grade 5 motor strength in the quadriceps musculature   Skin: Warm dry and intact over the right leg                       Neuro: normal sensation   Vascular: 2+ DP pulse with normal color and no edema.   Currently the right lower extremity and knee examination revealed no tenderness or swelling, full range of motion without contracture subluxation atrophy or tremor. Normal muscle tone no instability and the neurovascular status of the limb is normal.    Assessment and Plan:  IMAGING: I have read and interpret the xrays as follows: Medial lateral compartment arthritis mild patellar tilt   Diagnosis and treatment:   Osteoarthritis of the knee Torn medial meniscus  [place] controlled substance reporting system reviewed Meds ordered this encounter  Medications  . AMLODIPINE BESYLATE PO    Sig: Take by mouth.  . traMADol-acetaminophen (ULTRACET) 37.5-325 MG tablet    Sig: Take 1 tablet by mouth every 6 (six) hours as needed.    Dispense:  60 tablet    Refill:  2    Recommend MRI left knee for torn medial meniscus, expect surgery will be needed. Arther Abbott, MD 10/27/2016 10:37 AM

## 2016-10-27 NOTE — Patient Instructions (Signed)
MRI left knee  Ice and elevation as needed rest as much is possible

## 2016-10-27 NOTE — Addendum Note (Signed)
Addended by: Baldomero Lamy B on: 10/27/2016 11:05 AM   Modules accepted: Orders

## 2016-11-03 ENCOUNTER — Ambulatory Visit (HOSPITAL_COMMUNITY)
Admission: RE | Admit: 2016-11-03 | Discharge: 2016-11-03 | Disposition: A | Discharge status: Home / Self Care | Payer: 59 | Source: Ambulatory Visit | Attending: Orthopedic Surgery | Admitting: Orthopedic Surgery

## 2016-11-03 ENCOUNTER — Ambulatory Visit (HOSPITAL_COMMUNITY): Payer: 59

## 2016-11-03 DIAGNOSIS — X58XXXA Exposure to other specified factors, initial encounter: Secondary | ICD-10-CM | POA: Insufficient documentation

## 2016-11-03 DIAGNOSIS — M25562 Pain in left knee: Secondary | ICD-10-CM | POA: Diagnosis not present

## 2016-11-03 DIAGNOSIS — M1712 Unilateral primary osteoarthritis, left knee: Secondary | ICD-10-CM | POA: Diagnosis not present

## 2016-11-03 DIAGNOSIS — M25462 Effusion, left knee: Secondary | ICD-10-CM | POA: Diagnosis not present

## 2016-11-03 DIAGNOSIS — M25762 Osteophyte, left knee: Secondary | ICD-10-CM | POA: Insufficient documentation

## 2016-11-03 DIAGNOSIS — S83242A Other tear of medial meniscus, current injury, left knee, initial encounter: Secondary | ICD-10-CM | POA: Diagnosis not present

## 2016-11-06 ENCOUNTER — Encounter: Payer: Self-pay | Admitting: Orthopedic Surgery

## 2016-11-06 ENCOUNTER — Ambulatory Visit (INDEPENDENT_AMBULATORY_CARE_PROVIDER_SITE_OTHER): Payer: 59 | Admitting: Orthopedic Surgery

## 2016-11-06 DIAGNOSIS — S83242A Other tear of medial meniscus, current injury, left knee, initial encounter: Secondary | ICD-10-CM

## 2016-11-06 NOTE — Patient Instructions (Signed)
You have decided to proceed with operative arthroscopy of the knee. You have decided not to continue with nonoperative measures such as but not limited to oral medication, weight loss, activity modification, physical therapy, bracing, or injection.  We will perform operative arthroscopy of the knee. Some of the risks associated with arthroscopic surgery of the knee include but are not limited to Bleeding Infection Swelling Stiffness Blood clot Pain  If you're not comfortable with these risks and would like to continue with nonoperative treatment please let Dr. Aline Brochure know prior to your surgery.

## 2016-11-06 NOTE — Progress Notes (Signed)
Chief Complaint  Patient presents with  . Follow-up    MRI FOLLOW UP LEFT KNEE    Follow-up after MRI left knee. Patient plays left knee pain and limping. Review of systems no fever at this time. She does have a limp with her gait.  The MRI imaging my interpretation is that she is torn medial meniscus with osteoarthritis of the knee which is diffuse  Discussed possible arthroscopic surgery left knee for torn medial meniscus due to ongoing symptoms. The patient would like to discuss this with her employer and get back to [place] on date and time.  This procedure has been fully reviewed with the patient and written informed consent has been obtained.

## 2016-11-10 ENCOUNTER — Encounter: Payer: Self-pay | Admitting: Internal Medicine

## 2016-11-10 ENCOUNTER — Telehealth: Payer: Self-pay | Admitting: Orthopedic Surgery

## 2016-11-10 NOTE — Telephone Encounter (Signed)
Patient is asking if Dr. Aline Brochure has an opening for surgery for the week of 12/14/16 - 12/18/16. She stated that it would be fine any day of that week. She will be at work until 7 pm tonight [phone] or you can leave a message on her cell.  Please call and advise

## 2016-11-11 NOTE — Telephone Encounter (Signed)
Spoke with patient and she would like to schedule for surgery on March 21. I told her that someone would contact her with pre-op & post op information.

## 2016-11-11 NOTE — Telephone Encounter (Signed)
Please advise first available March 21 or 22.

## 2016-11-23 ENCOUNTER — Telehealth: Payer: Self-pay | Admitting: Orthopedic Surgery

## 2016-11-23 NOTE — Telephone Encounter (Signed)
[name] called and left a message on the answering machine stating to let you know that she wants to keep her surgery on the 21th.  She said you may call her at [phone].  Thanks

## 2016-11-25 ENCOUNTER — Other Ambulatory Visit: Payer: Self-pay | Admitting: *Deleted

## 2016-11-25 ENCOUNTER — Encounter: Payer: Self-pay | Admitting: *Deleted

## 2016-11-25 NOTE — Progress Notes (Signed)
Per patient plan surgical pre authorization is not required for CPT 29881 on outpatient basis  Reference Stephanie C. 11/25/16 4:46pm

## 2016-11-30 MED FILL — ORTHOVISC 15 MG/ML SYRINGE: 30 | 30 days supply | Qty: 6 | Fill #0

## 2016-12-01 MED FILL — ALBUTEROL 0.083% INHAL SOLN: (2.5 MG/3ML | 6 days supply | Qty: 75 | Fill #0

## 2016-12-01 MED FILL — AMLODIPINE BESYLATE 5 MG TA: 5 | 90 days supply | Qty: 90 | Fill #0

## 2016-12-01 MED FILL — LOSARTAN POTASSIUM 100 MG T: 100 | 90 days supply | Qty: 90 | Fill #0

## 2016-12-01 NOTE — Telephone Encounter (Signed)
Disregard, injection is for Right knee  Called and requested pre authorization, plan will call or fax [place] determination

## 2016-12-01 NOTE — Telephone Encounter (Signed)
Patient made aware of this information per nurse.

## 2016-12-01 NOTE — Telephone Encounter (Signed)
How long will patient need to wait after surgery before having orthovisc injections?

## 2016-12-04 ENCOUNTER — Ambulatory Visit: Payer: Self-pay | Admitting: Orthopedic Surgery

## 2016-12-04 DIAGNOSIS — H5213 Myopia, bilateral: Secondary | ICD-10-CM | POA: Diagnosis not present

## 2016-12-08 ENCOUNTER — Ambulatory Visit: Payer: 59 | Admitting: Adult Health

## 2016-12-09 ENCOUNTER — Encounter: Payer: Self-pay | Admitting: Orthopedic Surgery

## 2016-12-11 NOTE — Patient Instructions (Signed)
[name]  12/11/2016     @PREFPERIOPPHARMACY @   Your procedure is scheduled on  12/23/2016   Report to [facility] at  95  A.M.  Call this number if you have problems the morning of surgery:  [phone]   Remember:  Do not eat food or drink liquids after midnight.  Take these medicines the morning of surgery with A SIP OF WATER  Norvasc, flexaril, benadryl (if needed), cozaar, antivert, zofran, protonix, tramdol. Take your nebulizer before you come and your inhaler and bring your rescue inhaler with you.   Do not wear jewelry, make-up or nail polish.  Do not wear lotions, powders, or perfumes, or deoderant.  Do not shave 48 hours prior to surgery.  Men may shave face and neck.  Do not bring valuables to the hospital.  [facility] is not responsible for any belongings or valuables.  Contacts, dentures or bridgework may not be worn into surgery.  Leave your suitcase in the car.  After surgery it may be brought to your room.  For patients admitted to the hospital, discharge time will be determined by your treatment team.  Patients discharged the day of surgery will not be allowed to drive home.   Name and phone number of your driver:   Family Special instructions:  None  Please read over the following fact sheets that you were given. Anesthesia Post-op Instructions and Care and Recovery After Surgery      Knee Ligament Injury, Arthroscopy Arthroscopy is a surgical technique in which your health care provider examines your knee through a small, pencil-sized telescope (arthroscope). Often, repairs to injured ligaments can be done with instruments in the arthroscope. Arthroscopy is less invasive than open-knee surgery. Tell a health care provider about:  Any allergies you have.  All medicines you are taking, including vitamins, herbs, eye drops, creams, and over-the-counter medicines.  Any problems you or family members have had with anesthetic  medicines.  Any blood disorders you have.  Any surgeries you have had.  Any medical conditions you have. What are the risks? Generally, this is a safe procedure. However, as with any procedure, problems can occur. Possible problems include:  Infection.  Bleeding.  Stiffness. What happens before the procedure?  Ask your health care provider about changing or stopping any regular medicines. Avoid taking aspirin or blood thinners as directed by your health care provider.  Do not eat or drink anything after midnight the night before surgery.  If you smoke, do not smoke for at least 2 weeks before your surgery.  Do not drink alcohol starting the day before your surgery.  Let your health care provider know if you develop a cold or any infection before your surgery.  Arrange for someone to drive you home after the surgery or after your hospital stay. Also arrange for someone to help you with activities during recovery. What happens during the procedure?  Small monitors will be put on your body. They are used to check your heart, blood pressure, and oxygen levels.  An IV access tube will be put into one of your veins. Medicine will be able to flow directly into your body through this IV tube.  You might be given a medicine to help you relax (sedative).  You will be given a medicine that makes you go to sleep (general anesthetic), and a breathing tube will be placed into your lungs during the procedure.  Several small incisions  are made in your knee. Saline fluid is placed into one of the incisions to expand the knee and clear away any blood in the knee.  Your health care provider will insert the arthroscope to examine the injured knee.  During arthroscopy, your health care provider may find a partial or complete tear in a ligament.  Tools can be inserted through the other incisions to repair the injured ligaments.  The incisions are then closed with absorbable stitches and  covered with dressings. What happens after the procedure?  You will be taken to the recovery area where you will be monitored.  When you are awake, stable, and taking fluids without problems, you will be allowed to go home. This information is not intended to replace advice given to you by your health care provider. Make sure you discuss any questions you have with your health care provider. Document Released: 09/18/2000 Document Revised: 02/27/2016 Document Reviewed: 05/03/2013 Elsevier Interactive Patient Education  2017 [place].  Arthroscopic Knee Ligament Repair, Care After This sheet gives you information about how to care for yourself after your procedure. Your health care provider may also give you more specific instructions. If you have problems or questions, contact your health care provider. What can I expect after the procedure? After the procedure, it is common to have:  Pain in your knee.  Bruising and swelling on your knee, calf, and ankle for 3-4 days.  Fatigue. Follow these instructions at home: If you have a brace or immobilizer:   Wear the brace or immobilizer as told by your health care provider. Remove it only as told by your health care provider.  Loosen the splint or immobilizer if your toes tingle, become numb, or turn cold and blue.  Keep the brace or immobilizer clean. Bathing   Do not take baths, swim, or use a hot tub until your health care provider approves. Ask your health care provider if you can take showers.  Keep your bandage (dressing) dry until your health care provider says that it can be removed. Cover it and your brace or immobilizer with a watertight covering when you take a shower. Incision care   Follow instructions from your health care provider about how to take care of your incision. Make sure you:  Wash your hands with soap and water before you change your bandage (dressing). If soap and water are not available, use hand  sanitizer.  Change your dressing as told by your health care provider.  Leave stitches (sutures), skin glue, or adhesive strips in place. These skin closures may need to stay in place for 2 weeks or longer. If adhesive strip edges start to loosen and curl up, you may trim the loose edges. Do not remove adhesive strips completely unless your health care provider tells you to do that.  Check your incision area every day for signs of infection. Check for:  More redness, swelling, or pain.  More fluid or blood.  Warmth.  Pus or a bad smell. Managing pain, stiffness, and swelling   If directed, put ice on the affected area.  If you have a removable brace or immobilizer, remove it as told by your health care provider.  Put ice in a plastic bag.  Place a towel between your skin and the bag or between your brace or immobilizer and the bag.  Leave the ice on for 20 minutes, 2-3 times a day.  Move your toes often to avoid stiffness and to lessen swelling.  Raise (  elevate) the injured area above the level of your heart while you are sitting or lying down. Driving   Do not drive until your health care provider approves. If you have a brace or immobilizer on your leg, ask your health care provider when it is safe for you to drive.  Do not drive or use heavy machinery while taking prescription pain medicine. Activity   Rest as directed. Ask your health care provider what activities are safe for you.  Do physical therapy exercises as told by your health care provider. Physical therapy will help you regain strength and motion in your knee.  Follow instructions from your health care provider about:  When you may start motion exercises.  When you may start riding a stationary bike and doing other low-impact activities.  When you may start to jog and do other high-impact activities. Safety   Do not use the injured limb to support your body weight until your health care provider says  that you can. Use crutches as told by your health care provider. General instructions   Do not use any products that contain nicotine or tobacco, such as cigarettes and e-cigarettes. These can delay bone healing. If you need help quitting, ask your health care provider.  To prevent or treat constipation while you are taking prescription pain medicine, your health care provider may recommend that you:  Drink enough fluid to keep your urine clear or pale yellow.  Take over-the-counter or prescription medicines.  Eat foods that are high in fiber, such as fresh fruits and vegetables, whole grains, and beans.  Limit foods that are high in fat and processed sugars, such as fried and sweet foods.  Take over-the-counter and prescription medicines only as told by your health care provider.  Keep all follow-up visits as told by your health care provider. This is important. Contact a health care provider if:  You have more redness, swelling, or pain around an incision.  You have more fluid or blood coming from an incision.  Your incision feels warm to the touch.  You have a fever.  You have pain or swelling in your knee, and it gets worse.  You have pain that does not get better with medicine. Get help right away if:  You have trouble breathing.  You have pus or a bad smell coming from an incision.  You have numbness and tingling near the knee joint. Summary  After the procedure, it is common to have knee pain with bruising and swelling on your knee, calf, and ankle.  Icing your knee and raising your leg above the level of your heart will help control the pain and the swelling.  Do physical therapy exercises as told by your health care provider. Physical therapy will help you regain strength and motion in your knee. This information is not intended to replace advice given to you by your health care provider. Make sure you discuss any questions you have with your health care  provider. Document Released: 07/12/2013 Document Revised: 09/15/2016 Document Reviewed: 09/15/2016 Elsevier Interactive Patient Education  2017 [employer] PATIENT INSTRUCTIONS POST-ANESTHESIA  IMMEDIATELY FOLLOWING SURGERY:  Do not drive or operate machinery for the first twenty four hours after surgery.  Do not make any important decisions for twenty four hours after surgery or while taking narcotic pain medications or sedatives.  If you develop intractable nausea and vomiting or a severe headache please notify your doctor immediately.  FOLLOW-UP:  Please make an appointment with your surgeon  as instructed. You do not need to follow up with anesthesia unless specifically instructed to do so.  WOUND CARE INSTRUCTIONS (if applicable):  Keep a dry clean dressing on the anesthesia/puncture wound site if there is drainage.  Once the wound has quit draining you may leave it open to air.  Generally you should leave the bandage intact for twenty four hours unless there is drainage.  If the epidural site drains for more than 36-48 hours please call the anesthesia department.  QUESTIONS?:  Please feel free to call your physician or the hospital operator if you have any questions, and they will be happy to assist you.

## 2016-12-16 ENCOUNTER — Encounter (HOSPITAL_COMMUNITY)
Admission: RE | Admit: 2016-12-16 | Discharge: 2016-12-16 | Disposition: A | Discharge status: Home / Self Care | Payer: 59 | Source: Ambulatory Visit | Attending: Orthopedic Surgery | Admitting: Orthopedic Surgery

## 2016-12-16 ENCOUNTER — Encounter (HOSPITAL_COMMUNITY): Payer: Self-pay

## 2016-12-16 DIAGNOSIS — Z01812 Encounter for preprocedural laboratory examination: Secondary | ICD-10-CM | POA: Diagnosis not present

## 2016-12-16 HISTORY — DX: Unspecified osteoarthritis, unspecified site: M19.90

## 2016-12-16 HISTORY — DX: Essential (primary) hypertension: I10

## 2016-12-16 HISTORY — DX: Unspecified asthma, uncomplicated: J45.909

## 2016-12-16 LAB — CBC WITH DIFFERENTIAL/PLATELET
BASOS ABS: 0 10*3/uL (ref 0.0–0.1)
BASOS PCT: 1 %
EOS ABS: 0.2 10*3/uL (ref 0.0–0.7)
EOS PCT: 3 %
HCT: 38.9 % (ref 36.0–46.0)
Hemoglobin: 13.1 g/dL (ref 12.0–15.0)
LYMPHS PCT: 31 %
Lymphs Abs: 1.7 10*3/uL (ref 0.7–4.0)
MCH: 29.1 pg (ref 26.0–34.0)
MCHC: 33.7 g/dL (ref 30.0–36.0)
MCV: 86.4 fL (ref 78.0–100.0)
Monocytes Absolute: 0.4 10*3/uL (ref 0.1–1.0)
Monocytes Relative: 8 %
Neutro Abs: 3.2 10*3/uL (ref 1.7–7.7)
Neutrophils Relative %: 57 %
PLATELETS: 380 10*3/uL (ref 150–400)
RBC: 4.5 MIL/uL (ref 3.87–5.11)
RDW: 14.2 % (ref 11.5–15.5)
WBC: 5.5 10*3/uL (ref 4.0–10.5)

## 2016-12-16 LAB — BASIC METABOLIC PANEL
ANION GAP: 7 (ref 5–15)
BUN: 13 mg/dL (ref 6–20)
CO2: 25 mmol/L (ref 22–32)
Calcium: 9.1 mg/dL (ref 8.9–10.3)
Chloride: 104 mmol/L (ref 101–111)
Creatinine, Ser: 0.7 mg/dL (ref 0.44–1.00)
Glucose, Bld: 114 mg/dL — ABNORMAL HIGH (ref 65–99)
POTASSIUM: 3.9 mmol/L (ref 3.5–5.1)
SODIUM: 136 mmol/L (ref 135–145)

## 2016-12-17 ENCOUNTER — Other Ambulatory Visit (HOSPITAL_COMMUNITY): Payer: Self-pay

## 2016-12-22 NOTE — H&P (Signed)
Chief Complaint  Patient presents with  . Knee Pain      LEFT KNEE PAIN      [name] is a 51 y.o. female.   HPI 51 year-old female history of chronic bilateral knee pain status post arthroscopy right knee presents with acute onset of pain in the left knee acute pop loss of motion swelling   She has difficulty weightbearing so she went to the primary care doctor who gave her shot of cortisone did not really help   Has some catching when the knee goes into extension   Review of Systems  Constitutional: Negative for chills, fever and weight loss.  Respiratory: Negative for shortness of breath.   Cardiovascular: Negative for chest pain.  Neurological: Negative for tingling.      Review of Systems See hpi  Social History  Substance Use Topics  . Smoking status: Never Smoker  . Smokeless tobacco: Never Used  . Alcohol use No          Past Medical History:  Diagnosis Date  . Chest pain      cath 2005 norm cors, repeat cath Feb 2011 normal cors and only minimally  elevated pulmonary pressures  . Crohn disease ([place])    . Diverticulosis    . GERD (gastroesophageal reflux disease)    . Hiatal hernia    . Obesity    . Palpitations    . Sleep apnea sleep study 11/03/2009    mild-no cpap           Past Surgical History:  Procedure Laterality Date  . ABDOMINAL HYSTERECTOMY      . ANKLE ARTHROSCOPY   06/01/2012    Procedure: ANKLE ARTHROSCOPY;  Surgeon: Colin Rhein, MD;  Location: [facility];  Service: Orthopedics;  Laterality: Left;  left ankle arthorsocpy with extensive debridement and gastroc slide  . CARDIAC CATHETERIZATION   11/22/2009 and 2005    WNL  . CESAREAN SECTION      . CHOLECYSTECTOMY   09/08/2006    lap. chole.  . CHONDROPLASTY   06/17/2012    Procedure: CHONDROPLASTY;  Surgeon: Carole Civil, MD;  Location: [facility];  Service: Orthopedics;  Laterality: Right;  right patella  . ESOPHAGOGASTRODUODENOSCOPY N/A 05/14/2015    Procedure:  ESOPHAGOGASTRODUODENOSCOPY (EGD);  Surgeon: Rogene Houston, MD;  Location: [facility];  Service: Endoscopy;  Laterality: N/A;  730  . INGUINAL HERNIA REPAIR   10/30/2008    right  . SHOULDER SURGERY        right x 2            Allergies  Allergen Reactions  . Other Shortness Of Breath      Lemon grass  . Wasp Venom Anaphylaxis and Shortness Of Breath  . Hydromorphone Hcl Nausea And Vomiting  . Omnipaque [Iohexol] Hives and Nausea Only      Pt. Was premedicated with emergent protocol.  . Pollen Extract Swelling  . Adhesive [Tape] Rash            Current Outpatient Prescriptions  Medication Sig Dispense Refill  . albuterol (PROVENTIL) (2.5 MG/3ML) 0.083% nebulizer solution Take 2.5 mg by nebulization every 6 (six) hours as needed. For shortness of breath      . AMLODIPINE BESYLATE PO Take by mouth.      . cyclobenzaprine (FLEXERIL) 10 MG tablet Take 1 tablet (10 mg total) by mouth 3 (three) times daily as needed. For muscle spasms 40 tablet 2  .  dicyclomine (BENTYL) 10 MG capsule Take 1 capsule (10 mg total) by mouth 2 (two) times daily before a meal. 60 capsule 3  . diphenhydrAMINE (BENADRYL) 25 MG tablet Take 1 tablet (25 mg total) by mouth every 6 (six) hours. 20 tablet 0  . EPINEPHrine (EPIPEN) 0.3 mg/0.3 mL SOAJ injection Inject 0.3 mLs (0.3 mg total) into the muscle once. 1 Device 0  . losartan (COZAAR) 100 MG tablet Take 100 mg by mouth daily.      . meclizine (ANTIVERT) 25 MG tablet Take 25 mg by mouth 3 (three) times daily as needed for dizziness.      . ondansetron (ZOFRAN) 4 MG tablet Take 1 tablet (4 mg total) by mouth every 8 (eight) hours as needed for nausea or vomiting. 30 tablet 1  . Pantoprazole Sodium (PROTONIX PO) Take 1 tablet by mouth as needed.      . TURMERIC PO Take 2 capsules by mouth daily.        No current facility-administered medications for this visit.       Family History  Problem Relation Age of Onset  . Hypertension Mother   . Lupus Father    . COPD Father   . Congestive Heart Failure Maternal Grandmother     Physical Exam Blood pressure 122/87, pulse 76. Physical Exam    The patient is well developed well nourished and well groomed.   Orientation to person place and time is normal   Mood is pleasant. Affect normal  Ambulatory status is remarkable for a limp in the involved extremity Left lower extremity         Left Knee examination:  Inspection: Tenderness is noted over the medial joint line   ROM: Is limited by pain with a maximum flexion arc of 9010 block to extension  Stability: Collateral ligaments are stable, the Lachman test and anterior and posterior drawer tests are normal    We do palpate medial joint line tenderness and a positive McMurray's for medial meniscal tear    Motor exam: Grade 5 motor strength in the quadriceps musculature    Skin: Warm dry and intact over the right leg                       Neuro: normal sensation   Vascular: 2+ DP pulse with normal color and no edema.    Currently the right lower extremity and knee examination revealed no tenderness or swelling, full range of motion without contracture subluxation atrophy or tremor. Normal muscle tone no instability and the neurovascular status of the limb is normal.       Assessment and Plan:  IMAGING: I have read and interpret the xrays as follows: Medial lateral compartment arthritis mild patellar tilt     Diagnosis and treatment:    Osteoarthritis of the knee Torn medial meniscus   Plan arthroscopy and partial medial meniscectomy left knee

## 2016-12-23 ENCOUNTER — Ambulatory Visit (HOSPITAL_COMMUNITY)
Admission: RE | Admit: 2016-12-23 | Discharge: 2016-12-23 | Disposition: A | Discharge status: Home / Self Care | Payer: 59 | Source: Ambulatory Visit | Attending: Orthopedic Surgery | Admitting: Orthopedic Surgery

## 2016-12-23 ENCOUNTER — Ambulatory Visit (HOSPITAL_COMMUNITY): Payer: 59 | Admitting: Anesthesiology

## 2016-12-23 ENCOUNTER — Encounter (HOSPITAL_COMMUNITY): Payer: Self-pay | Admitting: *Deleted

## 2016-12-23 ENCOUNTER — Encounter (HOSPITAL_COMMUNITY)
Admission: RE | Disposition: A | Discharge status: Home / Self Care | Payer: Self-pay | Source: Ambulatory Visit | Attending: Orthopedic Surgery

## 2016-12-23 DIAGNOSIS — Z6835 Body mass index (BMI) 35.0-35.9, adult: Secondary | ICD-10-CM | POA: Diagnosis not present

## 2016-12-23 DIAGNOSIS — M659 Synovitis and tenosynovitis, unspecified: Secondary | ICD-10-CM | POA: Insufficient documentation

## 2016-12-23 DIAGNOSIS — M23322 Other meniscus derangements, posterior horn of medial meniscus, left knee: Secondary | ICD-10-CM | POA: Diagnosis not present

## 2016-12-23 DIAGNOSIS — M23342 Other meniscus derangements, anterior horn of lateral meniscus, left knee: Secondary | ICD-10-CM

## 2016-12-23 DIAGNOSIS — K219 Gastro-esophageal reflux disease without esophagitis: Secondary | ICD-10-CM | POA: Insufficient documentation

## 2016-12-23 DIAGNOSIS — Z79899 Other long term (current) drug therapy: Secondary | ICD-10-CM | POA: Diagnosis not present

## 2016-12-23 DIAGNOSIS — X58XXXA Exposure to other specified factors, initial encounter: Secondary | ICD-10-CM | POA: Diagnosis not present

## 2016-12-23 DIAGNOSIS — E669 Obesity, unspecified: Secondary | ICD-10-CM | POA: Diagnosis not present

## 2016-12-23 DIAGNOSIS — S83282A Other tear of lateral meniscus, current injury, left knee, initial encounter: Secondary | ICD-10-CM | POA: Insufficient documentation

## 2016-12-23 DIAGNOSIS — M1712 Unilateral primary osteoarthritis, left knee: Secondary | ICD-10-CM | POA: Insufficient documentation

## 2016-12-23 DIAGNOSIS — S83242A Other tear of medial meniscus, current injury, left knee, initial encounter: Secondary | ICD-10-CM | POA: Insufficient documentation

## 2016-12-23 HISTORY — PX: KNEE ARTHROSCOPY WITH MEDIAL MENISECTOMY: SHX5651

## 2016-12-23 HISTORY — PX: KNEE ARTHROSCOPY WITH LATERAL MENISECTOMY: SHX6193

## 2016-12-23 SURGERY — ARTHROSCOPY, KNEE, WITH MEDIAL MENISCECTOMY
Anesthesia: General | Site: Knee | Laterality: Left

## 2016-12-23 MED ORDER — EPHEDRINE SULFATE 50 MG/ML IJ SOLN
INTRAMUSCULAR | Status: AC
  Filled 2016-12-23: qty 1

## 2016-12-23 MED ORDER — EPINEPHRINE PF 1 MG/ML IJ SOLN
INTRAMUSCULAR | Status: DC | PRN
  Administered 2016-12-23 (×6): 3000 mL

## 2016-12-23 MED ORDER — BUPIVACAINE-EPINEPHRINE (PF) 0.5% -1:200000 IJ SOLN
INTRAMUSCULAR | Status: AC
  Filled 2016-12-23: qty 60

## 2016-12-23 MED ORDER — LACTATED RINGERS IV SOLN
INTRAVENOUS | Status: DC
  Administered 2016-12-23: 07:00:00 via INTRAVENOUS

## 2016-12-23 MED ORDER — KETOROLAC TROMETHAMINE 30 MG/ML IJ SOLN
30.0000 mg | Freq: Once | INTRAMUSCULAR | Status: AC
  Administered 2016-12-23: 30 mg via INTRAVENOUS
  Filled 2016-12-23: qty 1

## 2016-12-23 MED ORDER — DEXAMETHASONE SODIUM PHOSPHATE 4 MG/ML IJ SOLN
INTRAMUSCULAR | Status: AC
  Filled 2016-12-23: qty 1

## 2016-12-23 MED ORDER — ONDANSETRON HCL 4 MG/2ML IJ SOLN
INTRAMUSCULAR | Status: AC
Start: 2016-12-23 — End: 2016-12-23
  Filled 2016-12-23: qty 2

## 2016-12-23 MED ORDER — CHLORHEXIDINE GLUCONATE 4 % EX LIQD: 60.0000 mL | Freq: Once | CUTANEOUS | Status: DC

## 2016-12-23 MED ORDER — CEFAZOLIN SODIUM-DEXTROSE 2-4 GM/100ML-% IV SOLN
2.0000 g | INTRAVENOUS | Status: AC
  Administered 2016-12-23: 2 g via INTRAVENOUS
  Filled 2016-12-23: qty 100

## 2016-12-23 MED ORDER — MIDAZOLAM HCL 2 MG/2ML IJ SOLN
INTRAMUSCULAR | Status: AC
  Filled 2016-12-23: qty 2

## 2016-12-23 MED ORDER — EPINEPHRINE PF 1 MG/ML IJ SOLN
INTRAMUSCULAR | Status: AC
  Filled 2016-12-23: qty 4

## 2016-12-23 MED ORDER — PROPOFOL 10 MG/ML IV BOLUS
INTRAVENOUS | Status: DC | PRN
  Administered 2016-12-23: 200 mg via INTRAVENOUS

## 2016-12-23 MED ORDER — FENTANYL CITRATE (PF) 100 MCG/2ML IJ SOLN
INTRAMUSCULAR | Status: DC | PRN
  Administered 2016-12-23 (×3): 50 ug via INTRAVENOUS
  Administered 2016-12-23 (×2): 25 ug via INTRAVENOUS

## 2016-12-23 MED ORDER — MIDAZOLAM HCL 2 MG/2ML IJ SOLN
1.0000 mg | INTRAMUSCULAR | Status: AC
  Administered 2016-12-23 (×2): 2 mg via INTRAVENOUS
  Filled 2016-12-23: qty 2

## 2016-12-23 MED ORDER — LIDOCAINE HCL (CARDIAC) 10 MG/ML IV SOLN
INTRAVENOUS | Status: DC | PRN
  Administered 2016-12-23: 50 mg via INTRAVENOUS

## 2016-12-23 MED ORDER — HYDROCODONE-ACETAMINOPHEN 5-325 MG PO TABS: 1.0000 | ORAL_TABLET | ORAL | 0 refills | Status: DC | PRN

## 2016-12-23 MED ORDER — BUPIVACAINE-EPINEPHRINE (PF) 0.5% -1:200000 IJ SOLN
INTRAMUSCULAR | Status: DC | PRN
  Administered 2016-12-23: 60 mL

## 2016-12-23 MED ORDER — PROPOFOL 10 MG/ML IV BOLUS
INTRAVENOUS | Status: AC
  Filled 2016-12-23: qty 20

## 2016-12-23 MED ORDER — ONDANSETRON HCL 4 MG/2ML IJ SOLN
4.0000 mg | Freq: Once | INTRAMUSCULAR | Status: AC
  Administered 2016-12-23: 4 mg via INTRAVENOUS
  Filled 2016-12-23: qty 2

## 2016-12-23 MED ORDER — IBUPROFEN 800 MG PO TABS
800.0000 mg | ORAL_TABLET | Freq: Once | ORAL | Status: AC
  Administered 2016-12-23: 800 mg via ORAL

## 2016-12-23 MED ORDER — HYDROCODONE-ACETAMINOPHEN 7.5-325 MG PO TABS
1.0000 | ORAL_TABLET | Freq: Once | ORAL | Status: AC
  Administered 2016-12-23: 1 via ORAL
  Filled 2016-12-23: qty 1

## 2016-12-23 MED ORDER — IBUPROFEN 800 MG PO TABS
ORAL_TABLET | ORAL | Status: AC
  Filled 2016-12-23: qty 1

## 2016-12-23 MED ORDER — LIDOCAINE HCL (PF) 1 % IJ SOLN
INTRAMUSCULAR | Status: AC
  Filled 2016-12-23: qty 5

## 2016-12-23 MED ORDER — DEXAMETHASONE SODIUM PHOSPHATE 4 MG/ML IJ SOLN
4.0000 mg | Freq: Once | INTRAMUSCULAR | Status: AC
  Administered 2016-12-23: 4 mg via INTRAVENOUS

## 2016-12-23 MED ORDER — SODIUM CHLORIDE 0.9 % IJ SOLN
INTRAMUSCULAR | Status: AC
  Filled 2016-12-23: qty 10

## 2016-12-23 MED ORDER — FENTANYL CITRATE (PF) 100 MCG/2ML IJ SOLN
25.0000 ug | INTRAMUSCULAR | Status: DC | PRN
  Administered 2016-12-23 (×3): 50 ug via INTRAVENOUS
  Filled 2016-12-23 (×2): qty 2

## 2016-12-23 MED ORDER — EPINEPHRINE PF 1 MG/ML IJ SOLN
INTRAMUSCULAR | Status: AC
  Filled 2016-12-23: qty 2

## 2016-12-23 MED ORDER — FENTANYL CITRATE (PF) 250 MCG/5ML IJ SOLN
INTRAMUSCULAR | Status: AC
  Filled 2016-12-23: qty 5

## 2016-12-23 MED ORDER — ONDANSETRON HCL 4 MG/2ML IJ SOLN
4.0000 mg | Freq: Once | INTRAMUSCULAR | Status: AC
  Administered 2016-12-23: 4 mg via INTRAVENOUS

## 2016-12-23 SURGICAL SUPPLY — 51 items
BAG HAMPER (MISCELLANEOUS) ×2 IMPLANT
BANDAGE ELASTIC 6 LF NS (GAUZE/BANDAGES/DRESSINGS) ×2 IMPLANT
BLADE AGGRESSIVE PLUS 4.0 (BLADE) ×2 IMPLANT
BLADE SURG SZ11 CARB STEEL (BLADE) ×2 IMPLANT
BNDG CMPR MED 5X6 ELC HKLP NS (GAUZE/BANDAGES/DRESSINGS) ×1
CHLORAPREP W/TINT 26ML (MISCELLANEOUS) ×2 IMPLANT
CLOTH BEACON ORANGE TIMEOUT ST (SAFETY) ×2 IMPLANT
CUFF TOURNIQUET SINGLE 34IN LL (TOURNIQUET CUFF) ×1 IMPLANT
CUTTER ANGLED DBL BITE 4.5 (BURR) IMPLANT
DECANTER SPIKE VIAL GLASS SM (MISCELLANEOUS) ×4 IMPLANT
GAUZE SPONGE 4X4 12PLY STRL (GAUZE/BANDAGES/DRESSINGS) ×2 IMPLANT
GAUZE SPONGE 4X4 16PLY XRAY LF (GAUZE/BANDAGES/DRESSINGS) ×2 IMPLANT
GAUZE XEROFORM 5X9 LF (GAUZE/BANDAGES/DRESSINGS) ×2 IMPLANT
GLOVE BIO SURGEON STRL SZ7 (GLOVE) ×1 IMPLANT
GLOVE BIOGEL PI IND STRL 7.0 (GLOVE) ×1 IMPLANT
GLOVE BIOGEL PI INDICATOR 7.0 (GLOVE) ×1
GLOVE EXAM NITRILE MD LF STRL (GLOVE) ×1 IMPLANT
GLOVE SKINSENSE NS SZ8.0 LF (GLOVE) ×1
GLOVE SKINSENSE STRL SZ8.0 LF (GLOVE) ×1 IMPLANT
GLOVE SS N UNI LF 8.5 STRL (GLOVE) ×2 IMPLANT
GOWN STRL REUS W/ TWL LRG LVL3 (GOWN DISPOSABLE) ×1 IMPLANT
GOWN STRL REUS W/TWL LRG LVL3 (GOWN DISPOSABLE) ×2
GOWN STRL REUS W/TWL XL LVL3 (GOWN DISPOSABLE) ×2 IMPLANT
HLDR LEG FOAM (MISCELLANEOUS) ×1 IMPLANT
IV NS IRRIG 3000ML ARTHROMATIC (IV SOLUTION) ×8 IMPLANT
KIT BLADEGUARD II DBL (SET/KITS/TRAYS/PACK) ×2 IMPLANT
KIT ROOM TURNOVER AP CYSTO (KITS) ×2 IMPLANT
LEG HOLDER FOAM (MISCELLANEOUS) ×1
MANIFOLD NEPTUNE II (INSTRUMENTS) ×2 IMPLANT
MARKER SKIN DUAL TIP RULER LAB (MISCELLANEOUS) ×2 IMPLANT
NDL HYPO 18GX1.5 BLUNT FILL (NEEDLE) ×1 IMPLANT
NDL HYPO 21X1.5 SAFETY (NEEDLE) ×1 IMPLANT
NDL SPNL 18GX3.5 QUINCKE PK (NEEDLE) ×1 IMPLANT
NEEDLE HYPO 18GX1.5 BLUNT FILL (NEEDLE) ×2 IMPLANT
NEEDLE HYPO 21X1.5 SAFETY (NEEDLE) ×2 IMPLANT
NEEDLE SPNL 18GX3.5 QUINCKE PK (NEEDLE) ×2 IMPLANT
NS IRRIG 1000ML POUR BTL (IV SOLUTION) ×2 IMPLANT
PACK ARTHRO LIMB DRAPE STRL (MISCELLANEOUS) ×2 IMPLANT
PAD ABD 5X9 TENDERSORB (GAUZE/BANDAGES/DRESSINGS) ×2 IMPLANT
PAD ARMBOARD 7.5X6 YLW CONV (MISCELLANEOUS) ×2 IMPLANT
PADDING CAST COTTON 6X4 STRL (CAST SUPPLIES) ×2 IMPLANT
PADDING WEBRIL 6 STERILE (GAUZE/BANDAGES/DRESSINGS) ×1 IMPLANT
PROBE BIPOLAR 50 DEGREE SUCT (MISCELLANEOUS) ×1 IMPLANT
PROBE BIPOLAR ATHRO 135MM 90D (MISCELLANEOUS) ×2 IMPLANT
SET ARTHROSCOPY INST (INSTRUMENTS) ×2 IMPLANT
SET ARTHROSCOPY PUMP TUBE (IRRIGATION / IRRIGATOR) ×2 IMPLANT
SET BASIN LINEN APH (SET/KITS/TRAYS/PACK) ×2 IMPLANT
SUT ETHILON 3 0 FSL (SUTURE) ×2 IMPLANT
SYR 30ML LL (SYRINGE) ×2 IMPLANT
SYRINGE 10CC LL (SYRINGE) ×2 IMPLANT
TUBE CONNECTING 12X1/4 (SUCTIONS) ×6 IMPLANT

## 2016-12-23 NOTE — Discharge Instructions (Signed)
Remove the Cryo/Cuff when you are walking  Remove the dressing friday, apply band-aids  Start knee exercises by bending the knee 253 times a day on the second day after surgery  Apply weight to the operative knee and leg as tolerated   Operative findings ( what was found)  Torn medial meniscus, torn lateral meniscus, arthritis, synovitis/inflammation  What was done  Removal of a portion of the medial meniscus, lateral meniscus, shaving of the arthritic lesions and removal of synovial inflammation   Knee Arthroscopy, Care After Refer to this sheet in the next few weeks. These instructions provide you with information about caring for yourself after your procedure. Your health care provider may also give you more specific instructions. Your treatment has been planned according to current medical practices, but problems sometimes occur. Call your health care provider if you have any problems or questions after your procedure. What can I expect after the procedure? After the procedure, it is common to have:  Soreness.  Pain. Follow these instructions at home: Bathing   Do not take baths, swim, or use a hot tub until your health care provider approves. Incision care   There are many different ways to close and cover an incision, including stitches, skin glue, and adhesive strips. Follow your health care providers instructions about:  Incision care.  Bandage (dressing) changes and removal.  Incision closure removal.  Check your incision area every day for signs of infection. Watch for:  Redness, swelling, or pain.  Fluid, blood, or pus. Activity   Avoid strenuous activities for as long as directed by your health care provider.  Return to your normal activities as directed by your health care provider. Ask your health care provider what activities are safe for you.  Perform range-of-motion exercises only as directed by your health care provider.  Do not lift anything that  is heavier than 10 lb (4.5 kg).  Do not drive or operate heavy machinery while taking pain medicine.  If you were given crutches, use them as directed by your health care provider. Managing pain, stiffness, and swelling   If directed, apply ice to the injured area:  Put ice in a plastic bag.  Place a towel between your skin and the bag.  Leave the ice on for 20 minutes, 2-3 times per day.  Raise the injured area above the level of your heart while you are sitting or lying down as directed by your health care provider. General instructions   Keep all follow-up visits as directed by your health care provider. This is important.  Take medicines only as directed by your health care provider.  Do not use any tobacco products, including cigarettes, chewing tobacco, or electronic cigarettes. If you need help quitting, ask your health care provider.  If you were given compression stockings, wear them as directed by your health care provider. These stockings help prevent blood clots and reduce swelling in your legs. Contact a health care provider if:  You have severe pain with any movement of your knee.  You notice a bad smell coming from the incision or dressing.  You have redness, swelling, or pain at the site of your incision.  You have fluid, blood, or pus coming from your incision. Get help right away if:  You develop a rash.  You have a fever.  You have difficulty breathing or have shortness of breath.  You develop pain in your calves or in the back of your knee.  You develop chest  pain.  You develop numbness or tingling in your leg or foot. This information is not intended to replace advice given to you by your health care provider. Make sure you discuss any questions you have with your health care provider. Document Released: 04/10/2005 Document Revised: 02/21/2016 Document Reviewed: 09/17/2014 Elsevier Interactive Patient Education  2017 [employer]  Anesthesia, Adult, Care After These instructions provide you with information about caring for yourself after your procedure. Your health care provider may also give you more specific instructions. Your treatment has been planned according to current medical practices, but problems sometimes occur. Call your health care provider if you have any problems or questions after your procedure. What can I expect after the procedure? After the procedure, it is common to have:  Vomiting.  A sore throat.  Mental slowness. It is common to feel:  Nauseous.  Cold or shivery.  Sleepy.  Tired.  Sore or achy, even in parts of your body where you did not have surgery. Follow these instructions at home: For at least 24 hours after the procedure:   Do not:  Participate in activities where you could fall or become injured.  Drive.  Use heavy machinery.  Drink alcohol.  Take sleeping pills or medicines that cause drowsiness.  Make important decisions or sign legal documents.  Take care of children on your own.  Rest. Eating and drinking   If you vomit, drink water, juice, or soup when you can drink without vomiting.  Drink enough fluid to keep your urine clear or pale yellow.  Make sure you have little or no nausea before eating solid foods.  Follow the diet recommended by your health care provider. General instructions   Have a responsible adult stay with you until you are awake and alert.  Return to your normal activities as told by your health care provider. Ask your health care provider what activities are safe for you.  Take over-the-counter and prescription medicines only as told by your health care provider.  If you smoke, do not smoke without supervision.  Keep all follow-up visits as told by your health care provider. This is important. Contact a health care provider if:  You continue to have nausea or vomiting at home, and medicines are not helpful.  You cannot  drink fluids or start eating again.  You cannot urinate after 8-12 hours.  You develop a skin rash.  You have fever.  You have increasing redness at the site of your procedure. Get help right away if:  You have difficulty breathing.  You have chest pain.  You have unexpected bleeding.  You feel that you are having a life-threatening or urgent problem. This information is not intended to replace advice given to you by your health care provider. Make sure you discuss any questions you have with your health care provider. Document Released: 12/28/2000 Document Revised: 02/24/2016 Document Reviewed: 09/05/2015 Elsevier Interactive Patient Education  2017 [employer]  Cryotherapy WHAT IS CRYOTHERAPY? Cryotherapy, or cold therapy, is a treatment that uses cold temperatures to treat an injury or medical condition. It includes using cold packs or ice packs to reduce pain and swelling. Only use cryotherapy if your doctor says it is okay. HOW DO I USE CRYOTHERAPY?  Place a towel between the cold source and your skin.  Apply the cold source for no more than 20 minutes at a time.  Check your skin after 5 minutes to make sure there are no signs of a poor response  to cold or skin damage. Check for:  White spots on your skin. Your skin may look blotchy or mottled.  Skin that looks blue or pale.  Skin that feels waxy or hard.  Repeat these steps as many times each day as told by your doctor. HOW CAN I MAKE A COLD PACK? When using a cold pack at home to reduce pain and swelling, you can use:  A silica gel cold pack that has been left in the freezer. You can buy this online or in stores.  A plastic bag of frozen vegetables.  A sealable plastic bag that has been filled with crushed ice. Always wrap the pack in a dry or damp towel to avoid direct contact with your skin. WHEN SHOULD I CALL MY DOCTOR? Call your doctor if:  You start to have white spots on your skin. This may give  your skin a blotchy or mottled look.  Your skin turns blue or pale.  Your skin becomes waxy or hard.  Your swelling gets worse. This information is not intended to replace advice given to you by your health care provider. Make sure you discuss any questions you have with your health care provider. Document Released: 03/09/2008 Document Revised: 02/27/2016 Document Reviewed: 06/05/2015 Elsevier Interactive Patient Education  2017 [employer].

## 2016-12-23 NOTE — Op Note (Signed)
Knee arthroscopy dictation CPT CODE 84720  Preop diagnosis torn medial meniscus left knee Postop diagnosis torn medial meniscus left knee, torn lateral meniscus, arthritis and synovitis Procedure arthroscopy left knee partial medial meniscectomy, partial lateral meniscectomy, chondroplasty medial femoral condyle and patella and synovial resection partial  Surgeon Aline Brochure  Anesthesia Gen. by LMA   No complications  Sponge and needle counts were correct    The patient was identified in the preoperative holding area using 2 approved identification mechanisms. The chart was reviewed and updated. The surgical site was confirmed as left knee and marked with an indelible marker.  The patient was taken to the operating room for anesthesia. After successful  GENEREAL  anesthesia, 2GM ANCEF was used as IV antibiotics.  The patient was placed in the supine position with the (LEFT) the operative extremity in an arthroscopic leg holder and the opposite extremity in a padded leg holder.  The timeout was executed.  A lateral portal was established with an 11 blade and the scope was introduced into the joint. A diagnostic arthroscopy was performed in circumferential manner examining the entire knee joint. A medial portal was established and the diagnostic arthroscopy was repeated using a probe to palpate intra-articular structures as they were encountered.   The operative findings are   Medial: complex tear posterior horn medial meniscus,  chondral arthritis medial femoral condyle grade 2 Lateral :arthritis lateral compartment with torn free edges of the lateral meniscus  Patellofemoral grade 4 chondral lesion trochlea diffuse grade 2 chondral lesion medial and lateral facet  Notch degenerative changes of the anterior cruciate ligament but anterior cruciate ligament and PCL intact   The MEDIAL  meniscus was resected using a duckbill forceps. The meniscal fragments were removed with a motorized  shaver. The meniscus was balanced with a combination of a motorized shaver and a 50 wand until a stable rim was obtained.  The free edge tearing of the lateral meniscus was resected with a synovial shaver  A chondroplasty was performed of the medial femoral condyle with a shaver  A chondroplasty was performed of the patella using the 90 wand  The arthroscopic pump was placed on the wash mode and any excess debris was removed from the joint using suction.  60 cc of Marcaine with epinephrine was injected through the arthroscope.  The portals were closed with 3-0 nylon suture.  A sterile bandage, Ace wrap and Cryo/Cuff was placed and the Cryo/Cuff was activated. The patient was taken to the recovery room in stable condition.

## 2016-12-23 NOTE — Brief Op Note (Signed)
12/23/2016  8:31 AM  PATIENT:  [name]  51 y.o. female  PRE-OPERATIVE DIAGNOSIS:  LEFT MEDIAL MENISCUS TEAR  POST-OPERATIVE DIAGNOSIS:  LEFT MEDIAL MENISCUS TEAR, LEFT LATERAL MENISCUS TEAR  PROCEDURE:  Procedure(s): LEFT KNEE ARTHROSCOPY WITH MEDIAL MENISECTOMY CHONDROPLASTY PATELLA  AND MEDIAL FEMORAL CONDYLE LEFT KNEE (Left) LEFT KNEE ARTHROSCOPY WITH LATERAL MENISECTOMY (Left)  Findings at surgery  Torn medial meniscus posterior horn complex tear Grade 2 diffuse chondral arthritis femoral condyle  Anterior cruciate ligament and PCL were intact with degenerative fraying of the anterior cruciate ligament  Free edge tear of the lateral meniscus anterior horn midbody and posterior horn Grade 2 chondral lesion of the tibial plateau of the lateral compartment  Grade 4 chondral lesion trochlea Grade 2 chondral lesion medial facet of patella and lateral facet of patella  Moderate synovitis throughout the joint  Cpt 29880   SURGEON:  Surgeon(s) and Role:    * Carole Civil, MD - Primary  PHYSICIAN ASSISTANT:   ASSISTANTS: none   ANESTHESIA:   general  EBL:  Total I/O In: 600 [I.V.:600] Out: 10 [Blood:10]  BLOOD ADMINISTERED:none  DRAINS: none   LOCAL MEDICATIONS USED:  60 cc marcaine with epinephrine  SPECIMEN:  No Specimen  DISPOSITION OF SPECIMEN:  N/A  COUNTS:  YES  TOURNIQUET:    DICTATION: .Dragon Dictation  PLAN OF CARE: Discharge to home after PACU  PATIENT DISPOSITION:  PACU - hemodynamically stable.   Delay start of Pharmacological VTE agent (>24hrs) due to surgical blood loss or risk of bleeding: not applicable  [id number]

## 2016-12-23 NOTE — Interval H&P Note (Signed)
History and Physical Interval Note:  12/23/2016 7:18 AM  [name]  has presented today for surgery, with the diagnosis of LEFT MEDIAL MENISCUS TEAR  The various methods of treatment have been discussed with the patient and family. After consideration of risks, benefits and other options for treatment, the patient has consented to  Procedure(s): KNEE ARTHROSCOPY WITH MEDIAL MENISECTOMY (Left) as a surgical intervention .  The patient's history has been reviewed, patient examined, no change in status, stable for surgery.  I have reviewed the patient's chart and labs.  Questions were answered to the patient's satisfaction.     Arther Abbott

## 2016-12-23 NOTE — Anesthesia Preprocedure Evaluation (Signed)
Anesthesia Evaluation  Patient identified by MRN, date of birth, ID band Patient awake    Reviewed: Allergy & Precautions, H&P , NPO status , Patient's Chart, lab work & pertinent test results, reviewed documented beta blocker date and time   Airway Mallampati: II  TM Distance: >3 FB Neck ROM: full    Dental  (+) Teeth Intact   Pulmonary shortness of breath and with exertion, asthma , sleep apnea ,    breath sounds clear to auscultation       Cardiovascular hypertension, On Medications negative cardio ROS   Rhythm:regular     Neuro/Psych  Neuromuscular disease negative psych ROS   GI/Hepatic Neg liver ROS, hiatal hernia, neg GERD (no longer active)  ,  Endo/Other  negative endocrine ROS  Renal/GU negative Renal ROS  negative genitourinary   Musculoskeletal   Abdominal   Peds  Hematology negative hematology ROS (+)   Anesthesia Other Findings See surgeon's H&P   Reproductive/Obstetrics negative OB ROS                             Anesthesia Physical Anesthesia Plan  ASA: III  Anesthesia Plan: General   Post-op Pain Management:    Induction: Intravenous  Airway Management Planned: LMA  Additional Equipment:   Intra-op Plan:   Post-operative Plan: Extubation in OR  Informed Consent: I have reviewed the patients History and Physical, chart, labs and discussed the procedure including the risks, benefits and alternatives for the proposed anesthesia with the patient or authorized representative who has indicated his/her understanding and acceptance.     Plan Discussed with:   Anesthesia Plan Comments:         Anesthesia Quick Evaluation

## 2016-12-23 NOTE — Anesthesia Postprocedure Evaluation (Signed)
Anesthesia Post Note  Patient: [name]  Procedure(s) Performed: Procedure(s) (LRB): LEFT KNEE ARTHROSCOPY WITH MEDIAL MENISECTOMY CHONDROPLASTY PATELLA  AND MEDIAL FEMORAL CONDYLE LEFT KNEE (Left) LEFT KNEE ARTHROSCOPY WITH LATERAL MENISECTOMY (Left)  Patient location during evaluation: PACU Anesthesia Type: General Level of consciousness: awake and alert and oriented Pain management: pain level controlled Vital Signs Assessment: post-procedure vital signs reviewed and stable Respiratory status: spontaneous breathing and respiratory function stable Cardiovascular status: blood pressure returned to baseline Postop Assessment: no signs of nausea or vomiting Anesthetic complications: no     Last Vitals:  Vitals:   12/23/16 0945 12/23/16 1019  BP: 114/78 120/74  Pulse: 64 74  Resp: 12 18  Temp:  36.5 C    Last Pain:  Vitals:   12/23/16 1019  TempSrc: Oral  PainSc: 3                  Anselma Herbel

## 2016-12-23 NOTE — Anesthesia Procedure Notes (Signed)
Procedure Name: LMA Insertion Date/Time: 12/23/2016 7:36 AM Performed by: Tressie Stalker E Pre-anesthesia Checklist: Patient identified, Patient being monitored, Emergency Drugs available, Timeout performed and Suction available Patient Re-evaluated:Patient Re-evaluated prior to inductionOxygen Delivery Method: Circle System Utilized Preoxygenation: Pre-oxygenation with 100% oxygen Intubation Type: IV induction Ventilation: Mask ventilation without difficulty LMA: LMA inserted LMA Size: 4.0 Number of attempts: 1 Placement Confirmation: positive ETCO2 and breath sounds checked- equal and bilateral

## 2016-12-23 NOTE — Transfer of Care (Signed)
Immediate Anesthesia Transfer of Care Note  Patient: Ferne Reus  Procedure(s) Performed: Procedure(s): LEFT KNEE ARTHROSCOPY WITH MEDIAL MENISECTOMY CHONDROPLASTY PATELLA  AND MEDIAL FEMORAL CONDYLE LEFT KNEE (Left) LEFT KNEE ARTHROSCOPY WITH LATERAL MENISECTOMY (Left)  Patient Location: PACU  Anesthesia Type:General  Level of Consciousness: awake  Airway & Oxygen Therapy: Patient Spontanous Breathing and Patient connected to face mask oxygen  Post-op Assessment: Report given to RN  Post vital signs: Reviewed and stable  Last Vitals:  Vitals:   12/23/16 0700 12/23/16 0715  BP: 127/87 113/79  Resp: 14 17    Last Pain:  Vitals:   12/23/16 0629  TempSrc: Oral  PainSc: 5       Patients Stated Pain Goal: 8 (71/85/50 1586)  Complications: No apparent anesthesia complications

## 2016-12-24 ENCOUNTER — Other Ambulatory Visit (INDEPENDENT_AMBULATORY_CARE_PROVIDER_SITE_OTHER): Payer: Self-pay | Admitting: Internal Medicine

## 2016-12-24 NOTE — Telephone Encounter (Signed)
Patient will need an appointment prior to any further refills, she has not been seen in our office since 07/30/2015. Or, the patient may get from PCP.

## 2016-12-25 ENCOUNTER — Telehealth: Payer: Self-pay | Admitting: Orthopedic Surgery

## 2016-12-25 ENCOUNTER — Encounter (HOSPITAL_COMMUNITY): Payer: Self-pay | Admitting: Orthopedic Surgery

## 2016-12-25 NOTE — Telephone Encounter (Signed)
[name] called this morning and stated that her pain is not under control since the surgery.  She states that her "leg feels like dead weight."  She is taking her pain meds every 4 hours with not much relief at all.  Please advise.

## 2016-12-25 NOTE — Telephone Encounter (Signed)
PATIENT AWARE

## 2016-12-25 NOTE — Telephone Encounter (Signed)
Start ibuprofen 800 mg 3 x a day

## 2016-12-25 NOTE — Telephone Encounter (Signed)
Routing to Dr. Harrison to advise 

## 2016-12-28 ENCOUNTER — Encounter: Payer: Self-pay | Admitting: Orthopedic Surgery

## 2016-12-28 ENCOUNTER — Ambulatory Visit (INDEPENDENT_AMBULATORY_CARE_PROVIDER_SITE_OTHER): Payer: 59 | Admitting: Orthopedic Surgery

## 2016-12-28 DIAGNOSIS — Z4889 Encounter for other specified surgical aftercare: Secondary | ICD-10-CM

## 2016-12-28 DIAGNOSIS — Z9889 Other specified postprocedural states: Secondary | ICD-10-CM

## 2016-12-28 MED ORDER — IBUPROFEN 800 MG PO TABS: 800.0000 mg | ORAL_TABLET | Freq: Three times a day (TID) | ORAL | 0 refills | Status: DC

## 2016-12-28 NOTE — Progress Notes (Signed)
Postop visit #1 status post knee arthroscopy  Chief Complaint  Patient presents with  . Follow-up    Post op recheck of left knee, DOS 12-23-16.     Operative report   Preop diagnosis torn medial meniscus left knee Postop diagnosis torn medial meniscus left knee, torn lateral meniscus, arthritis and synovitis Procedure arthroscopy left knee partial medial meniscectomy, partial lateral meniscectomy, chondroplasty medial femoral condyle and patella and synovial resection partial   Surgeon Aline Brochure The operative findings are    Medial: complex tear posterior horn medial meniscus,  chondral arthritis medial femoral condyle grade 2 Lateral :arthritis lateral compartment with torn free edges of the lateral meniscus  Patellofemoral grade 4 chondral lesion trochlea diffuse grade 2 chondral lesion medial and lateral facet  Notch degenerative changes of the anterior cruciate ligament but anterior cruciate ligament and PCL intact   Problems issues concerns: none    No outpatient prescriptions have been marked as taking for the 12/28/16 encounter (Office Visit) with Carole Civil, MD.    Current Outpatient Prescriptions:  .  albuterol (PROVENTIL) (2.5 MG/3ML) 0.083% nebulizer solution, Take 2.5 mg by nebulization every 6 (six) hours as needed. For shortness of breath, Disp: , Rfl:  .  albuterol (VENTOLIN HFA) 108 (90 Base) MCG/ACT inhaler, Inhale 1-2 puffs into the lungs every 6 (six) hours as needed for wheezing or shortness of breath., Disp: , Rfl:  .  amLODipine (NORVASC) 5 MG tablet, Take 5 mg by mouth at bedtime., Disp: , Rfl:  .  cyclobenzaprine (FLEXERIL) 10 MG tablet, Take 1 tablet (10 mg total) by mouth 3 (three) times daily as needed. For muscle spasms, Disp: 40 tablet, Rfl: 2 .  dicyclomine (BENTYL) 10 MG capsule, Take 1 capsule (10 mg total) by mouth 2 (two) times daily before a meal. (Patient taking differently: Take 10 mg by mouth 2 (two) times daily as needed (abdominal  pain/IBS). ), Disp: 60 capsule, Rfl: 3 .  diphenhydrAMINE (BENADRYL) 25 MG tablet, Take 1 tablet (25 mg total) by mouth every 6 (six) hours. (Patient taking differently: Take 25 mg by mouth every 6 (six) hours as needed (for allergic reactions). ), Disp: 20 tablet, Rfl: 0 .  EPINEPHrine (EPIPEN) 0.3 mg/0.3 mL SOAJ injection, Inject 0.3 mLs (0.3 mg total) into the muscle once. (Patient taking differently: Inject 0.3 mg into the muscle daily as needed (for anaphylatic reaction). ), Disp: 1 Device, Rfl: 0 .  HYDROcodone-acetaminophen (NORCO/VICODIN) 5-325 MG tablet, Take 1 tablet by mouth every 4 (four) hours as needed for moderate pain., Disp: 30 tablet, Rfl: 0 .  ibuprofen (ADVIL,MOTRIN) 800 MG tablet, Take 1 tablet (800 mg total) by mouth 3 (three) times daily., Disp: 90 tablet, Rfl: 0 .  losartan (COZAAR) 100 MG tablet, Take 100 mg by mouth at bedtime. , Disp: , Rfl:  .  meclizine (ANTIVERT) 25 MG tablet, Take 25 mg by mouth 3 (three) times daily as needed for dizziness., Disp: , Rfl:  .  meloxicam (MOBIC) 7.5 MG tablet, Take 7.5 mg by mouth 2 (two) times daily as needed. For pain/inflammation., Disp: , Rfl:  .  ondansetron (ZOFRAN) 4 MG tablet, Take 1 tablet (4 mg total) by mouth every 8 (eight) hours as needed for nausea or vomiting., Disp: 30 tablet, Rfl: 1 .  pantoprazole (PROTONIX) 40 MG tablet, TAKE (1) TABLET BY MOUTH TWICE DAILY BEFORE A MEAL., Disp: 60 tablet, Rfl: 1 .  TURMERIC PO, Take 2 capsules by mouth daily., Disp: , Rfl:   The  portal sites are clean dry and intact, the sutures were removed.  The calf was supple soft and the Homans sign was negative.  The patient is regaining knee flexion  Physical therapy will be started   Status post knee arthroscopy  Follow-up in  2 weeks to start hyaluronic acid injection right knee  3 weeks of therapy.  Meds ordered this encounter  Medications  . ibuprofen (ADVIL,MOTRIN) 800 MG tablet    Sig: Take 1 tablet (800 mg total) by mouth 3  (three) times daily.    Dispense:  90 tablet    Refill:  0

## 2017-01-05 ENCOUNTER — Ambulatory Visit (HOSPITAL_COMMUNITY): Payer: 59 | Attending: Orthopedic Surgery | Admitting: Physical Therapy

## 2017-01-05 ENCOUNTER — Encounter (HOSPITAL_COMMUNITY): Payer: Self-pay | Admitting: Physical Therapy

## 2017-01-05 DIAGNOSIS — M6281 Muscle weakness (generalized): Secondary | ICD-10-CM | POA: Insufficient documentation

## 2017-01-05 DIAGNOSIS — M25662 Stiffness of left knee, not elsewhere classified: Secondary | ICD-10-CM | POA: Diagnosis not present

## 2017-01-05 DIAGNOSIS — M25562 Pain in left knee: Secondary | ICD-10-CM | POA: Diagnosis not present

## 2017-01-05 DIAGNOSIS — R2689 Other abnormalities of gait and mobility: Secondary | ICD-10-CM | POA: Insufficient documentation

## 2017-01-05 NOTE — Therapy (Signed)
[facility] [address] [place], [place], [address] Phone: [phone]   Fax:  [phone]  Physical Therapy Evaluation  Patient Details  Name: [name] MRN: [id number] Date of Birth: [birth year] Referring Provider: Arther Abbott, MD  Encounter Date: 01/05/2017      PT End of Session - 01/05/17 1244    Visit Number 1   Number of Visits 9   Date for PT Re-Evaluation 02/02/17   Authorization Type [employer]   Authorization Time Period 01/05/17 to 02/04/17   PT Start Time 1115   PT Stop Time 1157   PT Time Calculation (min) 42 min   Activity Tolerance Patient tolerated treatment well;No increased pain   Behavior During Therapy WFL for tasks assessed/performed      Past Medical History:  Diagnosis Date  . Arthritis   . Asthma   . Chest pain    cath 2005 norm cors, repeat cath Feb 2011 normal cors and only minimally  elevated pulmonary pressures  . Crohn disease ([place])   . Diverticulosis   . GERD (gastroesophageal reflux disease)   . Hiatal hernia   . Hypertension   . Obesity   . Palpitations   . Sleep apnea sleep study 11/03/2009   mild-no cpap; does sometimes, doesnt use every night.    Past Surgical History:  Procedure Laterality Date  . ABDOMINAL HYSTERECTOMY    . ABDOMINAL HYSTERECTOMY    . ANKLE ARTHROSCOPY  06/01/2012   Procedure: ANKLE ARTHROSCOPY;  Surgeon: Colin Rhein, MD;  Location: [facility];  Service: Orthopedics;  Laterality: Left;  left ankle arthorsocpy with extensive debridement and gastroc slide  . CARDIAC CATHETERIZATION  11/22/2009 and 2005   WNL  . CESAREAN SECTION    . CHOLECYSTECTOMY  09/08/2006   lap. chole.  . CHONDROPLASTY  06/17/2012   Procedure: CHONDROPLASTY;  Surgeon: Carole Civil, MD;  [facility];  Service: Orthopedics;  Laterality: Right;  right patella  . ESOPHAGOGASTRODUODENOSCOPY N/A 05/14/2015   Procedure: ESOPHAGOGASTRODUODENOSCOPY (EGD);   Surgeon: Rogene Houston, MD;  [facility];  Service: Endoscopy;  Laterality: N/A;  730  . INGUINAL HERNIA REPAIR  10/30/2008   right  . KNEE ARTHROSCOPY WITH LATERAL MENISECTOMY Left 12/23/2016   Procedure: LEFT KNEE ARTHROSCOPY WITH LATERAL MENISECTOMY;  Surgeon: Carole Civil, MD;  [facility];  Service: Orthopedics;  Laterality: Left;  . KNEE ARTHROSCOPY WITH MEDIAL MENISECTOMY Left 12/23/2016   Procedure: LEFT KNEE ARTHROSCOPY WITH MEDIAL MENISECTOMY CHONDROPLASTY PATELLA  AND MEDIAL FEMORAL CONDYLE LEFT KNEE;  Surgeon: Carole Civil, MD;  [facility];  Service: Orthopedics;  Laterality: Left;  . SHOULDER SURGERY     right x 2     There were no vitals filed for this visit.       Subjective Assessment - 01/05/17 1121    Subjective Pt reports that her Lt knee popped one day and she noticed alot of swelling. She had torn both of her her menisci. She underwent Lt knee scope on 12/23/16. She has had no issues since the surgery. She has been trying to keep her knee moving as much as possible.    Pertinent History Arthritis, HTN, GERD, Lt ankle scope 2013, Rt knee scope 2013, Lt knee scope 12/23/16   Limitations Walking   How long can you sit comfortably? only stiffness when she goes to move after sitting for a while.    Patient Stated Goals improve endurance  and strength    Currently in Pain? No/denies  4 or 5/10 max             [facility] PT Assessment - 01/05/17 0001      Assessment   Medical Diagnosis Lt knee scope    Referring Provider Arther Abbott, MD   Onset Date/Surgical Date 12/23/16   Next MD Visit 01/11/17  Will also be starting Rt knee injections    Prior Therapy none      Precautions   Precautions None     Balance Screen   Has the patient fallen in the past 6 months No   Has the patient had a decrease in activity level because of a fear of falling?  No   Is the patient reluctant to leave their home because of a fear of falling?  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      Cognition   Overall Cognitive Status Within Functional Limits for tasks assessed     Observation/Other Assessments   Observations surgical incision healing well and intact     ROM / Strength   AROM / PROM / Strength AROM;Strength     AROM   AROM Assessment Site Knee   Right/Left Knee Right;Left   Right Knee Extension 0   Right Knee Flexion 90   Left Knee Extension 0   Left Knee Flexion 90     Strength   Strength Assessment Site Hip;Knee;Ankle   Right/Left Hip Right;Left   Right Hip Flexion 4+/5   Right Hip Extension 4/5   Right Hip ABduction 4/5   Left Hip Flexion 3+/5   Left Hip Extension 3+/5   Left Hip ABduction 4/5   Right/Left Knee Right;Left   Right Knee Flexion 5/5   Right Knee Extension 5/5   Left Knee Flexion 4+/5   Left Knee Extension 4/5   Right/Left Ankle Right;Left   Right Ankle Dorsiflexion 5/5   Left Ankle Dorsiflexion 5/5     Transfers   Five time sit to stand comments  22.1 sec, UE on thighs      Ambulation/Gait   Gait Comments decreased weight shift to the Lt, using Rt axillary crutch      Standardized Balance Assessment   Standardized Balance Assessment Timed Up and Go Test     Timed Up and Go Test   TUG Comments 12.1 sec, UE support on chair.      High Level Balance   High Level Balance Comments SLS: Rt 10 sec;  Lt 9 sec                   [facility] Adult PT Treatment/Exercise - 01/05/17 0001      Exercises   Exercises Knee/Hip     Knee/Hip Exercises: Seated   Heel Slides Left;1 set;5 reps   Heel Slides Limitations HEP demo     Knee/Hip Exercises: Prone   Hamstring Curl 1 set;5 reps                PT Education - 01/05/17 1241    Education  provided Yes   Education Details eval findings/POC; importance of easing back into daily activity; HEP implemented and reviewed.  Person(s) Educated Patient   Methods Explanation;Handout;Demonstration;Verbal cues   Comprehension Verbalized understanding;Returned demonstration          PT Short Term Goals - 01/05/17 1256      PT SHORT TERM GOAL #1   Title Pt will demo understanding and consistency with her HEP to improve knee ROM and strength.    Time 2   Period Weeks   Status New           PT Long Term Goals - 01/05/17 1257      PT LONG TERM GOAL #1   Title Pt will demo improved BLE strength to 5/5 MMT which will increase her safety with functional activity.    Time 4   Period Weeks   Status New     PT LONG TERM GOAL #2   Title Pt will complete 5x sit to stand in less than 13 sec, without UE support, to represent an improvement in her functional LE strength and power.    Time 4   Period Weeks   Status New     PT LONG TERM GOAL #3   Title Pt will demo Lt knee flexion ROM to atleast 100 deg, which will improve her ability to complete sit to stand transitions without significant difficulty.    Time 4   Period Weeks   Status New     PT LONG TERM GOAL #4   Title Pt will maintain SLS on each LE for atleast 20 sec without LOB, 2/3 trials, which will improve her safety with single leg support at home.    Time 4   Period Weeks   Status New     PT LONG TERM GOAL #5   Title Pt will report improved walking tolerance up to atleast 45 minutes consecutively on her feet without pain, which will prepare her for return to work.    Time 4   Period Weeks   Status New               Plan - 01/05/17 1245    Clinical Impression Statement Pt is a pleasant 51yo F referred to OPPT s/p Lt medial/lateral meniscectomy on 12/23/16. She presents to her evaluation using 1 axillary crutch, demonstrating altered gait mechanics and with post-surgical pain and swelling. She also presents  with BLE weakness, Lt worse than the Rt, and limited knee flexion ROM which is impairing her proprioception and performance on functional testing such as sit to stand and the TUG. She would benefit from skilled PT to address her limitations listed above and facilitate full return to work in a few weeks. Eval findings and HEP were discussed with the pt who verbalized understanding and agreement with proposed PT POC/frequency.    Rehab Potential Good   PT Frequency 2x / week   PT Duration 4 weeks   PT Treatment/Interventions ADLs/Self Care Home Management;Cryotherapy;Gait training;Stair training;Functional mobility training;Therapeutic activities;Therapeutic exercise;Neuromuscular re-education;Balance training;Manual techniques;Dry needling;Passive range of motion;Scar mobilization;Patient/family education   PT Next Visit Plan Focus on improving knee flexion ROM, hip strengthening, gait training without AD   PT Home Exercise Plan seated knee flexion AROM x10 reps, prone knee flexion AROM x10 reps, BLE bridge x10 reps    Recommended Other Services none    Consulted and Agree with Plan of Care Patient      Patient will benefit from skilled therapeutic intervention in order to improve the following deficits and impairments:  Abnormal gait, Decreased activity tolerance, Decreased balance, Difficulty walking, Decreased safety awareness,  Impaired flexibility, Decreased strength, Decreased range of motion, Decreased endurance, Increased muscle spasms, Postural dysfunction, Pain, Improper body mechanics  Visit Diagnosis: Acute pain of left knee  Stiffness of left knee, not elsewhere classified  Other abnormalities of gait and mobility  Muscle weakness (generalized)     Problem List Patient Active Problem List   Diagnosis Date Noted  . Derangement of anterior horn of lateral meniscus of left knee   . Derangement of posterior horn of medial meniscus of left knee   . Chest pain 04/25/2016  .  Hypokalemia 04/25/2016  . Hyperglycemia 04/25/2016  . Diverticulitis of colon 07/17/2014  . Nausea without vomiting 07/17/2014  . Crohn disease ([place]) 07/17/2014  . Diverticulitis of colon without hemorrhage 08/10/2013  . History of arthroscopy of right knee 07/25/2012  . Difficulty in walking(719.7) 06/29/2012  . Weakness of right leg 06/29/2012  . Posterior tibial tendonitis 11/11/2011  . PTTD (posterior tibial tendon dysfunction) 11/11/2011  . Knee pain, right 11/11/2011  . Chest pain 07/16/2011  . Primary osteoarthritis of left knee 02/25/2010  . PAIN IN JOINT, MULTIPLE SITES 02/25/2010  . Essential hypertension 08/13/2009  . HIATAL HERNIA 08/13/2009  . PALPITATIONS 08/13/2009  . DYSPNEA 08/13/2009    1:49 PM,01/05/17 Elly Modena PT, DPT [facility] [phone]   Elly Modena 01/05/2017, 1:48 PM  [facility] [address] [place], [place], [address] Phone: [phone]   Fax:  [phone]  Name: [name] MRN: [id number] Date of Birth: 1966-03-03

## 2017-01-07 ENCOUNTER — Ambulatory Visit (HOSPITAL_COMMUNITY): Payer: 59

## 2017-01-07 DIAGNOSIS — R2689 Other abnormalities of gait and mobility: Secondary | ICD-10-CM | POA: Diagnosis not present

## 2017-01-07 DIAGNOSIS — M6281 Muscle weakness (generalized): Secondary | ICD-10-CM | POA: Diagnosis not present

## 2017-01-07 DIAGNOSIS — M25562 Pain in left knee: Secondary | ICD-10-CM | POA: Diagnosis not present

## 2017-01-07 DIAGNOSIS — M25662 Stiffness of left knee, not elsewhere classified: Secondary | ICD-10-CM

## 2017-01-07 NOTE — Therapy (Signed)
[facility] [address] [place], [place], [address] Phone: [phone]   Fax:  [phone]  Physical Therapy Treatment  Patient Details  Name: [name] MRN: [id number] Date of Birth: 1966-02-23 Referring Provider: Arther Abbott, MD  Encounter Date: 01/07/2017      PT End of Session - 01/07/17 1616    Visit Number 2   Number of Visits 9   Date for PT Re-Evaluation 02/02/17   Authorization Type [employer]   Authorization Time Period 01/05/17 to 02/04/17   PT Start Time 1610  Pt late for apt   PT Stop Time 1649   PT Time Calculation (min) 39 min   Activity Tolerance Patient tolerated treatment well;No increased pain   Behavior During Therapy WFL for tasks assessed/performed      Past Medical History:  Diagnosis Date  . Arthritis   . Asthma   . Chest pain    cath 2005 norm cors, repeat cath Feb 2011 normal cors and only minimally  elevated pulmonary pressures  . Crohn disease ([place])   . Diverticulosis   . GERD (gastroesophageal reflux disease)   . Hiatal hernia   . Hypertension   . Obesity   . Palpitations   . Sleep apnea sleep study 11/03/2009   mild-no cpap; does sometimes, doesnt use every night.    Past Surgical History:  Procedure Laterality Date  . ABDOMINAL HYSTERECTOMY    . ABDOMINAL HYSTERECTOMY    . ANKLE ARTHROSCOPY  06/01/2012   Procedure: ANKLE ARTHROSCOPY;  Surgeon: Colin Rhein, MD;  Location: [facility];  Service: Orthopedics;  Laterality: Left;  left ankle arthorsocpy with extensive debridement and gastroc slide  . CARDIAC CATHETERIZATION  11/22/2009 and 2005   WNL  . CESAREAN SECTION    . CHOLECYSTECTOMY  09/08/2006   lap. chole.  . CHONDROPLASTY  06/17/2012   Procedure: CHONDROPLASTY;  Surgeon: Carole Civil, MD;  [facility];  Service: Orthopedics;  Laterality: Right;  right patella  . ESOPHAGOGASTRODUODENOSCOPY N/A 05/14/2015   Procedure:  ESOPHAGOGASTRODUODENOSCOPY (EGD);  Surgeon: Rogene Houston, MD;  [facility];  Service: Endoscopy;  Laterality: N/A;  730  . INGUINAL HERNIA REPAIR  10/30/2008   right  . KNEE ARTHROSCOPY WITH LATERAL MENISECTOMY Left 12/23/2016   Procedure: LEFT KNEE ARTHROSCOPY WITH LATERAL MENISECTOMY;  Surgeon: Carole Civil, MD;  [facility];  Service: Orthopedics;  Laterality: Left;  . KNEE ARTHROSCOPY WITH MEDIAL MENISECTOMY Left 12/23/2016   Procedure: LEFT KNEE ARTHROSCOPY WITH MEDIAL MENISECTOMY CHONDROPLASTY PATELLA  AND MEDIAL FEMORAL CONDYLE LEFT KNEE;  Surgeon: Carole Civil, MD;  Location: [facility];  Service: Orthopedics;  Laterality: Left;  . SHOULDER SURGERY     right x 2     There were no vitals filed for this visit.      Subjective Assessment - 01/07/17 1612    Subjective Pt reports she was feeling good until yesterday her dog caused a LOB episodes (no reports of fall) with increased swelling, pain and antalgic gait mechanics noted at entrance.  Current pain scale 4/10 Lt knee soreness and swelling.     Pertinent History Arthritis, HTN, GERD, Lt ankle scope 2013, Rt knee scope 2013, Lt knee scope 12/23/16   Patient Stated Goals improve endurance and strength    Currently in Pain? Yes   Pain Score 4    Pain Location Knee   Pain Orientation Left   Pain Descriptors / Indicators Tightness;Sore  Pain Type Surgical pain   Pain Onset 1 to 4 weeks ago   Pain Frequency Intermittent   Aggravating Factors  weight bearing for long periods, increased reps with some exercises   Pain Relieving Factors elevation and ice                         [facility] Adult PT Treatment/Exercise - 01/07/17 0001      Knee/Hip Exercises: Stretches   Sports administrator 3 reps;30 seconds   Quad Stretch Limitations prone with rope   Knee: Self-Stretch to increase Flexion 5 reps;10 seconds     Knee/Hip Exercises: Standing   Gait Training Heel to toe pattern and cueing for equal  stance phase with 1 crutch; pt encouraged to wear proper shoeware not flipflops     Knee/Hip Exercises: Supine   Heel Slides 2 sets;10 reps   Heel Slides Limitations 0-96 degrees     Knee/Hip Exercises: Prone   Hamstring Curl 10 reps     Manual Therapy   Manual Therapy Edema management   Manual therapy comments Manual complete separately than rest of tx   Edema Management Retrograde massage wtih LE elevated                 PT Education - 01/07/17 1702    Education provided Yes   Education Details Reviewed goals, educated importance of compliance wtih HEP, copy of eval given to pt.  Gait training for proper heel to toe, encouraged to wear proper shoes.  Benefits of ice for pain and edema control.   Person(s) Educated Patient   Methods Explanation;Demonstration;Handout   Comprehension Verbalized understanding;Returned demonstration          PT Short Term Goals - 01/05/17 1256      PT SHORT TERM GOAL #1   Title Pt will demo understanding and consistency with her HEP to improve knee ROM and strength.    Time 2   Period Weeks   Status New           PT Long Term Goals - 01/05/17 1257      PT LONG TERM GOAL #1   Title Pt will demo improved BLE strength to 5/5 MMT which will increase her safety with functional activity.    Time 4   Period Weeks   Status New     PT LONG TERM GOAL #2   Title Pt will complete 5x sit to stand in less than 10 sec, without UE support, to represent an improvement in her functional LE strength and power.    Time 4   Period Weeks   Status New     PT LONG TERM GOAL #3   Title Pt will demo Lt knee flexion ROM to atleast 100 deg, which will improve her ability to complete sit to stand transitions without significant difficulty.    Time 4   Period Weeks   Status New     PT LONG TERM GOAL #4   Title Pt will maintain SLS on each LE for atleast 20 sec without LOB, 2/3 trials, which will improve her safety with single leg support at home.     Time 4   Period Weeks   Status New     PT LONG TERM GOAL #5   Title Pt will report improved walking tolerance up to atleast 45 minutes consecutively on her feet without pain, which will prepare her for return to work.    Time 4  Period Weeks   Status New               Plan - 01/07/17 1628    Clinical Impression Statement Reviewed goals and importance of compliance iwth HEP and copy of eval given to pt.  Began session with manual retro massage for edema and pain control to assist with knee mobilty for ROM therex.  Therex focus on improving knee flexion and proximal strengthening with AROM and stretches.  Improved knee flexion to 96 degrees at EOS.  Pt encouraged to apply ice for pain and edema control.     Rehab Potential Good   PT Frequency 2x / week   PT Duration 4 weeks   PT Treatment/Interventions ADLs/Self Care Home Management;Cryotherapy;Gait training;Stair training;Functional mobility training;Therapeutic activities;Therapeutic exercise;Neuromuscular re-education;Balance training;Manual techniques;Dry needling;Passive range of motion;Scar mobilization;Patient/family education   PT Next Visit Plan Focus on improving knee flexion ROM, hip strengthening, gait training without AD   PT Home Exercise Plan seated knee flexion AROM x10 reps, prone knee flexion AROM x10 reps, BLE bridge x10 reps       Patient will benefit from skilled therapeutic intervention in order to improve the following deficits and impairments:  Abnormal gait, Decreased activity tolerance, Decreased balance, Difficulty walking, Decreased safety awareness, Impaired flexibility, Decreased strength, Decreased range of motion, Decreased endurance, Increased muscle spasms, Postural dysfunction, Pain, Improper body mechanics  Visit Diagnosis: Acute pain of left knee  Stiffness of left knee, not elsewhere classified  Other abnormalities of gait and mobility  Muscle weakness (generalized)     Problem  List Patient Active Problem List   Diagnosis Date Noted  . Derangement of anterior horn of lateral meniscus of left knee   . Derangement of posterior horn of medial meniscus of left knee   . Chest pain 04/25/2016  . Hypokalemia 04/25/2016  . Hyperglycemia 04/25/2016  . Diverticulitis of colon 07/17/2014  . Nausea without vomiting 07/17/2014  . Crohn disease ([place]) 07/17/2014  . Diverticulitis of colon without hemorrhage 08/10/2013  . History of arthroscopy of right knee 07/25/2012  . Difficulty in walking(719.7) 06/29/2012  . Weakness of right leg 06/29/2012  . Posterior tibial tendonitis 11/11/2011  . PTTD (posterior tibial tendon dysfunction) 11/11/2011  . Knee pain, right 11/11/2011  . Chest pain 07/16/2011  . Primary osteoarthritis of left knee 02/25/2010  . PAIN IN JOINT, MULTIPLE SITES 02/25/2010  . Essential hypertension 08/13/2009  . HIATAL HERNIA 08/13/2009  . PALPITATIONS 08/13/2009  . DYSPNEA 08/13/2009   Ihor [place], [place]; [place]  Aldona Lento 01/07/2017, 6:03 PM  [facility] [place], [place], [address] Phone: [phone]   Fax:  [phone]  Name: TERESIA MYINT MRN: [id number] Date of Birth: [birth year]

## 2017-01-11 ENCOUNTER — Ambulatory Visit (INDEPENDENT_AMBULATORY_CARE_PROVIDER_SITE_OTHER): Payer: 59 | Admitting: Orthopedic Surgery

## 2017-01-11 ENCOUNTER — Encounter: Payer: Self-pay | Admitting: Orthopedic Surgery

## 2017-01-11 DIAGNOSIS — M1711 Unilateral primary osteoarthritis, right knee: Secondary | ICD-10-CM | POA: Diagnosis not present

## 2017-01-11 DIAGNOSIS — Z9889 Other specified postprocedural states: Secondary | ICD-10-CM

## 2017-01-11 DIAGNOSIS — M62838 Other muscle spasm: Secondary | ICD-10-CM

## 2017-01-11 DIAGNOSIS — Z4889 Encounter for other specified surgical aftercare: Secondary | ICD-10-CM

## 2017-01-11 MED ORDER — CYCLOBENZAPRINE HCL 10 MG PO TABS: 10.0000 mg | ORAL_TABLET | Freq: Three times a day (TID) | ORAL | 2 refills | Status: DC | PRN

## 2017-01-11 NOTE — Progress Notes (Signed)
Chief Complaint  Patient presents with  . Follow-up    Post op recheck on left knee, SALK, DOS 12-23-16.    LEFT KNEE DOING WELL Portals are clean dry and intact. The ankle has no swelling. The patient can reach full extension with good quadriceps muscle tone  Continue therapy  She is also here today for her first right knee orthosis injection  Procedure note for injection of Orthovisc  Diagnosis osteoarthritis of the knee  Verbal consent was obtained to inject the knee with Orthovisc. Timeout was completed to confirm the injection site as the RIGHT   Knee  Ethyl chloride spray was used for anesthesia Alcohol was used to prep the skin. The infrapatellar lateral portal was used as an injection site and 1 vial of Orthovisc was injected into the knee   No complications were noted

## 2017-01-11 NOTE — Progress Notes (Signed)
Orthovisc injection Lot [id number], exp 05/04/18

## 2017-01-13 ENCOUNTER — Ambulatory Visit (HOSPITAL_COMMUNITY): Payer: 59 | Admitting: Physical Therapy

## 2017-01-13 DIAGNOSIS — M25562 Pain in left knee: Secondary | ICD-10-CM

## 2017-01-13 DIAGNOSIS — M6281 Muscle weakness (generalized): Secondary | ICD-10-CM

## 2017-01-13 DIAGNOSIS — R2689 Other abnormalities of gait and mobility: Secondary | ICD-10-CM

## 2017-01-13 DIAGNOSIS — M25662 Stiffness of left knee, not elsewhere classified: Secondary | ICD-10-CM

## 2017-01-13 NOTE — Therapy (Signed)
[facility] [address] [place], [place], [address] Phone: [phone]   Fax:  [phone]  Physical Therapy Treatment  Patient Details  Name: [name] MRN: [id number] Date of Birth: [birth year] Referring Provider: Arther Abbott, MD  Encounter Date: 01/13/2017      PT End of Session - 01/13/17 0810    Visit Number 3   Number of Visits 9   Date for PT Re-Evaluation 02/02/17   Authorization Type [employer]   Authorization Time Period 01/05/17 to 02/04/17   PT Start Time 0737   PT Stop Time 0815   PT Time Calculation (min) 38 min   Activity Tolerance Patient tolerated treatment well;No increased pain   Behavior During Therapy WFL for tasks assessed/performed      Past Medical History:  Diagnosis Date  . Arthritis   . Asthma   . Chest pain    cath 2005 norm cors, repeat cath Feb 2011 normal cors and only minimally  elevated pulmonary pressures  . Crohn disease ([place])   . Diverticulosis   . GERD (gastroesophageal reflux disease)   . Hiatal hernia   . Hypertension   . Obesity   . Palpitations   . Sleep apnea sleep study 11/03/2009   mild-no cpap; does sometimes, doesnt use every night.    Past Surgical History:  Procedure Laterality Date  . ABDOMINAL HYSTERECTOMY    . ABDOMINAL HYSTERECTOMY    . ANKLE ARTHROSCOPY  06/01/2012   Procedure: ANKLE ARTHROSCOPY;  Surgeon: Colin Rhein, MD;  Location: [facility];  Service: Orthopedics;  Laterality: Left;  left ankle arthorsocpy with extensive debridement and gastroc slide  . CARDIAC CATHETERIZATION  11/22/2009 and 2005   WNL  . CESAREAN SECTION    . CHOLECYSTECTOMY  09/08/2006   lap. chole.  . CHONDROPLASTY  06/17/2012   Procedure: CHONDROPLASTY;  Surgeon: Carole Civil, MD;  Location: [facility];  Service: Orthopedics;  Laterality: Right;  right patella  . ESOPHAGOGASTRODUODENOSCOPY N/A 05/14/2015   Procedure: ESOPHAGOGASTRODUODENOSCOPY (EGD);   Surgeon: Rogene Houston, MD;  [facility];  Service: Endoscopy;  Laterality: N/A;  730  . INGUINAL HERNIA REPAIR  10/30/2008   right  . KNEE ARTHROSCOPY WITH LATERAL MENISECTOMY Left 12/23/2016   Procedure: LEFT KNEE ARTHROSCOPY WITH LATERAL MENISECTOMY;  Surgeon: Carole Civil, MD;  Location: [facility];  Service: Orthopedics;  Laterality: Left;  . KNEE ARTHROSCOPY WITH MEDIAL MENISECTOMY Left 12/23/2016   Procedure: LEFT KNEE ARTHROSCOPY WITH MEDIAL MENISECTOMY CHONDROPLASTY PATELLA  AND MEDIAL FEMORAL CONDYLE LEFT KNEE;  Surgeon: Carole Civil, MD;  Location: [facility];  Service: Orthopedics;  Laterality: Left;  . SHOULDER SURGERY     right x 2     There were no vitals filed for this visit.      Subjective Assessment - 01/13/17 0737    Subjective Pt reports that things are going well. She feels like her knee needs to pop this morning. She has been doing her exercises without reported difficulty. No actual pain though.    Pertinent History Arthritis, HTN, GERD, Lt ankle scope 2013, Rt knee scope 2013, Lt knee scope 12/23/16   Patient Stated Goals improve endurance and strength    Currently in Pain? No/denies   Pain Onset 1 to 4 weeks ago                         [facility] Adult PT Treatment/Exercise -  01/13/17 0001      Ambulation/Gait   Gait Comments x532f with therapist providing cues to narrow BOS and increase RUE swing during gait.      Knee/Hip Exercises: Stretches   Other Knee/Hip Stretches Lt knee stretch on 12" box 15x5 sec hold      Knee/Hip Exercises: Standing   Knee Flexion Both;10 reps   Knee Flexion Limitations 5#, x2 sets on Lt    Forward Step Up 2 sets;Left;Hand Hold: 0;Step Height: 6"   Forward Step Up Limitations 2nd set with knee drive    SLS [id number] each with intermittent UE support    Other Standing Knee Exercises tandem hold with each LE forward 2x20 each on foam, 1x20 sec on firm     Knee/Hip Exercises: Supine   Heel Slides 2  sets;10 reps   Heel Slides Limitations 1st set AROM, second set AAROM                 PT Education - 01/13/17 0815    Education provided Yes   Education Details gait training and encouraged pt to work on this at home    Person(s) Educated Patient   Methods Explanation;Verbal cues   Comprehension Verbalized understanding;Returned demonstration          PT Short Term Goals - 01/05/17 1256      PT SHORT TERM GOAL #1   Title Pt will demo understanding and consistency with her HEP to improve knee ROM and strength.    Time 2   Period Weeks   Status New           PT Long Term Goals - 01/05/17 1257      PT LONG TERM GOAL #1   Title Pt will demo improved BLE strength to 5/5 MMT which will increase her safety with functional activity.    Time 4   Period Weeks   Status New     PT LONG TERM GOAL #2   Title Pt will complete 5x sit to stand in less than 10 sec, without UE support, to represent an improvement in her functional LE strength and power.    Time 4   Period Weeks   Status New     PT LONG TERM GOAL #3   Title Pt will demo Lt knee flexion ROM to atleast 100 deg, which will improve her ability to complete sit to stand transitions without significant difficulty.    Time 4   Period Weeks   Status New     PT LONG TERM GOAL #4   Title Pt will maintain SLS on each LE for atleast 20 sec without LOB, 2/3 trials, which will improve her safety with single leg support at home.    Time 4   Period Weeks   Status New     PT LONG TERM GOAL #5   Title Pt will report improved walking tolerance up to atleast 45 minutes consecutively on her feet without pain, which will prepare her for return to work.    Time 4   Period Weeks   Status New               Plan - 01/13/17 0820    Clinical Impression Statement Today's session focused on therex to improve knee ROM and strength. Pt was able to perform all increased reps and resistance without increase in pain. Ended  with static balance activity and she did require intermittent UE support to decrease her unsteadiness. Pt able to  ambulate out of the clinic without her crutch and with proper mechanics following the session.   Rehab Potential Good   PT Frequency 2x / week   PT Duration 4 weeks   PT Treatment/Interventions ADLs/Self Care Home Management;Cryotherapy;Gait training;Stair training;Functional mobility training;Therapeutic activities;Therapeutic exercise;Neuromuscular re-education;Balance training;Manual techniques;Dry needling;Passive range of motion;Scar mobilization;Patient/family education   PT Next Visit Plan knee flexion ROM, balance/proprioceptive activity   PT Home Exercise Plan seated knee flexion AROM x10 reps, prone knee flexion AROM x10 reps, BLE bridge x10 reps    Consulted and Agree with Plan of Care Patient      Patient will benefit from skilled therapeutic intervention in order to improve the following deficits and impairments:  Abnormal gait, Decreased activity tolerance, Decreased balance, Difficulty walking, Decreased safety awareness, Impaired flexibility, Decreased strength, Decreased range of motion, Decreased endurance, Increased muscle spasms, Postural dysfunction, Pain, Improper body mechanics  Visit Diagnosis: Acute pain of left knee  Stiffness of left knee, not elsewhere classified  Other abnormalities of gait and mobility  Muscle weakness (generalized)     Problem List Patient Active Problem List   Diagnosis Date Noted  . Derangement of anterior horn of lateral meniscus of left knee   . Derangement of posterior horn of medial meniscus of left knee   . Chest pain 04/25/2016  . Hypokalemia 04/25/2016  . Hyperglycemia 04/25/2016  . Diverticulitis of colon 07/17/2014  . Nausea without vomiting 07/17/2014  . Crohn disease ([place]) 07/17/2014  . Diverticulitis of colon without hemorrhage 08/10/2013  . History of arthroscopy of right knee 07/25/2012  . Difficulty in  walking(719.7) 06/29/2012  . Weakness of right leg 06/29/2012  . Posterior tibial tendonitis 11/11/2011  . PTTD (posterior tibial tendon dysfunction) 11/11/2011  . Knee pain, right 11/11/2011  . Chest pain 07/16/2011  . Primary osteoarthritis of left knee 02/25/2010  . PAIN IN JOINT, MULTIPLE SITES 02/25/2010  . Essential hypertension 08/13/2009  . HIATAL HERNIA 08/13/2009  . PALPITATIONS 08/13/2009  . DYSPNEA 08/13/2009   8:24 AM,01/13/17 Elly Modena PT, DPT [facility] [place] [address] [place], [place], [address] Phone: [phone]   Fax:  [phone]  Name: KALLIOPE RIESEN MRN: [id number] Date of Birth: 11-17-1965

## 2017-01-14 ENCOUNTER — Ambulatory Visit (HOSPITAL_COMMUNITY): Payer: 59

## 2017-01-14 DIAGNOSIS — R2689 Other abnormalities of gait and mobility: Secondary | ICD-10-CM | POA: Diagnosis not present

## 2017-01-14 DIAGNOSIS — M25662 Stiffness of left knee, not elsewhere classified: Secondary | ICD-10-CM | POA: Diagnosis not present

## 2017-01-14 DIAGNOSIS — M25562 Pain in left knee: Secondary | ICD-10-CM

## 2017-01-14 DIAGNOSIS — M6281 Muscle weakness (generalized): Secondary | ICD-10-CM | POA: Diagnosis not present

## 2017-01-14 NOTE — Therapy (Addendum)
[facility] [address] [place], [place], [address] Phone: [phone]   Fax:  [phone]  Physical Therapy Treatment  Patient Details  Name: [name] MRN: [id number] Date of Birth: October 30, 1965 Referring Provider: Arther Abbott, MD  Encounter Date: 01/14/2017      PT End of Session - 01/14/17 0826    Visit Number 4   Number of Visits 9   Date for PT Re-Evaluation 02/02/17   Authorization Type [employer]   Authorization Time Period 01/05/17 to 02/04/17   PT Start Time 0818   PT Stop Time 0902   PT Time Calculation (min) 44 min   Activity Tolerance Patient tolerated treatment well;No increased pain   Behavior During Therapy WFL for tasks assessed/performed      Past Medical History:  Diagnosis Date  . Arthritis   . Asthma   . Chest pain    cath 2005 norm cors, repeat cath Feb 2011 normal cors and only minimally  elevated pulmonary pressures  . Crohn disease ([place])   . Diverticulosis   . GERD (gastroesophageal reflux disease)   . Hiatal hernia   . Hypertension   . Obesity   . Palpitations   . Sleep apnea sleep study 11/03/2009   mild-no cpap; does sometimes, doesnt use every night.    Past Surgical History:  Procedure Laterality Date  . ABDOMINAL HYSTERECTOMY    . ABDOMINAL HYSTERECTOMY    . ANKLE ARTHROSCOPY  06/01/2012   Procedure: ANKLE ARTHROSCOPY;  Surgeon: Colin Rhein, MD;  Location: [facility];  Service: Orthopedics;  Laterality: Left;  left ankle arthorsocpy with extensive debridement and gastroc slide  . CARDIAC CATHETERIZATION  11/22/2009 and 2005   WNL  . CESAREAN SECTION    . CHOLECYSTECTOMY  09/08/2006   lap. chole.  . CHONDROPLASTY  06/17/2012   Procedure: CHONDROPLASTY;  Surgeon: Carole Civil, MD;  [facility];  Service: Orthopedics;  Laterality: Right;  right patella  . ESOPHAGOGASTRODUODENOSCOPY N/A 05/14/2015   Procedure: ESOPHAGOGASTRODUODENOSCOPY (EGD);   Surgeon: Rogene Houston, MD;  [facility];  Service: Endoscopy;  Laterality: N/A;  730  . INGUINAL HERNIA REPAIR  10/30/2008   right  . KNEE ARTHROSCOPY WITH LATERAL MENISECTOMY Left 12/23/2016   Procedure: LEFT KNEE ARTHROSCOPY WITH LATERAL MENISECTOMY;  Surgeon: Carole Civil, MD;  [facility];  Service: Orthopedics;  Laterality: Left;  . KNEE ARTHROSCOPY WITH MEDIAL MENISECTOMY Left 12/23/2016   Procedure: LEFT KNEE ARTHROSCOPY WITH MEDIAL MENISECTOMY CHONDROPLASTY PATELLA  AND MEDIAL FEMORAL CONDYLE LEFT KNEE;  Surgeon: Carole Civil, MD;  Location: [facility];  Service: Orthopedics;  Laterality: Left;  . SHOULDER SURGERY     right x 2     There were no vitals filed for this visit.      Subjective Assessment - 01/14/17 0823    Subjective Pt stated knee is stiff in morning and continues to c/o uncomfortable popping with gait.  Current pain scale 3/10   Pertinent History Arthritis, HTN, GERD, Lt ankle scope 2013, Rt knee scope 2013, Lt knee scope 12/23/16   How long can you sit comfortably? only stiffness when she goes to move after sitting for a while.    Patient Stated Goals improve endurance and strength    Currently in Pain? Yes   Pain Score 3    Pain Location Knee   Pain Orientation Left   Pain Descriptors / Indicators Tightness;Aching  stiffness   Pain  Type Surgical pain   Pain Onset 1 to 4 weeks ago   Pain Frequency Intermittent   Aggravating Factors  weight bearing for long periods, increased reps with some exercises   Pain Relieving Factors elevation and ice                         [facility] Adult PT Treatment/Exercise - 01/14/17 0001      Knee/Hip Exercises: Stretches   Sports administrator 3 reps;30 seconds   Quad Stretch Limitations prone with rope   Knee: Self-Stretch to increase Flexion 10 seconds   Knee: Self-Stretch Limitations knee drives for flexion on 12" step 10x 10"     Knee/Hip Exercises: Standing   Knee Flexion Both;10 reps    Knee Flexion Limitations 5#, x2 sets on Lt    Forward Step Up 2 sets;Left;Hand Hold: 0;Step Height: 6"   Forward Step Up Limitations 2nd set with knee drive    SLS Lt 25", Rt 17" max of 2 no H.3HA   Gait Training heel to toe pattern, narrow BOS and appropriate UE swing no AD x 552 ft   Other Standing Knee Exercises tandem stance 3x 20" on foam     Knee/Hip Exercises: Supine   Heel Slides 2 sets;10 reps   Heel Slides Limitations 1st set AROM, second set AAROM                 PT Education - 01/13/17 0815    Education provided Yes   Education Details gait training and encouraged pt to work on this at home    Person(s) Educated Patient   Methods Explanation;Verbal cues   Comprehension Verbalized understanding;Returned demonstration          PT Short Term Goals - 01/14/17 0857      PT SHORT TERM GOAL #1   Title Pt will demo understanding and consistency with her HEP to improve knee ROM and strength.    Baseline 04/12:  Reports compliance daily   Status Achieved           PT Long Term Goals - 01/14/17 0858      PT LONG TERM GOAL #1   Title Pt will demo improved BLE strength to 5/5 MMT which will increase her safety with functional activity.    Status On-going     PT LONG TERM GOAL #2   Title Pt will complete 5x sit to stand in less than 10 sec, without UE support, to represent an improvement in her functional LE strength and power.      PT LONG TERM GOAL #3   Title Pt will demo Lt knee flexion ROM to atleast 100 deg, which will improve her ability to complete sit to stand transitions without significant difficulty.    Baseline 01/14/2017:  AROM 108 degrees flexion   Status Achieved     PT LONG TERM GOAL #4   Title Pt will maintain SLS on each LE for atleast 20 sec without LOB, 2/3 trials, which will improve her safety with single leg support at home.    Status On-going     PT LONG TERM GOAL #5   Title Pt will report improved walking tolerance up to atleast  45 minutes consecutively on her feet without pain, which will prepare her for return to work.                Plan - 01/14/17 0903    Clinical Impression Statement Continue session focus  on therex to improve knee ROM and strengthening.  Pt entered dept without AD, gait training complete to improve mechanics with increased stance phase Lt LE and appropraite UE swing.  Pt c/o "knee popping" during gait with increased discomfort with patella mobs R/L.  Pt able to complete all therex wtih no reports of increased pain.  Improved flexion at EOS following mobility therex to 108 degrees.  EOS with static balance activtiies with intermittent UE support to decrease her unsteadiness.     Rehab Potential Good   PT Frequency 2x / week   PT Duration 4 weeks   PT Treatment/Interventions ADLs/Self Care Home Management;Cryotherapy;Gait training;Stair training;Functional mobility training;Therapeutic activities;Therapeutic exercise;Neuromuscular re-education;Balance training;Manual techniques;Dry needling;Passive range of motion;Scar mobilization;Patient/family education   PT Next Visit Plan knee flexion ROM, balance/proprioceptive activity   PT Home Exercise Plan seated knee flexion AROM x10 reps, prone knee flexion AROM x10 reps, BLE bridge x10 reps       Patient will benefit from skilled therapeutic intervention in order to improve the following deficits and impairments:  Abnormal gait, Decreased activity tolerance, Decreased balance, Difficulty walking, Decreased safety awareness, Impaired flexibility, Decreased strength, Decreased range of motion, Decreased endurance, Increased muscle spasms, Postural dysfunction, Pain, Improper body mechanics  Visit Diagnosis: Stiffness of left knee, not elsewhere classified  Other abnormalities of gait and mobility  Muscle weakness (generalized)  Acute pain of left knee     Problem List Patient Active Problem List   Diagnosis Date Noted  . Derangement of  anterior horn of lateral meniscus of left knee   . Derangement of posterior horn of medial meniscus of left knee   . Chest pain 04/25/2016  . Hypokalemia 04/25/2016  . Hyperglycemia 04/25/2016  . Diverticulitis of colon 07/17/2014  . Nausea without vomiting 07/17/2014  . Crohn disease ([place]) 07/17/2014  . Diverticulitis of colon without hemorrhage 08/10/2013  . History of arthroscopy of right knee 07/25/2012  . Difficulty in walking(719.7) 06/29/2012  . Weakness of right leg 06/29/2012  . Posterior tibial tendonitis 11/11/2011  . PTTD (posterior tibial tendon dysfunction) 11/11/2011  . Knee pain, right 11/11/2011  . Chest pain 07/16/2011  . Primary osteoarthritis of left knee 02/25/2010  . PAIN IN JOINT, MULTIPLE SITES 02/25/2010  . Essential hypertension 08/13/2009  . HIATAL HERNIA 08/13/2009  . PALPITATIONS 08/13/2009  . DYSPNEA 08/13/2009   Ihor [place], [place]; [place]  Aldona Lento 01/14/2017, 9:23 AM  [place], [place], [address] Phone: [phone]   Fax:  [phone]  Name: [name] MRN: [id number] Date of Birth: 1966-06-24

## 2017-01-18 ENCOUNTER — Ambulatory Visit (INDEPENDENT_AMBULATORY_CARE_PROVIDER_SITE_OTHER): Payer: 59 | Admitting: Orthopedic Surgery

## 2017-01-18 DIAGNOSIS — Z9889 Other specified postprocedural states: Secondary | ICD-10-CM

## 2017-01-18 DIAGNOSIS — M1711 Unilateral primary osteoarthritis, right knee: Secondary | ICD-10-CM

## 2017-01-18 DIAGNOSIS — Z4889 Encounter for other specified surgical aftercare: Secondary | ICD-10-CM

## 2017-01-18 NOTE — Progress Notes (Signed)
Arthroscopy left knee on 12/23/2016  Here today for right knee orthovisc injection #2  She does complain of increased pain in her left knee last night it was uncomfortable for her and she had 4-5 out of 10 pain other than that she's been doing reasonably well.  Right knee looks except before injection  Lateral approach alcohol and ethyl chloride to prepare the knee for injection. Lateral portal orthosis Injected no complications follow-up one week

## 2017-01-18 NOTE — Patient Instructions (Addendum)
Knee Injection, Care After Refer to this sheet in the next few weeks. These instructions provide you with information about caring for yourself after your procedure. Your health care provider may also give you more specific instructions. Your treatment has been planned according to current medical practices, but problems sometimes occur. Call your health care provider if you have any problems or questions after your procedure. What can I expect after the procedure? After the procedure, it is common to have:  Soreness.  Warmth.  Swelling. You may have more pain, swelling, and warmth than you did before the injection. This reaction may last for about one day. Follow these instructions at home: Bathing   If you were given a bandage (dressing), keep it dry until your health care provider says it can be removed. Ask your health care provider when you can start showering or taking a bath. Managing pain, stiffness, and swelling   If directed, apply ice to the injection area:  Put ice in a plastic bag.  Place a towel between your skin and the bag.  Leave the ice on for 20 minutes, 2-3 times per day.  Do not apply heat to your knee.  Raise the injection area above the level of your heart while you are sitting or lying down. Activity   Avoid strenuous activities for as long as directed by your health care provider. Ask your health care provider when you can return to your normal activities. General instructions   Take medicines only as directed by your health care provider.  Do not take aspirin or other over-the-counter medicines unless your health care provider says you can.  Check your injection site every day for signs of infection. Watch for:  Redness, swelling, or pain.  Fluid, blood, or pus.  Follow your health care provider's instructions about dressing changes and removal. Contact a health care provider if:  You have symptoms at your injection site that last longer than  two days after your procedure.  You have redness, swelling, or pain in your injection area.  You have fluid, blood, or pus coming from your injection site.  You have warmth in your injection area.  You have a fever.  Your pain is not controlled with medicine. Get help right away if:  Your knee turns very red.  Your knee becomes very swollen.  Your knee pain is severe. This information is not intended to replace advice given to you by your health care provider. Make sure you discuss any questions you have with your health care provider. Document Released: 10/12/2014 Document Revised: 05/27/2016 Document Reviewed: 08/01/2014 Elsevier Interactive Patient Education  2017 [employer].

## 2017-01-19 ENCOUNTER — Telehealth (HOSPITAL_COMMUNITY): Payer: Self-pay | Admitting: Family Medicine

## 2017-01-19 ENCOUNTER — Ambulatory Visit (HOSPITAL_COMMUNITY): Payer: 59 | Admitting: Physical Therapy

## 2017-01-19 NOTE — Telephone Encounter (Signed)
4/17 message left at 9:32 pm on 4/16 to cx appt but no reason was given

## 2017-01-21 ENCOUNTER — Ambulatory Visit (HOSPITAL_COMMUNITY): Payer: 59

## 2017-01-21 DIAGNOSIS — M25662 Stiffness of left knee, not elsewhere classified: Secondary | ICD-10-CM | POA: Diagnosis not present

## 2017-01-21 DIAGNOSIS — R2689 Other abnormalities of gait and mobility: Secondary | ICD-10-CM | POA: Diagnosis not present

## 2017-01-21 DIAGNOSIS — M6281 Muscle weakness (generalized): Secondary | ICD-10-CM | POA: Diagnosis not present

## 2017-01-21 DIAGNOSIS — M25562 Pain in left knee: Secondary | ICD-10-CM

## 2017-01-21 NOTE — Patient Instructions (Signed)
Calf Stretch    Place hands on wall at shoulder height. Keeping back leg straight, bend front leg, feet pointing forward, heels flat on floor. Lean forward slightly until stretch is felt in calf of back leg. Hold stretch 30 seconds, breathing slowly in and out. Repeat stretch with other leg back. Do 3 sessions per day. Variation: Use chair or table for support.  Copyright  VHI. All rights reserved.

## 2017-01-21 NOTE — Therapy (Signed)
[facility] [address] [place], [place], [address] Phone: [phone]   Fax:  [phone]  Physical Therapy Treatment  Patient Details  Name: [name] MRN: [id number] Date of Birth: 08-06-1966 Referring Provider: Arther Abbott, MD  Encounter Date: 01/21/2017      PT End of Session - 01/21/17 0830    Visit Number 5   Number of Visits 9   Date for PT Re-Evaluation 02/02/17   Authorization Type [employer]   Authorization Time Period 01/05/17 to 02/04/17   PT Start Time 0821   PT Stop Time 0902   PT Time Calculation (min) 41 min      Past Medical History:  Diagnosis Date  . Arthritis   . Asthma   . Chest pain    cath 2005 norm cors, repeat cath Feb 2011 normal cors and only minimally  elevated pulmonary pressures  . Crohn disease ([place])   . Diverticulosis   . GERD (gastroesophageal reflux disease)   . Hiatal hernia   . Hypertension   . Obesity   . Palpitations   . Sleep apnea sleep study 11/03/2009   mild-no cpap; does sometimes, doesnt use every night.    Past Surgical History:  Procedure Laterality Date  . ABDOMINAL HYSTERECTOMY    . ABDOMINAL HYSTERECTOMY    . ANKLE ARTHROSCOPY  06/01/2012   Procedure: ANKLE ARTHROSCOPY;  Surgeon: Colin Rhein, MD;  Location: [facility];  Service: Orthopedics;  Laterality: Left;  left ankle arthorsocpy with extensive debridement and gastroc slide  . CARDIAC CATHETERIZATION  11/22/2009 and 2005   WNL  . CESAREAN SECTION    . CHOLECYSTECTOMY  09/08/2006   lap. chole.  . CHONDROPLASTY  06/17/2012   Procedure: CHONDROPLASTY;  Surgeon: Carole Civil, MD;  [facility];  Service: Orthopedics;  Laterality: Right;  right patella  . ESOPHAGOGASTRODUODENOSCOPY N/A 05/14/2015   Procedure: ESOPHAGOGASTRODUODENOSCOPY (EGD);  Surgeon: Rogene Houston, MD;  [facility];  Service: Endoscopy;  Laterality: N/A;  730  . INGUINAL HERNIA REPAIR  10/30/2008   right  . KNEE ARTHROSCOPY WITH LATERAL MENISECTOMY Left 12/23/2016   Procedure: LEFT KNEE ARTHROSCOPY WITH LATERAL MENISECTOMY;  Surgeon: Carole Civil, MD;  [facility];  Service: Orthopedics;  Laterality: Left;  . KNEE ARTHROSCOPY WITH MEDIAL MENISECTOMY Left 12/23/2016   Procedure: LEFT KNEE ARTHROSCOPY WITH MEDIAL MENISECTOMY CHONDROPLASTY PATELLA  AND MEDIAL FEMORAL CONDYLE LEFT KNEE;  Surgeon: Carole Civil, MD;  [facility];  Service: Orthopedics;  Laterality: Left;  . SHOULDER SURGERY     right x 2     There were no vitals filed for this visit.      Subjective Assessment - 01/21/17 0822    Subjective Pt stated main concern with stomach pain.  Reports some discomfort/cramping on Lt calf.  Has discussed symptoms with MD.  Current pain scale 3/10 mainly stiffness.     Pertinent History Arthritis, HTN, GERD, Lt ankle scope 2013, Rt knee scope 2013, Lt knee scope 12/23/16   Patient Stated Goals improve endurance and strength    Currently in Pain? Yes   Pain Score 3    Pain Location Knee   Pain Orientation Left   Pain Descriptors / Indicators Tightness;Aching   Pain Type Surgical pain   Pain Onset 1 to 4 weeks ago   Pain Frequency Intermittent   Aggravating Factors  weight bearing for long periods, increased reps with some exercises  Pain Relieving Factors elevation and ice            [facility] PT Assessment - 01/21/17 0001      Assessment   Medical Diagnosis Lt knee scope    Referring Provider Arther Abbott, MD   Onset Date/Surgical Date 12/23/16   Next MD Visit 01/25/17   Prior Therapy none      Precautions   Precautions None             [facility] Adult PT Treatment/Exercise - 01/21/17 0001      Knee/Hip Exercises: Stretches   Quad Stretch 3 reps;30 seconds   Quad Stretch Limitations prone with rope   Knee: Self-Stretch to increase Flexion 10 seconds   Knee: Self-Stretch Limitations knee drives for flexion on 12" step 10x 10"   Gastroc Stretch  3 reps;30 seconds   Gastroc Stretch Limitations standard     Knee/Hip Exercises: Standing   Knee Flexion Both;10 reps   Knee Flexion Limitations 5#, x2 sets on Lt    Lateral Step Up 15 reps;Hand Hold: 1;Step Height: 4"   Forward Step Up 15 reps;Step Height: 6";Hand Hold: 0  intermittent HHA    SLS Lt 18", Rt 14" max of 3   SLS with Vectors 3x 5"   Gait Training heel to toe pattern, narrow BOS and appropriate UE swing no AD x 552 ft   Other Standing Knee Exercises tandem stance 2x 30" on foam     Knee/Hip Exercises: Supine   Heel Slides 15 reps   Heel Slides Limitations 1-109 degrees                PT Education - 01/21/17 0929    Education provided Yes   Education Details Reviewed s/s with blood clot, no noted heat, redness or intense pain in calf mm, does c/o tightness.  Encouraged pt to go to ER/MD if symptoms occur   Person(s) Educated Patient   Methods Explanation;Demonstration   Comprehension Verbalized understanding          PT Short Term Goals - 01/14/17 0857      PT SHORT TERM GOAL #1   Title Pt will demo understanding and consistency with her HEP to improve knee ROM and strength.    Baseline 04/12:  Reports compliance daily   Status Achieved           PT Long Term Goals - 01/14/17 0858      PT LONG TERM GOAL #1   Title Pt will demo improved BLE strength to 5/5 MMT which will increase her safety with functional activity.    Status On-going     PT LONG TERM GOAL #2   Title Pt will complete 5x sit to stand in less than 10 sec, without UE support, to represent an improvement in her functional LE strength and power.      PT LONG TERM GOAL #3   Title Pt will demo Lt knee flexion ROM to atleast 100 deg, which will improve her ability to complete sit to stand transitions without significant difficulty.    Baseline 01/14/2017:  AROM 108 degrees flexion   Status Achieved     PT LONG TERM GOAL #4   Title Pt will maintain SLS on each LE for atleast 20 sec  without LOB, 2/3 trials, which will improve her safety with single leg support at home.    Status On-going     PT LONG TERM GOAL #5   Title Pt will report improved walking  tolerance up to atleast 45 minutes consecutively on her feet without pain, which will prepare her for return to work.                Plan - 01/21/17 0830    Clinical Impression Statement Pt arrived with c/o cramping in Lt calf, MD aware.  Reviewed s/s with blood clot and encouraged to call MD if symptoms arrive, no redness, heat or reports of intense pain in calf.  Added gastroc stretches to HEP.  Continued session foucs on knee mobility and strengthening.  Began lateral step up for quad strengthening and vector stance to improve stability with intermittent HHA required for proper form and balance assistance.  No reports of increased pain through session, does still c/o tightness in calf region no reports of pain.     Rehab Potential Good   PT Frequency 2x / week   PT Duration 4 weeks   PT Treatment/Interventions ADLs/Self Care Home Management;Cryotherapy;Gait training;Stair training;Functional mobility training;Therapeutic activities;Therapeutic exercise;Neuromuscular re-education;Balance training;Manual techniques;Dry needling;Passive range of motion;Scar mobilization;Patient/family education   PT Next Visit Plan knee flexion ROM, balance/proprioceptive activity.  Add side stepping next session with theraband resistance.   PT Home Exercise Plan seated knee flexion AROM x10 reps, prone knee flexion AROM x10 reps, BLE bridge x10 reps       Patient will benefit from skilled therapeutic intervention in order to improve the following deficits and impairments:  Abnormal gait, Decreased activity tolerance, Decreased balance, Difficulty walking, Decreased safety awareness, Impaired flexibility, Decreased strength, Decreased range of motion, Decreased endurance, Increased muscle spasms, Postural dysfunction, Pain, Improper body  mechanics  Visit Diagnosis: Stiffness of left knee, not elsewhere classified  Other abnormalities of gait and mobility  Muscle weakness (generalized)  Acute pain of left knee     Problem List Patient Active Problem List   Diagnosis Date Noted  . Derangement of anterior horn of lateral meniscus of left knee   . Derangement of posterior horn of medial meniscus of left knee   . Chest pain 04/25/2016  . Hypokalemia 04/25/2016  . Hyperglycemia 04/25/2016  . Diverticulitis of colon 07/17/2014  . Nausea without vomiting 07/17/2014  . Crohn disease ([place]) 07/17/2014  . Diverticulitis of colon without hemorrhage 08/10/2013  . History of arthroscopy of right knee 07/25/2012  . Difficulty in walking(719.7) 06/29/2012  . Weakness of right leg 06/29/2012  . Posterior tibial tendonitis 11/11/2011  . PTTD (posterior tibial tendon dysfunction) 11/11/2011  . Knee pain, right 11/11/2011  . Chest pain 07/16/2011  . Primary osteoarthritis of left knee 02/25/2010  . PAIN IN JOINT, MULTIPLE SITES 02/25/2010  . Essential hypertension 08/13/2009  . HIATAL HERNIA 08/13/2009  . PALPITATIONS 08/13/2009  . DYSPNEA 08/13/2009   Ihor Austin, [place]; [place]  Aldona Lento 01/21/2017, 9:32 AM  [facility] [place], [place], [address] Phone: [phone]   Fax:  [phone]  Name: JAILEY BOOTON MRN: [id number] Date of Birth: 04-29-1966

## 2017-01-25 ENCOUNTER — Encounter: Payer: Self-pay | Admitting: Orthopedic Surgery

## 2017-01-25 ENCOUNTER — Telehealth: Payer: Self-pay | Admitting: Orthopedic Surgery

## 2017-01-25 ENCOUNTER — Ambulatory Visit (INDEPENDENT_AMBULATORY_CARE_PROVIDER_SITE_OTHER): Payer: 59 | Admitting: Orthopedic Surgery

## 2017-01-25 DIAGNOSIS — Z4889 Encounter for other specified surgical aftercare: Secondary | ICD-10-CM

## 2017-01-25 DIAGNOSIS — M1711 Unilateral primary osteoarthritis, right knee: Secondary | ICD-10-CM

## 2017-01-25 MED ORDER — IBUPROFEN 800 MG PO TABS: 800.0000 mg | ORAL_TABLET | Freq: Three times a day (TID) | ORAL | 0 refills | Status: DC

## 2017-01-25 NOTE — Progress Notes (Signed)
Chief Complaint  Patient presents with  . Follow-up    orthovisc #3 right knee    Procedure note for injection of Orthovisc  Diagnosis osteoarthritis of the knee  Verbal consent was obtained to inject the knee with Orthovisc. Timeout was completed to confirm the injection site as the right   Knee  Ethyl chloride spray was used for anesthesia Alcohol was used to prep the skin. The infrapatellar lateral portal was used as an injection site and 1 vial of Orthovisc was injected into the knee   No complications were noted   Post op left knee scope: Left knee feels fine other than she's had some cramping in the left leg. She did have a gastroc recession a few years back when she walks she feels tightness in the calf. She was concerned about a blood clot  There are no clinical signs of a blood clot there is no swelling of the leg there is no Homans sign.  Patient is encouraged to see primary care doctor for feelings of fatigue  Refilled ibuprofen for her knee  Follow-up 4 weeks status post Synvisc injection right knee

## 2017-01-25 NOTE — Telephone Encounter (Signed)
Called back to patient, notified.  Work note issued accordingly.

## 2017-01-25 NOTE — Telephone Encounter (Signed)
-----  Message from Carole Civil, MD sent at 01/25/2017 12:16 PM EDT ----- Regarding: RE: Work Status YES ----- Message ----- From: Uvaldo Bristle Sent: 01/25/2017   9:37 AM To: Carole Civil, MD Subject: Work Status                                    Dr Aline Brochure,  RE: ADHIRA, JAMIL [[id number]] - patient is due back here week of 5/23. Her return to work date (from [place] to end on 02/03/17.  Can pt still ret to work 02/03/17, or wait to see you on 02/24/17.   Thank you, Arbie Cookey

## 2017-01-26 ENCOUNTER — Ambulatory Visit (HOSPITAL_COMMUNITY): Payer: 59 | Admitting: Physical Therapy

## 2017-01-26 DIAGNOSIS — M6281 Muscle weakness (generalized): Secondary | ICD-10-CM | POA: Diagnosis not present

## 2017-01-26 DIAGNOSIS — M25662 Stiffness of left knee, not elsewhere classified: Secondary | ICD-10-CM

## 2017-01-26 DIAGNOSIS — M25562 Pain in left knee: Secondary | ICD-10-CM

## 2017-01-26 DIAGNOSIS — R2689 Other abnormalities of gait and mobility: Secondary | ICD-10-CM

## 2017-01-26 NOTE — Therapy (Signed)
[facility] [address] [place], [place], [address] Phone: [phone]   Fax:  [phone]  Physical Therapy Treatment  Patient Details  Name: [name] MRN: [id number] Date of Birth: 13-Nov-1965 Referring Provider: Arther Abbott, MD  Encounter Date: 01/26/2017      PT End of Session - 01/26/17 0734    Visit Number 6   Number of Visits 9   Date for PT Re-Evaluation 02/02/17   Authorization Type [employer]   Authorization Time Period 01/05/17 to 02/04/17   PT Start Time 0734   PT Stop Time 0800  Pt requesting to leave early   PT Time Calculation (min) 26 min   Activity Tolerance Patient tolerated treatment well;No increased pain   Behavior During Therapy WFL for tasks assessed/performed      Past Medical History:  Diagnosis Date  . Arthritis   . Asthma   . Chest pain    cath 2005 norm cors, repeat cath Feb 2011 normal cors and only minimally  elevated pulmonary pressures  . Crohn disease ([place])   . Diverticulosis   . GERD (gastroesophageal reflux disease)   . Hiatal hernia   . Hypertension   . Obesity   . Palpitations   . Sleep apnea sleep study 11/03/2009   mild-no cpap; does sometimes, doesnt use every night.    Past Surgical History:  Procedure Laterality Date  . ABDOMINAL HYSTERECTOMY    . ABDOMINAL HYSTERECTOMY    . ANKLE ARTHROSCOPY  06/01/2012   Procedure: ANKLE ARTHROSCOPY;  Surgeon: Colin Rhein, MD;  Location: [facility];  Service: Orthopedics;  Laterality: Left;  left ankle arthorsocpy with extensive debridement and gastroc slide  . CARDIAC CATHETERIZATION  11/22/2009 and 2005   WNL  . CESAREAN SECTION    . CHOLECYSTECTOMY  09/08/2006   lap. chole.  . CHONDROPLASTY  06/17/2012   Procedure: CHONDROPLASTY;  Surgeon: Carole Civil, MD;  [facility];  Service: Orthopedics;  Laterality: Right;  right patella  . ESOPHAGOGASTRODUODENOSCOPY N/A 05/14/2015   Procedure:  ESOPHAGOGASTRODUODENOSCOPY (EGD);  Surgeon: Rogene Houston, MD;  [facility];  Service: Endoscopy;  Laterality: N/A;  730  . INGUINAL HERNIA REPAIR  10/30/2008   right  . KNEE ARTHROSCOPY WITH LATERAL MENISECTOMY Left 12/23/2016   Procedure: LEFT KNEE ARTHROSCOPY WITH LATERAL MENISECTOMY;  Surgeon: Carole Civil, MD;  [facility];  Service: Orthopedics;  Laterality: Left;  . KNEE ARTHROSCOPY WITH MEDIAL MENISECTOMY Left 12/23/2016   Procedure: LEFT KNEE ARTHROSCOPY WITH MEDIAL MENISECTOMY CHONDROPLASTY PATELLA  AND MEDIAL FEMORAL CONDYLE LEFT KNEE;  Surgeon: Carole Civil, MD;  [facility];  Service: Orthopedics;  Laterality: Left;  . SHOULDER SURGERY     right x 2     There were no vitals filed for this visit.      Subjective Assessment - 01/26/17 0734    Subjective Pt reports that she has to leave by 8 am today so that her husband can get to work. Her knee is doing ok today considering how the weather is today. She still thinks she may have a DVT in her calf and this is causing her some anxiety.    Pertinent History Arthritis, HTN, GERD, Lt ankle scope 2013, Rt knee scope 2013, Lt knee scope 12/23/16   Patient Stated Goals improve endurance and strength    Currently in Pain? Yes   Pain Onset 1 to 4 weeks ago  [facility] PT Assessment - 01/26/17 0001      AROM   Left Knee Extension 0   Left Knee Flexion 107                     [facility] Adult PT Treatment/Exercise - 01/26/17 0001      Knee/Hip Exercises: Standing   Forward Step Up 15 reps;Left;Hand Hold: 0;Step Height: 6"   Forward Step Up Limitations with knee drive    Other Standing Knee Exercises side stepping over 16" hurdle 2x5 reps Lt/Rt    Other Standing Knee Exercises tandem gait along 64f line, x5 trials RT     Knee/Hip Exercises: Seated   Other Seated Knee/Hip Exercises Seated knee flexion stretch 10x10 sec                 PT Education - 01/26/17 0759     Education provided Yes   Education Details discussed expected symptoms with DVT and likelihood that this is not the cause of her symptoms, however pt was encouraged to contact MD if still uneasy about her symptoms; updated HEP and discussed possible d/c next session   Person(s) Educated Patient   Methods Explanation;Verbal cues;Handout   Comprehension Verbalized understanding          PT Short Term Goals - 01/14/17 0857      PT SHORT TERM GOAL #1   Title Pt will demo understanding and consistency with her HEP to improve knee ROM and strength.    Baseline 04/12:  Reports compliance daily   Status Achieved           PT Long Term Goals - 01/14/17 0858      PT LONG TERM GOAL #1   Title Pt will demo improved BLE strength to 5/5 MMT which will increase her safety with functional activity.    Status On-going     PT LONG TERM GOAL #2   Title Pt will complete 5x sit to stand in less than 10 sec, without UE support, to represent an improvement in her functional LE strength and power.      PT LONG TERM GOAL #3   Title Pt will demo Lt knee flexion ROM to atleast 100 deg, which will improve her ability to complete sit to stand transitions without significant difficulty.    Baseline 01/14/2017:  AROM 108 degrees flexion   Status Achieved     PT LONG TERM GOAL #4   Title Pt will maintain SLS on each LE for atleast 20 sec without LOB, 2/3 trials, which will improve her safety with single leg support at home.    Status On-going     PT LONG TERM GOAL #5   Title Pt will report improved walking tolerance up to atleast 45 minutes consecutively on her feet without pain, which will prepare her for return to work.                Plan - 01/26/17 0802    Clinical Impression Statement Pt arrived today with continued anxiety over possible DVT, however there are no signs of DVT including warmth, redness and Homan's sign was negative. Pt was reassured of this, however she was also encouraged  to follow up with MD if she has worsening symptoms or issues. Session continued with activity to improve weight bearing on LLE as well as functional strength and balance. pt able to perform all exercises without increase in pain reported and therapist updated HEP to reflect improvements in function.  Will consider possible d/c next session if no new issues arise.   Rehab Potential Good   PT Frequency 2x / week   PT Duration 4 weeks   PT Treatment/Interventions ADLs/Self Care Home Management;Cryotherapy;Gait training;Stair training;Functional mobility training;Therapeutic activities;Therapeutic exercise;Neuromuscular re-education;Balance training;Manual techniques;Dry needling;Passive range of motion;Scar mobilization;Patient/family education   PT Next Visit Plan possible d/c; update HEP   PT Home Exercise Plan seated knee flexion AROM x10 reps, prone knee flexion AROM x10 reps, BLE bridge x10 reps; vector stance    Consulted and Agree with Plan of Care Patient      Patient will benefit from skilled therapeutic intervention in order to improve the following deficits and impairments:  Abnormal gait, Decreased activity tolerance, Decreased balance, Difficulty walking, Decreased safety awareness, Impaired flexibility, Decreased strength, Decreased range of motion, Decreased endurance, Increased muscle spasms, Postural dysfunction, Pain, Improper body mechanics  Visit Diagnosis: Stiffness of left knee, not elsewhere classified  Muscle weakness (generalized)  Other abnormalities of gait and mobility  Acute pain of left knee     Problem List Patient Active Problem List   Diagnosis Date Noted  . Derangement of anterior horn of lateral meniscus of left knee   . Derangement of posterior horn of medial meniscus of left knee   . Chest pain 04/25/2016  . Hypokalemia 04/25/2016  . Hyperglycemia 04/25/2016  . Diverticulitis of colon 07/17/2014  . Nausea without vomiting 07/17/2014  . Crohn disease  ([place]) 07/17/2014  . Diverticulitis of colon without hemorrhage 08/10/2013  . History of arthroscopy of right knee 07/25/2012  . Difficulty in walking(719.7) 06/29/2012  . Weakness of right leg 06/29/2012  . Posterior tibial tendonitis 11/11/2011  . PTTD (posterior tibial tendon dysfunction) 11/11/2011  . Knee pain, right 11/11/2011  . Chest pain 07/16/2011  . Primary osteoarthritis of left knee 02/25/2010  . PAIN IN JOINT, MULTIPLE SITES 02/25/2010  . Essential hypertension 08/13/2009  . HIATAL HERNIA 08/13/2009  . PALPITATIONS 08/13/2009  . DYSPNEA 08/13/2009    8:07 AM,01/26/17 Elly Modena PT, DPT [facility] [address] [place], [place], [address] Phone: [phone]   Fax:  [phone]  Name: [name] MRN: [id number] Date of Birth: 1966/04/06

## 2017-01-28 ENCOUNTER — Ambulatory Visit (HOSPITAL_COMMUNITY): Payer: 59

## 2017-02-02 ENCOUNTER — Ambulatory Visit (HOSPITAL_COMMUNITY): Payer: 59 | Attending: Orthopedic Surgery | Admitting: Physical Therapy

## 2017-02-02 DIAGNOSIS — M6281 Muscle weakness (generalized): Secondary | ICD-10-CM | POA: Insufficient documentation

## 2017-02-02 DIAGNOSIS — R2689 Other abnormalities of gait and mobility: Secondary | ICD-10-CM | POA: Insufficient documentation

## 2017-02-02 DIAGNOSIS — M25662 Stiffness of left knee, not elsewhere classified: Secondary | ICD-10-CM | POA: Insufficient documentation

## 2017-02-02 DIAGNOSIS — M25562 Pain in left knee: Secondary | ICD-10-CM | POA: Diagnosis not present

## 2017-02-02 NOTE — Therapy (Signed)
[facility] [address] [place], [place], [address] Phone: [phone]   Fax:  [phone]  Physical Therapy Treatment/Discharge  Patient Details  Name: [name] MRN: [id number] Date of Birth: September 24, 1966 Referring Provider: Arther Abbott, MD   Encounter Date: 02/02/2017      PT End of Session - 02/02/17 0808    Visit Number 7   Number of Visits 9   Date for PT Re-Evaluation 02/02/17   Authorization Type [employer]   Authorization Time Period 01/05/17 to 02/04/17   PT Start Time 0742  Pt arrived late    PT Stop Time 0809   PT Time Calculation (min) 27 min   Activity Tolerance Patient tolerated treatment well;No increased pain   Behavior During Therapy WFL for tasks assessed/performed      Past Medical History:  Diagnosis Date  . Arthritis   . Asthma   . Chest pain    cath 2005 norm cors, repeat cath Feb 2011 normal cors and only minimally  elevated pulmonary pressures  . Crohn disease ([place])   . Diverticulosis   . GERD (gastroesophageal reflux disease)   . Hiatal hernia   . Hypertension   . Obesity   . Palpitations   . Sleep apnea sleep study 11/03/2009   mild-no cpap; does sometimes, doesnt use every night.    Past Surgical History:  Procedure Laterality Date  . ABDOMINAL HYSTERECTOMY    . ABDOMINAL HYSTERECTOMY    . ANKLE ARTHROSCOPY  06/01/2012   Procedure: ANKLE ARTHROSCOPY;  Surgeon: Colin Rhein, MD;  Location: [facility];  Service: Orthopedics;  Laterality: Left;  left ankle arthorsocpy with extensive debridement and gastroc slide  . CARDIAC CATHETERIZATION  11/22/2009 and 2005   WNL  . CESAREAN SECTION    . CHOLECYSTECTOMY  09/08/2006   lap. chole.  . CHONDROPLASTY  06/17/2012   Procedure: CHONDROPLASTY;  Surgeon: Carole Civil, MD;  [facility];  Service: Orthopedics;  Laterality: Right;  right patella  . ESOPHAGOGASTRODUODENOSCOPY N/A 05/14/2015   Procedure:  ESOPHAGOGASTRODUODENOSCOPY (EGD);  Surgeon: Rogene Houston, MD;  [facility];  Service: Endoscopy;  Laterality: N/A;  730  . INGUINAL HERNIA REPAIR  10/30/2008   right  . KNEE ARTHROSCOPY WITH LATERAL MENISECTOMY Left 12/23/2016   Procedure: LEFT KNEE ARTHROSCOPY WITH LATERAL MENISECTOMY;  Surgeon: Carole Civil, MD;  [facility];  Service: Orthopedics;  Laterality: Left;  . KNEE ARTHROSCOPY WITH MEDIAL MENISECTOMY Left 12/23/2016   Procedure: LEFT KNEE ARTHROSCOPY WITH MEDIAL MENISECTOMY CHONDROPLASTY PATELLA  AND MEDIAL FEMORAL CONDYLE LEFT KNEE;  Surgeon: Carole Civil, MD;  Location[facility];  Service: Orthopedics;  Laterality: Left;  . SHOULDER SURGERY     right x 2     There were no vitals filed for this visit.      Subjective Assessment - 02/02/17 0744    Subjective Pt reports that she is doing good. She has been trying to do more activity lately with her only complaint being that her knee feels like it wants to pop. She did not perform her HEP much since her last session.    Pertinent History Arthritis, HTN, GERD, Lt ankle scope 2013, Rt knee scope 2013, Lt knee scope 12/23/16   How long can you sit comfortably? only stiffness when she goes to move after sitting for a while.    Patient Stated Goals improve endurance and strength    Currently in Pain? No/denies  Pain Onset 1 to 4 weeks ago            [facility] PT Assessment - 02/02/17 0001      Assessment   Medical Diagnosis Lt knee scope    Referring Provider Arther Abbott, MD    Onset Date/Surgical Date 12/23/16   Next MD Visit 02/26/17   Prior Therapy none      Precautions   Precautions None     Balance Screen   Has the patient fallen in the past 6 months No   Has the patient had a decrease in activity level because of a fear of falling?  No   Is the patient reluctant to leave their home because of a fear of falling?  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      Cognition   Overall Cognitive Status Within Functional Limits for tasks assessed     Observation/Other Assessments   Observations surgical incision healing well and intact     Sensation   Light Touch Appears Intact     AROM   Right Knee Extension 0   Right Knee Flexion 90   Left Knee Extension 0   Left Knee Flexion 110     Strength   Right Hip Flexion 5/5   Right Hip Extension 4/5   Right Hip ABduction 5/5   Left Hip Flexion 5/5   Left Hip Extension 4/5   Left Hip ABduction 5/5   Right Knee Flexion 5/5   Right Knee Extension 5/5   Left Knee Flexion 5/5   Left Knee Extension 5/5   Right Ankle Dorsiflexion 5/5   Left Ankle Dorsiflexion 5/5     Transfers   Five time sit to stand comments  9.4 sec, no UE support      Ambulation/Gait   Gait Comments decreased weight shift to the Lt, using Rt axillary crutch      Standardized Balance Assessment   Standardized Balance Assessment Timed Up and Go Test     Timed Up and Go Test   TUG Comments 7.4 sec, no UE support      High Level Balance   High Level Balance Comments SLS: Rt 13 sec;  Lt 20 sec        Standing gastroc stretch on Lt x20 sec for HEP demo                      PT Education - 02/02/17 0809    Education provided Yes   Education Details reviewed goals met and updated/reviewed HEP to ensure pt understands the importance of maintaining progress and mobility    Person(s) Educated Patient   Methods Explanation;Handout   Comprehension Verbalized understanding          PT Short Term Goals - 02/02/17 0756      PT SHORT TERM GOAL #1   Title Pt will demo understanding and consistency with her HEP to improve knee ROM and strength.    Time 2   Status Achieved           PT Long Term  Goals - 02/02/17 0756      PT LONG TERM GOAL #1   Title  Pt will demo improved BLE strength to 5/5 MMT which will increase her safety with functional activity.    Baseline 5/5 all except hip extensors 4/5 MMT   Status Partially Met     PT LONG TERM GOAL #2   Title Pt will complete 5x sit to stand in less than 10 sec, without UE support, to represent an improvement in her functional LE strength and power.    Status Achieved     PT LONG TERM GOAL #3   Title Pt will demo Lt knee flexion ROM to atleast 100 deg, which will improve her ability to complete sit to stand transitions without significant difficulty.    Baseline 110 deg    Status Achieved     PT LONG TERM GOAL #4   Title Pt will maintain SLS on each LE for atleast 20 sec without LOB, 2/3 trials, which will improve her safety with single leg support at home.    Baseline unable on the Rt    Status Achieved     PT LONG TERM GOAL #5   Title Pt will report improved walking tolerance up to atleast 45 minutes consecutively on her feet without pain, which will prepare her for return to work.    Baseline Pt reporting no issues with activity at this time    Status Achieved               Plan - 02/02/17 0813    Clinical Impression Statement Pt was discharged today having met all of her goals. She demonstrates near full BLE strength, lacking some strength in her hip extensors primarily. She also demonstrates improvements in functional balance and mobility with good performance on the TUG and 5x sit to stand. She currently reports that she is back at her baseline function with the exception of her knee feeling like it needs to pop during weight bearing activity. Therapist updated pt's HEP to reflect remaining areas of limitation and pt verbalized understanding of these updates as well as the importance of consistent adherence with d/c. PT will sign off.    Rehab Potential Good   PT Frequency 2x / week   PT Duration 4 weeks   PT  Treatment/Interventions ADLs/Self Care Home Management;Cryotherapy;Gait training;Stair training;Functional mobility training;Therapeutic activities;Therapeutic exercise;Neuromuscular re-education;Balance training;Manual techniques;Dry needling;Passive range of motion;Scar mobilization;Patient/family education   PT Next Visit Plan d/c with HEP   PT Home Exercise Plan single leg vector stance, bridge with TB, walking/water aerobics program, gastroc stretch   Consulted and Agree with Plan of Care Patient      Patient will benefit from skilled therapeutic intervention in order to improve the following deficits and impairments:  Abnormal gait, Decreased activity tolerance, Decreased balance, Difficulty walking, Decreased safety awareness, Impaired flexibility, Decreased strength, Decreased range of motion, Decreased endurance, Increased muscle spasms, Postural dysfunction, Pain, Improper body mechanics  Visit Diagnosis: Stiffness of left knee, not elsewhere classified  Muscle weakness (generalized)  Other abnormalities of gait and mobility  Acute pain of left knee     Problem List Patient Active Problem List   Diagnosis Date Noted  . Derangement of anterior horn of lateral meniscus of left knee   . Derangement of posterior horn of medial meniscus of left knee   . Chest pain 04/25/2016  . Hypokalemia 04/25/2016  . Hyperglycemia 04/25/2016  . Diverticulitis of colon 07/17/2014  . Nausea without vomiting 07/17/2014  . Crohn disease ([place]) 07/17/2014  . Diverticulitis of colon without hemorrhage 08/10/2013  . History of  arthroscopy of right knee 07/25/2012  . Difficulty in walking(719.7) 06/29/2012  . Weakness of right leg 06/29/2012  . Posterior tibial tendonitis 11/11/2011  . PTTD (posterior tibial tendon dysfunction) 11/11/2011  . Knee pain, right 11/11/2011  . Chest pain 07/16/2011  . Primary osteoarthritis of left knee 02/25/2010  . PAIN IN JOINT, MULTIPLE SITES 02/25/2010  .  Essential hypertension 08/13/2009  . HIATAL HERNIA 08/13/2009  . PALPITATIONS 08/13/2009  . DYSPNEA 08/13/2009    PHYSICAL THERAPY DISCHARGE SUMMARY  Visits from Start of Care: 7  Current functional level related to goals / functional outcomes: See above for more details    Remaining deficits: See above for more details    Education / Equipment: See above for more details Plan: Patient agrees to discharge.  Patient goals were met. Patient is being discharged due to being pleased with the current functional level.  ?????      8:24 AM,02/02/17 Elly Modena PT, DPT [facility] [address] [place], [place], [address] Phone: [phone]   Fax:  [phone]  Name: [name] MRN: [id number] Date of Birth: 06/06/1966

## 2017-02-03 ENCOUNTER — Encounter: Payer: Self-pay | Admitting: Orthopedic Surgery

## 2017-02-04 ENCOUNTER — Encounter (HOSPITAL_COMMUNITY): Payer: Self-pay

## 2017-02-26 ENCOUNTER — Ambulatory Visit (INDEPENDENT_AMBULATORY_CARE_PROVIDER_SITE_OTHER): Payer: 59

## 2017-02-26 ENCOUNTER — Ambulatory Visit (INDEPENDENT_AMBULATORY_CARE_PROVIDER_SITE_OTHER): Payer: 59 | Admitting: Orthopedic Surgery

## 2017-02-26 DIAGNOSIS — Z9889 Other specified postprocedural states: Secondary | ICD-10-CM | POA: Diagnosis not present

## 2017-02-26 DIAGNOSIS — M1711 Unilateral primary osteoarthritis, right knee: Secondary | ICD-10-CM | POA: Diagnosis not present

## 2017-02-26 DIAGNOSIS — M5431 Sciatica, right side: Secondary | ICD-10-CM

## 2017-02-26 DIAGNOSIS — Z4889 Encounter for other specified surgical aftercare: Secondary | ICD-10-CM

## 2017-02-26 MED ORDER — GABAPENTIN 300 MG PO CAPS: 300.0000 mg | ORAL_CAPSULE | Freq: Every day | ORAL | 5 refills | Status: DC

## 2017-02-26 NOTE — Progress Notes (Signed)
Chief complaint is follow-up right knee one month after her doses injection and she is also postoperative from a arthroscopy of the left knee March 2018  The patient reports improved left knee with improved and decreased pain and increased motion and increase function  However she does not report good result from OrthoVISC injection she is still having severe pain at night which she rates a 10 and then pain catching during the day when she is ambulating.    Current Outpatient Prescriptions:  .  albuterol (PROVENTIL) (2.5 MG/3ML) 0.083% nebulizer solution, Take 2.5 mg by nebulization every 6 (six) hours as needed. For shortness of breath, Disp: , Rfl:  .  albuterol (VENTOLIN HFA) 108 (90 Base) MCG/ACT inhaler, Inhale 1-2 puffs into the lungs every 6 (six) hours as needed for wheezing or shortness of breath., Disp: , Rfl:  .  amLODipine (NORVASC) 5 MG tablet, Take 5 mg by mouth at bedtime., Disp: , Rfl:  .  cyclobenzaprine (FLEXERIL) 10 MG tablet, Take 1 tablet (10 mg total) by mouth 3 (three) times daily as needed. For muscle spasms, Disp: 40 tablet, Rfl: 2 .  dicyclomine (BENTYL) 10 MG capsule, Take 1 capsule (10 mg total) by mouth 2 (two) times daily before a meal. (Patient taking differently: Take 10 mg by mouth 2 (two) times daily as needed (abdominal pain/IBS). ), Disp: 60 capsule, Rfl: 3 .  diphenhydrAMINE (BENADRYL) 25 MG tablet, Take 1 tablet (25 mg total) by mouth every 6 (six) hours. (Patient taking differently: Take 25 mg by mouth every 6 (six) hours as needed (for allergic reactions). ), Disp: 20 tablet, Rfl: 0 .  EPINEPHrine (EPIPEN) 0.3 mg/0.3 mL SOAJ injection, Inject 0.3 mLs (0.3 mg total) into the muscle once. (Patient taking differently: Inject 0.3 mg into the muscle daily as needed (for anaphylatic reaction). ), Disp: 1 Device, Rfl: 0 .  HYDROcodone-acetaminophen (NORCO/VICODIN) 5-325 MG tablet, Take 1 tablet by mouth every 4 (four) hours as needed for moderate pain., Disp: 30  tablet, Rfl: 0 .  ibuprofen (ADVIL,MOTRIN) 800 MG tablet, Take 1 tablet (800 mg total) by mouth 3 (three) times daily., Disp: 90 tablet, Rfl: 0 .  losartan (COZAAR) 100 MG tablet, Take 100 mg by mouth at bedtime. , Disp: , Rfl:  .  meclizine (ANTIVERT) 25 MG tablet, Take 25 mg by mouth 3 (three) times daily as needed for dizziness., Disp: , Rfl:  .  meloxicam (MOBIC) 7.5 MG tablet, Take 7.5 mg by mouth 2 (two) times daily as needed. For pain/inflammation., Disp: , Rfl:  .  ondansetron (ZOFRAN) 4 MG tablet, Take 1 tablet (4 mg total) by mouth every 8 (eight) hours as needed for nausea or vomiting., Disp: 30 tablet, Rfl: 1 .  pantoprazole (PROTONIX) 40 MG tablet, TAKE (1) TABLET BY MOUTH TWICE DAILY BEFORE A MEAL., Disp: 60 tablet, Rfl: 1 .  TURMERIC PO, Take 2 capsules by mouth daily., Disp: , Rfl:   Review of Systems  Constitutional: Negative for chills and fever.  Respiratory: Negative for shortness of breath.   Cardiovascular: Negative for chest pain.  Musculoskeletal: Negative for back pain.  Skin: Negative for rash.   Physical Exam  Constitutional: She is oriented to person, place, and time. She appears well-developed and well-nourished.  Neurological: She is alert and oriented to person, place, and time.  Psychiatric: She has a normal mood and affect.   Gait is remarkable for limping favoring the right side  Lumbar spine exam shows no tenderness in the lower  back right her left SI joint. However when we do a straight leg raise although her hamstrings are tight in her straight leg raise was negative she has severe tenderness in the sciatic nerve and posterior knee and posterior calf  Right knee is in varus the medial joint line is tender over the medial femoral condyle and joint line range of motion is severely limited she barely has 100 of flexion at stable motor exam is normal pulses are good skin is intact there is no rash  Left knee has regained full range of motion and strength  and has no swelling or tenderness   X-rays today show varus knee with 75% loss of joint space in the medial compartment secondary bone changes   Encounter Diagnoses  Name Primary?  . S/P arthroscopy of left knee Yes  . Aftercare following surgery   . Primary osteoarthritis of right knee   . Sciatica of right side     Meds ordered this encounter  Medications  . gabapentin (NEURONTIN) 300 MG capsule    Sig: Take 1 capsule (300 mg total) by mouth at bedtime.    Dispense:  90 capsule    Refill:  5    I added gabapentin for her nighttime pain and sciatic nerve irritation and told her to continue ibuprofen and muscle relaxer for that  As far as the knee goes we put her in a right medial OA brace from [employer] to try to unload the medial compartment all see her in 4 weeks for re-discussion and examination

## 2017-02-26 NOTE — Patient Instructions (Signed)
SCIATICA  START 300 MG GABAPENTIN AT NIGHT AND 800 MG IBUPROFEN    ARTHRITIS RIGHT KNEE : UNLOADER BRACE

## 2017-03-05 ENCOUNTER — Ambulatory Visit (HOSPITAL_COMMUNITY)
Admission: RE | Admit: 2017-03-05 | Discharge: 2017-03-05 | Disposition: A | Discharge status: Home / Self Care | Payer: 59 | Source: Ambulatory Visit | Attending: Family Medicine | Admitting: Family Medicine

## 2017-03-05 ENCOUNTER — Other Ambulatory Visit (HOSPITAL_COMMUNITY): Payer: Self-pay | Admitting: Family Medicine

## 2017-03-05 DIAGNOSIS — K5750 Diverticulosis of both small and large intestine without perforation or abscess without bleeding: Secondary | ICD-10-CM | POA: Diagnosis not present

## 2017-03-05 DIAGNOSIS — K639 Disease of intestine, unspecified: Secondary | ICD-10-CM | POA: Diagnosis not present

## 2017-03-05 DIAGNOSIS — K449 Diaphragmatic hernia without obstruction or gangrene: Secondary | ICD-10-CM | POA: Insufficient documentation

## 2017-03-05 DIAGNOSIS — R1031 Right lower quadrant pain: Secondary | ICD-10-CM

## 2017-03-05 DIAGNOSIS — E6609 Other obesity due to excess calories: Secondary | ICD-10-CM | POA: Diagnosis not present

## 2017-03-05 DIAGNOSIS — N200 Calculus of kidney: Secondary | ICD-10-CM | POA: Insufficient documentation

## 2017-03-05 DIAGNOSIS — K5090 Crohn's disease, unspecified, without complications: Secondary | ICD-10-CM | POA: Diagnosis not present

## 2017-03-05 DIAGNOSIS — Z1389 Encounter for screening for other disorder: Secondary | ICD-10-CM | POA: Diagnosis not present

## 2017-03-05 DIAGNOSIS — Z6832 Body mass index (BMI) 32.0-32.9, adult: Secondary | ICD-10-CM | POA: Diagnosis not present

## 2017-03-23 ENCOUNTER — Encounter (INDEPENDENT_AMBULATORY_CARE_PROVIDER_SITE_OTHER): Payer: Self-pay | Admitting: Internal Medicine

## 2017-03-23 NOTE — Telephone Encounter (Signed)
Patient was given an appointment for 04/05/17 at 3:30pm.  A letter was mailed to the patient.

## 2017-03-30 ENCOUNTER — Encounter: Payer: Self-pay | Admitting: Orthopedic Surgery

## 2017-03-30 ENCOUNTER — Ambulatory Visit (INDEPENDENT_AMBULATORY_CARE_PROVIDER_SITE_OTHER): Payer: 59 | Admitting: Orthopedic Surgery

## 2017-03-30 DIAGNOSIS — M170 Bilateral primary osteoarthritis of knee: Secondary | ICD-10-CM

## 2017-03-30 DIAGNOSIS — M5431 Sciatica, right side: Secondary | ICD-10-CM | POA: Diagnosis not present

## 2017-03-30 DIAGNOSIS — Z9889 Other specified postprocedural states: Secondary | ICD-10-CM | POA: Diagnosis not present

## 2017-03-30 NOTE — Progress Notes (Signed)
  This is a follow-up visit  Chief Complaint  Patient presents with  . Follow-up    RT KNEE  Surgery date March 21   She had knee arthroscopy on the left knee in March she had or throat disc injection in the right knee did not respond well seem to have some nighttime pain which was not weightbearing pain. We did put her on gabapentin she is sleeping through the night and some of the hip and leg pain have resolved  She has had an MRI which showed disc disease in the lumbar spine  At this point we will continue with osteoarthritis bracing and continue with gabapentin for the neurogenic pain and follow-up in 4 months for x-ray right knee   Encounter Diagnoses  Name Primary?  . S/P arthroscopy of left knee Yes  . Sciatica of right side   . Primary osteoarthritis of both knees

## 2017-04-02 MED FILL — AMLODIPINE BESYLATE 5 MG TA: 5 | 90 days supply | Qty: 90 | Fill #1

## 2017-04-02 MED FILL — LOSARTAN POTASSIUM 100 MG T: 100 | 90 days supply | Qty: 90 | Fill #1

## 2017-04-02 MED FILL — GABAPENTIN 300 MG CAPSULE: 300 | 30 days supply | Qty: 30 | Fill #0

## 2017-04-05 ENCOUNTER — Ambulatory Visit (INDEPENDENT_AMBULATORY_CARE_PROVIDER_SITE_OTHER): Payer: Self-pay | Admitting: Internal Medicine

## 2017-04-23 ENCOUNTER — Encounter (INDEPENDENT_AMBULATORY_CARE_PROVIDER_SITE_OTHER): Payer: Self-pay | Admitting: Internal Medicine

## 2017-04-23 ENCOUNTER — Ambulatory Visit (INDEPENDENT_AMBULATORY_CARE_PROVIDER_SITE_OTHER): Payer: 59 | Admitting: Internal Medicine

## 2017-04-23 VITALS — BP 150/80 | HR 64 | Temp 98.6°F | Ht 71.5 in | Wt 228.0 lb

## 2017-04-23 DIAGNOSIS — K5000 Crohn's disease of small intestine without complications: Secondary | ICD-10-CM | POA: Diagnosis not present

## 2017-04-23 DIAGNOSIS — K219 Gastro-esophageal reflux disease without esophagitis: Secondary | ICD-10-CM

## 2017-04-23 NOTE — Patient Instructions (Signed)
OV in 1 year. Can take Protonix to once a day.

## 2017-04-23 NOTE — Progress Notes (Signed)
Subjective:    Patient ID: [name], female    DOB: 1965/12/05, 51 y.o.   MRN: [id number] Wt in 07/2015 253 Ht 71.5in HPI Here today for f/u. Last seen in 2016 by Dr. Laural Golden. Hx of GERD. Underwent and EGD in August of 2016 Appetite is good. She has lost 27 pounds since 2016. Weight loss intentional.  Usually has a BM x 1 a day or every 2 days.    05/14/2015 EGD: chest pain.  Impression:  Schatzki's ring and small sliding hiatal hernia. Gastroduodenitis. Antral biopsy taken. No evidence of erosive esophagitis or peptic ulcer disease. Schatzki's ring was disrupted with four quadrant biopsy.  CBC    Component Value Date/Time   WBC 5.5 12/16/2016 0825   RBC 4.50 12/16/2016 0825   HGB 13.1 12/16/2016 0825   HCT 38.9 12/16/2016 0825   PLT 380 12/16/2016 0825   MCV 86.4 12/16/2016 0825   MCH 29.1 12/16/2016 0825   MCHC 33.7 12/16/2016 0825   RDW 14.2 12/16/2016 0825   LYMPHSABS 1.7 12/16/2016 0825   MONOABS 0.4 12/16/2016 0825   EOSABS 0.2 12/16/2016 0825   BASOSABS 0.0 12/16/2016 0825     Review of Systems Past Medical History:  Diagnosis Date  . Arthritis   . Asthma   . Chest pain    cath 2005 norm cors, repeat cath Feb 2011 normal cors and only minimally  elevated pulmonary pressures  . Crohn disease ([place])   . Diverticulosis   . GERD (gastroesophageal reflux disease)   . Hiatal hernia   . Hypertension   . Obesity   . Palpitations   . Sleep apnea sleep study 11/03/2009   mild-no cpap; does sometimes, doesnt use every night.    Past Surgical History:  Procedure Laterality Date  . ABDOMINAL HYSTERECTOMY    . ABDOMINAL HYSTERECTOMY    . ANKLE ARTHROSCOPY  06/01/2012   Procedure: ANKLE ARTHROSCOPY;  Surgeon: Colin Rhein, MD;  Location: [facility];  Service: Orthopedics;  Laterality: Left;  left ankle arthorsocpy with extensive debridement and gastroc slide  . CARDIAC CATHETERIZATION  11/22/2009 and 2005   WNL  . CESAREAN SECTION    .  CHOLECYSTECTOMY  09/08/2006   lap. chole.  . CHONDROPLASTY  06/17/2012   Procedure: CHONDROPLASTY;  Surgeon: Carole Civil, MD;  [facility];  Service: Orthopedics;  Laterality: Right;  right patella  . ESOPHAGOGASTRODUODENOSCOPY N/A 05/14/2015   Procedure: ESOPHAGOGASTRODUODENOSCOPY (EGD);  Surgeon: Rogene Houston, MD;  [facility];  Service: Endoscopy;  Laterality: N/A;  730  . INGUINAL HERNIA REPAIR  10/30/2008   right  . KNEE ARTHROSCOPY WITH LATERAL MENISECTOMY Left 12/23/2016   Procedure: LEFT KNEE ARTHROSCOPY WITH LATERAL MENISECTOMY;  Surgeon: Carole Civil, MD;  [facility];  Service: Orthopedics;  Laterality: Left;  . KNEE ARTHROSCOPY WITH MEDIAL MENISECTOMY Left 12/23/2016   Procedure: LEFT KNEE ARTHROSCOPY WITH MEDIAL MENISECTOMY CHONDROPLASTY PATELLA  AND MEDIAL FEMORAL CONDYLE LEFT KNEE;  Surgeon: Carole Civil, MD;  [facility];  Service: Orthopedics;  Laterality: Left;  . SHOULDER SURGERY     right x 2     Allergies  Allergen Reactions  . Other Shortness Of Breath    Lemon grass  . Wasp Venom Anaphylaxis and Shortness Of Breath  . Hydromorphone Hcl Nausea And Vomiting  . Omnipaque [Iohexol] Hives and Nausea Only    Pt. Was premedicated with emergent protocol.  . Pollen Extract Swelling  . Adhesive [Tape]  Rash    Current Outpatient Prescriptions on File Prior to Visit  Medication Sig Dispense Refill  . albuterol (PROVENTIL) (2.5 MG/3ML) 0.083% nebulizer solution Take 2.5 mg by nebulization every 6 (six) hours as needed. For shortness of breath    . albuterol (VENTOLIN HFA) 108 (90 Base) MCG/ACT inhaler Inhale 1-2 puffs into the lungs every 6 (six) hours as needed for wheezing or shortness of breath.    Marland Kitchen amLODipine (NORVASC) 5 MG tablet Take 5 mg by mouth at bedtime.    . cyclobenzaprine (FLEXERIL) 10 MG tablet Take 1 tablet (10 mg total) by mouth 3 (three) times daily as needed. For muscle spasms 40 tablet 2  . EPINEPHrine (EPIPEN)  0.3 mg/0.3 mL SOAJ injection Inject 0.3 mLs (0.3 mg total) into the muscle once. 1 Device 0  . gabapentin (NEURONTIN) 300 MG capsule Take 1 capsule (300 mg total) by mouth at bedtime. 90 capsule 5  . losartan (COZAAR) 100 MG tablet Take 100 mg by mouth at bedtime.     . meloxicam (MOBIC) 7.5 MG tablet Take 7.5 mg by mouth 2 (two) times daily as needed. For pain/inflammation.    . pantoprazole (PROTONIX) 40 MG tablet TAKE (1) TABLET BY MOUTH TWICE DAILY BEFORE A MEAL. 60 tablet 1   No current facility-administered medications on file prior to visit.         Objective:   Physical Exam Blood pressure (!) 150/80, pulse 64, temperature 98.6 F (37 C), height 5' 11.5" (1.816 m), weight 228 lb (103.4 kg). Alert and oriented. Skin warm and dry. Oral mucosa is moist.   . Sclera anicteric, conjunctivae is pink. Thyroid not enlarged. No cervical lymphadenopathy. Lungs clear. Heart regular rate and rhythm.  Abdomen is soft. Bowel sounds are positive. No hepatomegaly. No abdominal masses felt. No tenderness.  No edema to lower extremities.           Assessment & Plan:  GERD: She is doing well.  Take the Protonix x 1 a day.   Hx of small bowel Crohn's> MR enterography in July of 2016 was unremarkable. She was diagnosed in 2012.

## 2017-04-30 ENCOUNTER — Ambulatory Visit (INDEPENDENT_AMBULATORY_CARE_PROVIDER_SITE_OTHER): Payer: 59

## 2017-04-30 ENCOUNTER — Ambulatory Visit (INDEPENDENT_AMBULATORY_CARE_PROVIDER_SITE_OTHER): Payer: 59 | Admitting: Orthopedic Surgery

## 2017-04-30 DIAGNOSIS — M1710 Unilateral primary osteoarthritis, unspecified knee: Secondary | ICD-10-CM

## 2017-04-30 DIAGNOSIS — M1711 Unilateral primary osteoarthritis, right knee: Secondary | ICD-10-CM | POA: Diagnosis not present

## 2017-04-30 MED ORDER — MELOXICAM 7.5 MG PO TABS: 7.5000 mg | ORAL_TABLET | Freq: Two times a day (BID) | ORAL | 5 refills | Status: DC | PRN

## 2017-04-30 MED FILL — MELOXICAM 7.5 MG TABLET: 7.5 | 30 days supply | Qty: 60 | Fill #0

## 2017-04-30 NOTE — Progress Notes (Signed)
Routine follow-up visit  Recheck x-rays of her right knee  Chief complaint pain right knee. She is wearing a OA brace which is helping her get through work but she has significant pain at night at the end of the day after standing all day at work  Her anti-inflammatories don't seem to be helping her night pain but do help her day pain. She's on mobile.  Review of systems insomnia  Exam shows a varus right knee. She is awake alert and oriented 3 mood and affect normal she is well-developed and well-nourished excellent grooming  Tenderness over the medial compartment of the right knee slight decrease in extension but flexion is 120 or more knee is stable there are vascular exam is normal  X-rays show severe arthritis of the right knee with severe varus deformity  Recommend continue anti-inflammatory medication. We will try to manage this nonoperatively until she is 52 or more but based on x-ray and symptoms only see progression here and eventual knee replacement  Encounter Diagnoses  Name Primary?  Marland Kitchen Arthritis of knee Yes  . Primary osteoarthritis of right knee

## 2017-06-24 ENCOUNTER — Other Ambulatory Visit (HOSPITAL_COMMUNITY): Payer: Self-pay | Admitting: Family Medicine

## 2017-06-24 DIAGNOSIS — Z1231 Encounter for screening mammogram for malignant neoplasm of breast: Secondary | ICD-10-CM

## 2017-06-29 MED FILL — GABAPENTIN 300 MG CAPSULE: 300 | 30 days supply | Qty: 30 | Fill #1

## 2017-06-30 MED FILL — AMLODIPINE BESYLATE 5 MG TA: 5 | 90 days supply | Qty: 90 | Fill #0

## 2017-06-30 MED FILL — LOSARTAN POTASSIUM 100 MG T: 100 | 90 days supply | Qty: 90 | Fill #0

## 2017-07-09 ENCOUNTER — Ambulatory Visit (HOSPITAL_COMMUNITY)
Admission: RE | Admit: 2017-07-09 | Discharge: 2017-07-09 | Disposition: A | Discharge status: Home / Self Care | Payer: 59 | Source: Ambulatory Visit | Attending: Family Medicine | Admitting: Family Medicine

## 2017-07-09 DIAGNOSIS — R928 Other abnormal and inconclusive findings on diagnostic imaging of breast: Secondary | ICD-10-CM | POA: Insufficient documentation

## 2017-07-09 DIAGNOSIS — Z1231 Encounter for screening mammogram for malignant neoplasm of breast: Secondary | ICD-10-CM | POA: Insufficient documentation

## 2017-07-13 ENCOUNTER — Other Ambulatory Visit (HOSPITAL_COMMUNITY): Payer: Self-pay | Admitting: Family Medicine

## 2017-07-13 DIAGNOSIS — N6310 Unspecified lump in the right breast, unspecified quadrant: Secondary | ICD-10-CM

## 2017-07-27 ENCOUNTER — Ambulatory Visit (HOSPITAL_COMMUNITY)
Admission: RE | Admit: 2017-07-27 | Discharge: 2017-07-27 | Disposition: A | Discharge status: Home / Self Care | Payer: 59 | Source: Ambulatory Visit | Attending: Family Medicine | Admitting: Family Medicine

## 2017-07-27 ENCOUNTER — Encounter (HOSPITAL_COMMUNITY): Payer: Self-pay

## 2017-07-27 DIAGNOSIS — N6310 Unspecified lump in the right breast, unspecified quadrant: Secondary | ICD-10-CM

## 2017-07-27 DIAGNOSIS — N641 Fat necrosis of breast: Secondary | ICD-10-CM | POA: Diagnosis not present

## 2017-07-27 DIAGNOSIS — R928 Other abnormal and inconclusive findings on diagnostic imaging of breast: Secondary | ICD-10-CM | POA: Diagnosis not present

## 2017-07-27 DIAGNOSIS — N6489 Other specified disorders of breast: Secondary | ICD-10-CM | POA: Diagnosis not present

## 2017-07-31 ENCOUNTER — Encounter (HOSPITAL_COMMUNITY): Payer: Self-pay | Admitting: *Deleted

## 2017-07-31 ENCOUNTER — Emergency Department (HOSPITAL_COMMUNITY): Payer: PRIVATE HEALTH INSURANCE

## 2017-07-31 ENCOUNTER — Emergency Department (HOSPITAL_COMMUNITY)
Admission: EM | Admit: 2017-07-31 | Discharge: 2017-07-31 | Disposition: A | Discharge status: Home / Self Care | Payer: PRIVATE HEALTH INSURANCE | Attending: Emergency Medicine | Admitting: Emergency Medicine

## 2017-07-31 DIAGNOSIS — Y929 Unspecified place or not applicable: Secondary | ICD-10-CM | POA: Insufficient documentation

## 2017-07-31 DIAGNOSIS — M25512 Pain in left shoulder: Secondary | ICD-10-CM | POA: Insufficient documentation

## 2017-07-31 DIAGNOSIS — I10 Essential (primary) hypertension: Secondary | ICD-10-CM | POA: Diagnosis not present

## 2017-07-31 DIAGNOSIS — M25562 Pain in left knee: Secondary | ICD-10-CM | POA: Diagnosis not present

## 2017-07-31 DIAGNOSIS — Y9389 Activity, other specified: Secondary | ICD-10-CM | POA: Diagnosis not present

## 2017-07-31 DIAGNOSIS — J45909 Unspecified asthma, uncomplicated: Secondary | ICD-10-CM | POA: Diagnosis not present

## 2017-07-31 DIAGNOSIS — W01198A Fall on same level from slipping, tripping and stumbling with subsequent striking against other object, initial encounter: Secondary | ICD-10-CM | POA: Insufficient documentation

## 2017-07-31 DIAGNOSIS — W19XXXA Unspecified fall, initial encounter: Secondary | ICD-10-CM

## 2017-07-31 DIAGNOSIS — Z79899 Other long term (current) drug therapy: Secondary | ICD-10-CM | POA: Diagnosis not present

## 2017-07-31 DIAGNOSIS — M25552 Pain in left hip: Secondary | ICD-10-CM | POA: Diagnosis not present

## 2017-07-31 DIAGNOSIS — S93402A Sprain of unspecified ligament of left ankle, initial encounter: Secondary | ICD-10-CM | POA: Diagnosis not present

## 2017-07-31 DIAGNOSIS — Y990 Civilian activity done for income or pay: Secondary | ICD-10-CM | POA: Insufficient documentation

## 2017-07-31 MED ORDER — ACETAMINOPHEN 325 MG PO TABS
650.0000 mg | ORAL_TABLET | Freq: Once | ORAL | Status: AC
  Administered 2017-07-31: 650 mg via ORAL
  Filled 2017-07-31: qty 2

## 2017-07-31 MED ORDER — NAPROXEN 500 MG PO TABS: 500.0000 mg | ORAL_TABLET | Freq: Two times a day (BID) | ORAL | 0 refills | Status: DC

## 2017-07-31 MED ORDER — METHOCARBAMOL 500 MG PO TABS: 500.0000 mg | ORAL_TABLET | Freq: Every evening | ORAL | 0 refills | Status: DC | PRN

## 2017-07-31 NOTE — ED Provider Notes (Signed)
[facility] EMERGENCY DEPARTMENT Provider Note   CSN: [id number] Arrival date & time: 07/31/17  1846     History   Chief Complaint Chief Complaint  Patient presents with  . Fall    HPI [name] is a 51 y.o. female presenting after a trip and fall. 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 No head trauma or loss of consciousness. She is reporting severe left shoulder pain, left hip pain, left knee pain and left ankle pain. She states that the worst is the left shoulder followed by the ankle. She has ambulated afterwards but using her rolling computer as support. Denies weakness, loss of sensation or numbness.   HPI  Past Medical History:  Diagnosis Date  . Arthritis   . Asthma   . Chest pain    cath 2005 norm cors, repeat cath Feb 2011 normal cors and only minimally  elevated pulmonary pressures  . Crohn disease ([place])   . Diverticulosis   . GERD (gastroesophageal reflux disease)   . Hiatal hernia   . Hypertension   . Obesity   . Palpitations   . Sleep apnea sleep study 11/03/2009   mild-no cpap; does sometimes, doesnt use every night.    Patient Active Problem List   Diagnosis Date Noted  . Derangement of anterior horn of lateral meniscus of left knee   . Derangement of posterior horn of medial meniscus of left knee   . Chest pain 04/25/2016  . Hypokalemia 04/25/2016  . Hyperglycemia 04/25/2016  . Diverticulitis of colon 07/17/2014  . Nausea without vomiting 07/17/2014  . Crohn disease ([place]) 07/17/2014  . Diverticulitis of colon without hemorrhage 08/10/2013  . History of arthroscopy of right knee 07/25/2012  . Difficulty in walking(719.7) 06/29/2012  . Weakness of right leg 06/29/2012  . Posterior tibial tendonitis 11/11/2011  . PTTD (posterior tibial tendon dysfunction) 11/11/2011  . Knee pain, right 11/11/2011  . Chest pain 07/16/2011   . Primary osteoarthritis of left knee 02/25/2010  . PAIN IN JOINT, MULTIPLE SITES 02/25/2010  . Essential hypertension 08/13/2009  . HIATAL HERNIA 08/13/2009  . PALPITATIONS 08/13/2009  . DYSPNEA 08/13/2009    Past Surgical History:  Procedure Laterality Date  . ABDOMINAL HYSTERECTOMY    . ABDOMINAL HYSTERECTOMY    . ANKLE ARTHROSCOPY  06/01/2012   Procedure: ANKLE ARTHROSCOPY;  Surgeon: Colin Rhein, MD;  Location: [facility];  Service: Orthopedics;  Laterality: Left;  left ankle arthorsocpy with extensive debridement and gastroc slide  . CARDIAC CATHETERIZATION  11/22/2009 and 2005   WNL  . CESAREAN SECTION    . CHOLECYSTECTOMY  09/08/2006   lap. chole.  . CHONDROPLASTY  06/17/2012   Procedure: CHONDROPLASTY;  Surgeon: Carole Civil, MD;  Location: [facility];  Service: Orthopedics;  Laterality: Right;  right patella  . ESOPHAGOGASTRODUODENOSCOPY N/A 05/14/2015   Procedure: ESOPHAGOGASTRODUODENOSCOPY (EGD);  Surgeon: Rogene Houston, MD;  Location: [facility];  Service: Endoscopy;  Laterality: N/A;  730  . INGUINAL HERNIA REPAIR  10/30/2008   right  . KNEE ARTHROSCOPY WITH LATERAL MENISECTOMY Left 12/23/2016   Procedure: LEFT KNEE ARTHROSCOPY WITH LATERAL MENISECTOMY;  Surgeon: Carole Civil, MD;  Location: [facility];  Service: Orthopedics;  Laterality: Left;  . KNEE ARTHROSCOPY WITH MEDIAL MENISECTOMY Left 12/23/2016   Procedure: LEFT KNEE ARTHROSCOPY WITH MEDIAL MENISECTOMY CHONDROPLASTY PATELLA  AND  MEDIAL FEMORAL CONDYLE LEFT KNEE;  Surgeon: Carole Civil, MD;  Location: [facility];  Service: Orthopedics;  Laterality: Left;  . SHOULDER SURGERY     right x 2     OB History    No data available       Home Medications    Prior to Admission medications   Medication Sig Start Date End Date Taking? Authorizing Provider  albuterol (PROVENTIL) (2.5 MG/3ML) 0.083% nebulizer solution Take 2.5 mg by nebulization every 6 (six) hours as needed. For shortness of  breath    [provider]  albuterol (VENTOLIN HFA) 108 (90 Base) MCG/ACT inhaler Inhale 1-2 puffs into the lungs every 6 (six) hours as needed for wheezing or shortness of breath.    [provider]  amLODipine (NORVASC) 5 MG tablet Take 5 mg by mouth at bedtime.    [provider]  cetirizine (ZYRTEC) 10 MG chewable tablet Chew 10 mg by mouth daily.    [provider]  cyclobenzaprine (FLEXERIL) 10 MG tablet Take 1 tablet (10 mg total) by mouth 3 (three) times daily as needed. For muscle spasms 01/11/17   Carole Civil, MD  EPINEPHrine (EPIPEN) 0.3 mg/0.3 mL SOAJ injection Inject 0.3 mLs (0.3 mg total) into the muscle once. 12/15/13   Linton Flemings, MD  gabapentin (NEURONTIN) 300 MG capsule Take 1 capsule (300 mg total) by mouth at bedtime. 02/26/17   Carole Civil, MD  losartan (COZAAR) 100 MG tablet Take 100 mg by mouth at bedtime.     [provider]  meloxicam (MOBIC) 7.5 MG tablet Take 1 tablet (7.5 mg total) by mouth 2 (two) times daily as needed. For pain/inflammation. 04/30/17   Carole Civil, MD  methocarbamol (ROBAXIN) 500 MG tablet Take 1 tablet (500 mg total) by mouth at bedtime as needed for muscle spasms. 07/31/17   Avie Echevaria B, PA-C  naproxen (NAPROSYN) 500 MG tablet Take 1 tablet (500 mg total) by mouth 2 (two) times daily with a meal. 07/31/17   Avie Echevaria B, PA-C  pantoprazole (PROTONIX) 40 MG tablet TAKE (1) TABLET BY MOUTH TWICE DAILY BEFORE A MEAL. 12/24/16   Rehman, Mechele Dawley, MD    Family History Family History  Problem Relation Age of Onset  . Hypertension Mother   . Lupus Father   . COPD Father   . Congestive Heart Failure Maternal Grandmother     Social History Social History  Substance Use Topics  . Smoking status: Never Smoker  . Smokeless tobacco: Never Used  . Alcohol use No     Allergies   Other; Wasp venom; Hydromorphone hcl; Omnipaque [iohexol]; Pollen extract; and Adhesive  [tape]   Review of Systems Review of Systems  HENT: Negative for ear pain, facial swelling and trouble swallowing.   Eyes: Negative for pain and visual disturbance.  Respiratory: Negative for cough, shortness of breath, wheezing and stridor.   Cardiovascular: Negative for chest pain and palpitations.  Gastrointestinal: Negative for abdominal pain, nausea and vomiting.  Genitourinary: Negative for difficulty urinating and flank pain.  Musculoskeletal: Positive for arthralgias, joint swelling and myalgias. Negative for back pain, neck pain and neck stiffness.  Skin: Negative for color change, pallor and rash.  Neurological: Negative for dizziness, seizures, syncope, facial asymmetry, weakness, light-headedness, numbness and headaches.     Physical Exam Updated Vital Signs BP (!) 145/98   Pulse 76   Temp 98.8 F (37.1 C) (Oral)   Resp 18   Ht 5' 11"  (  1.803 m)   Wt 98.4 kg (217 lb)   SpO2 100%   BMI 30.27 kg/m   Physical Exam  Constitutional: She appears well-developed and well-nourished. No distress.  Afebrile, nontoxic-appearing, sitting in bed in no acute distress.  HENT:  Head: Normocephalic and atraumatic.  Eyes: Conjunctivae and EOM are normal.  Neck: Normal range of motion. Neck supple.  Cardiovascular: Normal rate, regular rhythm, normal heart sounds and intact distal pulses.   No murmur heard. Pulmonary/Chest: Effort normal and breath sounds normal. No respiratory distress. She has no wheezes. She has no rales.  Musculoskeletal: She exhibits edema and tenderness. She exhibits no deformity.  Tenderness palpation of the lateral left malleolus with edema. Full range of motion of the ankle, negative anterior drawer. Difficulty with full active flexion of the left shoulder. Tender to palpation of the glenohumeral joint. Positive Hawkins test. Tender to palpation of the left hip and knee  Neurological: She is alert. No sensory deficit. She exhibits normal muscle tone.  5  out of 5 strength to flexion/extension/abduction/adduction of the shoulders, flexion extension at the elbow, grips bilaterally. 5 out of 5 strength to plantar flexion dorsiflexion at the ankles bilaterally. 5 strength to flexion and extension at the knee and hip flexion bilaterally. And station intact, neurovascularly intact.  Skin: Skin is warm. Capillary refill takes less than 2 seconds. No rash noted. She is not diaphoretic. No erythema. No pallor.  Psychiatric: She has a normal mood and affect.  Nursing note and vitals reviewed.    ED Treatments / Results  Labs (all labs ordered are listed, but only abnormal results are displayed) Labs Reviewed - No data to display  EKG  EKG Interpretation None       Radiology Dg Ankle Complete Left  Result Date: 07/31/2017 CLINICAL DATA:  Fall EXAM: LEFT ANKLE COMPLETE - 3+ VIEW COMPARISON:  None. FINDINGS: Lateral soft tissue swelling. No acute displaced fracture or malalignment. Ankle mortise is symmetric. Small plantar calcaneal spur IMPRESSION: Soft tissue swelling.  No definite acute osseous abnormality Electronically Signed   By: Donavan Foil M.D.   On: 07/31/2017 20:52   Dg Shoulder Left  Result Date: 07/31/2017 CLINICAL DATA:  Fall with pain EXAM: LEFT SHOULDER - 2+ VIEW COMPARISON:  None. FINDINGS: No fracture or dislocation.  The left lung apex is clear. IMPRESSION: No acute osseous abnormality. Electronically Signed   By: Donavan Foil M.D.   On: 07/31/2017 20:49   Dg Knee Complete 4 Views Left  Result Date: 07/31/2017 CLINICAL DATA:  Fall with knee pain EXAM: LEFT KNEE - COMPLETE 4+ VIEW COMPARISON:  MRI 11/03/2016, radiograph 10/27/2016 FINDINGS: No fracture or dislocation. Mild degenerative changes involving the medial, lateral and patellofemoral compartments. No large effusion. IMPRESSION: Mild degenerative changes.  No acute osseous abnormality. Electronically Signed   By: Donavan Foil M.D.   On: 07/31/2017 20:51   Dg Hip  Unilat W Or Wo Pelvis 2-3 Views Left  Result Date: 07/31/2017 CLINICAL DATA:  Fall, with hip pain EXAM: DG HIP (WITH OR WITHOUT PELVIS) 2-3V LEFT COMPARISON:  None. FINDINGS: There is no evidence of hip fracture or dislocation. There is no evidence of arthropathy or other focal bone abnormality. IMPRESSION: Negative. Electronically Signed   By: Donavan Foil M.D.   On: 07/31/2017 20:53    Procedures Procedures (including critical care time)  Medications Ordered in ED Medications  acetaminophen (TYLENOL) tablet 650 mg (650 mg Oral Given 07/31/17 2002)     Initial Impression / Assessment  and Plan / ED Course  I have reviewed the triage vital signs and the nursing notes.  Pertinent labs & imaging results that were available during my care of the patient were reviewed by me and considered in my medical decision making (see chart for details).    51 year old female presenting after a trip and fall left shoulder left knee and left hip and left ankle pain.  Plain films without any evidence of acute fracture or dislocation.  Will have her shoulder placed in a sling, ankle brace and knee brace and have her follow-up with her PCP. [facility] protocol indicated and discussed.  Discharge home with symptomatic relief and follow up  Discussed strict return precautions and advised to return to the emergency department if experiencing any new or worsening symptoms. Instructions were understood and patient agreed with discharge plan.  Final Clinical Impressions(s) / ED Diagnoses   Final diagnoses:  Fall, initial encounter  Acute pain of left shoulder  Sprain of left ankle, unspecified ligament, initial encounter  Acute pain of left knee  Acute pain of left hip    New Prescriptions New Prescriptions   METHOCARBAMOL (ROBAXIN) 500 MG TABLET    Take 1 tablet (500 mg total) by mouth at bedtime as needed for muscle spasms.   NAPROXEN (NAPROSYN) 500 MG TABLET    Take 1 tablet (500 mg total) by mouth 2  (two) times daily with a meal.     Dossie Der 07/31/17 2118    Julianne Rice, MD 08/01/17 1750

## 2017-07-31 NOTE — ED Triage Notes (Signed)
Pt was in room 8 in the emergency depart while on her shift. Pt came out of room and her foot hit the wheel of her computer and pt fell backwards into the an ED pt in room 8. Pt tried to reach out and catch herself so she didn't fall in the floor. Pt is now c/o left shoulder pain and tingling down her left arm. Left hip pain. Pt also c/o left knee pain that pops when she walks and left ankle that also pops when she tries to walk.

## 2017-07-31 NOTE — Discharge Instructions (Signed)
As discussed, please follow the [facility] protocol as indicated and these instructions.   Follow-up with your primary care provider if symptoms persist beyond a week. Medication prescribed can help with muscle spasm but cannot be taken if driving or operating machinery.  Return if symptoms worsen, swelling, numbness or any other new concerning symptoms in the meantime.

## 2017-07-31 NOTE — ED Notes (Signed)
Pt states understanding of care given and follow up instructions 

## 2017-08-18 ENCOUNTER — Other Ambulatory Visit: Payer: Self-pay | Admitting: Orthopedic Surgery

## 2017-08-19 MED FILL — GABAPENTIN 300 MG CAPSULE: 300 | 30 days supply | Qty: 30 | Fill #2

## 2017-08-19 MED FILL — MELOXICAM 7.5 MG TABLET: 7.5 | 30 days supply | Qty: 60 | Fill #1

## 2017-09-06 MED FILL — IBUPROFEN 800 MG TABS: 800 | 30 days supply | Qty: 90 | Fill #0

## 2017-09-09 ENCOUNTER — Encounter (HOSPITAL_COMMUNITY): Payer: Self-pay

## 2017-09-09 ENCOUNTER — Ambulatory Visit (HOSPITAL_COMMUNITY): Payer: 59 | Attending: Orthopedic Surgery

## 2017-09-09 ENCOUNTER — Other Ambulatory Visit: Payer: Self-pay

## 2017-09-09 ENCOUNTER — Ambulatory Visit (HOSPITAL_COMMUNITY): Payer: Self-pay

## 2017-09-09 DIAGNOSIS — M25572 Pain in left ankle and joints of left foot: Secondary | ICD-10-CM | POA: Insufficient documentation

## 2017-09-09 DIAGNOSIS — Z1389 Encounter for screening for other disorder: Secondary | ICD-10-CM | POA: Diagnosis not present

## 2017-09-09 DIAGNOSIS — I10 Essential (primary) hypertension: Secondary | ICD-10-CM | POA: Diagnosis not present

## 2017-09-09 DIAGNOSIS — M25562 Pain in left knee: Secondary | ICD-10-CM | POA: Insufficient documentation

## 2017-09-09 DIAGNOSIS — M25512 Pain in left shoulder: Secondary | ICD-10-CM | POA: Insufficient documentation

## 2017-09-09 DIAGNOSIS — B372 Candidiasis of skin and nail: Secondary | ICD-10-CM | POA: Diagnosis not present

## 2017-09-09 DIAGNOSIS — Z23 Encounter for immunization: Secondary | ICD-10-CM | POA: Diagnosis not present

## 2017-09-09 DIAGNOSIS — G8929 Other chronic pain: Secondary | ICD-10-CM | POA: Insufficient documentation

## 2017-09-09 DIAGNOSIS — R29898 Other symptoms and signs involving the musculoskeletal system: Secondary | ICD-10-CM | POA: Insufficient documentation

## 2017-09-09 DIAGNOSIS — Z0001 Encounter for general adult medical examination with abnormal findings: Secondary | ICD-10-CM | POA: Diagnosis not present

## 2017-09-09 DIAGNOSIS — M6281 Muscle weakness (generalized): Secondary | ICD-10-CM | POA: Insufficient documentation

## 2017-09-09 DIAGNOSIS — Z6830 Body mass index (BMI) 30.0-30.9, adult: Secondary | ICD-10-CM | POA: Diagnosis not present

## 2017-09-09 NOTE — Patient Instructions (Signed)
  WALL WALK  Place your affected hand on the wall with the palm facing the wall. Next, walk your fingers up the wall towards overhead. Lastly, slide your hand back down the wall to the starting position.   Perform 2x/day, 10-20 reps holding for about 5 second at the top   Gastrocnemius Stretch Off Step  Standing with the ball of your foot on a step or a stoop with your leg and knee straight, allow your heel to slowly lower down off the step until you feel a stretch on the back of your calf. Hold this stretch for 30 seconds.  Perform 2x/day, 3-5 stretches holding for 30-60 seconds

## 2017-09-10 ENCOUNTER — Telehealth (HOSPITAL_COMMUNITY): Payer: Self-pay

## 2017-09-10 ENCOUNTER — Telehealth (HOSPITAL_COMMUNITY): Payer: Self-pay | Admitting: Family Medicine

## 2017-09-10 NOTE — Telephone Encounter (Signed)
L/m requested instruction on Plan of Care - May we tx one body part at a time or do we need to do all body parts at once.

## 2017-09-10 NOTE — Therapy (Signed)
[facility] [place], [place], [address] Phone: [phone]   Fax:  [phone]  Physical Therapy Evaluation  Patient Details  Name: [name] MRN: [id number] Date of Birth: [birth year] Referring Provider: Geralynn Rile   Encounter Date: 09/09/2017  PT End of Session - 09/09/17 1350    Visit Number  1    Number of Visits  9    Date for PT Re-Evaluation  10/08/17    Authorization Type  [employer]'s [employer] (8 visits approved per body part, 24 total visits)    Authorization Time Period  09/09/17 to 10/07/16    Authorization - Visit Number  1    Authorization - Number of Visits  16 pt approved for 8 visits for knee and 8 visits for ankle    PT Start Time  1353    PT Stop Time  1526    PT Time Calculation (min)  93 min    Activity Tolerance  Patient tolerated treatment well    Behavior During Therapy  [facility] for tasks assessed/performed       Past Medical History:  Diagnosis Date  . Arthritis   . Asthma   . Chest pain    cath 2005 norm cors, repeat cath Feb 2011 normal cors and only minimally  elevated pulmonary pressures  . Crohn disease ([place])   . Diverticulosis   . GERD (gastroesophageal reflux disease)   . Hiatal hernia   . Hypertension   . Obesity   . Palpitations   . Sleep apnea sleep study 11/03/2009   mild-no cpap; does sometimes, doesnt use every night.    Past Surgical History:  Procedure Laterality Date  . ABDOMINAL HYSTERECTOMY    . ABDOMINAL HYSTERECTOMY    . ANKLE ARTHROSCOPY  06/01/2012   Procedure: ANKLE ARTHROSCOPY;  Surgeon: Colin Rhein, MD;  Location: [facility];  Service: Orthopedics;  Laterality: Left;  left ankle arthorsocpy with extensive debridement and gastroc slide  . CARDIAC CATHETERIZATION  11/22/2009 and 2005   WNL  . CESAREAN SECTION    . CHOLECYSTECTOMY  09/08/2006   lap. chole.  . CHONDROPLASTY  06/17/2012   Procedure: CHONDROPLASTY;  Surgeon: Carole Civil, MD;   Location: [facility];  Service: Orthopedics;  Laterality: Right;  right patella  . ESOPHAGOGASTRODUODENOSCOPY N/A 05/14/2015   Procedure: ESOPHAGOGASTRODUODENOSCOPY (EGD);  Surgeon: Rogene Houston, MD;  [facility]: [facility];  Service: Endoscopy;  Laterality: N/A;  730  . INGUINAL HERNIA REPAIR  10/30/2008   right  . KNEE ARTHROSCOPY WITH LATERAL MENISECTOMY Left 12/23/2016   Procedure: LEFT KNEE ARTHROSCOPY WITH LATERAL MENISECTOMY;  Surgeon: Carole Civil, MD;  Location: [facility];  Service: Orthopedics;  Laterality: Left;  . KNEE ARTHROSCOPY WITH MEDIAL MENISECTOMY Left 12/23/2016   Procedure: LEFT KNEE ARTHROSCOPY WITH MEDIAL MENISECTOMY CHONDROPLASTY PATELLA  AND MEDIAL FEMORAL CONDYLE LEFT KNEE;  Surgeon: Carole Civil, MD;  Location: [facility];  Service: Orthopedics;  Laterality: Left;  . SHOULDER SURGERY     right x 2     There were no vitals filed for this visit.   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 Her L ankle rolled in, her L knee twisted, and she caught herself on her L shoulder.  Ever since, she's been having popping, discomfort, and some catching/locking up in her L knee. This has decreased since the fall happened on 07/31/17. She states that her L shoulder is bothering her the most currently. Anything against gravity aggravates her shoulder pain (OH in cabinets, putting her clothes on, etc.). She is R handed, but "does everything with her L hand." She reports pain at her UT and initially had n/t/burning pain down her LUE. She got a steroid shot on Thursday 09/02/17, and she is having much less burning pain in her arm, it is more in her UT area. She states that her L ankle is really tender to increased pressure. She feels it is taking longer to heal because she is still up  walking around for her job. She denies anymore episodes of instability of the L ankle, but she states it pops constantly.    Pertinent History  R knee scope in 2013, L knee scope in March 2018    Limitations  Standing;Walking    How long can you sit comfortably?  no issues    How long can you stand comfortably?  20-30 mins    How long can you walk comfortably?  20-30 mins    Diagnostic tests  x-rays    Patient Stated Goals  get better, get back as close to pain free as possible    Currently in Pain?  Yes    Pain Score  7     Pain Location  Shoulder    Pain Orientation  Left    Pain Descriptors / Indicators  Aching    Pain Type  Chronic pain    Pain Onset  More than a month ago    Pain Frequency  Constant    Aggravating Factors   anything against gravity, move it wrong    Pain Relieving Factors  rest, m relaxors    Effect of Pain on Daily Activities  increases    Multiple Pain Sites  Yes    Pain Score  4    Pain Location  Knee    Pain Orientation  Left    Pain Descriptors / Indicators  Dull popping, catching    Pain Type  Chronic pain    Pain Onset  More than a month ago    Pain Frequency  Constant when in WB    Aggravating Factors   standing, walking too long, stairs    Pain Relieving Factors  rest, elevating, ice    Effect of Pain on Daily Activities  increases    Pain Score  4    Pain Location  Ankle    Pain Orientation  Left    Pain Descriptors / Indicators  Penetrating    Pain Type  Chronic pain    Pain Onset  More than a month ago    Pain Frequency  Constant    Aggravating Factors   standing or walking too long    Pain Relieving Factors  rest, brace    Effect of Pain on Daily Activities  increases         [facility] PT Assessment - 09/10/17 0001      Assessment   Medical Diagnosis  patellofemoral syndrome of L knee, patellar tendonitis of left knee, bursitis of left shoulder, pain and swelling in L ankle    Onset Date/Surgical Date  07/31/17    Next MD Visit  --  December 2018    Prior Therapy  yes for L knee scope earlier this year  Home Environment   Living Environment  Private residence    Additional Comments  has stairs in home and difficulty performing stairs      Prior Function   Level of Independence  Independent    Vocation  Full time employment    Vocation Requirements  lots of walking and standing      Observation/Other Assessments   Observations  L GH joint mobs: Grade II inferior joint mobs painful but end feel appeared WNL. Grade II-III posterior joint mobs non-painful and normal end feel    Focus on Therapeutic Outcomes (FOTO)   50% limitation for shoulder      Posture/Postural Control   Posture/Postural Control  Postural limitations    Postural Limitations  Rounded Shoulders;Forward head;Increased thoracic kyphosis      AROM   Right Shoulder Flexion  -- WNL    Right Shoulder ABduction  -- WNL    Right Shoulder Internal Rotation  -- T4    Right Shoulder External Rotation  -- T3    Left Shoulder Extension  50 Degrees in prone, min pain    Left Shoulder Flexion  162 Degrees sitting, painful; supine 170 min pain at end range    Left Shoulder ABduction  128 Degrees sitting, painful; supine 170 min pain at end range    Left Shoulder Internal Rotation  -- T6, difficult to do, trucal compensations, pain    Left Shoulder External Rotation  -- T2, difficult to do, painful    Left Knee Extension  0    Left Knee Flexion  -- attempted to measure but pt got cramp and didn't measure    Right Ankle Dorsiflexion  0    Right Ankle Plantar Flexion  60    Right Ankle Inversion  20    Right Ankle Eversion  14    Left Ankle Dorsiflexion  -3 pain    Left Ankle Plantar Flexion  55    Left Ankle Inversion  16    Left Ankle Eversion  7      Strength   Right Shoulder Flexion  4+/5    Right Shoulder ABduction  4+/5    Right Shoulder Internal Rotation  5/5    Right Shoulder External Rotation  5/5    Left Shoulder Flexion  4+/5 pain     Left Shoulder ABduction  4+/5 pain    Left Shoulder Internal Rotation  4+/5 less painful than ER    Left Shoulder External Rotation  4+/5 more painful than IR    Right Hip Flexion  4/5    Right Hip Extension  4/5    Right Hip ABduction  4+/5    Left Hip Flexion  4-/5    Left Hip Extension  4-/5    Left Hip ABduction  4+/5    Right Knee Flexion  5/5    Right Knee Extension  5/5    Left Knee Flexion  4+/5 pain    Left Knee Extension  5/5 pain    Right Ankle Dorsiflexion  5/5    Right Ankle Inversion  5/5    Right Ankle Eversion  5/5    Left Ankle Dorsiflexion  4+/5    Left Ankle Inversion  4/5    Left Ankle Eversion  4+/5      Palpation   Patella mobility  crepitus/clicking noted with medial glide with min pain    Spinal mobility  upper cervical spine WNL for PA joint mobs, non-painful; upper thoracic hypomobile with PA joint  mobs, non-painful    Palpation comment  For shoulder: increased tenderness to palpation throughout thoracic and paraspinals, periscapular musculature, UT, pec minor. For knee: min tenderness to palpation of surrounding knee musculature, patellar mobs (see above). For ankle: increased tenderness to palpation of peroneals around the lateral malleolus      Special Tests    Special Tests  Rotator Cuff Impingement;Knee Special Tests;Meniscus Tests;Ankle/Foot Special Tests;Laxity/Instability Tests    Rotator Cuff Impingment tests  Neer impingement test;Hawkins- Kennedy test;Lift- off test;Drop Arm test;Painful Arc of Motion;Lag signs at 90 degrees    Laxity/Instability   Anterior drawer test;Posterior drawer test;Anterior Apprehension test;Relocation test    Knee Special tests   Step-up/Step Down Test    Meniscus Tests  other    Ankle/Foot Special Tests   Anterior Drawer Test;Talar Tilt Test      Neer Impingement test    Findings  Positive    Comments  decreased pain throughout entire flexion ROM and pain did not start until ~90* compared to ~60*       Hawkins-Kennedy test   Findings  Negative    Side  Left      Lift-Off test   Findings  Negative    Side  Left      Lag signs at 90 degrees    Findings  Negative    Side  Left    Comments  ER Lag test      Drop Arm test   Side  Left      Painful Arc of Motion   Side  Left    Comments  mildly positive painful arc as her pain started ~60*, but, her pain continued throughout the rest of her ROM      Anterior Apprehension test   Findings  Positive    Side  Left    Comment  mildly + on L as pt apprehensive about GH ER when in 90* abd      Relocation test   Findings  Negative    Side  Left      Anterior drawer test   Findings  Negative    Side  Left      Posterior drawer test   Findings  Negative    Side   Left      Step-up/Step Down    Findings  Positive    Side   Left    Comments  increased pain and difficulty with L single leg step down from 7" step      other   Findings  Positive    Side  Left    Comments  Thessaly's test + when in ~50* knee flexion -- pt had immediate L knee pain when trying to rotate once in knee flexion      Anterior Drawer Test   Findings  Negative    Side   Left    Comments  minimal to no antrerior translation compared to R, non-painful      Talar Tilt Test    Findings  Postive    Side   Left    Comments  mildly + as she had minimal pain on lateral ankle during test, no laxity noted      Ambulation/Gait   Ambulation Distance (Feet)  226 Feet    Assistive device  None    Gait Pattern  Step-through pattern;Decreased stance time - left;Decreased dorsiflexion - left;Antalgic;Lateral trunk lean to left bil navicular drop, bil toe out      Balance  Balance Assessed  Yes      Static Standing Balance   Static Standing - Balance Support  No upper extremity supported    Static Standing Balance -  Activities   Single Leg Stance - Right Leg;Single Leg Stance - Left Leg    Static Standing - Comment/# of Minutes  R: 8.5 sec or <, L: 3 sec or <          Objective measurements completed on examination: See above findings.        PT Education - 09/09/17 1712    Education provided  Yes    Education Details  exam findings, POC, HEP    Person(s) Educated  Patient    Methods  Explanation;Handout    Comprehension  Verbalized understanding       PT Short Term Goals - 09/09/17 1725      PT SHORT TERM GOAL #1   Title  Pt will be independent with HEP and perform consistently in order to maximize return to PLOF.    Time  2    Period  Weeks    Status  New    Target Date  09/24/17      PT SHORT TERM GOAL #2   Title  Pt will have improved ankle DF ROM to at least 5 deg in order to decrease pain and maximize gait and balance.    Time  2    Period  Weeks    Status  New      PT SHORT TERM GOAL #3   Title  Pt will report being able to sleep through the night without awakening due to knee or ankle pain in order to maximize recovery.    Time  2    Period  Weeks    Status  New      PT SHORT TERM GOAL #4   Title  --    Period  --    Status  --      PT SHORT TERM GOAL #5   Title  --    Period  --    Status  --        PT Long Term Goals - 09/09/17 1728      PT LONG TERM GOAL #1   Title  Pt will have improved SLS to 10sec on LLE to demo improved stability and proprioception of L ankle and maximize gait.    Period  Weeks    Status  New      PT LONG TERM GOAL #2   Title  Pt will have improved overall MMT of BLE by at least a 1/2 grade in order to decrease overall pain and allow pt to perform functional tasks with greater ease.     Period  Weeks    Status  New      PT LONG TERM GOAL #3   Title  Pt will report improved tolerance to standing and walking by 50% or > (to at least 40 mins to 1 hour) without 2/10 ankle and knee pain in order to maximize her function at work.    Period  Weeks    Status  New      PT LONG TERM GOAL #4   Title  Pt will be able to ascend/descend full flight of stairs reciprocally with no  handrail and without knee or ankle pain in order to demo improved functional strength and maximize function at home and work.     Period  Weeks  Status  New             Plan - 09/09/17 1716    Clinical Impression Statement  Pt is pleasant 51 YO F who presents to OPPT with c/o L sided shoulder pain, knee pain, and ankle pain s/p almost-fall while at work. Pt currently presents with deficits illustrated above in objective measures. Most notably, she was positive for Thessaly's meniscus testing, negative for RTC testing, and negative for ankle instability testing. Pt has deficits in strength, ROM, and difficulty performing functional tasks at work and home due to her pain. Pt will benefit from skilled PT intervention to address these impairments in order to maximize her overall function and decrease pain. At the time of PT eval, pt's referral included shoulder, knee, and ankle. PT spoke with Caryl Pina from Dr. Stann Mainland office (the referrign physician) who stated that they will change the pt's shoulder order to an OT referral. Therefore, PT sessions going forward will focus on the pt's knee and ankle.     History and Personal Factors relevant to plan of care:  L knee scope March 2018, R knee scope 2013, L ankle surgery years ago, R shoulder surgery for bone spurs; motivated to participate, still working full-time    Clinical Presentation  Stable    Clinical Presentation due to:  ROM, MMT, SLS, stairs, joint mobs, soft tissue restrictions, functional tasks    Clinical Decision Making  Low    Rehab Potential  Good    PT Frequency  2x / week    PT Duration  4 weeks    PT Treatment/Interventions  ADLs/Self Care Home Management;Cryotherapy;Electrical Stimulation;Moist Heat;Traction;Gait training;Stair training;Functional mobility training;Therapeutic activities;Therapeutic exercise;Balance training;Neuromuscular re-education;Cognitive remediation;Patient/family education;Manual techniques;Passive range of  motion;Dry needling;Energy conservation;Taping    PT Next Visit Plan  review goals and HEP, continue meniscus and shoulder special testing; functional BLE strengthening, manual for pain/STM/ankle joint mobs for ROM, balance and stability/proprioceptive work    PT Home Exercise Plan  eval: wall walking, calf stretching    Consulted and Agree with Plan of Care  Patient       Patient will benefit from skilled therapeutic intervention in order to improve the following deficits and impairments:  Abnormal gait, Decreased activity tolerance, Decreased balance, Decreased mobility, Decreased range of motion, Decreased strength, Difficulty walking, Hypomobility, Increased muscle spasms, Increased fascial restricitons, Impaired UE functional use, Improper body mechanics, Postural dysfunction, Pain  Visit Diagnosis: Chronic pain of left knee - Plan: PT plan of care cert/re-cert  Pain in left ankle and joints of left foot - Plan: PT plan of care cert/re-cert  Muscle weakness (generalized) - Plan: PT plan of care cert/re-cert  Other symptoms and signs involving the musculoskeletal system - Plan: PT plan of care cert/re-cert     Problem List Patient Active Problem List   Diagnosis Date Noted  . Derangement of anterior horn of lateral meniscus of left knee   . Derangement of posterior horn of medial meniscus of left knee   . Chest pain 04/25/2016  . Hypokalemia 04/25/2016  . Hyperglycemia 04/25/2016  . Diverticulitis of colon 07/17/2014  . Nausea without vomiting 07/17/2014  . Crohn disease ([place]) 07/17/2014  . Diverticulitis of colon without hemorrhage 08/10/2013  . History of arthroscopy of right knee 07/25/2012  . Difficulty in walking(719.7) 06/29/2012  . Weakness of right leg 06/29/2012  . Posterior tibial tendonitis 11/11/2011  . PTTD (posterior tibial tendon dysfunction) 11/11/2011  . Knee pain, right 11/11/2011  . Chest pain 07/16/2011  .  Primary osteoarthritis of left knee 02/25/2010   . PAIN IN JOINT, MULTIPLE SITES 02/25/2010  . Essential hypertension 08/13/2009  . HIATAL HERNIA 08/13/2009  . PALPITATIONS 08/13/2009  . DYSPNEA 08/13/2009       Geraldine Solar PT, DPT  [facility] [address] [place], [place], [address] Phone: [phone]   Fax:  [phone]  Name: [name] MRN: [id number] Date of Birth: 06/09/1966

## 2017-09-10 NOTE — Telephone Encounter (Signed)
Left a message to schedule her an OT eval and for PT 2x week for 4 weeks.   09/10/17

## 2017-09-23 ENCOUNTER — Telehealth (HOSPITAL_COMMUNITY): Payer: Self-pay | Admitting: Family Medicine

## 2017-09-23 NOTE — Telephone Encounter (Signed)
09/23/17  I called to see about scheduling her therapy and left a 2nd message

## 2017-09-29 ENCOUNTER — Other Ambulatory Visit (HOSPITAL_COMMUNITY): Payer: Self-pay | Admitting: Orthopedic Surgery

## 2017-09-29 DIAGNOSIS — M7541 Impingement syndrome of right shoulder: Secondary | ICD-10-CM

## 2017-09-29 DIAGNOSIS — M7542 Impingement syndrome of left shoulder: Secondary | ICD-10-CM

## 2017-10-08 ENCOUNTER — Ambulatory Visit (HOSPITAL_COMMUNITY): Payer: PRIVATE HEALTH INSURANCE

## 2017-10-14 ENCOUNTER — Ambulatory Visit (HOSPITAL_COMMUNITY)
Admission: RE | Admit: 2017-10-14 | Discharge: 2017-10-14 | Disposition: A | Discharge status: Home / Self Care | Payer: PRIVATE HEALTH INSURANCE | Source: Ambulatory Visit | Attending: Orthopedic Surgery | Admitting: Orthopedic Surgery

## 2017-10-14 ENCOUNTER — Ambulatory Visit (HOSPITAL_COMMUNITY): Payer: PRIVATE HEALTH INSURANCE

## 2017-10-14 ENCOUNTER — Ambulatory Visit (HOSPITAL_COMMUNITY): Payer: PRIVATE HEALTH INSURANCE | Attending: Orthopedic Surgery | Admitting: Physical Therapy

## 2017-10-14 ENCOUNTER — Ambulatory Visit (HOSPITAL_COMMUNITY): Admission: RE | Admit: 2017-10-14 | Payer: PRIVATE HEALTH INSURANCE | Source: Ambulatory Visit

## 2017-10-14 ENCOUNTER — Other Ambulatory Visit (HOSPITAL_COMMUNITY): Payer: Self-pay

## 2017-10-14 ENCOUNTER — Encounter (HOSPITAL_COMMUNITY): Payer: Self-pay | Admitting: Physical Therapy

## 2017-10-14 DIAGNOSIS — M65862 Other synovitis and tenosynovitis, left lower leg: Secondary | ICD-10-CM | POA: Insufficient documentation

## 2017-10-14 DIAGNOSIS — R600 Localized edema: Secondary | ICD-10-CM | POA: Diagnosis not present

## 2017-10-14 DIAGNOSIS — M7542 Impingement syndrome of left shoulder: Secondary | ICD-10-CM | POA: Diagnosis present

## 2017-10-14 DIAGNOSIS — M7541 Impingement syndrome of right shoulder: Secondary | ICD-10-CM | POA: Insufficient documentation

## 2017-10-14 DIAGNOSIS — M25562 Pain in left knee: Secondary | ICD-10-CM | POA: Diagnosis not present

## 2017-10-14 DIAGNOSIS — M7552 Bursitis of left shoulder: Secondary | ICD-10-CM | POA: Diagnosis not present

## 2017-10-14 DIAGNOSIS — G8929 Other chronic pain: Secondary | ICD-10-CM | POA: Diagnosis not present

## 2017-10-14 DIAGNOSIS — M1712 Unilateral primary osteoarthritis, left knee: Secondary | ICD-10-CM | POA: Insufficient documentation

## 2017-10-14 DIAGNOSIS — M6281 Muscle weakness (generalized): Secondary | ICD-10-CM | POA: Insufficient documentation

## 2017-10-14 DIAGNOSIS — R29898 Other symptoms and signs involving the musculoskeletal system: Secondary | ICD-10-CM | POA: Diagnosis not present

## 2017-10-14 DIAGNOSIS — M25572 Pain in left ankle and joints of left foot: Secondary | ICD-10-CM | POA: Diagnosis not present

## 2017-10-14 DIAGNOSIS — M19012 Primary osteoarthritis, left shoulder: Secondary | ICD-10-CM | POA: Insufficient documentation

## 2017-10-14 DIAGNOSIS — M25462 Effusion, left knee: Secondary | ICD-10-CM | POA: Insufficient documentation

## 2017-10-14 NOTE — Therapy (Signed)
[place], [place], [address] Phone: [phone]   Fax:  [phone]  Physical Therapy Treatment  Patient Details  Name: [name] MRN: [id number] Date of Birth: [birth year] Referring Provider: Geralynn Rile   Encounter Date: 10/14/2017  PT End of Session - 10/14/17 1523    Visit Number  2    Number of Visits  9    Date for PT Re-Evaluation  11/11/17    Authorization Type  Worker's Comp (8 visits approved per body part, 24 total visits)    Authorization Time Period  09/09/17 to 10/07/17 initially.  Extended 10/14/17-11/11/17    Authorization - Visit Number  2    Authorization - Number of Visits  16 pt approved for 8 visits for knee and 8 visits for ankle    PT Start Time  1524    PT Stop Time  1610    PT Time Calculation (min)  46 min    Activity Tolerance  Patient tolerated treatment well    Behavior During Therapy  WFL for tasks assessed/performed       Past Medical History:  Diagnosis Date  . Arthritis   . Asthma   . Chest pain    cath 2005 norm cors, repeat cath Feb 2011 normal cors and only minimally  elevated pulmonary pressures  . Crohn disease ([place])   . Diverticulosis   . GERD (gastroesophageal reflux disease)   . Hiatal hernia   . Hypertension   . Obesity   . Palpitations   . Sleep apnea sleep study 11/03/2009   mild-no cpap; does sometimes, doesnt use every night.    Past Surgical History:  Procedure Laterality Date  . ABDOMINAL HYSTERECTOMY    . ABDOMINAL HYSTERECTOMY    . ANKLE ARTHROSCOPY  06/01/2012   Procedure: ANKLE ARTHROSCOPY;  Surgeon: Colin Rhein, MD;  Location: [place];  Service: Orthopedics;  Laterality: Left;  left ankle arthorsocpy with extensive debridement and gastroc slide  . CARDIAC CATHETERIZATION  11/22/2009 and 2005   WNL  . CESAREAN SECTION    . CHOLECYSTECTOMY  09/08/2006   lap. chole.  . CHONDROPLASTY  06/17/2012   Procedure: CHONDROPLASTY;   Surgeon: Carole Civil, MD;  Location[facility];  Service: Orthopedics;  Laterality: Right;  right patella  . ESOPHAGOGASTRODUODENOSCOPY N/A 05/14/2015   Procedure: ESOPHAGOGASTRODUODENOSCOPY (EGD);  Surgeon: Rogene Houston, MD;  [facility];  Service: Endoscopy;  Laterality: N/A;  730  . INGUINAL HERNIA REPAIR  10/30/2008   right  . KNEE ARTHROSCOPY WITH LATERAL MENISECTOMY Left 12/23/2016   Procedure: LEFT KNEE ARTHROSCOPY WITH LATERAL MENISECTOMY;  Surgeon: Carole Civil, MD;  Location: [facility];  Service: Orthopedics;  Laterality: Left;  . KNEE ARTHROSCOPY WITH MEDIAL MENISECTOMY Left 12/23/2016   Procedure: LEFT KNEE ARTHROSCOPY WITH MEDIAL MENISECTOMY CHONDROPLASTY PATELLA  AND MEDIAL FEMORAL CONDYLE LEFT KNEE;  Surgeon: Carole Civil, MD;  Location: [facility];  Service: Orthopedics;  Laterality: Left;  . SHOULDER SURGERY     right x 2     There were no vitals filed for this visit.  Subjective Assessment - 10/14/17 1534    Subjective  Pt comes back today following 12/6 evaluation stating she was waiting approval and further instructions regarding POC since several body parts are involved.  Pt reports she got an MRI taken today on her Lt shoulder and knee.  MD wants only ankle treatment until these results are returned.  States both knees and her Lt ankle are all hurting worse today at 8/10, however this pain only exists in Lt ankle when she is walking.  Constant pain in knees.  Pt reports she cointinues to work regular shifts.    Currently in Pain?  Yes    Pain Score  8     Pain Location  Ankle    Pain Orientation  Left    Pain Descriptors / Indicators  Aching    Aggravating Factors   weightbearing         [facility] PT Assessment - 10/14/17 0001      AROM   Left Ankle Dorsiflexion  13 was -3    Left Ankle Plantar Flexion  55 was 55    Left Ankle Inversion  16 was 16    Left Ankle Eversion  14 was 7          [facility] Adult PT Treatment/Exercise - 10/14/17 0001       Manual Therapy   Manual Therapy  Soft tissue mobilization    Manual therapy comments  completed seperately from all other skilled interventions    Soft tissue mobilization  to Lt ankle, focus on lateral aspect       Ankle Exercises: Seated   ABC's  1 rep    Towel Inversion/Eversion  5 reps    Heel Raises  15 reps    Toe Raise  15 reps           PT Education - 10/14/17 1624    Education provided  Yes    Education Details  reviewed evaluation including goals and HEP    Person(s) Educated  Patient    Methods  Explanation;Demonstration;Verbal cues;Tactile cues;Handout    Comprehension  Returned demonstration;Verbalized understanding;Verbal cues required;Tactile cues required;Need further instruction       PT Short Term Goals - 10/14/17 1635      PT SHORT TERM GOAL #1   Title  Pt will be independent with HEP and perform consistently in order to maximize return to PLOF.    Time  2    Period  Weeks    Status  On-going      PT SHORT TERM GOAL #2   Title  Pt will have improved ankle DF ROM to at least 5 deg in order to decrease pain and maximize gait and balance.    Time  2    Period  Weeks    Status  Achieved      PT SHORT TERM GOAL #3   Title  Pt will report being able to sleep through the night without awakening due to knee or ankle pain in order to maximize recovery.    Time  2    Period  Weeks    Status  On-going        PT Long Term Goals - 10/14/17 1635      PT LONG TERM GOAL #1   Title  Pt will have improved SLS to 10sec on LLE to demo improved stability and proprioception of L ankle and maximize gait.    Period  Weeks    Status  On-going      PT LONG TERM GOAL #2   Title  Pt will have improved overall MMT of BLE by at least a 1/2 grade in order to decrease overall pain and allow pt to perform functional tasks with greater ease.     Period  Weeks    Status  On-going  PT LONG TERM GOAL #3   Title  Pt will report improved tolerance to standing and  walking by 50% or > (to at least 40 mins to 1 hour) without 2/10 ankle and knee pain in order to maximize her function at work.    Period  Weeks    Status  On-going      PT LONG TERM GOAL #4   Title  Pt will be able to ascend/descend full flight of stairs reciprocally with no handrail and without knee or ankle pain in order to demo improved functional strength and maximize function at home and work.     Period  Weeks    Status  On-going            Plan - 10/14/17 1625    Clinical Impression Statement  Pt returns today following 12/6 evaluation.  Admits to not completing therex as she had been busy over the holidays.  Reviewed evaluation including goals and HEP.  Focused session on Lt ankle today per MD orders pending MRI of Lt shoulder and knee.  Pt with overall improved ROM of Lt ankle and able to complete all exercises instructed this session without pain or issues.  Soft tissue mobilization completed to Lt ankle, specifically lateral aspect to decrease restrictions and pain.  Overall improvement in gait noted at end of session.     Rehab Potential  Good    PT Frequency  2x / week    PT Duration  4 weeks    PT Treatment/Interventions  ADLs/Self Care Home Management;Cryotherapy;Electrical Stimulation;Moist Heat;Traction;Gait training;Stair training;Functional mobility training;Therapeutic activities;Therapeutic exercise;Balance training;Neuromuscular re-education;Cognitive remediation;Patient/family education;Manual techniques;Passive range of motion;Dry needling;Energy conservation;Taping    PT Next Visit Plan  Per evaluating Therapists POC, continue meniscus and shoulder special testing; functional BLE strengthening, manual for pain/STM/ankle joint mobs for ROM, balance and stability/proprioceptive work.  follow up on results from MRI next session.  To extend therapy out 4 more weeks as unable to complete any sessions during previous cert period.     PT Home Exercise Plan  eval: wall  walking, calf stretching    Consulted and Agree with Plan of Care  Patient       Patient will benefit from skilled therapeutic intervention in order to improve the following deficits and impairments:  Abnormal gait, Decreased activity tolerance, Decreased balance, Decreased mobility, Decreased range of motion, Decreased strength, Difficulty walking, Hypomobility, Increased muscle spasms, Increased fascial restricitons, Impaired UE functional use, Improper body mechanics, Postural dysfunction, Pain  Visit Diagnosis: Chronic pain of left knee  Pain in left ankle and joints of left foot  Muscle weakness (generalized)     Problem List Patient Active Problem List   Diagnosis Date Noted  . Derangement of anterior horn of lateral meniscus of left knee   . Derangement of posterior horn of medial meniscus of left knee   . Chest pain 04/25/2016  . Hypokalemia 04/25/2016  . Hyperglycemia 04/25/2016  . Diverticulitis of colon 07/17/2014  . Nausea without vomiting 07/17/2014  . Crohn disease ([place]) 07/17/2014  . Diverticulitis of colon without hemorrhage 08/10/2013  . History of arthroscopy of right knee 07/25/2012  . Difficulty in walking(719.7) 06/29/2012  . Weakness of right leg 06/29/2012  . Posterior tibial tendonitis 11/11/2011  . PTTD (posterior tibial tendon dysfunction) 11/11/2011  . Knee pain, right 11/11/2011  . Chest pain 07/16/2011  . Primary osteoarthritis of left knee 02/25/2010  . PAIN IN JOINT, MULTIPLE SITES 02/25/2010  . Essential hypertension 08/13/2009  .  HIATAL HERNIA 08/13/2009  . PALPITATIONS 08/13/2009  . DYSPNEA 08/13/2009   Teena Irani, PTA/CLT [facility], [place] [address] [place], [place], [address] Phone: [phone]   Fax:  [phone]  Name: [name] MRN: [id number] Date of Birth: [birth year]

## 2017-10-18 MED FILL — GABAPENTIN 300 MG CAPSULE: 300 | 30 days supply | Qty: 30 | Fill #3

## 2017-10-19 DIAGNOSIS — E6609 Other obesity due to excess calories: Secondary | ICD-10-CM | POA: Diagnosis not present

## 2017-10-19 DIAGNOSIS — T7840XA Allergy, unspecified, initial encounter: Secondary | ICD-10-CM | POA: Diagnosis not present

## 2017-10-19 DIAGNOSIS — Z6830 Body mass index (BMI) 30.0-30.9, adult: Secondary | ICD-10-CM | POA: Diagnosis not present

## 2017-10-19 DIAGNOSIS — Z1389 Encounter for screening for other disorder: Secondary | ICD-10-CM | POA: Diagnosis not present

## 2017-10-19 DIAGNOSIS — J45909 Unspecified asthma, uncomplicated: Secondary | ICD-10-CM | POA: Diagnosis not present

## 2017-10-20 ENCOUNTER — Encounter (HOSPITAL_COMMUNITY): Payer: Self-pay

## 2017-10-20 ENCOUNTER — Ambulatory Visit (HOSPITAL_COMMUNITY): Payer: PRIVATE HEALTH INSURANCE

## 2017-10-20 DIAGNOSIS — R29898 Other symptoms and signs involving the musculoskeletal system: Secondary | ICD-10-CM | POA: Diagnosis not present

## 2017-10-20 DIAGNOSIS — M25572 Pain in left ankle and joints of left foot: Secondary | ICD-10-CM | POA: Diagnosis not present

## 2017-10-20 DIAGNOSIS — G8929 Other chronic pain: Secondary | ICD-10-CM

## 2017-10-20 DIAGNOSIS — M25562 Pain in left knee: Secondary | ICD-10-CM | POA: Diagnosis not present

## 2017-10-20 DIAGNOSIS — M6281 Muscle weakness (generalized): Secondary | ICD-10-CM | POA: Diagnosis not present

## 2017-10-20 NOTE — Therapy (Signed)
[place], [place], [address] Phone: [phone]   Fax:  [phone]  Physical Therapy Treatment  Patient Details  Name: [name] MRN: [id number] Date of Birth: 1965/10/07 Referring Provider: Geralynn Rile   Encounter Date: 10/20/2017  PT End of Session - 10/20/17 1745    Visit Number  3    Number of Visits  9    Date for PT Re-Evaluation  11/11/17    Authorization Type  Worker's Comp (8 visits approved per body part, 24 total visits)    Authorization Time Period  09/09/17 to 10/07/17 initially.  Extended 10/14/17-11/11/17    Authorization - Visit Number  3    Authorization - Number of Visits  16 pt approved for 8 tx for ankle and 8 tx for knee    PT Start Time  2229    PT Stop Time  1812    PT Time Calculation (min)  39 min    Activity Tolerance  Patient tolerated treatment well    Behavior During Therapy  WFL for tasks assessed/performed       Past Medical History:  Diagnosis Date  . Arthritis   . Asthma   . Chest pain    cath 2005 norm cors, repeat cath Feb 2011 normal cors and only minimally  elevated pulmonary pressures  . Crohn disease ([place])   . Diverticulosis   . GERD (gastroesophageal reflux disease)   . Hiatal hernia   . Hypertension   . Obesity   . Palpitations   . Sleep apnea sleep study 11/03/2009   mild-no cpap; does sometimes, doesnt use every night.    Past Surgical History:  Procedure Laterality Date  . ABDOMINAL HYSTERECTOMY    . ABDOMINAL HYSTERECTOMY    . ANKLE ARTHROSCOPY  06/01/2012   Procedure: ANKLE ARTHROSCOPY;  Surgeon: Colin Rhein, MD;  Location: [facility];  Service: Orthopedics;  Laterality: Left;  left ankle arthorsocpy with extensive debridement and gastroc slide  . CARDIAC CATHETERIZATION  11/22/2009 and 2005   WNL  . CESAREAN SECTION    . CHOLECYSTECTOMY  09/08/2006   lap. chole.  . CHONDROPLASTY  06/17/2012   Procedure: CHONDROPLASTY;  Surgeon:  Carole Civil, MD;  [facility];  Service: Orthopedics;  Laterality: Right;  right patella  . ESOPHAGOGASTRODUODENOSCOPY N/A 05/14/2015   Procedure: ESOPHAGOGASTRODUODENOSCOPY (EGD);  Surgeon: Rogene Houston, MD;  [facility];  Service: Endoscopy;  Laterality: N/A;  730  . INGUINAL HERNIA REPAIR  10/30/2008   right  . KNEE ARTHROSCOPY WITH LATERAL MENISECTOMY Left 12/23/2016   Procedure: LEFT KNEE ARTHROSCOPY WITH LATERAL MENISECTOMY;  Surgeon: Carole Civil, MD;  Location: [facility];  Service: Orthopedics;  Laterality: Left;  . KNEE ARTHROSCOPY WITH MEDIAL MENISECTOMY Left 12/23/2016   Procedure: LEFT KNEE ARTHROSCOPY WITH MEDIAL MENISECTOMY CHONDROPLASTY PATELLA  AND MEDIAL FEMORAL CONDYLE LEFT KNEE;  Surgeon: Carole Civil, MD;  Location: [facility];  Service: Orthopedics;  Laterality: Left;  . SHOULDER SURGERY     right x 2     There were no vitals filed for this visit.  Subjective Assessment - 10/20/17 1737    Subjective  Pt stated she has splitting headache at entrance.  Continues to have pain Lt knee and ankle, received injuction at MD earlier today.  Per MD continue to hold on knee and shoulder treatment.  Lt ankle pain scale 5/10.    Pertinent History  R knee scope  in 2013, L knee scope in March 2018    Patient Stated Goals  get better, get back as close to pain free as possible    Currently in Pain?  Yes    Pain Score  5     Pain Location  Ankle    Pain Orientation  Left    Pain Descriptors / Indicators  Aching;Tender    Pain Type  Chronic pain    Pain Onset  More than a month ago    Pain Frequency  Constant    Aggravating Factors   weight bearing    Pain Relieving Factors  rest, muscle relaxors    Effect of Pain on Daily Activities  increases                      [facility] Adult PT Treatment/Exercise - 10/20/17 0001      Manual Therapy   Manual Therapy  Soft tissue mobilization    Manual therapy comments  completed seperately from all  other skilled interventions    Soft tissue mobilization  to Lt ankle, focus on lateral aspect       Ankle Exercises: Seated   Heel Raises  15 reps    Toe Raise  15 reps    BAPS  Level 3;10 reps    Other Seated Ankle Exercises  RTB all directions 10x 5" holds               PT Short Term Goals - 10/14/17 1635      PT SHORT TERM GOAL #1   Title  Pt will be independent with HEP and perform consistently in order to maximize return to PLOF.    Time  2    Period  Weeks    Status  On-going      PT SHORT TERM GOAL #2   Title  Pt will have improved ankle DF ROM to at least 5 deg in order to decrease pain and maximize gait and balance.    Time  2    Period  Weeks    Status  Achieved      PT SHORT TERM GOAL #3   Title  Pt will report being able to sleep through the night without awakening due to knee or ankle pain in order to maximize recovery.    Time  2    Period  Weeks    Status  On-going        PT Long Term Goals - 10/14/17 1635      PT LONG TERM GOAL #1   Title  Pt will have improved SLS to 10sec on LLE to demo improved stability and proprioception of L ankle and maximize gait.    Period  Weeks    Status  On-going      PT LONG TERM GOAL #2   Title  Pt will have improved overall MMT of BLE by at least a 1/2 grade in order to decrease overall pain and allow pt to perform functional tasks with greater ease.     Period  Weeks    Status  On-going      PT LONG TERM GOAL #3   Title  Pt will report improved tolerance to standing and walking by 50% or > (to at least 40 mins to 1 hour) without 2/10 ankle and knee pain in order to maximize her function at work.    Period  Weeks    Status  On-going  PT LONG TERM GOAL #4   Title  Pt will be able to ascend/descend full flight of stairs reciprocally with no handrail and without knee or ankle pain in order to demo improved functional strength and maximize function at home and work.     Period  Weeks    Status  On-going             Plan - 10/20/17 1820    Clinical Impression Statement  Session focus on ankle mobility and strengthening.  Held knee tx today per MD and injection given earlier today, may begin knee tx next session based on pt pain.  Session complete in [place] only per knee pain today.  Added BAPS board to improve ankle mobility and theraband for strengthening all directions.  EOS complete with manual soft tissue mobilization and edema control for pain control, pt reports pain reduced to 2/10 following manual.  Knee and shoulder pain continue to be high 6-7/10.      Rehab Potential  Good    PT Frequency  2x / week    PT Duration  4 weeks    PT Next Visit Plan  Per evaluating Therapists POC, continue meniscus and shoulder special testing (based on pain following injections on 10/20/17 per MD); functional BLE strengthening, manual for pain/STM/ankle joint mobs for ROM, balance and stability/proprioceptive work.  follow up on results from MRI next session.  To extend therapy out 4 more weeks as unable to complete any sessions during previous cert period.        Patient will benefit from skilled therapeutic intervention in order to improve the following deficits and impairments:  Abnormal gait, Decreased activity tolerance, Decreased balance, Decreased mobility, Decreased range of motion, Decreased strength, Difficulty walking, Hypomobility, Increased muscle spasms, Increased fascial restricitons, Impaired UE functional use, Improper body mechanics, Postural dysfunction, Pain  Visit Diagnosis: Chronic pain of left knee  Pain in left ankle and joints of left foot  Muscle weakness (generalized)     Problem List Patient Active Problem List   Diagnosis Date Noted  . Derangement of anterior horn of lateral meniscus of left knee   . Derangement of posterior horn of medial meniscus of left knee   . Chest pain 04/25/2016  . Hypokalemia 04/25/2016  . Hyperglycemia 04/25/2016  . Diverticulitis of colon  07/17/2014  . Nausea without vomiting 07/17/2014  . Crohn disease ([place]) 07/17/2014  . Diverticulitis of colon without hemorrhage 08/10/2013  . History of arthroscopy of right knee 07/25/2012  . Difficulty in walking(719.7) 06/29/2012  . Weakness of right leg 06/29/2012  . Posterior tibial tendonitis 11/11/2011  . PTTD (posterior tibial tendon dysfunction) 11/11/2011  . Knee pain, right 11/11/2011  . Chest pain 07/16/2011  . Primary osteoarthritis of left knee 02/25/2010  . PAIN IN JOINT, MULTIPLE SITES 02/25/2010  . Essential hypertension 08/13/2009  . HIATAL HERNIA 08/13/2009  . PALPITATIONS 08/13/2009  . DYSPNEA 08/13/2009   [name], [place]; Page  [name] 10/20/2017, 6:26 PM  [place], [place], [address] Phone: [phone]   Fax:  [phone]  Name: [name] MRN: [id number] Date of Birth: 1966-06-26

## 2017-10-21 ENCOUNTER — Ambulatory Visit (HOSPITAL_COMMUNITY): Payer: PRIVATE HEALTH INSURANCE | Admitting: Physical Therapy

## 2017-10-26 ENCOUNTER — Ambulatory Visit (HOSPITAL_COMMUNITY): Payer: PRIVATE HEALTH INSURANCE

## 2017-10-26 ENCOUNTER — Encounter (HOSPITAL_COMMUNITY): Payer: Self-pay

## 2017-10-26 DIAGNOSIS — M6281 Muscle weakness (generalized): Secondary | ICD-10-CM | POA: Diagnosis not present

## 2017-10-26 DIAGNOSIS — M25562 Pain in left knee: Secondary | ICD-10-CM | POA: Diagnosis not present

## 2017-10-26 DIAGNOSIS — R29898 Other symptoms and signs involving the musculoskeletal system: Secondary | ICD-10-CM | POA: Diagnosis not present

## 2017-10-26 DIAGNOSIS — G8929 Other chronic pain: Secondary | ICD-10-CM

## 2017-10-26 DIAGNOSIS — M25572 Pain in left ankle and joints of left foot: Secondary | ICD-10-CM | POA: Diagnosis not present

## 2017-10-26 NOTE — Therapy (Signed)
[facility] [address] [place], [place], [address] Phone: [phone]   Fax:  [phone]  Physical Therapy Treatment  Patient Details  Name: [name] MRN: [id number] Date of Birth: Dec 23, 1965 Referring Provider: Geralynn Rile   Encounter Date: 10/26/2017  PT End of Session - 10/26/17 0816    Visit Number  4    Number of Visits  9    Date for PT Re-Evaluation  11/11/17    Authorization Type  Worker's Comp (8 visits approved per body part, 24 total visits)    Authorization Time Period  09/09/17 to 10/07/17 initially.  Extended 10/14/17-11/11/17    Authorization - Visit Number  4    Authorization - Number of Visits  16 pt approved for 8 tx for ankle and 8 tx for knee    PT Start Time  0815    PT Stop Time  0859    PT Time Calculation (min)  44 min    Activity Tolerance  Patient tolerated treatment well    Behavior During Therapy  [facility] for tasks assessed/performed       Past Medical History:  Diagnosis Date  . Arthritis   . Asthma   . Chest pain    cath 2005 norm cors, repeat cath Feb 2011 normal cors and only minimally  elevated pulmonary pressures  . Crohn disease ([place])   . Diverticulosis   . GERD (gastroesophageal reflux disease)   . Hiatal hernia   . Hypertension   . Obesity   . Palpitations   . Sleep apnea sleep study 11/03/2009   mild-no cpap; does sometimes, doesnt use every night.    Past Surgical History:  Procedure Laterality Date  . ABDOMINAL HYSTERECTOMY    . ABDOMINAL HYSTERECTOMY    . ANKLE ARTHROSCOPY  06/01/2012   Procedure: ANKLE ARTHROSCOPY;  Surgeon: Colin Rhein, MD;  Location: [facility];  Service: Orthopedics;  Laterality: Left;  left ankle arthorsocpy with extensive debridement and gastroc slide  . CARDIAC CATHETERIZATION  11/22/2009 and 2005   WNL  . CESAREAN SECTION    . CHOLECYSTECTOMY  09/08/2006   lap. chole.  . CHONDROPLASTY  06/17/2012   Procedure: CHONDROPLASTY;  Surgeon:  Carole Civil, MD;  [facility];  Service: Orthopedics;  Laterality: Right;  right patella  . ESOPHAGOGASTRODUODENOSCOPY N/A 05/14/2015   Procedure: ESOPHAGOGASTRODUODENOSCOPY (EGD);  Surgeon: Rogene Houston, MD;  [facility];  Service: Endoscopy;  Laterality: N/A;  730  . INGUINAL HERNIA REPAIR  10/30/2008   right  . KNEE ARTHROSCOPY WITH LATERAL MENISECTOMY Left 12/23/2016   Procedure: LEFT KNEE ARTHROSCOPY WITH LATERAL MENISECTOMY;  Surgeon: Carole Civil, MD;  [facility];  Service: Orthopedics;  Laterality: Left;  . KNEE ARTHROSCOPY WITH MEDIAL MENISECTOMY Left 12/23/2016   Procedure: LEFT KNEE ARTHROSCOPY WITH MEDIAL MENISECTOMY CHONDROPLASTY PATELLA  AND MEDIAL FEMORAL CONDYLE LEFT KNEE;  Surgeon: Carole Civil, MD;  [facility];  Service: Orthopedics;  Laterality: Left;  . SHOULDER SURGERY     right x 2     There were no vitals filed for this visit.  Subjective Assessment - 10/26/17 0816    Subjective  Pt states that she has had a tough weekend at work. She's been having headaches and her eyes have been sensitive to light. Pt states that her ankle is hurting more when she's walking on it. Her knee is stiff and tight in the mornings.  Pertinent History  R knee scope in 2013, L knee scope in March 2018    Patient Stated Goals  get better, get back as close to pain free as possible    Currently in Pain?  Yes    Pain Score  4     Pain Location  Ankle knee and shoulder    Pain Orientation  Left    Pain Descriptors / Indicators  Aching;Tender    Pain Type  Chronic pain    Pain Onset  More than a month ago    Pain Frequency  Constant    Aggravating Factors   weight bearing    Pain Relieving Factors  rest, muscle relaxors,    Effect of Pain on Daily Activities  increases            [facility] PT Assessment - 10/26/17 0001      AROM   Left Knee Extension  0    Left Knee Flexion  110        [facility] Adult PT Treatment/Exercise - 10/26/17 0001       Exercises   Exercises  Knee/Hip      Knee/Hip Exercises: Stretches   Passive Hamstring Stretch  Left;3 reps;30 seconds    Passive Hamstring Stretch Limitations  supine with rope      Knee/Hip Exercises: Supine   Short Arc Quad Sets  Left;10 reps    Heel Slides  Left;15 reps    Heel Slides Limitations  supine with rope      Manual Therapy   Manual Therapy  Soft tissue mobilization    Manual therapy comments  completed seperately from all other skilled interventions    Soft tissue mobilization  to Lt ankle, focus on lateral aspect; lateral L knee specifically to distal VLO and lateral HS      Ankle Exercises: Seated   BAPS  Level 3;10 reps            PT Education - 10/26/17 0845    Education provided  Yes    Education Details  updated HEP for knee; exercise technique    Person(s) Educated  Patient    Methods  Explanation;Demonstration;Handout    Comprehension  Verbalized understanding;Returned demonstration       PT Short Term Goals - 10/14/17 1635      PT SHORT TERM GOAL #1   Title  Pt will be independent with HEP and perform consistently in order to maximize return to PLOF.    Time  2    Period  Weeks    Status  On-going      PT SHORT TERM GOAL #2   Title  Pt will have improved ankle DF ROM to at least 5 deg in order to decrease pain and maximize gait and balance.    Time  2    Period  Weeks    Status  Achieved      PT SHORT TERM GOAL #3   Title  Pt will report being able to sleep through the night without awakening due to knee or ankle pain in order to maximize recovery.    Time  2    Period  Weeks    Status  On-going        PT Long Term Goals - 10/14/17 1635      PT LONG TERM GOAL #1   Title  Pt will have improved SLS to 10sec on LLE to demo improved stability and proprioception of L ankle and maximize gait.  Period  Weeks    Status  On-going      PT LONG TERM GOAL #2   Title  Pt will have improved overall MMT of BLE by at least a 1/2  grade in order to decrease overall pain and allow pt to perform functional tasks with greater ease.     Period  Weeks    Status  On-going      PT LONG TERM GOAL #3   Title  Pt will report improved tolerance to standing and walking by 50% or > (to at least 40 mins to 1 hour) without 2/10 ankle and knee pain in order to maximize her function at work.    Period  Weeks    Status  On-going      PT LONG TERM GOAL #4   Title  Pt will be able to ascend/descend full flight of stairs reciprocally with no handrail and without knee or ankle pain in order to demo improved functional strength and maximize function at home and work.     Period  Weeks    Status  On-going            Plan - 10/26/17 0945    Clinical Impression Statement  Pt presents to therapy with R knee brace on this date. She states that the injection in her L knee has helped some. She states that her MRIs showed that she may have torn her medial and lateral menisci again, but these areas could just be flared up from her past surgeries. Dr. Stann Mainland told her to go ahead and try and do therapy on her shoulder and knee as well in order to r/o whether she needs surgery again. She f/u with Dr. Stann Mainland in 4 weeks. Pt had audible popping/clicking during SAQ with some pain; recommend holding during next session. Ended session with BAPS for ankle mobility and manual to L lateral ankle and L knee for pain control and soft tissue restrictions. Pt stated that her pain was still about 4/10 at EOS. PT spoke with pt's referring physician around her initial evaluation about getting her shoulder referral changed to OT in order to streamline pt's care; they had agreed to submit this referral but have not yet done it. Contacting pt's MD today to get this OT referral entered in order to be able to address pt's L shoulder, L ankle, and L knee all at the same time. PT sessions going forward should focus on the L ankle and L knee once the OT order for her shoulder is  approved and scanned into the system.     Rehab Potential  Good    PT Frequency  2x / week    PT Duration  4 weeks    PT Next Visit Plan  Can begin L knee treatments; functional BLE strengthening, manual for pain/STM/ankle joint mobs for ROM, balance and stability/proprioceptive work. f/u on OT shoulder referral    Consulted and Agree with Plan of Care  Patient       Patient will benefit from skilled therapeutic intervention in order to improve the following deficits and impairments:  Abnormal gait, Decreased activity tolerance, Decreased balance, Decreased mobility, Decreased range of motion, Decreased strength, Difficulty walking, Hypomobility, Increased muscle spasms, Increased fascial restricitons, Impaired UE functional use, Improper body mechanics, Postural dysfunction, Pain  Visit Diagnosis: Pain in left ankle and joints of left foot  Chronic pain of left knee  Other symptoms and signs involving the musculoskeletal system  Muscle weakness (generalized)  Problem List Patient Active Problem List   Diagnosis Date Noted  . Derangement of anterior horn of lateral meniscus of left knee   . Derangement of posterior horn of medial meniscus of left knee   . Chest pain 04/25/2016  . Hypokalemia 04/25/2016  . Hyperglycemia 04/25/2016  . Diverticulitis of colon 07/17/2014  . Nausea without vomiting 07/17/2014  . Crohn disease ([place]) 07/17/2014  . Diverticulitis of colon without hemorrhage 08/10/2013  . History of arthroscopy of right knee 07/25/2012  . Difficulty in walking(719.7) 06/29/2012  . Weakness of right leg 06/29/2012  . Posterior tibial tendonitis 11/11/2011  . PTTD (posterior tibial tendon dysfunction) 11/11/2011  . Knee pain, right 11/11/2011  . Chest pain 07/16/2011  . Primary osteoarthritis of left knee 02/25/2010  . PAIN IN JOINT, MULTIPLE SITES 02/25/2010  . Essential hypertension 08/13/2009  . HIATAL HERNIA 08/13/2009  . PALPITATIONS 08/13/2009  . DYSPNEA  08/13/2009        Geraldine Solar PT, DPT  [facility] [address] [place], [place], [address] Phone: [phone]   Fax:  [phone]  Name: [name] MRN: [id number] Date of Birth: Aug 14, 1966

## 2017-10-26 NOTE — Patient Instructions (Signed)
  HEEL SLIDES - LONG SIT WITH TOWEL AND BELT  While in a sitting position, place a small hand towel under your heel. Next, loop a belt, towel or bed sheet around your foot and pull your knee into a bend position as your foot slides towards your buttock. Hold a gentle stretch and then return back to original position.  Perform 1-2x/day, 10-15 reps each, holding for a few seconds at end range   HAMSTRING STRETCH WITH TOWEL  While lying down on your back, hook a towel or strap under  your foot and draw up your leg until a stretch is felt under your leg. calf area.  Keep your knee in a straightened position during the stretch.  Perform 1-2x/day, 3-5 stretches holding for 30-60 seconds each

## 2017-10-27 ENCOUNTER — Ambulatory Visit (HOSPITAL_COMMUNITY): Payer: PRIVATE HEALTH INSURANCE

## 2017-10-27 ENCOUNTER — Encounter (HOSPITAL_COMMUNITY): Payer: Self-pay

## 2017-10-27 DIAGNOSIS — M6281 Muscle weakness (generalized): Secondary | ICD-10-CM | POA: Diagnosis not present

## 2017-10-27 DIAGNOSIS — M25572 Pain in left ankle and joints of left foot: Secondary | ICD-10-CM

## 2017-10-27 DIAGNOSIS — M25562 Pain in left knee: Secondary | ICD-10-CM

## 2017-10-27 DIAGNOSIS — R29898 Other symptoms and signs involving the musculoskeletal system: Secondary | ICD-10-CM | POA: Diagnosis not present

## 2017-10-27 DIAGNOSIS — G8929 Other chronic pain: Secondary | ICD-10-CM

## 2017-10-27 NOTE — Patient Instructions (Signed)
Bridge    Lie back, legs bent. Inhale, pressing hips up. Keeping ribs in, lengthen lower back. Exhale, rolling down along spine from top. Repeat 10-20 times. Do 1-2 sessions per day.  http://pm.exer.us/55   Copyright  VHI. All rights reserved.   Abduction    Lift leg up toward ceiling. Return.  Repeat 10-20 times each leg. Do 1-2 sessions per day.  http://gt2.exer.us/386   Copyright  VHI. All rights reserved.   Extension    Lift leg up in the air and bring it back down.  Repeat with other leg. Repeat 10-20 times. Do 1-2 sessions per day.  http://gt2.exer.us/388   Copyright  VHI. All rights reserved.   Toe / Heel Raise (Standing)    Standing with support, raise heels, then rock back on heels and raise toes. Repeat 10-20 times.  Copyright  VHI. All rights reserved.

## 2017-10-27 NOTE — Therapy (Signed)
[facility] [address] [place], [place], [address] Phone: [phone]   Fax:  [phone]  Physical Therapy Treatment  Patient Details  Name: [name] MRN: [id number] Date of Birth: [birth year] Referring Provider: Geralynn Rile   Encounter Date: 10/27/2017  PT End of Session - 10/27/17 0829    Visit Number  5    Number of Visits  9    Date for PT Re-Evaluation  11/11/17    Authorization Type  Worker's Comp (8 visits approved per body part, 24 total visits)    Authorization Time Period  09/09/17 to 10/07/17 initially.  Extended 10/14/17-11/11/17    Authorization - Visit Number  5    Authorization - Number of Visits  16    PT Start Time  0820    PT Stop Time  0858    PT Time Calculation (min)  38 min    Activity Tolerance  Patient tolerated treatment well    Behavior During Therapy  WFL for tasks assessed/performed       Past Medical History:  Diagnosis Date  . Arthritis   . Asthma   . Chest pain    cath 2005 norm cors, repeat cath Feb 2011 normal cors and only minimally  elevated pulmonary pressures  . Crohn disease ([place])   . Diverticulosis   . GERD (gastroesophageal reflux disease)   . Hiatal hernia   . Hypertension   . Obesity   . Palpitations   . Sleep apnea sleep study 11/03/2009   mild-no cpap; does sometimes, doesnt use every night.    Past Surgical History:  Procedure Laterality Date  . ABDOMINAL HYSTERECTOMY    . ABDOMINAL HYSTERECTOMY    . ANKLE ARTHROSCOPY  06/01/2012   Procedure: ANKLE ARTHROSCOPY;  Surgeon: Colin Rhein, MD;  Location: [facility];  Service: Orthopedics;  Laterality: Left;  left ankle arthorsocpy with extensive debridement and gastroc slide  . CARDIAC CATHETERIZATION  11/22/2009 and 2005   WNL  . CESAREAN SECTION    . CHOLECYSTECTOMY  09/08/2006   lap. chole.  . CHONDROPLASTY  06/17/2012   Procedure: CHONDROPLASTY;  Surgeon: Carole Civil, MD;  [facility];   Service: Orthopedics;  Laterality: Right;  right patella  . ESOPHAGOGASTRODUODENOSCOPY N/A 05/14/2015   Procedure: ESOPHAGOGASTRODUODENOSCOPY (EGD);  Surgeon: Rogene Houston, MD;  [facility];  Service: Endoscopy;  Laterality: N/A;  730  . INGUINAL HERNIA REPAIR  10/30/2008   right  . KNEE ARTHROSCOPY WITH LATERAL MENISECTOMY Left 12/23/2016   Procedure: LEFT KNEE ARTHROSCOPY WITH LATERAL MENISECTOMY;  Surgeon: Carole Civil, MD;  [facility];  Service: Orthopedics;  Laterality: Left;  . KNEE ARTHROSCOPY WITH MEDIAL MENISECTOMY Left 12/23/2016   Procedure: LEFT KNEE ARTHROSCOPY WITH MEDIAL MENISECTOMY CHONDROPLASTY PATELLA  AND MEDIAL FEMORAL CONDYLE LEFT KNEE;  Surgeon: Carole Civil, MD;  [facility];  Service: Orthopedics;  Laterality: Left;  . SHOULDER SURGERY     right x 2     There were no vitals filed for this visit.  Subjective Assessment - 10/27/17 0825    Subjective  Pt arrived with brace on Rt knee, stated Lt knee pain scale 3/10 with a little higher ankle pain when weight bearing.      Pertinent History  R knee scope in 2013, L knee scope in March 2018    Patient Stated Goals  get better, get back as close to pain free as possible  Currently in Pain?  Yes    Pain Score  3     Pain Location  Leg Lt knee and ankle    Pain Orientation  Left    Pain Descriptors / Indicators  Dull;Aching    Pain Type  Chronic pain    Pain Onset  More than a month ago    Pain Frequency  Constant    Aggravating Factors   weight bearing    Pain Relieving Factors  rest, muscle relaxors    Effect of Pain on Daily Activities  increases                      [facility] Adult PT Treatment/Exercise - 10/27/17 0001      Knee/Hip Exercises: Standing   Heel Raises  10 reps      Knee/Hip Exercises: Supine   Short Arc Quad Sets  15 reps      Knee/Hip Exercises: Sidelying   Hip ABduction  15 reps      Knee/Hip Exercises: Prone   Hamstring Curl  10 reps Lt 2#,  c/o grinding/clicking    Hip Extension  Both;10 reps      Ankle Exercises: Seated   BAPS  Level 3;10 reps             PT Education - 10/26/17 0845    Education provided  Yes    Education Details  updated HEP for knee; exercise technique    Person(s) Educated  Patient    Methods  Explanation;Demonstration;Handout    Comprehension  Verbalized understanding;Returned demonstration       PT Short Term Goals - 10/14/17 1635      PT SHORT TERM GOAL #1   Title  Pt will be independent with HEP and perform consistently in order to maximize return to PLOF.    Time  2    Period  Weeks    Status  On-going      PT SHORT TERM GOAL #2   Title  Pt will have improved ankle DF ROM to at least 5 deg in order to decrease pain and maximize gait and balance.    Time  2    Period  Weeks    Status  Achieved      PT SHORT TERM GOAL #3   Title  Pt will report being able to sleep through the night without awakening due to knee or ankle pain in order to maximize recovery.    Time  2    Period  Weeks    Status  On-going        PT Long Term Goals - 10/14/17 1635      PT LONG TERM GOAL #1   Title  Pt will have improved SLS to 10sec on LLE to demo improved stability and proprioception of L ankle and maximize gait.    Period  Weeks    Status  On-going      PT LONG TERM GOAL #2   Title  Pt will have improved overall MMT of BLE by at least a 1/2 grade in order to decrease overall pain and allow pt to perform functional tasks with greater ease.     Period  Weeks    Status  On-going      PT LONG TERM GOAL #3   Title  Pt will report improved tolerance to standing and walking by 50% or > (to at least 40 mins to 1 hour) without 2/10 ankle and knee pain  in order to maximize her function at work.    Period  Weeks    Status  On-going      PT LONG TERM GOAL #4   Title  Pt will be able to ascend/descend full flight of stairs reciprocally with no handrail and without knee or ankle pain in order to  demo improved functional strength and maximize function at home and work.     Period  Weeks    Status  On-going            Plan - 10/27/17 0847    Clinical Impression Statement  Session focus on LE strengthening with increased focus on hip/knee strengthening.  Majority of session focus with [facility] establishing HEP with focus on gluteal strengthening.  Pt with good tolerance to tx with no reports of increased pain or noted audible popping/clicking during SAQ, did c/o of clicking during hamstring curls and heel raises.  Pt has not received signed order for OT to begin with shoulder yet.      Rehab Potential  Good    PT Frequency  2x / week    PT Duration  4 weeks    PT Treatment/Interventions  ADLs/Self Care Home Management;Cryotherapy;Electrical Stimulation;Moist Heat;Traction;Gait training;Stair training;Functional mobility training;Therapeutic activities;Therapeutic exercise;Balance training;Neuromuscular re-education;Cognitive remediation;Patient/family education;Manual techniques;Passive range of motion;Dry needling;Energy conservation;Taping    PT Next Visit Plan  Continue strengthening for Lt knee and ankle and progress to functional BLE strengthening, manual for pain/STM/ankle joint mobs for ROM, balance and stability/proprioceptive work. f/u on OT shoulder referral    PT Home Exercise Plan  eval: wall walking, calf stretching; 10/27/17:  bridge, abduction and hip extension, heel raises       Patient will benefit from skilled therapeutic intervention in order to improve the following deficits and impairments:  Abnormal gait, Decreased activity tolerance, Decreased balance, Decreased mobility, Decreased range of motion, Decreased strength, Difficulty walking, Hypomobility, Increased muscle spasms, Increased fascial restricitons, Impaired UE functional use, Improper body mechanics, Postural dysfunction, Pain  Visit Diagnosis: Pain in left ankle and joints of left foot  Chronic pain of left  knee  Muscle weakness (generalized)     Problem List Patient Active Problem List   Diagnosis Date Noted  . Derangement of anterior horn of lateral meniscus of left knee   . Derangement of posterior horn of medial meniscus of left knee   . Chest pain 04/25/2016  . Hypokalemia 04/25/2016  . Hyperglycemia 04/25/2016  . Diverticulitis of colon 07/17/2014  . Nausea without vomiting 07/17/2014  . Crohn disease ([place]) 07/17/2014  . Diverticulitis of colon without hemorrhage 08/10/2013  . History of arthroscopy of right knee 07/25/2012  . Difficulty in walking(719.7) 06/29/2012  . Weakness of right leg 06/29/2012  . Posterior tibial tendonitis 11/11/2011  . PTTD (posterior tibial tendon dysfunction) 11/11/2011  . Knee pain, right 11/11/2011  . Chest pain 07/16/2011  . Primary osteoarthritis of left knee 02/25/2010  . PAIN IN JOINT, MULTIPLE SITES 02/25/2010  . Essential hypertension 08/13/2009  . HIATAL HERNIA 08/13/2009  . PALPITATIONS 08/13/2009  . DYSPNEA 08/13/2009   Ihor Austin, [place]; [place]  Aldona Lento 10/27/2017, 10:15 AM  [facility] [place], [place], [address] Phone: [phone]   Fax:  [phone]  Name: [name] MRN: [id number] Date of Birth: 04/18/1966

## 2017-10-29 ENCOUNTER — Encounter: Payer: Self-pay | Admitting: Orthopedic Surgery

## 2017-10-29 ENCOUNTER — Ambulatory Visit: Payer: Self-pay | Admitting: Orthopedic Surgery

## 2017-10-29 VITALS — BP 143/94 | HR 77 | Ht 71.5 in | Wt 217.0 lb

## 2017-10-29 DIAGNOSIS — M1711 Unilateral primary osteoarthritis, right knee: Secondary | ICD-10-CM

## 2017-10-29 MED ORDER — DICLOFENAC SODIUM 1 % TD GEL: 4.0000 g | Freq: Four times a day (QID) | TRANSDERMAL | 5 refills | Status: DC

## 2017-10-29 MED FILL — DICLOFENAC SODIUM 1% GEL: 1 | 18 days supply | Qty: 300 | Fill #0

## 2017-10-29 NOTE — Progress Notes (Signed)
Progress Note   Patient ID: [name], female   DOB: [birth year], 52 y.o.   MRN: [id number]  Chief Complaint  Patient presents with  . Knee Pain    right    52 year old female with history of prior left and right knee surgery presents for possible evaluation for right total knee which she is ready to schedule.  However, she injured her left knee at work.  She said her foot caught the wheel of the computer rolling device and she fell back twisted her left knee and since that time his had catching and popping with pain over the anterior patellar tendon and superolateral patella, she also injured her left shoulder and MRI showed a labral tear  The MRI of her knee on the left showed questionable re-tear of the meniscus versus arthritis and extrusion of the meniscus  Right knee she complains of unusual episodic medial knee pain which is catching and grabbing as well.     Review of Systems  Constitutional: Negative for fever.  Musculoskeletal: Positive for joint pain.  Skin: Negative.    Current Meds  Medication Sig  . albuterol (PROVENTIL) (2.5 MG/3ML) 0.083% nebulizer solution Take 2.5 mg by nebulization every 6 (six) hours as needed. For shortness of breath  . albuterol (VENTOLIN HFA) 108 (90 Base) MCG/ACT inhaler Inhale 1-2 puffs into the lungs every 6 (six) hours as needed for wheezing or shortness of breath.  Marland Kitchen amLODipine (NORVASC) 5 MG tablet Take 5 mg by mouth at bedtime.  . cyclobenzaprine (FLEXERIL) 10 MG tablet Take 1 tablet (10 mg total) by mouth 3 (three) times daily as needed. For muscle spasms  . cyclobenzaprine (FLEXERIL) 10 MG tablet TAKE ONE TABLET BY MOUTH 3 TIMES DAILY AS NEEDED FOR MUSCLE SPASM(S).  Marland Kitchen EPINEPHrine (EPIPEN) 0.3 mg/0.3 mL SOAJ injection Inject 0.3 mLs (0.3 mg total) into the muscle once.  . gabapentin (NEURONTIN) 300 MG capsule Take 1 capsule (300 mg total) by mouth at bedtime.  . IBU 800 MG tablet TAKE (1) TABLET BY MOUTH (3) TIMES DAILY.  Marland Kitchen losartan  (COZAAR) 100 MG tablet Take 100 mg by mouth at bedtime.   . methocarbamol (ROBAXIN) 500 MG tablet Take 1 tablet (500 mg total) by mouth at bedtime as needed for muscle spasms.  . pantoprazole (PROTONIX) 40 MG tablet TAKE (1) TABLET BY MOUTH TWICE DAILY BEFORE A MEAL.  . [DISCONTINUED] cetirizine (ZYRTEC) 10 MG chewable tablet Chew 10 mg by mouth daily.    Allergies  Allergen Reactions  . Other Shortness Of Breath    Lemon grass  . Wasp Venom Anaphylaxis and Shortness Of Breath  . Hydromorphone Hcl Nausea And Vomiting  . Omnipaque [Iohexol] Hives and Nausea Only    Pt. Was premedicated with emergent protocol.  . Pollen Extract Swelling  . Adhesive [Tape] Rash     BP (!) 143/94   Pulse 77   Ht 5' 11.5" (1.816 m)   Wt 217 lb (98.4 kg)   BMI 29.84 kg/m   Physical Exam  Constitutional: She is oriented to person, place, and time. She appears well-developed and well-nourished.  Musculoskeletal:       Arms:      Legs: Bilateral pes planus  Neurological: She is alert and oriented to person, place, and time.  Psychiatric: She has a normal mood and affect. Judgment normal.  Vitals reviewed.    Medical decision-making Encounter Diagnosis  Name Primary?  . Primary osteoarthritis of right knee Yes    At this  point it would be prudent to allow her to resolve the issues with her left shoulder and knee  She will get back to [place] in 3 months or sooner if that resolves quicker.  Meds ordered this encounter  Medications  . diclofenac sodium (VOLTAREN) 1 % GEL    Sig: Apply 4 g topically 4 (four) times daily.    Dispense:  3 Tube    Refill:  5     Arther Abbott, MD 10/29/2017 11:32 AM

## 2017-11-01 MED FILL — AMLODIPINE BESYLATE 5 MG TA: 5 | 90 days supply | Qty: 90 | Fill #1

## 2017-11-01 MED FILL — LOSARTAN POTASSIUM 100 MG T: 100 | 90 days supply | Qty: 90 | Fill #1

## 2017-11-03 ENCOUNTER — Encounter (HOSPITAL_COMMUNITY): Payer: Self-pay

## 2017-11-03 ENCOUNTER — Ambulatory Visit (HOSPITAL_COMMUNITY): Payer: PRIVATE HEALTH INSURANCE

## 2017-11-03 ENCOUNTER — Ambulatory Visit (HOSPITAL_COMMUNITY): Payer: PRIVATE HEALTH INSURANCE | Admitting: Occupational Therapy

## 2017-11-04 ENCOUNTER — Ambulatory Visit (HOSPITAL_COMMUNITY): Payer: PRIVATE HEALTH INSURANCE

## 2017-11-04 ENCOUNTER — Encounter (HOSPITAL_COMMUNITY): Payer: Self-pay

## 2017-11-04 DIAGNOSIS — M25572 Pain in left ankle and joints of left foot: Secondary | ICD-10-CM

## 2017-11-04 DIAGNOSIS — R29898 Other symptoms and signs involving the musculoskeletal system: Secondary | ICD-10-CM | POA: Diagnosis not present

## 2017-11-04 DIAGNOSIS — M6281 Muscle weakness (generalized): Secondary | ICD-10-CM | POA: Diagnosis not present

## 2017-11-04 DIAGNOSIS — G8929 Other chronic pain: Secondary | ICD-10-CM | POA: Diagnosis not present

## 2017-11-04 DIAGNOSIS — M25562 Pain in left knee: Secondary | ICD-10-CM

## 2017-11-04 NOTE — Therapy (Signed)
[place], [place], [address] Phone: [phone]   Fax:  [phone]  Physical Therapy Treatment  Patient Details  Name: KALLAN BISCHOFF MRN: [id number] Date of Birth: 05/14/1966 Referring Provider: Geralynn Rile   Encounter Date: 11/04/2017  PT End of Session - 11/04/17 0832    Visit Number  6    Number of Visits  9    Date for PT Re-Evaluation  11/11/17    Authorization Type  Worker's Comp (8 visits approved per body part, 24 total visits)    Authorization Time Period  09/09/17 to 10/07/17 initially.  Extended 10/14/17-11/11/17    Authorization - Visit Number  6    Authorization - Number of Visits  16    PT Start Time  [phone] pt late    PT Stop Time  0901    PT Time Calculation (min)  32 min    Activity Tolerance  Patient tolerated treatment well    Behavior During Therapy  [facility] for tasks assessed/performed       Past Medical History:  Diagnosis Date  . Arthritis   . Asthma   . Chest pain    cath 2005 norm cors, repeat cath Feb 2011 normal cors and only minimally  elevated pulmonary pressures  . Crohn disease ([place])   . Diverticulosis   . GERD (gastroesophageal reflux disease)   . Hiatal hernia   . Hypertension   . Obesity   . Palpitations   . Sleep apnea sleep study 11/03/2009   mild-no cpap; does sometimes, doesnt use every night.    Past Surgical History:  Procedure Laterality Date  . ABDOMINAL HYSTERECTOMY    . ABDOMINAL HYSTERECTOMY    . ANKLE ARTHROSCOPY  06/01/2012   Procedure: ANKLE ARTHROSCOPY;  Surgeon: Colin Rhein, MD;  Location: [facility];  Service: Orthopedics;  Laterality: Left;  left ankle arthorsocpy with extensive debridement and gastroc slide  . CARDIAC CATHETERIZATION  11/22/2009 and 2005   WNL  . CESAREAN SECTION    . CHOLECYSTECTOMY  09/08/2006   lap. chole.  . CHONDROPLASTY  06/17/2012   Procedure: CHONDROPLASTY;  Surgeon: Carole Civil, MD;  [facility];  Service: Orthopedics;  Laterality: Right;  right patella  . ESOPHAGOGASTRODUODENOSCOPY N/A 05/14/2015   Procedure: ESOPHAGOGASTRODUODENOSCOPY (EGD);  Surgeon: Rogene Houston, MD;  [facility];  Service: Endoscopy;  Laterality: N/A;  730  . INGUINAL HERNIA REPAIR  10/30/2008   right  . KNEE ARTHROSCOPY WITH LATERAL MENISECTOMY Left 12/23/2016   Procedure: LEFT KNEE ARTHROSCOPY WITH LATERAL MENISECTOMY;  Surgeon: Carole Civil, MD;  [facility];  Service: Orthopedics;  Laterality: Left;  . KNEE ARTHROSCOPY WITH MEDIAL MENISECTOMY Left 12/23/2016   Procedure: LEFT KNEE ARTHROSCOPY WITH MEDIAL MENISECTOMY CHONDROPLASTY PATELLA  AND MEDIAL FEMORAL CONDYLE LEFT KNEE;  Surgeon: Carole Civil, MD;  [facility];  Service: Orthopedics;  Laterality: Left;  . SHOULDER SURGERY     right x 2     There were no vitals filed for this visit.  Subjective Assessment - 11/04/17 0831    Subjective  Pt states that her L knee and ankle are bothering her this morning.     Pertinent History  R knee scope in 2013, L knee scope in March 2018    Patient Stated Goals  get better, get back as close to pain free as possible    Currently in Pain?  Yes  Pain Score  5     Pain Location  Leg knee and ankle    Pain Orientation  Left    Pain Descriptors / Indicators  Aching;Dull    Pain Type  Chronic pain    Pain Onset  More than a month ago    Pain Frequency  Constant    Aggravating Factors   WB    Pain Relieving Factors  rest, muscle relaxors    Effect of Pain on Daily Activities  increases            [facility] Adult PT Treatment/Exercise - 11/04/17 0001      Knee/Hip Exercises: Stretches   Passive Hamstring Stretch  Left;3 reps;30 seconds    Passive Hamstring Stretch Limitations  standing on 12" step    Gastroc Stretch  Both;3 reps;30 seconds    Gastroc Stretch Limitations  slant board      Knee/Hip Exercises: Machines for Strengthening   Cybex Knee Extension  3plates, BLE  2x10    Hip Cybex  BLE, 2x10 at 25deg      Knee/Hip Exercises: Standing   Hip Abduction  Both;10 reps    Abduction Limitations  GTB      Ankle Exercises: Standing   BAPS  Standing;Level 3;10 reps DF/PF, inv/ev, CW/CCW    Heel Raises  15 reps          PT Education - 11/04/17 [phone]    Education provided  Yes    Person(s) Educated  Patient    Methods  Explanation;Demonstration    Comprehension  Verbalized understanding;Returned demonstration       PT Short Term Goals - 10/14/17 1635      PT SHORT TERM GOAL #1   Title  Pt will be independent with HEP and perform consistently in order to maximize return to PLOF.    Time  2    Period  Weeks    Status  On-going      PT SHORT TERM GOAL #2   Title  Pt will have improved ankle DF ROM to at least 5 deg in order to decrease pain and maximize gait and balance.    Time  2    Period  Weeks    Status  Achieved      PT SHORT TERM GOAL #3   Title  Pt will report being able to sleep through the night without awakening due to knee or ankle pain in order to maximize recovery.    Time  2    Period  Weeks    Status  On-going        PT Long Term Goals - 10/14/17 1635      PT LONG TERM GOAL #1   Title  Pt will have improved SLS to 10sec on LLE to demo improved stability and proprioception of L ankle and maximize gait.    Period  Weeks    Status  On-going      PT LONG TERM GOAL #2   Title  Pt will have improved overall MMT of BLE by at least a 1/2 grade in order to decrease overall pain and allow pt to perform functional tasks with greater ease.     Period  Weeks    Status  On-going      PT LONG TERM GOAL #3   Title  Pt will report improved tolerance to standing and walking by 50% or > (to at least 40 mins to 1 hour) without 2/10 ankle and knee pain  in order to maximize her function at work.    Period  Weeks    Status  On-going      PT LONG TERM GOAL #4   Title  Pt will be able to ascend/descend full flight of stairs  reciprocally with no handrail and without knee or ankle pain in order to demo improved functional strength and maximize function at home and work.     Period  Weeks    Status  On-going            Plan - 11/04/17 0900    Clinical Impression Statement  Session limited as pt arrived late. Focus of today's session on ankle mobility and functional strengthening. Introduced pt to power tower leg press and cybex machines in order to strengthen in NWB position. Pt still has some knee discomfort with these though it was improved compared to in WB. Educated pt that she may experience some muscle soreness following strengthening today. Continue as planned, progressing as able and tolerated.    Rehab Potential  Good    PT Frequency  2x / week    PT Duration  4 weeks    PT Treatment/Interventions  ADLs/Self Care Home Management;Cryotherapy;Electrical Stimulation;Moist Heat;Traction;Gait training;Stair training;Functional mobility training;Therapeutic activities;Therapeutic exercise;Balance training;Neuromuscular re-education;Cognitive remediation;Patient/family education;Manual techniques;Passive range of motion;Dry needling;Energy conservation;Taping    PT Next Visit Plan  Continue strengthening for Lt knee and ankle and progress to functional BLE strengthening, manual for pain/STM/ankle joint mobs for ROM, balance and stability/proprioceptive work    PT Home Exercise Plan  eval: wall walking, calf stretching; 10/27/17:  bridge, abduction and hip extension, heel raises    Consulted and Agree with Plan of Care  Patient       Patient will benefit from skilled therapeutic intervention in order to improve the following deficits and impairments:  Abnormal gait, Decreased activity tolerance, Decreased balance, Decreased mobility, Decreased range of motion, Decreased strength, Difficulty walking, Hypomobility, Increased muscle spasms, Increased fascial restricitons, Impaired UE functional use, Improper body  mechanics, Postural dysfunction, Pain  Visit Diagnosis: Pain in left ankle and joints of left foot  Chronic pain of left knee  Muscle weakness (generalized)  Other symptoms and signs involving the musculoskeletal system     Problem List Patient Active Problem List   Diagnosis Date Noted  . Derangement of anterior horn of lateral meniscus of left knee   . Derangement of posterior horn of medial meniscus of left knee   . Chest pain 04/25/2016  . Hypokalemia 04/25/2016  . Hyperglycemia 04/25/2016  . Diverticulitis of colon 07/17/2014  . Nausea without vomiting 07/17/2014  . Crohn disease ([place]) 07/17/2014  . Diverticulitis of colon without hemorrhage 08/10/2013  . History of arthroscopy of right knee 07/25/2012  . Difficulty in walking(719.7) 06/29/2012  . Weakness of right leg 06/29/2012  . Posterior tibial tendonitis 11/11/2011  . PTTD (posterior tibial tendon dysfunction) 11/11/2011  . Knee pain, right 11/11/2011  . Chest pain 07/16/2011  . Primary osteoarthritis of left knee 02/25/2010  . PAIN IN JOINT, MULTIPLE SITES 02/25/2010  . Essential hypertension 08/13/2009  . HIATAL HERNIA 08/13/2009  . PALPITATIONS 08/13/2009  . DYSPNEA 08/13/2009       Geraldine Solar PT, DPT  [place] [address] [place], [place], [address] Phone: [phone]   Fax:  [phone]  Name: LEGACI TARMAN MRN: [id number] Date of Birth: [birth year]

## 2017-11-09 ENCOUNTER — Encounter (HOSPITAL_COMMUNITY): Payer: Self-pay

## 2017-11-09 ENCOUNTER — Telehealth (HOSPITAL_COMMUNITY): Payer: Self-pay | Admitting: Family Medicine

## 2017-11-09 ENCOUNTER — Other Ambulatory Visit: Payer: Self-pay

## 2017-11-09 ENCOUNTER — Ambulatory Visit (HOSPITAL_COMMUNITY): Payer: PRIVATE HEALTH INSURANCE | Attending: Orthopedic Surgery

## 2017-11-09 DIAGNOSIS — M6281 Muscle weakness (generalized): Secondary | ICD-10-CM | POA: Diagnosis not present

## 2017-11-09 DIAGNOSIS — M25662 Stiffness of left knee, not elsewhere classified: Secondary | ICD-10-CM | POA: Diagnosis not present

## 2017-11-09 DIAGNOSIS — M25562 Pain in left knee: Secondary | ICD-10-CM | POA: Diagnosis not present

## 2017-11-09 DIAGNOSIS — M25612 Stiffness of left shoulder, not elsewhere classified: Secondary | ICD-10-CM | POA: Diagnosis not present

## 2017-11-09 DIAGNOSIS — M25572 Pain in left ankle and joints of left foot: Secondary | ICD-10-CM | POA: Insufficient documentation

## 2017-11-09 DIAGNOSIS — M25512 Pain in left shoulder: Secondary | ICD-10-CM | POA: Insufficient documentation

## 2017-11-09 DIAGNOSIS — R29898 Other symptoms and signs involving the musculoskeletal system: Secondary | ICD-10-CM | POA: Insufficient documentation

## 2017-11-09 DIAGNOSIS — G8929 Other chronic pain: Secondary | ICD-10-CM | POA: Diagnosis not present

## 2017-11-09 NOTE — Patient Instructions (Signed)
   Ankle 4way with TB: 1-2 sets of 10-15 repetitions all directions  All theraband exercise is slow and controlled. Do not let the band "bounce" back. A. Plantarflexion: "gas pedal." Keep knee straight.Band around "ball of foot" and press it away as far as possible and slowly return to neutral. Repeat. B. Dorsiflexion: start in neutral and pull theraband back toward you as far as possible. pause. return slowly. keep knee straight. C.Inversion: start neutral and bring band toward your midline without bending or twisting knee. D. Eversion: start neutral and press band out without bending or twisting knee.

## 2017-11-09 NOTE — Therapy (Signed)
[place], [place], [address] Phone: [phone]   Fax:  [phone]  Physical Therapy Treatment/Re-Assessment  Patient Details  Name: [name] MRN: [id number] Date of Birth: 06/07/1966 Referring Provider: Geralynn Rile   Encounter Date: 11/09/2017  PT End of Session - 11/09/17 0854    Visit Number  7    Number of Visits  17    Date for PT Re-Evaluation  12/09/17    Authorization Type  Worker's Comp (8 visits approved per body part, 24 total visits)    Authorization Time Period  11/09/17-12/04/17 (8 more visits)    Authorization - Visit Number  7    Authorization - Number of Visits  16    PT Start Time  [phone] patient arrived late    PT Stop Time  0904    PT Time Calculation (min)  38 min    Activity Tolerance  Patient tolerated treatment well    Behavior During Therapy  [facility] for tasks assessed/performed       Past Medical History:  Diagnosis Date  . Arthritis   . Asthma   . Chest pain    cath 2005 norm cors, repeat cath Feb 2011 normal cors and only minimally  elevated pulmonary pressures  . Crohn disease ([place])   . Diverticulosis   . GERD (gastroesophageal reflux disease)   . Hiatal hernia   . Hypertension   . Obesity   . Palpitations   . Sleep apnea sleep study 11/03/2009   mild-no cpap; does sometimes, doesnt use every night.    Past Surgical History:  Procedure Laterality Date  . ABDOMINAL HYSTERECTOMY    . ABDOMINAL HYSTERECTOMY    . ANKLE ARTHROSCOPY  06/01/2012   Procedure: ANKLE ARTHROSCOPY;  Surgeon: Colin Rhein, MD;  Location: [facility];  Service: Orthopedics;  Laterality: Left;  left ankle arthorsocpy with extensive debridement and gastroc slide  . CARDIAC CATHETERIZATION  11/22/2009 and 2005   WNL  . CESAREAN SECTION    . CHOLECYSTECTOMY  09/08/2006   lap. chole.  . CHONDROPLASTY  06/17/2012   Procedure: CHONDROPLASTY;  Surgeon: Carole Civil, MD;  [facility];  Service: Orthopedics;  Laterality: Right;  right patella  . ESOPHAGOGASTRODUODENOSCOPY N/A 05/14/2015   Procedure: ESOPHAGOGASTRODUODENOSCOPY (EGD);  Surgeon: Rogene Houston, MD;  [facility];  Service: Endoscopy;  Laterality: N/A;  730  . INGUINAL HERNIA REPAIR  10/30/2008   right  . KNEE ARTHROSCOPY WITH LATERAL MENISECTOMY Left 12/23/2016   Procedure: LEFT KNEE ARTHROSCOPY WITH LATERAL MENISECTOMY;  Surgeon: Carole Civil, MD;  [facility];  Service: Orthopedics;  Laterality: Left;  . KNEE ARTHROSCOPY WITH MEDIAL MENISECTOMY Left 12/23/2016   Procedure: LEFT KNEE ARTHROSCOPY WITH MEDIAL MENISECTOMY CHONDROPLASTY PATELLA  AND MEDIAL FEMORAL CONDYLE LEFT KNEE;  Surgeon: Carole Civil, MD;  [facility];  Service: Orthopedics;  Laterality: Left;  . SHOULDER SURGERY     right x 2     There were no vitals filed for this visit.  Subjective Assessment - 11/09/17 0827    Subjective  Patient states she had some swelling in her left knee after last session and that it felt tight and stiff. She states it resolved after about a day and a half. She has a follow up with Dr. Stann Mainland on 11/17/17 to discuss left knee and shoulder for her workman's comp injuries. She will see Dr. Aline Brochure for her right knee  after her left improves as she thinks eventually she will need a TKA on the right. She states she wants to avoid surgery on the left if possible.     Pertinent History  R knee scope in 2013, L knee scope in March 2018    Limitations  Standing;Walking    How long can you sit comfortably?  no issues    How long can you stand comfortably?  30-60 minutes    How long can you walk comfortably?  30-60 minutes    Diagnostic tests  x-rays    Patient Stated Goals  get better, get back as close to pain free as possible    Currently in Pain?  Yes    Pain Score  4     Pain Location  Knee    Pain Orientation  Left    Pain Descriptors / Indicators  Aching    Pain Type  Chronic pain     Pain Onset  More than a month ago    Pain Frequency  Constant    Aggravating Factors   WB    Pain Relieving Factors  rest, muscle relaxors    Effect of Pain on Daily Activities  increased difficulty at work    Multiple Pain Sites  Yes    Pain Score  1    Pain Location  Ankle    Pain Orientation  Left    Pain Descriptors / Indicators  Throbbing;Aching "nervy kind of thing"    Pain Type  Chronic pain    Pain Onset  More than a month ago    Pain Frequency  Constant    Aggravating Factors   walking, standing, WB    Pain Relieving Factors  rest, ice    Effect of Pain on Daily Activities  increased difficulty at work         [facility] PT Assessment - 11/09/17 0001      Assessment   Medical Diagnosis  patellofemoral syndrome of L knee, patellar tendonitis of left knee, bursitis of left shoulder, pain and swelling in L ankle    Referring Provider  Geralynn Rile    Onset Date/Surgical Date  07/31/17    Next MD Visit  -- 11/17/17    Prior Therapy  yes for L knee scope earlier this year      Precautions   Precautions  None      Restrictions   Weight Bearing Restrictions  No      Observation/Other Assessments   Focus on Therapeutic Outcomes (FOTO)   60% limited was 52% limited on 09/09/17      Functional Tests   Functional tests  Single leg stance      Single Leg Stance   Comments  Rt LE: 20 seconds, Lt LE: 7 seconds      AROM   Left Knee Extension  0    Left Knee Flexion  110    Right Ankle Dorsiflexion  10    Right Ankle Plantar Flexion  60    Right Ankle Inversion  35    Right Ankle Eversion  25    Left Ankle Dorsiflexion  12    Left Ankle Plantar Flexion  60    Left Ankle Inversion  30    Left Ankle Eversion  20      Strength   Right Hip Flexion  4+/5 was 4/5    Right Hip Extension  4+/5 was 4/5    Right Hip ABduction  5/5 was  4+/5    Left Hip Flexion  4/5 was 4-/5    Left Hip Extension  4/5 was 4-/5    Left Hip ABduction  4+/5 was 4+/5    Right Knee Flexion  5/5  painful    Right Knee Extension  5/5    Left Knee Flexion  5/5 was 4+/5, painful    Left Knee Extension  5/5    Right Ankle Dorsiflexion  5/5    Right Ankle Inversion  5/5    Right Ankle Eversion  5/5    Left Ankle Dorsiflexion  5/5 was 4+/5    Left Ankle Inversion  4+/5 was 4/5    Left Ankle Eversion  5/5      Ambulation/Gait   Gait Pattern  Step-through pattern;Decreased stance time - left;Decreased dorsiflexion - left;Antalgic;Decreased step length - right    Ambulation Surface  Level    Stairs  Yes    Stairs Assistance  6: Modified independent (Device/Increase time)    Stair Management Technique  One rail Right;Step to pattern;Forwards    Number of Stairs  8    Height of Stairs  6    Gait Comments  stairs provoked right/left knee pain       Static Standing Balance   Static Standing - Comment/# of Minutes  Rt LE: 20 seconds, Lt LE: 7 seconds       [facility] Adult PT Treatment/Exercise - 11/09/17 0001      Ambulation/Gait   Ambulation Distance (Feet)  226 Feet    Assistive device  None      Knee/Hip Exercises: Stretches   Gastroc Stretch  Both;3 reps;30 seconds    Gastroc Stretch Limitations  slant board      Ankle Exercises: Seated   BAPS  Level 3;10 reps    Other Seated Ankle Exercises  4-way ankle with green theraband, 10 reps each direction      Ankle Exercises: Stretches   Press photographer  --        PT Education - 11/09/17 [phone]    Education provided  Yes    Education Details  Educated on current progress towards goals. Discussed current impairments. Educated on technique throughout session.    Person(s) Educated  Patient    Methods  Explanation    Comprehension  Verbalized understanding;Returned demonstration       PT Short Term Goals - 11/09/17 0830      PT SHORT TERM GOAL #1   Title  Pt will be independent with HEP and perform consistently in order to maximize return to PLOF.    Baseline  11/09/17 - reports performing some exercises every day    Time  2     Period  Weeks    Status  Achieved      PT SHORT TERM GOAL #2   Title  Pt will have improved ankle DF ROM to at least 5 deg in order to decrease pain and maximize gait and balance.    Time  2    Period  Weeks    Status  Achieved      PT SHORT TERM GOAL #3   Title  Pt will report being able to sleep through the night without awakening due to knee or ankle pain in order to maximize recovery.    Baseline  11/09/17 - if turn the worng way or move too quick, more becaus eof knee and shoulder    Time  2    Period  Weeks  Status  On-going    Target Date  11/23/17        PT Long Term Goals - 11/09/17 0831      PT LONG TERM GOAL #1   Title  Pt will have improved SLS to 10sec on LLE to demo improved stability and proprioception of L ankle and maximize gait.    Baseline  11/09/17 - RtLE: 20 secodns, LtLE: 7 seconds    Time  4    Period  Weeks    Status  On-going    Target Date  12/07/17      PT LONG TERM GOAL #2   Title  Pt will have improved overall MMT of BLE by at least a 1/2 grade in order to decrease overall pain and allow pt to perform functional tasks with greater ease.     Baseline  11/09/17 - improved all but left hip abduction    Time  4    Period  Weeks    Status  Partially Met    Target Date  12/07/17      PT LONG TERM GOAL #3   Title  Pt will report improved tolerance to standing and walking by 50% or > (to at least 40 mins to 1 hour) without 2/10 ankle and knee pain in order to maximize her function at work.    Baseline  11/09/17 - ambulateing 30-60 minutes before requiring a break    Time  4    Period  Weeks    Status  Partially Met    Target Date  12/07/17      PT LONG TERM GOAL #4   Title  Pt will be able to ascend/descend full flight of stairs reciprocally with no handrail and without knee or ankle pain in order to demo improved functional strength and maximize function at home and work.     Baseline  11/09/17 - step to pattern with 1-2 handrails, pain with 4-8 stairs     Time  4    Period  Weeks    Status  On-going    Target Date  12/07/17        Plan - 11/09/17 1041    Clinical Impression Statement  Patient rea-assessed today and she has met/partially 2/3 short term goals and 2/4 long term goals. She remains primarily limited in single limb stance activities on left LE because of ankle pain and today's session focused on ankle mobility and strengthening in NWB. She has improved Bil LE strength and ankle ROM but remains greatly limited with bil knee ROM into flexion. She may benefit from manual therapy to knee to improve flexion and decrease pain. She has made good progress and is progressing well with increased activity tolerance, and she will benefit from continued skilled PT services to address current impairments and progress towards remaining goals to return to PLOF.    Rehab Potential  Good    PT Frequency  2x / week    PT Duration  4 weeks    PT Treatment/Interventions  ADLs/Self Care Home Management;Cryotherapy;Electrical Stimulation;Moist Heat;Traction;Gait training;Stair training;Functional mobility training;Therapeutic activities;Therapeutic exercise;Balance training;Neuromuscular re-education;Cognitive remediation;Patient/family education;Manual techniques;Passive range of motion;Dry needling;Energy conservation;Taping    PT Next Visit Plan  Continue strengthening for Lt knee and ankle and progress to functional BLE strengthening, manual for pain/STM/ankle joint mobs for ROM, balance and stability/proprioceptive work, continue OKC or unweighted squat wiht total gym for quad/VMO strenghtening    PT Home Exercise Plan  eval: wall walking, calf stretching; 10/27/17:  bridge, abduction and hip extension, heel raises; 11/09/17 - 4 way ankle with green TB    Consulted and Agree with Plan of Care  Patient       Patient will benefit from skilled therapeutic intervention in order to improve the following deficits and impairments:  Abnormal gait, Decreased  activity tolerance, Decreased balance, Decreased mobility, Decreased range of motion, Decreased strength, Difficulty walking, Hypomobility, Increased muscle spasms, Increased fascial restricitons, Impaired UE functional use, Improper body mechanics, Postural dysfunction, Pain  Visit Diagnosis: Pain in left ankle and joints of left foot  Chronic pain of left knee  Muscle weakness (generalized)     Problem List Patient Active Problem List   Diagnosis Date Noted  . Derangement of anterior horn of lateral meniscus of left knee   . Derangement of posterior horn of medial meniscus of left knee   . Chest pain 04/25/2016  . Hypokalemia 04/25/2016  . Hyperglycemia 04/25/2016  . Diverticulitis of colon 07/17/2014  . Nausea without vomiting 07/17/2014  . Crohn disease ([place]) 07/17/2014  . Diverticulitis of colon without hemorrhage 08/10/2013  . History of arthroscopy of right knee 07/25/2012  . Difficulty in walking(719.7) 06/29/2012  . Weakness of right leg 06/29/2012  . Posterior tibial tendonitis 11/11/2011  . PTTD (posterior tibial tendon dysfunction) 11/11/2011  . Knee pain, right 11/11/2011  . Chest pain 07/16/2011  . Primary osteoarthritis of left knee 02/25/2010  . PAIN IN JOINT, MULTIPLE SITES 02/25/2010  . Essential hypertension 08/13/2009  . HIATAL HERNIA 08/13/2009  . PALPITATIONS 08/13/2009  . DYSPNEA 08/13/2009    Kipp Brood, PT, DPT Physical Therapist with [facility]  11/09/2017 10:51 AM    [place], [place], [address] Phone: [phone]   Fax:  [phone]  Name: MARIECLAIRE BETTENHAUSEN MRN: [id number] Date of Birth: 1966-02-25

## 2017-11-09 NOTE — Telephone Encounter (Signed)
11/09/17  Pt called and needed to reschedule the 2/6 time so we moved to 1:45 from 3:15

## 2017-11-10 ENCOUNTER — Other Ambulatory Visit: Payer: Self-pay | Admitting: Allergy

## 2017-11-10 ENCOUNTER — Ambulatory Visit
Admission: RE | Admit: 2017-11-10 | Discharge: 2017-11-10 | Disposition: A | Discharge status: Home / Self Care | Payer: Self-pay | Source: Ambulatory Visit | Attending: Allergy | Admitting: Allergy

## 2017-11-10 ENCOUNTER — Ambulatory Visit (HOSPITAL_COMMUNITY): Payer: PRIVATE HEALTH INSURANCE | Admitting: Specialist

## 2017-11-10 ENCOUNTER — Other Ambulatory Visit: Payer: Self-pay

## 2017-11-10 ENCOUNTER — Encounter (HOSPITAL_COMMUNITY): Payer: Self-pay | Admitting: Specialist

## 2017-11-10 DIAGNOSIS — M25572 Pain in left ankle and joints of left foot: Secondary | ICD-10-CM | POA: Diagnosis not present

## 2017-11-10 DIAGNOSIS — Z91030 Bee allergy status: Secondary | ICD-10-CM | POA: Diagnosis not present

## 2017-11-10 DIAGNOSIS — J4520 Mild intermittent asthma, uncomplicated: Secondary | ICD-10-CM | POA: Diagnosis not present

## 2017-11-10 DIAGNOSIS — J309 Allergic rhinitis, unspecified: Secondary | ICD-10-CM | POA: Diagnosis not present

## 2017-11-10 DIAGNOSIS — J45909 Unspecified asthma, uncomplicated: Secondary | ICD-10-CM | POA: Diagnosis not present

## 2017-11-10 DIAGNOSIS — R29898 Other symptoms and signs involving the musculoskeletal system: Secondary | ICD-10-CM

## 2017-11-10 DIAGNOSIS — G8929 Other chronic pain: Secondary | ICD-10-CM | POA: Diagnosis not present

## 2017-11-10 DIAGNOSIS — M25612 Stiffness of left shoulder, not elsewhere classified: Secondary | ICD-10-CM | POA: Diagnosis not present

## 2017-11-10 DIAGNOSIS — M25562 Pain in left knee: Secondary | ICD-10-CM | POA: Diagnosis not present

## 2017-11-10 DIAGNOSIS — M6281 Muscle weakness (generalized): Secondary | ICD-10-CM | POA: Diagnosis not present

## 2017-11-10 DIAGNOSIS — M25512 Pain in left shoulder: Secondary | ICD-10-CM

## 2017-11-10 DIAGNOSIS — T781XXA Other adverse food reactions, not elsewhere classified, initial encounter: Secondary | ICD-10-CM | POA: Diagnosis not present

## 2017-11-10 DIAGNOSIS — M25662 Stiffness of left knee, not elsewhere classified: Secondary | ICD-10-CM | POA: Diagnosis not present

## 2017-11-10 MED FILL — MONTELUKAST SOD 10 MG TAB: 10 | 30 days supply | Qty: 30 | Fill #0

## 2017-11-10 MED FILL — FLUTICASONE PROP 50 MCG SPR: 50 | 60 days supply | Qty: 16 | Fill #0

## 2017-11-10 NOTE — Patient Instructions (Signed)
Perform each exercise ____10-15____ reps. 2-3x days.   Protraction - STANDING  Start by holding a wand or cane at chest height.  Next, slowly push the wand outwards in front of your body so that your elbows become fully straightened. Then, return to the original position.     Shoulder FLEXION - STANDING - PALMS UP  In the standing position, hold a wand/cane with both arms, palms up on both sides. Raise up the wand/cane allowing your unaffected arm to perform most of the effort. Your affected arm should be partially relaxed.      Internal/External ROTATION - STANDING  In the standing position, hold a wand/cane with both hands keeping your elbows bent. Move your arms and wand/cane to one side.  Your affected arm should be partially relaxed while your unaffected arm performs most of the effort.       Shoulder ABDUCTION - STANDING  While holding a wand/cane palm face up on the injured side and palm face down on the uninjured side, slowly raise up your injured arm to the side.                     Horizontal Abduction/Adduction      Straight arms holding cane at shoulder height, bring cane to right, center, left. Repeat starting to left.   Copyright  VHI. All rights reserved.

## 2017-11-10 NOTE — Therapy (Signed)
[facility] [address] [place], [place], [address] Phone: [phone]   Fax:  [phone]  Occupational Therapy Evaluation  Patient Details  Name: [name] MRN: [id number] Date of Birth: 08-27-1966 Referring Provider: Dr. Geralynn Rile   Encounter Date: 11/10/2017  OT End of Session - 11/10/17 1539    Visit Number  1    Number of Visits  8    Date for OT Re-Evaluation  12/10/17    Authorization Type  workers compensation approved 8 visits for her shoulder.      Authorization - Visit Number  1    Authorization - Number of Visits  8    OT Start Time  1350    OT Stop Time  1430    OT Time Calculation (min)  40 min    Activity Tolerance  Patient tolerated treatment well    Behavior During Therapy  WFL for tasks assessed/performed       Past Medical History:  Diagnosis Date  . Arthritis   . Asthma   . Chest pain    cath 2005 norm cors, repeat cath Feb 2011 normal cors and only minimally  elevated pulmonary pressures  . Crohn disease ([place])   . Diverticulosis   . GERD (gastroesophageal reflux disease)   . Hiatal hernia   . Hypertension   . Obesity   . Palpitations   . Sleep apnea sleep study 11/03/2009   mild-no cpap; does sometimes, doesnt use every night.    Past Surgical History:  Procedure Laterality Date  . ABDOMINAL HYSTERECTOMY    . ABDOMINAL HYSTERECTOMY    . ANKLE ARTHROSCOPY  06/01/2012   Procedure: ANKLE ARTHROSCOPY;  Surgeon: Colin Rhein, MD;  Location: [facility];  Service: Orthopedics;  Laterality: Left;  left ankle arthorsocpy with extensive debridement and gastroc slide  . CARDIAC CATHETERIZATION  11/22/2009 and 2005   WNL  . CESAREAN SECTION    . CHOLECYSTECTOMY  09/08/2006   lap. chole.  . CHONDROPLASTY  06/17/2012   Procedure: CHONDROPLASTY;  Surgeon: Carole Civil, MD;  [facility];  Service: Orthopedics;  Laterality: Right;  right patella  . ESOPHAGOGASTRODUODENOSCOPY  N/A 05/14/2015   Procedure: ESOPHAGOGASTRODUODENOSCOPY (EGD);  Surgeon: Rogene Houston, MD;  [facility];  Service: Endoscopy;  Laterality: N/A;  730  . INGUINAL HERNIA REPAIR  10/30/2008   right  . KNEE ARTHROSCOPY WITH LATERAL MENISECTOMY Left 12/23/2016   Procedure: LEFT KNEE ARTHROSCOPY WITH LATERAL MENISECTOMY;  Surgeon: Carole Civil, MD;  [facility];  Service: Orthopedics;  Laterality: Left;  . KNEE ARTHROSCOPY WITH MEDIAL MENISECTOMY Left 12/23/2016   Procedure: LEFT KNEE ARTHROSCOPY WITH MEDIAL MENISECTOMY CHONDROPLASTY PATELLA  AND MEDIAL FEMORAL CONDYLE LEFT KNEE;  Surgeon: Carole Civil, MD;  Location: [facility];  Service: Orthopedics;  Laterality: Left;  . SHOULDER SURGERY     right x 2     There were no vitals filed for this visit.  Subjective Assessment - 11/10/17 1534    Subjective   S:  I had an incident at work.  I fell and tried to catch myself.  I injured my left shoulder, and my knee and ankle.    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 Her L ankle rolled in, her  L knee twisted, and she caught herself on her L shoulder. Ever since, she's been having popping, discomfort, and some catching/locking up in her L knee. This has decreased since the fall happened on 07/31/17. She states that her L shoulder is bothering her the most currently. Anything against gravity aggravates her shoulder pain (OH in cabinets, putting her clothes on, etc.). She is R handed, but "does everything with her L hand." She reports pain at her UT and initially had n/t/burning pain down her LUE. She got a steroid shot on Thursday 09/02/17, and she is having much less burning pain in her arm, it is more in her UT area. She has been recieving treatment for her knee and ankle since December and is now referred for her  shoulder.  She has had an MRI which detected a labrum tear.  She recieved a cortisone injection, however it did not help alleviate the pain she is experiencing.    Special Tests  FOTO 55.87    Patient Stated Goals  I want to be pain free and 100%    Currently in Pain?  Yes    Pain Score  5     Pain Location  Shoulder    Pain Orientation  Left    Pain Descriptors / Indicators  Aching    Pain Type  Acute pain    Pain Radiating Towards  elbow    Pain Onset  More than a month ago    Pain Frequency  Constant    Aggravating Factors   use    Pain Relieving Factors  rest    Effect of Pain on Daily Activities  moderate        [facility] OT Assessment - 11/10/17 0001      Assessment   Medical Diagnosis  Left Shoulder Pain    Referring Provider  Dr. Geralynn Rile    Onset Date/Surgical Date  07/31/17    Prior Therapy  yes for L knee scope earlier this year      Precautions   Precautions  None      Restrictions   Weight Bearing Restrictions  No      Balance Screen   Has the patient fallen in the past 6 months  Yes    How many times?  1    Has the patient had a decrease in activity level because of a fear of falling?   No    Is the patient reluctant to leave their home because of a fear of falling?   No      Home  Environment   Family/patient expects to be discharged to:  Private residence    Lives With  Family      Prior Function   Level of Independence  Independent    Vocation  Full time employment    Vocation Requirements  lots of walking and standing, reaching overhead     Leisure  spending time with family      ADL   ADL comments  reaching overhead is painful, reaching back to don a coat or jacket is painful, reaching to the back seat of her car is painful.        Vision - History   Baseline Vision  No visual deficits      Cognition   Overall Cognitive Status  Within Functional Limits for tasks assessed      Observation/Other Assessments   Focus on Therapeutic Outcomes  (FOTO)   56% limited  Sensation   Light Touch  Appears Intact      Coordination   Gross Motor Movements are Fluid and Coordinated  Yes    Fine Motor Movements are Fluid and Coordinated  Yes      ROM / Strength   AROM / PROM / Strength  AROM;PROM;Strength      Palpation   Palpation comment  moderate tightness and restrictions in left shoulder region, scapular region, upper trapezius region      AROM   Overall AROM Comments  assessed in seated, external and internal rotation with shoulder adducted    Left Shoulder Flexion  135 Degrees    Left Shoulder ABduction  120 Degrees    Left Shoulder Internal Rotation  90 Degrees    Left Shoulder External Rotation  60 Degrees      PROM   PROM Assessment Site  Shoulder    Right/Left Shoulder  Left    Left Shoulder Flexion  148 Degrees    Left Shoulder ABduction  134 Degrees    Left Shoulder Internal Rotation  90 Degrees    Left Shoulder External Rotation  60 Degrees      Strength   Left Shoulder Flexion  4/5    Left Shoulder ABduction  4/5    Left Shoulder Internal Rotation  4/5    Left Shoulder External Rotation  4/5               OT Treatments/Exercises (OP) - 11/10/17 0001      Manual Therapy   Manual Therapy  Myofascial release    Manual therapy comments  completed seperately from all other skilled interventions    Soft tissue mobilization  myofascial release to left shoulder, scapular, and upper arm region to decrease pain and improve pain free mobility.             OT Education - 11/10/17 1538    Education provided  Yes    Education Details  educated on aa/rom for left shoulder in supine.      Person(s) Educated  Patient    Methods  Explanation;Demonstration;Handout    Comprehension  Verbalized understanding;Returned demonstration       OT Short Term Goals - 11/10/17 1559      OT SHORT TERM GOAL #1   Title  Patient will be educated and independent with HEP for left shoulder mobility for improved  abiilty to reach overhead.     Time  4    Period  Weeks    Status  New    Target Date  12/10/17      OT SHORT TERM GOAL #2   Title  Patient will improve left shoulder A/ROM to [facility] for improved ability to don and doff coats without pain.     Time  4    Period  Weeks    Status  New      OT SHORT TERM GOAL #3   Title  Patient will improve left shoulder strength to 4+/5 or better for improved ability to lift items at work.      Time  4    Period  Weeks    Status  New      OT SHORT TERM GOAL #4   Title  Patient will improve left shoulder pain to 3/10 or less while completing work tasks.     Time  4    Period  Weeks    Status  New      OT SHORT TERM GOAL #  5   Title  Patient will decrease stiffness and fascial restrictions in her left shoulder region to trace for improved ability to complete ADLs and work activities pain free.     Time  4    Period  Weeks    Status  New               Plan - 11/10/17 1544    Clinical Impression Statement  A:  Patient is a 52 year old female whom was injured while at work in 2018.  She is experiencing left shoulder, hip, and knee pain.  PT is addressing hip and knee pain.  Her shoulder pain is limiting her ability to reach overhead, don and doff coats, reach into back seat of the car, and lift items at home and at work.      Occupational Profile and client history currently impacting functional performance  motivation, age     Occupational performance deficits (Please refer to evaluation for details):  ADL's;IADL's;Work;Leisure    Rehab Potential  Good    Current Impairments/barriers affecting progress:  n/a    OT Frequency  2x / week    OT Duration  4 weeks    OT Treatment/Interventions  Self-care/ADL training;Electrical Stimulation;Therapeutic exercise;Patient/family education;Energy conservation;Therapeutic activities;Ultrasound;Cryotherapy;Manual Therapy;Passive range of motion    Plan  P:  Patient will benefit from skilled OT intervention  to decrease pain and tightness and improve pain free mobility and strength in her left shoulder region in order to use arm 100% without pain.  Next session:  myofascial release, manual stretching, AA/ROM and A/ROM progress as tolerated.     Clinical Decision Making  Limited treatment options, no task modification necessary    OT Home Exercise Plan  11/10/17:  aa/rom for shoulder    Consulted and Agree with Plan of Care  Patient       Patient will benefit from skilled therapeutic intervention in order to improve the following deficits and impairments:  Pain, Decreased range of motion, Decreased strength, Impaired UE functional use  Visit Diagnosis: Stiffness of left shoulder, not elsewhere classified  Acute pain of left shoulder  Other symptoms and signs involving the musculoskeletal system    Problem List Patient Active Problem List   Diagnosis Date Noted  . Derangement of anterior horn of lateral meniscus of left knee   . Derangement of posterior horn of medial meniscus of left knee   . Chest pain 04/25/2016  . Hypokalemia 04/25/2016  . Hyperglycemia 04/25/2016  . Diverticulitis of colon 07/17/2014  . Nausea without vomiting 07/17/2014  . Crohn disease ([place]) 07/17/2014  . Diverticulitis of colon without hemorrhage 08/10/2013  . History of arthroscopy of right knee 07/25/2012  . Difficulty in walking(719.7) 06/29/2012  . Weakness of right leg 06/29/2012  . Posterior tibial tendonitis 11/11/2011  . PTTD (posterior tibial tendon dysfunction) 11/11/2011  . Knee pain, right 11/11/2011  . Chest pain 07/16/2011  . Primary osteoarthritis of left knee 02/25/2010  . PAIN IN JOINT, MULTIPLE SITES 02/25/2010  . Essential hypertension 08/13/2009  . HIATAL HERNIA 08/13/2009  . PALPITATIONS 08/13/2009  . DYSPNEA 08/13/2009    Vangie Bicker, [place], OTR/L [phone]  11/10/2017, 4:10 PM  [facility] [address] [place], [place],  [address] Phone: [phone]   Fax:  [phone]  Name: [name] MRN: [id number] Date of Birth: [birth year]

## 2017-11-12 ENCOUNTER — Encounter (HOSPITAL_COMMUNITY): Payer: Self-pay

## 2017-11-12 ENCOUNTER — Ambulatory Visit (HOSPITAL_COMMUNITY): Payer: PRIVATE HEALTH INSURANCE

## 2017-11-12 ENCOUNTER — Ambulatory Visit (HOSPITAL_COMMUNITY): Payer: PRIVATE HEALTH INSURANCE | Admitting: Occupational Therapy

## 2017-11-12 ENCOUNTER — Encounter (HOSPITAL_COMMUNITY): Payer: Self-pay | Admitting: Occupational Therapy

## 2017-11-12 DIAGNOSIS — G8929 Other chronic pain: Secondary | ICD-10-CM | POA: Diagnosis not present

## 2017-11-12 DIAGNOSIS — M25512 Pain in left shoulder: Secondary | ICD-10-CM | POA: Diagnosis not present

## 2017-11-12 DIAGNOSIS — M25572 Pain in left ankle and joints of left foot: Secondary | ICD-10-CM

## 2017-11-12 DIAGNOSIS — M6281 Muscle weakness (generalized): Secondary | ICD-10-CM

## 2017-11-12 DIAGNOSIS — M25612 Stiffness of left shoulder, not elsewhere classified: Secondary | ICD-10-CM | POA: Diagnosis not present

## 2017-11-12 DIAGNOSIS — R29898 Other symptoms and signs involving the musculoskeletal system: Secondary | ICD-10-CM

## 2017-11-12 DIAGNOSIS — M25662 Stiffness of left knee, not elsewhere classified: Secondary | ICD-10-CM | POA: Diagnosis not present

## 2017-11-12 DIAGNOSIS — M25562 Pain in left knee: Secondary | ICD-10-CM | POA: Diagnosis not present

## 2017-11-12 NOTE — Patient Instructions (Signed)

## 2017-11-12 NOTE — Therapy (Signed)
[facility] [place], [place], [address] Phone: [phone]   Fax:  [phone]  Occupational Therapy Treatment  Patient Details  Name: [name] MRN: [id number] Date of Birth: 06-18-1966 Referring Provider: Dr. Geralynn Rile   Encounter Date: 11/12/2017  OT End of Session - 11/12/17 1012    Visit Number  2    Number of Visits  8    Date for OT Re-Evaluation  12/10/17    Authorization Type  workers compensation approved 8 visits for her shoulder.      Authorization - Visit Number  2    Authorization - Number of Visits  8    OT Start Time  0900    OT Stop Time  0947    OT Time Calculation (min)  47 min    Activity Tolerance  Patient tolerated treatment well    Behavior During Therapy  WFL for tasks assessed/performed       Past Medical History:  Diagnosis Date  . Arthritis   . Asthma   . Chest pain    cath 2005 norm cors, repeat cath Feb 2011 normal cors and only minimally  elevated pulmonary pressures  . Crohn disease ([place])   . Diverticulosis   . GERD (gastroesophageal reflux disease)   . Hiatal hernia   . Hypertension   . Obesity   . Palpitations   . Sleep apnea sleep study 11/03/2009   mild-no cpap; does sometimes, doesnt use every night.    Past Surgical History:  Procedure Laterality Date  . ABDOMINAL HYSTERECTOMY    . ABDOMINAL HYSTERECTOMY    . ANKLE ARTHROSCOPY  06/01/2012   Procedure: ANKLE ARTHROSCOPY;  Surgeon: Colin Rhein, MD;  Location: [facility];  Service: Orthopedics;  Laterality: Left;  left ankle arthorsocpy with extensive debridement and gastroc slide  . CARDIAC CATHETERIZATION  11/22/2009 and 2005   WNL  . CESAREAN SECTION    . CHOLECYSTECTOMY  09/08/2006   lap. chole.  . CHONDROPLASTY  06/17/2012   Procedure: CHONDROPLASTY;  Surgeon: Carole Civil, MD;  [facility];  Service: Orthopedics;  Laterality: Right;  right patella  . ESOPHAGOGASTRODUODENOSCOPY N/A  05/14/2015   Procedure: ESOPHAGOGASTRODUODENOSCOPY (EGD);  Surgeon: Rogene Houston, MD;  [facility];  Service: Endoscopy;  Laterality: N/A;  730  . INGUINAL HERNIA REPAIR  10/30/2008   right  . KNEE ARTHROSCOPY WITH LATERAL MENISECTOMY Left 12/23/2016   Procedure: LEFT KNEE ARTHROSCOPY WITH LATERAL MENISECTOMY;  Surgeon: Carole Civil, MD;  [facility];  Service: Orthopedics;  Laterality: Left;  . KNEE ARTHROSCOPY WITH MEDIAL MENISECTOMY Left 12/23/2016   Procedure: LEFT KNEE ARTHROSCOPY WITH MEDIAL MENISECTOMY CHONDROPLASTY PATELLA  AND MEDIAL FEMORAL CONDYLE LEFT KNEE;  Surgeon: Carole Civil, MD;  [facility];  Service: Orthopedics;  Laterality: Left;  . SHOULDER SURGERY     right x 2     There were no vitals filed for this visit.  Subjective Assessment - 11/12/17 0857    Subjective   S: I got the doctor to write me a prescription for that Voltaren gel.     Currently in Pain?  Yes    Pain Score  6     Pain Location  Shoulder    Pain Orientation  Left    Pain Descriptors / Indicators  Sharp;Tender    Pain Type  Acute pain    Pain Radiating Towards  elbow and hand  Pain Onset  More than a month ago    Pain Frequency  Constant    Aggravating Factors   use    Pain Relieving Factors  rest    Effect of Pain on Daily Activities  moderate    Multiple Pain Sites  No         [facility] OT Assessment - 11/12/17 0857      Assessment   Medical Diagnosis  Left Shoulder Pain      Precautions   Precautions  None               OT Treatments/Exercises (OP) - 11/12/17 0859      Exercises   Exercises  Shoulder      Shoulder Exercises: Supine   Protraction  PROM;AROM;10 reps    Horizontal ABduction  PROM;AROM;10 reps    External Rotation  PROM;AROM;10 reps    Internal Rotation  PROM;AROM;10 reps    Flexion  PROM;AROM;10 reps    ABduction  PROM;AROM;10 reps      Shoulder Exercises: Seated   Protraction  AAROM;10 reps    Horizontal ABduction   AAROM;10 reps    External Rotation  AROM;10 reps    Internal Rotation  AROM;10 reps    Flexion  AAROM;10 reps    Abduction  AAROM;10 reps      Manual Therapy   Manual Therapy  Myofascial release    Manual therapy comments  completed seperately from all other skilled interventions    Myofascial Release  myofascial release to left shoulder, scapular, and upper arm region to decrease pain and improve pain free mobility.              OT Education - 11/12/17 [phone]    Education provided  Yes    Education Details  A/ROM in supine    Person(s) Educated  Patient    Methods  Explanation;Demonstration;Handout    Comprehension  Verbalized understanding;Returned demonstration       OT Short Term Goals - 11/12/17 0858      OT SHORT TERM GOAL #1   Title  Patient will be educated and independent with HEP for left shoulder mobility for improved abiilty to reach overhead.     Time  4    Period  Weeks    Status  On-going      OT SHORT TERM GOAL #2   Title  Patient will improve left shoulder A/ROM to [facility] for improved ability to don and doff coats without pain.     Time  4    Period  Weeks    Status  On-going      OT SHORT TERM GOAL #3   Title  Patient will improve left shoulder strength to 4+/5 or better for improved ability to lift items at work.      Time  4    Period  Weeks    Status  On-going      OT SHORT TERM GOAL #4   Title  Patient will improve left shoulder pain to 3/10 or less while completing work tasks.     Time  4    Period  Weeks    Status  On-going      OT SHORT TERM GOAL #5   Title  Patient will decrease stiffness and fascial restrictions in her left shoulder region to trace for improved ability to complete ADLs and work activities pain free.     Time  4    Period  Weeks  Status  On-going               Plan - 11/12/17 1012    Clinical Impression Statement  A: Initiated myofascial release, manual therapy, P/ROM, supine A/ROM, and seated AA/ROM today.  Pt has ROM WNL passively and actively. Updated HEP for A/ROM in supine. Occasional verbal cuing for form and technique.     Plan  P: Follow up on HEP. Attempt A/ROM in sitting, add scapular A/ROM exercises    Consulted and Agree with Plan of Care  Patient       Patient will benefit from skilled therapeutic intervention in order to improve the following deficits and impairments:  Pain, Decreased range of motion, Decreased strength, Impaired UE functional use  Visit Diagnosis: Stiffness of left shoulder, not elsewhere classified  Acute pain of left shoulder  Other symptoms and signs involving the musculoskeletal system    Problem List Patient Active Problem List   Diagnosis Date Noted  . Derangement of anterior horn of lateral meniscus of left knee   . Derangement of posterior horn of medial meniscus of left knee   . Chest pain 04/25/2016  . Hypokalemia 04/25/2016  . Hyperglycemia 04/25/2016  . Diverticulitis of colon 07/17/2014  . Nausea without vomiting 07/17/2014  . Crohn disease ([place]) 07/17/2014  . Diverticulitis of colon without hemorrhage 08/10/2013  . History of arthroscopy of right knee 07/25/2012  . Difficulty in walking(719.7) 06/29/2012  . Weakness of right leg 06/29/2012  . Posterior tibial tendonitis 11/11/2011  . PTTD (posterior tibial tendon dysfunction) 11/11/2011  . Knee pain, right 11/11/2011  . Chest pain 07/16/2011  . Primary osteoarthritis of left knee 02/25/2010  . PAIN IN JOINT, MULTIPLE SITES 02/25/2010  . Essential hypertension 08/13/2009  . HIATAL HERNIA 08/13/2009  . PALPITATIONS 08/13/2009  . DYSPNEA 08/13/2009   Guadelupe Sabin, OTR/L  [phone] 11/12/2017, 10:17 AM  [facility] [address] [place], [place], [address] Phone: [phone]   Fax:  [phone]  Name: ROZETTA STUMPP MRN: [id number] Date of Birth: 07-29-1966

## 2017-11-12 NOTE — Therapy (Signed)
[facility] [address] [place], [place], [address] Phone: [phone]   Fax:  [phone]  Physical Therapy Treatment  Patient Details  Name: [name] MRN: [id number] Date of Birth: [birth year] Referring Provider: Dr. Geralynn Rile   Encounter Date: 11/12/2017  PT End of Session - 11/12/17 0829    Visit Number  8    Number of Visits  17    Date for PT Re-Evaluation  12/09/17    Authorization Type  [employer]'s [employer] (8 visits approved per body part, 24 total visits)    Authorization Time Period  11/09/17-12/04/17 (8 more visits)    Authorization - Visit Number  8    Authorization - Number of Visits  16    PT Start Time  0820    PT Stop Time  2831    PT Time Calculation (min)  38 min       Past Medical History:  Diagnosis Date  . Arthritis   . Asthma   . Chest pain    cath 2005 norm cors, repeat cath Feb 2011 normal cors and only minimally  elevated pulmonary pressures  . Crohn disease ([place])   . Diverticulosis   . GERD (gastroesophageal reflux disease)   . Hiatal hernia   . Hypertension   . Obesity   . Palpitations   . Sleep apnea sleep study 11/03/2009   mild-no cpap; does sometimes, doesnt use every night.    Past Surgical History:  Procedure Laterality Date  . ABDOMINAL HYSTERECTOMY    . ABDOMINAL HYSTERECTOMY    . ANKLE ARTHROSCOPY  06/01/2012   Procedure: ANKLE ARTHROSCOPY;  Surgeon: Colin Rhein, MD;  Location: [facility];  Service: Orthopedics;  Laterality: Left;  left ankle arthorsocpy with extensive debridement and gastroc slide  . CARDIAC CATHETERIZATION  11/22/2009 and 2005   WNL  . CESAREAN SECTION    . CHOLECYSTECTOMY  09/08/2006   lap. chole.  . CHONDROPLASTY  06/17/2012   Procedure: CHONDROPLASTY;  Surgeon: Carole Civil, MD;  Location: [facility];  Service: Orthopedics;  Laterality: Right;  right patella  . ESOPHAGOGASTRODUODENOSCOPY N/A 05/14/2015   Procedure: ESOPHAGOGASTRODUODENOSCOPY  (EGD);  Surgeon: Rogene Houston, MD;  [facility];  Service: Endoscopy;  Laterality: N/A;  730  . INGUINAL HERNIA REPAIR  10/30/2008   right  . KNEE ARTHROSCOPY WITH LATERAL MENISECTOMY Left 12/23/2016   Procedure: LEFT KNEE ARTHROSCOPY WITH LATERAL MENISECTOMY;  Surgeon: Carole Civil, MD;  [facility];  Service: Orthopedics;  Laterality: Left;  . KNEE ARTHROSCOPY WITH MEDIAL MENISECTOMY Left 12/23/2016   Procedure: LEFT KNEE ARTHROSCOPY WITH MEDIAL MENISECTOMY CHONDROPLASTY PATELLA  AND MEDIAL FEMORAL CONDYLE LEFT KNEE;  Surgeon: Carole Civil, MD;  Location: [facility];  Service: Orthopedics;  Laterality: Left;  . SHOULDER SURGERY     right x 2     There were no vitals filed for this visit.  Subjective Assessment - 11/12/17 0827    Subjective  Pt stated ankle is extremely painful this morning, swelling and tender to the touch, pain scale 10/10 today.      Pertinent History  R knee scope in 2013, L knee scope in March 2018    Patient Stated Goals  get better, get back as close to pain free as possible    Currently in Pain?  Yes    Pain Score  10-Worst pain ever    Pain Location  Ankle    Pain Orientation  Left    Pain Descriptors / Indicators  Tender;Sharp    Pain Type  Acute pain    Pain Onset  More than a month ago    Pain Frequency  Constant    Aggravating Factors   use    Pain Relieving Factors  rest    Effect of Pain on Daily Activities  moderate                      [facility] Adult PT Treatment/Exercise - 11/12/17 0001      Knee/Hip Exercises: Machines for Strengthening   Cybex Knee Extension  2.5 Pl, BLE 2x10    Cybex Knee Flexion  3.5 BLE 2x 10    Other Machine  Power tower at [employer] 2x 15      Manual Therapy   Manual Therapy  Edema management    Manual therapy comments  completed seperately from all other skilled interventions    Edema Management  Retro massage with LE elevated      Ankle Exercises: Seated   BAPS  Level 3;15  reps    Other Seated Ankle Exercises  4-way ankle with green theraband, 10 reps each direction               PT Short Term Goals - 11/09/17 0830      PT SHORT TERM GOAL #1   Title  Pt will be independent with HEP and perform consistently in order to maximize return to PLOF.    Baseline  11/09/17 - reports performing some exercises every day    Time  2    Period  Weeks    Status  Achieved      PT SHORT TERM GOAL #2   Title  Pt will have improved ankle DF ROM to at least 5 deg in order to decrease pain and maximize gait and balance.    Time  2    Period  Weeks    Status  Achieved      PT SHORT TERM GOAL #3   Title  Pt will report being able to sleep through the night without awakening due to knee or ankle pain in order to maximize recovery.    Baseline  11/09/17 - if turn the worng way or move too quick, more becaus eof knee and shoulder    Time  2    Period  Weeks    Status  On-going    Target Date  11/23/17        PT Long Term Goals - 11/09/17 0831      PT LONG TERM GOAL #1   Title  Pt will have improved SLS to 10sec on LLE to demo improved stability and proprioception of L ankle and maximize gait.    Baseline  11/09/17 - RtLE: 20 secodns, LtLE: 7 seconds    Time  4    Period  Weeks    Status  On-going    Target Date  12/07/17      PT LONG TERM GOAL #2   Title  Pt will have improved overall MMT of BLE by at least a 1/2 grade in order to decrease overall pain and allow pt to perform functional tasks with greater ease.     Baseline  11/09/17 - improved all but left hip abduction    Time  4    Period  Weeks    Status  Partially Met    Target Date  12/07/17  PT LONG TERM GOAL #3   Title  Pt will report improved tolerance to standing and walking by 50% or > (to at least 40 mins to 1 hour) without 2/10 ankle and knee pain in order to maximize her function at work.    Baseline  11/09/17 - ambulateing 30-60 minutes before requiring a break    Time  4    Period  Weeks     Status  Partially Met    Target Date  12/07/17      PT LONG TERM GOAL #4   Title  Pt will be able to ascend/descend full flight of stairs reciprocally with no handrail and without knee or ankle pain in order to demo improved functional strength and maximize function at home and work.     Baseline  11/09/17 - step to pattern with 1-2 handrails, pain with 4-8 stairs    Time  4    Period  Weeks    Status  On-going    Target Date  12/07/17            Plan - 11/12/17 1452    Clinical Impression Statement  Pt limited by Lt ankle pain through session, reports she was on feet with work ~12 hours yesterday and increased pain following.  Session foucs on ankle mobility and LE strenghtening in pain free range.  All exercises complete with NWB due to high pain scale.  Pt able to tolerate well towards tx with no reports of increased pain, was limited by fatigue at EOS.  EOS with manual technqiues to address edema present proximal ankle and medial knee for pain control.  Reports pain reduced to 5/10 with NWB at EOS.      Rehab Potential  Good    PT Frequency  2x / week    PT Duration  4 weeks    PT Treatment/Interventions  ADLs/Self Care Home Management;Cryotherapy;Electrical Stimulation;Moist Heat;Traction;Gait training;Stair training;Functional mobility training;Therapeutic activities;Therapeutic exercise;Balance training;Neuromuscular re-education;Cognitive remediation;Patient/family education;Manual techniques;Passive range of motion;Dry needling;Energy conservation;Taping    PT Next Visit Plan  Continue strengthening for Lt knee and ankle and progress to functional BLE strengthening, manual for pain/STM/ankle joint mobs for ROM, balance and stability/proprioceptive work, continue OKC or unweighted squat wiht total gym for quad/VMO strenghtening    PT Home Exercise Plan  eval: wall walking, calf stretching; 10/27/17:  bridge, abduction and hip extension, heel raises; 11/09/17 - 4 way ankle with green TB        Patient will benefit from skilled therapeutic intervention in order to improve the following deficits and impairments:  Abnormal gait, Decreased activity tolerance, Decreased balance, Decreased mobility, Decreased range of motion, Decreased strength, Difficulty walking, Hypomobility, Increased muscle spasms, Increased fascial restricitons, Impaired UE functional use, Improper body mechanics, Postural dysfunction, Pain  Visit Diagnosis: Pain in left ankle and joints of left foot  Chronic pain of left knee  Muscle weakness (generalized)     Problem List Patient Active Problem List   Diagnosis Date Noted  . Derangement of anterior horn of lateral meniscus of left knee   . Derangement of posterior horn of medial meniscus of left knee   . Chest pain 04/25/2016  . Hypokalemia 04/25/2016  . Hyperglycemia 04/25/2016  . Diverticulitis of colon 07/17/2014  . Nausea without vomiting 07/17/2014  . Crohn disease ([place]) 07/17/2014  . Diverticulitis of colon without hemorrhage 08/10/2013  . History of arthroscopy of right knee 07/25/2012  . Difficulty in walking(719.7) 06/29/2012  . Weakness of right leg 06/29/2012  .  Posterior tibial tendonitis 11/11/2011  . PTTD (posterior tibial tendon dysfunction) 11/11/2011  . Knee pain, right 11/11/2011  . Chest pain 07/16/2011  . Primary osteoarthritis of left knee 02/25/2010  . PAIN IN JOINT, MULTIPLE SITES 02/25/2010  . Essential hypertension 08/13/2009  . HIATAL HERNIA 08/13/2009  . PALPITATIONS 08/13/2009  . DYSPNEA 08/13/2009   Ihor Austin, [place]; [place]  Aldona Lento 11/12/2017, 2:55 PM  [facility] [address] [place], [place], [address] Phone: [phone]   Fax:  [phone]  Name: [name] MRN: [id number] Date of Birth: [birth year]

## 2017-11-16 ENCOUNTER — Encounter (HOSPITAL_COMMUNITY): Payer: Self-pay | Admitting: Physical Therapy

## 2017-11-16 ENCOUNTER — Encounter (HOSPITAL_COMMUNITY): Payer: Self-pay | Admitting: Specialist

## 2017-11-16 ENCOUNTER — Ambulatory Visit (HOSPITAL_COMMUNITY): Payer: PRIVATE HEALTH INSURANCE | Admitting: Physical Therapy

## 2017-11-16 ENCOUNTER — Ambulatory Visit (HOSPITAL_COMMUNITY): Payer: PRIVATE HEALTH INSURANCE | Admitting: Specialist

## 2017-11-16 ENCOUNTER — Other Ambulatory Visit: Payer: Self-pay

## 2017-11-16 DIAGNOSIS — R29898 Other symptoms and signs involving the musculoskeletal system: Secondary | ICD-10-CM | POA: Diagnosis not present

## 2017-11-16 DIAGNOSIS — M25572 Pain in left ankle and joints of left foot: Secondary | ICD-10-CM

## 2017-11-16 DIAGNOSIS — M25512 Pain in left shoulder: Secondary | ICD-10-CM

## 2017-11-16 DIAGNOSIS — M25612 Stiffness of left shoulder, not elsewhere classified: Secondary | ICD-10-CM

## 2017-11-16 DIAGNOSIS — M25662 Stiffness of left knee, not elsewhere classified: Secondary | ICD-10-CM | POA: Diagnosis not present

## 2017-11-16 DIAGNOSIS — G8929 Other chronic pain: Secondary | ICD-10-CM

## 2017-11-16 DIAGNOSIS — M25562 Pain in left knee: Secondary | ICD-10-CM

## 2017-11-16 DIAGNOSIS — M6281 Muscle weakness (generalized): Secondary | ICD-10-CM | POA: Diagnosis not present

## 2017-11-16 NOTE — Therapy (Signed)
[facility] [place], [place], [address] Phone: [phone]   Fax:  [phone]  Physical Therapy Treatment  Patient Details  Name: [name] MRN: [id number] Date of Birth: 1966-02-21 Referring Provider: Dr. Geralynn Rile   Encounter Date: 11/16/2017  PT End of Session - 11/16/17 0928    Visit Number  9    Number of Visits  17    Date for PT Re-Evaluation  12/09/17    Authorization Type  Worker's Comp (8 visits approved per body part, 24 total visits)    Authorization Time Period  11/09/17-12/04/17 (8 more visits)    Authorization - Visit Number  9    Authorization - Number of Visits  16    PT Start Time  0903    PT Stop Time  0945    PT Time Calculation (min)  42 min    Activity Tolerance  Patient tolerated treatment well    Behavior During Therapy  [facility] for tasks assessed/performed       Past Medical History:  Diagnosis Date  . Arthritis   . Asthma   . Chest pain    cath 2005 norm cors, repeat cath Feb 2011 normal cors and only minimally  elevated pulmonary pressures  . Crohn disease ([place])   . Diverticulosis   . GERD (gastroesophageal reflux disease)   . Hiatal hernia   . Hypertension   . Obesity   . Palpitations   . Sleep apnea sleep study 11/03/2009   mild-no cpap; does sometimes, doesnt use every night.    Past Surgical History:  Procedure Laterality Date  . ABDOMINAL HYSTERECTOMY    . ABDOMINAL HYSTERECTOMY    . ANKLE ARTHROSCOPY  06/01/2012   Procedure: ANKLE ARTHROSCOPY;  Surgeon: Colin Rhein, MD;  Location: [facility];  Service: Orthopedics;  Laterality: Left;  left ankle arthorsocpy with extensive debridement and gastroc slide  . CARDIAC CATHETERIZATION  11/22/2009 and 2005   WNL  . CESAREAN SECTION    . CHOLECYSTECTOMY  09/08/2006   lap. chole.  . CHONDROPLASTY  06/17/2012   Procedure: CHONDROPLASTY;  Surgeon: Carole Civil, MD;  [facility];  Service: Orthopedics;   Laterality: Right;  right patella  . ESOPHAGOGASTRODUODENOSCOPY N/A 05/14/2015   Procedure: ESOPHAGOGASTRODUODENOSCOPY (EGD);  Surgeon: Rogene Houston, MD;  [facility];  Service: Endoscopy;  Laterality: N/A;  730  . INGUINAL HERNIA REPAIR  10/30/2008   right  . KNEE ARTHROSCOPY WITH LATERAL MENISECTOMY Left 12/23/2016   Procedure: LEFT KNEE ARTHROSCOPY WITH LATERAL MENISECTOMY;  Surgeon: Carole Civil, MD;  [facility];  Service: Orthopedics;  Laterality: Left;  . KNEE ARTHROSCOPY WITH MEDIAL MENISECTOMY Left 12/23/2016   Procedure: LEFT KNEE ARTHROSCOPY WITH MEDIAL MENISECTOMY CHONDROPLASTY PATELLA  AND MEDIAL FEMORAL CONDYLE LEFT KNEE;  Surgeon: Carole Civil, MD;  Location[facility];  Service: Orthopedics;  Laterality: Left;  . SHOULDER SURGERY     right x 2     There were no vitals filed for this visit.  Subjective Assessment - 11/16/17 0907    Subjective  Pt states that the rain is causing her ankle and knee to be uncomfortable.    Pertinent History  R knee scope in 2013, L knee scope in March 2018    Limitations  Standing;Walking    How long can you sit comfortably?  no issues    How long can you stand comfortably?  30-60 minutes  How long can you walk comfortably?  30-60 minutes    Diagnostic tests  x-rays    Patient Stated Goals  get better, get back as close to pain free as possible    Currently in Pain?  Yes    Pain Score  7     Pain Location  Knee    Pain Orientation  Left    Pain Descriptors / Indicators  Aching    Pain Type  Acute pain    Pain Onset  More than a month ago    Pain Frequency  Intermittent    Aggravating Factors   walking     Pain Relieving Factors  ice     Effect of Pain on Daily Activities  decreases     Multiple Pain Sites  Yes    Pain Score  7    Pain Location  Ankle    Pain Orientation  Left    Pain Descriptors / Indicators  Aching    Pain Type  Acute pain    Pain Onset  More than a month ago    Aggravating Factors    walking     Pain Relieving Factors  ice, elevation     Effect of Pain on Daily Activities  slows                       [facility] Adult PT Treatment/Exercise - 11/16/17 0910      Knee/Hip Exercises: Stretches   Gastroc Stretch  Both;3 reps;30 seconds    Gastroc Stretch Limitations  slant board      Knee/Hip Exercises: Standing   Heel Raises  15 reps    Forward Lunges  Left;10 reps onto 4" step     Hip Abduction  10 reps    Abduction Limitations  BTB Both    Lateral Step Up  Left;10 reps;Hand Hold: 2    Forward Step Up  Left;10 reps;Hand Hold: 2    Functional Squat  10 reps    Rocker Board  2 minutes    SLS  x5 each     Other Standing Knee Exercises  Baps level 2      Ankle Exercises: Seated   BAPS  Level 3;10 reps    Other Seated Ankle Exercises  4-way ankle with green theraband, 10 reps each direction               PT Short Term Goals - 11/09/17 0830      PT SHORT TERM GOAL #1   Title  Pt will be independent with HEP and perform consistently in order to maximize return to PLOF.    Baseline  11/09/17 - reports performing some exercises every day    Time  2    Period  Weeks    Status  Achieved      PT SHORT TERM GOAL #2   Title  Pt will have improved ankle DF ROM to at least 5 deg in order to decrease pain and maximize gait and balance.    Time  2    Period  Weeks    Status  Achieved      PT SHORT TERM GOAL #3   Title  Pt will report being able to sleep through the night without awakening due to knee or ankle pain in order to maximize recovery.    Baseline  11/09/17 - if turn the worng way or move too quick, more becaus eof knee and shoulder  Time  2    Period  Weeks    Status  On-going    Target Date  11/23/17        PT Long Term Goals - 11/09/17 0831      PT LONG TERM GOAL #1   Title  Pt will have improved SLS to 10sec on LLE to demo improved stability and proprioception of L ankle and maximize gait.    Baseline  11/09/17 - RtLE: 20 secodns,  LtLE: 7 seconds    Time  4    Period  Weeks    Status  On-going    Target Date  12/07/17      PT LONG TERM GOAL #2   Title  Pt will have improved overall MMT of BLE by at least a 1/2 grade in order to decrease overall pain and allow pt to perform functional tasks with greater ease.     Baseline  11/09/17 - improved all but left hip abduction    Time  4    Period  Weeks    Status  Partially Met    Target Date  12/07/17      PT LONG TERM GOAL #3   Title  Pt will report improved tolerance to standing and walking by 50% or > (to at least 40 mins to 1 hour) without 2/10 ankle and knee pain in order to maximize her function at work.    Baseline  11/09/17 - ambulateing 30-60 minutes before requiring a break    Time  4    Period  Weeks    Status  Partially Met    Target Date  12/07/17      PT LONG TERM GOAL #4   Title  Pt will be able to ascend/descend full flight of stairs reciprocally with no handrail and without knee or ankle pain in order to demo improved functional strength and maximize function at home and work.     Baseline  11/09/17 - step to pattern with 1-2 handrails, pain with 4-8 stairs    Time  4    Period  Weeks    Status  On-going    Target Date  12/07/17            Plan - 11/16/17 4193    Clinical Impression Statement  Added step ups, squats, rockerboard and standing BAPs to program to improve both stability and strength with minimal but constant cuing neede for pt to complete exercises correctly.  PT continues to be limited by ankle discomfort.     Rehab Potential  Good    PT Frequency  2x / week    PT Duration  4 weeks    PT Treatment/Interventions  ADLs/Self Care Home Management;Cryotherapy;Electrical Stimulation;Moist Heat;Traction;Gait training;Stair training;Functional mobility training;Therapeutic activities;Therapeutic exercise;Balance training;Neuromuscular re-education;Cognitive remediation;Patient/family education;Manual techniques;Passive range of motion;Dry  needling;Energy conservation;Taping    PT Next Visit Plan  Begin stair climbing; reassess . Continue strengthening for Lt knee and ankle and progress to functional BLE strengthening, manual for pain/STM/ankle joint mobs for ROM, balance and stability/proprioceptive work, continue OKC or unweighted squat wiht total gym for quad/VMO strenghtening    PT Home Exercise Plan  eval: wall walking, calf stretching; 10/27/17:  bridge, abduction and hip extension, heel raises; 11/09/17 - 4 way ankle with green TB       Patient will benefit from skilled therapeutic intervention in order to improve the following deficits and impairments:  Abnormal gait, Decreased activity tolerance, Decreased balance, Decreased mobility, Decreased range of  motion, Decreased strength, Difficulty walking, Hypomobility, Increased muscle spasms, Increased fascial restricitons, Impaired UE functional use, Improper body mechanics, Postural dysfunction, Pain  Visit Diagnosis: Pain in left ankle and joints of left foot  Chronic pain of left knee  Muscle weakness (generalized)  Stiffness of left knee, not elsewhere classified     Problem List Patient Active Problem List   Diagnosis Date Noted  . Derangement of anterior horn of lateral meniscus of left knee   . Derangement of posterior horn of medial meniscus of left knee   . Chest pain 04/25/2016  . Hypokalemia 04/25/2016  . Hyperglycemia 04/25/2016  . Diverticulitis of colon 07/17/2014  . Nausea without vomiting 07/17/2014  . Crohn disease ([place]) 07/17/2014  . Diverticulitis of colon without hemorrhage 08/10/2013  . History of arthroscopy of right knee 07/25/2012  . Difficulty in walking(719.7) 06/29/2012  . Weakness of right leg 06/29/2012  . Posterior tibial tendonitis 11/11/2011  . PTTD (posterior tibial tendon dysfunction) 11/11/2011  . Knee pain, right 11/11/2011  . Chest pain 07/16/2011  . Primary osteoarthritis of left knee 02/25/2010  . PAIN IN JOINT, MULTIPLE  SITES 02/25/2010  . Essential hypertension 08/13/2009  . HIATAL HERNIA 08/13/2009  . PALPITATIONS 08/13/2009  . DYSPNEA 08/13/2009    Rayetta Humphrey, PT CLT [phone] 11/16/2017, 9:45 AM  [facility] [address] [place], [place], [address] Phone: [phone]   Fax:  [phone]  Name: [name] MRN: [id number] Date of Birth: [birth year]

## 2017-11-16 NOTE — Therapy (Addendum)
[place], [place], [address] Phone: [phone]   Fax:  [phone]  Occupational Therapy Treatment  Patient Details  Name: [name] MRN: [id number] Date of Birth: 1966-04-22 Referring Provider: Dr. Geralynn Rile   Encounter Date: 11/16/2017  OT End of Session - 11/16/17 0901    Visit Number  3    Number of Visits  8    Date for OT Re-Evaluation  12/10/17    Authorization Type  workers compensation approved 8 visits for her shoulder.      Authorization - Visit Number  3    Authorization - Number of Visits  8    OT Start Time  [phone] patient arrived late for visit    OT Stop Time  0901    OT Time Calculation (min)  37 min    Activity Tolerance  Patient tolerated treatment well    Behavior During Therapy  [facility] for tasks assessed/performed       Past Medical History:  Diagnosis Date  . Arthritis   . Asthma   . Chest pain    cath 2005 norm cors, repeat cath Feb 2011 normal cors and only minimally  elevated pulmonary pressures  . Crohn disease ([place])   . Diverticulosis   . GERD (gastroesophageal reflux disease)   . Hiatal hernia   . Hypertension   . Obesity   . Palpitations   . Sleep apnea sleep study 11/03/2009   mild-no cpap; does sometimes, doesnt use every night.    Past Surgical History:  Procedure Laterality Date  . ABDOMINAL HYSTERECTOMY    . ABDOMINAL HYSTERECTOMY    . ANKLE ARTHROSCOPY  06/01/2012   Procedure: ANKLE ARTHROSCOPY;  Surgeon: Colin Rhein, MD;  Location: [place];  Service: Orthopedics;  Laterality: Left;  left ankle arthorsocpy with extensive debridement and gastroc slide  . CARDIAC CATHETERIZATION  11/22/2009 and 2005   WNL  . CESAREAN SECTION    . CHOLECYSTECTOMY  09/08/2006   lap. chole.  . CHONDROPLASTY  06/17/2012   Procedure: CHONDROPLASTY;  Surgeon: Carole Civil, MD;  [facility];  Service: Orthopedics;  Laterality: Right;  right patella   . ESOPHAGOGASTRODUODENOSCOPY N/A 05/14/2015   Procedure: ESOPHAGOGASTRODUODENOSCOPY (EGD);  Surgeon: Rogene Houston, MD;  [facility];  Service: Endoscopy;  Laterality: N/A;  730  . INGUINAL HERNIA REPAIR  10/30/2008   right  . KNEE ARTHROSCOPY WITH LATERAL MENISECTOMY Left 12/23/2016   Procedure: LEFT KNEE ARTHROSCOPY WITH LATERAL MENISECTOMY;  Surgeon: Carole Civil, MD;  [facility];  Service: Orthopedics;  Laterality: Left;  . KNEE ARTHROSCOPY WITH MEDIAL MENISECTOMY Left 12/23/2016   Procedure: LEFT KNEE ARTHROSCOPY WITH MEDIAL MENISECTOMY CHONDROPLASTY PATELLA  AND MEDIAL FEMORAL CONDYLE LEFT KNEE;  Surgeon: Carole Civil, MD;  [facility];  Service: Orthopedics;  Laterality: Left;  . SHOULDER SURGERY     right x 2     There were no vitals filed for this visit.  Subjective Assessment - 11/16/17 0901    Subjective   S: I think we are waking up this shoulder.      Currently in Pain?  Yes    Pain Score  7     Pain Location  Shoulder    Pain Orientation  Left    Pain Descriptors / Indicators  Tingling    Pain Type  Acute pain    Pain Radiating Towards  elbow and hand  Pain Onset  More than a month ago    Pain Frequency  Intermittent         [facility] OT Assessment - 11/16/17 0001      Assessment   Medical Diagnosis  Left Shoulder Pain      Precautions   Precautions  None               OT Treatments/Exercises (OP) - 11/16/17 0001      Exercises   Exercises  Shoulder      Shoulder Exercises: Supine   Protraction  PROM;AROM;10 reps    Horizontal ABduction  PROM;AROM;10 reps    External Rotation  PROM;AROM;10 reps    Internal Rotation  PROM;AROM;10 reps    Flexion  PROM;AROM;10 reps    ABduction  PROM;AROM;10 reps      Shoulder Exercises: Seated   Protraction  AROM;10 reps    Horizontal ABduction  AROM;10 reps    External Rotation  AROM;10 reps    Internal Rotation  AROM;10 reps    Flexion  AROM;10 reps    Abduction  AROM;10  reps verbal cuing for technique on descent      Shoulder Exercises: Standing   Extension  Theraband;10 reps    Theraband Level (Shoulder Extension)  Level 2 (Red)    Row  Theraband;10 reps    Theraband Level (Shoulder Row)  Level 2 (Red)    Retraction  Theraband;10 reps    Theraband Level (Shoulder Retraction)  Level 2 (Red)      Shoulder Exercises: ROM/Strengthening   Wall Wash  1'    Other ROM/Strengthening Exercises  wrote alphabet on wall with arm flexed to 90 and elbow fully extended with verbal guidance for correct arm positioning       Manual Therapy   Manual Therapy  Myofascial release    Manual therapy comments  completed seperately from all other skilled interventions    Myofascial Release  myofascial release to left shoulder, scapular, and upper arm region to decrease pain and improve pain free mobility.                OT Short Term Goals - 11/12/17 0539      OT SHORT TERM GOAL #1   Title  Patient will be educated and independent with HEP for left shoulder mobility for improved abiilty to reach overhead.     Time  4    Period  Weeks    Status  On-going      OT SHORT TERM GOAL #2   Title  Patient will improve left shoulder A/ROM to [facility] for improved ability to don and doff coats without pain.     Time  4    Period  Weeks    Status  On-going      OT SHORT TERM GOAL #3   Title  Patient will improve left shoulder strength to 4+/5 or better for improved ability to lift items at work.      Time  4    Period  Weeks    Status  On-going      OT SHORT TERM GOAL #4   Title  Patient will improve left shoulder pain to 3/10 or less while completing work tasks.     Time  4    Period  Weeks    Status  On-going      OT SHORT TERM GOAL #5   Title  Patient will decrease stiffness and fascial restrictions in her left shoulder region  to trace for improved ability to complete ADLs and work activities pain free.     Time  4    Period  Weeks    Status  On-going                Plan - 11/16/17 0902    Clinical Impression Statement  A:  Patient able to complete A/ROM in supine and seated this date.  With abduction in seated position, patient has dropping sensation in descent from abduction to adduction     Plan  P:  increase repetitions with a/rom, attempt sidelying abduction for increased resistance, without as much pain as seated.         Patient will benefit from skilled therapeutic intervention in order to improve the following deficits and impairments:  Pain, Decreased range of motion, Decreased strength, Impaired UE functional use  Visit Diagnosis: Stiffness of left shoulder, not elsewhere classified  Acute pain of left shoulder  Other symptoms and signs involving the musculoskeletal system    Problem List Patient Active Problem List   Diagnosis Date Noted  . Derangement of anterior horn of lateral meniscus of left knee   . Derangement of posterior horn of medial meniscus of left knee   . Chest pain 04/25/2016  . Hypokalemia 04/25/2016  . Hyperglycemia 04/25/2016  . Diverticulitis of colon 07/17/2014  . Nausea without vomiting 07/17/2014  . Crohn disease ([place]) 07/17/2014  . Diverticulitis of colon without hemorrhage 08/10/2013  . History of arthroscopy of right knee 07/25/2012  . Difficulty in walking(719.7) 06/29/2012  . Weakness of right leg 06/29/2012  . Posterior tibial tendonitis 11/11/2011  . PTTD (posterior tibial tendon dysfunction) 11/11/2011  . Knee pain, right 11/11/2011  . Chest pain 07/16/2011  . Primary osteoarthritis of left knee 02/25/2010  . PAIN IN JOINT, MULTIPLE SITES 02/25/2010  . Essential hypertension 08/13/2009  . HIATAL HERNIA 08/13/2009  . PALPITATIONS 08/13/2009  . DYSPNEA 08/13/2009    Vangie Bicker, MHA, OTR/L [phone] OCCUPATIONAL THERAPY DISCHARGE SUMMARY  Visits from Start of Care: 3  Current functional level related to goals / functional outcomes: See above   Remaining  deficits: Shoulder pain, limited mobility and strength in shoulder   Education / Equipment: See above Plan: Patient agrees to discharge.  Patient goals were not met. Patient is being discharged due to the physician's request.  ?????       Vangie Bicker, [place], OTR/L [phone]    11/16/2017, 9:08 AM  [place] [address] [place], [place], [address] Phone: [phone]   Fax:  [phone]  Name: ETHELINE GEPPERT MRN: [id number] Date of Birth: 1966/09/04

## 2017-11-17 ENCOUNTER — Encounter (HOSPITAL_COMMUNITY): Payer: Self-pay

## 2017-11-18 ENCOUNTER — Ambulatory Visit (HOSPITAL_COMMUNITY): Payer: PRIVATE HEALTH INSURANCE

## 2017-11-18 ENCOUNTER — Encounter (HOSPITAL_COMMUNITY): Payer: Self-pay

## 2017-11-19 ENCOUNTER — Telehealth (HOSPITAL_COMMUNITY): Payer: Self-pay | Admitting: Specialist

## 2017-11-19 NOTE — Telephone Encounter (Signed)
Spoke to [name] in [employer] -pls tell pt to have HR to call them with the correct Worker's Comp Information and WC-Account number. NF 11/19/17

## 2017-11-22 ENCOUNTER — Telehealth (HOSPITAL_COMMUNITY): Payer: Self-pay | Admitting: Family Medicine

## 2017-11-22 NOTE — Telephone Encounter (Signed)
11/22/17  pt called and said she had been to the dr and he wants her to stop all OT because she will be having surgery on her shoulder

## 2017-11-24 ENCOUNTER — Ambulatory Visit (HOSPITAL_COMMUNITY): Payer: PRIVATE HEALTH INSURANCE

## 2017-11-24 ENCOUNTER — Encounter (HOSPITAL_COMMUNITY): Payer: Self-pay

## 2017-11-24 ENCOUNTER — Ambulatory Visit (HOSPITAL_COMMUNITY): Payer: PRIVATE HEALTH INSURANCE | Admitting: Specialist

## 2017-11-24 DIAGNOSIS — M25572 Pain in left ankle and joints of left foot: Secondary | ICD-10-CM

## 2017-11-24 DIAGNOSIS — M25562 Pain in left knee: Secondary | ICD-10-CM

## 2017-11-24 DIAGNOSIS — G8929 Other chronic pain: Secondary | ICD-10-CM | POA: Diagnosis not present

## 2017-11-24 DIAGNOSIS — R29898 Other symptoms and signs involving the musculoskeletal system: Secondary | ICD-10-CM | POA: Diagnosis not present

## 2017-11-24 DIAGNOSIS — M25662 Stiffness of left knee, not elsewhere classified: Secondary | ICD-10-CM | POA: Diagnosis not present

## 2017-11-24 DIAGNOSIS — M25512 Pain in left shoulder: Secondary | ICD-10-CM | POA: Diagnosis not present

## 2017-11-24 DIAGNOSIS — M6281 Muscle weakness (generalized): Secondary | ICD-10-CM | POA: Diagnosis not present

## 2017-11-24 DIAGNOSIS — M25612 Stiffness of left shoulder, not elsewhere classified: Secondary | ICD-10-CM | POA: Diagnosis not present

## 2017-11-24 NOTE — Therapy (Signed)
[place] [address] [place], [place], [address] Phone: [phone]   Fax:  [phone]  Physical Therapy Treatment/Discharge Summary  Patient Details  Name: [name] MRN: [id number] Date of Birth: 11-05-1965 Referring Provider: Geralynn Rile   Encounter Date: 11/24/2017  PHYSICAL THERAPY DISCHARGE SUMMARY  Visits from Start of Care: 10  Current functional level related to goals / functional outcomes: Clinical Impression per Ihor Austin: "Pt arrived with reports of shoulder surgery and following discussion with MD, ready for DC to HEP.  Reviewed goals with the following findings:  Pt continues to be limited by pain with reports of increased pain following long work schedule.  MMT has improved all muscles except for hip extension and AROM improved in most directions.  Pt reports compliance iwht HEP and able to verbalize/demonstrate all exercises.  Pt continues to be impaired with functional movements due to pain, especially descending step to pattern wtih stairs (also requires HHA).  Pt DC to HEP per MD."   Remaining deficits: See below details.   Education / Equipment: See education details below.  Plan: Patient agrees to discharge.  Patient goals were partially met. Patient is being discharged due to the patient's request.  ?????     Kipp Brood, PT, DPT Physical Therapist with [facility]  11/24/2017 7:44 PM     PT End of Session - 11/24/17 1809    Visit Number  10    Number of Visits  17    Date for PT Re-Evaluation  12/09/17    Authorization Type  Worker's Comp (8 visits approved per body part, 24 total visits)    Authorization Time Period  11/09/17-12/04/17 (8 more visits)    Authorization - Visit Number  10    Authorization - Number of Visits  16    PT Start Time  1736    PT Stop Time  1805    PT Time Calculation (min)  29 min    Activity Tolerance  Patient tolerated treatment  well    Behavior During Therapy  WFL for tasks assessed/performed       Past Medical History:  Diagnosis Date  . Arthritis   . Asthma   . Chest pain    cath 2005 norm cors, repeat cath Feb 2011 normal cors and only minimally  elevated pulmonary pressures  . Crohn disease ([place])   . Diverticulosis   . GERD (gastroesophageal reflux disease)   . Hiatal hernia   . Hypertension   . Obesity   . Palpitations   . Sleep apnea sleep study 11/03/2009   mild-no cpap; does sometimes, doesnt use every night.    Past Surgical History:  Procedure Laterality Date  . ABDOMINAL HYSTERECTOMY    . ABDOMINAL HYSTERECTOMY    . ANKLE ARTHROSCOPY  06/01/2012   Procedure: ANKLE ARTHROSCOPY;  Surgeon: Colin Rhein, MD;  Location: [place];  Service: Orthopedics;  Laterality: Left;  left ankle arthorsocpy with extensive debridement and gastroc slide  . CARDIAC CATHETERIZATION  11/22/2009 and 2005   WNL  . CESAREAN SECTION    . CHOLECYSTECTOMY  09/08/2006   lap. chole.  . CHONDROPLASTY  06/17/2012   Procedure: CHONDROPLASTY;  Surgeon: Carole Civil, MD;  [facility];  Service: Orthopedics;  Laterality: Right;  right patella  . ESOPHAGOGASTRODUODENOSCOPY N/A 05/14/2015   Procedure: ESOPHAGOGASTRODUODENOSCOPY (EGD);  Surgeon: Rogene Houston, MD;  [facility];  Service: Endoscopy;  Laterality:  N/A;  730  . INGUINAL HERNIA REPAIR  10/30/2008   right  . KNEE ARTHROSCOPY WITH LATERAL MENISECTOMY Left 12/23/2016   Procedure: LEFT KNEE ARTHROSCOPY WITH LATERAL MENISECTOMY;  Surgeon: Carole Civil, MD;  [facility];  Service: Orthopedics;  Laterality: Left;  . KNEE ARTHROSCOPY WITH MEDIAL MENISECTOMY Left 12/23/2016   Procedure: LEFT KNEE ARTHROSCOPY WITH MEDIAL MENISECTOMY CHONDROPLASTY PATELLA  AND MEDIAL FEMORAL CONDYLE LEFT KNEE;  Surgeon: Carole Civil, MD;  [facility];  Service: Orthopedics;  Laterality: Left;  . SHOULDER SURGERY     right x 2      There were no vitals filed for this visit.  Subjective Assessment - 11/24/17 1740    Subjective  Pt reports discussion with MD about surgery for Lt shoulder and MD stated plans to discharge therapy and continue HEP.  Reports Lt ankle pain scale 5/10, painful with weight bearing and feels the weather plays a part as well.      Pertinent History  R knee scope in 2013, L knee scope in March 2018    How long can you sit comfortably?  no issues    How long can you stand comfortably?  (30 minutes prior pain) 30-60 minutes    How long can you walk comfortably?  (30 minutes) 30-60 minutes    Diagnostic tests  x-rays    Patient Stated Goals  get better, get back as close to pain free as possible    Currently in Pain?  Yes    Pain Score  5     Pain Location  Ankle    Pain Orientation  Left    Pain Type  Acute pain    Pain Onset  More than a month ago    Pain Frequency  Intermittent    Aggravating Factors   walking    Pain Relieving Factors  ice    Effect of Pain on Daily Activities  decreases         [facility] PT Assessment - 11/24/17 0001      Assessment   Medical Diagnosis  patellofemoral syndrome of L knee, patellar tendonitis of left knee, bursitis of left shoulder, pain and swelling in L ankle    Referring Provider  Geralynn Rile    Onset Date/Surgical Date  07/31/17    Next MD Visit  -- following surgery, unsure dates    Prior Therapy  yes for L knee scope earlier this year      AROM   Left Knee Extension  -- was 0    Left Knee Flexion  -- was 110    Right Ankle Dorsiflexion  -- was 10    Right Ankle Plantar Flexion  -- was 60    Right Ankle Inversion  -- was 35    Right Ankle Eversion  -- was 25    Left Ankle Dorsiflexion  23 was 12    Left Ankle Plantar Flexion  60 was 60    Left Ankle Inversion  35 was 30    Left Ankle Eversion  28 was 20      Strength   Right Hip Flexion  5/5 was 4+    Right Hip Extension  4/5 was 4+    Right Hip ABduction  5/5    Left Hip Flexion   4+/5 was 4/5    Left Hip Extension  4/5 was 4/5    Left Hip ABduction  5/5 was 4+/5    Right Knee Flexion  5/5    Right Knee Extension  5/5    Left Knee Flexion  5/5    Left Knee Extension  5/5    Right Ankle Dorsiflexion  5/5    Right Ankle Inversion  5/5    Right Ankle Eversion  5/5    Left Ankle Dorsiflexion  5/5    Left Ankle Inversion  5/5 was 4+/5    Left Ankle Eversion  5/5          PT Education - 11/24/17 1813    Education provided  Yes    Education Details  Reviewed importance of continuing HEP for maximal benefits; educated on step to pattern descending stairs for Lt knee pain.      Person(s) Educated  Patient    Methods  Explanation;Demonstration    Comprehension  Returned demonstration;Verbalized understanding       PT Short Term Goals - 11/24/17 1746      PT SHORT TERM GOAL #1   Title  Pt will be independent with HEP and perform consistently in order to maximize return to PLOF.    Baseline  11/09/17 - reports performing some exercises every day    Status  Achieved      PT SHORT TERM GOAL #2   Title  Pt will have improved ankle DF ROM to at least 5 deg in order to decrease pain and maximize gait and balance.    Status  Achieved      PT SHORT TERM GOAL #3   Title  Pt will report being able to sleep through the night without awakening due to knee or ankle pain in order to maximize recovery.    Baseline  11/24/17:  if turns the wrong way or move too quick, more because of knee and shoulder    Status  On-going        PT Long Term Goals - 11/24/17 1747      PT LONG TERM GOAL #1   Title  Pt will have improved SLS to 10sec on LLE to demo improved stability and proprioception of L ankle and maximize gait.    Baseline  11/24/17:  Limited by pain Lt 11", Rt 16" max    Status  Partially Met      PT LONG TERM GOAL #2   Title  Pt will have improved overall MMT of BLE by at least a 1/2 grade in order to decrease overall pain and allow pt to perform functional tasks  with greater ease.     Baseline  11/24/17:  improved all but hip extension    Status  Partially Met      PT LONG TERM GOAL #3   Title  Pt will report improved tolerance to standing and walking by 50% or > (to at least 40 mins to 1 hour) without 2/10 ankle and knee pain in order to maximize her function at work.    Baseline  11/24/17- ambulateing 30-60 minutes before requiring a break    Status  On-going      PT LONG TERM GOAL #4   Title  Pt will be able to ascend/descend full flight of stairs reciprocally with no handrail and without knee or ankle pain in order to demo improved functional strength and maximize function at home and work.     Baseline  02/20: demonstrates step to pattern with 1-2 handrails descending stairs, painful    Status  On-going            Plan -  11/24/17 1810    Clinical Impression Statement  Pt arrived with reports of shoulder surgery and following discussion with MD, ready for DC to HEP.  Reviewed goals with the following findings:  Pt continues to be limited by pain with reports of increased pain following long work schedule.  MMT has improved all muscles except for hip extension and AROM improved in most directions.  Pt reports compliance iwht HEP and able to verbalize/demonstrate all exercises.  Pt continues to be impaired with functional movements due to pain, especially descending step to pattern wtih stairs (also requires HHA).  Pt DC to HEP per MD.      Rehab Potential  Good    PT Frequency  2x / week    PT Duration  4 weeks    PT Treatment/Interventions  ADLs/Self Care Home Management;Cryotherapy;Electrical Stimulation;Moist Heat;Traction;Gait training;Stair training;Functional mobility training;Therapeutic activities;Therapeutic exercise;Balance training;Neuromuscular re-education;Cognitive remediation;Patient/family education;Manual techniques;Passive range of motion;Dry needling;Energy conservation;Taping    PT Next Visit Plan  DC to HEP per MD    PT  Home Exercise Plan  eval: wall walking, calf stretching; 10/27/17:  bridge, abduction and hip extension, heel raises; 11/09/17 - 4 way ankle with green TB; 02/20: hip extension with theraband       Patient will benefit from skilled therapeutic intervention in order to improve the following deficits and impairments:  Abnormal gait, Decreased activity tolerance, Decreased balance, Decreased mobility, Decreased range of motion, Decreased strength, Difficulty walking, Hypomobility, Increased muscle spasms, Increased fascial restricitons, Impaired UE functional use, Improper body mechanics, Postural dysfunction, Pain  Visit Diagnosis: Pain in left ankle and joints of left foot  Chronic pain of left knee  Muscle weakness (generalized)     Problem List Patient Active Problem List   Diagnosis Date Noted  . Derangement of anterior horn of lateral meniscus of left knee   . Derangement of posterior horn of medial meniscus of left knee   . Chest pain 04/25/2016  . Hypokalemia 04/25/2016  . Hyperglycemia 04/25/2016  . Diverticulitis of colon 07/17/2014  . Nausea without vomiting 07/17/2014  . Crohn disease ([place]) 07/17/2014  . Diverticulitis of colon without hemorrhage 08/10/2013  . History of arthroscopy of right knee 07/25/2012  . Difficulty in walking(719.7) 06/29/2012  . Weakness of right leg 06/29/2012  . Posterior tibial tendonitis 11/11/2011  . PTTD (posterior tibial tendon dysfunction) 11/11/2011  . Knee pain, right 11/11/2011  . Chest pain 07/16/2011  . Primary osteoarthritis of left knee 02/25/2010  . PAIN IN JOINT, MULTIPLE SITES 02/25/2010  . Essential hypertension 08/13/2009  . HIATAL HERNIA 08/13/2009  . PALPITATIONS 08/13/2009  . DYSPNEA 08/13/2009   Ihor Austin, [place]; [place]  Aldona Lento 11/24/2017, 6:15 PM  [facility] [address] [place], [place], [address] Phone: [phone]   Fax:   [phone]  Name: CHERYLE DARK MRN: [id number] Date of Birth: 10-20-1965

## 2017-11-26 ENCOUNTER — Encounter (HOSPITAL_COMMUNITY): Payer: Self-pay

## 2017-12-01 ENCOUNTER — Encounter (HOSPITAL_COMMUNITY): Payer: Self-pay | Admitting: Occupational Therapy

## 2017-12-01 ENCOUNTER — Encounter (HOSPITAL_COMMUNITY): Payer: Self-pay

## 2017-12-01 MED FILL — GABAPENTIN 300 MG CAPSULE: 300 | 30 days supply | Qty: 30 | Fill #4

## 2017-12-01 MED FILL — ALBUTEROL 0.083% INHAL SOLN: (2.5 MG/3ML | 6 days supply | Qty: 75 | Fill #1

## 2017-12-02 ENCOUNTER — Ambulatory Visit (HOSPITAL_COMMUNITY): Payer: Self-pay

## 2017-12-07 ENCOUNTER — Encounter (HOSPITAL_COMMUNITY): Payer: Self-pay

## 2017-12-10 ENCOUNTER — Encounter (HOSPITAL_COMMUNITY): Payer: Self-pay | Admitting: Occupational Therapy

## 2017-12-13 ENCOUNTER — Telehealth (INDEPENDENT_AMBULATORY_CARE_PROVIDER_SITE_OTHER): Payer: Self-pay | Admitting: Internal Medicine

## 2017-12-13 DIAGNOSIS — K5732 Diverticulitis of large intestine without perforation or abscess without bleeding: Secondary | ICD-10-CM

## 2017-12-13 MED ORDER — CIPROFLOXACIN HCL 500 MG PO TABS: 500.0000 mg | ORAL_TABLET | Freq: Two times a day (BID) | ORAL | 0 refills | Status: DC

## 2017-12-13 MED ORDER — METRONIDAZOLE 500 MG PO TABS: 500.0000 mg | ORAL_TABLET | Freq: Two times a day (BID) | ORAL | 0 refills | Status: DC

## 2017-12-13 MED ORDER — FLUCONAZOLE 100 MG PO TABS
100.0000 mg | ORAL_TABLET | Freq: Every day | ORAL | 1 refills | Status: DC
Start: 2017-12-13 — End: 2018-02-04

## 2017-12-13 NOTE — Telephone Encounter (Signed)
Please let patient know Rx sent

## 2017-12-13 NOTE — Telephone Encounter (Signed)
Patient left message requesting refill on her diverticulitis medication - phone# [phone]

## 2017-12-13 NOTE — Telephone Encounter (Signed)
Please let patient know Rx for Diflucan sent

## 2017-12-13 NOTE — Telephone Encounter (Signed)
Rx for Cipro and Flagyl sent to [facility].  Patient is aware that if pain worsens or she runs a fever to go to the ED

## 2017-12-13 NOTE — Telephone Encounter (Signed)
Patient called back stating she needs a prescription written for diflucan

## 2017-12-13 NOTE — Telephone Encounter (Signed)
I spoke with patient this am. She feels she is having a diverticular flare. Rx for flagyl and Cipro sent to [facility]. Patient know to go to the ED if pain worsens or she began's to run a fever.

## 2017-12-14 MED FILL — MONTELUKAST SOD 10 MG TAB: 10 | 30 days supply | Qty: 30 | Fill #1 | Status: TO

## 2017-12-14 MED FILL — MELOXICAM 7.5 MG TABLET: 7.5 | 30 days supply | Qty: 60 | Fill #2

## 2017-12-14 NOTE — Progress Notes (Addendum)
PCP: Sharilyn Sites, MD  Cardiologist: Dr. Glori Bickers -in the past, not seeing anyone currently  EKG: denies past year, obtained today  Stress test: pt denies  ECHO:04/27/16  Cardiac Cath: 2005 and 11/22/2009-in epic -pt reports that her first was normal and her second showed an increaesed Ejection fraction-performed by Dr. Dannielle Burn  Chest x-ray:11/10/17 in [facility]

## 2017-12-14 NOTE — Pre-Procedure Instructions (Signed)
[name]  12/14/2017      [place], [place] - [address] [facility] [address] [place] [address] Phone: [phone] Fax: [name], [name] [address] [name] [place] [address] Phone: [phone] Fax: [phone]    Your procedure is scheduled on December 21, 2017.  Report to [name] Admitting at 1030 AM.  Call this number if you have problems the morning of surgery:  [phone]   Remember:  Do not eat food or drink liquids after midnight.  Take these medicines the morning of surgery with A SIP OF WATER albuterol inhaler, symbicort inhaler (bring inhalers with you), abluterol nebulizer-if needed, amlodipine (norvasc), cetirizine (zyrtec), ciprofloxacin (cipro), cyclobenzaprin (flexeril)-if needed, famotidine (pepcid), fluconazole (diflucan), flonase nasal spray, metronidazole (flagyl), ondansteron zofran)-if needed for nausea, pantoprazole (protonix)-if needed for reflux,   7 days prior to surgery STOP taking any meloxicam (mobic), diclofenac (voltaren) gel, Aspirin (unless otherwise instructed by your surgeon), Aleve, Naproxen, Ibuprofen, Motrin, Advil, Goody's, BC's, all herbal medications, fish oil, and all vitamins  Continue all other medications as instructed by your physician except follow the above medication instructions before surgery   Do not wear jewelry, make-up or nail polish.  Do not wear lotions, powders, or perfumes, or deodorant.  Do not shave 48 hours prior to surgery.    Do not bring valuables to the [name].  [name] is not responsible for any belongings or valuables.  Contacts, dentures or bridgework may not be worn into surgery.  Leave your suitcase in the car.  After surgery it may be brought to your room.  For patients admitted to the [name], discharge time will be determined by your treatment team.  Patients  discharged the day of surgery will not be allowed to drive home.   Special instructions:  [name]- Preparing For Surgery  Before surgery, you can play an important role. Because skin is not sterile, your skin needs to be as free of germs as possible. You can reduce the number of germs on your skin by washing with CHG (chlorahexidine gluconate) Soap before surgery.  CHG is an antiseptic cleaner which kills germs and bonds with the skin to continue killing germs even after washing.  Please do not use if you have an allergy to CHG or antibacterial soaps. If your skin becomes reddened/irritated stop using the CHG.  Do not shave (including legs and underarms) for at least 48 hours prior to first CHG shower. It is OK to shave your face.  Please follow these instructions carefully.   1. Shower the NIGHT BEFORE SURGERY and the MORNING OF SURGERY with CHG.   2. If you chose to wash your hair, wash your hair first as usual with your normal shampoo.  3. After you shampoo, rinse your hair and body thoroughly to remove the shampoo.  4. Use CHG as you would any other liquid soap. You can apply CHG directly to the skin and wash gently with a scrungie or a clean washcloth.   5. Apply the CHG Soap to your body ONLY FROM THE NECK DOWN.  Do not use on open wounds or open sores. Avoid contact with your eyes, ears, mouth and genitals (private parts). Wash Face and genitals (private parts)  with your normal soap.  6. Wash thoroughly, paying special attention to the area where your surgery will be performed.  7. Thoroughly rinse your body with  warm water from the neck down.  8. DO NOT shower/wash with your normal soap after using and rinsing off the CHG Soap.  9. Pat yourself dry with a CLEAN TOWEL.  10. Wear CLEAN PAJAMAS to bed the night before surgery, wear comfortable clothes the morning of surgery  11. Place CLEAN SHEETS on your bed the night of your first shower and DO NOT SLEEP WITH PETS.  Day  of Surgery: Do not apply any deodorants/lotions. Please wear clean clothes to the [name]/surgery center.    Please read over the following fact sheets that you were given. Pain Booklet, Coughing and Deep Breathing and Surgical Site Infection Prevention

## 2017-12-15 ENCOUNTER — Other Ambulatory Visit: Payer: Self-pay

## 2017-12-15 ENCOUNTER — Encounter (HOSPITAL_COMMUNITY): Payer: Self-pay

## 2017-12-15 ENCOUNTER — Ambulatory Visit (INDEPENDENT_AMBULATORY_CARE_PROVIDER_SITE_OTHER): Payer: 59 | Admitting: Internal Medicine

## 2017-12-15 ENCOUNTER — Encounter (HOSPITAL_COMMUNITY)
Admission: RE | Admit: 2017-12-15 | Discharge: 2017-12-15 | Disposition: A | Discharge status: Home / Self Care | Payer: PRIVATE HEALTH INSURANCE | Source: Ambulatory Visit | Attending: Orthopedic Surgery | Admitting: Orthopedic Surgery

## 2017-12-15 ENCOUNTER — Encounter (INDEPENDENT_AMBULATORY_CARE_PROVIDER_SITE_OTHER): Payer: Self-pay | Admitting: Internal Medicine

## 2017-12-15 VITALS — BP 150/100 | HR 80 | Ht 71.5 in | Wt 224.5 lb

## 2017-12-15 DIAGNOSIS — M19012 Primary osteoarthritis, left shoulder: Secondary | ICD-10-CM | POA: Insufficient documentation

## 2017-12-15 DIAGNOSIS — K5732 Diverticulitis of large intestine without perforation or abscess without bleeding: Secondary | ICD-10-CM

## 2017-12-15 DIAGNOSIS — Z01818 Encounter for other preprocedural examination: Secondary | ICD-10-CM | POA: Diagnosis present

## 2017-12-15 DIAGNOSIS — M7522 Bicipital tendinitis, left shoulder: Secondary | ICD-10-CM | POA: Insufficient documentation

## 2017-12-15 LAB — CBC
HCT: 38.8 % (ref 36.0–46.0)
Hemoglobin: 12.5 g/dL (ref 12.0–15.0)
MCH: 28.3 pg (ref 26.0–34.0)
MCHC: 32.2 g/dL (ref 30.0–36.0)
MCV: 87.8 fL (ref 78.0–100.0)
PLATELETS: 364 10*3/uL (ref 150–400)
RBC: 4.42 MIL/uL (ref 3.87–5.11)
RDW: 14.9 % (ref 11.5–15.5)
WBC: 4.6 10*3/uL (ref 4.0–10.5)

## 2017-12-15 LAB — BASIC METABOLIC PANEL
Anion gap: 8 (ref 5–15)
BUN: 16 mg/dL (ref 6–20)
CALCIUM: 8.8 mg/dL — AB (ref 8.9–10.3)
CHLORIDE: 106 mmol/L (ref 101–111)
CO2: 25 mmol/L (ref 22–32)
CREATININE: 0.81 mg/dL (ref 0.44–1.00)
GFR calc Af Amer: >60 mL/min (ref >60)
GFR calc non Af Amer: >60 mL/min (ref >60)
Glucose, Bld: 104 mg/dL — ABNORMAL HIGH (ref 65–99)
Potassium: 4.2 mmol/L (ref 3.5–5.1)
SODIUM: 139 mmol/L (ref 135–145)

## 2017-12-15 NOTE — Progress Notes (Signed)
   Subjective:    Patient ID: [name], female    DOB: 17-Dec-1965, 52 y.o.   MRN: [id number]  HPI Patient is here as a work in. She called Monday with c/o of a diverticular flare. I called in Cipro, Flagyl and Diflucan. In November of 2014 she underwent a CT abdomen/pelvis for LLQ pain: CT scan revealed : Findings are consistent with mild diverticulitis. No diverticular abscess is noted. She states she has abdominal distention.  She has been trying to diet. She has lost 40 pounds. She has fullness in her abdomen. She had chills yesterday. She says as far as she know she has not had a fever. She has not had a BM since Sunday. Her stools were skinny and pencil thin.   Hx of Crohn's disease but is not maintained on any Crohn's medication.  CBC    Component Value Date/Time   WBC 4.6 12/15/2017 0827   RBC 4.42 12/15/2017 0827   HGB 12.5 12/15/2017 0827   HCT 38.8 12/15/2017 0827   PLT 364 12/15/2017 0827   MCV 87.8 12/15/2017 0827   MCH 28.3 12/15/2017 0827   MCHC 32.2 12/15/2017 0827   RDW 14.9 12/15/2017 0827   LYMPHSABS 1.7 12/16/2016 0825   MONOABS 0.4 12/16/2016 0825   EOSABS 0.2 12/16/2016 0825   BASOSABS 0.0 12/16/2016 0825     Review of Systems     Objective:   Physical Exam        Assessment & Plan:

## 2017-12-15 NOTE — Patient Instructions (Signed)
If pain worsens, go to the ED.

## 2017-12-15 NOTE — Progress Notes (Signed)
Subjective:    Patient ID: [name], female    DOB: 1966/06/05, 52 y.o.   MRN: [id number]  HPI Patient is here as a work in. She called yesterday with c/o of a diverticular flare. I called in Cipro, Flagyl and Diflucan. She rates her at a 6/10. She says it just feels uncomfortable. Has not had a BM since Sunday.  She states today she has abdominal distention. She has She has fullness in her abdomen. She had chills yesterday but does not think she had a fever. Has not had a BM since Sunday. Her stool was small and thin Sunday.  Her last colonoscopy was in 2011 which revealed scattered diverticula thru out the colon. No evidence of endoscopic colitis.  In November of 2014 she underwent a CT abdomen/pelvis for LLQ pain: CT scan revealed : Findings are consistent with mild diverticulitis. No diverticular abscess is noted.  Hx of Crohn's disease but is not maintained on any Crohn's medication.  CBC    Component Value Date/Time   WBC 4.6 12/15/2017 0827   RBC 4.42 12/15/2017 0827   HGB 12.5 12/15/2017 0827   HCT 38.8 12/15/2017 0827   PLT 364 12/15/2017 0827   MCV 87.8 12/15/2017 0827   MCH 28.3 12/15/2017 0827   MCHC 32.2 12/15/2017 0827   RDW 14.9 12/15/2017 0827   LYMPHSABS 1.7 12/16/2016 0825   MONOABS 0.4 12/16/2016 0825   EOSABS 0.2 12/16/2016 0825   BASOSABS 0.0 12/16/2016 0825     Review of Systems Past Medical History:  Diagnosis Date  . Arthritis   . Asthma   . Chest pain    cath 2005 norm cors, repeat cath Feb 2011 normal cors and only minimally  elevated pulmonary pressures  . Crohn disease ([place])   . Diverticulosis   . GERD (gastroesophageal reflux disease)   . Hiatal hernia   . Hypertension   . Obesity   . Palpitations   . Sleep apnea sleep study 11/03/2009    cpap; does sometimes, doesnt use every night.    Past Surgical History:  Procedure Laterality Date  . ABDOMINAL HYSTERECTOMY    . ABDOMINAL HYSTERECTOMY    . ANKLE ARTHROSCOPY  06/01/2012   Procedure: ANKLE ARTHROSCOPY;  Surgeon: Colin Rhein, MD;  Location: [facility];  Service: Orthopedics;  Laterality: Left;  left ankle arthorsocpy with extensive debridement and gastroc slide  . CARDIAC CATHETERIZATION  11/22/2009 and 2005   WNL  . CESAREAN SECTION    . CHOLECYSTECTOMY  09/08/2006   lap. chole.  . CHONDROPLASTY  06/17/2012   Procedure: CHONDROPLASTY;  Surgeon: Carole Civil, MD;  Location: [facility];  Service: Orthopedics;  Laterality: Right;  right patella  . ESOPHAGOGASTRODUODENOSCOPY N/A 05/14/2015   Procedure: ESOPHAGOGASTRODUODENOSCOPY (EGD);  Surgeon: Rogene Houston, MD;  Location: [facility];  Service: Endoscopy;  Laterality: N/A;  730  . INGUINAL HERNIA REPAIR  10/30/2008   right  . KNEE ARTHROSCOPY WITH LATERAL MENISECTOMY Left 12/23/2016   Procedure: LEFT KNEE ARTHROSCOPY WITH LATERAL MENISECTOMY;  Surgeon: Carole Civil, MD;  Location: [facility];  Service: Orthopedics;  Laterality: Left;  . KNEE ARTHROSCOPY WITH MEDIAL MENISECTOMY Left 12/23/2016   Procedure: LEFT KNEE ARTHROSCOPY WITH MEDIAL MENISECTOMY CHONDROPLASTY PATELLA  AND MEDIAL FEMORAL CONDYLE LEFT KNEE;  Surgeon: Carole Civil, MD;  Location: [facility];  Service: Orthopedics;  Laterality: Left;  . SHOULDER SURGERY     right x 2     Allergies  Allergen  Reactions  . Iodinated Diagnostic Agents Anaphylaxis and Hives  . Other Shortness Of Breath    Lemon grass  . Wasp Venom Anaphylaxis and Shortness Of Breath  . Hydromorphone Hcl Nausea And Vomiting  . Omnipaque [Iohexol] Hives and Nausea Only    Pt. Was premedicated with emergent protocol.  . Pollen Extract Swelling  . Adhesive [Tape] Rash    Current Outpatient Medications on File Prior to Visit  Medication Sig Dispense Refill  . albuterol (PROVENTIL) (2.5 MG/3ML) 0.083% nebulizer solution Take 2.5 mg by nebulization every 6 (six) hours as needed. For shortness of breath    . albuterol (VENTOLIN HFA) 108 (90 Base) MCG/ACT  inhaler Inhale 1-2 puffs into the lungs every 6 (six) hours as needed for wheezing or shortness of breath.    Marland Kitchen amLODipine (NORVASC) 5 MG tablet Take 5 mg by mouth at bedtime.    . budesonide-formoterol (SYMBICORT) 160-4.5 MCG/ACT inhaler Inhale 2 puffs into the lungs 2 (two) times daily.    . cetirizine (ZYRTEC) 10 MG tablet Take 10 mg by mouth daily.    . ciprofloxacin (CIPRO) 500 MG tablet Take 1 tablet (500 mg total) by mouth 2 (two) times daily. 28 tablet 0  . cyclobenzaprine (FLEXERIL) 10 MG tablet Take 1 tablet (10 mg total) by mouth 3 (three) times daily as needed. For muscle spasms 40 tablet 2  . cyclobenzaprine (FLEXERIL) 10 MG tablet TAKE ONE TABLET BY MOUTH 3 TIMES DAILY AS NEEDED FOR MUSCLE SPASM(S). 40 tablet 0  . diclofenac sodium (VOLTAREN) 1 % GEL Apply 4 g topically 4 (four) times daily. 3 Tube 5  . EPINEPHrine (EPIPEN) 0.3 mg/0.3 mL SOAJ injection Inject 0.3 mLs (0.3 mg total) into the muscle once. 1 Device 0  . famotidine (PEPCID) 20 MG tablet Take 20 mg by mouth as needed for heartburn or indigestion.    . fluconazole (DIFLUCAN) 100 MG tablet Take 1 tablet (100 mg total) by mouth daily. 1 tablet 1  . fluticasone (FLONASE) 50 MCG/ACT nasal spray Place 1 spray into both nostrils daily.    Marland Kitchen gabapentin (NEURONTIN) 300 MG capsule Take 1 capsule (300 mg total) by mouth at bedtime. 90 capsule 5  . IBU 800 MG tablet TAKE (1) TABLET BY MOUTH (3) TIMES DAILY. (Patient taking differently: TAKE (1) TABLET BY as needed) 90 tablet 0  . losartan (COZAAR) 100 MG tablet Take 100 mg by mouth at bedtime.     . meclizine (ANTIVERT) 25 MG tablet Take 25 mg by mouth 3 (three) times daily as needed for dizziness.    . meloxicam (MOBIC) 7.5 MG tablet Take 7.5 mg by mouth 2 (two) times daily.    . metroNIDAZOLE (FLAGYL) 500 MG tablet Take 1 tablet (500 mg total) by mouth 2 (two) times daily. 28 tablet 0  . montelukast (SINGULAIR) 10 MG tablet Take 10 mg by mouth at bedtime.    . ondansetron (ZOFRAN)  4 MG tablet Take 4 mg by mouth every 8 (eight) hours as needed for nausea or vomiting.    . pantoprazole (PROTONIX) 40 MG tablet TAKE (1) TABLET BY MOUTH TWICE DAILY BEFORE A MEAL. (Patient taking differently: TAKE (1) TABLET BY MOUTH TWICE DAILY as needed) 60 tablet 1   No current facility-administered medications on file prior to visit.         Objective:   Physical Exam  Blood pressure (!) 150/100, pulse 80, height 5' 11.5" (1.816 m), weight 224 lb 8 oz (101.8 kg).  Alert and oriented.  Skin warm and dry. Oral mucosa is moist.   . Sclera anicteric, conjunctivae is pink. Thyroid not enlarged. No cervical lymphadenopathy. Lungs clear. Heart regular rate and rhythm.  Abdomen is soft. Bowel sounds are positive. No hepatomegaly. No abdominal masses felt. Tenderness mid abdomen. Really no tenderness LLQ.  Her abdomen is soft. It does not appear distended to me.   No edema to lower extremities.            Assessment & Plan:  Possible diverticular flare. She had a normal CBC yesterday. She will continue the Cipro and Flagyl. She will try a stool softener. She will call me tomorrow.  If pain worsens, she was advised to go to the ED. She will let her orthopedic surgeon know that she is possibly having a diverticular flare. She is scheduled for surgery 12/21/2017.

## 2017-12-16 ENCOUNTER — Encounter (HOSPITAL_COMMUNITY): Payer: Self-pay | Admitting: Occupational Therapy

## 2017-12-16 NOTE — Progress Notes (Signed)
Anesthesia Chart Review:  Pt is a 52 year old female scheduled for L shoulder arthroscopic biceps tenodesis, SAD, DCR and labrum debridement on 12/21/2017 with Victorino December, MD  - PCP is Sharilyn Sites, MD - Used to see cardiologist Glori Bickers, MD for chest pain; normal coronaries by cath 2011. Last office visit 07/16/11. Last cardiology evaluation in [place] 04/27/16 when pt was inpatient for chest pain that was determined to be atypical, noncardiac.   PMH includes:  Chronic chest pain (normal coronaries), HTN, palpitations, OSA, Crohn's disease, asthma, GERD. Never smoker. BMI 31. S/p knee arthroscopy 12/23/16 and 06/17/12. S/p ankle arthroscopy 06/01/12  Medications include: albuterol, amlodipine, symbicort, cipro, pepcid, fluconazole, losartan, flagyl, protonix.  Pt taking antibiotics right now for possible diverticular flare per Deberah Castle, NP note 12/15/17  BP (!) 131/93   Pulse 74   Temp 36.8 C   Resp 20   Ht 5' 11"  (1.803 m)   Wt 224 lb 9.6 oz (101.9 kg)   SpO2 100%   BMI 31.33 kg/m    Preoperative labs reviewed.    CXR 11/10/17: Normal exam.  EKG 12/15/17: NSR  Echo 04/27/16:  - Left ventricle: The cavity size was normal. Wall thickness was increased in a pattern of mild LVH. Systolic function was normal. The estimated ejection fraction was in the range of 55% to 60%. Wall motion was normal; there were no regional wall motion abnormalities. Left ventricular diastolic function parameters were normal. - Atrial septum: No defect or patent foramen ovale was identified.  Cardiac cath 11/22/09: 1.  Normal coronary arteries. 2.  Normal LV function. 3.  Minimally elevated pulmonary pressures secondary to increased cardiac output with normal pulmonary vascular resistance.  4.  No evidence of intracardiac shunting  If no changes, I anticipate pt can proceed with surgery as scheduled.   Willeen Cass, FNP-BC [facility]/Anesthesiology Phone:  [phone] 12/16/2017 11:27 AM

## 2017-12-21 ENCOUNTER — Ambulatory Visit (HOSPITAL_COMMUNITY): Payer: PRIVATE HEALTH INSURANCE | Admitting: Anesthesiology

## 2017-12-21 ENCOUNTER — Encounter (HOSPITAL_COMMUNITY)
Admission: RE | Disposition: A | Discharge status: Home / Self Care | Payer: Self-pay | Source: Ambulatory Visit | Attending: Orthopedic Surgery

## 2017-12-21 ENCOUNTER — Ambulatory Visit (HOSPITAL_COMMUNITY)
Admission: RE | Admit: 2017-12-21 | Discharge: 2017-12-21 | Disposition: A | Discharge status: Home / Self Care | Payer: PRIVATE HEALTH INSURANCE | Source: Ambulatory Visit | Attending: Orthopedic Surgery | Admitting: Orthopedic Surgery

## 2017-12-21 ENCOUNTER — Other Ambulatory Visit: Payer: Self-pay

## 2017-12-21 ENCOUNTER — Ambulatory Visit (HOSPITAL_COMMUNITY): Payer: PRIVATE HEALTH INSURANCE | Admitting: Emergency Medicine

## 2017-12-21 ENCOUNTER — Encounter (HOSPITAL_COMMUNITY): Payer: Self-pay

## 2017-12-21 DIAGNOSIS — S43431A Superior glenoid labrum lesion of right shoulder, initial encounter: Secondary | ICD-10-CM | POA: Diagnosis not present

## 2017-12-21 DIAGNOSIS — M25812 Other specified joint disorders, left shoulder: Secondary | ICD-10-CM | POA: Insufficient documentation

## 2017-12-21 DIAGNOSIS — Z7951 Long term (current) use of inhaled steroids: Secondary | ICD-10-CM | POA: Insufficient documentation

## 2017-12-21 DIAGNOSIS — Z79899 Other long term (current) drug therapy: Secondary | ICD-10-CM | POA: Insufficient documentation

## 2017-12-21 DIAGNOSIS — M1990 Unspecified osteoarthritis, unspecified site: Secondary | ICD-10-CM | POA: Diagnosis not present

## 2017-12-21 DIAGNOSIS — M7542 Impingement syndrome of left shoulder: Secondary | ICD-10-CM | POA: Diagnosis not present

## 2017-12-21 DIAGNOSIS — J45909 Unspecified asthma, uncomplicated: Secondary | ICD-10-CM | POA: Insufficient documentation

## 2017-12-21 DIAGNOSIS — G4730 Sleep apnea, unspecified: Secondary | ICD-10-CM | POA: Insufficient documentation

## 2017-12-21 DIAGNOSIS — X58XXXA Exposure to other specified factors, initial encounter: Secondary | ICD-10-CM | POA: Diagnosis not present

## 2017-12-21 DIAGNOSIS — I10 Essential (primary) hypertension: Secondary | ICD-10-CM | POA: Diagnosis not present

## 2017-12-21 DIAGNOSIS — M19012 Primary osteoarthritis, left shoulder: Secondary | ICD-10-CM | POA: Insufficient documentation

## 2017-12-21 HISTORY — PX: SHOULDER ARTHROSCOPY WITH SUBACROMIAL DECOMPRESSION AND BICEP TENDON REPAIR: SHX5689

## 2017-12-21 SURGERY — SHOULDER ARTHROSCOPY WITH SUBACROMIAL DECOMPRESSION AND BICEP TENDON REPAIR
Anesthesia: General | Site: Shoulder | Laterality: Left

## 2017-12-21 MED ORDER — FAMOTIDINE IN NACL 20-0.9 MG/50ML-% IV SOLN
20.0000 mg | Freq: Once | INTRAVENOUS | Status: AC
  Administered 2017-12-21: 20 mg via INTRAVENOUS

## 2017-12-21 MED ORDER — CHLORHEXIDINE GLUCONATE 4 % EX LIQD: 60.0000 mL | Freq: Once | CUTANEOUS | Status: DC

## 2017-12-21 MED ORDER — OXYCODONE HCL 5 MG PO TABS: 5.0000 mg | ORAL_TABLET | ORAL | 0 refills | Status: DC | PRN

## 2017-12-21 MED ORDER — PHENYLEPHRINE HCL 10 MG/ML IJ SOLN
INTRAMUSCULAR | Status: DC | PRN
  Administered 2017-12-21 (×2): 200 ug via INTRAVENOUS

## 2017-12-21 MED ORDER — EPHEDRINE SULFATE 50 MG/ML IJ SOLN
INTRAMUSCULAR | Status: DC | PRN
  Administered 2017-12-21: 10 mg via INTRAVENOUS

## 2017-12-21 MED ORDER — PROPOFOL 10 MG/ML IV BOLUS
INTRAVENOUS | Status: AC
  Filled 2017-12-21: qty 20

## 2017-12-21 MED ORDER — MIDAZOLAM HCL 2 MG/2ML IJ SOLN
INTRAMUSCULAR | Status: AC
  Filled 2017-12-21: qty 2

## 2017-12-21 MED ORDER — OXYCODONE HCL 5 MG PO TABS: 5.0000 mg | ORAL_TABLET | Freq: Once | ORAL | Status: DC | PRN

## 2017-12-21 MED ORDER — CEFAZOLIN SODIUM-DEXTROSE 2-4 GM/100ML-% IV SOLN
INTRAVENOUS | Status: AC
  Filled 2017-12-21: qty 100

## 2017-12-21 MED ORDER — SODIUM CHLORIDE 0.9 % IR SOLN
Status: DC | PRN
  Administered 2017-12-21: 3000 mL
  Administered 2017-12-21: 1000 mL

## 2017-12-21 MED ORDER — BUPIVACAINE-EPINEPHRINE (PF) 0.25% -1:200000 IJ SOLN
INTRAMUSCULAR | Status: AC
  Filled 2017-12-21: qty 30

## 2017-12-21 MED ORDER — PHENYLEPHRINE HCL 10 MG/ML IJ SOLN
INTRAVENOUS | Status: DC | PRN
  Administered 2017-12-21: 50 ug/min via INTRAVENOUS

## 2017-12-21 MED ORDER — ACETAMINOPHEN 160 MG/5ML PO SOLN: 325.0000 mg | ORAL | Status: DC | PRN

## 2017-12-21 MED ORDER — FENTANYL CITRATE (PF) 100 MCG/2ML IJ SOLN
100.0000 ug | Freq: Once | INTRAMUSCULAR | Status: AC
  Administered 2017-12-21: 100 ug via INTRAVENOUS

## 2017-12-21 MED ORDER — PROPOFOL 10 MG/ML IV BOLUS
INTRAVENOUS | Status: DC | PRN
  Administered 2017-12-21: 200 mg via INTRAVENOUS

## 2017-12-21 MED ORDER — LACTATED RINGERS IV SOLN
INTRAVENOUS | Status: DC
  Administered 2017-12-21 (×2): via INTRAVENOUS

## 2017-12-21 MED ORDER — CEFAZOLIN SODIUM-DEXTROSE 2-4 GM/100ML-% IV SOLN
2.0000 g | INTRAVENOUS | Status: AC
  Administered 2017-12-21: 2 g via INTRAVENOUS

## 2017-12-21 MED ORDER — ONDANSETRON 4 MG PO TBDP: 4.0000 mg | ORAL_TABLET | Freq: Three times a day (TID) | ORAL | 0 refills | Status: DC | PRN

## 2017-12-21 MED ORDER — LIDOCAINE HCL (CARDIAC) 20 MG/ML IV SOLN
INTRAVENOUS | Status: AC
  Filled 2017-12-21: qty 5

## 2017-12-21 MED ORDER — KETOROLAC TROMETHAMINE 30 MG/ML IJ SOLN: 30.0000 mg | Freq: Once | INTRAMUSCULAR | Status: DC | PRN

## 2017-12-21 MED ORDER — FAMOTIDINE IN NACL 20-0.9 MG/50ML-% IV SOLN
INTRAVENOUS | Status: AC
  Administered 2017-12-21: 20 mg via INTRAVENOUS
  Filled 2017-12-21: qty 50

## 2017-12-21 MED ORDER — DEXAMETHASONE SODIUM PHOSPHATE 10 MG/ML IJ SOLN
INTRAMUSCULAR | Status: AC
  Filled 2017-12-21: qty 1

## 2017-12-21 MED ORDER — BUPIVACAINE HCL (PF) 0.5 % IJ SOLN
INTRAMUSCULAR | Status: DC | PRN
  Administered 2017-12-21: 15 mL via PERINEURAL

## 2017-12-21 MED ORDER — ONDANSETRON HCL 4 MG/2ML IJ SOLN: 4.0000 mg | Freq: Once | INTRAMUSCULAR | Status: DC | PRN

## 2017-12-21 MED ORDER — SUCCINYLCHOLINE CHLORIDE 20 MG/ML IJ SOLN
INTRAMUSCULAR | Status: DC | PRN
  Administered 2017-12-21: 160 mg via INTRAVENOUS

## 2017-12-21 MED ORDER — MEPERIDINE HCL 50 MG/ML IJ SOLN: 6.2500 mg | INTRAMUSCULAR | Status: DC | PRN

## 2017-12-21 MED ORDER — LIDOCAINE HCL (CARDIAC) 20 MG/ML IV SOLN
INTRAVENOUS | Status: DC | PRN
  Administered 2017-12-21: 100 mg via INTRAVENOUS

## 2017-12-21 MED ORDER — DIPHENHYDRAMINE HCL 50 MG/ML IJ SOLN
50.0000 mg | Freq: Once | INTRAMUSCULAR | Status: AC
  Administered 2017-12-21: 50 mg via INTRAVENOUS

## 2017-12-21 MED ORDER — ACETAMINOPHEN 325 MG PO TABS: 325.0000 mg | ORAL_TABLET | ORAL | Status: DC | PRN

## 2017-12-21 MED ORDER — DIPHENHYDRAMINE HCL 50 MG/ML IJ SOLN
INTRAMUSCULAR | Status: AC
  Administered 2017-12-21: 50 mg via INTRAVENOUS
  Filled 2017-12-21: qty 1

## 2017-12-21 MED ORDER — DEXAMETHASONE SODIUM PHOSPHATE 10 MG/ML IJ SOLN
INTRAMUSCULAR | Status: DC | PRN
  Administered 2017-12-21: 10 mg via INTRAVENOUS

## 2017-12-21 MED ORDER — BUPIVACAINE LIPOSOME 1.3 % IJ SUSP
INTRAMUSCULAR | Status: DC | PRN
  Administered 2017-12-21: 10 mL via PERINEURAL

## 2017-12-21 MED ORDER — FENTANYL CITRATE (PF) 100 MCG/2ML IJ SOLN
INTRAMUSCULAR | Status: AC
  Administered 2017-12-21: 100 ug via INTRAVENOUS
  Filled 2017-12-21: qty 2

## 2017-12-21 MED ORDER — FENTANYL CITRATE (PF) 100 MCG/2ML IJ SOLN: 25.0000 ug | INTRAMUSCULAR | Status: DC | PRN

## 2017-12-21 MED ORDER — SUCCINYLCHOLINE CHLORIDE 200 MG/10ML IV SOSY
PREFILLED_SYRINGE | INTRAVENOUS | Status: AC
  Filled 2017-12-21: qty 10

## 2017-12-21 MED ORDER — ONDANSETRON HCL 4 MG/2ML IJ SOLN
INTRAMUSCULAR | Status: DC | PRN
  Administered 2017-12-21: 4 mg via INTRAVENOUS

## 2017-12-21 MED ORDER — OXYCODONE HCL 5 MG/5ML PO SOLN: 5.0000 mg | Freq: Once | ORAL | Status: DC | PRN

## 2017-12-21 SURGICAL SUPPLY — 52 items
ANCH SUT SWLK 15.8X3.5 (Anchor) ×1 IMPLANT
ANCHOR SUTBIO SWIVELK 3.5X15.8 (Anchor) ×1 IMPLANT
BLADE GREAT WHITE 4.2 (BLADE) ×2 IMPLANT
BLADE SURG 11 STRL SS (BLADE) ×2 IMPLANT
BUR OVAL 4.0 (BURR) ×2 IMPLANT
BURR OVAL 8 FLU 4.0X13 (MISCELLANEOUS) ×1 IMPLANT
CANNULA 5.75X7 CRYSTAL CLEAR (CANNULA) ×2 IMPLANT
CANNULA TWIST IN 8.25X7CM (CANNULA) ×3 IMPLANT
CUTTER BONE 4.0MM X 13CM (MISCELLANEOUS) ×1 IMPLANT
DRAPE INCISE IOBAN 66X45 STRL (DRAPES) ×2 IMPLANT
DRAPE STERI 35X30 U-POUCH (DRAPES) ×2 IMPLANT
DRAPE U-SHAPE 47X51 STRL (DRAPES) ×2 IMPLANT
DRSG PAD ABDOMINAL 8X10 ST (GAUZE/BANDAGES/DRESSINGS) ×3 IMPLANT
DURAPREP 26ML APPLICATOR (WOUND CARE) ×4 IMPLANT
GAUZE SPONGE 4X4 12PLY STRL (GAUZE/BANDAGES/DRESSINGS) ×2 IMPLANT
GAUZE XEROFORM 1X8 LF (GAUZE/BANDAGES/DRESSINGS) ×1 IMPLANT
GLOVE BIO SURGEON STRL SZ7.5 (GLOVE) ×2 IMPLANT
GLOVE BIOGEL PI IND STRL 8 (GLOVE) ×1 IMPLANT
GLOVE BIOGEL PI INDICATOR 8 (GLOVE) ×1
GOWN STRL REUS W/ TWL LRG LVL3 (GOWN DISPOSABLE) ×2 IMPLANT
GOWN STRL REUS W/TWL LRG LVL3 (GOWN DISPOSABLE) ×4
KIT BASIN OR (CUSTOM PROCEDURE TRAY) ×2 IMPLANT
KIT ROOM TURNOVER OR (KITS) ×2 IMPLANT
KIT SHOULDER TRACTION (DRAPES) ×2 IMPLANT
MANIFOLD NEPTUNE II (INSTRUMENTS) ×2 IMPLANT
NDL SCORPION MULTI FIRE (NEEDLE) ×1 IMPLANT
NDL SPNL 18GX3.5 QUINCKE PK (NEEDLE) ×1 IMPLANT
NEEDLE SCORPION MULTI FIRE (NEEDLE) ×2 IMPLANT
NEEDLE SPNL 18GX3.5 QUINCKE PK (NEEDLE) ×2 IMPLANT
NS IRRIG 1000ML POUR BTL (IV SOLUTION) ×2 IMPLANT
PACK SHOULDER (CUSTOM PROCEDURE TRAY) ×2 IMPLANT
PAD ABD 8X10 STRL (GAUZE/BANDAGES/DRESSINGS) ×3 IMPLANT
PAD ARMBOARD 7.5X6 YLW CONV (MISCELLANEOUS) ×4 IMPLANT
PASSER SUT SWIFTSTITCH HIP CRT (INSTRUMENTS) ×1 IMPLANT
PROBE BIPOLAR ATHRO 135MM 90D (MISCELLANEOUS) ×2 IMPLANT
SET ARTHROSCOPY TUBING (MISCELLANEOUS) ×2
SET ARTHROSCOPY TUBING LN (MISCELLANEOUS) ×1 IMPLANT
SLEEVE ARM SUSPENSION SYSTEM (MISCELLANEOUS) ×1 IMPLANT
SLING ARM IMMOBILIZER LRG (SOFTGOODS) ×1 IMPLANT
SLING S3 LATERAL DISP (MISCELLANEOUS) ×2 IMPLANT
SPONGE LAP 4X18 X RAY DECT (DISPOSABLE) ×2 IMPLANT
SUT ETHILON 3 0 PS 1 (SUTURE) ×3 IMPLANT
SUT TIGER TAPE 7 IN WHITE (SUTURE) IMPLANT
SUTURE TAPE TIGERLINK 1.3MM BL (SUTURE) IMPLANT
SUTURETAPE TIGERLINK 1.3MM BL (SUTURE) ×2
TAPE FIBER 2MM 7IN #2 BLUE (SUTURE) ×2 IMPLANT
TAPE PAPER 3X10 WHT MICROPORE (GAUZE/BANDAGES/DRESSINGS) ×2 IMPLANT
TOWEL OR 17X24 6PK STRL BLUE (TOWEL DISPOSABLE) ×2 IMPLANT
TOWEL OR 17X26 10 PK STRL BLUE (TOWEL DISPOSABLE) ×2 IMPLANT
TUBE CONNECTING 12X1/4 (SUCTIONS) ×1 IMPLANT
WAND HAND CNTRL MULTIVAC 90 (MISCELLANEOUS) ×2 IMPLANT
WATER STERILE IRR 1000ML POUR (IV SOLUTION) ×2 IMPLANT

## 2017-12-21 NOTE — Op Note (Signed)
Date of Surgery: 12/21/2017  INDICATIONS: [name] is a 52 y.o.-year-old female with a left shoulder labrum tearing, subacromial impingement, biceps tendinopathy, and AC joint arthritis.  This is all related to a workers comp injury sustained a few months back.  She has failed conservative measures and presents today for arthroscopic left shoulder intervention.;  The patient did consent to the procedure after discussion of the risks and benefits.  PREOPERATIVE DIAGNOSIS:  1.  Left shoulder AC joint arthritis 2.  Left shoulder subacromial impingement 3.  Left shoulder anterior and superior labral tearing 4.  Left shoulder biceps tendinopathy  POSTOPERATIVE DIAGNOSIS: Same.  PROCEDURE:  1.  Left shoulder arthroscopic extensive debridement including rotator interval, superior labrum, anterior labrum, subacromial bursa. 2. Left shoulder arthroscopic biceps tenodesis 3.  Left shoulder subacromial decompression 4.  Left shoulder distal clavicle resection (1.0 cm in total) ([place])   SURGEON: [name], M.D.  ASSIST: none.  ANESTHESIA:  general, interscalene  IV FLUIDS AND URINE: See anesthesia.  ESTIMATED BLOOD LOSS: 30 mL.  IMPLANTS: 3.5 mm Arthrex Swivel lock  DRAINS: none  COMPLICATIONS: None.  DESCRIPTION OF PROCEDURE: The patient was brought to the operating room and placed supine on the operating table.  The patient had been signed prior to the procedure and this was documented. The patient had the anesthesia placed by the anesthesiologist.  A time-out was performed to confirm that this was the correct patient, site, side and location. The patient did receive antibiotics prior to the incision and was re-dosed during the procedure as needed at indicated intervals.     After obtaining informed consent the patient was brought to the operating table and underwent satisfactory anesthesia. An exam under anesthesia revealed full range of motion. She was placed in the lateral  decubitus position with an axillary roll and all bony prominences properly padded. A standard surgical timeout was performed. She was placed in 10 pounds of gentle in-line suspension.  Standard posterior and anterior superior portals were established. A diagnostic evaluation of the glenohumeral joint was performed.  All 4 rotator cuff tendons were intact without partial tearing.  There was grade 0 chondromalacia of the humeral head as well as glenoid fossa.  There was minimal superior and posterior labral tear with more pronounced anterior tearing above the 9 o'clock position of his left shoulder.  There was some noted injected capsule however no loose bodies.    The biceps tendon was markedly synovitic and had longitudinal tearing.  We next prepared for the biceps tenodesis.  2 half racking self locking stitches were placed through the biceps tendon utilizing a fiber link suture.  Next the biceps was tenotomized at the level of the superior labrum.  We then prepared a small area just off the superior border of the subscapularis tendon for anchor placement.  The 3.5 mm Arthrex suture anchor was loaded with the details of the secured biceps tendon.  After the awl was placed to the appropriate depth the ankle was secured into the humeral head.  This had excellent purchase.   We next turned our attention to the debridement of the joint.  Utilizing the motorized shaver we did an extensive rotator interval debridement including the middle glenohumeral ligament to assist with postoperative range of motion.  Likewise, we debrided the anterior superior labrum.  In the subacromial space we did an entire subacromial subdeltoid bursectomy.  The arthroscope was inserted in the subacromial space and an additional lateral portal was established. An acromioplasty performed nicely  decompressing the subacromial space with a motorized burr. Bursitis in the subacromial space was removed as well as releases were performed on  the bursal surface of the rotator cuff.  Of note on the bursal side of the rotator cuff demonstrated no tearing.   We next moved to complete the distal clavicle resection.  Utilizing the radiofrequency ablation device we did a periosteal dissection to the [facility] joint.  The motorized bur was then used to resect approximately 2-3 mm of medial portion of the acromion.  We then resected approximately 8 mm of distal clavicle with the bur.  This led to a total of 1.0 cm of total resection.  Photographs were taken throughout the procedure.  All counts were correct x2.  There were no immediate complications.   The arthroscope was then removed and portals closed with 4-0 Monocryl in standard fashion followed by a sterile occlusive dressing Polar Care ice sleeve and a slingshot sling. The patient was sent to recovery in stable condition and tolerated the procedure well  POSTOPERATIVE PLAN:  Patient will be in her sling for approximately 6 weeks.  She will be nonweightbearing to the left upper extremity.  She will be discharged home from PACU with follow-up in 2 weeks for wound check.  She will begin physical therapy this week.

## 2017-12-21 NOTE — Anesthesia Preprocedure Evaluation (Signed)
Anesthesia Evaluation  Patient identified by MRN, date of birth, ID band Patient awake    Reviewed: Allergy & Precautions, H&P , NPO status , Patient's Chart, lab work & pertinent test results, reviewed documented beta blocker date and time   Airway Mallampati: II  TM Distance: >3 FB Neck ROM: full    Dental  (+) Teeth Intact   Pulmonary shortness of breath and with exertion, asthma , sleep apnea ,    breath sounds clear to auscultation       Cardiovascular hypertension, On Medications and Pt. on medications  Rhythm:regular  EKG 12/15/17: NSR  Echo 04/27/16:  - Left ventricle: The cavity size was normal. Wall thickness was increased in a pattern of mild LVH. Systolic function was normal. The estimated ejection fraction was in the range of 55% to 60%.Wall motion was normal; there were no regional wall motionabnormalities. Left ventricular diastolic function parameterswere normal. - Atrial septum: No defect or patent foramen ovale was identified.  Cardiac cath 11/22/09: 1.  Normal coronary arteries. 2.  Normal LV function. 3.  Minimally elevated pulmonary pressures secondary to increased cardiac output with normal pulmonary vascular resistance.  4.  No evidence of intracardiac shunting      Neuro/Psych  Neuromuscular disease negative psych ROS   GI/Hepatic Neg liver ROS, hiatal hernia, neg GERD (no longer active)  ,  Endo/Other  negative endocrine ROS  Renal/GU negative Renal ROS  negative genitourinary   Musculoskeletal  (+) Arthritis , Osteoarthritis,    Abdominal   Peds  Hematology negative hematology ROS (+)   Anesthesia Other Findings H/O anaphylactic reaction   Reproductive/Obstetrics negative OB ROS                             Anesthesia Physical  Anesthesia Plan  ASA: III  Anesthesia Plan: General   Post-op Pain Management: GA combined w/ Regional for post-op pain    Induction: Intravenous  PONV Risk Score and Plan: 2 and Diphenhydramine, Ondansetron, Treatment may vary due to age or medical condition and Dexamethasone  Airway Management Planned: Oral ETT  Additional Equipment:   Intra-op Plan:   Post-operative Plan: Extubation in OR  Informed Consent: I have reviewed the patients History and Physical, chart, labs and discussed the procedure including the risks, benefits and alternatives for the proposed anesthesia with the patient or authorized representative who has indicated his/her understanding and acceptance.   Dental advisory given  Plan Discussed with: CRNA, Anesthesiologist and Surgeon  Anesthesia Plan Comments: (Discussed both nerve block for pain relief post-op and GA; including NV, sore throat, dental injury, and pulmonary complications)        Anesthesia Quick Evaluation

## 2017-12-21 NOTE — Anesthesia Procedure Notes (Signed)

## 2017-12-21 NOTE — Progress Notes (Addendum)
After the nerve block procedure, pt c/o itching to face and voice changing. Dr. Ambrose Pancoast made aware, medications given as ordered. AAOX4, no difficulty breathing or swelling observed.

## 2017-12-21 NOTE — Brief Op Note (Signed)
12/21/2017  2:41 PM  PATIENT:  [name]  52 y.o. female  PRE-OPERATIVE DIAGNOSIS:  Left shoulder labrum tear, biceps tear, impingement  POST-OPERATIVE DIAGNOSIS:  Left shoulder labrum tear, biceps tear, impingement  PROCEDURE:  Procedure(s) with comments: Left shoulder arthroscopic biceps tenodesis, SAD, DCR and labrum debridement (Left) - 120 mins  SURGEON:  Surgeon(s) and Role:    * Stann Mainland, Elly Modena, MD - Primary  PHYSICIAN ASSISTANT:   ASSISTANTS: none   ANESTHESIA:   regional and general  EBL:  50 mL   BLOOD ADMINISTERED:none  DRAINS: none   LOCAL MEDICATIONS USED:  NONE  SPECIMEN:  No Specimen  DISPOSITION OF SPECIMEN:  N/A  COUNTS:  YES  TOURNIQUET:  * No tourniquets in log *  DICTATION: .Note written in EPIC  PLAN OF CARE: Discharge to home after PACU  PATIENT DISPOSITION:  PACU - hemodynamically stable.   Delay start of Pharmacological VTE agent (>24hrs) due to surgical blood loss or risk of bleeding: not applicable

## 2017-12-21 NOTE — Anesthesia Postprocedure Evaluation (Signed)
Anesthesia Post Note  Patient: [name]  Procedure(s) Performed: Left shoulder arthroscopic biceps tenodesis, SAD, DCR and labrum debridement (Left Shoulder)     Patient location during evaluation: PACU Anesthesia Type: General Level of consciousness: awake and alert Pain management: pain level controlled Vital Signs Assessment: post-procedure vital signs reviewed and stable Respiratory status: spontaneous breathing, nonlabored ventilation, respiratory function stable and patient connected to nasal cannula oxygen Cardiovascular status: blood pressure returned to baseline and stable Postop Assessment: no apparent nausea or vomiting Anesthetic complications: no    Last Vitals:  Vitals:   12/21/17 1250 12/21/17 1444  BP: (!) 153/98 (!) 160/98  Pulse: 69 (!) 103  Resp: 14 (!) 8  Temp:  (!) 36.3 C  SpO2: 100% 96%    Last Pain:  Vitals:   12/21/17 1444  TempSrc:   PainSc: 0-No pain                 Prestyn Mahn

## 2017-12-21 NOTE — Transfer of Care (Signed)
Immediate Anesthesia Transfer of Care Note  Patient: [name]  Procedure(s) Performed: Left shoulder arthroscopic biceps tenodesis, SAD, DCR and labrum debridement (Left Shoulder)  Patient Location: PACU  Anesthesia Type:GA combined with regional for post-op pain  Level of Consciousness: awake and patient cooperative  Airway & Oxygen Therapy: Patient Spontanous Breathing  Post-op Assessment: Report given to RN and Post -op Vital signs reviewed and stable  Post vital signs: Reviewed and stable  Last Vitals:  Vitals:   12/21/17 1250 12/21/17 1444  BP: (!) 153/98 (!) 160/98  Pulse: 69 (!) 103  Resp: 14 (!) 8  Temp:  (!) (P) 36.3 C  SpO2: 100% 96%    Last Pain:  Vitals:   12/21/17 1444  TempSrc:   PainSc: (P) 0-No pain      Patients Stated Pain Goal: 3 (39/68/86 [id number])  Complications: No apparent anesthesia complications

## 2017-12-21 NOTE — Anesthesia Procedure Notes (Signed)
Anesthesia Regional Block: Interscalene brachial plexus block   Pre-Anesthetic Checklist: ,, timeout performed, Correct Patient, Correct Site, Correct Laterality, Correct Procedure, Correct Position, site marked, Risks and benefits discussed,  Surgical consent,  Pre-op evaluation,  At surgeon's request and post-op pain management  Laterality: Left  Prep: chloraprep       Needles:  Injection technique: Single-shot  Needle Type: Echogenic Stimulator Needle     Needle Length: 5cm  Needle Gauge: 22     Additional Needles:   Procedures:, nerve stimulator,,, ultrasound used (permanent image in chart),,,,   Nerve Stimulator or Paresthesia:  Response: deltoid, 0.45 mA,   Additional Responses:   Narrative:  Start time: 12/21/2017 12:17 PM End time: 12/21/2017 12:22 PM Injection made incrementally with aspirations every 5 mL.  Performed by: Personally  Anesthesiologist: Janeece Riggers, MD  Additional Notes: Functioning IV was confirmed and monitors were applied.  A 74m 22ga Arrow echogenic stimulator needle was used. Sterile prep and drape,hand hygiene and sterile gloves were used. Ultrasound guidance: relevant anatomy identified, needle position confirmed, local anesthetic spread visualized around nerve(s)., vascular puncture avoided.  Image printed for medical record. Negative aspiration and negative test dose prior to incremental administration of local anesthetic. The patient tolerated the procedure well.

## 2017-12-21 NOTE — Discharge Instructions (Signed)
Orthopedic discharge instructions:  Maintain arm in sling at all times.  No lifting with the left arm. Apply ice to the left shoulder for 30 minutes at a time 3-4 times per day. He should begin physical therapy within 1-2 weeks from surgery. Maintain your postoperative bandage for 3 days.  You may remove the bandages on the fourth day and begin showering at that time.  After shower you should cover your incisions with Band-Aids. 4 mild to moderate pain use Tylenol and/or Motrin as needed.  For breakthrough pain use oxycodone as directed. -It is okay to remove your sling once or twice during the day to move the elbow, hand, and wrist.  Do not fully extend the elbow.  Your physical therapy sessions will progress you through postoperative range of motion and strengthening. -He will return to see Dr. Stann Mainland in 2 weeks for wound check.

## 2017-12-21 NOTE — H&P (Signed)
ORTHOPAEDIC H and P  REQUESTING PHYSICIAN: Nicholes Stairs, MD  PCP:  Sharilyn Sites, MD  Chief Complaint: Left shoulder pain  HPI: [name] is a 52 y.o. female who complains of Recalcitrant left shoulder pain following a work-related injury last year.  She persist today for operative intervention including biceps tenodesis and subacromial decompression with distal clavicle resection.  She has no new complaints at this time.  Past Medical History:  Diagnosis Date  . Arthritis   . Asthma   . Chest pain    cath 2005 norm cors, repeat cath Feb 2011 normal cors and only minimally  elevated pulmonary pressures  . Crohn disease ([place])   . Diverticulosis   . GERD (gastroesophageal reflux disease)   . Hiatal hernia   . Hypertension   . Obesity   . Palpitations   . Sleep apnea sleep study 11/03/2009    cpap; does sometimes, doesnt use every night.   Past Surgical History:  Procedure Laterality Date  . ABDOMINAL HYSTERECTOMY    . ABDOMINAL HYSTERECTOMY    . ANKLE ARTHROSCOPY  06/01/2012   Procedure: ANKLE ARTHROSCOPY;  Surgeon: Colin Rhein, MD;  Location: [facility];  Service: Orthopedics;  Laterality: Left;  left ankle arthorsocpy with extensive debridement and gastroc slide  . CARDIAC CATHETERIZATION  11/22/2009 and 2005   WNL  . CESAREAN SECTION    . CHOLECYSTECTOMY  09/08/2006   lap. chole.  . CHONDROPLASTY  06/17/2012   Procedure: CHONDROPLASTY;  Surgeon: Carole Civil, MD;  Location: [facility];  Service: Orthopedics;  Laterality: Right;  right patella  . ESOPHAGOGASTRODUODENOSCOPY N/A 05/14/2015   Procedure: ESOPHAGOGASTRODUODENOSCOPY (EGD);  Surgeon: Rogene Houston, MD;  Location: [facility];  Service: Endoscopy;  Laterality: N/A;  730  . INGUINAL HERNIA REPAIR  10/30/2008   right  . KNEE ARTHROSCOPY WITH LATERAL MENISECTOMY Left 12/23/2016   Procedure: LEFT KNEE ARTHROSCOPY WITH LATERAL MENISECTOMY;  Surgeon: Carole Civil, MD;  Location:  [facility];  Service: Orthopedics;  Laterality: Left;  . KNEE ARTHROSCOPY WITH MEDIAL MENISECTOMY Left 12/23/2016   Procedure: LEFT KNEE ARTHROSCOPY WITH MEDIAL MENISECTOMY CHONDROPLASTY PATELLA  AND MEDIAL FEMORAL CONDYLE LEFT KNEE;  Surgeon: Carole Civil, MD;  Location: [facility];  Service: Orthopedics;  Laterality: Left;  . SHOULDER SURGERY     right x 2    Social History   Socioeconomic History  . Marital status: Married    Spouse name: None  . Number of children: 4  . Years of education: None  . Highest education level: None  Social Needs  . Financial resource strain: None  . Food insecurity - worry: None  . Food insecurity - inability: None  . Transportation needs - medical: None  . Transportation needs - non-medical: None  Occupational History  . Occupation: [employer]  Tobacco Use  . Smoking status: Never Smoker  . Smokeless tobacco: Never Used  Substance and Sexual Activity  . Alcohol use: No    Alcohol/week: 0.0 oz  . Drug use: No  . Sexual activity: Yes    Birth control/protection: Surgical  Other Topics Concern  . None  Social History Narrative   Lives with husband and children   Caffeine use: 102 cups coffee/day   She is a respiratory therapist at [employer].     She has 4 children age 12-22.     Family History  Problem Relation Age of Onset  . Hypertension Mother   . Lupus  Father   . COPD Father   . Congestive Heart Failure Maternal Grandmother    Allergies  Allergen Reactions  . Iodinated Diagnostic Agents Anaphylaxis and Hives  . Other Shortness Of Breath    Lemon grass  . Wasp Venom Anaphylaxis and Shortness Of Breath  . Pollen Extract Swelling  . Adhesive [Tape] Rash  . Hydromorphone Hcl Nausea And Vomiting  . Omnipaque [Iohexol] Hives and Nausea Only    Pt. Was premedicated with emergent protocol.   Prior to Admission medications   Medication Sig Start Date End Date Taking? Authorizing Provider  albuterol (PROVENTIL) (2.5 MG/3ML) 0.083%  nebulizer solution Take 2.5 mg by nebulization every 6 (six) hours as needed. For shortness of breath   Yes [provider]  albuterol (VENTOLIN HFA) 108 (90 Base) MCG/ACT inhaler Inhale 1-2 puffs into the lungs every 6 (six) hours as needed for wheezing or shortness of breath.   Yes [provider]  amLODipine (NORVASC) 5 MG tablet Take 5 mg by mouth at bedtime.   Yes [provider]  budesonide-formoterol (SYMBICORT) 160-4.5 MCG/ACT inhaler Inhale 2 puffs into the lungs 2 (two) times daily.   Yes [provider]  cetirizine (ZYRTEC) 10 MG tablet Take 10 mg by mouth daily.   Yes [provider]  ciprofloxacin (CIPRO) 500 MG tablet Take 1 tablet (500 mg total) by mouth 2 (two) times daily. 12/13/17  Yes Setzer, Rona Ravens, NP  cyclobenzaprine (FLEXERIL) 10 MG tablet TAKE ONE TABLET BY MOUTH 3 TIMES DAILY AS NEEDED FOR MUSCLE SPASM(S). 08/18/17  Yes Carole Civil, MD  diclofenac sodium (VOLTAREN) 1 % GEL Apply 4 g topically 4 (four) times daily. 10/29/17  Yes Carole Civil, MD  famotidine (PEPCID) 20 MG tablet Take 20 mg by mouth as needed for heartburn or indigestion.   Yes [provider]  fluconazole (DIFLUCAN) 100 MG tablet Take 1 tablet (100 mg total) by mouth daily. 12/13/17  Yes Setzer, Terri L, NP  fluticasone (FLONASE) 50 MCG/ACT nasal spray Place 1 spray into both nostrils daily.   Yes [provider]  gabapentin (NEURONTIN) 300 MG capsule Take 1 capsule (300 mg total) by mouth at bedtime. 02/26/17  Yes Carole Civil, MD  IBU 800 MG tablet TAKE (1) TABLET BY MOUTH (3) TIMES DAILY. Patient taking differently: TAKE (1) TABLET BY as needed 08/18/17  Yes Carole Civil, MD  losartan (COZAAR) 100 MG tablet Take 100 mg by mouth at bedtime.    Yes [provider]  meclizine (ANTIVERT) 25 MG tablet Take 25 mg by mouth 3 (three) times daily as needed for dizziness.   Yes [provider]  meloxicam (MOBIC)  7.5 MG tablet Take 7.5 mg by mouth 2 (two) times daily.   Yes [provider]  metroNIDAZOLE (FLAGYL) 500 MG tablet Take 1 tablet (500 mg total) by mouth 2 (two) times daily. 12/13/17  Yes Setzer, Terri L, NP  montelukast (SINGULAIR) 10 MG tablet Take 10 mg by mouth at bedtime.   Yes [provider]  pantoprazole (PROTONIX) 40 MG tablet TAKE (1) TABLET BY MOUTH TWICE DAILY BEFORE A MEAL. Patient taking differently: TAKE (1) TABLET BY MOUTH TWICE DAILY as needed 12/24/16  Yes Rehman, Mechele Dawley, MD  cyclobenzaprine (FLEXERIL) 10 MG tablet Take 1 tablet (10 mg total) by mouth 3 (three) times daily as needed. For muscle spasms 01/11/17   Carole Civil, MD  EPINEPHrine (EPIPEN) 0.3 mg/0.3 mL SOAJ injection Inject 0.3 mLs (0.3  mg total) into the muscle once. 12/15/13   Linton Flemings, MD  ondansetron (ZOFRAN) 4 MG tablet Take 4 mg by mouth every 8 (eight) hours as needed for nausea or vomiting.    [provider]   No results found.  Positive ROS: All other systems have been reviewed and were otherwise negative with the exception of those mentioned in the HPI and as above.  Physical Exam: General: Alert, no acute distress Cardiovascular: No pedal edema Respiratory: No cyanosis, no use of accessory musculature GI: No organomegaly, abdomen is soft and non-tender Skin: No lesions in the area of chief complaint Neurologic: Sensation intact distally Psychiatric: Patient is competent for consent with normal mood and affect Lymphatic: No axillary or cervical lymphadenopathy    Assessment: Left shoulder labral tearing, subacromial impingement, AC joint arthritis.  Plan: - Plan for arthroscopic interventions today with biceps tenodesis, extensive Debridement, subacromial decompression, and distal clavicle resection. - She has no new questions at this time.  We will plan for discharge home from PACU.  All questions solicited and answered to her satisfaction.    Nicholes Stairs, MD Cell [phone]    12/21/2017 11:18 AM

## 2017-12-22 ENCOUNTER — Encounter (HOSPITAL_COMMUNITY): Payer: Self-pay | Admitting: Orthopedic Surgery

## 2017-12-31 MED FILL — CYCLOBENZAPRINE 10 MG TAB: 10 | 15 days supply | Qty: 45 | Fill #0

## 2017-12-31 MED FILL — ZOLPIDEM TARTRATE 5 MG TABL: 5 | 21 days supply | Qty: 21 | Fill #0

## 2018-01-04 ENCOUNTER — Other Ambulatory Visit: Payer: Self-pay | Admitting: Orthopedic Surgery

## 2018-01-04 DIAGNOSIS — M5431 Sciatica, right side: Secondary | ICD-10-CM

## 2018-01-04 MED FILL — GABAPENTIN 300 MG CAPSULE: 300 | 30 days supply | Qty: 30 | Fill #0

## 2018-01-05 ENCOUNTER — Telehealth (HOSPITAL_COMMUNITY): Payer: Self-pay

## 2018-01-05 ENCOUNTER — Encounter (HOSPITAL_COMMUNITY): Payer: Self-pay

## 2018-01-05 ENCOUNTER — Ambulatory Visit (HOSPITAL_COMMUNITY): Payer: PRIVATE HEALTH INSURANCE | Attending: Orthopedic Surgery

## 2018-01-05 ENCOUNTER — Other Ambulatory Visit: Payer: Self-pay

## 2018-01-05 DIAGNOSIS — M25662 Stiffness of left knee, not elsewhere classified: Secondary | ICD-10-CM | POA: Insufficient documentation

## 2018-01-05 DIAGNOSIS — M25512 Pain in left shoulder: Secondary | ICD-10-CM | POA: Insufficient documentation

## 2018-01-05 DIAGNOSIS — R29898 Other symptoms and signs involving the musculoskeletal system: Secondary | ICD-10-CM | POA: Insufficient documentation

## 2018-01-05 NOTE — Telephone Encounter (Signed)
Worker's Estate manager/land agent approved 16 visits letter scanned in epic. [facility] [phone] Fax: [phone] [facility] 01/05/18

## 2018-01-05 NOTE — Patient Instructions (Signed)
TOWEL SLIDES COMPLETE FOR 1-3 MINUTES, 3-5 TIMES PER DAY  SHOULDER: Flexion On Table   Place hands on table, elbows straight. Move hips away from body. Press hands down into table. Hold ___ seconds. ___ reps per set, ___ sets per day, ___ days per week  Abduction (Passive)   With arm out to side, resting on table, lower head toward arm, keeping trunk away from table. Hold ____ seconds. Repeat ____ times. Do ____ sessions per day.  Copyright  VHI. All rights reserved.     Internal Rotation (Assistive)   Seated with elbow bent at right angle and held against side, slide arm on table surface in an inward arc. Repeat ____ times. Do ____ sessions per day. Activity: Use this motion to brush crumbs off the table.  Copyright  VHI. All rights reserved.      AROM: Wrist Extension   With right palm down, bend wrist up. Repeat 10____ times per set. Do ____ sets per session. Do __3__ sessions per day.  Copyright  VHI. All rights reserved.   AROM: Wrist Flexion   With right palm up, bend wrist up. Repeat ___10_ times per set. Do ____ sets per session. Do __3__ sessions per day.  Copyright  VHI. All rights reserved.   AROM: Forearm Pronation / Supination   With right arm in handshake position, slowly rotate palm down until stretch is felt. Relax. Then rotate palm up until stretch is felt. Repeat __10__ times per set. Do ____ sets per session. Do __3__ sessions per day.  Copyright  VHI. All rights reserved.   AFlexion (Passive)   Use other hand to bend elbow, with thumb toward same shoulder. Do NOT force this motion. Repeat __10__ times. Do __3__ sessions per day.  Copyright  VHI. All rights reserved.

## 2018-01-05 NOTE — Therapy (Signed)
[facility] [address] [place], [place], [address] Phone: [phone]   Fax:  [phone]  Occupational Therapy Evaluation  Patient Details  Name: [name] MRN: [id number] Date of Birth: 1966/04/12 Referring Provider: Dr. Victorino December   Encounter Date: 01/05/2018  OT End of Session - 01/05/18 1001    Visit Number  1    Number of Visits  24    Date for OT Re-Evaluation  04/05/18 mini reassess: 02/02/18    Authorization Type  Workers compensation approved 16 visits for her shoulder. If needing more, we will need to request them.     Authorization - Visit Number  1    Authorization - Number of Visits  16    OT Start Time  0915 held up at check in     OT Stop Time  0945    OT Time Calculation (min)  30 min    Activity Tolerance  Patient tolerated treatment well    Behavior During Therapy  [facility] for tasks assessed/performed       Past Medical History:  Diagnosis Date  . Arthritis   . Asthma   . Chest pain    cath 2005 norm cors, repeat cath Feb 2011 normal cors and only minimally  elevated pulmonary pressures  . Crohn disease ([place])   . Diverticulosis   . GERD (gastroesophageal reflux disease)   . Hiatal hernia   . Hypertension   . Obesity   . Palpitations   . Sleep apnea sleep study 11/03/2009    cpap; does sometimes, doesnt use every night.    Past Surgical History:  Procedure Laterality Date  . ABDOMINAL HYSTERECTOMY    . ABDOMINAL HYSTERECTOMY    . ANKLE ARTHROSCOPY  06/01/2012   Procedure: ANKLE ARTHROSCOPY;  Surgeon: Colin Rhein, MD;  Location: [place] [facility];  Service: Orthopedics;  Laterality: Left;  left ankle arthorsocpy with extensive debridement and gastroc slide  . CARDIAC CATHETERIZATION  11/22/2009 and 2005   WNL  . CESAREAN SECTION    . CHOLECYSTECTOMY  09/08/2006   lap. chole.  . CHONDROPLASTY  06/17/2012   Procedure: CHONDROPLASTY;  Surgeon: Carole Civil, MD;  [facility];   Service: Orthopedics;  Laterality: Right;  right patella  . ESOPHAGOGASTRODUODENOSCOPY N/A 05/14/2015   Procedure: ESOPHAGOGASTRODUODENOSCOPY (EGD);  Surgeon: Rogene Houston, MD;  [facility];  Service: Endoscopy;  Laterality: N/A;  730  . INGUINAL HERNIA REPAIR  10/30/2008   right  . KNEE ARTHROSCOPY WITH LATERAL MENISECTOMY Left 12/23/2016   Procedure: LEFT KNEE ARTHROSCOPY WITH LATERAL MENISECTOMY;  Surgeon: Carole Civil, MD;  Location: [facility];  Service: Orthopedics;  Laterality: Left;  . KNEE ARTHROSCOPY WITH MEDIAL MENISECTOMY Left 12/23/2016   Procedure: LEFT KNEE ARTHROSCOPY WITH MEDIAL MENISECTOMY CHONDROPLASTY PATELLA  AND MEDIAL FEMORAL CONDYLE LEFT KNEE;  Surgeon: Carole Civil, MD;  [facility];  Service: Orthopedics;  Laterality: Left;  . SHOULDER ARTHROSCOPY WITH SUBACROMIAL DECOMPRESSION AND BICEP TENDON REPAIR Left 12/21/2017   Procedure: Left shoulder arthroscopic biceps tenodesis, SAD, DCR and labrum debridement;  Surgeon: Nicholes Stairs, MD;  Location: [facility];  Service: Orthopedics;  Laterality: Left;  120 mins  . SHOULDER SURGERY     right x 2     There were no vitals filed for this visit.  Subjective Assessment - 01/05/18 0951    Subjective   S: They didn't have to do anything to the rotator cuff.  Pertinent History  Patient is a 52 y/o female S/P left shoulder arthroscopic debdridement, SLAP, bicep tendonesis, decompression, distal clavical resection on 12/21/17. pt is currently in a sling. Dr. Stann Mainland has referred patient to occupational therapy for evaluation and treatment.     Special Tests  Complete at next visit.    Patient Stated Goals  To be able to return to work and use her left arm normally.    Currently in Pain?  Yes pain with movement        [facility] OT Assessment - 01/05/18 0915      Assessment   Medical Diagnosis  Left shoulder SLAP, debridement, and bicep tenodesis    Referring Provider  Dr. Victorino December    Onset  Date/Surgical Date  12/21/17    Next MD Visit  01/27/18    Prior Therapy  yes for left shoulder pain in Feb. 2019      Precautions   Precautions  Shoulder    Type of Shoulder Precautions  Follow Standard SLAP repair protocol.     Shoulder Interventions  Shoulder sling/immobilizer      Restrictions   Weight Bearing Restrictions  Yes      Balance Screen   Has the patient fallen in the past 6 months  No      Home  Environment   Family/patient expects to be discharged to:  Private residence    Lives With  Family      Prior Function   Level of Independence  Independent    Vocation  Full time employment    Vocation Requirements  lots of walking and standing, reaching overhead     Leisure  spending time with family      ADL   ADL comments  patient is unable to use LUE for any daily tasks at this time.      Mobility   Mobility Status  Independent      Written Expression   Dominant Hand  Right      Vision - History   Baseline Vision  No visual deficits      Cognition   Overall Cognitive Status  Within Functional Limits for tasks assessed      Observation/Other Assessments   Focus on Therapeutic Outcomes (FOTO)   Complete next session      ROM / Strength   AROM / PROM / Strength  AROM;PROM;Strength      Palpation   Palpation comment  max fascial restrictions in left bicep, upper arm, trapezius, and scapularis region.       AROM   Overall AROM   Unable to assess;Due to precautions      PROM   Overall PROM Comments  Assessed supine. IR/er adducted    PROM Assessment Site  Shoulder    Right/Left Shoulder  Left    Left Shoulder Flexion  95 Degrees    Left Shoulder ABduction  90 Degrees    Left Shoulder Internal Rotation  90 Degrees    Left Shoulder External Rotation  30 Degrees      Strength   Overall Strength  Unable to assess;Due to precautions                      OT Education - 01/05/18 1001    Education provided  Yes    Education Details  towel  slides, wrist A/ROM, elbow P/ROM and A/ROM    Person(s) Educated  Patient    Methods  Explanation;Demonstration;Verbal cues;Handout  Comprehension  Returned demonstration;Verbalized understanding       OT Short Term Goals - 01/05/18 1008      OT SHORT TERM GOAL #1   Title  Patient will be educated and independent with HEP for left shoulder mobility for improved abiilty to return to normal work and daily tasks.     Time  6    Period  Weeks    Status  New    Target Date  02/16/18      OT SHORT TERM GOAL #2   Title  patient will improve Left shoulder P/ROM to WNL to increase ability to complete dressing tasks with less difficulty.     Time  6    Period  Weeks    Status  New      OT SHORT TERM GOAL #3   Title  Patient will increase left shoulder strength to 3+/5 in order to complete lightweight household actiivities with LUE.     Time  6    Period  Weeks    Status  New      OT SHORT TERM GOAL #4   Title  Patient will decrease pain level in left shoulder to 5/10 with daily task completion.     Time  6    Period  Weeks    Status  New      OT SHORT TERM GOAL #5   Title  Patient will decrease fascial restrictions in LUE to mod amount in order to increase functional mobility needed to complete overhead and reaching tasks.     Time  6    Period  Weeks    Status  New        OT Long Term Goals - 01/05/18 1010      OT LONG TERM GOAL #1   Title  Patient will return to highest level of independence with her LUE in order to return to work and complete required tasks.     Time  12    Period  Weeks    Status  New    Target Date  03/30/18      OT LONG TERM GOAL #2   Title  patient will increase A/ROM of LUE to WNL to increase ability to complete over head reaching tasks without difficulty.     Time  12    Period  Weeks    Status  New      OT LONG TERM GOAL #3   Title  Patient will increase LUE strength to 4+/5 to increase ability to complete chest compressions during CPR  when needed.     Time  12    Period  Weeks    Status  New      OT LONG TERM GOAL #4   Title  Patient will decrease fascial restrictions to min amount or less in order to increase functional mobility needed for overhead and reaching tasks.     Time  12    Period  Weeks    Status  New      OT LONG TERM GOAL #5   Title  Patient will report a decreased level of pain of approximately 2/10 when completing work or daily tasks.     Time  12    Period  Weeks    Status  New            Plan - 01/05/18 1004    Clinical Impression Statement  A: Patient is a 52 y/o female S/P left  shoulder repair causing increased pain, fascial restrictions, decreased ROM and strength resulting in difficulty completing daily tasks with her LUE. No protocol sent from MD office so standard SLAP repair protocol will be used.     Occupational Profile and client history currently impacting functional performance  patient is motivated to return to prior level of function.     Occupational performance deficits (Please refer to evaluation for details):  ADL's;IADL's;Work;Leisure;Rest and Sleep    Rehab Potential  Excellent    Current Impairments/barriers affecting progress:  N/A    OT Frequency  2x / week    OT Duration  12 weeks    OT Treatment/Interventions  Self-care/ADL training;Therapeutic exercise;Manual Therapy;Ultrasound;Iontophoresis;Therapeutic activities;Cryotherapy;DME and/or AE instruction;Electrical Stimulation;Moist Heat;Passive range of motion;Patient/family education    Plan  P: patient will benefit from skilled OT services to increase functional performance while using her LUE during work and daily tasks. Treatment Plan: Follow protocol. Myofascial release, manual stretching, P/ROM, AA/ROM,  A/ROM, general strengthening. COMPLETE FOTO NEXT SESSION PLEASE.    Clinical Decision Making  Several treatment options, min-mod task modification necessary    Consulted and Agree with Plan of Care  Patient        Patient will benefit from skilled therapeutic intervention in order to improve the following deficits and impairments:  Pain, Decreased range of motion, Impaired UE functional use, Increased fascial restrictions, Decreased strength, Impaired flexibility  Visit Diagnosis: Stiffness of left knee, not elsewhere classified - Plan: Ot plan of care cert/re-cert  Other symptoms and signs involving the musculoskeletal system - Plan: Ot plan of care cert/re-cert  Acute pain of left shoulder - Plan: Ot plan of care cert/re-cert    Problem List Patient Active Problem List   Diagnosis Date Noted  . Derangement of anterior horn of lateral meniscus of left knee   . Derangement of posterior horn of medial meniscus of left knee   . Chest pain 04/25/2016  . Hypokalemia 04/25/2016  . Hyperglycemia 04/25/2016  . Diverticulitis of colon 07/17/2014  . Nausea without vomiting 07/17/2014  . Crohn disease ([place]) 07/17/2014  . Diverticulitis of colon without hemorrhage 08/10/2013  . History of arthroscopy of right knee 07/25/2012  . Difficulty in walking(719.7) 06/29/2012  . Weakness of right leg 06/29/2012  . Posterior tibial tendonitis 11/11/2011  . PTTD (posterior tibial tendon dysfunction) 11/11/2011  . Knee pain, right 11/11/2011  . Chest pain 07/16/2011  . Primary osteoarthritis of left knee 02/25/2010  . PAIN IN JOINT, MULTIPLE SITES 02/25/2010  . Essential hypertension 08/13/2009  . HIATAL HERNIA 08/13/2009  . PALPITATIONS 08/13/2009  . DYSPNEA 08/13/2009   Ailene Ravel, OTR/L,CBIS  [phone]  01/05/2018, 10:17 AM  [facility] [address] [place], [place], [address] Phone: [phone]   Fax:  [phone]  Name: [name] MRN: [id number] Date of Birth: 16-Aug-1966

## 2018-01-12 ENCOUNTER — Encounter (HOSPITAL_COMMUNITY): Payer: Self-pay | Admitting: Specialist

## 2018-01-12 ENCOUNTER — Ambulatory Visit (HOSPITAL_COMMUNITY): Payer: PRIVATE HEALTH INSURANCE | Attending: Orthopedic Surgery | Admitting: Specialist

## 2018-01-12 DIAGNOSIS — R29898 Other symptoms and signs involving the musculoskeletal system: Secondary | ICD-10-CM | POA: Insufficient documentation

## 2018-01-12 DIAGNOSIS — M25512 Pain in left shoulder: Secondary | ICD-10-CM | POA: Diagnosis not present

## 2018-01-12 DIAGNOSIS — M25612 Stiffness of left shoulder, not elsewhere classified: Secondary | ICD-10-CM | POA: Insufficient documentation

## 2018-01-12 NOTE — Therapy (Signed)
[facility] [place], [place], [address] Phone: [phone]   Fax:  [phone]  Occupational Therapy Treatment  Patient Details  Name: [name] MRN: [id number] Date of Birth: [birth year] Referring Provider: Dr. Victorino December   Encounter Date: 01/12/2018  OT End of Session - 01/12/18 1421    Visit Number  2    Number of Visits  24    Date for OT Re-Evaluation  04/05/18 mini reassess: 02/02/18    Authorization Type  Workers compensation approved 16 visits for her shoulder. If needing more, we will need to request them.     Authorization - Visit Number  2    Authorization - Number of Visits  16    OT Start Time  1140    OT Stop Time  1220    OT Time Calculation (min)  40 min    Activity Tolerance  Patient tolerated treatment well    Behavior During Therapy  WFL for tasks assessed/performed       Past Medical History:  Diagnosis Date  . Arthritis   . Asthma   . Chest pain    cath 2005 norm cors, repeat cath Feb 2011 normal cors and only minimally  elevated pulmonary pressures  . Crohn disease ([place])   . Diverticulosis   . GERD (gastroesophageal reflux disease)   . Hiatal hernia   . Hypertension   . Obesity   . Palpitations   . Sleep apnea sleep study 11/03/2009    cpap; does sometimes, doesnt use every night.    Past Surgical History:  Procedure Laterality Date  . ABDOMINAL HYSTERECTOMY    . ABDOMINAL HYSTERECTOMY    . ANKLE ARTHROSCOPY  06/01/2012   Procedure: ANKLE ARTHROSCOPY;  Surgeon: Colin Rhein, MD;  Location: [facility];  Service: Orthopedics;  Laterality: Left;  left ankle arthorsocpy with extensive debridement and gastroc slide  . CARDIAC CATHETERIZATION  11/22/2009 and 2005   WNL  . CESAREAN SECTION    . CHOLECYSTECTOMY  09/08/2006   lap. chole.  . CHONDROPLASTY  06/17/2012   Procedure: CHONDROPLASTY;  Surgeon: Carole Civil, MD;  [facility];  Service: Orthopedics;   Laterality: Right;  right patella  . ESOPHAGOGASTRODUODENOSCOPY N/A 05/14/2015   Procedure: ESOPHAGOGASTRODUODENOSCOPY (EGD);  Surgeon: Rogene Houston, MD;  [facility];  Service: Endoscopy;  Laterality: N/A;  730  . INGUINAL HERNIA REPAIR  10/30/2008   right  . KNEE ARTHROSCOPY WITH LATERAL MENISECTOMY Left 12/23/2016   Procedure: LEFT KNEE ARTHROSCOPY WITH LATERAL MENISECTOMY;  Surgeon: Carole Civil, MD;  [facility];  Service: Orthopedics;  Laterality: Left;  . KNEE ARTHROSCOPY WITH MEDIAL MENISECTOMY Left 12/23/2016   Procedure: LEFT KNEE ARTHROSCOPY WITH MEDIAL MENISECTOMY CHONDROPLASTY PATELLA  AND MEDIAL FEMORAL CONDYLE LEFT KNEE;  Surgeon: Carole Civil, MD;  [facility];  Service: Orthopedics;  Laterality: Left;  . SHOULDER ARTHROSCOPY WITH SUBACROMIAL DECOMPRESSION AND BICEP TENDON REPAIR Left 12/21/2017   Procedure: Left shoulder arthroscopic biceps tenodesis, SAD, DCR and labrum debridement;  Surgeon: Nicholes Stairs, MD;  Location: [facility];  Service: Orthopedics;  Laterality: Left;  120 mins  . SHOULDER SURGERY     right x 2     There were no vitals filed for this visit.  Subjective Assessment - 01/12/18 1413    Subjective   S:  I have been doing my exercises at home as much as possible.    Currently in  Pain?  Yes    Pain Score  4     Pain Location  Shoulder    Pain Orientation  Left    Pain Descriptors / Indicators  Aching    Pain Type  Acute pain    Pain Onset  More than a month ago    Pain Frequency  Constant    Aggravating Factors   arm movement    Pain Relieving Factors  ice    Effect of Pain on Daily Activities  moderate         [facility] OT Assessment - 01/12/18 0001      Assessment   Medical Diagnosis  Left shoulder SLAP, debridement, and bicep tenodesis      Precautions   Precautions  Shoulder    Type of Shoulder Precautions  Follow Standard SLAP repair protocol.     Shoulder Interventions  Shoulder sling/immobilizer       Observation/Other Assessments   Focus on Therapeutic Outcomes (FOTO)   21.72               OT Treatments/Exercises (OP) - 01/12/18 0001      Exercises   Exercises  Shoulder      Shoulder Exercises: Supine   External Rotation  PROM;AAROM;10 reps to 30 degrees    Internal Rotation  PROM;10 reps    Flexion  PROM;AAROM;10 reps to 90    ABduction  PROM;AAROM;10 reps to 90       Shoulder Exercises: Seated   Elevation  AROM;10 reps    Extension  AROM;10 reps    Row  AROM;10 reps    Other Seated Exercises  seated elbow flexion, extension, supination, pronation, wrist flexion, extension 10 times       Shoulder Exercises: Therapy Ball   Flexion  10 reps    Flexion Limitations  to 90    ABduction  10 reps    ABduction Limitations  to 90       Manual Therapy   Manual Therapy  Myofascial release    Manual therapy comments  completed seperately from all other skilled interventions    Myofascial Release  myofascial release to left shoulder, scapular, and upper arm region to decrease pain and improve pain free mobility.              OT Education - 01/12/18 1421    Education provided  Yes    Education Details  reviewed HEP.      Person(s) Educated  Patient    Methods  Explanation    Comprehension  Verbalized understanding       OT Short Term Goals - 01/12/18 1423      OT SHORT TERM GOAL #1   Title  Patient will be educated and independent with HEP for left shoulder mobility for improved abiilty to return to normal work and daily tasks.     Time  6    Period  Weeks    Status  On-going      OT SHORT TERM GOAL #2   Title  patient will improve Left shoulder P/ROM to WNL to increase ability to complete dressing tasks with less difficulty.     Time  6    Period  Weeks    Status  On-going      OT SHORT TERM GOAL #3   Title  Patient will increase left shoulder strength to 3+/5 in order to complete lightweight household actiivities with LUE.     Time  6  Period   Weeks    Status  On-going      OT SHORT TERM GOAL #4   Title  Patient will decrease pain level in left shoulder to 5/10 with daily task completion.     Time  6    Period  Weeks    Status  On-going      OT SHORT TERM GOAL #5   Title  Patient will decrease fascial restrictions in LUE to mod amount in order to increase functional mobility needed to complete overhead and reaching tasks.     Time  6    Period  Weeks    Status  On-going        OT Long Term Goals - 01/12/18 1424      OT LONG TERM GOAL #1   Title  Patient will return to highest level of independence with her LUE in order to return to work and complete required tasks.     Time  12    Period  Weeks    Status  On-going      OT LONG TERM GOAL #2   Title  patient will increase A/ROM of LUE to WNL to increase ability to complete over head reaching tasks without difficulty.     Time  12    Period  Weeks    Status  On-going      OT LONG TERM GOAL #3   Title  Patient will increase LUE strength to 4+/5 to increase ability to complete chest compressions during CPR when needed.     Time  12    Period  Weeks    Status  On-going      OT LONG TERM GOAL #4   Title  Patient will decrease fascial restrictions to min amount or less in order to increase functional mobility needed for overhead and reaching tasks.     Time  12    Period  Weeks    Status  On-going      OT LONG TERM GOAL #5   Title  Patient will report a decreased level of pain of approximately 2/10 when completing work or daily tasks.     Time  12    Period  Weeks    Status  On-going            Plan - 01/12/18 1422    Clinical Impression Statement  A:  Patient able to tolerate P/ROM within parameters of protocol into flexion this date.  Patient with tenderness and moderate fascial restrictions in anterior and posterior shoulder region, less restrictions noted after MFR intervention.     Plan  P:  review plan of care and issue a copy to patient.  Continue  manual therapy to decrease tightness fascial restrictions and allow for greater available aa/rom and a/rom.         Patient will benefit from skilled therapeutic intervention in order to improve the following deficits and impairments:  Pain, Decreased range of motion, Impaired UE functional use, Increased fascial restrictions, Decreased strength, Impaired flexibility  Visit Diagnosis: Other symptoms and signs involving the musculoskeletal system  Acute pain of left shoulder    Problem List Patient Active Problem List   Diagnosis Date Noted  . Derangement of anterior horn of lateral meniscus of left knee   . Derangement of posterior horn of medial meniscus of left knee   . Chest pain 04/25/2016  . Hypokalemia 04/25/2016  . Hyperglycemia 04/25/2016  . Diverticulitis of colon 07/17/2014  .  Nausea without vomiting 07/17/2014  . Crohn disease ([place]) 07/17/2014  . Diverticulitis of colon without hemorrhage 08/10/2013  . History of arthroscopy of right knee 07/25/2012  . Difficulty in walking(719.7) 06/29/2012  . Weakness of right leg 06/29/2012  . Posterior tibial tendonitis 11/11/2011  . PTTD (posterior tibial tendon dysfunction) 11/11/2011  . Knee pain, right 11/11/2011  . Chest pain 07/16/2011  . Primary osteoarthritis of left knee 02/25/2010  . PAIN IN JOINT, MULTIPLE SITES 02/25/2010  . Essential hypertension 08/13/2009  . HIATAL HERNIA 08/13/2009  . PALPITATIONS 08/13/2009  . DYSPNEA 08/13/2009    Vangie Bicker, [facility], OTR/L [phone]  01/12/2018, 2:35 PM  [facility] [place], [place], [address] Phone: [phone]   Fax:  [phone]  Name: [name] MRN: [id number] Date of Birth: [birth year]

## 2018-01-13 ENCOUNTER — Encounter (HOSPITAL_COMMUNITY): Payer: Self-pay | Admitting: Occupational Therapy

## 2018-01-13 ENCOUNTER — Ambulatory Visit (HOSPITAL_COMMUNITY): Payer: PRIVATE HEALTH INSURANCE | Admitting: Occupational Therapy

## 2018-01-13 DIAGNOSIS — M25512 Pain in left shoulder: Secondary | ICD-10-CM

## 2018-01-13 DIAGNOSIS — R29898 Other symptoms and signs involving the musculoskeletal system: Secondary | ICD-10-CM | POA: Diagnosis not present

## 2018-01-13 DIAGNOSIS — M25612 Stiffness of left shoulder, not elsewhere classified: Secondary | ICD-10-CM | POA: Diagnosis not present

## 2018-01-13 NOTE — Therapy (Signed)
[place], [place], [address] Phone: [phone]   Fax:  [phone]  Occupational Therapy Treatment  Patient Details  Name: [name] MRN: [id number] Date of Birth: November 30, 1965 Referring Provider: Dr. Victorino December   Encounter Date: 01/13/2018  OT End of Session - 01/13/18 1523    Visit Number  3    Number of Visits  24    Date for OT Re-Evaluation  04/05/18 mini reassess: 02/02/18    Authorization Type  Workers compensation approved 16 visits for her shoulder. If needing more, we will need to request them.     Authorization - Visit Number  3    Authorization - Number of Visits  16    OT Start Time  2694    OT Stop Time  1349    OT Time Calculation (min)  46 min    Activity Tolerance  Patient tolerated treatment well    Behavior During Therapy  WFL for tasks assessed/performed       Past Medical History:  Diagnosis Date  . Arthritis   . Asthma   . Chest pain    cath 2005 norm cors, repeat cath Feb 2011 normal cors and only minimally  elevated pulmonary pressures  . Crohn disease ([place])   . Diverticulosis   . GERD (gastroesophageal reflux disease)   . Hiatal hernia   . Hypertension   . Obesity   . Palpitations   . Sleep apnea sleep study 11/03/2009    cpap; does sometimes, doesnt use every night.    Past Surgical History:  Procedure Laterality Date  . ABDOMINAL HYSTERECTOMY    . ABDOMINAL HYSTERECTOMY    . ANKLE ARTHROSCOPY  06/01/2012   Procedure: ANKLE ARTHROSCOPY;  Surgeon: Colin Rhein, MD;  Location: [facility];  Service: Orthopedics;  Laterality: Left;  left ankle arthorsocpy with extensive debridement and gastroc slide  . CARDIAC CATHETERIZATION  11/22/2009 and 2005   WNL  . CESAREAN SECTION    . CHOLECYSTECTOMY  09/08/2006   lap. chole.  . CHONDROPLASTY  06/17/2012   Procedure: CHONDROPLASTY;  Surgeon: Carole Civil, MD;  Location: [facility];  Service: Orthopedics;   Laterality: Right;  right patella  . ESOPHAGOGASTRODUODENOSCOPY N/A 05/14/2015   Procedure: ESOPHAGOGASTRODUODENOSCOPY (EGD);  Surgeon: Rogene Houston, MD;  [facility];  Service: Endoscopy;  Laterality: N/A;  730  . INGUINAL HERNIA REPAIR  10/30/2008   right  . KNEE ARTHROSCOPY WITH LATERAL MENISECTOMY Left 12/23/2016   Procedure: LEFT KNEE ARTHROSCOPY WITH LATERAL MENISECTOMY;  Surgeon: Carole Civil, MD;  Location: [facility];  Service: Orthopedics;  Laterality: Left;  . KNEE ARTHROSCOPY WITH MEDIAL MENISECTOMY Left 12/23/2016   Procedure: LEFT KNEE ARTHROSCOPY WITH MEDIAL MENISECTOMY CHONDROPLASTY PATELLA  AND MEDIAL FEMORAL CONDYLE LEFT KNEE;  Surgeon: Carole Civil, MD;  Location: [facility];  Service: Orthopedics;  Laterality: Left;  . SHOULDER ARTHROSCOPY WITH SUBACROMIAL DECOMPRESSION AND BICEP TENDON REPAIR Left 12/21/2017   Procedure: Left shoulder arthroscopic biceps tenodesis, SAD, DCR and labrum debridement;  Surgeon: Nicholes Stairs, MD;  Location: [facility];  Service: Orthopedics;  Laterality: Left;  120 mins  . SHOULDER SURGERY     right x 2     There were no vitals filed for this visit.  Subjective Assessment - 01/13/18 1305    Subjective   S: I didn't sleep at all.     Currently in Pain?  Yes    Pain  Score  5     Pain Location  Shoulder    Pain Orientation  Left    Pain Descriptors / Indicators  Aching;Sore    Pain Type  Acute pain    Pain Radiating Towards  elbow and hand    Pain Onset  More than a month ago    Pain Frequency  Constant    Aggravating Factors   arm movement    Pain Relieving Factors  ice, pain medication    Effect of Pain on Daily Activities  mod effect on ADLs    Multiple Pain Sites  No         [facility] OT Assessment - 01/13/18 1304      Assessment   Medical Diagnosis  Left shoulder SLAP, debridement, and bicep tenodesis      Precautions   Precautions  Shoulder    Type of Shoulder Precautions  Follow Standard SLAP repair  protocol.     Shoulder Interventions  Shoulder sling/immobilizer               OT Treatments/Exercises (OP) - 01/13/18 1306      Exercises   Exercises  Shoulder      Shoulder Exercises: Supine   Protraction  PROM;AAROM;10 reps    External Rotation  PROM;AAROM;10 reps 30 degrees    Internal Rotation  PROM;10 reps    Flexion  PROM;AAROM;10 reps to 90    ABduction  PROM;AAROM;10 reps to 90      Shoulder Exercises: Seated   Elevation  AROM;10 reps    Extension  AROM;10 reps    Row  AROM;10 reps      Manual Therapy   Manual Therapy  Myofascial release    Manual therapy comments  completed seperately from all other skilled interventions    Myofascial Release  myofascial release to left shoulder, scapular, and upper arm region to decrease pain and improve pain free mobility.              OT Education - 01/12/18 1421    Education provided  Yes    Education Details  reviewed HEP.      Person(s) Educated  Patient    Methods  Explanation    Comprehension  Verbalized understanding       OT Short Term Goals - 01/12/18 1423      OT SHORT TERM GOAL #1   Title  Patient will be educated and independent with HEP for left shoulder mobility for improved abiilty to return to normal work and daily tasks.     Time  6    Period  Weeks    Status  On-going      OT SHORT TERM GOAL #2   Title  patient will improve Left shoulder P/ROM to WNL to increase ability to complete dressing tasks with less difficulty.     Time  6    Period  Weeks    Status  On-going      OT SHORT TERM GOAL #3   Title  Patient will increase left shoulder strength to 3+/5 in order to complete lightweight household actiivities with LUE.     Time  6    Period  Weeks    Status  On-going      OT SHORT TERM GOAL #4   Title  Patient will decrease pain level in left shoulder to 5/10 with daily task completion.     Time  6    Period  Weeks  Status  On-going      OT SHORT TERM GOAL #5   Title  Patient  will decrease fascial restrictions in LUE to mod amount in order to increase functional mobility needed to complete overhead and reaching tasks.     Time  6    Period  Weeks    Status  On-going        OT Long Term Goals - 01/12/18 1424      OT LONG TERM GOAL #1   Title  Patient will return to highest level of independence with her LUE in order to return to work and complete required tasks.     Time  12    Period  Weeks    Status  On-going      OT LONG TERM GOAL #2   Title  patient will increase A/ROM of LUE to WNL to increase ability to complete over head reaching tasks without difficulty.     Time  12    Period  Weeks    Status  On-going      OT LONG TERM GOAL #3   Title  Patient will increase LUE strength to 4+/5 to increase ability to complete chest compressions during CPR when needed.     Time  12    Period  Weeks    Status  On-going      OT LONG TERM GOAL #4   Title  Patient will decrease fascial restrictions to min amount or less in order to increase functional mobility needed for overhead and reaching tasks.     Time  12    Period  Weeks    Status  On-going      OT LONG TERM GOAL #5   Title  Patient will report a decreased level of pain of approximately 2/10 when completing work or daily tasks.     Time  12    Period  Weeks    Status  On-going            Plan - 01/13/18 1523    Clinical Impression Statement  A: Continued with manual therapy at beginning of session to decrease fascial restrictions and improve ROM, pt with good response and ROM within protocol limits achieved. Continued with scapular A/ROM for improved scapular mobility, verbal cuing for form and technique.     Plan  P: continue with manual therapy and ROM within protocol, add pulleys       Patient will benefit from skilled therapeutic intervention in order to improve the following deficits and impairments:  Pain, Decreased range of motion, Impaired UE functional use, Increased fascial  restrictions, Decreased strength, Impaired flexibility  Visit Diagnosis: Other symptoms and signs involving the musculoskeletal system  Acute pain of left shoulder    Problem List Patient Active Problem List   Diagnosis Date Noted  . Derangement of anterior horn of lateral meniscus of left knee   . Derangement of posterior horn of medial meniscus of left knee   . Chest pain 04/25/2016  . Hypokalemia 04/25/2016  . Hyperglycemia 04/25/2016  . Diverticulitis of colon 07/17/2014  . Nausea without vomiting 07/17/2014  . Crohn disease ([place]) 07/17/2014  . Diverticulitis of colon without hemorrhage 08/10/2013  . History of arthroscopy of right knee 07/25/2012  . Difficulty in walking(719.7) 06/29/2012  . Weakness of right leg 06/29/2012  . Posterior tibial tendonitis 11/11/2011  . PTTD (posterior tibial tendon dysfunction) 11/11/2011  . Knee pain, right 11/11/2011  . Chest pain 07/16/2011  .  Primary osteoarthritis of left knee 02/25/2010  . PAIN IN JOINT, MULTIPLE SITES 02/25/2010  . Essential hypertension 08/13/2009  . HIATAL HERNIA 08/13/2009  . PALPITATIONS 08/13/2009  . DYSPNEA 08/13/2009   Guadelupe Sabin, OTR/L  [phone] 01/13/2018, 3:26 PM  [facility] [address] [place], [place], [address] Phone: [phone]   Fax:  [phone]  Name: ARELIE KUZEL MRN: [id number] Date of Birth: September 01, 1966

## 2018-01-19 ENCOUNTER — Encounter (HOSPITAL_COMMUNITY): Payer: Self-pay | Admitting: Occupational Therapy

## 2018-01-19 ENCOUNTER — Ambulatory Visit (HOSPITAL_COMMUNITY): Payer: PRIVATE HEALTH INSURANCE | Admitting: Occupational Therapy

## 2018-01-19 DIAGNOSIS — R29898 Other symptoms and signs involving the musculoskeletal system: Secondary | ICD-10-CM

## 2018-01-19 DIAGNOSIS — M25612 Stiffness of left shoulder, not elsewhere classified: Secondary | ICD-10-CM | POA: Diagnosis not present

## 2018-01-19 DIAGNOSIS — M25512 Pain in left shoulder: Secondary | ICD-10-CM | POA: Diagnosis not present

## 2018-01-19 NOTE — Therapy (Addendum)
[facility], [place], [address] Phone: [phone]   Fax:  [phone]  Occupational Therapy Treatment  Patient Details  Name: [name] MRN: [id number] Date of Birth: 18-Dec-1965 Referring Provider: Dr. Victorino December   Encounter Date: 01/19/2018  OT End of Session - 01/19/18 1334    Visit Number  4    Number of Visits  24    Date for OT Re-Evaluation  04/05/18 mini reassess: 02/02/18    Authorization Type  Workers compensation approved 16 visits for her shoulder. If needing more, we will need to request them.     Authorization - Visit Number  4    Authorization - Number of Visits  16    OT Start Time  6761    OT Stop Time  1348    OT Time Calculation (min)  41 min    Activity Tolerance  Patient tolerated treatment well    Behavior During Therapy  WFL for tasks assessed/performed       Past Medical History:  Diagnosis Date  . Arthritis   . Asthma   . Chest pain    cath 2005 norm cors, repeat cath Feb 2011 normal cors and only minimally  elevated pulmonary pressures  . Crohn disease ([place])   . Diverticulosis   . GERD (gastroesophageal reflux disease)   . Hiatal hernia   . Hypertension   . Obesity   . Palpitations   . Sleep apnea sleep study 11/03/2009    cpap; does sometimes, doesnt use every night.    Past Surgical History:  Procedure Laterality Date  . ABDOMINAL HYSTERECTOMY    . ABDOMINAL HYSTERECTOMY    . ANKLE ARTHROSCOPY  06/01/2012   Procedure: ANKLE ARTHROSCOPY;  Surgeon: Colin Rhein, MD;  Location: [facility];  Service: Orthopedics;  Laterality: Left;  left ankle arthorsocpy with extensive debridement and gastroc slide  . CARDIAC CATHETERIZATION  11/22/2009 and 2005   WNL  . CESAREAN SECTION    . CHOLECYSTECTOMY  09/08/2006   lap. chole.  . CHONDROPLASTY  06/17/2012   Procedure: CHONDROPLASTY;  Surgeon: Carole Civil, MD;  [facility];  Service: Orthopedics;   Laterality: Right;  right patella  . ESOPHAGOGASTRODUODENOSCOPY N/A 05/14/2015   Procedure: ESOPHAGOGASTRODUODENOSCOPY (EGD);  Surgeon: Rogene Houston, MD;  [facility];  Service: Endoscopy;  Laterality: N/A;  730  . INGUINAL HERNIA REPAIR  10/30/2008   right  . KNEE ARTHROSCOPY WITH LATERAL MENISECTOMY Left 12/23/2016   Procedure: LEFT KNEE ARTHROSCOPY WITH LATERAL MENISECTOMY;  Surgeon: Carole Civil, MD;  [facility];  Service: Orthopedics;  Laterality: Left;  . KNEE ARTHROSCOPY WITH MEDIAL MENISECTOMY Left 12/23/2016   Procedure: LEFT KNEE ARTHROSCOPY WITH MEDIAL MENISECTOMY CHONDROPLASTY PATELLA  AND MEDIAL FEMORAL CONDYLE LEFT KNEE;  Surgeon: Carole Civil, MD;  Location[facility];  Service: Orthopedics;  Laterality: Left;  . SHOULDER ARTHROSCOPY WITH SUBACROMIAL DECOMPRESSION AND BICEP TENDON REPAIR Left 12/21/2017   Procedure: Left shoulder arthroscopic biceps tenodesis, SAD, DCR and labrum debridement;  Surgeon: Nicholes Stairs, MD;  Location: [facility];  Service: Orthopedics;  Laterality: Left;  120 mins  . SHOULDER SURGERY     right x 2     There were no vitals filed for this visit.  Subjective Assessment - 01/19/18 1307    Subjective   S: I've been doing some of my exercises.     Currently in Pain?  Yes  Pain Score  4     Pain Location  Shoulder    Pain Orientation  Left    Pain Descriptors / Indicators  Aching;Sore    Pain Type  Acute pain    Pain Radiating Towards  elbow and hand    Pain Onset  More than a month ago    Pain Frequency  Constant    Aggravating Factors   arm movement    Pain Relieving Factors  ice, pain medication    Effect of Pain on Daily Activities  mod effect on ADLs    Multiple Pain Sites  No         [facility] OT Assessment - 01/19/18 1307      Assessment   Medical Diagnosis  Left shoulder SLAP, debridement, and bicep tenodesis      Precautions   Precautions  Shoulder    Type of Shoulder Precautions  Follow Standard SLAP  repair protocol.     Shoulder Interventions  Shoulder sling/immobilizer               OT Treatments/Exercises (OP) - 01/19/18 1308      Exercises   Exercises  Shoulder      Shoulder Exercises: Supine   Protraction  PROM;AAROM;10 reps    External Rotation  PROM;AAROM;10 reps to 30 degrees    Internal Rotation  PROM;10 reps    Flexion  PROM;AAROM;10 reps to 90 degrees    ABduction  PROM;AAROM;10 reps to 90 degrees      Shoulder Exercises: Seated   Elevation  AROM;10 reps    Extension  AROM;10 reps    Row  AROM;10 reps      Shoulder Exercises: Pulleys   Flexion  1 minute    Flexion Limitations  to 90 degrees      Shoulder Exercises: Isometric Strengthening   External Rotation  Supine;3X5"    Internal Rotation  Supine;3X5"      Manual Therapy   Manual Therapy  Myofascial release    Manual therapy comments  completed seperately from all other skilled interventions    Myofascial Release  myofascial release to left shoulder, scapular, and upper arm region to decrease pain and improve pain free mobility.                OT Short Term Goals - 01/12/18 1423      OT SHORT TERM GOAL #1   Title  Patient will be educated and independent with HEP for left shoulder mobility for improved abiilty to return to normal work and daily tasks.     Time  6    Period  Weeks    Status  On-going      OT SHORT TERM GOAL #2   Title  patient will improve Left shoulder P/ROM to WNL to increase ability to complete dressing tasks with less difficulty.     Time  6    Period  Weeks    Status  On-going      OT SHORT TERM GOAL #3   Title  Patient will increase left shoulder strength to 3+/5 in order to complete lightweight household actiivities with LUE.     Time  6    Period  Weeks    Status  On-going      OT SHORT TERM GOAL #4   Title  Patient will decrease pain level in left shoulder to 5/10 with daily task completion.     Time  6    Period  Weeks  Status  On-going       OT SHORT TERM GOAL #5   Title  Patient will decrease fascial restrictions in LUE to mod amount in order to increase functional mobility needed to complete overhead and reaching tasks.     Time  6    Period  Weeks    Status  On-going        OT Long Term Goals - 01/12/18 1424      OT LONG TERM GOAL #1   Title  Patient will return to highest level of independence with her LUE in order to return to work and complete required tasks.     Time  12    Period  Weeks    Status  On-going      OT LONG TERM GOAL #2   Title  patient will increase A/ROM of LUE to WNL to increase ability to complete over head reaching tasks without difficulty.     Time  12    Period  Weeks    Status  On-going      OT LONG TERM GOAL #3   Title  Patient will increase LUE strength to 4+/5 to increase ability to complete chest compressions during CPR when needed.     Time  12    Period  Weeks    Status  On-going      OT LONG TERM GOAL #4   Title  Patient will decrease fascial restrictions to min amount or less in order to increase functional mobility needed for overhead and reaching tasks.     Time  12    Period  Weeks    Status  On-going      OT LONG TERM GOAL #5   Title  Patient will report a decreased level of pain of approximately 2/10 when completing work or daily tasks.     Time  12    Period  Weeks    Status  On-going            Plan - 01/19/18 1334    Clinical Impression Statement  A: Manual therapy completed to deltoid and trapezius regions to address fascial restrictions causing increased pain and limiting mobility. Added isometric IR/er per protocol this session, also added flexion pulley to 90 degrees. Verbal cuing during session for form and technique.     Plan  P: continue with manual therapy and improving ROM within protocol, resume therapy ball stretches       Patient will benefit from skilled therapeutic intervention in order to improve the following deficits and impairments:  Pain,  Decreased range of motion, Impaired UE functional use, Increased fascial restrictions, Decreased strength, Impaired flexibility  Visit Diagnosis: Other symptoms and signs involving the musculoskeletal system  Acute pain of left shoulder    Problem List Patient Active Problem List   Diagnosis Date Noted  . Derangement of anterior horn of lateral meniscus of left knee   . Derangement of posterior horn of medial meniscus of left knee   . Chest pain 04/25/2016  . Hypokalemia 04/25/2016  . Hyperglycemia 04/25/2016  . Diverticulitis of colon 07/17/2014  . Nausea without vomiting 07/17/2014  . Crohn disease ([place]) 07/17/2014  . Diverticulitis of colon without hemorrhage 08/10/2013  . History of arthroscopy of right knee 07/25/2012  . Difficulty in walking(719.7) 06/29/2012  . Weakness of right leg 06/29/2012  . Posterior tibial tendonitis 11/11/2011  . PTTD (posterior tibial tendon dysfunction) 11/11/2011  . Knee pain, right 11/11/2011  . Chest  pain 07/16/2011  . Primary osteoarthritis of left knee 02/25/2010  . PAIN IN JOINT, MULTIPLE SITES 02/25/2010  . Essential hypertension 08/13/2009  . HIATAL HERNIA 08/13/2009  . PALPITATIONS 08/13/2009  . DYSPNEA 08/13/2009   Guadelupe Sabin, OTR/L  [phone] 01/19/2018, 2:32 PM  [facility] [address] [place], [place], [address] Phone: [phone]   Fax:  [phone]  Name: [name] MRN: [id number] Date of Birth: Nov 02, 1965

## 2018-01-21 ENCOUNTER — Encounter (HOSPITAL_COMMUNITY): Payer: Self-pay | Admitting: Occupational Therapy

## 2018-01-21 ENCOUNTER — Ambulatory Visit (HOSPITAL_COMMUNITY): Payer: PRIVATE HEALTH INSURANCE | Admitting: Occupational Therapy

## 2018-01-21 DIAGNOSIS — M25612 Stiffness of left shoulder, not elsewhere classified: Secondary | ICD-10-CM | POA: Diagnosis not present

## 2018-01-21 DIAGNOSIS — M25512 Pain in left shoulder: Secondary | ICD-10-CM

## 2018-01-21 DIAGNOSIS — R29898 Other symptoms and signs involving the musculoskeletal system: Secondary | ICD-10-CM

## 2018-01-21 NOTE — Therapy (Addendum)
[facility], [place], [address] Phone: [phone]   Fax:  [phone]  Occupational Therapy Treatment  Patient Details  Name: [name] MRN: [id number] Date of Birth: 1966-04-19 Referring Provider: Dr. Victorino December   Encounter Date: 01/21/2018  OT End of Session - 01/21/18 1138    Visit Number  5    Number of Visits  24    Date for OT Re-Evaluation  04/05/18 mini reassess: 02/02/18    Authorization Type  Workers compensation approved 16 visits for her shoulder. If needing more, we will need to request them.     Authorization - Visit Number  5    Authorization - Number of Visits  16    OT Start Time  1113    OT Stop Time  1158    OT Time Calculation (min)  45 min    Activity Tolerance  Patient tolerated treatment well    Behavior During Therapy  WFL for tasks assessed/performed       Past Medical History:  Diagnosis Date  . Arthritis   . Asthma   . Chest pain    cath 2005 norm cors, repeat cath Feb 2011 normal cors and only minimally  elevated pulmonary pressures  . Crohn disease ([facility])   . Diverticulosis   . GERD (gastroesophageal reflux disease)   . Hiatal hernia   . Hypertension   . Obesity   . Palpitations   . Sleep apnea sleep study 11/03/2009    cpap; does sometimes, doesnt use every night.    Past Surgical History:  Procedure Laterality Date  . ABDOMINAL HYSTERECTOMY    . ABDOMINAL HYSTERECTOMY    . ANKLE ARTHROSCOPY  06/01/2012   Procedure: ANKLE ARTHROSCOPY;  Surgeon: Colin Rhein, MD;  Location: [facility];  Service: Orthopedics;  Laterality: Left;  left ankle arthorsocpy with extensive debridement and gastroc slide  . CARDIAC CATHETERIZATION  11/22/2009 and 2005   WNL  . CESAREAN SECTION    . CHOLECYSTECTOMY  09/08/2006   lap. chole.  . CHONDROPLASTY  06/17/2012   Procedure: CHONDROPLASTY;  Surgeon: Carole Civil, MD;  [facility];  Service: Orthopedics;   Laterality: Right;  right patella  . ESOPHAGOGASTRODUODENOSCOPY N/A 05/14/2015   Procedure: ESOPHAGOGASTRODUODENOSCOPY (EGD);  Surgeon: Rogene Houston, MD;  [facility];  Service: Endoscopy;  Laterality: N/A;  730  . INGUINAL HERNIA REPAIR  10/30/2008   right  . KNEE ARTHROSCOPY WITH LATERAL MENISECTOMY Left 12/23/2016   Procedure: LEFT KNEE ARTHROSCOPY WITH LATERAL MENISECTOMY;  Surgeon: Carole Civil, MD;  Location[facility];  Service: Orthopedics;  Laterality: Left;  . KNEE ARTHROSCOPY WITH MEDIAL MENISECTOMY Left 12/23/2016   Procedure: LEFT KNEE ARTHROSCOPY WITH MEDIAL MENISECTOMY CHONDROPLASTY PATELLA  AND MEDIAL FEMORAL CONDYLE LEFT KNEE;  Surgeon: Carole Civil, MD;  Location[facility];  Service: Orthopedics;  Laterality: Left;  . SHOULDER ARTHROSCOPY WITH SUBACROMIAL DECOMPRESSION AND BICEP TENDON REPAIR Left 12/21/2017   Procedure: Left shoulder arthroscopic biceps tenodesis, SAD, DCR and labrum debridement;  Surgeon: Nicholes Stairs, MD;  Location: [facility];  Service: Orthopedics;  Laterality: Left;  120 mins  . SHOULDER SURGERY     right x 2     There were no vitals filed for this visit.  Subjective Assessment - 01/21/18 1114    Subjective   S: I had to break down and take a pain pill.     Currently in Pain?  Yes    Pain Score  7     Pain Location  Shoulder    Pain Orientation  Left    Pain Descriptors / Indicators  Aching;Sore    Pain Type  Acute pain    Pain Radiating Towards  elbow and hand    Pain Onset  More than a month ago    Pain Frequency  Constant    Aggravating Factors   arm movement    Pain Relieving Factors  ice, pain medication    Effect of Pain on Daily Activities  mod effect on ADLs    Multiple Pain Sites  No         [facility] OT Assessment - 01/21/18 1114      Assessment   Medical Diagnosis  Left shoulder SLAP, debridement, and bicep tenodesis      Precautions   Precautions  Shoulder    Type of Shoulder Precautions  Follow Standard  SLAP repair protocol.     Shoulder Interventions  Shoulder sling/immobilizer               OT Treatments/Exercises (OP) - 01/21/18 1116      Exercises   Exercises  Shoulder      Shoulder Exercises: Supine   Protraction  PROM;AAROM;10 reps    External Rotation  PROM;AAROM;10 reps to 30 degrees    Internal Rotation  PROM;10 reps    Flexion  PROM;AAROM;10 reps to 90 degrees    ABduction  PROM;AAROM;10 reps to 90 degrees      Shoulder Exercises: Seated   Extension  AROM;10 reps    Row  AROM;10 reps    Other Seated Exercises  shoulder depression, A/ROM, 10X      Shoulder Exercises: Pulleys   Flexion  1 minute    Flexion Limitations  to 90 degrees      Shoulder Exercises: Therapy Ball   Flexion  10 reps    Flexion Limitations  to 90    ABduction  10 reps    ABduction Limitations  to 90       Shoulder Exercises: Isometric Strengthening   External Rotation  Supine;3X5"    Internal Rotation  Supine;3X5"      Manual Therapy   Manual Therapy  Myofascial release    Manual therapy comments  completed seperately from all other skilled interventions    Myofascial Release  myofascial release to left shoulder, scapular, and upper arm region to decrease pain and improve pain free mobility.                OT Short Term Goals - 01/12/18 1423      OT SHORT TERM GOAL #1   Title  Patient will be educated and independent with HEP for left shoulder mobility for improved abiilty to return to normal work and daily tasks.     Time  6    Period  Weeks    Status  On-going      OT SHORT TERM GOAL #2   Title  patient will improve Left shoulder P/ROM to WNL to increase ability to complete dressing tasks with less difficulty.     Time  6    Period  Weeks    Status  On-going      OT SHORT TERM GOAL #3   Title  Patient will increase left shoulder strength to 3+/5 in order to complete lightweight household actiivities with LUE.     Time  6    Period  Weeks  Status  On-going       OT SHORT TERM GOAL #4   Title  Patient will decrease pain level in left shoulder to 5/10 with daily task completion.     Time  6    Period  Weeks    Status  On-going      OT SHORT TERM GOAL #5   Title  Patient will decrease fascial restrictions in LUE to mod amount in order to increase functional mobility needed to complete overhead and reaching tasks.     Time  6    Period  Weeks    Status  On-going        OT Long Term Goals - 01/12/18 1424      OT LONG TERM GOAL #1   Title  Patient will return to highest level of independence with her LUE in order to return to work and complete required tasks.     Time  12    Period  Weeks    Status  On-going      OT LONG TERM GOAL #2   Title  patient will increase A/ROM of LUE to WNL to increase ability to complete over head reaching tasks without difficulty.     Time  12    Period  Weeks    Status  On-going      OT LONG TERM GOAL #3   Title  Patient will increase LUE strength to 4+/5 to increase ability to complete chest compressions during CPR when needed.     Time  12    Period  Weeks    Status  On-going      OT LONG TERM GOAL #4   Title  Patient will decrease fascial restrictions to min amount or less in order to increase functional mobility needed for overhead and reaching tasks.     Time  12    Period  Weeks    Status  On-going      OT LONG TERM GOAL #5   Title  Patient will report a decreased level of pain of approximately 2/10 when completing work or daily tasks.     Time  12    Period  Weeks    Status  On-going            Plan - 01/21/18 1138    Clinical Impression Statement  A: Pt reporting increased soreness since last session, notes she has not used ice in the last few days. Continued with myofascial release to deltoid and trapezius regions to decrease pain and improve mobility of LUE. Resumed therapy ball stretches this session, verbal cuing for form, technique, and stopping point at allowed range.      Plan  P: continue to follow protocol focusing on decreasing pain, increase isometrics to 5x5"       Patient will benefit from skilled therapeutic intervention in order to improve the following deficits and impairments:  Pain, Decreased range of motion, Impaired UE functional use, Increased fascial restrictions, Decreased strength, Impaired flexibility  Visit Diagnosis: Other symptoms and signs involving the musculoskeletal system  Acute pain of left shoulder    Problem List Patient Active Problem List   Diagnosis Date Noted  . Derangement of anterior horn of lateral meniscus of left knee   . Derangement of posterior horn of medial meniscus of left knee   . Chest pain 04/25/2016  . Hypokalemia 04/25/2016  . Hyperglycemia 04/25/2016  . Diverticulitis of colon 07/17/2014  . Nausea without vomiting 07/17/2014  .  Crohn disease ([facility]) 07/17/2014  . Diverticulitis of colon without hemorrhage 08/10/2013  . History of arthroscopy of right knee 07/25/2012  . Difficulty in walking(719.7) 06/29/2012  . Weakness of right leg 06/29/2012  . Posterior tibial tendonitis 11/11/2011  . PTTD (posterior tibial tendon dysfunction) 11/11/2011  . Knee pain, right 11/11/2011  . Chest pain 07/16/2011  . Primary osteoarthritis of left knee 02/25/2010  . PAIN IN JOINT, MULTIPLE SITES 02/25/2010  . Essential hypertension 08/13/2009  . HIATAL HERNIA 08/13/2009  . PALPITATIONS 08/13/2009  . DYSPNEA 08/13/2009   Guadelupe Sabin, OTR/L  [phone] 01/21/2018, 12:03 PM  [facility] [address] [place], [place], [address] Phone: [phone]   Fax:  [phone]  Name: [name] MRN: [id number] Date of Birth: 12/16/1965

## 2018-01-24 ENCOUNTER — Ambulatory Visit (HOSPITAL_COMMUNITY): Payer: PRIVATE HEALTH INSURANCE

## 2018-01-24 ENCOUNTER — Encounter (HOSPITAL_COMMUNITY): Payer: Self-pay

## 2018-01-24 ENCOUNTER — Other Ambulatory Visit: Payer: Self-pay

## 2018-01-24 DIAGNOSIS — M25612 Stiffness of left shoulder, not elsewhere classified: Secondary | ICD-10-CM

## 2018-01-24 DIAGNOSIS — R29898 Other symptoms and signs involving the musculoskeletal system: Secondary | ICD-10-CM

## 2018-01-24 DIAGNOSIS — M25512 Pain in left shoulder: Secondary | ICD-10-CM

## 2018-01-24 NOTE — Therapy (Addendum)
[place] [facility], [place], [address] Phone: [phone]   Fax:  [phone]  Occupational Therapy Treatment  Patient Details  Name: LUMEN BRINLEE MRN: [id number] Date of Birth: [birth year] Referring Provider: Dr. Victorino December   Encounter Date: 01/24/2018  OT End of Session - 01/24/18 1419    Visit Number  6    Number of Visits  24    Date for OT Re-Evaluation  04/05/18 mini reassess: 02/02/18    Authorization Type  Workers compensation approved 16 visits for her shoulder. If needing more, we will need to request them.     Authorization - Visit Number  6   Authorization - Number of Visits  16    OT Start Time  1350    OT Stop Time  1430    OT Time Calculation (min)  40 min    Activity Tolerance  Patient tolerated treatment well    Behavior During Therapy  WFL for tasks assessed/performed       Past Medical History:  Diagnosis Date  . Arthritis   . Asthma   . Chest pain    cath 2005 norm cors, repeat cath Feb 2011 normal cors and only minimally  elevated pulmonary pressures  . Crohn disease ([facility])   . Diverticulosis   . GERD (gastroesophageal reflux disease)   . Hiatal hernia   . Hypertension   . Obesity   . Palpitations   . Sleep apnea sleep study 11/03/2009    cpap; does sometimes, doesnt use every night.    Past Surgical History:  Procedure Laterality Date  . ABDOMINAL HYSTERECTOMY    . ABDOMINAL HYSTERECTOMY    . ANKLE ARTHROSCOPY  06/01/2012   Procedure: ANKLE ARTHROSCOPY;  Surgeon: Colin Rhein, MD;  Location: [facility];  Service: Orthopedics;  Laterality: Left;  left ankle arthorsocpy with extensive debridement and gastroc slide  . CARDIAC CATHETERIZATION  11/22/2009 and 2005   WNL  . CESAREAN SECTION    . CHOLECYSTECTOMY  09/08/2006   lap. chole.  . CHONDROPLASTY  06/17/2012   Procedure: CHONDROPLASTY;  Surgeon: Carole Civil, MD;  [facility];  Service: Orthopedics;   Laterality: Right;  right patella  . ESOPHAGOGASTRODUODENOSCOPY N/A 05/14/2015   Procedure: ESOPHAGOGASTRODUODENOSCOPY (EGD);  Surgeon: Rogene Houston, MD;  [facility];  Service: Endoscopy;  Laterality: N/A;  730  . INGUINAL HERNIA REPAIR  10/30/2008   right  . KNEE ARTHROSCOPY WITH LATERAL MENISECTOMY Left 12/23/2016   Procedure: LEFT KNEE ARTHROSCOPY WITH LATERAL MENISECTOMY;  Surgeon: Carole Civil, MD;  [facility];  Service: Orthopedics;  Laterality: Left;  . KNEE ARTHROSCOPY WITH MEDIAL MENISECTOMY Left 12/23/2016   Procedure: LEFT KNEE ARTHROSCOPY WITH MEDIAL MENISECTOMY CHONDROPLASTY PATELLA  AND MEDIAL FEMORAL CONDYLE LEFT KNEE;  Surgeon: Carole Civil, MD;  Location[facility];  Service: Orthopedics;  Laterality: Left;  . SHOULDER ARTHROSCOPY WITH SUBACROMIAL DECOMPRESSION AND BICEP TENDON REPAIR Left 12/21/2017   Procedure: Left shoulder arthroscopic biceps tenodesis, SAD, DCR and labrum debridement;  Surgeon: Nicholes Stairs, MD;  Location: [facility];  Service: Orthopedics;  Laterality: Left;  120 mins  . SHOULDER SURGERY     right x 2     There were no vitals filed for this visit.  Subjective Assessment - 01/24/18 1417    Subjective   S: It's sore and I feel pressure during the stretching.     Currently in Pain?  Yes  Pain Score  5     Pain Location  Shoulder    Pain Orientation  Left    Pain Descriptors / Indicators  Aching;Sore    Pain Type  Acute pain         [facility] OT Assessment - 01/24/18 1417      Assessment   Medical Diagnosis  Left shoulder SLAP, debridement, and bicep tenodesis      Precautions   Precautions  Shoulder    Type of Shoulder Precautions  Follow Standard SLAP repair protocol.     Shoulder Interventions  Shoulder sling/immobilizer               OT Treatments/Exercises (OP) - 01/24/18 1417      Exercises   Exercises  Shoulder      Shoulder Exercises: Supine   Protraction  PROM;AAROM;10 reps    Horizontal  ABduction  PROM;AAROM;10 reps    External Rotation  PROM;AAROM;10 reps to 30    Internal Rotation  PROM;10 reps    Flexion  PROM;AAROM;10 reps to tolerance    ABduction  PROM;AAROM;10 reps to 90      Shoulder Exercises: Pulleys   Flexion  1 minute    Flexion Limitations  to tolerance      Manual Therapy   Manual Therapy  Myofascial release    Manual therapy comments  completed seperately from all other skilled interventions    Myofascial Release  myofascial release to left shoulder, scapular, and upper arm region to decrease pain and improve pain free mobility.                OT Short Term Goals - 01/12/18 1423      OT SHORT TERM GOAL #1   Title  Patient will be educated and independent with HEP for left shoulder mobility for improved abiilty to return to normal work and daily tasks.     Time  6    Period  Weeks    Status  On-going      OT SHORT TERM GOAL #2   Title  patient will improve Left shoulder P/ROM to WNL to increase ability to complete dressing tasks with less difficulty.     Time  6    Period  Weeks    Status  On-going      OT SHORT TERM GOAL #3   Title  Patient will increase left shoulder strength to 3+/5 in order to complete lightweight household actiivities with LUE.     Time  6    Period  Weeks    Status  On-going      OT SHORT TERM GOAL #4   Title  Patient will decrease pain level in left shoulder to 5/10 with daily task completion.     Time  6    Period  Weeks    Status  On-going      OT SHORT TERM GOAL #5   Title  Patient will decrease fascial restrictions in LUE to mod amount in order to increase functional mobility needed to complete overhead and reaching tasks.     Time  6    Period  Weeks    Status  On-going        OT Long Term Goals - 01/12/18 1424      OT LONG TERM GOAL #1   Title  Patient will return to highest level of independence with her LUE in order to return to work and complete required tasks.  Time  12    Period   Weeks    Status  On-going      OT LONG TERM GOAL #2   Title  patient will increase A/ROM of LUE to WNL to increase ability to complete over head reaching tasks without difficulty.     Time  12    Period  Weeks    Status  On-going      OT LONG TERM GOAL #3   Title  Patient will increase LUE strength to 4+/5 to increase ability to complete chest compressions during CPR when needed.     Time  12    Period  Weeks    Status  On-going      OT LONG TERM GOAL #4   Title  Patient will decrease fascial restrictions to min amount or less in order to increase functional mobility needed for overhead and reaching tasks.     Time  12    Period  Weeks    Status  On-going      OT LONG TERM GOAL #5   Title  Patient will report a decreased level of pain of approximately 2/10 when completing work or daily tasks.     Time  12    Period  Weeks    Status  On-going            Plan - 01/24/18 1420    Clinical Impression Statement  A: Increased fascial restrictions and pain in LUE. Manual therapy completed to address. Flexion allowed to increase to tolerance. patient was able to tolerate slightly more than 90 degrees. VC for form and technique.     Plan  P: Continue to follow protocol. Focus on increasing flexion ROM tolereance and decreasing pain level. Continue with manual therapy to address fascial restrictions. Take measurements for MD appointment on Thursday.       Patient will benefit from skilled therapeutic intervention in order to improve the following deficits and impairments:  Pain, Decreased range of motion, Impaired UE functional use, Increased fascial restrictions, Decreased strength, Impaired flexibility  Visit Diagnosis: Other symptoms and signs involving the musculoskeletal system  Acute pain of left shoulder  Stiffness of left shoulder, not elsewhere classified    Problem List Patient Active Problem List   Diagnosis Date Noted  . Derangement of anterior horn of lateral  meniscus of left knee   . Derangement of posterior horn of medial meniscus of left knee   . Chest pain 04/25/2016  . Hypokalemia 04/25/2016  . Hyperglycemia 04/25/2016  . Diverticulitis of colon 07/17/2014  . Nausea without vomiting 07/17/2014  . Crohn disease ([place]) 07/17/2014  . Diverticulitis of colon without hemorrhage 08/10/2013  . History of arthroscopy of right knee 07/25/2012  . Difficulty in walking(719.7) 06/29/2012  . Weakness of right leg 06/29/2012  . Posterior tibial tendonitis 11/11/2011  . PTTD (posterior tibial tendon dysfunction) 11/11/2011  . Knee pain, right 11/11/2011  . Chest pain 07/16/2011  . Primary osteoarthritis of left knee 02/25/2010  . PAIN IN JOINT, MULTIPLE SITES 02/25/2010  . Essential hypertension 08/13/2009  . HIATAL HERNIA 08/13/2009  . PALPITATIONS 08/13/2009  . DYSPNEA 08/13/2009   Ailene Ravel, OTR/L,CBIS  [phone]  01/24/2018, 2:32 PM  [facility] [address] [place], [place], [address] Phone: [phone]   Fax:  [phone]  Name: BERENICE OEHLERT MRN: [id number] Date of Birth: August 22, 1966

## 2018-01-26 ENCOUNTER — Ambulatory Visit (HOSPITAL_COMMUNITY): Payer: PRIVATE HEALTH INSURANCE | Admitting: Occupational Therapy

## 2018-01-26 ENCOUNTER — Encounter (HOSPITAL_COMMUNITY): Payer: Self-pay | Admitting: Occupational Therapy

## 2018-01-26 DIAGNOSIS — M25612 Stiffness of left shoulder, not elsewhere classified: Secondary | ICD-10-CM | POA: Diagnosis not present

## 2018-01-26 DIAGNOSIS — M25512 Pain in left shoulder: Secondary | ICD-10-CM

## 2018-01-26 DIAGNOSIS — R29898 Other symptoms and signs involving the musculoskeletal system: Secondary | ICD-10-CM | POA: Diagnosis not present

## 2018-01-26 NOTE — Therapy (Addendum)
[facility], [place], [address] Phone: [phone]   Fax:  [phone]  Occupational Therapy Treatment  Patient Details  Name: [name] MRN: [id number] Date of Birth: [birth year] Referring Provider: Dr. Victorino December   Encounter Date: 01/26/2018   Progress Note Reporting Period 01/05/18 to 01/26/18  See note below for Objective Data and Assessment of Progress/Goals.       OT End of Session - 01/26/18 1331    Visit Number  7    Number of Visits  24    Date for OT Re-Evaluation  04/05/18 mini reassess: 02/02/18    Authorization Type  Workers compensation approved 16 visits for her shoulder. If needing more, we will need to request them.     Authorization - Visit Number  7    Authorization - Number of Visits  16    OT Start Time  1302    OT Stop Time  1343    OT Time Calculation (min)  41 min    Activity Tolerance  Patient tolerated treatment well    Behavior During Therapy  WFL for tasks assessed/performed       Past Medical History:  Diagnosis Date  . Arthritis   . Asthma   . Chest pain    cath 2005 norm cors, repeat cath Feb 2011 normal cors and only minimally  elevated pulmonary pressures  . Crohn disease ([place])   . Diverticulosis   . GERD (gastroesophageal reflux disease)   . Hiatal hernia   . Hypertension   . Obesity   . Palpitations   . Sleep apnea sleep study 11/03/2009    cpap; does sometimes, doesnt use every night.    Past Surgical History:  Procedure Laterality Date  . ABDOMINAL HYSTERECTOMY    . ABDOMINAL HYSTERECTOMY    . ANKLE ARTHROSCOPY  06/01/2012   Procedure: ANKLE ARTHROSCOPY;  Surgeon: Colin Rhein, MD;  Location: [facility];  Service: Orthopedics;  Laterality: Left;  left ankle arthorsocpy with extensive debridement and gastroc slide  . CARDIAC CATHETERIZATION  11/22/2009 and 2005   WNL  . CESAREAN SECTION    . CHOLECYSTECTOMY  09/08/2006   lap. chole.  .  CHONDROPLASTY  06/17/2012   Procedure: CHONDROPLASTY;  Surgeon: Carole Civil, MD;  [facility];  Service: Orthopedics;  Laterality: Right;  right patella  . ESOPHAGOGASTRODUODENOSCOPY N/A 05/14/2015   Procedure: ESOPHAGOGASTRODUODENOSCOPY (EGD);  Surgeon: Rogene Houston, MD;  [facility];  Service: Endoscopy;  Laterality: N/A;  730  . INGUINAL HERNIA REPAIR  10/30/2008   right  . KNEE ARTHROSCOPY WITH LATERAL MENISECTOMY Left 12/23/2016   Procedure: LEFT KNEE ARTHROSCOPY WITH LATERAL MENISECTOMY;  Surgeon: Carole Civil, MD;  [facility]: [facility];  Service: Orthopedics;  Laterality: Left;  . KNEE ARTHROSCOPY WITH MEDIAL MENISECTOMY Left 12/23/2016   Procedure: LEFT KNEE ARTHROSCOPY WITH MEDIAL MENISECTOMY CHONDROPLASTY PATELLA  AND MEDIAL FEMORAL CONDYLE LEFT KNEE;  Surgeon: Carole Civil, MD;  [facility];  Service: Orthopedics;  Laterality: Left;  . SHOULDER ARTHROSCOPY WITH SUBACROMIAL DECOMPRESSION AND BICEP TENDON REPAIR Left 12/21/2017   Procedure: Left shoulder arthroscopic biceps tenodesis, SAD, DCR and labrum debridement;  Surgeon: Nicholes Stairs, MD;  Location: [facility];  Service: Orthopedics;  Laterality: Left;  120 mins  . SHOULDER SURGERY     right x 2     There were no vitals filed for this visit.  Subjective Assessment - 01/26/18 1304  Subjective   S: My bicep feels tight around my elbow.     Currently in Pain?  Yes    Pain Score  5     Pain Location  Shoulder    Pain Orientation  Left    Pain Descriptors / Indicators  Aching;Sore    Pain Type  Acute pain    Pain Radiating Towards  elbow     Pain Onset  More than a month ago    Pain Frequency  Constant    Aggravating Factors   arm movement    Pain Relieving Factors  ice, pain medication    Effect of Pain on Daily Activities  mod effect on ADLs    Multiple Pain Sites  No         [facility] OT Assessment - 01/26/18 1303      Assessment   Medical Diagnosis  Left shoulder SLAP,  debridement, and bicep tenodesis      Precautions   Precautions  Shoulder    Type of Shoulder Precautions  Follow Standard SLAP repair protocol.     Shoulder Interventions  Shoulder sling/immobilizer      PROM   Overall PROM Comments  Assessed supine. IR/er adducted    PROM Assessment Site  Shoulder    Right/Left Shoulder  Left    Left Shoulder Flexion  138 Degrees 95 previous    Left Shoulder ABduction  90 Degrees same as previous-per protocol    Left Shoulder Internal Rotation  90 Degrees same as previous    Left Shoulder External Rotation  30 Degrees same as previous-per protocol               OT Treatments/Exercises (OP) - 01/26/18 1304      Exercises   Exercises  Shoulder      Shoulder Exercises: Supine   Protraction  PROM;AAROM;10 reps    Horizontal ABduction  PROM;AAROM;10 reps    External Rotation  PROM;AAROM;10 reps to 30     Internal Rotation  PROM;10 reps    Flexion  PROM;AAROM;10 reps    ABduction  PROM;AAROM;10 reps to 90      Shoulder Exercises: Pulleys   Flexion  1 minute    Flexion Limitations  to tolerance      Manual Therapy   Manual Therapy  Myofascial release    Manual therapy comments  completed seperately from all other skilled interventions    Myofascial Release  myofascial release to left shoulder, scapular, and upper arm region to decrease pain and improve pain free mobility.                OT Short Term Goals - 01/12/18 1423      OT SHORT TERM GOAL #1   Title  Patient will be educated and independent with HEP for left shoulder mobility for improved abiilty to return to normal work and daily tasks.     Time  6    Period  Weeks    Status  On-going      OT SHORT TERM GOAL #2   Title  patient will improve Left shoulder P/ROM to WNL to increase ability to complete dressing tasks with less difficulty.     Time  6    Period  Weeks    Status  On-going      OT SHORT TERM GOAL #3   Title  Patient will increase left shoulder  strength to 3+/5 in order to complete lightweight household actiivities with LUE.  Time  6    Period  Weeks    Status  On-going      OT SHORT TERM GOAL #4   Title  Patient will decrease pain level in left shoulder to 5/10 with daily task completion.     Time  6    Period  Weeks    Status  On-going      OT SHORT TERM GOAL #5   Title  Patient will decrease fascial restrictions in LUE to mod amount in order to increase functional mobility needed to complete overhead and reaching tasks.     Time  6    Period  Weeks    Status  On-going        OT Long Term Goals - 01/12/18 1424      OT LONG TERM GOAL #1   Title  Patient will return to highest level of independence with her LUE in order to return to work and complete required tasks.     Time  12    Period  Weeks    Status  On-going      OT LONG TERM GOAL #2   Title  patient will increase A/ROM of LUE to WNL to increase ability to complete over head reaching tasks without difficulty.     Time  12    Period  Weeks    Status  On-going      OT LONG TERM GOAL #3   Title  Patient will increase LUE strength to 4+/5 to increase ability to complete chest compressions during CPR when needed.     Time  12    Period  Weeks    Status  On-going      OT LONG TERM GOAL #4   Title  Patient will decrease fascial restrictions to min amount or less in order to increase functional mobility needed for overhead and reaching tasks.     Time  12    Period  Weeks    Status  On-going      OT LONG TERM GOAL #5   Title  Patient will report a decreased level of pain of approximately 2/10 when completing work or daily tasks.     Time  12    Period  Weeks    Status  On-going            Plan - 01/26/18 1331    Clinical Impression Statement  A: Measurements taken for MD appt today, continued with protocol increasing flexion during passive stretching to pt's tolerance. Pt is making slow, steady progress towards goals while maintaining protocol  limitations. She is able to manage pain for sleeping and completed her HEP daily. Continued with manual therapy including bicep as pt with increased tightness in this area, responded well to manual techniques. Verbal cuing for form and technique during session.     Plan  P: Follow up on MD appt, continue to follow protocol increasing ROM during flexion to pt's tolerance       Patient will benefit from skilled therapeutic intervention in order to improve the following deficits and impairments:  Pain, Decreased range of motion, Impaired UE functional use, Increased fascial restrictions, Decreased strength, Impaired flexibility  Visit Diagnosis: Other symptoms and signs involving the musculoskeletal system  Acute pain of left shoulder  Stiffness of left shoulder, not elsewhere classified    Problem List Patient Active Problem List   Diagnosis Date Noted  . Derangement of anterior horn of lateral meniscus of left knee   .  Derangement of posterior horn of medial meniscus of left knee   . Chest pain 04/25/2016  . Hypokalemia 04/25/2016  . Hyperglycemia 04/25/2016  . Diverticulitis of colon 07/17/2014  . Nausea without vomiting 07/17/2014  . Crohn disease ([place]) 07/17/2014  . Diverticulitis of colon without hemorrhage 08/10/2013  . History of arthroscopy of right knee 07/25/2012  . Difficulty in walking(719.7) 06/29/2012  . Weakness of right leg 06/29/2012  . Posterior tibial tendonitis 11/11/2011  . PTTD (posterior tibial tendon dysfunction) 11/11/2011  . Knee pain, right 11/11/2011  . Chest pain 07/16/2011  . Primary osteoarthritis of left knee 02/25/2010  . PAIN IN JOINT, MULTIPLE SITES 02/25/2010  . Essential hypertension 08/13/2009  . HIATAL HERNIA 08/13/2009  . PALPITATIONS 08/13/2009  . DYSPNEA 08/13/2009   Guadelupe Sabin, OTR/L  [phone] 01/26/2018, 2:14 PM  [facility] [address] [place], [place], [address] Phone:  [phone]   Fax:  [phone]  Name: [name] MRN: [id number] Date of Birth: [birth year]

## 2018-01-27 MED FILL — oxyCODONE HCL 5 MG TABS: 5 | 5 days supply | Qty: 30 | Fill #0

## 2018-01-27 MED FILL — CYCLOBENZAPRINE 10 MG TAB: 10 | 15 days supply | Qty: 45 | Fill #0

## 2018-01-27 MED FILL — MONTELUKAST SOD 10 MG TAB: 10 | 30 days supply | Qty: 30 | Fill #0 | Status: TO

## 2018-01-28 ENCOUNTER — Other Ambulatory Visit (HOSPITAL_COMMUNITY): Payer: Self-pay | Admitting: Orthopedic Surgery

## 2018-01-28 DIAGNOSIS — S93402A Sprain of unspecified ligament of left ankle, initial encounter: Secondary | ICD-10-CM

## 2018-02-01 ENCOUNTER — Ambulatory Visit (HOSPITAL_COMMUNITY): Payer: PRIVATE HEALTH INSURANCE | Admitting: Occupational Therapy

## 2018-02-01 ENCOUNTER — Encounter (HOSPITAL_COMMUNITY): Payer: Self-pay | Admitting: Occupational Therapy

## 2018-02-01 ENCOUNTER — Ambulatory Visit (HOSPITAL_COMMUNITY)
Admission: RE | Admit: 2018-02-01 | Discharge: 2018-02-01 | Disposition: A | Discharge status: Home / Self Care | Payer: PRIVATE HEALTH INSURANCE | Source: Ambulatory Visit | Attending: Orthopedic Surgery | Admitting: Orthopedic Surgery

## 2018-02-01 DIAGNOSIS — M25572 Pain in left ankle and joints of left foot: Secondary | ICD-10-CM | POA: Diagnosis present

## 2018-02-01 DIAGNOSIS — R29898 Other symptoms and signs involving the musculoskeletal system: Secondary | ICD-10-CM | POA: Diagnosis not present

## 2018-02-01 DIAGNOSIS — M25512 Pain in left shoulder: Secondary | ICD-10-CM | POA: Diagnosis not present

## 2018-02-01 DIAGNOSIS — M25612 Stiffness of left shoulder, not elsewhere classified: Secondary | ICD-10-CM

## 2018-02-01 DIAGNOSIS — S93402A Sprain of unspecified ligament of left ankle, initial encounter: Secondary | ICD-10-CM

## 2018-02-01 NOTE — Patient Instructions (Signed)
Perform each exercise ____10-15____ reps. 2-3x days.  Complete while lying on back and while standing  Protraction  Start by holding a wand or cane at chest height.  Next, slowly push the wand outwards in front of your body so that your elbows become fully straightened. Then, return to the original position.     Shoulder FLEXION  In the standing position, hold a wand/cane with both arms, palms up on both sides. Raise up the wand/cane allowing your unaffected arm to perform most of the effort. Your affected arm should be partially relaxed.      Internal/External ROTATION   In the standing position, hold a wand/cane with both hands keeping your elbows bent. Move your arms and wand/cane to one side.  Your affected arm should be partially relaxed while your unaffected arm performs most of the effort.       Shoulder ABDUCTION  While holding a wand/cane palm face up on the injured side and palm face down on the uninjured side, slowly raise up your injured arm to the side.                     Horizontal Abduction/Adduction      Straight arms holding cane at shoulder height, bring cane to right, center, left. Repeat starting to left.   Copyright  VHI. All rights reserved.

## 2018-02-01 NOTE — Therapy (Addendum)
[facility] [address] [place], [place], [address] Phone: [phone]   Fax:  [phone]  Occupational Therapy Treatment  Patient Details  Name: [name] MRN: [id number] Date of Birth: [birth year] Referring Provider: Dr. Victorino December   Encounter Date: 02/01/2018  OT End of Session - 02/01/18 1347    Visit Number  8    Number of Visits  24    Date for OT Re-Evaluation  04/05/18    Authorization Type  Workers compensation approved 16 visits for her shoulder. If needing more, we will need to request them.     Authorization - Visit Number  8    Authorization - Number of Visits  16    OT Start Time  1304    OT Stop Time  1346    OT Time Calculation (min)  42 min    Activity Tolerance  Patient tolerated treatment well    Behavior During Therapy  WFL for tasks assessed/performed       Past Medical History:  Diagnosis Date  . Arthritis   . Asthma   . Chest pain    cath 2005 norm cors, repeat cath Feb 2011 normal cors and only minimally  elevated pulmonary pressures  . Crohn disease ([place])   . Diverticulosis   . GERD (gastroesophageal reflux disease)   . Hiatal hernia   . Hypertension   . Obesity   . Palpitations   . Sleep apnea sleep study 11/03/2009    cpap; does sometimes, doesnt use every night.    Past Surgical History:  Procedure Laterality Date  . ABDOMINAL HYSTERECTOMY    . ABDOMINAL HYSTERECTOMY    . ANKLE ARTHROSCOPY  06/01/2012   Procedure: ANKLE ARTHROSCOPY;  Surgeon: Colin Rhein, MD;  Location: [facility];  Service: Orthopedics;  Laterality: Left;  left ankle arthorsocpy with extensive debridement and gastroc slide  . CARDIAC CATHETERIZATION  11/22/2009 and 2005   WNL  . CESAREAN SECTION    . CHOLECYSTECTOMY  09/08/2006   lap. chole.  . CHONDROPLASTY  06/17/2012   Procedure: CHONDROPLASTY;  Surgeon: Carole Civil, MD;  [facility];  Service: Orthopedics;  Laterality: Right;  right  patella  . ESOPHAGOGASTRODUODENOSCOPY N/A 05/14/2015   Procedure: ESOPHAGOGASTRODUODENOSCOPY (EGD);  Surgeon: Rogene Houston, MD;  [facility];  Service: Endoscopy;  Laterality: N/A;  730  . INGUINAL HERNIA REPAIR  10/30/2008   right  . KNEE ARTHROSCOPY WITH LATERAL MENISECTOMY Left 12/23/2016   Procedure: LEFT KNEE ARTHROSCOPY WITH LATERAL MENISECTOMY;  Surgeon: Carole Civil, MD;  [facility];  Service: Orthopedics;  Laterality: Left;  . KNEE ARTHROSCOPY WITH MEDIAL MENISECTOMY Left 12/23/2016   Procedure: LEFT KNEE ARTHROSCOPY WITH MEDIAL MENISECTOMY CHONDROPLASTY PATELLA  AND MEDIAL FEMORAL CONDYLE LEFT KNEE;  Surgeon: Carole Civil, MD;  [facility];  Service: Orthopedics;  Laterality: Left;  . SHOULDER ARTHROSCOPY WITH SUBACROMIAL DECOMPRESSION AND BICEP TENDON REPAIR Left 12/21/2017   Procedure: Left shoulder arthroscopic biceps tenodesis, SAD, DCR and labrum debridement;  Surgeon: Nicholes Stairs, MD;  Location: [facility];  Service: Orthopedics;  Laterality: Left;  120 mins  . SHOULDER SURGERY     right x 2     There were no vitals filed for this visit.  Subjective Assessment - 02/01/18 1304    Subjective   S: The doctor said I don't have to wear the sling anymore.     Currently in Pain?  Yes  Pain Score  4     Pain Location  Shoulder    Pain Orientation  Left    Pain Descriptors / Indicators  Aching;Sore    Pain Type  Acute pain    Pain Radiating Towards  elbow    Pain Onset  More than a month ago    Pain Frequency  Constant    Aggravating Factors   arm movement    Pain Relieving Factors  ice, pain medication    Effect of Pain on Daily Activities  mod effect on ADLs    Multiple Pain Sites  No         [facility] OT Assessment - 02/01/18 1304      Assessment   Medical Diagnosis  Left shoulder SLAP, debridement, and bicep tenodesis      Precautions   Precautions  Shoulder    Type of Shoulder Precautions  Follow Standard SLAP repair protocol.      Shoulder Interventions  Shoulder sling/immobilizer               OT Treatments/Exercises (OP) - 02/01/18 1306      Exercises   Exercises  Shoulder      Shoulder Exercises: Supine   Protraction  PROM;5 reps;AAROM;12 reps    Horizontal ABduction  PROM;5 reps;AAROM;12 reps    External Rotation  PROM;5 reps;AAROM;12 reps    Internal Rotation  PROM;5 reps;AAROM;12 reps    Flexion  PROM;5 reps;AAROM;12 reps    ABduction  PROM;5 reps;AAROM;12 reps      Shoulder Exercises: Standing   Protraction  AAROM;10 reps    Horizontal ABduction  AAROM;10 reps    External Rotation  AAROM;10 reps    Internal Rotation  AAROM;10 reps    Flexion  AAROM;10 reps    ABduction  AAROM;10 reps      Manual Therapy   Manual Therapy  Myofascial release    Manual therapy comments  completed seperately from all other skilled interventions    Myofascial Release  myofascial release to left shoulder, scapular, and upper arm region to decrease pain and improve pain free mobility.              OT Education - 02/01/18 1324    Education provided  Yes    Education Details  AA/ROM    Person(s) Educated  Patient    Methods  Explanation;Demonstration;Handout    Comprehension  Verbalized understanding;Returned demonstration       OT Short Term Goals - 01/12/18 1423      OT SHORT TERM GOAL #1   Title  Patient will be educated and independent with HEP for left shoulder mobility for improved abiilty to return to normal work and daily tasks.     Time  6    Period  Weeks    Status  On-going      OT SHORT TERM GOAL #2   Title  patient will improve Left shoulder P/ROM to WNL to increase ability to complete dressing tasks with less difficulty.     Time  6    Period  Weeks    Status  On-going      OT SHORT TERM GOAL #3   Title  Patient will increase left shoulder strength to 3+/5 in order to complete lightweight household actiivities with LUE.     Time  6    Period  Weeks    Status  On-going       OT SHORT TERM GOAL #4   Title  Patient will decrease pain level in left shoulder to 5/10 with daily task completion.     Time  6    Period  Weeks    Status  On-going      OT SHORT TERM GOAL #5   Title  Patient will decrease fascial restrictions in LUE to mod amount in order to increase functional mobility needed to complete overhead and reaching tasks.     Time  6    Period  Weeks    Status  On-going        OT Long Term Goals - 01/12/18 1424      OT LONG TERM GOAL #1   Title  Patient will return to highest level of independence with her LUE in order to return to work and complete required tasks.     Time  12    Period  Weeks    Status  On-going      OT LONG TERM GOAL #2   Title  patient will increase A/ROM of LUE to WNL to increase ability to complete over head reaching tasks without difficulty.     Time  12    Period  Weeks    Status  On-going      OT LONG TERM GOAL #3   Title  Patient will increase LUE strength to 4+/5 to increase ability to complete chest compressions during CPR when needed.     Time  12    Period  Weeks    Status  On-going      OT LONG TERM GOAL #4   Title  Patient will decrease fascial restrictions to min amount or less in order to increase functional mobility needed for overhead and reaching tasks.     Time  12    Period  Weeks    Status  On-going      OT LONG TERM GOAL #5   Title  Patient will report a decreased level of pain of approximately 2/10 when completing work or daily tasks.     Time  12    Period  Weeks    Status  On-going            Plan - 02/01/18 1348    Clinical Impression Statement  A: Pt reports MD is pleased with her progress, removed sling and instructed pt not to actively move her arm. Mini-reassessment not completed as pt was just measured last session for MD appt. Continued with manual therapy to address fascial restrictions and tightness along bicep and deltoid areas. Progressed to phase III of protocol adding  AA/ROM in sitting with slightly increased time for complete. Verbal cuing for form and technique during exercises.     Plan  P: Follow up on HEP, continue with phase III of protocol, add red theraband er/IR    Consulted and Agree with Plan of Care  Patient       Patient will benefit from skilled therapeutic intervention in order to improve the following deficits and impairments:  Pain, Decreased range of motion, Impaired UE functional use, Increased fascial restrictions, Decreased strength, Impaired flexibility  Visit Diagnosis: Other symptoms and signs involving the musculoskeletal system  Acute pain of left shoulder  Stiffness of left shoulder, not elsewhere classified    Problem List Patient Active Problem List   Diagnosis Date Noted  . Derangement of anterior horn of lateral meniscus of left knee   . Derangement of posterior horn of medial meniscus of left knee   . Chest  pain 04/25/2016  . Hypokalemia 04/25/2016  . Hyperglycemia 04/25/2016  . Diverticulitis of colon 07/17/2014  . Nausea without vomiting 07/17/2014  . Crohn disease ([place]) 07/17/2014  . Diverticulitis of colon without hemorrhage 08/10/2013  . History of arthroscopy of right knee 07/25/2012  . Difficulty in walking(719.7) 06/29/2012  . Weakness of right leg 06/29/2012  . Posterior tibial tendonitis 11/11/2011  . PTTD (posterior tibial tendon dysfunction) 11/11/2011  . Knee pain, right 11/11/2011  . Chest pain 07/16/2011  . Primary osteoarthritis of left knee 02/25/2010  . PAIN IN JOINT, MULTIPLE SITES 02/25/2010  . Essential hypertension 08/13/2009  . HIATAL HERNIA 08/13/2009  . PALPITATIONS 08/13/2009  . DYSPNEA 08/13/2009   Guadelupe Sabin, OTR/L  [phone] 02/01/2018, 1:54 PM  [facility] [address] [place], [place], [address] Phone: [phone]   Fax:  [phone]  Name: [name] MRN: [id number] Date of Birth: May 25, 1966

## 2018-02-03 ENCOUNTER — Ambulatory Visit (HOSPITAL_COMMUNITY): Payer: PRIVATE HEALTH INSURANCE | Attending: Orthopedic Surgery | Admitting: Occupational Therapy

## 2018-02-03 ENCOUNTER — Encounter (HOSPITAL_COMMUNITY): Payer: Self-pay | Admitting: Occupational Therapy

## 2018-02-03 DIAGNOSIS — M25512 Pain in left shoulder: Secondary | ICD-10-CM | POA: Diagnosis not present

## 2018-02-03 DIAGNOSIS — M6281 Muscle weakness (generalized): Secondary | ICD-10-CM | POA: Insufficient documentation

## 2018-02-03 DIAGNOSIS — M25612 Stiffness of left shoulder, not elsewhere classified: Secondary | ICD-10-CM | POA: Diagnosis not present

## 2018-02-03 DIAGNOSIS — R2689 Other abnormalities of gait and mobility: Secondary | ICD-10-CM | POA: Insufficient documentation

## 2018-02-03 DIAGNOSIS — R29898 Other symptoms and signs involving the musculoskeletal system: Secondary | ICD-10-CM | POA: Diagnosis not present

## 2018-02-03 DIAGNOSIS — M25572 Pain in left ankle and joints of left foot: Secondary | ICD-10-CM | POA: Insufficient documentation

## 2018-02-03 NOTE — Therapy (Addendum)
[facility] [address] [place], [place], [address] Phone: [phone]   Fax:  [phone]  Occupational Therapy Treatment  Patient Details  Name: [name] MRN: [id number] Date of Birth: 03/08/1966 Referring Provider: Dr. Victorino December   Encounter Date: 02/03/2018  OT End of Session - 02/03/18 1326    Visit Number  9    Number of Visits  24    Date for OT Re-Evaluation  04/05/18    Authorization Type  Workers compensation approved 16 visits for her shoulder. If needing more, we will need to request them.     Authorization - Visit Number  9    Authorization - Number of Visits  16    OT Start Time  1304    OT Stop Time  1346    OT Time Calculation (min)  42 min    Activity Tolerance  Patient tolerated treatment well    Behavior During Therapy  WFL for tasks assessed/performed       Past Medical History:  Diagnosis Date  . Arthritis   . Asthma   . Chest pain    cath 2005 norm cors, repeat cath Feb 2011 normal cors and only minimally  elevated pulmonary pressures  . Crohn disease ([place])   . Diverticulosis   . GERD (gastroesophageal reflux disease)   . Hiatal hernia   . Hypertension   . Obesity   . Palpitations   . Sleep apnea sleep study 11/03/2009    cpap; does sometimes, doesnt use every night.    Past Surgical History:  Procedure Laterality Date  . ABDOMINAL HYSTERECTOMY    . ABDOMINAL HYSTERECTOMY    . ANKLE ARTHROSCOPY  06/01/2012   Procedure: ANKLE ARTHROSCOPY;  Surgeon: Colin Rhein, MD;  Location: [facility];  Service: Orthopedics;  Laterality: Left;  left ankle arthorsocpy with extensive debridement and gastroc slide  . CARDIAC CATHETERIZATION  11/22/2009 and 2005   WNL  . CESAREAN SECTION    . CHOLECYSTECTOMY  09/08/2006   lap. chole.  . CHONDROPLASTY  06/17/2012   Procedure: CHONDROPLASTY;  Surgeon: Carole Civil, MD;  [facility];  Service: Orthopedics;  Laterality: Right;  right  patella  . ESOPHAGOGASTRODUODENOSCOPY N/A 05/14/2015   Procedure: ESOPHAGOGASTRODUODENOSCOPY (EGD);  Surgeon: Rogene Houston, MD;  [facility];  Service: Endoscopy;  Laterality: N/A;  730  . INGUINAL HERNIA REPAIR  10/30/2008   right  . KNEE ARTHROSCOPY WITH LATERAL MENISECTOMY Left 12/23/2016   Procedure: LEFT KNEE ARTHROSCOPY WITH LATERAL MENISECTOMY;  Surgeon: Carole Civil, MD;  Location[facility];  Service: Orthopedics;  Laterality: Left;  . KNEE ARTHROSCOPY WITH MEDIAL MENISECTOMY Left 12/23/2016   Procedure: LEFT KNEE ARTHROSCOPY WITH MEDIAL MENISECTOMY CHONDROPLASTY PATELLA  AND MEDIAL FEMORAL CONDYLE LEFT KNEE;  Surgeon: Carole Civil, MD;  Location[facility];  Service: Orthopedics;  Laterality: Left;  . SHOULDER ARTHROSCOPY WITH SUBACROMIAL DECOMPRESSION AND BICEP TENDON REPAIR Left 12/21/2017   Procedure: Left shoulder arthroscopic biceps tenodesis, SAD, DCR and labrum debridement;  Surgeon: Nicholes Stairs, MD;  Location: [facility];  Service: Orthopedics;  Laterality: Left;  120 mins  . SHOULDER SURGERY     right x 2     There were no vitals filed for this visit.  Subjective Assessment - 02/03/18 1304    Subjective   S: I'm still sore from the last session.     Currently in Pain?  Yes    Pain Score  6     Pain Location  Shoulder    Pain Orientation  Left    Pain Descriptors / Indicators  Aching;Sore    Pain Type  Acute pain    Pain Radiating Towards  elbow    Pain Onset  More than a month ago    Pain Frequency  Constant    Aggravating Factors   arm movement    Pain Relieving Factors  ice, pain medication    Effect of Pain on Daily Activities  mod effect on ADLs    Multiple Pain Sites  No         [facility] OT Assessment - 02/03/18 1304      Assessment   Medical Diagnosis  Left shoulder SLAP, debridement, and bicep tenodesis      Precautions   Precautions  Shoulder    Type of Shoulder Precautions  Follow Standard SLAP repair protocol.     Shoulder  Interventions  Shoulder sling/immobilizer               OT Treatments/Exercises (OP) - 02/03/18 1306      Exercises   Exercises  Shoulder      Shoulder Exercises: Supine   Protraction  PROM;5 reps;AAROM;12 reps    Horizontal ABduction  PROM;5 reps;AAROM;12 reps    External Rotation  PROM;5 reps;AAROM;12 reps    Internal Rotation  PROM;5 reps;AAROM;12 reps    Flexion  PROM;5 reps;AAROM;12 reps    ABduction  PROM;5 reps;AAROM;12 reps      Shoulder Exercises: Pulleys   Flexion  1 minute    ABduction  1 minute      Shoulder Exercises: ROM/Strengthening   Thumb Tacks  1'      Manual Therapy   Manual Therapy  Myofascial release    Manual therapy comments  completed seperately from all other skilled interventions    Myofascial Release  myofascial release to left shoulder, scapular, and upper arm region to decrease pain and improve pain free mobility.                OT Short Term Goals - 01/12/18 1423      OT SHORT TERM GOAL #1   Title  Patient will be educated and independent with HEP for left shoulder mobility for improved abiilty to return to normal work and daily tasks.     Time  6    Period  Weeks    Status  On-going      OT SHORT TERM GOAL #2   Title  patient will improve Left shoulder P/ROM to WNL to increase ability to complete dressing tasks with less difficulty.     Time  6    Period  Weeks    Status  On-going      OT SHORT TERM GOAL #3   Title  Patient will increase left shoulder strength to 3+/5 in order to complete lightweight household actiivities with LUE.     Time  6    Period  Weeks    Status  On-going      OT SHORT TERM GOAL #4   Title  Patient will decrease pain level in left shoulder to 5/10 with daily task completion.     Time  6    Period  Weeks    Status  On-going      OT SHORT TERM GOAL #5   Title  Patient will decrease fascial restrictions in LUE to mod amount in order to increase functional mobility needed to  complete  overhead and reaching tasks.     Time  6    Period  Weeks    Status  On-going        OT Long Term Goals - 01/12/18 1424      OT LONG TERM GOAL #1   Title  Patient will return to highest level of independence with her LUE in order to return to work and complete required tasks.     Time  12    Period  Weeks    Status  On-going      OT LONG TERM GOAL #2   Title  patient will increase A/ROM of LUE to WNL to increase ability to complete over head reaching tasks without difficulty.     Time  12    Period  Weeks    Status  On-going      OT LONG TERM GOAL #3   Title  Patient will increase LUE strength to 4+/5 to increase ability to complete chest compressions during CPR when needed.     Time  12    Period  Weeks    Status  On-going      OT LONG TERM GOAL #4   Title  Patient will decrease fascial restrictions to min amount or less in order to increase functional mobility needed for overhead and reaching tasks.     Time  12    Period  Weeks    Status  On-going      OT LONG TERM GOAL #5   Title  Patient will report a decreased level of pain of approximately 2/10 when completing work or daily tasks.     Time  12    Period  Weeks    Status  On-going            Plan - 02/03/18 1326    Clinical Impression Statement  A: Pt reports sorenss since last session, is completing HEP daily. Continued with manual therapy to address fascial retrictions along left upper arm, deltoid, and trapezius regions. Continued with AA/ROM supine and seated this session, pt able to achieve ROM at approximately 60-75%. Added thumb tacks and resumed pulleys this session, did not complete seated exercises due to time constraints. Verbal cuing for form and technique during exercises.     Plan  P: Resume seated AA/ROM, add wall wash       Patient will benefit from skilled therapeutic intervention in order to improve the following deficits and impairments:  Pain, Decreased range of motion, Impaired UE  functional use, Increased fascial restrictions, Decreased strength, Impaired flexibility  Visit Diagnosis: Other symptoms and signs involving the musculoskeletal system  Acute pain of left shoulder  Stiffness of left shoulder, not elsewhere classified    Problem List Patient Active Problem List   Diagnosis Date Noted  . Derangement of anterior horn of lateral meniscus of left knee   . Derangement of posterior horn of medial meniscus of left knee   . Chest pain 04/25/2016  . Hypokalemia 04/25/2016  . Hyperglycemia 04/25/2016  . Diverticulitis of colon 07/17/2014  . Nausea without vomiting 07/17/2014  . Crohn disease ([place]) 07/17/2014  . Diverticulitis of colon without hemorrhage 08/10/2013  . History of arthroscopy of right knee 07/25/2012  . Difficulty in walking(719.7) 06/29/2012  . Weakness of right leg 06/29/2012  . Posterior tibial tendonitis 11/11/2011  . PTTD (posterior tibial tendon dysfunction) 11/11/2011  . Knee pain, right 11/11/2011  . Chest pain 07/16/2011  . Primary osteoarthritis of  left knee 02/25/2010  . PAIN IN JOINT, MULTIPLE SITES 02/25/2010  . Essential hypertension 08/13/2009  . HIATAL HERNIA 08/13/2009  . PALPITATIONS 08/13/2009  . DYSPNEA 08/13/2009   Guadelupe Sabin, OTR/L  [phone] 02/03/2018, 1:48 PM  [facility] [address] [place], [place], [address] Phone: [phone]   Fax:  [phone]  Name: KAMALI SAKATA MRN: [id number] Date of Birth: Sep 26, 1966

## 2018-02-04 ENCOUNTER — Encounter: Payer: Self-pay | Admitting: Orthopedic Surgery

## 2018-02-04 ENCOUNTER — Ambulatory Visit: Payer: 59 | Admitting: Orthopedic Surgery

## 2018-02-04 VITALS — BP 120/92 | HR 87 | Ht 71.5 in | Wt 225.0 lb

## 2018-02-04 DIAGNOSIS — M1711 Unilateral primary osteoarthritis, right knee: Secondary | ICD-10-CM

## 2018-02-04 NOTE — Progress Notes (Signed)
Progress Note   Patient ID: [name], female   DOB: [birth year], 52 y.o.   MRN: [id number]  Chief Complaint  Patient presents with  . Knee Pain    right knee PAIN WORSE      Medical decision-making Encounter Diagnosis  Name Primary?  . Primary osteoarthritis of right knee Yes    PLAN:  Procedure note right knee injection verbal consent was obtained to inject right knee joint  Timeout was completed to confirm the site of injection  The medications used were 40 mg of Depo-Medrol and 1% lidocaine 3 cc  Anesthesia was provided by ethyl chloride and the skin was prepped with alcohol.  After cleaning the skin with alcohol a 20-gauge needle was used to inject the right knee joint. There were no complications. A sterile bandage was applied.  Return in 2 months, she will see the doctors regarding her left shoulder left knee left ankle which her Worker's Compensation injury.  We would certainly not proceed with right knee replacement at this time because her left side is completely unable to support her.  I have advised that she get a cane for protection     Chief Complaint  Patient presents with  . Knee Pain    right knee wants injection today     52 year old female with severe end-stage arthritis of the right knee status post left shoulder surgery secondary to Worker's Compensation injury which also involve the left knee injury and a left ankle injury.  She will see her foot and ankle specialist regarding her left ankle she is currently in a left ankle brace she is having pain and swelling of her left knee and increasing pain of her right knee    Review of Systems  Musculoskeletal:       Left knee and ankle giving way left knee swelling   Current Meds  Medication Sig  . albuterol (PROVENTIL) (2.5 MG/3ML) 0.083% nebulizer solution Take 2.5 mg by nebulization every 6 (six) hours as needed. For shortness of breath  . albuterol (VENTOLIN HFA) 108 (90 Base) MCG/ACT inhaler  Inhale 1-2 puffs into the lungs every 6 (six) hours as needed for wheezing or shortness of breath.  Marland Kitchen amLODipine (NORVASC) 5 MG tablet Take 5 mg by mouth at bedtime.  . budesonide-formoterol (SYMBICORT) 160-4.5 MCG/ACT inhaler Inhale 2 puffs into the lungs 2 (two) times daily.  . cetirizine (ZYRTEC) 10 MG tablet Take 10 mg by mouth daily.  . cyclobenzaprine (FLEXERIL) 10 MG tablet TAKE ONE TABLET BY MOUTH 3 TIMES DAILY AS NEEDED FOR MUSCLE SPASM(S).  Marland Kitchen diclofenac sodium (VOLTAREN) 1 % GEL Apply 4 g topically 4 (four) times daily.  Marland Kitchen EPINEPHrine (EPIPEN) 0.3 mg/0.3 mL SOAJ injection Inject 0.3 mLs (0.3 mg total) into the muscle once.  . famotidine (PEPCID) 20 MG tablet Take 20 mg by mouth as needed for heartburn or indigestion.  . fluticasone (FLONASE) 50 MCG/ACT nasal spray Place 1 spray into both nostrils daily.  Marland Kitchen gabapentin (NEURONTIN) 300 MG capsule TAKE 1 CAPSULE BY MOUTH ONCE DAILY AT BEDTIME  . IBU 800 MG tablet TAKE (1) TABLET BY MOUTH (3) TIMES DAILY. (Patient taking differently: TAKE (1) TABLET BY as needed)  . losartan (COZAAR) 100 MG tablet Take 100 mg by mouth at bedtime.   . meclizine (ANTIVERT) 25 MG tablet Take 25 mg by mouth 3 (three) times daily as needed for dizziness.  . meloxicam (MOBIC) 7.5 MG tablet Take 7.5 mg by mouth 2 (two) times daily.  Marland Kitchen  montelukast (SINGULAIR) 10 MG tablet Take 10 mg by mouth at bedtime.  . ondansetron (ZOFRAN) 4 MG tablet Take 4 mg by mouth every 8 (eight) hours as needed for nausea or vomiting.  Marland Kitchen oxyCODONE (ROXICODONE) 5 MG immediate release tablet Take 1-2 tablets (5-10 mg total) by mouth every 4 (four) hours as needed for moderate pain or severe pain.  . pantoprazole (PROTONIX) 40 MG tablet TAKE (1) TABLET BY MOUTH TWICE DAILY BEFORE A MEAL. (Patient taking differently: TAKE (1) TABLET BY MOUTH TWICE DAILY as needed)  . zolpidem (AMBIEN) 5 MG tablet zolpidem 5 mg tablet    Allergies  Allergen Reactions  . Iodinated Diagnostic Agents  Anaphylaxis and Hives  . Other Shortness Of Breath    Lemon grass  . Wasp Venom Anaphylaxis and Shortness Of Breath  . Pollen Extract Swelling  . Adhesive [Tape] Rash  . Hydromorphone Hcl Nausea And Vomiting  . Omnipaque [Iohexol] Hives and Nausea Only    Pt. Was premedicated with emergent protocol.     BP (!) 120/92   Pulse 87   Ht 5' 11.5" (1.816 m)   Wt 225 lb (102.1 kg)   BMI 30.94 kg/m   Physical Exam  Constitutional: She is oriented to person, place, and time. She appears well-developed and well-nourished.  Musculoskeletal:       Legs: Neurological: She is alert and oriented to person, place, and time.  Psychiatric: She has a normal mood and affect. Judgment normal.  Vitals reviewed.      Arther Abbott, MD 02/04/2018 9:18 AM

## 2018-02-07 ENCOUNTER — Ambulatory Visit (HOSPITAL_COMMUNITY): Payer: PRIVATE HEALTH INSURANCE

## 2018-02-07 ENCOUNTER — Encounter (HOSPITAL_COMMUNITY): Payer: Self-pay

## 2018-02-07 ENCOUNTER — Other Ambulatory Visit: Payer: Self-pay

## 2018-02-07 DIAGNOSIS — M25512 Pain in left shoulder: Secondary | ICD-10-CM | POA: Diagnosis not present

## 2018-02-07 DIAGNOSIS — M25612 Stiffness of left shoulder, not elsewhere classified: Secondary | ICD-10-CM

## 2018-02-07 DIAGNOSIS — R29898 Other symptoms and signs involving the musculoskeletal system: Secondary | ICD-10-CM | POA: Diagnosis not present

## 2018-02-07 DIAGNOSIS — M25572 Pain in left ankle and joints of left foot: Secondary | ICD-10-CM | POA: Diagnosis not present

## 2018-02-07 DIAGNOSIS — R2689 Other abnormalities of gait and mobility: Secondary | ICD-10-CM | POA: Diagnosis not present

## 2018-02-07 DIAGNOSIS — M6281 Muscle weakness (generalized): Secondary | ICD-10-CM | POA: Diagnosis not present

## 2018-02-07 NOTE — Patient Instructions (Signed)
Repeat all exercises 10-15 times, 1-2 times per day.  1) Shoulder Protraction    Begin with elbows by your side, slowly "punch" straight out in front of you.      2) Shoulder Flexion  Supine:     Standing:             3) Horizontal abduction/adduction  Supine:   Standing:           Begin with arms straight out in front of you, bring out to the side in at "T" shape. Keep arms straight entire time.                 4) Internal & External Rotation    *No band* -Stand with elbows at the side and elbows bent 90 degrees. Move your forearms away from your body, then bring back inward toward the body.     5) Shoulder Abduction  Supine:     Standing:       Lying on your back begin with your arms flat on the table next to your side. Slowly move your arms out to the side so that they go overhead, in a jumping jack or snow angel movement.

## 2018-02-07 NOTE — Therapy (Signed)
[facility] [address] [place], [place], [address] Phone: [phone]   Fax:  [phone]  Occupational Therapy Treatment  Patient Details  Name: [name] MRN: [id number] Date of Birth: Aug 26, 1966 Referring Provider: Dr. Victorino December   Encounter Date: 02/07/2018  OT End of Session - 02/07/18 1328    Visit Number  10    Number of Visits  24    Date for OT Re-Evaluation  04/05/18    Authorization Type  Workers compensation approved 16 visits for her shoulder. If needing more, we will need to request them.     Authorization - Visit Number  10    Authorization - Number of Visits  16    OT Start Time  0037 Pt arrived late    OT Stop Time  1350    OT Time Calculation (min)  36 min    Activity Tolerance  Patient tolerated treatment well    Behavior During Therapy  WFL for tasks assessed/performed       Past Medical History:  Diagnosis Date  . Arthritis   . Asthma   . Chest pain    cath 2005 norm cors, repeat cath Feb 2011 normal cors and only minimally  elevated pulmonary pressures  . Crohn disease ([place])   . Diverticulosis   . GERD (gastroesophageal reflux disease)   . Hiatal hernia   . Hypertension   . Obesity   . Palpitations   . Sleep apnea sleep study 11/03/2009    cpap; does sometimes, doesnt use every night.    Past Surgical History:  Procedure Laterality Date  . ABDOMINAL HYSTERECTOMY    . ABDOMINAL HYSTERECTOMY    . ANKLE ARTHROSCOPY  06/01/2012   Procedure: ANKLE ARTHROSCOPY;  Surgeon: Colin Rhein, MD;  Location: [facility];  Service: Orthopedics;  Laterality: Left;  left ankle arthorsocpy with extensive debridement and gastroc slide  . CARDIAC CATHETERIZATION  11/22/2009 and 2005   WNL  . CESAREAN SECTION    . CHOLECYSTECTOMY  09/08/2006   lap. chole.  . CHONDROPLASTY  06/17/2012   Procedure: CHONDROPLASTY;  Surgeon: Carole Civil, MD;  [facility];  Service: Orthopedics;   Laterality: Right;  right patella  . ESOPHAGOGASTRODUODENOSCOPY N/A 05/14/2015   Procedure: ESOPHAGOGASTRODUODENOSCOPY (EGD);  Surgeon: Rogene Houston, MD;  [facility];  Service: Endoscopy;  Laterality: N/A;  730  . INGUINAL HERNIA REPAIR  10/30/2008   right  . KNEE ARTHROSCOPY WITH LATERAL MENISECTOMY Left 12/23/2016   Procedure: LEFT KNEE ARTHROSCOPY WITH LATERAL MENISECTOMY;  Surgeon: Carole Civil, MD;  [facility];  Service: Orthopedics;  Laterality: Left;  . KNEE ARTHROSCOPY WITH MEDIAL MENISECTOMY Left 12/23/2016   Procedure: LEFT KNEE ARTHROSCOPY WITH MEDIAL MENISECTOMY CHONDROPLASTY PATELLA  AND MEDIAL FEMORAL CONDYLE LEFT KNEE;  Surgeon: Carole Civil, MD;  [facility];  Service: Orthopedics;  Laterality: Left;  . SHOULDER ARTHROSCOPY WITH SUBACROMIAL DECOMPRESSION AND BICEP TENDON REPAIR Left 12/21/2017   Procedure: Left shoulder arthroscopic biceps tenodesis, SAD, DCR and labrum debridement;  Surgeon: Nicholes Stairs, MD;  Location: [facility];  Service: Orthopedics;  Laterality: Left;  120 mins  . SHOULDER SURGERY     right x 2     There were no vitals filed for this visit.  Subjective Assessment - 02/07/18 1326    Subjective   S: I was trying to get onto the stretcher today at the [facility] office and I cranked my shoulder  and it hurts now.     Currently in Pain?  Yes    Pain Score  8     Pain Location  Shoulder    Pain Orientation  Left    Pain Descriptors / Indicators  Aching;Sore    Pain Type  Acute pain         [facility] OT Assessment - 02/07/18 1327      Assessment   Medical Diagnosis  Left shoulder SLAP, debridement, and bicep tenodesis      Precautions   Precautions  Shoulder    Type of Shoulder Precautions  Follow Standard SLAP repair protocol.                OT Treatments/Exercises (OP) - 02/07/18 1327      Exercises   Exercises  Shoulder      Shoulder Exercises: Supine   Protraction  AROM;10 reps    Horizontal  ABduction  AROM;10 reps    External Rotation  AROM;10 reps    Internal Rotation  AROM;10 reps    Flexion  AROM;10 reps    ABduction  AROM;10 reps      Shoulder Exercises: Seated   Protraction  AROM;10 reps    External Rotation  Theraband;10 reps    Theraband Level (Shoulder External Rotation)  Level 2 (Red)    Internal Rotation  Theraband;10 reps    Theraband Level (Shoulder Internal Rotation)  Level 2 (Red)      Shoulder Exercises: Standing   Protraction  --      Shoulder Exercises: ROM/Strengthening   UBE (Upper Arm Bike)  Level 1 1' flexion 1' extension  pace: requested that patient remain under 4.0    Proximal Shoulder Strengthening, Supine  10X with no rest breaks      Manual Therapy   Manual Therapy  Myofascial release    Manual therapy comments  completed seperately from all other skilled interventions    Myofascial Release  myofascial release to left shoulder, scapular, and upper arm region to decrease pain and improve pain free mobility.                OT Short Term Goals - 01/12/18 1423      OT SHORT TERM GOAL #1   Title  Patient will be educated and independent with HEP for left shoulder mobility for improved abiilty to return to normal work and daily tasks.     Time  6    Period  Weeks    Status  On-going      OT SHORT TERM GOAL #2   Title  patient will improve Left shoulder P/ROM to WNL to increase ability to complete dressing tasks with less difficulty.     Time  6    Period  Weeks    Status  On-going      OT SHORT TERM GOAL #3   Title  Patient will increase left shoulder strength to 3+/5 in order to complete lightweight household actiivities with LUE.     Time  6    Period  Weeks    Status  On-going      OT SHORT TERM GOAL #4   Title  Patient will decrease pain level in left shoulder to 5/10 with daily task completion.     Time  6    Period  Weeks    Status  On-going      OT SHORT TERM GOAL #5   Title  Patient will decrease fascial  restrictions  in LUE to mod amount in order to increase functional mobility needed to complete overhead and reaching tasks.     Time  6    Period  Weeks    Status  On-going        OT Long Term Goals - 01/12/18 1424      OT LONG TERM GOAL #1   Title  Patient will return to highest level of independence with her LUE in order to return to work and complete required tasks.     Time  12    Period  Weeks    Status  On-going      OT LONG TERM GOAL #2   Title  patient will increase A/ROM of LUE to WNL to increase ability to complete over head reaching tasks without difficulty.     Time  12    Period  Weeks    Status  On-going      OT LONG TERM GOAL #3   Title  Patient will increase LUE strength to 4+/5 to increase ability to complete chest compressions during CPR when needed.     Time  12    Period  Weeks    Status  On-going      OT LONG TERM GOAL #4   Title  Patient will decrease fascial restrictions to min amount or less in order to increase functional mobility needed for overhead and reaching tasks.     Time  12    Period  Weeks    Status  On-going      OT LONG TERM GOAL #5   Title  Patient will report a decreased level of pain of approximately 2/10 when completing work or daily tasks.     Time  12    Period  Weeks    Status  On-going            Plan - 02/07/18 1328    Clinical Impression Statement  A: Patient arrived late from MD appointmen tin [place]. Began week 7 of protocol which states that P/ROM should be avoided. A/ROM supine was initiated. Patient's HEP was updated this session for supine A/ROM. VC for form and technique. Manual therapy techniques completed to LUE for increased fascial restrictions and pain level.     Plan  P: Add wall wash. Resume supine A/ROM. Add scapular strengthening with band (keep extension at neutral and not further back)    Consulted and Agree with Plan of Care  Patient       Patient will benefit from skilled therapeutic  intervention in order to improve the following deficits and impairments:  Pain, Decreased range of motion, Impaired UE functional use, Increased fascial restrictions, Decreased strength, Impaired flexibility  Visit Diagnosis: Other symptoms and signs involving the musculoskeletal system  Acute pain of left shoulder  Stiffness of left shoulder, not elsewhere classified    Problem List Patient Active Problem List   Diagnosis Date Noted  . Derangement of anterior horn of lateral meniscus of left knee   . Derangement of posterior horn of medial meniscus of left knee   . Chest pain 04/25/2016  . Hypokalemia 04/25/2016  . Hyperglycemia 04/25/2016  . Diverticulitis of colon 07/17/2014  . Nausea without vomiting 07/17/2014  . Crohn disease ([place]) 07/17/2014  . Diverticulitis of colon without hemorrhage 08/10/2013  . History of arthroscopy of right knee 07/25/2012  . Difficulty in walking(719.7) 06/29/2012  . Weakness of right leg 06/29/2012  . Posterior tibial tendonitis 11/11/2011  . PTTD (  posterior tibial tendon dysfunction) 11/11/2011  . Knee pain, right 11/11/2011  . Chest pain 07/16/2011  . Primary osteoarthritis of left knee 02/25/2010  . PAIN IN JOINT, MULTIPLE SITES 02/25/2010  . Essential hypertension 08/13/2009  . HIATAL HERNIA 08/13/2009  . PALPITATIONS 08/13/2009  . DYSPNEA 08/13/2009   Ailene Ravel, OTR/L,CBIS  [phone]  02/07/2018, 1:53 PM  [facility] [address] [place], [place], [address] Phone: [phone]   Fax:  [phone]  Name: MARIELIS SAMARA MRN: [id number] Date of Birth: 1966-07-08

## 2018-02-09 ENCOUNTER — Encounter (HOSPITAL_COMMUNITY): Payer: Self-pay | Admitting: Occupational Therapy

## 2018-02-09 ENCOUNTER — Ambulatory Visit (HOSPITAL_COMMUNITY): Payer: PRIVATE HEALTH INSURANCE | Admitting: Occupational Therapy

## 2018-02-09 DIAGNOSIS — M6281 Muscle weakness (generalized): Secondary | ICD-10-CM | POA: Diagnosis not present

## 2018-02-09 DIAGNOSIS — R29898 Other symptoms and signs involving the musculoskeletal system: Secondary | ICD-10-CM | POA: Diagnosis not present

## 2018-02-09 DIAGNOSIS — R2689 Other abnormalities of gait and mobility: Secondary | ICD-10-CM | POA: Diagnosis not present

## 2018-02-09 DIAGNOSIS — M25572 Pain in left ankle and joints of left foot: Secondary | ICD-10-CM | POA: Diagnosis not present

## 2018-02-09 DIAGNOSIS — M25612 Stiffness of left shoulder, not elsewhere classified: Secondary | ICD-10-CM

## 2018-02-09 DIAGNOSIS — M25512 Pain in left shoulder: Secondary | ICD-10-CM | POA: Diagnosis not present

## 2018-02-09 NOTE — Therapy (Signed)
[facility] [address] [place], [place], [address] Phone: [phone]   Fax:  [phone]  Occupational Therapy Treatment  Patient Details  Name: [name] MRN: [id number] Date of Birth: 12-25-1965 Referring Provider: Dr. Victorino December   Encounter Date: 02/09/2018  OT End of Session - 02/09/18 1432    Visit Number  11    Number of Visits  24    Date for OT Re-Evaluation  04/05/18    Authorization Type  Workers compensation approved 16 visits for her shoulder. If needing more, we will need to request them.     Authorization - Visit Number  11    Authorization - Number of Visits  16    OT Start Time  5035 pt arrived late    OT Stop Time  1345    OT Time Calculation (min)  41 min    Activity Tolerance  Patient tolerated treatment well    Behavior During Therapy  WFL for tasks assessed/performed       Past Medical History:  Diagnosis Date  . Arthritis   . Asthma   . Chest pain    cath 2005 norm cors, repeat cath Feb 2011 normal cors and only minimally  elevated pulmonary pressures  . Crohn disease ([place])   . Diverticulosis   . GERD (gastroesophageal reflux disease)   . Hiatal hernia   . Hypertension   . Obesity   . Palpitations   . Sleep apnea sleep study 11/03/2009    cpap; does sometimes, doesnt use every night.    Past Surgical History:  Procedure Laterality Date  . ABDOMINAL HYSTERECTOMY    . ABDOMINAL HYSTERECTOMY    . ANKLE ARTHROSCOPY  06/01/2012   Procedure: ANKLE ARTHROSCOPY;  Surgeon: Colin Rhein, MD;  Location: [facility];  Service: Orthopedics;  Laterality: Left;  left ankle arthorsocpy with extensive debridement and gastroc slide  . CARDIAC CATHETERIZATION  11/22/2009 and 2005   WNL  . CESAREAN SECTION    . CHOLECYSTECTOMY  09/08/2006   lap. chole.  . CHONDROPLASTY  06/17/2012   Procedure: CHONDROPLASTY;  Surgeon: Carole Civil, MD;  [facility];  Service: Orthopedics;   Laterality: Right;  right patella  . ESOPHAGOGASTRODUODENOSCOPY N/A 05/14/2015   Procedure: ESOPHAGOGASTRODUODENOSCOPY (EGD);  Surgeon: Rogene Houston, MD;  [facility];  Service: Endoscopy;  Laterality: N/A;  730  . INGUINAL HERNIA REPAIR  10/30/2008   right  . KNEE ARTHROSCOPY WITH LATERAL MENISECTOMY Left 12/23/2016   Procedure: LEFT KNEE ARTHROSCOPY WITH LATERAL MENISECTOMY;  Surgeon: Carole Civil, MD;  Location: [facility];  Service: Orthopedics;  Laterality: Left;  . KNEE ARTHROSCOPY WITH MEDIAL MENISECTOMY Left 12/23/2016   Procedure: LEFT KNEE ARTHROSCOPY WITH MEDIAL MENISECTOMY CHONDROPLASTY PATELLA  AND MEDIAL FEMORAL CONDYLE LEFT KNEE;  Surgeon: Carole Civil, MD;  Location: [facility];  Service: Orthopedics;  Laterality: Left;  . SHOULDER ARTHROSCOPY WITH SUBACROMIAL DECOMPRESSION AND BICEP TENDON REPAIR Left 12/21/2017   Procedure: Left shoulder arthroscopic biceps tenodesis, SAD, DCR and labrum debridement;  Surgeon: Nicholes Stairs, MD;  Location: [facility];  Service: Orthopedics;  Laterality: Left;  120 mins  . SHOULDER SURGERY     right x 2     There were no vitals filed for this visit.  Subjective Assessment - 02/09/18 1305    Subjective   S: I woke up hurting again.     Currently in Pain?  Yes    Pain  Score  8     Pain Location  Shoulder    Pain Orientation  Left    Pain Descriptors / Indicators  Aching;Sore    Pain Type  Acute pain    Pain Radiating Towards  elbow    Pain Onset  More than a month ago    Pain Frequency  Constant    Aggravating Factors   arm movement    Pain Relieving Factors  ice, pain medication    Effect of Pain on Daily Activities  mod effect on ADLs    Multiple Pain Sites  No         [facility] OT Assessment - 02/09/18 1304      Assessment   Medical Diagnosis  Left shoulder SLAP, debridement, and bicep tenodesis      Precautions   Precautions  Shoulder    Type of Shoulder Precautions  Follow Standard SLAP repair protocol.                 OT Treatments/Exercises (OP) - 02/09/18 1306      Exercises   Exercises  Shoulder      Shoulder Exercises: Supine   Protraction  AROM;10 reps    Horizontal ABduction  AROM;10 reps    External Rotation  AROM;10 reps    Internal Rotation  AROM;10 reps    Flexion  AROM;10 reps    ABduction  AROM;10 reps      Shoulder Exercises: Seated   Extension  Theraband;10 reps    Theraband Level (Shoulder Extension)  Level 2 (Red)    Retraction  Theraband;10 reps    Theraband Level (Shoulder Retraction)  Level 2 (Red)    Row  Theraband;10 reps    Theraband Level (Shoulder Row)  Level 2 (Red)    Protraction  AROM;10 reps    Horizontal ABduction  AROM;10 reps    External Rotation  AROM;10 reps    Internal Rotation  AROM;10 reps    Flexion  AROM;10 reps    Abduction  AROM;10 reps      Shoulder Exercises: ROM/Strengthening   Proximal Shoulder Strengthening, Supine  10X with no rest breaks      Modalities   Modalities  Electrical Stimulation      Electrical Stimulation   Electrical Stimulation Location  left shoulder    Electrical Stimulation Action  interferential    Electrical Stimulation Parameters  7.4 CV    Electrical Stimulation Goals  Pain      Manual Therapy   Manual Therapy  Myofascial release    Manual therapy comments  completed seperately from all other skilled interventions    Myofascial Release  myofascial release to left shoulder, scapular, and upper arm region to decrease pain and improve pain free mobility.                OT Short Term Goals - 01/12/18 1423      OT SHORT TERM GOAL #1   Title  Patient will be educated and independent with HEP for left shoulder mobility for improved abiilty to return to normal work and daily tasks.     Time  6    Period  Weeks    Status  On-going      OT SHORT TERM GOAL #2   Title  patient will improve Left shoulder P/ROM to WNL to increase ability to complete dressing tasks with less difficulty.      Time  6    Period  Weeks  Status  On-going      OT SHORT TERM GOAL #3   Title  Patient will increase left shoulder strength to 3+/5 in order to complete lightweight household actiivities with LUE.     Time  6    Period  Weeks    Status  On-going      OT SHORT TERM GOAL #4   Title  Patient will decrease pain level in left shoulder to 5/10 with daily task completion.     Time  6    Period  Weeks    Status  On-going      OT SHORT TERM GOAL #5   Title  Patient will decrease fascial restrictions in LUE to mod amount in order to increase functional mobility needed to complete overhead and reaching tasks.     Time  6    Period  Weeks    Status  On-going        OT Long Term Goals - 01/12/18 1424      OT LONG TERM GOAL #1   Title  Patient will return to highest level of independence with her LUE in order to return to work and complete required tasks.     Time  12    Period  Weeks    Status  On-going      OT LONG TERM GOAL #2   Title  patient will increase A/ROM of LUE to WNL to increase ability to complete over head reaching tasks without difficulty.     Time  12    Period  Weeks    Status  On-going      OT LONG TERM GOAL #3   Title  Patient will increase LUE strength to 4+/5 to increase ability to complete chest compressions during CPR when needed.     Time  12    Period  Weeks    Status  On-going      OT LONG TERM GOAL #4   Title  Patient will decrease fascial restrictions to min amount or less in order to increase functional mobility needed for overhead and reaching tasks.     Time  12    Period  Weeks    Status  On-going      OT LONG TERM GOAL #5   Title  Patient will report a decreased level of pain of approximately 2/10 when completing work or daily tasks.     Time  12    Period  Weeks    Status  On-going            Plan - 02/09/18 1433    Clinical Impression Statement  A: Pt reports increased pain after progressing with protocol last session. Pt able  to complete A/ROM with minimal difficulty, range limited to 75%, including seated A/ROM. Continued with manual therapy to address fascial restrictions causing increased pain and stiffness limiting ROM. Verbal cuing for from during exercises. ES applied after session for pain management.     Plan  P: Add wall wash, continue with A/ROM exercises and update HEP for seated A/ROM        Patient will benefit from skilled therapeutic intervention in order to improve the following deficits and impairments:  Pain, Decreased range of motion, Impaired UE functional use, Increased fascial restrictions, Decreased strength, Impaired flexibility  Visit Diagnosis: Other symptoms and signs involving the musculoskeletal system  Acute pain of left shoulder  Stiffness of left shoulder, not elsewhere classified    Problem List Patient  Active Problem List   Diagnosis Date Noted  . Derangement of anterior horn of lateral meniscus of left knee   . Derangement of posterior horn of medial meniscus of left knee   . Chest pain 04/25/2016  . Hypokalemia 04/25/2016  . Hyperglycemia 04/25/2016  . Diverticulitis of colon 07/17/2014  . Nausea without vomiting 07/17/2014  . Crohn disease ([place]) 07/17/2014  . Diverticulitis of colon without hemorrhage 08/10/2013  . History of arthroscopy of right knee 07/25/2012  . Difficulty in walking(719.7) 06/29/2012  . Weakness of right leg 06/29/2012  . Posterior tibial tendonitis 11/11/2011  . PTTD (posterior tibial tendon dysfunction) 11/11/2011  . Knee pain, right 11/11/2011  . Chest pain 07/16/2011  . Primary osteoarthritis of left knee 02/25/2010  . PAIN IN JOINT, MULTIPLE SITES 02/25/2010  . Essential hypertension 08/13/2009  . HIATAL HERNIA 08/13/2009  . PALPITATIONS 08/13/2009  . DYSPNEA 08/13/2009    Guadelupe Sabin, OTR/L  [phone] 02/09/2018, 2:35 PM  [facility] [address] [place], [place], [address] Phone:  [phone]   Fax:  [phone]  Name: [name] MRN: [id number] Date of Birth: [birth year]

## 2018-02-14 ENCOUNTER — Ambulatory Visit (HOSPITAL_COMMUNITY): Payer: PRIVATE HEALTH INSURANCE | Admitting: Specialist

## 2018-02-14 ENCOUNTER — Telehealth (HOSPITAL_COMMUNITY): Payer: Self-pay | Admitting: Specialist

## 2018-02-14 NOTE — Telephone Encounter (Signed)
Patient had to cancel for personal reasons

## 2018-02-15 MED FILL — CYCLOBENZAPRINE 10 MG TAB: 10 | 15 days supply | Qty: 45 | Fill #0

## 2018-02-15 MED FILL — GABAPENTIN 300 MG CAPSULE: 300 | 30 days supply | Qty: 30 | Fill #1

## 2018-02-16 ENCOUNTER — Telehealth (HOSPITAL_COMMUNITY): Payer: Self-pay | Admitting: Occupational Therapy

## 2018-02-16 ENCOUNTER — Encounter (HOSPITAL_COMMUNITY): Payer: Self-pay | Admitting: Occupational Therapy

## 2018-02-16 ENCOUNTER — Ambulatory Visit (HOSPITAL_COMMUNITY): Payer: PRIVATE HEALTH INSURANCE | Admitting: Occupational Therapy

## 2018-02-16 DIAGNOSIS — R29898 Other symptoms and signs involving the musculoskeletal system: Secondary | ICD-10-CM

## 2018-02-16 DIAGNOSIS — M25612 Stiffness of left shoulder, not elsewhere classified: Secondary | ICD-10-CM | POA: Diagnosis not present

## 2018-02-16 DIAGNOSIS — R2689 Other abnormalities of gait and mobility: Secondary | ICD-10-CM | POA: Diagnosis not present

## 2018-02-16 DIAGNOSIS — M6281 Muscle weakness (generalized): Secondary | ICD-10-CM | POA: Diagnosis not present

## 2018-02-16 DIAGNOSIS — M25572 Pain in left ankle and joints of left foot: Secondary | ICD-10-CM | POA: Diagnosis not present

## 2018-02-16 DIAGNOSIS — M25512 Pain in left shoulder: Secondary | ICD-10-CM | POA: Diagnosis not present

## 2018-02-16 NOTE — Patient Instructions (Signed)

## 2018-02-16 NOTE — Telephone Encounter (Signed)
A appt was made for PT on5-21-19 We are still waiting on approval from workers comp. I have already informed pt that we will have to cancel that appt if we dont get the approval back before 02-22-18 WT

## 2018-02-16 NOTE — Therapy (Signed)
[facility] [address] [place], [place], [address] Phone: [phone]   Fax:  [phone]  Occupational Therapy Treatment  Patient Details  Name: [name] MRN: [id number] Date of Birth: 1966-03-10 Referring Provider: Dr. Victorino December   Encounter Date: 02/16/2018  OT End of Session - 02/16/18 1347    Visit Number  12    Number of Visits  24    Date for OT Re-Evaluation  04/05/18    Authorization Type  Workers compensation approved 16 visits for her shoulder. If needing more, we will need to request them.     Authorization - Visit Number  12    Authorization - Number of Visits  16    OT Start Time  1302    OT Stop Time  1342    OT Time Calculation (min)  40 min    Activity Tolerance  Patient tolerated treatment well    Behavior During Therapy  WFL for tasks assessed/performed       Past Medical History:  Diagnosis Date  . Arthritis   . Asthma   . Chest pain    cath 2005 norm cors, repeat cath Feb 2011 normal cors and only minimally  elevated pulmonary pressures  . Crohn disease ([place])   . Diverticulosis   . GERD (gastroesophageal reflux disease)   . Hiatal hernia   . Hypertension   . Obesity   . Palpitations   . Sleep apnea sleep study 11/03/2009    cpap; does sometimes, doesnt use every night.    Past Surgical History:  Procedure Laterality Date  . ABDOMINAL HYSTERECTOMY    . ABDOMINAL HYSTERECTOMY    . ANKLE ARTHROSCOPY  06/01/2012   Procedure: ANKLE ARTHROSCOPY;  Surgeon: Colin Rhein, MD;  Location: [facility];  Service: Orthopedics;  Laterality: Left;  left ankle arthorsocpy with extensive debridement and gastroc slide  . CARDIAC CATHETERIZATION  11/22/2009 and 2005   WNL  . CESAREAN SECTION    . CHOLECYSTECTOMY  09/08/2006   lap. chole.  . CHONDROPLASTY  06/17/2012   Procedure: CHONDROPLASTY;  Surgeon: Carole Civil, MD;  Location[facility];  Service: Orthopedics;  Laterality: Right;  right  patella  . ESOPHAGOGASTRODUODENOSCOPY N/A 05/14/2015   Procedure: ESOPHAGOGASTRODUODENOSCOPY (EGD);  Surgeon: Rogene Houston, MD;  [facility];  Service: Endoscopy;  Laterality: N/A;  730  . INGUINAL HERNIA REPAIR  10/30/2008   right  . KNEE ARTHROSCOPY WITH LATERAL MENISECTOMY Left 12/23/2016   Procedure: LEFT KNEE ARTHROSCOPY WITH LATERAL MENISECTOMY;  Surgeon: Carole Civil, MD;  [facility];  Service: Orthopedics;  Laterality: Left;  . KNEE ARTHROSCOPY WITH MEDIAL MENISECTOMY Left 12/23/2016   Procedure: LEFT KNEE ARTHROSCOPY WITH MEDIAL MENISECTOMY CHONDROPLASTY PATELLA  AND MEDIAL FEMORAL CONDYLE LEFT KNEE;  Surgeon: Carole Civil, MD;  [facility];  Service: Orthopedics;  Laterality: Left;  . SHOULDER ARTHROSCOPY WITH SUBACROMIAL DECOMPRESSION AND BICEP TENDON REPAIR Left 12/21/2017   Procedure: Left shoulder arthroscopic biceps tenodesis, SAD, DCR and labrum debridement;  Surgeon: Nicholes Stairs, MD;  Location: [facility];  Service: Orthopedics;  Laterality: Left;  120 mins  . SHOULDER SURGERY     right x 2     There were no vitals filed for this visit.  Subjective Assessment - 02/16/18 1303    Subjective   S: The TENS helped a little.     Currently in Pain?  Yes    Pain Score  7  Pain Location  Shoulder    Pain Orientation  Left    Pain Descriptors / Indicators  Aching;Sore    Pain Type  Acute pain    Pain Radiating Towards  elbow    Pain Onset  More than a month ago    Pain Frequency  Constant    Aggravating Factors   arm movement    Pain Relieving Factors  ice, pain medication    Effect of Pain on Daily Activities  mod effect on ADLs    Multiple Pain Sites  No         [facility] OT Assessment - 02/16/18 1303      Assessment   Medical Diagnosis  Left shoulder SLAP, debridement, and bicep tenodesis      Precautions   Precautions  Shoulder    Type of Shoulder Precautions  Follow Standard SLAP repair protocol.                OT  Treatments/Exercises (OP) - 02/16/18 1305      Exercises   Exercises  Shoulder      Shoulder Exercises: Supine   Protraction  AROM;12 reps    Horizontal ABduction  AROM;12 reps    External Rotation  AROM;12 reps    Internal Rotation  AROM;12 reps    Flexion  AROM;12 reps    ABduction  AROM;12 reps      Shoulder Exercises: Seated   Extension  Theraband;10 reps    Theraband Level (Shoulder Extension)  Level 2 (Red)    Row  Theraband;10 reps    Theraband Level (Shoulder Row)  Level 2 (Red)    Protraction  AROM;10 reps    Horizontal ABduction  AROM;10 reps    External Rotation  AROM;10 reps    Internal Rotation  AROM;10 reps    Flexion  AROM;10 reps    Abduction  AROM;10 reps      Shoulder Exercises: ROM/Strengthening   Proximal Shoulder Strengthening, Supine  12X with no rest breaks    Proximal Shoulder Strengthening, Seated  10X each no rest breaks      Shoulder Exercises: Stretch   Corner Stretch  3 reps;10 seconds    External Rotation Stretch  3 reps;10 seconds    Wall Stretch - Flexion  3 reps;10 seconds      Manual Therapy   Manual Therapy  Myofascial release    Manual therapy comments  completed seperately from all other skilled interventions    Myofascial Release  myofascial release to left shoulder, scapular, and upper arm region to decrease pain and improve pain free mobility.              OT Education - 02/16/18 1327    Education provided  Yes    Education Details  A/ROM exercises    Person(s) Educated  Patient    Methods  Explanation;Demonstration;Handout    Comprehension  Verbalized understanding;Returned demonstration       OT Short Term Goals - 01/12/18 1423      OT SHORT TERM GOAL #1   Title  Patient will be educated and independent with HEP for left shoulder mobility for improved abiilty to return to normal work and daily tasks.     Time  6    Period  Weeks    Status  On-going      OT SHORT TERM GOAL #2   Title  patient will improve Left  shoulder P/ROM to WNL to increase ability to complete dressing tasks with  less difficulty.     Time  6    Period  Weeks    Status  On-going      OT SHORT TERM GOAL #3   Title  Patient will increase left shoulder strength to 3+/5 in order to complete lightweight household actiivities with LUE.     Time  6    Period  Weeks    Status  On-going      OT SHORT TERM GOAL #4   Title  Patient will decrease pain level in left shoulder to 5/10 with daily task completion.     Time  6    Period  Weeks    Status  On-going      OT SHORT TERM GOAL #5   Title  Patient will decrease fascial restrictions in LUE to mod amount in order to increase functional mobility needed to complete overhead and reaching tasks.     Time  6    Period  Weeks    Status  On-going        OT Long Term Goals - 01/12/18 1424      OT LONG TERM GOAL #1   Title  Patient will return to highest level of independence with her LUE in order to return to work and complete required tasks.     Time  12    Period  Weeks    Status  On-going      OT LONG TERM GOAL #2   Title  patient will increase A/ROM of LUE to WNL to increase ability to complete over head reaching tasks without difficulty.     Time  12    Period  Weeks    Status  On-going      OT LONG TERM GOAL #3   Title  Patient will increase LUE strength to 4+/5 to increase ability to complete chest compressions during CPR when needed.     Time  12    Period  Weeks    Status  On-going      OT LONG TERM GOAL #4   Title  Patient will decrease fascial restrictions to min amount or less in order to increase functional mobility needed for overhead and reaching tasks.     Time  12    Period  Weeks    Status  On-going      OT LONG TERM GOAL #5   Title  Patient will report a decreased level of pain of approximately 2/10 when completing work or daily tasks.     Time  12    Period  Weeks    Status  On-going            Plan - 02/16/18 1347    Clinical Impression  Statement  A: Pt continues to report increased pain in shoulder this week, however is pushing through to complete exercises. Continued with manual therapy to address fascial restrictions in shoulder regions, minimal restrictions palpated this date. Added shoulder stretches today working to improve ROM, verbal cuing for initial form. Did not complete retraction with band due to pain. Verbal cuing during session for form and technique. Updated HEP.     Plan  P: manual therapy prn. Add wall wash, follow up on A/ROM HEP. Add IR stretch and wall abduction stretch       Patient will benefit from skilled therapeutic intervention in order to improve the following deficits and impairments:  Pain, Decreased range of motion, Impaired UE functional use, Increased fascial restrictions, Decreased  strength, Impaired flexibility  Visit Diagnosis: Other symptoms and signs involving the musculoskeletal system  Acute pain of left shoulder  Stiffness of left shoulder, not elsewhere classified    Problem List Patient Active Problem List   Diagnosis Date Noted  . Derangement of anterior horn of lateral meniscus of left knee   . Derangement of posterior horn of medial meniscus of left knee   . Chest pain 04/25/2016  . Hypokalemia 04/25/2016  . Hyperglycemia 04/25/2016  . Diverticulitis of colon 07/17/2014  . Nausea without vomiting 07/17/2014  . Crohn disease ([place]) 07/17/2014  . Diverticulitis of colon without hemorrhage 08/10/2013  . History of arthroscopy of right knee 07/25/2012  . Difficulty in walking(719.7) 06/29/2012  . Weakness of right leg 06/29/2012  . Posterior tibial tendonitis 11/11/2011  . PTTD (posterior tibial tendon dysfunction) 11/11/2011  . Knee pain, right 11/11/2011  . Chest pain 07/16/2011  . Primary osteoarthritis of left knee 02/25/2010  . PAIN IN JOINT, MULTIPLE SITES 02/25/2010  . Essential hypertension 08/13/2009  . HIATAL HERNIA 08/13/2009  . PALPITATIONS 08/13/2009  .  DYSPNEA 08/13/2009   Guadelupe Sabin, OTR/L  [phone] 02/16/2018, 1:50 PM  [facility] [address] [place], [place], [address] Phone: [phone]   Fax:  [phone]  Name: [name] MRN: [id number] Date of Birth: 03/08/1966

## 2018-02-21 ENCOUNTER — Other Ambulatory Visit: Payer: Self-pay

## 2018-02-21 ENCOUNTER — Telehealth (HOSPITAL_COMMUNITY): Payer: Self-pay

## 2018-02-21 ENCOUNTER — Ambulatory Visit (HOSPITAL_COMMUNITY): Payer: PRIVATE HEALTH INSURANCE

## 2018-02-21 ENCOUNTER — Encounter (HOSPITAL_COMMUNITY): Payer: Self-pay

## 2018-02-21 DIAGNOSIS — M25572 Pain in left ankle and joints of left foot: Secondary | ICD-10-CM | POA: Diagnosis not present

## 2018-02-21 DIAGNOSIS — R29898 Other symptoms and signs involving the musculoskeletal system: Secondary | ICD-10-CM | POA: Diagnosis not present

## 2018-02-21 DIAGNOSIS — M25612 Stiffness of left shoulder, not elsewhere classified: Secondary | ICD-10-CM

## 2018-02-21 DIAGNOSIS — M25512 Pain in left shoulder: Secondary | ICD-10-CM

## 2018-02-21 DIAGNOSIS — M6281 Muscle weakness (generalized): Secondary | ICD-10-CM | POA: Diagnosis not present

## 2018-02-21 DIAGNOSIS — R2689 Other abnormalities of gait and mobility: Secondary | ICD-10-CM | POA: Diagnosis not present

## 2018-02-21 NOTE — Telephone Encounter (Signed)
Called patient regarding no show. Left voice message reminding patient of next scheduled appointment to call if she is unable to make it.   Ailene Ravel, OTR/L,CBIS  [phone]

## 2018-02-21 NOTE — Therapy (Addendum)
[name], [place], [address] Phone: [phone]   Fax:  [phone]  Occupational Therapy Treatment  Patient Details  Name: [name] MRN: [id number] Date of Birth: 04-16-1966 Referring Provider: Dr. Victorino December   Encounter Date: 02/21/2018  OT End of Session - 02/21/18 1630    Visit Number  13    Number of Visits  24    Date for OT Re-Evaluation  04/05/18 mini reassess: 03/21/18    Authorization Type  Workers compensation approved 16 visits for her shoulder. If needing more, we will need to request them.     Authorization - Visit Number  13    Authorization - Number of Visits  16    OT Start Time  [phone] Patient was late    OT Stop Time  0432    OT Time Calculation (min)  42 min    Activity Tolerance  Patient tolerated treatment well    Behavior During Therapy  WFL for tasks assessed/performed       Past Medical History:  Diagnosis Date  . Arthritis   . Asthma   . Chest pain    cath 2005 norm cors, repeat cath Feb 2011 normal cors and only minimally  elevated pulmonary pressures  . Crohn disease ([place])   . Diverticulosis   . GERD (gastroesophageal reflux disease)   . Hiatal hernia   . Hypertension   . Obesity   . Palpitations   . Sleep apnea sleep study 11/03/2009    cpap; does sometimes, doesnt use every night.    Past Surgical History:  Procedure Laterality Date  . ABDOMINAL HYSTERECTOMY    . ABDOMINAL HYSTERECTOMY    . ANKLE ARTHROSCOPY  06/01/2012   Procedure: ANKLE ARTHROSCOPY;  Surgeon: Colin Rhein, MD;  Location: [facility];  Service: Orthopedics;  Laterality: Left;  left ankle arthorsocpy with extensive debridement and gastroc slide  . CARDIAC CATHETERIZATION  11/22/2009 and 2005   WNL  . CESAREAN SECTION    . CHOLECYSTECTOMY  09/08/2006   lap. chole.  . CHONDROPLASTY  06/17/2012   Procedure: CHONDROPLASTY;  Surgeon: Carole Civil, MD;  Location: [facility];  Service:  Orthopedics;  Laterality: Right;  right patella  . ESOPHAGOGASTRODUODENOSCOPY N/A 05/14/2015   Procedure: ESOPHAGOGASTRODUODENOSCOPY (EGD);  Surgeon: Rogene Houston, MD;  [facility]: [facility];  Service: Endoscopy;  Laterality: N/A;  730  . INGUINAL HERNIA REPAIR  10/30/2008   right  . KNEE ARTHROSCOPY WITH LATERAL MENISECTOMY Left 12/23/2016   Procedure: LEFT KNEE ARTHROSCOPY WITH LATERAL MENISECTOMY;  Surgeon: Carole Civil, MD;  Location: [facility];  Service: Orthopedics;  Laterality: Left;  . KNEE ARTHROSCOPY WITH MEDIAL MENISECTOMY Left 12/23/2016   Procedure: LEFT KNEE ARTHROSCOPY WITH MEDIAL MENISECTOMY CHONDROPLASTY PATELLA  AND MEDIAL FEMORAL CONDYLE LEFT KNEE;  Surgeon: Carole Civil, MD;  Location: [facility];  Service: Orthopedics;  Laterality: Left;  . SHOULDER ARTHROSCOPY WITH SUBACROMIAL DECOMPRESSION AND BICEP TENDON REPAIR Left 12/21/2017   Procedure: Left shoulder arthroscopic biceps tenodesis, SAD, DCR and labrum debridement;  Surgeon: Nicholes Stairs, MD;  Location: [facility];  Service: Orthopedics;  Laterality: Left;  120 mins  . SHOULDER SURGERY     right x 2     There were no vitals filed for this visit.  Subjective Assessment - 02/21/18 1555    Subjective   S: The pain is better than it was. I have been trying to take it easy  to calm it down.    Currently in Pain?  Yes    Pain Score  4     Pain Location  Shoulder    Pain Orientation  Left    Pain Descriptors / Indicators  Aching;Sore    Pain Type  Acute pain    Pain Onset  More than a month ago    Pain Frequency  Constant    Aggravating Factors   arm movement    Pain Relieving Factors  ice, pain medication, TENS unit    Effect of Pain on Daily Activities  mod effect on ADLs    Multiple Pain Sites  No         [facility] OT Assessment - 02/21/18 1557      Assessment   Medical Diagnosis  Left shoulder SLAP, debridement, and bicep tenodesis      Precautions   Precautions  Shoulder    Type of Shoulder  Precautions  Follow Standard SLAP repair protocol.       ROM / Strength   AROM / PROM / Strength  AROM;PROM;Strength      Palpation   Palpation comment  max fascial restrictions in left bicep, upper arm, trapezius, and scapularis region.       AROM   Overall AROM Comments  Assessed supine and seated. IR/er adducted. A/ROM not assessed prior to this session.     AROM Assessment Site  Shoulder    Right/Left Shoulder  Left    Left Shoulder Flexion  120 Degrees seated: 130    Left Shoulder ABduction  122 Degrees seated: 125    Left Shoulder Internal Rotation  90 Degrees seated: 90    Left Shoulder External Rotation  35 Degrees seated: 32      PROM   Overall PROM Comments  Assessed supine. IR/er adducted    PROM Assessment Site  Shoulder    Right/Left Shoulder  Left    Left Shoulder Flexion  132 Degrees previous: 138    Left Shoulder ABduction  160 Degrees previous: 90    Left Shoulder Internal Rotation  90 Degrees previous: same    Left Shoulder External Rotation  34 Degrees previous: 30      Strength   Overall Strength Comments  assessed seated. IR/er adducted. Strength not assessed prior to this session.     Strength Assessment Site  Shoulder    Right/Left Shoulder  Left    Left Shoulder Flexion  3/5    Left Shoulder ABduction  3/5    Left Shoulder Internal Rotation  3/5    Left Shoulder External Rotation  3/5               OT Treatments/Exercises (OP) - 02/21/18 1558      Exercises   Exercises  Shoulder      Shoulder Exercises: Seated   Protraction  AROM;10 reps    Horizontal ABduction  AROM;10 reps    External Rotation  AROM;10 reps    Internal Rotation  AROM;10 reps    Flexion  AROM;10 reps    Abduction  AROM;10 reps      Shoulder Exercises: ROM/Strengthening   X to V Arms  5X    Proximal Shoulder Strengthening, Seated  10X each no rest breaks      Shoulder Exercises: Stretch   Internal Rotation Stretch  3 reps 15 seconds horizontal towel stretch     Wall Stretch - Flexion  2 reps 15 seconds    Wall Stretch -  ABduction  2 reps 15 seconds      Manual Therapy   Manual Therapy  Myofascial release    Manual therapy comments  completed seperately from all other skilled interventions    Myofascial Release  myofascial release to left shoulder, trapezius, scapular, and upper arm region to decrease pain and improve pain free mobility.              OT Education - 02/21/18 1629    Education provided  No       OT Short Term Goals - 01/12/18 1423      OT SHORT TERM GOAL #1   Title  Patient will be educated and independent with HEP for left shoulder mobility for improved abiilty to return to normal work and daily tasks.     Time  6    Period  Weeks    Status  On-going      OT SHORT TERM GOAL #2   Title  patient will improve Left shoulder P/ROM to WNL to increase ability to complete dressing tasks with less difficulty.     Time  6    Period  Weeks    Status  On-going      OT SHORT TERM GOAL #3   Title  Patient will increase left shoulder strength to 3+/5 in order to complete lightweight household actiivities with LUE.     Time  6    Period  Weeks    Status  On-going      OT SHORT TERM GOAL #4   Title  Patient will decrease pain level in left shoulder to 5/10 with daily task completion.     Time  6    Period  Weeks    Status  On-going      OT SHORT TERM GOAL #5   Title  Patient will decrease fascial restrictions in LUE to mod amount in order to increase functional mobility needed to complete overhead and reaching tasks.     Time  6    Period  Weeks    Status  On-going        OT Long Term Goals - 01/12/18 1424      OT LONG TERM GOAL #1   Title  Patient will return to highest level of independence with her LUE in order to return to work and complete required tasks.     Time  12    Period  Weeks    Status  On-going      OT LONG TERM GOAL #2   Title  patient will increase A/ROM of LUE to WNL to increase ability to  complete over head reaching tasks without difficulty.     Time  12    Period  Weeks    Status  On-going      OT LONG TERM GOAL #3   Title  Patient will increase LUE strength to 4+/5 to increase ability to complete chest compressions during CPR when needed.     Time  12    Period  Weeks    Status  On-going      OT LONG TERM GOAL #4   Title  Patient will decrease fascial restrictions to min amount or less in order to increase functional mobility needed for overhead and reaching tasks.     Time  12    Period  Weeks    Status  On-going      OT LONG TERM GOAL #5   Title  Patient will  report a decreased level of pain of approximately 2/10 when completing work or daily tasks.     Time  12    Period  Weeks    Status  On-going            Plan - 02/21/18 1634    Clinical Impression Statement  A: Pt reports she has been resting her shoulder for a couple days due to pain. Therapist continued with manual therapy to address fascial restrictions in shoulder regions, max restrictions palpated this date. Supine P/ROM and supine/seated A/ROM measurements taken for MD appointment on Wednesday. Pt demonstrated increased ROM overall. Internal rotation horizontal towel stretch and abduction against the wall stretch added. Verbal cuing during session for form and technique.    Plan  P: Continue with manual techniques in order to reduce fascial restrictions for increased ROM and joint mobility. Complete FOTO and review goals with patient. Progress to week 9 of SLAP protocol.     Consulted and Agree with Plan of Care  Patient       Patient will benefit from skilled therapeutic intervention in order to improve the following deficits and impairments:  Pain, Decreased range of motion, Impaired UE functional use, Increased fascial restrictions, Decreased strength, Impaired flexibility  Visit Diagnosis: Other symptoms and signs involving the musculoskeletal system  Acute pain of left  shoulder  Stiffness of left shoulder, not elsewhere classified    Problem List Patient Active Problem List   Diagnosis Date Noted  . Derangement of anterior horn of lateral meniscus of left knee   . Derangement of posterior horn of medial meniscus of left knee   . Chest pain 04/25/2016  . Hypokalemia 04/25/2016  . Hyperglycemia 04/25/2016  . Diverticulitis of colon 07/17/2014  . Nausea without vomiting 07/17/2014  . Crohn disease ([place]) 07/17/2014  . Diverticulitis of colon without hemorrhage 08/10/2013  . History of arthroscopy of right knee 07/25/2012  . Difficulty in walking(719.7) 06/29/2012  . Weakness of right leg 06/29/2012  . Posterior tibial tendonitis 11/11/2011  . PTTD (posterior tibial tendon dysfunction) 11/11/2011  . Knee pain, right 11/11/2011  . Chest pain 07/16/2011  . Primary osteoarthritis of left knee 02/25/2010  . PAIN IN JOINT, MULTIPLE SITES 02/25/2010  . Essential hypertension 08/13/2009  . HIATAL HERNIA 08/13/2009  . PALPITATIONS 08/13/2009  . DYSPNEA 08/13/2009    Essenmacher, Clarene Duke, OT student 02/21/2018, 5:01 PM  [facility] [address] [place], [place], [address] Phone: [phone]   Fax:  [phone]  Name: [name] MRN: [id number] Date of Birth: [birth year]

## 2018-02-22 ENCOUNTER — Other Ambulatory Visit: Payer: Self-pay

## 2018-02-22 ENCOUNTER — Ambulatory Visit (HOSPITAL_COMMUNITY): Payer: PRIVATE HEALTH INSURANCE | Admitting: Physical Therapy

## 2018-02-22 ENCOUNTER — Encounter (HOSPITAL_COMMUNITY): Payer: Self-pay | Admitting: Physical Therapy

## 2018-02-22 DIAGNOSIS — M6281 Muscle weakness (generalized): Secondary | ICD-10-CM

## 2018-02-22 DIAGNOSIS — R2689 Other abnormalities of gait and mobility: Secondary | ICD-10-CM | POA: Diagnosis not present

## 2018-02-22 DIAGNOSIS — M25512 Pain in left shoulder: Secondary | ICD-10-CM | POA: Diagnosis not present

## 2018-02-22 DIAGNOSIS — M25612 Stiffness of left shoulder, not elsewhere classified: Secondary | ICD-10-CM | POA: Diagnosis not present

## 2018-02-22 DIAGNOSIS — R29898 Other symptoms and signs involving the musculoskeletal system: Secondary | ICD-10-CM | POA: Diagnosis not present

## 2018-02-22 DIAGNOSIS — M25572 Pain in left ankle and joints of left foot: Secondary | ICD-10-CM

## 2018-02-23 ENCOUNTER — Ambulatory Visit (HOSPITAL_COMMUNITY): Payer: PRIVATE HEALTH INSURANCE | Admitting: Occupational Therapy

## 2018-02-23 ENCOUNTER — Encounter (HOSPITAL_COMMUNITY): Payer: Self-pay | Admitting: Occupational Therapy

## 2018-02-23 DIAGNOSIS — R29898 Other symptoms and signs involving the musculoskeletal system: Secondary | ICD-10-CM

## 2018-02-23 DIAGNOSIS — M25612 Stiffness of left shoulder, not elsewhere classified: Secondary | ICD-10-CM | POA: Diagnosis not present

## 2018-02-23 DIAGNOSIS — R2689 Other abnormalities of gait and mobility: Secondary | ICD-10-CM | POA: Diagnosis not present

## 2018-02-23 DIAGNOSIS — M25572 Pain in left ankle and joints of left foot: Secondary | ICD-10-CM | POA: Diagnosis not present

## 2018-02-23 DIAGNOSIS — M25512 Pain in left shoulder: Secondary | ICD-10-CM | POA: Diagnosis not present

## 2018-02-23 DIAGNOSIS — M6281 Muscle weakness (generalized): Secondary | ICD-10-CM | POA: Diagnosis not present

## 2018-02-23 NOTE — Therapy (Signed)
[facility], [place], [address] Phone: [phone]   Fax:  [phone]  Physical Therapy Evaluation  Patient Details  Name: [name] MRN: [id number] Date of Birth: 01-18-1966 Referring Provider: Wylene Simmer, MD   Encounter Date: 02/22/2018  PT End of Session - 02/22/18 0848    Visit Number  1    Number of Visits  8    Date for PT Re-Evaluation  03/25/18    Authorization Type  Workman's Compensation (Approved for 8 visits)     Authorization Time Period  02/22/18- 03/25/18    Authorization - Visit Number  1    Authorization - Number of Visits  8    PT Start Time  3664    PT Stop Time  1435    PT Time Calculation (min)  47 min    Equipment Utilized During Treatment  Other (comment) Left ankle brace    Activity Tolerance  Patient limited by pain;Patient tolerated treatment well    Behavior During Therapy  [facility] for tasks assessed/performed       Past Medical History:  Diagnosis Date  . Arthritis   . Asthma   . Chest pain    cath 2005 norm cors, repeat cath Feb 2011 normal cors and only minimally  elevated pulmonary pressures  . Crohn disease ([place])   . Diverticulosis   . GERD (gastroesophageal reflux disease)   . Hiatal hernia   . Hypertension   . Obesity   . Palpitations   . Sleep apnea sleep study 11/03/2009    cpap; does sometimes, doesnt use every night.    Past Surgical History:  Procedure Laterality Date  . ABDOMINAL HYSTERECTOMY    . ABDOMINAL HYSTERECTOMY    . ANKLE ARTHROSCOPY  06/01/2012   Procedure: ANKLE ARTHROSCOPY;  Surgeon: Colin Rhein, MD;  Location: [facility];  Service: Orthopedics;  Laterality: Left;  left ankle arthorsocpy with extensive debridement and gastroc slide  . CARDIAC CATHETERIZATION  11/22/2009 and 2005   WNL  . CESAREAN SECTION    . CHOLECYSTECTOMY  09/08/2006   lap. chole.  . CHONDROPLASTY  06/17/2012   Procedure: CHONDROPLASTY;  Surgeon: Carole Civil, MD;  [facility]: [facility];  Service: Orthopedics;  Laterality: Right;  right patella  . ESOPHAGOGASTRODUODENOSCOPY N/A 05/14/2015   Procedure: ESOPHAGOGASTRODUODENOSCOPY (EGD);  Surgeon: Rogene Houston, MD;  [facility];  Service: Endoscopy;  Laterality: N/A;  730  . INGUINAL HERNIA REPAIR  10/30/2008   right  . KNEE ARTHROSCOPY WITH LATERAL MENISECTOMY Left 12/23/2016   Procedure: LEFT KNEE ARTHROSCOPY WITH LATERAL MENISECTOMY;  Surgeon: Carole Civil, MD;  [facility]: [facility];  Service: Orthopedics;  Laterality: Left;  . KNEE ARTHROSCOPY WITH MEDIAL MENISECTOMY Left 12/23/2016   Procedure: LEFT KNEE ARTHROSCOPY WITH MEDIAL MENISECTOMY CHONDROPLASTY PATELLA  AND MEDIAL FEMORAL CONDYLE LEFT KNEE;  Surgeon: Carole Civil, MD;  Location: [facility];  Service: Orthopedics;  Laterality: Left;  . SHOULDER ARTHROSCOPY WITH SUBACROMIAL DECOMPRESSION AND BICEP TENDON REPAIR Left 12/21/2017   Procedure: Left shoulder arthroscopic biceps tenodesis, SAD, DCR and labrum debridement;  Surgeon: Nicholes Stairs, MD;  Location: [facility];  Service: Orthopedics;  Laterality: Left;  120 mins  . SHOULDER SURGERY     right x 2     There were no vitals filed for this visit.   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 Patient stated that this fall affected the whole left side of her body. She reported that she was having physical therapy at the beginning of this year to target her knee and ankle pain. Then patient reported that she had shoulder surgery for her left shoulder on December 21, 2017.  3 or 4 weeks after this she stated that her knee and ankle became swollen  from being on her feet more. Patient reported that sometime in early May she went and saw Dr. Doran Durand and he gave her an ankle brace to try and help with the alignment of her ankle. The patient reported that the pain in her ankle can get up to a 10/10 which usually occurs during weight bearing activities. Patient stated that her left foot can become tingly on the bottom. She stated that she has not had any unexpected weight loss or weight gain and denied any fevers.     Pertinent History  Fall on July 31, 2017 which affected her left side. Left shoulder surgery 12/21/17    Limitations  Standing;Walking;House hold activities;Lifting    How long can you sit comfortably?  Does not affect the ankle    How long can you stand comfortably?  3 minutes    How long can you walk comfortably?  3 minutes    Diagnostic tests  02/01/18: MR of Left ankle "Extra-articular subcortical reactive marrow changes along the lateral aspect of the talus and calcaneus as can be seen with lateral hindfoot impingement"    Patient Stated Goals  Strengthening that ankle because she feels like her ankle isn't supported    Currently in Pain?  Yes    Pain Score  4     Pain Location  Ankle    Pain Orientation  Left;Posterior;Lateral    Pain Descriptors / Indicators  Sharp    Pain Type  Chronic pain    Pain Onset  More than a month ago    Pain Frequency  Intermittent    Aggravating Factors   Standing, walking    Pain Relieving Factors  Ice    Effect of Pain on Daily Activities  Severely limiting    Multiple Pain Sites  No         02/22/18 0001  Assessment  Medical Diagnosis Unspecified disorder of synovium and tendon, unspecified lower leg  Referring Provider Wylene Simmer, MD  Onset Date/Surgical Date 07/31/17  Next MD Visit 03/02/18  Prior Therapy Ankle and knee therapy in February 2019  Precautions  Required Braces or Orthoses Other Brace/Splint (ankle brace for left. She says she can take off for therapy)   Restrictions  Weight Bearing Restrictions No  Balance Screen  Has the patient fallen in the past 6 months No (Possibly near falls)  Has the patient had a decrease in activity level because of a fear of falling?  Yes  Is the patient reluctant to leave their home because of a fear of falling?  Yes  [place] residence  Living Arrangements Spouse/significant other;Children  Type of [place] Access Level entry  [place] Two level  Alternate Level Stairs-Number of Steps 10  Alternate Level Stairs-Rails  Left (going up)  [place] - single point  Prior Function  Level of Independence Independent;Independent with basic ADLs  Vocation Full time employment (Now not working following shoulder surgery)  Vocation Requirements lots of walking and standing, reaching overhead   Leisure spending time with family  Cognition  Overall Cognitive Status Within Functional Limits for tasks assessed  Observation/Other Assessments  Focus on Therapeutic Outcomes (FOTO)  16% (84% limited)  Observation/Other Assessments-Edema   Edema  (Measure next session)  Sensation  Light Touch Impaired by gross assessment  Additional Comments Patient reported tingling into left sole of foot  AROM  Right Ankle Dorsiflexion 8  Right Ankle Plantar Flexion 65  Right Ankle Inversion 35  Right Ankle Eversion 27  Left Ankle Dorsiflexion 1  Left Ankle Plantar Flexion 35  Left Ankle Inversion 20  Left Ankle Eversion 12  Strength  Right/Left Ankle Right;Left  Right/Left Knee Right;Left  Right Knee Flexion 5/5  Right Knee Extension 5/5  Left Knee Flexion 4/5  Left Knee Extension 4/5  Right Ankle Dorsiflexion 5/5  Right Ankle Plantar Flexion 4/5  Right Ankle Inversion 5/5  Right Ankle Eversion 5/5  Left Ankle Dorsiflexion 4+/5  Left Ankle Plantar Flexion 2+/5  Left Ankle Inversion 4-/5 (painful)  Left Ankle Eversion 4-/5 (painful)  Palpation  Palpation  comment Patient reported pain with palpation around medial and lateral left ankle  Ambulation/Gait  Ambulation/Gait Yes  Ambulation/Gait Assistance 6: Modified independent (Device/Increase time)  Ambulation Distance (Feet) 296 Feet  Assistive device Other (Comment) (Left ankle brace)  Gait Pattern Antalgic;Decreased stance time - left;Decreased step length - right  Ambulation Surface Level  Gait velocity 0.75 m/s  Static Standing Balance  Static Standing - Balance Support No upper extremity supported  Static Standing - Level of Assistance 5: Stand by assistance  Static Standing Balance -  Activities  Single Leg Stance - Right Leg;Single Leg Stance - Left Leg  Static Standing - Comment/# of Minutes SLS on non-compliant surface: Rt. 9.23 seconds, Lt. 1.23 seconds              PT Education - 02/22/18 1640    Education provided  Yes    Education Details  Examination findings and plan of care    Person(s) Educated  Patient    Methods  Explanation    Comprehension  Verbalized understanding       PT Short Term Goals - 02/22/18 1603      PT SHORT TERM GOAL #1   Title  Pt will be independent with HEP and perform consistently in order to maximize return to PLOF.    Time  2    Period  Weeks    Status  New    Target Date  03/08/18      PT SHORT TERM GOAL #2   Title  Patient will demonstrate improvement in left ankle AROM of 5 degrees in all deficient planes to improve gait mechanics and safety.     Time  2    Period  Weeks    Status  New    Target Date  03/08/18      PT SHORT TERM GOAL #3   Title  Patient will demonstrate improvement of 1/2 MMT grade in all deficient musculature to assist with proper gait mechanics.     Time  2    Period  Weeks    Status  New    Target Date  03/08/18        PT  Long Term Goals - 02/22/18 1642      PT LONG TERM GOAL #1   Title  Patient will demonstrate improved SLS to 10 seconds on left lower extremity to demonstrate improved  stability and proprioception of left ankle and improve gait mechanics.    Time  4    Period  Weeks    Status  New    Target Date  03/25/18      PT LONG TERM GOAL #2   Title  Patient will demonstrate improvement of 1 MMT grade in all musculature tested as deficient at evaluation.     Time  4    Period  Weeks    Status  New    Target Date  03/25/18      PT LONG TERM GOAL #3   Title  Patient will demonstrate ability to ambulate at a gait velocity of 1.0 m/s on 2MWT indicating improved tolerance to ambulation and improved gait mechanics.     Time  4    Period  Weeks    Status  New    Target Date  03/25/18             Plan - 02/22/18 1622    Clinical Impression Statement  Patient is a 52 year old female who presented to physical therapy with complaints of left ankle pain following a fall on July 31, 2017. Upon examination noted decreased ankle ROM, decreased strength, and decreased balance. In addition, noted that patient's gait velocity was below that of her age-related norm and she demonstrated gait deviations including antalgic gait with ambulation. In addition patient's FOTO score was found to be 16% or 84% limited indicating that patient's perceived functional level is very low at this time. Patient also reported difficulty with walking or standing after 3 minutes. Patient would benefit from skilled physical therapy in order to address the abovementioned deficits and help patient return to her prior level of function.     History and Personal Factors relevant to plan of care:  Patient is allergic to essential oils and possibly lotion. Fall on July 31, 2017    Clinical Presentation  Stable    Clinical Presentation due to:  MMT, ROM, FOTO, 2MWT, and clinical judgement    Clinical Decision Making  Moderate    Rehab Potential  Fair    Clinical Impairments Affecting Rehab Potential  Positive: Motivated and positive attitude; Negative: Chronicity of issue    PT Frequency  2x /  week    PT Duration  4 weeks    PT Treatment/Interventions  ADLs/Self Care Home Management;Cryotherapy;Electrical Stimulation;DME Instruction;Gait training;Stair training;Functional mobility training;Therapeutic activities;Therapeutic exercise;Balance training;Neuromuscular re-education;Patient/family education;Manual techniques;Passive range of motion;Dry needling;Energy conservation;Splinting;Taping    PT Next Visit Plan  Review evaluation/goals, Figure 8 measurement, consider further special tests for ankle, initiate HEP including exercises for improved ankle mobility such as ABCs, isometrics for ankle strength. Physician order stated to continue strengthening for posterior tibial tendon. Manual for pain reduction and edema reduction as needed.     PT Home Exercise Plan  Initiate HEP first treatment session    Consulted and Agree with Plan of Care  Patient       Patient will benefit from skilled therapeutic intervention in order to improve the following deficits and impairments:  Abnormal gait, Decreased balance, Decreased endurance, Decreased mobility, Difficulty walking, Hypomobility, Impaired sensation, Decreased range of motion, Increased edema, Decreased activity tolerance, Decreased strength, Pain  Visit Diagnosis: Pain in left ankle and joints of left  foot  Muscle weakness (generalized)  Other abnormalities of gait and mobility     Problem List Patient Active Problem List   Diagnosis Date Noted  . Derangement of anterior horn of lateral meniscus of left knee   . Derangement of posterior horn of medial meniscus of left knee   . Chest pain 04/25/2016  . Hypokalemia 04/25/2016  . Hyperglycemia 04/25/2016  . Diverticulitis of colon 07/17/2014  . Nausea without vomiting 07/17/2014  . Crohn disease ([place]) 07/17/2014  . Diverticulitis of colon without hemorrhage 08/10/2013  . History of arthroscopy of right knee 07/25/2012  . Difficulty in walking(719.7) 06/29/2012  . Weakness of  right leg 06/29/2012  . Posterior tibial tendonitis 11/11/2011  . PTTD (posterior tibial tendon dysfunction) 11/11/2011  . Knee pain, right 11/11/2011  . Chest pain 07/16/2011  . Primary osteoarthritis of left knee 02/25/2010  . PAIN IN JOINT, MULTIPLE SITES 02/25/2010  . Essential hypertension 08/13/2009  . HIATAL HERNIA 08/13/2009  . PALPITATIONS 08/13/2009  . DYSPNEA 08/13/2009   Clarene Critchley PT, DPT 4:40 PM, 02/23/18 [place], [place], [address] Phone: [phone]   Fax:  [phone]  Name: MILAYA HORA MRN: [id number] Date of Birth: 1965-12-15

## 2018-02-23 NOTE — Therapy (Signed)
[place], [place], [address] Phone: [phone]   Fax:  [phone]  Occupational Therapy Treatment  Patient Details  Name: [name] MRN: [id number] Date of Birth: 1966-07-28 Referring Provider: Wylene Simmer, MD   Encounter Date: 02/23/2018  OT End of Session - 02/23/18 1406    Visit Number  14    Number of Visits  24    Date for OT Re-Evaluation  04/05/18 mini reassess: 03/21/18    Authorization Type  Workers compensation approved 16 visits for her shoulder. If needing more, we will need to request them.     Authorization - Visit Number  14    Authorization - Number of Visits  16    OT Start Time  1302    OT Stop Time  1345    OT Time Calculation (min)  43 min    Activity Tolerance  Patient tolerated treatment well    Behavior During Therapy  WFL for tasks assessed/performed       Past Medical History:  Diagnosis Date  . Arthritis   . Asthma   . Chest pain    cath 2005 norm cors, repeat cath Feb 2011 normal cors and only minimally  elevated pulmonary pressures  . Crohn disease ([place])   . Diverticulosis   . GERD (gastroesophageal reflux disease)   . Hiatal hernia   . Hypertension   . Obesity   . Palpitations   . Sleep apnea sleep study 11/03/2009    cpap; does sometimes, doesnt use every night.    Past Surgical History:  Procedure Laterality Date  . ABDOMINAL HYSTERECTOMY    . ABDOMINAL HYSTERECTOMY    . ANKLE ARTHROSCOPY  06/01/2012   Procedure: ANKLE ARTHROSCOPY;  Surgeon: Colin Rhein, MD;  Location: [facility];  Service: Orthopedics;  Laterality: Left;  left ankle arthorsocpy with extensive debridement and gastroc slide  . CARDIAC CATHETERIZATION  11/22/2009 and 2005   WNL  . CESAREAN SECTION    . CHOLECYSTECTOMY  09/08/2006   lap. chole.  . CHONDROPLASTY  06/17/2012   Procedure: CHONDROPLASTY;  Surgeon: Carole Civil, MD;  [facility];  Service: Orthopedics;   Laterality: Right;  right patella  . ESOPHAGOGASTRODUODENOSCOPY N/A 05/14/2015   Procedure: ESOPHAGOGASTRODUODENOSCOPY (EGD);  Surgeon: Rogene Houston, MD;  [facility];  Service: Endoscopy;  Laterality: N/A;  730  . INGUINAL HERNIA REPAIR  10/30/2008   right  . KNEE ARTHROSCOPY WITH LATERAL MENISECTOMY Left 12/23/2016   Procedure: LEFT KNEE ARTHROSCOPY WITH LATERAL MENISECTOMY;  Surgeon: Carole Civil, MD;  [facility];  Service: Orthopedics;  Laterality: Left;  . KNEE ARTHROSCOPY WITH MEDIAL MENISECTOMY Left 12/23/2016   Procedure: LEFT KNEE ARTHROSCOPY WITH MEDIAL MENISECTOMY CHONDROPLASTY PATELLA  AND MEDIAL FEMORAL CONDYLE LEFT KNEE;  Surgeon: Carole Civil, MD;  [facility];  Service: Orthopedics;  Laterality: Left;  . SHOULDER ARTHROSCOPY WITH SUBACROMIAL DECOMPRESSION AND BICEP TENDON REPAIR Left 12/21/2017   Procedure: Left shoulder arthroscopic biceps tenodesis, SAD, DCR and labrum debridement;  Surgeon: Nicholes Stairs, MD;  Location: [facility];  Service: Orthopedics;  Laterality: Left;  120 mins  . SHOULDER SURGERY     right x 2     There were no vitals filed for this visit.  Subjective Assessment - 02/23/18 1306    Subjective   S: The doctor is pleased with my progress.     Currently in Pain?  Yes  Pain Score  5     Pain Location  Shoulder    Pain Orientation  Left    Pain Descriptors / Indicators  Aching;Sore    Pain Type  Acute pain    Pain Radiating Towards  elbow    Pain Onset  More than a month ago    Pain Frequency  Intermittent    Aggravating Factors   movement, reaching overhead    Pain Relieving Factors  heat, pain medication    Effect of Pain on Daily Activities  mod effect on ADL completion    Multiple Pain Sites  No         [facility] OT Assessment - 02/23/18 1305      Assessment   Medical Diagnosis  Unspecified disorder of synovium and tendon, unspecified lower leg      Precautions   Precautions  Shoulder    Type of  Shoulder Precautions  Follow Standard SLAP repair protocol. No lifting over 5#      Observation/Other Assessments   Focus on Therapeutic Outcomes (FOTO)   51/100               OT Treatments/Exercises (OP) - 02/23/18 1306      Exercises   Exercises  Shoulder      Shoulder Exercises: Supine   Protraction  AROM;12 reps    Horizontal ABduction  AROM;12 reps    External Rotation  AROM;12 reps    Internal Rotation  AROM;12 reps    Flexion  AROM;12 reps    ABduction  AROM;12 reps      Shoulder Exercises: Seated   Extension  Theraband;10 reps    Theraband Level (Shoulder Extension)  Level 2 (Red)    Retraction  Theraband;10 reps    Theraband Level (Shoulder Retraction)  Level 2 (Red)    Row  Theraband;10 reps    Theraband Level (Shoulder Row)  Level 2 (Red)    Protraction  AROM;12 reps    Horizontal ABduction  AROM;12 reps    External Rotation  AROM;12 reps    Internal Rotation  AROM;12 reps    Flexion  AROM;12 reps    Abduction  AROM;12 reps      Shoulder Exercises: ROM/Strengthening   X to V Arms  10X    Proximal Shoulder Strengthening, Supine  12X with no rest breaks    Proximal Shoulder Strengthening, Seated  10X each no rest breaks      Manual Therapy   Manual Therapy  Myofascial release    Manual therapy comments  completed seperately from all other skilled interventions    Myofascial Release  myofascial release to left shoulder, trapezius, scapular, and upper arm region to decrease pain and improve pain free mobility.                OT Short Term Goals - 01/12/18 1423      OT SHORT TERM GOAL #1   Title  Patient will be educated and independent with HEP for left shoulder mobility for improved abiilty to return to normal work and daily tasks.     Time  6    Period  Weeks    Status  On-going      OT SHORT TERM GOAL #2   Title  patient will improve Left shoulder P/ROM to WNL to increase ability to complete dressing tasks with less difficulty.      Time  6    Period  Weeks    Status  On-going  OT SHORT TERM GOAL #3   Title  Patient will increase left shoulder strength to 3+/5 in order to complete lightweight household actiivities with LUE.     Time  6    Period  Weeks    Status  On-going      OT SHORT TERM GOAL #4   Title  Patient will decrease pain level in left shoulder to 5/10 with daily task completion.     Time  6    Period  Weeks    Status  On-going      OT SHORT TERM GOAL #5   Title  Patient will decrease fascial restrictions in LUE to mod amount in order to increase functional mobility needed to complete overhead and reaching tasks.     Time  6    Period  Weeks    Status  On-going        OT Long Term Goals - 01/12/18 1424      OT LONG TERM GOAL #1   Title  Patient will return to highest level of independence with her LUE in order to return to work and complete required tasks.     Time  12    Period  Weeks    Status  On-going      OT LONG TERM GOAL #2   Title  patient will increase A/ROM of LUE to WNL to increase ability to complete over head reaching tasks without difficulty.     Time  12    Period  Weeks    Status  On-going      OT LONG TERM GOAL #3   Title  Patient will increase LUE strength to 4+/5 to increase ability to complete chest compressions during CPR when needed.     Time  12    Period  Weeks    Status  On-going      OT LONG TERM GOAL #4   Title  Patient will decrease fascial restrictions to min amount or less in order to increase functional mobility needed for overhead and reaching tasks.     Time  12    Period  Weeks    Status  On-going      OT LONG TERM GOAL #5   Title  Patient will report a decreased level of pain of approximately 2/10 when completing work or daily tasks.     Time  12    Period  Weeks    Status  On-going            Plan - 02/23/18 1406    Clinical Impression Statement  A: Pt reports MD is pleased with her progress and HEP is going well. Continued with  manual therapy to address fascial restrictions limiting ROM. Continued with A/ROM and scapular theraband, pt able to complete proximal shoulder strengthening and x to v arms with improved form. Did not complete shoulder stretches due to time constraints. Verbal cuing for form and technique.     Plan  P: continue with manual technique working to reduce fascial restrictions. Resume shoulder stretches and attempt sidelying A/ROM for scapular stability       Patient will benefit from skilled therapeutic intervention in order to improve the following deficits and impairments:  Pain, Decreased range of motion, Impaired UE functional use, Increased fascial restrictions, Decreased strength, Impaired flexibility  Visit Diagnosis: Other symptoms and signs involving the musculoskeletal system  Acute pain of left shoulder  Stiffness of left shoulder, not elsewhere classified  Problem List Patient Active Problem List   Diagnosis Date Noted  . Derangement of anterior horn of lateral meniscus of left knee   . Derangement of posterior horn of medial meniscus of left knee   . Chest pain 04/25/2016  . Hypokalemia 04/25/2016  . Hyperglycemia 04/25/2016  . Diverticulitis of colon 07/17/2014  . Nausea without vomiting 07/17/2014  . Crohn disease ([place]) 07/17/2014  . Diverticulitis of colon without hemorrhage 08/10/2013  . History of arthroscopy of right knee 07/25/2012  . Difficulty in walking(719.7) 06/29/2012  . Weakness of right leg 06/29/2012  . Posterior tibial tendonitis 11/11/2011  . PTTD (posterior tibial tendon dysfunction) 11/11/2011  . Knee pain, right 11/11/2011  . Chest pain 07/16/2011  . Primary osteoarthritis of left knee 02/25/2010  . PAIN IN JOINT, MULTIPLE SITES 02/25/2010  . Essential hypertension 08/13/2009  . HIATAL HERNIA 08/13/2009  . PALPITATIONS 08/13/2009  . DYSPNEA 08/13/2009   Guadelupe Sabin, OTR/L  [phone] 02/23/2018, 2:11 PM  [facility] [address] [place], [place], [address] Phone: [phone]   Fax:  [phone]  Name: DARRYL BLUMENSTEIN MRN: [id number] Date of Birth: 05/07/1966

## 2018-02-24 ENCOUNTER — Encounter (HOSPITAL_COMMUNITY): Payer: Self-pay

## 2018-02-24 ENCOUNTER — Ambulatory Visit (HOSPITAL_COMMUNITY): Payer: PRIVATE HEALTH INSURANCE

## 2018-02-24 DIAGNOSIS — M25512 Pain in left shoulder: Secondary | ICD-10-CM | POA: Diagnosis not present

## 2018-02-24 DIAGNOSIS — M25572 Pain in left ankle and joints of left foot: Secondary | ICD-10-CM | POA: Diagnosis not present

## 2018-02-24 DIAGNOSIS — M6281 Muscle weakness (generalized): Secondary | ICD-10-CM

## 2018-02-24 DIAGNOSIS — R2689 Other abnormalities of gait and mobility: Secondary | ICD-10-CM

## 2018-02-24 DIAGNOSIS — R29898 Other symptoms and signs involving the musculoskeletal system: Secondary | ICD-10-CM | POA: Diagnosis not present

## 2018-02-24 DIAGNOSIS — M25612 Stiffness of left shoulder, not elsewhere classified: Secondary | ICD-10-CM | POA: Diagnosis not present

## 2018-02-24 NOTE — Therapy (Signed)
[place], [place], [address] Phone: [phone]   Fax:  [phone]  Physical Therapy Treatment  Patient Details  Name: [name] MRN: [id number] Date of Birth: Feb 19, 1966 Referring Provider: Wylene Simmer, MD   Encounter Date: 02/24/2018  PT End of Session - 02/24/18 0945    Visit Number  2    Number of Visits  8    Date for PT Re-Evaluation  03/25/18    Authorization Type  Workman's Compensation (Approved for 8 visits)     Authorization Time Period  02/22/18- 03/25/18    Authorization - Visit Number  2    Authorization - Number of Visits  8    PT Start Time  0945    PT Stop Time  1031    PT Time Calculation (min)  46 min    Equipment Utilized During Treatment  Other (comment) Left ankle brace    Activity Tolerance  Patient limited by pain;Patient tolerated treatment well    Behavior During Therapy  [facility] for tasks assessed/performed       Past Medical History:  Diagnosis Date  . Arthritis   . Asthma   . Chest pain    cath 2005 norm cors, repeat cath Feb 2011 normal cors and only minimally  elevated pulmonary pressures  . Crohn disease ([place])   . Diverticulosis   . GERD (gastroesophageal reflux disease)   . Hiatal hernia   . Hypertension   . Obesity   . Palpitations   . Sleep apnea sleep study 11/03/2009    cpap; does sometimes, doesnt use every night.    Past Surgical History:  Procedure Laterality Date  . ABDOMINAL HYSTERECTOMY    . ABDOMINAL HYSTERECTOMY    . ANKLE ARTHROSCOPY  06/01/2012   Procedure: ANKLE ARTHROSCOPY;  Surgeon: Colin Rhein, MD;  Location: [place];  Service: Orthopedics;  Laterality: Left;  left ankle arthorsocpy with extensive debridement and gastroc slide  . CARDIAC CATHETERIZATION  11/22/2009 and 2005   WNL  . CESAREAN SECTION    . CHOLECYSTECTOMY  09/08/2006   lap. chole.  . CHONDROPLASTY  06/17/2012   Procedure: CHONDROPLASTY;  Surgeon: Carole Civil, MD;  [facility];  Service: Orthopedics;  Laterality: Right;  right patella  . ESOPHAGOGASTRODUODENOSCOPY N/A 05/14/2015   Procedure: ESOPHAGOGASTRODUODENOSCOPY (EGD);  Surgeon: Rogene Houston, MD;  [facility];  Service: Endoscopy;  Laterality: N/A;  730  . INGUINAL HERNIA REPAIR  10/30/2008   right  . KNEE ARTHROSCOPY WITH LATERAL MENISECTOMY Left 12/23/2016   Procedure: LEFT KNEE ARTHROSCOPY WITH LATERAL MENISECTOMY;  Surgeon: Carole Civil, MD;  [facility];  Service: Orthopedics;  Laterality: Left;  . KNEE ARTHROSCOPY WITH MEDIAL MENISECTOMY Left 12/23/2016   Procedure: LEFT KNEE ARTHROSCOPY WITH MEDIAL MENISECTOMY CHONDROPLASTY PATELLA  AND MEDIAL FEMORAL CONDYLE LEFT KNEE;  Surgeon: Carole Civil, MD;  [facility];  Service: Orthopedics;  Laterality: Left;  . SHOULDER ARTHROSCOPY WITH SUBACROMIAL DECOMPRESSION AND BICEP TENDON REPAIR Left 12/21/2017   Procedure: Left shoulder arthroscopic biceps tenodesis, SAD, DCR and labrum debridement;  Surgeon: Nicholes Stairs, MD;  Location: [place];  Service: Orthopedics;  Laterality: Left;  120 mins  . SHOULDER SURGERY     right x 2     There were no vitals filed for this visit.  Subjective Assessment - 02/24/18 0945    Subjective  Pt states that her L ankle is still painful  and that bearing weight on it is terrible. She states that it feels unstable.     Pertinent History  Fall on July 31, 2017 which affected her left side. Left shoulder surgery 12/21/17    Limitations  Standing;Walking;House hold activities;Lifting    How long can you sit comfortably?  Does not affect the ankle    How long can you stand comfortably?  3 minutes    How long can you walk comfortably?  3 minutes    Diagnostic tests  02/01/18: MR of Left ankle "Extra-articular subcortical reactive marrow changes along the lateral aspect of the talus and calcaneus as can be seen with lateral hindfoot impingement"    Patient Stated  Goals  Strengthening that ankle because she feels like her ankle isn't supported    Currently in Pain?  Yes    Pain Score  3     Pain Location  Ankle    Pain Orientation  Left    Pain Descriptors / Indicators  Aching;Sore    Pain Type  Acute pain;Chronic pain    Pain Onset  More than a month ago    Pain Frequency  Constant    Aggravating Factors   WB activities    Pain Relieving Factors  rest, NWB    Effect of Pain on Daily Activities  increases         [facility] PT Assessment - 02/24/18 0001      Observation/Other Assessments   Observations  significant calcaneal eversion of L ankle in WB      Observation/Other Assessments-Edema    Edema  Figure 8      Figure 8 Edema   Figure 8 - Right   20.75 inches    Figure 8 - Left   21 inches      Special Tests    Special Tests  Ankle/Foot Special Tests    Ankle/Foot Special Tests   Anterior Drawer Test;Talar Tilt Test;Tinel's Test - post tibialis      Anterior Drawer Test   Findings  Negative    Side   Left      Talar Tilt Test    Findings  Negative    Side   Left    Comments  general increased stiffness with L ankle compared to R, but not lax      Tinel's test - Post Tibialis    Findings  Negative    Side  Left           [facility] Adult PT Treatment/Exercise - 02/24/18 0001      Exercises   Exercises  Ankle      Manual Therapy   Manual Therapy  Joint mobilization;Soft tissue mobilization    Manual therapy comments  completed seperately from all other skilled interventions    Joint Mobilization  L ankle DF/AP joint mobs, Grade I-II for pain control    Soft tissue mobilization  light STM to L lateral ankle for pain control      Ankle Exercises: Stretches   Other Stretch  seated DF stretch on towel multiple reps holding for 5-10" for 2 mins total      Ankle Exercises: Seated   ABC's  1 rep    Towel Inversion/Eversion Limitations  x2 mins    Heel Raises  Both;20 reps    Toe Raise  20 reps    Toe Raise Limitations  both            PT Education - 02/24/18 0945  Education provided  Yes    Education Details  reviewed goals and HEP; exercise technique    Person(s) Educated  Patient    Methods  Demonstration;Explanation;Handout    Comprehension  Verbalized understanding;Returned demonstration       PT Short Term Goals - 02/22/18 1603      PT SHORT TERM GOAL #1   Title  Pt will be independent with HEP and perform consistently in order to maximize return to PLOF.    Time  2    Period  Weeks    Status  New    Target Date  03/08/18      PT SHORT TERM GOAL #2   Title  Patient will demonstrate improvement in left ankle AROM of 5 degrees in all deficient planes to improve gait mechanics and safety.     Time  2    Period  Weeks    Status  New    Target Date  03/08/18      PT SHORT TERM GOAL #3   Title  Patient will demonstrate improvement of 1/2 MMT grade in all deficient musculature to assist with proper gait mechanics.     Time  2    Period  Weeks    Status  New    Target Date  03/08/18        PT Long Term Goals - 02/22/18 1642      PT LONG TERM GOAL #1   Title  Patient will demonstrate improved SLS to 10 seconds on left lower extremity to demonstrate improved stability and proprioception of left ankle and improve gait mechanics.    Time  4    Period  Weeks    Status  New    Target Date  03/25/18      PT LONG TERM GOAL #2   Title  Patient will demonstrate improvement of 1 MMT grade in all musculature tested as deficient at evaluation.     Time  4    Period  Weeks    Status  New    Target Date  03/25/18      PT LONG TERM GOAL #3   Title  Patient will demonstrate ability to ambulate at a gait velocity of 1.0 m/s on 2MWT indicating improved tolerance to ambulation and improved gait mechanics.     Time  4    Period  Weeks    Status  New    Target Date  03/25/18            Plan - 02/24/18 1210    Clinical Impression Statement  PT completed objective testing this date with  figure 8 edema measurements and instability testing. Pt negative for anterior drawer and talar tilt as her ankle was not lax during testing, but she was noted to have increased stiffness with L talar tilting testing compared to the R. Began ROM activities this date with seated ankle DF and inv/ev towel slides and added ABCs to HEP. PT began grade I-II AP talocrural joint mobs and L ankle long axis distraction for improved L ankle DF and pain control. Ended with very light STM to lateral ankle for pain control and she did report slight decrease in pain afterwards. PT noted when pt was standing without her ankle brace on, her L calcaneus was noted to be significantly everted compared to the R; this calcaneal eversion is causing increased joint space at her medial malleolus and decreased joint space at her lateral malleolus, which could be contributing  to her impingement/pain. This PT intends to discuss with the evaluating therapist about potentially reaching out to an orthotist about getting pt a foot orthosis to correct the calcaneal eversion. Orthotic Fit/Training not in original POC so this PT added and resent cert to referring MD.    Rehab Potential  Fair    Clinical Impairments Affecting Rehab Potential  Positive: Motivated and positive attitude; Negative: Chronicity of issue    PT Frequency  2x / week    PT Duration  4 weeks    PT Treatment/Interventions  ADLs/Self Care Home Management;Cryotherapy;Electrical Stimulation;DME Instruction;Gait training;Stair training;Functional mobility training;Therapeutic activities;Therapeutic exercise;Balance training;Neuromuscular re-education;Patient/family education;Manual techniques;Passive range of motion;Dry needling;Energy conservation;Splinting;Taping;Orthotic Fit/Training;Ultrasound    PT Next Visit Plan  Pt is allergic to essential oils and very strong smelling lotions but she is fine with the massage cream/oil used in clinic; begin isometrics for ankle  strength. Physician order stated to continue strengthening for posterior tibial tendon. Manual for pain reduction and edema reduction as needed. continue to try and correct calcaneal eversion    PT Home Exercise Plan  5/23: ABCs    Consulted and Agree with Plan of Care  Patient       Patient will benefit from skilled therapeutic intervention in order to improve the following deficits and impairments:  Abnormal gait, Decreased balance, Decreased endurance, Decreased mobility, Difficulty walking, Hypomobility, Impaired sensation, Decreased range of motion, Increased edema, Decreased activity tolerance, Decreased strength, Pain  Visit Diagnosis: Pain in left ankle and joints of left foot - Plan: PT plan of care cert/re-cert  Muscle weakness (generalized) - Plan: PT plan of care cert/re-cert  Other abnormalities of gait and mobility - Plan: PT plan of care cert/re-cert     Problem List Patient Active Problem List   Diagnosis Date Noted  . Derangement of anterior horn of lateral meniscus of left knee   . Derangement of posterior horn of medial meniscus of left knee   . Chest pain 04/25/2016  . Hypokalemia 04/25/2016  . Hyperglycemia 04/25/2016  . Diverticulitis of colon 07/17/2014  . Nausea without vomiting 07/17/2014  . Crohn disease ([place]) 07/17/2014  . Diverticulitis of colon without hemorrhage 08/10/2013  . History of arthroscopy of right knee 07/25/2012  . Difficulty in walking(719.7) 06/29/2012  . Weakness of right leg 06/29/2012  . Posterior tibial tendonitis 11/11/2011  . PTTD (posterior tibial tendon dysfunction) 11/11/2011  . Knee pain, right 11/11/2011  . Chest pain 07/16/2011  . Primary osteoarthritis of left knee 02/25/2010  . PAIN IN JOINT, MULTIPLE SITES 02/25/2010  . Essential hypertension 08/13/2009  . HIATAL HERNIA 08/13/2009  . PALPITATIONS 08/13/2009  . DYSPNEA 08/13/2009        Geraldine Solar PT, DPT  [facility] [address] [place], [place], [address] Phone: [phone]   Fax:  [phone]  Name: [name] MRN: [id number] Date of Birth: 28-Oct-1965

## 2018-03-01 ENCOUNTER — Ambulatory Visit (HOSPITAL_COMMUNITY): Payer: PRIVATE HEALTH INSURANCE | Admitting: Physical Therapy

## 2018-03-01 ENCOUNTER — Encounter (HOSPITAL_COMMUNITY): Payer: Self-pay | Admitting: Physical Therapy

## 2018-03-01 DIAGNOSIS — M25572 Pain in left ankle and joints of left foot: Secondary | ICD-10-CM | POA: Diagnosis not present

## 2018-03-01 DIAGNOSIS — R2689 Other abnormalities of gait and mobility: Secondary | ICD-10-CM | POA: Diagnosis not present

## 2018-03-01 DIAGNOSIS — M6281 Muscle weakness (generalized): Secondary | ICD-10-CM

## 2018-03-01 DIAGNOSIS — M25612 Stiffness of left shoulder, not elsewhere classified: Secondary | ICD-10-CM | POA: Diagnosis not present

## 2018-03-01 DIAGNOSIS — M25512 Pain in left shoulder: Secondary | ICD-10-CM | POA: Diagnosis not present

## 2018-03-01 DIAGNOSIS — R29898 Other symptoms and signs involving the musculoskeletal system: Secondary | ICD-10-CM | POA: Diagnosis not present

## 2018-03-01 NOTE — Therapy (Signed)
[facility] [place], [place], [address] Phone: [phone]   Fax:  [phone]  Physical Therapy Treatment  Patient Details  Name: [name] MRN: [id number] Date of Birth: [birth year] Referring Provider: Wylene Simmer, MD   Encounter Date: 03/01/2018  PT End of Session - 03/01/18 1459    Visit Number  3    Number of Visits  8    Date for PT Re-Evaluation  03/25/18    Authorization Type  Workman's Compensation (Approved for 8 visits)     Authorization Time Period  02/22/18- 03/25/18    Authorization - Visit Number  3    Authorization - Number of Visits  8    PT Start Time  1350    PT Stop Time  1436    PT Time Calculation (min)  46 min    Equipment Utilized During Treatment  Other (comment) Left ankle brace    Activity Tolerance  Patient limited by pain;Patient tolerated treatment well    Behavior During Therapy  [facility] for tasks assessed/performed       Past Medical History:  Diagnosis Date  . Arthritis   . Asthma   . Chest pain    cath 2005 norm cors, repeat cath Feb 2011 normal cors and only minimally  elevated pulmonary pressures  . Crohn disease ([place])   . Diverticulosis   . GERD (gastroesophageal reflux disease)   . Hiatal hernia   . Hypertension   . Obesity   . Palpitations   . Sleep apnea sleep study 11/03/2009    cpap; does sometimes, doesnt use every night.    Past Surgical History:  Procedure Laterality Date  . ABDOMINAL HYSTERECTOMY    . ABDOMINAL HYSTERECTOMY    . ANKLE ARTHROSCOPY  06/01/2012   Procedure: ANKLE ARTHROSCOPY;  Surgeon: Colin Rhein, MD;  Location: [facility] [place];  Service: Orthopedics;  Laterality: Left;  left ankle arthorsocpy with extensive debridement and gastroc slide  . CARDIAC CATHETERIZATION  11/22/2009 and 2005   WNL  . CESAREAN SECTION    . CHOLECYSTECTOMY  09/08/2006   lap. chole.  . CHONDROPLASTY  06/17/2012   Procedure: CHONDROPLASTY;  Surgeon: Carole Civil, MD;  [facility];  Service: Orthopedics;  Laterality: Right;  right patella  . ESOPHAGOGASTRODUODENOSCOPY N/A 05/14/2015   Procedure: ESOPHAGOGASTRODUODENOSCOPY (EGD);  Surgeon: Rogene Houston, MD;  [facility];  Service: Endoscopy;  Laterality: N/A;  730  . INGUINAL HERNIA REPAIR  10/30/2008   right  . KNEE ARTHROSCOPY WITH LATERAL MENISECTOMY Left 12/23/2016   Procedure: LEFT KNEE ARTHROSCOPY WITH LATERAL MENISECTOMY;  Surgeon: Carole Civil, MD;  [facility];  Service: Orthopedics;  Laterality: Left;  . KNEE ARTHROSCOPY WITH MEDIAL MENISECTOMY Left 12/23/2016   Procedure: LEFT KNEE ARTHROSCOPY WITH MEDIAL MENISECTOMY CHONDROPLASTY PATELLA  AND MEDIAL FEMORAL CONDYLE LEFT KNEE;  Surgeon: Carole Civil, MD;  [facility];  Service: Orthopedics;  Laterality: Left;  . SHOULDER ARTHROSCOPY WITH SUBACROMIAL DECOMPRESSION AND BICEP TENDON REPAIR Left 12/21/2017   Procedure: Left shoulder arthroscopic biceps tenodesis, SAD, DCR and labrum debridement;  Surgeon: Nicholes Stairs, MD;  Location: [facility];  Service: Orthopedics;  Laterality: Left;  120 mins  . SHOULDER SURGERY     right x 2     There were no vitals filed for this visit.  Subjective Assessment - 03/01/18 1404    Subjective  Patient stated that her pain is worst with standing  and walking.     Pertinent History  Fall on July 31, 2017 which affected her left side. Left shoulder surgery 12/21/17    Limitations  Standing;Walking;House hold activities;Lifting    How long can you sit comfortably?  Does not affect the ankle    How long can you stand comfortably?  3 minutes    How long can you walk comfortably?  3 minutes    Diagnostic tests  02/01/18: MR of Left ankle "Extra-articular subcortical reactive marrow changes along the lateral aspect of the talus and calcaneus as can be seen with lateral hindfoot impingement"    Patient Stated Goals  Strengthening that ankle because she feels like her  ankle isn't supported    Currently in Pain?  Yes    Pain Score  5     Pain Location  Ankle    Pain Orientation  Left    Pain Descriptors / Indicators  Aching;Sore    Pain Type  Chronic pain    Pain Onset  More than a month ago         [facility] PT Assessment - 03/01/18 0001      Observation/Other Assessments   Observations  significant calcaneal eversion of L ankle in WB                   [facility] Adult PT Treatment/Exercise - 03/01/18 0001      Ambulation/Gait   Ambulation/Gait  --    Ambulation Distance (Feet)  --    Gait Comments  --      Manual Therapy   Manual Therapy  Joint mobilization;Soft tissue mobilization    Manual therapy comments  completed seperately from all other skilled interventions    Joint Mobilization  Lt. posterior glide to talocrural joint, Grade I-II for pain control and to improve ankle DF. Gentle distraction of calcaneus.     Soft tissue mobilization  light STM to Lt. lateral ankle for pain control      Ankle Exercises: Stretches   Other Stretch  seated DF stretch on towel multiple reps holding for 5-10" for 2 mins total      Ankle Exercises: Seated   ABC's  1 rep    Towel Inversion/Eversion Limitations  x2 mins each inversion/eversion    Heel Raises  Both;20 reps    Toe Raise  20 reps left    Other Seated Ankle Exercises  Ankle inversion/eversion isometrics with pink ball into wall 10 x 10 second holds             PT Education - 03/01/18 1458    Education provided  Yes    Education Details  Patient was educated on process for getting an orthotic as well as on purpose and technique of interventions throughout session.     Person(s) Educated  Patient    Methods  Explanation;Verbal cues;Tactile cues    Comprehension  Verbalized understanding;Returned demonstration       PT Short Term Goals - 02/22/18 1603      PT SHORT TERM GOAL #1   Title  Pt will be independent with HEP and perform consistently in order to maximize return to  PLOF.    Time  2    Period  Weeks    Status  New    Target Date  03/08/18      PT SHORT TERM GOAL #2   Title  Patient will demonstrate improvement in left ankle AROM of 5 degrees in all deficient planes to  improve gait mechanics and safety.     Time  2    Period  Weeks    Status  New    Target Date  03/08/18      PT SHORT TERM GOAL #3   Title  Patient will demonstrate improvement of 1/2 MMT grade in all deficient musculature to assist with proper gait mechanics.     Time  2    Period  Weeks    Status  New    Target Date  03/08/18        PT Long Term Goals - 02/22/18 1642      PT LONG TERM GOAL #1   Title  Patient will demonstrate improved SLS to 10 seconds on left lower extremity to demonstrate improved stability and proprioception of left ankle and improve gait mechanics.    Time  4    Period  Weeks    Status  New    Target Date  03/25/18      PT LONG TERM GOAL #2   Title  Patient will demonstrate improvement of 1 MMT grade in all musculature tested as deficient at evaluation.     Time  4    Period  Weeks    Status  New    Target Date  03/25/18      PT LONG TERM GOAL #3   Title  Patient will demonstrate ability to ambulate at a gait velocity of 1.0 m/s on 2MWT indicating improved tolerance to ambulation and improved gait mechanics.     Time  4    Period  Weeks    Status  New    Target Date  03/25/18            Plan - 03/01/18 1501    Clinical Impression Statement  This session continued with established plan of care. This session added ankle isometrics for strengthening with reduced pain. Patient was educated on the process of getting an orthotic. This session ended with therapist providing manual therapy to patient's left ankle for improved mobility and for pain reduction. Noted edema around patient's left lateral malleolus. Patient would benefit from continued skilled physical therapy in order to continue progression toward goals.    Rehab Potential  Fair     Clinical Impairments Affecting Rehab Potential  Positive: Motivated and positive attitude; Negative: Chronicity of issue    PT Frequency  2x / week    PT Duration  4 weeks    PT Treatment/Interventions  ADLs/Self Care Home Management;Cryotherapy;Electrical Stimulation;DME Instruction;Gait training;Stair training;Functional mobility training;Therapeutic activities;Therapeutic exercise;Balance training;Neuromuscular re-education;Patient/family education;Manual techniques;Passive range of motion;Dry needling;Energy conservation;Splinting;Taping;Orthotic Fit/Training;Ultrasound    PT Next Visit Plan  Pt is allergic to essential oils and very strong smelling lotions but she is fine with the massage cream/oil used in clinic; continue isometrics for ankle strength. Physician order stated to continue strengthening for posterior tibial tendon. Manual for pain reduction and edema reduction as needed. continue to try and correct calcaneal eversion. Follow-up on referral from physician about orthotics.     PT Home Exercise Plan  5/23: ABCs    Consulted and Agree with Plan of Care  Patient       Patient will benefit from skilled therapeutic intervention in order to improve the following deficits and impairments:  Abnormal gait, Decreased balance, Decreased endurance, Decreased mobility, Difficulty walking, Hypomobility, Impaired sensation, Decreased range of motion, Increased edema, Decreased activity tolerance, Decreased strength, Pain  Visit Diagnosis: Pain in left ankle and joints of left foot  Muscle weakness (generalized)  Other abnormalities of gait and mobility     Problem List Patient Active Problem List   Diagnosis Date Noted  . Derangement of anterior horn of lateral meniscus of left knee   . Derangement of posterior horn of medial meniscus of left knee   . Chest pain 04/25/2016  . Hypokalemia 04/25/2016  . Hyperglycemia 04/25/2016  . Diverticulitis of colon 07/17/2014  . Nausea without  vomiting 07/17/2014  . Crohn disease ([place]) 07/17/2014  . Diverticulitis of colon without hemorrhage 08/10/2013  . History of arthroscopy of right knee 07/25/2012  . Difficulty in walking(719.7) 06/29/2012  . Weakness of right leg 06/29/2012  . Posterior tibial tendonitis 11/11/2011  . PTTD (posterior tibial tendon dysfunction) 11/11/2011  . Knee pain, right 11/11/2011  . Chest pain 07/16/2011  . Primary osteoarthritis of left knee 02/25/2010  . PAIN IN JOINT, MULTIPLE SITES 02/25/2010  . Essential hypertension 08/13/2009  . HIATAL HERNIA 08/13/2009  . PALPITATIONS 08/13/2009  . DYSPNEA 08/13/2009   [name] PT, DPT 3:18 PM, 03/01/18 [place], [place], [address] Phone: [phone]   Fax:  [phone]  Name: [name] MRN: [id number] Date of Birth: January 31, 1966

## 2018-03-03 ENCOUNTER — Encounter (HOSPITAL_COMMUNITY): Payer: Self-pay

## 2018-03-03 ENCOUNTER — Ambulatory Visit (HOSPITAL_COMMUNITY): Payer: PRIVATE HEALTH INSURANCE

## 2018-03-03 DIAGNOSIS — M6281 Muscle weakness (generalized): Secondary | ICD-10-CM | POA: Diagnosis not present

## 2018-03-03 DIAGNOSIS — M25512 Pain in left shoulder: Secondary | ICD-10-CM | POA: Diagnosis not present

## 2018-03-03 DIAGNOSIS — M25572 Pain in left ankle and joints of left foot: Secondary | ICD-10-CM

## 2018-03-03 DIAGNOSIS — R2689 Other abnormalities of gait and mobility: Secondary | ICD-10-CM | POA: Diagnosis not present

## 2018-03-03 DIAGNOSIS — R29898 Other symptoms and signs involving the musculoskeletal system: Secondary | ICD-10-CM | POA: Diagnosis not present

## 2018-03-03 DIAGNOSIS — M25612 Stiffness of left shoulder, not elsewhere classified: Secondary | ICD-10-CM | POA: Diagnosis not present

## 2018-03-03 NOTE — Therapy (Signed)
[facility] [address] [place], [place], [address] Phone: [phone]   Fax:  [phone]  Physical Therapy Treatment  Patient Details  Name: [name] MRN: [id number] Date of Birth: 09-29-1966 Referring Provider: Wylene Simmer, MD   Encounter Date: 03/03/2018  PT End of Session - 03/03/18 1046    Visit Number  4    Number of Visits  8    Date for PT Re-Evaluation  03/25/18    Authorization Type  Workman's Compensation (Approved for 8 visits)     Authorization Time Period  02/22/18- 03/25/18    Authorization - Visit Number  4    Authorization - Number of Visits  8    PT Start Time  8676    PT Stop Time  7209    PT Time Calculation (min)  40 min    Equipment Utilized During Treatment  -- Left ankle brace    Activity Tolerance  Patient limited by pain;Patient tolerated treatment well    Behavior During Therapy  [facility] for tasks assessed/performed       Past Medical History:  Diagnosis Date  . Arthritis   . Asthma   . Chest pain    cath 2005 norm cors, repeat cath Feb 2011 normal cors and only minimally  elevated pulmonary pressures  . Crohn disease ([place])   . Diverticulosis   . GERD (gastroesophageal reflux disease)   . Hiatal hernia   . Hypertension   . Obesity   . Palpitations   . Sleep apnea sleep study 11/03/2009    cpap; does sometimes, doesnt use every night.    Past Surgical History:  Procedure Laterality Date  . ABDOMINAL HYSTERECTOMY    . ABDOMINAL HYSTERECTOMY    . ANKLE ARTHROSCOPY  06/01/2012   Procedure: ANKLE ARTHROSCOPY;  Surgeon: Colin Rhein, MD;  Location: [facility];  Service: Orthopedics;  Laterality: Left;  left ankle arthorsocpy with extensive debridement and gastroc slide  . CARDIAC CATHETERIZATION  11/22/2009 and 2005   WNL  . CESAREAN SECTION    . CHOLECYSTECTOMY  09/08/2006   lap. chole.  . CHONDROPLASTY  06/17/2012   Procedure: CHONDROPLASTY;  Surgeon: Carole Civil, MD;   [facility];  Service: Orthopedics;  Laterality: Right;  right patella  . ESOPHAGOGASTRODUODENOSCOPY N/A 05/14/2015   Procedure: ESOPHAGOGASTRODUODENOSCOPY (EGD);  Surgeon: Rogene Houston, MD;  [facility];  Service: Endoscopy;  Laterality: N/A;  730  . INGUINAL HERNIA REPAIR  10/30/2008   right  . KNEE ARTHROSCOPY WITH LATERAL MENISECTOMY Left 12/23/2016   Procedure: LEFT KNEE ARTHROSCOPY WITH LATERAL MENISECTOMY;  Surgeon: Carole Civil, MD;  [facility];  Service: Orthopedics;  Laterality: Left;  . KNEE ARTHROSCOPY WITH MEDIAL MENISECTOMY Left 12/23/2016   Procedure: LEFT KNEE ARTHROSCOPY WITH MEDIAL MENISECTOMY CHONDROPLASTY PATELLA  AND MEDIAL FEMORAL CONDYLE LEFT KNEE;  Surgeon: Carole Civil, MD;  [facility];  Service: Orthopedics;  Laterality: Left;  . SHOULDER ARTHROSCOPY WITH SUBACROMIAL DECOMPRESSION AND BICEP TENDON REPAIR Left 12/21/2017   Procedure: Left shoulder arthroscopic biceps tenodesis, SAD, DCR and labrum debridement;  Surgeon: Nicholes Stairs, MD;  Location: [facility];  Service: Orthopedics;  Laterality: Left;  120 mins  . SHOULDER SURGERY     right x 2     There were no vitals filed for this visit.  Subjective Assessment - 03/03/18 1041    Subjective  Pt reports visit to MD yesterday encouraged to continue PT,  did state change to more supportive brace to receive in the mail and possibly looking into surgery.    Pertinent History  Fall on July 31, 2017 which affected her left side. Left shoulder surgery 12/21/17    Patient Stated Goals  Strengthening that ankle because she feels like her ankle isn't supported    Currently in Pain?  Yes    Pain Score  8     Pain Location  Ankle    Pain Orientation  Left    Pain Descriptors / Indicators  Sharp;Aching    Pain Type  Chronic pain    Pain Onset  More than a month ago    Pain Frequency  Constant    Aggravating Factors   weight bearing    Pain Relieving Factors  rest, NWB    Effect of  Pain on Daily Activities  increases         [facility] PT Assessment - 03/03/18 0001      Assessment   Medical Diagnosis  Unspecified disorder of synovium and tendon, unspecified lower leg    Referring Provider  Wylene Simmer, MD    Onset Date/Surgical Date  07/31/17    Next MD Visit  6/18                   [facility] Adult PT Treatment/Exercise - 03/03/18 0001      Manual Therapy   Manual Therapy  Soft tissue mobilization    Manual therapy comments  completed seperately from all other skilled interventions    Soft tissue mobilization  light STM to Lt. lateral ankle for pain control      Ankle Exercises: Seated   Towel Crunch  2 reps    Towel Inversion/Eversion Limitations  5 reps inversion wiht extra support under arch; 2x eversion     Heel Raises  Both;20 reps    Toe Raise  20 reps    Other Seated Ankle Exercises  Ankle inversion/eversion isometrics with pink ball into wall 10 x 10 second holds    Other Seated Ankle Exercises  arch formation 10x5"      Ankle Exercises: Stretches   Other Stretch  seated DF stretch on towel multiple reps holding for 5-10" for 2 mins total               PT Short Term Goals - 02/22/18 1603      PT SHORT TERM GOAL #1   Title  Pt will be independent with HEP and perform consistently in order to maximize return to PLOF.    Time  2    Period  Weeks    Status  New    Target Date  03/08/18      PT SHORT TERM GOAL #2   Title  Patient will demonstrate improvement in left ankle AROM of 5 degrees in all deficient planes to improve gait mechanics and safety.     Time  2    Period  Weeks    Status  New    Target Date  03/08/18      PT SHORT TERM GOAL #3   Title  Patient will demonstrate improvement of 1/2 MMT grade in all deficient musculature to assist with proper gait mechanics.     Time  2    Period  Weeks    Status  New    Target Date  03/08/18        PT Long Term Goals - 02/22/18 1642  PT LONG TERM GOAL #1   Title   Patient will demonstrate improved SLS to 10 seconds on left lower extremity to demonstrate improved stability and proprioception of left ankle and improve gait mechanics.    Time  4    Period  Weeks    Status  New    Target Date  03/25/18      PT LONG TERM GOAL #2   Title  Patient will demonstrate improvement of 1 MMT grade in all musculature tested as deficient at evaluation.     Time  4    Period  Weeks    Status  New    Target Date  03/25/18      PT LONG TERM GOAL #3   Title  Patient will demonstrate ability to ambulate at a gait velocity of 1.0 m/s on 2MWT indicating improved tolerance to ambulation and improved gait mechanics.     Time  4    Period  Weeks    Status  New    Target Date  03/25/18            Plan - 03/03/18 1301    Clinical Impression Statement  Continued session focus with strengthening and manual techniques to assist with pain.  Added arch formation and continues with posterior tib strengthening.  Pt limited by pain with weight bearing activities, reports she is waiting on more structured support brace following last MD apt.  EOS with manual technqiues to assist with pain, pt does continues to have edema present lateral malleolus.      Rehab Potential  Fair    Clinical Impairments Affecting Rehab Potential  Positive: Motivated and positive attitude; Negative: Chronicity of issue    PT Frequency  2x / week    PT Duration  4 weeks    PT Treatment/Interventions  ADLs/Self Care Home Management;Cryotherapy;Electrical Stimulation;DME Instruction;Gait training;Stair training;Functional mobility training;Therapeutic activities;Therapeutic exercise;Balance training;Neuromuscular re-education;Patient/family education;Manual techniques;Passive range of motion;Dry needling;Energy conservation;Splinting;Taping;Orthotic Fit/Training;Ultrasound    PT Next Visit Plan  Pt is allergic to essential oils and very strong smelling lotions but she is fine with the massage cream/oil  used in clinic; continue isometrics for ankle strength. Physician order stated to continue strengthening for posterior tibial tendon. Manual for pain reduction and edema reduction as needed. continue to try and correct calcaneal eversion. Follow-up on referral from physician about orthotics.     PT Home Exercise Plan  5/23: ABCs       Patient will benefit from skilled therapeutic intervention in order to improve the following deficits and impairments:  Abnormal gait, Decreased balance, Decreased endurance, Decreased mobility, Difficulty walking, Hypomobility, Impaired sensation, Decreased range of motion, Increased edema, Decreased activity tolerance, Decreased strength, Pain  Visit Diagnosis: Pain in left ankle and joints of left foot  Muscle weakness (generalized)  Other abnormalities of gait and mobility     Problem List Patient Active Problem List   Diagnosis Date Noted  . Derangement of anterior horn of lateral meniscus of left knee   . Derangement of posterior horn of medial meniscus of left knee   . Chest pain 04/25/2016  . Hypokalemia 04/25/2016  . Hyperglycemia 04/25/2016  . Diverticulitis of colon 07/17/2014  . Nausea without vomiting 07/17/2014  . Crohn disease ([place]) 07/17/2014  . Diverticulitis of colon without hemorrhage 08/10/2013  . History of arthroscopy of right knee 07/25/2012  . Difficulty in walking(719.7) 06/29/2012  . Weakness of right leg 06/29/2012  . Posterior tibial tendonitis 11/11/2011  . PTTD (posterior tibial  tendon dysfunction) 11/11/2011  . Knee pain, right 11/11/2011  . Chest pain 07/16/2011  . Primary osteoarthritis of left knee 02/25/2010  . PAIN IN JOINT, MULTIPLE SITES 02/25/2010  . Essential hypertension 08/13/2009  . HIATAL HERNIA 08/13/2009  . PALPITATIONS 08/13/2009  . DYSPNEA 08/13/2009   Ihor Austin, [place]; [place]  Aldona Lento 03/03/2018, 1:10 PM  [facility] [address] [place], [place], [address] Phone: [phone]   Fax:  [phone]  Name: [name] MRN: [id number] Date of Birth: 10-25-1965

## 2018-03-04 ENCOUNTER — Encounter

## 2018-03-07 ENCOUNTER — Ambulatory Visit (HOSPITAL_COMMUNITY): Payer: PRIVATE HEALTH INSURANCE | Attending: Orthopedic Surgery | Admitting: Specialist

## 2018-03-07 ENCOUNTER — Encounter (HOSPITAL_COMMUNITY): Payer: Self-pay | Admitting: Specialist

## 2018-03-07 DIAGNOSIS — R29898 Other symptoms and signs involving the musculoskeletal system: Secondary | ICD-10-CM | POA: Insufficient documentation

## 2018-03-07 DIAGNOSIS — M25572 Pain in left ankle and joints of left foot: Secondary | ICD-10-CM | POA: Diagnosis present

## 2018-03-07 DIAGNOSIS — M25612 Stiffness of left shoulder, not elsewhere classified: Secondary | ICD-10-CM | POA: Diagnosis present

## 2018-03-07 DIAGNOSIS — R2689 Other abnormalities of gait and mobility: Secondary | ICD-10-CM | POA: Diagnosis present

## 2018-03-07 DIAGNOSIS — M25512 Pain in left shoulder: Secondary | ICD-10-CM | POA: Insufficient documentation

## 2018-03-07 DIAGNOSIS — M6281 Muscle weakness (generalized): Secondary | ICD-10-CM | POA: Diagnosis present

## 2018-03-07 NOTE — Therapy (Signed)
[facility] [place], [place], [address] Phone: [phone]   Fax:  [phone]  Occupational Therapy Treatment  Patient Details  Name: [name] MRN: [id number] Date of Birth: 11/08/1965 Referring Provider: Wylene Simmer, MD   Encounter Date: 03/07/2018  OT End of Session - 03/07/18 1531    Visit Number  15    Number of Visits  24    Date for OT Re-Evaluation  04/05/18 mini reassess on 6/17    Authorization Type  Workers compensation approved 16 visits for her shoulder. If needing more, we will need to request them.     Authorization - Visit Number  15    Authorization - Number of Visits  16    OT Start Time  2993    OT Stop Time  1205    OT Time Calculation (min)  43 min    Activity Tolerance  Patient tolerated treatment well    Behavior During Therapy  WFL for tasks assessed/performed       Past Medical History:  Diagnosis Date  . Arthritis   . Asthma   . Chest pain    cath 2005 norm cors, repeat cath Feb 2011 normal cors and only minimally  elevated pulmonary pressures  . Crohn disease ([place])   . Diverticulosis   . GERD (gastroesophageal reflux disease)   . Hiatal hernia   . Hypertension   . Obesity   . Palpitations   . Sleep apnea sleep study 11/03/2009    cpap; does sometimes, doesnt use every night.    Past Surgical History:  Procedure Laterality Date  . ABDOMINAL HYSTERECTOMY    . ABDOMINAL HYSTERECTOMY    . ANKLE ARTHROSCOPY  06/01/2012   Procedure: ANKLE ARTHROSCOPY;  Surgeon: Colin Rhein, MD;  Location: [facility];  Service: Orthopedics;  Laterality: Left;  left ankle arthorsocpy with extensive debridement and gastroc slide  . CARDIAC CATHETERIZATION  11/22/2009 and 2005   WNL  . CESAREAN SECTION    . CHOLECYSTECTOMY  09/08/2006   lap. chole.  . CHONDROPLASTY  06/17/2012   Procedure: CHONDROPLASTY;  Surgeon: Carole Civil, MD;  [facility];  Service: Orthopedics;   Laterality: Right;  right patella  . ESOPHAGOGASTRODUODENOSCOPY N/A 05/14/2015   Procedure: ESOPHAGOGASTRODUODENOSCOPY (EGD);  Surgeon: Rogene Houston, MD;  [facility];  Service: Endoscopy;  Laterality: N/A;  730  . INGUINAL HERNIA REPAIR  10/30/2008   right  . KNEE ARTHROSCOPY WITH LATERAL MENISECTOMY Left 12/23/2016   Procedure: LEFT KNEE ARTHROSCOPY WITH LATERAL MENISECTOMY;  Surgeon: Carole Civil, MD;  [facility]: [facility];  Service: Orthopedics;  Laterality: Left;  . KNEE ARTHROSCOPY WITH MEDIAL MENISECTOMY Left 12/23/2016   Procedure: LEFT KNEE ARTHROSCOPY WITH MEDIAL MENISECTOMY CHONDROPLASTY PATELLA  AND MEDIAL FEMORAL CONDYLE LEFT KNEE;  Surgeon: Carole Civil, MD;  [facility];  Service: Orthopedics;  Laterality: Left;  . SHOULDER ARTHROSCOPY WITH SUBACROMIAL DECOMPRESSION AND BICEP TENDON REPAIR Left 12/21/2017   Procedure: Left shoulder arthroscopic biceps tenodesis, SAD, DCR and labrum debridement;  Surgeon: Nicholes Stairs, MD;  Location: [facility];  Service: Orthopedics;  Laterality: Left;  120 mins  . SHOULDER SURGERY     right x 2     There were no vitals filed for this visit.  Subjective Assessment - 03/07/18 1530    Subjective   S:  I know its getting better.    Currently in Pain?  Yes  Pain Score  5     Pain Location  Shoulder    Pain Orientation  Left    Pain Descriptors / Indicators  Aching         [facility] OT Assessment - 03/07/18 0001      Assessment   Medical Diagnosis  Left shoulder SLAP, debridement, and bicep tenodesis      Precautions   Type of Shoulder Precautions  Follow Standard SLAP repair protocol. No lifting over 5#               OT Treatments/Exercises (OP) - 03/07/18 0001      Exercises   Exercises  Shoulder      Shoulder Exercises: Supine   Protraction  PROM;5 reps;AROM;15 reps    Horizontal ABduction  PROM;5 reps;AROM;15 reps    External Rotation  PROM;5 reps;AROM;15 reps    Internal Rotation  PROM;5  reps;AROM;15 reps    Flexion  PROM;5 reps;AROM;15 reps    ABduction  PROM;5 reps;AROM;15 reps      Shoulder Exercises: Sidelying   External Rotation  AROM;10 reps    Internal Rotation  AROM;10 reps    Flexion  AROM;10 reps    ABduction  AROM;10 reps    Other Sidelying Exercises  protraction 10 reps AROM      Shoulder Exercises: Standing   Extension  Theraband;10 reps    Theraband Level (Shoulder Extension)  Level 2 (Red)    Row  Theraband;10 reps    Theraband Level (Shoulder Row)  Level 2 (Red)    Retraction  Theraband;10 reps    Theraband Level (Shoulder Retraction)  Level 2 (Red)      Shoulder Exercises: ROM/Strengthening   "W" Arms  10 times with moderate vg for technique    X to V Arms  10 times with min verbal guidance for technique    Proximal Shoulder Strengthening, Supine  12X with no rest breaks    Proximal Shoulder Strengthening, Seated  10X each no rest breaks      Manual Therapy   Manual Therapy  Myofascial release    Manual therapy comments  completed seperately from all other skilled interventions    Myofascial Release  myofascial release to left shoulder, trapezius, scapular, and upper arm region to decrease pain and improve pain free mobility.                OT Short Term Goals - 01/12/18 1423      OT SHORT TERM GOAL #1   Title  Patient will be educated and independent with HEP for left shoulder mobility for improved abiilty to return to normal work and daily tasks.     Time  6    Period  Weeks    Status  On-going      OT SHORT TERM GOAL #2   Title  patient will improve Left shoulder P/ROM to WNL to increase ability to complete dressing tasks with less difficulty.     Time  6    Period  Weeks    Status  On-going      OT SHORT TERM GOAL #3   Title  Patient will increase left shoulder strength to 3+/5 in order to complete lightweight household actiivities with LUE.     Time  6    Period  Weeks    Status  On-going      OT SHORT TERM GOAL #4    Title  Patient will decrease pain level in left shoulder to 5/10  with daily task completion.     Time  6    Period  Weeks    Status  On-going      OT SHORT TERM GOAL #5   Title  Patient will decrease fascial restrictions in LUE to mod amount in order to increase functional mobility needed to complete overhead and reaching tasks.     Time  6    Period  Weeks    Status  On-going        OT Long Term Goals - 01/12/18 1424      OT LONG TERM GOAL #1   Title  Patient will return to highest level of independence with her LUE in order to return to work and complete required tasks.     Time  12    Period  Weeks    Status  On-going      OT LONG TERM GOAL #2   Title  patient will increase A/ROM of LUE to WNL to increase ability to complete over head reaching tasks without difficulty.     Time  12    Period  Weeks    Status  On-going      OT LONG TERM GOAL #3   Title  Patient will increase LUE strength to 4+/5 to increase ability to complete chest compressions during CPR when needed.     Time  12    Period  Weeks    Status  On-going      OT LONG TERM GOAL #4   Title  Patient will decrease fascial restrictions to min amount or less in order to increase functional mobility needed for overhead and reaching tasks.     Time  12    Period  Weeks    Status  On-going      OT LONG TERM GOAL #5   Title  Patient will report a decreased level of pain of approximately 2/10 when completing work or daily tasks.     Time  12    Period  Weeks    Status  On-going            Plan - 03/07/18 1533    Clinical Impression Statement  A:  added w arms for improved scapular stability.  progressed from supine a/rom to sidelying, which proved challenging for patient this date.      Plan  P:  increase repetitions with sidelying a/rom, review HEP added this date.   request additional visits from wc.         Patient will benefit from skilled therapeutic intervention in order to improve the following  deficits and impairments:  Pain, Decreased range of motion, Impaired UE functional use, Increased fascial restrictions, Decreased strength, Impaired flexibility  Visit Diagnosis: Other symptoms and signs involving the musculoskeletal system  Acute pain of left shoulder    Problem List Patient Active Problem List   Diagnosis Date Noted  . Derangement of anterior horn of lateral meniscus of left knee   . Derangement of posterior horn of medial meniscus of left knee   . Chest pain 04/25/2016  . Hypokalemia 04/25/2016  . Hyperglycemia 04/25/2016  . Diverticulitis of colon 07/17/2014  . Nausea without vomiting 07/17/2014  . Crohn disease ([place]) 07/17/2014  . Diverticulitis of colon without hemorrhage 08/10/2013  . History of arthroscopy of right knee 07/25/2012  . Difficulty in walking(719.7) 06/29/2012  . Weakness of right leg 06/29/2012  . Posterior tibial tendonitis 11/11/2011  . PTTD (posterior tibial tendon dysfunction) 11/11/2011  .  Knee pain, right 11/11/2011  . Chest pain 07/16/2011  . Primary osteoarthritis of left knee 02/25/2010  . PAIN IN JOINT, MULTIPLE SITES 02/25/2010  . Essential hypertension 08/13/2009  . HIATAL HERNIA 08/13/2009  . PALPITATIONS 08/13/2009  . DYSPNEA 08/13/2009    Vangie Bicker, [place], OTR/L [phone]  03/07/2018, 3:38 PM  [facility] [place], [place], [address] Phone: [phone]   Fax:  [phone]  Name: [name] MRN: [id number] Date of Birth: 05/14/1966

## 2018-03-07 NOTE — Patient Instructions (Signed)

## 2018-03-09 ENCOUNTER — Ambulatory Visit (HOSPITAL_COMMUNITY): Payer: PRIVATE HEALTH INSURANCE | Admitting: Physical Therapy

## 2018-03-09 ENCOUNTER — Encounter (HOSPITAL_COMMUNITY): Payer: Self-pay | Admitting: Physical Therapy

## 2018-03-09 DIAGNOSIS — M6281 Muscle weakness (generalized): Secondary | ICD-10-CM

## 2018-03-09 DIAGNOSIS — R29898 Other symptoms and signs involving the musculoskeletal system: Secondary | ICD-10-CM | POA: Diagnosis not present

## 2018-03-09 DIAGNOSIS — R2689 Other abnormalities of gait and mobility: Secondary | ICD-10-CM

## 2018-03-09 DIAGNOSIS — M25572 Pain in left ankle and joints of left foot: Secondary | ICD-10-CM

## 2018-03-09 NOTE — Patient Instructions (Signed)
  Seated Heel/Toe Raises Sitting in a chair, push your heels in the air so you come up on your toes. Lower your heels and raise your toes toward the Ceiling. Repeat 10 Times Complete 1 Set Perform 1 Time(s) a Day

## 2018-03-09 NOTE — Therapy (Signed)
[place], [place], [address] Phone: [phone]   Fax:  [phone]  Physical Therapy Treatment  Patient Details  Name: [name] MRN: [id number] Date of Birth: 06-12-1966 Referring Provider: Wylene Simmer, MD   Encounter Date: 03/09/2018  PT End of Session - 03/09/18 1211    Visit Number  5    Number of Visits  8    Date for PT Re-Evaluation  03/25/18    Authorization Type  Workman's Compensation (Approved for 8 visits)     Authorization Time Period  02/22/18- 03/25/18    Authorization - Visit Number  5    Authorization - Number of Visits  8    PT Start Time  0902    PT Stop Time  0943    PT Time Calculation (min)  41 min    Equipment Utilized During Treatment  Other (comment) Left ankle brace    Activity Tolerance  Patient limited by pain;Patient tolerated treatment well    Behavior During Therapy  [facility] for tasks assessed/performed       Past Medical History:  Diagnosis Date  . Arthritis   . Asthma   . Chest pain    cath 2005 norm cors, repeat cath Feb 2011 normal cors and only minimally  elevated pulmonary pressures  . Crohn disease ([place])   . Diverticulosis   . GERD (gastroesophageal reflux disease)   . Hiatal hernia   . Hypertension   . Obesity   . Palpitations   . Sleep apnea sleep study 11/03/2009    cpap; does sometimes, doesnt use every night.    Past Surgical History:  Procedure Laterality Date  . ABDOMINAL HYSTERECTOMY    . ABDOMINAL HYSTERECTOMY    . ANKLE ARTHROSCOPY  06/01/2012   Procedure: ANKLE ARTHROSCOPY;  Surgeon: Colin Rhein, MD;  Location: [place];  Service: Orthopedics;  Laterality: Left;  left ankle arthorsocpy with extensive debridement and gastroc slide  . CARDIAC CATHETERIZATION  11/22/2009 and 2005   WNL  . CESAREAN SECTION    . CHOLECYSTECTOMY  09/08/2006   lap. chole.  . CHONDROPLASTY  06/17/2012   Procedure: CHONDROPLASTY;  Surgeon: Carole Civil, MD;  [facility];  Service: Orthopedics;  Laterality: Right;  right patella  . ESOPHAGOGASTRODUODENOSCOPY N/A 05/14/2015   Procedure: ESOPHAGOGASTRODUODENOSCOPY (EGD);  Surgeon: Rogene Houston, MD;  [facility];  Service: Endoscopy;  Laterality: N/A;  730  . INGUINAL HERNIA REPAIR  10/30/2008   right  . KNEE ARTHROSCOPY WITH LATERAL MENISECTOMY Left 12/23/2016   Procedure: LEFT KNEE ARTHROSCOPY WITH LATERAL MENISECTOMY;  Surgeon: Carole Civil, MD;  [facility];  Service: Orthopedics;  Laterality: Left;  . KNEE ARTHROSCOPY WITH MEDIAL MENISECTOMY Left 12/23/2016   Procedure: LEFT KNEE ARTHROSCOPY WITH MEDIAL MENISECTOMY CHONDROPLASTY PATELLA  AND MEDIAL FEMORAL CONDYLE LEFT KNEE;  Surgeon: Carole Civil, MD;  [facility];  Service: Orthopedics;  Laterality: Left;  . SHOULDER ARTHROSCOPY WITH SUBACROMIAL DECOMPRESSION AND BICEP TENDON REPAIR Left 12/21/2017   Procedure: Left shoulder arthroscopic biceps tenodesis, SAD, DCR and labrum debridement;  Surgeon: Nicholes Stairs, MD;  Location: [facility];  Service: Orthopedics;  Laterality: Left;  120 mins  . SHOULDER SURGERY     right x 2     There were no vitals filed for this visit.  Subjective Assessment - 03/09/18 0905    Subjective  Patient reported that following physician appointment she is planning  to be custom fit for either a brace or an orthotic. She said surgery might be indicated.     Pertinent History  Fall on July 31, 2017 which affected her left side. Left shoulder surgery 12/21/17    Patient Stated Goals  Strengthening that ankle because she feels like her ankle isn't supported    Currently in Pain?  Yes    Pain Score  5     Pain Location  Ankle    Pain Orientation  Left    Pain Descriptors / Indicators  Aching;Dull    Pain Type  Chronic pain    Pain Onset  More than a month ago                       [facility] Adult PT Treatment/Exercise - 03/09/18 0001      Manual  Therapy   Manual Therapy  Soft tissue mobilization;Joint mobilization    Manual therapy comments  completed seperately from all other skilled interventions    Joint Mobilization  Patient supine Lt. gentle calcaneal distraction 10 x 10 second holds for pain relief     Soft tissue mobilization  Gentle soft tissue mobilization to lateral left ankle for pain control with patient in supine      Ankle Exercises: Seated   Towel Crunch  2 reps Toe crunches    Towel Inversion/Eversion Limitations  2 minutes ankle eversion and inversion on towel    Heel Raises  Both;20 reps    Toe Raise  20 reps Both    BAPS  Sitting;15 reps;Level 2;Other (comment) DF/PF, and clockwise circles, Lt. lower extremity    Other Seated Ankle Exercises  Ankle inversion/eversion isometrics with pink ball into wall 10 x 10 second holds    Other Seated Ankle Exercises  arch formation 10x5"             PT Education - 03/09/18 0955    Education provided  Yes    Education Details  Educated patient on purpose and technique of exercises as well as on updated HEP.     Person(s) Educated  Patient    Methods  Explanation;Demonstration;Tactile cues;Verbal cues    Comprehension  Verbalized understanding;Returned demonstration       PT Short Term Goals - 02/22/18 1603      PT SHORT TERM GOAL #1   Title  Pt will be independent with HEP and perform consistently in order to maximize return to PLOF.    Time  2    Period  Weeks    Status  New    Target Date  03/08/18      PT SHORT TERM GOAL #2   Title  Patient will demonstrate improvement in left ankle AROM of 5 degrees in all deficient planes to improve gait mechanics and safety.     Time  2    Period  Weeks    Status  New    Target Date  03/08/18      PT SHORT TERM GOAL #3   Title  Patient will demonstrate improvement of 1/2 MMT grade in all deficient musculature to assist with proper gait mechanics.     Time  2    Period  Weeks    Status  New    Target Date   03/08/18        PT Long Term Goals - 02/22/18 1642      PT LONG TERM GOAL #1   Title  Patient will  demonstrate improved SLS to 10 seconds on left lower extremity to demonstrate improved stability and proprioception of left ankle and improve gait mechanics.    Time  4    Period  Weeks    Status  New    Target Date  03/25/18      PT LONG TERM GOAL #2   Title  Patient will demonstrate improvement of 1 MMT grade in all musculature tested as deficient at evaluation.     Time  4    Period  Weeks    Status  New    Target Date  03/25/18      PT LONG TERM GOAL #3   Title  Patient will demonstrate ability to ambulate at a gait velocity of 1.0 m/s on 2MWT indicating improved tolerance to ambulation and improved gait mechanics.     Time  4    Period  Weeks    Status  New    Target Date  03/25/18            Plan - 03/09/18 1212    Clinical Impression Statement  This session continued to progress patient with ankle mobility and strengthening as well as interventions for pain control. This session added seated BAPS board exercise. Therapist provided patient with signed referral from her physician for an orthotic. In addition, therapist educated patient on performing an updated HEP. At the end of session performed manual therapy including gentle calcaneal distraction and soft tissue mobilization. Patient would benefit from skilled physical therapy to continue progress toward goals.     Rehab Potential  Fair    Clinical Impairments Affecting Rehab Potential  Positive: Motivated and positive attitude; Negative: Chronicity of issue    PT Frequency  2x / week    PT Duration  4 weeks    PT Treatment/Interventions  ADLs/Self Care Home Management;Cryotherapy;Electrical Stimulation;DME Instruction;Gait training;Stair training;Functional mobility training;Therapeutic activities;Therapeutic exercise;Balance training;Neuromuscular re-education;Patient/family education;Manual techniques;Passive range of  motion;Dry needling;Energy conservation;Splinting;Taping;Orthotic Fit/Training;Ultrasound    PT Next Visit Plan  Pt is allergic to essential oils and very strong smelling lotions but she is fine with the massage cream/oil used in clinic; continue isometrics for ankle strength. Physician order stated to continue strengthening for posterior tibial tendon. Manual for pain reduction and edema reduction as needed. continue to try and correct calcaneal eversion. Follow-up on referral from physician about orthotics.     PT Home Exercise Plan  5/23: ABCs; 03/09/18: Seated heel raises and toe raises x 10 reps 1x/day       Patient will benefit from skilled therapeutic intervention in order to improve the following deficits and impairments:  Abnormal gait, Decreased balance, Decreased endurance, Decreased mobility, Difficulty walking, Hypomobility, Impaired sensation, Decreased range of motion, Increased edema, Decreased activity tolerance, Decreased strength, Pain  Visit Diagnosis: Pain in left ankle and joints of left foot  Muscle weakness (generalized)  Other abnormalities of gait and mobility     Problem List Patient Active Problem List   Diagnosis Date Noted  . Derangement of anterior horn of lateral meniscus of left knee   . Derangement of posterior horn of medial meniscus of left knee   . Chest pain 04/25/2016  . Hypokalemia 04/25/2016  . Hyperglycemia 04/25/2016  . Diverticulitis of colon 07/17/2014  . Nausea without vomiting 07/17/2014  . Crohn disease ([place]) 07/17/2014  . Diverticulitis of colon without hemorrhage 08/10/2013  . History of arthroscopy of right knee 07/25/2012  . Difficulty in walking(719.7) 06/29/2012  . Weakness of right leg 06/29/2012  .  Posterior tibial tendonitis 11/11/2011  . PTTD (posterior tibial tendon dysfunction) 11/11/2011  . Knee pain, right 11/11/2011  . Chest pain 07/16/2011  . Primary osteoarthritis of left knee 02/25/2010  . PAIN IN JOINT, MULTIPLE  SITES 02/25/2010  . Essential hypertension 08/13/2009  . HIATAL HERNIA 08/13/2009  . PALPITATIONS 08/13/2009  . DYSPNEA 08/13/2009   Clarene Critchley PT, DPT 12:16 PM, 03/09/18 [place], [place], [address] Phone: [phone]   Fax:  [phone]  Name: [name] MRN: [id number] Date of Birth: [birth year]

## 2018-03-10 ENCOUNTER — Ambulatory Visit (HOSPITAL_COMMUNITY): Payer: PRIVATE HEALTH INSURANCE | Admitting: Occupational Therapy

## 2018-03-10 ENCOUNTER — Encounter (HOSPITAL_COMMUNITY): Payer: Self-pay | Admitting: Occupational Therapy

## 2018-03-10 DIAGNOSIS — R29898 Other symptoms and signs involving the musculoskeletal system: Secondary | ICD-10-CM

## 2018-03-10 DIAGNOSIS — M25612 Stiffness of left shoulder, not elsewhere classified: Secondary | ICD-10-CM

## 2018-03-10 DIAGNOSIS — M25512 Pain in left shoulder: Secondary | ICD-10-CM

## 2018-03-10 NOTE — Therapy (Signed)
[place], [place], [address] Phone: [phone]   Fax:  [phone]  Occupational Therapy Treatment  Patient Details  Name: [name] MRN: [id number] Date of Birth: [birth year] Referring Provider: Wylene Simmer, MD   Encounter Date: 03/10/2018  OT End of Session - 03/10/18 1130    Visit Number  16    Number of Visits  24    Date for OT Re-Evaluation  04/05/18 mini reassess on 6/17    Authorization Type  Workers compensation approved 16 visits for her shoulder. If needing more, we will need to request them.     Authorization - Visit Number  16    Authorization - Number of Visits  16    OT Start Time  1034    OT Stop Time  1116    OT Time Calculation (min)  42 min    Activity Tolerance  Patient tolerated treatment well    Behavior During Therapy  WFL for tasks assessed/performed       Past Medical History:  Diagnosis Date  . Arthritis   . Asthma   . Chest pain    cath 2005 norm cors, repeat cath Feb 2011 normal cors and only minimally  elevated pulmonary pressures  . Crohn disease ([place])   . Diverticulosis   . GERD (gastroesophageal reflux disease)   . Hiatal hernia   . Hypertension   . Obesity   . Palpitations   . Sleep apnea sleep study 11/03/2009    cpap; does sometimes, doesnt use every night.    Past Surgical History:  Procedure Laterality Date  . ABDOMINAL HYSTERECTOMY    . ABDOMINAL HYSTERECTOMY    . ANKLE ARTHROSCOPY  06/01/2012   Procedure: ANKLE ARTHROSCOPY;  Surgeon: Colin Rhein, MD;  Location: [facility];  Service: Orthopedics;  Laterality: Left;  left ankle arthorsocpy with extensive debridement and gastroc slide  . CARDIAC CATHETERIZATION  11/22/2009 and 2005   WNL  . CESAREAN SECTION    . CHOLECYSTECTOMY  09/08/2006   lap. chole.  . CHONDROPLASTY  06/17/2012   Procedure: CHONDROPLASTY;  Surgeon: Carole Civil, MD;  [facility];  Service: Orthopedics;   Laterality: Right;  right patella  . ESOPHAGOGASTRODUODENOSCOPY N/A 05/14/2015   Procedure: ESOPHAGOGASTRODUODENOSCOPY (EGD);  Surgeon: Rogene Houston, MD;  [facility];  Service: Endoscopy;  Laterality: N/A;  730  . INGUINAL HERNIA REPAIR  10/30/2008   right  . KNEE ARTHROSCOPY WITH LATERAL MENISECTOMY Left 12/23/2016   Procedure: LEFT KNEE ARTHROSCOPY WITH LATERAL MENISECTOMY;  Surgeon: Carole Civil, MD;  [facility];  Service: Orthopedics;  Laterality: Left;  . KNEE ARTHROSCOPY WITH MEDIAL MENISECTOMY Left 12/23/2016   Procedure: LEFT KNEE ARTHROSCOPY WITH MEDIAL MENISECTOMY CHONDROPLASTY PATELLA  AND MEDIAL FEMORAL CONDYLE LEFT KNEE;  Surgeon: Carole Civil, MD;  [facility];  Service: Orthopedics;  Laterality: Left;  . SHOULDER ARTHROSCOPY WITH SUBACROMIAL DECOMPRESSION AND BICEP TENDON REPAIR Left 12/21/2017   Procedure: Left shoulder arthroscopic biceps tenodesis, SAD, DCR and labrum debridement;  Surgeon: Nicholes Stairs, MD;  Location: [facility];  Service: Orthopedics;  Laterality: Left;  120 mins  . SHOULDER SURGERY     right x 2     There were no vitals filed for this visit.  Subjective Assessment - 03/10/18 1034    Subjective   S: Those exercises last time were a little bit harder.     Currently in Pain?  Yes    Pain Score  4     Pain Location  Shoulder    Pain Orientation  Left    Pain Descriptors / Indicators  Aching;Sore    Pain Type  Acute pain    Pain Radiating Towards  none    Pain Onset  More than a month ago    Pain Frequency  Constant    Aggravating Factors   weight bearing    Pain Relieving Factors  rest, pain medication    Effect of Pain on Daily Activities  min effect on ADLs    Multiple Pain Sites  No         [facility] OT Assessment - 03/10/18 1034      Assessment   Medical Diagnosis  Left shoulder SLAP, debridement, and bicep tenodesis      Precautions   Precautions  Shoulder    Type of Shoulder Precautions  Follow  Standard SLAP repair protocol. No lifting over 5#               OT Treatments/Exercises (OP) - 03/10/18 1037      Exercises   Exercises  Shoulder      Shoulder Exercises: Seated   Protraction  AROM;12 reps    Horizontal ABduction  AROM;12 reps    External Rotation  AROM;12 reps    Internal Rotation  AROM;12 reps    Flexion  AROM;12 reps    Abduction  AROM;12 reps      Shoulder Exercises: Sidelying   External Rotation  AROM;10 reps    Internal Rotation  AROM;10 reps    Flexion  AROM;10 reps    ABduction  AROM;10 reps    Other Sidelying Exercises  protraction 10 reps AROM    Other Sidelying Exercises  horizontal abduction, A/ROM, 10X      Shoulder Exercises: ROM/Strengthening   "W" Arms  10 times with moderate vg for technique    X to V Arms  10X      Manual Therapy   Manual Therapy  Soft tissue mobilization;Joint mobilization    Manual therapy comments  completed seperately from all other skilled interventions    Myofascial Release  myofascial release to left shoulder, trapezius, scapular, and upper arm region to decrease pain and improve pain free mobility.                OT Short Term Goals - 01/12/18 1423      OT SHORT TERM GOAL #1   Title  Patient will be educated and independent with HEP for left shoulder mobility for improved abiilty to return to normal work and daily tasks.     Time  6    Period  Weeks    Status  On-going      OT SHORT TERM GOAL #2   Title  patient will improve Left shoulder P/ROM to WNL to increase ability to complete dressing tasks with less difficulty.     Time  6    Period  Weeks    Status  On-going      OT SHORT TERM GOAL #3   Title  Patient will increase left shoulder strength to 3+/5 in order to complete lightweight household actiivities with LUE.     Time  6    Period  Weeks    Status  On-going      OT SHORT TERM GOAL #4   Title  Patient will decrease pain level in left shoulder to 5/10 with daily task  completion.      Time  6    Period  Weeks    Status  On-going      OT SHORT TERM GOAL #5   Title  Patient will decrease fascial restrictions in LUE to mod amount in order to increase functional mobility needed to complete overhead and reaching tasks.     Time  6    Period  Weeks    Status  On-going        OT Long Term Goals - 01/12/18 1424      OT LONG TERM GOAL #1   Title  Patient will return to highest level of independence with her LUE in order to return to work and complete required tasks.     Time  12    Period  Weeks    Status  On-going      OT LONG TERM GOAL #2   Title  patient will increase A/ROM of LUE to WNL to increase ability to complete over head reaching tasks without difficulty.     Time  12    Period  Weeks    Status  On-going      OT LONG TERM GOAL #3   Title  Patient will increase LUE strength to 4+/5 to increase ability to complete chest compressions during CPR when needed.     Time  12    Period  Weeks    Status  On-going      OT LONG TERM GOAL #4   Title  Patient will decrease fascial restrictions to min amount or less in order to increase functional mobility needed for overhead and reaching tasks.     Time  12    Period  Weeks    Status  On-going      OT LONG TERM GOAL #5   Title  Patient will report a decreased level of pain of approximately 2/10 when completing work or daily tasks.     Time  12    Period  Weeks    Status  On-going            Plan - 03/10/18 1130    Clinical Impression Statement  A: Continued with sidelying A/ROM for scapular stability, resumed seated A/ROM as well as continuing x to v arms and w arms working on scapular stability and shoulder strengthening. Did not complete theraband exercises due to time constraints. Verbal cuing for form and technique during exercises.     Plan  P: Mini-reassessment and send measurements with pt for MD appt       Patient will benefit from skilled therapeutic intervention in order to improve the  following deficits and impairments:  Pain, Decreased range of motion, Impaired UE functional use, Increased fascial restrictions, Decreased strength, Impaired flexibility  Visit Diagnosis: Other symptoms and signs involving the musculoskeletal system  Acute pain of left shoulder  Stiffness of left shoulder, not elsewhere classified    Problem List Patient Active Problem List   Diagnosis Date Noted  . Derangement of anterior horn of lateral meniscus of left knee   . Derangement of posterior horn of medial meniscus of left knee   . Chest pain 04/25/2016  . Hypokalemia 04/25/2016  . Hyperglycemia 04/25/2016  . Diverticulitis of colon 07/17/2014  . Nausea without vomiting 07/17/2014  . Crohn disease ([place]) 07/17/2014  . Diverticulitis of colon without hemorrhage 08/10/2013  . History of arthroscopy of right knee 07/25/2012  . Difficulty in walking(719.7) 06/29/2012  . Weakness of  right leg 06/29/2012  . Posterior tibial tendonitis 11/11/2011  . PTTD (posterior tibial tendon dysfunction) 11/11/2011  . Knee pain, right 11/11/2011  . Chest pain 07/16/2011  . Primary osteoarthritis of left knee 02/25/2010  . PAIN IN JOINT, MULTIPLE SITES 02/25/2010  . Essential hypertension 08/13/2009  . HIATAL HERNIA 08/13/2009  . PALPITATIONS 08/13/2009  . DYSPNEA 08/13/2009   Guadelupe Sabin, OTR/L  [phone] 03/10/2018, 11:33 AM  [facility] [address] [place], [place], [address] Phone: [phone]   Fax:  [phone]  Name: [name] MRN: [id number] Date of Birth: Oct 30, 1965

## 2018-03-11 ENCOUNTER — Telehealth (HOSPITAL_COMMUNITY): Payer: Self-pay | Admitting: Occupational Therapy

## 2018-03-11 NOTE — Telephone Encounter (Signed)
Need documentation to cover the verbal approval for 8 PT visit from 02/22/2018 by [facility] noted in epic.

## 2018-03-16 ENCOUNTER — Encounter (HOSPITAL_COMMUNITY): Payer: Self-pay

## 2018-03-16 ENCOUNTER — Ambulatory Visit (HOSPITAL_COMMUNITY): Payer: PRIVATE HEALTH INSURANCE

## 2018-03-16 DIAGNOSIS — M25572 Pain in left ankle and joints of left foot: Secondary | ICD-10-CM

## 2018-03-16 DIAGNOSIS — R2689 Other abnormalities of gait and mobility: Secondary | ICD-10-CM

## 2018-03-16 DIAGNOSIS — R29898 Other symptoms and signs involving the musculoskeletal system: Secondary | ICD-10-CM | POA: Diagnosis not present

## 2018-03-16 DIAGNOSIS — M6281 Muscle weakness (generalized): Secondary | ICD-10-CM

## 2018-03-16 NOTE — Therapy (Signed)
[place], [place], [address] Phone: [phone]   Fax:  [phone]  Physical Therapy Treatment  Patient Details  Name: [name] MRN: [id number] Date of Birth: 1965-12-04 Referring Provider: Wylene Simmer, MD   Encounter Date: 03/16/2018  PT End of Session - 03/16/18 1007    Visit Number  6    Number of Visits  8    Date for PT Re-Evaluation  03/25/18    Authorization Type  Workman's Compensation (Approved for 8 visits)     Authorization Time Period  02/22/18- 03/25/18    Authorization - Visit Number  6    Authorization - Number of Visits  8    PT Start Time  0949    PT Stop Time  1036    PT Time Calculation (min)  47 min    Equipment Utilized During Treatment  Other (comment) Left ankle brace; not utilized during treatment    Activity Tolerance  Patient limited by pain;Patient tolerated treatment well    Behavior During Therapy  [facility] for tasks assessed/performed       Past Medical History:  Diagnosis Date  . Arthritis   . Asthma   . Chest pain    cath 2005 norm cors, repeat cath Feb 2011 normal cors and only minimally  elevated pulmonary pressures  . Crohn disease ([place])   . Diverticulosis   . GERD (gastroesophageal reflux disease)   . Hiatal hernia   . Hypertension   . Obesity   . Palpitations   . Sleep apnea sleep study 11/03/2009    cpap; does sometimes, doesnt use every night.    Past Surgical History:  Procedure Laterality Date  . ABDOMINAL HYSTERECTOMY    . ABDOMINAL HYSTERECTOMY    . ANKLE ARTHROSCOPY  06/01/2012   Procedure: ANKLE ARTHROSCOPY;  Surgeon: Colin Rhein, MD;  Location: [facility];  Service: Orthopedics;  Laterality: Left;  left ankle arthorsocpy with extensive debridement and gastroc slide  . CARDIAC CATHETERIZATION  11/22/2009 and 2005   WNL  . CESAREAN SECTION    . CHOLECYSTECTOMY  09/08/2006   lap. chole.  . CHONDROPLASTY  06/17/2012   Procedure:  CHONDROPLASTY;  Surgeon: Carole Civil, MD;  [facility];  Service: Orthopedics;  Laterality: Right;  right patella  . ESOPHAGOGASTRODUODENOSCOPY N/A 05/14/2015   Procedure: ESOPHAGOGASTRODUODENOSCOPY (EGD);  Surgeon: Rogene Houston, MD;  [facility];  Service: Endoscopy;  Laterality: N/A;  730  . INGUINAL HERNIA REPAIR  10/30/2008   right  . KNEE ARTHROSCOPY WITH LATERAL MENISECTOMY Left 12/23/2016   Procedure: LEFT KNEE ARTHROSCOPY WITH LATERAL MENISECTOMY;  Surgeon: Carole Civil, MD;  [facility];  Service: Orthopedics;  Laterality: Left;  . KNEE ARTHROSCOPY WITH MEDIAL MENISECTOMY Left 12/23/2016   Procedure: LEFT KNEE ARTHROSCOPY WITH MEDIAL MENISECTOMY CHONDROPLASTY PATELLA  AND MEDIAL FEMORAL CONDYLE LEFT KNEE;  Surgeon: Carole Civil, MD;  [facility];  Service: Orthopedics;  Laterality: Left;  . SHOULDER ARTHROSCOPY WITH SUBACROMIAL DECOMPRESSION AND BICEP TENDON REPAIR Left 12/21/2017   Procedure: Left shoulder arthroscopic biceps tenodesis, SAD, DCR and labrum debridement;  Surgeon: Nicholes Stairs, MD;  Location: [facility];  Service: Orthopedics;  Laterality: Left;  120 mins  . SHOULDER SURGERY     right x 2     There were no vitals filed for this visit.  Subjective Assessment - 03/16/18 1001    Subjective  Currently 4/10 seated; 8/10 standing/walking.  Needs to buy new supportive sandals for summer. Went yesterday to [employer] and was measured for a [employer], Sees MD 04/22/2018    Pertinent History  Fall on July 31, 2017 which affected her left side. Left shoulder surgery 12/21/17    Limitations  Standing;Walking;House hold activities;Lifting    How long can you sit comfortably?  Does not affect the ankle    Diagnostic tests  02/01/18: MR of Left ankle "Extra-articular subcortical reactive marrow changes along the lateral aspect of the talus and calcaneus as can be seen with lateral hindfoot impingement"    Patient Stated Goals  Strengthening  that ankle because she feels like her ankle isn't supported    Currently in Pain?  Yes    Pain Score  8     Pain Onset  More than a month ago                       [facility] Adult PT Treatment/Exercise - 03/16/18 0001      Manual Therapy   Manual Therapy  Soft tissue mobilization;Joint mobilization    Manual therapy comments  completed seperately from all other skilled interventions    Joint Mobilization  Patient supine Lt. gentle calcaneal distraction 10 x 10 second holds for pain relief     Soft tissue mobilization  Gentle soft tissue mobilization to lateral left ankle for pain control with patient in supine      Ankle Exercises: Seated   Towel Crunch  2 reps toe crunches    Towel Inversion/Eversion Limitations  2 minutes ankle eversion and inversion on towel    Heel Raises  Both;20 reps    Toe Raise  20 reps    BAPS  Sitting;15 reps;Level 2;Other (comment)    Other Seated Ankle Exercises  --    Other Seated Ankle Exercises  arch formation 10x5"          Balance Exercises - 03/16/18 1015      Balance Exercises: Standing   Tandem Stance  Eyes open;2 reps;20 secs        PT Education - 03/16/18 1225    Education provided  Yes    Education Details  Educated patient on purpose and technique of exercises.    Person(s) Educated  Patient    Methods  Explanation;Demonstration    Comprehension  Verbalized understanding;Returned demonstration       PT Short Term Goals - 03/16/18 1019      PT SHORT TERM GOAL #1   Title  Pt will be independent with HEP and perform consistently in order to maximize return to PLOF.    Time  2    Period  Weeks    Status  Achieved      PT SHORT TERM GOAL #2   Title  Patient will demonstrate improvement in left ankle AROM of 5 degrees in all deficient planes to improve gait mechanics and safety.     Time  2    Period  Weeks    Status  On-going      PT SHORT TERM GOAL #3   Title  Patient will demonstrate improvement of 1/2 MMT  grade in all deficient musculature to assist with proper gait mechanics.     Time  2    Period  Weeks    Status  On-going        PT Long Term Goals - 03/16/18 1019      PT LONG TERM GOAL #1  Title  Patient will demonstrate improved SLS to 10 seconds on left lower extremity to demonstrate improved stability and proprioception of left ankle and improve gait mechanics.    Time  4    Period  Weeks    Status  On-going      PT LONG TERM GOAL #2   Title  Patient will demonstrate improvement of 1 MMT grade in all musculature tested as deficient at evaluation.     Time  4    Period  Weeks    Status  On-going      PT LONG TERM GOAL #3   Title  Patient will demonstrate ability to ambulate at a gait velocity of 1.0 m/s on 2MWT indicating improved tolerance to ambulation and improved gait mechanics.     Time  4    Period  Weeks    Status  On-going            Plan - 03/16/18 1009    Clinical Impression Statement  This session continued to progress patient with ankle mobility and strengthening as well as interventions for pain control. This session added tandem stance balance exercise on level surface. Educated patient on performing updated HEP for balance. End of session performed manual therapy including gentle calcaneal distraction and soft tissue mobilizations.  Tender to palpation over posterior tibialis, anterior tibialis and peroneal tendons distally to muscle bellies. Patient would benefit from skilled PT to continue to progress toward goals.    History and Personal Factors relevant to plan of care:  Patient allergic to essential oils and possibly lotion. Fall 07/31/2017    Clinical Impairments Affecting Rehab Potential  Positive: Motivated and positive attitude; Negative: Chronicity of issue    PT Frequency  2x / week    PT Duration  4 weeks    PT Treatment/Interventions  ADLs/Self Care Home Management;Cryotherapy;Electrical Stimulation;DME Instruction;Gait training;Stair  training;Functional mobility training;Therapeutic activities;Therapeutic exercise;Balance training;Neuromuscular re-education;Patient/family education;Manual techniques;Passive range of motion;Dry needling;Energy conservation;Splinting;Taping;Orthotic Fit/Training;Ultrasound    PT Next Visit Plan  Pt is allergic to essential oils and very strong smelling lotions but she is fine with the massage cream/oil used in clinic; continue isometrics for ankle strength; assess reaction to tandem stance balance exercise today. Physician order stated to continue strengthening for posterior tibial tendon. Manual for pain reduction and edema reduction as needed. continue to try and correct calcaneal eversion. Follow-up on referral from physician about orthotics.     PT Home Exercise Plan  5/23: ABCs; 03/09/18: Seated heel raises and toe raises x 10 reps 1x/day    Consulted and Agree with Plan of Care  Patient       Patient will benefit from skilled therapeutic intervention in order to improve the following deficits and impairments:  Abnormal gait, Decreased balance, Decreased endurance, Decreased mobility, Difficulty walking, Hypomobility, Impaired sensation, Decreased range of motion, Increased edema, Decreased activity tolerance, Decreased strength, Pain  Visit Diagnosis: Pain in left ankle and joints of left foot  Muscle weakness (generalized)  Other abnormalities of gait and mobility     Problem List Patient Active Problem List   Diagnosis Date Noted  . Derangement of anterior horn of lateral meniscus of left knee   . Derangement of posterior horn of medial meniscus of left knee   . Chest pain 04/25/2016  . Hypokalemia 04/25/2016  . Hyperglycemia 04/25/2016  . Diverticulitis of colon 07/17/2014  . Nausea without vomiting 07/17/2014  . Crohn disease ([place]) 07/17/2014  . Diverticulitis of colon without hemorrhage 08/10/2013  .  History of arthroscopy of right knee 07/25/2012  . Difficulty in  walking(719.7) 06/29/2012  . Weakness of right leg 06/29/2012  . Posterior tibial tendonitis 11/11/2011  . PTTD (posterior tibial tendon dysfunction) 11/11/2011  . Knee pain, right 11/11/2011  . Chest pain 07/16/2011  . Primary osteoarthritis of left knee 02/25/2010  . PAIN IN JOINT, MULTIPLE SITES 02/25/2010  . Essential hypertension 08/13/2009  . HIATAL HERNIA 08/13/2009  . PALPITATIONS 08/13/2009  . DYSPNEA 08/13/2009    Floria Raveling. Hartnett-Rands, MS, PT [facility] #[id number] 03/16/2018, 12:31 PM  [place] [address] [place], [place], [address] Phone: [phone]   Fax:  [phone]  Name: DYNVER CLEMSON MRN: [id number] Date of Birth: 20-Sep-1966

## 2018-03-18 ENCOUNTER — Ambulatory Visit (HOSPITAL_COMMUNITY): Payer: PRIVATE HEALTH INSURANCE

## 2018-03-18 ENCOUNTER — Encounter (HOSPITAL_COMMUNITY): Payer: Self-pay

## 2018-03-18 DIAGNOSIS — R29898 Other symptoms and signs involving the musculoskeletal system: Secondary | ICD-10-CM | POA: Diagnosis not present

## 2018-03-18 DIAGNOSIS — M25572 Pain in left ankle and joints of left foot: Secondary | ICD-10-CM

## 2018-03-18 DIAGNOSIS — M6281 Muscle weakness (generalized): Secondary | ICD-10-CM

## 2018-03-18 DIAGNOSIS — R2689 Other abnormalities of gait and mobility: Secondary | ICD-10-CM

## 2018-03-18 NOTE — Therapy (Addendum)
[place], [place], [address] Phone: [phone]   Fax:  [phone]  Physical Therapy Treatment  Patient Details  Name: [name] MRN: [id number] Date of Birth: 1966-08-25 Referring Provider: Wylene Simmer, MD  As a licensed physical therapist I have read and agree with the below note.  Clarene Critchley PT, DPT 4:29 PM, 03/18/18 [phone]   Encounter Date: 03/18/2018  PT End of Session - 03/18/18 1132    Visit Number  7    Number of Visits  8    Date for PT Re-Evaluation  03/25/18    Authorization Type  Workman's Compensation (Approved for 8 visits)     Authorization Time Period  02/22/18- 03/25/18    Authorization - Visit Number  7    Authorization - Number of Visits  8    PT Start Time  1123    PT Stop Time  1208    PT Time Calculation (min)  45 min    Equipment Utilized During Treatment  -- Lt ankle brace, removed for seated exercises    Activity Tolerance  Patient limited by pain    Behavior During Therapy  WFL for tasks assessed/performed       Past Medical History:  Diagnosis Date  . Arthritis   . Asthma   . Chest pain    cath 2005 norm cors, repeat cath Feb 2011 normal cors and only minimally  elevated pulmonary pressures  . Crohn disease ([place])   . Diverticulosis   . GERD (gastroesophageal reflux disease)   . Hiatal hernia   . Hypertension   . Obesity   . Palpitations   . Sleep apnea sleep study 11/03/2009    cpap; does sometimes, doesnt use every night.    Past Surgical History:  Procedure Laterality Date  . ABDOMINAL HYSTERECTOMY    . ABDOMINAL HYSTERECTOMY    . ANKLE ARTHROSCOPY  06/01/2012   Procedure: ANKLE ARTHROSCOPY;  Surgeon: Colin Rhein, MD;  Location: [facility];  Service: Orthopedics;  Laterality: Left;  left ankle arthorsocpy with extensive debridement and gastroc slide  . CARDIAC CATHETERIZATION  11/22/2009 and 2005   WNL  . CESAREAN SECTION    .  CHOLECYSTECTOMY  09/08/2006   lap. chole.  . CHONDROPLASTY  06/17/2012   Procedure: CHONDROPLASTY;  Surgeon: Carole Civil, MD;  [facility];  Service: Orthopedics;  Laterality: Right;  right patella  . ESOPHAGOGASTRODUODENOSCOPY N/A 05/14/2015   Procedure: ESOPHAGOGASTRODUODENOSCOPY (EGD);  Surgeon: Rogene Houston, MD;  [facility];  Service: Endoscopy;  Laterality: N/A;  730  . INGUINAL HERNIA REPAIR  10/30/2008   right  . KNEE ARTHROSCOPY WITH LATERAL MENISECTOMY Left 12/23/2016   Procedure: LEFT KNEE ARTHROSCOPY WITH LATERAL MENISECTOMY;  Surgeon: Carole Civil, MD;  [facility];  Service: Orthopedics;  Laterality: Left;  . KNEE ARTHROSCOPY WITH MEDIAL MENISECTOMY Left 12/23/2016   Procedure: LEFT KNEE ARTHROSCOPY WITH MEDIAL MENISECTOMY CHONDROPLASTY PATELLA  AND MEDIAL FEMORAL CONDYLE LEFT KNEE;  Surgeon: Carole Civil, MD;  [facility];  Service: Orthopedics;  Laterality: Left;  . SHOULDER ARTHROSCOPY WITH SUBACROMIAL DECOMPRESSION AND BICEP TENDON REPAIR Left 12/21/2017   Procedure: Left shoulder arthroscopic biceps tenodesis, SAD, DCR and labrum debridement;  Surgeon: Nicholes Stairs, MD;  Location: [place];  Service: Orthopedics;  Laterality: Left;  120 mins  . SHOULDER SURGERY     right x 2     There were no vitals  filed for this visit.  Subjective Assessment - 03/18/18 1127    Subjective  Pt stated she thought of cancelling therapy today due to pain, stated she changed shoes 3x today due to pain.  Pain scale 5/10 with increase to 10/10 with weight bearing today.  Reports of increased swelling medial aspect of ankle and stiff but yet painful popping.    Pertinent History  Fall on July 31, 2017 which affected her left side. Left shoulder surgery 12/21/17    Patient Stated Goals  Strengthening that ankle because she feels like her ankle isn't supported    Currently in Pain?  Yes    Pain Score  10-Worst pain ever    Pain Location  Ankle     Pain Orientation  Left    Pain Descriptors / Indicators  Pressure    Pain Type  Acute pain    Pain Onset  More than a month ago    Pain Frequency  Constant    Aggravating Factors   weight bearing    Pain Relieving Factors  rest, pain medication    Effect of Pain on Daily Activities  min effect on ADLs         [facility] PT Assessment - 03/18/18 0001      AROM   Left Ankle Dorsiflexion  8    Left Ankle Plantar Flexion  55    Left Ankle Inversion  23 keeps getting "stuck" in this direciotn    Left Ankle Eversion  20      Strength   Left Ankle Dorsiflexion  4+/5    Left Ankle Plantar Flexion  -- limited by pain    Left Ankle Inversion  4/5    Left Ankle Eversion  4/5                   [facility] Adult PT Treatment/Exercise - 03/18/18 0001      Manual Therapy   Manual Therapy  Soft tissue mobilization;Joint mobilization;Passive ROM;Edema management    Manual therapy comments  completed seperately from all other skilled interventions    Edema Management  Retro massage to medial aspect of ankle     Joint Mobilization  Patient supine Lt. gentle calcaneal distraction 10 x 10 second holds for pain relief     Soft tissue mobilization  Gentle soft tissue mobilization to lateral left ankle for pain control with patient in supine      Ankle Exercises: Seated   Towel Inversion/Eversion  -- 10x with RTB    Towel Inversion/Eversion Limitations  3x each direcitons               PT Short Term Goals - 03/18/18 1151      PT SHORT TERM GOAL #1   Title  Pt will be independent with HEP and perform consistently in order to maximize return to PLOF.    Baseline  03/18/18:  reports daily complaince    Status  Achieved      PT SHORT TERM GOAL #2   Title  Patient will demonstrate improvement in left ankle AROM of 5 degrees in all deficient planes to improve gait mechanics and safety.     Baseline  03/18/18: DF was 1 now 8 degrees; PF was 35 now 55 degrees; Inv was 20 now 23; Ev: was 12  now 20    Status  Partially Met      PT SHORT TERM GOAL #3   Title  Patient will demonstrate improvement of 1/2 MMT  grade in all deficient musculature to assist with proper gait mechanics.     Baseline  03/16/18: partly met; limited by pain to assess PF strengthening    Status  Partially Met        PT Long Term Goals - 03/18/18 1152      PT LONG TERM GOAL #1   Title  Patient will demonstrate improved SLS to 10 seconds on left lower extremity to demonstrate improved stability and proprioception of left ankle and improve gait mechanics.    Baseline  03/18/18: limited by pain 10/10, unable to assess      PT LONG TERM GOAL #2   Title  Patient will demonstrate improvement of 1 MMT grade in all musculature tested as deficient at evaluation.     Baseline  03/18/18: progressing    Status  On-going      PT LONG TERM GOAL #3   Title  Patient will demonstrate ability to ambulate at a gait velocity of 1.0 m/s on 2MWT indicating improved tolerance to ambulation and improved gait mechanics.     Baseline  03/18/18: limited by pain 10/10 with weight bearing, unable to assess    Status  Not Met      PT LONG TERM GOAL #4   Title  Pt will be able to ascend/descend full flight of stairs reciprocally with no handrail and without knee or ankle pain in order to demo improved functional strength and maximize function at home and work.     Baseline  03/18/18:  Limited by pain 10/10, unable to assess    Status  Not Met            Plan - 03/18/18 1213    Clinical Impression Statement  Pt limited by pain with increased to 10/10 with weight bearing.  Noted inflammation on medial aspect of foot today vs lateral aspect.  Began session with manual retrograde massage for pain control and to improve ankle mobility.  Pt continues to have tenderness with palpation over posterior and anterior tib as well as peronal tendons distal to muscle bellies.  Therex focus on ankle mobilty and posterior tib strengthening.  Pt  reports MD apt next week, reviewed goals prior apt.  Pt has improved ankle mobility and improved strengthening in inversion and eversion.  Unable to test gastroc strengthening due to pain.  Pt reports compliance with HEP daily and able to demonstrate and verbalize appropraite mechanics with all exercises.  Pt continues to be limited by pain especially with weight bearing, unable to assess 2MWT and stair mechanics this session.      Rehab Potential  Fair    Clinical Impairments Affecting Rehab Potential  Positive: Motivated and positive attitude; Negative: Chronicity of issue    PT Frequency  2x / week    PT Duration  4 weeks    PT Treatment/Interventions  ADLs/Self Care Home Management;Cryotherapy;Electrical Stimulation;DME Instruction;Gait training;Stair training;Functional mobility training;Therapeutic activities;Therapeutic exercise;Balance training;Neuromuscular re-education;Patient/family education;Manual techniques;Passive range of motion;Dry needling;Energy conservation;Splinting;Taping;Orthotic Fit/Training;Ultrasound    PT Next Visit Plan  F/U with MD next week (6/18).  Pt is allergic to essential oils and very strong smelling lotions but she is fine with the massage cream/oil used in clinic; continue isometrics for ankle strength; assess reaction to tandem stance balance exercise today. Physician order stated to continue strengthening for posterior tibial tendon. Manual for pain reduction and edema reduction as needed. continue to try and correct calcaneal eversion. Follow-up on referral from physician about orthotics.  PT Home Exercise Plan  5/23: ABCs; 03/09/18: Seated heel raises and toe raises x 10 reps 1x/day       Patient will benefit from skilled therapeutic intervention in order to improve the following deficits and impairments:  Abnormal gait, Decreased balance, Decreased endurance, Decreased mobility, Difficulty walking, Hypomobility, Impaired sensation, Decreased range of motion,  Increased edema, Decreased activity tolerance, Decreased strength, Pain  Visit Diagnosis: Pain in left ankle and joints of left foot  Muscle weakness (generalized)  Other abnormalities of gait and mobility     Problem List Patient Active Problem List   Diagnosis Date Noted  . Derangement of anterior horn of lateral meniscus of left knee   . Derangement of posterior horn of medial meniscus of left knee   . Chest pain 04/25/2016  . Hypokalemia 04/25/2016  . Hyperglycemia 04/25/2016  . Diverticulitis of colon 07/17/2014  . Nausea without vomiting 07/17/2014  . Crohn disease ([place]) 07/17/2014  . Diverticulitis of colon without hemorrhage 08/10/2013  . History of arthroscopy of right knee 07/25/2012  . Difficulty in walking(719.7) 06/29/2012  . Weakness of right leg 06/29/2012  . Posterior tibial tendonitis 11/11/2011  . PTTD (posterior tibial tendon dysfunction) 11/11/2011  . Knee pain, right 11/11/2011  . Chest pain 07/16/2011  . Primary osteoarthritis of left knee 02/25/2010  . PAIN IN JOINT, MULTIPLE SITES 02/25/2010  . Essential hypertension 08/13/2009  . HIATAL HERNIA 08/13/2009  . PALPITATIONS 08/13/2009  . DYSPNEA 08/13/2009   Ihor Austin, [place]; [place]  Aldona Lento 03/18/2018, 12:23 PM  [facility] [address] [place], [place], [address] Phone: [phone]   Fax:  [phone]  Name: [name] MRN: [id number] Date of Birth: 06-28-1966

## 2018-03-21 ENCOUNTER — Encounter (HOSPITAL_COMMUNITY): Payer: Self-pay | Admitting: Specialist

## 2018-03-21 ENCOUNTER — Ambulatory Visit (HOSPITAL_COMMUNITY): Payer: PRIVATE HEALTH INSURANCE | Attending: Orthopedic Surgery | Admitting: Specialist

## 2018-03-21 DIAGNOSIS — M25612 Stiffness of left shoulder, not elsewhere classified: Secondary | ICD-10-CM | POA: Insufficient documentation

## 2018-03-21 DIAGNOSIS — M25512 Pain in left shoulder: Secondary | ICD-10-CM | POA: Diagnosis present

## 2018-03-21 DIAGNOSIS — R29898 Other symptoms and signs involving the musculoskeletal system: Secondary | ICD-10-CM | POA: Insufficient documentation

## 2018-03-21 NOTE — Therapy (Addendum)
[place], [place], [address] Phone: [phone]   Fax:  [phone]  Occupational Therapy Treatment  Patient Details  Name: [name] MRN: [id number] Date of Birth: Jul 28, 1966 Referring Provider: Wylene Simmer, MD   Encounter Date: 03/21/2018  OT End of Session - 03/21/18 1315    Visit Number  17    Number of Visits  24    Date for OT Re-Evaluation  04/05/18    Authorization Type  Workers compensation approved 16 visits for her shoulder. On 03/11/18, WC approved an additional 8 visits for her shoulder     Authorization - Visit Number  17    Authorization - Number of Visits  24    OT Start Time  1042    OT Stop Time  1122    OT Time Calculation (min)  40 min    Activity Tolerance  Patient tolerated treatment well    Behavior During Therapy  WFL for tasks assessed/performed       Past Medical History:  Diagnosis Date  . Arthritis   . Asthma   . Chest pain    cath 2005 norm cors, repeat cath Feb 2011 normal cors and only minimally  elevated pulmonary pressures  . Crohn disease ([place])   . Diverticulosis   . GERD (gastroesophageal reflux disease)   . Hiatal hernia   . Hypertension   . Obesity   . Palpitations   . Sleep apnea sleep study 11/03/2009    cpap; does sometimes, doesnt use every night.    Past Surgical History:  Procedure Laterality Date  . ABDOMINAL HYSTERECTOMY    . ABDOMINAL HYSTERECTOMY    . ANKLE ARTHROSCOPY  06/01/2012   Procedure: ANKLE ARTHROSCOPY;  Surgeon: Colin Rhein, MD;  Location: [facility];  Service: Orthopedics;  Laterality: Left;  left ankle arthorsocpy with extensive debridement and gastroc slide  . CARDIAC CATHETERIZATION  11/22/2009 and 2005   WNL  . CESAREAN SECTION    . CHOLECYSTECTOMY  09/08/2006   lap. chole.  . CHONDROPLASTY  06/17/2012   Procedure: CHONDROPLASTY;  Surgeon: Carole Civil, MD;  [facility]: [facility];  Service: Orthopedics;   Laterality: Right;  right patella  . ESOPHAGOGASTRODUODENOSCOPY N/A 05/14/2015   Procedure: ESOPHAGOGASTRODUODENOSCOPY (EGD);  Surgeon: Rogene Houston, MD;  [facility];  Service: Endoscopy;  Laterality: N/A;  730  . INGUINAL HERNIA REPAIR  10/30/2008   right  . KNEE ARTHROSCOPY WITH LATERAL MENISECTOMY Left 12/23/2016   Procedure: LEFT KNEE ARTHROSCOPY WITH LATERAL MENISECTOMY;  Surgeon: Carole Civil, MD;  [facility];  Service: Orthopedics;  Laterality: Left;  . KNEE ARTHROSCOPY WITH MEDIAL MENISECTOMY Left 12/23/2016   Procedure: LEFT KNEE ARTHROSCOPY WITH MEDIAL MENISECTOMY CHONDROPLASTY PATELLA  AND MEDIAL FEMORAL CONDYLE LEFT KNEE;  Surgeon: Carole Civil, MD;  [facility];  Service: Orthopedics;  Laterality: Left;  . SHOULDER ARTHROSCOPY WITH SUBACROMIAL DECOMPRESSION AND BICEP TENDON REPAIR Left 12/21/2017   Procedure: Left shoulder arthroscopic biceps tenodesis, SAD, DCR and labrum debridement;  Surgeon: Nicholes Stairs, MD;  Location: [facility];  Service: Orthopedics;  Laterality: Left;  120 mins  . SHOULDER SURGERY     right x 2     There were no vitals filed for this visit.  Subjective Assessment - 03/21/18 1313    Subjective   S:  I go back to the MD on Thursday.     Currently in Pain?  Yes  Pain Score  4     Pain Location  Shoulder    Pain Orientation  Left    Pain Descriptors / Indicators  Aching    Pain Type  Acute pain    Pain Onset  More than a month ago    Pain Frequency  Constant         [facility] OT Assessment - 03/21/18 0001      Assessment   Medical Diagnosis  Left shoulder SLAP, debridement, and bicep tenodesis      Precautions   Precautions  Shoulder    Type of Shoulder Precautions  Follow Standard SLAP repair protocol. No lifting over 5#               OT Treatments/Exercises (OP) - 03/21/18 0001      Exercises   Exercises  Shoulder      Shoulder Exercises: Supine   Protraction  PROM;5 reps;AROM;15 reps     Horizontal ABduction  PROM;5 reps;AROM;15 reps    External Rotation  PROM;5 reps;AROM;15 reps    Internal Rotation  PROM;5 reps;AROM;15 reps    Flexion  PROM;5 reps;AROM;15 reps    ABduction  PROM;5 reps;AROM;15 reps      Shoulder Exercises: Seated   Extension  Theraband;10 reps    Theraband Level (Shoulder Extension)  Level 3 (Green)    Retraction  Theraband;10 reps    Theraband Level (Shoulder Retraction)  Level 3 (Green)    Row  [employer] reps    Theraband Level (Shoulder Row)  Level 3 (Green)    Protraction  AROM;15 reps    Horizontal ABduction  AROM;15 reps    External Rotation  AROM;Theraband;15 reps    Theraband Level (Shoulder External Rotation)  Level 3 (Green)    Internal Rotation  AROM;Theraband;15 reps    Theraband Level (Shoulder Internal Rotation)  Level 3 (Green)    Flexion  AROM;15 reps    Abduction  AROM;15 reps      Shoulder Exercises: Sidelying   External Rotation  AROM;15 reps    Internal Rotation  AROM;15 reps    Flexion  AROM;15 reps    ABduction  AROM;15 reps    Other Sidelying Exercises  protraction 15 reps AROM    Other Sidelying Exercises  horizontal abduction, A/ROM, 15X      Shoulder Exercises: ROM/Strengthening   UBE (Upper Arm Bike)  level 2 3' reverse at 3.5 speed    "W" Arms  10 times     X to V Arms  10 times    Proximal Shoulder Strengthening, Supine  12X with no rest breaks    Proximal Shoulder Strengthening, Seated  10X each no rest breaks    Ball on Wall  60 seconds in flexion and 30 seconds in abduction      Manual Therapy   Manual Therapy  Myofascial release    Manual therapy comments  completed seperately from all other skilled interventions    Myofascial Release  myofascial release to left shoulder, trapezius, scapular, and upper arm region to decrease pain and improve pain free mobility.                    04/06/18 1458  OT [place] #1  Title Patient will be educated and independent with HEP for left shoulder  mobility for improved abiilty to return to normal work and daily tasks.   Time 6  Period Weeks  Status Achieved  OT SHORT TERM GOAL #2  Title  patient will improve Left shoulder P/ROM to WNL to increase ability to complete dressing tasks with less difficulty.   Time 6  Period Weeks  Status On-going  OT SHORT TERM GOAL #3  Title Patient will increase left shoulder strength to 3+/5 in order to complete lightweight household actiivities with LUE.   Time 6  Status Partially Met  OT SHORT TERM GOAL #4  Title Patient will decrease pain level in left shoulder to 5/10 with daily task completion.   Time 6  Period Weeks  Status Achieved  OT SHORT TERM GOAL #5  Title Patient will decrease fascial restrictions in LUE to mod amount in order to increase functional mobility needed to complete overhead and reaching tasks.   Time 6  Period Weeks  Status Achieved     04/06/18 1500  OT LONG TERM GOAL #1  Title Patient will return to highest level of independence with her LUE in order to return to work and complete required tasks.   Time 12  Period Weeks  Status On-going  OT LONG TERM GOAL #2  Title patient will increase A/ROM of LUE to WNL to increase ability to complete over head reaching tasks without difficulty.   Time 12  Period Weeks  Status On-going  OT LONG TERM GOAL #3  Title Patient will increase LUE strength to 4+/5 to increase ability to complete chest compressions during CPR when needed.   Time 12  Period Weeks  Status On-going  OT LONG TERM GOAL #4  Title Patient will decrease fascial restrictions to min amount or less in order to increase functional mobility needed for overhead and reaching tasks.   Time 12  Period Weeks  Status On-going  OT LONG TERM GOAL #5  Title Patient will report a decreased level of pain of approximately 2/10 when completing work or daily tasks.   Time 12  Period Weeks  Status On-going            Plan - 03/21/18 1316    Clinical  Impression Statement  A:  Patient has most difficulty with standing A/ROM.  Added ball on wall and external/internal rotation with theraband for improved scapular stability needed for completion of overhead functional tasks.      Plan  P:  Mini reassessment for MD appointment on Thursday.  Attempt strengthening in supine.         Patient will benefit from skilled therapeutic intervention in order to improve the following deficits and impairments:  Pain, Decreased range of motion, Impaired UE functional use, Increased fascial restrictions, Decreased strength, Impaired flexibility  Visit Diagnosis: Other symptoms and signs involving the musculoskeletal system  Acute pain of left shoulder  Stiffness of left shoulder, not elsewhere classified    Problem List Patient Active Problem List   Diagnosis Date Noted  . Derangement of anterior horn of lateral meniscus of left knee   . Derangement of posterior horn of medial meniscus of left knee   . Chest pain 04/25/2016  . Hypokalemia 04/25/2016  . Hyperglycemia 04/25/2016  . Diverticulitis of colon 07/17/2014  . Nausea without vomiting 07/17/2014  . Crohn disease ([place]) 07/17/2014  . Diverticulitis of colon without hemorrhage 08/10/2013  . History of arthroscopy of right knee 07/25/2012  . Difficulty in walking(719.7) 06/29/2012  . Weakness of right leg 06/29/2012  . Posterior tibial tendonitis 11/11/2011  . PTTD (posterior tibial tendon dysfunction) 11/11/2011  . Knee pain, right 11/11/2011  . Chest pain 07/16/2011  . Primary osteoarthritis of left knee  02/25/2010  . PAIN IN JOINT, MULTIPLE SITES 02/25/2010  . Essential hypertension 08/13/2009  . HIATAL HERNIA 08/13/2009  . PALPITATIONS 08/13/2009  . DYSPNEA 08/13/2009    Vangie Bicker, [place], OTR/L [phone]  03/21/2018, 1:23 PM  [place], [place], [address] Phone: [phone]   Fax:  [phone]  Name:  [name] MRN: [id number] Date of Birth: Feb 22, 1966

## 2018-03-22 MED FILL — MONTELUKAST SOD 10 MG TAB: 10 | 30 days supply | Qty: 30 | Fill #0

## 2018-03-22 MED FILL — GABAPENTIN 300 MG CAPSULE: 300 | 30 days supply | Qty: 30 | Fill #2

## 2018-03-23 ENCOUNTER — Ambulatory Visit (HOSPITAL_COMMUNITY): Payer: 59

## 2018-03-24 ENCOUNTER — Encounter (HOSPITAL_COMMUNITY): Payer: Self-pay | Admitting: Occupational Therapy

## 2018-03-24 ENCOUNTER — Ambulatory Visit (HOSPITAL_COMMUNITY): Payer: PRIVATE HEALTH INSURANCE | Attending: Orthopedic Surgery | Admitting: Occupational Therapy

## 2018-03-24 DIAGNOSIS — R29898 Other symptoms and signs involving the musculoskeletal system: Secondary | ICD-10-CM

## 2018-03-24 DIAGNOSIS — M25612 Stiffness of left shoulder, not elsewhere classified: Secondary | ICD-10-CM | POA: Diagnosis present

## 2018-03-24 DIAGNOSIS — M25512 Pain in left shoulder: Secondary | ICD-10-CM | POA: Diagnosis present

## 2018-03-24 MED FILL — LOSARTAN POTASSIUM 100 MG T: 100 | 90 days supply | Qty: 90 | Fill #0

## 2018-03-24 MED FILL — AMLODIPINE BESYLATE 5 MG TA: 5 | 90 days supply | Qty: 90 | Fill #0

## 2018-03-24 NOTE — Therapy (Addendum)
[place], [place], [address] Phone: [phone]   Fax:  [phone]  Occupational Therapy Treatment  Patient Details  Name: [name] MRN: [id number] Date of Birth: 16-Feb-1966 Referring Provider: Wylene Simmer, MD   Encounter Date: 03/24/2018  OT End of Session - 03/24/18 1621    Visit Number  18    Number of Visits  24    Date for OT Re-Evaluation  04/05/18    Authorization Type  Workers compensation approved 16 visits for her shoulder. On 03/11/18, WC approved an additional 8 visits for her shoulder     Authorization - Visit Number  18    Authorization - Number of Visits  24    OT Start Time  6962    OT Stop Time  1646    OT Time Calculation (min)  43 min    Activity Tolerance  Patient tolerated treatment well    Behavior During Therapy  WFL for tasks assessed/performed       Past Medical History:  Diagnosis Date  . Arthritis   . Asthma   . Chest pain    cath 2005 norm cors, repeat cath Feb 2011 normal cors and only minimally  elevated pulmonary pressures  . Crohn disease ([place])   . Diverticulosis   . GERD (gastroesophageal reflux disease)   . Hiatal hernia   . Hypertension   . Obesity   . Palpitations   . Sleep apnea sleep study 11/03/2009    cpap; does sometimes, doesnt use every night.    Past Surgical History:  Procedure Laterality Date  . ABDOMINAL HYSTERECTOMY    . ABDOMINAL HYSTERECTOMY    . ANKLE ARTHROSCOPY  06/01/2012   Procedure: ANKLE ARTHROSCOPY;  Surgeon: Colin Rhein, MD;  Location: [facility];  Service: Orthopedics;  Laterality: Left;  left ankle arthorsocpy with extensive debridement and gastroc slide  . CARDIAC CATHETERIZATION  11/22/2009 and 2005   WNL  . CESAREAN SECTION    . CHOLECYSTECTOMY  09/08/2006   lap. chole.  . CHONDROPLASTY  06/17/2012   Procedure: CHONDROPLASTY;  Surgeon: Carole Civil, MD;  [facility];  Service: Orthopedics;   Laterality: Right;  right patella  . ESOPHAGOGASTRODUODENOSCOPY N/A 05/14/2015   Procedure: ESOPHAGOGASTRODUODENOSCOPY (EGD);  Surgeon: Rogene Houston, MD;  [facility];  Service: Endoscopy;  Laterality: N/A;  730  . INGUINAL HERNIA REPAIR  10/30/2008   right  . KNEE ARTHROSCOPY WITH LATERAL MENISECTOMY Left 12/23/2016   Procedure: LEFT KNEE ARTHROSCOPY WITH LATERAL MENISECTOMY;  Surgeon: Carole Civil, MD;  [facility];  Service: Orthopedics;  Laterality: Left;  . KNEE ARTHROSCOPY WITH MEDIAL MENISECTOMY Left 12/23/2016   Procedure: LEFT KNEE ARTHROSCOPY WITH MEDIAL MENISECTOMY CHONDROPLASTY PATELLA  AND MEDIAL FEMORAL CONDYLE LEFT KNEE;  Surgeon: Carole Civil, MD;  [facility];  Service: Orthopedics;  Laterality: Left;  . SHOULDER ARTHROSCOPY WITH SUBACROMIAL DECOMPRESSION AND BICEP TENDON REPAIR Left 12/21/2017   Procedure: Left shoulder arthroscopic biceps tenodesis, SAD, DCR and labrum debridement;  Surgeon: Nicholes Stairs, MD;  Location: [facility];  Service: Orthopedics;  Laterality: Left;  120 mins  . SHOULDER SURGERY     right x 2     There were no vitals filed for this visit.  Subjective Assessment - 03/24/18 1604    Subjective   S: It was hurting more this morning but it's a little better now.     Currently in Pain?  Yes    Pain Score  4     Pain Location  Shoulder    Pain Orientation  Left    Pain Descriptors / Indicators  Aching;Sore    Pain Type  Acute pain    Pain Radiating Towards  none    Pain Onset  More than a month ago    Pain Frequency  Constant    Aggravating Factors   weight bearing    Pain Relieving Factors  rest, pain medication    Effect of Pain on Daily Activities  min effect on ADLs    Multiple Pain Sites  No         [facility] OT Assessment - 03/24/18 1603      Assessment   Medical Diagnosis  Left shoulder SLAP, debridement, and bicep tenodesis      Precautions   Precautions  Shoulder    Type of Shoulder Precautions   Follow Standard SLAP repair protocol. No lifting over 5#               OT Treatments/Exercises (OP) - 03/24/18 1605      Exercises   Exercises  Shoulder      Shoulder Exercises: Supine   Protraction  PROM;5 reps;Strengthening;10 reps    Protraction Weight (lbs)  1    Horizontal ABduction  PROM;5 reps;Strengthening;10 reps    Horizontal ABduction Weight (lbs)  1    External Rotation  PROM;5 reps;Strengthening;10 reps    External Rotation Weight (lbs)  1    Internal Rotation  PROM;5 reps;Strengthening;10 reps    Internal Rotation Weight (lbs)  1    Flexion  PROM;5 reps;Strengthening;10 reps    Shoulder Flexion Weight (lbs)  1    ABduction  PROM;5 reps;Strengthening;10 reps    Shoulder ABduction Weight (lbs)  1      Shoulder Exercises: Seated   Extension  Theraband;10 reps    Theraband Level (Shoulder Extension)  Level 3 (Green)    Retraction  Theraband;10 reps    Theraband Level (Shoulder Retraction)  Level 3 (Green)    Row  [employer] reps    Theraband Level (Shoulder Row)  Level 3 (Green)    Protraction  AROM;15 reps    Horizontal ABduction  AROM;15 reps    External Rotation  AROM;Theraband;15 reps    Theraband Level (Shoulder External Rotation)  Level 3 (Green)    Internal Rotation  AROM;15 reps    Flexion  AROM;15 reps    Abduction  AROM;15 reps      Shoulder Exercises: ROM/Strengthening   X to V Arms  15X    Ball on Wall  1' flexion      Manual Therapy   Manual Therapy  Myofascial release    Manual therapy comments  completed seperately from all other skilled interventions    Myofascial Release  myofascial release to left shoulder, trapezius, scapular, and upper arm region to decrease pain and improve pain free mobility.           OT Short Term Goals - 04/05/18 1628            OT SHORT TERM GOAL #1   Title  Patient will be educated and independent with HEP for left shoulder mobility for improved abiilty to return to normal work and daily tasks.      Time  6    Period  Weeks    Status  Achieved        OT SHORT TERM GOAL #2  Title  patient will improve Left shoulder P/ROM to WNL to increase ability to complete dressing tasks with less difficulty.     Time  6    Period  Weeks    Status  On-going        OT SHORT TERM GOAL #3   Title  Patient will increase left shoulder strength to 3+/5 in order to complete lightweight household actiivities with LUE.     Time  6    Period  Weeks    Status  Partially Met        OT SHORT TERM GOAL #4   Title  Patient will decrease pain level in left shoulder to 5/10 with daily task completion.     Time  6    Period  Weeks    Status  Achieved        OT SHORT TERM GOAL #5   Title  Patient will decrease fascial restrictions in LUE to mod amount in order to increase functional mobility needed to complete overhead and reaching tasks.     Time  6    Period  Weeks    Status  Achieved       OT Long Term Goals - 04/05/18 1629            OT LONG TERM GOAL #1   Title  Patient will return to highest level of independence with her LUE in order to return to work and complete required tasks.     Time  12    Period  Weeks    Status  On-going        OT LONG TERM GOAL #2   Title  patient will increase A/ROM of LUE to WNL to increase ability to complete over head reaching tasks without difficulty.     Time  12    Period  Weeks    Status  On-going        OT LONG TERM GOAL #3   Title  Patient will increase LUE strength to 4+/5 to increase ability to complete chest compressions during CPR when needed.     Time  12    Period  Weeks    Status  On-going        OT LONG TERM GOAL #4   Title  Patient will decrease fascial restrictions to min amount or less in order to increase functional mobility needed for overhead and reaching tasks.     Time  12    Period  Weeks    Status  On-going        OT LONG TERM GOAL #5   Title  Patient will report a  decreased level of pain of approximately 2/10 when completing work or daily tasks.     Time  12    Period  Weeks    Status  On-going                      Plan - 03/24/18 1621    Clinical Impression Statement  A: Did not measure as pt's MD appt has been canceled. Continued with manual therapy to address fascial restrictions limiting ROM. Progressed to strengthening with 1# weight in supine, continued with A/ROM in standing. Pt's ROM is WFL, continues to have difficulty with reaching overhead. Verbal cuing for form and technique.     Plan  P: Resume sidelying exercises, work on scapular mobility and stability       Patient will benefit from skilled therapeutic  intervention in order to improve the following deficits and impairments:  Pain, Decreased range of motion, Impaired UE functional use, Increased fascial restrictions, Decreased strength, Impaired flexibility  Visit Diagnosis: Other symptoms and signs involving the musculoskeletal system  Acute pain of left shoulder  Stiffness of left shoulder, not elsewhere classified    Problem List Patient Active Problem List   Diagnosis Date Noted  . Derangement of anterior horn of lateral meniscus of left knee   . Derangement of posterior horn of medial meniscus of left knee   . Chest pain 04/25/2016  . Hypokalemia 04/25/2016  . Hyperglycemia 04/25/2016  . Diverticulitis of colon 07/17/2014  . Nausea without vomiting 07/17/2014  . Crohn disease ([place]) 07/17/2014  . Diverticulitis of colon without hemorrhage 08/10/2013  . History of arthroscopy of right knee 07/25/2012  . Difficulty in walking(719.7) 06/29/2012  . Weakness of right leg 06/29/2012  . Posterior tibial tendonitis 11/11/2011  . PTTD (posterior tibial tendon dysfunction) 11/11/2011  . Knee pain, right 11/11/2011  . Chest pain 07/16/2011  . Primary osteoarthritis of left knee 02/25/2010  . PAIN IN JOINT, MULTIPLE SITES 02/25/2010  . Essential  hypertension 08/13/2009  . HIATAL HERNIA 08/13/2009  . PALPITATIONS 08/13/2009  . DYSPNEA 08/13/2009   Guadelupe Sabin, OTR/L  [phone] 03/24/2018, 5:45 PM  [facility] [address] [place], [place], [address] Phone: [phone]   Fax:  [phone]  Name: CARRELL PALMATIER MRN: [id number] Date of Birth: 1966/04/12

## 2018-03-29 ENCOUNTER — Ambulatory Visit (HOSPITAL_COMMUNITY): Payer: PRIVATE HEALTH INSURANCE | Attending: Family Medicine | Admitting: Occupational Therapy

## 2018-03-29 ENCOUNTER — Encounter (HOSPITAL_COMMUNITY): Payer: Self-pay | Admitting: Occupational Therapy

## 2018-03-29 DIAGNOSIS — M25512 Pain in left shoulder: Secondary | ICD-10-CM | POA: Diagnosis not present

## 2018-03-29 DIAGNOSIS — M25612 Stiffness of left shoulder, not elsewhere classified: Secondary | ICD-10-CM | POA: Insufficient documentation

## 2018-03-29 DIAGNOSIS — R29898 Other symptoms and signs involving the musculoskeletal system: Secondary | ICD-10-CM | POA: Diagnosis not present

## 2018-03-29 NOTE — Therapy (Addendum)
[place], [place], [address] Phone: [phone]   Fax:  [phone]  Occupational Therapy Treatment  Patient Details  Name: [name] MRN: [id number] Date of Birth: [birth year] Referring Provider: Wylene Simmer, MD   Encounter Date: 03/29/2018  OT End of Session - 03/29/18 1726    Visit Number  19    Number of Visits  24    Date for OT Re-Evaluation  04/05/18    Authorization Type  Workers compensation approved 16 visits for her shoulder. On 03/11/18, WC approved an additional 8 visits for her shoulder     Authorization - Visit Number  19    Authorization - Number of Visits  24    OT Start Time  6789    OT Stop Time  1726    OT Time Calculation (min)  40 min    Activity Tolerance  Patient tolerated treatment well    Behavior During Therapy  WFL for tasks assessed/performed       Past Medical History:  Diagnosis Date  . Arthritis   . Asthma   . Chest pain    cath 2005 norm cors, repeat cath Feb 2011 normal cors and only minimally  elevated pulmonary pressures  . Crohn disease ([place])   . Diverticulosis   . GERD (gastroesophageal reflux disease)   . Hiatal hernia   . Hypertension   . Obesity   . Palpitations   . Sleep apnea sleep study 11/03/2009    cpap; does sometimes, doesnt use every night.    Past Surgical History:  Procedure Laterality Date  . ABDOMINAL HYSTERECTOMY    . ABDOMINAL HYSTERECTOMY    . ANKLE ARTHROSCOPY  06/01/2012   Procedure: ANKLE ARTHROSCOPY;  Surgeon: Colin Rhein, MD;  Location: [facility];  Service: Orthopedics;  Laterality: Left;  left ankle arthorsocpy with extensive debridement and gastroc slide  . CARDIAC CATHETERIZATION  11/22/2009 and 2005   WNL  . CESAREAN SECTION    . CHOLECYSTECTOMY  09/08/2006   lap. chole.  . CHONDROPLASTY  06/17/2012   Procedure: CHONDROPLASTY;  Surgeon: Carole Civil, MD;  [facility]: [facility];  Service: Orthopedics;   Laterality: Right;  right patella  . ESOPHAGOGASTRODUODENOSCOPY N/A 05/14/2015   Procedure: ESOPHAGOGASTRODUODENOSCOPY (EGD);  Surgeon: Rogene Houston, MD;  [facility];  Service: Endoscopy;  Laterality: N/A;  730  . INGUINAL HERNIA REPAIR  10/30/2008   right  . KNEE ARTHROSCOPY WITH LATERAL MENISECTOMY Left 12/23/2016   Procedure: LEFT KNEE ARTHROSCOPY WITH LATERAL MENISECTOMY;  Surgeon: Carole Civil, MD;  Location: [facility];  Service: Orthopedics;  Laterality: Left;  . KNEE ARTHROSCOPY WITH MEDIAL MENISECTOMY Left 12/23/2016   Procedure: LEFT KNEE ARTHROSCOPY WITH MEDIAL MENISECTOMY CHONDROPLASTY PATELLA  AND MEDIAL FEMORAL CONDYLE LEFT KNEE;  Surgeon: Carole Civil, MD;  Location: [facility];  Service: Orthopedics;  Laterality: Left;  . SHOULDER ARTHROSCOPY WITH SUBACROMIAL DECOMPRESSION AND BICEP TENDON REPAIR Left 12/21/2017   Procedure: Left shoulder arthroscopic biceps tenodesis, SAD, DCR and labrum debridement;  Surgeon: Nicholes Stairs, MD;  Location: [facility];  Service: Orthopedics;  Laterality: Left;  120 mins  . SHOULDER SURGERY     right x 2     There were no vitals filed for this visit.  Subjective Assessment - 03/29/18 1646    Subjective   S: It's doing ok so far today, pain is low today.     Currently in Pain?  Yes  Pain Score  2     Pain Location  Shoulder    Pain Orientation  Left    Pain Descriptors / Indicators  Aching;Sore    Pain Type  Acute pain    Pain Radiating Towards  none    Pain Onset  More than a month ago    Pain Frequency  Constant    Aggravating Factors   weight bearing    Pain Relieving Factors  rest, pain medication    Effect of Pain on Daily Activities  min effect on ADLs         [facility] OT Assessment - 03/29/18 1645      Assessment   Medical Diagnosis  Left shoulder SLAP, debridement, and bicep tenodesis      Precautions   Precautions  Shoulder    Type of Shoulder Precautions  Follow Standard SLAP repair protocol. No  lifting over 5#               OT Treatments/Exercises (OP) - 03/29/18 1646      Exercises   Exercises  Shoulder      Shoulder Exercises: Supine   Protraction  PROM;5 reps;Strengthening;10 reps    Protraction Weight (lbs)  1    Horizontal ABduction  PROM;5 reps;Strengthening;10 reps    Horizontal ABduction Weight (lbs)  1    External Rotation  PROM;5 reps;Strengthening;10 reps    External Rotation Weight (lbs)  1    Internal Rotation  PROM;5 reps;Strengthening;10 reps    Internal Rotation Weight (lbs)  1    Flexion  PROM;5 reps;Strengthening;10 reps    Shoulder Flexion Weight (lbs)  1    ABduction  PROM;5 reps;Strengthening;10 reps    Shoulder ABduction Weight (lbs)  1      Shoulder Exercises: Seated   Extension  Theraband;10 reps    Theraband Level (Shoulder Extension)  Level 3 (Green)    Retraction  Theraband;10 reps    Theraband Level (Shoulder Retraction)  Level 3 (Green)    Row  [employer] reps    Theraband Level (Shoulder Row)  Level 3 (Green)    Protraction  Theraband;10 reps    Theraband Level (Shoulder Protraction)  Level 2 (Red)    Horizontal ABduction  Theraband;10 reps    Theraband Level (Shoulder Horizontal ABduction)  Level 2 (Red)    External Rotation  Theraband;10 reps    Theraband Level (Shoulder External Rotation)  Level 2 (Red)    Flexion  Theraband;10 reps    Theraband Level (Shoulder Flexion)  Level 2 (Red)    Abduction  Theraband;10 reps    Theraband Level (Shoulder ABduction)  Level 2 (Red)      Shoulder Exercises: Sidelying   External Rotation  AROM;15 reps    Internal Rotation  AROM;15 reps    Flexion  AROM;15 reps    ABduction  AROM;15 reps    Other Sidelying Exercises  protraction 15 reps AROM    Other Sidelying Exercises  horizontal abduction, A/ROM, 15X      Manual Therapy   Manual Therapy  Myofascial release    Manual therapy comments  completed seperately from all other skilled interventions    Myofascial Release  myofascial  release to left shoulder, trapezius, scapular, and upper arm region to decrease pain and improve pain free mobility.          OT Short Term Goals - 04/05/18 1628            OT SHORT TERM GOAL #1  Title  Patient will be educated and independent with HEP for left shoulder mobility for improved abiilty to return to normal work and daily tasks.     Time  6    Period  Weeks    Status  Achieved        OT SHORT TERM GOAL #2   Title  patient will improve Left shoulder P/ROM to WNL to increase ability to complete dressing tasks with less difficulty.     Time  6    Period  Weeks    Status  On-going        OT SHORT TERM GOAL #3   Title  Patient will increase left shoulder strength to 3+/5 in order to complete lightweight household actiivities with LUE.     Time  6    Period  Weeks    Status  Partially Met        OT SHORT TERM GOAL #4   Title  Patient will decrease pain level in left shoulder to 5/10 with daily task completion.     Time  6    Period  Weeks    Status  Achieved        OT SHORT TERM GOAL #5   Title  Patient will decrease fascial restrictions in LUE to mod amount in order to increase functional mobility needed to complete overhead and reaching tasks.     Time  6    Period  Weeks    Status  Achieved       OT Long Term Goals - 04/05/18 1629            OT LONG TERM GOAL #1   Title  Patient will return to highest level of independence with her LUE in order to return to work and complete required tasks.     Time  12    Period  Weeks    Status  On-going        OT LONG TERM GOAL #2   Title  patient will increase A/ROM of LUE to WNL to increase ability to complete over head reaching tasks without difficulty.     Time  12    Period  Weeks    Status  On-going        OT LONG TERM GOAL #3   Title  Patient will increase LUE strength to 4+/5 to increase ability to complete chest compressions during CPR when needed.     Time   12    Period  Weeks    Status  On-going        OT LONG TERM GOAL #4   Title  Patient will decrease fascial restrictions to min amount or less in order to increase functional mobility needed for overhead and reaching tasks.     Time  12    Period  Weeks    Status  On-going        OT LONG TERM GOAL #5   Title  Patient will report a decreased level of pain of approximately 2/10 when completing work or daily tasks.     Time  12    Period  Weeks    Status  On-going                       Plan - 03/29/18 1728    Clinical Impression Statement  A: Continued with manual therapy today to address fascial restrictions in upper trapezius and scapularis regions. Continued with 1# weights  in supine, resumed sidelying exercises, and added red theraband strengthening while seated. Mod difficulty with new theraband exercsies, verbal cuing for form and technique. Occasional rest breaks for fatigue.     Plan  P: Continue with seated strengthening with theraband working on improving form, add ball on wall for scapular stability       Patient will benefit from skilled therapeutic intervention in order to improve the following deficits and impairments:  Pain, Decreased range of motion, Impaired UE functional use, Increased fascial restrictions, Decreased strength, Impaired flexibility  Visit Diagnosis: Other symptoms and signs involving the musculoskeletal system  Acute pain of left shoulder  Stiffness of left shoulder, not elsewhere classified    Problem List Patient Active Problem List   Diagnosis Date Noted  . Derangement of anterior horn of lateral meniscus of left knee   . Derangement of posterior horn of medial meniscus of left knee   . Chest pain 04/25/2016  . Hypokalemia 04/25/2016  . Hyperglycemia 04/25/2016  . Diverticulitis of colon 07/17/2014  . Nausea without vomiting 07/17/2014  . Crohn disease ([place]) 07/17/2014  . Diverticulitis of colon without  hemorrhage 08/10/2013  . History of arthroscopy of right knee 07/25/2012  . Difficulty in walking(719.7) 06/29/2012  . Weakness of right leg 06/29/2012  . Posterior tibial tendonitis 11/11/2011  . PTTD (posterior tibial tendon dysfunction) 11/11/2011  . Knee pain, right 11/11/2011  . Chest pain 07/16/2011  . Primary osteoarthritis of left knee 02/25/2010  . PAIN IN JOINT, MULTIPLE SITES 02/25/2010  . Essential hypertension 08/13/2009  . HIATAL HERNIA 08/13/2009  . PALPITATIONS 08/13/2009  . DYSPNEA 08/13/2009   Guadelupe Sabin, OTR/L  [phone] 03/29/2018, 5:31 PM  [facility] [address] [place], [place], [address] Phone: [phone]   Fax:  [phone]  Name: [name] MRN: [id number] Date of Birth: [birth year]

## 2018-03-30 ENCOUNTER — Encounter (HOSPITAL_COMMUNITY): Payer: Self-pay | Admitting: Occupational Therapy

## 2018-03-30 ENCOUNTER — Ambulatory Visit (HOSPITAL_COMMUNITY): Payer: PRIVATE HEALTH INSURANCE | Admitting: Occupational Therapy

## 2018-03-30 DIAGNOSIS — R29898 Other symptoms and signs involving the musculoskeletal system: Secondary | ICD-10-CM | POA: Diagnosis not present

## 2018-03-30 DIAGNOSIS — M25612 Stiffness of left shoulder, not elsewhere classified: Secondary | ICD-10-CM

## 2018-03-30 DIAGNOSIS — M25512 Pain in left shoulder: Secondary | ICD-10-CM | POA: Diagnosis not present

## 2018-03-30 NOTE — Therapy (Addendum)
[place], [place], [address] Phone: [phone]   Fax:  [phone]  Occupational Therapy Treatment  Patient Details  Name: [name] MRN: [id number] Date of Birth: [birth year] Referring Provider: Wylene Simmer, MD   Encounter Date: 03/30/2018  OT End of Session - 03/30/18 4818    Visit Number  20    Number of Visits  24    Date for OT Re-Evaluation  04/05/18    Authorization Type  Workers compensation approved 16 visits for her shoulder. On 03/11/18, WC approved an additional 8 visits for her shoulder     Authorization - Visit Number  20    Authorization - Number of Visits  24    OT Start Time  1601    OT Stop Time  1648    OT Time Calculation (min)  47 min    Activity Tolerance  Patient tolerated treatment well    Behavior During Therapy  WFL for tasks assessed/performed       Past Medical History:  Diagnosis Date  . Arthritis   . Asthma   . Chest pain    cath 2005 norm cors, repeat cath Feb 2011 normal cors and only minimally  elevated pulmonary pressures  . Crohn disease ([place])   . Diverticulosis   . GERD (gastroesophageal reflux disease)   . Hiatal hernia   . Hypertension   . Obesity   . Palpitations   . Sleep apnea sleep study 11/03/2009    cpap; does sometimes, doesnt use every night.    Past Surgical History:  Procedure Laterality Date  . ABDOMINAL HYSTERECTOMY    . ABDOMINAL HYSTERECTOMY    . ANKLE ARTHROSCOPY  06/01/2012   Procedure: ANKLE ARTHROSCOPY;  Surgeon: Colin Rhein, MD;  Location: [facility];  Service: Orthopedics;  Laterality: Left;  left ankle arthorsocpy with extensive debridement and gastroc slide  . CARDIAC CATHETERIZATION  11/22/2009 and 2005   WNL  . CESAREAN SECTION    . CHOLECYSTECTOMY  09/08/2006   lap. chole.  . CHONDROPLASTY  06/17/2012   Procedure: CHONDROPLASTY;  Surgeon: Carole Civil, MD;  [facility];  Service: Orthopedics;   Laterality: Right;  right patella  . ESOPHAGOGASTRODUODENOSCOPY N/A 05/14/2015   Procedure: ESOPHAGOGASTRODUODENOSCOPY (EGD);  Surgeon: Rogene Houston, MD;  [facility];  Service: Endoscopy;  Laterality: N/A;  730  . INGUINAL HERNIA REPAIR  10/30/2008   right  . KNEE ARTHROSCOPY WITH LATERAL MENISECTOMY Left 12/23/2016   Procedure: LEFT KNEE ARTHROSCOPY WITH LATERAL MENISECTOMY;  Surgeon: Carole Civil, MD;  [facility];  Service: Orthopedics;  Laterality: Left;  . KNEE ARTHROSCOPY WITH MEDIAL MENISECTOMY Left 12/23/2016   Procedure: LEFT KNEE ARTHROSCOPY WITH MEDIAL MENISECTOMY CHONDROPLASTY PATELLA  AND MEDIAL FEMORAL CONDYLE LEFT KNEE;  Surgeon: Carole Civil, MD;  [facility];  Service: Orthopedics;  Laterality: Left;  . SHOULDER ARTHROSCOPY WITH SUBACROMIAL DECOMPRESSION AND BICEP TENDON REPAIR Left 12/21/2017   Procedure: Left shoulder arthroscopic biceps tenodesis, SAD, DCR and labrum debridement;  Surgeon: Nicholes Stairs, MD;  Location: [facility];  Service: Orthopedics;  Laterality: Left;  120 mins  . SHOULDER SURGERY     right x 2     There were no vitals filed for this visit.  Subjective Assessment - 03/30/18 1600    Subjective   S: It's a little tough doing this 2 days in a row.     Currently in Pain?  Yes    Pain Score  3     Pain Location  Shoulder    Pain Orientation  Left    Pain Descriptors / Indicators  Aching;Sore    Pain Type  Acute pain    Pain Radiating Towards  none    Pain Onset  More than a month ago    Pain Frequency  Constant    Aggravating Factors   weight bearing    Pain Relieving Factors  rest, pain medication    Effect of Pain on Daily Activities  min effect on ADLs    Multiple Pain Sites  No         [facility] OT Assessment - 03/30/18 1600      Assessment   Medical Diagnosis  Left shoulder SLAP, debridement, and bicep tenodesis      Precautions   Precautions  Shoulder    Type of Shoulder Precautions  Follow Standard  SLAP repair protocol. No lifting over 5#               OT Treatments/Exercises (OP) - 03/30/18 1604      Exercises   Exercises  Shoulder      Shoulder Exercises: Seated   Extension  Theraband;12 reps    Theraband Level (Shoulder Extension)  Level 3 (Green)    Retraction  Theraband;12 reps    Theraband Level (Shoulder Retraction)  Level 3 (Green)    Row  [employer] reps    Theraband Level (Shoulder Row)  Level 3 (Green)    Protraction  Theraband;10 reps    Theraband Level (Shoulder Protraction)  Level 2 (Red)    Horizontal ABduction  Theraband;10 reps    Theraband Level (Shoulder Horizontal ABduction)  Level 2 (Red)    External Rotation  Theraband;10 reps    Theraband Level (Shoulder External Rotation)  Level 2 (Red)    Flexion  Theraband;10 reps    Theraband Level (Shoulder Flexion)  Level 2 (Red)    Abduction  Theraband;10 reps    Theraband Level (Shoulder ABduction)  Level 2 (Red)      Shoulder Exercises: Sidelying   External Rotation  AROM;15 reps    Internal Rotation  AROM;15 reps    Flexion  AROM;15 reps    ABduction  AROM;15 reps    Other Sidelying Exercises  protraction 15 reps AROM    Other Sidelying Exercises  horizontal abduction, A/ROM, 15X      Shoulder Exercises: ROM/Strengthening   UBE (Upper Arm Bike)  level 1 2' forward 2' reverse at 3.5 speed    X to V Arms  15X    Ball on Wall  1' flexion      Manual Therapy   Manual Therapy  Myofascial release    Manual therapy comments  completed seperately from all other skilled interventions    Myofascial Release  myofascial release to left shoulder, trapezius, scapular, and upper arm region to decrease pain and improve pain free mobility.           OT Short Term Goals - 04/05/18 1628            OT SHORT TERM GOAL #1   Title  Patient will be educated and independent with HEP for left shoulder mobility for improved abiilty to return to normal work and daily tasks.     Time  6    Period  Weeks     Status  Achieved        OT SHORT TERM GOAL #2  Title  patient will improve Left shoulder P/ROM to WNL to increase ability to complete dressing tasks with less difficulty.     Time  6    Period  Weeks    Status  On-going        OT SHORT TERM GOAL #3   Title  Patient will increase left shoulder strength to 3+/5 in order to complete lightweight household actiivities with LUE.     Time  6    Period  Weeks    Status  Partially Met        OT SHORT TERM GOAL #4   Title  Patient will decrease pain level in left shoulder to 5/10 with daily task completion.     Time  6    Period  Weeks    Status  Achieved        OT SHORT TERM GOAL #5   Title  Patient will decrease fascial restrictions in LUE to mod amount in order to increase functional mobility needed to complete overhead and reaching tasks.     Time  6    Period  Weeks    Status  Achieved       OT Long Term Goals - 04/05/18 1629            OT LONG TERM GOAL #1   Title  Patient will return to highest level of independence with her LUE in order to return to work and complete required tasks.     Time  12    Period  Weeks    Status  On-going        OT LONG TERM GOAL #2   Title  patient will increase A/ROM of LUE to WNL to increase ability to complete over head reaching tasks without difficulty.     Time  12    Period  Weeks    Status  On-going        OT LONG TERM GOAL #3   Title  Patient will increase LUE strength to 4+/5 to increase ability to complete chest compressions during CPR when needed.     Time  12    Period  Weeks    Status  On-going        OT LONG TERM GOAL #4   Title  Patient will decrease fascial restrictions to min amount or less in order to increase functional mobility needed for overhead and reaching tasks.     Time  12    Period  Weeks    Status  On-going        OT LONG TERM GOAL #5   Title  Patient will report a decreased level of pain of  approximately 2/10 when completing work or daily tasks.     Time  12    Period  Weeks    Status  On-going                      Plan - 03/30/18 1642    Clinical Impression Statement  A: Continued with manual therapy working on fascial restrictions in upper trapezius region. Pt completed sidelying exercises, seated theraband working on scapular stability and shoulder strengthening. Pt achieving 50% range with theraband flexion and abduction with good form. Resumed x to v arms, ball on wall, and UBE. Verbal cuing for form and technique.     Plan  P: Continue with theraband exercises, attempt ball on wall in abduction. Add overhead lacing  Patient will benefit from skilled therapeutic intervention in order to improve the following deficits and impairments:  Pain, Decreased range of motion, Impaired UE functional use, Increased fascial restrictions, Decreased strength, Impaired flexibility  Visit Diagnosis: Other symptoms and signs involving the musculoskeletal system  Acute pain of left shoulder  Stiffness of left shoulder, not elsewhere classified    Problem List Patient Active Problem List   Diagnosis Date Noted  . Derangement of anterior horn of lateral meniscus of left knee   . Derangement of posterior horn of medial meniscus of left knee   . Chest pain 04/25/2016  . Hypokalemia 04/25/2016  . Hyperglycemia 04/25/2016  . Diverticulitis of colon 07/17/2014  . Nausea without vomiting 07/17/2014  . Crohn disease ([place]) 07/17/2014  . Diverticulitis of colon without hemorrhage 08/10/2013  . History of arthroscopy of right knee 07/25/2012  . Difficulty in walking(719.7) 06/29/2012  . Weakness of right leg 06/29/2012  . Posterior tibial tendonitis 11/11/2011  . PTTD (posterior tibial tendon dysfunction) 11/11/2011  . Knee pain, right 11/11/2011  . Chest pain 07/16/2011  . Primary osteoarthritis of left knee 02/25/2010  . PAIN IN JOINT, MULTIPLE SITES  02/25/2010  . Essential hypertension 08/13/2009  . HIATAL HERNIA 08/13/2009  . PALPITATIONS 08/13/2009  . DYSPNEA 08/13/2009   Guadelupe Sabin, OTR/L  [phone] 03/30/2018, 4:49 PM  [facility] [address] [place], [place], [address] Phone: [phone]   Fax:  [phone]  Name: [name] MRN: [id number] Date of Birth: June 07, 1966

## 2018-04-05 ENCOUNTER — Other Ambulatory Visit: Payer: Self-pay

## 2018-04-05 ENCOUNTER — Encounter (HOSPITAL_COMMUNITY): Payer: Self-pay

## 2018-04-05 ENCOUNTER — Ambulatory Visit (HOSPITAL_COMMUNITY): Payer: PRIVATE HEALTH INSURANCE | Attending: Family Medicine

## 2018-04-05 DIAGNOSIS — R29898 Other symptoms and signs involving the musculoskeletal system: Secondary | ICD-10-CM | POA: Insufficient documentation

## 2018-04-05 DIAGNOSIS — M25512 Pain in left shoulder: Secondary | ICD-10-CM | POA: Insufficient documentation

## 2018-04-05 DIAGNOSIS — M25612 Stiffness of left shoulder, not elsewhere classified: Secondary | ICD-10-CM | POA: Insufficient documentation

## 2018-04-05 NOTE — Addendum Note (Signed)
Addended by: Ailene Ravel D on: 04/05/2018 05:25 PM   Modules accepted: Orders

## 2018-04-05 NOTE — Therapy (Addendum)
[place], [place], [address] Phone: [phone]   Fax:  [phone]  Occupational Therapy Treatment  Patient Details  Name: [name] MRN: [id number] Date of Birth: 13-Feb-1966 Referring Provider: Wylene Simmer, MD   Encounter Date: 04/05/2018   04/05/18 1341  OT Visits / Re-Eval  Visit Number 21  Number of Visits 24  Date for OT Re-Evaluation 05/03/18  Authorization  Authorization Type Workers compensation approved 16 visits for her shoulder. On 03/11/18, WC approved an additional 8 visits for her shoulder   Authorization - Visit Number 21  Authorization - Number of Visits 24  OT Time Calculation  OT Start Time 7096 (Patient was on phone delaying start of session)  OT Stop Time 1345  OT Time Calculation (min) 33 min  End of Session  Activity Tolerance Patient tolerated treatment well  Behavior During Therapy [facility] for tasks assessed/performed      04/05/18 1314  Symptoms/Limitations  Subjective  S: Today is one of those days that is just more challenging than the others.  Pain Assessment  Currently in Pain? Yes  Pain Score 4  Pain Location Shoulder  Pain Orientation Left  Pain Descriptors / Indicators Aching;Pins and needles  Pain Type Acute pain  Pain Radiating Towards N?A  Pain Onset More than a month ago  Pain Frequency Constant  Aggravating Factors  weight bearing, use  Pain Relieving Factors rest, pain medication  Effect of Pain on Daily Activities minimal effect on ADLs  Multiple Pain Sites No     04/05/18 1310  Assessment  Medical Diagnosis Left shoulder SLAP, debridement, and bicep tenodesis  Onset Date/Surgical Date 07/31/17  Precautions  Precautions Shoulder  Type of Shoulder Precautions Follow Standard SLAP repair protocol. No lifting over 5#  Observation/Other Assessments  Focus on Therapeutic Outcomes (FOTO)  48/100 (previous: 51/100)  ROM / Strength  AROM / PROM / Strength  AROM;PROM;Strength  AROM  Left Shoulder Flexion 130 Degrees (previous: 120)  Left Shoulder ABduction 125 Degrees (previous: 122)  Left Shoulder External Rotation 60 Degrees (previous: 25)  Left Shoulder Internal Rotation 90 Degrees (same as previous)  AROM Assessment Site Shoulder  Overall AROM Comments Assessed seated. IR/er adducted  Right/Left Shoulder Left  PROM  Left Shoulder Flexion 130 Degrees (previous: 132)  Left Shoulder ABduction 180 Degrees (previous: 168)  Left Shoulder External Rotation 64 Degrees (previous: 34)  Left Shoulder Internal Rotation 90 Degrees (same as previous)  PROM Assessment Site Shoulder  Right/Left Shoulder Left  Overall PROM Comments Assessed supine. IR/er adducted  Strength  Left Shoulder Flexion 4-/5 (previous: 3/5)  Left Shoulder ABduction 3/5 (previous: 3/5)  Left Shoulder External Rotation 4+/5 (previous: 3/5)  Left Shoulder Internal Rotation 4+/5 (previous: 3/5)  Strength Assessment Site Shoulder  Right/Left Shoulder Left  Overall Strength Comments assessed seated. IR/er adducted     04/05/18 1310  Exercises  Exercises Shoulder  Shoulder Exercises: Supine  Protraction PROM;5 reps  Horizontal ABduction PROM;5 reps  External Rotation PROM;5 reps  Internal Rotation PROM;5 reps  Flexion PROM;5 reps  ABduction PROM;5 reps  Manual Therapy  Manual Therapy Myofascial release  Manual therapy comments completed seperately from all other skilled interventions  Myofascial Release myofascial release to left shoulder, trapezius, scapular, and upper arm region to decrease pain and improve pain free mobility.        04/05/18 1438  OT Assessment and Plan  Clinical Impression Statement A: Continued with manual therapy working on fascial restrictions  in upper trapezius region. Mini reassessment completed. Patient is showing slight improvement in P/ROM, A/ROM, and strength but still maintains deficits in all these areas. She reports she  doesn't use the left arm much other than when doing her exercises. Therapist encouraged her to use arm as much as possible in order to increase ROM and strength. Patient reports she is afraid to do any lifting with the left arm due to 5# lifting precaution.   Pt will benefit from skilled therapeutic intervention in order to improve on the following deficits  Pain;Decreased range of motion;Impaired UE functional use;Increased fascial restrictions;Decreased strength;Impaired flexibility  Plan P: Continue with myofascial release to address fascial restrictions in anterior and lateral upper arm. Continue with theraband exercises, attempt ball on wall in abduction. Add overhead lacing.  Consulted and Agree with Plan of Care Patient    Past Medical History:  Diagnosis Date  . Arthritis   . Asthma   . Chest pain    cath 2005 norm cors, repeat cath Feb 2011 normal cors and only minimally  elevated pulmonary pressures  . Crohn disease ([place])   . Diverticulosis   . GERD (gastroesophageal reflux disease)   . Hiatal hernia   . Hypertension   . Obesity   . Palpitations   . Sleep apnea sleep study 11/03/2009    cpap; does sometimes, doesnt use every night.    Past Surgical History:  Procedure Laterality Date  . ABDOMINAL HYSTERECTOMY    . ABDOMINAL HYSTERECTOMY    . ANKLE ARTHROSCOPY  06/01/2012   Procedure: ANKLE ARTHROSCOPY;  Surgeon: Colin Rhein, MD;  Location: [facility];  Service: Orthopedics;  Laterality: Left;  left ankle arthorsocpy with extensive debridement and gastroc slide  . CARDIAC CATHETERIZATION  11/22/2009 and 2005   WNL  . CESAREAN SECTION    . CHOLECYSTECTOMY  09/08/2006   lap. chole.  . CHONDROPLASTY  06/17/2012   Procedure: CHONDROPLASTY;  Surgeon: Carole Civil, MD;  [facility];  Service: Orthopedics;  Laterality: Right;  right patella  . ESOPHAGOGASTRODUODENOSCOPY N/A 05/14/2015   Procedure: ESOPHAGOGASTRODUODENOSCOPY (EGD);  Surgeon: Rogene Houston, MD;  [facility];  Service: Endoscopy;  Laterality: N/A;  730  . INGUINAL HERNIA REPAIR  10/30/2008   right  . KNEE ARTHROSCOPY WITH LATERAL MENISECTOMY Left 12/23/2016   Procedure: LEFT KNEE ARTHROSCOPY WITH LATERAL MENISECTOMY;  Surgeon: Carole Civil, MD;  [facility];  Service: Orthopedics;  Laterality: Left;  . KNEE ARTHROSCOPY WITH MEDIAL MENISECTOMY Left 12/23/2016   Procedure: LEFT KNEE ARTHROSCOPY WITH MEDIAL MENISECTOMY CHONDROPLASTY PATELLA  AND MEDIAL FEMORAL CONDYLE LEFT KNEE;  Surgeon: Carole Civil, MD;  [facility];  Service: Orthopedics;  Laterality: Left;  . SHOULDER ARTHROSCOPY WITH SUBACROMIAL DECOMPRESSION AND BICEP TENDON REPAIR Left 12/21/2017   Procedure: Left shoulder arthroscopic biceps tenodesis, SAD, DCR and labrum debridement;  Surgeon: Nicholes Stairs, MD;  Location: [facility];  Service: Orthopedics;  Laterality: Left;  120 mins  . SHOULDER SURGERY     right x 2     There were no vitals filed for this visit.      OT Short Term Goals - 04/06/18 1458      OT SHORT TERM GOAL #1   Title  Patient will be educated and independent with HEP for left shoulder mobility for improved abiilty to return to normal work and daily tasks.     Time  6    Period  Weeks  Status  Achieved      OT SHORT TERM GOAL #2   Title  patient will improve Left shoulder P/ROM to WNL to increase ability to complete dressing tasks with less difficulty.     Time  6    Period  Weeks    Status  On-going      OT SHORT TERM GOAL #3   Title  Patient will increase left shoulder strength to 3+/5 in order to complete lightweight household actiivities with LUE.     Time  6    Status  Partially Met      OT SHORT TERM GOAL #4   Title  Patient will decrease pain level in left shoulder to 5/10 with daily task completion.     Time  6    Period  Weeks    Status  Achieved      OT SHORT TERM GOAL #5   Title  Patient will decrease fascial restrictions in  LUE to mod amount in order to increase functional mobility needed to complete overhead and reaching tasks.     Time  6    Period  Weeks    Status  Achieved        OT Long Term Goals - 04/06/18 1500      OT LONG TERM GOAL #1   Title  Patient will return to highest level of independence with her LUE in order to return to work and complete required tasks.     Time  12    Period  Weeks    Status  On-going      OT LONG TERM GOAL #2   Title  patient will increase A/ROM of LUE to WNL to increase ability to complete over head reaching tasks without difficulty.     Time  12    Period  Weeks    Status  On-going      OT LONG TERM GOAL #3   Title  Patient will increase LUE strength to 4+/5 to increase ability to complete chest compressions during CPR when needed.     Time  12    Period  Weeks    Status  On-going      OT LONG TERM GOAL #4   Title  Patient will decrease fascial restrictions to min amount or less in order to increase functional mobility needed for overhead and reaching tasks.     Time  12    Period  Weeks    Status  On-going      OT LONG TERM GOAL #5   Title  Patient will report a decreased level of pain of approximately 2/10 when completing work or daily tasks.     Time  12    Period  Weeks    Status  On-going              Patient will benefit from skilled therapeutic intervention in order to improve the following deficits and impairments:  Pain, Decreased range of motion, Impaired UE functional use, Increased fascial restrictions, Decreased strength, Impaired flexibility  Visit Diagnosis: Other symptoms and signs involving the musculoskeletal system - Plan: Ot plan of care cert/re-cert  Acute pain of left shoulder - Plan: Ot plan of care cert/re-cert  Stiffness of left shoulder, not elsewhere classified - Plan: Ot plan of care cert/re-cert    Problem List Patient Active Problem List   Diagnosis Date Noted  . Derangement of anterior horn of lateral  meniscus of left knee   .  Derangement of posterior horn of medial meniscus of left knee   . Chest pain 04/25/2016  . Hypokalemia 04/25/2016  . Hyperglycemia 04/25/2016  . Diverticulitis of colon 07/17/2014  . Nausea without vomiting 07/17/2014  . Crohn disease ([place]) 07/17/2014  . Diverticulitis of colon without hemorrhage 08/10/2013  . History of arthroscopy of right knee 07/25/2012  . Difficulty in walking(719.7) 06/29/2012  . Weakness of right leg 06/29/2012  . Posterior tibial tendonitis 11/11/2011  . PTTD (posterior tibial tendon dysfunction) 11/11/2011  . Knee pain, right 11/11/2011  . Chest pain 07/16/2011  . Primary osteoarthritis of left knee 02/25/2010  . PAIN IN JOINT, MULTIPLE SITES 02/25/2010  . Essential hypertension 08/13/2009  . HIATAL HERNIA 08/13/2009  . PALPITATIONS 08/13/2009  . DYSPNEA 08/13/2009    Roderic Palau, OT student 04/13/2018, 8:39 AM  [place], [place], [address] Phone: [phone]   Fax:  [phone]  Name: EVIANA SIBILIA MRN: [id number] Date of Birth: 1965/11/14

## 2018-04-08 ENCOUNTER — Encounter (HOSPITAL_COMMUNITY): Payer: Self-pay | Admitting: Occupational Therapy

## 2018-04-12 ENCOUNTER — Other Ambulatory Visit: Payer: Self-pay | Admitting: Orthopedic Surgery

## 2018-04-12 ENCOUNTER — Ambulatory Visit (HOSPITAL_COMMUNITY): Payer: PRIVATE HEALTH INSURANCE

## 2018-04-13 ENCOUNTER — Ambulatory Visit (HOSPITAL_COMMUNITY): Payer: PRIVATE HEALTH INSURANCE

## 2018-04-13 ENCOUNTER — Encounter (HOSPITAL_COMMUNITY): Payer: Self-pay

## 2018-04-13 ENCOUNTER — Other Ambulatory Visit: Payer: Self-pay | Admitting: Orthopedic Surgery

## 2018-04-13 ENCOUNTER — Other Ambulatory Visit: Payer: Self-pay

## 2018-04-13 DIAGNOSIS — R29898 Other symptoms and signs involving the musculoskeletal system: Secondary | ICD-10-CM

## 2018-04-13 DIAGNOSIS — M25512 Pain in left shoulder: Secondary | ICD-10-CM

## 2018-04-13 DIAGNOSIS — M25612 Stiffness of left shoulder, not elsewhere classified: Secondary | ICD-10-CM

## 2018-04-13 MED ORDER — IBUPROFEN 800 MG PO TABS: 800.0000 mg | ORAL_TABLET | Freq: Three times a day (TID) | ORAL | 1 refills | Status: DC | PRN

## 2018-04-13 MED FILL — IBUPROFEN 800 MG TAB: 800 | 30 days supply | Qty: 90 | Fill #0

## 2018-04-13 NOTE — Therapy (Signed)
[place], [place], [address] Phone: [phone]   Fax:  [phone]  Occupational Therapy Treatment  Patient Details  Name: [name] MRN: [id number] Date of Birth: 07-Jun-1966 Referring Provider: Wylene Simmer, MD   Encounter Date: 04/13/2018  OT End of Session - 04/13/18 1123    Visit Number  22    Number of Visits  24    Date for OT Re-Evaluation  05/03/18    Authorization Type  Workers compensation approved 16 visits for her shoulder. On 03/11/18, WC approved an additional 8 visits for her shoulder     Authorization - Visit Number  22    Authorization - Number of Visits  24    OT Start Time  7616 patient arrived late    OT Stop Time  1200    OT Time Calculation (min)  37 min    Activity Tolerance  Patient tolerated treatment well    Behavior During Therapy  WFL for tasks assessed/performed       Past Medical History:  Diagnosis Date  . Arthritis   . Asthma   . Chest pain    cath 2005 norm cors, repeat cath Feb 2011 normal cors and only minimally  elevated pulmonary pressures  . Crohn disease ([place])   . Diverticulosis   . GERD (gastroesophageal reflux disease)   . Hiatal hernia   . Hypertension   . Obesity   . Palpitations   . Sleep apnea sleep study 11/03/2009    cpap; does sometimes, doesnt use every night.    Past Surgical History:  Procedure Laterality Date  . ABDOMINAL HYSTERECTOMY    . ABDOMINAL HYSTERECTOMY    . ANKLE ARTHROSCOPY  06/01/2012   Procedure: ANKLE ARTHROSCOPY;  Surgeon: Colin Rhein, MD;  Location: [facility];  Service: Orthopedics;  Laterality: Left;  left ankle arthorsocpy with extensive debridement and gastroc slide  . CARDIAC CATHETERIZATION  11/22/2009 and 2005   WNL  . CESAREAN SECTION    . CHOLECYSTECTOMY  09/08/2006   lap. chole.  . CHONDROPLASTY  06/17/2012   Procedure: CHONDROPLASTY;  Surgeon: Carole Civil, MD;  [facility];  Service:  Orthopedics;  Laterality: Right;  right patella  . ESOPHAGOGASTRODUODENOSCOPY N/A 05/14/2015   Procedure: ESOPHAGOGASTRODUODENOSCOPY (EGD);  Surgeon: Rogene Houston, MD;  [facility];  Service: Endoscopy;  Laterality: N/A;  730  . INGUINAL HERNIA REPAIR  10/30/2008   right  . KNEE ARTHROSCOPY WITH LATERAL MENISECTOMY Left 12/23/2016   Procedure: LEFT KNEE ARTHROSCOPY WITH LATERAL MENISECTOMY;  Surgeon: Carole Civil, MD;  [facility];  Service: Orthopedics;  Laterality: Left;  . KNEE ARTHROSCOPY WITH MEDIAL MENISECTOMY Left 12/23/2016   Procedure: LEFT KNEE ARTHROSCOPY WITH MEDIAL MENISECTOMY CHONDROPLASTY PATELLA  AND MEDIAL FEMORAL CONDYLE LEFT KNEE;  Surgeon: Carole Civil, MD;  [facility];  Service: Orthopedics;  Laterality: Left;  . SHOULDER ARTHROSCOPY WITH SUBACROMIAL DECOMPRESSION AND BICEP TENDON REPAIR Left 12/21/2017   Procedure: Left shoulder arthroscopic biceps tenodesis, SAD, DCR and labrum debridement;  Surgeon: Nicholes Stairs, MD;  Location: [facility];  Service: Orthopedics;  Laterality: Left;  120 mins  . SHOULDER SURGERY     right x 2     There were no vitals filed for this visit.  Subjective Assessment - 04/13/18 1123    Subjective   S: I think it is just a little sore from doing the exercises.    Currently  in Pain?  Yes    Pain Score  4     Pain Location  Shoulder    Pain Orientation  Left    Pain Descriptors / Indicators  Aching;Sore    Pain Type  Acute pain    Pain Radiating Towards  N/A    Pain Onset  More than a month ago    Pain Frequency  Constant    Aggravating Factors   weight bearing, use    Pain Relieving Factors  rest, pain medication    Effect of Pain on Daily Activities  minimal effect on ADLs    Multiple Pain Sites  No         [facility] OT Assessment - 04/13/18 1122      Assessment   Medical Diagnosis  Left shoulder SLAP, debridement, and bicep tenodesis      Precautions   Precautions  Shoulder    Type of Shoulder  Precautions  Follow Standard SLAP repair protocol. No lifting over 5#               OT Treatments/Exercises (OP) - 04/13/18 1122      Exercises   Exercises  Shoulder      Shoulder Exercises: Supine   Protraction  PROM;5 reps    Horizontal ABduction  PROM;5 reps    External Rotation  PROM;5 reps    Internal Rotation  PROM;5 reps    Flexion  PROM;5 reps    ABduction  PROM;5 reps      Shoulder Exercises: Seated   Flexion  Theraband;15 reps    Theraband Level (Shoulder Flexion)  Level 2 (Red)    Abduction  Theraband;10 reps    Theraband Level (Shoulder ABduction)  Level 2 (Red)      Shoulder Exercises: Sidelying   External Rotation  Strengthening;10 reps    External Rotation Weight (lbs)  1    Internal Rotation  Strengthening;10 reps    Internal Rotation Weight (lbs)  1    Flexion  Strengthening;10 reps    Flexion Weight (lbs)  1    ABduction  Strengthening;10 reps    ABduction Weight (lbs)  1    Other Sidelying Exercises  protraction; strengthening; 10X; 1#    Other Sidelying Exercises  horizontal abduction; strengthening; 10X; 1#      Shoulder Exercises: ROM/Strengthening   Over Head Lace  1' 30"     Ball on Wall  1' flexion and 1' abduction      Manual Therapy   Manual Therapy  Myofascial release    Manual therapy comments  completed seperately from all other skilled interventions    Myofascial Release  myofascial release to left shoulder, trapezius, scapular, and upper arm region to decrease pain and improve pain free mobility.                OT Short Term Goals - 04/13/18 1146      OT SHORT TERM GOAL #1   Title  Patient will be educated and independent with HEP for left shoulder mobility for improved abiilty to return to normal work and daily tasks.     Time  6    Period  Weeks      OT SHORT TERM GOAL #2   Title  patient will improve Left shoulder P/ROM to WNL to increase ability to complete dressing tasks with less difficulty.     Time  6     Period  Weeks    Status  On-going  OT SHORT TERM GOAL #3   Title  Patient will increase left shoulder strength to 3+/5 in order to complete lightweight household actiivities with LUE.     Time  6    Status  Partially Met      OT SHORT TERM GOAL #4   Title  Patient will decrease pain level in left shoulder to 5/10 with daily task completion.     Time  6    Period  Weeks      OT SHORT TERM GOAL #5   Title  Patient will decrease fascial restrictions in LUE to mod amount in order to increase functional mobility needed to complete overhead and reaching tasks.     Time  6    Period  Weeks        OT Long Term Goals - 04/06/18 1500      OT LONG TERM GOAL #1   Title  Patient will return to highest level of independence with her LUE in order to return to work and complete required tasks.     Time  12    Period  Weeks    Status  On-going      OT LONG TERM GOAL #2   Title  patient will increase A/ROM of LUE to WNL to increase ability to complete over head reaching tasks without difficulty.     Time  12    Period  Weeks    Status  On-going      OT LONG TERM GOAL #3   Title  Patient will increase LUE strength to 4+/5 to increase ability to complete chest compressions during CPR when needed.     Time  12    Period  Weeks    Status  On-going      OT LONG TERM GOAL #4   Title  Patient will decrease fascial restrictions to min amount or less in order to increase functional mobility needed for overhead and reaching tasks.     Time  12    Period  Weeks    Status  On-going      OT LONG TERM GOAL #5   Title  Patient will report a decreased level of pain of approximately 2/10 when completing work or daily tasks.     Time  12    Period  Weeks    Status  On-going            Plan - 04/13/18 1147    Clinical Impression Statement  A: Minimal fascial restrictions palpated in upper trapezius and anterior upper arm. Patient reporting inconsistent compliance with HEP. Patient  progressed to strengthening in sidelying. Overhead lacing and ball on the wall added. Patient requiring extra time due to pain during exercises. Mininmal VC for form and technique.    Plan  P: Continue with myofascial release to address fascial restrictions in anterior and lateral upper arm. Continue with theraband exercises and add light weight to overhead lacing.    Consulted and Agree with Plan of Care  Patient       Patient will benefit from skilled therapeutic intervention in order to improve the following deficits and impairments:  Pain, Decreased range of motion, Impaired UE functional use, Increased fascial restrictions, Decreased strength, Impaired flexibility  Visit Diagnosis: Other symptoms and signs involving the musculoskeletal system  Acute pain of left shoulder  Stiffness of left shoulder, not elsewhere classified    Problem List Patient Active Problem List   Diagnosis Date Noted  .  Derangement of anterior horn of lateral meniscus of left knee   . Derangement of posterior horn of medial meniscus of left knee   . Chest pain 04/25/2016  . Hypokalemia 04/25/2016  . Hyperglycemia 04/25/2016  . Diverticulitis of colon 07/17/2014  . Nausea without vomiting 07/17/2014  . Crohn disease ([place]) 07/17/2014  . Diverticulitis of colon without hemorrhage 08/10/2013  . History of arthroscopy of right knee 07/25/2012  . Difficulty in walking(719.7) 06/29/2012  . Weakness of right leg 06/29/2012  . Posterior tibial tendonitis 11/11/2011  . PTTD (posterior tibial tendon dysfunction) 11/11/2011  . Knee pain, right 11/11/2011  . Chest pain 07/16/2011  . Primary osteoarthritis of left knee 02/25/2010  . PAIN IN JOINT, MULTIPLE SITES 02/25/2010  . Essential hypertension 08/13/2009  . HIATAL HERNIA 08/13/2009  . PALPITATIONS 08/13/2009  . DYSPNEA 08/13/2009    Roderic Palau, OT student 04/13/2018, 12:06 PM  [place], [place], [address] Phone: [phone]   Fax:  [phone]  Name: [name] MRN: [id number] Date of Birth: 05-Mar-1966

## 2018-04-14 ENCOUNTER — Other Ambulatory Visit: Payer: Self-pay

## 2018-04-14 ENCOUNTER — Encounter (HOSPITAL_COMMUNITY): Payer: Self-pay | Admitting: Physical Therapy

## 2018-04-14 ENCOUNTER — Ambulatory Visit (HOSPITAL_COMMUNITY): Payer: PRIVATE HEALTH INSURANCE

## 2018-04-14 ENCOUNTER — Encounter (HOSPITAL_COMMUNITY): Payer: Self-pay

## 2018-04-14 DIAGNOSIS — R29898 Other symptoms and signs involving the musculoskeletal system: Secondary | ICD-10-CM | POA: Diagnosis not present

## 2018-04-14 DIAGNOSIS — M25612 Stiffness of left shoulder, not elsewhere classified: Secondary | ICD-10-CM

## 2018-04-14 DIAGNOSIS — M25512 Pain in left shoulder: Secondary | ICD-10-CM

## 2018-04-14 NOTE — Therapy (Signed)
[place], [place], [address] Phone: [phone]   Fax:  [phone]  Patient Details  Name: [name] MRN: [id number] Date of Birth: April 26, 1966 Referring Provider:  No ref. provider found  Encounter Date: 04/14/2018  PHYSICAL THERAPY DISCHARGE SUMMARY  Visits from Start of Care: 7  Current functional level related to goals / functional outcomes: From last therapy session on 03/18/18: "Pt limited by pain with increased to 10/10 with weight bearing.  Noted inflammation on medial aspect of foot today vs lateral aspect.  Began session with manual retrograde massage for pain control and to improve ankle mobility.  Pt continues to have tenderness with palpation over posterior and anterior tib as well as peronal tendons distal to muscle bellies.  Therex focus on ankle mobilty and posterior tib strengthening.  Pt reports MD apt next week, reviewed goals prior apt.  Pt has improved ankle mobility and improved strengthening in inversion and eversion.  Unable to test gastroc strengthening due to pain.  Pt reports compliance with HEP daily and able to demonstrate and verbalize appropraite mechanics with all exercises.  Pt continues to be limited by pain especially with weight bearing, unable to assess 2MWT and stair mechanics this session."   See therapy note on 03/18/18 for more detail.    Remaining deficits: From last therapy session on 03/18/18: "Pt limited by pain with increased to 10/10 with weight bearing.  Noted inflammation on medial aspect of foot today vs lateral aspect.  Began session with manual retrograde massage for pain control and to improve ankle mobility.  Pt continues to have tenderness with palpation over posterior and anterior tib as well as peronal tendons distal to muscle bellies.  Therex focus on ankle mobilty and posterior tib strengthening.  Pt reports MD apt next week, reviewed goals prior apt.  Pt has improved  ankle mobility and improved strengthening in inversion and eversion.  Unable to test gastroc strengthening due to pain.  Pt reports compliance with HEP daily and able to demonstrate and verbalize appropraite mechanics with all exercises.  Pt continues to be limited by pain especially with weight bearing, unable to assess 2MWT and stair mechanics this session."   See therapy note on 03/18/18 for more detail.     Education / Equipment: Patient was provided education on HEP throughout see last therapy note on 03/18/18 for more detail.  Plan: Patient agrees to discharge.  Patient goals were partially met. Patient is being discharged due to the physician's request.  ?????  Patient reported that her physician stated that she would need surgery on her ankle and therefore should not continue physical therapy at this time.            Clarene Critchley PT, DPT 9:53 AM, 04/14/18 [place], [place], [address] Phone: [phone]   Fax:  [phone]

## 2018-04-14 NOTE — Therapy (Signed)
[place], [place], [address] Phone: [phone]   Fax:  [phone]  Occupational Therapy Treatment  Patient Details  Name: [name] MRN: [id number] Date of Birth: [birth year] Referring Provider: Wylene Simmer, MD   Encounter Date: 04/14/2018  OT End of Session - 04/14/18 0923    Visit Number  23    Number of Visits  24    Date for OT Re-Evaluation  05/03/18    Authorization Type  Workers compensation approved 16 visits for her shoulder. On 03/11/18, WC approved an additional 8 visits for her shoulder     Authorization - Visit Number  23    Authorization - Number of Visits  24    OT Start Time  0902    OT Stop Time  [id number]    OT Time Calculation (min)  40 min    Activity Tolerance  Patient tolerated treatment well    Behavior During Therapy  [facility] for tasks assessed/performed       Past Medical History:  Diagnosis Date  . Arthritis   . Asthma   . Chest pain    cath 2005 norm cors, repeat cath Feb 2011 normal cors and only minimally  elevated pulmonary pressures  . Crohn disease ([place])   . Diverticulosis   . GERD (gastroesophageal reflux disease)   . Hiatal hernia   . Hypertension   . Obesity   . Palpitations   . Sleep apnea sleep study 11/03/2009    cpap; does sometimes, doesnt use every night.    Past Surgical History:  Procedure Laterality Date  . ABDOMINAL HYSTERECTOMY    . ABDOMINAL HYSTERECTOMY    . ANKLE ARTHROSCOPY  06/01/2012   Procedure: ANKLE ARTHROSCOPY;  Surgeon: Colin Rhein, MD;  Location: [facility];  Service: Orthopedics;  Laterality: Left;  left ankle arthorsocpy with extensive debridement and gastroc slide  . CARDIAC CATHETERIZATION  11/22/2009 and 2005   WNL  . CESAREAN SECTION    . CHOLECYSTECTOMY  09/08/2006   lap. chole.  . CHONDROPLASTY  06/17/2012   Procedure: CHONDROPLASTY;  Surgeon: Carole Civil, MD;  [facility];  Service: Orthopedics;   Laterality: Right;  right patella  . ESOPHAGOGASTRODUODENOSCOPY N/A 05/14/2015   Procedure: ESOPHAGOGASTRODUODENOSCOPY (EGD);  Surgeon: Rogene Houston, MD;  [facility];  Service: Endoscopy;  Laterality: N/A;  730  . INGUINAL HERNIA REPAIR  10/30/2008   right  . KNEE ARTHROSCOPY WITH LATERAL MENISECTOMY Left 12/23/2016   Procedure: LEFT KNEE ARTHROSCOPY WITH LATERAL MENISECTOMY;  Surgeon: Carole Civil, MD;  [facility];  Service: Orthopedics;  Laterality: Left;  . KNEE ARTHROSCOPY WITH MEDIAL MENISECTOMY Left 12/23/2016   Procedure: LEFT KNEE ARTHROSCOPY WITH MEDIAL MENISECTOMY CHONDROPLASTY PATELLA  AND MEDIAL FEMORAL CONDYLE LEFT KNEE;  Surgeon: Carole Civil, MD;  [facility];  Service: Orthopedics;  Laterality: Left;  . SHOULDER ARTHROSCOPY WITH SUBACROMIAL DECOMPRESSION AND BICEP TENDON REPAIR Left 12/21/2017   Procedure: Left shoulder arthroscopic biceps tenodesis, SAD, DCR and labrum debridement;  Surgeon: Nicholes Stairs, MD;  Location: [facility];  Service: Orthopedics;  Laterality: Left;  120 mins  . SHOULDER SURGERY     right x 2     There were no vitals filed for this visit.  Subjective Assessment - 04/14/18 0902    Subjective   S: Mornings are always rough. It is alot more sore in the mornings.    Currently in Pain?  Yes    Pain Score  5     Pain Location  Shoulder    Pain Orientation  Left    Pain Descriptors / Indicators  Aching;Sore    Pain Type  Acute pain    Pain Radiating Towards  N/A    Pain Onset  More than a month ago    Pain Frequency  Constant    Aggravating Factors   weight bearing, use    Pain Relieving Factors  rest, pain medication    Effect of Pain on Daily Activities  minimal effect on ADLs    Multiple Pain Sites  No         [facility] OT Assessment - 04/14/18 0902      Assessment   Medical Diagnosis  Left shoulder SLAP, debridement, and bicep tenodesis      Precautions   Precautions  Shoulder    Type of Shoulder  Precautions  Follow Standard SLAP repair protocol. No lifting over 5#               OT Treatments/Exercises (OP) - 04/14/18 0902      Exercises   Exercises  Shoulder      Shoulder Exercises: Supine   Protraction  PROM;5 reps    Horizontal ABduction  PROM;5 reps    External Rotation  PROM;5 reps    Internal Rotation  PROM;5 reps    Flexion  PROM;5 reps    ABduction  PROM;5 reps      Shoulder Exercises: Seated   Horizontal ABduction  Theraband;15 reps    Theraband Level (Shoulder Horizontal ABduction)  Level 2 (Red)    External Rotation  Theraband;15 reps    Theraband Level (Shoulder External Rotation)  Level 2 (Red)    Internal Rotation  Theraband;15 reps    Theraband Level (Shoulder Internal Rotation)  Level 2 (Red)    Flexion  Theraband;15 reps    Theraband Level (Shoulder Flexion)  Level 2 (Red)    Abduction  Theraband;15 reps    Theraband Level (Shoulder ABduction)  Level 2 (Red)    Diagonals  --    Theraband Level (Shoulder Diagonals)  --      Shoulder Exercises: Sidelying   External Rotation  Strengthening;12 reps    External Rotation Weight (lbs)  1    Internal Rotation  Strengthening;12 reps    Internal Rotation Weight (lbs)  1    Flexion  Strengthening;12 reps    Flexion Weight (lbs)  1    ABduction  Strengthening;12 reps    ABduction Weight (lbs)  1    Other Sidelying Exercises  protraction; strengthening; 12X; 1#    Other Sidelying Exercises  horizontal abduction; strengthening; 12X; 1#      Shoulder Exercises: ROM/Strengthening   Over Head Lace  1" with 1/2 # wrist weight    Proximal Shoulder Strengthening, Seated  15X; 1# 1 rest break      Manual Therapy   Manual Therapy  Myofascial release    Manual therapy comments  completed seperately from all other skilled interventions    Myofascial Release  myofascial release to left shoulder, trapezius, scapular, and upper arm region to decrease pain and improve pain free mobility.               OT Education - 04/14/18 9741    Education provided  No       OT Short Term Goals - 04/13/18 1146      OT SHORT TERM GOAL #1  Title  Patient will be educated and independent with HEP for left shoulder mobility for improved abiilty to return to normal work and daily tasks.     Time  6    Period  Weeks      OT SHORT TERM GOAL #2   Title  patient will improve Left shoulder P/ROM to WNL to increase ability to complete dressing tasks with less difficulty.     Time  6    Period  Weeks    Status  On-going      OT SHORT TERM GOAL #3   Title  Patient will increase left shoulder strength to 3+/5 in order to complete lightweight household actiivities with LUE.     Time  6    Status  Partially Met      OT SHORT TERM GOAL #4   Title  Patient will decrease pain level in left shoulder to 5/10 with daily task completion.     Time  6    Period  Weeks      OT SHORT TERM GOAL #5   Title  Patient will decrease fascial restrictions in LUE to mod amount in order to increase functional mobility needed to complete overhead and reaching tasks.     Time  6    Period  Weeks        OT Long Term Goals - 04/06/18 1500      OT LONG TERM GOAL #1   Title  Patient will return to highest level of independence with her LUE in order to return to work and complete required tasks.     Time  12    Period  Weeks    Status  On-going      OT LONG TERM GOAL #2   Title  patient will increase A/ROM of LUE to WNL to increase ability to complete over head reaching tasks without difficulty.     Time  12    Period  Weeks    Status  On-going      OT LONG TERM GOAL #3   Title  Patient will increase LUE strength to 4+/5 to increase ability to complete chest compressions during CPR when needed.     Time  12    Period  Weeks    Status  On-going      OT LONG TERM GOAL #4   Title  Patient will decrease fascial restrictions to min amount or less in order to increase functional mobility needed for  overhead and reaching tasks.     Time  12    Period  Weeks    Status  On-going      OT LONG TERM GOAL #5   Title  Patient will report a decreased level of pain of approximately 2/10 when completing work or daily tasks.     Time  12    Period  Weeks    Status  On-going            Plan - 04/14/18 8938    Clinical Impression Statement  A: Minimal fascial restrictions palpated in upper trapezius and anterior upper arm. Patient reporting increased tightness due to not having worked the shoulder yet today. Patient able to increase reps for sidelying strengthening and for seated theraband exercises. Overhead lace with wrist weight added. Occasional rest breaks due to fatigue. Mininmal VC for form and technique.    Plan  P: Complete reassessment and assess potential for discharge. Follow up on  MD visit. Continue  with myofascial release to address fascial restrictions in anterior and lateral upper arm.     Consulted and Agree with Plan of Care  Patient       Patient will benefit from skilled therapeutic intervention in order to improve the following deficits and impairments:  Pain, Decreased range of motion, Impaired UE functional use, Increased fascial restrictions, Decreased strength, Impaired flexibility  Visit Diagnosis: Other symptoms and signs involving the musculoskeletal system  Acute pain of left shoulder  Stiffness of left shoulder, not elsewhere classified    Problem List Patient Active Problem List   Diagnosis Date Noted  . Derangement of anterior horn of lateral meniscus of left knee   . Derangement of posterior horn of medial meniscus of left knee   . Chest pain 04/25/2016  . Hypokalemia 04/25/2016  . Hyperglycemia 04/25/2016  . Diverticulitis of colon 07/17/2014  . Nausea without vomiting 07/17/2014  . Crohn disease ([place]) 07/17/2014  . Diverticulitis of colon without hemorrhage 08/10/2013  . History of arthroscopy of right knee 07/25/2012  . Difficulty in  walking(719.7) 06/29/2012  . Weakness of right leg 06/29/2012  . Posterior tibial tendonitis 11/11/2011  . PTTD (posterior tibial tendon dysfunction) 11/11/2011  . Knee pain, right 11/11/2011  . Chest pain 07/16/2011  . Primary osteoarthritis of left knee 02/25/2010  . PAIN IN JOINT, MULTIPLE SITES 02/25/2010  . Essential hypertension 08/13/2009  . HIATAL HERNIA 08/13/2009  . PALPITATIONS 08/13/2009  . DYSPNEA 08/13/2009    Juel Burrow student 04/14/2018, 9:42 AM  [place], [place], [address] Phone: [phone]   Fax:  [phone]  Name: [name] MRN: [id number] Date of Birth: 03-02-1966

## 2018-04-18 ENCOUNTER — Encounter (HOSPITAL_COMMUNITY): Payer: Self-pay | Admitting: Specialist

## 2018-04-18 ENCOUNTER — Ambulatory Visit (HOSPITAL_COMMUNITY): Payer: PRIVATE HEALTH INSURANCE | Admitting: Specialist

## 2018-04-18 DIAGNOSIS — R29898 Other symptoms and signs involving the musculoskeletal system: Secondary | ICD-10-CM | POA: Diagnosis not present

## 2018-04-18 DIAGNOSIS — M25612 Stiffness of left shoulder, not elsewhere classified: Secondary | ICD-10-CM

## 2018-04-18 DIAGNOSIS — M25512 Pain in left shoulder: Secondary | ICD-10-CM

## 2018-04-18 NOTE — Therapy (Signed)
[place], [place], [address] Phone: [phone]   Fax:  [phone]  Occupational Therapy Treatment  Patient Details  Name: [name] MRN: [id number] Date of Birth: 07-20-1966 Referring Provider: Wylene Simmer, MD   Encounter Date: 04/18/2018  OT End of Session - 04/18/18 1511    Visit Number  24    Number of Visits  32    Date for OT Re-Evaluation  05/03/18    Authorization Type  Workers compensation approved 16 visits for her shoulder. On 03/11/18, WC approved an additional 8 visits for her shoulder, On 7/15 requested 8 additional visits from [facility]    Authorization - Visit Number  24    Authorization - Number of Visits  24    OT Start Time  0950    OT Stop Time  1030    OT Time Calculation (min)  40 min    Activity Tolerance  Patient tolerated treatment well    Behavior During Therapy  WFL for tasks assessed/performed       Past Medical History:  Diagnosis Date  . Arthritis   . Asthma   . Chest pain    cath 2005 norm cors, repeat cath Feb 2011 normal cors and only minimally  elevated pulmonary pressures  . Crohn disease ([place])   . Diverticulosis   . GERD (gastroesophageal reflux disease)   . Hiatal hernia   . Hypertension   . Obesity   . Palpitations   . Sleep apnea sleep study 11/03/2009    cpap; does sometimes, doesnt use every night.    Past Surgical History:  Procedure Laterality Date  . ABDOMINAL HYSTERECTOMY    . ABDOMINAL HYSTERECTOMY    . ANKLE ARTHROSCOPY  06/01/2012   Procedure: ANKLE ARTHROSCOPY;  Surgeon: Colin Rhein, MD;  Location: [facility];  Service: Orthopedics;  Laterality: Left;  left ankle arthorsocpy with extensive debridement and gastroc slide  . CARDIAC CATHETERIZATION  11/22/2009 and 2005   WNL  . CESAREAN SECTION    . CHOLECYSTECTOMY  09/08/2006   lap. chole.  . CHONDROPLASTY  06/17/2012   Procedure: CHONDROPLASTY;  Surgeon: Carole Civil, MD;   [facility];  Service: Orthopedics;  Laterality: Right;  right patella  . ESOPHAGOGASTRODUODENOSCOPY N/A 05/14/2015   Procedure: ESOPHAGOGASTRODUODENOSCOPY (EGD);  Surgeon: Rogene Houston, MD;  [facility];  Service: Endoscopy;  Laterality: N/A;  730  . INGUINAL HERNIA REPAIR  10/30/2008   right  . KNEE ARTHROSCOPY WITH LATERAL MENISECTOMY Left 12/23/2016   Procedure: LEFT KNEE ARTHROSCOPY WITH LATERAL MENISECTOMY;  Surgeon: Carole Civil, MD;  [facility];  Service: Orthopedics;  Laterality: Left;  . KNEE ARTHROSCOPY WITH MEDIAL MENISECTOMY Left 12/23/2016   Procedure: LEFT KNEE ARTHROSCOPY WITH MEDIAL MENISECTOMY CHONDROPLASTY PATELLA  AND MEDIAL FEMORAL CONDYLE LEFT KNEE;  Surgeon: Carole Civil, MD;  [facility];  Service: Orthopedics;  Laterality: Left;  . SHOULDER ARTHROSCOPY WITH SUBACROMIAL DECOMPRESSION AND BICEP TENDON REPAIR Left 12/21/2017   Procedure: Left shoulder arthroscopic biceps tenodesis, SAD, DCR and labrum debridement;  Surgeon: Nicholes Stairs, MD;  Location: [facility];  Service: Orthopedics;  Laterality: Left;  120 mins  . SHOULDER SURGERY     right x 2     There were no vitals filed for this visit.  Subjective Assessment - 04/18/18 1510    Subjective   S:  The MD thinks I am doing good.  He thinks the  reason I am so tight in the front of my shoulder is scar tissue.     Currently in Pain?  Yes    Pain Score  4     Pain Location  Shoulder    Pain Orientation  Left    Pain Descriptors / Indicators  Aching;Sore    Pain Type  Acute pain    Pain Onset  More than a month ago    Pain Frequency  Intermittent    Aggravating Factors   use    Pain Relieving Factors  rest    Effect of Pain on Daily Activities  minimal         [facility] OT Assessment - 04/18/18 0001      Assessment   Medical Diagnosis  Left shoulder SLAP, debridement, and bicep tenodesis      Precautions   Precautions  Shoulder    Type of Shoulder Precautions  Follow  Standard SLAP repair protocol. No lifting over 5#      AROM   Overall AROM Comments  Assessed seated. IR/er adducted    Left Shoulder Flexion  145 Degrees 130    Left Shoulder ABduction  120 Degrees 125    Left Shoulder Internal Rotation  90 Degrees 90    Left Shoulder External Rotation  60 Degrees 60      Strength   Overall Strength Comments  assessed seated. IR/er adducted    Left Shoulder Flexion  4+/5 4-/5    Left Shoulder ABduction  4-/5 3/5    Left Shoulder Internal Rotation  4+/5 4+/5    Left Shoulder External Rotation  4+/5 4+/5               OT Treatments/Exercises (OP) - 04/18/18 0001      Exercises   Exercises  Shoulder      Shoulder Exercises: Supine   Protraction  PROM;5 reps;Strengthening;10 reps    Protraction Weight (lbs)  2    Horizontal ABduction  PROM;5 reps;Strengthening;10 reps    Horizontal ABduction Weight (lbs)  2    External Rotation  PROM;5 reps;Strengthening;10 reps    External Rotation Weight (lbs)  2    Internal Rotation  PROM;5 reps;Strengthening;10 reps    Internal Rotation Weight (lbs)  2    Flexion  PROM;5 reps;Strengthening;10 reps    Shoulder Flexion Weight (lbs)  2    ABduction  PROM;5 reps;Strengthening;10 reps    Shoulder ABduction Weight (lbs)  2      Shoulder Exercises: ROM/Strengthening   "W" Arms  10 with 2#    X to V Arms  10 2#    Proximal Shoulder Strengthening, Seated  10 times with 2# no rest breaks     Other ROM/Strengthening Exercises  wrote alphabet in air, holding arm at 90 flexion with 2# weight with moderate difficulty       Manual Therapy   Manual Therapy  Myofascial release    Manual therapy comments  completed seperately from all other skilled interventions    Myofascial Release  myofascial release to left shoulder, trapezius, scapular, and upper arm region to decrease pain and improve pain free mobility.                OT Short Term Goals - 04/18/18 1515      OT SHORT TERM GOAL #1   Title   Patient will be educated and independent with HEP for left shoulder mobility for improved abiilty to return to normal work and daily tasks.  Time  6    Period  Weeks    Status  Achieved      OT SHORT TERM GOAL #2   Title  patient will improve Left shoulder P/ROM to WNL to increase ability to complete dressing tasks with less difficulty.     Time  6    Period  Weeks    Status  Achieved      OT SHORT TERM GOAL #3   Title  Patient will increase left shoulder strength to 3+/5 in order to complete lightweight household actiivities with LUE.     Time  6    Status  Achieved      OT SHORT TERM GOAL #4   Title  Patient will decrease pain level in left shoulder to 5/10 with daily task completion.     Time  6    Period  Weeks    Status  Achieved      OT SHORT TERM GOAL #5   Title  Patient will decrease fascial restrictions in LUE to mod amount in order to increase functional mobility needed to complete overhead and reaching tasks.     Time  6    Period  Weeks    Status  Achieved        OT Long Term Goals - 04/18/18 1516      OT LONG TERM GOAL #1   Title  Patient will return to highest level of independence with her LUE in order to return to work and complete required tasks.     Time  12    Period  Weeks    Status  On-going      OT LONG TERM GOAL #2   Title  patient will increase A/ROM of LUE to WNL to increase ability to complete over head reaching tasks without difficulty.     Time  12    Period  Weeks    Status  On-going      OT LONG TERM GOAL #3   Title  Patient will increase LUE strength to 4+/5 to increase ability to complete chest compressions during CPR when needed.     Time  12    Period  Weeks    Status  Achieved      OT LONG TERM GOAL #4   Title  Patient will decrease fascial restrictions to min amount or less in order to increase functional mobility needed for overhead and reaching tasks.     Time  12    Period  Weeks    Status  On-going      OT LONG TERM  GOAL #5   Title  Patient will report a decreased level of pain of approximately 2/10 when completing work or daily tasks.     Time  12    Period  Weeks    Status  On-going      Long Term Additional Goals   Additional Long Term Goals  Yes      OT LONG TERM GOAL #6   Title  Patient will increase LUE strength to5/5 to increase ability to complete chest compressions during CPR when needed.     Time  4    Period  Weeks    Status  New    Target Date  05/18/18            Plan - 04/18/18 1512    Clinical Impression Statement  A:  Patient continues to have minimal fasical restrictions and tightness in upper trapezius  and anterior shoulder region.  Demonstrated good form with x to v and w arms.  focused on proximal shoulder stability needed for improved sustained overhead reach.  A/ROM has increased significantly in flexion.  LUE strength has improved to good + throughout ranges.  Patient would benefit from continued skilled OT intervention to imrpove end range mobility and improve strength to 5/5 in order to return to work.  Continued therapy indicated to decrasee pain level while comleting adls.      Plan  P:  Once approval from [facility] obtained, continue skilled OT intervention 2 times per week for 4 weeks with focus on improving end range mobilty and strength in lue needed for return to work tasks.     Consulted and Agree with Plan of Care  Patient       Patient will benefit from skilled therapeutic intervention in order to improve the following deficits and impairments:  Pain, Decreased range of motion, Impaired UE functional use, Increased fascial restrictions, Decreased strength, Impaired flexibility  Visit Diagnosis: Other symptoms and signs involving the musculoskeletal system  Acute pain of left shoulder  Stiffness of left shoulder, not elsewhere classified    Problem List Patient Active Problem List   Diagnosis Date Noted  . Derangement of anterior horn of lateral meniscus of  left knee   . Derangement of posterior horn of medial meniscus of left knee   . Chest pain 04/25/2016  . Hypokalemia 04/25/2016  . Hyperglycemia 04/25/2016  . Diverticulitis of colon 07/17/2014  . Nausea without vomiting 07/17/2014  . Crohn disease ([place]) 07/17/2014  . Diverticulitis of colon without hemorrhage 08/10/2013  . History of arthroscopy of right knee 07/25/2012  . Difficulty in walking(719.7) 06/29/2012  . Weakness of right leg 06/29/2012  . Posterior tibial tendonitis 11/11/2011  . PTTD (posterior tibial tendon dysfunction) 11/11/2011  . Knee pain, right 11/11/2011  . Chest pain 07/16/2011  . Primary osteoarthritis of left knee 02/25/2010  . PAIN IN JOINT, MULTIPLE SITES 02/25/2010  . Essential hypertension 08/13/2009  . HIATAL HERNIA 08/13/2009  . PALPITATIONS 08/13/2009  . DYSPNEA 08/13/2009    Vangie Bicker, [place], OTR/L [phone]  04/18/2018, 3:22 PM  [facility] [address] [place], [place], [address] Phone: [phone]   Fax:  [phone]  Name: MAKAYAH PAULI MRN: [id number] Date of Birth: Apr 04, 1966

## 2018-04-19 ENCOUNTER — Telehealth (HOSPITAL_COMMUNITY): Payer: Self-pay | Admitting: Specialist

## 2018-04-19 NOTE — Telephone Encounter (Signed)
Called pt to schedule- she was driving and will call us back to r/s. Pt understand She has been approved for 12 additional visit per PRO letter scanned in epic. NF 04/19/2018

## 2018-04-22 ENCOUNTER — Telehealth (HOSPITAL_COMMUNITY): Payer: Self-pay | Admitting: Specialist

## 2018-04-22 ENCOUNTER — Telehealth (HOSPITAL_COMMUNITY): Payer: Self-pay

## 2018-04-22 NOTE — Telephone Encounter (Signed)
Pt understands she  has been approved for 12 additional visit per PRO letter scanned in epic. Pt called back and scheduled 6 apptments before surgery on ankle scheduled for 05/11/18. NF 04/22/18

## 2018-04-22 NOTE — Telephone Encounter (Signed)
F/u call to schedule more visit l/m. NF 04/22/18

## 2018-04-25 ENCOUNTER — Ambulatory Visit (INDEPENDENT_AMBULATORY_CARE_PROVIDER_SITE_OTHER): Payer: Self-pay | Admitting: Internal Medicine

## 2018-04-26 ENCOUNTER — Ambulatory Visit (HOSPITAL_COMMUNITY): Payer: PRIVATE HEALTH INSURANCE | Attending: Orthopedic Surgery

## 2018-04-26 ENCOUNTER — Encounter (HOSPITAL_COMMUNITY): Payer: Self-pay

## 2018-04-26 ENCOUNTER — Ambulatory Visit (INDEPENDENT_AMBULATORY_CARE_PROVIDER_SITE_OTHER): Payer: Self-pay | Admitting: Internal Medicine

## 2018-04-26 ENCOUNTER — Other Ambulatory Visit: Payer: Self-pay

## 2018-04-26 DIAGNOSIS — M25512 Pain in left shoulder: Secondary | ICD-10-CM | POA: Diagnosis present

## 2018-04-26 DIAGNOSIS — R29898 Other symptoms and signs involving the musculoskeletal system: Secondary | ICD-10-CM | POA: Insufficient documentation

## 2018-04-26 DIAGNOSIS — M25612 Stiffness of left shoulder, not elsewhere classified: Secondary | ICD-10-CM | POA: Diagnosis present

## 2018-04-26 NOTE — Therapy (Addendum)
[place], [place], [address] Phone: [phone]   Fax:  [phone]  Occupational Therapy Treatment  Patient Details  Name: [name] MRN: [id number] Date of Birth: 1966/03/02 Referring Provider: Wylene Simmer, MD   Encounter Date: 04/26/2018  OT End of Session - 04/26/18 3976    Visit Number  25    Number of Visits  32    Date for OT Re-Evaluation  05/03/18    Authorization Type  Workers compensation approved 16 visits for her shoulder. On 03/11/18, WC approved an additional 8 visits for her shoulder, On 7/15 requested 8 additional visits from [facility]. Update: Workers Comp approved 12 additional visits.    Authorization - Visit Number  1    Authorization - Number of Visits  12    OT Start Time  7341    OT Stop Time  1729    OT Time Calculation (min)  40 min    Activity Tolerance  Patient tolerated treatment well    Behavior During Therapy  WFL for tasks assessed/performed       Past Medical History:  Diagnosis Date  . Arthritis   . Asthma   . Chest pain    cath 2005 norm cors, repeat cath Feb 2011 normal cors and only minimally  elevated pulmonary pressures  . Crohn disease ([place])   . Diverticulosis   . GERD (gastroesophageal reflux disease)   . Hiatal hernia   . Hypertension   . Obesity   . Palpitations   . Sleep apnea sleep study 11/03/2009    cpap; does sometimes, doesnt use every night.    Past Surgical History:  Procedure Laterality Date  . ABDOMINAL HYSTERECTOMY    . ABDOMINAL HYSTERECTOMY    . ANKLE ARTHROSCOPY  06/01/2012   Procedure: ANKLE ARTHROSCOPY;  Surgeon: Colin Rhein, MD;  Location: [facility];  Service: Orthopedics;  Laterality: Left;  left ankle arthorsocpy with extensive debridement and gastroc slide  . CARDIAC CATHETERIZATION  11/22/2009 and 2005   WNL  . CESAREAN SECTION    . CHOLECYSTECTOMY  09/08/2006   lap. chole.  . CHONDROPLASTY  06/17/2012   Procedure:  CHONDROPLASTY;  Surgeon: Carole Civil, MD;  [facility];  Service: Orthopedics;  Laterality: Right;  right patella  . ESOPHAGOGASTRODUODENOSCOPY N/A 05/14/2015   Procedure: ESOPHAGOGASTRODUODENOSCOPY (EGD);  Surgeon: Rogene Houston, MD;  [facility];  Service: Endoscopy;  Laterality: N/A;  730  . INGUINAL HERNIA REPAIR  10/30/2008   right  . KNEE ARTHROSCOPY WITH LATERAL MENISECTOMY Left 12/23/2016   Procedure: LEFT KNEE ARTHROSCOPY WITH LATERAL MENISECTOMY;  Surgeon: Carole Civil, MD;  [facility];  Service: Orthopedics;  Laterality: Left;  . KNEE ARTHROSCOPY WITH MEDIAL MENISECTOMY Left 12/23/2016   Procedure: LEFT KNEE ARTHROSCOPY WITH MEDIAL MENISECTOMY CHONDROPLASTY PATELLA  AND MEDIAL FEMORAL CONDYLE LEFT KNEE;  Surgeon: Carole Civil, MD;  [facility];  Service: Orthopedics;  Laterality: Left;  . SHOULDER ARTHROSCOPY WITH SUBACROMIAL DECOMPRESSION AND BICEP TENDON REPAIR Left 12/21/2017   Procedure: Left shoulder arthroscopic biceps tenodesis, SAD, DCR and labrum debridement;  Surgeon: Nicholes Stairs, MD;  Location: [facility];  Service: Orthopedics;  Laterality: Left;  120 mins  . SHOULDER SURGERY     right x 2     There were no vitals filed for this visit.  Subjective Assessment - 04/26/18 1653    Subjective   S:  It's like a deep  ache. Probably just because of the rain.    Currently in Pain?  Yes    Pain Score  5     Pain Location  Shoulder    Pain Orientation  Left    Pain Descriptors / Indicators  Aching;Sore    Pain Type  Acute pain    Pain Radiating Towards  N/A    Pain Onset  More than a month ago    Pain Frequency  Intermittent    Aggravating Factors   use    Pain Relieving Factors  rest    Effect of Pain on Daily Activities  minimal     Multiple Pain Sites  No         [facility] OT Assessment - 04/26/18 1649      Assessment   Medical Diagnosis  Left shoulder SLAP, debridement, and bicep tenodesis      Precautions    Precautions  Shoulder    Type of Shoulder Precautions  Follow Standard SLAP repair protocol. No lifting over 5#               OT Treatments/Exercises (OP) - 04/26/18 1650      Exercises   Exercises  Shoulder      Shoulder Exercises: Supine   Protraction  PROM;5 reps;Strengthening;12 reps    Protraction Weight (lbs)  2    Horizontal ABduction  PROM;5 reps;Strengthening;12 reps    Horizontal ABduction Weight (lbs)  2    External Rotation  PROM;5 reps;Strengthening;12 reps    External Rotation Weight (lbs)  2    Internal Rotation  PROM;5 reps;Strengthening;12 reps    Internal Rotation Weight (lbs)  2    Flexion  PROM;5 reps;Strengthening;12 reps    Shoulder Flexion Weight (lbs)  2    ABduction  PROM;5 reps;Strengthening;12 reps    Shoulder ABduction Weight (lbs)  2      Shoulder Exercises: Sidelying   External Rotation  Strengthening;15 reps    External Rotation Weight (lbs)  1    Internal Rotation  Strengthening;15 reps    Internal Rotation Weight (lbs)  1    Flexion  Strengthening;15 reps    Flexion Weight (lbs)  1    ABduction  Strengthening;15 reps    ABduction Weight (lbs)  1    Other Sidelying Exercises  protraction; strengthening; 2#; 10X    Other Sidelying Exercises  horizontal abduction; strengthening; 15X; 1#      Shoulder Exercises: ROM/Strengthening   X to V Arms  5X; 2#    Proximal Shoulder Strengthening, Supine  12X with 2#      Manual Therapy   Manual Therapy  Myofascial release    Manual therapy comments  completed seperately from all other skilled interventions    Myofascial Release  myofascial release to left shoulder, trapezius, scapular, and upper arm region to decrease pain and improve pain free mobility.              OT Education - 04/26/18 1715    Education provided  No       OT Short Term Goals - 04/26/18 1740      OT SHORT TERM GOAL #1   Title  Patient will be educated and independent with HEP for left shoulder mobility for  improved abiilty to return to normal work and daily tasks.     Time  6    Period  Weeks      OT SHORT TERM GOAL #2   Title  patient will improve  Left shoulder P/ROM to WNL to increase ability to complete dressing tasks with less difficulty.     Time  6    Period  Weeks      OT SHORT TERM GOAL #3   Title  Patient will increase left shoulder strength to 3+/5 in order to complete lightweight household actiivities with LUE.     Time  6      OT SHORT TERM GOAL #4   Title  Patient will decrease pain level in left shoulder to 5/10 with daily task completion.     Time  6    Period  Weeks      OT SHORT TERM GOAL #5   Title  Patient will decrease fascial restrictions in LUE to mod amount in order to increase functional mobility needed to complete overhead and reaching tasks.     Time  6    Period  Weeks        OT Long Term Goals - 04/26/18 1740      OT LONG TERM GOAL #1   Title  Patient will return to highest level of independence with her LUE in order to return to work and complete required tasks.     Time  12    Period  Weeks    Status  On-going      OT LONG TERM GOAL #2   Title  patient will increase A/ROM of LUE to WNL to increase ability to complete over head reaching tasks without difficulty.     Time  12    Period  Weeks    Status  On-going      OT LONG TERM GOAL #3   Title  Patient will increase LUE strength to 4+/5 to increase ability to complete chest compressions during CPR when needed.     Time  12    Period  Weeks      OT LONG TERM GOAL #4   Title  Patient will decrease fascial restrictions to min amount or less in order to increase functional mobility needed for overhead and reaching tasks.     Time  12    Period  Weeks    Status  On-going      OT LONG TERM GOAL #5   Title  Patient will report a decreased level of pain of approximately 2/10 when completing work or daily tasks.     Time  12    Period  Weeks    Status  On-going      OT LONG TERM GOAL #6    Title  Patient will increase LUE strength to5/5 to increase ability to complete chest compressions during CPR when needed.     Time  4    Period  Weeks    Status  On-going            Plan - 04/26/18 1721    Clinical Impression Statement  A: Patient presenting with increased fascial restrictions in lateral arm and upper trapezius regions. Patient resumed sidelying strengthening this session. Patient able to increase reps for both sidelying and supine strengthening exercises. Attempted strengthening in sidelying with 2# but patient unable to [facility] proper form. Weight decreased to 1# with patient demonstrating good form Resumed with 2# in supine. VC for technique.    Plan  P:  Attempt ultrasound at beginning of session to break up scar tissue and increase ROM.  Attempt 2# in sidelying but decrease if impacting form. Resume standing strengthening. Add wall walks with  loop theraband.    Consulted and Agree with Plan of Care  Patient       Patient will benefit from skilled therapeutic intervention in order to improve the following deficits and impairments:  Pain, Decreased range of motion, Impaired UE functional use, Increased fascial restrictions, Decreased strength, Impaired flexibility  Visit Diagnosis: Other symptoms and signs involving the musculoskeletal system  Acute pain of left shoulder  Stiffness of left shoulder, not elsewhere classified    Problem List Patient Active Problem List   Diagnosis Date Noted  . Derangement of anterior horn of lateral meniscus of left knee   . Derangement of posterior horn of medial meniscus of left knee   . Chest pain 04/25/2016  . Hypokalemia 04/25/2016  . Hyperglycemia 04/25/2016  . Diverticulitis of colon 07/17/2014  . Nausea without vomiting 07/17/2014  . Crohn disease ([place]) 07/17/2014  . Diverticulitis of colon without hemorrhage 08/10/2013  . History of arthroscopy of right knee 07/25/2012  . Difficulty in walking(719.7) 06/29/2012   . Weakness of right leg 06/29/2012  . Posterior tibial tendonitis 11/11/2011  . PTTD (posterior tibial tendon dysfunction) 11/11/2011  . Knee pain, right 11/11/2011  . Chest pain 07/16/2011  . Primary osteoarthritis of left knee 02/25/2010  . PAIN IN JOINT, MULTIPLE SITES 02/25/2010  . Essential hypertension 08/13/2009  . HIATAL HERNIA 08/13/2009  . PALPITATIONS 08/13/2009  . DYSPNEA 08/13/2009    Roderic Palau, OT student 04/26/2018, 5:33 PM  [facility] [address] [place], [place], [address] Phone: [phone]   Fax:  [phone]  Name: [name] MRN: [id number] Date of Birth: December 28, 1965

## 2018-04-28 ENCOUNTER — Other Ambulatory Visit (HOSPITAL_COMMUNITY): Payer: Self-pay | Admitting: Family Medicine

## 2018-04-28 DIAGNOSIS — R928 Other abnormal and inconclusive findings on diagnostic imaging of breast: Secondary | ICD-10-CM

## 2018-04-28 MED FILL — ONDANSETRON ODT 4 MG TABLET: 4 | 5 days supply | Qty: 20 | Fill #0

## 2018-04-28 MED FILL — oxyCODONE HCL 5 MG TABS: 5 | 3 days supply | Qty: 30 | Fill #0

## 2018-04-29 ENCOUNTER — Ambulatory Visit (HOSPITAL_COMMUNITY): Payer: PRIVATE HEALTH INSURANCE | Attending: Orthopedic Surgery | Admitting: Occupational Therapy

## 2018-04-29 ENCOUNTER — Encounter (HOSPITAL_COMMUNITY): Payer: Self-pay | Admitting: Occupational Therapy

## 2018-04-29 DIAGNOSIS — M25612 Stiffness of left shoulder, not elsewhere classified: Secondary | ICD-10-CM | POA: Insufficient documentation

## 2018-04-29 DIAGNOSIS — M25512 Pain in left shoulder: Secondary | ICD-10-CM | POA: Diagnosis present

## 2018-04-29 DIAGNOSIS — R29898 Other symptoms and signs involving the musculoskeletal system: Secondary | ICD-10-CM | POA: Insufficient documentation

## 2018-04-29 NOTE — Therapy (Signed)
[place], [place], [address] Phone: [phone]   Fax:  [phone]  Occupational Therapy Treatment  Patient Details  Name: [name] MRN: [id number] Date of Birth: [birth year] Referring Provider: Wylene Simmer, MD   Encounter Date: 04/29/2018  OT End of Session - 04/29/18 1616    Visit Number  26    Number of Visits  32    Date for OT Re-Evaluation  05/03/18    Authorization Type  Workers compensation approved 16 visits for her shoulder. On 03/11/18, WC approved an additional 8 visits for her shoulder, On 7/15 requested 8 additional visits from [facility]. Update: Workers Comp approved 12 additional visits.    Authorization - Visit Number  2    Authorization - Number of Visits  12    OT Start Time  8185    OT Stop Time  1608    OT Time Calculation (min)  50 min    Activity Tolerance  Patient tolerated treatment well    Behavior During Therapy  WFL for tasks assessed/performed       Past Medical History:  Diagnosis Date  . Arthritis   . Asthma   . Chest pain    cath 2005 norm cors, repeat cath Feb 2011 normal cors and only minimally  elevated pulmonary pressures  . Crohn disease ([place])   . Diverticulosis   . GERD (gastroesophageal reflux disease)   . Hiatal hernia   . Hypertension   . Obesity   . Palpitations   . Sleep apnea sleep study 11/03/2009    cpap; does sometimes, doesnt use every night.    Past Surgical History:  Procedure Laterality Date  . ABDOMINAL HYSTERECTOMY    . ABDOMINAL HYSTERECTOMY    . ANKLE ARTHROSCOPY  06/01/2012   Procedure: ANKLE ARTHROSCOPY;  Surgeon: Colin Rhein, MD;  Location: [facility];  Service: Orthopedics;  Laterality: Left;  left ankle arthorsocpy with extensive debridement and gastroc slide  . CARDIAC CATHETERIZATION  11/22/2009 and 2005   WNL  . CESAREAN SECTION    . CHOLECYSTECTOMY  09/08/2006   lap. chole.  . CHONDROPLASTY  06/17/2012   Procedure:  CHONDROPLASTY;  Surgeon: Carole Civil, MD;  [facility];  Service: Orthopedics;  Laterality: Right;  right patella  . ESOPHAGOGASTRODUODENOSCOPY N/A 05/14/2015   Procedure: ESOPHAGOGASTRODUODENOSCOPY (EGD);  Surgeon: Rogene Houston, MD;  [facility];  Service: Endoscopy;  Laterality: N/A;  730  . INGUINAL HERNIA REPAIR  10/30/2008   right  . KNEE ARTHROSCOPY WITH LATERAL MENISECTOMY Left 12/23/2016   Procedure: LEFT KNEE ARTHROSCOPY WITH LATERAL MENISECTOMY;  Surgeon: Carole Civil, MD;  [facility];  Service: Orthopedics;  Laterality: Left;  . KNEE ARTHROSCOPY WITH MEDIAL MENISECTOMY Left 12/23/2016   Procedure: LEFT KNEE ARTHROSCOPY WITH MEDIAL MENISECTOMY CHONDROPLASTY PATELLA  AND MEDIAL FEMORAL CONDYLE LEFT KNEE;  Surgeon: Carole Civil, MD;  [facility];  Service: Orthopedics;  Laterality: Left;  . SHOULDER ARTHROSCOPY WITH SUBACROMIAL DECOMPRESSION AND BICEP TENDON REPAIR Left 12/21/2017   Procedure: Left shoulder arthroscopic biceps tenodesis, SAD, DCR and labrum debridement;  Surgeon: Nicholes Stairs, MD;  Location: [facility];  Service: Orthopedics;  Laterality: Left;  120 mins  . SHOULDER SURGERY     right x 2     There were no vitals filed for this visit.  Subjective Assessment - 04/29/18 1517    Subjective   S: I feel like something is  tighter in my shoulder.     Currently in Pain?  Yes    Pain Score  3     Pain Location  Shoulder    Pain Orientation  Left    Pain Descriptors / Indicators  Aching;Sore    Pain Type  Acute pain    Pain Radiating Towards  N/A    Pain Onset  More than a month ago    Pain Frequency  Intermittent    Aggravating Factors   use    Pain Relieving Factors  rest    Effect of Pain on Daily Activities  minimal effect on ADLs    Multiple Pain Sites  No         [facility] OT Assessment - 04/29/18 1517      Assessment   Medical Diagnosis  Left shoulder SLAP, debridement, and bicep tenodesis      Precautions    Precautions  Shoulder    Type of Shoulder Precautions  Follow Standard SLAP repair protocol. No lifting over 5#               OT Treatments/Exercises (OP) - 04/29/18 1610      Exercises   Exercises  Shoulder      Shoulder Exercises: Supine   Protraction  PROM;5 reps    Horizontal ABduction  PROM;5 reps    External Rotation  PROM;5 reps    Internal Rotation  PROM;5 reps    Flexion  PROM;5 reps    ABduction  PROM;5 reps      Shoulder Exercises: Seated   Protraction  Theraband;12 reps    Theraband Level (Shoulder Protraction)  Level 2 (Red)    Horizontal ABduction  Theraband;12 reps    Theraband Level (Shoulder Horizontal ABduction)  Level 2 (Red)    External Rotation  Theraband;12 reps shoulder and elbow flexed to 90    Theraband Level (Shoulder External Rotation)  Level 2 (Red)    Internal Rotation  Theraband;12 reps shoulder and elbow flexed to 90    Theraband Level (Shoulder Internal Rotation)  Level 2 (Red)    Flexion  Theraband;12 reps    Theraband Level (Shoulder Flexion)  Level 2 (Red)    Abduction  Theraband;12 reps    Theraband Level (Shoulder ABduction)  Level 2 (Red)      Shoulder Exercises: Stretch   Corner Stretch  1 rep;20 seconds    External Rotation Stretch  1 rep;20 seconds    Other Shoulder Stretches  pectoralis stretch at wall 1 rep, 20"      Modalities   Modalities  Ultrasound      Ultrasound   Ultrasound Location  left shoulder-anterior and middle deltoid regions     Ultrasound Parameters  1.5 W/cm2    Ultrasound Goals  Pain      Manual Therapy   Manual Therapy  Soft tissue mobilization;Muscle Energy Technique    Manual therapy comments  completed seperately from all other skilled interventions    Soft tissue mobilization  Pectoralis minor release, teres minor release, subscapularis release to left shoulder to decrease fascial restrictions and tightness and improve LUE ROM and scapular mobility    Muscle Energy Technique  MET to left  anterior deltoid and subscapularis to improve flexion and er             OT Education - 04/29/18 1615    Education provided  Yes    Education Details  educated on stretches to perform: wall stretch-flexion, corner stretch, er, and  pectoralis stretch, 20" per hold    Person(s) Educated  Patient    Methods  Explanation;Demonstration    Comprehension  Verbalized understanding;Returned demonstration       OT Short Term Goals - 04/26/18 1740      OT SHORT TERM GOAL #1   Title  Patient will be educated and independent with HEP for left shoulder mobility for improved abiilty to return to normal work and daily tasks.     Time  6    Period  Weeks      OT SHORT TERM GOAL #2   Title  patient will improve Left shoulder P/ROM to WNL to increase ability to complete dressing tasks with less difficulty.     Time  6    Period  Weeks      OT SHORT TERM GOAL #3   Title  Patient will increase left shoulder strength to 3+/5 in order to complete lightweight household actiivities with LUE.     Time  6      OT SHORT TERM GOAL #4   Title  Patient will decrease pain level in left shoulder to 5/10 with daily task completion.     Time  6    Period  Weeks      OT SHORT TERM GOAL #5   Title  Patient will decrease fascial restrictions in LUE to mod amount in order to increase functional mobility needed to complete overhead and reaching tasks.     Time  6    Period  Weeks        OT Long Term Goals - 04/26/18 1740      OT LONG TERM GOAL #1   Title  Patient will return to highest level of independence with her LUE in order to return to work and complete required tasks.     Time  12    Period  Weeks    Status  On-going      OT LONG TERM GOAL #2   Title  patient will increase A/ROM of LUE to WNL to increase ability to complete over head reaching tasks without difficulty.     Time  12    Period  Weeks    Status  On-going      OT LONG TERM GOAL #3   Title  Patient will increase LUE strength  to 4+/5 to increase ability to complete chest compressions during CPR when needed.     Time  12    Period  Weeks      OT LONG TERM GOAL #4   Title  Patient will decrease fascial restrictions to min amount or less in order to increase functional mobility needed for overhead and reaching tasks.     Time  12    Period  Weeks    Status  On-going      OT LONG TERM GOAL #5   Title  Patient will report a decreased level of pain of approximately 2/10 when completing work or daily tasks.     Time  12    Period  Weeks    Status  On-going      OT LONG TERM GOAL #6   Title  Patient will increase LUE strength to5/5 to increase ability to complete chest compressions during CPR when needed.     Time  4    Period  Weeks    Status  On-going            Plan - 04/29/18 1617  Clinical Impression Statement  A: Session focusing on stretching and improving ROM today. [place] used at beginning of session at anterior/middle deltoid regions to reduce pain and fascial restrictions. Pt with extremely tight pectoralis minor tendon, pectoralis release completed to reduce tightness and improve ROM. Also completed teres minor and subscapularis releases along with MET, pt with good response achieving greater flexion and er ROM with passive stretching. Pt completed theraband strengthening and shoulder stretches today, educated on HEP to complete over weekend. Verbal cuing for form and technique during session.     Plan  P: Reassessment. Follow up on stretching, continue with tendon releases and MET working on improving ROM. Add red loop theraband lateral and diagonal wall walks.        Patient will benefit from skilled therapeutic intervention in order to improve the following deficits and impairments:  Pain, Decreased range of motion, Impaired UE functional use, Increased fascial restrictions, Decreased strength, Impaired flexibility  Visit Diagnosis: Other symptoms and signs involving the musculoskeletal  system  Acute pain of left shoulder  Stiffness of left shoulder, not elsewhere classified    Problem List Patient Active Problem List   Diagnosis Date Noted  . Derangement of anterior horn of lateral meniscus of left knee   . Derangement of posterior horn of medial meniscus of left knee   . Chest pain 04/25/2016  . Hypokalemia 04/25/2016  . Hyperglycemia 04/25/2016  . Diverticulitis of colon 07/17/2014  . Nausea without vomiting 07/17/2014  . Crohn disease ([place]) 07/17/2014  . Diverticulitis of colon without hemorrhage 08/10/2013  . History of arthroscopy of right knee 07/25/2012  . Difficulty in walking(719.7) 06/29/2012  . Weakness of right leg 06/29/2012  . Posterior tibial tendonitis 11/11/2011  . PTTD (posterior tibial tendon dysfunction) 11/11/2011  . Knee pain, right 11/11/2011  . Chest pain 07/16/2011  . Primary osteoarthritis of left knee 02/25/2010  . PAIN IN JOINT, MULTIPLE SITES 02/25/2010  . Essential hypertension 08/13/2009  . HIATAL HERNIA 08/13/2009  . PALPITATIONS 08/13/2009  . DYSPNEA 08/13/2009   Guadelupe Sabin, OTR/L  [phone] 04/29/2018, 4:21 PM  [facility] [address] [place], [place], [address] Phone: [phone]   Fax:  [phone]  Name: [name] MRN: [id number] Date of Birth: 11/04/1965

## 2018-05-03 ENCOUNTER — Encounter (HOSPITAL_COMMUNITY): Payer: Self-pay

## 2018-05-03 ENCOUNTER — Ambulatory Visit (HOSPITAL_COMMUNITY)
Admission: RE | Admit: 2018-05-03 | Discharge: 2018-05-03 | Disposition: A | Discharge status: Home / Self Care | Payer: 59 | Source: Ambulatory Visit | Attending: Family Medicine | Admitting: Family Medicine

## 2018-05-03 ENCOUNTER — Encounter (HOSPITAL_COMMUNITY): Payer: Self-pay | Admitting: Occupational Therapy

## 2018-05-03 ENCOUNTER — Ambulatory Visit (HOSPITAL_COMMUNITY): Payer: PRIVATE HEALTH INSURANCE | Attending: Family Medicine | Admitting: Occupational Therapy

## 2018-05-03 DIAGNOSIS — R29898 Other symptoms and signs involving the musculoskeletal system: Secondary | ICD-10-CM | POA: Diagnosis not present

## 2018-05-03 DIAGNOSIS — M25612 Stiffness of left shoulder, not elsewhere classified: Secondary | ICD-10-CM | POA: Diagnosis not present

## 2018-05-03 DIAGNOSIS — R928 Other abnormal and inconclusive findings on diagnostic imaging of breast: Secondary | ICD-10-CM | POA: Diagnosis not present

## 2018-05-03 DIAGNOSIS — M25512 Pain in left shoulder: Secondary | ICD-10-CM | POA: Insufficient documentation

## 2018-05-03 NOTE — Therapy (Signed)
[place], [place], [address] Phone: [phone]   Fax:  [phone]  Occupational Therapy Reassessment, Treatment (recertification)  Patient Details  Name: [name] MRN: [id number] Date of Birth: [birth year] Referring Provider: Wylene Simmer, MD   Encounter Date: 05/03/2018  OT End of Session - 05/03/18 1738    Visit Number  27    Number of Visits  32    Date for OT Re-Evaluation  06/02/18    Authorization Type  Workers compensation approved 16 visits for her shoulder. On 03/11/18, WC approved an additional 8 visits for her shoulder, On 7/15 requested 8 additional visits from [facility]. Update: Workers Comp approved 12 additional visits.    Authorization - Visit Number  3    Authorization - Number of Visits  12    OT Start Time  6962    OT Stop Time  9528    OT Time Calculation (min)  44 min    Activity Tolerance  Patient tolerated treatment well    Behavior During Therapy  WFL for tasks assessed/performed       Past Medical History:  Diagnosis Date  . Arthritis   . Asthma   . Chest pain    cath 2005 norm cors, repeat cath Feb 2011 normal cors and only minimally  elevated pulmonary pressures  . Crohn disease ([place])   . Diverticulosis   . GERD (gastroesophageal reflux disease)   . Hiatal hernia   . Hypertension   . Obesity   . Palpitations   . Sleep apnea sleep study 11/03/2009    cpap; does sometimes, doesnt use every night.    Past Surgical History:  Procedure Laterality Date  . ABDOMINAL HYSTERECTOMY    . ABDOMINAL HYSTERECTOMY    . ANKLE ARTHROSCOPY  06/01/2012   Procedure: ANKLE ARTHROSCOPY;  Surgeon: Colin Rhein, MD;  Location: [place];  Service: Orthopedics;  Laterality: Left;  left ankle arthorsocpy with extensive debridement and gastroc slide  . CARDIAC CATHETERIZATION  11/22/2009 and 2005   WNL  . CESAREAN SECTION    . CHOLECYSTECTOMY  09/08/2006   lap. chole.  .  CHONDROPLASTY  06/17/2012   Procedure: CHONDROPLASTY;  Surgeon: Carole Civil, MD;  [facility];  Service: Orthopedics;  Laterality: Right;  right patella  . ESOPHAGOGASTRODUODENOSCOPY N/A 05/14/2015   Procedure: ESOPHAGOGASTRODUODENOSCOPY (EGD);  Surgeon: Rogene Houston, MD;  [facility];  Service: Endoscopy;  Laterality: N/A;  730  . INGUINAL HERNIA REPAIR  10/30/2008   right  . KNEE ARTHROSCOPY WITH LATERAL MENISECTOMY Left 12/23/2016   Procedure: LEFT KNEE ARTHROSCOPY WITH LATERAL MENISECTOMY;  Surgeon: Carole Civil, MD;  [facility];  Service: Orthopedics;  Laterality: Left;  . KNEE ARTHROSCOPY WITH MEDIAL MENISECTOMY Left 12/23/2016   Procedure: LEFT KNEE ARTHROSCOPY WITH MEDIAL MENISECTOMY CHONDROPLASTY PATELLA  AND MEDIAL FEMORAL CONDYLE LEFT KNEE;  Surgeon: Carole Civil, MD;  [facility];  Service: Orthopedics;  Laterality: Left;  . SHOULDER ARTHROSCOPY WITH SUBACROMIAL DECOMPRESSION AND BICEP TENDON REPAIR Left 12/21/2017   Procedure: Left shoulder arthroscopic biceps tenodesis, SAD, DCR and labrum debridement;  Surgeon: Nicholes Stairs, MD;  Location: [place];  Service: Orthopedics;  Laterality: Left;  120 mins  . SHOULDER SURGERY     right x 2     There were no vitals filed for this visit.  Subjective Assessment - 05/03/18 1436    Subjective   S: Tomorrow will be  my last session before my surgery.     Currently in Pain?  No/denies         [facility] OT Assessment - 05/03/18 1435      Assessment   Medical Diagnosis  Left shoulder SLAP, debridement, and bicep tenodesis      Precautions   Precautions  Shoulder    Type of Shoulder Precautions  Follow Standard SLAP repair protocol. No lifting over 5#      Observation/Other Assessments   Focus on Therapeutic Outcomes (FOTO)   53/100      AROM   Overall AROM Comments  Assessed seated. IR/er adducted    Left Shoulder Flexion  148 Degrees 145 previous    Left Shoulder ABduction  165  Degrees 120 previous    Left Shoulder Internal Rotation  90 Degrees same as previous    Left Shoulder External Rotation  60 Degrees same as previous      PROM   Overall PROM Comments  Assessed supine. IR/er adducted    Left Shoulder Flexion  152 Degrees 130 previous    Left Shoulder ABduction  180 Degrees same as previous    Left Shoulder Internal Rotation  90 Degrees same as previous    Left Shoulder External Rotation  70 Degrees 64 previous      Strength   Overall Strength Comments  assessed seated. IR/er adducted    Left Shoulder Flexion  4+/5 same as previous    Left Shoulder ABduction  4-/5 same as previous    Left Shoulder Internal Rotation  4+/5 same as previous    Left Shoulder External Rotation  4+/5 same as previous               OT Treatments/Exercises (OP) - 05/03/18 1436      Exercises   Exercises  Shoulder      Shoulder Exercises: Supine   Protraction  PROM;5 reps    Horizontal ABduction  PROM;5 reps    External Rotation  PROM;5 reps    Internal Rotation  PROM;5 reps    Flexion  PROM;5 reps    ABduction  PROM;5 reps      Shoulder Exercises: Seated   Protraction  Theraband;15 reps    Theraband Level (Shoulder Protraction)  Level 2 (Red)    Horizontal ABduction  Theraband;15 reps    Theraband Level (Shoulder Horizontal ABduction)  Level 2 (Red)    External Rotation  Theraband;15 reps    Theraband Level (Shoulder External Rotation)  Level 2 (Red)    Internal Rotation  Theraband;15 reps    Theraband Level (Shoulder Internal Rotation)  Level 2 (Red)    Flexion  Theraband;15 reps    Theraband Level (Shoulder Flexion)  Level 2 (Red)    Abduction  Theraband;15 reps    Theraband Level (Shoulder ABduction)  Level 2 (Red)      Modalities   Modalities  Ultrasound      Ultrasound   Ultrasound Location  left shoulder-anterior deltoid    Ultrasound Parameters  1.5 W/cm2    Ultrasound Goals  Pain      Manual Therapy   Manual Therapy  Soft tissue  mobilization;Muscle Energy Technique    Manual therapy comments  completed seperately from all other skilled interventions    Soft tissue mobilization  Pectoralis minor release, teres minor release, subscapularis release to left shoulder to decrease fascial restrictions and tightness and improve LUE ROM and scapular mobility    Muscle Energy Technique  MET to left  anterior deltoid and subscapularis to improve flexion and er               OT Short Term Goals - 04/26/18 1740      OT SHORT TERM GOAL #1   Title  Patient will be educated and independent with HEP for left shoulder mobility for improved abiilty to return to normal work and daily tasks.     Time  6    Period  Weeks      OT SHORT TERM GOAL #2   Title  patient will improve Left shoulder P/ROM to WNL to increase ability to complete dressing tasks with less difficulty.     Time  6    Period  Weeks      OT SHORT TERM GOAL #3   Title  Patient will increase left shoulder strength to 3+/5 in order to complete lightweight household actiivities with LUE.     Time  6      OT SHORT TERM GOAL #4   Title  Patient will decrease pain level in left shoulder to 5/10 with daily task completion.     Time  6    Period  Weeks      OT SHORT TERM GOAL #5   Title  Patient will decrease fascial restrictions in LUE to mod amount in order to increase functional mobility needed to complete overhead and reaching tasks.     Time  6    Period  Weeks        OT Long Term Goals - 04/26/18 1740      OT LONG TERM GOAL #1   Title  Patient will return to highest level of independence with her LUE in order to return to work and complete required tasks.     Time  12    Period  Weeks    Status  On-going      OT LONG TERM GOAL #2   Title  patient will increase A/ROM of LUE to WNL to increase ability to complete over head reaching tasks without difficulty.     Time  12    Period  Weeks    Status  On-going      OT LONG TERM GOAL #3   Title   Patient will increase LUE strength to 4+/5 to increase ability to complete chest compressions during CPR when needed.     Time  12    Period  Weeks      OT LONG TERM GOAL #4   Title  Patient will decrease fascial restrictions to min amount or less in order to increase functional mobility needed for overhead and reaching tasks.     Time  12    Period  Weeks    Status  On-going      OT LONG TERM GOAL #5   Title  Patient will report a decreased level of pain of approximately 2/10 when completing work or daily tasks.     Time  12    Period  Weeks    Status  On-going      OT LONG TERM GOAL #6   Title  Patient will increase LUE strength to5/5 to increase ability to complete chest compressions during CPR when needed.     Time  4    Period  Weeks    Status  On-going            Plan - 05/03/18 1739    Clinical Impression Statement  A: Reassessment completed  this session, pt has met all STGs however has not met any LTGs at this time. Pt is making slow progress with ROM and strength. Pt continues to have max tendon tightness at pectoralis minor and anterior shoulder junction which was addressed during manual therapy today. Pt is responding well to manual releases along with MET for improved flexion and er ROM. Pt's primary deficit is strength required for functional task completion at this time. Pt will benefit from continued therapy services to work on improving strength required for ADL and work tasks. Of note: Pt will be having ankle sx on 8/7 and is unsure when she will be able to return to therapy after surgery.     Rehab Potential  Good    OT Frequency  2x / week    OT Duration  4 weeks    OT Treatment/Interventions  Self-care/ADL training;Therapeutic exercise;Ultrasound;Manual Therapy;Therapeutic activities;Cryotherapy;Electrical Stimulation;Moist Heat;Passive range of motion;Patient/family education    Plan  P: continue with manual therapy releases to address restrictions limiting  ROM. Add red loop theraband for lateral and diagonal wall walks. Resume stretches       Patient will benefit from skilled therapeutic intervention in order to improve the following deficits and impairments:  Pain, Decreased range of motion, Impaired UE functional use, Increased fascial restrictions, Decreased strength, Impaired flexibility  Visit Diagnosis: Other symptoms and signs involving the musculoskeletal system  Acute pain of left shoulder  Stiffness of left shoulder, not elsewhere classified    Problem List Patient Active Problem List   Diagnosis Date Noted  . Derangement of anterior horn of lateral meniscus of left knee   . Derangement of posterior horn of medial meniscus of left knee   . Chest pain 04/25/2016  . Hypokalemia 04/25/2016  . Hyperglycemia 04/25/2016  . Diverticulitis of colon 07/17/2014  . Nausea without vomiting 07/17/2014  . Crohn disease ([place]) 07/17/2014  . Diverticulitis of colon without hemorrhage 08/10/2013  . History of arthroscopy of right knee 07/25/2012  . Difficulty in walking(719.7) 06/29/2012  . Weakness of right leg 06/29/2012  . Posterior tibial tendonitis 11/11/2011  . PTTD (posterior tibial tendon dysfunction) 11/11/2011  . Knee pain, right 11/11/2011  . Chest pain 07/16/2011  . Primary osteoarthritis of left knee 02/25/2010  . PAIN IN JOINT, MULTIPLE SITES 02/25/2010  . Essential hypertension 08/13/2009  . HIATAL HERNIA 08/13/2009  . PALPITATIONS 08/13/2009  . DYSPNEA 08/13/2009   Guadelupe Sabin, OTR/L  [phone] 05/03/2018, 5:44 PM  [facility] [address] [place], [place], [address] Phone: [phone]   Fax:  [phone]  Name: [name] MRN: [id number] Date of Birth: 1966/08/14

## 2018-05-04 ENCOUNTER — Ambulatory Visit (HOSPITAL_COMMUNITY): Payer: PRIVATE HEALTH INSURANCE | Admitting: Occupational Therapy

## 2018-05-05 ENCOUNTER — Encounter (HOSPITAL_COMMUNITY): Payer: Self-pay | Admitting: Specialist

## 2018-05-05 ENCOUNTER — Ambulatory Visit (HOSPITAL_COMMUNITY): Payer: PRIVATE HEALTH INSURANCE | Attending: Family Medicine | Admitting: Specialist

## 2018-05-05 DIAGNOSIS — M25512 Pain in left shoulder: Secondary | ICD-10-CM | POA: Diagnosis not present

## 2018-05-05 DIAGNOSIS — R29898 Other symptoms and signs involving the musculoskeletal system: Secondary | ICD-10-CM | POA: Insufficient documentation

## 2018-05-05 DIAGNOSIS — M25612 Stiffness of left shoulder, not elsewhere classified: Secondary | ICD-10-CM | POA: Insufficient documentation

## 2018-05-05 NOTE — Therapy (Signed)
[place], [place], [address] Phone: [phone]   Fax:  [phone]  Occupational Therapy Treatment  Patient Details  Name: MARSHAE AZAM MRN: [id number] Date of Birth: [birth year] Referring Provider: Wylene Simmer, MD   Encounter Date: 05/05/2018  OT End of Session - 05/05/18 1533    Visit Number  28    Number of Visits  32    Date for OT Re-Evaluation  06/02/18    Authorization Type  Workers compensation approved 16 visits for her shoulder. On 03/11/18, WC approved an additional 8 visits for her shoulder, On 7/15 requested 8 additional visits from [facility]. Update: Workers Comp approved 12 additional visits.    Authorization - Visit Number  4    Authorization - Number of Visits  12    OT Start Time  3536    OT Stop Time  1525    OT Time Calculation (min)  40 min    Activity Tolerance  Patient tolerated treatment well    Behavior During Therapy  WFL for tasks assessed/performed       Past Medical History:  Diagnosis Date  . Arthritis   . Asthma   . Chest pain    cath 2005 norm cors, repeat cath Feb 2011 normal cors and only minimally  elevated pulmonary pressures  . Crohn disease ([place])   . Diverticulosis   . GERD (gastroesophageal reflux disease)   . Hiatal hernia   . Hypertension   . Obesity   . Palpitations   . Sleep apnea sleep study 11/03/2009    cpap; does sometimes, doesnt use every night.    Past Surgical History:  Procedure Laterality Date  . ABDOMINAL HYSTERECTOMY    . ABDOMINAL HYSTERECTOMY    . ANKLE ARTHROSCOPY  06/01/2012   Procedure: ANKLE ARTHROSCOPY;  Surgeon: Colin Rhein, MD;  Location: [place];  Service: Orthopedics;  Laterality: Left;  left ankle arthorsocpy with extensive debridement and gastroc slide  . CARDIAC CATHETERIZATION  11/22/2009 and 2005   WNL  . CESAREAN SECTION    . CHOLECYSTECTOMY  09/08/2006   lap. chole.  . CHONDROPLASTY  06/17/2012   Procedure:  CHONDROPLASTY;  Surgeon: Carole Civil, MD;  [facility];  Service: Orthopedics;  Laterality: Right;  right patella  . ESOPHAGOGASTRODUODENOSCOPY N/A 05/14/2015   Procedure: ESOPHAGOGASTRODUODENOSCOPY (EGD);  Surgeon: Rogene Houston, MD;  [facility];  Service: Endoscopy;  Laterality: N/A;  730  . INGUINAL HERNIA REPAIR  10/30/2008   right  . KNEE ARTHROSCOPY WITH LATERAL MENISECTOMY Left 12/23/2016   Procedure: LEFT KNEE ARTHROSCOPY WITH LATERAL MENISECTOMY;  Surgeon: Carole Civil, MD;  [facility];  Service: Orthopedics;  Laterality: Left;  . KNEE ARTHROSCOPY WITH MEDIAL MENISECTOMY Left 12/23/2016   Procedure: LEFT KNEE ARTHROSCOPY WITH MEDIAL MENISECTOMY CHONDROPLASTY PATELLA  AND MEDIAL FEMORAL CONDYLE LEFT KNEE;  Surgeon: Carole Civil, MD;  [facility];  Service: Orthopedics;  Laterality: Left;  . SHOULDER ARTHROSCOPY WITH SUBACROMIAL DECOMPRESSION AND BICEP TENDON REPAIR Left 12/21/2017   Procedure: Left shoulder arthroscopic biceps tenodesis, SAD, DCR and labrum debridement;  Surgeon: Nicholes Stairs, MD;  Location: [place];  Service: Orthopedics;  Laterality: Left;  120 mins  . SHOULDER SURGERY     right x 2     There were no vitals filed for this visit.  Subjective Assessment - 05/05/18 1532    Subjective   S:  The ultrasound seems to  really help the tightness I am feeling in my shoulder.    Currently in Pain?  Yes    Pain Score  3     Pain Location  Shoulder    Pain Orientation  Left    Pain Descriptors / Indicators  Aching    Pain Type  Acute pain         [facility] OT Assessment - 05/05/18 0001      Assessment   Medical Diagnosis  Left shoulder SLAP, debridement, and bicep tenodesis      Precautions   Precautions  Shoulder    Type of Shoulder Precautions  Follow Standard SLAP repair protocol. No lifting over 5#               OT Treatments/Exercises (OP) - 05/05/18 0001      Exercises   Exercises  Shoulder       Shoulder Exercises: Supine   Protraction  PROM;5 reps;Strengthening;15 reps    Protraction Weight (lbs)  2    Horizontal ABduction  PROM;5 reps;Strengthening;15 reps    Horizontal ABduction Weight (lbs)  2    External Rotation  PROM;Strengthening;15 reps    External Rotation Weight (lbs)  2    Internal Rotation  PROM;5 reps;Strengthening;15 reps    Internal Rotation Weight (lbs)  2    Flexion  PROM;5 reps;Strengthening;15 reps    Shoulder Flexion Weight (lbs)  2    ABduction  PROM;5 reps;Strengthening;15 reps    Shoulder ABduction Weight (lbs)  2      Ultrasound   Ultrasound Location  leftshoulder - anterior deltoid     Ultrasound Parameters  1.5 w/cm2 constant 1.5 mhz    Ultrasound Goals  Pain;Other (Comment) restrictions       Manual Therapy   Manual Therapy  Myofascial release    Manual therapy comments  completed seperately from all other skilled interventions    Myofascial Release  myofascial release to left shoulder, trapezius, scapular, and upper arm region to decrease pain and improve pain free mobility.                OT Short Term Goals - 04/26/18 1740      OT SHORT TERM GOAL #1   Title  Patient will be educated and independent with HEP for left shoulder mobility for improved abiilty to return to normal work and daily tasks.     Time  6    Period  Weeks      OT SHORT TERM GOAL #2   Title  patient will improve Left shoulder P/ROM to WNL to increase ability to complete dressing tasks with less difficulty.     Time  6    Period  Weeks      OT SHORT TERM GOAL #3   Title  Patient will increase left shoulder strength to 3+/5 in order to complete lightweight household actiivities with LUE.     Time  6      OT SHORT TERM GOAL #4   Title  Patient will decrease pain level in left shoulder to 5/10 with daily task completion.     Time  6    Period  Weeks      OT SHORT TERM GOAL #5   Title  Patient will decrease fascial restrictions in LUE to mod amount in order  to increase functional mobility needed to complete overhead and reaching tasks.     Time  6    Period  Weeks  OT Long Term Goals - 04/26/18 1740      OT LONG TERM GOAL #1   Title  Patient will return to highest level of independence with her LUE in order to return to work and complete required tasks.     Time  12    Period  Weeks    Status  On-going      OT LONG TERM GOAL #2   Title  patient will increase A/ROM of LUE to WNL to increase ability to complete over head reaching tasks without difficulty.     Time  12    Period  Weeks    Status  On-going      OT LONG TERM GOAL #3   Title  Patient will increase LUE strength to 4+/5 to increase ability to complete chest compressions during CPR when needed.     Time  12    Period  Weeks      OT LONG TERM GOAL #4   Title  Patient will decrease fascial restrictions to min amount or less in order to increase functional mobility needed for overhead and reaching tasks.     Time  12    Period  Weeks    Status  On-going      OT LONG TERM GOAL #5   Title  Patient will report a decreased level of pain of approximately 2/10 when completing work or daily tasks.     Time  12    Period  Weeks    Status  On-going      OT LONG TERM GOAL #6   Title  Patient will increase LUE strength to5/5 to increase ability to complete chest compressions during CPR when needed.     Time  4    Period  Weeks    Status  On-going            Plan - 05/05/18 1533    Clinical Impression Statement  A: Patient continues to have limitations with shoulder flexion, abduction, and external rotation wtih fascial restrictions noted in anterior and posterior shoulder.  Patients mobility improved with myofascial release intervention.  ended session with ultrasound for decreased soreness s/p manual intervention.     Plan  P:  Add red loop theraband for lateral and diagonal wall walks. PVC pipe with body turn for external rotation for improved range needed for adl  completion.     Consulted and Agree with Plan of Care  Patient       Patient will benefit from skilled therapeutic intervention in order to improve the following deficits and impairments:  Pain, Decreased range of motion, Impaired UE functional use, Increased fascial restrictions, Decreased strength, Impaired flexibility  Visit Diagnosis: Other symptoms and signs involving the musculoskeletal system  Acute pain of left shoulder  Stiffness of left shoulder, not elsewhere classified    Problem List Patient Active Problem List   Diagnosis Date Noted  . Derangement of anterior horn of lateral meniscus of left knee   . Derangement of posterior horn of medial meniscus of left knee   . Chest pain 04/25/2016  . Hypokalemia 04/25/2016  . Hyperglycemia 04/25/2016  . Diverticulitis of colon 07/17/2014  . Nausea without vomiting 07/17/2014  . Crohn disease ([place]) 07/17/2014  . Diverticulitis of colon without hemorrhage 08/10/2013  . History of arthroscopy of right knee 07/25/2012  . Difficulty in walking(719.7) 06/29/2012  . Weakness of right leg 06/29/2012  . Posterior tibial tendonitis 11/11/2011  . PTTD (posterior tibial tendon  dysfunction) 11/11/2011  . Knee pain, right 11/11/2011  . Chest pain 07/16/2011  . Primary osteoarthritis of left knee 02/25/2010  . PAIN IN JOINT, MULTIPLE SITES 02/25/2010  . Essential hypertension 08/13/2009  . HIATAL HERNIA 08/13/2009  . PALPITATIONS 08/13/2009  . DYSPNEA 08/13/2009    Vangie Bicker, [place], OTR/L [phone]  05/05/2018, 3:42 PM  [facility] [address] [place], [place], [address] Phone: [phone]   Fax:  [phone]  Name: OTHEL DICOSTANZO MRN: [id number] Date of Birth: 1966/09/09

## 2018-05-09 ENCOUNTER — Encounter (HOSPITAL_COMMUNITY): Payer: Self-pay

## 2018-05-10 ENCOUNTER — Telehealth (HOSPITAL_COMMUNITY): Payer: Self-pay | Admitting: Family Medicine

## 2018-05-10 ENCOUNTER — Ambulatory Visit (HOSPITAL_COMMUNITY): Payer: PRIVATE HEALTH INSURANCE | Admitting: Occupational Therapy

## 2018-05-10 NOTE — Telephone Encounter (Signed)
05/10/18  pt called - thought that last week was her last appt... she is having surgery tomorrow and has an appt today in [place] and won't be here at this appt

## 2018-05-11 HISTORY — PX: ANKLE SURGERY: SHX546

## 2018-05-12 MED FILL — GABAPENTIN 300 MG CAPSULE: 300 | 30 days supply | Qty: 30 | Fill #3

## 2018-05-17 ENCOUNTER — Ambulatory Visit: Payer: 59 | Admitting: Orthopedic Surgery

## 2018-05-19 MED FILL — CYCLOBENZAPRINE 10 MG TAB: 10 | 15 days supply | Qty: 45 | Fill #0

## 2018-05-27 MED FILL — oxyCODONE HCL 5 MG TABS: 5 | 5 days supply | Qty: 15 | Fill #0

## 2018-05-31 MED FILL — DICLOFENAC SODIUM 1% GEL: 1 | 18 days supply | Qty: 300 | Fill #1

## 2018-06-28 ENCOUNTER — Encounter (INDEPENDENT_AMBULATORY_CARE_PROVIDER_SITE_OTHER): Payer: Self-pay | Admitting: Internal Medicine

## 2018-06-28 ENCOUNTER — Ambulatory Visit (INDEPENDENT_AMBULATORY_CARE_PROVIDER_SITE_OTHER): Payer: 59 | Admitting: Internal Medicine

## 2018-06-28 VITALS — BP 158/90 | HR 72 | Temp 97.9°F | Ht 72.5 in | Wt 228.0 lb

## 2018-06-28 DIAGNOSIS — K5010 Crohn's disease of large intestine without complications: Secondary | ICD-10-CM | POA: Diagnosis not present

## 2018-06-28 NOTE — Patient Instructions (Signed)
Labs today. OV in 1 year.

## 2018-06-28 NOTE — Progress Notes (Signed)
Subjective:    Patient ID: [name], female    DOB: 01/24/1966, 52 y.o.   MRN: [id number]  HPI Here today for f/u. Last seen in March of 89381 Had an injury in the ED in October of last year and has a boot in place. Shoulder surgery 12/21/2017. She tells me she is doing well as far as her Crohn's disease. She occasionally has constipation. Appetite is good. She is trying to lose weight. In a w/c from her recent surgery. Has a BM every other day. No melena or BRRB.  Hx of Crohn's disease but is not maintained on any Crohn's medication.  Review of Systems Past Medical History:  Diagnosis Date  . Arthritis   . Asthma   . Chest pain    cath 2005 norm cors, repeat cath Feb 2011 normal cors and only minimally  elevated pulmonary pressures  . Crohn disease ([place])   . Diverticulosis   . GERD (gastroesophageal reflux disease)   . Hiatal hernia   . Hypertension   . Obesity   . Palpitations   . Sleep apnea sleep study 11/03/2009    cpap; does sometimes, doesnt use every night.    Past Surgical History:  Procedure Laterality Date  . ABDOMINAL HYSTERECTOMY    . ABDOMINAL HYSTERECTOMY    . ANKLE ARTHROSCOPY  06/01/2012   Procedure: ANKLE ARTHROSCOPY;  Surgeon: Colin Rhein, MD;  Location: [facility];  Service: Orthopedics;  Laterality: Left;  left ankle arthorsocpy with extensive debridement and gastroc slide  . CARDIAC CATHETERIZATION  11/22/2009 and 2005   WNL  . CESAREAN SECTION    . CHOLECYSTECTOMY  09/08/2006   lap. chole.  . CHONDROPLASTY  06/17/2012   Procedure: CHONDROPLASTY;  Surgeon: Carole Civil, MD;  [facility];  Service: Orthopedics;  Laterality: Right;  right patella  . ESOPHAGOGASTRODUODENOSCOPY N/A 05/14/2015   Procedure: ESOPHAGOGASTRODUODENOSCOPY (EGD);  Surgeon: Rogene Houston, MD;  [facility];  Service: Endoscopy;  Laterality: N/A;  730  . INGUINAL HERNIA REPAIR  10/30/2008   right  . KNEE ARTHROSCOPY WITH LATERAL  MENISECTOMY Left 12/23/2016   Procedure: LEFT KNEE ARTHROSCOPY WITH LATERAL MENISECTOMY;  Surgeon: Carole Civil, MD;  [facility];  Service: Orthopedics;  Laterality: Left;  . KNEE ARTHROSCOPY WITH MEDIAL MENISECTOMY Left 12/23/2016   Procedure: LEFT KNEE ARTHROSCOPY WITH MEDIAL MENISECTOMY CHONDROPLASTY PATELLA  AND MEDIAL FEMORAL CONDYLE LEFT KNEE;  Surgeon: Carole Civil, MD;  Location[facility];  Service: Orthopedics;  Laterality: Left;  . SHOULDER ARTHROSCOPY WITH SUBACROMIAL DECOMPRESSION AND BICEP TENDON REPAIR Left 12/21/2017   Procedure: Left shoulder arthroscopic biceps tenodesis, SAD, DCR and labrum debridement;  Surgeon: Nicholes Stairs, MD;  Location: [facility];  Service: Orthopedics;  Laterality: Left;  120 mins  . SHOULDER SURGERY     right x 2     Allergies  Allergen Reactions  . Iodinated Diagnostic Agents Anaphylaxis and Hives  . Other Shortness Of Breath    Lemon grass  . Wasp Venom Anaphylaxis and Shortness Of Breath  . Pollen Extract Swelling  . Adhesive [Tape] Rash  . Hydromorphone Hcl Nausea And Vomiting  . Omnipaque [Iohexol] Hives and Nausea Only    Pt. Was premedicated with emergent protocol.    Current Outpatient Medications on File Prior to Visit  Medication Sig Dispense Refill  . albuterol (PROVENTIL) (2.5 MG/3ML) 0.083% nebulizer solution Take 2.5 mg by nebulization every 6 (six) hours as needed. For shortness of  breath    . albuterol (VENTOLIN HFA) 108 (90 Base) MCG/ACT inhaler Inhale 1-2 puffs into the lungs every 6 (six) hours as needed for wheezing or shortness of breath.    Marland Kitchen amLODipine (NORVASC) 5 MG tablet Take 5 mg by mouth at bedtime.    . budesonide-formoterol (SYMBICORT) 160-4.5 MCG/ACT inhaler Inhale 2 puffs into the lungs 2 (two) times daily.    . cetirizine (ZYRTEC) 10 MG tablet Take 10 mg by mouth daily.    . cyclobenzaprine (FLEXERIL) 10 MG tablet TAKE ONE TABLET BY MOUTH 3 TIMES DAILY AS NEEDED FOR MUSCLE SPASM(S). 40 tablet 0    . diclofenac sodium (VOLTAREN) 1 % GEL Apply 4 g topically 4 (four) times daily. 3 Tube 5  . EPINEPHrine (EPIPEN) 0.3 mg/0.3 mL SOAJ injection Inject 0.3 mLs (0.3 mg total) into the muscle once. 1 Device 0  . famotidine (PEPCID) 20 MG tablet Take 20 mg by mouth as needed for heartburn or indigestion.    . fluticasone (FLONASE) 50 MCG/ACT nasal spray Place 1 spray into both nostrils daily.    Marland Kitchen gabapentin (NEURONTIN) 300 MG capsule TAKE 1 CAPSULE BY MOUTH ONCE DAILY AT BEDTIME 30 capsule 4  . ibuprofen (ADVIL,MOTRIN) 800 MG tablet Take 1 tablet (800 mg total) by mouth every 8 (eight) hours as needed. 90 tablet 1  . losartan (COZAAR) 100 MG tablet Take 100 mg by mouth at bedtime.     . Magnesium 500 MG CAPS Take by mouth.    . meclizine (ANTIVERT) 25 MG tablet Take 25 mg by mouth 3 (three) times daily as needed for dizziness.    . meloxicam (MOBIC) 7.5 MG tablet Take 7.5 mg by mouth 2 (two) times daily.    . montelukast (SINGULAIR) 10 MG tablet Take 10 mg by mouth at bedtime.    . ondansetron (ZOFRAN) 4 MG tablet Take 4 mg by mouth every 8 (eight) hours as needed for nausea or vomiting.    Marland Kitchen OVER THE COUNTER MEDICATION K2 and D3 daily    . pantoprazole (PROTONIX) 40 MG tablet TAKE (1) TABLET BY MOUTH TWICE DAILY BEFORE A MEAL. (Patient taking differently: TAKE (1) TABLET BY MOUTH TWICE DAILY as needed) 60 tablet 1   No current facility-administered medications on file prior to visit.         Objective:   Physical Exam Blood pressure (!) 158/90, pulse 72, temperature 97.9 F (36.6 C), height 6' 0.5" (1.842 m), weight 228 lb (103.4 kg). Alert and oriented. Skin warm and dry. Oral mucosa is moist.   . Sclera anicteric, conjunctivae is pink. Thyroid not enlarged. No cervical lymphadenopathy. Lungs clear. Heart regular rate and rhythm.  Abdomen is soft. Bowel sounds are positive. No hepatomegaly. No abdominal masses felt. No tenderness.  No edema to lower extremities.    Weight is stated. She is  unable to stand due to her left ankle injury.       Assessment & Plan:  Crohn's disease. She is doing well.  Will get a CBC and CRP today. OV in 1 year.

## 2018-06-29 LAB — CBC WITH DIFFERENTIAL/PLATELET
BASOS ABS: 28 {cells}/uL (ref 0–200)
Basophils Relative: 0.7 %
EOS PCT: 4.5 %
Eosinophils Absolute: 180 cells/uL (ref 15–500)
HCT: 35.5 % (ref 35.0–45.0)
Hemoglobin: 11.8 g/dL (ref 11.7–15.5)
Lymphs Abs: 1344 cells/uL (ref 850–3900)
MCH: 28.1 pg (ref 27.0–33.0)
MCHC: 33.2 g/dL (ref 32.0–36.0)
MCV: 84.5 fL (ref 80.0–100.0)
MPV: 10.6 fL (ref 7.5–12.5)
Monocytes Relative: 9.2 %
NEUTROS PCT: 52 %
Neutro Abs: 2080 cells/uL (ref 1500–7800)
PLATELETS: 362 10*3/uL (ref 140–400)
RBC: 4.2 10*6/uL (ref 3.80–5.10)
RDW: 13.4 % (ref 11.0–15.0)
TOTAL LYMPHOCYTE: 33.6 %
WBC mixed population: 368 cells/uL (ref 200–950)
WBC: 4 10*3/uL (ref 3.8–10.8)

## 2018-06-29 LAB — C-REACTIVE PROTEIN: CRP: 4.6 mg/L (ref <8.0)

## 2018-07-01 ENCOUNTER — Other Ambulatory Visit: Payer: Self-pay

## 2018-07-01 ENCOUNTER — Encounter (HOSPITAL_COMMUNITY): Payer: Self-pay | Admitting: Occupational Therapy

## 2018-07-01 ENCOUNTER — Ambulatory Visit (HOSPITAL_COMMUNITY): Payer: 59 | Attending: Family Medicine | Admitting: Occupational Therapy

## 2018-07-01 DIAGNOSIS — R29898 Other symptoms and signs involving the musculoskeletal system: Secondary | ICD-10-CM | POA: Insufficient documentation

## 2018-07-01 DIAGNOSIS — M25612 Stiffness of left shoulder, not elsewhere classified: Secondary | ICD-10-CM | POA: Diagnosis not present

## 2018-07-01 DIAGNOSIS — M25512 Pain in left shoulder: Secondary | ICD-10-CM | POA: Insufficient documentation

## 2018-07-01 NOTE — Patient Instructions (Signed)
  1) Flexion Wall Stretch    Face wall, place affected handon wall in front of you. Slide hand up the wall  and lean body in towards the wall. Hold for 10 seconds. Repeat 3-5 times. 1-2 times/day.     2) Towel Stretch with Internal Rotation   Or     Gently pull up (or to the side) your affected arm  behind your back with the assist of a towel. Hold 10 seconds, repeat 3-5 times. 1-2 times/day.             3) Corner Stretch    Stand at a corner of a wall, place your arms on the walls with elbows bent. Lean into the corner until a stretch is felt along the front of your chest and/or shoulders. Hold for 10 seconds. Repeat 3-5X, 1-2 times/day.    4) Posterior Capsule Stretch    Bring the involved arm across chest. Grasp elbow and pull toward chest until you feel a stretch in the back of the upper arm and shoulder. Hold 10 seconds. Repeat 3-5X. Complete 1-2 times/day.     5) External Rotation Stretch:      Standing in an open doorway, place your arm on the edge of the doorway with the shoulder at 90 degrees from the side and elbow bent to 90 degrees. Lean forward until you feel a stretch on the front of your shoulder. Keep neck relaxed. Hold 10 seconds, repeat 3-5X. Complete 1-2 times/day

## 2018-07-01 NOTE — Therapy (Signed)
[facility] [place], [place], [address] Phone: [phone]   Fax:  [phone]  Patient Details  Name: [name] MRN: [id number] Date of Birth: 27-Jul-1966 Referring Provider:  No ref. provider found  Encounter Date: 07/01/2018   OCCUPATIONAL THERAPY DISCHARGE SUMMARY  Visits from Start of Care: 28  Current functional level related to goals / functional outcomes: Pt discharged on 05/05/18 due to upcoming ankle sx. See measurements taken from 05/04/18 below. Pt made good progress with ROM, strength, and functional use of LUE during ADLs during therapy.    Remaining deficits: Continued ROM limitations, strength limitations, and pain with certain movements   Education / Equipment: HEP for ROM and strengthening Plan: Patient agrees to discharge.  Patient goals were partially met. Patient is being discharged due to meeting the stated rehab goals.  ?????       [facility] OT Assessment - 05/03/18 1435            Assessment   Medical Diagnosis  Left shoulder SLAP, debridement, and bicep tenodesis        Precautions   Precautions  Shoulder    Type of Shoulder Precautions  Follow Standard SLAP repair protocol. No lifting over 5#        Observation/Other Assessments   Focus on Therapeutic Outcomes (FOTO)   53/100        AROM   Overall AROM Comments  Assessed seated. IR/er adducted    Left Shoulder Flexion  148 Degrees 145 previous    Left Shoulder ABduction  165 Degrees 120 previous    Left Shoulder Internal Rotation  90 Degrees same as previous    Left Shoulder External Rotation  60 Degrees same as previous        PROM   Overall PROM Comments  Assessed supine. IR/er adducted    Left Shoulder Flexion  152 Degrees 130 previous    Left Shoulder ABduction  180 Degrees same as previous    Left Shoulder Internal Rotation  90 Degrees same as previous    Left Shoulder External Rotation  70 Degrees 64  previous        Strength   Overall Strength Comments  assessed seated. IR/er adducted    Left Shoulder Flexion  4+/5 same as previous    Left Shoulder ABduction  4-/5 same as previous    Left Shoulder Internal Rotation  4+/5 same as previous    Left Shoulder External Rotation  4+/5 same as previous      [name], OTR/L  [id number] 07/01/2018, 12:18 PM  [facility] [address] [place], [place], [address] Phone: [phone]   Fax:  [phone]

## 2018-07-01 NOTE — Therapy (Addendum)
[facility] [address] [place], [place], [address] Phone: [phone]   Fax:  [phone]  Occupational Therapy Evaluation  Patient Details  Name: [name] MRN: [id number] Date of Birth: [birth year] Referring Provider: Victorino December, MD Encounter Date: 07/01/2018  OT End of Session - 07/01/18 1406    Visit Number  1    Number of Visits  8    Date for OT Re-Evaluation  07/29/18    Authorization Type  Workers compensation approved 8 additional visits for her shoulder.     OT Start Time  1304    OT Stop Time  1349    OT Time Calculation (min)  45 min    Activity Tolerance  Patient tolerated treatment well    Behavior During Therapy  WFL for tasks assessed/performed       Past Medical History:  Diagnosis Date  . Arthritis   . Asthma   . Chest pain    cath 2005 norm cors, repeat cath Feb 2011 normal cors and only minimally  elevated pulmonary pressures  . Crohn disease ([place])   . Diverticulosis   . GERD (gastroesophageal reflux disease)   . Hiatal hernia   . Hypertension   . Obesity   . Palpitations   . Sleep apnea sleep study 11/03/2009    cpap; does sometimes, doesnt use every night.    Past Surgical History:  Procedure Laterality Date  . ABDOMINAL HYSTERECTOMY    . ABDOMINAL HYSTERECTOMY    . ANKLE ARTHROSCOPY  06/01/2012   Procedure: ANKLE ARTHROSCOPY;  Surgeon: Colin Rhein, MD;  Location: [facility];  Service: Orthopedics;  Laterality: Left;  left ankle arthorsocpy with extensive debridement and gastroc slide  . CARDIAC CATHETERIZATION  11/22/2009 and 2005   WNL  . CESAREAN SECTION    . CHOLECYSTECTOMY  09/08/2006   lap. chole.  . CHONDROPLASTY  06/17/2012   Procedure: CHONDROPLASTY;  Surgeon: Carole Civil, MD;  [facility];  Service: Orthopedics;  Laterality: Right;  right patella  . ESOPHAGOGASTRODUODENOSCOPY N/A 05/14/2015   Procedure: ESOPHAGOGASTRODUODENOSCOPY (EGD);  Surgeon: Rogene Houston, MD;  [facility];  Service: Endoscopy;  Laterality: N/A;  730  . INGUINAL HERNIA REPAIR  10/30/2008   right  . KNEE ARTHROSCOPY WITH LATERAL MENISECTOMY Left 12/23/2016   Procedure: LEFT KNEE ARTHROSCOPY WITH LATERAL MENISECTOMY;  Surgeon: Carole Civil, MD;  [facility];  Service: Orthopedics;  Laterality: Left;  . KNEE ARTHROSCOPY WITH MEDIAL MENISECTOMY Left 12/23/2016   Procedure: LEFT KNEE ARTHROSCOPY WITH MEDIAL MENISECTOMY CHONDROPLASTY PATELLA  AND MEDIAL FEMORAL CONDYLE LEFT KNEE;  Surgeon: Carole Civil, MD;  [facility];  Service: Orthopedics;  Laterality: Left;  . SHOULDER ARTHROSCOPY WITH SUBACROMIAL DECOMPRESSION AND BICEP TENDON REPAIR Left 12/21/2017   Procedure: Left shoulder arthroscopic biceps tenodesis, SAD, DCR and labrum debridement;  Surgeon: Nicholes Stairs, MD;  Location: [facility];  Service: Orthopedics;  Laterality: Left;  120 mins  . SHOULDER SURGERY     right x 2     There were no vitals filed for this visit.  Subjective Assessment - 07/01/18 1309    Subjective   S: My shoulder locks up on me with certain motions.     Pertinent History  Pt was injured during a fall in 2018 and due to injuries received left shoulder SLAP in March 2019. She began OT treatment at this clinic from April until August 2019. Pt discontinued treatment  due to knee surgery. She has now been referred by Dr. Victorino December for arthritis of left acromioclavicular joint.    Special Tests  FOTO: 50/100    Patient Stated Goals  Be able to use LUE without pain.     Currently in Pain?  Yes    Pain Score  4     Pain Location  Shoulder    Pain Orientation  Left    Pain Descriptors / Indicators  Aching    Pain Type  Acute pain    Pain Radiating Towards  N/A    Pain Onset  More than a month ago    Pain Frequency  Intermittent    Aggravating Factors   Use of UE    Pain Relieving Factors  Medication    Effect of Pain on Daily Activities  Mod effect on daily  activities    Multiple Pain Sites  No        [facility] OT Assessment - 07/01/18 1304      Assessment   Medical Diagnosis  Arthritis of left acromioclavicular joint     Onset Date/Surgical Date  --    Hand Dominance  Right    Next MD Visit  07/26/18    Prior Therapy  OT treatment for left shoulder SLAP debridement, and bicep tenodesis April-August 2019      Precautions   Precautions  Shoulder    Type of Shoulder Precautions  No lifting over 5#      Restrictions   Weight Bearing Restrictions  --    Other Position/Activity Restrictions  --      Balance Screen   Has the patient fallen in the past 6 months  No    Has the patient had a decrease in activity level because of a fear of falling?   No      Home  Environment   Family/patient expects to be discharged to:  Private residence    Lives With  Family      Prior Function   Level of Independence  Independent    Vocation  Full time employment    Cytogeneticist, walking, pushing cart    Leisure  Spending time with family      ADL   ADL comments  Reaching overhead is painful and reaching behind her is painful, such as when she donnes bra      Written Expression   Dominant Hand  Right      Vision - History   Baseline Vision  Wears glasses for distance only      Observation/Other Assessments   Focus on Therapeutic Outcomes (FOTO)   50/100      ROM / Strength   AROM / PROM / Strength  AROM;PROM;Strength      Palpation   Palpation comment  max fascial restrictions in left bicep, upper arm, trapezius, and scapularis region.       AROM   Overall AROM   Deficits    Overall AROM Comments  Assessed seated. IR/er adducted    AROM Assessment Site  Shoulder    Right/Left Shoulder  Left    Left Shoulder Flexion  135 Degrees    Left Shoulder ABduction  110 Degrees    Left Shoulder Internal Rotation  90 Degrees    Left Shoulder External Rotation  85 Degrees      PROM   Overall PROM   Deficits    Overall PROM  Comments  Assessed supine. IR/er adducted  PROM Assessment Site  Shoulder    Right/Left Shoulder  Left    Left Shoulder Flexion  150 Degrees    Left Shoulder ABduction  135 Degrees    Left Shoulder Internal Rotation  90 Degrees    Left Shoulder External Rotation  90 Degrees      Strength   Overall Strength  Deficits    Overall Strength Comments  assessed seated. IR/er adducted    Strength Assessment Site  Shoulder    Right/Left Shoulder  Left    Left Shoulder Flexion  4+/5    Left Shoulder ABduction  4/5    Left Shoulder Internal Rotation  4+/5    Left Shoulder External Rotation  4+/5                      OT Education - 07/01/18 1357    Education provided  Yes    Education Details  Educated on stretches to perform: wall stretch-flexion, corner stretch, er, and pectoralis stretch, 20" per hold    Person(s) Educated  Patient    Methods  Explanation;Handout    Comprehension  Verbalized understanding       OT Short Term Goals - 07/01/18 1415      OT SHORT TERM GOAL #1   Title  Patient will decrease fascial restrictions in LUE to moderate or less in order to perform overhead reaching.     Time  4    Period  Weeks    Status  New    Target Date  07/29/18      OT SHORT TERM GOAL #2   Title  Pt will increase left shoulder strength to 5/5 for more functional use of UE in order to perform work tasks.     Time  4    Period  Weeks    Status  New      OT SHORT TERM GOAL #3   Title  Pt will decrease pain level in left shoulder to 2/10 in order to participate in daily grooming without pain.     Time  4    Period  Weeks    Status  New      OT SHORT TERM GOAL #4   Title  Pt will improve Left shoulder A/ROM to within normal limits to improve ease when reaching behind her in the car.      Time  4    Period  Weeks    Status  New      OT SHORT TERM GOAL #5   Title  Pt will become independent with HEP to improve ability to return to light housekeeping.     Time  4     Period  Weeks    Status  New             Plan - 07/01/18 1412    Clinical Impression Statement  A: Pt discharged from OT on 05/05/18 due to knee surgery. She has now returned to resume treatment for her left shoulder deficits in ROM, strength, pain, and fascial restrictions that impairs use of her LUE. She reports that she experiences mild to moderate pain when lifting her arm against gravity. Pt reports that her limited shoulder ROM and pain makes it difficult to reach behind her back during daily tasks, such as fastening her bra.       Occupational Profile and client history currently impacting functional performance  Patient is a 52 year old female that was injured during  a fall at work in 2018.  In March 2019, she received left shoulder SLAP. Pt is motivated to return to highest level of functioning.    Occupational performance deficits (Please refer to evaluation for details):  ADL's;IADL's;Work;Leisure    Rehab Potential  Good    OT Frequency  2x / week    OT Duration  4 weeks    OT Treatment/Interventions  Self-care/ADL training;Therapeutic exercise;Ultrasound;Manual Therapy;Therapeutic activities;Energy conservation;Cryotherapy;Paraffin;DME and/or AE instruction;Electrical Stimulation;Moist Heat;Contrast Bath;Passive range of motion;Patient/family education    Plan  P: Pt will benefit from OT services to lessen fascial restrictions, increase ROM, decrease pain and to improve strength for improved functional use of left arm for work tasks. Session Plan: Myofascial release, P/ROM, and A/ROM progress as tolerated and general LUE strengthening.       OT Home Exercise Plan  Provided with HEP on shoulder stretches    Consulted and Agree with Plan of Care  Patient       Patient will benefit from skilled therapeutic intervention in order to improve the following deficits and impairments:  Pain, Decreased range of motion, Impaired UE functional use, Increased fascial restrictions, Decreased  strength, Impaired flexibility  Visit Diagnosis: Acute pain of left shoulder  Stiffness of left shoulder, not elsewhere classified  Other symptoms and signs involving the musculoskeletal system    Problem List Patient Active Problem List   Diagnosis Date Noted  . Derangement of anterior horn of lateral meniscus of left knee   . Derangement of posterior horn of medial meniscus of left knee   . Chest pain 04/25/2016  . Hypokalemia 04/25/2016  . Hyperglycemia 04/25/2016  . Diverticulitis of colon 07/17/2014  . Nausea without vomiting 07/17/2014  . Crohn disease ([place]) 07/17/2014  . Diverticulitis of colon without hemorrhage 08/10/2013  . History of arthroscopy of right knee 07/25/2012  . Difficulty in walking(719.7) 06/29/2012  . Weakness of right leg 06/29/2012  . Posterior tibial tendonitis 11/11/2011  . PTTD (posterior tibial tendon dysfunction) 11/11/2011  . Knee pain, right 11/11/2011  . Chest pain 07/16/2011  . Primary osteoarthritis of left knee 02/25/2010  . PAIN IN JOINT, MULTIPLE SITES 02/25/2010  . Essential hypertension 08/13/2009  . HIATAL HERNIA 08/13/2009  . PALPITATIONS 08/13/2009  . DYSPNEA 08/13/2009    Toney Rakes, OT Student  07/01/2018, 4:24 PM  [facility] [place], [place], [address] Phone: [phone]   Fax:  [phone]  Name: MANAHIL VANZILE MRN: [id number] Date of Birth: 09/22/1966

## 2018-07-06 ENCOUNTER — Telehealth (HOSPITAL_COMMUNITY): Payer: Self-pay | Admitting: Family Medicine

## 2018-07-06 NOTE — Telephone Encounter (Signed)
07/06/18  pt left a message that she had another appt on 10/3 and would need to cancel the therapy appt

## 2018-07-07 ENCOUNTER — Encounter (HOSPITAL_COMMUNITY): Payer: Self-pay

## 2018-07-07 MED FILL — GABAPENTIN 300 MG CAPSULE: 300 | 30 days supply | Qty: 30 | Fill #4

## 2018-07-07 MED FILL — AMLODIPINE BESYLATE 5 MG TA: 5 | 90 days supply | Qty: 90 | Fill #1

## 2018-07-07 MED FILL — LOSARTAN POTASSIUM 100 MG T: 100 | 90 days supply | Qty: 90 | Fill #1

## 2018-07-08 ENCOUNTER — Ambulatory Visit (HOSPITAL_COMMUNITY): Payer: PRIVATE HEALTH INSURANCE | Attending: Family Medicine | Admitting: Occupational Therapy

## 2018-07-08 ENCOUNTER — Encounter (HOSPITAL_COMMUNITY): Payer: Self-pay | Admitting: Occupational Therapy

## 2018-07-08 DIAGNOSIS — R29898 Other symptoms and signs involving the musculoskeletal system: Secondary | ICD-10-CM | POA: Insufficient documentation

## 2018-07-08 DIAGNOSIS — M25512 Pain in left shoulder: Secondary | ICD-10-CM | POA: Diagnosis not present

## 2018-07-08 DIAGNOSIS — M25612 Stiffness of left shoulder, not elsewhere classified: Secondary | ICD-10-CM | POA: Insufficient documentation

## 2018-07-08 DIAGNOSIS — M6281 Muscle weakness (generalized): Secondary | ICD-10-CM | POA: Insufficient documentation

## 2018-07-08 NOTE — Therapy (Addendum)
[facility] [place], [place], [address] Phone: [phone]   Fax:  [phone]  Occupational Therapy Treatment  Patient Details  Name: SIMONE RODENBECK MRN: [id number] Date of Birth: [birth year] Referring Provider (OT): Sharilyn Sites, MD   Encounter Date: 07/08/2018  OT End of Session - 07/08/18 1122    Visit Number  2    Number of Visits  8    Date for OT Re-Evaluation  07/29/18    Authorization Type  Workers compensation approved 8 additional visits for her shoulder.     OT Start Time  1036    OT Stop Time  1117    OT Time Calculation (min)  41 min    Activity Tolerance  Patient tolerated treatment well    Behavior During Therapy  WFL for tasks assessed/performed       Past Medical History:  Diagnosis Date  . Arthritis   . Asthma   . Chest pain    cath 2005 norm cors, repeat cath Feb 2011 normal cors and only minimally  elevated pulmonary pressures  . Crohn disease ([place])   . Diverticulosis   . GERD (gastroesophageal reflux disease)   . Hiatal hernia   . Hypertension   . Obesity   . Palpitations   . Sleep apnea sleep study 11/03/2009    cpap; does sometimes, doesnt use every night.    Past Surgical History:  Procedure Laterality Date  . ABDOMINAL HYSTERECTOMY    . ABDOMINAL HYSTERECTOMY    . ANKLE ARTHROSCOPY  06/01/2012   Procedure: ANKLE ARTHROSCOPY;  Surgeon: Colin Rhein, MD;  Location: [facility];  Service: Orthopedics;  Laterality: Left;  left ankle arthorsocpy with extensive debridement and gastroc slide  . CARDIAC CATHETERIZATION  11/22/2009 and 2005   WNL  . CESAREAN SECTION    . CHOLECYSTECTOMY  09/08/2006   lap. chole.  . CHONDROPLASTY  06/17/2012   Procedure: CHONDROPLASTY;  Surgeon: Carole Civil, MD;  [facility];  Service: Orthopedics;  Laterality: Right;  right patella  . ESOPHAGOGASTRODUODENOSCOPY N/A 05/14/2015   Procedure: ESOPHAGOGASTRODUODENOSCOPY (EGD);  Surgeon:  Rogene Houston, MD;  [facility];  Service: Endoscopy;  Laterality: N/A;  730  . INGUINAL HERNIA REPAIR  10/30/2008   right  . KNEE ARTHROSCOPY WITH LATERAL MENISECTOMY Left 12/23/2016   Procedure: LEFT KNEE ARTHROSCOPY WITH LATERAL MENISECTOMY;  Surgeon: Carole Civil, MD;  [facility];  Service: Orthopedics;  Laterality: Left;  . KNEE ARTHROSCOPY WITH MEDIAL MENISECTOMY Left 12/23/2016   Procedure: LEFT KNEE ARTHROSCOPY WITH MEDIAL MENISECTOMY CHONDROPLASTY PATELLA  AND MEDIAL FEMORAL CONDYLE LEFT KNEE;  Surgeon: Carole Civil, MD;  [facility];  Service: Orthopedics;  Laterality: Left;  . SHOULDER ARTHROSCOPY WITH SUBACROMIAL DECOMPRESSION AND BICEP TENDON REPAIR Left 12/21/2017   Procedure: Left shoulder arthroscopic biceps tenodesis, SAD, DCR and labrum debridement;  Surgeon: Nicholes Stairs, MD;  Location: [facility];  Service: Orthopedics;  Laterality: Left;  120 mins  . SHOULDER SURGERY     right x 2     There were no vitals filed for this visit.  Subjective Assessment - 07/08/18 1049    Subjective   S: My crutches bother my shoulder.                 Currently in Pain?  Yes    Pain Score  4     Pain Location  Shoulder    Pain Orientation  Left    Pain Descriptors / Indicators  Aching    Pain Type  Acute pain    Pain Radiating Towards  N/A    Pain Onset  More than a month ago    Pain Frequency  Constant    Aggravating Factors   Movement, use of arm    Pain Relieving Factors  Tylenol    Effect of Pain on Daily Activities  Mod effect on daily activities.    Multiple Pain Sites  No         [facility] OT Assessment - 07/08/18 1039      Assessment   Medical Diagnosis  Arthritis of left acromioclavicular joint     Referring Provider (OT)  Sharilyn Sites, MD      Precautions   Precautions  Shoulder    Type of Shoulder Precautions  No lifting over 5#               OT Treatments/Exercises (OP) - 07/08/18 1040      Exercises   Exercises   Shoulder      Shoulder Exercises: Supine   Protraction  PROM;5 reps;Strengthening;15 reps    Protraction Weight (lbs)  1    Horizontal ABduction  PROM;5 reps;Strengthening;15 reps    Horizontal ABduction Weight (lbs)  1    External Rotation  PROM;Strengthening;15 reps    External Rotation Weight (lbs)  1    Internal Rotation  PROM;5 reps;Strengthening;15 reps    Internal Rotation Weight (lbs)  1    Flexion  PROM;5 reps;Strengthening;15 reps    Shoulder Flexion Weight (lbs)  1    ABduction  PROM;5 reps;Strengthening;15 reps    Shoulder ABduction Weight (lbs)  1      Shoulder Exercises: ROM/Strengthening   Ball on Wall  seated 1' flexion, 1' abduction    Other ROM/Strengthening Exercises  3# straight bar supine protraction 15X    Other ROM/Strengthening Exercises  3# straight bar seated flexion 15X      Shoulder Exercises: Stretch   Other Shoulder Stretches  3# straight bar sustained stretch in supine flexion for1'               OT Short Term Goals - 07/08/18 1417      OT SHORT TERM GOAL #1   Title  Patient will decrease fascial restrictions in LUE to moderate or less in order to perform overhead reaching.     Time  4    Period  Weeks    Status  On-going      OT SHORT TERM GOAL #2   Title  Pt will increase left shoulder strength to 5/5 for more functional use of UE in order to perform work tasks.     Time  4    Period  Weeks    Status  On-going      OT SHORT TERM GOAL #3   Title  Pt will decrease pain level in left shoulder to 2/10 in order to participate in daily grooming without pain.     Time  4    Period  Weeks    Status  On-going      OT SHORT TERM GOAL #4   Title  Pt will improve Left shoulder A/ROM to within normal limits to improve ease when reaching behind her in the car.      Time  4    Period  Weeks    Status  On-going      OT SHORT TERM GOAL #5  Title  Pt will become independent with HEP to improve ability to return to light housekeeping.      Time  4    Period  Weeks    Status  On-going               Plan - 07/08/18 1052    Clinical Impression Statement  A: Did not complete manual therapy this session because pt reported that she needed to leave early today. Introduced supine A/ROM with 1# weight to progress shoulder strength. Added 3# straight weight with sustained stretch. Min fatigue noted during exercises with verbal cues provided for form.     OT Frequency  2x / week    OT Duration  4 weeks    Plan  P: Continue A/ROM shoulder strengthening in seated, progressing to 2#. Add proximal shoulder strengthening seated. Complete pec release if needed to improve ROM and add pec stretches.      Consulted and Agree with Plan of Care  Patient       Patient will benefit from skilled therapeutic intervention in order to improve the following deficits and impairments:  Decreased activity tolerance, Decreased strength, Impaired flexibility, Decreased range of motion, Pain, Decreased coordination, Increased fascial restrictions, Impaired UE functional use  Visit Diagnosis: Acute pain of left shoulder  Stiffness of left shoulder, not elsewhere classified  Other symptoms and signs involving the musculoskeletal system  Muscle weakness (generalized)    Problem List Patient Active Problem List   Diagnosis Date Noted  . Derangement of anterior horn of lateral meniscus of left knee   . Derangement of posterior horn of medial meniscus of left knee   . Chest pain 04/25/2016  . Hypokalemia 04/25/2016  . Hyperglycemia 04/25/2016  . Diverticulitis of colon 07/17/2014  . Nausea without vomiting 07/17/2014  . Crohn disease ([place]) 07/17/2014  . Diverticulitis of colon without hemorrhage 08/10/2013  . History of arthroscopy of right knee 07/25/2012  . Difficulty in walking(719.7) 06/29/2012  . Weakness of right leg 06/29/2012  . Posterior tibial tendonitis 11/11/2011  . PTTD (posterior tibial tendon dysfunction) 11/11/2011  . Knee  pain, right 11/11/2011  . Chest pain 07/16/2011  . Primary osteoarthritis of left knee 02/25/2010  . PAIN IN JOINT, MULTIPLE SITES 02/25/2010  . Essential hypertension 08/13/2009  . HIATAL HERNIA 08/13/2009  . PALPITATIONS 08/13/2009  . DYSPNEA 08/13/2009    Toney Rakes, OT student 07/08/2018, 2:19 PM  [facility] [address] [place], [place], [address] Phone: [phone]   Fax:  [phone]  Name: SUMIYA MAMARIL MRN: [id number] Date of Birth: Feb 18, 1966

## 2018-07-12 ENCOUNTER — Encounter (HOSPITAL_COMMUNITY): Payer: Self-pay

## 2018-07-12 ENCOUNTER — Ambulatory Visit (HOSPITAL_COMMUNITY): Payer: PRIVATE HEALTH INSURANCE

## 2018-07-12 DIAGNOSIS — M25612 Stiffness of left shoulder, not elsewhere classified: Secondary | ICD-10-CM

## 2018-07-12 DIAGNOSIS — R29898 Other symptoms and signs involving the musculoskeletal system: Secondary | ICD-10-CM | POA: Diagnosis not present

## 2018-07-12 DIAGNOSIS — M25512 Pain in left shoulder: Secondary | ICD-10-CM

## 2018-07-12 DIAGNOSIS — M6281 Muscle weakness (generalized): Secondary | ICD-10-CM | POA: Diagnosis not present

## 2018-07-12 NOTE — Therapy (Signed)
[facility] [place], [place], [address] Phone: [phone]   Fax:  [phone]  Occupational Therapy Treatment  Patient Details  Name: [name] MRN: [id number] Date of Birth: 04-24-1966 Referring Provider (OT): Sharilyn Sites, MD   Encounter Date: 07/12/2018  OT End of Session - 07/12/18 1116    Visit Number  3    Number of Visits  8    Date for OT Re-Evaluation  07/29/18    Authorization Type  Workers compensation approved 8 additional visits for her shoulder.     Authorization - Visit Number  3    Authorization - Number of Visits  8    OT Start Time  3710    OT Stop Time  1115    OT Time Calculation (min)  41 min    Activity Tolerance  Patient tolerated treatment well    Behavior During Therapy  WFL for tasks assessed/performed       Past Medical History:  Diagnosis Date  . Arthritis   . Asthma   . Chest pain    cath 2005 norm cors, repeat cath Feb 2011 normal cors and only minimally  elevated pulmonary pressures  . Crohn disease ([place])   . Diverticulosis   . GERD (gastroesophageal reflux disease)   . Hiatal hernia   . Hypertension   . Obesity   . Palpitations   . Sleep apnea sleep study 11/03/2009    cpap; does sometimes, doesnt use every night.    Past Surgical History:  Procedure Laterality Date  . ABDOMINAL HYSTERECTOMY    . ABDOMINAL HYSTERECTOMY    . ANKLE ARTHROSCOPY  06/01/2012   Procedure: ANKLE ARTHROSCOPY;  Surgeon: Colin Rhein, MD;  Location: [facility];  Service: Orthopedics;  Laterality: Left;  left ankle arthorsocpy with extensive debridement and gastroc slide  . CARDIAC CATHETERIZATION  11/22/2009 and 2005   WNL  . CESAREAN SECTION    . CHOLECYSTECTOMY  09/08/2006   lap. chole.  . CHONDROPLASTY  06/17/2012   Procedure: CHONDROPLASTY;  Surgeon: Carole Civil, MD;  [facility];  Service: Orthopedics;  Laterality: Right;  right patella  .  ESOPHAGOGASTRODUODENOSCOPY N/A 05/14/2015   Procedure: ESOPHAGOGASTRODUODENOSCOPY (EGD);  Surgeon: Rogene Houston, MD;  [facility];  Service: Endoscopy;  Laterality: N/A;  730  . INGUINAL HERNIA REPAIR  10/30/2008   right  . KNEE ARTHROSCOPY WITH LATERAL MENISECTOMY Left 12/23/2016   Procedure: LEFT KNEE ARTHROSCOPY WITH LATERAL MENISECTOMY;  Surgeon: Carole Civil, MD;  [facility];  Service: Orthopedics;  Laterality: Left;  . KNEE ARTHROSCOPY WITH MEDIAL MENISECTOMY Left 12/23/2016   Procedure: LEFT KNEE ARTHROSCOPY WITH MEDIAL MENISECTOMY CHONDROPLASTY PATELLA  AND MEDIAL FEMORAL CONDYLE LEFT KNEE;  Surgeon: Carole Civil, MD;  [facility];  Service: Orthopedics;  Laterality: Left;  . SHOULDER ARTHROSCOPY WITH SUBACROMIAL DECOMPRESSION AND BICEP TENDON REPAIR Left 12/21/2017   Procedure: Left shoulder arthroscopic biceps tenodesis, SAD, DCR and labrum debridement;  Surgeon: Nicholes Stairs, MD;  Location: [facility];  Service: Orthopedics;  Laterality: Left;  120 mins  . SHOULDER SURGERY     right x 2     There were no vitals filed for this visit.  Subjective Assessment - 07/12/18 1059    Subjective   S: It just feels like something catches.     Currently in Pain?  Yes    Pain Score  4     Pain Location  Shoulder    Pain Orientation  Left    Pain Descriptors / Indicators  Aching         [facility] OT Assessment - 07/12/18 1059      Assessment   Medical Diagnosis  Arthritis of left acromioclavicular joint       Precautions   Precautions  Shoulder    Type of Shoulder Precautions  No lifting over 5#               OT Treatments/Exercises (OP) - 07/12/18 1059      Exercises   Exercises  Shoulder      Shoulder Exercises: Supine   Protraction  PROM;5 reps    Horizontal ABduction  PROM;5 reps    External Rotation  PROM;5 reps    Internal Rotation  PROM;5 reps    Flexion  PROM;5 reps    ABduction  PROM;5 reps      Shoulder Exercises: Seated    Protraction  Strengthening;10 reps    Protraction Weight (lbs)  2    Horizontal ABduction  Strengthening;10 reps    Horizontal ABduction Weight (lbs)  2    External Rotation  Strengthening;10 reps    External Rotation Weight (lbs)  2    Internal Rotation  Strengthening;10 reps    Internal Rotation Weight (lbs)  2    Flexion  Strengthening;10 reps    Flexion Weight (lbs)  2    Abduction  Strengthening;10 reps    ABduction Weight (lbs)  2      Shoulder Exercises: ROM/Strengthening   UBE (Upper Arm Bike)  Level 2 3' reverse only   pace: 3.5-4.0   Proximal Shoulder Strengthening, Seated  A/ROM 12X one rest breaks      Manual Therapy   Manual Therapy  Myofascial release    Manual therapy comments  completed seperately from all other skilled interventions    Myofascial Release  myofascial release to left shoulder, trapezius, scapular, and upper arm region to decrease pain and improve pain free mobility.                OT Short Term Goals - 07/08/18 1417      OT SHORT TERM GOAL #1   Title  Patient will decrease fascial restrictions in LUE to moderate or less in order to perform overhead reaching.     Time  4    Period  Weeks    Status  On-going      OT SHORT TERM GOAL #2   Title  Pt will increase left shoulder strength to 5/5 for more functional use of UE in order to perform work tasks.     Time  4    Period  Weeks    Status  On-going      OT SHORT TERM GOAL #3   Title  Pt will decrease pain level in left shoulder to 2/10 in order to participate in daily grooming without pain.     Time  4    Period  Weeks    Status  On-going      OT SHORT TERM GOAL #4   Title  Pt will improve Left shoulder A/ROM to within normal limits to improve ease when reaching behind her in the car.      Time  4    Period  Weeks    Status  On-going      OT SHORT TERM GOAL #5   Title  Pt will become independent with HEP to improve  ability to return to light housekeeping.     Time  4     Period  Weeks    Status  On-going               Plan - 07/12/18 1134    Clinical Impression Statement  A: Progressed patient to 2# hand weights for shoulder strengthening. During exercises patient would actively protract her scapula while completing all shoulder ranges with 2# in order to decrease feeling of "catching" and/or discomfort level.  VC for form and technique.     Plan  P: Continue with shoulder and scapular strengthening. Add scapular theraband exercises. Add therapy ball exercises seated.     Consulted and Agree with Plan of Care  Patient       Patient will benefit from skilled therapeutic intervention in order to improve the following deficits and impairments:  Decreased activity tolerance, Decreased strength, Impaired flexibility, Decreased range of motion, Pain, Decreased coordination, Increased fascial restrictions, Impaired UE functional use  Visit Diagnosis: Acute pain of left shoulder  Stiffness of left shoulder, not elsewhere classified  Other symptoms and signs involving the musculoskeletal system    Problem List Patient Active Problem List   Diagnosis Date Noted  . Derangement of anterior horn of lateral meniscus of left knee   . Derangement of posterior horn of medial meniscus of left knee   . Chest pain 04/25/2016  . Hypokalemia 04/25/2016  . Hyperglycemia 04/25/2016  . Diverticulitis of colon 07/17/2014  . Nausea without vomiting 07/17/2014  . Crohn disease ([place]) 07/17/2014  . Diverticulitis of colon without hemorrhage 08/10/2013  . History of arthroscopy of right knee 07/25/2012  . Difficulty in walking(719.7) 06/29/2012  . Weakness of right leg 06/29/2012  . Posterior tibial tendonitis 11/11/2011  . PTTD (posterior tibial tendon dysfunction) 11/11/2011  . Knee pain, right 11/11/2011  . Chest pain 07/16/2011  . Primary osteoarthritis of left knee 02/25/2010  . PAIN IN JOINT, MULTIPLE SITES 02/25/2010  . Essential hypertension 08/13/2009   . HIATAL HERNIA 08/13/2009  . PALPITATIONS 08/13/2009  . DYSPNEA 08/13/2009   Ailene Ravel, OTR/L,CBIS  [phone]  07/12/2018, 12:18 PM  [facility] [address] [place], [place], [address] Phone: [phone]   Fax:  [phone]  Name: [name] MRN: [id number] Date of Birth: Dec 15, 1965

## 2018-07-18 ENCOUNTER — Telehealth (HOSPITAL_COMMUNITY): Payer: Self-pay | Admitting: Family Medicine

## 2018-07-18 ENCOUNTER — Ambulatory Visit (HOSPITAL_COMMUNITY): Payer: PRIVATE HEALTH INSURANCE

## 2018-07-18 ENCOUNTER — Encounter (HOSPITAL_COMMUNITY): Payer: Self-pay

## 2018-07-18 DIAGNOSIS — R29898 Other symptoms and signs involving the musculoskeletal system: Secondary | ICD-10-CM | POA: Diagnosis not present

## 2018-07-18 DIAGNOSIS — M6281 Muscle weakness (generalized): Secondary | ICD-10-CM | POA: Diagnosis not present

## 2018-07-18 DIAGNOSIS — M25512 Pain in left shoulder: Secondary | ICD-10-CM | POA: Diagnosis not present

## 2018-07-18 DIAGNOSIS — M25612 Stiffness of left shoulder, not elsewhere classified: Secondary | ICD-10-CM

## 2018-07-18 NOTE — Therapy (Signed)
[facility] [place], [place], [address] Phone: [phone]   Fax:  [phone]  Occupational Therapy Treatment  Patient Details  Name: [name] MRN: [id number] Date of Birth: 1966-08-24 Referring Provider (OT): Sharilyn Sites, MD   Encounter Date: 07/18/2018  OT End of Session - 07/18/18 1559    Visit Number  4    Number of Visits  8    Date for OT Re-Evaluation  07/29/18    Authorization Type  Workers compensation approved 8 additional visits for her shoulder.     Authorization - Visit Number  4    Authorization - Number of Visits  8    OT Start Time  1520    OT Stop Time  1600    OT Time Calculation (min)  40 min    Activity Tolerance  Patient tolerated treatment well    Behavior During Therapy  WFL for tasks assessed/performed       Past Medical History:  Diagnosis Date  . Arthritis   . Asthma   . Chest pain    cath 2005 norm cors, repeat cath Feb 2011 normal cors and only minimally  elevated pulmonary pressures  . Crohn disease ([place])   . Diverticulosis   . GERD (gastroesophageal reflux disease)   . Hiatal hernia   . Hypertension   . Obesity   . Palpitations   . Sleep apnea sleep study 11/03/2009    cpap; does sometimes, doesnt use every night.    Past Surgical History:  Procedure Laterality Date  . ABDOMINAL HYSTERECTOMY    . ABDOMINAL HYSTERECTOMY    . ANKLE ARTHROSCOPY  06/01/2012   Procedure: ANKLE ARTHROSCOPY;  Surgeon: Colin Rhein, MD;  Location: [facility];  Service: Orthopedics;  Laterality: Left;  left ankle arthorsocpy with extensive debridement and gastroc slide  . CARDIAC CATHETERIZATION  11/22/2009 and 2005   WNL  . CESAREAN SECTION    . CHOLECYSTECTOMY  09/08/2006   lap. chole.  . CHONDROPLASTY  06/17/2012   Procedure: CHONDROPLASTY;  Surgeon: Carole Civil, MD;  [facility];  Service: Orthopedics;  Laterality: Right;  right patella  .  ESOPHAGOGASTRODUODENOSCOPY N/A 05/14/2015   Procedure: ESOPHAGOGASTRODUODENOSCOPY (EGD);  Surgeon: Rogene Houston, MD;  [facility];  Service: Endoscopy;  Laterality: N/A;  730  . INGUINAL HERNIA REPAIR  10/30/2008   right  . KNEE ARTHROSCOPY WITH LATERAL MENISECTOMY Left 12/23/2016   Procedure: LEFT KNEE ARTHROSCOPY WITH LATERAL MENISECTOMY;  Surgeon: Carole Civil, MD;  [facility];  Service: Orthopedics;  Laterality: Left;  . KNEE ARTHROSCOPY WITH MEDIAL MENISECTOMY Left 12/23/2016   Procedure: LEFT KNEE ARTHROSCOPY WITH MEDIAL MENISECTOMY CHONDROPLASTY PATELLA  AND MEDIAL FEMORAL CONDYLE LEFT KNEE;  Surgeon: Carole Civil, MD;  [facility];  Service: Orthopedics;  Laterality: Left;  . SHOULDER ARTHROSCOPY WITH SUBACROMIAL DECOMPRESSION AND BICEP TENDON REPAIR Left 12/21/2017   Procedure: Left shoulder arthroscopic biceps tenodesis, SAD, DCR and labrum debridement;  Surgeon: Nicholes Stairs, MD;  Location: [facility];  Service: Orthopedics;  Laterality: Left;  120 mins  . SHOULDER SURGERY     right x 2     There were no vitals filed for this visit.      [facility] OT Assessment - 07/18/18 1544      Assessment   Medical Diagnosis  Arthritis of left acromioclavicular joint       Precautions   Precautions  Shoulder  Type of Shoulder Precautions  No lifting over 5#               OT Treatments/Exercises (OP) - 07/18/18 1544      Exercises   Exercises  Shoulder      Shoulder Exercises: Supine   Protraction  PROM;5 reps;Strengthening;10 reps    Protraction Weight (lbs)  2    Horizontal ABduction  PROM;5 reps;Strengthening;10 reps    Horizontal ABduction Weight (lbs)  2    External Rotation  PROM;5 reps;Strengthening;10 reps    External Rotation Weight (lbs)  2    Internal Rotation  PROM;5 reps;Strengthening;10 reps    Internal Rotation Weight (lbs)  2    Flexion  PROM;5 reps;Strengthening;10 reps    Shoulder Flexion Weight (lbs)  2     ABduction  PROM;5 reps;Strengthening;10 reps    Shoulder ABduction Weight (lbs)  2      Shoulder Exercises: Seated   Extension  Theraband;10 reps    Theraband Level (Shoulder Extension)  Level 2 (Red)    Row  Theraband;10 reps    Theraband Level (Shoulder Row)  Level 2 (Red)      Shoulder Exercises: ROM/Strengthening   UBE (Upper Arm Bike)  Level 2 3' reverse only   pace: 3.5-4.0   Proximal Shoulder Strengthening, Seated  10X with 1# with no rest breaks    Other ROM/Strengthening Exercises  green therapy ball; 10X; chest press, flexion, diagonals, circles.      Manual Therapy   Manual Therapy  Myofascial release    Manual therapy comments  completed seperately from all other skilled interventions    Myofascial Release  myofascial release to left shoulder, trapezius, scapular, and upper arm region to decrease pain and improve pain free mobility.                OT Short Term Goals - 07/08/18 1417      OT SHORT TERM GOAL #1   Title  Patient will decrease fascial restrictions in LUE to moderate or less in order to perform overhead reaching.     Time  4    Period  Weeks    Status  On-going      OT SHORT TERM GOAL #2   Title  Pt will increase left shoulder strength to 5/5 for more functional use of UE in order to perform work tasks.     Time  4    Period  Weeks    Status  On-going      OT SHORT TERM GOAL #3   Title  Pt will decrease pain level in left shoulder to 2/10 in order to participate in daily grooming without pain.     Time  4    Period  Weeks    Status  On-going      OT SHORT TERM GOAL #4   Title  Pt will improve Left shoulder A/ROM to within normal limits to improve ease when reaching behind her in the car.      Time  4    Period  Weeks    Status  On-going      OT SHORT TERM GOAL #5   Title  Pt will become independent with HEP to improve ability to return to light housekeeping.     Time  4    Period  Weeks    Status  On-going                Plan - 07/18/18 1601  Clinical Impression Statement  A: Patient continued with 2# strengthening for LUE. Continues to have mod fascial restrictions in left upper arm, trapezius, and scapularis region with manual techniques completed to address. VC for form and technique as needed.     Plan  P: Continue with seated strengthening, Add retraction for scapular theraband exercises and provide scapular theraband exercises for HEP.     Consulted and Agree with Plan of Care  Patient       Patient will benefit from skilled therapeutic intervention in order to improve the following deficits and impairments:  Decreased activity tolerance, Decreased strength, Impaired flexibility, Decreased range of motion, Pain, Decreased coordination, Increased fascial restrictions, Impaired UE functional use  Visit Diagnosis: Acute pain of left shoulder  Stiffness of left shoulder, not elsewhere classified  Other symptoms and signs involving the musculoskeletal system    Problem List Patient Active Problem List   Diagnosis Date Noted  . Derangement of anterior horn of lateral meniscus of left knee   . Derangement of posterior horn of medial meniscus of left knee   . Chest pain 04/25/2016  . Hypokalemia 04/25/2016  . Hyperglycemia 04/25/2016  . Diverticulitis of colon 07/17/2014  . Nausea without vomiting 07/17/2014  . Crohn disease ([place]) 07/17/2014  . Diverticulitis of colon without hemorrhage 08/10/2013  . History of arthroscopy of right knee 07/25/2012  . Difficulty in walking(719.7) 06/29/2012  . Weakness of right leg 06/29/2012  . Posterior tibial tendonitis 11/11/2011  . PTTD (posterior tibial tendon dysfunction) 11/11/2011  . Knee pain, right 11/11/2011  . Chest pain 07/16/2011  . Primary osteoarthritis of left knee 02/25/2010  . PAIN IN JOINT, MULTIPLE SITES 02/25/2010  . Essential hypertension 08/13/2009  . HIATAL HERNIA 08/13/2009  . PALPITATIONS 08/13/2009  . DYSPNEA  08/13/2009   Ailene Ravel, OTR/L,CBIS  [phone]  07/18/2018, 5:51 PM  [facility] [address] [place], [place], [address] Phone: [phone]   Fax:  [phone]  Name: [name] MRN: [id number] Date of Birth: Jun 18, 1966

## 2018-07-18 NOTE — Telephone Encounter (Signed)
07/18/18  her husband has to work so we cancelled this appt and made her one for this afternoon

## 2018-07-21 ENCOUNTER — Encounter (HOSPITAL_COMMUNITY): Payer: Self-pay

## 2018-07-21 ENCOUNTER — Ambulatory Visit (HOSPITAL_COMMUNITY): Payer: PRIVATE HEALTH INSURANCE

## 2018-07-21 DIAGNOSIS — M25512 Pain in left shoulder: Secondary | ICD-10-CM | POA: Diagnosis not present

## 2018-07-21 DIAGNOSIS — R29898 Other symptoms and signs involving the musculoskeletal system: Secondary | ICD-10-CM

## 2018-07-21 DIAGNOSIS — M25612 Stiffness of left shoulder, not elsewhere classified: Secondary | ICD-10-CM

## 2018-07-21 DIAGNOSIS — M6281 Muscle weakness (generalized): Secondary | ICD-10-CM | POA: Diagnosis not present

## 2018-07-21 NOTE — Patient Instructions (Signed)

## 2018-07-21 NOTE — Therapy (Signed)
[facility] [address] [place], [place], [address] Phone: [phone]   Fax:  [phone]  Occupational Therapy Treatment  Patient Details  Name: [name] MRN: [id number] Date of Birth: 1966/05/26 Referring Provider (OT): Sharilyn Sites, MD   Encounter Date: 07/21/2018  OT End of Session - 07/21/18 1206    Visit Number  5    Number of Visits  8    Date for OT Re-Evaluation  07/29/18    Authorization Type  Workers compensation approved 8 additional visits for her shoulder.     Authorization - Visit Number  5    Authorization - Number of Visits  8    OT Start Time  1120    OT Stop Time  1200    OT Time Calculation (min)  40 min    Activity Tolerance  Patient tolerated treatment well    Behavior During Therapy  WFL for tasks assessed/performed       Past Medical History:  Diagnosis Date  . Arthritis   . Asthma   . Chest pain    cath 2005 norm cors, repeat cath Feb 2011 normal cors and only minimally  elevated pulmonary pressures  . Crohn disease ([place])   . Diverticulosis   . GERD (gastroesophageal reflux disease)   . Hiatal hernia   . Hypertension   . Obesity   . Palpitations   . Sleep apnea sleep study 11/03/2009    cpap; does sometimes, doesnt use every night.    Past Surgical History:  Procedure Laterality Date  . ABDOMINAL HYSTERECTOMY    . ABDOMINAL HYSTERECTOMY    . ANKLE ARTHROSCOPY  06/01/2012   Procedure: ANKLE ARTHROSCOPY;  Surgeon: Colin Rhein, MD;  Location: [facility];  Service: Orthopedics;  Laterality: Left;  left ankle arthorsocpy with extensive debridement and gastroc slide  . CARDIAC CATHETERIZATION  11/22/2009 and 2005   WNL  . CESAREAN SECTION    . CHOLECYSTECTOMY  09/08/2006   lap. chole.  . CHONDROPLASTY  06/17/2012   Procedure: CHONDROPLASTY;  Surgeon: Carole Civil, MD;  Location: [facility];  Service: Orthopedics;  Laterality: Right;  right patella  .  ESOPHAGOGASTRODUODENOSCOPY N/A 05/14/2015   Procedure: ESOPHAGOGASTRODUODENOSCOPY (EGD);  Surgeon: Rogene Houston, MD;  [facility];  Service: Endoscopy;  Laterality: N/A;  730  . INGUINAL HERNIA REPAIR  10/30/2008   right  . KNEE ARTHROSCOPY WITH LATERAL MENISECTOMY Left 12/23/2016   Procedure: LEFT KNEE ARTHROSCOPY WITH LATERAL MENISECTOMY;  Surgeon: Carole Civil, MD;  Location: [facility];  Service: Orthopedics;  Laterality: Left;  . KNEE ARTHROSCOPY WITH MEDIAL MENISECTOMY Left 12/23/2016   Procedure: LEFT KNEE ARTHROSCOPY WITH MEDIAL MENISECTOMY CHONDROPLASTY PATELLA  AND MEDIAL FEMORAL CONDYLE LEFT KNEE;  Surgeon: Carole Civil, MD;  Location[facility];  Service: Orthopedics;  Laterality: Left;  . SHOULDER ARTHROSCOPY WITH SUBACROMIAL DECOMPRESSION AND BICEP TENDON REPAIR Left 12/21/2017   Procedure: Left shoulder arthroscopic biceps tenodesis, SAD, DCR and labrum debridement;  Surgeon: Nicholes Stairs, MD;  Location: [facility];  Service: Orthopedics;  Laterality: Left;  120 mins  . SHOULDER SURGERY     right x 2     There were no vitals filed for this visit.  Subjective Assessment - 07/21/18 1138    Subjective   S: My arm doesn't feel as tight today.    Currently in Pain?  Yes    Pain Score  2     Pain Location  Shoulder    Pain Orientation  Left    Pain Descriptors / Indicators  Aching    Pain Type  Acute pain    Pain Radiating Towards  N/A    Pain Onset  More than a month ago    Pain Frequency  Constant    Aggravating Factors   movement, and use of arm     Pain Relieving Factors  Tylenol    Effect of Pain on Daily Activities  Mod effect on daily tasks.     Multiple Pain Sites  No         [facility] OT Assessment - 07/21/18 1140      Assessment   Medical Diagnosis  Arthritis of left acromioclavicular joint       Precautions   Precautions  Shoulder    Type of Shoulder Precautions  No lifting over 5#               OT Treatments/Exercises (OP) -  07/21/18 1140      Exercises   Exercises  Shoulder      Shoulder Exercises: Supine   Protraction  PROM;5 reps    Horizontal ABduction  PROM;5 reps    External Rotation  PROM;5 reps    Internal Rotation  PROM;5 reps    Flexion  PROM;5 reps    ABduction  PROM;5 reps      Shoulder Exercises: Seated   Extension  Theraband;10 reps    Theraband Level (Shoulder Extension)  Level 2 (Red)    Retraction  Theraband;10 reps    Theraband Level (Shoulder Retraction)  Level 2 (Red)    Row  Theraband;10 reps    Theraband Level (Shoulder Row)  Level 2 (Red)    Protraction  Strengthening;12 reps    Protraction Weight (lbs)  2    Horizontal ABduction  Strengthening;12 reps    Horizontal ABduction Weight (lbs)  2    External Rotation  Strengthening;12 reps    External Rotation Weight (lbs)  2    Internal Rotation  Strengthening;15 reps    Internal Rotation Weight (lbs)  2    Flexion  Strengthening;12 reps    Flexion Weight (lbs)  2    Abduction  Strengthening;12 reps    ABduction Weight (lbs)  2      Shoulder Exercises: ROM/Strengthening   X to V Arms  10X with1# left arm only             OT Education - 07/21/18 1206    Education provided  Yes    Education Details  red scapular theraband HEP    Person(s) Educated  Patient    Methods  Explanation;Demonstration;Verbal cues;Tactile cues;Handout    Comprehension  Returned demonstration;Verbalized understanding       OT Short Term Goals - 07/08/18 1417      OT SHORT TERM GOAL #1   Title  Patient will decrease fascial restrictions in LUE to moderate or less in order to perform overhead reaching.     Time  4    Period  Weeks    Status  On-going      OT SHORT TERM GOAL #2   Title  Pt will increase left shoulder strength to 5/5 for more functional use of UE in order to perform work tasks.     Time  4    Period  Weeks    Status  On-going      OT SHORT TERM GOAL #3   Title  Pt will  decrease pain level in left shoulder to 2/10 in  order to participate in daily grooming without pain.     Time  4    Period  Weeks    Status  On-going      OT SHORT TERM GOAL #4   Title  Pt will improve Left shoulder A/ROM to within normal limits to improve ease when reaching behind her in the car.      Time  4    Period  Weeks    Status  On-going      OT SHORT TERM GOAL #5   Title  Pt will become independent with HEP to improve ability to return to light housekeeping.     Time  4    Period  Weeks    Status  On-going               Plan - 07/21/18 1206    Clinical Impression Statement  A: Completed strengthening seated with 2# hand weights. patient was unable to complete X to V arms with 2# and was downgraded to 1# to allow for optimal form. Added scapular strengthening to HEP with Red band. VC for form and technique.     Plan  P: Continue with seated strengthening. Add proximal shoulder strengthening supine and seated.     Consulted and Agree with Plan of Care  Patient       Patient will benefit from skilled therapeutic intervention in order to improve the following deficits and impairments:  Decreased activity tolerance, Decreased strength, Impaired flexibility, Decreased range of motion, Pain, Decreased coordination, Increased fascial restrictions, Impaired UE functional use  Visit Diagnosis: Acute pain of left shoulder  Stiffness of left shoulder, not elsewhere classified  Other symptoms and signs involving the musculoskeletal system    Problem List Patient Active Problem List   Diagnosis Date Noted  . Derangement of anterior horn of lateral meniscus of left knee   . Derangement of posterior horn of medial meniscus of left knee   . Chest pain 04/25/2016  . Hypokalemia 04/25/2016  . Hyperglycemia 04/25/2016  . Diverticulitis of colon 07/17/2014  . Nausea without vomiting 07/17/2014  . Crohn disease ([facility]) 07/17/2014  . Diverticulitis of colon without hemorrhage 08/10/2013  . History of arthroscopy of right  knee 07/25/2012  . Difficulty in walking(719.7) 06/29/2012  . Weakness of right leg 06/29/2012  . Posterior tibial tendonitis 11/11/2011  . PTTD (posterior tibial tendon dysfunction) 11/11/2011  . Knee pain, right 11/11/2011  . Chest pain 07/16/2011  . Primary osteoarthritis of left knee 02/25/2010  . PAIN IN JOINT, MULTIPLE SITES 02/25/2010  . Essential hypertension 08/13/2009  . HIATAL HERNIA 08/13/2009  . PALPITATIONS 08/13/2009  . DYSPNEA 08/13/2009   Ailene Ravel, OTR/L,CBIS  [phone]  07/21/2018, 12:08 PM  [facility] [address] [place], [place], [address] Phone: [phone]   Fax:  [phone]  Name: DEBORAHA GOAR MRN: [id number] Date of Birth: 04/18/1966

## 2018-07-22 ENCOUNTER — Encounter (HOSPITAL_COMMUNITY): Payer: Self-pay | Admitting: Occupational Therapy

## 2018-07-26 ENCOUNTER — Telehealth (HOSPITAL_COMMUNITY): Payer: Self-pay | Admitting: Family Medicine

## 2018-07-26 ENCOUNTER — Ambulatory Visit (HOSPITAL_COMMUNITY): Payer: PRIVATE HEALTH INSURANCE

## 2018-07-26 NOTE — Telephone Encounter (Signed)
07/26/18  10:51 left a message to cx today's appt - Mickel Baas had an emergency she had to leave

## 2018-07-29 ENCOUNTER — Ambulatory Visit (HOSPITAL_COMMUNITY): Payer: PRIVATE HEALTH INSURANCE

## 2018-07-29 ENCOUNTER — Encounter (HOSPITAL_COMMUNITY): Payer: Self-pay

## 2018-07-29 DIAGNOSIS — R29898 Other symptoms and signs involving the musculoskeletal system: Secondary | ICD-10-CM

## 2018-07-29 DIAGNOSIS — M25612 Stiffness of left shoulder, not elsewhere classified: Secondary | ICD-10-CM | POA: Diagnosis not present

## 2018-07-29 DIAGNOSIS — M6281 Muscle weakness (generalized): Secondary | ICD-10-CM | POA: Diagnosis not present

## 2018-07-29 DIAGNOSIS — M25512 Pain in left shoulder: Secondary | ICD-10-CM | POA: Diagnosis not present

## 2018-07-29 NOTE — Patient Instructions (Signed)
Complete the following exercises 2-3 times a day.   Wall Flexion  Slide your arm up the wall or door frame until a stretch is felt in your shoulder . Hold for 15-30 seconds. Complete 2 times     Shoulder Abduction Stretch  Stand side ways by a wall with affected up on wall. Gently step in toward wall to feel stretch. Hold for 15-30 seconds. Complete 2 times.    Shoulder abduction external rotation stretch - In BED  Lie on back with hands behind head. Drop elbows out to side. Hold for 15-30 seconds. Complete 1-2 times.   Doorway External Rotation Stretch  Standing in an open doorway, place your arm on the edge of the doorway with the shoulder at 90 degrees from the side and elbow bent to 90 degrees. Lean forward until you feel a stretch on the front of your shoulder. Keep neck relaxed.  Hold for 15-30 seconds. Complete 1-2 times.

## 2018-07-29 NOTE — Therapy (Signed)
[facility] [address] [place], [place], [address] Phone: [phone]   Fax:  [phone]  Occupational Therapy Treatment And reassessment Patient Details  Name: [name] MRN: [id number] Date of Birth: [birth year] Referring Provider (OT): Victorino December, MD   Encounter Date: 07/29/2018  OT End of Session - 07/29/18 0927    Visit Number  6    Number of Visits  8    Date for OT Re-Evaluation  08/05/18    Authorization Type  Workers compensation approved 8 additional visits for her shoulder.     Authorization - Visit Number  6    Authorization - Number of Visits  8    OT Start Time  [phone]    OT Stop Time  0945    OT Time Calculation (min)  35 min    Activity Tolerance  Patient tolerated treatment well    Behavior During Therapy  WFL for tasks assessed/performed       Past Medical History:  Diagnosis Date  . Arthritis   . Asthma   . Chest pain    cath 2005 norm cors, repeat cath Feb 2011 normal cors and only minimally  elevated pulmonary pressures  . Crohn disease ([place])   . Diverticulosis   . GERD (gastroesophageal reflux disease)   . Hiatal hernia   . Hypertension   . Obesity   . Palpitations   . Sleep apnea sleep study 11/03/2009    cpap; does sometimes, doesnt use every night.    Past Surgical History:  Procedure Laterality Date  . ABDOMINAL HYSTERECTOMY    . ABDOMINAL HYSTERECTOMY    . ANKLE ARTHROSCOPY  06/01/2012   Procedure: ANKLE ARTHROSCOPY;  Surgeon: Colin Rhein, MD;  Location: [facility];  Service: Orthopedics;  Laterality: Left;  left ankle arthorsocpy with extensive debridement and gastroc slide  . CARDIAC CATHETERIZATION  11/22/2009 and 2005   WNL  . CESAREAN SECTION    . CHOLECYSTECTOMY  09/08/2006   lap. chole.  . CHONDROPLASTY  06/17/2012   Procedure: CHONDROPLASTY;  Surgeon: Carole Civil, MD;  [facility];  Service: Orthopedics;  Laterality: Right;  right patella  .  ESOPHAGOGASTRODUODENOSCOPY N/A 05/14/2015   Procedure: ESOPHAGOGASTRODUODENOSCOPY (EGD);  Surgeon: Rogene Houston, MD;  [facility];  Service: Endoscopy;  Laterality: N/A;  730  . INGUINAL HERNIA REPAIR  10/30/2008   right  . KNEE ARTHROSCOPY WITH LATERAL MENISECTOMY Left 12/23/2016   Procedure: LEFT KNEE ARTHROSCOPY WITH LATERAL MENISECTOMY;  Surgeon: Carole Civil, MD;  [facility];  Service: Orthopedics;  Laterality: Left;  . KNEE ARTHROSCOPY WITH MEDIAL MENISECTOMY Left 12/23/2016   Procedure: LEFT KNEE ARTHROSCOPY WITH MEDIAL MENISECTOMY CHONDROPLASTY PATELLA  AND MEDIAL FEMORAL CONDYLE LEFT KNEE;  Surgeon: Carole Civil, MD;  [facility];  Service: Orthopedics;  Laterality: Left;  . SHOULDER ARTHROSCOPY WITH SUBACROMIAL DECOMPRESSION AND BICEP TENDON REPAIR Left 12/21/2017   Procedure: Left shoulder arthroscopic biceps tenodesis, SAD, DCR and labrum debridement;  Surgeon: Nicholes Stairs, MD;  Location: [facility];  Service: Orthopedics;  Laterality: Left;  120 mins  . SHOULDER SURGERY     right x 2     There were no vitals filed for this visit.  Subjective Assessment - 07/29/18 0924    Subjective   S: The doctor said I'm doing as expected. He said it'll take some time to get back to 100%.    Currently in Pain?  No/denies  [facility] OT Assessment - 07/29/18 0932      Assessment   Medical Diagnosis  Arthritis of left acromioclavicular joint     Referring Provider (OT)  Victorino December, MD    Onset Date/Surgical Date  --   March 2019     Precautions   Precautions  Shoulder    Type of Shoulder Precautions  No lifting over 5#      Prior Function   Level of Independence  Independent      Palpation   Palpation comment  min fascial restrictionsin left upper arm, and trapezius region.       AROM   Overall AROM Comments  Assessed seated. IR/er adducted    AROM Assessment Site  Shoulder    Right/Left Shoulder  Left    Left Shoulder Flexion  140  Degrees   previous: 135   Left Shoulder ABduction  120 Degrees   previous; 110   Left Shoulder Internal Rotation  90 Degrees   previous: same   Left Shoulder External Rotation  87 Degrees   previous: 85     PROM   Overall PROM   Within functional limits for tasks performed      Strength   Overall Strength Comments  assessed seated. IR/er adducted    Strength Assessment Site  Shoulder    Right/Left Shoulder  Left    Left Shoulder Flexion  4+/5   previous: same   Left Shoulder ABduction  4+/5   previous: 4/5   Left Shoulder Internal Rotation  5/5   previous: 4+/5   Left Shoulder External Rotation  5/5   previous: 4+/5              OT Treatments/Exercises (OP) - 07/29/18 0924      Exercises   Exercises  Shoulder      Shoulder Exercises: Supine   Protraction  PROM;5 reps    Horizontal ABduction  PROM;5 reps    External Rotation  PROM;5 reps    Internal Rotation  PROM;5 reps    Flexion  PROM;5 reps    ABduction  PROM;5 reps      Shoulder Exercises: Seated   Protraction  Strengthening;12 reps    Protraction Weight (lbs)  2    Horizontal ABduction  Strengthening;12 reps    Horizontal ABduction Weight (lbs)  2    External Rotation  Strengthening;12 reps    External Rotation Weight (lbs)  2    Internal Rotation  Strengthening;15 reps    Internal Rotation Weight (lbs)  2    Flexion  Strengthening;12 reps    Flexion Weight (lbs)  2    Abduction  Strengthening;12 reps    ABduction Weight (lbs)  2      Shoulder Exercises: ROM/Strengthening   Proximal Shoulder Strengthening, Supine  12X with 2# no rest breaks      Manual Therapy   Manual Therapy  Myofascial release    Manual therapy comments  completed seperately from all other skilled interventions    Myofascial Release  myofascial release to left shoulder, trapezius, scapular, and upper arm region to decrease pain and improve pain free mobility.              OT Education - 07/29/18 0945    Education  provided  Yes    Education Details  shoulder stretches    Person(s) Educated  Patient    Methods  Explanation;Demonstration;Handout    Comprehension  Verbalized understanding;Returned demonstration  OT Short Term Goals - 07/29/18 1002      OT SHORT TERM GOAL #1   Title  Patient will decrease fascial restrictions in LUE to moderate or less in order to perform overhead reaching.     Time  4    Period  Weeks    Status  Achieved      OT SHORT TERM GOAL #2   Title  Pt will increase left shoulder strength to 5/5 for more functional use of UE in order to perform work tasks.     Time  4    Period  Weeks    Status  Partially Met      OT SHORT TERM GOAL #3   Title  Pt will decrease pain level in left shoulder to 2/10 in order to participate in daily grooming without pain.     Time  4    Period  Weeks    Status  Achieved      OT SHORT TERM GOAL #4   Title  Pt will improve Left shoulder A/ROM to within normal limits to improve ease when reaching behind her in the car.      Time  4    Period  Weeks    Status  Partially Met      OT SHORT TERM GOAL #5   Title  Pt will become independent with HEP to improve ability to return to light housekeeping.     Time  4    Period  Weeks    Status  On-going               Plan - 07/29/18 1002    Clinical Impression Statement  A: Reassesment completed this date as patient's certification period ends this date. Patient has met two goals, 2 more goals partially met and 1 goal still on-going. Recommended that patient complete 2 remaining approved visits to focus on A/ROM and strength of left shoulder in order for her to complete simple daily tasks. HEP will be updated in order for patient to complete at home independently and continue working towards independent use of her LUE. patient continues to have pain located in anterior shoulder region with occassional catching sensation. Occassional numbness is felt during passive stretches as well.      Plan  P: Continue OT services for 2 more visits to update HEP, focus on shoulder strengthening and functional mobility. Follow up on HEP. Conitnue with scapular strengthening. Add X to V arms with 1# or higher with good form.     Consulted and Agree with Plan of Care  Patient       Patient will benefit from skilled therapeutic intervention in order to improve the following deficits and impairments:  Decreased activity tolerance, Decreased strength, Impaired flexibility, Decreased range of motion, Pain, Decreased coordination, Increased fascial restrictions, Impaired UE functional use  Visit Diagnosis: Acute pain of left shoulder - Plan: Ot plan of care cert/re-cert  Stiffness of left shoulder, not elsewhere classified - Plan: Ot plan of care cert/re-cert  Other symptoms and signs involving the musculoskeletal system - Plan: Ot plan of care cert/re-cert    Problem List Patient Active Problem List   Diagnosis Date Noted  . Derangement of anterior horn of lateral meniscus of left knee   . Derangement of posterior horn of medial meniscus of left knee   . Chest pain 04/25/2016  . Hypokalemia 04/25/2016  . Hyperglycemia 04/25/2016  . Diverticulitis of colon 07/17/2014  . Nausea without vomiting  07/17/2014  . Crohn disease ([place]) 07/17/2014  . Diverticulitis of colon without hemorrhage 08/10/2013  . History of arthroscopy of right knee 07/25/2012  . Difficulty in walking(719.7) 06/29/2012  . Weakness of right leg 06/29/2012  . Posterior tibial tendonitis 11/11/2011  . PTTD (posterior tibial tendon dysfunction) 11/11/2011  . Knee pain, right 11/11/2011  . Chest pain 07/16/2011  . Primary osteoarthritis of left knee 02/25/2010  . PAIN IN JOINT, MULTIPLE SITES 02/25/2010  . Essential hypertension 08/13/2009  . HIATAL HERNIA 08/13/2009  . PALPITATIONS 08/13/2009  . DYSPNEA 08/13/2009   Ailene Ravel, OTR/L,CBIS  [phone]  07/29/2018, 10:30 AM  [facility] [address] [place], [place], [address] Phone: [phone]   Fax:  [phone]  Name: [name] MRN: [id number] Date of Birth: 06/20/1966

## 2018-08-04 ENCOUNTER — Ambulatory Visit (HOSPITAL_COMMUNITY): Payer: PRIVATE HEALTH INSURANCE | Admitting: Occupational Therapy

## 2018-08-04 ENCOUNTER — Encounter (HOSPITAL_COMMUNITY): Payer: Self-pay | Admitting: Occupational Therapy

## 2018-08-04 DIAGNOSIS — R29898 Other symptoms and signs involving the musculoskeletal system: Secondary | ICD-10-CM

## 2018-08-04 DIAGNOSIS — M25512 Pain in left shoulder: Secondary | ICD-10-CM

## 2018-08-04 DIAGNOSIS — M25612 Stiffness of left shoulder, not elsewhere classified: Secondary | ICD-10-CM | POA: Diagnosis not present

## 2018-08-04 DIAGNOSIS — M6281 Muscle weakness (generalized): Secondary | ICD-10-CM | POA: Diagnosis not present

## 2018-08-04 NOTE — Patient Instructions (Signed)
Repeat all exercises 10-15 times, 1-2 times per day.  1) Shoulder Protraction    Begin with elbows by your side, slowly "punch" straight out in front of you.      2) Shoulder Flexion  Supine:     Standing:         Begin with arms at your side with thumbs pointed up, slowly raise both arms up and forward towards overhead.               3) Horizontal abduction/adduction  Supine:   Standing:           Begin with arms straight out in front of you, bring out to the side in at "T" shape. Keep arms straight entire time.                 4) Internal & External Rotation    *No band* -Stand with elbows at the side and elbows bent 90 degrees. Move your forearms away from your body, then bring back inward toward the body.     5) Shoulder Abduction  Supine:     Standing:       Lying on your back begin with your arms flat on the table next to your side. Slowly move your arms out to the side so that they go overhead, in a jumping jack or snow angel movement.    6) X to V arms (cheerleader move):  Begin with arms straight down, crossed in front of body in an "X". Keeping arms crossed, lift arms straight up overhead. Then spread arms apart into a "V" shape.  Bring back together into x and lower down to starting position.    7) W arms:  Begin with elbows bent and even with shoulders, hands pointing to ceiling. Keeping elbows at shoulder level, 1-shrug shoulders up, 2-squeeze shoulder blades together, and 3-relax.

## 2018-08-04 NOTE — Therapy (Signed)
[facility] [place], [place], [address] Phone: [phone]   Fax:  [phone]  Occupational Therapy Treatment  Patient Details  Name: [name] MRN: [id number] Date of Birth: 12-May-1966 Referring Provider (OT): Victorino December, MD   Encounter Date: 08/04/2018  OT End of Session - 08/04/18 1419    Visit Number  7    Number of Visits  8    Date for OT Re-Evaluation  08/05/18    Authorization Type  Workers compensation approved 8 additional visits for her shoulder.     Authorization - Visit Number  7    Authorization - Number of Visits  8    OT Start Time  5284    OT Stop Time  1345    OT Time Calculation (min)  42 min    Activity Tolerance  Patient tolerated treatment well    Behavior During Therapy  WFL for tasks assessed/performed       Past Medical History:  Diagnosis Date  . Arthritis   . Asthma   . Chest pain    cath 2005 norm cors, repeat cath Feb 2011 normal cors and only minimally  elevated pulmonary pressures  . Crohn disease ([place])   . Diverticulosis   . GERD (gastroesophageal reflux disease)   . Hiatal hernia   . Hypertension   . Obesity   . Palpitations   . Sleep apnea sleep study 11/03/2009    cpap; does sometimes, doesnt use every night.    Past Surgical History:  Procedure Laterality Date  . ABDOMINAL HYSTERECTOMY    . ABDOMINAL HYSTERECTOMY    . ANKLE ARTHROSCOPY  06/01/2012   Procedure: ANKLE ARTHROSCOPY;  Surgeon: Colin Rhein, MD;  Location: [facility];  Service: Orthopedics;  Laterality: Left;  left ankle arthorsocpy with extensive debridement and gastroc slide  . CARDIAC CATHETERIZATION  11/22/2009 and 2005   WNL  . CESAREAN SECTION    . CHOLECYSTECTOMY  09/08/2006   lap. chole.  . CHONDROPLASTY  06/17/2012   Procedure: CHONDROPLASTY;  Surgeon: Carole Civil, MD;  Location: [facility];  Service: Orthopedics;  Laterality: Right;  right patella  .  ESOPHAGOGASTRODUODENOSCOPY N/A 05/14/2015   Procedure: ESOPHAGOGASTRODUODENOSCOPY (EGD);  Surgeon: Rogene Houston, MD;  [facility];  Service: Endoscopy;  Laterality: N/A;  730  . INGUINAL HERNIA REPAIR  10/30/2008   right  . KNEE ARTHROSCOPY WITH LATERAL MENISECTOMY Left 12/23/2016   Procedure: LEFT KNEE ARTHROSCOPY WITH LATERAL MENISECTOMY;  Surgeon: Carole Civil, MD;  Location: [facility];  Service: Orthopedics;  Laterality: Left;  . KNEE ARTHROSCOPY WITH MEDIAL MENISECTOMY Left 12/23/2016   Procedure: LEFT KNEE ARTHROSCOPY WITH MEDIAL MENISECTOMY CHONDROPLASTY PATELLA  AND MEDIAL FEMORAL CONDYLE LEFT KNEE;  Surgeon: Carole Civil, MD;  Location: [facility];  Service: Orthopedics;  Laterality: Left;  . SHOULDER ARTHROSCOPY WITH SUBACROMIAL DECOMPRESSION AND BICEP TENDON REPAIR Left 12/21/2017   Procedure: Left shoulder arthroscopic biceps tenodesis, SAD, DCR and labrum debridement;  Surgeon: Nicholes Stairs, MD;  Location: [facility];  Service: Orthopedics;  Laterality: Left;  120 mins  . SHOULDER SURGERY     right x 2     There were no vitals filed for this visit.  Subjective Assessment - 08/04/18 1314    Subjective   S: I'm doing my exercises for homework.     Currently in Pain?  Yes    Pain Score  2     Pain Location  Shoulder    Pain Orientation  Left    Pain Descriptors / Indicators  Aching    Pain Type  Acute pain    Pain Radiating Towards  N/A    Pain Onset  More than a month ago    Pain Frequency  Intermittent    Aggravating Factors   Reaching and lifting with arm    Pain Relieving Factors  Medication, heat    Effect of Pain on Daily Activities  Mod effect on daily activities.          [facility] OT Assessment - 08/04/18 1316      Assessment   Medical Diagnosis  Arthritis of left acromioclavicular joint     Referring Provider (OT)  Victorino December, MD      Precautions   Precautions  Shoulder    Type of Shoulder Precautions  No lifting over 5#                OT Treatments/Exercises (OP) - 08/04/18 1316      Exercises   Exercises  Shoulder      Shoulder Exercises: Supine   Protraction  PROM;5 reps    Horizontal ABduction  PROM;5 reps    External Rotation  PROM;5 reps    Internal Rotation  PROM;5 reps    Flexion  PROM;5 reps    ABduction  PROM;5 reps      Shoulder Exercises: Seated   Protraction  Strengthening;Weights;15 reps    Protraction Weight (lbs)  2    Horizontal ABduction  Strengthening;Weights;15 reps   2 rest breaks required    Horizontal ABduction Weight (lbs)  2    External Rotation  Strengthening;Weights;15 reps    External Rotation Weight (lbs)  2    Internal Rotation  Strengthening;Weights;15 reps    Internal Rotation Weight (lbs)  2    Flexion  Strengthening;Weights;15 reps   2 rest breaks needed    Flexion Weight (lbs)  2    Abduction  Strengthening;Weights;15 reps   significant difficulty    ABduction Weight (lbs)  2   1 rest break     Shoulder Exercises: ROM/Strengthening   "W" Arms  12X with 2# weights    X to V Arms  12X with 2# weight (only left arm weighted)    1 short rest break   Proximal Shoulder Strengthening, Seated  12X with 2# weights    1 rest break      Manual Therapy   Manual Therapy  Myofascial release    Manual therapy comments  completed seperately from all other skilled interventions    Myofascial Release  myofascial release to left shoulder, trapezius, scapular, and upper arm region to decrease pain and improve pain free mobility.              OT Education - 08/04/18 1418    Education provided  Yes    Education Details  A/ROM shoulder exercises with 1# or 2# weights.    Person(s) Educated  Patient    Methods  Explanation;Demonstration;Handout    Comprehension  Verbalized understanding;Returned demonstration       OT Short Term Goals - 07/29/18 1002      OT SHORT TERM GOAL #1   Title  Patient will decrease fascial restrictions in LUE to moderate or  less in order to perform overhead reaching.     Time  4    Period  Weeks    Status  Achieved      OT SHORT  TERM GOAL #2   Title  Pt will increase left shoulder strength to 5/5 for more functional use of UE in order to perform work tasks.     Time  4    Period  Weeks    Status  Partially Met      OT SHORT TERM GOAL #3   Title  Pt will decrease pain level in left shoulder to 2/10 in order to participate in daily grooming without pain.     Time  4    Period  Weeks    Status  Achieved      OT SHORT TERM GOAL #4   Title  Pt will improve Left shoulder A/ROM to within normal limits to improve ease when reaching behind her in the car.      Time  4    Period  Weeks    Status  Partially Met      OT SHORT TERM GOAL #5   Title  Pt will become independent with HEP to improve ability to return to light housekeeping.     Time  4    Period  Weeks    Status  On-going               Plan - 08/04/18 1532    Clinical Impression Statement  A: Continued with shoulder strengthening and added W arms with 2# weights which pt tolerated with mod fatigue. Provided pt with A/ROM shoulder exercises to use with 1# or 2# weights today because she was not certain that she would be able to attend her last session tomorrow. At end of session, pt changed appointment time from tomorrow afternoon to the morning and will be able to attend her last session before discharge.    Plan  P: Continue to focus on shoulder strengthening, stability, and functional mobility. Follow up on HEP provided last session and discharge pt.     Consulted and Agree with Plan of Care  Patient       Patient will benefit from skilled therapeutic intervention in order to improve the following deficits and impairments:  Decreased activity tolerance, Decreased strength, Impaired flexibility, Decreased range of motion, Pain, Decreased coordination, Increased fascial restrictions, Impaired UE functional use  Visit Diagnosis: Acute pain  of left shoulder  Stiffness of left shoulder, not elsewhere classified  Other symptoms and signs involving the musculoskeletal system    Problem List Patient Active Problem List   Diagnosis Date Noted  . Derangement of anterior horn of lateral meniscus of left knee   . Derangement of posterior horn of medial meniscus of left knee   . Chest pain 04/25/2016  . Hypokalemia 04/25/2016  . Hyperglycemia 04/25/2016  . Diverticulitis of colon 07/17/2014  . Nausea without vomiting 07/17/2014  . Crohn disease ([place]) 07/17/2014  . Diverticulitis of colon without hemorrhage 08/10/2013  . History of arthroscopy of right knee 07/25/2012  . Difficulty in walking(719.7) 06/29/2012  . Weakness of right leg 06/29/2012  . Posterior tibial tendonitis 11/11/2011  . PTTD (posterior tibial tendon dysfunction) 11/11/2011  . Knee pain, right 11/11/2011  . Chest pain 07/16/2011  . Primary osteoarthritis of left knee 02/25/2010  . PAIN IN JOINT, MULTIPLE SITES 02/25/2010  . Essential hypertension 08/13/2009  . HIATAL HERNIA 08/13/2009  . PALPITATIONS 08/13/2009  . DYSPNEA 08/13/2009    Toney Rakes, OT student  08/04/2018, 3:37 PM  [facility] [address] [place], [place], [address] Phone: [phone]   Fax:  [phone]  Name: [name] MRN: [id number] Date of Birth: 1966/07/25

## 2018-08-05 ENCOUNTER — Encounter (HOSPITAL_COMMUNITY): Payer: Self-pay

## 2018-08-05 ENCOUNTER — Ambulatory Visit (HOSPITAL_COMMUNITY): Payer: 59 | Attending: Family Medicine

## 2018-08-05 DIAGNOSIS — M25612 Stiffness of left shoulder, not elsewhere classified: Secondary | ICD-10-CM | POA: Insufficient documentation

## 2018-08-05 DIAGNOSIS — R29898 Other symptoms and signs involving the musculoskeletal system: Secondary | ICD-10-CM | POA: Insufficient documentation

## 2018-08-05 DIAGNOSIS — M25512 Pain in left shoulder: Secondary | ICD-10-CM | POA: Insufficient documentation

## 2018-08-05 NOTE — Therapy (Signed)
[facility] [place], [place], [address] Phone: [phone]   Fax:  [phone]  Occupational Therapy Treatment  Patient Details  Name: [name] MRN: [id number] Date of Birth: 1965-12-15 Referring Provider (OT): Victorino December, MD   Encounter Date: 08/05/2018  OT End of Session - 08/05/18 1038    Visit Number  8    Number of Visits  8    Authorization Type  Workers compensation approved 8 additional visits for her shoulder.     Authorization - Visit Number  8    Authorization - Number of Visits  8    OT Start Time  0901    OT Stop Time  0945    OT Time Calculation (min)  44 min    Activity Tolerance  Patient tolerated treatment well    Behavior During Therapy  WFL for tasks assessed/performed       Past Medical History:  Diagnosis Date  . Arthritis   . Asthma   . Chest pain    cath 2005 norm cors, repeat cath Feb 2011 normal cors and only minimally  elevated pulmonary pressures  . Crohn disease ([place])   . Diverticulosis   . GERD (gastroesophageal reflux disease)   . Hiatal hernia   . Hypertension   . Obesity   . Palpitations   . Sleep apnea sleep study 11/03/2009    cpap; does sometimes, doesnt use every night.    Past Surgical History:  Procedure Laterality Date  . ABDOMINAL HYSTERECTOMY    . ABDOMINAL HYSTERECTOMY    . ANKLE ARTHROSCOPY  06/01/2012   Procedure: ANKLE ARTHROSCOPY;  Surgeon: Colin Rhein, MD;  Location: [facility];  Service: Orthopedics;  Laterality: Left;  left ankle arthorsocpy with extensive debridement and gastroc slide  . CARDIAC CATHETERIZATION  11/22/2009 and 2005   WNL  . CESAREAN SECTION    . CHOLECYSTECTOMY  09/08/2006   lap. chole.  . CHONDROPLASTY  06/17/2012   Procedure: CHONDROPLASTY;  Surgeon: Carole Civil, MD;  [facility];  Service: Orthopedics;  Laterality: Right;  right patella  . ESOPHAGOGASTRODUODENOSCOPY N/A 05/14/2015   Procedure:  ESOPHAGOGASTRODUODENOSCOPY (EGD);  Surgeon: Rogene Houston, MD;  [facility];  Service: Endoscopy;  Laterality: N/A;  730  . INGUINAL HERNIA REPAIR  10/30/2008   right  . KNEE ARTHROSCOPY WITH LATERAL MENISECTOMY Left 12/23/2016   Procedure: LEFT KNEE ARTHROSCOPY WITH LATERAL MENISECTOMY;  Surgeon: Carole Civil, MD;  [facility];  Service: Orthopedics;  Laterality: Left;  . KNEE ARTHROSCOPY WITH MEDIAL MENISECTOMY Left 12/23/2016   Procedure: LEFT KNEE ARTHROSCOPY WITH MEDIAL MENISECTOMY CHONDROPLASTY PATELLA  AND MEDIAL FEMORAL CONDYLE LEFT KNEE;  Surgeon: Carole Civil, MD;  Location[facility];  Service: Orthopedics;  Laterality: Left;  . SHOULDER ARTHROSCOPY WITH SUBACROMIAL DECOMPRESSION AND BICEP TENDON REPAIR Left 12/21/2017   Procedure: Left shoulder arthroscopic biceps tenodesis, SAD, DCR and labrum debridement;  Surgeon: Nicholes Stairs, MD;  Location: [facility];  Service: Orthopedics;  Laterality: Left;  120 mins  . SHOULDER SURGERY     right x 2     There were no vitals filed for this visit.  Subjective Assessment - 08/05/18 0925    Subjective   S: My strength is probably what I'd like to work on.     Currently in Pain?  Yes    Pain Score  2     Pain Location  Shoulder  Pain Orientation  Left    Pain Descriptors / Indicators  Aching    Pain Type  Acute pain         [facility] OT Assessment - 08/05/18 0926      Assessment   Medical Diagnosis  Arthritis of left acromioclavicular joint       Precautions   Precautions  Shoulder    Type of Shoulder Precautions  No lifting over 5#               OT Treatments/Exercises (OP) - 08/05/18 0927      Exercises   Exercises  Shoulder      Shoulder Exercises: Supine   Protraction  PROM;5 reps    Horizontal ABduction  PROM;5 reps    External Rotation  PROM;5 reps    Internal Rotation  PROM;5 reps    Flexion  PROM;5 reps    ABduction  PROM;5 reps      Shoulder Exercises: Seated   Protraction   Strengthening;Weights;15 reps    Protraction Weight (lbs)  2    Horizontal ABduction  Strengthening;Weights;15 reps    Horizontal ABduction Weight (lbs)  2    External Rotation  Strengthening;Weights;15 reps    External Rotation Weight (lbs)  2    Internal Rotation  Strengthening;Weights;15 reps    Internal Rotation Weight (lbs)  2    Flexion  Strengthening;Weights;15 reps    Flexion Weight (lbs)  2    Abduction  Strengthening;Weights;15 reps    ABduction Weight (lbs)  2      Shoulder Exercises: ROM/Strengthening   "W" Arms  12X with 2# weights    X to V Arms  12X with 2# weight (only left arm weighted)     Proximal Shoulder Strengthening, Seated  12X with 2# weights    1 rest break   Other ROM/Strengthening Exercises  Green therapy ball: 12X: chest press, flexion, circles (both directions), and diagonals (both directions)       Manual Therapy   Manual Therapy  Myofascial release    Manual therapy comments  completed seperately from all other skilled interventions    Myofascial Release  myofascial release to left shoulder, trapezius, scapular, and upper arm region to decrease pain and improve pain free mobility.                OT Short Term Goals - 08/05/18 0942      OT SHORT TERM GOAL #1   Title  Patient will decrease fascial restrictions in LUE to moderate or less in order to perform overhead reaching.     Time  4    Period  Weeks      OT SHORT TERM GOAL #2   Title  Pt will increase left shoulder strength to 5/5 for more functional use of UE in order to perform work tasks.     Time  4    Period  Weeks    Status  Partially Met      OT SHORT TERM GOAL #3   Title  Pt will decrease pain level in left shoulder to 2/10 in order to participate in daily grooming without pain.     Time  4    Period  Weeks      OT SHORT TERM GOAL #4   Title  Pt will improve Left shoulder A/ROM to within normal limits to improve ease when reaching behind her in the car.      Time  4  Period  Weeks    Status  Achieved      OT SHORT TERM GOAL #5   Title  Pt will become independent with HEP to improve ability to return to light housekeeping.     Time  4    Period  Weeks    Status  Achieved               Plan - 08/05/18 1040    Clinical Impression Statement  A: HEP was updated last session and patient voices no difficulty.  All therapy goals met except her strength goal which she has partially met. Patient reports that she has occassional pain with certain shoulder movements. Her MD states that she will continue to have pain for some time and be may not get 100% return. Patient is independent with her HEP and is confident with completing HEP     Plan  P: D/C from OT services with HEP. Follow up with MD as needed.     Consulted and Agree with Plan of Care  Patient       Patient will benefit from skilled therapeutic intervention in order to improve the following deficits and impairments:  Decreased activity tolerance, Decreased strength, Impaired flexibility, Decreased range of motion, Pain, Decreased coordination, Increased fascial restrictions, Impaired UE functional use  Visit Diagnosis: Acute pain of left shoulder  Stiffness of left shoulder, not elsewhere classified  Other symptoms and signs involving the musculoskeletal system    Problem List Patient Active Problem List   Diagnosis Date Noted  . Derangement of anterior horn of lateral meniscus of left knee   . Derangement of posterior horn of medial meniscus of left knee   . Chest pain 04/25/2016  . Hypokalemia 04/25/2016  . Hyperglycemia 04/25/2016  . Diverticulitis of colon 07/17/2014  . Nausea without vomiting 07/17/2014  . Crohn disease ([place]) 07/17/2014  . Diverticulitis of colon without hemorrhage 08/10/2013  . History of arthroscopy of right knee 07/25/2012  . Difficulty in walking(719.7) 06/29/2012  . Weakness of right leg 06/29/2012  . Posterior tibial tendonitis 11/11/2011  . PTTD  (posterior tibial tendon dysfunction) 11/11/2011  . Knee pain, right 11/11/2011  . Chest pain 07/16/2011  . Primary osteoarthritis of left knee 02/25/2010  . PAIN IN JOINT, MULTIPLE SITES 02/25/2010  . Essential hypertension 08/13/2009  . HIATAL HERNIA 08/13/2009  . PALPITATIONS 08/13/2009  . DYSPNEA 08/13/2009   OCCUPATIONAL THERAPY DISCHARGE SUMMARY  Visits from Start of Care: 8  Current functional level related to goals / functional outcomes: See above   Remaining deficits: See above   Education / Equipment: See above Plan: Patient agrees to discharge.  Patient goals were met. Patient is being discharged due to meeting the stated rehab goals.  ?????        Ailene Ravel, OTR/L,CBIS  [phone]  08/05/2018, 10:50 AM  [facility], [place], [address] Phone: [phone]   Fax:  [phone]  Name: CINDE EBERT MRN: [id number] Date of Birth: 07-03-1966

## 2018-08-15 ENCOUNTER — Other Ambulatory Visit: Payer: Self-pay | Admitting: Orthopedic Surgery

## 2018-08-15 DIAGNOSIS — M5431 Sciatica, right side: Secondary | ICD-10-CM

## 2018-08-15 MED FILL — GABAPENTIN 300 MG CAPSULE: 300 | 30 days supply | Qty: 30 | Fill #0

## 2018-08-16 MED FILL — CYCLOBENZAPRINE 10 MG TAB: 10 | 15 days supply | Qty: 45 | Fill #0

## 2018-08-19 MED FILL — IBUPROFEN 800 MG TAB: 800 | 30 days supply | Qty: 90 | Fill #1

## 2018-09-26 ENCOUNTER — Encounter (HOSPITAL_COMMUNITY): Payer: Self-pay | Admitting: Emergency Medicine

## 2018-09-26 ENCOUNTER — Emergency Department (HOSPITAL_COMMUNITY): Payer: 59

## 2018-09-26 ENCOUNTER — Other Ambulatory Visit: Payer: Self-pay

## 2018-09-26 ENCOUNTER — Observation Stay (HOSPITAL_COMMUNITY)
Admission: EM | Admit: 2018-09-26 | Discharge: 2018-09-27 | Disposition: A | Discharge status: Home / Self Care | Payer: 59 | Attending: Internal Medicine | Admitting: Internal Medicine

## 2018-09-26 DIAGNOSIS — I10 Essential (primary) hypertension: Secondary | ICD-10-CM | POA: Diagnosis not present

## 2018-09-26 DIAGNOSIS — K5090 Crohn's disease, unspecified, without complications: Secondary | ICD-10-CM | POA: Insufficient documentation

## 2018-09-26 DIAGNOSIS — J45909 Unspecified asthma, uncomplicated: Secondary | ICD-10-CM | POA: Insufficient documentation

## 2018-09-26 DIAGNOSIS — Z79899 Other long term (current) drug therapy: Secondary | ICD-10-CM | POA: Diagnosis not present

## 2018-09-26 DIAGNOSIS — R109 Unspecified abdominal pain: Secondary | ICD-10-CM

## 2018-09-26 DIAGNOSIS — K5792 Diverticulitis of intestine, part unspecified, without perforation or abscess without bleeding: Secondary | ICD-10-CM | POA: Diagnosis not present

## 2018-09-26 DIAGNOSIS — K5732 Diverticulitis of large intestine without perforation or abscess without bleeding: Secondary | ICD-10-CM | POA: Diagnosis present

## 2018-09-26 DIAGNOSIS — R509 Fever, unspecified: Secondary | ICD-10-CM | POA: Diagnosis not present

## 2018-09-26 DIAGNOSIS — R1032 Left lower quadrant pain: Secondary | ICD-10-CM | POA: Diagnosis not present

## 2018-09-26 DIAGNOSIS — R110 Nausea: Secondary | ICD-10-CM | POA: Insufficient documentation

## 2018-09-26 LAB — URINALYSIS, ROUTINE W REFLEX MICROSCOPIC
BILIRUBIN URINE: NEGATIVE
Glucose, UA: NEGATIVE mg/dL
HGB URINE DIPSTICK: NEGATIVE
Ketones, ur: NEGATIVE mg/dL
Leukocytes, UA: NEGATIVE
Nitrite: NEGATIVE
PH: 5 (ref 5.0–8.0)
Protein, ur: NEGATIVE mg/dL
Specific Gravity, Urine: 1.03 (ref 1.005–1.030)

## 2018-09-26 LAB — DIFFERENTIAL
Abs Immature Granulocytes: 0.03 10*3/uL (ref 0.00–0.07)
Basophils Absolute: 0 10*3/uL (ref 0.0–0.1)
Basophils Relative: 0 %
EOS PCT: 0 %
Eosinophils Absolute: 0 10*3/uL (ref 0.0–0.5)
Immature Granulocytes: 0 %
LYMPHS PCT: 16 %
Lymphs Abs: 1.5 10*3/uL (ref 0.7–4.0)
Monocytes Absolute: 0.8 10*3/uL (ref 0.1–1.0)
Monocytes Relative: 8 %
Neutro Abs: 6.8 10*3/uL (ref 1.7–7.7)
Neutrophils Relative %: 76 %

## 2018-09-26 LAB — COMPREHENSIVE METABOLIC PANEL
ALBUMIN: 3.8 g/dL (ref 3.5–5.0)
ALT: 14 U/L (ref 0–44)
ANION GAP: 7 (ref 5–15)
AST: 14 U/L — AB (ref 15–41)
Alkaline Phosphatase: 46 U/L (ref 38–126)
BUN: 11 mg/dL (ref 6–20)
CHLORIDE: 107 mmol/L (ref 98–111)
CO2: 24 mmol/L (ref 22–32)
Calcium: 8.9 mg/dL (ref 8.9–10.3)
Creatinine, Ser: 0.78 mg/dL (ref 0.44–1.00)
GFR calc Af Amer: >60 mL/min (ref >60)
GFR calc non Af Amer: >60 mL/min (ref >60)
GLUCOSE: 107 mg/dL — AB (ref 70–99)
POTASSIUM: 3.7 mmol/L (ref 3.5–5.1)
SODIUM: 138 mmol/L (ref 135–145)
Total Bilirubin: 1 mg/dL (ref 0.3–1.2)
Total Protein: 7.2 g/dL (ref 6.5–8.1)

## 2018-09-26 LAB — CBC
HEMATOCRIT: 37.4 % (ref 36.0–46.0)
Hemoglobin: 11.8 g/dL — ABNORMAL LOW (ref 12.0–15.0)
MCH: 27.1 pg (ref 26.0–34.0)
MCHC: 31.6 g/dL (ref 30.0–36.0)
MCV: 85.8 fL (ref 80.0–100.0)
Platelets: 342 10*3/uL (ref 150–400)
RBC: 4.36 MIL/uL (ref 3.87–5.11)
RDW: 14.8 % (ref 11.5–15.5)
WBC: 9.2 10*3/uL (ref 4.0–10.5)
nRBC: 0 % (ref 0.0–0.2)

## 2018-09-26 LAB — LIPASE, BLOOD: LIPASE: 26 U/L (ref 11–51)

## 2018-09-26 LAB — LACTIC ACID, PLASMA
LACTIC ACID, VENOUS: 0.7 mmol/L (ref 0.5–1.9)
LACTIC ACID, VENOUS: 0.8 mmol/L (ref 0.5–1.9)

## 2018-09-26 MED ORDER — MOMETASONE FURO-FORMOTEROL FUM 200-5 MCG/ACT IN AERO
2.0000 | INHALATION_SPRAY | Freq: Two times a day (BID) | RESPIRATORY_TRACT | Status: DC
  Filled 2018-09-26: qty 8.8

## 2018-09-26 MED ORDER — MONTELUKAST SODIUM 10 MG PO TABS
10.0000 mg | ORAL_TABLET | Freq: Every day | ORAL | Status: DC
  Administered 2018-09-26: 10 mg via ORAL
  Filled 2018-09-26: qty 1

## 2018-09-26 MED ORDER — OXYCODONE-ACETAMINOPHEN 5-325 MG PO TABS
2.0000 | ORAL_TABLET | Freq: Once | ORAL | Status: AC
  Administered 2018-09-26: 2 via ORAL
  Filled 2018-09-26: qty 2

## 2018-09-26 MED ORDER — BISACODYL 5 MG PO TBEC: 5.0000 mg | DELAYED_RELEASE_TABLET | Freq: Every day | ORAL | Status: DC | PRN

## 2018-09-26 MED ORDER — FAMOTIDINE IN NACL 20-0.9 MG/50ML-% IV SOLN
20.0000 mg | Freq: Two times a day (BID) | INTRAVENOUS | Status: DC
  Administered 2018-09-26 – 2018-09-27 (×2): 20 mg via INTRAVENOUS
  Filled 2018-09-26 (×2): qty 50

## 2018-09-26 MED ORDER — ONDANSETRON HCL 4 MG/2ML IJ SOLN: 4.0000 mg | Freq: Four times a day (QID) | INTRAMUSCULAR | Status: DC | PRN

## 2018-09-26 MED ORDER — ACETAMINOPHEN 650 MG RE SUPP: 650.0000 mg | Freq: Four times a day (QID) | RECTAL | Status: DC | PRN

## 2018-09-26 MED ORDER — LOSARTAN POTASSIUM 50 MG PO TABS
100.0000 mg | ORAL_TABLET | Freq: Every day | ORAL | Status: DC
  Administered 2018-09-26: 100 mg via ORAL
  Filled 2018-09-26: qty 2

## 2018-09-26 MED ORDER — CIPROFLOXACIN IN D5W 400 MG/200ML IV SOLN
400.0000 mg | Freq: Two times a day (BID) | INTRAVENOUS | Status: DC
  Administered 2018-09-27 (×2): 400 mg via INTRAVENOUS
  Filled 2018-09-26 (×2): qty 200

## 2018-09-26 MED ORDER — ONDANSETRON HCL 4 MG PO TABS: 4.0000 mg | ORAL_TABLET | Freq: Four times a day (QID) | ORAL | Status: DC | PRN

## 2018-09-26 MED ORDER — ZOLPIDEM TARTRATE 5 MG PO TABS: 5.0000 mg | ORAL_TABLET | Freq: Every evening | ORAL | Status: DC | PRN

## 2018-09-26 MED ORDER — FLUTICASONE PROPIONATE 50 MCG/ACT NA SUSP
1.0000 | Freq: Every day | NASAL | Status: DC
  Filled 2018-09-26 (×2): qty 16

## 2018-09-26 MED ORDER — ACETAMINOPHEN 325 MG PO TABS
650.0000 mg | ORAL_TABLET | Freq: Four times a day (QID) | ORAL | Status: DC | PRN
  Administered 2018-09-26: 650 mg via ORAL
  Filled 2018-09-26: qty 2

## 2018-09-26 MED ORDER — SODIUM CHLORIDE 0.9 % IV BOLUS
500.0000 mL | Freq: Once | INTRAVENOUS | Status: AC
  Administered 2018-09-26: 500 mL via INTRAVENOUS

## 2018-09-26 MED ORDER — ENOXAPARIN SODIUM 40 MG/0.4ML ~~LOC~~ SOLN
40.0000 mg | SUBCUTANEOUS | Status: DC
  Administered 2018-09-26: 40 mg via SUBCUTANEOUS
  Filled 2018-09-26: qty 0.4

## 2018-09-26 MED ORDER — MORPHINE SULFATE (PF) 4 MG/ML IV SOLN
4.0000 mg | INTRAVENOUS | Status: AC | PRN
  Administered 2018-09-26 (×2): 4 mg via INTRAVENOUS
  Filled 2018-09-26 (×2): qty 1

## 2018-09-26 MED ORDER — AMLODIPINE BESYLATE 5 MG PO TABS
5.0000 mg | ORAL_TABLET | Freq: Every day | ORAL | Status: DC
  Administered 2018-09-26: 5 mg via ORAL
  Filled 2018-09-26: qty 1

## 2018-09-26 MED ORDER — ONDANSETRON HCL 4 MG/2ML IJ SOLN
4.0000 mg | INTRAMUSCULAR | Status: AC | PRN
  Administered 2018-09-26 (×2): 4 mg via INTRAVENOUS
  Filled 2018-09-26 (×2): qty 2

## 2018-09-26 MED ORDER — MORPHINE SULFATE (PF) 2 MG/ML IV SOLN
1.0000 mg | INTRAVENOUS | Status: DC | PRN
  Administered 2018-09-26: 2 mg via INTRAVENOUS
  Filled 2018-09-26: qty 1

## 2018-09-26 MED ORDER — FENTANYL CITRATE (PF) 100 MCG/2ML IJ SOLN
50.0000 ug | INTRAMUSCULAR | Status: AC | PRN
  Administered 2018-09-26 – 2018-09-27 (×2): 50 ug via INTRAVENOUS
  Filled 2018-09-26 (×2): qty 2

## 2018-09-26 MED ORDER — SODIUM CHLORIDE 0.9 % IV SOLN: INTRAVENOUS | Status: DC

## 2018-09-26 MED ORDER — POTASSIUM CHLORIDE IN NACL 20-0.9 MEQ/L-% IV SOLN
INTRAVENOUS | Status: DC
  Administered 2018-09-26 – 2018-09-27 (×2): via INTRAVENOUS

## 2018-09-26 MED ORDER — ALBUTEROL SULFATE (2.5 MG/3ML) 0.083% IN NEBU: 2.5000 mg | INHALATION_SOLUTION | Freq: Four times a day (QID) | RESPIRATORY_TRACT | Status: DC | PRN

## 2018-09-26 MED ORDER — METRONIDAZOLE IN NACL 5-0.79 MG/ML-% IV SOLN
500.0000 mg | Freq: Once | INTRAVENOUS | Status: AC
  Administered 2018-09-26: 500 mg via INTRAVENOUS
  Filled 2018-09-26: qty 100

## 2018-09-26 MED ORDER — GABAPENTIN 300 MG PO CAPS
300.0000 mg | ORAL_CAPSULE | Freq: Every day | ORAL | Status: DC
  Administered 2018-09-26: 300 mg via ORAL
  Filled 2018-09-26: qty 1

## 2018-09-26 MED ORDER — CIPROFLOXACIN IN D5W 400 MG/200ML IV SOLN
400.0000 mg | Freq: Once | INTRAVENOUS | Status: AC
  Administered 2018-09-26: 400 mg via INTRAVENOUS
  Filled 2018-09-26: qty 200

## 2018-09-26 MED ORDER — LORATADINE 10 MG PO TABS
10.0000 mg | ORAL_TABLET | Freq: Every day | ORAL | Status: DC
  Administered 2018-09-27: 10 mg via ORAL
  Filled 2018-09-26: qty 1

## 2018-09-26 MED ORDER — METRONIDAZOLE IN NACL 5-0.79 MG/ML-% IV SOLN
500.0000 mg | Freq: Three times a day (TID) | INTRAVENOUS | Status: DC
  Administered 2018-09-26 – 2018-09-27 (×3): 500 mg via INTRAVENOUS
  Filled 2018-09-26 (×3): qty 100

## 2018-09-26 NOTE — ED Notes (Signed)
Dr Wynetta Emery in to see pt.

## 2018-09-26 NOTE — ED Provider Notes (Signed)
[facility] EMERGENCY DEPARTMENT Provider Note   CSN: [id number] Arrival date & time: 09/26/18  5621     History   Chief Complaint Chief Complaint  Patient presents with  . Abdominal Pain    HPI [name] is a 52 y.o. female.  HPI  Pt was seen at 0950.  Per pt, c/o gradual onset and worsening of constant LLQ abd "pain" for the past 2 days.  Has been associated with nausea and loose stools, as well as home temps to "102."  Describes the abd pain as "pressure," and similar to her previous diverticulitis flair.  Denies vomiting, no flank pain, no rash, no CP/SOB, no black or blood in stools.     GI: Dr. Laural Golden  Past Medical History:  Diagnosis Date  . Arthritis   . Asthma   . Chest pain    cath 2005 norm cors, repeat cath Feb 2011 normal cors and only minimally  elevated pulmonary pressures  . Crohn disease ([place])   . Diverticulosis   . GERD (gastroesophageal reflux disease)   . Hiatal hernia   . Hypertension   . Obesity   . Palpitations   . Sleep apnea sleep study 11/03/2009    cpap; does sometimes, doesnt use every night.    Patient Active Problem List   Diagnosis Date Noted  . Derangement of anterior horn of lateral meniscus of left knee   . Derangement of posterior horn of medial meniscus of left knee   . Chest pain 04/25/2016  . Hypokalemia 04/25/2016  . Hyperglycemia 04/25/2016  . Diverticulitis of colon 07/17/2014  . Nausea without vomiting 07/17/2014  . Crohn disease ([place]) 07/17/2014  . Diverticulitis of colon without hemorrhage 08/10/2013  . History of arthroscopy of right knee 07/25/2012  . Difficulty in walking(719.7) 06/29/2012  . Weakness of right leg 06/29/2012  . Posterior tibial tendonitis 11/11/2011  . PTTD (posterior tibial tendon dysfunction) 11/11/2011  . Knee pain, right 11/11/2011  . Chest pain 07/16/2011  . Primary osteoarthritis of left knee 02/25/2010  . PAIN IN JOINT, MULTIPLE SITES 02/25/2010  . Essential hypertension  08/13/2009  . HIATAL HERNIA 08/13/2009  . PALPITATIONS 08/13/2009  . DYSPNEA 08/13/2009    Past Surgical History:  Procedure Laterality Date  . ABDOMINAL HYSTERECTOMY    . ABDOMINAL HYSTERECTOMY    . ANKLE ARTHROSCOPY  06/01/2012   Procedure: ANKLE ARTHROSCOPY;  Surgeon: Colin Rhein, MD;  Location: [place];  Service: Orthopedics;  Laterality: Left;  left ankle arthorsocpy with extensive debridement and gastroc slide  . CARDIAC CATHETERIZATION  11/22/2009 and 2005   WNL  . CESAREAN SECTION    . CHOLECYSTECTOMY  09/08/2006   lap. chole.  . CHONDROPLASTY  06/17/2012   Procedure: CHONDROPLASTY;  Surgeon: Carole Civil, MD;  Location: [facility];  Service: Orthopedics;  Laterality: Right;  right patella  . ESOPHAGOGASTRODUODENOSCOPY N/A 05/14/2015   Procedure: ESOPHAGOGASTRODUODENOSCOPY (EGD);  Surgeon: Rogene Houston, MD;  [facility]: [facility];  Service: Endoscopy;  Laterality: N/A;  730  . INGUINAL HERNIA REPAIR  10/30/2008   right  . KNEE ARTHROSCOPY WITH LATERAL MENISECTOMY Left 12/23/2016   Procedure: LEFT KNEE ARTHROSCOPY WITH LATERAL MENISECTOMY;  Surgeon: Carole Civil, MD;  Location: [facility];  Service: Orthopedics;  Laterality: Left;  . KNEE ARTHROSCOPY WITH MEDIAL MENISECTOMY Left 12/23/2016   Procedure: LEFT KNEE ARTHROSCOPY WITH MEDIAL MENISECTOMY CHONDROPLASTY PATELLA  AND MEDIAL FEMORAL CONDYLE LEFT KNEE;  Surgeon: Carole Civil, MD;  Location: [facility];  Service: Orthopedics;  Laterality: Left;  . SHOULDER ARTHROSCOPY WITH SUBACROMIAL DECOMPRESSION AND BICEP TENDON REPAIR Left 12/21/2017   Procedure: Left shoulder arthroscopic biceps tenodesis, SAD, DCR and labrum debridement;  Surgeon: Nicholes Stairs, MD;  Location: [place];  Service: Orthopedics;  Laterality: Left;  120 mins  . SHOULDER SURGERY     right x 2      OB History   No obstetric history on file.      Home Medications    Prior to Admission medications   Medication Sig Start  Date End Date Taking? Authorizing Provider  albuterol (PROVENTIL) (2.5 MG/3ML) 0.083% nebulizer solution Take 2.5 mg by nebulization every 6 (six) hours as needed. For shortness of breath    [provider]  albuterol (VENTOLIN HFA) 108 (90 Base) MCG/ACT inhaler Inhale 1-2 puffs into the lungs every 6 (six) hours as needed for wheezing or shortness of breath.    [provider]  amLODipine (NORVASC) 5 MG tablet Take 5 mg by mouth at bedtime.    [provider]  budesonide-formoterol (SYMBICORT) 160-4.5 MCG/ACT inhaler Inhale 2 puffs into the lungs 2 (two) times daily.    [provider]  cetirizine (ZYRTEC) 10 MG tablet Take 10 mg by mouth daily.    [provider]  cyclobenzaprine (FLEXERIL) 10 MG tablet TAKE ONE TABLET BY MOUTH 3 TIMES DAILY AS NEEDED FOR MUSCLE SPASM(S). 08/18/17   Carole Civil, MD  diclofenac sodium (VOLTAREN) 1 % GEL Apply 4 g topically 4 (four) times daily. 10/29/17   Carole Civil, MD  EPINEPHrine (EPIPEN) 0.3 mg/0.3 mL SOAJ injection Inject 0.3 mLs (0.3 mg total) into the muscle once. 12/15/13   Linton Flemings, MD  famotidine (PEPCID) 20 MG tablet Take 20 mg by mouth as needed for heartburn or indigestion.    [provider]  fluticasone (FLONASE) 50 MCG/ACT nasal spray Place 1 spray into both nostrils daily.    [provider]  gabapentin (NEURONTIN) 300 MG capsule TAKE 1 CAPSULE BY MOUTH ONCE DAILY AT BEDTIME 08/15/18   Carole Civil, MD  ibuprofen (ADVIL,MOTRIN) 800 MG tablet Take 1 tablet (800 mg total) by mouth every 8 (eight) hours as needed. 04/13/18   Carole Civil, MD  losartan (COZAAR) 100 MG tablet Take 100 mg by mouth at bedtime.     [provider]  Magnesium 500 MG CAPS Take by mouth.    [provider]  meclizine (ANTIVERT) 25 MG tablet Take 25 mg by mouth 3 (three) times daily as needed for dizziness.    [provider]  meloxicam (MOBIC) 7.5 MG tablet  Take 7.5 mg by mouth 2 (two) times daily.    [provider]  montelukast (SINGULAIR) 10 MG tablet Take 10 mg by mouth at bedtime.    [provider]  ondansetron (ZOFRAN) 4 MG tablet Take 4 mg by mouth every 8 (eight) hours as needed for nausea or vomiting.    [provider]  OVER THE COUNTER MEDICATION K2 and D3 daily    [provider]  pantoprazole (PROTONIX) 40 MG tablet TAKE (1) TABLET BY MOUTH TWICE DAILY BEFORE A MEAL. Patient taking differently: TAKE (1) TABLET BY MOUTH TWICE DAILY as needed 12/24/16   Rogene Houston, MD    Family History Family History  Problem Relation Age of Onset  . Hypertension Mother   . Lupus Father   . COPD Father   . Congestive Heart Failure Maternal Grandmother  Social History Social History   Tobacco Use  . Smoking status: Never Smoker  . Smokeless tobacco: Never Used  Substance Use Topics  . Alcohol use: No    Alcohol/week: 0.0 standard drinks  . Drug use: No     Allergies   Iodinated diagnostic agents; Other; Wasp venom; Pollen extract; Adhesive [tape]; Hydromorphone hcl; and Omnipaque [iohexol]   Review of Systems Review of Systems ROS: Statement: All systems negative except as marked or noted in the HPI; Constitutional: +fever and chills. ; ; Eyes: Negative for eye pain, redness and discharge. ; ; ENMT: Negative for ear pain, hoarseness, nasal congestion, sinus pressure and sore throat. ; ; Cardiovascular: Negative for chest pain, palpitations, diaphoresis, dyspnea and peripheral edema. ; ; Respiratory: Negative for cough, wheezing and stridor. ; ; Gastrointestinal: +abd pain, nausea, diarrhea. Negative for vomiting, blood in stool, hematemesis, jaundice and rectal bleeding. . ; ; Genitourinary: Negative for dysuria, flank pain and hematuria. ; ; Musculoskeletal: Negative for back pain and neck pain. Negative for swelling and trauma.; ; Skin: Negative for pruritus, rash, abrasions, blisters, bruising  and skin lesion.; ; Neuro: Negative for headache, lightheadedness and neck stiffness. Negative for weakness, altered level of consciousness, altered mental status, extremity weakness, paresthesias, involuntary movement, seizure and syncope.       Physical Exam Updated Vital Signs BP (!) 138/102 (BP Location: Left Arm)   Pulse (!) 102   Temp 98.7 F (37.1 C) (Oral)   Resp 16   Ht 5' 11"  (1.803 m)   Wt 106.6 kg   SpO2 100%   BMI 32.78 kg/m    BP 116/84 (BP Location: Right Arm)   Pulse 79   Temp 98.7 F (37.1 C) (Oral)   Resp 16   Ht 5' 11"  (1.803 m)   Wt 106.6 kg   SpO2 95%   BMI 32.78 kg/m    Physical Exam 0955: Physical examination:  Nursing notes reviewed; Vital signs and O2 SAT reviewed;  Constitutional: Well developed, Well nourished, Well hydrated, Uncomfortable appearing; Head:  Normocephalic, atraumatic; Eyes: EOMI, PERRL, No scleral icterus; ENMT: Mouth and pharynx normal, Mucous membranes moist; Neck: Supple, Full range of motion, No lymphadenopathy; Cardiovascular: Regular rate and rhythm, No gallop; Respiratory: Breath sounds clear & equal bilaterally, No wheezes.  Speaking full sentences with ease, Normal respiratory effort/excursion; Chest: Nontender, Movement normal; Abdomen: Soft, +LLQ>diffuse tenderness to palp. Nondistended, Normal bowel sounds; Genitourinary: No CVA tenderness; Extremities: Peripheral pulses normal, No tenderness, No edema, No calf edema. +walking boot LLE.; Neuro: AA&Ox3, Major CN grossly intact.  Speech clear. No gross focal motor deficits in extremities.; Skin: Color normal, Warm, Dry.   ED Treatments / Results  Labs (all labs ordered are listed, but only abnormal results are displayed)   EKG None  Radiology   Procedures Procedures (including critical care time)  Medications Ordered in ED Medications  morphine 4 MG/ML injection 4 mg (has no administration in time range)  ondansetron (ZOFRAN) injection 4 mg (has no administration  in time range)     Initial Impression / Assessment and Plan / ED Course  I have reviewed the triage vital signs and the nursing notes.  Pertinent labs & imaging results that were available during my care of the patient were reviewed by me and considered in my medical decision making (see chart for details).  MDM Reviewed: previous chart, nursing note and vitals Reviewed previous: labs Interpretation: labs, x-ray and CT scan   Results for orders placed or performed during  the hospital encounter of 09/26/18  Lipase, blood  Result Value Ref Range   Lipase 26 11 - 51 U/L  Comprehensive metabolic panel  Result Value Ref Range   Sodium 138 135 - 145 mmol/L   Potassium 3.7 3.5 - 5.1 mmol/L   Chloride 107 98 - 111 mmol/L   CO2 24 22 - 32 mmol/L   Glucose, Bld 107 (H) 70 - 99 mg/dL   BUN 11 6 - 20 mg/dL   Creatinine, Ser 0.78 0.44 - 1.00 mg/dL   Calcium 8.9 8.9 - 10.3 mg/dL   Total Protein 7.2 6.5 - 8.1 g/dL   Albumin 3.8 3.5 - 5.0 g/dL   AST 14 (L) 15 - 41 U/L   ALT 14 0 - 44 U/L   Alkaline Phosphatase 46 38 - 126 U/L   Total Bilirubin 1.0 0.3 - 1.2 mg/dL   GFR calc non Af Amer >60 >60 mL/min   GFR calc Af Amer >60 >60 mL/min   Anion gap 7 5 - 15  CBC  Result Value Ref Range   WBC 9.2 4.0 - 10.5 K/uL   RBC 4.36 3.87 - 5.11 MIL/uL   Hemoglobin 11.8 (L) 12.0 - 15.0 g/dL   HCT 37.4 36.0 - 46.0 %   MCV 85.8 80.0 - 100.0 fL   MCH 27.1 26.0 - 34.0 pg   MCHC 31.6 30.0 - 36.0 g/dL   RDW 14.8 11.5 - 15.5 %   Platelets 342 150 - 400 K/uL   nRBC 0.0 0.0 - 0.2 %  Urinalysis, Routine w reflex microscopic  Result Value Ref Range   Color, Urine YELLOW YELLOW   APPearance CLEAR CLEAR   Specific Gravity, Urine 1.030 1.005 - 1.030   pH 5.0 5.0 - 8.0   Glucose, UA NEGATIVE NEGATIVE mg/dL   Hgb urine dipstick NEGATIVE NEGATIVE   Bilirubin Urine NEGATIVE NEGATIVE   Ketones, ur NEGATIVE NEGATIVE mg/dL   Protein, ur NEGATIVE NEGATIVE mg/dL   Nitrite NEGATIVE NEGATIVE   Leukocytes, UA  NEGATIVE NEGATIVE  Lactic acid, plasma  Result Value Ref Range   Lactic Acid, Venous 0.7 0.5 - 1.9 mmol/L  Lactic acid, plasma  Result Value Ref Range   Lactic Acid, Venous 0.8 0.5 - 1.9 mmol/L  Differential  Result Value Ref Range   Neutrophils Relative % 76 %   Neutro Abs 6.8 1.7 - 7.7 K/uL   Lymphocytes Relative 16 %   Lymphs Abs 1.5 0.7 - 4.0 K/uL   Monocytes Relative 8 %   Monocytes Absolute 0.8 0.1 - 1.0 K/uL   Eosinophils Relative 0 %   Eosinophils Absolute 0.0 0.0 - 0.5 K/uL   Basophils Relative 0 %   Basophils Absolute 0.0 0.0 - 0.1 K/uL   Immature Granulocytes 0 %   Abs Immature Granulocytes 0.03 0.00 - 0.07 K/uL   Ct Abdomen Pelvis Wo Contrast Result Date: 09/26/2018 CLINICAL DATA:  Abdominal pain, diverticulitis EXAM: CT ABDOMEN AND PELVIS WITHOUT CONTRAST TECHNIQUE: Multidetector CT imaging of the abdomen and pelvis was performed following the standard protocol without IV contrast. COMPARISON:  03/05/2017 FINDINGS: Lower chest: No acute abnormality. Hepatobiliary: No focal liver abnormality is seen. Status post cholecystectomy. No biliary dilatation. Pancreas: Unremarkable. No pancreatic ductal dilatation or surrounding inflammatory changes. Spleen: Normal in size without focal abnormality. Adrenals/Urinary Tract: Normal adrenal glands. Punctate nonobstructing right renal calculus. No left renal calculus. No ureteral calculi. Normal bladder. Stomach/Bowel: No bowel dilatation to suggest bowel obstruction. Normal appendix. Diverticulosis of the sigmoid colon with  severe surrounding inflammatory changes and mild bowel wall thickening most consistent with acute diverticulitis. No focal fluid collection to suggest an abscess. Vascular/Lymphatic: No significant vascular findings are present. No enlarged abdominal or pelvic lymph nodes. Reproductive: Status post hysterectomy. No adnexal masses. Other: No abdominal wall hernia or abnormality. No abdominopelvic ascites. Musculoskeletal:  No acute osseous abnormality. No aggressive osseous lesion. Mild osteoarthritis of bilateral sacroiliac joints. Degenerative disc disease with disc height loss at L5-S1 with bilateral facet arthropathy. IMPRESSION: 1. Acute sigmoid diverticulitis. No peridiverticular fluid collection to suggest an abscess. Electronically Signed   By: Kathreen Devoid   On: 09/26/2018 12:16   Dg Chest 2 View Result Date: 09/26/2018 CLINICAL DATA:  Left lower quadrant abdominal pain.  Fever. EXAM: CHEST - 2 VIEW COMPARISON:  Two-view chest x-ray 11/12/2017 FINDINGS: The heart size and mediastinal contours are within normal limits. Both lungs are clear. The visualized skeletal structures are unremarkable. IMPRESSION: Negative two view chest x-ray Electronically Signed   By: San Morelle M.D.   On: 09/26/2018 11:03     1535:  CT as above; IV abx started. Pt has tol PO well. Pt attempted PO pain meds while IV abx infusing: c/o increasing pain, especially with ambulation. IV fentanyl ordered, will admit. Dx and testing d/w pt.  Questions answered.  Verb understanding, agreeable to admit.  T/C returned from [facility] Dr. Wynetta Emery, case discussed, including:  HPI, pertinent PM/SHx, VS/PE, dx testing, ED course and treatment:  Agreeable to admit.      Final Clinical Impressions(s) / ED Diagnoses   Final diagnoses:  None    ED Discharge Orders    None       Francine Graven, DO 09/28/18 [id number]

## 2018-09-26 NOTE — Plan of Care (Signed)

## 2018-09-26 NOTE — ED Notes (Signed)
Tolerating water well.

## 2018-09-26 NOTE — ED Notes (Signed)
Pt was informed that we need a urine sample. Pt states that she can not urinate at this time.

## 2018-09-26 NOTE — ED Triage Notes (Signed)
Patient complains of left lower abdominal pain with fever, 101. Patient states history of diverticulosis. Admits to nausea some diarrhea, denies vomiting.

## 2018-09-26 NOTE — H&P (Addendum)
History and Physical  GIBSON TELLERIA YFV:[id number] DOB: 1966-09-25 DOA: 09/26/2018  Referring physician: Thurnell Garbe  PCP: Sharilyn Sites, MD   Chief Complaint: abdominal pain  HPI: [name] is a 52 y.o. female with crohn's disease (in remission) presented to ED with complains of relatively acute onset of left lower quadrant abdominal pain and fever up to 101 at home.  She has had some associated nausea and some diarrhea.  She denies emesis.  She reports a known history of diverticulosis and crohn's disease.  She is followed by GI.  She reports that she has not been able to eat or drink well due to abdominal pain, malaise and nausea.  She was seen in ED and noted to have LLQ tenderness on exam.  She had a CT abdomen pelvis with findings of acute sigmoid diverticulitis.  She was started on IV fluids for hydration, IV antibiotics and IV pain medications.  She was observed in the ED but unfortunately her pain could not be controlled with oral medication and she will be admitted for pain control with IV pain medications.  Her WBC noted to be normal.  She was afebrile on arrival.  She will be placed under observation care.    Review of Systems: All systems reviewed and apart from history of presenting illness, are negative.  Past Medical History:  Diagnosis Date  . Arthritis   . Asthma   . Chest pain    cath 2005 norm cors, repeat cath Feb 2011 normal cors and only minimally  elevated pulmonary pressures  . Crohn disease ([place])   . Diverticulosis   . GERD (gastroesophageal reflux disease)   . Hiatal hernia   . Hypertension   . Obesity   . Palpitations   . Sleep apnea sleep study 11/03/2009    cpap; does sometimes, doesnt use every night.   Past Surgical History:  Procedure Laterality Date  . ABDOMINAL HYSTERECTOMY    . ABDOMINAL HYSTERECTOMY    . ANKLE ARTHROSCOPY  06/01/2012   Procedure: ANKLE ARTHROSCOPY;  Surgeon: Colin Rhein, MD;  Location: [facility];   Service: Orthopedics;  Laterality: Left;  left ankle arthorsocpy with extensive debridement and gastroc slide  . CARDIAC CATHETERIZATION  11/22/2009 and 2005   WNL  . CESAREAN SECTION    . CHOLECYSTECTOMY  09/08/2006   lap. chole.  . CHONDROPLASTY  06/17/2012   Procedure: CHONDROPLASTY;  Surgeon: Carole Civil, MD;  Location: [facility];  Service: Orthopedics;  Laterality: Right;  right patella  . ESOPHAGOGASTRODUODENOSCOPY N/A 05/14/2015   Procedure: ESOPHAGOGASTRODUODENOSCOPY (EGD);  Surgeon: Rogene Houston, MD;  Location: [facility];  Service: Endoscopy;  Laterality: N/A;  730  . INGUINAL HERNIA REPAIR  10/30/2008   right  . KNEE ARTHROSCOPY WITH LATERAL MENISECTOMY Left 12/23/2016   Procedure: LEFT KNEE ARTHROSCOPY WITH LATERAL MENISECTOMY;  Surgeon: Carole Civil, MD;  Location: [facility];  Service: Orthopedics;  Laterality: Left;  . KNEE ARTHROSCOPY WITH MEDIAL MENISECTOMY Left 12/23/2016   Procedure: LEFT KNEE ARTHROSCOPY WITH MEDIAL MENISECTOMY CHONDROPLASTY PATELLA  AND MEDIAL FEMORAL CONDYLE LEFT KNEE;  Surgeon: Carole Civil, MD;  Location: [facility];  Service: Orthopedics;  Laterality: Left;  . SHOULDER ARTHROSCOPY WITH SUBACROMIAL DECOMPRESSION AND BICEP TENDON REPAIR Left 12/21/2017   Procedure: Left shoulder arthroscopic biceps tenodesis, SAD, DCR and labrum debridement;  Surgeon: Nicholes Stairs, MD;  Location: [facility];  Service: Orthopedics;  Laterality: Left;  120 mins  . SHOULDER SURGERY  right x 2    Social History:  reports that she has never smoked. She has never used smokeless tobacco. She reports that she does not drink alcohol or use drugs.  Allergies  Allergen Reactions  . Iodinated Diagnostic Agents Anaphylaxis and Hives  . Other Shortness Of Breath    Lemon grass  . Wasp Venom Anaphylaxis and Shortness Of Breath  . Pollen Extract Swelling  . Adhesive [Tape] Rash  . Hydromorphone Hcl Nausea And Vomiting  . Omnipaque [Iohexol] Hives and Nausea Only     Pt. Was premedicated with emergent protocol.    Family History  Problem Relation Age of Onset  . Hypertension Mother   . Lupus Father   . COPD Father   . Congestive Heart Failure Maternal Grandmother     Prior to Admission medications   Medication Sig Start Date End Date Taking? Authorizing Provider  acetaminophen (TYLENOL) 500 MG tablet Take 500 mg by mouth every 6 (six) hours as needed.   Yes [provider]  albuterol (PROVENTIL) (2.5 MG/3ML) 0.083% nebulizer solution Take 2.5 mg by nebulization every 6 (six) hours as needed. For shortness of breath   Yes [provider]  albuterol (VENTOLIN HFA) 108 (90 Base) MCG/ACT inhaler Inhale 1-2 puffs into the lungs every 6 (six) hours as needed for wheezing or shortness of breath.   Yes [provider]  amLODipine (NORVASC) 5 MG tablet Take 5 mg by mouth at bedtime.   Yes [provider]  budesonide-formoterol (SYMBICORT) 160-4.5 MCG/ACT inhaler Inhale 2 puffs into the lungs 2 (two) times daily.   Yes [provider]  cetirizine (ZYRTEC) 10 MG tablet Take 10 mg by mouth daily.   Yes [provider]  cyclobenzaprine (FLEXERIL) 10 MG tablet TAKE ONE TABLET BY MOUTH 3 TIMES DAILY AS NEEDED FOR MUSCLE SPASM(S). 08/18/17  Yes Carole Civil, MD  diclofenac sodium (VOLTAREN) 1 % GEL Apply 4 g topically 4 (four) times daily. 10/29/17  Yes Carole Civil, MD  EPINEPHrine (EPIPEN) 0.3 mg/0.3 mL SOAJ injection Inject 0.3 mLs (0.3 mg total) into the muscle once. 12/15/13  Yes Linton Flemings, MD  famotidine (PEPCID) 20 MG tablet Take 20 mg by mouth as needed for heartburn or indigestion.   Yes [provider]  fluticasone (FLONASE) 50 MCG/ACT nasal spray Place 1 spray into both nostrils daily.   Yes [provider]  gabapentin (NEURONTIN) 300 MG capsule TAKE 1 CAPSULE BY MOUTH ONCE DAILY AT BEDTIME 08/15/18  Yes Carole Civil, MD  ibuprofen (ADVIL,MOTRIN) 800 MG tablet Take 1  tablet (800 mg total) by mouth every 8 (eight) hours as needed. 04/13/18  Yes Carole Civil, MD  losartan (COZAAR) 100 MG tablet Take 100 mg by mouth at bedtime.    Yes [provider]  Magnesium 500 MG CAPS Take by mouth.   Yes [provider]  meclizine (ANTIVERT) 25 MG tablet Take 25 mg by mouth 3 (three) times daily as needed for dizziness.   Yes [provider]  montelukast (SINGULAIR) 10 MG tablet Take 10 mg by mouth at bedtime.   Yes [provider]  ondansetron (ZOFRAN) 4 MG tablet Take 4 mg by mouth every 8 (eight) hours as needed for nausea or vomiting.   Yes [provider]  pantoprazole (PROTONIX) 40 MG tablet TAKE (1) TABLET BY MOUTH TWICE DAILY BEFORE A MEAL. Patient taking differently: TAKE (1) TABLET BY MOUTH TWICE DAILY as needed 12/24/16  Yes Rehman, [employer],  MD  Vitamin D-Vitamin K (K2 PLUS D3 PO) Take 2 capsules by mouth daily.   Yes [provider]   Physical Exam: Vitals:   09/26/18 1330 09/26/18 1400 09/26/18 1430 09/26/18 1500  BP: 116/84 117/77 (!) 127/91 112/87  Pulse: 79 82 78 79  Resp: 16   16  Temp:      TempSrc:      SpO2: 95% 96% 95% 97%  Weight:      Height:         General exam: Moderately built and nourished patient, lying comfortably supine on the gurney in no obvious distress.  Head, eyes and ENT: Nontraumatic and normocephalic. Pupils equally reacting to light and accommodation. Oral mucosa dry.  Neck: Supple. No JVD, carotid bruit or thyromegaly.  Lymphatics: No lymphadenopathy.  Respiratory system: Clear to auscultation. No increased work of breathing.  Cardiovascular system: S1 and S2 heard, RRR. No JVD, murmurs, gallops, clicks or pedal edema.  Gastrointestinal system: Abdomen is nondistended, soft and diffuse LLQ tenderness with light palpation, No guarding. Normal bowel sounds heard. No organomegaly or masses appreciated.  Central nervous system: Alert and oriented. No focal  neurological deficits.  Extremities: Symmetric 5 x 5 power. Peripheral pulses symmetrically felt.   Skin: No rashes or acute findings.  Musculoskeletal system: Negative exam.  Psychiatry: Pleasant and cooperative.  Labs on Admission:  Basic Metabolic Panel: Recent Labs  Lab 09/26/18 1023  NA 138  K 3.7  CL 107  CO2 24  GLUCOSE 107*  BUN 11  CREATININE 0.78  CALCIUM 8.9   Liver Function Tests: Recent Labs  Lab 09/26/18 1023  AST 14*  ALT 14  ALKPHOS 46  BILITOT 1.0  PROT 7.2  ALBUMIN 3.8   Recent Labs  Lab 09/26/18 1023  LIPASE 26   No results for input(s): AMMONIA in the last 168 hours. CBC: Recent Labs  Lab 09/26/18 1017 09/26/18 1023  WBC  --  9.2  NEUTROABS 6.8  --   HGB  --  11.8*  HCT  --  37.4  MCV  --  85.8  PLT  --  342   Cardiac Enzymes: No results for input(s): CKTOTAL, CKMB, CKMBINDEX, TROPONINI in the last 168 hours.  BNP (last 3 results) No results for input(s): PROBNP in the last 8760 hours. CBG: No results for input(s): GLUCAP in the last 168 hours.  Radiological Exams on Admission: Ct Abdomen Pelvis Wo Contrast  Result Date: 09/26/2018 CLINICAL DATA:  Abdominal pain, diverticulitis EXAM: CT ABDOMEN AND PELVIS WITHOUT CONTRAST TECHNIQUE: Multidetector CT imaging of the abdomen and pelvis was performed following the standard protocol without IV contrast. COMPARISON:  03/05/2017 FINDINGS: Lower chest: No acute abnormality. Hepatobiliary: No focal liver abnormality is seen. Status post cholecystectomy. No biliary dilatation. Pancreas: Unremarkable. No pancreatic ductal dilatation or surrounding inflammatory changes. Spleen: Normal in size without focal abnormality. Adrenals/Urinary Tract: Normal adrenal glands. Punctate nonobstructing right renal calculus. No left renal calculus. No ureteral calculi. Normal bladder. Stomach/Bowel: No bowel dilatation to suggest bowel obstruction. Normal appendix. Diverticulosis of the sigmoid colon with  severe surrounding inflammatory changes and mild bowel wall thickening most consistent with acute diverticulitis. No focal fluid collection to suggest an abscess. Vascular/Lymphatic: No significant vascular findings are present. No enlarged abdominal or pelvic lymph nodes. Reproductive: Status post hysterectomy. No adnexal masses. Other: No abdominal wall hernia or abnormality. No abdominopelvic ascites. Musculoskeletal: No acute osseous abnormality. No aggressive osseous lesion. Mild osteoarthritis of bilateral sacroiliac joints. Degenerative disc disease with  disc height loss at L5-S1 with bilateral facet arthropathy. IMPRESSION: 1. Acute sigmoid diverticulitis. No peridiverticular fluid collection to suggest an abscess. Electronically Signed   By: Kathreen Devoid   On: 09/26/2018 12:16   Dg Chest 2 View  Result Date: 09/26/2018 CLINICAL DATA:  Left lower quadrant abdominal pain.  Fever. EXAM: CHEST - 2 VIEW COMPARISON:  Two-view chest x-ray 11/12/2017 FINDINGS: The heart size and mediastinal contours are within normal limits. Both lungs are clear. The visualized skeletal structures are unremarkable. IMPRESSION: Negative two view chest x-ray Electronically Signed   By: San Morelle M.D.   On: 09/26/2018 11:03   Assessment/Plan Principal Problem:   Acute diverticulitis Active Problems:   Diverticulitis of colon without hemorrhage   Nausea without vomiting   Crohn disease ([place])   LLQ abdominal pain   Acute Sigmoid diverticulitis   1. Acute sigmoid diverticulitis - no abscess seen on CT - Continue IV ciprofloxacin and metronidazole.  Try a clear liquid diet and advance as tolerated.  IV morphine ordered for moderate to severe pain as needed.  IV nausea medication ordered as needed.  IV fluids ordered for hydration and supportive care.   2. Crohn's disease - Pt says she is in remission. Followed by Dr. Laural Golden. Pt requesting courtesy GI consult with Dr. Laural Golden.  Will request in AM.  3. GERD -  IV famotidine ordered.  4. Essential hypertension - resume home medications - seems well controlled at this time.  5. OSA - reportedly does not use consistently.  6. Asthma - resumed home respiratory medications.   DVT Prophylaxis: lovenox Code Status: Full   Family Communication: patient   Disposition Plan: Home in 1-2 days when pain controlled   Time spent: 89 mins  Irwin Brakeman, MD [facility] Pager [phone]  If 7PM-7AM, please contact night-coverage [email or web address] Password [email or web address] 09/26/2018, 3:51 PM

## 2018-09-27 DIAGNOSIS — I10 Essential (primary) hypertension: Secondary | ICD-10-CM | POA: Diagnosis not present

## 2018-09-27 DIAGNOSIS — K50919 Crohn's disease, unspecified, with unspecified complications: Secondary | ICD-10-CM | POA: Diagnosis not present

## 2018-09-27 DIAGNOSIS — K5792 Diverticulitis of intestine, part unspecified, without perforation or abscess without bleeding: Secondary | ICD-10-CM | POA: Diagnosis not present

## 2018-09-27 DIAGNOSIS — R110 Nausea: Secondary | ICD-10-CM | POA: Diagnosis not present

## 2018-09-27 DIAGNOSIS — Z79899 Other long term (current) drug therapy: Secondary | ICD-10-CM | POA: Diagnosis not present

## 2018-09-27 DIAGNOSIS — R109 Unspecified abdominal pain: Secondary | ICD-10-CM

## 2018-09-27 DIAGNOSIS — J45909 Unspecified asthma, uncomplicated: Secondary | ICD-10-CM | POA: Diagnosis not present

## 2018-09-27 DIAGNOSIS — K5090 Crohn's disease, unspecified, without complications: Secondary | ICD-10-CM | POA: Diagnosis not present

## 2018-09-27 LAB — BASIC METABOLIC PANEL
Anion gap: 6 (ref 5–15)
BUN: 8 mg/dL (ref 6–20)
CO2: 24 mmol/L (ref 22–32)
Calcium: 8.3 mg/dL — ABNORMAL LOW (ref 8.9–10.3)
Chloride: 107 mmol/L (ref 98–111)
Creatinine, Ser: 0.84 mg/dL (ref 0.44–1.00)
GFR calc Af Amer: >60 mL/min (ref >60)
GFR calc non Af Amer: >60 mL/min (ref >60)
Glucose, Bld: 96 mg/dL (ref 70–99)
POTASSIUM: 3.5 mmol/L (ref 3.5–5.1)
Sodium: 137 mmol/L (ref 135–145)

## 2018-09-27 LAB — CBC
HCT: 34.6 % — ABNORMAL LOW (ref 36.0–46.0)
HEMOGLOBIN: 10.4 g/dL — AB (ref 12.0–15.0)
MCH: 26.6 pg (ref 26.0–34.0)
MCHC: 30.1 g/dL (ref 30.0–36.0)
MCV: 88.5 fL (ref 80.0–100.0)
Platelets: 286 10*3/uL (ref 150–400)
RBC: 3.91 MIL/uL (ref 3.87–5.11)
RDW: 14.8 % (ref 11.5–15.5)
WBC: 6.9 10*3/uL (ref 4.0–10.5)
nRBC: 0 % (ref 0.0–0.2)

## 2018-09-27 MED ORDER — ONDANSETRON HCL 4 MG PO TABS: 4.0000 mg | ORAL_TABLET | Freq: Three times a day (TID) | ORAL | 0 refills | Status: DC | PRN

## 2018-09-27 MED ORDER — METRONIDAZOLE 500 MG PO TABS: 500.0000 mg | ORAL_TABLET | Freq: Three times a day (TID) | ORAL | 0 refills | Status: AC

## 2018-09-27 MED ORDER — OXYCODONE-ACETAMINOPHEN 5-325 MG PO TABS: 1.0000 | ORAL_TABLET | ORAL | 0 refills | Status: DC | PRN

## 2018-09-27 MED ORDER — FLUCONAZOLE 100 MG PO TABS: 100.0000 mg | ORAL_TABLET | Freq: Every day | ORAL | 0 refills | Status: AC

## 2018-09-27 MED ORDER — CIPROFLOXACIN HCL 500 MG PO TABS: 500.0000 mg | ORAL_TABLET | Freq: Two times a day (BID) | ORAL | 0 refills | Status: AC

## 2018-09-27 NOTE — Progress Notes (Signed)
Patient's IV catheter removed and intact. Pt's IV site clean dry and intact. Discharge instructions including medications and follow up appointments were reviewed and discussed with patient. All questions were answered and no further questions at this time. Pt in stable condition and in no acute distress at time of discharge. Pt escorted by nurse tech

## 2018-09-27 NOTE — Discharge Summary (Signed)
Physician Discharge Summary  ARON NEEDLES FXT:[id number] DOB: Mar 03, 1966 DOA: 09/26/2018  PCP: Sharilyn Sites, MD  Admit date: 09/26/2018 Discharge date: 09/27/2018  Admitted From: Home Disposition: Home  Recommendations for Outpatient Follow-up:  1. Follow up with PCP in 1-2 weeks 2. Please obtain BMP/CBC in one week 3. Patient has been referred to follow-up with Dr. Laural Golden  Discharge Condition: Stable CODE STATUS: Full code Diet recommendation: Heart healthy  Brief/Interim Summary: 52 year old female with a history of Crohn's disease, presented to the emergency room with complaints of left lower quadrant abdominal pain and fever.  She also had some associated nausea and diarrhea.  She was found to have significant pain which was not controlled with oral pain medications.  CT of the abdomen pelvis indicated acute sigmoid diverticulitis.  She was admitted to the hospital, started on IV hydration and intravenous antibiotics.  She also received IV pain medications.  By the following day, the patient was feeling better.  She was able to keep down her food as well as her medications.  She felt that her pain was adequately controlled to the point that she can manage it at home.  She was requesting discharge home.  She is been transitioned to a course of oral ciprofloxacin and Flagyl.  She will follow-up with her primary gastroenterologist, Dr. Laural Golden  Discharge Diagnoses:  Principal Problem:   Acute diverticulitis Active Problems:   Diverticulitis of colon without hemorrhage   Nausea without vomiting   Crohn disease ([place])   LLQ abdominal pain   Acute Sigmoid diverticulitis    Discharge Instructions  Discharge Instructions    Diet - low sodium heart healthy   Complete by:  As directed    Increase activity slowly   Complete by:  As directed      Allergies as of 09/27/2018      Reactions   Iodinated Diagnostic Agents Anaphylaxis, Hives   Other Shortness Of Breath   Lemon grass    Wasp Venom Anaphylaxis, Shortness Of Breath   Pollen Extract Swelling   Adhesive [tape] Rash   Hydromorphone Hcl Nausea And Vomiting   Omnipaque [iohexol] Hives, Nausea Only   Pt. Was premedicated with emergent protocol.      Medication List    STOP taking these medications   ibuprofen 800 MG tablet Commonly known as:  ADVIL,MOTRIN     TAKE these medications   acetaminophen 500 MG tablet Commonly known as:  TYLENOL Take 500 mg by mouth every 6 (six) hours as needed.   VENTOLIN HFA 108 (90 Base) MCG/ACT inhaler Generic drug:  albuterol Inhale 1-2 puffs into the lungs every 6 (six) hours as needed for wheezing or shortness of breath.   albuterol (2.5 MG/3ML) 0.083% nebulizer solution Commonly known as:  PROVENTIL Take 2.5 mg by nebulization every 6 (six) hours as needed. For shortness of breath   amLODipine 5 MG tablet Commonly known as:  NORVASC Take 5 mg by mouth at bedtime.   budesonide-formoterol 160-4.5 MCG/ACT inhaler Commonly known as:  SYMBICORT Inhale 2 puffs into the lungs 2 (two) times daily.   cetirizine 10 MG tablet Commonly known as:  ZYRTEC Take 10 mg by mouth daily.   ciprofloxacin 500 MG tablet Commonly known as:  CIPRO Take 1 tablet (500 mg total) by mouth 2 (two) times daily for 12 days.   cyclobenzaprine 10 MG tablet Commonly known as:  FLEXERIL TAKE ONE TABLET BY MOUTH 3 TIMES DAILY AS NEEDED FOR MUSCLE SPASM(S).   diclofenac sodium 1 %  Gel Commonly known as:  VOLTAREN Apply 4 g topically 4 (four) times daily.   EPINEPHrine 0.3 mg/0.3 mL Soaj injection Commonly known as:  EPIPEN Inject 0.3 mLs (0.3 mg total) into the muscle once.   famotidine 20 MG tablet Commonly known as:  PEPCID Take 20 mg by mouth as needed for heartburn or indigestion.   fluconazole 100 MG tablet Commonly known as:  DIFLUCAN Take 1 tablet (100 mg total) by mouth daily for 10 days.   fluticasone 50 MCG/ACT nasal spray Commonly known as:  FLONASE Place 1 spray  into both nostrils daily.   gabapentin 300 MG capsule Commonly known as:  NEURONTIN TAKE 1 CAPSULE BY MOUTH ONCE DAILY AT BEDTIME   K2 PLUS D3 PO Take 2 capsules by mouth daily.   losartan 100 MG tablet Commonly known as:  COZAAR Take 100 mg by mouth at bedtime.   Magnesium 500 MG Caps Take by mouth.   meclizine 25 MG tablet Commonly known as:  ANTIVERT Take 25 mg by mouth 3 (three) times daily as needed for dizziness.   metroNIDAZOLE 500 MG tablet Commonly known as:  FLAGYL Take 1 tablet (500 mg total) by mouth 3 (three) times daily for 12 days.   montelukast 10 MG tablet Commonly known as:  SINGULAIR Take 10 mg by mouth at bedtime.   ondansetron 4 MG tablet Commonly known as:  ZOFRAN Take 1 tablet (4 mg total) by mouth every 8 (eight) hours as needed for nausea or vomiting.   oxyCODONE-acetaminophen 5-325 MG tablet Commonly known as:  PERCOCET Take 1 tablet by mouth every 4 (four) hours as needed for severe pain.   pantoprazole 40 MG tablet Commonly known as:  PROTONIX TAKE (1) TABLET BY MOUTH TWICE DAILY BEFORE A MEAL. What changed:  See the new instructions.      Follow-up Information    Rehman, Mechele Dawley, MD In 1 week.   Specialty:  Gastroenterology Contact information: [address], [address]  [place] [phone]          Allergies  Allergen Reactions  . Iodinated Diagnostic Agents Anaphylaxis and Hives  . Other Shortness Of Breath    Lemon grass  . Wasp Venom Anaphylaxis and Shortness Of Breath  . Pollen Extract Swelling  . Adhesive [Tape] Rash  . Hydromorphone Hcl Nausea And Vomiting  . Omnipaque [Iohexol] Hives and Nausea Only    Pt. Was premedicated with emergent protocol.    Consultations:     Procedures/Studies: Ct Abdomen Pelvis Wo Contrast  Result Date: 09/26/2018 CLINICAL DATA:  Abdominal pain, diverticulitis EXAM: CT ABDOMEN AND PELVIS WITHOUT CONTRAST TECHNIQUE: Multidetector CT imaging of the abdomen and  pelvis was performed following the standard protocol without IV contrast. COMPARISON:  03/05/2017 FINDINGS: Lower chest: No acute abnormality. Hepatobiliary: No focal liver abnormality is seen. Status post cholecystectomy. No biliary dilatation. Pancreas: Unremarkable. No pancreatic ductal dilatation or surrounding inflammatory changes. Spleen: Normal in size without focal abnormality. Adrenals/Urinary Tract: Normal adrenal glands. Punctate nonobstructing right renal calculus. No left renal calculus. No ureteral calculi. Normal bladder. Stomach/Bowel: No bowel dilatation to suggest bowel obstruction. Normal appendix. Diverticulosis of the sigmoid colon with severe surrounding inflammatory changes and mild bowel wall thickening most consistent with acute diverticulitis. No focal fluid collection to suggest an abscess. Vascular/Lymphatic: No significant vascular findings are present. No enlarged abdominal or pelvic lymph nodes. Reproductive: Status post hysterectomy. No adnexal masses. Other: No abdominal wall hernia or abnormality. No abdominopelvic ascites. Musculoskeletal: No acute  osseous abnormality. No aggressive osseous lesion. Mild osteoarthritis of bilateral sacroiliac joints. Degenerative disc disease with disc height loss at L5-S1 with bilateral facet arthropathy. IMPRESSION: 1. Acute sigmoid diverticulitis. No peridiverticular fluid collection to suggest an abscess. Electronically Signed   By: Kathreen Devoid   On: 09/26/2018 12:16   Dg Chest 2 View  Result Date: 09/26/2018 CLINICAL DATA:  Left lower quadrant abdominal pain.  Fever. EXAM: CHEST - 2 VIEW COMPARISON:  Two-view chest x-ray 11/12/2017 FINDINGS: The heart size and mediastinal contours are within normal limits. Both lungs are clear. The visualized skeletal structures are unremarkable. IMPRESSION: Negative two view chest x-ray Electronically Signed   By: San Morelle M.D.   On: 09/26/2018 11:03       Subjective: Still having some  abdominal pain, but overall feeling better.  No vomiting.  Wants to go home.  Discharge Exam: Vitals:   09/26/18 2121 09/27/18 0621 09/27/18 0955 09/27/18 1535  BP: 132/81 112/78  123/90  Pulse: 90 75  74  Resp: 18 19    Temp: 99.6 F (37.6 C) 98.6 F (37 C)  98.3 F (36.8 C)  TempSrc: Oral Oral  Oral  SpO2: 98% 97% 98% 100%  Weight:      Height:        General: Pt is alert, awake, not in acute distress Cardiovascular: RRR, S1/S2 +, no rubs, no gallops Respiratory: CTA bilaterally, no wheezing, no rhonchi Abdominal: Soft, periumbilical tenderness present, ND, bowel sounds + Extremities: no edema, no cyanosis    The results of significant diagnostics from this hospitalization (including imaging, microbiology, ancillary and laboratory) are listed below for reference.     Microbiology: No results found for this or any previous visit (from the past 240 hour(s)).   Labs: BNP (last 3 results) No results for input(s): BNP in the last 8760 hours. Basic Metabolic Panel: Recent Labs  Lab 09/26/18 1023 09/27/18 0610  NA 138 137  K 3.7 3.5  CL 107 107  CO2 24 24  GLUCOSE 107* 96  BUN 11 8  CREATININE 0.78 0.84  CALCIUM 8.9 8.3*   Liver Function Tests: Recent Labs  Lab 09/26/18 1023  AST 14*  ALT 14  ALKPHOS 46  BILITOT 1.0  PROT 7.2  ALBUMIN 3.8   Recent Labs  Lab 09/26/18 1023  LIPASE 26   No results for input(s): AMMONIA in the last 168 hours. CBC: Recent Labs  Lab 09/26/18 1017 09/26/18 1023 09/27/18 0610  WBC  --  9.2 6.9  NEUTROABS 6.8  --   --   HGB  --  11.8* 10.4*  HCT  --  37.4 34.6*  MCV  --  85.8 88.5  PLT  --  342 286   Cardiac Enzymes: No results for input(s): CKTOTAL, CKMB, CKMBINDEX, TROPONINI in the last 168 hours. BNP: Invalid input(s): POCBNP CBG: No results for input(s): GLUCAP in the last 168 hours. D-Dimer No results for input(s): DDIMER in the last 72 hours. Hgb A1c No results for input(s): HGBA1C in the last 72  hours. Lipid Profile No results for input(s): CHOL, HDL, LDLCALC, TRIG, CHOLHDL, LDLDIRECT in the last 72 hours. Thyroid function studies No results for input(s): TSH, T4TOTAL, T3FREE, THYROIDAB in the last 72 hours.  Invalid input(s): FREET3 Anemia work up No results for input(s): VITAMINB12, FOLATE, FERRITIN, TIBC, IRON, RETICCTPCT in the last 72 hours. Urinalysis    Component Value Date/Time   COLORURINE YELLOW 09/26/2018 [place] 09/26/2018 0944  LABSPEC 1.030 09/26/2018 0944   PHURINE 5.0 09/26/2018 0944   GLUCOSEU NEGATIVE 09/26/2018 0944   HGBUR NEGATIVE 09/26/2018 0944   BILIRUBINUR NEGATIVE 09/26/2018 0944   KETONESUR NEGATIVE 09/26/2018 0944   PROTEINUR NEGATIVE 09/26/2018 0944   UROBILINOGEN 0.2 01/02/2011 0845   NITRITE NEGATIVE 09/26/2018 0944   LEUKOCYTESUR NEGATIVE 09/26/2018 0944   Sepsis Labs Invalid input(s): PROCALCITONIN,  WBC,  LACTICIDVEN Microbiology No results found for this or any previous visit (from the past 240 hour(s)).   Time coordinating discharge: 62mns  SIGNED:   JKathie Dike MD  [facility] 09/27/2018, 8:51 PM Pager   If 7PM-7AM, please contact night-coverage [email or web address] Password [email or web address]

## 2018-09-27 NOTE — Plan of Care (Signed)
  Problem: Education: Goal: Knowledge of General Education information will improve Description Including pain rating scale, medication(s)/side effects and non-pharmacologic comfort measures Outcome: Adequate for Discharge   Problem: Health Behavior/Discharge Planning: Goal: Ability to manage health-related needs will improve Outcome: Adequate for Discharge   Problem: Clinical Measurements: Goal: Respiratory complications will improve Outcome: Adequate for Discharge Goal: Cardiovascular complication will be avoided Outcome: Adequate for Discharge   Problem: Coping: Goal: Level of anxiety will decrease Outcome: Adequate for Discharge   Problem: Safety: Goal: Ability to remain free from injury will improve Outcome: Adequate for Discharge   Problem: Skin Integrity: Goal: Risk for impaired skin integrity will decrease Outcome: Adequate for Discharge

## 2018-09-28 LAB — HIV ANTIBODY (ROUTINE TESTING W REFLEX): HIV Screen 4th Generation wRfx: NONREACTIVE

## 2018-10-03 ENCOUNTER — Encounter (INDEPENDENT_AMBULATORY_CARE_PROVIDER_SITE_OTHER): Payer: Self-pay

## 2018-10-03 DIAGNOSIS — R109 Unspecified abdominal pain: Secondary | ICD-10-CM | POA: Diagnosis not present

## 2018-10-03 DIAGNOSIS — K5792 Diverticulitis of intestine, part unspecified, without perforation or abscess without bleeding: Secondary | ICD-10-CM | POA: Diagnosis not present

## 2018-10-03 DIAGNOSIS — Z6831 Body mass index (BMI) 31.0-31.9, adult: Secondary | ICD-10-CM | POA: Diagnosis not present

## 2018-10-03 DIAGNOSIS — E6609 Other obesity due to excess calories: Secondary | ICD-10-CM | POA: Diagnosis not present

## 2018-10-04 ENCOUNTER — Ambulatory Visit (INDEPENDENT_AMBULATORY_CARE_PROVIDER_SITE_OTHER): Payer: 59 | Admitting: Internal Medicine

## 2018-10-04 ENCOUNTER — Encounter (INDEPENDENT_AMBULATORY_CARE_PROVIDER_SITE_OTHER): Payer: Self-pay | Admitting: Internal Medicine

## 2018-10-04 VITALS — BP 130/90 | HR 64 | Temp 97.7°F | Ht 71.5 in | Wt 227.0 lb

## 2018-10-04 DIAGNOSIS — K5732 Diverticulitis of large intestine without perforation or abscess without bleeding: Secondary | ICD-10-CM

## 2018-10-04 LAB — CBC WITH DIFFERENTIAL/PLATELET
Absolute Monocytes: 378 cells/uL (ref 200–950)
Basophils Absolute: 29 cells/uL (ref 0–200)
Basophils Relative: 0.7 %
Eosinophils Absolute: 101 cells/uL (ref 15–500)
Eosinophils Relative: 2.4 %
HCT: 39.1 % (ref 35.0–45.0)
Hemoglobin: 12.5 g/dL (ref 11.7–15.5)
Lymphs Abs: 1462 cells/uL (ref 850–3900)
MCH: 26.8 pg — ABNORMAL LOW (ref 27.0–33.0)
MCHC: 32 g/dL (ref 32.0–36.0)
MCV: 83.7 fL (ref 80.0–100.0)
MPV: 9.9 fL (ref 7.5–12.5)
Monocytes Relative: 9 %
NEUTROS PCT: 53.1 %
Neutro Abs: 2230 cells/uL (ref 1500–7800)
PLATELETS: 511 10*3/uL — AB (ref 140–400)
RBC: 4.67 10*6/uL (ref 3.80–5.10)
RDW: 13.4 % (ref 11.0–15.0)
Total Lymphocyte: 34.8 %
WBC: 4.2 10*3/uL (ref 3.8–10.8)

## 2018-10-04 NOTE — Progress Notes (Signed)
Subjective:    Patient ID: [name], female    DOB: 06-28-1966, 52 y.o.   MRN: [id number]  HPI Here today for f/u after recent short admission to [facility] for diverticulitis. Did have a fever 102.5 with her symptoms of LLQ pain.  Admitted for pain control. Covered with IV Cipro and Flagyl. X 3 rounds D/C with po Cipro and Flagyl x 12 days. CT scan on 09/26/2016 revealed: IMPRESSION: 1. Acute sigmoid diverticulitis. No peridiverticular fluid collection to suggest an abscess. Hx of diverticulitis greater than 3.  Still has some LLQ pain. She is on a very bland diet. She does have some nausea.  BMs are watery. No blood.  Hx of Crohn's disease.   Review of Systems Past Medical History:  Diagnosis Date  . Arthritis   . Asthma   . Chest pain    cath 2005 norm cors, repeat cath Feb 2011 normal cors and only minimally  elevated pulmonary pressures  . Crohn disease ([place])   . Diverticulosis   . GERD (gastroesophageal reflux disease)   . Hiatal hernia   . Hypertension   . Obesity   . Palpitations   . Sleep apnea sleep study 11/03/2009    cpap; does sometimes, doesnt use every night.    Past Surgical History:  Procedure Laterality Date  . ABDOMINAL HYSTERECTOMY    . ABDOMINAL HYSTERECTOMY    . ANKLE ARTHROSCOPY  06/01/2012   Procedure: ANKLE ARTHROSCOPY;  Surgeon: Colin Rhein, MD;  Location: [place];  Service: Orthopedics;  Laterality: Left;  left ankle arthorsocpy with extensive debridement and gastroc slide  . CARDIAC CATHETERIZATION  11/22/2009 and 2005   WNL  . CESAREAN SECTION    . CHOLECYSTECTOMY  09/08/2006   lap. chole.  . CHONDROPLASTY  06/17/2012   Procedure: CHONDROPLASTY;  Surgeon: Carole Civil, MD;  Location: [facility];  Service: Orthopedics;  Laterality: Right;  right patella  . ESOPHAGOGASTRODUODENOSCOPY N/A 05/14/2015   Procedure: ESOPHAGOGASTRODUODENOSCOPY (EGD);  Surgeon: Rogene Houston, MD;  [facility];  Service: Endoscopy;   Laterality: N/A;  730  . INGUINAL HERNIA REPAIR  10/30/2008   right  . KNEE ARTHROSCOPY WITH LATERAL MENISECTOMY Left 12/23/2016   Procedure: LEFT KNEE ARTHROSCOPY WITH LATERAL MENISECTOMY;  Surgeon: Carole Civil, MD;  Location: [facility];  Service: Orthopedics;  Laterality: Left;  . KNEE ARTHROSCOPY WITH MEDIAL MENISECTOMY Left 12/23/2016   Procedure: LEFT KNEE ARTHROSCOPY WITH MEDIAL MENISECTOMY CHONDROPLASTY PATELLA  AND MEDIAL FEMORAL CONDYLE LEFT KNEE;  Surgeon: Carole Civil, MD;  Location: [facility];  Service: Orthopedics;  Laterality: Left;  . SHOULDER ARTHROSCOPY WITH SUBACROMIAL DECOMPRESSION AND BICEP TENDON REPAIR Left 12/21/2017   Procedure: Left shoulder arthroscopic biceps tenodesis, SAD, DCR and labrum debridement;  Surgeon: Nicholes Stairs, MD;  Location: [place];  Service: Orthopedics;  Laterality: Left;  120 mins  . SHOULDER SURGERY     right x 2     Allergies  Allergen Reactions  . Iodinated Diagnostic Agents Anaphylaxis and Hives  . Other Shortness Of Breath    Lemon grass  . Wasp Venom Anaphylaxis and Shortness Of Breath  . Pollen Extract Swelling  . Adhesive [Tape] Rash  . Hydromorphone Hcl Nausea And Vomiting  . Omnipaque [Iohexol] Hives and Nausea Only    Pt. Was premedicated with emergent protocol.    Current Outpatient Medications on File Prior to Visit  Medication Sig Dispense Refill  . acetaminophen (TYLENOL) 500 MG tablet Take 500  mg by mouth every 6 (six) hours as needed.    Marland Kitchen albuterol (PROVENTIL) (2.5 MG/3ML) 0.083% nebulizer solution Take 2.5 mg by nebulization every 6 (six) hours as needed. For shortness of breath    . albuterol (VENTOLIN HFA) 108 (90 Base) MCG/ACT inhaler Inhale 1-2 puffs into the lungs every 6 (six) hours as needed for wheezing or shortness of breath.    Marland Kitchen amLODipine (NORVASC) 5 MG tablet Take 5 mg by mouth at bedtime.    . budesonide-formoterol (SYMBICORT) 160-4.5 MCG/ACT inhaler Inhale 2 puffs into the lungs 2 (two) times  daily.    . cetirizine (ZYRTEC) 10 MG tablet Take 10 mg by mouth daily.    . ciprofloxacin (CIPRO) 500 MG tablet Take 1 tablet (500 mg total) by mouth 2 (two) times daily for 12 days. 24 tablet 0  . cyclobenzaprine (FLEXERIL) 10 MG tablet TAKE ONE TABLET BY MOUTH 3 TIMES DAILY AS NEEDED FOR MUSCLE SPASM(S). 40 tablet 0  . diclofenac sodium (VOLTAREN) 1 % GEL Apply 4 g topically 4 (four) times daily. 3 Tube 5  . EPINEPHrine (EPIPEN) 0.3 mg/0.3 mL SOAJ injection Inject 0.3 mLs (0.3 mg total) into the muscle once. 1 Device 0  . famotidine (PEPCID) 20 MG tablet Take 20 mg by mouth as needed for heartburn or indigestion.    . fluconazole (DIFLUCAN) 100 MG tablet Take 1 tablet (100 mg total) by mouth daily for 10 days. 10 tablet 0  . fluticasone (FLONASE) 50 MCG/ACT nasal spray Place 1 spray into both nostrils daily.    Marland Kitchen gabapentin (NEURONTIN) 300 MG capsule TAKE 1 CAPSULE BY MOUTH ONCE DAILY AT BEDTIME 30 capsule 4  . losartan (COZAAR) 100 MG tablet Take 100 mg by mouth at bedtime.     . Magnesium 500 MG CAPS Take by mouth.    . meclizine (ANTIVERT) 25 MG tablet Take 25 mg by mouth 3 (three) times daily as needed for dizziness.    . metroNIDAZOLE (FLAGYL) 500 MG tablet Take 1 tablet (500 mg total) by mouth 3 (three) times daily for 12 days. 36 tablet 0  . montelukast (SINGULAIR) 10 MG tablet Take 10 mg by mouth at bedtime.    . ondansetron (ZOFRAN) 4 MG tablet Take 1 tablet (4 mg total) by mouth every 8 (eight) hours as needed for nausea or vomiting. 20 tablet 0  . oxyCODONE-acetaminophen (PERCOCET) 5-325 MG tablet Take 1 tablet by mouth every 4 (four) hours as needed for severe pain. 20 tablet 0  . pantoprazole (PROTONIX) 40 MG tablet TAKE (1) TABLET BY MOUTH TWICE DAILY BEFORE A MEAL. (Patient taking differently: TAKE (1) TABLET BY MOUTH TWICE DAILY as needed) 60 tablet 1  . Vitamin D-Vitamin K (K2 PLUS D3 PO) Take 2 capsules by mouth daily.     No current facility-administered medications on file  prior to visit.         Objective:   Physical Exam Blood pressure 130/90, pulse 64, temperature 97.7 F (36.5 C), height 5' 11.5" (1.816 m), weight 227 lb (103 kg). Alert and oriented. Skin warm and dry. Oral mucosa is moist.   . Sclera anicteric, conjunctivae is pink. Thyroid not enlarged. No cervical lymphadenopathy. Lungs clear. Heart regular rate and rhythm.  Abdomen is soft. Bowel sounds are positive. No hepatomegaly. No abdominal masses felt. Tenderness lower abdomen and LLQ.  No edema to lower extremities.           Assessment & Plan:  Acute Sigmoid Diverticulitis. She will continue Cipro  and Flagyl. If pain worsens or develops fever, she will go to the ED for further evaluation.  Crohn's disease. Her last colonoscopy was in 2011. Will put on a recall for 6 months. Lelon Frohlich is aware.

## 2018-10-04 NOTE — Patient Instructions (Signed)
CBC today. If pain worsens or u began to run a fever, go to the ED.

## 2018-10-11 DIAGNOSIS — R7309 Other abnormal glucose: Secondary | ICD-10-CM | POA: Diagnosis not present

## 2018-10-11 DIAGNOSIS — Z1389 Encounter for screening for other disorder: Secondary | ICD-10-CM | POA: Diagnosis not present

## 2018-10-11 DIAGNOSIS — I10 Essential (primary) hypertension: Secondary | ICD-10-CM | POA: Diagnosis not present

## 2018-10-11 DIAGNOSIS — K5732 Diverticulitis of large intestine without perforation or abscess without bleeding: Secondary | ICD-10-CM | POA: Diagnosis not present

## 2018-10-11 DIAGNOSIS — Z0001 Encounter for general adult medical examination with abnormal findings: Secondary | ICD-10-CM | POA: Diagnosis not present

## 2018-10-11 DIAGNOSIS — Z0000 Encounter for general adult medical examination without abnormal findings: Secondary | ICD-10-CM | POA: Diagnosis not present

## 2018-10-11 DIAGNOSIS — K5090 Crohn's disease, unspecified, without complications: Secondary | ICD-10-CM | POA: Diagnosis not present

## 2018-10-11 DIAGNOSIS — M1991 Primary osteoarthritis, unspecified site: Secondary | ICD-10-CM | POA: Diagnosis not present

## 2018-10-11 DIAGNOSIS — E6609 Other obesity due to excess calories: Secondary | ICD-10-CM | POA: Diagnosis not present

## 2018-10-11 DIAGNOSIS — G4733 Obstructive sleep apnea (adult) (pediatric): Secondary | ICD-10-CM | POA: Diagnosis not present

## 2018-10-11 DIAGNOSIS — J45909 Unspecified asthma, uncomplicated: Secondary | ICD-10-CM | POA: Diagnosis not present

## 2018-10-11 DIAGNOSIS — K5180 Other ulcerative colitis without complications: Secondary | ICD-10-CM | POA: Diagnosis not present

## 2018-10-11 DIAGNOSIS — Z6831 Body mass index (BMI) 31.0-31.9, adult: Secondary | ICD-10-CM | POA: Diagnosis not present

## 2018-10-11 MED FILL — metroNIDAZOLE 500 MG TABS: 500 | 7 days supply | Qty: 21 | Fill #0

## 2018-10-11 MED FILL — PANTOPRAZOLE SOD DR 40 MG T: 40 | 45 days supply | Qty: 90 | Fill #0

## 2018-10-11 MED FILL — CIPROFLOXACIN HCL 500 MG TA: 500 | 7 days supply | Qty: 14 | Fill #0

## 2018-10-11 MED FILL — MONTELUKAST SOD 10 MG TAB: 10 | 90 days supply | Qty: 90 | Fill #0

## 2018-10-12 MED FILL — GABAPENTIN 300 MG CAPSULE: 300 | 30 days supply | Qty: 30 | Fill #1

## 2018-10-13 ENCOUNTER — Telehealth (INDEPENDENT_AMBULATORY_CARE_PROVIDER_SITE_OTHER): Payer: Self-pay | Admitting: Internal Medicine

## 2018-10-13 NOTE — Telephone Encounter (Signed)
Patient called stated she is still hurting in the lower abd - please call her at [phone]

## 2018-10-13 NOTE — Telephone Encounter (Signed)
No answer. Message left. Will try again in the morning.

## 2018-10-14 NOTE — Telephone Encounter (Signed)
She did answer her phone.  She is at a Dr's office. She will call back

## 2018-10-14 NOTE — Telephone Encounter (Signed)
No answer. Message left on home phone. I also called her cell phone. Patient knows to go to the ED if pain increases

## 2018-10-31 MED FILL — AMLODIPINE BESYLATE 5 MG TA: 5 | 90 days supply | Qty: 90 | Fill #0

## 2018-10-31 MED FILL — LOSARTAN POTASSIUM 100 MG T: 100 | 30 days supply | Qty: 30 | Fill #0

## 2018-11-24 MED FILL — GABAPENTIN 300 MG CAPSULE: 300 | 30 days supply | Qty: 30 | Fill #0

## 2018-11-24 MED FILL — CYCLOBENZAPRINE HCL 10 MG T: 10 | 15 days supply | Qty: 45 | Fill #0

## 2018-12-04 MED FILL — LOSARTAN POTASSIUM 100 MG T: 100 | 30 days supply | Qty: 30 | Fill #1

## 2018-12-22 MED FILL — GABAPENTIN 300 MG CAPSULE: 300 | 30 days supply | Qty: 30 | Fill #1

## 2018-12-27 ENCOUNTER — Other Ambulatory Visit: Payer: Self-pay | Admitting: Orthopedic Surgery

## 2018-12-27 MED FILL — MONTELUKAST SOD 10 MG TAB: 10 | 90 days supply | Qty: 90 | Fill #0

## 2018-12-27 MED FILL — PANTOPRAZOLE SOD DR 40 MG T: 40 | 45 days supply | Qty: 90 | Fill #0

## 2018-12-27 MED FILL — LOSARTAN POTASSIUM 100 MG T: 100 | 30 days supply | Qty: 30 | Fill #2

## 2018-12-27 MED FILL — MELOXICAM 7.5 MG TABLET: 7.5 | 30 days supply | Qty: 60 | Fill #0

## 2019-02-02 MED FILL — LOSARTAN POTASSIUM 100 MG T: 100 | 30 days supply | Qty: 30 | Fill #3

## 2019-02-02 MED FILL — GABAPENTIN 300 MG CAPSULE: 300 | 30 days supply | Qty: 30 | Fill #2

## 2019-02-16 MED FILL — AMLODIPINE BESYLATE 5 MG TA: 5 | 90 days supply | Qty: 90 | Fill #1

## 2019-03-02 ENCOUNTER — Encounter (INDEPENDENT_AMBULATORY_CARE_PROVIDER_SITE_OTHER): Payer: Self-pay | Admitting: *Deleted

## 2019-03-06 ENCOUNTER — Other Ambulatory Visit: Payer: Self-pay | Admitting: Orthopedic Surgery

## 2019-03-06 DIAGNOSIS — M5431 Sciatica, right side: Secondary | ICD-10-CM

## 2019-03-06 MED FILL — CYCLOBENZAPRINE HCL 10 MG T: 10 | 15 days supply | Qty: 45 | Fill #0

## 2019-03-09 NOTE — Telephone Encounter (Signed)
Patient followed up on refill request for: gabapentin (NEURONTIN) 300 MG capsule [Pharmacy Med Name: GABAPENTIN 300 MG CAPSULE [place] 30 capsule  - chart indicates sent via interface 03/06/19   - please advise about refill as prescribed previously with 2 refills, or if 90-day supply which patient states her other medications are being done.     - discussed also scheduling for follow up appointment since last visit was in May of 2019. Virtual or in-person?

## 2019-03-10 MED FILL — GABAPENTIN 300 MG CAPSULE: 300 | 30 days supply | Qty: 30 | Fill #0

## 2019-03-16 ENCOUNTER — Encounter (INDEPENDENT_AMBULATORY_CARE_PROVIDER_SITE_OTHER): Payer: Self-pay | Admitting: *Deleted

## 2019-03-16 ENCOUNTER — Telehealth (INDEPENDENT_AMBULATORY_CARE_PROVIDER_SITE_OTHER): Payer: Self-pay | Admitting: *Deleted

## 2019-03-16 MED ORDER — PEG 3350-KCL-NA BICARB-NACL 420 G PO SOLR: 4000.0000 mL | Freq: Once | ORAL | 0 refills | Status: AC

## 2019-03-16 NOTE — Telephone Encounter (Signed)
Patient needs trilyte 

## 2019-03-28 ENCOUNTER — Other Ambulatory Visit (INDEPENDENT_AMBULATORY_CARE_PROVIDER_SITE_OTHER): Payer: Self-pay | Admitting: *Deleted

## 2019-03-28 DIAGNOSIS — Z1211 Encounter for screening for malignant neoplasm of colon: Secondary | ICD-10-CM

## 2019-04-05 ENCOUNTER — Telehealth (INDEPENDENT_AMBULATORY_CARE_PROVIDER_SITE_OTHER): Payer: Self-pay | Admitting: *Deleted

## 2019-04-05 NOTE — Telephone Encounter (Signed)
agree

## 2019-04-05 NOTE — Telephone Encounter (Signed)
Referring MD/PCP: golding   Procedure: tcs  Reason/Indication:  screening  Has patient had this procedure before?  Yes, 2011  If so, when, by whom and where?    Is there a family history of colon cancer?  no  Who?  What age when diagnosed?    Is patient diabetic?   no      Does patient have prosthetic heart valve or mechanical valve?  no  Do you have a pacemaker?  no  Has patient ever had endocarditis? no  Has patient had joint replacement within last 12 months?  no  Is patient constipated or do they take laxatives? no  Does patient have a history of alcohol/drug use?  no  Is patient on blood thinner such as Coumadin, Plavix and/or Aspirin? no  Medications: see epic  Allergies: see epic  Medication Adjustment per Dr Lindi Adie, NP:   Procedure date & time: 05/04/19 at 1

## 2019-04-10 MED FILL — LOSARTAN POTASSIUM 100 MG T: 100 | 30 days supply | Qty: 30 | Fill #4

## 2019-04-13 MED FILL — PEG-3350 SOLUTION: 420 | 2 days supply | Qty: 4000 | Fill #0

## 2019-04-14 ENCOUNTER — Other Ambulatory Visit: Payer: Self-pay | Admitting: Orthopedic Surgery

## 2019-04-14 DIAGNOSIS — M25562 Pain in left knee: Secondary | ICD-10-CM

## 2019-04-21 ENCOUNTER — Ambulatory Visit (HOSPITAL_COMMUNITY)
Admission: RE | Admit: 2019-04-21 | Discharge: 2019-04-21 | Disposition: A | Discharge status: Home / Self Care | Payer: PRIVATE HEALTH INSURANCE | Source: Ambulatory Visit | Attending: Orthopedic Surgery | Admitting: Orthopedic Surgery

## 2019-04-21 ENCOUNTER — Other Ambulatory Visit: Payer: Self-pay

## 2019-04-21 DIAGNOSIS — M25462 Effusion, left knee: Secondary | ICD-10-CM | POA: Diagnosis not present

## 2019-04-21 DIAGNOSIS — M1712 Unilateral primary osteoarthritis, left knee: Secondary | ICD-10-CM | POA: Diagnosis not present

## 2019-04-21 DIAGNOSIS — M25562 Pain in left knee: Secondary | ICD-10-CM | POA: Insufficient documentation

## 2019-04-21 DIAGNOSIS — S8992XA Unspecified injury of left lower leg, initial encounter: Secondary | ICD-10-CM | POA: Diagnosis not present

## 2019-04-24 ENCOUNTER — Other Ambulatory Visit (HOSPITAL_COMMUNITY)
Admission: RE | Admit: 2019-04-24 | Discharge: 2019-04-24 | Disposition: A | Discharge status: Home / Self Care | Payer: 59 | Source: Ambulatory Visit | Attending: Internal Medicine | Admitting: Internal Medicine

## 2019-04-24 ENCOUNTER — Other Ambulatory Visit: Payer: Self-pay

## 2019-04-24 DIAGNOSIS — Z1159 Encounter for screening for other viral diseases: Secondary | ICD-10-CM | POA: Insufficient documentation

## 2019-04-24 LAB — SARS CORONAVIRUS 2 (TAT 6-24 HRS): SARS Coronavirus 2: NEGATIVE

## 2019-04-26 ENCOUNTER — Ambulatory Visit (HOSPITAL_COMMUNITY)
Admission: RE | Admit: 2019-04-26 | Discharge: 2019-04-26 | Disposition: A | Discharge status: Home / Self Care | Payer: 59 | Attending: Internal Medicine | Admitting: Internal Medicine

## 2019-04-26 ENCOUNTER — Encounter (HOSPITAL_COMMUNITY): Payer: Self-pay | Admitting: *Deleted

## 2019-04-26 ENCOUNTER — Encounter (HOSPITAL_COMMUNITY)
Admission: RE | Disposition: A | Discharge status: Home / Self Care | Payer: Self-pay | Source: Home / Self Care | Attending: Internal Medicine

## 2019-04-26 ENCOUNTER — Other Ambulatory Visit: Payer: Self-pay

## 2019-04-26 DIAGNOSIS — K219 Gastro-esophageal reflux disease without esophagitis: Secondary | ICD-10-CM | POA: Diagnosis not present

## 2019-04-26 DIAGNOSIS — K5190 Ulcerative colitis, unspecified, without complications: Secondary | ICD-10-CM | POA: Diagnosis not present

## 2019-04-26 DIAGNOSIS — M1990 Unspecified osteoarthritis, unspecified site: Secondary | ICD-10-CM | POA: Insufficient documentation

## 2019-04-26 DIAGNOSIS — Z79899 Other long term (current) drug therapy: Secondary | ICD-10-CM | POA: Diagnosis not present

## 2019-04-26 DIAGNOSIS — J45909 Unspecified asthma, uncomplicated: Secondary | ICD-10-CM | POA: Insufficient documentation

## 2019-04-26 DIAGNOSIS — K5730 Diverticulosis of large intestine without perforation or abscess without bleeding: Secondary | ICD-10-CM | POA: Diagnosis not present

## 2019-04-26 DIAGNOSIS — Z885 Allergy status to narcotic agent status: Secondary | ICD-10-CM | POA: Insufficient documentation

## 2019-04-26 DIAGNOSIS — I10 Essential (primary) hypertension: Secondary | ICD-10-CM | POA: Diagnosis not present

## 2019-04-26 DIAGNOSIS — Z1211 Encounter for screening for malignant neoplasm of colon: Secondary | ICD-10-CM | POA: Diagnosis not present

## 2019-04-26 DIAGNOSIS — K633 Ulcer of intestine: Secondary | ICD-10-CM | POA: Insufficient documentation

## 2019-04-26 DIAGNOSIS — E669 Obesity, unspecified: Secondary | ICD-10-CM | POA: Diagnosis not present

## 2019-04-26 DIAGNOSIS — G4730 Sleep apnea, unspecified: Secondary | ICD-10-CM | POA: Diagnosis not present

## 2019-04-26 DIAGNOSIS — Z888 Allergy status to other drugs, medicaments and biological substances status: Secondary | ICD-10-CM | POA: Diagnosis not present

## 2019-04-26 DIAGNOSIS — K644 Residual hemorrhoidal skin tags: Secondary | ICD-10-CM | POA: Insufficient documentation

## 2019-04-26 DIAGNOSIS — Z7951 Long term (current) use of inhaled steroids: Secondary | ICD-10-CM | POA: Insufficient documentation

## 2019-04-26 HISTORY — PX: COLONOSCOPY: SHX5424

## 2019-04-26 HISTORY — PX: BIOPSY: SHX5522

## 2019-04-26 SURGERY — COLONOSCOPY
Anesthesia: Moderate Sedation

## 2019-04-26 MED ORDER — MIDAZOLAM HCL 5 MG/5ML IJ SOLN
INTRAMUSCULAR | Status: AC
  Filled 2019-04-26: qty 10

## 2019-04-26 MED ORDER — MEPERIDINE HCL 50 MG/ML IJ SOLN
INTRAMUSCULAR | Status: DC | PRN
  Administered 2019-04-26 (×3): 25 mg via INTRAVENOUS

## 2019-04-26 MED ORDER — PROMETHAZINE HCL 25 MG/ML IJ SOLN
INTRAMUSCULAR | Status: AC
  Filled 2019-04-26: qty 1

## 2019-04-26 MED ORDER — MIDAZOLAM HCL 5 MG/5ML IJ SOLN
INTRAMUSCULAR | Status: DC | PRN
  Administered 2019-04-26: 2 mg via INTRAVENOUS
  Administered 2019-04-26: 3 mg via INTRAVENOUS
  Administered 2019-04-26 (×2): 2 mg via INTRAVENOUS

## 2019-04-26 MED ORDER — MEPERIDINE HCL 50 MG/ML IJ SOLN
INTRAMUSCULAR | Status: AC
  Filled 2019-04-26: qty 1

## 2019-04-26 MED ORDER — STERILE WATER FOR IRRIGATION IR SOLN
Status: DC | PRN
  Administered 2019-04-26: 2.5 mL

## 2019-04-26 MED ORDER — SODIUM CHLORIDE 0.9% FLUSH
INTRAVENOUS | Status: AC
  Filled 2019-04-26: qty 10

## 2019-04-26 MED ORDER — SODIUM CHLORIDE 0.9 % IV SOLN
INTRAVENOUS | Status: DC
  Administered 2019-04-26: 1000 mL via INTRAVENOUS

## 2019-04-26 NOTE — Op Note (Signed)
[facility] Patient Name: [name] Procedure Date: 04/26/2019 8:16 AM MRN: [id number] Date of Birth: 1966-07-23 Attending MD: Hildred Laser , MD CSN: [id number] Age: 53 Admit Type: Outpatient Procedure:                Colonoscopy Indications:              Screening for colorectal malignant neoplasm Providers:                Hildred Laser, MD, Jeanann Lewandowsky. Sharon Seller, RN, Aram Candela Referring MD:             Halford Chessman MD, MD Medicines:                Meperidine 75 mg IV, Midazolam 9 mg IV Complications:            No immediate complications. Estimated Blood Loss:     Estimated blood loss was minimal. Procedure:                Pre-Anesthesia Assessment:                           - Prior to the procedure, a History and Physical                            was performed, and patient medications and                            allergies were reviewed. The patient's tolerance of                            previous anesthesia was also reviewed. The risks                            and benefits of the procedure and the sedation                            options and risks were discussed with the patient.                            All questions were answered, and informed consent                            was obtained. Prior Anticoagulants: The patient has                            taken no previous anticoagulant or antiplatelet                            agents. ASA Grade Assessment: II - A patient with                            mild systemic disease. After reviewing the risks  and benefits, the patient was deemed in                            satisfactory condition to undergo the procedure.                           After obtaining informed consent, the colonoscope                            was passed under direct vision. Throughout the                            procedure, the patient's blood pressure, pulse, and                     oxygen saturations were monitored continuously. The                            [id number]-[id number] ([id number]) scope was introduced through                            the anus and advanced to the the terminal ileum,                            with identification of the appendiceal orifice and                            IC valve. The colonoscopy was performed without                            difficulty. The patient tolerated the procedure                            well. The quality of the bowel preparation was                            good. The terminal ileum, ileocecal valve,                            appendiceal orifice, and rectum were photographed. Scope In: 8:47:36 AM Scope Out: 9:11:53 AM Scope Withdrawal Time: 0 hours 17 minutes 13 seconds  Total Procedure Duration: 0 hours 24 minutes 17 seconds  Findings:      The perianal and digital rectal examinations were normal.      The terminal ileum contained a few scattered non-bleeding erosions. This       was biopsied with a cold forceps for histology. The pathology specimen       was placed into Bottle Number 2.      Two localized non-bleeding erosions were found in the cecum. This was       biopsied with a cold forceps for histology. The pathology specimen was       placed into Bottle Number 1.      Multiple diverticula were found in the sigmoid colon, hepatic flexure       and ascending colon.      External hemorrhoids were found  during retroflexion. The hemorrhoids       were small. Impression:               - A few erosions in the terminal ileum. Biopsied.                           - Two erosions in the cecum. Biopsied.                           - Diverticulosis in the sigmoid colon, at the                            hepatic flexure and in the ascending colon.                           - External hemorrhoids. Moderate Sedation:      Moderate (conscious) sedation was administered by the endoscopy nurse       and  supervised by the endoscopist. The following parameters were       monitored: oxygen saturation, heart rate, blood pressure, CO2       capnography and response to care. Total physician intraservice time was       31 minutes. Recommendation:           - Patient has a contact number available for                            emergencies. The signs and symptoms of potential                            delayed complications were discussed with the                            patient. Return to normal activities tomorrow.                            Written discharge instructions were provided to the                            patient.                           - High fiber diet today.                           - Continue present medications.                           - No aspirin, ibuprofen, naproxen, or other                            non-steroidal anti-inflammatory drugs for 1 day.                           - Await pathology results.                           -  Repeat colonoscopy is recommended. The                            colonoscopy date will be determined after pathology                            results from today's exam become available for                            review. Procedure Code(s):        --- Professional ---                           [id number], Colonoscopy, flexible; with biopsy, single                            or multiple                           99153, Moderate sedation; each additional 15                            minutes intraservice time Diagnosis Code(s):        --- Professional ---                           Z12.11, Encounter for screening for malignant                            neoplasm of colon                           K63.3, Ulcer of intestine                           K64.4, Residual hemorrhoidal skin tags                           K57.30, Diverticulosis of large intestine without                            perforation or abscess without bleeding CPT  copyright 2019 American Medical Association. All rights reserved. The codes documented in this report are preliminary and upon coder review may  be revised to meet current compliance requirements. Hildred Laser, MD Hildred Laser, MD 04/26/2019 9:24:01 AM This report has been signed electronically. Number of Addenda: 0

## 2019-04-26 NOTE — H&P (Signed)
[name] is an 53 y.o. female.   Chief Complaint: Patient is here for colonoscopy. HPI: Patient is 53 year old female who is here primarily for screening colonoscopy.  Last exam was over 9 years ago.  She denies abdominal pain or rectal bleeding.  She has diarrhea maybe 3 times in a month.  Other times she is constipated.  She has a history of diverticulitis.  She has been treated multiple times.  Last session was in December 2019.  Her last colonoscopy of 2011 she had ileal erosions and biopsy suggested Crohn's disease.  She did not have colonic disease.  She has not taken any medication for this condition for at least 5 years.  She is therefore in remission. Family history is negative for CRC.  Past Medical History:  Diagnosis Date  . Arthritis   . Asthma   . Chest pain    cath 2005 norm cors, repeat cath Feb 2011 normal cors and only minimally  elevated pulmonary pressures  . Crohn disease ([place])   . Diverticulosis   . GERD (gastroesophageal reflux disease)   . Hiatal hernia   . Hypertension   . Obesity   . Palpitations   . Sleep apnea sleep study 11/03/2009    cpap; does sometimes, doesnt use every night.          History of sigmoid diverticulitis.  Past Surgical History:  Procedure Laterality Date  . ABDOMINAL HYSTERECTOMY    . ABDOMINAL HYSTERECTOMY    . ANKLE ARTHROSCOPY  06/01/2012   Procedure: ANKLE ARTHROSCOPY;  Surgeon: Colin Rhein, MD;  Location: [place];  Service: Orthopedics;  Laterality: Left;  left ankle arthorsocpy with extensive debridement and gastroc slide  . ANKLE SURGERY Left 05/11/2018  . CARDIAC CATHETERIZATION  11/22/2009 and 2005   WNL  . CESAREAN SECTION    . CHOLECYSTECTOMY  09/08/2006   lap. chole.  . CHONDROPLASTY  06/17/2012   Procedure: CHONDROPLASTY;  Surgeon: Carole Civil, MD;  Location: [facility];  Service: Orthopedics;  Laterality: Right;  right patella  . ESOPHAGOGASTRODUODENOSCOPY N/A 05/14/2015   Procedure:  ESOPHAGOGASTRODUODENOSCOPY (EGD);  Surgeon: Rogene Houston, MD;  [facility];  Service: Endoscopy;  Laterality: N/A;  730  . INGUINAL HERNIA REPAIR  10/30/2008   right  . KNEE ARTHROSCOPY WITH LATERAL MENISECTOMY Left 12/23/2016   Procedure: LEFT KNEE ARTHROSCOPY WITH LATERAL MENISECTOMY;  Surgeon: Carole Civil, MD;  Location: [facility];  Service: Orthopedics;  Laterality: Left;  . KNEE ARTHROSCOPY WITH MEDIAL MENISECTOMY Left 12/23/2016   Procedure: LEFT KNEE ARTHROSCOPY WITH MEDIAL MENISECTOMY CHONDROPLASTY PATELLA  AND MEDIAL FEMORAL CONDYLE LEFT KNEE;  Surgeon: Carole Civil, MD;  Location: [facility];  Service: Orthopedics;  Laterality: Left;  . SHOULDER ARTHROSCOPY WITH SUBACROMIAL DECOMPRESSION AND BICEP TENDON REPAIR Left 12/21/2017   Procedure: Left shoulder arthroscopic biceps tenodesis, SAD, DCR and labrum debridement;  Surgeon: Nicholes Stairs, MD;  Location: [place];  Service: Orthopedics;  Laterality: Left;  120 mins  . SHOULDER SURGERY     right x 2     Family History  Problem Relation Age of Onset  . Hypertension Mother   . Lupus Father   . COPD Father   . Congestive Heart Failure Maternal Grandmother    Social History:  reports that she has never smoked. She has never used smokeless tobacco. She reports that she does not drink alcohol or use drugs.  Allergies:  Allergies  Allergen Reactions  . Iodinated Diagnostic  Agents Anaphylaxis and Hives  . Other Shortness Of Breath    Lemon grass  . Wasp Venom Anaphylaxis and Shortness Of Breath  . Pollen Extract Swelling  . Adhesive [Tape] Rash  . Hydromorphone Hcl Nausea And Vomiting  . Omnipaque [Iohexol] Hives and Nausea Only    Pt. Was premedicated with emergent protocol.    Medications Prior to Admission  Medication Sig Dispense Refill  . acetaminophen (TYLENOL) 500 MG tablet Take 500 mg by mouth every 6 (six) hours as needed.    Marland Kitchen amLODipine (NORVASC) 5 MG tablet Take 5 mg by mouth at bedtime.     . cetirizine (ZYRTEC) 10 MG tablet Take 10 mg by mouth daily.    . cyclobenzaprine (FLEXERIL) 10 MG tablet TAKE ONE TABLET BY MOUTH 3 TIMES DAILY AS NEEDED FOR MUSCLE SPASM(S). 40 tablet 0  . gabapentin (NEURONTIN) 300 MG capsule TAKE 1 CAPSULE BY MOUTH ONCE DAILY AT BEDTIME 30 capsule 2  . losartan (COZAAR) 100 MG tablet Take 100 mg by mouth at bedtime.     . Magnesium 500 MG CAPS Take by mouth.    . montelukast (SINGULAIR) 10 MG tablet Take 10 mg by mouth at bedtime.    . Multiple Vitamin (MULTIVITAMIN) tablet Take 1 tablet by mouth daily.    . pantoprazole (PROTONIX) 40 MG tablet TAKE (1) TABLET BY MOUTH TWICE DAILY BEFORE A MEAL. (Patient taking differently: TAKE (1) TABLET BY MOUTH TWICE DAILY as needed) 60 tablet 1  . Vitamin D-Vitamin K (K2 PLUS D3 PO) Take 2 capsules by mouth daily.    Marland Kitchen albuterol (PROVENTIL) (2.5 MG/3ML) 0.083% nebulizer solution Take 2.5 mg by nebulization every 6 (six) hours as needed. For shortness of breath    . albuterol (VENTOLIN HFA) 108 (90 Base) MCG/ACT inhaler Inhale 1-2 puffs into the lungs every 6 (six) hours as needed for wheezing or shortness of breath.    . budesonide-formoterol (SYMBICORT) 160-4.5 MCG/ACT inhaler Inhale 2 puffs into the lungs 2 (two) times daily.    . diclofenac sodium (VOLTAREN) 1 % GEL Apply 4 g topically 4 (four) times daily. 3 Tube 5  . diphenhydrAMINE (BENADRYL) 25 MG tablet Take 25 mg by mouth every 6 (six) hours as needed for allergies.    Marland Kitchen EPINEPHrine (EPIPEN) 0.3 mg/0.3 mL SOAJ injection Inject 0.3 mLs (0.3 mg total) into the muscle once. 1 Device 0  . famotidine (PEPCID) 20 MG tablet Take 20 mg by mouth as needed for heartburn or indigestion.    . fluticasone (FLONASE) 50 MCG/ACT nasal spray Place 1 spray into both nostrils daily.    . meclizine (ANTIVERT) 25 MG tablet Take 25 mg by mouth 3 (three) times daily as needed for dizziness.    . meloxicam (MOBIC) 7.5 MG tablet TAKE 1 TABLET (7.5 MG TOTAL) BY MOUTH 2 (TWO) TIMES DAILY  AS NEEDED FOR PAIN/INFLAMMATION. 60 tablet 5  . ondansetron (ZOFRAN) 4 MG tablet Take 1 tablet (4 mg total) by mouth every 8 (eight) hours as needed for nausea or vomiting. 20 tablet 0  . oxyCODONE-acetaminophen (PERCOCET) 5-325 MG tablet Take 1 tablet by mouth every 4 (four) hours as needed for severe pain. 20 tablet 0    No results found for this or any previous visit (from the past 48 hour(s)). No results found.  ROS  Blood pressure 126/89, pulse 81, temperature 98.1 F (36.7 C), temperature source Oral, resp. rate 12, height 5' 11.5" (1.816 m), weight 106.6 kg, SpO2 97 %. Physical Exam  Constitutional: She appears well-developed and well-nourished.  HENT:  Mouth/Throat: Oropharynx is clear and moist.  Eyes: Conjunctivae are normal. No scleral icterus.  Neck: No thyromegaly present.  Cardiovascular: Normal rate, regular rhythm and normal heart sounds.  No murmur heard. Respiratory: Effort normal and breath sounds normal.  GI:  Abdomen is symmetrical.  It is soft and nontender with organomegaly or masses.  Musculoskeletal:        General: No edema.  Lymphadenopathy:    She has no cervical adenopathy.  Neurological: She is alert.  Skin: Skin is warm and dry.     Assessment/Plan Average risk screening colonoscopy.  Hildred Laser, MD 04/26/2019, 8:33 AM

## 2019-04-26 NOTE — Discharge Instructions (Signed)
No aspirin or NSAIDs for 24 hours. Keep meloxicam use to minimum. Resume other medications as before. High-fiber diet. No driving for 24 hours. Physician  will call with biopsy results.      Colonoscopy, Adult, Care After This sheet gives you information about how to care for yourself after your procedure. Your doctor may also give you more specific instructions. If you have problems or questions, call your doctor. What can I expect after the procedure? After the procedure, it is common to have:  A small amount of blood in your poop for 24 hours.  Some gas.  Mild cramping or bloating in your belly. Follow these instructions at home: General instructions  For the first 24 hours after the procedure: ? Do not drive or use machinery. ? Do not sign important documents. ? Do not drink alcohol. ? Do your daily activities more slowly than normal. ? Eat foods that are soft and easy to digest.  Take over-the-counter or prescription medicines only as told by your doctor. To help cramping and bloating:   Try walking around.  Put heat on your belly (abdomen) as told by your doctor. Use a heat source that your doctor recommends, such as a moist heat pack or a heating pad. ? Put a towel between your skin and the heat source. ? Leave the heat on for 20-30 minutes. ? Remove the heat if your skin turns bright red. This is especially important if you cannot feel pain, heat, or cold. You can get burned. Eating and drinking   Drink enough fluid to keep your pee (urine) clear or pale yellow.  Return to your normal diet as told by your doctor. Avoid heavy or fried foods that are hard to digest.  Avoid drinking alcohol for as long as told by your doctor. Contact a doctor if:  You have blood in your poop (stool) 2-3 days after the procedure. Get help right away if:  You have more than a small amount of blood in your poop.  You see large clumps of tissue (blood clots) in your  poop.  Your belly is swollen.  You feel sick to your stomach (nauseous).  You throw up (vomit).  You have a fever.  You have belly pain that gets worse, and medicine does not help your pain. Summary  After the procedure, it is common to have a small amount of blood in your poop. You may also have mild cramping and bloating in your belly.  For the first 24 hours after the procedure, do not drive or use machinery, do not sign important documents, and do not drink alcohol.  Get help right away if you have a lot of blood in your poop, feel sick to your stomach, have a fever, or have more belly pain. This information is not intended to replace advice given to you by your health care provider. Make sure you discuss any questions you have with your health care provider. Document Released: 10/24/2010 Document Revised: 07/22/2017 Document Reviewed: 06/15/2016 Elsevier Patient Education  [place].     High-Fiber Diet Fiber, also called dietary fiber, is a type of carbohydrate that is found in fruits, vegetables, whole grains, and beans. A high-fiber diet can have many health benefits. Your health care provider may recommend a high-fiber diet to help:  Prevent constipation. Fiber can make your bowel movements more regular.  Lower your cholesterol.  Relieve the following conditions: ? Swelling of veins in the anus (hemorrhoids). ? Swelling and irritation (  inflammation) of specific areas of the digestive tract (uncomplicated diverticulosis). ? A problem of the large intestine (colon) that sometimes causes pain and diarrhea (irritable bowel syndrome, IBS).  Prevent overeating as part of a weight-loss plan.  Prevent heart disease, type 2 diabetes, and certain cancers. What is my plan? The recommended daily fiber intake in grams (g) includes:  38 g for men age 56 or younger.  30 g for men over age 44.  63 g for women age 4 or younger.  21 g for women over age 50. You can  get the recommended daily intake of dietary fiber by:  Eating a variety of fruits, vegetables, grains, and beans.  Taking a fiber supplement, if it is not possible to get enough fiber through your diet. What do I need to know about a high-fiber diet?  It is better to get fiber through food sources rather than from fiber supplements. There is not a lot of research about how effective supplements are.  Always check the fiber content on the nutrition facts label of any prepackaged food. Look for foods that contain 5 g of fiber or more per serving.  Talk with a diet and nutrition specialist (dietitian) if you have questions about specific foods that are recommended or not recommended for your medical condition, especially if those foods are not listed below.  Gradually increase how much fiber you consume. If you increase your intake of dietary fiber too quickly, you may have bloating, cramping, or gas.  Drink plenty of water. Water helps you to digest fiber. What are tips for following this plan?  Eat a wide variety of high-fiber foods.  Make sure that half of the grains that you eat each day are whole grains.  Eat breads and cereals that are made with whole-grain flour instead of refined flour or white flour.  Eat brown rice, bulgur wheat, or millet instead of white rice.  Start the day with a breakfast that is high in fiber, such as a cereal that contains 5 g of fiber or more per serving.  Use beans in place of meat in soups, salads, and pasta dishes.  Eat high-fiber snacks, such as berries, raw vegetables, nuts, and popcorn.  Choose whole fruits and vegetables instead of processed forms like juice or sauce. What foods can I eat?  Fruits Berries. Pears. Apples. Oranges. Avocado. Prunes and raisins. Dried figs. Vegetables Sweet potatoes. Spinach. Kale. Artichokes. Cabbage. Broccoli. Cauliflower. Green peas. Carrots. Squash. Grains Whole-grain breads. Multigrain cereal. Oats and  oatmeal. Brown rice. Barley. Bulgur wheat. [place]. Quinoa. Bran muffins. Popcorn. Rye wafer crackers. Meats and other proteins Navy, kidney, and pinto beans. Soybeans. Split peas. Lentils. Nuts and seeds. Dairy Fiber-fortified yogurt. Beverages Fiber-fortified soy milk. Fiber-fortified orange juice. Other foods Fiber bars. The items listed above may not be a complete list of recommended foods and beverages. Contact a dietitian for more options. What foods are not recommended? Fruits Fruit juice. Cooked, strained fruit. Vegetables Fried potatoes. Canned vegetables. Well-cooked vegetables. Grains White bread. Pasta made with refined flour. White rice. Meats and other proteins Fatty cuts of meat. Fried chicken or fried fish. Dairy Milk. Yogurt. Cream cheese. Sour cream. Fats and oils Butters. Beverages Soft drinks. Other foods Cakes and pastries. The items listed above may not be a complete list of foods and beverages to avoid. Contact a dietitian for more information. Summary  Fiber is a type of carbohydrate. It is found in fruits, vegetables, whole grains, and beans.  There are  many health benefits of eating a high-fiber diet, such as preventing constipation, lowering blood cholesterol, helping with weight loss, and reducing your risk of heart disease, diabetes, and certain cancers.  Gradually increase your intake of fiber. Increasing too fast can result in cramping, bloating, and gas. Drink plenty of water while you increase your fiber.  The best sources of fiber include whole fruits and vegetables, whole grains, nuts, seeds, and beans. This information is not intended to replace advice given to you by your health care provider. Make sure you discuss any questions you have with your health care provider. Document Released: 09/21/2005 Document Revised: 07/26/2017 Document Reviewed: 07/26/2017 Elsevier Patient Education  2020 [employer].

## 2019-05-01 ENCOUNTER — Other Ambulatory Visit (HOSPITAL_COMMUNITY): Payer: 59

## 2019-05-03 ENCOUNTER — Encounter (HOSPITAL_COMMUNITY): Payer: Self-pay | Admitting: Internal Medicine

## 2019-05-03 MED FILL — PANTOPRAZOLE SOD DR 40 MG T: 40 | 45 days supply | Qty: 90 | Fill #1

## 2019-05-03 MED FILL — GABAPENTIN 300 MG CAPSULE: 300 | 30 days supply | Qty: 30 | Fill #1

## 2019-05-03 MED FILL — MONTELUKAST SOD 10 MG TAB: 10 | 90 days supply | Qty: 90 | Fill #1

## 2019-05-03 MED FILL — CYCLOBENZAPRINE HCL 10 MG T: 10 | 15 days supply | Qty: 45 | Fill #0

## 2019-05-09 ENCOUNTER — Other Ambulatory Visit (INDEPENDENT_AMBULATORY_CARE_PROVIDER_SITE_OTHER): Payer: Self-pay | Admitting: *Deleted

## 2019-05-09 DIAGNOSIS — K5090 Crohn's disease, unspecified, without complications: Secondary | ICD-10-CM

## 2019-05-15 ENCOUNTER — Telehealth (HOSPITAL_COMMUNITY): Payer: Self-pay | Admitting: Family Medicine

## 2019-05-15 NOTE — Telephone Encounter (Signed)
05/15/19  I left patient a message that I changed her Eval appt to 9/3 after speaking with RN that we got the W. Comp paperwork from.  Patient was originally scheduled for 9/4 but Sydnee Levans said she was having surgery 9/1, leaving hospital 9/2 and probably needed to begin therapy on 9/3... she didn't want her to get stiff.  I changed everything in Epic but left Pev a message as to why I needed to change and asked that she call if she had any questions or issues.

## 2019-05-16 DIAGNOSIS — I10 Essential (primary) hypertension: Secondary | ICD-10-CM | POA: Diagnosis not present

## 2019-05-16 DIAGNOSIS — Z6832 Body mass index (BMI) 32.0-32.9, adult: Secondary | ICD-10-CM | POA: Diagnosis not present

## 2019-05-16 DIAGNOSIS — E669 Obesity, unspecified: Secondary | ICD-10-CM | POA: Diagnosis not present

## 2019-05-16 DIAGNOSIS — M1991 Primary osteoarthritis, unspecified site: Secondary | ICD-10-CM | POA: Diagnosis not present

## 2019-05-16 MED FILL — AMLODIPINE BESYLATE 5 MG TA: 5 | 30 days supply | Qty: 30 | Fill #0

## 2019-05-16 MED FILL — LOSARTAN POTASSIUM 100 MG T: 100 | 30 days supply | Qty: 30 | Fill #5

## 2019-05-17 ENCOUNTER — Encounter (INDEPENDENT_AMBULATORY_CARE_PROVIDER_SITE_OTHER): Payer: Self-pay

## 2019-05-24 ENCOUNTER — Ambulatory Visit (HOSPITAL_COMMUNITY)
Admission: RE | Admit: 2019-05-24 | Discharge: 2019-05-24 | Disposition: A | Discharge status: Home / Self Care | Payer: 59 | Attending: Internal Medicine | Admitting: Internal Medicine

## 2019-05-24 ENCOUNTER — Encounter (HOSPITAL_COMMUNITY)
Admission: RE | Disposition: A | Discharge status: Home / Self Care | Payer: Self-pay | Source: Home / Self Care | Attending: Internal Medicine

## 2019-05-24 DIAGNOSIS — K644 Residual hemorrhoidal skin tags: Secondary | ICD-10-CM | POA: Diagnosis not present

## 2019-05-24 DIAGNOSIS — E669 Obesity, unspecified: Secondary | ICD-10-CM | POA: Insufficient documentation

## 2019-05-24 DIAGNOSIS — K2960 Other gastritis without bleeding: Secondary | ICD-10-CM | POA: Diagnosis not present

## 2019-05-24 DIAGNOSIS — M1990 Unspecified osteoarthritis, unspecified site: Secondary | ICD-10-CM | POA: Diagnosis not present

## 2019-05-24 DIAGNOSIS — K5710 Diverticulosis of small intestine without perforation or abscess without bleeding: Secondary | ICD-10-CM | POA: Insufficient documentation

## 2019-05-24 DIAGNOSIS — J45909 Unspecified asthma, uncomplicated: Secondary | ICD-10-CM | POA: Insufficient documentation

## 2019-05-24 DIAGNOSIS — K5730 Diverticulosis of large intestine without perforation or abscess without bleeding: Secondary | ICD-10-CM | POA: Diagnosis not present

## 2019-05-24 DIAGNOSIS — K5090 Crohn's disease, unspecified, without complications: Secondary | ICD-10-CM | POA: Insufficient documentation

## 2019-05-24 DIAGNOSIS — K219 Gastro-esophageal reflux disease without esophagitis: Secondary | ICD-10-CM | POA: Insufficient documentation

## 2019-05-24 DIAGNOSIS — G4730 Sleep apnea, unspecified: Secondary | ICD-10-CM | POA: Diagnosis not present

## 2019-05-24 DIAGNOSIS — I10 Essential (primary) hypertension: Secondary | ICD-10-CM | POA: Diagnosis not present

## 2019-05-24 DIAGNOSIS — Z1211 Encounter for screening for malignant neoplasm of colon: Secondary | ICD-10-CM | POA: Diagnosis not present

## 2019-05-24 DIAGNOSIS — Z8249 Family history of ischemic heart disease and other diseases of the circulatory system: Secondary | ICD-10-CM | POA: Insufficient documentation

## 2019-05-24 DIAGNOSIS — K633 Ulcer of intestine: Secondary | ICD-10-CM | POA: Diagnosis not present

## 2019-05-24 HISTORY — PX: GIVENS CAPSULE STUDY: SHX5432

## 2019-05-24 SURGERY — IMAGING PROCEDURE, GI TRACT, INTRALUMINAL, VIA CAPSULE

## 2019-05-24 NOTE — H&P (Signed)
[name] is an 53 y.o. female.   Chief Complaint: Patient is here for small bowel given capsule study HPI: Patient is 54 year old Afro-American female who was diagnosed with ileal Crohn's disease back in December 2011.  Her course has been mild.  She has been experiencing right lower quadrant abdominal pain and diarrhea.  She underwent colonoscopy about 4 weeks ago.  She had ileal erosions.  Patient reported that she had not taken NSAID for 1 year.  Biopsy did not show any granulomas.  Small bowel given capsule study recommended to examine rest of her bowel to determine the extent and severity of the disease.  That is why she is here today.  Past Medical History:  Diagnosis Date  . Arthritis   . Asthma   . Chest pain    cath 2005 norm cors, repeat cath Feb 2011 normal cors and only minimally  elevated pulmonary pressures  . Crohn disease ([place])   . Diverticulosis   . GERD (gastroesophageal reflux disease)   . Hiatal hernia   . Hypertension   . Obesity   . Palpitations   . Sleep apnea sleep study 11/03/2009    cpap; does sometimes, doesnt use every night.    Past Surgical History:  Procedure Laterality Date  . ABDOMINAL HYSTERECTOMY    . ABDOMINAL HYSTERECTOMY    . ANKLE ARTHROSCOPY  06/01/2012   Procedure: ANKLE ARTHROSCOPY;  Surgeon: Colin Rhein, MD;  Location: [facility];  Service: Orthopedics;  Laterality: Left;  left ankle arthorsocpy with extensive debridement and gastroc slide  . ANKLE SURGERY Left 05/11/2018  . BIOPSY  04/26/2019   Procedure: BIOPSY;  Surgeon: Rogene Houston, MD;  Location: [facility];  Service: Endoscopy;;  ileum colon  . CARDIAC CATHETERIZATION  11/22/2009 and 2005   WNL  . CESAREAN SECTION    . CHOLECYSTECTOMY  09/08/2006   lap. chole.  . CHONDROPLASTY  06/17/2012   Procedure: CHONDROPLASTY;  Surgeon: Carole Civil, MD;  Location: [facility];  Service: Orthopedics;  Laterality: Right;  right patella  . COLONOSCOPY N/A 04/26/2019    Procedure: COLONOSCOPY;  Surgeon: Rogene Houston, MD;  [facility];  Service: Endoscopy;  Laterality: N/A;  100  . ESOPHAGOGASTRODUODENOSCOPY N/A 05/14/2015   Procedure: ESOPHAGOGASTRODUODENOSCOPY (EGD);  Surgeon: Rogene Houston, MD;  [facility];  Service: Endoscopy;  Laterality: N/A;  730  . INGUINAL HERNIA REPAIR  10/30/2008   right  . KNEE ARTHROSCOPY WITH LATERAL MENISECTOMY Left 12/23/2016   Procedure: LEFT KNEE ARTHROSCOPY WITH LATERAL MENISECTOMY;  Surgeon: Carole Civil, MD;  Location: [facility];  Service: Orthopedics;  Laterality: Left;  . KNEE ARTHROSCOPY WITH MEDIAL MENISECTOMY Left 12/23/2016   Procedure: LEFT KNEE ARTHROSCOPY WITH MEDIAL MENISECTOMY CHONDROPLASTY PATELLA  AND MEDIAL FEMORAL CONDYLE LEFT KNEE;  Surgeon: Carole Civil, MD;  Location: [facility];  Service: Orthopedics;  Laterality: Left;  . SHOULDER ARTHROSCOPY WITH SUBACROMIAL DECOMPRESSION AND BICEP TENDON REPAIR Left 12/21/2017   Procedure: Left shoulder arthroscopic biceps tenodesis, SAD, DCR and labrum debridement;  Surgeon: Nicholes Stairs, MD;  Location: [facility];  Service: Orthopedics;  Laterality: Left;  120 mins  . SHOULDER SURGERY     right x 2     Family History  Problem Relation Age of Onset  . Hypertension Mother   . Lupus Father   . COPD Father   . Congestive Heart Failure Maternal Grandmother    Social History:  reports that she has  never smoked. She has never used smokeless tobacco. She reports that she does not drink alcohol or use drugs.  Allergies:  Allergies  Allergen Reactions  . Iodinated Diagnostic Agents Anaphylaxis and Hives  . Other Shortness Of Breath    Lemon grass  . Wasp Venom Anaphylaxis and Shortness Of Breath  . Pollen Extract Swelling  . Adhesive [Tape] Rash  . Hydromorphone Hcl Nausea And Vomiting  . Omnipaque [Iohexol] Hives and Nausea Only    Pt. Was premedicated with emergent protocol.    No medications prior to admission.    No  results found for this or any previous visit (from the past 48 hour(s)). No results found.  ROS  Height 5' 11.5" (1.816 m), weight 104.3 kg. Physical Exam   Assessment/Plan History of ileal Crohn's disease with mild course. Now with abdominal pain and diarrhea and ileoscopy only revealed few erosions. Mall bowel given capsule study being done to examine rest of her small bowel to determine extent and severity of disease before treatment considered.  Hildred Laser, MD 05/24/2019, 2:50 PM

## 2019-05-25 ENCOUNTER — Encounter (HOSPITAL_COMMUNITY): Payer: Self-pay | Admitting: Internal Medicine

## 2019-05-26 NOTE — H&P (Signed)
TOTAL KNEE ADMISSION H&P  Patient is being admitted for left total knee arthroplasty.  Subjective:  Chief Complaint:l   Left knee primary OA / pain  HPI: [name], 53 y.o. female, has a history of pain and functional disability in the left knee due to arthritis and has failed non-surgical conservative treatments for greater than 12 weeks to include NSAID's and/or analgesics, corticosteriod injections, use of assistive devices, activity modification and PRP injections.  Onset of symptoms was gradual, starting October 2018  with gradually worsening course since that time. The patient noted prior procedures on the knee to include  arthroscopy on the left knee(s).  Patient currently rates pain in the left knee(s) at 9 out of 10 with activity. Patient has night pain, worsening of pain with activity and weight bearing, pain that interferes with activities of daily living, pain with passive range of motion, crepitus and joint swelling.  Patient has evidence of periarticular osteophytes and joint space narrowing by imaging studies. There is no active infection.  Risks, benefits and expectations were discussed with the patient.  Risks including but not limited to the risk of anesthesia, blood clots, nerve damage, blood vessel damage, failure of the prosthesis, infection and up to and including death.  Patient understand the risks, benefits and expectations and wishes to proceed with surgery.   PCP: Sharilyn Sites, MD  D/C Plans:       Home   Post-op Meds:       No Rx given  Tranexamic Acid:      To be given - IV   Decadron:      Is to be given  FYI:      ASA  [place]  DME:   Pt already has equipment  PT:   OPPT arranged with PT  Pharmacy: [facility]    Patient Active Problem List   Diagnosis Date Noted  . Special screening for malignant neoplasms, colon 03/28/2019  . Acute diverticulitis 09/26/2018  . LLQ abdominal pain 09/26/2018  . Acute Sigmoid diverticulitis  09/26/2018  . Derangement of anterior horn of lateral meniscus of left knee   . Derangement of posterior horn of medial meniscus of left knee   . Chest pain 04/25/2016  . Hypokalemia 04/25/2016  . Hyperglycemia 04/25/2016  . Diverticulitis of colon 07/17/2014  . Nausea without vomiting 07/17/2014  . Crohn disease ([place]) 07/17/2014  . Diverticulitis of colon without hemorrhage 08/10/2013  . History of arthroscopy of right knee 07/25/2012  . Difficulty in walking(719.7) 06/29/2012  . Weakness of right leg 06/29/2012  . Posterior tibial tendonitis 11/11/2011  . PTTD (posterior tibial tendon dysfunction) 11/11/2011  . Knee pain, right 11/11/2011  . Chest pain 07/16/2011  . Primary osteoarthritis of left knee 02/25/2010  . PAIN IN JOINT, MULTIPLE SITES 02/25/2010  . Essential hypertension 08/13/2009  . HIATAL HERNIA 08/13/2009  . PALPITATIONS 08/13/2009  . DYSPNEA 08/13/2009   Past Medical History:  Diagnosis Date  . Arthritis   . Asthma   . Chest pain    cath 2005 norm cors, repeat cath Feb 2011 normal cors and only minimally  elevated pulmonary pressures  . Crohn disease ([place])   . Diverticulosis   . GERD (gastroesophageal reflux disease)   . Hiatal hernia   . Hypertension   . Obesity   . Palpitations   . Sleep apnea sleep study 11/03/2009    cpap; does sometimes, doesnt use every night.    Past Surgical History:  Procedure Laterality Date  .  ABDOMINAL HYSTERECTOMY    . ABDOMINAL HYSTERECTOMY    . ANKLE ARTHROSCOPY  06/01/2012   Procedure: ANKLE ARTHROSCOPY;  Surgeon: Colin Rhein, MD;  Location: [facility];  Service: Orthopedics;  Laterality: Left;  left ankle arthorsocpy with extensive debridement and gastroc slide  . ANKLE SURGERY Left 05/11/2018  . BIOPSY  04/26/2019   Procedure: BIOPSY;  Surgeon: Rogene Houston, MD;  [facility];  Service: Endoscopy;;  ileum colon  . CARDIAC CATHETERIZATION  11/22/2009 and 2005   WNL  . CESAREAN SECTION     . CHOLECYSTECTOMY  09/08/2006   lap. chole.  . CHONDROPLASTY  06/17/2012   Procedure: CHONDROPLASTY;  Surgeon: Carole Civil, MD;  Location: [facility];  Service: Orthopedics;  Laterality: Right;  right patella  . COLONOSCOPY N/A 04/26/2019   Procedure: COLONOSCOPY;  Surgeon: Rogene Houston, MD;  [facility];  Service: Endoscopy;  Laterality: N/A;  100  . ESOPHAGOGASTRODUODENOSCOPY N/A 05/14/2015   Procedure: ESOPHAGOGASTRODUODENOSCOPY (EGD);  Surgeon: Rogene Houston, MD;  [facility];  Service: Endoscopy;  Laterality: N/A;  730  . GIVENS CAPSULE STUDY N/A 05/24/2019   Procedure: GIVENS CAPSULE STUDY;  Surgeon: Rogene Houston, MD;  [facility];  Service: Endoscopy;  Laterality: N/A;  7:30  . INGUINAL HERNIA REPAIR  10/30/2008   right  . KNEE ARTHROSCOPY WITH LATERAL MENISECTOMY Left 12/23/2016   Procedure: LEFT KNEE ARTHROSCOPY WITH LATERAL MENISECTOMY;  Surgeon: Carole Civil, MD;  Location: [facility];  Service: Orthopedics;  Laterality: Left;  . KNEE ARTHROSCOPY WITH MEDIAL MENISECTOMY Left 12/23/2016   Procedure: LEFT KNEE ARTHROSCOPY WITH MEDIAL MENISECTOMY CHONDROPLASTY PATELLA  AND MEDIAL FEMORAL CONDYLE LEFT KNEE;  Surgeon: Carole Civil, MD;  Location: [facility];  Service: Orthopedics;  Laterality: Left;  . SHOULDER ARTHROSCOPY WITH SUBACROMIAL DECOMPRESSION AND BICEP TENDON REPAIR Left 12/21/2017   Procedure: Left shoulder arthroscopic biceps tenodesis, SAD, DCR and labrum debridement;  Surgeon: Nicholes Stairs, MD;  Location: [facility];  Service: Orthopedics;  Laterality: Left;  120 mins  . SHOULDER SURGERY     right x 2     No current facility-administered medications for this encounter.    Current Outpatient Medications  Medication Sig Dispense Refill Last Dose  . acetaminophen (TYLENOL) 500 MG tablet Take 500 mg by mouth every 6 (six) hours as needed.     Marland Kitchen albuterol (PROVENTIL) (2.5 MG/3ML) 0.083% nebulizer solution Take 2.5 mg by  nebulization every 6 (six) hours as needed. For shortness of breath     . albuterol (VENTOLIN HFA) 108 (90 Base) MCG/ACT inhaler Inhale 1-2 puffs into the lungs every 6 (six) hours as needed for wheezing or shortness of breath.     Marland Kitchen amLODipine (NORVASC) 5 MG tablet Take 5 mg by mouth at bedtime.     . budesonide-formoterol (SYMBICORT) 160-4.5 MCG/ACT inhaler Inhale 2 puffs into the lungs 2 (two) times daily.     . cetirizine (ZYRTEC) 10 MG tablet Take 10 mg by mouth daily.     . cyclobenzaprine (FLEXERIL) 10 MG tablet TAKE ONE TABLET BY MOUTH 3 TIMES DAILY AS NEEDED FOR MUSCLE SPASM(S). 40 tablet 0   . diclofenac sodium (VOLTAREN) 1 % GEL Apply 4 g topically 4 (four) times daily. 3 Tube 5   . diphenhydrAMINE (BENADRYL) 25 MG tablet Take 25 mg by mouth every 6 (six) hours as needed for allergies.     Marland Kitchen EPINEPHrine (EPIPEN) 0.3 mg/0.3 mL SOAJ  injection Inject 0.3 mLs (0.3 mg total) into the muscle once. 1 Device 0   . famotidine (PEPCID) 20 MG tablet Take 20 mg by mouth as needed for heartburn or indigestion.     . fluticasone (FLONASE) 50 MCG/ACT nasal spray Place 1 spray into both nostrils daily.     Marland Kitchen gabapentin (NEURONTIN) 300 MG capsule TAKE 1 CAPSULE BY MOUTH ONCE DAILY AT BEDTIME 30 capsule 2   . losartan (COZAAR) 100 MG tablet Take 100 mg by mouth at bedtime.      . Magnesium 500 MG CAPS Take by mouth.     . meclizine (ANTIVERT) 25 MG tablet Take 25 mg by mouth 3 (three) times daily as needed for dizziness.     . meloxicam (MOBIC) 7.5 MG tablet TAKE 1 TABLET (7.5 MG TOTAL) BY MOUTH 2 (TWO) TIMES DAILY AS NEEDED FOR PAIN/INFLAMMATION. 60 tablet 5   . montelukast (SINGULAIR) 10 MG tablet Take 10 mg by mouth at bedtime.     . Multiple Vitamin (MULTIVITAMIN) tablet Take 1 tablet by mouth daily.     . ondansetron (ZOFRAN) 4 MG tablet Take 1 tablet (4 mg total) by mouth every 8 (eight) hours as needed for nausea or vomiting. 20 tablet 0   . oxyCODONE-acetaminophen (PERCOCET) 5-325 MG tablet Take  1 tablet by mouth every 4 (four) hours as needed for severe pain. 20 tablet 0   . pantoprazole (PROTONIX) 40 MG tablet TAKE (1) TABLET BY MOUTH TWICE DAILY BEFORE A MEAL. (Patient taking differently: TAKE (1) TABLET BY MOUTH TWICE DAILY as needed) 60 tablet 1   . Vitamin D-Vitamin K (K2 PLUS D3 PO) Take 2 capsules by mouth daily.      Allergies  Allergen Reactions  . Iodinated Diagnostic Agents Anaphylaxis and Hives  . Other Shortness Of Breath    Lemon grass  . Wasp Venom Anaphylaxis and Shortness Of Breath  . Pollen Extract Swelling  . Adhesive [Tape] Rash  . Hydromorphone Hcl Nausea And Vomiting  . Omnipaque [Iohexol] Hives and Nausea Only    Pt. Was premedicated with emergent protocol.    Social History   Tobacco Use  . Smoking status: Never Smoker  . Smokeless tobacco: Never Used  Substance Use Topics  . Alcohol use: No    Alcohol/week: 0.0 standard drinks    Family History  Problem Relation Age of Onset  . Hypertension Mother   . Lupus Father   . COPD Father   . Congestive Heart Failure Maternal Grandmother      Review of Systems  Constitutional: Negative.   HENT: Negative.   Eyes: Negative.   Respiratory: Negative.   Cardiovascular: Negative.   Gastrointestinal: Positive for heartburn.  Genitourinary: Negative.   Musculoskeletal: Positive for joint pain.  Skin: Negative.   Neurological: Positive for headaches.  Endo/Heme/Allergies: Positive for environmental allergies.  Psychiatric/Behavioral: Negative.     Objective:  Physical Exam  Constitutional: She is oriented to person, place, and time. She appears well-developed.  HENT:  Head: Normocephalic.  Eyes: Pupils are equal, round, and reactive to light.  Neck: Neck supple. No JVD present. No tracheal deviation present. No thyromegaly present.  Cardiovascular: Normal rate, regular rhythm and intact distal pulses.  Respiratory: Effort normal and breath sounds normal. No respiratory distress. She has no  wheezes.  GI: Soft. There is no abdominal tenderness. There is no guarding.  Musculoskeletal:     Left knee: She exhibits decreased range of motion, swelling and bony tenderness. She exhibits no  ecchymosis, no deformity and no laceration. Tenderness found.  Lymphadenopathy:    She has no cervical adenopathy.  Neurological: She is alert and oriented to person, place, and time. A sensory deficit (numbness in the left due to surgery) is present.  Skin: Skin is warm and dry.  Psychiatric: She has a normal mood and affect.     Labs:   Estimated body mass index is 31.63 kg/m as calculated from the following:   Height as of 05/24/19: 5' 11.5" (1.816 m).   Weight as of 05/24/19: 104.3 kg.   Imaging Review Plain radiographs demonstrate severe degenerative joint disease of the left knee(s).The bone quality appears to be good for age and reported activity level.      Assessment/Plan:  End stage arthritis, left knee   The patient history, physical examination, clinical judgment of the provider and imaging studies are consistent with end stage degenerative joint disease of the left knee(s) and total knee arthroplasty is deemed medically necessary. The treatment options including medical management, injection therapy arthroscopy and arthroplasty were discussed at length. The risks and benefits of total knee arthroplasty were presented and reviewed. The risks due to aseptic loosening, infection, stiffness, patella tracking problems, thromboembolic complications and other imponderables were discussed. The patient acknowledged the explanation, agreed to proceed with the plan and consent was signed. Patient is being admitted for inpatient treatment for surgery, pain control, PT, OT, prophylactic antibiotics, VTE prophylaxis, progressive ambulation and ADL's and discharge planning. The patient is planning to be discharged home.    Patient's anticipated LOS is less than 2 midnights, meeting these  requirements: - Younger than 59 - Lives within 1 hour of care - Has a competent adult at home to recover with post-op recover - NO history of  - Chronic pain requiring opiods  - Diabetes  - Coronary Artery Disease  - Heart failure  - Heart attack  - Stroke  - DVT/VTE  - Cardiac arrhythmia  - Respiratory Failure/COPD  - Renal failure  - Anemia  - Advanced Liver disease    [place] Pugh. Lynsie Mcwatters   PA-C  05/26/2019, 4:24 PM

## 2019-05-31 NOTE — Patient Instructions (Addendum)
DUE TO COVID-19 ONLY ONE VISITOR IS ALLOWED TO COME WITH YOU AND STAY IN THE WAITING ROOM ONLY DURING PRE OP AND PROCEDURE. THE ONE VISITOR MAY VISIT WITH YOU IN YOUR PRIVATE ROOM DURING VISITING HOURS ONLY!!   COVID SWAB TESTING MUST BE COMPLETED ON: Friday, Aug. 28, 2020 at 3:40PM  [facility] Location.  (Must self quarantine after testing. Follow instructions on handout.)             Your procedure is scheduled on: Tuesday, Sept. 1, 2020   Report to [facility] Main  Entrance    Report to admitting at 7:30 AM   Call this number if you have problems the morning of surgery [phone]   Do not eat food:After Midnight.   May have liquids until 7:00AM day of surgery.   CLEAR LIQUID DIET  Foods Allowed                                                                     Foods Excluded  Water, Black Coffee and tea, regular and decaf                             liquids that you cannot  Plain Jell-O in any flavor  (No red)                                           see through such as: Fruit ices (not with fruit pulp)                                     milk, soups, orange juice  Iced Popsicles (No red)                                    All solid food Carbonated beverages, regular and diet                                    Apple juices Sports drinks like Gatorade (No red) Lightly seasoned clear broth or consume(fat free) Sugar, honey syrup  Sample Menu Breakfast                                Lunch                                     Supper Cranberry juice                    Beef broth                            Chicken broth Jell-O  Grape juice                           Apple juice Coffee or tea                        Jell-O                                      Popsicle                                                Coffee or tea                        Coffee or tea   Complete one Ensure drink the morning of surgery at 7:00AM the day of  surgery.   Brush your teeth the morning of surgery.   Do NOT smoke after Midnight   Take these medicines the morning of surgery with A SIP OF WATER: NONE                               You may not have any metal on your body including hair pins, jewelry, and body piercings             Do not wear make-up, lotions, powders, perfumes/cologne, or deodorant             Do not wear nail polish.  Do not shave  48 hours prior to surgery.               Do not bring valuables to the hospital. [place].   Contacts, dentures or bridgework may not be worn into surgery.   Bring small overnight bag day of surgery.    Special Instructions: Bring a copy of your healthcare power of attorney and living will documents         the day of surgery if you haven't scanned them in before.              Please read over the following fact sheets you were given:  [facility] - Preparing for Surgery Before surgery, you can play an important role.  Because skin is not sterile, your skin needs to be as free of germs as possible.  You can reduce the number of germs on your skin by washing with CHG (chlorahexidine gluconate) soap before surgery.  CHG is an antiseptic cleaner which kills germs and bonds with the skin to continue killing germs even after washing. Please DO NOT use if you have an allergy to CHG or antibacterial soaps.  If your skin becomes reddened/irritated stop using the CHG and inform your nurse when you arrive at Short Stay. Do not shave (including legs and underarms) for at least 48 hours prior to the first CHG shower.  You may shave your face/neck.  Please follow these instructions carefully:  1.  Shower with CHG Soap the night before surgery and the  morning of surgery.  2.  If you choose to wash your hair,  wash your hair first as usual with your normal  shampoo.  3.  After you shampoo, rinse your hair and body thoroughly to remove the shampoo.                              4.  Use CHG as you would any other liquid soap.  You can apply chg directly to the skin and wash.  Gently with a scrungie or clean washcloth.  5.  Apply the CHG Soap to your body ONLY FROM THE NECK DOWN.   Do   not use on face/ open                           Wound or open sores. Avoid contact with eyes, ears mouth and   genitals (private parts).                       Wash face,  Genitals (private parts) with your normal soap.             6.  Wash thoroughly, paying special attention to the area where your    surgery  will be performed.  7.  Thoroughly rinse your body with warm water from the neck down.  8.  DO NOT shower/wash with your normal soap after using and rinsing off the CHG Soap.                9.  Pat yourself dry with a clean towel.            10.  Wear clean pajamas.            11.  Place clean sheets on your bed the night of your first shower and do not  sleep with pets. Day of Surgery : Do not apply any lotions/deodorants the morning of surgery.  Please wear clean clothes to the hospital/surgery center.  FAILURE TO FOLLOW THESE INSTRUCTIONS MAY RESULT IN THE CANCELLATION OF YOUR SURGERY  PATIENT SIGNATURE_________________________________  NURSE SIGNATURE__________________________________  ________________________________________________________________________   [name]  An incentive spirometer is a tool that can help keep your lungs clear and active. This tool measures how well you are filling your lungs with each breath. Taking long deep breaths may help reverse or decrease the chance of developing breathing (pulmonary) problems (especially infection) following:  A long period of time when you are unable to move or be active. BEFORE THE PROCEDURE   If the spirometer includes an indicator to show your best effort, your nurse or respiratory therapist will set it to a desired goal.  If possible, sit up straight or lean slightly forward.  Try not to slouch.  Hold the incentive spirometer in an upright position. INSTRUCTIONS FOR USE  1. Sit on the edge of your bed if possible, or sit up as far as you can in bed or on a chair. 2. Hold the incentive spirometer in an upright position. 3. Breathe out normally. 4. Place the mouthpiece in your mouth and seal your lips tightly around it. 5. Breathe in slowly and as deeply as possible, raising the piston or the ball toward the top of the column. 6. Hold your breath for 3-5 seconds or for as long as possible. Allow the piston or ball to fall to the bottom of the column. 7. Remove the mouthpiece from your mouth and breathe out normally. 8. Rest for  a few seconds and repeat Steps 1 through 7 at least 10 times every 1-2 hours when you are awake. Take your time and take a few normal breaths between deep breaths. 9. The spirometer may include an indicator to show your best effort. Use the indicator as a goal to work toward during each repetition. 10. After each set of 10 deep breaths, practice coughing to be sure your lungs are clear. If you have an incision (the cut made at the time of surgery), support your incision when coughing by placing a pillow or rolled up towels firmly against it. Once you are able to get out of bed, walk around indoors and cough well. You may stop using the incentive spirometer when instructed by your caregiver.  RISKS AND COMPLICATIONS  Take your time so you do not get dizzy or light-headed.  If you are in pain, you may need to take or ask for pain medication before doing incentive spirometry. It is harder to take a deep breath if you are having pain. AFTER USE  Rest and breathe slowly and easily.  It can be helpful to keep track of a log of your progress. Your caregiver can provide you with a simple table to help with this. If you are using the spirometer at home, follow these instructions: [place] IF:   You are having difficultly using the  spirometer.  You have trouble using the spirometer as often as instructed.  Your pain medication is not giving enough relief while using the spirometer.  You develop fever of 100.5 F (38.1 C) or higher. SEEK IMMEDIATE MEDICAL CARE IF:   You cough up bloody sputum that had not been present before.  You develop fever of 102 F (38.9 C) or greater.  You develop worsening pain at or near the incision site. MAKE SURE YOU:   Understand these instructions.  Will watch your condition.  Will get help right away if you are not doing well or get worse. Document Released: 02/01/2007 Document Revised: 12/14/2011 Document Reviewed: 04/04/2007 ExitCare Patient Information 2014 ExitCare, [place].   ________________________________________________________________________  WHAT IS A BLOOD TRANSFUSION? Blood Transfusion Information  A transfusion is the replacement of blood or some of its parts. Blood is made up of multiple cells which provide different functions.  Red blood cells carry oxygen and are used for blood loss replacement.  White blood cells fight against infection.  Platelets control bleeding.  Plasma helps clot blood.  Other blood products are available for specialized needs, such as hemophilia or other clotting disorders. BEFORE THE TRANSFUSION  Who gives blood for transfusions?   Healthy volunteers who are fully evaluated to make sure their blood is safe. This is blood bank blood. Transfusion therapy is the safest it has ever been in the practice of medicine. Before blood is taken from a donor, a complete history is taken to make sure that person has no history of diseases nor engages in risky social behavior (examples are intravenous drug use or sexual activity with multiple partners). The donor's travel history is screened to minimize risk of transmitting infections, such as malaria. The donated blood is tested for signs of infectious diseases, such as HIV and hepatitis.  The blood is then tested to be sure it is compatible with you in order to minimize the chance of a transfusion reaction. If you or a relative donates blood, this is often done in anticipation of surgery and is not appropriate for emergency situations. It takes many days  to process the donated blood. RISKS AND COMPLICATIONS Although transfusion therapy is very safe and saves many lives, the main dangers of transfusion include:   Getting an infectious disease.  Developing a transfusion reaction. This is an allergic reaction to something in the blood you were given. Every precaution is taken to prevent this. The decision to have a blood transfusion has been considered carefully by your caregiver before blood is given. Blood is not given unless the benefits outweigh the risks. AFTER THE TRANSFUSION  Right after receiving a blood transfusion, you will usually feel much better and more energetic. This is especially true if your red blood cells have gotten low (anemic). The transfusion raises the level of the red blood cells which carry oxygen, and this usually causes an energy increase.  The nurse administering the transfusion will monitor you carefully for complications. HOME CARE INSTRUCTIONS  No special instructions are needed after a transfusion. You may find your energy is better. Speak with your caregiver about any limitations on activity for underlying diseases you may have. SEEK MEDICAL CARE IF:   Your condition is not improving after your transfusion.  You develop redness or irritation at the intravenous (IV) site. SEEK IMMEDIATE MEDICAL CARE IF:  Any of the following symptoms occur over the next 12 hours:  Shaking chills.  You have a temperature by mouth above 102 F (38.9 C), not controlled by medicine.  Chest, back, or muscle pain.  People around you feel you are not acting correctly or are confused.  Shortness of breath or difficulty breathing.  Dizziness and fainting.  You  get a rash or develop hives.  You have a decrease in urine output.  Your urine turns a dark color or changes to pink, red, or brown. Any of the following symptoms occur over the next 10 days:  You have a temperature by mouth above 102 F (38.9 C), not controlled by medicine.  Shortness of breath.  Weakness after normal activity.  The white part of the eye turns yellow (jaundice).  You have a decrease in the amount of urine or are urinating less often.  Your urine turns a dark color or changes to pink, red, or brown. Document Released: 09/18/2000 Document Revised: 12/14/2011 Document Reviewed: 05/07/2008 [facility] Patient Information 2014 [place], [place].  _______________________________________________________________________

## 2019-06-01 ENCOUNTER — Encounter (HOSPITAL_COMMUNITY): Payer: Self-pay

## 2019-06-01 ENCOUNTER — Encounter (HOSPITAL_COMMUNITY)
Admission: RE | Admit: 2019-06-01 | Discharge: 2019-06-01 | Disposition: A | Discharge status: Home / Self Care | Payer: PRIVATE HEALTH INSURANCE | Source: Ambulatory Visit | Attending: Orthopedic Surgery | Admitting: Orthopedic Surgery

## 2019-06-01 ENCOUNTER — Other Ambulatory Visit: Payer: Self-pay

## 2019-06-01 DIAGNOSIS — M1712 Unilateral primary osteoarthritis, left knee: Secondary | ICD-10-CM | POA: Insufficient documentation

## 2019-06-01 DIAGNOSIS — Z01812 Encounter for preprocedural laboratory examination: Secondary | ICD-10-CM | POA: Diagnosis present

## 2019-06-01 HISTORY — DX: Dysplasia of cervix uteri, unspecified: N87.9

## 2019-06-01 HISTORY — DX: Personal history of diseases of the blood and blood-forming organs and certain disorders involving the immune mechanism: Z86.2

## 2019-06-01 HISTORY — DX: Migraine, unspecified, not intractable, without status migrainosus: G43.909

## 2019-06-01 HISTORY — DX: Other specified postprocedural states: Z98.890

## 2019-06-01 HISTORY — DX: Nausea with vomiting, unspecified: R11.2

## 2019-06-01 HISTORY — DX: Anxiety disorder, unspecified: F41.9

## 2019-06-01 LAB — BASIC METABOLIC PANEL
Anion gap: 6 (ref 5–15)
BUN: 13 mg/dL (ref 6–20)
CO2: 27 mmol/L (ref 22–32)
Calcium: 9.2 mg/dL (ref 8.9–10.3)
Chloride: 107 mmol/L (ref 98–111)
Creatinine, Ser: 0.82 mg/dL (ref 0.44–1.00)
GFR calc Af Amer: >60 mL/min (ref >60)
GFR calc non Af Amer: >60 mL/min (ref >60)
Glucose, Bld: 110 mg/dL — ABNORMAL HIGH (ref 70–99)
Potassium: 4.1 mmol/L (ref 3.5–5.1)
Sodium: 140 mmol/L (ref 135–145)

## 2019-06-01 LAB — SURGICAL PCR SCREEN
MRSA, PCR: NEGATIVE
Staphylococcus aureus: NEGATIVE

## 2019-06-01 LAB — CBC
HCT: 41.2 % (ref 36.0–46.0)
Hemoglobin: 12.9 g/dL (ref 12.0–15.0)
MCH: 28.4 pg (ref 26.0–34.0)
MCHC: 31.3 g/dL (ref 30.0–36.0)
MCV: 90.5 fL (ref 80.0–100.0)
Platelets: 339 10*3/uL (ref 150–400)
RBC: 4.55 MIL/uL (ref 3.87–5.11)
RDW: 14.6 % (ref 11.5–15.5)
WBC: 4 10*3/uL (ref 4.0–10.5)
nRBC: 0 % (ref 0.0–0.2)

## 2019-06-01 LAB — ABO/RH: ABO/RH(D): O POS

## 2019-06-01 NOTE — Progress Notes (Signed)
PCP - Dr. Sharilyn Sites [facility] Medical Cardiologist - Dr. Haroldine Laws  Chest x-ray - N/A EKG - 06/01/2019 in epic Stress Test - N/A ECHO - N/A Cardiac Cath - N/A  Sleep Study - Performed at [facility] note in epic 11/12/2009 CPAP - not currently using CPAP machine  Fasting Blood Sugar - N/A Checks Blood Sugar _____ times a day  Blood Thinner Instructions:N/A Aspirin Instructions: Last Dose:  Anesthesia review: N/A  Patient denies shortness of breath, fever, cough and chest pain at PAT appointment   Patient verbalized understanding of instructions that were given to them at the PAT appointment. Patient was also instructed that they will need to review over the PAT instructions again at home before surgery.

## 2019-06-01 NOTE — Progress Notes (Signed)
SPOKE W/  [name]     SCREENING SYMPTOMS OF COVID 19:   COUGH--NO  RUNNY NOSE--- NO  SORE THROAT---NO  NASAL CONGESTION----NO  SNEEZING----NO  SHORTNESS OF BREATH---NO  DIFFICULTY BREATHING---NO  TEMP >100.0 -----NO  UNEXPLAINED BODY ACHES------NO  CHILLS -------- NO  HEADACHES ---------NO  LOSS OF SMELL/ TASTE --------NO    HAVE YOU OR ANY FAMILY MEMBER TRAVELLED PAST 14 DAYS OUT OF THE   COUNTY---Lives in [facility] STATE----NO COUNTRY----NO  HAVE YOU OR ANY FAMILY MEMBER BEEN EXPOSED TO ANYONE WITH COVID 19? NO

## 2019-06-02 ENCOUNTER — Other Ambulatory Visit (HOSPITAL_COMMUNITY)
Admission: RE | Admit: 2019-06-02 | Discharge: 2019-06-02 | Disposition: A | Discharge status: Home / Self Care | Payer: 59 | Source: Ambulatory Visit | Attending: Orthopedic Surgery | Admitting: Orthopedic Surgery

## 2019-06-02 ENCOUNTER — Other Ambulatory Visit (INDEPENDENT_AMBULATORY_CARE_PROVIDER_SITE_OTHER): Payer: Self-pay | Admitting: Internal Medicine

## 2019-06-02 DIAGNOSIS — Z01812 Encounter for preprocedural laboratory examination: Secondary | ICD-10-CM | POA: Diagnosis not present

## 2019-06-02 DIAGNOSIS — K5000 Crohn's disease of small intestine without complications: Secondary | ICD-10-CM | POA: Diagnosis not present

## 2019-06-02 DIAGNOSIS — K5710 Diverticulosis of small intestine without perforation or abscess without bleeding: Secondary | ICD-10-CM | POA: Diagnosis not present

## 2019-06-02 DIAGNOSIS — Z20828 Contact with and (suspected) exposure to other viral communicable diseases: Secondary | ICD-10-CM | POA: Insufficient documentation

## 2019-06-02 DIAGNOSIS — K3189 Other diseases of stomach and duodenum: Secondary | ICD-10-CM | POA: Diagnosis not present

## 2019-06-02 LAB — SARS CORONAVIRUS 2 (TAT 6-24 HRS): SARS Coronavirus 2: NEGATIVE

## 2019-06-02 MED ORDER — FAMOTIDINE 40 MG PO TABS: 40.0000 mg | ORAL_TABLET | Freq: Every day | ORAL | 1 refills | Status: DC

## 2019-06-02 MED ORDER — BUDESONIDE 3 MG PO CPEP: 3.0000 mg | ORAL_CAPSULE | Freq: Every day | ORAL | 0 refills | Status: DC

## 2019-06-02 MED FILL — BUDESONIDE 3 MG CAP: 3 | 42 days supply | Qty: 90 | Fill #0

## 2019-06-02 MED FILL — FAMOTIDINE 40 MG TABLET: 40 | 30 days supply | Qty: 30 | Fill #0

## 2019-06-02 NOTE — Op Note (Signed)
Small Bowel Givens Capsule Study Procedure date: May 24, 2019.  Referring Provider:  PCP:  Dr. Sharilyn Sites, MD  Indication for procedure:   Patient is 53 year old Afro-American female who was diagnosed with ileal Crohn's disease back in December 2011.  Her course has been 1 of mild illness.  She presented with right lower quadrant abdominal pain and diarrhea.  She underwent colonoscopy on April 26, 2019.  She was noted to have ileal erosions.  Biopsy revealed nonspecific inflammation without granulomas.  Small bowel given capsule study recommended to examine rest of her small bowel to determine if she has more extensive disease.     Findings:   Patient was able to swallow given capsule without any difficulty. Study duration 8 hours 23 minutes and 30 seconds. There is complete as capsule reached large bowel. There is prepyloric erythema and edema with few petechiae but no active bleeding noted. Jejunal diverticulum noted; best seen on image at 02:27:39. Careful examination of rest of her small bowel did not reveal any ulcers or erosions.  First Gastric image: 1 minute and 39 seconds First Duodenal image: 2 hours 3 minutes and 37 seconds First Ileo-Cecal Valve image: 5 hours 35 minutes and 6 seconds First Cecal image: 5 hours 35 minutes and 17 seconds Gastric Passage time: 2 hours 2 minutes and 58 seconds Small Bowel Passage time: 3 hours and 32 minutes.  Summary & Recommendations:  Prepyloric gastritis. Single jejunal diverticulum. No erosions or other abnormalities noted involving distal small bowel mucosa. Please note patient's colonoscopy was about 5 weeks ago revealing ileal erosions. Findings reviewed with patient. Patient begun on famotidine 40 mg p.o. nightly and budesonide 9 mg daily for 2 weeks followed by 6 mg daily for 2 weeks and then 3 mg daily for 2 weeks. Patient advised to call office with progress report in 4 to 5 weeks.

## 2019-06-05 MED FILL — GABAPENTIN 300 MG CAPSULE: 300 | 30 days supply | Qty: 30 | Fill #2

## 2019-06-06 ENCOUNTER — Observation Stay (HOSPITAL_COMMUNITY)
Admission: RE | Admit: 2019-06-06 | Discharge: 2019-06-07 | Disposition: A | Discharge status: Home / Self Care | Payer: PRIVATE HEALTH INSURANCE | Attending: Orthopedic Surgery | Admitting: Orthopedic Surgery

## 2019-06-06 ENCOUNTER — Ambulatory Visit (HOSPITAL_COMMUNITY): Payer: PRIVATE HEALTH INSURANCE | Admitting: Anesthesiology

## 2019-06-06 ENCOUNTER — Encounter (HOSPITAL_COMMUNITY)
Admission: RE | Disposition: A | Discharge status: Home / Self Care | Payer: Self-pay | Source: Home / Self Care | Attending: Orthopedic Surgery

## 2019-06-06 ENCOUNTER — Encounter (HOSPITAL_COMMUNITY): Payer: Self-pay | Admitting: *Deleted

## 2019-06-06 ENCOUNTER — Other Ambulatory Visit: Payer: Self-pay

## 2019-06-06 ENCOUNTER — Ambulatory Visit (HOSPITAL_COMMUNITY): Payer: PRIVATE HEALTH INSURANCE | Admitting: Physician Assistant

## 2019-06-06 DIAGNOSIS — Z91048 Other nonmedicinal substance allergy status: Secondary | ICD-10-CM | POA: Diagnosis not present

## 2019-06-06 DIAGNOSIS — K449 Diaphragmatic hernia without obstruction or gangrene: Secondary | ICD-10-CM | POA: Diagnosis not present

## 2019-06-06 DIAGNOSIS — Z825 Family history of asthma and other chronic lower respiratory diseases: Secondary | ICD-10-CM | POA: Insufficient documentation

## 2019-06-06 DIAGNOSIS — Z91030 Bee allergy status: Secondary | ICD-10-CM | POA: Diagnosis not present

## 2019-06-06 DIAGNOSIS — G4730 Sleep apnea, unspecified: Secondary | ICD-10-CM | POA: Insufficient documentation

## 2019-06-06 DIAGNOSIS — Z888 Allergy status to other drugs, medicaments and biological substances status: Secondary | ICD-10-CM | POA: Diagnosis not present

## 2019-06-06 DIAGNOSIS — Z8489 Family history of other specified conditions: Secondary | ICD-10-CM | POA: Insufficient documentation

## 2019-06-06 DIAGNOSIS — J449 Chronic obstructive pulmonary disease, unspecified: Secondary | ICD-10-CM | POA: Diagnosis not present

## 2019-06-06 DIAGNOSIS — Z9049 Acquired absence of other specified parts of digestive tract: Secondary | ICD-10-CM | POA: Insufficient documentation

## 2019-06-06 DIAGNOSIS — Z91041 Radiographic dye allergy status: Secondary | ICD-10-CM | POA: Insufficient documentation

## 2019-06-06 DIAGNOSIS — Z9102 Food additives allergy status: Secondary | ICD-10-CM | POA: Insufficient documentation

## 2019-06-06 DIAGNOSIS — Z6833 Body mass index (BMI) 33.0-33.9, adult: Secondary | ICD-10-CM | POA: Insufficient documentation

## 2019-06-06 DIAGNOSIS — F419 Anxiety disorder, unspecified: Secondary | ICD-10-CM | POA: Diagnosis not present

## 2019-06-06 DIAGNOSIS — M1990 Unspecified osteoarthritis, unspecified site: Secondary | ICD-10-CM | POA: Insufficient documentation

## 2019-06-06 DIAGNOSIS — K5090 Crohn's disease, unspecified, without complications: Secondary | ICD-10-CM | POA: Diagnosis not present

## 2019-06-06 DIAGNOSIS — R002 Palpitations: Secondary | ICD-10-CM | POA: Insufficient documentation

## 2019-06-06 DIAGNOSIS — Z79899 Other long term (current) drug therapy: Secondary | ICD-10-CM | POA: Insufficient documentation

## 2019-06-06 DIAGNOSIS — K219 Gastro-esophageal reflux disease without esophagitis: Secondary | ICD-10-CM | POA: Diagnosis not present

## 2019-06-06 DIAGNOSIS — Z7951 Long term (current) use of inhaled steroids: Secondary | ICD-10-CM | POA: Diagnosis not present

## 2019-06-06 DIAGNOSIS — R51 Headache: Secondary | ICD-10-CM | POA: Diagnosis not present

## 2019-06-06 DIAGNOSIS — M1712 Unilateral primary osteoarthritis, left knee: Secondary | ICD-10-CM | POA: Diagnosis present

## 2019-06-06 DIAGNOSIS — Z90710 Acquired absence of both cervix and uterus: Secondary | ICD-10-CM | POA: Insufficient documentation

## 2019-06-06 DIAGNOSIS — Z8249 Family history of ischemic heart disease and other diseases of the circulatory system: Secondary | ICD-10-CM | POA: Insufficient documentation

## 2019-06-06 DIAGNOSIS — Z96652 Presence of left artificial knee joint: Secondary | ICD-10-CM

## 2019-06-06 HISTORY — PX: TOTAL KNEE ARTHROPLASTY: SHX125

## 2019-06-06 LAB — TYPE AND SCREEN
ABO/RH(D): O POS
Antibody Screen: NEGATIVE

## 2019-06-06 SURGERY — ARTHROPLASTY, KNEE, TOTAL
Anesthesia: Spinal | Laterality: Left

## 2019-06-06 MED ORDER — PROPOFOL 10 MG/ML IV BOLUS
INTRAVENOUS | Status: AC
  Filled 2019-06-06: qty 20

## 2019-06-06 MED ORDER — FENTANYL CITRATE (PF) 100 MCG/2ML IJ SOLN
INTRAMUSCULAR | Status: DC | PRN
  Administered 2019-06-06 (×2): 50 ug via INTRAVENOUS

## 2019-06-06 MED ORDER — ALBUTEROL SULFATE HFA 108 (90 BASE) MCG/ACT IN AERS: 1.0000 | INHALATION_SPRAY | Freq: Four times a day (QID) | RESPIRATORY_TRACT | Status: DC | PRN

## 2019-06-06 MED ORDER — DEXAMETHASONE SODIUM PHOSPHATE 10 MG/ML IJ SOLN
10.0000 mg | Freq: Once | INTRAMUSCULAR | Status: AC
  Administered 2019-06-07: 10 mg via INTRAVENOUS
  Filled 2019-06-06: qty 1

## 2019-06-06 MED ORDER — BUPIVACAINE IN DEXTROSE 0.75-8.25 % IT SOLN
INTRATHECAL | Status: DC | PRN
  Administered 2019-06-06: 15 mg via INTRATHECAL

## 2019-06-06 MED ORDER — MENTHOL 3 MG MT LOZG: 1.0000 | LOZENGE | OROMUCOSAL | Status: DC | PRN

## 2019-06-06 MED ORDER — ALBUTEROL SULFATE (2.5 MG/3ML) 0.083% IN NEBU: 2.5000 mg | INHALATION_SOLUTION | Freq: Four times a day (QID) | RESPIRATORY_TRACT | Status: DC | PRN

## 2019-06-06 MED ORDER — PROPOFOL 500 MG/50ML IV EMUL
INTRAVENOUS | Status: DC | PRN
  Administered 2019-06-06: 75 ug/kg/min via INTRAVENOUS

## 2019-06-06 MED ORDER — DEXAMETHASONE SODIUM PHOSPHATE 10 MG/ML IJ SOLN
INTRAMUSCULAR | Status: DC | PRN
  Administered 2019-06-06: 10 mg via INTRAVENOUS

## 2019-06-06 MED ORDER — POLYETHYLENE GLYCOL 3350 17 G PO PACK
17.0000 g | PACK | Freq: Two times a day (BID) | ORAL | Status: DC
  Administered 2019-06-06: 17 g via ORAL
  Filled 2019-06-06 (×2): qty 1

## 2019-06-06 MED ORDER — SODIUM CHLORIDE 0.9 % IV SOLN
INTRAVENOUS | Status: DC
  Administered 2019-06-06: 13:00:00 via INTRAVENOUS

## 2019-06-06 MED ORDER — DEXAMETHASONE SODIUM PHOSPHATE 10 MG/ML IJ SOLN
INTRAMUSCULAR | Status: AC
  Filled 2019-06-06: qty 1

## 2019-06-06 MED ORDER — MIDAZOLAM HCL 5 MG/5ML IJ SOLN
INTRAMUSCULAR | Status: DC | PRN
  Administered 2019-06-06: 2 mg via INTRAVENOUS

## 2019-06-06 MED ORDER — FENTANYL CITRATE (PF) 100 MCG/2ML IJ SOLN
INTRAMUSCULAR | Status: AC
  Filled 2019-06-06: qty 2

## 2019-06-06 MED ORDER — PROMETHAZINE HCL 25 MG/ML IJ SOLN: 6.2500 mg | INTRAMUSCULAR | Status: DC | PRN

## 2019-06-06 MED ORDER — BUDESONIDE 3 MG PO CPEP
3.0000 mg | ORAL_CAPSULE | Freq: Every day | ORAL | Status: DC
  Administered 2019-06-06 – 2019-06-07 (×2): 3 mg via ORAL
  Filled 2019-06-06 (×2): qty 1

## 2019-06-06 MED ORDER — GABAPENTIN 300 MG PO CAPS
300.0000 mg | ORAL_CAPSULE | Freq: Every day | ORAL | Status: DC
  Administered 2019-06-06: 300 mg via ORAL
  Filled 2019-06-06: qty 1

## 2019-06-06 MED ORDER — BUPIVACAINE-EPINEPHRINE 0.5% -1:200000 IJ SOLN
INTRAMUSCULAR | Status: DC | PRN
  Administered 2019-06-06: 30 mL

## 2019-06-06 MED ORDER — LACTATED RINGERS IV SOLN
INTRAVENOUS | Status: DC
  Administered 2019-06-06 (×2): via INTRAVENOUS

## 2019-06-06 MED ORDER — FENTANYL CITRATE (PF) 100 MCG/2ML IJ SOLN: 25.0000 ug | INTRAMUSCULAR | Status: DC | PRN

## 2019-06-06 MED ORDER — MIDAZOLAM HCL 2 MG/2ML IJ SOLN: 1.0000 mg | INTRAMUSCULAR | Status: DC

## 2019-06-06 MED ORDER — SODIUM CHLORIDE (PF) 0.9 % IJ SOLN
INTRAMUSCULAR | Status: DC | PRN
  Administered 2019-06-06: 30 mL

## 2019-06-06 MED ORDER — KETOROLAC TROMETHAMINE 30 MG/ML IJ SOLN
INTRAMUSCULAR | Status: AC
  Filled 2019-06-06: qty 1

## 2019-06-06 MED ORDER — DIPHENHYDRAMINE HCL 25 MG PO CAPS
25.0000 mg | ORAL_CAPSULE | Freq: Four times a day (QID) | ORAL | Status: DC | PRN
  Administered 2019-06-06: 25 mg via ORAL
  Filled 2019-06-06: qty 1

## 2019-06-06 MED ORDER — BUPIVACAINE-EPINEPHRINE (PF) 0.5% -1:200000 IJ SOLN
INTRAMUSCULAR | Status: AC
  Filled 2019-06-06: qty 30

## 2019-06-06 MED ORDER — ALUM & MAG HYDROXIDE-SIMETH 200-200-20 MG/5ML PO SUSP: 15.0000 mL | ORAL | Status: DC | PRN

## 2019-06-06 MED ORDER — POVIDONE-IODINE 10 % EX SWAB
2.0000 "application " | Freq: Once | CUTANEOUS | Status: AC
  Administered 2019-06-06: 2 via TOPICAL

## 2019-06-06 MED ORDER — MECLIZINE HCL 25 MG PO TABS: 25.0000 mg | ORAL_TABLET | Freq: Three times a day (TID) | ORAL | Status: DC | PRN

## 2019-06-06 MED ORDER — BISACODYL 10 MG RE SUPP: 10.0000 mg | Freq: Every day | RECTAL | Status: DC | PRN

## 2019-06-06 MED ORDER — FENTANYL CITRATE (PF) 100 MCG/2ML IJ SOLN: 50.0000 ug | INTRAMUSCULAR | Status: DC

## 2019-06-06 MED ORDER — CEFAZOLIN SODIUM-DEXTROSE 2-4 GM/100ML-% IV SOLN
INTRAVENOUS | Status: AC
  Filled 2019-06-06: qty 100

## 2019-06-06 MED ORDER — TRANEXAMIC ACID-NACL 1000-0.7 MG/100ML-% IV SOLN
1000.0000 mg | Freq: Once | INTRAVENOUS | Status: AC
  Administered 2019-06-06: 1000 mg via INTRAVENOUS
  Filled 2019-06-06: qty 100

## 2019-06-06 MED ORDER — ONDANSETRON HCL 4 MG PO TABS: 4.0000 mg | ORAL_TABLET | Freq: Four times a day (QID) | ORAL | Status: DC | PRN

## 2019-06-06 MED ORDER — MIDAZOLAM HCL 2 MG/2ML IJ SOLN: 0.5000 mg | Freq: Once | INTRAMUSCULAR | Status: DC | PRN

## 2019-06-06 MED ORDER — METOCLOPRAMIDE HCL 5 MG PO TABS: 5.0000 mg | ORAL_TABLET | Freq: Three times a day (TID) | ORAL | Status: DC | PRN

## 2019-06-06 MED ORDER — DOCUSATE SODIUM 100 MG PO CAPS
100.0000 mg | ORAL_CAPSULE | Freq: Two times a day (BID) | ORAL | Status: DC
  Administered 2019-06-06 – 2019-06-07 (×2): 100 mg via ORAL
  Filled 2019-06-06 (×2): qty 1

## 2019-06-06 MED ORDER — DEXAMETHASONE SODIUM PHOSPHATE 10 MG/ML IJ SOLN: 10.0000 mg | Freq: Once | INTRAMUSCULAR | Status: DC

## 2019-06-06 MED ORDER — MONTELUKAST SODIUM 10 MG PO TABS
10.0000 mg | ORAL_TABLET | Freq: Every day | ORAL | Status: DC
  Administered 2019-06-06: 10 mg via ORAL
  Filled 2019-06-06: qty 1

## 2019-06-06 MED ORDER — CEFAZOLIN SODIUM-DEXTROSE 2-4 GM/100ML-% IV SOLN
2.0000 g | INTRAVENOUS | Status: AC
  Administered 2019-06-06: 2 g via INTRAVENOUS

## 2019-06-06 MED ORDER — CYCLOBENZAPRINE HCL 10 MG PO TABS
10.0000 mg | ORAL_TABLET | Freq: Three times a day (TID) | ORAL | Status: DC | PRN
  Administered 2019-06-06 – 2019-06-07 (×2): 10 mg via ORAL
  Filled 2019-06-06 (×2): qty 1

## 2019-06-06 MED ORDER — METOCLOPRAMIDE HCL 5 MG/ML IJ SOLN: 5.0000 mg | Freq: Three times a day (TID) | INTRAMUSCULAR | Status: DC | PRN

## 2019-06-06 MED ORDER — ROPIVACAINE HCL 7.5 MG/ML IJ SOLN
INTRAMUSCULAR | Status: DC | PRN
  Administered 2019-06-06: 20 mL via PERINEURAL

## 2019-06-06 MED ORDER — EPINEPHRINE 0.3 MG/0.3ML IJ SOAJ
0.3000 mg | Freq: Once | INTRAMUSCULAR | Status: DC | PRN
  Filled 2019-06-06: qty 0.6

## 2019-06-06 MED ORDER — SODIUM CHLORIDE (PF) 0.9 % IJ SOLN
INTRAMUSCULAR | Status: AC
  Filled 2019-06-06: qty 50

## 2019-06-06 MED ORDER — AMLODIPINE BESYLATE 5 MG PO TABS: 5.0000 mg | ORAL_TABLET | Freq: Every day | ORAL | Status: DC

## 2019-06-06 MED ORDER — LOSARTAN POTASSIUM 50 MG PO TABS: 100.0000 mg | ORAL_TABLET | Freq: Every day | ORAL | Status: DC

## 2019-06-06 MED ORDER — MEPERIDINE HCL 50 MG/ML IJ SOLN: 6.2500 mg | INTRAMUSCULAR | Status: DC | PRN

## 2019-06-06 MED ORDER — PROPOFOL 10 MG/ML IV BOLUS
INTRAVENOUS | Status: DC | PRN
  Administered 2019-06-06: 10 mg via INTRAVENOUS
  Administered 2019-06-06: 20 mg via INTRAVENOUS
  Administered 2019-06-06: 10 mg via INTRAVENOUS
  Administered 2019-06-06: 20 mg via INTRAVENOUS
  Administered 2019-06-06: 40 mg via INTRAVENOUS

## 2019-06-06 MED ORDER — MORPHINE SULFATE (PF) 4 MG/ML IV SOLN
0.5000 mg | INTRAVENOUS | Status: DC | PRN
  Administered 2019-06-07: 1 mg via INTRAVENOUS
  Filled 2019-06-06: qty 1

## 2019-06-06 MED ORDER — TRANEXAMIC ACID-NACL 1000-0.7 MG/100ML-% IV SOLN
1000.0000 mg | INTRAVENOUS | Status: AC
  Administered 2019-06-06: 1000 mg via INTRAVENOUS

## 2019-06-06 MED ORDER — HYDROCODONE-ACETAMINOPHEN 5-325 MG PO TABS: 1.0000 | ORAL_TABLET | ORAL | Status: DC | PRN

## 2019-06-06 MED ORDER — MIDAZOLAM HCL 2 MG/2ML IJ SOLN
INTRAMUSCULAR | Status: AC
  Filled 2019-06-06: qty 2

## 2019-06-06 MED ORDER — PHENOL 1.4 % MT LIQD: 1.0000 | OROMUCOSAL | Status: DC | PRN

## 2019-06-06 MED ORDER — SODIUM CHLORIDE 0.9 % IR SOLN
Status: DC | PRN
  Administered 2019-06-06: 1000 mL

## 2019-06-06 MED ORDER — CHLORHEXIDINE GLUCONATE 4 % EX LIQD: 60.0000 mL | Freq: Once | CUTANEOUS | Status: DC

## 2019-06-06 MED ORDER — ONDANSETRON HCL 4 MG/2ML IJ SOLN
INTRAMUSCULAR | Status: AC
  Filled 2019-06-06: qty 2

## 2019-06-06 MED ORDER — PANTOPRAZOLE SODIUM 40 MG PO TBEC
40.0000 mg | DELAYED_RELEASE_TABLET | Freq: Every day | ORAL | Status: DC | PRN
  Administered 2019-06-06: 40 mg via ORAL
  Filled 2019-06-06: qty 1

## 2019-06-06 MED ORDER — LIDOCAINE 2% (20 MG/ML) 5 ML SYRINGE
INTRAMUSCULAR | Status: AC
  Filled 2019-06-06: qty 5

## 2019-06-06 MED ORDER — CEFAZOLIN SODIUM-DEXTROSE 2-4 GM/100ML-% IV SOLN
2.0000 g | Freq: Four times a day (QID) | INTRAVENOUS | Status: AC
  Administered 2019-06-06 (×2): 2 g via INTRAVENOUS
  Filled 2019-06-06 (×2): qty 100

## 2019-06-06 MED ORDER — ONDANSETRON HCL 4 MG/2ML IJ SOLN
INTRAMUSCULAR | Status: DC | PRN
  Administered 2019-06-06: 4 mg via INTRAVENOUS

## 2019-06-06 MED ORDER — HYDROCODONE-ACETAMINOPHEN 7.5-325 MG PO TABS
1.0000 | ORAL_TABLET | ORAL | Status: DC | PRN
  Administered 2019-06-06: 2 via ORAL
  Administered 2019-06-06: 1 via ORAL
  Administered 2019-06-06 – 2019-06-07 (×3): 2 via ORAL
  Filled 2019-06-06: qty 2
  Filled 2019-06-06 (×3): qty 1
  Filled 2019-06-06 (×2): qty 2

## 2019-06-06 MED ORDER — FERROUS SULFATE 325 (65 FE) MG PO TABS
325.0000 mg | ORAL_TABLET | Freq: Two times a day (BID) | ORAL | Status: DC
  Administered 2019-06-07: 325 mg via ORAL
  Filled 2019-06-06: qty 1

## 2019-06-06 MED ORDER — ONDANSETRON HCL 4 MG/2ML IJ SOLN
4.0000 mg | Freq: Four times a day (QID) | INTRAMUSCULAR | Status: DC | PRN
  Administered 2019-06-06: 4 mg via INTRAVENOUS
  Filled 2019-06-06: qty 2

## 2019-06-06 MED ORDER — FAMOTIDINE 20 MG PO TABS
40.0000 mg | ORAL_TABLET | Freq: Every day | ORAL | Status: DC
  Administered 2019-06-06: 40 mg via ORAL
  Filled 2019-06-06 (×2): qty 2

## 2019-06-06 MED ORDER — ASPIRIN 81 MG PO CHEW
81.0000 mg | CHEWABLE_TABLET | Freq: Two times a day (BID) | ORAL | Status: DC
  Administered 2019-06-06 – 2019-06-07 (×2): 81 mg via ORAL
  Filled 2019-06-06 (×2): qty 1

## 2019-06-06 MED ORDER — PROPOFOL 10 MG/ML IV BOLUS
INTRAVENOUS | Status: AC
  Filled 2019-06-06: qty 60

## 2019-06-06 MED ORDER — ACETAMINOPHEN 325 MG PO TABS: 325.0000 mg | ORAL_TABLET | Freq: Four times a day (QID) | ORAL | Status: DC | PRN

## 2019-06-06 MED ORDER — KETOROLAC TROMETHAMINE 30 MG/ML IJ SOLN
INTRAMUSCULAR | Status: DC | PRN
  Administered 2019-06-06: 30 mg via INTRAVENOUS

## 2019-06-06 MED ORDER — TRANEXAMIC ACID-NACL 1000-0.7 MG/100ML-% IV SOLN
INTRAVENOUS | Status: AC
  Filled 2019-06-06: qty 100

## 2019-06-06 MED ORDER — MAGNESIUM CITRATE PO SOLN: 1.0000 | Freq: Once | ORAL | Status: DC | PRN

## 2019-06-06 SURGICAL SUPPLY — 64 items
ADH SKN CLS APL DERMABOND .7 (GAUZE/BANDAGES/DRESSINGS) ×1
ATTUNE MED ANAT PAT 38 KNEE (Knees) ×1 IMPLANT
ATTUNE PS FEM LT SZ 5 CEM KNEE (Femur) ×1 IMPLANT
ATTUNE PSRP INSR SZ5 6 KNEE (Insert) ×1 IMPLANT
BAG SPEC THK2 15X12 ZIP CLS (MISCELLANEOUS)
BAG ZIPLOCK 12X15 (MISCELLANEOUS) IMPLANT
BASE TIBIAL ROT PLAT SZ 5 KNEE (Knees) IMPLANT
BLADE SAW SGTL 11.0X1.19X90.0M (BLADE) IMPLANT
BLADE SAW SGTL 13.0X1.19X90.0M (BLADE) ×2 IMPLANT
BLADE SURG SZ10 CARB STEEL (BLADE) ×4 IMPLANT
BNDG ELASTIC 6X5.8 VLCR STR LF (GAUZE/BANDAGES/DRESSINGS) ×2 IMPLANT
BOWL SMART MIX CTS (DISPOSABLE) ×2 IMPLANT
BSPLAT TIB 5 CMNT ROT PLAT STR (Knees) ×1 IMPLANT
CEMENT HV SMART SET (Cement) ×2 IMPLANT
COVER SURGICAL LIGHT HANDLE (MISCELLANEOUS) ×2 IMPLANT
COVER WAND RF STERILE (DRAPES) IMPLANT
CUFF TOURN SGL QUICK 34 (TOURNIQUET CUFF) ×2
CUFF TRNQT CYL 34X4.125X (TOURNIQUET CUFF) ×1 IMPLANT
DECANTER SPIKE VIAL GLASS SM (MISCELLANEOUS) ×4 IMPLANT
DERMABOND ADVANCED (GAUZE/BANDAGES/DRESSINGS) ×1
DERMABOND ADVANCED .7 DNX12 (GAUZE/BANDAGES/DRESSINGS) ×1 IMPLANT
DRAPE SHEET LG 3/4 BI-LAMINATE (DRAPES) ×1 IMPLANT
DRAPE U-SHAPE 47X51 STRL (DRAPES) ×2 IMPLANT
DRESSING AQUACEL AG SP 3.5X10 (GAUZE/BANDAGES/DRESSINGS) ×1 IMPLANT
DRSG AQUACEL AG SP 3.5X10 (GAUZE/BANDAGES/DRESSINGS) ×2
DURAPREP 26ML APPLICATOR (WOUND CARE) ×4 IMPLANT
ELECT REM PT RETURN 15FT ADLT (MISCELLANEOUS) ×2 IMPLANT
GLOVE BIO SURGEON STRL SZ 6 (GLOVE) ×2 IMPLANT
GLOVE BIOGEL PI IND STRL 6.5 (GLOVE) ×1 IMPLANT
GLOVE BIOGEL PI IND STRL 7.5 (GLOVE) ×1 IMPLANT
GLOVE BIOGEL PI IND STRL 8.5 (GLOVE) ×1 IMPLANT
GLOVE BIOGEL PI INDICATOR 6.5 (GLOVE) ×1
GLOVE BIOGEL PI INDICATOR 7.5 (GLOVE) ×1
GLOVE BIOGEL PI INDICATOR 8.5 (GLOVE) ×1
GLOVE ECLIPSE 8.0 STRL XLNG CF (GLOVE) ×2 IMPLANT
GLOVE ORTHO TXT STRL SZ7.5 (GLOVE) ×2 IMPLANT
GOWN STRL REUS W/ TWL LRG LVL3 (GOWN DISPOSABLE) ×1 IMPLANT
GOWN STRL REUS W/TWL 2XL LVL3 (GOWN DISPOSABLE) ×2 IMPLANT
GOWN STRL REUS W/TWL LRG LVL3 (GOWN DISPOSABLE) ×4 IMPLANT
HANDPIECE INTERPULSE COAX TIP (DISPOSABLE) ×2
HOLDER FOLEY CATH W/STRAP (MISCELLANEOUS) ×1 IMPLANT
KIT TURNOVER KIT A (KITS) IMPLANT
MANIFOLD NEPTUNE II (INSTRUMENTS) ×2 IMPLANT
NDL SAFETY ECLIPSE 18X1.5 (NEEDLE) IMPLANT
NEEDLE HYPO 18GX1.5 SHARP (NEEDLE) ×2
NS IRRIG 1000ML POUR BTL (IV SOLUTION) ×2 IMPLANT
PACK TOTAL KNEE CUSTOM (KITS) ×2 IMPLANT
PIN DRILL FIX HALF THREAD (BIT) ×1 IMPLANT
PIN FIX SIGMA LCS THRD HI (PIN) ×1 IMPLANT
PROTECTOR NERVE ULNAR (MISCELLANEOUS) ×2 IMPLANT
SET HNDPC FAN SPRY TIP SCT (DISPOSABLE) ×1 IMPLANT
SET PAD KNEE POSITIONER (MISCELLANEOUS) ×2 IMPLANT
SUT MNCRL AB 4-0 PS2 18 (SUTURE) ×2 IMPLANT
SUT STRATAFIX PDS+ 0 24IN (SUTURE) ×2 IMPLANT
SUT VIC AB 1 CT1 36 (SUTURE) ×2 IMPLANT
SUT VIC AB 2-0 CT1 27 (SUTURE) ×6
SUT VIC AB 2-0 CT1 TAPERPNT 27 (SUTURE) ×3 IMPLANT
SYR 3ML LL SCALE MARK (SYRINGE) ×2 IMPLANT
TIBIAL BASE ROT PLAT SZ 5 KNEE (Knees) ×2 IMPLANT
TRAY FOLEY MTR SLVR 14FR STAT (SET/KITS/TRAYS/PACK) ×1 IMPLANT
TRAY FOLEY MTR SLVR 16FR STAT (SET/KITS/TRAYS/PACK) ×1 IMPLANT
WATER STERILE IRR 1000ML POUR (IV SOLUTION) ×4 IMPLANT
WRAP KNEE MAXI GEL POST OP (GAUZE/BANDAGES/DRESSINGS) ×2 IMPLANT
YANKAUER SUCT BULB TIP 10FT TU (MISCELLANEOUS) ×2 IMPLANT

## 2019-06-06 NOTE — Anesthesia Postprocedure Evaluation (Signed)
Anesthesia Post Note  Patient: [name]  Procedure(s) Performed: TOTAL KNEE ARTHROPLASTY (Left )     Patient location during evaluation: PACU Anesthesia Type: Spinal and Regional Level of consciousness: awake and alert, oriented and patient cooperative Pain management: pain level controlled Vital Signs Assessment: post-procedure vital signs reviewed and stable Respiratory status: spontaneous breathing, nonlabored ventilation, respiratory function stable and patient connected to face mask oxygen Cardiovascular status: blood pressure returned to baseline and stable Postop Assessment: no apparent nausea or vomiting, spinal receding and patient able to bend at knees Anesthetic complications: no    Last Vitals:  Vitals:   06/06/19 1215 06/06/19 1232  BP: (!) 136/97 135/87  Pulse: 73 77  Resp: 18 15  Temp: (!) 36.4 C (!) 36.4 C  SpO2: 97% 100%    Last Pain:  Vitals:   06/06/19 1232  TempSrc: Oral  PainSc:                  Dwight Adamczak,E. Lewin Pellow

## 2019-06-06 NOTE — Transfer of Care (Signed)
Immediate Anesthesia Transfer of Care Note  Patient: [name]  Procedure(s) Performed: TOTAL KNEE ARTHROPLASTY (Left )  Patient Location: PACU  Anesthesia Type:MAC and Spinal  Level of Consciousness: awake, alert , oriented and patient cooperative  Airway & Oxygen Therapy: Patient Spontanous Breathing and Patient connected to face mask oxygen  Post-op Assessment: Report given to RN and Post -op Vital signs reviewed and stable  Post vital signs: Reviewed and stable  Last Vitals:  Vitals Value Taken Time  BP 128/85 06/06/19 1109  Temp    Pulse 81 06/06/19 1110  Resp 13 06/06/19 1110  SpO2 92 % 06/06/19 1110  Vitals shown include unvalidated device data.  Last Pain:  Vitals:   06/06/19 0756  TempSrc:   PainSc: 3       Patients Stated Pain Goal: 4 (20/35/59 7416)  Complications: No apparent anesthesia complications

## 2019-06-06 NOTE — Evaluation (Signed)
Physical Therapy Evaluation Patient Details Name: [name] MRN: [id number] DOB: 02-07-1966 Today's Date: 06/06/2019   History of Present Illness  53 yo female s/p L TKR on 06/06/19. PMH includes crohn's disease, diverticulitis, HTN, OA, asthma, obesity.  Clinical Impression  Pt presents with L knee pain, decreased L knee ROM, post-operative LLE weakness, increased time and effort to perform mobility tasks, and decreased activity tolerance. Pt to benefit from acute PT to address deficits. Pt ambulated 100 ft with RW with min guard assist, verbal cuing for sequencing and form provided throughout. Pt educated on ankle pumps (20/hour) to perform this afternoon/evening to increase circulation, to pt's tolerance and limited by pain. PT to progress mobility as tolerated, and will continue to follow acutely.        Follow Up Recommendations Follow surgeon's recommendation for DC plan and follow-up therapies;Supervision for mobility/OOB(OPPT scheduled 9/3)    Equipment Recommendations  None recommended by PT    Recommendations for Other Services       Precautions / Restrictions Precautions Precautions: Fall Restrictions Weight Bearing Restrictions: No Other Position/Activity Restrictions: WBAT      Mobility  Bed Mobility Overal bed mobility: Needs Assistance Bed Mobility: Supine to Sit     Supine to sit: Min assist;HOB elevated     General bed mobility comments: Min assist for LLE lift assist, verbal cuing for sequencing to EOB.  Transfers Overall transfer level: Needs assistance Equipment used: Rolling walker (2 wheeled) Transfers: Sit to/from Stand Sit to Stand: Min guard;From elevated surface         General transfer comment: Min guard for safety, verbal cuing for hand placement when rising.  Ambulation/Gait Ambulation/Gait assistance: Min guard Gait Distance (Feet): 100 Feet Assistive device: Rolling walker (2 wheeled) Gait Pattern/deviations: Step-to  pattern;Step-through pattern;Decreased stride length;Trunk flexed;Antalgic;Decreased weight shift to left;Decreased stance time - left Gait velocity: decr   General Gait Details: Min guard for safety, verbal cuing for sequencing although pt progressing to step-through gait, placement in RW, upright posture.  Stairs            Wheelchair Mobility    Modified Rankin (Stroke Patients Only)       Balance Overall balance assessment: Mild deficits observed, not formally tested                                           Pertinent Vitals/Pain Pain Assessment: 0-10 Pain Score: 5  Pain Location: L knee Pain Descriptors / Indicators: Sore Pain Intervention(s): Limited activity within patient's tolerance;Monitored during session;Premedicated before session;Repositioned;Ice applied    Home Living Family/patient expects to be discharged to:: Private residence Living Arrangements: Spouse/significant other Available Help at Discharge: Family Type of Home: House Home Access: Level entry     Home Layout: One [place]: Environmental consultant - 2 wheels;Cane - single point;Bedside commode      Prior Function Level of Independence: Independent with assistive device(s)         Comments: pt reports using cane for ambulation PTA. 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     Hand Dominance   Dominant Hand: Right    Extremity/Trunk Assessment   Upper Extremity Assessment Upper Extremity Assessment: Overall WFL for tasks assessed    Lower Extremity Assessment Lower Extremity Assessment: Overall WFL for tasks assessed;LLE deficits/detail LLE Deficits /  Details: suspected post-surgical weakness; able to perform ankle pumps, quad set, heel slide to 80*, SLR without lift assist or quad lag LLE Sensation: WNL    Cervical / Trunk Assessment Cervical / Trunk Assessment: Normal  Communication    Communication: No difficulties  Cognition Arousal/Alertness: Awake/alert Behavior During Therapy: WFL for tasks assessed/performed Overall Cognitive Status: Within Functional Limits for tasks assessed                                        General Comments      Exercises     Assessment/Plan    PT Assessment Patient needs continued PT services  PT Problem List Decreased strength;Decreased mobility;Decreased range of motion;Decreased activity tolerance;Decreased balance;Decreased knowledge of use of DME;Pain       PT Treatment Interventions DME instruction;Therapeutic activities;Gait training;Therapeutic exercise;Patient/family education;Balance training;Functional mobility training    PT Goals (Current goals can be found in the Care Plan section)  Acute Rehab PT Goals Patient Stated Goal: get back to walking for exercise PT Goal Formulation: With patient Time For Goal Achievement: 06/13/19 Potential to Achieve Goals: Good    Frequency 7X/week   Barriers to discharge        Co-evaluation               AM-PAC PT "6 Clicks" Mobility  Outcome Measure Help needed turning from your back to your side while in a flat bed without using bedrails?: A Little Help needed moving from lying on your back to sitting on the side of a flat bed without using bedrails?: A Little Help needed moving to and from a bed to a chair (including a wheelchair)?: A Little Help needed standing up from a chair using your arms (e.g., wheelchair or bedside chair)?: A Little Help needed to walk in hospital room?: A Little Help needed climbing 3-5 steps with a railing? : A Little 6 Click Score: 18    End of Session Equipment Utilized During Treatment: Gait belt Activity Tolerance: Patient tolerated treatment well Patient left: in chair;with call bell/phone within reach;with SCD's reapplied(chair alarm set up not in room; pt agrees to press call button and wait for assist before  mobilizing) Nurse Communication: Mobility status PT Visit Diagnosis: Other abnormalities of gait and mobility (R26.89);Difficulty in walking, not elsewhere classified (R26.2)    Time: 0141-0301 PT Time Calculation (min) (ACUTE ONLY): 43 min   Charges:   PT Evaluation $PT Eval Low Complexity: 1 Low PT Treatments $Gait Training: 8-22 mins       Julien Girt, PT Acute Rehabilitation Services Pager [phone]  Office [phone]  Paris Chirib[place] D Katori Wirsing 06/06/2019, 5:01 PM

## 2019-06-06 NOTE — Anesthesia Procedure Notes (Signed)
Spinal  Patient location during procedure: OR End time: 06/06/2019 9:26 AM Staffing Anesthesiologist: Annye Asa, MD Performed: anesthesiologist  Preanesthetic Checklist Completed: patient identified, site marked, surgical consent, pre-op evaluation, timeout performed, IV checked, risks and benefits discussed and monitors and equipment checked Spinal Block Patient position: sitting Prep: site prepped and draped and DuraPrep Patient monitoring: blood pressure, continuous pulse ox, cardiac monitor and heart rate Approach: midline Location: L3-4 Injection technique: single-shot Needle Needle type: Pencan  Needle gauge: 24 G Needle length: 9 cm Additional Notes Pt identified in Operating room.  Monitors applied. Working IV access confirmed. Sterile prep, drape lumbar spine.  1% lido local L 3,4. CRNA unsuccessful, so repeat local L3,4 and  #24ga Pencan into clear CSF L 3,4.  41m 0.75% Bupivacaine with dextrose injected with asp CSF beginning and end of injection.  Patient asymptomatic, VSS, no heme aspirated, tolerated well.  CJenita Seashore MD

## 2019-06-06 NOTE — Op Note (Signed)
NAME:  [place] RECORD NO.:  [id number]                             FACILITY:  [facility]      PHYSICIAN:  Pietro Cassis. Alvan Dame, M.D.  DATE OF BIRTH:  12-16-1965      DATE OF PROCEDURE:  06/06/2019                                     OPERATIVE REPORT         PREOPERATIVE DIAGNOSIS:  Left knee osteoarthritis.      POSTOPERATIVE DIAGNOSIS:  Left knee osteoarthritis.      FINDINGS:  The patient was noted to have complete loss of cartilage and   bone-on-bone arthritis with associated osteophytes in the medial and patellofemoral compartments of   the knee with a significant synovitis and associated effusion.  The patient had failed months of conservative treatment including medications, injection therapy, activity modification.     PROCEDURE:  Left total knee replacement.      COMPONENTS USED:  DePuy Attune rotating platform posterior stabilized knee   system, a size 5 femur, 5 tibia, size 6 mm PS AOX insert, and 38 anatomic patellar   button.      SURGEON:  Pietro Cassis. Alvan Dame, M.D.      ASSISTANT:  Griffith Citron, PA-C.      ANESTHESIA:  Regional and Spinal.      SPECIMENS:  None.      COMPLICATION:  None.      DRAINS:  None.  EBL: <100cc      TOURNIQUET TIME:   Total Tourniquet Time Documented: Thigh (Left) - 28 minutes Total: Thigh (Left) - 28 minutes  .      The patient was stable to the recovery room.      INDICATION FOR PROCEDURE:  [name] is a 53 y.o. female patient of   mine.  The patient had been seen, evaluated, and treated for months conservatively in the   office with medication, activity modification, and injections.  The patient had   radiographic changes of bone-on-bone arthritis with endplate sclerosis and osteophytes noted.  Based on the radiographic changes and failed conservative measures, the patient   decided to proceed with definitive treatment, total knee replacement.  Risks of infection, DVT, component failure, need  for revision surgery, neurovascular injury were reviewed in the office setting.  The postop course was reviewed stressing the efforts to maximize post-operative satisfaction and function.  Consent was obtained for benefit of pain   relief.      PROCEDURE IN DETAIL:  The patient was brought to the operative theater.   Once adequate anesthesia, preoperative antibiotics, 2 gm of Ancef,1 gm of Tranexamic Acid, and 10 mg of Decadron administered, the patient was positioned supine with a left thigh tourniquet placed.  The  left lower extremity was prepped and draped in sterile fashion.  A time-   out was performed identifying the patient, planned procedure, and the appropriate extremity.      The left lower extremity was placed in the [facility] leg holder.  The leg was   exsanguinated, tourniquet elevated to 250 mmHg.  A midline incision was   made  followed by median parapatellar arthrotomy.  Following initial   exposure, attention was first directed to the patella.  Precut   measurement was noted to be 24 mm.  I resected down to 14 mm and used a   38 anatomic patellar button to restore patellar height as well as cover the cut surface.      The lug holes were drilled and a metal shim was placed to protect the   patella from retractors and saw blade during the procedure.      At this point, attention was now directed to the femur.  The femoral   canal was opened with a drill, irrigated to try to prevent fat emboli.  An   intramedullary rod was passed at 5 degrees valgus, 9 mm of bone was   resected off the distal femur.  Following this resection, the tibia was   subluxated anteriorly.  Using the extramedullary guide, 2 mm of bone was resected off   the proximal medial tibia.  We confirmed the gap would be   stable medially and laterally with a size 5 spacer block as well as confirmed that the tibial cut was perpendicular in the coronal plane, checking with an alignment rod.      Once this was done, I  sized the femur to be a size 5 in the anterior-   posterior dimension, chose a standard component based on medial and   lateral dimension.  The size 5 rotation block was then pinned in   position anterior referenced using the C-clamp to set rotation.  The   anterior, posterior, and  chamfer cuts were made without difficulty nor   notching making certain that I was along the anterior cortex to help   with flexion gap stability.      The final box cut was made off the lateral aspect of distal femur.      At this point, the tibia was sized to be a size 5.  The size 5 tray was   then pinned in position through the medial third of the tubercle,   drilled, and keel punched.  Trial reduction was now carried with a 5 femur,  5 tibia, a size 6 mm PS insert, and the 38 anatomic patella botton.  The knee was brought to full extension with good flexion stability with the patella   tracking through the trochlea without application of pressure.  Given   all these findings the trial components removed.  Final components were   opened and cement was mixed.  The knee was irrigated with normal saline solution and pulse lavage.  The synovial lining was   then injected with 30 cc of 0.25% Marcaine with epinephrine, 1 cc of Toradol and 30 cc of NS for a total of 61 cc.     Final implants were then cemented onto cleaned and dried cut surfaces of bone with the knee brought to extension with a size 6 mm PS trial insert.      Once the cement had fully cured, excess cement was removed   throughout the knee.  I confirmed that I was satisfied with the range of   motion and stability, and the final size 6 mm PS AOX insert was chosen.  It was   placed into the knee.      The tourniquet had been let down at 28 minutes.  No significant   hemostasis was required.  The extensor mechanism was then reapproximated using #1 Vicryl and #  1 Stratafix sutures with the knee   in flexion.  The   remaining wound was closed with 2-0  Vicryl and running 4-0 Monocryl.   The knee was cleaned, dried, dressed sterilely using Dermabond and   Aquacel dressing.  The patient was then   brought to recovery room in stable condition, tolerating the procedure   well.   Please note that Physician Assistant, Griffith Citron, PA-C was present for the entirety of the case, and was utilized for pre-operative positioning, peri-operative retractor management, general facilitation of the procedure and for primary wound closure at the end of the case.              Pietro Cassis Alvan Dame, M.D.    06/06/2019 10:46 AM

## 2019-06-06 NOTE — Discharge Instructions (Signed)

## 2019-06-06 NOTE — Anesthesia Preprocedure Evaluation (Addendum)
Anesthesia Evaluation  Patient identified by MRN, date of birth, ID band Patient awake    Reviewed: Allergy & Precautions, NPO status , Patient's Chart, lab work & pertinent test results  History of Anesthesia Complications (+) PONV  Airway Mallampati: II  TM Distance: >3 FB Neck ROM: Full    Dental  (+) Poor Dentition, Chipped, Missing, Dental Advisory Given   Pulmonary sleep apnea and Continuous Positive Airway Pressure Ventilation , COPD,  COPD inhaler,  06/02/2019 SARS coronavirus NEG   breath sounds clear to auscultation       Cardiovascular hypertension, Pt. on medications (-) angina Rhythm:Regular Rate:Normal  '17 ECHO: EF 55-60%, valves oK   Neuro/Psych  Headaches, Anxiety    GI/Hepatic Neg liver ROS, GERD  Medicated and Controlled,  Endo/Other  Morbid obesity  Renal/GU negative Renal ROS     Musculoskeletal  (+) Arthritis ,   Abdominal (+) + obese,   Peds  Hematology negative hematology ROS (+)   Anesthesia Other Findings   Reproductive/Obstetrics S/p Hysterectomy                            Anesthesia Physical Anesthesia Plan  ASA: III  Anesthesia Plan: Spinal   Post-op Pain Management:  Regional for Post-op pain   Induction:   PONV Risk Score and Plan: 3 and Ondansetron, Dexamethasone and Treatment may vary due to age or medical condition  Airway Management Planned: Natural Airway and Simple Face Mask  Additional Equipment:   Intra-op Plan:   Post-operative Plan:   Informed Consent: I have reviewed the patients History and Physical, chart, labs and discussed the procedure including the risks, benefits and alternatives for the proposed anesthesia with the patient or authorized representative who has indicated his/her understanding and acceptance.     Dental advisory given  Plan Discussed with: CRNA and Surgeon  Anesthesia Plan Comments: (Plan routine monitors,  SAB with adductor canal block for post op analgesia)       Anesthesia Quick Evaluation

## 2019-06-06 NOTE — Interval H&P Note (Signed)
History and Physical Interval Note:  06/06/2019 7:44 AM  [name]  has presented today for surgery, with the diagnosis of Left knee osteoarthritis.  The various methods of treatment have been discussed with the patient and family. After consideration of risks, benefits and other options for treatment, the patient has consented to  Procedure(s) with comments: TOTAL KNEE ARTHROPLASTY (Left) - 70 mins as a surgical intervention.  The patient's history has been reviewed, patient examined, no change in status, stable for surgery.  I have reviewed the patient's chart and labs.  Questions were answered to the patient's satisfaction.     [name]

## 2019-06-06 NOTE — Anesthesia Procedure Notes (Signed)
Anesthesia Regional Block: Adductor canal block   Pre-Anesthetic Checklist: ,, timeout performed, Correct Patient, Correct Site, Correct Laterality, Correct Procedure, Correct Position, site marked, Risks and benefits discussed,  Surgical consent,  Pre-op evaluation,  At surgeon's request and post-op pain management  Laterality: Left and Lower  Prep: chloraprep       Needles:  Injection technique: Single-shot  Needle Type: Echogenic Needle     Needle Length: 9cm  Needle Gauge: 21     Additional Needles:   Procedures:,,,, ultrasound used (permanent image in chart),,,,   Nerve Stimulator or Paresthesia:  Response: patella twitch, 0.45 mA,   Additional Responses:   Narrative:  Start time: 06/06/2019 8:13 AM End time: 06/06/2019 8:19 AM Injection made incrementally with aspirations every 5 mL.  Performed by: Personally  Anesthesiologist: Annye Asa, MD  Additional Notes: Pt identified in Holding room.  Monitors applied. Working IV access confirmed. Sterile prep, drape L thigh.  #21ga ECHOgenic needle into adductor canal with US guidance.  20cc 0.75% Ropivacaine   injected incrementally after negative test dose.  Patient asymptomatic, VSS, no heme aspirated, tolerated well.   Jenita Seashore, MD

## 2019-06-06 NOTE — Anesthesia Procedure Notes (Signed)
Date/Time: 06/06/2019 9:14 AM Performed by: Sharlette Dense, CRNA Oxygen Delivery Method: Simple face mask

## 2019-06-07 ENCOUNTER — Encounter (HOSPITAL_COMMUNITY): Payer: Self-pay | Admitting: Orthopedic Surgery

## 2019-06-07 DIAGNOSIS — M1712 Unilateral primary osteoarthritis, left knee: Secondary | ICD-10-CM | POA: Diagnosis not present

## 2019-06-07 LAB — CBC
HCT: 34.3 % — ABNORMAL LOW (ref 36.0–46.0)
Hemoglobin: 10.6 g/dL — ABNORMAL LOW (ref 12.0–15.0)
MCH: 28.3 pg (ref 26.0–34.0)
MCHC: 30.9 g/dL (ref 30.0–36.0)
MCV: 91.7 fL (ref 80.0–100.0)
Platelets: 291 10*3/uL (ref 150–400)
RBC: 3.74 MIL/uL — ABNORMAL LOW (ref 3.87–5.11)
RDW: 14.6 % (ref 11.5–15.5)
WBC: 7.9 10*3/uL (ref 4.0–10.5)
nRBC: 0 % (ref 0.0–0.2)

## 2019-06-07 LAB — BASIC METABOLIC PANEL
Anion gap: 9 (ref 5–15)
BUN: 11 mg/dL (ref 6–20)
CO2: 25 mmol/L (ref 22–32)
Calcium: 8.8 mg/dL — ABNORMAL LOW (ref 8.9–10.3)
Chloride: 105 mmol/L (ref 98–111)
Creatinine, Ser: 0.75 mg/dL (ref 0.44–1.00)
GFR calc Af Amer: >60 mL/min (ref >60)
GFR calc non Af Amer: >60 mL/min (ref >60)
Glucose, Bld: 112 mg/dL — ABNORMAL HIGH (ref 70–99)
Potassium: 4.2 mmol/L (ref 3.5–5.1)
Sodium: 139 mmol/L (ref 135–145)

## 2019-06-07 MED ORDER — OXYCODONE HCL 5 MG PO TABS
10.0000 mg | ORAL_TABLET | ORAL | Status: DC | PRN
  Administered 2019-06-07: 10 mg via ORAL
  Filled 2019-06-07: qty 2

## 2019-06-07 MED ORDER — OXYCODONE HCL 5 MG PO TABS: 5.0000 mg | ORAL_TABLET | ORAL | Status: DC | PRN

## 2019-06-07 MED ORDER — OXYCODONE HCL 5 MG PO TABS: 5.0000 mg | ORAL_TABLET | ORAL | 0 refills | Status: DC | PRN

## 2019-06-07 MED ORDER — POLYETHYLENE GLYCOL 3350 17 G PO PACK: 17.0000 g | PACK | Freq: Two times a day (BID) | ORAL | 0 refills | Status: DC

## 2019-06-07 MED ORDER — ACETAMINOPHEN 500 MG PO TABS: 1000.0000 mg | ORAL_TABLET | Freq: Three times a day (TID) | ORAL | Status: DC

## 2019-06-07 MED ORDER — FERROUS SULFATE 325 (65 FE) MG PO TABS: 325.0000 mg | ORAL_TABLET | Freq: Three times a day (TID) | ORAL | 0 refills | Status: DC

## 2019-06-07 MED ORDER — ASPIRIN 81 MG PO CHEW: 81.0000 mg | CHEWABLE_TABLET | Freq: Two times a day (BID) | ORAL | 0 refills | Status: AC

## 2019-06-07 MED ORDER — DOCUSATE SODIUM 100 MG PO CAPS: 100.0000 mg | ORAL_CAPSULE | Freq: Two times a day (BID) | ORAL | 0 refills | Status: DC

## 2019-06-07 MED ORDER — ACETAMINOPHEN 500 MG PO TABS: 1000.0000 mg | ORAL_TABLET | Freq: Three times a day (TID) | ORAL | 0 refills | Status: DC

## 2019-06-07 MED ORDER — CELECOXIB 200 MG PO CAPS: 200.0000 mg | ORAL_CAPSULE | Freq: Every day | ORAL | 0 refills | Status: DC

## 2019-06-07 MED ORDER — CYCLOBENZAPRINE HCL 10 MG PO TABS: 10.0000 mg | ORAL_TABLET | Freq: Three times a day (TID) | ORAL | 0 refills | Status: DC | PRN

## 2019-06-07 MED FILL — oxyCODONE HCL 5 MG TABS: 5 | 5 days supply | Qty: 60 | Fill #0

## 2019-06-07 MED FILL — CELECOXIB 200 MG CAP: 200 | 30 days supply | Qty: 30 | Fill #0

## 2019-06-07 MED FILL — ASPIRIN LOW DOSE 81 MG CHEW: 81 | 30 days supply | Qty: 60 | Fill #0

## 2019-06-07 MED FILL — CYCLOBENZAPRINE HCL 10 MG T: 10 | 13 days supply | Qty: 40 | Fill #0

## 2019-06-07 NOTE — Progress Notes (Signed)
Physical Therapy Treatment Patient Details Name: MIA MILAN MRN: [id number] DOB: 07-19-1966 Today's Date: 06/07/2019    History of Present Illness 53 yo female s/p L TKR on 06/06/19. PMH includes crohn's disease, diverticulitis, HTN, OA, asthma, obesity.    PT Comments    Continued gait training this session. All education completed. Okay to d/c from PT standpoint.    Follow Up Recommendations  Follow surgeon's recommendation for DC plan and follow-up therapies     Equipment Recommendations  None recommended by PT    Recommendations for Other Services       Precautions / Restrictions Precautions Precautions: Fall Restrictions Weight Bearing Restrictions: No LLE Weight Bearing: Weight bearing as tolerated    Mobility  Bed Mobility               General bed mobility comments: oob in recliner  Transfers Overall transfer level: Needs assistance Equipment used: Rolling walker (2 wheeled) Transfers: Sit to/from Stand Sit to Stand: Supervision         General transfer comment: for safety  Ambulation/Gait Ambulation/Gait assistance: Min guard Gait Distance (Feet): 140 Feet Assistive device: Rolling walker (2 wheeled) Gait Pattern/deviations: Step-to pattern;Step-through pattern;Decreased stride length     General Gait Details: for safety. slow gait speed. steady with RW.   Stairs             Wheelchair Mobility    Modified Rankin (Stroke Patients Only)       Balance Overall balance assessment: Mild deficits observed, not formally tested                                          Cognition Arousal/Alertness: Awake/alert Behavior During Therapy: WFL for tasks assessed/performed Overall Cognitive Status: Within Functional Limits for tasks assessed                                        Exercises Total Joint Exercises Ankle Circles/Pumps: AROM;Both;10 reps;Seated Quad Sets: AROM;10 reps;Seated Hip  ABduction/ADduction: AROM;Left;10 reps;Seated Straight Leg Raises: AROM;Left;10 reps;Seated Knee Flexion: AROM;Left;10 reps;Seated Goniometric ROM: ~10-70 degrees    General Comments        Pertinent Vitals/Pain Pain Assessment: 0-10 Pain Score: 6  Pain Location: L knee Pain Descriptors / Indicators: Sore;Aching Pain Intervention(s): Monitored during session    Home Living                      Prior Function            PT Goals (current goals can now be found in the care plan section) Progress towards PT goals: Progressing toward goals    Frequency    7X/week      PT Plan Current plan remains appropriate    Co-evaluation              AM-PAC PT "6 Clicks" Mobility   Outcome Measure  Help needed turning from your back to your side while in a flat bed without using bedrails?: A Little Help needed moving from lying on your back to sitting on the side of a flat bed without using bedrails?: A Little Help needed moving to and from a bed to a chair (including a wheelchair)?: A Little Help needed standing up from a chair using your arms (e.g., wheelchair  or bedside chair)?: A Little Help needed to walk in hospital room?: A Little Help needed climbing 3-5 steps with a railing? : A Little 6 Click Score: 18    End of Session Equipment Utilized During Treatment: Gait belt Activity Tolerance: Patient tolerated treatment well Patient left: in chair;with call bell/phone within reach   PT Visit Diagnosis: Other abnormalities of gait and mobility (R26.89)     Time: 4039-7953 PT Time Calculation (min) (ACUTE ONLY): 11 min  Charges:  $Gait Training: 8-22 mins $Therapeutic Exercise: 8-22 mins             Weston Anna, PT Acute Rehabilitation Services Pager: [phone] Office: [phone]

## 2019-06-07 NOTE — Progress Notes (Signed)
     Subjective: 1 Day Post-Op Procedure(s) (LRB): TOTAL KNEE ARTHROPLASTY (Left)   Patient reports pain as moderate, pain not fully controlled.  We will change the patient's from hydrocodone to oxycodone, to see if this helps control her pain better.  No events throughout the night.  Patient feels that she is already doing quite well and looking forward to continued improvement.  Dr. Alvan Dame did discuss the procedure, findings and expectations moving forward.  Patient is ready be discharged home, if she does well with therapy.   Patient's anticipated LOS is less than 2 midnights, meeting these requirements: - Younger than 70 - Lives within 1 hour of care - Has a competent adult at home to recover with post-op recover - NO history of  - Chronic pain requiring opiods  - Diabetes  - Coronary Artery Disease  - Heart failure  - Heart attack  - Stroke  - DVT/VTE  - Cardiac arrhythmia  - Respiratory Failure/COPD  - Renal failure  - Anemia  - Advanced Liver disease       Objective:   VITALS:   Vitals:   06/07/19 0123 06/07/19 0519  BP: 127/82 (!) 141/99  Pulse: 74 78  Resp: 15 18  Temp: 97.9 F (36.6 C) 97.8 F (36.6 C)  SpO2: 99% 100%    Dorsiflexion/Plantar flexion intact Incision: dressing C/D/I No cellulitis present Compartment soft  LABS Recent Labs    06/07/19 0535  HGB 10.6*  HCT 34.3*  WBC 7.9  PLT 291    Recent Labs    06/07/19 0535  NA 139  K 4.2  BUN 11  CREATININE 0.75  GLUCOSE 112*     Assessment/Plan: 1 Day Post-Op Procedure(s) (LRB): TOTAL KNEE ARTHROPLASTY (Left) Changed analgesic medication to Oxycodone Foley cath d/c'ed Advance diet Up with therapy D/C IV fluids Discharge home Follow up in 2 weeks at [facility] ([place]). Follow up with OLIN,Cigi Bega D in 2 weeks.  Contact information:  [facility]) [address] [place] [phone]     Obese (BMI 30-39.9) Estimated body mass index is 33.47 kg/m as calculated from the following:   Height as of this encounter: 5' 11"  (1.803 m).   Weight as of this encounter: 108.9 kg. Patient also counseled that weight may inhibit the healing process Patient counseled that losing weight will help with future health issues       West Pugh. Makaylin Carlo   PAC  06/07/2019, 8:04 AM

## 2019-06-07 NOTE — Progress Notes (Signed)
Physical Therapy Treatment Patient Details Name: [name] MRN: [id number] DOB: April 13, 1966 Today's Date: 06/07/2019    History of Present Illness 53 yo female s/p L TKR on 06/06/19. PMH includes crohn's disease, diverticulitis, HTN, OA, asthma, obesity.    PT Comments    Reviewed/practiced short distance ambulation to/from bathroom and performed ROM exercises. Pt's breakfast arrived so session was interrupted. Will return to ambulate in the hallway prior to d/c.    Follow Up Recommendations  Follow surgeon's recommendation for DC plan and follow-up therapies     Equipment Recommendations  None recommended by PT    Recommendations for Other Services       Precautions / Restrictions Precautions Precautions: Fall Restrictions Weight Bearing Restrictions: No LLE Weight Bearing: Weight bearing as tolerated    Mobility  Bed Mobility               General bed mobility comments: oob in recliner  Transfers Overall transfer level: Needs assistance Equipment used: Rolling walker (2 wheeled) Transfers: Sit to/from Stand Sit to Stand: Min guard         General transfer comment: Min guard for safety, verbal cuing for hand placement when rising.  Ambulation/Gait Ambulation/Gait assistance: Min guard Gait Distance (Feet): 15 Feet(x2) Assistive device: Rolling walker (2 wheeled) Gait Pattern/deviations: Step-to pattern;Decreased stride length;Step-through pattern     General Gait Details: Min guard for safety, verbal cuing for sequencing although pt progressing to step-through gait, placement in RW, upright posture.   Stairs             Wheelchair Mobility    Modified Rankin (Stroke Patients Only)       Balance                                            Cognition Arousal/Alertness: Awake/alert Behavior During Therapy: WFL for tasks assessed/performed Overall Cognitive Status: Within Functional Limits for tasks assessed                                         Exercises Total Joint Exercises Ankle Circles/Pumps: AROM;Both;10 reps;Seated Quad Sets: AROM;10 reps;Seated Hip ABduction/ADduction: AROM;Left;10 reps;Seated Straight Leg Raises: AROM;Left;10 reps;Seated Knee Flexion: AROM;Left;10 reps;Seated Goniometric ROM: ~10-70 degrees    General Comments        Pertinent Vitals/Pain Pain Score: 6  Pain Location: L knee Pain Descriptors / Indicators: Sore;Aching Pain Intervention(s): Monitored during session    Home Living                      Prior Function            PT Goals (current goals can now be found in the care plan section) Progress towards PT goals: Progressing toward goals    Frequency    7X/week      PT Plan Current plan remains appropriate    Co-evaluation              AM-PAC PT "6 Clicks" Mobility   Outcome Measure  Help needed turning from your back to your side while in a flat bed without using bedrails?: A Little Help needed moving from lying on your back to sitting on the side of a flat bed without using bedrails?: A Little Help needed moving to  and from a bed to a chair (including a wheelchair)?: A Little Help needed standing up from a chair using your arms (e.g., wheelchair or bedside chair)?: A Little Help needed to walk in hospital room?: A Little Help needed climbing 3-5 steps with a railing? : A Little 6 Click Score: 18    End of Session Equipment Utilized During Treatment: Gait belt Activity Tolerance: Patient tolerated treatment well Patient left: in chair;with call bell/phone within reach   PT Visit Diagnosis: Other abnormalities of gait and mobility (R26.89)     Time: 3654-2715 PT Time Calculation (min) (ACUTE ONLY): 24 min  Charges:  $Therapeutic Exercise: 8-22 mins                       Weston Anna, [place] Pager: [phone] Office: [phone]

## 2019-06-07 NOTE — Progress Notes (Signed)
Therapy Plan: OPPT Has DME

## 2019-06-07 NOTE — Discharge Summary (Signed)
Physician Discharge Summary  Patient ID: [name] MRN: [id number] DOB/AGE: 1965/10/21 53 y.o.  Admit date: 06/06/2019 Discharge date:  06/07/2019  Procedures:  Procedure(s) (LRB): TOTAL KNEE ARTHROPLASTY (Left)  Attending Physician:  Dr. Paralee Cancel   Admission Diagnoses:   Left knee primary OA / pain  Discharge Diagnoses:  Active Problems:   S/P left TKA   Status post total left knee replacement  Past Medical History:  Diagnosis Date  . Anxiety   . Arthritis   . Asthma   . Cervical hyperplasia    hisotry of  . Chest pain    cath 2005 norm cors, repeat cath Feb 2011 normal cors and only minimally  elevated pulmonary pressures  . Crohn disease ([place])   . Diverticulosis   . GERD (gastroesophageal reflux disease)   . Hiatal hernia   . History of iron deficiency anemia   . Hypertension   . Migraines   . Obesity   . Palpitations   . Pneumonia 2008   14 days hospitalization, bilateral  . PONV (postoperative nausea and vomiting)   . Sleep apnea sleep study 11/03/2009    cpap; does sometimes, doesnt use every night. weight loss no longer using CPAP    HPI:     [name], 53 y.o. female, has a history of pain and functional disability in the left knee due to arthritis and has failed non-surgical conservative treatments for greater than 12 weeks to include NSAID's and/or analgesics, corticosteriod injections, use of assistive devices, activity modification and PRP injections.  Onset of symptoms was gradual, starting October 2018  with gradually worsening course since that time. The patient noted prior procedures on the knee to include  arthroscopy on the left knee(s).  Patient currently rates pain in the left knee(s) at 9 out of 10 with activity. Patient has night pain, worsening of pain with activity and weight bearing, pain that interferes with activities of daily living, pain with passive range of motion, crepitus and joint swelling.  Patient has evidence of  periarticular osteophytes and joint space narrowing by imaging studies. There is no active infection.  Risks, benefits and expectations were discussed with the patient.  Risks including but not limited to the risk of anesthesia, blood clots, nerve damage, blood vessel damage, failure of the prosthesis, infection and up to and including death.  Patient understand the risks, benefits and expectations and wishes to proceed with surgery.  PCP: Sharilyn Sites, MD   Discharged Condition: good  Hospital Course:  Patient underwent the above stated procedure on 06/06/2019. Patient tolerated the procedure well and brought to the recovery room in good condition and subsequently to the floor.  POD #1 BP: 141/99 ; Pulse: 78 ; Temp: 97.8 F (36.6 C) ; Resp: 18 Patient reports pain as moderate, pain not fully controlled.  We will change the patient's from hydrocodone to oxycodone, to see if this helps control her pain better.  No events throughout the night.  Patient feels that she is already doing quite well and looking forward to continued improvement.  Dr. Alvan Dame did discuss the procedure, findings and expectations moving forward.  Patient is ready be discharged home. Dorsiflexion/plantar flexion intact, incision: dressing C/D/I, no cellulitis present and compartment soft.   LABS  Basename    HGB     10.6  HCT     34.3    Discharge Exam: General appearance: alert, cooperative and no distress Extremities: Homans sign is negative, no sign of DVT, no  edema, redness or tenderness in the calves or thighs and no ulcers, gangrene or trophic changes  Disposition: Home with follow up in 2 weeks   Follow-up Information    Paralee Cancel, MD. Schedule an appointment as soon as possible for a visit in 2 weeks.   Specialty: Orthopedic Surgery Contact information: [address] [place] [address] [phone]           Discharge Instructions    Call MD / Call 911   Complete by: As directed     If you experience chest pain or shortness of breath, CALL 911 and be transported to the hospital emergency room.  If you develope a fever above 101 F, pus (white drainage) or increased drainage or redness at the wound, or calf pain, call your surgeon's office.   Change dressing   Complete by: As directed    Maintain surgical dressing until follow up in the clinic. If the edges start to pull up, may reinforce with tape. If the dressing is no longer working, may remove and cover with gauze and tape, but must keep the area dry and clean.  Call with any questions or concerns.   Constipation Prevention   Complete by: As directed    Drink plenty of fluids.  Prune juice may be helpful.  You may use a stool softener, such as Colace (over the counter) 100 mg twice a day.  Use MiraLax (over the counter) for constipation as needed.   Diet - low sodium heart healthy   Complete by: As directed    Discharge instructions   Complete by: As directed    Maintain surgical dressing until follow up in the clinic. If the edges start to pull up, may reinforce with tape. If the dressing is no longer working, may remove and cover with gauze and tape, but must keep the area dry and clean.  Follow up in 2 weeks at [facility]. Call with any questions or concerns.   Increase activity slowly as tolerated   Complete by: As directed    Weight bearing as tolerated with assist device (walker, cane, etc) as directed, use it as long as suggested by your surgeon or therapist, typically at least 4-6 weeks.   TED hose   Complete by: As directed    Use stockings (TED hose) for 2 weeks on both leg(s).  You may remove them at night for sleeping.      Allergies as of 06/07/2019      Reactions   Iodinated Diagnostic Agents Anaphylaxis, Hives   Other Shortness Of Breath   Lemon grass   Wasp Venom Anaphylaxis, Shortness Of Breath   Pollen Extract Swelling   Adhesive [tape] Rash   Hydromorphone Hcl Nausea And Vomiting    Omnipaque [iohexol] Hives, Nausea Only   Pt. Was premedicated with emergent protocol.      Medication List    STOP taking these medications   acetaminophen 650 MG CR tablet Commonly known as: TYLENOL Replaced by: acetaminophen 500 MG tablet   diclofenac sodium 1 % Gel Commonly known as: VOLTAREN   ibuprofen 800 MG tablet Commonly known as: ADVIL   meloxicam 7.5 MG tablet Commonly known as: MOBIC   oxyCODONE-acetaminophen 5-325 MG tablet Commonly known as: Percocet     TAKE these medications   acetaminophen 500 MG tablet Commonly known as: TYLENOL Take 2 tablets (1,000 mg total) by mouth every 8 (eight) hours. Replaces: acetaminophen 650 MG CR tablet   Ventolin HFA  108 (90 Base) MCG/ACT inhaler Generic drug: albuterol Inhale 1-2 puffs into the lungs every 6 (six) hours as needed for wheezing or shortness of breath.   albuterol (2.5 MG/3ML) 0.083% nebulizer solution Commonly known as: PROVENTIL Take 2.5 mg by nebulization every 6 (six) hours as needed. For shortness of breath   amLODipine 5 MG tablet Commonly known as: NORVASC Take 5 mg by mouth at bedtime.   aspirin 81 MG chewable tablet Commonly known as: Aspirin Childrens Chew 1 tablet (81 mg total) by mouth 2 (two) times daily. Take for 4 weeks, then resume regular dose. Start taking on: June 08, 2019   budesonide 3 MG 24 hr capsule Commonly known as: ENTOCORT EC Take 1 capsule (3 mg total) by mouth daily. 9 mg daily for 2 weeks followed by 6 mg daily for 2 weeks and then 3 mg daily for 2 weeks   celecoxib 200 MG capsule Commonly known as: CeleBREX Take 1 capsule (200 mg total) by mouth daily.   cyclobenzaprine 10 MG tablet Commonly known as: FLEXERIL Take 1 tablet (10 mg total) by mouth 3 (three) times daily as needed for muscle spasms. What changed: See the new instructions.   diphenhydrAMINE 25 MG tablet Commonly known as: BENADRYL Take 25 mg by mouth every 6 (six) hours as needed for  allergies.   docusate sodium 100 MG capsule Commonly known as: Colace Take 1 capsule (100 mg total) by mouth 2 (two) times daily.   EPINEPHrine 0.3 mg/0.3 mL Soaj injection Commonly known as: EpiPen Inject 0.3 mLs (0.3 mg total) into the muscle once.   famotidine 40 MG tablet Commonly known as: Pepcid Take 1 tablet (40 mg total) by mouth at bedtime.   ferrous sulfate 325 (65 FE) MG tablet Commonly known as: FerrouSul Take 1 tablet (325 mg total) by mouth 3 (three) times daily with meals for 14 days.   gabapentin 300 MG capsule Commonly known as: NEURONTIN TAKE 1 CAPSULE BY MOUTH ONCE DAILY AT BEDTIME What changed: See the new instructions.   K2 PLUS D3 PO Take 2 capsules by mouth daily.   losartan 100 MG tablet Commonly known as: COZAAR Take 100 mg by mouth at bedtime.   Magnesium 500 MG Caps Take 2,000 mg by mouth at bedtime.   meclizine 25 MG tablet Commonly known as: ANTIVERT Take 25 mg by mouth 3 (three) times daily as needed for dizziness.   montelukast 10 MG tablet Commonly known as: SINGULAIR Take 10 mg by mouth at bedtime.   multivitamin tablet Take 1 tablet by mouth daily.   ondansetron 4 MG tablet Commonly known as: ZOFRAN Take 1 tablet (4 mg total) by mouth every 8 (eight) hours as needed for nausea or vomiting.   oxyCODONE 5 MG immediate release tablet Commonly known as: Oxy IR/ROXICODONE Take 1-2 tablets (5-10 mg total) by mouth every 4 (four) hours as needed for moderate pain or severe pain.   pantoprazole 40 MG tablet Commonly known as: PROTONIX TAKE (1) TABLET BY MOUTH TWICE DAILY BEFORE A MEAL. What changed: See the new instructions.   polyethylene glycol 17 g packet Commonly known as: MIRALAX / GLYCOLAX Take 17 g by mouth 2 (two) times daily.            Discharge Care Instructions  (From admission, onward)         Start     Ordered   06/07/19 0000  Change dressing    Comments: Maintain surgical dressing until follow up in the  clinic. If the edges start to pull up, may reinforce with tape. If the dressing is no longer working, may remove and cover with gauze and tape, but must keep the area dry and clean.  Call with any questions or concerns.   06/07/19 0813           Signed: West Pugh. Jady Braggs   PA-C  06/07/2019, 8:14 AM

## 2019-06-08 ENCOUNTER — Ambulatory Visit (HOSPITAL_COMMUNITY): Payer: PRIVATE HEALTH INSURANCE | Attending: Orthopedic Surgery | Admitting: Physical Therapy

## 2019-06-08 ENCOUNTER — Other Ambulatory Visit: Payer: Self-pay

## 2019-06-08 ENCOUNTER — Encounter (HOSPITAL_COMMUNITY): Payer: Self-pay | Admitting: Physical Therapy

## 2019-06-08 DIAGNOSIS — M25662 Stiffness of left knee, not elsewhere classified: Secondary | ICD-10-CM | POA: Diagnosis not present

## 2019-06-08 DIAGNOSIS — M25562 Pain in left knee: Secondary | ICD-10-CM | POA: Diagnosis present

## 2019-06-08 DIAGNOSIS — R2689 Other abnormalities of gait and mobility: Secondary | ICD-10-CM | POA: Insufficient documentation

## 2019-06-08 DIAGNOSIS — M6281 Muscle weakness (generalized): Secondary | ICD-10-CM | POA: Diagnosis present

## 2019-06-08 DIAGNOSIS — R29898 Other symptoms and signs involving the musculoskeletal system: Secondary | ICD-10-CM | POA: Insufficient documentation

## 2019-06-08 NOTE — Patient Instructions (Signed)
Heel Slide    Bend knee and pull heel toward buttocks. Hold _5___ seconds. Return. Repeat with other knee. Repeat __10__ times. Do _1-2___ sessions per day.  http://gt2.exer.us/372   Copyright  VHI. All rights reserved.    And Exercises from hospital

## 2019-06-08 NOTE — Therapy (Signed)
[facility] [address] [place], [place], [address] Phone: [phone]   Fax:  [phone]  Physical Therapy Evaluation  Patient Details  Name: [name] MRN: [id number] Date of Birth: 02-May-1966 Referring Provider (PT): Paralee Cancel, MD   Encounter Date: 06/08/2019  PT End of Session - 06/08/19 1841    Visit Number  1    Number of Visits  16    Date for PT Re-Evaluation  07/20/19   Mini re-assess 06/29/19   Authorization Type  [employer] (1 eval and 16 treatment sessions approved)    Authorization Time Period  06/08/19 - 07/20/19    Authorization - Visit Number  1    Authorization - Number of Visits  17    PT Start Time  1300    PT Stop Time  1345    PT Time Calculation (min)  45 min    Equipment Utilized During Treatment  Gait belt    Activity Tolerance  Patient tolerated treatment well    Behavior During Therapy  WFL for tasks assessed/performed       Past Medical History:  Diagnosis Date  . Anxiety   . Arthritis   . Asthma   . Cervical hyperplasia    hisotry of  . Chest pain    cath 2005 norm cors, repeat cath Feb 2011 normal cors and only minimally  elevated pulmonary pressures  . Crohn disease ([place])   . Diverticulosis   . GERD (gastroesophageal reflux disease)   . Hiatal hernia   . History of iron deficiency anemia   . Hypertension   . Migraines   . Obesity   . Palpitations   . Pneumonia 2008   14 days hospitalization, bilateral  . PONV (postoperative nausea and vomiting)   . Sleep apnea sleep study 11/03/2009    cpap; does sometimes, doesnt use every night. weight loss no longer using CPAP    Past Surgical History:  Procedure Laterality Date  . ABDOMINAL HYSTERECTOMY    . ANKLE ARTHROSCOPY  06/01/2012   Procedure: ANKLE ARTHROSCOPY;  Surgeon: Colin Rhein, MD;  Location: [facility];  Service: Orthopedics;  Laterality: Left;  left ankle arthorsocpy with extensive debridement and  gastroc slide  . ANKLE SURGERY Left 05/11/2018  . BIOPSY  04/26/2019   Procedure: BIOPSY;  Surgeon: Rogene Houston, MD;  [facility];  Service: Endoscopy;;  ileum colon  . CARDIAC CATHETERIZATION  11/22/2009 and 2005   WNL  . CESAREAN SECTION    . CHOLECYSTECTOMY  09/08/2006   lap. chole.  . CHONDROPLASTY  06/17/2012   Procedure: CHONDROPLASTY;  Surgeon: Carole Civil, MD;  Location: [facility];  Service: Orthopedics;  Laterality: Right;  right patella  . COLONOSCOPY N/A 04/26/2019   Procedure: COLONOSCOPY;  Surgeon: Rogene Houston, MD;  [facility];  Service: Endoscopy;  Laterality: N/A;  100  . ESOPHAGOGASTRODUODENOSCOPY N/A 05/14/2015   Procedure: ESOPHAGOGASTRODUODENOSCOPY (EGD);  Surgeon: Rogene Houston, MD;  [facility];  Service: Endoscopy;  Laterality: N/A;  730  . GIVENS CAPSULE STUDY N/A 05/24/2019   Procedure: GIVENS CAPSULE STUDY;  Surgeon: Rogene Houston, MD;  [facility];  Service: Endoscopy;  Laterality: N/A;  7:30  . INGUINAL HERNIA REPAIR  10/30/2008   right  . KNEE ARTHROSCOPY Right 2013  . KNEE ARTHROSCOPY WITH LATERAL MENISECTOMY Left 12/23/2016   Procedure: LEFT KNEE ARTHROSCOPY WITH LATERAL MENISECTOMY;  Surgeon: Tim Lair  Aline Brochure, MD;  Location: [facility];  Service: Orthopedics;  Laterality: Left;  . KNEE ARTHROSCOPY WITH MEDIAL MENISECTOMY Left 12/23/2016   Procedure: LEFT KNEE ARTHROSCOPY WITH MEDIAL MENISECTOMY CHONDROPLASTY PATELLA  AND MEDIAL FEMORAL CONDYLE LEFT KNEE;  Surgeon: Carole Civil, MD;  Location: [facility];  Service: Orthopedics;  Laterality: Left;  . SHOULDER ARTHROSCOPY WITH SUBACROMIAL DECOMPRESSION AND BICEP TENDON REPAIR Left 12/21/2017   Procedure: Left shoulder arthroscopic biceps tenodesis, SAD, DCR and labrum debridement;  Surgeon: Nicholes Stairs, MD;  Location: [facility];  Service: Orthopedics;  Laterality: Left;  120 mins  . SHOULDER SURGERY     right x 2   . TOTAL KNEE ARTHROPLASTY Left  06/06/2019   Procedure: TOTAL KNEE ARTHROPLASTY;  Surgeon: Paralee Cancel, MD;  Location: [facility];  Service: Orthopedics;  Laterality: Left;  70 mins    There were no vitals filed for this visit.   Subjective Assessment - 06/08/19 1314    Subjective  Patient had a fall at work on July 31, 2017 which resulted in several injuries on the left side of her body. Patient reported that she did have left ankle surgery 05/11/18. She reported a history of left RTC surgery March 2018.  Patient reported that she had a left TKA on 06/06/19 by Dr. Alvan Dame. She stated that she did have some issues with pain following surgery. She reported she previously had a left knee scope. Patient reported that she tried to do prehab before her knee surgery. Patient reported she is unable to sleep through the night any days of the week.    Pertinent History  Left TKA 06/06/19, see above for further surgical history    Limitations  Standing;Walking;House hold activities    How long can you sit comfortably?  Not limited    How long can you stand comfortably?  5 minutes or less    How long can you walk comfortably?  5 minutes or less    Patient Stated Goals  Improving walking    Currently in Pain?  Yes    Pain Score  6     Pain Location  Knee    Pain Orientation  Left    Pain Descriptors / Indicators  Aching    Pain Type  Surgical pain    Pain Onset  In the past 7 days    Aggravating Factors   Standing/walking    Pain Relieving Factors  Ice    Effect of Pain on Daily Activities  Moderately affects         [facility] PT Assessment - 06/08/19 0001      Assessment   Medical Diagnosis  S/P Left TKA    Referring Provider (PT)  Paralee Cancel, MD    Onset Date/Surgical Date  06/06/19    Next MD Visit  06/21/19    Prior Therapy  Yes for ankle      Balance Screen   Has the patient fallen in the past 6 months  No      [place] residence    Living Arrangements  Spouse/significant other    Type  of [place] Access  Level entry    [place] to live on main level with bedroom/bathroom    Home Equipment  Walker - 4 wheels;Crutches;Cane - single point      Prior Function   Level of Independence  Independent;Independent with basic ADLs    Vocation  --  Not holding job, home on [place]   Overall Cognitive Status  Within Functional Limits for tasks assessed      Observation/Other Assessments   Observations  Edema noted in left knee. No signs of excessive redness or bruising. No visible signs of infection or DVT.     Focus on Therapeutic Outcomes (FOTO)   Perform next session      Observation/Other Assessments-Edema    Edema  Circumferential      Circumferential Edema   Circumferential - Right  --   Measure next session     Sensation   Light Touch  Appears Intact      ROM / Strength   AROM / PROM / Strength  AROM;Strength      AROM   AROM Assessment Site  Knee    Right/Left Knee  Right;Left    Right Knee Extension  0    Right Knee Flexion  93    Left Knee Extension  7    Left Knee Flexion  64      Strength   Strength Assessment Site  Hip;Knee;Ankle    Right/Left Hip  Right;Left    Right Hip Flexion  4+/5    Right Hip Extension  4+/5    Right Hip ABduction  4+/5    Left Hip Flexion  3-/5    Left Hip Extension  2+/5    Left Hip ABduction  2+/5    Right/Left Knee  Right;Left    Right Knee Flexion  5/5    Right Knee Extension  5/5    Left Knee Flexion  4-/5    Left Knee Extension  2+/5    Right/Left Ankle  Right;Left    Right Ankle Dorsiflexion  5/5    Left Ankle Dorsiflexion  5/5      Palpation   Patella mobility  Hypomobile all directions left    Palpation comment  Tender all around knee      Transfers   Five time sit to stand comments   30 second chair rise: 3 times with UE      Ambulation/Gait   Ambulation/Gait  Yes    Ambulation Distance (Feet)  113 Feet    Assistive device  4-wheeled walker    Gait Pattern   Decreased stance time - left;Decreased step length - right;Antalgic    Ambulation Surface  Level;Indoor    Gait velocity  0.29 m/s      Balance   Balance Assessed  Yes      Static Standing Balance   Static Standing - Balance Support  No upper extremity supported    Static Standing Balance -  Activities   Single Leg Stance - Right Leg;Single Leg Stance - Left Leg    Static Standing - Comment/# of Minutes  4 seconds right; 0 seconds on the left                Objective measurements completed on examination: See above findings.              PT Education - 06/08/19 1841    Education Details  Examination findings, POC, and initial HEP.    Person(s) Educated  Patient    Methods  Explanation;Handout    Comprehension  Verbalized understanding       PT Short Term Goals - 06/08/19 1849      PT SHORT TERM GOAL #1   Title  Patient will demonstrate understanding and report regular compliance with  HEP to improve knee AROM, lower extremity strength, and overall functional mobility.    Time  3    Period  Weeks    Status  New    Target Date  06/29/19      PT SHORT TERM GOAL #2   Title  Patient will demonstrate left knee extension/flexion active range of motion of at least 5-95 degrees to assist with more normalized gait pattern and stair ambulation.    Time  3    Period  Weeks    Status  New    Target Date  06/29/19      PT SHORT TERM GOAL #3   Title  Patient will demonstrate improvement of 1/2 MMT grade in all deficient musculature to assist with proper gait mechanics.     Time  3    Period  Weeks    Status  New    Target Date  06/29/19        PT Long Term Goals - 06/08/19 1851      PT LONG TERM GOAL #1   Title  Patient will perform single limb stance on left/right lower extremity for 3 seconds in order to assist with stair ambulation.    Time  6    Period  Weeks    Status  New    Target Date  07/20/19      PT LONG TERM GOAL #2   Title  Patient will  demonstrate improvement of 1 MMT grade in all musculature tested as deficient at evaluation to improve gait mechanics and safety.    Time  6    Period  Weeks    Status  New    Target Date  07/20/19      PT LONG TERM GOAL #3   Title  Patient will improve ROM for left knee extension/flexion to 0-115 degrees to improve squatting, and other functional mobility.    Time  6    Period  Weeks    Status  New    Target Date  07/20/19      PT LONG TERM GOAL #4   Title  Patient will demonstrate ability to perform at least 8 sit to stands on the 30 second chair rise indicating improved balance and functional mobility.    Time  6    Period  Weeks    Status  New    Target Date  07/20/19      PT LONG TERM GOAL #5   Title  Patient will report ability to ambulate for 10 minutes with least restrictive assistive device with no greater than 3/10 pain.    Time  6    Period  Weeks    Status  New    Target Date  07/20/19      Additional Long Term Goals   Additional Long Term Goals  Yes      PT LONG TERM GOAL #6   Title  Patient will demonstrate ability to ambulate at a gait velocity of at least 0.8 m/s with LRAD on 2MWT indicating improved ability to ambulate safely household and limited community distances.    Time  6    Period  Weeks    Status  New    Target Date  07/20/19             Plan - 06/08/19 1859    Clinical Impression Statement  Patient presents to outpatient physical therapy following left TKA by Paralee Cancel on 06/06/19. Patient presents with typical  postoperative deficits including weakness, edema, decreased ROM, decreased mobility, decreased gait velocity, decreased balance, and pain. Patient is limited by a complex surgical history due to a fall at work as well as existing arthritis in the right knee which is also limited in ROM and which patient reports having pain in. Patient would benefit from skilled physical therpay in order to address the abovementioned deficits and help  her return to prior level of function.    Personal Factors and Comorbidities  Past/Current Experience    Examination-Activity Limitations  Stand;Locomotion Level;Bed Mobility;Bend;Transfers;Stairs;Squat    Examination-Participation Restrictions  Community Activity    Stability/Clinical Decision Making  Stable/Uncomplicated    Clinical Decision Making  Low    Rehab Potential  Good    PT Frequency  3x / week   3x/week for 3 weeks and 2x/week for 3 weeks   PT Duration  6 weeks    PT Treatment/Interventions  ADLs/Self Care Home Management;Cryotherapy;Electrical Stimulation;DME Instruction;Gait training;Stair training;Functional mobility training;Therapeutic activities;Moist Heat;Therapeutic exercise;Balance training;Neuromuscular re-education;Patient/family education;Orthotic Fit/Training;Manual techniques;Compression bandaging;Scar mobilization;Passive range of motion;Dry needling;Energy conservation;Taping    PT Next Visit Plan  Review eval goals and HEP. Measure edema and perform FOTO. FOcus on ROM decreasing edema. Progress to balance strengthening as able.    PT Home Exercise Plan  COntinue hospital exercises, supine heel slides 1-2x/day    Consulted and Agree with Plan of Care  Patient       Patient will benefit from skilled therapeutic intervention in order to improve the following deficits and impairments:  Abnormal gait, Increased fascial restricitons, Pain, Decreased mobility, Decreased activity tolerance, Decreased endurance, Decreased range of motion, Decreased strength, Hypomobility, Decreased balance, Difficulty walking, Increased edema, Impaired flexibility  Visit Diagnosis: Stiffness of left knee, not elsewhere classified  Acute pain of left knee  Muscle weakness (generalized)  Other symptoms and signs involving the musculoskeletal system  Other abnormalities of gait and mobility     Problem List Patient Active Problem List   Diagnosis Date Noted  . S/P left TKA  06/06/2019  . Status post total left knee replacement 06/06/2019  . Special screening for malignant neoplasms, colon 03/28/2019  . Acute diverticulitis 09/26/2018  . LLQ abdominal pain 09/26/2018  . Acute Sigmoid diverticulitis 09/26/2018  . Derangement of anterior horn of lateral meniscus of left knee   . Derangement of posterior horn of medial meniscus of left knee   . Chest pain 04/25/2016  . Hypokalemia 04/25/2016  . Hyperglycemia 04/25/2016  . Diverticulitis of colon 07/17/2014  . Nausea without vomiting 07/17/2014  . Crohn disease ([place]) 07/17/2014  . Diverticulitis of colon without hemorrhage 08/10/2013  . History of arthroscopy of right knee 07/25/2012  . Difficulty in walking(719.7) 06/29/2012  . Weakness of right leg 06/29/2012  . Posterior tibial tendonitis 11/11/2011  . PTTD (posterior tibial tendon dysfunction) 11/11/2011  . Knee pain, right 11/11/2011  . Chest pain 07/16/2011  . Primary osteoarthritis of left knee 02/25/2010  . PAIN IN JOINT, MULTIPLE SITES 02/25/2010  . Essential hypertension 08/13/2009  . HIATAL HERNIA 08/13/2009  . PALPITATIONS 08/13/2009  . DYSPNEA 08/13/2009   [name] PT, DPT 7:03 PM, 06/08/19 [facility] [place], [place], [address] Phone: [phone]   Fax:  [phone]  Name: [name] MRN: [id number] Date of Birth: 20-Apr-1966

## 2019-06-09 ENCOUNTER — Ambulatory Visit (HOSPITAL_COMMUNITY): Payer: PRIVATE HEALTH INSURANCE | Admitting: Physical Therapy

## 2019-06-09 ENCOUNTER — Encounter (HOSPITAL_COMMUNITY): Payer: Self-pay

## 2019-06-13 ENCOUNTER — Other Ambulatory Visit (HOSPITAL_COMMUNITY): Payer: Self-pay | Admitting: Orthopedic Surgery

## 2019-06-13 ENCOUNTER — Other Ambulatory Visit: Payer: Self-pay

## 2019-06-13 ENCOUNTER — Ambulatory Visit (HOSPITAL_COMMUNITY)
Admission: RE | Admit: 2019-06-13 | Discharge: 2019-06-13 | Disposition: A | Discharge status: Home / Self Care | Payer: PRIVATE HEALTH INSURANCE | Source: Ambulatory Visit | Attending: Orthopedic Surgery | Admitting: Orthopedic Surgery

## 2019-06-13 ENCOUNTER — Telehealth (INDEPENDENT_AMBULATORY_CARE_PROVIDER_SITE_OTHER): Payer: Self-pay | Admitting: Internal Medicine

## 2019-06-13 ENCOUNTER — Ambulatory Visit (HOSPITAL_COMMUNITY): Payer: PRIVATE HEALTH INSURANCE | Admitting: Physical Therapy

## 2019-06-13 ENCOUNTER — Encounter (HOSPITAL_COMMUNITY): Payer: Self-pay | Admitting: Physical Therapy

## 2019-06-13 DIAGNOSIS — M7989 Other specified soft tissue disorders: Secondary | ICD-10-CM | POA: Diagnosis present

## 2019-06-13 DIAGNOSIS — M25662 Stiffness of left knee, not elsewhere classified: Secondary | ICD-10-CM

## 2019-06-13 DIAGNOSIS — R2689 Other abnormalities of gait and mobility: Secondary | ICD-10-CM

## 2019-06-13 DIAGNOSIS — M6281 Muscle weakness (generalized): Secondary | ICD-10-CM

## 2019-06-13 DIAGNOSIS — M79605 Pain in left leg: Secondary | ICD-10-CM | POA: Diagnosis present

## 2019-06-13 DIAGNOSIS — M25562 Pain in left knee: Secondary | ICD-10-CM

## 2019-06-13 DIAGNOSIS — R29898 Other symptoms and signs involving the musculoskeletal system: Secondary | ICD-10-CM

## 2019-06-13 NOTE — Telephone Encounter (Signed)
Patient left message wanting Dr Laural Golden know that she is now having black stools

## 2019-06-13 NOTE — Progress Notes (Signed)
LLE venous duplex       has been completed. Preliminary results can be found under CV proc through chart review. June Leap, BS, RDMS, RVT    Called results to [place], [place]

## 2019-06-13 NOTE — Therapy (Signed)
[facility] [address] [place], [place], [address] Phone: [phone]   Fax:  [phone]  Physical Therapy Treatment  Patient Details  Name: [name] MRN: [id number] Date of Birth: [birth year] Referring Provider (PT): Paralee Cancel, MD   Encounter Date: 06/13/2019  PT End of Session - 06/13/19 1503    Visit Number  2    Number of Visits  16    Date for PT Re-Evaluation  07/20/19   Mini re-assess 06/29/19   Authorization Type  [employer] (1 eval and 16 treatment sessions approved)    Authorization Time Period  06/08/19 - 07/20/19    Authorization - Visit Number  2    Authorization - Number of Visits  17    PT Start Time  4944    PT Stop Time  1415   Some time unbilled while therapist communicated with patient's MD's office   PT Time Calculation (min)  67 min    Activity Tolerance  Patient tolerated treatment well    Behavior During Therapy  [facility] for tasks assessed/performed       Past Medical History:  Diagnosis Date  . Anxiety   . Arthritis   . Asthma   . Cervical hyperplasia    hisotry of  . Chest pain    cath 2005 norm cors, repeat cath Feb 2011 normal cors and only minimally  elevated pulmonary pressures  . Crohn disease ([place])   . Diverticulosis   . GERD (gastroesophageal reflux disease)   . Hiatal hernia   . History of iron deficiency anemia   . Hypertension   . Migraines   . Obesity   . Palpitations   . Pneumonia 2008   14 days hospitalization, bilateral  . PONV (postoperative nausea and vomiting)   . Sleep apnea sleep study 11/03/2009    cpap; does sometimes, doesnt use every night. weight loss no longer using CPAP    Past Surgical History:  Procedure Laterality Date  . ABDOMINAL HYSTERECTOMY    . ANKLE ARTHROSCOPY  06/01/2012   Procedure: ANKLE ARTHROSCOPY;  Surgeon: Colin Rhein, MD;  Location: [facility];  Service: Orthopedics;  Laterality: Left;  left ankle arthorsocpy with  extensive debridement and gastroc slide  . ANKLE SURGERY Left 05/11/2018  . BIOPSY  04/26/2019   Procedure: BIOPSY;  Surgeon: Rogene Houston, MD;  [facility];  Service: Endoscopy;;  ileum colon  . CARDIAC CATHETERIZATION  11/22/2009 and 2005   WNL  . CESAREAN SECTION    . CHOLECYSTECTOMY  09/08/2006   lap. chole.  . CHONDROPLASTY  06/17/2012   Procedure: CHONDROPLASTY;  Surgeon: Carole Civil, MD;  [facility];  Service: Orthopedics;  Laterality: Right;  right patella  . COLONOSCOPY N/A 04/26/2019   Procedure: COLONOSCOPY;  Surgeon: Rogene Houston, MD;  [facility];  Service: Endoscopy;  Laterality: N/A;  100  . ESOPHAGOGASTRODUODENOSCOPY N/A 05/14/2015   Procedure: ESOPHAGOGASTRODUODENOSCOPY (EGD);  Surgeon: Rogene Houston, MD;  [facility];  Service: Endoscopy;  Laterality: N/A;  730  . GIVENS CAPSULE STUDY N/A 05/24/2019   Procedure: GIVENS CAPSULE STUDY;  Surgeon: Rogene Houston, MD;  [facility];  Service: Endoscopy;  Laterality: N/A;  7:30  . INGUINAL HERNIA REPAIR  10/30/2008   right  . KNEE ARTHROSCOPY Right 2013  . KNEE ARTHROSCOPY WITH LATERAL MENISECTOMY Left 12/23/2016   Procedure: LEFT KNEE ARTHROSCOPY WITH LATERAL MENISECTOMY;  Surgeon: Dorothyann Peng  Vela Prose, MD;  Location: [facility];  Service: Orthopedics;  Laterality: Left;  . KNEE ARTHROSCOPY WITH MEDIAL MENISECTOMY Left 12/23/2016   Procedure: LEFT KNEE ARTHROSCOPY WITH MEDIAL MENISECTOMY CHONDROPLASTY PATELLA  AND MEDIAL FEMORAL CONDYLE LEFT KNEE;  Surgeon: Carole Civil, MD;  Location: [facility];  Service: Orthopedics;  Laterality: Left;  . SHOULDER ARTHROSCOPY WITH SUBACROMIAL DECOMPRESSION AND BICEP TENDON REPAIR Left 12/21/2017   Procedure: Left shoulder arthroscopic biceps tenodesis, SAD, DCR and labrum debridement;  Surgeon: Nicholes Stairs, MD;  Location: [facility];  Service: Orthopedics;  Laterality: Left;  120 mins  . SHOULDER SURGERY     right x 2   . TOTAL  KNEE ARTHROPLASTY Left 06/06/2019   Procedure: TOTAL KNEE ARTHROPLASTY;  Surgeon: Paralee Cancel, MD;  Location: [facility];  Service: Orthopedics;  Laterality: Left;  70 mins    There were no vitals filed for this visit.  Subjective Assessment - 06/13/19 1500    Subjective  Patient reporting that she noted new bruising. In addition, she reported that she has been feeling more fatigued. She stated that she has some tenderness in her left calf. She reported that she has also been having black stool.    Pertinent History  Left TKA 06/06/19, see above for further surgical history    Limitations  Standing;Walking;House hold activities    How long can you sit comfortably?  Not limited    How long can you stand comfortably?  5 minutes or less    How long can you walk comfortably?  5 minutes or less    Patient Stated Goals  Improving walking    Currently in Pain?  --   Patient reported tenderness in her left calf, but did not quantify pain   Pain Onset  In the past 7 days         [facility] PT Assessment - 06/13/19 0001      Observation/Other Assessments   Observations  Significant dark purple bruising in left posterior thigh and calf    Focus on Therapeutic Outcomes (FOTO)   Perform next session      Observation/Other Assessments-Edema    Edema  Circumferential      Circumferential Edema   Circumferential - Right  38 cm at mid calf    Circumferential - Left   41 cm at mid calf      Palpation   Palpation comment  Tender in left calf and some warmth felt surrounding knee and more on lateral side of calf.                            PT Education - 06/13/19 1502    Education Details  Discussed concerns for possible DVT and plan to call MD discussed goals.    Person(s) Educated  Patient    Methods  Explanation    Comprehension  Verbalized understanding       PT Short Term Goals - 06/13/19 1405      PT SHORT TERM GOAL #1   Title  Patient will demonstrate understanding and  report regular compliance with HEP to improve knee AROM, lower extremity strength, and overall functional mobility.    Time  3    Period  Weeks    Status  New    Target Date  06/29/19      PT SHORT TERM GOAL #2   Title  Patient will demonstrate left knee extension/flexion active range of motion of at  least 5-95 degrees to assist with more normalized gait pattern and stair ambulation.    Time  3    Period  Weeks    Status  New    Target Date  06/29/19      PT SHORT TERM GOAL #3   Title  Patient will demonstrate improvement of 1/2 MMT grade in all deficient musculature to assist with proper gait mechanics.     Time  3    Period  Weeks    Status  New    Target Date  06/29/19        PT Long Term Goals - 06/13/19 1405      PT LONG TERM GOAL #1   Title  Patient will perform single limb stance on left/right lower extremity for 3 seconds in order to assist with stair ambulation.    Time  6    Period  Weeks    Status  On-going      PT LONG TERM GOAL #2   Title  Patient will demonstrate improvement of 1 MMT grade in all musculature tested as deficient at evaluation to improve gait mechanics and safety.    Time  6    Period  Weeks    Status  On-going      PT LONG TERM GOAL #3   Title  Patient will improve ROM for left knee extension/flexion to 0-115 degrees to improve squatting, and other functional mobility.    Time  6    Period  Weeks    Status  On-going      PT LONG TERM GOAL #4   Title  Patient will demonstrate ability to perform at least 8 sit to stands on the 30 second chair rise indicating improved balance and functional mobility.    Time  6    Period  Weeks    Status  On-going      PT LONG TERM GOAL #5   Title  Patient will report ability to ambulate for 10 minutes with least restrictive assistive device with no greater than 3/10 pain.    Time  6    Period  Weeks    Status  On-going      PT LONG TERM GOAL #6   Title  Patient will demonstrate ability to ambulate at  a gait velocity of at least 0.8 m/s with LRAD on 2MWT indicating improved ability to ambulate safely household and limited community distances.    Time  6    Period  Weeks    Status  On-going            Plan - 06/13/19 1512    Clinical Impression Statement  Patient presented with increased bruising this session and reported increased tenderness in her left calf. Therapist examined patient's calf and performed the Well's Criteria with patient demonstrating greater than 2 points and an increased likelihood of having a DVT. For this reason, held all exercises this session and contacting patient's PA to discuss concerns. Also, informed PA of patient's reports of dark colored stool which he attributed most likely from the iron pills patient is taking. Educated patient on her goals this session and patient waited in clinic until an appointment was set to have an ultrasound completed to assess for possible DVT. Plan to resume more exercises once patient is cleared.    Personal Factors and Comorbidities  Past/Current Experience    Examination-Activity Limitations  Stand;Locomotion Level;Bed Mobility;Bend;Transfers;Stairs;Squat    Examination-Participation Restrictions  Community Activity  Stability/Clinical Decision Making  Stable/Uncomplicated    Rehab Potential  Good    PT Frequency  3x / week   3x/week for 3 weeks and 2x/week for 3 weeks   PT Duration  6 weeks    PT Treatment/Interventions  ADLs/Self Care Home Management;Cryotherapy;Electrical Stimulation;DME Instruction;Gait training;Stair training;Functional mobility training;Therapeutic activities;Moist Heat;Therapeutic exercise;Balance training;Neuromuscular re-education;Patient/family education;Orthotic Fit/Training;Manual techniques;Compression bandaging;Scar mobilization;Passive range of motion;Dry needling;Energy conservation;Taping    PT Next Visit Plan  Measure knee edema. F/U on results of ultrasound. Perform FOTO. FOcus on ROM  decreasing edema. Progress to balance strengthening as able.    PT Home Exercise Plan  COntinue hospital exercises, supine heel slides 1-2x/day    Consulted and Agree with Plan of Care  Patient       Patient will benefit from skilled therapeutic intervention in order to improve the following deficits and impairments:  Abnormal gait, Increased fascial restricitons, Pain, Decreased mobility, Decreased activity tolerance, Decreased endurance, Decreased range of motion, Decreased strength, Hypomobility, Decreased balance, Difficulty walking, Increased edema, Impaired flexibility  Visit Diagnosis: Stiffness of left knee, not elsewhere classified  Acute pain of left knee  Muscle weakness (generalized)  Other symptoms and signs involving the musculoskeletal system  Other abnormalities of gait and mobility     Problem List Patient Active Problem List   Diagnosis Date Noted  . S/P left TKA 06/06/2019  . Status post total left knee replacement 06/06/2019  . Special screening for malignant neoplasms, colon 03/28/2019  . Acute diverticulitis 09/26/2018  . LLQ abdominal pain 09/26/2018  . Acute Sigmoid diverticulitis 09/26/2018  . Derangement of anterior horn of lateral meniscus of left knee   . Derangement of posterior horn of medial meniscus of left knee   . Chest pain 04/25/2016  . Hypokalemia 04/25/2016  . Hyperglycemia 04/25/2016  . Diverticulitis of colon 07/17/2014  . Nausea without vomiting 07/17/2014  . Crohn disease ([place]) 07/17/2014  . Diverticulitis of colon without hemorrhage 08/10/2013  . History of arthroscopy of right knee 07/25/2012  . Difficulty in walking(719.7) 06/29/2012  . Weakness of right leg 06/29/2012  . Posterior tibial tendonitis 11/11/2011  . PTTD (posterior tibial tendon dysfunction) 11/11/2011  . Knee pain, right 11/11/2011  . Chest pain 07/16/2011  . Primary osteoarthritis of left knee 02/25/2010  . PAIN IN JOINT, MULTIPLE SITES 02/25/2010  .  Essential hypertension 08/13/2009  . HIATAL HERNIA 08/13/2009  . PALPITATIONS 08/13/2009  . DYSPNEA 08/13/2009   Clarene Critchley PT, DPT 3:15 PM, 06/13/19 [place], [place], [address] Phone: [phone]   Fax:  [phone]  Name: URVI IMES MRN: [id number] Date of Birth: 1966-02-09

## 2019-06-14 NOTE — Telephone Encounter (Signed)
Patient was called and given the recommendations of Carl Best ,NP and Dr.Rehman. If the patient is having light headedness,dizzy, shortness of breathe she needs to go to the ED for further evaluation.  Otherwise, she should be seen in the office 06/15/19 for further evaluation.  Patient was called and made aware, she will need to try and find transportation as she cannot drive right now. Her appointment is for 10 am and she will call the office first thing to confirm or make another appointment.

## 2019-06-15 MED FILL — LOSARTAN POTASSIUM 100 MG T: 100 | 30 days supply | Qty: 30 | Fill #0

## 2019-06-15 NOTE — Telephone Encounter (Signed)
Patient stated she would go have labs drawn tomorrow 9-11

## 2019-06-16 ENCOUNTER — Ambulatory Visit (HOSPITAL_COMMUNITY): Payer: PRIVATE HEALTH INSURANCE | Admitting: Physical Therapy

## 2019-06-16 ENCOUNTER — Encounter (HOSPITAL_COMMUNITY): Payer: Self-pay | Admitting: Physical Therapy

## 2019-06-16 ENCOUNTER — Other Ambulatory Visit: Payer: Self-pay

## 2019-06-16 DIAGNOSIS — R29898 Other symptoms and signs involving the musculoskeletal system: Secondary | ICD-10-CM | POA: Diagnosis present

## 2019-06-16 DIAGNOSIS — M25662 Stiffness of left knee, not elsewhere classified: Secondary | ICD-10-CM

## 2019-06-16 DIAGNOSIS — M25562 Pain in left knee: Secondary | ICD-10-CM | POA: Diagnosis present

## 2019-06-16 DIAGNOSIS — R2689 Other abnormalities of gait and mobility: Secondary | ICD-10-CM

## 2019-06-16 DIAGNOSIS — M6281 Muscle weakness (generalized): Secondary | ICD-10-CM

## 2019-06-16 NOTE — Patient Instructions (Signed)
[employer] a large can or rolled towel under leg. Straighten knee and leg. Hold _5___ seconds. Repeat with other leg. Repeat _10___ times. Do _2___ sessions per day.  http://gt2.exer.us/366   Copyright  VHI. All rights reserved.       Quad Set    With other leg bent, foot flat, slowly tighten muscles on thigh of straight leg while counting out loud to _5___. Repeat with other leg. Repeat _10___ times. Do __2__ sessions per day.  http://gt2.exer.us/276   Copyright  VHI. All rights reserved.  HIP / KNEE: Flexion, Heel Slides - Supine    Slide heel up toward buttocks, keeping leg in straight line. _10__ reps per set, _2__ sets per day, _7__ days per week Use towel or pillowcase under heel as needed.  Copyright  VHI. All rights reserved.

## 2019-06-16 NOTE — Therapy (Signed)
[place], [place], [address] Phone: [phone]   Fax:  [phone]  Physical Therapy Treatment  Patient Details  Name: [name] MRN: [id number] Date of Birth: 02/21/1966 Referring Provider (PT): Paralee Cancel, MD   Encounter Date: 06/16/2019  PT End of Session - 06/16/19 1008    Visit Number  3    Number of Visits  16    Date for PT Re-Evaluation  07/20/19   Mini re-assess 06/29/19   Authorization Type  [employer] (1 eval and 16 treatment sessions approved)    Authorization Time Period  06/08/19 - 07/20/19    Authorization - Visit Number  3    Authorization - Number of Visits  17    PT Start Time  0815    PT Stop Time  0902    PT Time Calculation (min)  47 min    Activity Tolerance  Patient tolerated treatment well    Behavior During Therapy  [facility] for tasks assessed/performed       Past Medical History:  Diagnosis Date  . Anxiety   . Arthritis   . Asthma   . Cervical hyperplasia    hisotry of  . Chest pain    cath 2005 norm cors, repeat cath Feb 2011 normal cors and only minimally  elevated pulmonary pressures  . Crohn disease ([place])   . Diverticulosis   . GERD (gastroesophageal reflux disease)   . Hiatal hernia   . History of iron deficiency anemia   . Hypertension   . Migraines   . Obesity   . Palpitations   . Pneumonia 2008   14 days hospitalization, bilateral  . PONV (postoperative nausea and vomiting)   . Sleep apnea sleep study 11/03/2009    cpap; does sometimes, doesnt use every night. weight loss no longer using CPAP    Past Surgical History:  Procedure Laterality Date  . ABDOMINAL HYSTERECTOMY    . ANKLE ARTHROSCOPY  06/01/2012   Procedure: ANKLE ARTHROSCOPY;  Surgeon: Colin Rhein, MD;  Location: [place];  Service: Orthopedics;  Laterality: Left;  left ankle arthorsocpy with extensive debridement and gastroc slide  . ANKLE SURGERY Left 05/11/2018  .  BIOPSY  04/26/2019   Procedure: BIOPSY;  Surgeon: Rogene Houston, MD;  [facility];  Service: Endoscopy;;  ileum colon  . CARDIAC CATHETERIZATION  11/22/2009 and 2005   WNL  . CESAREAN SECTION    . CHOLECYSTECTOMY  09/08/2006   lap. chole.  . CHONDROPLASTY  06/17/2012   Procedure: CHONDROPLASTY;  Surgeon: Carole Civil, MD;  [facility];  Service: Orthopedics;  Laterality: Right;  right patella  . COLONOSCOPY N/A 04/26/2019   Procedure: COLONOSCOPY;  Surgeon: Rogene Houston, MD;  [facility];  Service: Endoscopy;  Laterality: N/A;  100  . ESOPHAGOGASTRODUODENOSCOPY N/A 05/14/2015   Procedure: ESOPHAGOGASTRODUODENOSCOPY (EGD);  Surgeon: Rogene Houston, MD;  [facility];  Service: Endoscopy;  Laterality: N/A;  730  . GIVENS CAPSULE STUDY N/A 05/24/2019   Procedure: GIVENS CAPSULE STUDY;  Surgeon: Rogene Houston, MD;  [facility];  Service: Endoscopy;  Laterality: N/A;  7:30  . INGUINAL HERNIA REPAIR  10/30/2008   right  . KNEE ARTHROSCOPY Right 2013  . KNEE ARTHROSCOPY WITH LATERAL MENISECTOMY Left 12/23/2016   Procedure: LEFT KNEE ARTHROSCOPY WITH LATERAL MENISECTOMY;  Surgeon: Carole Civil, MD;  Location[facility];  Service: Orthopedics;  Laterality: Left;  . KNEE ARTHROSCOPY WITH MEDIAL MENISECTOMY Left 12/23/2016   Procedure: LEFT KNEE ARTHROSCOPY WITH MEDIAL MENISECTOMY CHONDROPLASTY PATELLA  AND MEDIAL FEMORAL CONDYLE LEFT KNEE;  Surgeon: Carole Civil, MD;  Location: [facility];  Service: Orthopedics;  Laterality: Left;  . SHOULDER ARTHROSCOPY WITH SUBACROMIAL DECOMPRESSION AND BICEP TENDON REPAIR Left 12/21/2017   Procedure: Left shoulder arthroscopic biceps tenodesis, SAD, DCR and labrum debridement;  Surgeon: Nicholes Stairs, MD;  Location: [place];  Service: Orthopedics;  Laterality: Left;  120 mins  . SHOULDER SURGERY     right x 2   . TOTAL KNEE ARTHROPLASTY Left 06/06/2019   Procedure: TOTAL KNEE ARTHROPLASTY;   Surgeon: Paralee Cancel, MD;  Location: [facility];  Service: Orthopedics;  Laterality: Left;  70 mins    There were no vitals filed for this visit.  Subjective Assessment - 06/16/19 0827    Subjective  Patient reported that she had an MD appointment today. She stated her pain is about a 5/10.    Pertinent History  Left TKA 06/06/19, see above for further surgical history    Limitations  Standing;Walking;House hold activities    How long can you sit comfortably?  Not limited    How long can you stand comfortably?  5 minutes or less    How long can you walk comfortably?  5 minutes or less    Patient Stated Goals  Improving walking    Currently in Pain?  Yes    Pain Score  5     Pain Location  Knee    Pain Orientation  Left    Pain Descriptors / Indicators  Aching    Pain Type  Surgical pain    Pain Onset  1 to 4 weeks ago         [facility] PT Assessment - 06/16/19 0001      Observation/Other Assessments   Focus on Therapeutic Outcomes (FOTO)   87% limited      Circumferential Edema   Circumferential - Left   50.5 cm at joint line                   [facility] Adult PT Treatment/Exercise - 06/16/19 0001      Exercises   Exercises  Knee/Hip      Knee/Hip Exercises: Supine   Quad Sets  Strengthening;Left;1 set;10 reps    Short Arc Quad Sets  Strengthening;Left;1 set;10 reps    Heel Slides  AROM;Left;1 set;10 reps    Heel Slides Limitations  Breathe and slide a little further each time    Knee Extension  AROM    Knee Extension Limitations  6    Knee Flexion  AROM    Knee Flexion Limitations  69      Manual Therapy   Manual Therapy  Edema management    Manual therapy comments  All manual completed separately from other skilled interventions    Edema Management  Patient supine with bilateral lower extremities elevated retrograde massage to decrease edema in left lower extremity             PT Education - 06/16/19 1007    Education Details  Discussed continuing  exercises provided at hospital and supplementing with added exercises from today's session as needed.    Person(s) Educated  Patient    Methods  Explanation;Handout    Comprehension  Verbalized understanding       PT Short Term Goals - 06/13/19 1405  PT SHORT TERM GOAL #1   Title  Patient will demonstrate understanding and report regular compliance with HEP to improve knee AROM, lower extremity strength, and overall functional mobility.    Time  3    Period  Weeks    Status  New    Target Date  06/29/19      PT SHORT TERM GOAL #2   Title  Patient will demonstrate left knee extension/flexion active range of motion of at least 5-95 degrees to assist with more normalized gait pattern and stair ambulation.    Time  3    Period  Weeks    Status  New    Target Date  06/29/19      PT SHORT TERM GOAL #3   Title  Patient will demonstrate improvement of 1/2 MMT grade in all deficient musculature to assist with proper gait mechanics.     Time  3    Period  Weeks    Status  New    Target Date  06/29/19        PT Long Term Goals - 06/13/19 1405      PT LONG TERM GOAL #1   Title  Patient will perform single limb stance on left/right lower extremity for 3 seconds in order to assist with stair ambulation.    Time  6    Period  Weeks    Status  On-going      PT LONG TERM GOAL #2   Title  Patient will demonstrate improvement of 1 MMT grade in all musculature tested as deficient at evaluation to improve gait mechanics and safety.    Time  6    Period  Weeks    Status  On-going      PT LONG TERM GOAL #3   Title  Patient will improve ROM for left knee extension/flexion to 0-115 degrees to improve squatting, and other functional mobility.    Time  6    Period  Weeks    Status  On-going      PT LONG TERM GOAL #4   Title  Patient will demonstrate ability to perform at least 8 sit to stands on the 30 second chair rise indicating improved balance and functional mobility.    Time  6     Period  Weeks    Status  On-going      PT LONG TERM GOAL #5   Title  Patient will report ability to ambulate for 10 minutes with least restrictive assistive device with no greater than 3/10 pain.    Time  6    Period  Weeks    Status  On-going      PT LONG TERM GOAL #6   Title  Patient will demonstrate ability to ambulate at a gait velocity of at least 0.8 m/s with LRAD on 2MWT indicating improved ability to ambulate safely household and limited community distances.    Time  6    Period  Weeks    Status  On-going            Plan - 06/16/19 1022    Clinical Impression Statement  Began session by measuring edema at patient's joint line and by performing FOTO assessment. Patient demonstrated 87% limitation with this. Progressed patient with supine exercises this session focusing on knee AROM. Discussed with patient continuing to monitor symptoms and home management of an updated HEP. Patient required increased time for all exercises. Ended session with manual therapy, a retrograde massage  to decrease edema in left lower extremity. Patient's knee flexion AROM improved to 69 degrees this date and knee extension AROM to 6 degrees. Patient would benefit from continued skilled physical therapy in order to continue progressing towards functional goals.    Personal Factors and Comorbidities  Past/Current Experience    Examination-Activity Limitations  Stand;Locomotion Level;Bed Mobility;Bend;Transfers;Stairs;Squat    Examination-Participation Restrictions  Community Activity    Stability/Clinical Decision Making  Stable/Uncomplicated    Rehab Potential  Good    PT Frequency  3x / week   3x/week for 3 weeks and 2x/week for 3 weeks   PT Duration  6 weeks    PT Treatment/Interventions  ADLs/Self Care Home Management;Cryotherapy;Electrical Stimulation;DME Instruction;Gait training;Stair training;Functional mobility training;Therapeutic activities;Moist Heat;Therapeutic exercise;Balance  training;Neuromuscular re-education;Patient/family education;Orthotic Fit/Training;Manual techniques;Compression bandaging;Scar mobilization;Passive range of motion;Dry needling;Energy conservation;Taping    PT Next Visit Plan  F/U on MD appointment. Gait training next session. Focus on ROM & decreasing edema. Then progress to balance strengthening as able once ROM normalized.    PT Home Exercise Plan  COntinue hospital exercises, supine heel slides 1-2x/day; 06/16/19: Supine heel slides, quad sets, SAQ 10x 1-2 x/day    Consulted and Agree with Plan of Care  Patient       Patient will benefit from skilled therapeutic intervention in order to improve the following deficits and impairments:  Abnormal gait, Increased fascial restricitons, Pain, Decreased mobility, Decreased activity tolerance, Decreased endurance, Decreased range of motion, Decreased strength, Hypomobility, Decreased balance, Difficulty walking, Increased edema, Impaired flexibility  Visit Diagnosis: Stiffness of left knee, not elsewhere classified  Acute pain of left knee  Muscle weakness (generalized)  Other symptoms and signs involving the musculoskeletal system  Other abnormalities of gait and mobility     Problem List Patient Active Problem List   Diagnosis Date Noted  . S/P left TKA 06/06/2019  . Status post total left knee replacement 06/06/2019  . Special screening for malignant neoplasms, colon 03/28/2019  . Acute diverticulitis 09/26/2018  . LLQ abdominal pain 09/26/2018  . Acute Sigmoid diverticulitis 09/26/2018  . Derangement of anterior horn of lateral meniscus of left knee   . Derangement of posterior horn of medial meniscus of left knee   . Chest pain 04/25/2016  . Hypokalemia 04/25/2016  . Hyperglycemia 04/25/2016  . Diverticulitis of colon 07/17/2014  . Nausea without vomiting 07/17/2014  . Crohn disease ([place]) 07/17/2014  . Diverticulitis of colon without hemorrhage 08/10/2013  . History of  arthroscopy of right knee 07/25/2012  . Difficulty in walking(719.7) 06/29/2012  . Weakness of right leg 06/29/2012  . Posterior tibial tendonitis 11/11/2011  . PTTD (posterior tibial tendon dysfunction) 11/11/2011  . Knee pain, right 11/11/2011  . Chest pain 07/16/2011  . Primary osteoarthritis of left knee 02/25/2010  . PAIN IN JOINT, MULTIPLE SITES 02/25/2010  . Essential hypertension 08/13/2009  . HIATAL HERNIA 08/13/2009  . PALPITATIONS 08/13/2009  . DYSPNEA 08/13/2009   [name] PT, [name] 10:26 AM, 06/16/19 [place], [place], [address] Phone: [phone]   Fax:  [phone]  Name: [name] MRN: [id number] Date of Birth: 1966/05/30

## 2019-06-19 ENCOUNTER — Ambulatory Visit (HOSPITAL_COMMUNITY): Payer: PRIVATE HEALTH INSURANCE

## 2019-06-19 ENCOUNTER — Other Ambulatory Visit: Payer: Self-pay

## 2019-06-19 ENCOUNTER — Encounter (HOSPITAL_COMMUNITY): Payer: Self-pay

## 2019-06-19 DIAGNOSIS — M25662 Stiffness of left knee, not elsewhere classified: Secondary | ICD-10-CM | POA: Diagnosis not present

## 2019-06-19 DIAGNOSIS — M25562 Pain in left knee: Secondary | ICD-10-CM

## 2019-06-19 DIAGNOSIS — R2689 Other abnormalities of gait and mobility: Secondary | ICD-10-CM

## 2019-06-19 DIAGNOSIS — R29898 Other symptoms and signs involving the musculoskeletal system: Secondary | ICD-10-CM

## 2019-06-19 DIAGNOSIS — M6281 Muscle weakness (generalized): Secondary | ICD-10-CM

## 2019-06-19 NOTE — Therapy (Signed)
[place], [place], [address] Phone: [phone]   Fax:  [phone]  Physical Therapy Treatment  Patient Details  Name: [name] MRN: [id number] Date of Birth: 1965-12-28 Referring Provider (PT): Paralee Cancel, MD   Encounter Date: 06/19/2019  PT End of Session - 06/19/19 1439    Visit Number  4    Number of Visits  16    Date for PT Re-Evaluation  07/20/19   Mini re-assess 06/29/19   Authorization Type  [employer] (1 eval and 16 treatment sessions approved)    Authorization Time Period  06/08/19 - 07/20/19    Authorization - Visit Number  4    Authorization - Number of Visits  17    PT Start Time  1430    PT Stop Time  1515    PT Time Calculation (min)  45 min    Activity Tolerance  Patient tolerated treatment well    Behavior During Therapy  [facility] for tasks assessed/performed       Past Medical History:  Diagnosis Date  . Anxiety   . Arthritis   . Asthma   . Cervical hyperplasia    hisotry of  . Chest pain    cath 2005 norm cors, repeat cath Feb 2011 normal cors and only minimally  elevated pulmonary pressures  . Crohn disease ([place])   . Diverticulosis   . GERD (gastroesophageal reflux disease)   . Hiatal hernia   . History of iron deficiency anemia   . Hypertension   . Migraines   . Obesity   . Palpitations   . Pneumonia 2008   14 days hospitalization, bilateral  . PONV (postoperative nausea and vomiting)   . Sleep apnea sleep study 11/03/2009    cpap; does sometimes, doesnt use every night. weight loss no longer using CPAP    Past Surgical History:  Procedure Laterality Date  . ABDOMINAL HYSTERECTOMY    . ANKLE ARTHROSCOPY  06/01/2012   Procedure: ANKLE ARTHROSCOPY;  Surgeon: Colin Rhein, MD;  Location: [place];  Service: Orthopedics;  Laterality: Left;  left ankle arthorsocpy with extensive debridement and gastroc slide  . ANKLE SURGERY Left 05/11/2018  .  BIOPSY  04/26/2019   Procedure: BIOPSY;  Surgeon: Rogene Houston, MD;  [facility];  Service: Endoscopy;;  ileum colon  . CARDIAC CATHETERIZATION  11/22/2009 and 2005   WNL  . CESAREAN SECTION    . CHOLECYSTECTOMY  09/08/2006   lap. chole.  . CHONDROPLASTY  06/17/2012   Procedure: CHONDROPLASTY;  Surgeon: Carole Civil, MD;  [facility];  Service: Orthopedics;  Laterality: Right;  right patella  . COLONOSCOPY N/A 04/26/2019   Procedure: COLONOSCOPY;  Surgeon: Rogene Houston, MD;  [facility];  Service: Endoscopy;  Laterality: N/A;  100  . ESOPHAGOGASTRODUODENOSCOPY N/A 05/14/2015   Procedure: ESOPHAGOGASTRODUODENOSCOPY (EGD);  Surgeon: Rogene Houston, MD;  [facility];  Service: Endoscopy;  Laterality: N/A;  730  . GIVENS CAPSULE STUDY N/A 05/24/2019   Procedure: GIVENS CAPSULE STUDY;  Surgeon: Rogene Houston, MD;  [facility];  Service: Endoscopy;  Laterality: N/A;  7:30  . INGUINAL HERNIA REPAIR  10/30/2008   right  . KNEE ARTHROSCOPY Right 2013  . KNEE ARTHROSCOPY WITH LATERAL MENISECTOMY Left 12/23/2016   Procedure: LEFT KNEE ARTHROSCOPY WITH LATERAL MENISECTOMY;  Surgeon: Carole Civil, MD;  [facility];  Service: Orthopedics;  Laterality: Left;  . KNEE ARTHROSCOPY WITH MEDIAL MENISECTOMY Left 12/23/2016   Procedure: LEFT KNEE ARTHROSCOPY WITH MEDIAL MENISECTOMY CHONDROPLASTY PATELLA  AND MEDIAL FEMORAL CONDYLE LEFT KNEE;  Surgeon: Carole Civil, MD;  [facility];  Service: Orthopedics;  Laterality: Left;  . SHOULDER ARTHROSCOPY WITH SUBACROMIAL DECOMPRESSION AND BICEP TENDON REPAIR Left 12/21/2017   Procedure: Left shoulder arthroscopic biceps tenodesis, SAD, DCR and labrum debridement;  Surgeon: Nicholes Stairs, MD;  Location: [place];  Service: Orthopedics;  Laterality: Left;  120 mins  . SHOULDER SURGERY     right x 2   . TOTAL KNEE ARTHROPLASTY Left 06/06/2019   Procedure: TOTAL KNEE ARTHROPLASTY;   Surgeon: Paralee Cancel, MD;  [facility];  Service: Orthopedics;  Laterality: Left;  70 mins    There were no vitals filed for this visit.  Subjective Assessment - 06/19/19 1432    Subjective  Pt reports pain 3/10, burning and stinging from getting out of truck. Pt reports taking tylenol prior to therapy.    Pertinent History  Left TKA 06/06/19, see above for further surgical history    Limitations  Standing;Walking;House hold activities    How long can you sit comfortably?  Not limited    How long can you stand comfortably?  5 minutes or less    How long can you walk comfortably?  5 minutes or less    Patient Stated Goals  Improving walking    Currently in Pain?  Yes    Pain Score  3     Pain Location  Knee    Pain Orientation  Left    Pain Descriptors / Indicators  Shooting    Pain Type  Surgical pain    Pain Onset  1 to 4 weeks ago    Aggravating Factors   standing/walking    Pain Relieving Factors  Ice    Effect of Pain on Daily Activities  Moderately affects            [facility] Adult PT Treatment/Exercise - 06/19/19 0001      Knee/Hip Exercises: Stretches   Other Knee/Hip Stretches  knee flexion drive on 6" step, O16 reps with 5 sec hold      Knee/Hip Exercises: Standing   Gait Training  gait training in gym, focus on heel-toe pattern to increase knee flexion and more fluid pattern, 2x50 ft      Knee/Hip Exercises: Seated   Heel Slides  Left;15 reps    Heel Slides Limitations  overpressure, alternating HS stretch with extension    Other Seated Knee/Hip Exercises  seated hip abd, BTB, 10 reps    Other Seated Knee/Hip Exercises  isometric glute contraction, 3 sec hold, 10 reps      Knee/Hip Exercises: Supine   Quad Sets  15 reps    Short Arc Quad Sets  10 reps;AAROM    Knee Extension  AROM    Knee Extension Limitations  6    Knee Flexion  AROM    Knee Flexion Limitations  74             PT Education - 06/19/19 1439    Education Details  Exercise  technique, continue HEP    Person(s) Educated  Patient    Methods  Explanation    Comprehension  Verbalized understanding       PT Short Term Goals - 06/13/19 1405      PT SHORT TERM GOAL #1   Title  Patient will demonstrate understanding  and report regular compliance with HEP to improve knee AROM, lower extremity strength, and overall functional mobility.    Time  3    Period  Weeks    Status  New    Target Date  06/29/19      PT SHORT TERM GOAL #2   Title  Patient will demonstrate left knee extension/flexion active range of motion of at least 5-95 degrees to assist with more normalized gait pattern and stair ambulation.    Time  3    Period  Weeks    Status  New    Target Date  06/29/19      PT SHORT TERM GOAL #3   Title  Patient will demonstrate improvement of 1/2 MMT grade in all deficient musculature to assist with proper gait mechanics.     Time  3    Period  Weeks    Status  New    Target Date  06/29/19        PT Long Term Goals - 06/13/19 1405      PT LONG TERM GOAL #1   Title  Patient will perform single limb stance on left/right lower extremity for 3 seconds in order to assist with stair ambulation.    Time  6    Period  Weeks    Status  On-going      PT LONG TERM GOAL #2   Title  Patient will demonstrate improvement of 1 MMT grade in all musculature tested as deficient at evaluation to improve gait mechanics and safety.    Time  6    Period  Weeks    Status  On-going      PT LONG TERM GOAL #3   Title  Patient will improve ROM for left knee extension/flexion to 0-115 degrees to improve squatting, and other functional mobility.    Time  6    Period  Weeks    Status  On-going      PT LONG TERM GOAL #4   Title  Patient will demonstrate ability to perform at least 8 sit to stands on the 30 second chair rise indicating improved balance and functional mobility.    Time  6    Period  Weeks    Status  On-going      PT LONG TERM GOAL #5   Title  Patient  will report ability to ambulate for 10 minutes with least restrictive assistive device with no greater than 3/10 pain.    Time  6    Period  Weeks    Status  On-going      PT LONG TERM GOAL #6   Title  Patient will demonstrate ability to ambulate at a gait velocity of at least 0.8 m/s with LRAD on 2MWT indicating improved ability to ambulate safely household and limited community distances.    Time  6    Period  Weeks    Status  On-going            Plan - 06/19/19 1448    Clinical Impression Statement  Continued with pt's established POC this date. Added knee drives on 6" step to increase flexion within tolerable range, pt with increased difficulty raising LLE onto step requiring UE assist to ultimately raise leg onto step due to weakness. Pt with fair quad activation for quad sets, but able to visually and tactilely confirm contraction. Pt with increased difficulty performing SAQ, completed with AAROM to achieve entire ROM and verbal cues to avoid  breath holding and compensations to achieve full knee extension with activity. Added hip abd with BTB in sitting and isometric glute sets to activate glutes to assist with gait mechanics. Gait training with cues for heel-toe pattern to increase fluid gait pattern, improve knee flexion in swing, and reduce compensations. Pt demonstrates occasional L LE minimal buckling, approximately 10 degrees, able to recover independently with rollator walk and no near falls. Pt with increased pain at EOS from 3 to 6 or 7/10. Pt L knee AROM 6-74 degrees this date. Continue to progress as able.    Personal Factors and Comorbidities  Past/Current Experience    Examination-Activity Limitations  Stand;Locomotion Level;Bed Mobility;Bend;Transfers;Stairs;Squat    Examination-Participation Restrictions  Community Activity    Stability/Clinical Decision Making  Stable/Uncomplicated    Rehab Potential  Good    PT Frequency  3x / week   3x/week for 3 weeks and 2x/week  for 3 weeks   PT Duration  6 weeks    PT Treatment/Interventions  ADLs/Self Care Home Management;Cryotherapy;Electrical Stimulation;DME Instruction;Gait training;Stair training;Functional mobility training;Therapeutic activities;Moist Heat;Therapeutic exercise;Balance training;Neuromuscular re-education;Patient/family education;Orthotic Fit/Training;Manual techniques;Compression bandaging;Scar mobilization;Passive range of motion;Dry needling;Energy conservation;Taping    PT Next Visit Plan  Consider e-stim for quad activation. Focus on ROM & decreasing edema. Continue gait training and quad activation. Then progress to balance strengthening as able once ROM normalized.    PT Home Exercise Plan  Continue hospital exercises, supine heel slides 1-2x/day; 06/16/19: Supine heel slides, quad sets, SAQ 10x 1-2 x/day    Consulted and Agree with Plan of Care  Patient       Patient will benefit from skilled therapeutic intervention in order to improve the following deficits and impairments:  Abnormal gait, Increased fascial restricitons, Pain, Decreased mobility, Decreased activity tolerance, Decreased endurance, Decreased range of motion, Decreased strength, Hypomobility, Decreased balance, Difficulty walking, Increased edema, Impaired flexibility  Visit Diagnosis: Stiffness of left knee, not elsewhere classified  Acute pain of left knee  Muscle weakness (generalized)  Other symptoms and signs involving the musculoskeletal system  Other abnormalities of gait and mobility     Problem List Patient Active Problem List   Diagnosis Date Noted  . S/P left TKA 06/06/2019  . Status post total left knee replacement 06/06/2019  . Special screening for malignant neoplasms, colon 03/28/2019  . Acute diverticulitis 09/26/2018  . LLQ abdominal pain 09/26/2018  . Acute Sigmoid diverticulitis 09/26/2018  . Derangement of anterior horn of lateral meniscus of left knee   . Derangement of posterior horn of  medial meniscus of left knee   . Chest pain 04/25/2016  . Hypokalemia 04/25/2016  . Hyperglycemia 04/25/2016  . Diverticulitis of colon 07/17/2014  . Nausea without vomiting 07/17/2014  . Crohn disease ([place]) 07/17/2014  . Diverticulitis of colon without hemorrhage 08/10/2013  . History of arthroscopy of right knee 07/25/2012  . Difficulty in walking(719.7) 06/29/2012  . Weakness of right leg 06/29/2012  . Posterior tibial tendonitis 11/11/2011  . PTTD (posterior tibial tendon dysfunction) 11/11/2011  . Knee pain, right 11/11/2011  . Chest pain 07/16/2011  . Primary osteoarthritis of left knee 02/25/2010  . PAIN IN JOINT, MULTIPLE SITES 02/25/2010  . Essential hypertension 08/13/2009  . HIATAL HERNIA 08/13/2009  . PALPITATIONS 08/13/2009  . DYSPNEA 08/13/2009      Talbot Grumbling PT, DPT 06/19/19, 3:39 PM [place], [place], [address] Phone: [phone]   Fax:  [phone]  Name: AALEAH HIRSCH MRN:  [id number] Date of Birth: 03-01-1966

## 2019-06-19 NOTE — Telephone Encounter (Signed)
[name], pls call patient and verify if she had labs done, refer to phone note below. I do not see results yet. thx

## 2019-06-19 NOTE — Telephone Encounter (Signed)
Talked with the patient. She states that she has been called and given appointment for tomorrow ,06/20/2019. No lab work has been done.

## 2019-06-19 NOTE — Telephone Encounter (Signed)
noted 

## 2019-06-19 NOTE — Telephone Encounter (Signed)
Please refer to small bowel given capsule study.

## 2019-06-20 ENCOUNTER — Ambulatory Visit (INDEPENDENT_AMBULATORY_CARE_PROVIDER_SITE_OTHER): Payer: 59 | Admitting: Nurse Practitioner

## 2019-06-20 ENCOUNTER — Encounter (INDEPENDENT_AMBULATORY_CARE_PROVIDER_SITE_OTHER): Payer: Self-pay | Admitting: Nurse Practitioner

## 2019-06-20 ENCOUNTER — Other Ambulatory Visit: Payer: Self-pay

## 2019-06-20 ENCOUNTER — Encounter (HOSPITAL_COMMUNITY): Payer: 59

## 2019-06-20 VITALS — BP 147/80 | HR 92 | Temp 98.6°F | Ht 71.0 in | Wt 240.2 lb

## 2019-06-20 DIAGNOSIS — D508 Other iron deficiency anemias: Secondary | ICD-10-CM | POA: Diagnosis not present

## 2019-06-20 DIAGNOSIS — K5000 Crohn's disease of small intestine without complications: Secondary | ICD-10-CM | POA: Diagnosis not present

## 2019-06-20 DIAGNOSIS — D509 Iron deficiency anemia, unspecified: Secondary | ICD-10-CM | POA: Insufficient documentation

## 2019-06-20 NOTE — Patient Instructions (Addendum)
1. Take Protonix 42m once daily and Famotidine 422mone tab at bed time, Budesonide and Celebrex  as prescribed by Dr. ReLaural Golden2. Complete the provided lab order today  3. Complete the provided stool card 3 days after you stop taking the Ferrous Sulfate  4. Call our office if your nausea worsen, take Ondansetron as needed  5. Further follow up to be determined after your lab results received

## 2019-06-20 NOTE — Progress Notes (Signed)
Subjective:    Patient ID: [name], female    DOB: 1965/10/15, 53 y.o.   MRN: [id number]  HPI patient is a 53 year old African-American female with a past medical history of ileal Crohn's disease initially diagnosed in 2011.  She is status post left knee total replacement surgery 06/06/2019.  She was found to have postoperative anemia. She was placed on ferrous sulfate 325 mg 3 times daily which she took for 7 days then reduced to bid. She reports having on and off right mid abdominal pain since February. However, she had increased right mid abdominal pain with nausea which started on 06/09/2019.  She also noticed that her stools were black. She started taking the ferrous sulfate on 06/07/2019.  She contacted Dr. Melony Overly who prescribed budesonide 9 mg p.o. daily, Protonix 40 mg twice daily and famotidine 40 mg at bedtime as she is requiring Celebrex 200 mg once daily post knee replacement surgery.  She typically avoids NSAIDs due to having Crohn's disease.  Passes 2 soft to loose stools daily which is her typical bowel pattern.  Her appetite has decreased.  No obvious weight loss.  No fever, sweats or chills.  Her most recent colonoscopy was 04/26/2019 identified a few erosions in the terminal ileum, 2 erosions in the sigmoid, diverticulosis to the sigmoid, hepatic flexure and a sending colon with external hemorrhoids. No other complaints today. She is a respiratory therapist at [employer] 06/07/2019: Hemoglobin 10.6 (Hg 12.9 on 06/01/2019). Hematocrit 34.3.  MCV 91.7.  Platelet 291. Labs 06/20/2019: Hemoglobin 10.7.  Hematocrit 31.8.  MCV 85.0.  Platelet 526.   Past Medical History:  Diagnosis Date  . Anxiety   . Arthritis   . Asthma   . Cervical hyperplasia    hisotry of  . Chest pain    cath 2005 norm cors, repeat cath Feb 2011 normal cors and only minimally  elevated pulmonary pressures  . Crohn disease ([place])   . Diverticulosis   . GERD (gastroesophageal reflux disease)   .  Hiatal hernia   . History of iron deficiency anemia   . Hypertension   . Migraines   . Obesity   . Palpitations   . Pneumonia 2008   14 days hospitalization, bilateral  . PONV (postoperative nausea and vomiting)   . Sleep apnea sleep study 11/03/2009    cpap; does sometimes, doesnt use every night. weight loss no longer using CPAP   Past Surgical History:  Procedure Laterality Date  . ABDOMINAL HYSTERECTOMY    . ANKLE ARTHROSCOPY  06/01/2012   Procedure: ANKLE ARTHROSCOPY;  Surgeon: Colin Rhein, MD;  Location: [place];  Service: Orthopedics;  Laterality: Left;  left ankle arthorsocpy with extensive debridement and gastroc slide  . ANKLE SURGERY Left 05/11/2018  . BIOPSY  04/26/2019   Procedure: BIOPSY;  Surgeon: Rogene Houston, MD;  [facility];  Service: Endoscopy;;  ileum colon  . CARDIAC CATHETERIZATION  11/22/2009 and 2005   WNL  . CESAREAN SECTION    . CHOLECYSTECTOMY  09/08/2006   lap. chole.  . CHONDROPLASTY  06/17/2012   Procedure: CHONDROPLASTY;  Surgeon: Carole Civil, MD;  [facility];  Service: Orthopedics;  Laterality: Right;  right patella  . COLONOSCOPY N/A 04/26/2019   Procedure: COLONOSCOPY;  Surgeon: Rogene Houston, MD;  [facility];  Service: Endoscopy;  Laterality: N/A;  100  . ESOPHAGOGASTRODUODENOSCOPY N/A 05/14/2015   Procedure: ESOPHAGOGASTRODUODENOSCOPY (EGD);  Surgeon: Bernadene Person  Gloriann Loan, MD;  [facility];  Service: Endoscopy;  Laterality: N/A;  730  . GIVENS CAPSULE STUDY N/A 05/24/2019   Procedure: GIVENS CAPSULE STUDY;  Surgeon: Rogene Houston, MD;  [facility];  Service: Endoscopy;  Laterality: N/A;  7:30  . INGUINAL HERNIA REPAIR  10/30/2008   right  . KNEE ARTHROSCOPY Right 2013  . KNEE ARTHROSCOPY WITH LATERAL MENISECTOMY Left 12/23/2016   Procedure: LEFT KNEE ARTHROSCOPY WITH LATERAL MENISECTOMY;  Surgeon: Carole Civil, MD;  [facility];  Service: Orthopedics;   Laterality: Left;  . KNEE ARTHROSCOPY WITH MEDIAL MENISECTOMY Left 12/23/2016   Procedure: LEFT KNEE ARTHROSCOPY WITH MEDIAL MENISECTOMY CHONDROPLASTY PATELLA  AND MEDIAL FEMORAL CONDYLE LEFT KNEE;  Surgeon: Carole Civil, MD;  [facility];  Service: Orthopedics;  Laterality: Left;  . SHOULDER ARTHROSCOPY WITH SUBACROMIAL DECOMPRESSION AND BICEP TENDON REPAIR Left 12/21/2017   Procedure: Left shoulder arthroscopic biceps tenodesis, SAD, DCR and labrum debridement;  Surgeon: Nicholes Stairs, MD;  Location: [facility];  Service: Orthopedics;  Laterality: Left;  120 mins  . SHOULDER SURGERY     right x 2   . TOTAL KNEE ARTHROPLASTY Left 06/06/2019   Procedure: TOTAL KNEE ARTHROPLASTY;  Surgeon: Paralee Cancel, MD;  [facility];  Service: Orthopedics;  Laterality: Left;  70 mins    Current Outpatient Medications on File Prior to Visit  Medication Sig Dispense Refill  . acetaminophen (TYLENOL) 500 MG tablet Take 2 tablets (1,000 mg total) by mouth every 8 (eight) hours. 30 tablet 0  . albuterol (PROVENTIL) (2.5 MG/3ML) 0.083% nebulizer solution Take 2.5 mg by nebulization every 6 (six) hours as needed. For shortness of breath    . albuterol (VENTOLIN HFA) 108 (90 Base) MCG/ACT inhaler Inhale 1-2 puffs into the lungs every 6 (six) hours as needed for wheezing or shortness of breath.    Marland Kitchen amLODipine (NORVASC) 5 MG tablet Take 5 mg by mouth at bedtime.    Marland Kitchen aspirin (ASPIRIN CHILDRENS) 81 MG chewable tablet Chew 1 tablet (81 mg total) by mouth 2 (two) times daily. Take for 4 weeks, then resume regular dose. 60 tablet 0  . budesonide (ENTOCORT EC) 3 MG 24 hr capsule Take 1 capsule (3 mg total) by mouth daily. 9 mg daily for 2 weeks followed by 6 mg daily for 2 weeks and then 3 mg daily for 2 weeks 90 capsule 0  . celecoxib (CELEBREX) 200 MG capsule Take 1 capsule (200 mg total) by mouth daily. 30 capsule 0  . cyclobenzaprine (FLEXERIL) 10 MG tablet Take 1 tablet (10 mg total) by mouth 3 (three)  times daily as needed for muscle spasms. 40 tablet 0  . diphenhydrAMINE (BENADRYL) 25 MG tablet Take 25 mg by mouth every 6 (six) hours as needed for allergies.    Marland Kitchen docusate sodium (COLACE) 100 MG capsule Take 1 capsule (100 mg total) by mouth 2 (two) times daily. 28 capsule 0  . EPINEPHrine (EPIPEN) 0.3 mg/0.3 mL SOAJ injection Inject 0.3 mLs (0.3 mg total) into the muscle once. 1 Device 0  . famotidine (PEPCID) 40 MG tablet Take 1 tablet (40 mg total) by mouth at bedtime. 90 tablet 1  . ferrous sulfate (FERROUSUL) 325 (65 FE) MG tablet Take 1 tablet (325 mg total) by mouth 3 (three) times daily with meals for 14 days. 42 tablet 0  . gabapentin (NEURONTIN) 300 MG capsule TAKE 1 CAPSULE BY MOUTH ONCE DAILY AT BEDTIME (Patient taking differently: Take  300 mg by mouth at bedtime. ) 30 capsule 2  . losartan (COZAAR) 100 MG tablet Take 100 mg by mouth at bedtime.     . Magnesium 500 MG CAPS Take 2,000 mg by mouth at bedtime.     . meclizine (ANTIVERT) 25 MG tablet Take 25 mg by mouth 3 (three) times daily as needed for dizziness.    . montelukast (SINGULAIR) 10 MG tablet Take 10 mg by mouth at bedtime.    . Multiple Vitamin (MULTIVITAMIN) tablet Take 1 tablet by mouth daily.    . ondansetron (ZOFRAN) 4 MG tablet Take 1 tablet (4 mg total) by mouth every 8 (eight) hours as needed for nausea or vomiting. 20 tablet 0  . oxyCODONE (OXY IR/ROXICODONE) 5 MG immediate release tablet Take 1-2 tablets (5-10 mg total) by mouth every 4 (four) hours as needed for moderate pain or severe pain. 60 tablet 0  . pantoprazole (PROTONIX) 40 MG tablet TAKE (1) TABLET BY MOUTH TWICE DAILY BEFORE A MEAL. (Patient taking differently: Take 40 mg by mouth at bedtime. ) 60 tablet 1  . polyethylene glycol (MIRALAX / GLYCOLAX) 17 g packet Take 17 g by mouth 2 (two) times daily. 28 packet 0  . Vitamin D-Vitamin K (K2 PLUS D3 PO) Take 2 capsules by mouth daily.     No current facility-administered medications on file prior to  visit.    Allergies  Allergen Reactions  . Iodinated Diagnostic Agents Anaphylaxis and Hives  . Other Shortness Of Breath    Lemon grass  . Wasp Venom Anaphylaxis and Shortness Of Breath  . Pollen Extract Swelling  . Adhesive [Tape] Rash  . Hydromorphone Hcl Nausea And Vomiting  . Omnipaque [Iohexol] Hives and Nausea Only    Pt. Was premedicated with emergent protocol.   Review of Systems see HPI, all other systems reviewed and are negative    Objective:   Physical Exam  BP (!) 147/80   Pulse 92   Temp 98.6 F (37 C)   Ht 5' 11"  (1.803 m)   Wt 240 lb 3.2 oz (109 kg)   BMI 33.50 kg/m  General: 53 year old female well-developed walking with assistance of a walker in no acute distress Eyes: Sclera nonicteric, conjunctiva pink Mouth: Several missing dentition, no ulcers or lesions Neck: Supple, thick neck without lymphadenopathy or thyromegaly Heart: Regular rate and rhythm, no murmurs Lungs: Breath sounds clear throughout Abdomen: Soft, mild right mid abdominal tenderness with deep palpation without rebound or guarding, positive bowel sounds to all 4 quadrants, no HSM Extremities: Left knee with bandage intact with moderate swelling consistent with s/p knee replacement surgery Neuro: Alert and oriented x4     Assessment & Plan:   62.  53 year old female with a history of ileal Crohn's disease status post left knee total replacement surgery with iron deficiency anemia and reports of black stool since starting ferrous sulfate 3 times daily then reduced to twice daily. -Hold her sulfate for 3 days then complete heme slides -CBC today -Continue budesonide 9 mg daily -Patient reports Dr. Laural Golden approved short-term Celebrex as long as the patient takes Protonix 40 mg twice daily and Famotidine 40 mg at bedtime -Discussed scheduling abdominal/pelvic CT if her abdominal pain persists or worsen -Further follow-up to be determined after repeat CBC results reviewed and heme slides  obtained

## 2019-06-21 ENCOUNTER — Ambulatory Visit (HOSPITAL_COMMUNITY): Payer: PRIVATE HEALTH INSURANCE | Admitting: Physical Therapy

## 2019-06-21 ENCOUNTER — Encounter (HOSPITAL_COMMUNITY): Payer: Self-pay | Admitting: Physical Therapy

## 2019-06-21 DIAGNOSIS — M25662 Stiffness of left knee, not elsewhere classified: Secondary | ICD-10-CM | POA: Diagnosis not present

## 2019-06-21 DIAGNOSIS — R29898 Other symptoms and signs involving the musculoskeletal system: Secondary | ICD-10-CM

## 2019-06-21 DIAGNOSIS — R2689 Other abnormalities of gait and mobility: Secondary | ICD-10-CM

## 2019-06-21 DIAGNOSIS — M25562 Pain in left knee: Secondary | ICD-10-CM

## 2019-06-21 DIAGNOSIS — M6281 Muscle weakness (generalized): Secondary | ICD-10-CM

## 2019-06-21 LAB — IRON,TIBC AND FERRITIN PANEL
%SAT: 23 % (calc) (ref 16–45)
Ferritin: 222 ng/mL (ref 16–232)
Iron: 64 ug/dL (ref 45–160)
TIBC: 281 mcg/dL (calc) (ref 250–450)

## 2019-06-21 LAB — CBC WITH DIFFERENTIAL/PLATELET
Absolute Monocytes: 403 cells/uL (ref 200–950)
Basophils Absolute: 38 cells/uL (ref 0–200)
Basophils Relative: 0.6 %
Eosinophils Absolute: 221 cells/uL (ref 15–500)
Eosinophils Relative: 3.5 %
HCT: 31.8 % — ABNORMAL LOW (ref 35.0–45.0)
Hemoglobin: 10.7 g/dL — ABNORMAL LOW (ref 11.7–15.5)
Lymphs Abs: 1266 cells/uL (ref 850–3900)
MCH: 28.6 pg (ref 27.0–33.0)
MCHC: 33.6 g/dL (ref 32.0–36.0)
MCV: 85 fL (ref 80.0–100.0)
MPV: 9.6 fL (ref 7.5–12.5)
Monocytes Relative: 6.4 %
Neutro Abs: 4372 cells/uL (ref 1500–7800)
Neutrophils Relative %: 69.4 %
Platelets: 526 10*3/uL — ABNORMAL HIGH (ref 140–400)
RBC: 3.74 10*6/uL — ABNORMAL LOW (ref 3.80–5.10)
RDW: 14 % (ref 11.0–15.0)
Total Lymphocyte: 20.1 %
WBC: 6.3 10*3/uL (ref 3.8–10.8)

## 2019-06-21 NOTE — Therapy (Signed)
[place], [place], [address] Phone: [phone]   Fax:  [phone]  Physical Therapy Treatment  Patient Details  Name: [name] MRN: [id number] Date of Birth: 10-Feb-1966 Referring Provider (PT): Paralee Cancel, MD   Encounter Date: 06/21/2019  PT End of Session - 06/21/19 1211    Visit Number  5    Number of Visits  16    Date for PT Re-Evaluation  07/20/19   Mini re-assess 06/29/19   Authorization Type  [employer] (1 eval and 16 treatment sessions approved)    Authorization Time Period  06/08/19 - 07/20/19    Authorization - Visit Number  5    Authorization - Number of Visits  17    PT Start Time  6579    PT Stop Time  1204    PT Time Calculation (min)  40 min    Activity Tolerance  Patient tolerated treatment well    Behavior During Therapy  [facility] for tasks assessed/performed       Past Medical History:  Diagnosis Date  . Anxiety   . Arthritis   . Asthma   . Cervical hyperplasia    hisotry of  . Chest pain    cath 2005 norm cors, repeat cath Feb 2011 normal cors and only minimally  elevated pulmonary pressures  . Crohn disease ([place])   . Diverticulosis   . GERD (gastroesophageal reflux disease)   . Hiatal hernia   . History of iron deficiency anemia   . Hypertension   . Migraines   . Obesity   . Palpitations   . Pneumonia 2008   14 days hospitalization, bilateral  . PONV (postoperative nausea and vomiting)   . Sleep apnea sleep study 11/03/2009    cpap; does sometimes, doesnt use every night. weight loss no longer using CPAP    Past Surgical History:  Procedure Laterality Date  . ABDOMINAL HYSTERECTOMY    . ANKLE ARTHROSCOPY  06/01/2012   Procedure: ANKLE ARTHROSCOPY;  Surgeon: Colin Rhein, MD;  Location: [place];  Service: Orthopedics;  Laterality: Left;  left ankle arthorsocpy with extensive debridement and gastroc slide  . ANKLE SURGERY Left 05/11/2018  .  BIOPSY  04/26/2019   Procedure: BIOPSY;  Surgeon: Rogene Houston, MD;  [facility];  Service: Endoscopy;;  ileum colon  . CARDIAC CATHETERIZATION  11/22/2009 and 2005   WNL  . CESAREAN SECTION    . CHOLECYSTECTOMY  09/08/2006   lap. chole.  . CHONDROPLASTY  06/17/2012   Procedure: CHONDROPLASTY;  Surgeon: Carole Civil, MD;  [facility];  Service: Orthopedics;  Laterality: Right;  right patella  . COLONOSCOPY N/A 04/26/2019   Procedure: COLONOSCOPY;  Surgeon: Rogene Houston, MD;  [facility];  Service: Endoscopy;  Laterality: N/A;  100  . ESOPHAGOGASTRODUODENOSCOPY N/A 05/14/2015   Procedure: ESOPHAGOGASTRODUODENOSCOPY (EGD);  Surgeon: Rogene Houston, MD;  [facility];  Service: Endoscopy;  Laterality: N/A;  730  . GIVENS CAPSULE STUDY N/A 05/24/2019   Procedure: GIVENS CAPSULE STUDY;  Surgeon: Rogene Houston, MD;  [facility];  Service: Endoscopy;  Laterality: N/A;  7:30  . INGUINAL HERNIA REPAIR  10/30/2008   right  . KNEE ARTHROSCOPY Right 2013  . KNEE ARTHROSCOPY WITH LATERAL MENISECTOMY Left 12/23/2016   Procedure: LEFT KNEE ARTHROSCOPY WITH LATERAL MENISECTOMY;  Surgeon: Carole Civil, MD;  [facility];  Service: Orthopedics;  Laterality: Left;  . KNEE ARTHROSCOPY WITH MEDIAL MENISECTOMY Left 12/23/2016   Procedure: LEFT KNEE ARTHROSCOPY WITH MEDIAL MENISECTOMY CHONDROPLASTY PATELLA  AND MEDIAL FEMORAL CONDYLE LEFT KNEE;  Surgeon: Carole Civil, MD;  Location[facility];  Service: Orthopedics;  Laterality: Left;  . SHOULDER ARTHROSCOPY WITH SUBACROMIAL DECOMPRESSION AND BICEP TENDON REPAIR Left 12/21/2017   Procedure: Left shoulder arthroscopic biceps tenodesis, SAD, DCR and labrum debridement;  Surgeon: Nicholes Stairs, MD;  Location: [place];  Service: Orthopedics;  Laterality: Left;  120 mins  . SHOULDER SURGERY     right x 2   . TOTAL KNEE ARTHROPLASTY Left 06/06/2019   Procedure: TOTAL KNEE ARTHROPLASTY;   Surgeon: Paralee Cancel, MD;  Location: [facility];  Service: Orthopedics;  Laterality: Left;  70 mins    There were no vitals filed for this visit.  Subjective Assessment - 06/21/19 1127    Subjective  Patient reported 4/10 pain currently. Reported having MD appointment later today for 2 week follow-up.    Pertinent History  Left TKA 06/06/19, see above for further surgical history    Limitations  Standing;Walking;House hold activities    How long can you sit comfortably?  Not limited    How long can you stand comfortably?  5 minutes or less    How long can you walk comfortably?  5 minutes or less    Patient Stated Goals  Improving walking    Currently in Pain?  Yes    Pain Score  4     Pain Location  Knee    Pain Orientation  Left    Pain Descriptors / Indicators  Burning    Pain Type  Surgical pain    Pain Onset  1 to 4 weeks ago                       [facility] Adult PT Treatment/Exercise - 06/21/19 0001      Knee/Hip Exercises: Stretches   Active Hamstring Stretch  Left;Other (comment)    Active Hamstring Stretch Limitations  10x10'' on 6'' box    Gastroc Stretch  Both;3 reps;30 seconds    Gastroc Stretch Limitations  Slant board    Other Knee/Hip Stretches  knee flexion drive on 6" step, Y30 reps with 10 sec hold      Knee/Hip Exercises: Standing   Heel Raises  Both;1 set;10 reps    Heel Raises Limitations  Toe raises x 10    Gait Training  Ambualting 200 feet with Rollator cues for upright posture      Knee/Hip Exercises: Supine   Quad Sets  15 reps    Heel Slides  AROM;Left;1 set;10 reps    Knee Extension  AROM    Knee Extension Limitations  5    Knee Flexion  AROM    Knee Flexion Limitations  75      Manual Therapy   Manual Therapy  Edema management    Manual therapy comments  All manual completed separately from other skilled interventions    Edema Management  Patient supine with bilateral lower extremities elevated retrograde massage to decrease edema in  left lower extremity             PT Education - 06/21/19 1211    Education Details  Continue HEP and progress with ROM.    Person(s) Educated  Patient    Methods  Explanation    Comprehension  Verbalized understanding       PT Short  Term Goals - 06/13/19 1405      PT SHORT TERM GOAL #1   Title  Patient will demonstrate understanding and report regular compliance with HEP to improve knee AROM, lower extremity strength, and overall functional mobility.    Time  3    Period  Weeks    Status  New    Target Date  06/29/19      PT SHORT TERM GOAL #2   Title  Patient will demonstrate left knee extension/flexion active range of motion of at least 5-95 degrees to assist with more normalized gait pattern and stair ambulation.    Time  3    Period  Weeks    Status  New    Target Date  06/29/19      PT SHORT TERM GOAL #3   Title  Patient will demonstrate improvement of 1/2 MMT grade in all deficient musculature to assist with proper gait mechanics.     Time  3    Period  Weeks    Status  New    Target Date  06/29/19        PT Long Term Goals - 06/13/19 1405      PT LONG TERM GOAL #1   Title  Patient will perform single limb stance on left/right lower extremity for 3 seconds in order to assist with stair ambulation.    Time  6    Period  Weeks    Status  On-going      PT LONG TERM GOAL #2   Title  Patient will demonstrate improvement of 1 MMT grade in all musculature tested as deficient at evaluation to improve gait mechanics and safety.    Time  6    Period  Weeks    Status  On-going      PT LONG TERM GOAL #3   Title  Patient will improve ROM for left knee extension/flexion to 0-115 degrees to improve squatting, and other functional mobility.    Time  6    Period  Weeks    Status  On-going      PT LONG TERM GOAL #4   Title  Patient will demonstrate ability to perform at least 8 sit to stands on the 30 second chair rise indicating improved balance and functional  mobility.    Time  6    Period  Weeks    Status  On-going      PT LONG TERM GOAL #5   Title  Patient will report ability to ambulate for 10 minutes with least restrictive assistive device with no greater than 3/10 pain.    Time  6    Period  Weeks    Status  On-going      PT LONG TERM GOAL #6   Title  Patient will demonstrate ability to ambulate at a gait velocity of at least 0.8 m/s with LRAD on 2MWT indicating improved ability to ambulate safely household and limited community distances.    Time  6    Period  Weeks    Status  On-going            Plan - 06/21/19 1214    Clinical Impression Statement  Continued with established POC this session. Patient demonstrated improvement with hip flexion this session when lifting leg onto 6'' step this session. Added gastrocnemius stretch, hamstring stretch, and heel/toe raises this session. Patient's AROM improved to 5-76 degrees this session. Patient would benefit from continued skilled physical therapy in order  to continue progressing towards functional goals.    Personal Factors and Comorbidities  Past/Current Experience    Examination-Activity Limitations  Stand;Locomotion Level;Bed Mobility;Bend;Transfers;Stairs;Squat    Examination-Participation Restrictions  Community Activity    Stability/Clinical Decision Making  Stable/Uncomplicated    Rehab Potential  Good    PT Frequency  3x / week   3x/week for 3 weeks and 2x/week for 3 weeks   PT Duration  6 weeks    PT Treatment/Interventions  ADLs/Self Care Home Management;Cryotherapy;Electrical Stimulation;DME Instruction;Gait training;Stair training;Functional mobility training;Therapeutic activities;Moist Heat;Therapeutic exercise;Balance training;Neuromuscular re-education;Patient/family education;Orthotic Fit/Training;Manual techniques;Compression bandaging;Scar mobilization;Passive range of motion;Dry needling;Energy conservation;Taping    PT Next Visit Plan  Consider adding  patellofemoral or tibiofemoral joint mobilizations next session. Focus on ROM & decreasing edema. Continue gait training and quad activation. Then progress to balance strengthening as able once ROM normalized.    PT Home Exercise Plan  Continue hospital exercises, supine heel slides 1-2x/day; 06/16/19: Supine heel slides, quad sets, SAQ 10x 1-2 x/day    Consulted and Agree with Plan of Care  Patient       Patient will benefit from skilled therapeutic intervention in order to improve the following deficits and impairments:  Abnormal gait, Increased fascial restricitons, Pain, Decreased mobility, Decreased activity tolerance, Decreased endurance, Decreased range of motion, Decreased strength, Hypomobility, Decreased balance, Difficulty walking, Increased edema, Impaired flexibility  Visit Diagnosis: Stiffness of left knee, not elsewhere classified  Acute pain of left knee  Muscle weakness (generalized)  Other symptoms and signs involving the musculoskeletal system  Other abnormalities of gait and mobility     Problem List Patient Active Problem List   Diagnosis Date Noted  . Iron deficiency anemia 06/20/2019  . S/P left TKA 06/06/2019  . Status post total left knee replacement 06/06/2019  . Special screening for malignant neoplasms, colon 03/28/2019  . Acute diverticulitis 09/26/2018  . LLQ abdominal pain 09/26/2018  . Acute Sigmoid diverticulitis 09/26/2018  . Derangement of anterior horn of lateral meniscus of left knee   . Derangement of posterior horn of medial meniscus of left knee   . Chest pain 04/25/2016  . Hypokalemia 04/25/2016  . Hyperglycemia 04/25/2016  . Diverticulitis of colon 07/17/2014  . Nausea without vomiting 07/17/2014  . Crohn disease ([place]) 07/17/2014  . Diverticulitis of colon without hemorrhage 08/10/2013  . History of arthroscopy of right knee 07/25/2012  . Difficulty in walking(719.7) 06/29/2012  . Weakness of right leg 06/29/2012  . Posterior tibial  tendonitis 11/11/2011  . PTTD (posterior tibial tendon dysfunction) 11/11/2011  . Knee pain, right 11/11/2011  . Chest pain 07/16/2011  . Primary osteoarthritis of left knee 02/25/2010  . PAIN IN JOINT, MULTIPLE SITES 02/25/2010  . Essential hypertension 08/13/2009  . HIATAL HERNIA 08/13/2009  . PALPITATIONS 08/13/2009  . DYSPNEA 08/13/2009   Clarene Critchley PT, DPT 12:16 PM, 06/21/19 [place], [place], [address] Phone: [phone]   Fax:  [phone]  Name: [name] MRN: [id number] Date of Birth: 03-07-1966

## 2019-06-22 ENCOUNTER — Encounter (HOSPITAL_COMMUNITY): Payer: 59

## 2019-06-23 ENCOUNTER — Encounter (HOSPITAL_COMMUNITY): Payer: Self-pay | Admitting: Physical Therapy

## 2019-06-23 ENCOUNTER — Ambulatory Visit (HOSPITAL_COMMUNITY): Payer: PRIVATE HEALTH INSURANCE | Admitting: Physical Therapy

## 2019-06-23 ENCOUNTER — Other Ambulatory Visit: Payer: Self-pay

## 2019-06-23 DIAGNOSIS — M6281 Muscle weakness (generalized): Secondary | ICD-10-CM

## 2019-06-23 DIAGNOSIS — M25562 Pain in left knee: Secondary | ICD-10-CM

## 2019-06-23 DIAGNOSIS — M25662 Stiffness of left knee, not elsewhere classified: Secondary | ICD-10-CM

## 2019-06-23 NOTE — Patient Instructions (Signed)
Access Code: [email or web address]  URL: [email or web address]  Date: 06/23/2019  Prepared by: Yetta Glassman   Exercises Supine Knee Flexion Wall Slide - 12 reps - 1 sets - 30 hold - 1x daily - 5x weekly Long Sitting Medial Patellar Glide - 15 reps - 1 sets - 5 hold - 1x daily - 7x weekly Long Sitting Inferior Patellar Glide - 15 reps - 1 sets - 5 hold - 1x daily - 7x weekly Long Sitting Lateral Patellar Glide - 15 reps - 1 sets - 5 hold - 1x daily - 7x weekly

## 2019-06-23 NOTE — Therapy (Signed)
[place], [place], [address] Phone: [phone]   Fax:  [phone]  Physical Therapy Treatment  Patient Details  Name: [name] MRN: [id number] Date of Birth: 03/20/1966 Referring Provider (PT): Paralee Cancel, MD   Encounter Date: 06/23/2019  PT End of Session - 06/23/19 1018    Visit Number  6    Number of Visits  16    Date for PT Re-Evaluation  07/20/19   Mini re-assess 06/29/19   Authorization Type  [employer] (1 eval and 16 treatment sessions approved)    Authorization Time Period  06/08/19 - 07/20/19    Authorization - Visit Number  6    Authorization - Number of Visits  17    PT Start Time  0916    PT Stop Time  1002    PT Time Calculation (min)  46 min    Activity Tolerance  Patient tolerated treatment well    Behavior During Therapy  [facility] for tasks assessed/performed       Past Medical History:  Diagnosis Date  . Anxiety   . Arthritis   . Asthma   . Cervical hyperplasia    hisotry of  . Chest pain    cath 2005 norm cors, repeat cath Feb 2011 normal cors and only minimally  elevated pulmonary pressures  . Crohn disease ([place])   . Diverticulosis   . GERD (gastroesophageal reflux disease)   . Hiatal hernia   . History of iron deficiency anemia   . Hypertension   . Migraines   . Obesity   . Palpitations   . Pneumonia 2008   14 days hospitalization, bilateral  . PONV (postoperative nausea and vomiting)   . Sleep apnea sleep study 11/03/2009    cpap; does sometimes, doesnt use every night. weight loss no longer using CPAP    Past Surgical History:  Procedure Laterality Date  . ABDOMINAL HYSTERECTOMY    . ANKLE ARTHROSCOPY  06/01/2012   Procedure: ANKLE ARTHROSCOPY;  Surgeon: Colin Rhein, MD;  Location: [place];  Service: Orthopedics;  Laterality: Left;  left ankle arthorsocpy with extensive debridement and gastroc slide  . ANKLE SURGERY Left 05/11/2018  .  BIOPSY  04/26/2019   Procedure: BIOPSY;  Surgeon: Rogene Houston, MD;  [facility];  Service: Endoscopy;;  ileum colon  . CARDIAC CATHETERIZATION  11/22/2009 and 2005   WNL  . CESAREAN SECTION    . CHOLECYSTECTOMY  09/08/2006   lap. chole.  . CHONDROPLASTY  06/17/2012   Procedure: CHONDROPLASTY;  Surgeon: Carole Civil, MD;  [facility];  Service: Orthopedics;  Laterality: Right;  right patella  . COLONOSCOPY N/A 04/26/2019   Procedure: COLONOSCOPY;  Surgeon: Rogene Houston, MD;  [facility];  Service: Endoscopy;  Laterality: N/A;  100  . ESOPHAGOGASTRODUODENOSCOPY N/A 05/14/2015   Procedure: ESOPHAGOGASTRODUODENOSCOPY (EGD);  Surgeon: Rogene Houston, MD;  [facility];  Service: Endoscopy;  Laterality: N/A;  730  . GIVENS CAPSULE STUDY N/A 05/24/2019   Procedure: GIVENS CAPSULE STUDY;  Surgeon: Rogene Houston, MD;  [facility];  Service: Endoscopy;  Laterality: N/A;  7:30  . INGUINAL HERNIA REPAIR  10/30/2008   right  . KNEE ARTHROSCOPY Right 2013  . KNEE ARTHROSCOPY WITH LATERAL MENISECTOMY Left 12/23/2016   Procedure: LEFT KNEE ARTHROSCOPY WITH LATERAL MENISECTOMY;  Surgeon: Carole Civil, MD;  Location: [facility];  Service: Orthopedics;  Laterality: Left;  . KNEE ARTHROSCOPY WITH MEDIAL MENISECTOMY Left 12/23/2016   Procedure: LEFT KNEE ARTHROSCOPY WITH MEDIAL MENISECTOMY CHONDROPLASTY PATELLA  AND MEDIAL FEMORAL CONDYLE LEFT KNEE;  Surgeon: Carole Civil, MD;  Location: [facility];  Service: Orthopedics;  Laterality: Left;  . SHOULDER ARTHROSCOPY WITH SUBACROMIAL DECOMPRESSION AND BICEP TENDON REPAIR Left 12/21/2017   Procedure: Left shoulder arthroscopic biceps tenodesis, SAD, DCR and labrum debridement;  Surgeon: Nicholes Stairs, MD;  Location: [place];  Service: Orthopedics;  Laterality: Left;  120 mins  . SHOULDER SURGERY     right x 2   . TOTAL KNEE ARTHROPLASTY Left 06/06/2019   Procedure: TOTAL KNEE ARTHROPLASTY;   Surgeon: Paralee Cancel, MD;  Location: [facility];  Service: Orthopedics;  Laterality: Left;  70 mins    There were no vitals filed for this visit.  Subjective Assessment - 06/23/19 1015    Subjective  Patient reports she saw the MD and was a little disappointed as he wanted her to have more motion by now and she felt she was doing very well. States the MD told her that she has 4 weeks to get 0-120 or he may decide to do another surgery to improve her knee ROM. Patient reports she does not want another surgery and will do anything to work on her mobility. Patient reports her stiffness is always worse in the morning, reports pain but doesn't rate it at this time.    Pertinent History  Left TKA 06/06/19, see above for further surgical history    Limitations  Standing;Walking;House hold activities    How long can you sit comfortably?  Not limited    How long can you stand comfortably?  5 minutes or less    How long can you walk comfortably?  5 minutes or less    Patient Stated Goals  Improving walking    Currently in Pain?  Yes    Pain Score  --   no numerical rating provided on this date   Pain Location  Knee    Pain Orientation  Left    Pain Descriptors / Indicators  Aching    Pain Type  Surgical pain    Pain Onset  1 to 4 weeks ago                       [facility] Adult PT Treatment/Exercise - 06/23/19 0001      Knee/Hip Exercises: Seated   Long Arc Quad  AROM;Left;5 sets   5" holds     Knee/Hip Exercises: Supine   Short Arc Quad Sets  AROM;Strengthening;Left;1 set;5 reps   5" hold - two folded towels beneath knee   Heel Slides  AROM;AAROM;Left;5 reps   30 - 60 seconds holds - foot on incline table   Patellar Mobs  self mobilizations with PT tactile cues L- inf/med/lat - 2 minutes    Knee Extension  AAROM    Knee Extension Limitations  4    Knee Flexion  AAROM    Knee Flexion Limitations  90   78 at beginning of session, 90 at end of session   Other Supine Knee/Hip  Exercises  self mobilization to previous portal sites - educated in scar mobilization - 2 minutes      Manual Therapy   Manual Therapy  Edema management;Joint mobilization;Soft tissue mobilization    Manual therapy comments  All manual completed separately from other skilled interventions    Edema Management  supine- edema mobilization  to left lower leg- focused on posterior knee and medial/lateral knee. performed with knee straight and bent    Joint Mobilization  oscillations of left tibia with knee flexion and extension to improve ROM and decrease muscle guarding. Grade II tibial mob Left, AP and PA  at different degrees of knee flexion      Soft tissue mobilization  STM to distal quad, distal hamstring and previous portal sites at Left knee - performed with knee straight and with knee bent (knee bent yeilded improved knee ROM)             PT Education - 06/23/19 1017    Education Details  HEP, swelling (compression and elevation to combat it), typical ROM goals, different positions for knee ROM and why more is availble in different positions    Person(s) Educated  Patient    Methods  Explanation;Demonstration    Comprehension  Verbalized understanding       PT Short Term Goals - 06/13/19 1405      PT SHORT TERM GOAL #1   Title  Patient will demonstrate understanding and report regular compliance with HEP to improve knee AROM, lower extremity strength, and overall functional mobility.    Time  3    Period  Weeks    Status  New    Target Date  06/29/19      PT SHORT TERM GOAL #2   Title  Patient will demonstrate left knee extension/flexion active range of motion of at least 5-95 degrees to assist with more normalized gait pattern and stair ambulation.    Time  3    Period  Weeks    Status  New    Target Date  06/29/19      PT SHORT TERM GOAL #3   Title  Patient will demonstrate improvement of 1/2 MMT grade in all deficient musculature to assist with proper gait mechanics.      Time  3    Period  Weeks    Status  New    Target Date  06/29/19        PT Long Term Goals - 06/13/19 1405      PT LONG TERM GOAL #1   Title  Patient will perform single limb stance on left/right lower extremity for 3 seconds in order to assist with stair ambulation.    Time  6    Period  Weeks    Status  On-going      PT LONG TERM GOAL #2   Title  Patient will demonstrate improvement of 1 MMT grade in all musculature tested as deficient at evaluation to improve gait mechanics and safety.    Time  6    Period  Weeks    Status  On-going      PT LONG TERM GOAL #3   Title  Patient will improve ROM for left knee extension/flexion to 0-115 degrees to improve squatting, and other functional mobility.    Time  6    Period  Weeks    Status  On-going      PT LONG TERM GOAL #4   Title  Patient will demonstrate ability to perform at least 8 sit to stands on the 30 second chair rise indicating improved balance and functional mobility.    Time  6    Period  Weeks    Status  On-going      PT LONG TERM GOAL #5   Title  Patient will report ability to ambulate  for 10 minutes with least restrictive assistive device with no greater than 3/10 pain.    Time  6    Period  Weeks    Status  On-going      PT LONG TERM GOAL #6   Title  Patient will demonstrate ability to ambulate at a gait velocity of at least 0.8 m/s with LRAD on 2MWT indicating improved ability to ambulate safely household and limited community distances.    Time  6    Period  Weeks    Status  On-going            Plan - 06/23/19 1023    Clinical Impression Statement  Focused on knee ROM today as this was patient's primary concern following most recent MD appointment. Educated patient on different positions to bend knee and to mobilize patella and previous scar tissue at portal sites from previous knee surgeries (patient reports pain and "catching" at medial portal site). Performed tibial and patella mobilizations,  along with scar tissue mobilization with and without knee flexion and able to improve knee ROM from 78 degrees at the start of today's session to 90 degrees. Instructed patient to perform strengthening exercises when she got home as we did not focus on these during today's session. Reviewed and performed LAQs and SAQs (SLR too challenging at this time). Will follow up with quad strength and knee ROM next session.    Personal Factors and Comorbidities  Past/Current Experience    Examination-Activity Limitations  Stand;Locomotion Level;Bed Mobility;Bend;Transfers;Stairs;Squat    Examination-Participation Restrictions  Community Activity    Stability/Clinical Decision Making  Stable/Uncomplicated    Rehab Potential  Good    PT Frequency  3x / week   3x/week for 3 weeks and 2x/week for 3 weeks   PT Duration  6 weeks    PT Treatment/Interventions  ADLs/Self Care Home Management;Cryotherapy;Electrical Stimulation;DME Instruction;Gait training;Stair training;Functional mobility training;Therapeutic activities;Moist Heat;Therapeutic exercise;Balance training;Neuromuscular re-education;Patient/family education;Orthotic Fit/Training;Manual techniques;Compression bandaging;Scar mobilization;Passive range of motion;Dry needling;Energy conservation;Taping    PT Next Visit Plan  Consider adding patellofemoral or tibiofemoral joint mobilizations next session. Focus on ROM & decreasing edema. Continue gait training and quad activation. Then progress to balance strengthening as able once ROM normalized.    PT Home Exercise Plan  Continue hospital exercises, supine heel slides 1-2x/day; 06/16/19: Supine heel slides, quad sets, SAQ 10x 1-2 x/day. 9/18- portal site mobilization, patella mobilization, heel slides with foot on wall.    Consulted and Agree with Plan of Care  Patient       Patient will benefit from skilled therapeutic intervention in order to improve the following deficits and impairments:  Abnormal gait,  Increased fascial restricitons, Pain, Decreased mobility, Decreased activity tolerance, Decreased endurance, Decreased range of motion, Decreased strength, Hypomobility, Decreased balance, Difficulty walking, Increased edema, Impaired flexibility  Visit Diagnosis: Stiffness of left knee, not elsewhere classified  Acute pain of left knee  Muscle weakness (generalized)     Problem List Patient Active Problem List   Diagnosis Date Noted  . Iron deficiency anemia 06/20/2019  . S/P left TKA 06/06/2019  . Status post total left knee replacement 06/06/2019  . Special screening for malignant neoplasms, colon 03/28/2019  . Acute diverticulitis 09/26/2018  . LLQ abdominal pain 09/26/2018  . Acute Sigmoid diverticulitis 09/26/2018  . Derangement of anterior horn of lateral meniscus of left knee   . Derangement of posterior horn of medial meniscus of left knee   . Chest pain 04/25/2016  . Hypokalemia 04/25/2016  .  Hyperglycemia 04/25/2016  . Diverticulitis of colon 07/17/2014  . Nausea without vomiting 07/17/2014  . Crohn disease ([place]) 07/17/2014  . Diverticulitis of colon without hemorrhage 08/10/2013  . History of arthroscopy of right knee 07/25/2012  . Difficulty in walking(719.7) 06/29/2012  . Weakness of right leg 06/29/2012  . Posterior tibial tendonitis 11/11/2011  . PTTD (posterior tibial tendon dysfunction) 11/11/2011  . Knee pain, right 11/11/2011  . Chest pain 07/16/2011  . Primary osteoarthritis of left knee 02/25/2010  . PAIN IN JOINT, MULTIPLE SITES 02/25/2010  . Essential hypertension 08/13/2009  . HIATAL HERNIA 08/13/2009  . PALPITATIONS 08/13/2009  . DYSPNEA 08/13/2009   10:28 AM, 06/23/19 Jerene Pitch, DPT Physical Therapy with [facility]  [phone] office   [facility] [address] [place], [place], [address] Phone: [phone]   Fax:  [phone]  Name: DISA RIEDLINGER MRN:  [id number] Date of Birth: 09/26/1966

## 2019-06-26 ENCOUNTER — Other Ambulatory Visit: Payer: Self-pay

## 2019-06-26 ENCOUNTER — Encounter (HOSPITAL_COMMUNITY): Payer: Self-pay | Admitting: Physical Therapy

## 2019-06-26 ENCOUNTER — Ambulatory Visit (HOSPITAL_COMMUNITY): Payer: PRIVATE HEALTH INSURANCE | Admitting: Physical Therapy

## 2019-06-26 DIAGNOSIS — R2689 Other abnormalities of gait and mobility: Secondary | ICD-10-CM

## 2019-06-26 DIAGNOSIS — M6281 Muscle weakness (generalized): Secondary | ICD-10-CM

## 2019-06-26 DIAGNOSIS — M25562 Pain in left knee: Secondary | ICD-10-CM

## 2019-06-26 DIAGNOSIS — M25662 Stiffness of left knee, not elsewhere classified: Secondary | ICD-10-CM

## 2019-06-26 DIAGNOSIS — R29898 Other symptoms and signs involving the musculoskeletal system: Secondary | ICD-10-CM

## 2019-06-26 NOTE — Therapy (Signed)
[facility] [place], [place], [address] Phone: [phone]   Fax:  [phone]  Physical Therapy Treatment  Patient Details  Name: [name] MRN: [id number] Date of Birth: 08/16/1966 Referring Provider (PT): Paralee Cancel, MD   Encounter Date: 06/26/2019  PT End of Session - 06/26/19 1221    Visit Number  7    Number of Visits  16    Date for PT Re-Evaluation  07/20/19   Mini re-assess 06/29/19   Authorization Type  [employer] (1 eval and 16 treatment sessions approved)    Authorization Time Period  06/08/19 - 07/20/19    Authorization - Visit Number  7    Authorization - Number of Visits  17    PT Start Time  3382    PT Stop Time  1228    PT Time Calculation (min)  43 min    Activity Tolerance  Patient tolerated treatment well    Behavior During Therapy  [facility] for tasks assessed/performed       Past Medical History:  Diagnosis Date  . Anxiety   . Arthritis   . Asthma   . Cervical hyperplasia    hisotry of  . Chest pain    cath 2005 norm cors, repeat cath Feb 2011 normal cors and only minimally  elevated pulmonary pressures  . Crohn disease ([place])   . Diverticulosis   . GERD (gastroesophageal reflux disease)   . Hiatal hernia   . History of iron deficiency anemia   . Hypertension   . Migraines   . Obesity   . Palpitations   . Pneumonia 2008   14 days hospitalization, bilateral  . PONV (postoperative nausea and vomiting)   . Sleep apnea sleep study 11/03/2009    cpap; does sometimes, doesnt use every night. weight loss no longer using CPAP    Past Surgical History:  Procedure Laterality Date  . ABDOMINAL HYSTERECTOMY    . ANKLE ARTHROSCOPY  06/01/2012   Procedure: ANKLE ARTHROSCOPY;  Surgeon: Colin Rhein, MD;  Location: [facility];  Service: Orthopedics;  Laterality: Left;  left ankle arthorsocpy with extensive debridement and gastroc slide  . ANKLE SURGERY Left 05/11/2018  .  BIOPSY  04/26/2019   Procedure: BIOPSY;  Surgeon: Rogene Houston, MD;  [facility];  Service: Endoscopy;;  ileum colon  . CARDIAC CATHETERIZATION  11/22/2009 and 2005   WNL  . CESAREAN SECTION    . CHOLECYSTECTOMY  09/08/2006   lap. chole.  . CHONDROPLASTY  06/17/2012   Procedure: CHONDROPLASTY;  Surgeon: Carole Civil, MD;  [facility]: [facility];  Service: Orthopedics;  Laterality: Right;  right patella  . COLONOSCOPY N/A 04/26/2019   Procedure: COLONOSCOPY;  Surgeon: Rogene Houston, MD;  [facility];  Service: Endoscopy;  Laterality: N/A;  100  . ESOPHAGOGASTRODUODENOSCOPY N/A 05/14/2015   Procedure: ESOPHAGOGASTRODUODENOSCOPY (EGD);  Surgeon: Rogene Houston, MD;  [facility];  Service: Endoscopy;  Laterality: N/A;  730  . GIVENS CAPSULE STUDY N/A 05/24/2019   Procedure: GIVENS CAPSULE STUDY;  Surgeon: Rogene Houston, MD;  [facility];  Service: Endoscopy;  Laterality: N/A;  7:30  . INGUINAL HERNIA REPAIR  10/30/2008   right  . KNEE ARTHROSCOPY Right 2013  . KNEE ARTHROSCOPY WITH LATERAL MENISECTOMY Left 12/23/2016   Procedure: LEFT KNEE ARTHROSCOPY WITH LATERAL MENISECTOMY;  Surgeon: Carole Civil, MD;  Location: [facility];  Service: Orthopedics;  Laterality: Left;  . KNEE ARTHROSCOPY WITH MEDIAL MENISECTOMY Left 12/23/2016   Procedure: LEFT KNEE ARTHROSCOPY WITH MEDIAL MENISECTOMY CHONDROPLASTY PATELLA  AND MEDIAL FEMORAL CONDYLE LEFT KNEE;  Surgeon: Carole Civil, MD;  Location: [facility];  Service: Orthopedics;  Laterality: Left;  . SHOULDER ARTHROSCOPY WITH SUBACROMIAL DECOMPRESSION AND BICEP TENDON REPAIR Left 12/21/2017   Procedure: Left shoulder arthroscopic biceps tenodesis, SAD, DCR and labrum debridement;  Surgeon: Nicholes Stairs, MD;  Location: [facility];  Service: Orthopedics;  Laterality: Left;  120 mins  . SHOULDER SURGERY     right x 2   . TOTAL KNEE ARTHROPLASTY Left 06/06/2019   Procedure: TOTAL KNEE ARTHROPLASTY;   Surgeon: Paralee Cancel, MD;  Location: [facility];  Service: Orthopedics;  Laterality: Left;  70 mins    There were no vitals filed for this visit.  Subjective Assessment - 06/26/19 1146    Subjective  PT has been working hard on her ROM    Pertinent History  Left TKA 06/06/19, see above for further surgical history    Limitations  Standing;Walking;House hold activities    How long can you sit comfortably?  Not limited    How long can you stand comfortably?  5 minutes or less    How long can you walk comfortably?  5 minutes or less    Patient Stated Goals  Improving walking    Pain Onset  1 to 4 weeks ago                       [facility] Adult PT Treatment/Exercise - 06/26/19 0001      Knee/Hip Exercises: Stretches   Active Hamstring Stretch  Left;3 reps;30 seconds    Active Hamstring Stretch Limitations  supine      Knee/Hip Exercises: Seated   Long Arc Quad  AROM;Left;5 sets   5" holds at mat using RT leg to push back      Knee/Hip Exercises: Supine   Quad Sets  Left;10 reps    Heel Slides  Left;5 reps    Patellar Mobs  mob     Knee Extension  AROM    Knee Extension Limitations  2    Knee Flexion  AROM    Knee Flexion Limitations  105      Knee/Hip Exercises: Prone   Hamstring Curl  10 reps    Contract/Relax to Increase Flexion  5    Other Prone Exercises  terminal extension x 5       Manual Therapy   Manual Therapy  Edema management;Joint mobilization;Soft tissue mobilization    Manual therapy comments  All manual completed separately from other skilled interventions    Edema Management  supine- edema mobilization to left lower leg- focused on posterior knee and medial/lateral knee. performed with knee straight and bent               PT Short Term Goals - 06/13/19 1405      PT SHORT TERM GOAL #1   Title  Patient will demonstrate understanding and report regular compliance with HEP to improve knee AROM, lower extremity strength, and overall functional  mobility.    Time  3    Period  Weeks    Status  New    Target Date  06/29/19      PT SHORT TERM GOAL #2   Title  Patient will demonstrate left knee extension/flexion active range of motion of at least 5-95 degrees to assist with more  normalized gait pattern and stair ambulation.    Time  3    Period  Weeks    Status  New    Target Date  06/29/19      PT SHORT TERM GOAL #3   Title  Patient will demonstrate improvement of 1/2 MMT grade in all deficient musculature to assist with proper gait mechanics.     Time  3    Period  Weeks    Status  New    Target Date  06/29/19        PT Long Term Goals - 06/13/19 1405      PT LONG TERM GOAL #1   Title  Patient will perform single limb stance on left/right lower extremity for 3 seconds in order to assist with stair ambulation.    Time  6    Period  Weeks    Status  On-going      PT LONG TERM GOAL #2   Title  Patient will demonstrate improvement of 1 MMT grade in all musculature tested as deficient at evaluation to improve gait mechanics and safety.    Time  6    Period  Weeks    Status  On-going      PT LONG TERM GOAL #3   Title  Patient will improve ROM for left knee extension/flexion to 0-115 degrees to improve squatting, and other functional mobility.    Time  6    Period  Weeks    Status  On-going      PT LONG TERM GOAL #4   Title  Patient will demonstrate ability to perform at least 8 sit to stands on the 30 second chair rise indicating improved balance and functional mobility.    Time  6    Period  Weeks    Status  On-going      PT LONG TERM GOAL #5   Title  Patient will report ability to ambulate for 10 minutes with least restrictive assistive device with no greater than 3/10 pain.    Time  6    Period  Weeks    Status  On-going      PT LONG TERM GOAL #6   Title  Patient will demonstrate ability to ambulate at a gait velocity of at least 0.8 m/s with LRAD on 2MWT indicating improved ability to ambulate safely  household and limited community distances.    Time  6    Period  Weeks    Status  On-going            Plan - 06/26/19 1222    Clinical Impression Statement  Pt comes into department barely using walker, therapist recommended pt to go to a cane vs. her walker.  Continued to work on ROM adding prone exercise. Pt has improved greatly reachin 105 degrees on her own at this time.  Therapist recommended 20-30 mmHG thigh high compression stockings to reduce edema.    Personal Factors and Comorbidities  Past/Current Experience    Examination-Activity Limitations  Stand;Locomotion Level;Bed Mobility;Bend;Transfers;Stairs;Squat    Examination-Participation Restrictions  Community Activity    Stability/Clinical Decision Making  Stable/Uncomplicated    Rehab Potential  Good    PT Frequency  3x / week   3x/week for 3 weeks and 2x/week for 3 weeks   PT Duration  6 weeks    PT Treatment/Interventions  ADLs/Self Care Home Management;Cryotherapy;Electrical Stimulation;DME Instruction;Gait training;Stair training;Functional mobility training;Therapeutic activities;Moist Heat;Therapeutic exercise;Balance training;Neuromuscular re-education;Patient/family education;Orthotic Fit/Training;Manual techniques;Compression bandaging;Scar mobilization;Passive range  of motion;Dry needling;Energy conservation;Taping    PT Next Visit Plan  Consider adding patellofemoral or tibiofemoral joint mobilizations next session. Focus on ROM & decreasing edema. Continue gait training and quad activation. Then progress to balance strengthening as able once ROM normalized.    PT Home Exercise Plan  Continue hospital exercises, supine heel slides 1-2x/day; 06/16/19: Supine heel slides, quad sets, SAQ 10x 1-2 x/day. 9/18- portal site mobilization, patella mobilization, heel slides with foot on wall.    Consulted and Agree with Plan of Care  Patient       Patient will benefit from skilled therapeutic intervention in order to improve  the following deficits and impairments:  Abnormal gait, Increased fascial restricitons, Pain, Decreased mobility, Decreased activity tolerance, Decreased endurance, Decreased range of motion, Decreased strength, Hypomobility, Decreased balance, Difficulty walking, Increased edema, Impaired flexibility  Visit Diagnosis: Stiffness of left knee, not elsewhere classified  Acute pain of left knee  Muscle weakness (generalized)  Other symptoms and signs involving the musculoskeletal system  Other abnormalities of gait and mobility     Problem List Patient Active Problem List   Diagnosis Date Noted  . Iron deficiency anemia 06/20/2019  . S/P left TKA 06/06/2019  . Status post total left knee replacement 06/06/2019  . Special screening for malignant neoplasms, colon 03/28/2019  . Acute diverticulitis 09/26/2018  . LLQ abdominal pain 09/26/2018  . Acute Sigmoid diverticulitis 09/26/2018  . Derangement of anterior horn of lateral meniscus of left knee   . Derangement of posterior horn of medial meniscus of left knee   . Chest pain 04/25/2016  . Hypokalemia 04/25/2016  . Hyperglycemia 04/25/2016  . Diverticulitis of colon 07/17/2014  . Nausea without vomiting 07/17/2014  . Crohn disease ([place]) 07/17/2014  . Diverticulitis of colon without hemorrhage 08/10/2013  . History of arthroscopy of right knee 07/25/2012  . Difficulty in walking(719.7) 06/29/2012  . Weakness of right leg 06/29/2012  . Posterior tibial tendonitis 11/11/2011  . PTTD (posterior tibial tendon dysfunction) 11/11/2011  . Knee pain, right 11/11/2011  . Chest pain 07/16/2011  . Primary osteoarthritis of left knee 02/25/2010  . PAIN IN JOINT, MULTIPLE SITES 02/25/2010  . Essential hypertension 08/13/2009  . HIATAL HERNIA 08/13/2009  . PALPITATIONS 08/13/2009  . DYSPNEA 08/13/2009  Rayetta Humphrey, PT CLT [phone] 06/26/2019, 12:32 PM  [place], [place], [address] Phone: [phone]   Fax:  [phone]  Name: [name] MRN: [id number] Date of Birth: 03-18-1966

## 2019-06-27 ENCOUNTER — Encounter (HOSPITAL_COMMUNITY): Payer: 59 | Admitting: Physical Therapy

## 2019-06-28 ENCOUNTER — Other Ambulatory Visit: Payer: Self-pay

## 2019-06-28 ENCOUNTER — Encounter (HOSPITAL_COMMUNITY): Payer: Self-pay | Admitting: Physical Therapy

## 2019-06-28 ENCOUNTER — Ambulatory Visit (HOSPITAL_COMMUNITY): Payer: PRIVATE HEALTH INSURANCE | Admitting: Physical Therapy

## 2019-06-28 DIAGNOSIS — M25662 Stiffness of left knee, not elsewhere classified: Secondary | ICD-10-CM | POA: Diagnosis not present

## 2019-06-28 DIAGNOSIS — M25562 Pain in left knee: Secondary | ICD-10-CM

## 2019-06-28 DIAGNOSIS — R2689 Other abnormalities of gait and mobility: Secondary | ICD-10-CM

## 2019-06-28 DIAGNOSIS — M6281 Muscle weakness (generalized): Secondary | ICD-10-CM

## 2019-06-28 DIAGNOSIS — R29898 Other symptoms and signs involving the musculoskeletal system: Secondary | ICD-10-CM

## 2019-06-28 NOTE — Therapy (Signed)
[facility] [address] [place], [place], [address] Phone: [phone]   Fax:  [phone]  Physical Therapy Treatment / Progress Note  Patient Details  Name: [name] MRN: [id number] Date of Birth: [birth year] Referring Provider (PT): Paralee Cancel, MD   Encounter Date: 06/28/2019   Progress Note Reporting Period 06/08/19 to 06/28/19  See note below for Objective Data and Assessment of Progress/Goals.       PT End of Session - 06/28/19 1455    Visit Number  8    Number of Visits  16    Date for PT Re-Evaluation  07/20/19   Mini re-assess performed 06/28/19   Authorization Type  [employer] (1 eval and 16 treatment sessions approved)    Authorization Time Period  06/08/19 - 07/20/19    Authorization - Visit Number  8    Authorization - Number of Visits  17    PT Start Time  1301    PT Stop Time  1345    PT Time Calculation (min)  44 min    Equipment Utilized During Treatment  Gait belt    Activity Tolerance  Patient tolerated treatment well    Behavior During Therapy  WFL for tasks assessed/performed       Past Medical History:  Diagnosis Date  . Anxiety   . Arthritis   . Asthma   . Cervical hyperplasia    hisotry of  . Chest pain    cath 2005 norm cors, repeat cath Feb 2011 normal cors and only minimally  elevated pulmonary pressures  . Crohn disease ([place])   . Diverticulosis   . GERD (gastroesophageal reflux disease)   . Hiatal hernia   . History of iron deficiency anemia   . Hypertension   . Migraines   . Obesity   . Palpitations   . Pneumonia 2008   14 days hospitalization, bilateral  . PONV (postoperative nausea and vomiting)   . Sleep apnea sleep study 11/03/2009    cpap; does sometimes, doesnt use every night. weight loss no longer using CPAP    Past Surgical History:  Procedure Laterality Date  . ABDOMINAL HYSTERECTOMY    . ANKLE ARTHROSCOPY  06/01/2012   Procedure: ANKLE ARTHROSCOPY;  Surgeon:  Colin Rhein, MD;  Location: [facility];  Service: Orthopedics;  Laterality: Left;  left ankle arthorsocpy with extensive debridement and gastroc slide  . ANKLE SURGERY Left 05/11/2018  . BIOPSY  04/26/2019   Procedure: BIOPSY;  Surgeon: Rogene Houston, MD;  Location[facility];  Service: Endoscopy;;  ileum colon  . CARDIAC CATHETERIZATION  11/22/2009 and 2005   WNL  . CESAREAN SECTION    . CHOLECYSTECTOMY  09/08/2006   lap. chole.  . CHONDROPLASTY  06/17/2012   Procedure: CHONDROPLASTY;  Surgeon: Carole Civil, MD;  Location: [facility];  Service: Orthopedics;  Laterality: Right;  right patella  . COLONOSCOPY N/A 04/26/2019   Procedure: COLONOSCOPY;  Surgeon: Rogene Houston, MD;  [facility];  Service: Endoscopy;  Laterality: N/A;  100  . ESOPHAGOGASTRODUODENOSCOPY N/A 05/14/2015   Procedure: ESOPHAGOGASTRODUODENOSCOPY (EGD);  Surgeon: Rogene Houston, MD;  [facility];  Service: Endoscopy;  Laterality: N/A;  730  . GIVENS CAPSULE STUDY N/A 05/24/2019   Procedure: GIVENS CAPSULE STUDY;  Surgeon: Rogene Houston, MD;  [facility];  Service: Endoscopy;  Laterality: N/A;  7:30  . INGUINAL HERNIA REPAIR  10/30/2008  right  . KNEE ARTHROSCOPY Right 2013  . KNEE ARTHROSCOPY WITH LATERAL MENISECTOMY Left 12/23/2016   Procedure: LEFT KNEE ARTHROSCOPY WITH LATERAL MENISECTOMY;  Surgeon: Carole Civil, MD;  Location: [facility];  Service: Orthopedics;  Laterality: Left;  . KNEE ARTHROSCOPY WITH MEDIAL MENISECTOMY Left 12/23/2016   Procedure: LEFT KNEE ARTHROSCOPY WITH MEDIAL MENISECTOMY CHONDROPLASTY PATELLA  AND MEDIAL FEMORAL CONDYLE LEFT KNEE;  Surgeon: Carole Civil, MD;  Location: [facility];  Service: Orthopedics;  Laterality: Left;  . SHOULDER ARTHROSCOPY WITH SUBACROMIAL DECOMPRESSION AND BICEP TENDON REPAIR Left 12/21/2017   Procedure: Left shoulder arthroscopic biceps tenodesis, SAD, DCR and labrum debridement;  Surgeon: Nicholes Stairs, MD;  Location: [facility];  Service: Orthopedics;  Laterality: Left;  120 mins  . SHOULDER SURGERY     right x 2   . TOTAL KNEE ARTHROPLASTY Left 06/06/2019   Procedure: TOTAL KNEE ARTHROPLASTY;  Surgeon: Paralee Cancel, MD;  Location: [facility];  Service: Orthopedics;  Laterality: Left;  70 mins    There were no vitals filed for this visit.  Subjective Assessment - 06/28/19 1304    Subjective  Reported 4/10 ahcing feeling in knee reported she has been walking with a cane since Saturday.    Pertinent History  Left TKA 06/06/19, see above for further surgical history    Limitations  Standing;Walking;House hold activities    How long can you sit comfortably?  Not limited    How long can you stand comfortably?  5 minutes or less    How long can you walk comfortably?  5 minutes or less    Patient Stated Goals  Improving walking    Currently in Pain?  Yes    Pain Score  4     Pain Location  Knee    Pain Orientation  Left    Pain Descriptors / Indicators  Aching    Pain Type  Surgical pain    Pain Onset  1 to 4 weeks ago    Pain Frequency  Constant    Aggravating Factors   Standing/walking    Pain Relieving Factors  Ice         [facility] PT Assessment - 06/28/19 0001      Assessment   Medical Diagnosis  S/P Left TKA    Referring Provider (PT)  Paralee Cancel, MD    Onset Date/Surgical Date  06/06/19      Observation/Other Assessments   Focus on Therapeutic Outcomes (FOTO)   69% limited      Circumferential Edema   Circumferential - Right  43 cm at joint line    Circumferential - Left   44.5 cm at joint line      AROM   Left Knee Extension  1    Left Knee Flexion  105      Palpation   Palpation comment  Tender medial to left patella      Transfers   Five time sit to stand comments   30 second chair rise: 4 times with UE intermittently and momentum forward trunk lean strategy      Ambulation/Gait   Ambulation Distance (Feet)  276 Feet   2MWT   Assistive device  Straight cane     Gait Pattern  Decreased stance time - left;Decreased step length - right;Antalgic    Ambulation Surface  Level;Indoor    Gait velocity  0.7 m/s      Static Standing Balance   Static Standing - Balance Support  No upper  extremity supported    Static Standing Balance -  Activities   Single Leg Stance - Right Leg;Single Leg Stance - Left Leg    Static Standing - Comment/# of Minutes  6'' right, 2'' left                   [facility] Adult PT Treatment/Exercise - 06/28/19 0001      Knee/Hip Exercises: Stretches   Sports administrator Limitations  Prone with rope 3x30 seconds    Other Knee/Hip Stretches  knee flexion drive on 8" step, B28 reps with 10 sec hold. Manual PA Tibial femoral joint mobilizations to improve knee flexion 10 oscillations with each repetition      Knee/Hip Exercises: Supine   Short Arc Quad Sets  AROM;Strengthening;Left;1 set;15 reps    Heel Slides  Left;10 reps    Knee Extension  AROM    Knee Extension Limitations  1    Knee Flexion  AROM    Knee Flexion Limitations  105      Knee/Hip Exercises: Prone   Hamstring Curl  10 reps      Manual Therapy   Manual Therapy  Edema management;Joint mobilization;Soft tissue mobilization    Manual therapy comments  All manual completed separately from other skilled interventions    Edema Management  supine bilateral LE elevated- edema mobilization to left lower leg- focused on posterior knee and medial/lateral knee. performed with knee straight and bent    Joint Mobilization  Parellofemoral glides all directions to improve mobility             PT Education - 06/28/19 1454    Education Details  Updated HEP as well as education to continue methods for decreasing edema.    Person(s) Educated  Patient    Methods  Explanation;Handout    Comprehension  Verbalized understanding       PT Short Term Goals - 06/28/19 1500      PT SHORT TERM GOAL #1   Title  Patient will demonstrate understanding and report regular  compliance with HEP to improve knee AROM, lower extremity strength, and overall functional mobility.    Time  3    Period  Weeks    Status  Achieved    Target Date  06/29/19      PT SHORT TERM GOAL #2   Title  Patient will demonstrate left knee extension/flexion active range of motion of at least 5-95 degrees to assist with more normalized gait pattern and stair ambulation.    Time  3    Period  Weeks    Status  Achieved    Target Date  06/29/19      PT SHORT TERM GOAL #3   Title  Patient will demonstrate improvement of 1/2 MMT grade in all deficient musculature to assist with proper gait mechanics.     Baseline  06/28/19: Strength still deficient with functional strength assessment    Time  3    Period  Weeks    Status  On-going    Target Date  06/29/19        PT Long Term Goals - 06/28/19 1501      PT LONG TERM GOAL #1   Title  Patient will perform single limb stance on left/right lower extremity for 3 seconds in order to assist with stair ambulation.    Time  6    Period  Weeks    Status  On-going      PT LONG TERM GOAL #  2   Title  Patient will demonstrate improvement of 1 MMT grade in all musculature tested as deficient at evaluation to improve gait mechanics and safety.    Baseline  06/28/19: Strength still deficient with functional strength assessment    Time  6    Period  Weeks    Status  On-going      PT LONG TERM GOAL #3   Title  Patient will improve ROM for left knee extension/flexion to 0-115 degrees to improve squatting, and other functional mobility.    Baseline  06/28/19: Left knee AROM 1-105 degrees    Time  6    Period  Weeks    Status  Partially Met      PT LONG TERM GOAL #4   Title  Patient will demonstrate ability to perform at least 8 sit to stands on the 30 second chair rise indicating improved balance and functional mobility.    Time  6    Period  Weeks    Status  On-going      PT LONG TERM GOAL #5   Title  Patient will report ability to  ambulate for 10 minutes with least restrictive assistive device with no greater than 3/10 pain.    Time  6    Period  Weeks    Status  On-going      PT LONG TERM GOAL #6   Title  Patient will demonstrate ability to ambulate at a gait velocity of at least 0.8 m/s with LRAD on 2MWT indicating improved ability to ambulate safely household and limited community distances.    Time  6    Period  Weeks    Status  On-going            Plan - 06/28/19 1509    Clinical Impression Statement  This session performed a re-assessment of patient's progress towards goals. Patient achieved 2 out of 3 short term goals. Patient partially met one long term goal and is ongoing with all others. Patient has demonstrated improvement in AROM as well as gait velocity, functional strength and balance. Patient continues to be most limited in knee flexion ROM as well as functional strength particularly with transitions such as sit to stands as patient continued to demonstrate compensations with sit to stands on chair rise test this date. This session added prone quadriceps stretch with a strap and added this to patient's HEP. Addressed all of patient's questions and encouraged her to continue working on edema reduction techniques at home. Plan to continue to address knee flexion ROM primarily and begin to progress strengthening and balance activities gradually at this time.    Personal Factors and Comorbidities  Past/Current Experience    Examination-Activity Limitations  Stand;Locomotion Level;Bed Mobility;Bend;Transfers;Stairs;Squat    Examination-Participation Restrictions  Community Activity    Stability/Clinical Decision Making  Stable/Uncomplicated    Rehab Potential  Good    PT Frequency  3x / week   3x/week for 3 weeks and 2x/week for 3 weeks   PT Duration  6 weeks    PT Treatment/Interventions  ADLs/Self Care Home Management;Cryotherapy;Electrical Stimulation;DME Instruction;Gait training;Stair  training;Functional mobility training;Therapeutic activities;Moist Heat;Therapeutic exercise;Balance training;Neuromuscular re-education;Patient/family education;Orthotic Fit/Training;Manual techniques;Compression bandaging;Scar mobilization;Passive range of motion;Dry needling;Energy conservation;Taping    PT Next Visit Plan  Focus more on left knee flexion AROM gradually progress strengthening and balance as able. Consider adding 4'' step-ups as able.    PT Home Exercise Plan  Continue hospital exercises, supine heel slides 1-2x/day; 06/16/19: Supine heel slides,  quad sets, SAQ 10x 1-2 x/day. 9/18- portal site mobilization, patella mobilization, heel slides with foot on wall. 06/28/19: Prone quadriceps stretch 3x30'' daily    Consulted and Agree with Plan of Care  Patient       Patient will benefit from skilled therapeutic intervention in order to improve the following deficits and impairments:  Abnormal gait, Increased fascial restricitons, Pain, Decreased mobility, Decreased activity tolerance, Decreased endurance, Decreased range of motion, Decreased strength, Hypomobility, Decreased balance, Difficulty walking, Increased edema, Impaired flexibility  Visit Diagnosis: Stiffness of left knee, not elsewhere classified  Acute pain of left knee  Muscle weakness (generalized)  Other symptoms and signs involving the musculoskeletal system  Other abnormalities of gait and mobility     Problem List Patient Active Problem List   Diagnosis Date Noted  . Iron deficiency anemia 06/20/2019  . S/P left TKA 06/06/2019  . Status post total left knee replacement 06/06/2019  . Special screening for malignant neoplasms, colon 03/28/2019  . Acute diverticulitis 09/26/2018  . LLQ abdominal pain 09/26/2018  . Acute Sigmoid diverticulitis 09/26/2018  . Derangement of anterior horn of lateral meniscus of left knee   . Derangement of posterior horn of medial meniscus of left knee   . Chest pain  04/25/2016  . Hypokalemia 04/25/2016  . Hyperglycemia 04/25/2016  . Diverticulitis of colon 07/17/2014  . Nausea without vomiting 07/17/2014  . Crohn disease ([place]) 07/17/2014  . Diverticulitis of colon without hemorrhage 08/10/2013  . History of arthroscopy of right knee 07/25/2012  . Difficulty in walking(719.7) 06/29/2012  . Weakness of right leg 06/29/2012  . Posterior tibial tendonitis 11/11/2011  . PTTD (posterior tibial tendon dysfunction) 11/11/2011  . Knee pain, right 11/11/2011  . Chest pain 07/16/2011  . Primary osteoarthritis of left knee 02/25/2010  . PAIN IN JOINT, MULTIPLE SITES 02/25/2010  . Essential hypertension 08/13/2009  . HIATAL HERNIA 08/13/2009  . PALPITATIONS 08/13/2009  . DYSPNEA 08/13/2009   Clarene Critchley PT, DPT 3:15 PM, 06/28/19 [name], [place], [address] Phone: [phone]   Fax:  [phone]  Name: FLORIS NEUHAUS MRN: [id number] Date of Birth: 1965-12-26

## 2019-06-28 NOTE — Patient Instructions (Signed)
[place] access code: [email or web address]

## 2019-06-29 ENCOUNTER — Encounter (HOSPITAL_COMMUNITY): Payer: 59 | Admitting: Physical Therapy

## 2019-06-30 ENCOUNTER — Encounter (HOSPITAL_COMMUNITY): Payer: Self-pay | Admitting: Physical Therapy

## 2019-06-30 ENCOUNTER — Other Ambulatory Visit: Payer: Self-pay

## 2019-06-30 ENCOUNTER — Ambulatory Visit (HOSPITAL_COMMUNITY): Payer: PRIVATE HEALTH INSURANCE | Admitting: Physical Therapy

## 2019-06-30 DIAGNOSIS — M25562 Pain in left knee: Secondary | ICD-10-CM

## 2019-06-30 DIAGNOSIS — M25662 Stiffness of left knee, not elsewhere classified: Secondary | ICD-10-CM | POA: Diagnosis not present

## 2019-06-30 DIAGNOSIS — M6281 Muscle weakness (generalized): Secondary | ICD-10-CM

## 2019-06-30 NOTE — Therapy (Signed)
[place], [place], [address] Phone: [phone]   Fax:  [phone]  Physical Therapy Treatment  Patient Details  Name: [name] MRN: [id number] Date of Birth: 05/10/1966 Referring Provider (PT): Paralee Cancel, MD   Encounter Date: 06/30/2019  PT End of Session - 06/30/19 1027    Visit Number  9    Number of Visits  16    Date for PT Re-Evaluation  07/20/19   Mini re-assess performed 06/28/19   Authorization Type  [employer] (1 eval and 16 treatment sessions approved)    Authorization Time Period  06/08/19 - 07/20/19    Authorization - Visit Number  9    Authorization - Number of Visits  17    PT Start Time  0921    PT Stop Time  1002    PT Time Calculation (min)  41 min    Equipment Utilized During Treatment  Gait belt    Activity Tolerance  Patient tolerated treatment well    Behavior During Therapy  WFL for tasks assessed/performed       Past Medical History:  Diagnosis Date  . Anxiety   . Arthritis   . Asthma   . Cervical hyperplasia    hisotry of  . Chest pain    cath 2005 norm cors, repeat cath Feb 2011 normal cors and only minimally  elevated pulmonary pressures  . Crohn disease ([place])   . Diverticulosis   . GERD (gastroesophageal reflux disease)   . Hiatal hernia   . History of iron deficiency anemia   . Hypertension   . Migraines   . Obesity   . Palpitations   . Pneumonia 2008   14 days hospitalization, bilateral  . PONV (postoperative nausea and vomiting)   . Sleep apnea sleep study 11/03/2009    cpap; does sometimes, doesnt use every night. weight loss no longer using CPAP    Past Surgical History:  Procedure Laterality Date  . ABDOMINAL HYSTERECTOMY    . ANKLE ARTHROSCOPY  06/01/2012   Procedure: ANKLE ARTHROSCOPY;  Surgeon: Colin Rhein, MD;  Location: [facility];  Service: Orthopedics;  Laterality: Left;  left ankle arthorsocpy with extensive debridement  and gastroc slide  . ANKLE SURGERY Left 05/11/2018  . BIOPSY  04/26/2019   Procedure: BIOPSY;  Surgeon: Rogene Houston, MD;  [facility];  Service: Endoscopy;;  ileum colon  . CARDIAC CATHETERIZATION  11/22/2009 and 2005   WNL  . CESAREAN SECTION    . CHOLECYSTECTOMY  09/08/2006   lap. chole.  . CHONDROPLASTY  06/17/2012   Procedure: CHONDROPLASTY;  Surgeon: Carole Civil, MD;  Location: [facility];  Service: Orthopedics;  Laterality: Right;  right patella  . COLONOSCOPY N/A 04/26/2019   Procedure: COLONOSCOPY;  Surgeon: Rogene Houston, MD;  [facility];  Service: Endoscopy;  Laterality: N/A;  100  . ESOPHAGOGASTRODUODENOSCOPY N/A 05/14/2015   Procedure: ESOPHAGOGASTRODUODENOSCOPY (EGD);  Surgeon: Rogene Houston, MD;  [facility];  Service: Endoscopy;  Laterality: N/A;  730  . GIVENS CAPSULE STUDY N/A 05/24/2019   Procedure: GIVENS CAPSULE STUDY;  Surgeon: Rogene Houston, MD;  [facility];  Service: Endoscopy;  Laterality: N/A;  7:30  . INGUINAL HERNIA REPAIR  10/30/2008   right  . KNEE ARTHROSCOPY Right 2013  . KNEE ARTHROSCOPY WITH LATERAL MENISECTOMY Left 12/23/2016   Procedure: LEFT KNEE ARTHROSCOPY WITH LATERAL MENISECTOMY;  Surgeon: Dorothyann Peng  Vela Prose, MD;  Location: [facility];  Service: Orthopedics;  Laterality: Left;  . KNEE ARTHROSCOPY WITH MEDIAL MENISECTOMY Left 12/23/2016   Procedure: LEFT KNEE ARTHROSCOPY WITH MEDIAL MENISECTOMY CHONDROPLASTY PATELLA  AND MEDIAL FEMORAL CONDYLE LEFT KNEE;  Surgeon: Carole Civil, MD;  Location: [facility];  Service: Orthopedics;  Laterality: Left;  . SHOULDER ARTHROSCOPY WITH SUBACROMIAL DECOMPRESSION AND BICEP TENDON REPAIR Left 12/21/2017   Procedure: Left shoulder arthroscopic biceps tenodesis, SAD, DCR and labrum debridement;  Surgeon: Nicholes Stairs, MD;  Location: [facility];  Service: Orthopedics;  Laterality: Left;  120 mins  . SHOULDER SURGERY     right x 2   . TOTAL KNEE ARTHROPLASTY Left  06/06/2019   Procedure: TOTAL KNEE ARTHROPLASTY;  Surgeon: Paralee Cancel, MD;  Location: [facility];  Service: Orthopedics;  Laterality: Left;  70 mins    There were no vitals filed for this visit.  Subjective Assessment - 06/30/19 0944    Subjective  Patient reports she had a very rough night. States that her whole leg was aching and really bothering her. Reports that she barely slept. States that she is still aching all up and down her leg in her muscles. Reports that she still has been pushing through the exercises.    Pertinent History  Left TKA 06/06/19, see above for further surgical history    Limitations  Standing;Walking;House hold activities    How long can you sit comfortably?  Not limited    How long can you stand comfortably?  5 minutes or less    How long can you walk comfortably?  5 minutes or less    Patient Stated Goals  Improving walking    Currently in Pain?  Yes    Pain Score  5     Pain Location  Leg    Pain Orientation  Left    Pain Descriptors / Indicators  Aching    Pain Type  Surgical pain    Pain Radiating Towards  radiating up and down leg    Pain Onset  1 to 4 weeks ago                       [facility] Adult PT Treatment/Exercise - 06/30/19 0001      Knee/Hip Exercises: Stretches   Other Knee/Hip Stretches  knee flexion drive on 8" step, Q91 reps with 10 sec hold.     Other Knee/Hip Stretches  supine butterfly stretch x10, 10" holds      Knee/Hip Exercises: Standing   Other Standing Knee Exercises  foot taps on 4" step - focus on knee drive and not leaning in trunk 3x5, L - Pt assist --> to wall for posterior support x5 L      Knee/Hip Exercises: Supine   Knee Extension  AROM    Knee Extension Limitations  4    Knee Flexion  AROM    Knee Flexion Limitations  106    Other Supine Knee/Hip Exercises  self mobilization to gluts with lacrosse ball - less pressure       Manual Therapy   Manual Therapy  Joint mobilization;Soft tissue mobilization;Edema  management    Manual therapy comments  All manual completed separately from other skilled interventions    Edema Management  supine L knee edema mobilization- posterior and anterior     Joint Mobilization  oscillations of left tibia with knee flexion and extension to improve ROM and decrease muscle guarding. Grade II tibial mob  Left, AP and PA  at different degrees of knee flexion      Soft tissue mobilization  STM to poster L knee, adductors, distal quad. IASTM to left quads/hamstrings add/calf/ant tib with massage stick                PT Short Term Goals - 06/28/19 1500      PT SHORT TERM GOAL #1   Title  Patient will demonstrate understanding and report regular compliance with HEP to improve knee AROM, lower extremity strength, and overall functional mobility.    Time  3    Period  Weeks    Status  Achieved    Target Date  06/29/19      PT SHORT TERM GOAL #2   Title  Patient will demonstrate left knee extension/flexion active range of motion of at least 5-95 degrees to assist with more normalized gait pattern and stair ambulation.    Time  3    Period  Weeks    Status  Achieved    Target Date  06/29/19      PT SHORT TERM GOAL #3   Title  Patient will demonstrate improvement of 1/2 MMT grade in all deficient musculature to assist with proper gait mechanics.     Baseline  06/28/19: Strength still deficient with functional strength assessment    Time  3    Period  Weeks    Status  On-going    Target Date  06/29/19        PT Long Term Goals - 06/28/19 1501      PT LONG TERM GOAL #1   Title  Patient will perform single limb stance on left/right lower extremity for 3 seconds in order to assist with stair ambulation.    Time  6    Period  Weeks    Status  On-going      PT LONG TERM GOAL #2   Title  Patient will demonstrate improvement of 1 MMT grade in all musculature tested as deficient at evaluation to improve gait mechanics and safety.    Baseline  06/28/19: Strength  still deficient with functional strength assessment    Time  6    Period  Weeks    Status  On-going      PT LONG TERM GOAL #3   Title  Patient will improve ROM for left knee extension/flexion to 0-115 degrees to improve squatting, and other functional mobility.    Baseline  06/28/19: Left knee AROM 1-105 degrees    Time  6    Period  Weeks    Status  Partially Met      PT LONG TERM GOAL #4   Title  Patient will demonstrate ability to perform at least 8 sit to stands on the 30 second chair rise indicating improved balance and functional mobility.    Time  6    Period  Weeks    Status  On-going      PT LONG TERM GOAL #5   Title  Patient will report ability to ambulate for 10 minutes with least restrictive assistive device with no greater than 3/10 pain.    Time  6    Period  Weeks    Status  On-going      PT LONG TERM GOAL #6   Title  Patient will demonstrate ability to ambulate at a gait velocity of at least 0.8 m/s with LRAD on 2MWT indicating improved ability to ambulate safely household and limited  community distances.    Time  6    Period  Weeks    Status  On-going            Plan - 06/30/19 1028    Clinical Impression Statement  During session patient reported her hip has been bothering her since surgery and PT observed that patient has tendency to adduct her left hip with active hip flexion. Added butterfly stretch to HEP and performed self mobilization with lacrosse ball to hip ER/abd. This was poorly tolerated and instructed patient to use less pressure so she can actually relax her muscles with the exercise. ROM measured at 4-106 today with diffuse swelling surrounding left knee. Will continue to work on knee ROM but add in hip stretches and strengthening to decrease hip pain and improve ability to ambulate and step up. Patient would continue to benefit from skilled physical therapy to improve post-operative outcomes and function.    Personal Factors and Comorbidities   Past/Current Experience    Examination-Activity Limitations  Stand;Locomotion Level;Bed Mobility;Bend;Transfers;Stairs;Squat    Examination-Participation Restrictions  Community Activity    Stability/Clinical Decision Making  Stable/Uncomplicated    Rehab Potential  Good    PT Frequency  3x / week   3x/week for 3 weeks and 2x/week for 3 weeks   PT Duration  6 weeks    PT Treatment/Interventions  ADLs/Self Care Home Management;Cryotherapy;Electrical Stimulation;DME Instruction;Gait training;Stair training;Functional mobility training;Therapeutic activities;Moist Heat;Therapeutic exercise;Balance training;Neuromuscular re-education;Patient/family education;Orthotic Fit/Training;Manual techniques;Compression bandaging;Scar mobilization;Passive range of motion;Dry needling;Energy conservation;Taping    PT Next Visit Plan  Focus more on left knee flexion AROM gradually progress strengthening and balance as able. Consider adding 4'' step-ups as able. hip adductor (lengthening/strengthening)    PT Home Exercise Plan  Supine heel slides, quad sets, SAQ 10x 1-2 x/day. portal site mobilization, patella mobilization, heel slides with foot on wall Prone quadriceps stretch 3x30'' dail, self massage, butterfly stretch    Consulted and Agree with Plan of Care  Patient       Patient will benefit from skilled therapeutic intervention in order to improve the following deficits and impairments:  Abnormal gait, Increased fascial restricitons, Pain, Decreased mobility, Decreased activity tolerance, Decreased endurance, Decreased range of motion, Decreased strength, Hypomobility, Decreased balance, Difficulty walking, Increased edema, Impaired flexibility  Visit Diagnosis: Stiffness of left knee, not elsewhere classified  Acute pain of left knee  Muscle weakness (generalized)     Problem List Patient Active Problem List   Diagnosis Date Noted  . Iron deficiency anemia 06/20/2019  . S/P left TKA 06/06/2019   . Status post total left knee replacement 06/06/2019  . Special screening for malignant neoplasms, colon 03/28/2019  . Acute diverticulitis 09/26/2018  . LLQ abdominal pain 09/26/2018  . Acute Sigmoid diverticulitis 09/26/2018  . Derangement of anterior horn of lateral meniscus of left knee   . Derangement of posterior horn of medial meniscus of left knee   . Chest pain 04/25/2016  . Hypokalemia 04/25/2016  . Hyperglycemia 04/25/2016  . Diverticulitis of colon 07/17/2014  . Nausea without vomiting 07/17/2014  . Crohn disease ([place]) 07/17/2014  . Diverticulitis of colon without hemorrhage 08/10/2013  . History of arthroscopy of right knee 07/25/2012  . Difficulty in walking(719.7) 06/29/2012  . Weakness of right leg 06/29/2012  . Posterior tibial tendonitis 11/11/2011  . PTTD (posterior tibial tendon dysfunction) 11/11/2011  . Knee pain, right 11/11/2011  . Chest pain 07/16/2011  . Primary osteoarthritis of left knee 02/25/2010  . PAIN IN JOINT, MULTIPLE SITES  02/25/2010  . Essential hypertension 08/13/2009  . HIATAL HERNIA 08/13/2009  . PALPITATIONS 08/13/2009  . DYSPNEA 08/13/2009    10:40 AM, 06/30/19 Jerene Pitch, DPT Physical Therapy with [facility]  [phone] office   [place] [address] [place], [place], [address] Phone: [phone]   Fax:  [phone]  Name: [name] MRN: [id number] Date of Birth: [birth year]

## 2019-07-02 NOTE — Progress Notes (Deleted)
   Subjective:    Patient ID: [name], female    DOB: 24-Dec-1965, 53 y.o.   MRN: [id number]  HPI  Patient with a past medical history of ileal Crohn's disease initially diagnosed in 2011.  She is status post left knee total replacement surgery 06/06/2019.  She was found to have postoperative anemia. Diverticulitis 2019?   EGD 05/14/2015:  Stomach, biopsy:  Reactive gastropathy antral mucosa, negative for H. Pylori. Duodenal biopsies were not done.    Colonoscopy was 04/26/2019 identified a few erosions in the terminal ileum, 2 erosions in the sigmoid, diverticulosis to the sigmoid, hepatic flexure and a sending colon with external hemorrhoids.   Small bowel capsule  05/24/2019:  Study duration 8 hours 23 minutes and 30 seconds. There is complete as capsule reached large bowel. There is prepyloric erythema and edema with few petechiae but no active bleeding noted. Jejunal diverticulum noted; best seen on image at 02:27:39. Careful examination of rest of her small bowel did not reveal any ulcers or erosions.  CBC Latest Ref Rng & Units 06/20/2019 06/07/2019 06/01/2019  WBC 3.8 - 10.8 Thousand/uL 6.3 7.9 4.0  Hemoglobin 11.7 - 15.5 g/dL 10.7(L) 10.6(L) 12.9  Hematocrit 35.0 - 45.0 % 31.8(L) 34.3(L) 41.2  Platelets 140 - 400 Thousand/uL 526(H) 291 339    Iron/TIBC/Ferritin/ %Sat    Component Value Date/Time   IRON 64 06/20/2019 1601   TIBC 281 06/20/2019 1601   FERRITIN 222 06/20/2019 1601   IRONPCTSAT 23 06/20/2019 1601        Review of Systems     Objective:   Physical Exam        Assessment & Plan:   1. Crohn's disease   2. Iron Deficiency Anemia  -celiac panel

## 2019-07-03 ENCOUNTER — Ambulatory Visit (HOSPITAL_COMMUNITY): Payer: PRIVATE HEALTH INSURANCE

## 2019-07-03 ENCOUNTER — Other Ambulatory Visit: Payer: Self-pay

## 2019-07-03 ENCOUNTER — Encounter (HOSPITAL_COMMUNITY): Payer: Self-pay

## 2019-07-03 DIAGNOSIS — M25662 Stiffness of left knee, not elsewhere classified: Secondary | ICD-10-CM | POA: Diagnosis not present

## 2019-07-03 DIAGNOSIS — R29898 Other symptoms and signs involving the musculoskeletal system: Secondary | ICD-10-CM

## 2019-07-03 DIAGNOSIS — M6281 Muscle weakness (generalized): Secondary | ICD-10-CM

## 2019-07-03 DIAGNOSIS — R2689 Other abnormalities of gait and mobility: Secondary | ICD-10-CM

## 2019-07-03 DIAGNOSIS — M25562 Pain in left knee: Secondary | ICD-10-CM

## 2019-07-03 NOTE — Therapy (Signed)
[place], [place], [address] Phone: [phone]   Fax:  [phone]  Physical Therapy Treatment  Patient Details  Name: [name] MRN: [id number] Date of Birth: June 25, 1966 Referring Provider (PT): Paralee Cancel, MD   Encounter Date: 07/03/2019  PT End of Session - 07/03/19 1417    Visit Number  10    Number of Visits  16    Date for PT Re-Evaluation  07/20/19   Mini re-assess performed 06/28/19   Authorization Type  [employer] (1 eval and 16 treatment sessions approved)    Authorization Time Period  06/08/19 - 07/20/19    Authorization - Visit Number  10    Authorization - Number of Visits  17    PT Start Time  1346    PT Stop Time  1430    PT Time Calculation (min)  44 min    Activity Tolerance  Patient tolerated treatment well    Behavior During Therapy  [facility] for tasks assessed/performed       Past Medical History:  Diagnosis Date  . Anxiety   . Arthritis   . Asthma   . Cervical hyperplasia    hisotry of  . Chest pain    cath 2005 norm cors, repeat cath Feb 2011 normal cors and only minimally  elevated pulmonary pressures  . Crohn disease ([place])   . Diverticulosis   . GERD (gastroesophageal reflux disease)   . Hiatal hernia   . History of iron deficiency anemia   . Hypertension   . Migraines   . Obesity   . Palpitations   . Pneumonia 2008   14 days hospitalization, bilateral  . PONV (postoperative nausea and vomiting)   . Sleep apnea sleep study 11/03/2009    cpap; does sometimes, doesnt use every night. weight loss no longer using CPAP    Past Surgical History:  Procedure Laterality Date  . ABDOMINAL HYSTERECTOMY    . ANKLE ARTHROSCOPY  06/01/2012   Procedure: ANKLE ARTHROSCOPY;  Surgeon: Colin Rhein, MD;  Location: [place];  Service: Orthopedics;  Laterality: Left;  left ankle arthorsocpy with extensive debridement and gastroc slide  . ANKLE SURGERY Left  05/11/2018  . BIOPSY  04/26/2019   Procedure: BIOPSY;  Surgeon: Rogene Houston, MD;  [facility];  Service: Endoscopy;;  ileum colon  . CARDIAC CATHETERIZATION  11/22/2009 and 2005   WNL  . CESAREAN SECTION    . CHOLECYSTECTOMY  09/08/2006   lap. chole.  . CHONDROPLASTY  06/17/2012   Procedure: CHONDROPLASTY;  Surgeon: Carole Civil, MD;  [facility];  Service: Orthopedics;  Laterality: Right;  right patella  . COLONOSCOPY N/A 04/26/2019   Procedure: COLONOSCOPY;  Surgeon: Rogene Houston, MD;  [facility];  Service: Endoscopy;  Laterality: N/A;  100  . ESOPHAGOGASTRODUODENOSCOPY N/A 05/14/2015   Procedure: ESOPHAGOGASTRODUODENOSCOPY (EGD);  Surgeon: Rogene Houston, MD;  [facility];  Service: Endoscopy;  Laterality: N/A;  730  . GIVENS CAPSULE STUDY N/A 05/24/2019   Procedure: GIVENS CAPSULE STUDY;  Surgeon: Rogene Houston, MD;  [facility];  Service: Endoscopy;  Laterality: N/A;  7:30  . INGUINAL HERNIA REPAIR  10/30/2008   right  . KNEE ARTHROSCOPY Right 2013  . KNEE ARTHROSCOPY WITH LATERAL MENISECTOMY Left 12/23/2016   Procedure: LEFT KNEE ARTHROSCOPY WITH LATERAL MENISECTOMY;  Surgeon: Carole Civil, MD;  [facility];  Service: Orthopedics;  Laterality: Left;  . KNEE ARTHROSCOPY WITH MEDIAL MENISECTOMY Left 12/23/2016   Procedure: LEFT KNEE ARTHROSCOPY WITH MEDIAL MENISECTOMY CHONDROPLASTY PATELLA  AND MEDIAL FEMORAL CONDYLE LEFT KNEE;  Surgeon: Carole Civil, MD;  [facility];  Service: Orthopedics;  Laterality: Left;  . SHOULDER ARTHROSCOPY WITH SUBACROMIAL DECOMPRESSION AND BICEP TENDON REPAIR Left 12/21/2017   Procedure: Left shoulder arthroscopic biceps tenodesis, SAD, DCR and labrum debridement;  Surgeon: Nicholes Stairs, MD;  Location: [place];  Service: Orthopedics;  Laterality: Left;  120 mins  . SHOULDER SURGERY     right x 2   . TOTAL KNEE ARTHROPLASTY Left 06/06/2019   Procedure: TOTAL KNEE  ARTHROPLASTY;  Surgeon: Paralee Cancel, MD;  [facility];  Service: Orthopedics;  Laterality: Left;  70 mins    There were no vitals filed for this visit.  Subjective Assessment - 07/03/19 1352    Subjective  Pt reports her leg won't stop feeling sore and achy, it is affecting her sleep and she can't take NSAIDs.    Pertinent History  Left TKA 06/06/19, see above for further surgical history    Limitations  Standing;Walking;House hold activities    How long can you sit comfortably?  Not limited    How long can you stand comfortably?  5 minutes or less    How long can you walk comfortably?  5 minutes or less    Patient Stated Goals  Improving walking    Currently in Pain?  Yes    Pain Score  4     Pain Location  Leg    Pain Orientation  Left    Pain Descriptors / Indicators  Aching    Pain Type  Surgical pain    Pain Onset  1 to 4 weeks ago    Pain Frequency  Constant    Aggravating Factors   standing, walking    Pain Relieving Factors  ice    Effect of Pain on Daily Activities  moderately affects              [facility] Adult PT Treatment/Exercise - 07/03/19 0001      Knee/Hip Exercises: Stretches   Active Hamstring Stretch  Left;3 reps;30 seconds    Active Hamstring Stretch Limitations  12" box    Quad Stretch  3 reps;30 seconds    Quad Stretch Limitations  prone, therapist assisted      Knee/Hip Exercises: Standing   Wall Squat  5 reps;5 seconds   only able to tolerate 4 reps   Gait Training  gait training with SPC, heel-toe pattern, increasing L weight-shifting      Knee/Hip Exercises: Seated   Sit to Sand  10 reps   BTB around thighs for abduction     Knee/Hip Exercises: Supine   Heel Slides  Left;15 reps    Knee Extension  AROM    Knee Extension Limitations  3    Knee Flexion  AROM    Knee Flexion Limitations  105      Manual Therapy   Manual Therapy  Soft tissue mobilization    Manual therapy comments  All manual completed separately from other skilled  interventions    Soft tissue mobilization  instrument assisted STM to L quad and hamstring to reduce soreness and pain               PT Short Term Goals - 06/28/19 1500      PT SHORT TERM GOAL #1   Title  Patient  will demonstrate understanding and report regular compliance with HEP to improve knee AROM, lower extremity strength, and overall functional mobility.    Time  3    Period  Weeks    Status  Achieved    Target Date  06/29/19      PT SHORT TERM GOAL #2   Title  Patient will demonstrate left knee extension/flexion active range of motion of at least 5-95 degrees to assist with more normalized gait pattern and stair ambulation.    Time  3    Period  Weeks    Status  Achieved    Target Date  06/29/19      PT SHORT TERM GOAL #3   Title  Patient will demonstrate improvement of 1/2 MMT grade in all deficient musculature to assist with proper gait mechanics.     Baseline  06/28/19: Strength still deficient with functional strength assessment    Time  3    Period  Weeks    Status  On-going    Target Date  06/29/19        PT Long Term Goals - 06/28/19 1501      PT LONG TERM GOAL #1   Title  Patient will perform single limb stance on left/right lower extremity for 3 seconds in order to assist with stair ambulation.    Time  6    Period  Weeks    Status  On-going      PT LONG TERM GOAL #2   Title  Patient will demonstrate improvement of 1 MMT grade in all musculature tested as deficient at evaluation to improve gait mechanics and safety.    Baseline  06/28/19: Strength still deficient with functional strength assessment    Time  6    Period  Weeks    Status  On-going      PT LONG TERM GOAL #3   Title  Patient will improve ROM for left knee extension/flexion to 0-115 degrees to improve squatting, and other functional mobility.    Baseline  06/28/19: Left knee AROM 1-105 degrees    Time  6    Period  Weeks    Status  Partially Met      PT LONG TERM GOAL #4   Title   Patient will demonstrate ability to perform at least 8 sit to stands on the 30 second chair rise indicating improved balance and functional mobility.    Time  6    Period  Weeks    Status  On-going      PT LONG TERM GOAL #5   Title  Patient will report ability to ambulate for 10 minutes with least restrictive assistive device with no greater than 3/10 pain.    Time  6    Period  Weeks    Status  On-going      PT LONG TERM GOAL #6   Title  Patient will demonstrate ability to ambulate at a gait velocity of at least 0.8 m/s with LRAD on 2MWT indicating improved ability to ambulate safely household and limited community distances.    Time  6    Period  Weeks    Status  On-going            Plan - 07/03/19 1418    Clinical Impression Statement  Pt limited due to prolonged soreness throughout LLE. Began treatment session with manual instrument assisted STM to L quad and hamstring with stick to assist to improving pain and reducing soreness. Followed  up with hamstring and quad stretches to further reduce any tension and soreness. Added wall sits for targeted quad activation this date, but due to increased pain able to complete 4 reps. Added STS reps with BTB around thighs to activate glutes. Pt with good gait pattern, weight shifting to LLE and with equal bil step length, continuing to improve heel-toe pattern on L, but limited by L ankle surgery. Continue to progress as able.    Personal Factors and Comorbidities  Past/Current Experience    Examination-Activity Limitations  Stand;Locomotion Level;Bed Mobility;Bend;Transfers;Stairs;Squat    Examination-Participation Restrictions  Community Activity    Stability/Clinical Decision Making  Stable/Uncomplicated    Rehab Potential  Good    PT Frequency  3x / week   3x/week for 3 weeks and 2x/week for 3 weeks   PT Duration  6 weeks    PT Treatment/Interventions  ADLs/Self Care Home Management;Cryotherapy;Electrical Stimulation;DME  Instruction;Gait training;Stair training;Functional mobility training;Therapeutic activities;Moist Heat;Therapeutic exercise;Balance training;Neuromuscular re-education;Patient/family education;Orthotic Fit/Training;Manual techniques;Compression bandaging;Scar mobilization;Passive range of motion;Dry needling;Energy conservation;Taping    PT Next Visit Plan  L knee flexion AROM gradually progress strengthening and balance as able. Consider adding 4'' step-ups as able. Add hip abductor and adductor exercises.    PT Home Exercise Plan  Supine heel slides, quad sets, SAQ 10x 1-2 x/day. portal site mobilization, patella mobilization, heel slides with foot on wall Prone quadriceps stretch 3x30'' dail, self massage, butterfly stretch    Consulted and Agree with Plan of Care  Patient       Patient will benefit from skilled therapeutic intervention in order to improve the following deficits and impairments:  Abnormal gait, Increased fascial restricitons, Pain, Decreased mobility, Decreased activity tolerance, Decreased endurance, Decreased range of motion, Decreased strength, Hypomobility, Decreased balance, Difficulty walking, Increased edema, Impaired flexibility  Visit Diagnosis: Stiffness of left knee, not elsewhere classified  Acute pain of left knee  Muscle weakness (generalized)  Other symptoms and signs involving the musculoskeletal system  Other abnormalities of gait and mobility     Problem List Patient Active Problem List   Diagnosis Date Noted  . Iron deficiency anemia 06/20/2019  . S/P left TKA 06/06/2019  . Status post total left knee replacement 06/06/2019  . Special screening for malignant neoplasms, colon 03/28/2019  . Acute diverticulitis 09/26/2018  . LLQ abdominal pain 09/26/2018  . Acute Sigmoid diverticulitis 09/26/2018  . Derangement of anterior horn of lateral meniscus of left knee   . Derangement of posterior horn of medial meniscus of left knee   . Chest pain  04/25/2016  . Hypokalemia 04/25/2016  . Hyperglycemia 04/25/2016  . Diverticulitis of colon 07/17/2014  . Nausea without vomiting 07/17/2014  . Crohn disease ([place]) 07/17/2014  . Diverticulitis of colon without hemorrhage 08/10/2013  . History of arthroscopy of right knee 07/25/2012  . Difficulty in walking(719.7) 06/29/2012  . Weakness of right leg 06/29/2012  . Posterior tibial tendonitis 11/11/2011  . PTTD (posterior tibial tendon dysfunction) 11/11/2011  . Knee pain, right 11/11/2011  . Chest pain 07/16/2011  . Primary osteoarthritis of left knee 02/25/2010  . PAIN IN JOINT, MULTIPLE SITES 02/25/2010  . Essential hypertension 08/13/2009  . HIATAL HERNIA 08/13/2009  . PALPITATIONS 08/13/2009  . DYSPNEA 08/13/2009     Talbot Grumbling PT, DPT 07/03/19, 2:44 PM [place] [address] [place], [place], [address] Phone: [phone]   Fax:  [phone]  Name: [name] MRN: [id number] Date of Birth: 01/09/1966

## 2019-07-04 ENCOUNTER — Encounter (HOSPITAL_COMMUNITY): Payer: 59

## 2019-07-04 ENCOUNTER — Ambulatory Visit (INDEPENDENT_AMBULATORY_CARE_PROVIDER_SITE_OTHER): Payer: 59 | Admitting: Nurse Practitioner

## 2019-07-05 ENCOUNTER — Ambulatory Visit (HOSPITAL_COMMUNITY): Payer: PRIVATE HEALTH INSURANCE | Admitting: Physical Therapy

## 2019-07-05 ENCOUNTER — Other Ambulatory Visit: Payer: Self-pay

## 2019-07-05 ENCOUNTER — Encounter (HOSPITAL_COMMUNITY): Payer: Self-pay | Admitting: Physical Therapy

## 2019-07-05 DIAGNOSIS — M25662 Stiffness of left knee, not elsewhere classified: Secondary | ICD-10-CM

## 2019-07-05 DIAGNOSIS — M25562 Pain in left knee: Secondary | ICD-10-CM

## 2019-07-05 DIAGNOSIS — M6281 Muscle weakness (generalized): Secondary | ICD-10-CM

## 2019-07-05 DIAGNOSIS — R29898 Other symptoms and signs involving the musculoskeletal system: Secondary | ICD-10-CM

## 2019-07-05 DIAGNOSIS — R2689 Other abnormalities of gait and mobility: Secondary | ICD-10-CM

## 2019-07-05 NOTE — Therapy (Signed)
[place], [place], [address] Phone: [phone]   Fax:  [phone]  Physical Therapy Treatment  Patient Details  Name: [name] MRN: [id number] Date of Birth: [birth year] Referring Provider (PT): Paralee Cancel, MD   Encounter Date: 07/05/2019  PT End of Session - 07/05/19 1435    Visit Number  11    Number of Visits  16    Date for PT Re-Evaluation  07/20/19   Mini re-assess performed 06/28/19   Authorization Type  [employer]'s [employer] (1 eval and 16 treatment sessions approved)    Authorization Time Period  06/08/19 - 07/20/19    Authorization - Visit Number  11    Authorization - Number of Visits  17    PT Start Time  3235    PT Stop Time  1430    PT Time Calculation (min)  41 min    Activity Tolerance  Patient tolerated treatment well    Behavior During Therapy  [facility] for tasks assessed/performed       Past Medical History:  Diagnosis Date  . Anxiety   . Arthritis   . Asthma   . Cervical hyperplasia    hisotry of  . Chest pain    cath 2005 norm cors, repeat cath Feb 2011 normal cors and only minimally  elevated pulmonary pressures  . Crohn disease ([place])   . Diverticulosis   . GERD (gastroesophageal reflux disease)   . Hiatal hernia   . History of iron deficiency anemia   . Hypertension   . Migraines   . Obesity   . Palpitations   . Pneumonia 2008   14 days hospitalization, bilateral  . PONV (postoperative nausea and vomiting)   . Sleep apnea sleep study 11/03/2009    cpap; does sometimes, doesnt use every night. weight loss no longer using CPAP    Past Surgical History:  Procedure Laterality Date  . ABDOMINAL HYSTERECTOMY    . ANKLE ARTHROSCOPY  06/01/2012   Procedure: ANKLE ARTHROSCOPY;  Surgeon: Colin Rhein, MD;  Location: [place];  Service: Orthopedics;  Laterality: Left;  left ankle arthorsocpy with extensive debridement and gastroc slide  . ANKLE SURGERY Left  05/11/2018  . BIOPSY  04/26/2019   Procedure: BIOPSY;  Surgeon: Rogene Houston, MD;  [facility];  Service: Endoscopy;;  ileum colon  . CARDIAC CATHETERIZATION  11/22/2009 and 2005   WNL  . CESAREAN SECTION    . CHOLECYSTECTOMY  09/08/2006   lap. chole.  . CHONDROPLASTY  06/17/2012   Procedure: CHONDROPLASTY;  Surgeon: Carole Civil, MD;  [facility];  Service: Orthopedics;  Laterality: Right;  right patella  . COLONOSCOPY N/A 04/26/2019   Procedure: COLONOSCOPY;  Surgeon: Rogene Houston, MD;  [facility];  Service: Endoscopy;  Laterality: N/A;  100  . ESOPHAGOGASTRODUODENOSCOPY N/A 05/14/2015   Procedure: ESOPHAGOGASTRODUODENOSCOPY (EGD);  Surgeon: Rogene Houston, MD;  [facility];  Service: Endoscopy;  Laterality: N/A;  730  . GIVENS CAPSULE STUDY N/A 05/24/2019   Procedure: GIVENS CAPSULE STUDY;  Surgeon: Rogene Houston, MD;  [facility];  Service: Endoscopy;  Laterality: N/A;  7:30  . INGUINAL HERNIA REPAIR  10/30/2008   right  . KNEE ARTHROSCOPY Right 2013  . KNEE ARTHROSCOPY WITH LATERAL MENISECTOMY Left 12/23/2016   Procedure: LEFT KNEE ARTHROSCOPY WITH LATERAL MENISECTOMY;  Surgeon: Carole Civil, MD;  [facility];  Service: Orthopedics;  Laterality: Left;  . KNEE ARTHROSCOPY WITH MEDIAL MENISECTOMY Left 12/23/2016   Procedure: LEFT KNEE ARTHROSCOPY WITH MEDIAL MENISECTOMY CHONDROPLASTY PATELLA  AND MEDIAL FEMORAL CONDYLE LEFT KNEE;  Surgeon: Carole Civil, MD;  [facility];  Service: Orthopedics;  Laterality: Left;  . SHOULDER ARTHROSCOPY WITH SUBACROMIAL DECOMPRESSION AND BICEP TENDON REPAIR Left 12/21/2017   Procedure: Left shoulder arthroscopic biceps tenodesis, SAD, DCR and labrum debridement;  Surgeon: Nicholes Stairs, MD;  Location: [place];  Service: Orthopedics;  Laterality: Left;  120 mins  . SHOULDER SURGERY     right x 2   . TOTAL KNEE ARTHROPLASTY Left 06/06/2019   Procedure: TOTAL KNEE  ARTHROPLASTY;  Surgeon: Paralee Cancel, MD;  [facility];  Service: Orthopedics;  Laterality: Left;  70 mins    There were no vitals filed for this visit.  Subjective Assessment - 07/05/19 1353    Subjective  Patient reported some pain in left knee. Patient reported she feels like she's still having issues with her endurance.    Pertinent History  Left TKA 06/06/19, see above for further surgical history    Limitations  Standing;Walking;House hold activities    How long can you sit comfortably?  Not limited    How long can you stand comfortably?  5 minutes or less    How long can you walk comfortably?  5 minutes or less    Patient Stated Goals  Improving walking    Currently in Pain?  Yes    Pain Score  4     Pain Location  Knee    Pain Onset  1 to 4 weeks ago                       [facility] Adult PT Treatment/Exercise - 07/05/19 0001      Knee/Hip Exercises: Stretches   Active Hamstring Stretch  Left;3 reps;30 seconds    Active Hamstring Stretch Limitations  12" box    Knee: Self-Stretch to increase Flexion  Left;10 seconds   10x on 12 in box     Knee/Hip Exercises: Standing   Gait Training  Clinic ambulation with SPC (220 ft)      Knee/Hip Exercises: Supine   Heel Slides  Left;2 sets;15 reps    Knee Extension  AROM    Knee Extension Limitations  1    Knee Flexion  AROM    Knee Flexion Limitations  111      Manual Therapy   Manual Therapy  Soft tissue mobilization;Passive ROM    Manual therapy comments  All manual completed separately from other skilled interventions    Soft tissue mobilization  STM to L quad and hamstring to reduce soreness and pain    Passive ROM  L knee flexion PROM in prone               PT Short Term Goals - 06/28/19 1500      PT SHORT TERM GOAL #1   Title  Patient will demonstrate understanding and report regular compliance with HEP to improve knee AROM, lower extremity strength, and overall functional mobility.    Time  3     Period  Weeks    Status  Achieved    Target Date  06/29/19      PT SHORT TERM GOAL #2   Title  Patient will demonstrate left knee extension/flexion active range of motion of at least 5-95 degrees to assist with more normalized gait pattern and stair  ambulation.    Time  3    Period  Weeks    Status  Achieved    Target Date  06/29/19      PT SHORT TERM GOAL #3   Title  Patient will demonstrate improvement of 1/2 MMT grade in all deficient musculature to assist with proper gait mechanics.     Baseline  06/28/19: Strength still deficient with functional strength assessment    Time  3    Period  Weeks    Status  On-going    Target Date  06/29/19        PT Long Term Goals - 06/28/19 1501      PT LONG TERM GOAL #1   Title  Patient will perform single limb stance on left/right lower extremity for 3 seconds in order to assist with stair ambulation.    Time  6    Period  Weeks    Status  On-going      PT LONG TERM GOAL #2   Title  Patient will demonstrate improvement of 1 MMT grade in all musculature tested as deficient at evaluation to improve gait mechanics and safety.    Baseline  06/28/19: Strength still deficient with functional strength assessment    Time  6    Period  Weeks    Status  On-going      PT LONG TERM GOAL #3   Title  Patient will improve ROM for left knee extension/flexion to 0-115 degrees to improve squatting, and other functional mobility.    Baseline  06/28/19: Left knee AROM 1-105 degrees    Time  6    Period  Weeks    Status  Partially Met      PT LONG TERM GOAL #4   Title  Patient will demonstrate ability to perform at least 8 sit to stands on the 30 second chair rise indicating improved balance and functional mobility.    Time  6    Period  Weeks    Status  On-going      PT LONG TERM GOAL #5   Title  Patient will report ability to ambulate for 10 minutes with least restrictive assistive device with no greater than 3/10 pain.    Time  6    Period   Weeks    Status  On-going      PT LONG TERM GOAL #6   Title  Patient will demonstrate ability to ambulate at a gait velocity of at least 0.8 m/s with LRAD on 2MWT indicating improved ability to ambulate safely household and limited community distances.    Time  6    Period  Weeks    Status  On-going            Plan - 07/05/19 1524    Clinical Impression Statement  Focused on left knee mobility this session and decreasing pain. Began with standing stretches and continued to mat exercises and manual therapy including soft tissue mobilization to left quadriceps and hamstrings. Patient's left knee flexion AROM was initially 105, but following heel slides increased to 111 degrees. Plan to continue focusing on left knee flexion AROM and progressing to functional strengthening as able.    Personal Factors and Comorbidities  Past/Current Experience    Examination-Activity Limitations  Stand;Locomotion Level;Bed Mobility;Bend;Transfers;Stairs;Squat    Examination-Participation Restrictions  Community Activity    Stability/Clinical Decision Making  Stable/Uncomplicated    Rehab Potential  Good    PT Frequency  3x / week  3x/week for 3 weeks and 2x/week for 3 weeks   PT Duration  6 weeks    PT Treatment/Interventions  ADLs/Self Care Home Management;Cryotherapy;Electrical Stimulation;DME Instruction;Gait training;Stair training;Functional mobility training;Therapeutic activities;Moist Heat;Therapeutic exercise;Balance training;Neuromuscular re-education;Patient/family education;Orthotic Fit/Training;Manual techniques;Compression bandaging;Scar mobilization;Passive range of motion;Dry needling;Energy conservation;Taping    PT Next Visit Plan  L knee flexion AROM gradually progress strengthening and balance as able. Consider adding 4'' step-ups next session. Add hip abductor and adductor exercises.    PT Home Exercise Plan  Supine heel slides, quad sets, SAQ 10x 1-2 x/day. portal site mobilization,  patella mobilization, heel slides with foot on wall Prone quadriceps stretch 3x30'' dail, self massage, butterfly stretch    Consulted and Agree with Plan of Care  Patient       Patient will benefit from skilled therapeutic intervention in order to improve the following deficits and impairments:  Abnormal gait, Increased fascial restricitons, Pain, Decreased mobility, Decreased activity tolerance, Decreased endurance, Decreased range of motion, Decreased strength, Hypomobility, Decreased balance, Difficulty walking, Increased edema, Impaired flexibility  Visit Diagnosis: Stiffness of left knee, not elsewhere classified  Acute pain of left knee  Muscle weakness (generalized)  Other symptoms and signs involving the musculoskeletal system  Other abnormalities of gait and mobility     Problem List Patient Active Problem List   Diagnosis Date Noted  . Iron deficiency anemia 06/20/2019  . S/P left TKA 06/06/2019  . Status post total left knee replacement 06/06/2019  . Special screening for malignant neoplasms, colon 03/28/2019  . Acute diverticulitis 09/26/2018  . LLQ abdominal pain 09/26/2018  . Acute Sigmoid diverticulitis 09/26/2018  . Derangement of anterior horn of lateral meniscus of left knee   . Derangement of posterior horn of medial meniscus of left knee   . Chest pain 04/25/2016  . Hypokalemia 04/25/2016  . Hyperglycemia 04/25/2016  . Diverticulitis of colon 07/17/2014  . Nausea without vomiting 07/17/2014  . Crohn disease ([place]) 07/17/2014  . Diverticulitis of colon without hemorrhage 08/10/2013  . History of arthroscopy of right knee 07/25/2012  . Difficulty in walking(719.7) 06/29/2012  . Weakness of right leg 06/29/2012  . Posterior tibial tendonitis 11/11/2011  . PTTD (posterior tibial tendon dysfunction) 11/11/2011  . Knee pain, right 11/11/2011  . Chest pain 07/16/2011  . Primary osteoarthritis of left knee 02/25/2010  . PAIN IN JOINT, MULTIPLE SITES  02/25/2010  . Essential hypertension 08/13/2009  . HIATAL HERNIA 08/13/2009  . PALPITATIONS 08/13/2009  . DYSPNEA 08/13/2009   [name] PT, DPT 3:27 PM, 07/05/19 [place], [place], [address] Phone: [phone]   Fax:  [phone]  Name: CAITLEN WORTH MRN: [id number] Date of Birth: [birth year]

## 2019-07-06 ENCOUNTER — Encounter (HOSPITAL_COMMUNITY): Payer: 59

## 2019-07-07 ENCOUNTER — Ambulatory Visit (HOSPITAL_COMMUNITY): Payer: PRIVATE HEALTH INSURANCE | Attending: Orthopedic Surgery

## 2019-07-07 ENCOUNTER — Encounter (HOSPITAL_COMMUNITY): Payer: Self-pay

## 2019-07-07 ENCOUNTER — Other Ambulatory Visit: Payer: Self-pay

## 2019-07-07 DIAGNOSIS — M6281 Muscle weakness (generalized): Secondary | ICD-10-CM | POA: Diagnosis present

## 2019-07-07 DIAGNOSIS — R2689 Other abnormalities of gait and mobility: Secondary | ICD-10-CM | POA: Diagnosis present

## 2019-07-07 DIAGNOSIS — M25562 Pain in left knee: Secondary | ICD-10-CM | POA: Diagnosis present

## 2019-07-07 DIAGNOSIS — M25662 Stiffness of left knee, not elsewhere classified: Secondary | ICD-10-CM | POA: Diagnosis present

## 2019-07-07 DIAGNOSIS — R29898 Other symptoms and signs involving the musculoskeletal system: Secondary | ICD-10-CM | POA: Diagnosis present

## 2019-07-07 NOTE — Therapy (Addendum)
[place], [place], [address] Phone: [phone]   Fax:  [phone]  Physical Therapy Treatment  Patient Details  Name: [name] MRN: [id number] Date of Birth: 1965/10/16 Referring Provider (PT): Paralee Cancel, MD   Encounter Date: 07/07/2019  PT End of Session - 07/07/19 0915    Visit Number  12    Number of Visits  16    Date for PT Re-Evaluation  07/20/19   Mini re-assess performed 06/28/19   Authorization Type  [employer] (1 eval and 16 treatment sessions approved)    Authorization Time Period  06/08/19 - 07/20/19    Authorization - Visit Number  12   Authorization - Number of Visits  17    PT Start Time  9628   pt arrived late   PT Stop Time  0942    PT Time Calculation (min)  28 min    Activity Tolerance  Patient tolerated treatment well    Behavior During Therapy  [facility] for tasks assessed/performed       Past Medical History:  Diagnosis Date  . Anxiety   . Arthritis   . Asthma   . Cervical hyperplasia    hisotry of  . Chest pain    cath 2005 norm cors, repeat cath Feb 2011 normal cors and only minimally  elevated pulmonary pressures  . Crohn disease ([place])   . Diverticulosis   . GERD (gastroesophageal reflux disease)   . Hiatal hernia   . History of iron deficiency anemia   . Hypertension   . Migraines   . Obesity   . Palpitations   . Pneumonia 2008   14 days hospitalization, bilateral  . PONV (postoperative nausea and vomiting)   . Sleep apnea sleep study 11/03/2009    cpap; does sometimes, doesnt use every night. weight loss no longer using CPAP    Past Surgical History:  Procedure Laterality Date  . ABDOMINAL HYSTERECTOMY    . ANKLE ARTHROSCOPY  06/01/2012   Procedure: ANKLE ARTHROSCOPY;  Surgeon: Colin Rhein, MD;  Location: [place];  Service: Orthopedics;  Laterality: Left;  left ankle arthorsocpy with extensive debridement and gastroc slide  . ANKLE  SURGERY Left 05/11/2018  . BIOPSY  04/26/2019   Procedure: BIOPSY;  Surgeon: Rogene Houston, MD;  [facility];  Service: Endoscopy;;  ileum colon  . CARDIAC CATHETERIZATION  11/22/2009 and 2005   WNL  . CESAREAN SECTION    . CHOLECYSTECTOMY  09/08/2006   lap. chole.  . CHONDROPLASTY  06/17/2012   Procedure: CHONDROPLASTY;  Surgeon: Carole Civil, MD;  [facility];  Service: Orthopedics;  Laterality: Right;  right patella  . COLONOSCOPY N/A 04/26/2019   Procedure: COLONOSCOPY;  Surgeon: Rogene Houston, MD;  [facility];  Service: Endoscopy;  Laterality: N/A;  100  . ESOPHAGOGASTRODUODENOSCOPY N/A 05/14/2015   Procedure: ESOPHAGOGASTRODUODENOSCOPY (EGD);  Surgeon: Rogene Houston, MD;  [facility];  Service: Endoscopy;  Laterality: N/A;  730  . GIVENS CAPSULE STUDY N/A 05/24/2019   Procedure: GIVENS CAPSULE STUDY;  Surgeon: Rogene Houston, MD;  [facility];  Service: Endoscopy;  Laterality: N/A;  7:30  . INGUINAL HERNIA REPAIR  10/30/2008   right  . KNEE ARTHROSCOPY Right 2013  . KNEE ARTHROSCOPY WITH LATERAL MENISECTOMY Left 12/23/2016   Procedure: LEFT KNEE ARTHROSCOPY WITH LATERAL MENISECTOMY;  Surgeon: Carole Civil, MD;  [facility];  Service: Orthopedics;  Laterality: Left;  . KNEE ARTHROSCOPY WITH MEDIAL MENISECTOMY Left 12/23/2016   Procedure: LEFT KNEE ARTHROSCOPY WITH MEDIAL MENISECTOMY CHONDROPLASTY PATELLA  AND MEDIAL FEMORAL CONDYLE LEFT KNEE;  Surgeon: Carole Civil, MD;  [facility];  Service: Orthopedics;  Laterality: Left;  . SHOULDER ARTHROSCOPY WITH SUBACROMIAL DECOMPRESSION AND BICEP TENDON REPAIR Left 12/21/2017   Procedure: Left shoulder arthroscopic biceps tenodesis, SAD, DCR and labrum debridement;  Surgeon: Nicholes Stairs, MD;  Location: [place];  Service: Orthopedics;  Laterality: Left;  120 mins  . SHOULDER SURGERY     right x 2   . TOTAL KNEE ARTHROPLASTY Left 06/06/2019   Procedure:  TOTAL KNEE ARTHROPLASTY;  Surgeon: Paralee Cancel, MD;  [facility];  Service: Orthopedics;  Laterality: Left;  70 mins    There were no vitals filed for this visit.  Subjective Assessment - 07/07/19 0941    Subjective  Pt reports being so sore last night, didn't fall asleep until 3am. Pt continues to complain of muscle soreness throughout entire LLE.    Pertinent History  Left TKA 06/06/19, see above for further surgical history    Limitations  Standing;Walking;House hold activities    How long can you sit comfortably?  Not limited    How long can you stand comfortably?  5 minutes or less    How long can you walk comfortably?  5 minutes or less    Patient Stated Goals  Improving walking    Currently in Pain?  Yes    Pain Score  5     Pain Location  Knee    Pain Orientation  Left    Pain Descriptors / Indicators  Aching    Pain Type  Surgical pain    Pain Onset  1 to 4 weeks ago    Pain Frequency  Constant    Aggravating Factors   standing, walking    Pain Relieving Factors  ice    Effect of Pain on Daily Activities  moderately affects           [facility] Adult PT Treatment/Exercise - 07/07/19 0001      Knee/Hip Exercises: Aerobic   Stationary Bike  x4 min no resistance, for ROM      Knee/Hip Exercises: Standing   Lateral Step Up  Left;10 reps;Hand Hold: 2;Step Height: 4"    Forward Step Up  Left;10 reps;Hand Hold: 2;Step Height: 4"    Gait Training  Clinic ambulation without AD (220 ft)      Knee/Hip Exercises: Seated   Sit to Sand  5 reps   BTB around thighs for abduction        PT Education - 07/07/19 0941    Education Details  Exercise technique, continue HEP    Person(s) Educated  Patient    Methods  Explanation    Comprehension  Verbalized understanding       PT Short Term Goals - 06/28/19 1500      PT SHORT TERM GOAL #1   Title  Patient will demonstrate understanding and report regular compliance with HEP to improve knee AROM, lower extremity strength,  and overall functional mobility.    Time  3    Period  Weeks    Status  Achieved    Target Date  06/29/19      PT SHORT TERM GOAL #2   Title  Patient will demonstrate left knee extension/flexion active range of motion of at least 5-95 degrees to assist with more  normalized gait pattern and stair ambulation.    Time  3    Period  Weeks    Status  Achieved    Target Date  06/29/19      PT SHORT TERM GOAL #3   Title  Patient will demonstrate improvement of 1/2 MMT grade in all deficient musculature to assist with proper gait mechanics.     Baseline  06/28/19: Strength still deficient with functional strength assessment    Time  3    Period  Weeks    Status  On-going    Target Date  06/29/19        PT Long Term Goals - 06/28/19 1501      PT LONG TERM GOAL #1   Title  Patient will perform single limb stance on left/right lower extremity for 3 seconds in order to assist with stair ambulation.    Time  6    Period  Weeks    Status  On-going      PT LONG TERM GOAL #2   Title  Patient will demonstrate improvement of 1 MMT grade in all musculature tested as deficient at evaluation to improve gait mechanics and safety.    Baseline  06/28/19: Strength still deficient with functional strength assessment    Time  6    Period  Weeks    Status  On-going      PT LONG TERM GOAL #3   Title  Patient will improve ROM for left knee extension/flexion to 0-115 degrees to improve squatting, and other functional mobility.    Baseline  06/28/19: Left knee AROM 1-105 degrees    Time  6    Period  Weeks    Status  Partially Met      PT LONG TERM GOAL #4   Title  Patient will demonstrate ability to perform at least 8 sit to stands on the 30 second chair rise indicating improved balance and functional mobility.    Time  6    Period  Weeks    Status  On-going      PT LONG TERM GOAL #5   Title  Patient will report ability to ambulate for 10 minutes with least restrictive assistive device with no  greater than 3/10 pain.    Time  6    Period  Weeks    Status  On-going      PT LONG TERM GOAL #6   Title  Patient will demonstrate ability to ambulate at a gait velocity of at least 0.8 m/s with LRAD on 2MWT indicating improved ability to ambulate safely household and limited community distances.    Time  6    Period  Weeks    Status  On-going            Plan - 07/07/19 0946    Clinical Impression Statement  Pt arrived late this session due to oversleeping after poor nights sleep due to LLE pain/soreness. Added bicycle for knee ROM without resistance. Pt tolerated step ups to 4" step forward and lateral with verbal cues for pushing through heel to activate hamstring/glutes with rising. Pt attempts STS with BTB around thighs, but due to increased pain, only tolerates 3 reps before requiring seated rest break. Continue to progress as able.    Personal Factors and Comorbidities  Past/Current Experience    Examination-Activity Limitations  Stand;Locomotion Level;Bed Mobility;Bend;Transfers;Stairs;Squat    Examination-Participation Restrictions  Community Activity    Stability/Clinical Decision Making  Stable/Uncomplicated    Rehab  Potential  Good    PT Frequency  3x / week   3x/week for 3 weeks and 2x/week for 3 weeks   PT Duration  6 weeks    PT Treatment/Interventions  ADLs/Self Care Home Management;Cryotherapy;Electrical Stimulation;DME Instruction;Gait training;Stair training;Functional mobility training;Therapeutic activities;Moist Heat;Therapeutic exercise;Balance training;Neuromuscular re-education;Patient/family education;Orthotic Fit/Training;Manual techniques;Compression bandaging;Scar mobilization;Passive range of motion;Dry needling;Energy conservation;Taping    PT Next Visit Plan  L knee flexion AROM gradually progress strengthening and balance as able. Continue 4'' step-ups. Add hip abductor and adductor exercises.    PT Home Exercise Plan  Supine heel slides, quad sets, SAQ  10x 1-2 x/day. portal site mobilization, patella mobilization, heel slides with foot on wall Prone quadriceps stretch 3x30'' dail, self massage, butterfly stretch    Consulted and Agree with Plan of Care  Patient       Patient will benefit from skilled therapeutic intervention in order to improve the following deficits and impairments:  Abnormal gait, Increased fascial restricitons, Pain, Decreased mobility, Decreased activity tolerance, Decreased endurance, Decreased range of motion, Decreased strength, Hypomobility, Decreased balance, Difficulty walking, Increased edema, Impaired flexibility  Visit Diagnosis: Stiffness of left knee, not elsewhere classified  Acute pain of left knee  Muscle weakness (generalized)  Other symptoms and signs involving the musculoskeletal system  Other abnormalities of gait and mobility     Problem List Patient Active Problem List   Diagnosis Date Noted  . Iron deficiency anemia 06/20/2019  . S/P left TKA 06/06/2019  . Status post total left knee replacement 06/06/2019  . Special screening for malignant neoplasms, colon 03/28/2019  . Acute diverticulitis 09/26/2018  . LLQ abdominal pain 09/26/2018  . Acute Sigmoid diverticulitis 09/26/2018  . Derangement of anterior horn of lateral meniscus of left knee   . Derangement of posterior horn of medial meniscus of left knee   . Chest pain 04/25/2016  . Hypokalemia 04/25/2016  . Hyperglycemia 04/25/2016  . Diverticulitis of colon 07/17/2014  . Nausea without vomiting 07/17/2014  . Crohn disease ([place]) 07/17/2014  . Diverticulitis of colon without hemorrhage 08/10/2013  . History of arthroscopy of right knee 07/25/2012  . Difficulty in walking(719.7) 06/29/2012  . Weakness of right leg 06/29/2012  . Posterior tibial tendonitis 11/11/2011  . PTTD (posterior tibial tendon dysfunction) 11/11/2011  . Knee pain, right 11/11/2011  . Chest pain 07/16/2011  . Primary osteoarthritis of left knee 02/25/2010   . PAIN IN JOINT, MULTIPLE SITES 02/25/2010  . Essential hypertension 08/13/2009  . HIATAL HERNIA 08/13/2009  . PALPITATIONS 08/13/2009  . DYSPNEA 08/13/2009     Talbot Grumbling PT, DPT 07/07/19, 10:10 AM [place] [address] [place], [place], [address] Phone: [phone]   Fax:  [phone]  Name: [name] MRN: [id number] Date of Birth: [birth year]

## 2019-07-10 MED FILL — AMLODIPINE BESYLATE 5 MG TA: 5 | 90 days supply | Qty: 90 | Fill #0

## 2019-07-11 ENCOUNTER — Ambulatory Visit (HOSPITAL_COMMUNITY): Payer: PRIVATE HEALTH INSURANCE | Admitting: Physical Therapy

## 2019-07-11 ENCOUNTER — Encounter (HOSPITAL_COMMUNITY): Payer: 59 | Admitting: Physical Therapy

## 2019-07-11 ENCOUNTER — Encounter (HOSPITAL_COMMUNITY): Payer: Self-pay | Admitting: Physical Therapy

## 2019-07-11 ENCOUNTER — Other Ambulatory Visit: Payer: Self-pay

## 2019-07-11 DIAGNOSIS — M25662 Stiffness of left knee, not elsewhere classified: Secondary | ICD-10-CM

## 2019-07-11 DIAGNOSIS — M6281 Muscle weakness (generalized): Secondary | ICD-10-CM

## 2019-07-11 DIAGNOSIS — M25562 Pain in left knee: Secondary | ICD-10-CM

## 2019-07-11 NOTE — Therapy (Signed)
[place], [place], [address] Phone: [phone]   Fax:  [phone]  Physical Therapy Treatment  Patient Details  Name: [name] MRN: [id number] Date of Birth: 18-Jul-1966 Referring Provider (PT): Paralee Cancel, MD   Encounter Date: 07/11/2019  PT End of Session - 07/11/19 1318    Visit Number  13   corrected for date   Number of Visits  16    Date for PT Re-Evaluation  07/20/19   Mini re-assess performed 06/28/19   Authorization Type  [employer] (1 eval and 16 treatment sessions approved)    Authorization Time Period  06/08/19 - 07/20/19    Authorization - Visit Number  13   corrected for date   Authorization - Number of Visits  17    PT Start Time  1310    PT Stop Time  1352    PT Time Calculation (min)  42 min    Activity Tolerance  Patient tolerated treatment well    Behavior During Therapy  [facility] for tasks assessed/performed       Past Medical History:  Diagnosis Date  . Anxiety   . Arthritis   . Asthma   . Cervical hyperplasia    hisotry of  . Chest pain    cath 2005 norm cors, repeat cath Feb 2011 normal cors and only minimally  elevated pulmonary pressures  . Crohn disease ([place])   . Diverticulosis   . GERD (gastroesophageal reflux disease)   . Hiatal hernia   . History of iron deficiency anemia   . Hypertension   . Migraines   . Obesity   . Palpitations   . Pneumonia 2008   14 days hospitalization, bilateral  . PONV (postoperative nausea and vomiting)   . Sleep apnea sleep study 11/03/2009    cpap; does sometimes, doesnt use every night. weight loss no longer using CPAP    Past Surgical History:  Procedure Laterality Date  . ABDOMINAL HYSTERECTOMY    . ANKLE ARTHROSCOPY  06/01/2012   Procedure: ANKLE ARTHROSCOPY;  Surgeon: Colin Rhein, MD;  Location: [place];  Service: Orthopedics;  Laterality: Left;  left ankle arthorsocpy with extensive debridement and  gastroc slide  . ANKLE SURGERY Left 05/11/2018  . BIOPSY  04/26/2019   Procedure: BIOPSY;  Surgeon: Rogene Houston, MD;  [facility];  Service: Endoscopy;;  ileum colon  . CARDIAC CATHETERIZATION  11/22/2009 and 2005   WNL  . CESAREAN SECTION    . CHOLECYSTECTOMY  09/08/2006   lap. chole.  . CHONDROPLASTY  06/17/2012   Procedure: CHONDROPLASTY;  Surgeon: Carole Civil, MD;  [facility];  Service: Orthopedics;  Laterality: Right;  right patella  . COLONOSCOPY N/A 04/26/2019   Procedure: COLONOSCOPY;  Surgeon: Rogene Houston, MD;  [facility];  Service: Endoscopy;  Laterality: N/A;  100  . ESOPHAGOGASTRODUODENOSCOPY N/A 05/14/2015   Procedure: ESOPHAGOGASTRODUODENOSCOPY (EGD);  Surgeon: Rogene Houston, MD;  [facility];  Service: Endoscopy;  Laterality: N/A;  730  . GIVENS CAPSULE STUDY N/A 05/24/2019   Procedure: GIVENS CAPSULE STUDY;  Surgeon: Rogene Houston, MD;  [facility];  Service: Endoscopy;  Laterality: N/A;  7:30  . INGUINAL HERNIA REPAIR  10/30/2008   right  . KNEE ARTHROSCOPY Right 2013  . KNEE ARTHROSCOPY WITH LATERAL MENISECTOMY Left 12/23/2016   Procedure: LEFT KNEE ARTHROSCOPY WITH LATERAL MENISECTOMY;  Surgeon: Carole Civil,  MD;  [facility];  Service: Orthopedics;  Laterality: Left;  . KNEE ARTHROSCOPY WITH MEDIAL MENISECTOMY Left 12/23/2016   Procedure: LEFT KNEE ARTHROSCOPY WITH MEDIAL MENISECTOMY CHONDROPLASTY PATELLA  AND MEDIAL FEMORAL CONDYLE LEFT KNEE;  Surgeon: Carole Civil, MD;  [facility];  Service: Orthopedics;  Laterality: Left;  . SHOULDER ARTHROSCOPY WITH SUBACROMIAL DECOMPRESSION AND BICEP TENDON REPAIR Left 12/21/2017   Procedure: Left shoulder arthroscopic biceps tenodesis, SAD, DCR and labrum debridement;  Surgeon: Nicholes Stairs, MD;  Location: [place];  Service: Orthopedics;  Laterality: Left;  120 mins  . SHOULDER SURGERY     right x 2   . TOTAL KNEE ARTHROPLASTY Left  06/06/2019   Procedure: TOTAL KNEE ARTHROPLASTY;  Surgeon: Paralee Cancel, MD;  [facility];  Service: Orthopedics;  Laterality: Left;  70 mins    There were no vitals filed for this visit.  Subjective Assessment - 07/11/19 1313    Subjective  Patient reports mild pain on inside of LT knee today, and notes ongoing numbness about lateral aspect. Patient says that knee is doing better today than most days, however, likely due to limited activity so far today. Patient says she has been doing her HEP, and did her stretching before todays session, which she found helpful.    Pertinent History  Left TKA 06/06/19, see above for further surgical history    Limitations  Standing;Walking;House hold activities    How long can you sit comfortably?  Not limited    How long can you stand comfortably?  5 minutes or less    How long can you walk comfortably?  5 minutes or less    Patient Stated Goals  Improving walking    Currently in Pain?  Yes    Pain Score  3     Pain Location  Knee    Pain Orientation  Left    Pain Onset  1 to 4 weeks ago                       [facility] Adult PT Treatment/Exercise - 07/11/19 0001      Knee/Hip Exercises: Stretches   Knee: Self-Stretch to increase Flexion  Left;10 seconds   10x on 12 in box     Knee/Hip Exercises: Standing   Lateral Step Up  Left;Hand Hold: 2;Step Height: 4";15 reps    Forward Step Up  Left;Hand Hold: 2;Step Height: 4";15 reps    Gait Training  Clinic ambulation without AD (220 ft)      Knee/Hip Exercises: Supine   Bridges  1 set;15 reps   3 seconds   Knee Extension  AROM    Knee Extension Limitations  1    Knee Flexion  AROM    Knee Flexion Limitations  105      Knee/Hip Exercises: Sidelying   Clams  15 reps LT knee      Manual Therapy   Manual Therapy  Soft tissue mobilization;Passive ROM    Manual therapy comments  All manual completed separately from other skilled interventions    Soft tissue mobilization  STM to LT  quad and hamstring to reduce soreness and pain               PT Short Term Goals - 06/28/19 1500      PT SHORT TERM GOAL #1   Title  Patient will demonstrate understanding and report regular compliance with HEP to improve knee AROM, lower extremity strength, and overall  functional mobility.    Time  3    Period  Weeks    Status  Achieved    Target Date  06/29/19      PT SHORT TERM GOAL #2   Title  Patient will demonstrate left knee extension/flexion active range of motion of at least 5-95 degrees to assist with more normalized gait pattern and stair ambulation.    Time  3    Period  Weeks    Status  Achieved    Target Date  06/29/19      PT SHORT TERM GOAL #3   Title  Patient will demonstrate improvement of 1/2 MMT grade in all deficient musculature to assist with proper gait mechanics.     Baseline  06/28/19: Strength still deficient with functional strength assessment    Time  3    Period  Weeks    Status  On-going    Target Date  06/29/19        PT Long Term Goals - 06/28/19 1501      PT LONG TERM GOAL #1   Title  Patient will perform single limb stance on left/right lower extremity for 3 seconds in order to assist with stair ambulation.    Time  6    Period  Weeks    Status  On-going      PT LONG TERM GOAL #2   Title  Patient will demonstrate improvement of 1 MMT grade in all musculature tested as deficient at evaluation to improve gait mechanics and safety.    Baseline  06/28/19: Strength still deficient with functional strength assessment    Time  6    Period  Weeks    Status  On-going      PT LONG TERM GOAL #3   Title  Patient will improve ROM for left knee extension/flexion to 0-115 degrees to improve squatting, and other functional mobility.    Baseline  06/28/19: Left knee AROM 1-105 degrees    Time  6    Period  Weeks    Status  Partially Met      PT LONG TERM GOAL #4   Title  Patient will demonstrate ability to perform at least 8 sit to stands on  the 30 second chair rise indicating improved balance and functional mobility.    Time  6    Period  Weeks    Status  On-going      PT LONG TERM GOAL #5   Title  Patient will report ability to ambulate for 10 minutes with least restrictive assistive device with no greater than 3/10 pain.    Time  6    Period  Weeks    Status  On-going      PT LONG TERM GOAL #6   Title  Patient will demonstrate ability to ambulate at a gait velocity of at least 0.8 m/s with LRAD on 2MWT indicating improved ability to ambulate safely household and limited community distances.    Time  6    Period  Weeks    Status  On-going            Plan - 07/11/19 1344    Clinical Impression Statement  Patient arrived 10 minutes late to todays appointment. Began treatment with functional strengthening in standing, then transitioned to lunge stretch at 12 inch step for improved LT knee flexion. Patient transitioned to table exercise and performed added clamshells and bridging in supine. Patient cued on proper from and knee positioning  with these exercises. Manual therapy was performed to address muscle restriciton and complaint of pain at medial aspect of LT knee at end of session. Patient continues to be limited in LT knee flexion which is limiting funcitoanl mobility and ability to ambulate stairs efficiently.    Personal Factors and Comorbidities  Past/Current Experience    Examination-Activity Limitations  Stand;Locomotion Level;Bed Mobility;Bend;Transfers;Stairs;Squat    Examination-Participation Restrictions  Community Activity    Stability/Clinical Decision Making  Stable/Uncomplicated    Rehab Potential  Good    PT Frequency  3x / week   3x/week for 3 weeks and 2x/week for 3 weeks   PT Duration  6 weeks    PT Treatment/Interventions  ADLs/Self Care Home Management;Cryotherapy;Electrical Stimulation;DME Instruction;Gait training;Stair training;Functional mobility training;Therapeutic activities;Moist  Heat;Therapeutic exercise;Balance training;Neuromuscular re-education;Patient/family education;Orthotic Fit/Training;Manual techniques;Compression bandaging;Scar mobilization;Passive range of motion;Dry needling;Energy conservation;Taping    PT Next Visit Plan  Continue to focus on improving LT knee flexion AROM. Add contract relax technique for manual stretching of LT knee into flexion. Add SLS for improving control and stability about LT knee in standing.    PT Home Exercise Plan  Supine heel slides, quad sets, SAQ 10x 1-2 x/day. portal site mobilization, patella mobilization, heel slides with foot on wall Prone quadriceps stretch 3x30'' dail, self massage, butterfly stretch    Consulted and Agree with Plan of Care  Patient       Patient will benefit from skilled therapeutic intervention in order to improve the following deficits and impairments:  Abnormal gait, Increased fascial restricitons, Pain, Decreased mobility, Decreased activity tolerance, Decreased endurance, Decreased range of motion, Decreased strength, Hypomobility, Decreased balance, Difficulty walking, Increased edema, Impaired flexibility  Visit Diagnosis: Stiffness of left knee, not elsewhere classified  Acute pain of left knee  Muscle weakness (generalized)     Problem List Patient Active Problem List   Diagnosis Date Noted  . Iron deficiency anemia 06/20/2019  . S/P left TKA 06/06/2019  . Status post total left knee replacement 06/06/2019  . Special screening for malignant neoplasms, colon 03/28/2019  . Acute diverticulitis 09/26/2018  . LLQ abdominal pain 09/26/2018  . Acute Sigmoid diverticulitis 09/26/2018  . Derangement of anterior horn of lateral meniscus of left knee   . Derangement of posterior horn of medial meniscus of left knee   . Chest pain 04/25/2016  . Hypokalemia 04/25/2016  . Hyperglycemia 04/25/2016  . Diverticulitis of colon 07/17/2014  . Nausea without vomiting 07/17/2014  . Crohn disease  ([place]) 07/17/2014  . Diverticulitis of colon without hemorrhage 08/10/2013  . History of arthroscopy of right knee 07/25/2012  . Difficulty in walking(719.7) 06/29/2012  . Weakness of right leg 06/29/2012  . Posterior tibial tendonitis 11/11/2011  . PTTD (posterior tibial tendon dysfunction) 11/11/2011  . Knee pain, right 11/11/2011  . Chest pain 07/16/2011  . Primary osteoarthritis of left knee 02/25/2010  . PAIN IN JOINT, MULTIPLE SITES 02/25/2010  . Essential hypertension 08/13/2009  . HIATAL HERNIA 08/13/2009  . PALPITATIONS 08/13/2009  . DYSPNEA 08/13/2009    Clarene Critchley PT, DPT 2:00 PM, 07/11/19 [place], [place], [address] Phone: [phone]   Fax:  [phone]  Name: [name] MRN: [id number] Date of Birth: 1966-08-19

## 2019-07-13 ENCOUNTER — Encounter (HOSPITAL_COMMUNITY): Payer: 59 | Admitting: Physical Therapy

## 2019-07-13 ENCOUNTER — Encounter (HOSPITAL_COMMUNITY): Payer: Self-pay | Admitting: Physical Therapy

## 2019-07-13 ENCOUNTER — Ambulatory Visit (HOSPITAL_COMMUNITY): Payer: PRIVATE HEALTH INSURANCE | Admitting: Physical Therapy

## 2019-07-13 ENCOUNTER — Other Ambulatory Visit: Payer: Self-pay

## 2019-07-13 DIAGNOSIS — M25662 Stiffness of left knee, not elsewhere classified: Secondary | ICD-10-CM | POA: Diagnosis not present

## 2019-07-13 DIAGNOSIS — M25562 Pain in left knee: Secondary | ICD-10-CM

## 2019-07-13 DIAGNOSIS — M6281 Muscle weakness (generalized): Secondary | ICD-10-CM

## 2019-07-13 NOTE — Therapy (Addendum)
[facility] [address] [place], [place], [address] Phone: [phone]   Fax:  [phone]  Physical Therapy Treatment  Patient Details  Name: [name] MRN: [id number] Date of Birth: Sep 08, 1966 Referring Provider (PT): Paralee Cancel, MD   Encounter Date: 07/13/2019  PT End of Session - 07/13/19 1350    Visit Number  14    Number of Visits  16    Date for PT Re-Evaluation  07/20/19   Mini re-assess performed 06/28/19   Authorization Type  [employer] (1 eval and 16 treatment sessions approved)    Authorization Time Period  06/08/19 - 07/20/19    Authorization - Visit Number  [id number]    Authorization - Number of Visits  17    PT Start Time  1310   patient arrived late to appointment   PT Stop Time  1350    PT Time Calculation (min)  40 min    Equipment Utilized During Treatment  Other (comment)   spc   Activity Tolerance  Patient tolerated treatment well    Behavior During Therapy  [facility] for tasks assessed/performed       Past Medical History:  Diagnosis Date  . Anxiety   . Arthritis   . Asthma   . Cervical hyperplasia    hisotry of  . Chest pain    cath 2005 norm cors, repeat cath Feb 2011 normal cors and only minimally  elevated pulmonary pressures  . Crohn disease ([place])   . Diverticulosis   . GERD (gastroesophageal reflux disease)   . Hiatal hernia   . History of iron deficiency anemia   . Hypertension   . Migraines   . Obesity   . Palpitations   . Pneumonia 2008   14 days hospitalization, bilateral  . PONV (postoperative nausea and vomiting)   . Sleep apnea sleep study 11/03/2009    cpap; does sometimes, doesnt use every night. weight loss no longer using CPAP    Past Surgical History:  Procedure Laterality Date  . ABDOMINAL HYSTERECTOMY    . ANKLE ARTHROSCOPY  06/01/2012   Procedure: ANKLE ARTHROSCOPY;  Surgeon: Colin Rhein, MD;  Location: [place];  Service: Orthopedics;  Laterality: Left;   left ankle arthorsocpy with extensive debridement and gastroc slide  . ANKLE SURGERY Left 05/11/2018  . BIOPSY  04/26/2019   Procedure: BIOPSY;  Surgeon: Rogene Houston, MD;  [facility];  Service: Endoscopy;;  ileum colon  . CARDIAC CATHETERIZATION  11/22/2009 and 2005   WNL  . CESAREAN SECTION    . CHOLECYSTECTOMY  09/08/2006   lap. chole.  . CHONDROPLASTY  06/17/2012   Procedure: CHONDROPLASTY;  Surgeon: Carole Civil, MD;  [facility];  Service: Orthopedics;  Laterality: Right;  right patella  . COLONOSCOPY N/A 04/26/2019   Procedure: COLONOSCOPY;  Surgeon: Rogene Houston, MD;  [facility];  Service: Endoscopy;  Laterality: N/A;  100  . ESOPHAGOGASTRODUODENOSCOPY N/A 05/14/2015   Procedure: ESOPHAGOGASTRODUODENOSCOPY (EGD);  Surgeon: Rogene Houston, MD;  [facility];  Service: Endoscopy;  Laterality: N/A;  730  . GIVENS CAPSULE STUDY N/A 05/24/2019   Procedure: GIVENS CAPSULE STUDY;  Surgeon: Rogene Houston, MD;  [facility];  Service: Endoscopy;  Laterality: N/A;  7:30  . INGUINAL HERNIA REPAIR  10/30/2008   right  . KNEE ARTHROSCOPY Right 2013  . KNEE ARTHROSCOPY WITH LATERAL MENISECTOMY Left 12/23/2016   Procedure: LEFT  KNEE ARTHROSCOPY WITH LATERAL MENISECTOMY;  Surgeon: Carole Civil, MD;  Location: [facility];  Service: Orthopedics;  Laterality: Left;  . KNEE ARTHROSCOPY WITH MEDIAL MENISECTOMY Left 12/23/2016   Procedure: LEFT KNEE ARTHROSCOPY WITH MEDIAL MENISECTOMY CHONDROPLASTY PATELLA  AND MEDIAL FEMORAL CONDYLE LEFT KNEE;  Surgeon: Carole Civil, MD;  Location: [facility];  Service: Orthopedics;  Laterality: Left;  . SHOULDER ARTHROSCOPY WITH SUBACROMIAL DECOMPRESSION AND BICEP TENDON REPAIR Left 12/21/2017   Procedure: Left shoulder arthroscopic biceps tenodesis, SAD, DCR and labrum debridement;  Surgeon: Nicholes Stairs, MD;  Location: [facility];  Service: Orthopedics;  Laterality: Left;  120 mins  . SHOULDER  SURGERY     right x 2   . TOTAL KNEE ARTHROPLASTY Left 06/06/2019   Procedure: TOTAL KNEE ARTHROPLASTY;  Surgeon: Paralee Cancel, MD;  Location: [facility];  Service: Orthopedics;  Laterality: Left;  70 mins    There were no vitals filed for this visit.  Subjective Assessment - 07/13/19 1315    Subjective  Patient says knee was very sore after last session. Patient says she has been icing and elevating to reduce pain, and is feeling a little better today but is still stiff. Patient says she rested most of yesterday to give herself time to recover, but has still been doing HEP.    Pertinent History  Left TKA 06/06/19, see above for further surgical history    Limitations  Standing;Walking;House hold activities    How long can you sit comfortably?  Not limited    How long can you stand comfortably?  5 minutes or less    How long can you walk comfortably?  5 minutes or less    Patient Stated Goals  Improving walking    Currently in Pain?  Yes    Pain Score  5     Pain Location  Knee    Pain Orientation  Left;Medial    Pain Descriptors / Indicators  Aching    Pain Onset  1 to 4 weeks ago                       [facility] Adult PT Treatment/Exercise - 07/13/19 0001      Knee/Hip Exercises: Stretches   Knee: Self-Stretch to increase Flexion  Left;10 seconds   10x on 12 inch box   Gastroc Stretch  Both;3 reps;30 seconds   on slant board     Knee/Hip Exercises: Standing   Forward Step Up  Left;10 reps;Hand Hold: 1;Step Height: 4"   step up and over   Gait Training  Clinic ambulation with SPC (552f)      Knee/Hip Exercises: Seated   Heel Slides  Left;1 set;5 reps;AAROM   with therapist assist; at edge of mat   Sit to Sand  10 reps   from high mat     Knee/Hip Exercises: Supine   Heel Slides  AAROM;1 set;10 reps   with therapist assist   Knee Extension  AROM    Knee Extension Limitations  1    Knee Flexion  AROM    Knee Flexion Limitations  108      Manual Therapy   Manual  Therapy  Soft tissue mobilization;Joint mobilization    Manual therapy comments  All manual completed separately from other skilled interventions    Joint Mobilization  LT patellar mobs in all planes grade II for decreased pain and improved knee AROM     Soft tissue mobilization  STM to LT  distal and adductors             PT Education - 07/13/19 1345    Education Details  Patient educated on body mechanics with sit to stands, and to avoid valgus knee angle with knee driver stretching on 12 in box.    Person(s) Educated  Patient    Methods  Explanation    Comprehension  Verbalized understanding;Returned demonstration       PT Short Term Goals - 06/28/19 1500      PT SHORT TERM GOAL #1   Title  Patient will demonstrate understanding and report regular compliance with HEP to improve knee AROM, lower extremity strength, and overall functional mobility.    Time  3    Period  Weeks    Status  Achieved    Target Date  06/29/19      PT SHORT TERM GOAL #2   Title  Patient will demonstrate left knee extension/flexion active range of motion of at least 5-95 degrees to assist with more normalized gait pattern and stair ambulation.    Time  3    Period  Weeks    Status  Achieved    Target Date  06/29/19      PT SHORT TERM GOAL #3   Title  Patient will demonstrate improvement of 1/2 MMT grade in all deficient musculature to assist with proper gait mechanics.     Baseline  06/28/19: Strength still deficient with functional strength assessment    Time  3    Period  Weeks    Status  On-going    Target Date  06/29/19        PT Long Term Goals - 06/28/19 1501      PT LONG TERM GOAL #1   Title  Patient will perform single limb stance on left/right lower extremity for 3 seconds in order to assist with stair ambulation.    Time  6    Period  Weeks    Status  On-going      PT LONG TERM GOAL #2   Title  Patient will demonstrate improvement of 1 MMT grade in all musculature tested as  deficient at evaluation to improve gait mechanics and safety.    Baseline  06/28/19: Strength still deficient with functional strength assessment    Time  6    Period  Weeks    Status  On-going      PT LONG TERM GOAL #3   Title  Patient will improve ROM for left knee extension/flexion to 0-115 degrees to improve squatting, and other functional mobility.    Baseline  06/28/19: Left knee AROM 1-105 degrees    Time  6    Period  Weeks    Status  Partially Met      PT LONG TERM GOAL #4   Title  Patient will demonstrate ability to perform at least 8 sit to stands on the 30 second chair rise indicating improved balance and functional mobility.    Time  6    Period  Weeks    Status  On-going      PT LONG TERM GOAL #5   Title  Patient will report ability to ambulate for 10 minutes with least restrictive assistive device with no greater than 3/10 pain.    Time  6    Period  Weeks    Status  On-going      PT LONG TERM GOAL #6   Title  Patient will demonstrate ability  to ambulate at a gait velocity of at least 0.8 m/s with LRAD on 2MWT indicating improved ability to ambulate safely household and limited community distances.    Time  6    Period  Weeks    Status  On-going            Plan - 07/13/19 1351    Clinical Impression Statement  Patient continues to be limited by pain with knee flexion and weight bearing activity. Able to progress walking distance, but requires verbal cues for proper weight shift to LLE and for heel to toe transition using SPC. Manual therapy performed to address Lt knee AROM deficits and muscle restrictions in LT quad. Patient did show improvement in LT knee flexion post treatment.    Personal Factors and Comorbidities  Past/Current Experience    Examination-Activity Limitations  Stand;Locomotion Level;Bed Mobility;Bend;Transfers;Stairs;Squat    Examination-Participation Restrictions  Community Activity    Stability/Clinical Decision Making  Stable/Uncomplicated     Rehab Potential  Good    PT Frequency  3x / week   3x/week for 3 weeks and 2x/week for 3 weeks   PT Duration  6 weeks    PT Treatment/Interventions  ADLs/Self Care Home Management;Cryotherapy;Electrical Stimulation;DME Instruction;Gait training;Stair training;Functional mobility training;Therapeutic activities;Moist Heat;Therapeutic exercise;Balance training;Neuromuscular re-education;Patient/family education;Orthotic Fit/Training;Manual techniques;Compression bandaging;Scar mobilization;Passive range of motion;Dry needling;Energy conservation;Taping    PT Next Visit Plan  Continue to progress LT knee AROM as tolerated. Focus on cueing to avoid valgus knee angle with stepping and knee stretch on box to reduce medial knee pain. Progress to SLS if able. Try contract relax technique for improved knee flexion.    PT Home Exercise Plan  Supine heel slides, quad sets, SAQ 10x 1-2 x/day. portal site mobilization, patella mobilization, heel slides with foot on wall Prone quadriceps stretch 3x30'' dail, self massage, butterfly stretch    Consulted and Agree with Plan of Care  Patient       Patient will benefit from skilled therapeutic intervention in order to improve the following deficits and impairments:  Abnormal gait, Increased fascial restricitons, Pain, Decreased mobility, Decreased activity tolerance, Decreased endurance, Decreased range of motion, Decreased strength, Hypomobility, Decreased balance, Difficulty walking, Increased edema, Impaired flexibility  Visit Diagnosis: Stiffness of left knee, not elsewhere classified  Acute pain of left knee  Muscle weakness (generalized)     Problem List Patient Active Problem List   Diagnosis Date Noted  . Iron deficiency anemia 06/20/2019  . S/P left TKA 06/06/2019  . Status post total left knee replacement 06/06/2019  . Special screening for malignant neoplasms, colon 03/28/2019  . Acute diverticulitis 09/26/2018  . LLQ abdominal pain  09/26/2018  . Acute Sigmoid diverticulitis 09/26/2018  . Derangement of anterior horn of lateral meniscus of left knee   . Derangement of posterior horn of medial meniscus of left knee   . Chest pain 04/25/2016  . Hypokalemia 04/25/2016  . Hyperglycemia 04/25/2016  . Diverticulitis of colon 07/17/2014  . Nausea without vomiting 07/17/2014  . Crohn disease ([place]) 07/17/2014  . Diverticulitis of colon without hemorrhage 08/10/2013  . History of arthroscopy of right knee 07/25/2012  . Difficulty in walking(719.7) 06/29/2012  . Weakness of right leg 06/29/2012  . Posterior tibial tendonitis 11/11/2011  . PTTD (posterior tibial tendon dysfunction) 11/11/2011  . Knee pain, right 11/11/2011  . Chest pain 07/16/2011  . Primary osteoarthritis of left knee 02/25/2010  . PAIN IN JOINT, MULTIPLE SITES 02/25/2010  . Essential hypertension 08/13/2009  . HIATAL HERNIA 08/13/2009  .  PALPITATIONS 08/13/2009  . DYSPNEA 08/13/2009   Clarene Critchley PT, DPT 4:07 PM, 07/13/19 [phone]  [place], DPT  [facility] [address] [place], [place], [address] Phone: [phone]   Fax:  [phone]  Name: [name] MRN: [id number] Date of Birth: [birth year]

## 2019-07-18 ENCOUNTER — Encounter (HOSPITAL_COMMUNITY): Payer: Self-pay | Admitting: Physical Therapy

## 2019-07-18 ENCOUNTER — Ambulatory Visit (HOSPITAL_COMMUNITY): Payer: PRIVATE HEALTH INSURANCE | Attending: Family Medicine | Admitting: Physical Therapy

## 2019-07-18 ENCOUNTER — Other Ambulatory Visit: Payer: Self-pay

## 2019-07-18 ENCOUNTER — Encounter (HOSPITAL_COMMUNITY): Payer: 59 | Admitting: Physical Therapy

## 2019-07-18 DIAGNOSIS — R2689 Other abnormalities of gait and mobility: Secondary | ICD-10-CM | POA: Diagnosis present

## 2019-07-18 DIAGNOSIS — M6281 Muscle weakness (generalized): Secondary | ICD-10-CM | POA: Diagnosis present

## 2019-07-18 DIAGNOSIS — M25562 Pain in left knee: Secondary | ICD-10-CM | POA: Diagnosis present

## 2019-07-18 DIAGNOSIS — R29898 Other symptoms and signs involving the musculoskeletal system: Secondary | ICD-10-CM | POA: Diagnosis present

## 2019-07-18 DIAGNOSIS — M25662 Stiffness of left knee, not elsewhere classified: Secondary | ICD-10-CM | POA: Diagnosis present

## 2019-07-18 NOTE — Therapy (Signed)
[place], [place], [address] Phone: [phone]   Fax:  [phone]  Physical Therapy Treatment  Patient Details  Name: [name] MRN: [id number] Date of Birth: 1966/07/08 Referring Provider (PT): Paralee Cancel, MD   Encounter Date: 07/18/2019  PT End of Session - 07/18/19 1605    Visit Number  15    Number of Visits  16    Date for PT Re-Evaluation  07/20/19   Mini re-assess performed 06/28/19   Authorization Type  [employer] (1 eval and 16 treatment sessions approved)    Authorization Time Period  06/08/19 - 07/20/19    Authorization - Visit Number  15    Authorization - Number of Visits  17    PT Start Time  1302    PT Stop Time  1344    PT Time Calculation (min)  42 min    Equipment Utilized During Treatment  Other (comment)   spc   Activity Tolerance  Patient tolerated treatment well    Behavior During Therapy  [facility] for tasks assessed/performed       Past Medical History:  Diagnosis Date  . Anxiety   . Arthritis   . Asthma   . Cervical hyperplasia    hisotry of  . Chest pain    cath 2005 norm cors, repeat cath Feb 2011 normal cors and only minimally  elevated pulmonary pressures  . Crohn disease ([place])   . Diverticulosis   . GERD (gastroesophageal reflux disease)   . Hiatal hernia   . History of iron deficiency anemia   . Hypertension   . Migraines   . Obesity   . Palpitations   . Pneumonia 2008   14 days hospitalization, bilateral  . PONV (postoperative nausea and vomiting)   . Sleep apnea sleep study 11/03/2009    cpap; does sometimes, doesnt use every night. weight loss no longer using CPAP    Past Surgical History:  Procedure Laterality Date  . ABDOMINAL HYSTERECTOMY    . ANKLE ARTHROSCOPY  06/01/2012   Procedure: ANKLE ARTHROSCOPY;  Surgeon: Colin Rhein, MD;  Location: [place];  Service: Orthopedics;  Laterality: Left;  left ankle arthorsocpy with  extensive debridement and gastroc slide  . ANKLE SURGERY Left 05/11/2018  . BIOPSY  04/26/2019   Procedure: BIOPSY;  Surgeon: Rogene Houston, MD;  [facility];  Service: Endoscopy;;  ileum colon  . CARDIAC CATHETERIZATION  11/22/2009 and 2005   WNL  . CESAREAN SECTION    . CHOLECYSTECTOMY  09/08/2006   lap. chole.  . CHONDROPLASTY  06/17/2012   Procedure: CHONDROPLASTY;  Surgeon: Carole Civil, MD;  [facility];  Service: Orthopedics;  Laterality: Right;  right patella  . COLONOSCOPY N/A 04/26/2019   Procedure: COLONOSCOPY;  Surgeon: Rogene Houston, MD;  [facility];  Service: Endoscopy;  Laterality: N/A;  100  . ESOPHAGOGASTRODUODENOSCOPY N/A 05/14/2015   Procedure: ESOPHAGOGASTRODUODENOSCOPY (EGD);  Surgeon: Rogene Houston, MD;  [facility];  Service: Endoscopy;  Laterality: N/A;  730  . GIVENS CAPSULE STUDY N/A 05/24/2019   Procedure: GIVENS CAPSULE STUDY;  Surgeon: Rogene Houston, MD;  [facility];  Service: Endoscopy;  Laterality: N/A;  7:30  . INGUINAL HERNIA REPAIR  10/30/2008   right  . KNEE ARTHROSCOPY Right 2013  . KNEE ARTHROSCOPY WITH LATERAL MENISECTOMY Left 12/23/2016   Procedure: LEFT KNEE ARTHROSCOPY WITH LATERAL MENISECTOMY;  Surgeon: Carole Civil, MD;  Location[facility];  Service: Orthopedics;  Laterality: Left;  . KNEE ARTHROSCOPY WITH MEDIAL MENISECTOMY Left 12/23/2016   Procedure: LEFT KNEE ARTHROSCOPY WITH MEDIAL MENISECTOMY CHONDROPLASTY PATELLA  AND MEDIAL FEMORAL CONDYLE LEFT KNEE;  Surgeon: Carole Civil, MD;  Location: [facility];  Service: Orthopedics;  Laterality: Left;  . SHOULDER ARTHROSCOPY WITH SUBACROMIAL DECOMPRESSION AND BICEP TENDON REPAIR Left 12/21/2017   Procedure: Left shoulder arthroscopic biceps tenodesis, SAD, DCR and labrum debridement;  Surgeon: Nicholes Stairs, MD;  Location: [place];  Service: Orthopedics;  Laterality: Left;  120 mins  . SHOULDER SURGERY     right x 2   . TOTAL  KNEE ARTHROPLASTY Left 06/06/2019   Procedure: TOTAL KNEE ARTHROPLASTY;  Surgeon: Paralee Cancel, MD;  Location: [facility];  Service: Orthopedics;  Laterality: Left;  70 mins    There were no vitals filed for this visit.  Subjective Assessment - 07/18/19 1305    Subjective  Patient says both of her knees were sore after last session. Patient says she was having muscle spasms so she took a few days off and is feeling better.    Pertinent History  Left TKA 06/06/19, see above for further surgical history    Limitations  Standing;Walking;House hold activities    How long can you sit comfortably?  Not limited    How long can you stand comfortably?  5 minutes or less    How long can you walk comfortably?  5 minutes or less    Patient Stated Goals  Improving walking    Currently in Pain?  Yes    Pain Score  3     Pain Location  Knee    Pain Orientation  Left;Anterior;Medial    Pain Descriptors / Indicators  Aching    Pain Onset  1 to 4 weeks ago         [facility] PT Assessment - 07/18/19 0001      AROM   Left Knee Extension  0    Left Knee Flexion  112                   [facility] Adult PT Treatment/Exercise - 07/18/19 0001      Knee/Hip Exercises: Machines for Strengthening   Cybex Leg Press  3 plates, 2 x 10 reps, both, focus on eccentric       Knee/Hip Exercises: Standing   Lateral Step Up  Left;2 sets;10 reps;Step Height: 6"    Step Down  Left;2 sets;10 reps;Step Height: 6"    SLS  LLE;  3 x 15 seconds, level       Knee/Hip Exercises: Seated   Sit to Sand  20 reps   from high mat     Knee/Hip Exercises: Supine   Quad Sets  --    Heel Slides  AAROM;1 set;10 reps      Manual Therapy   Manual Therapy  Soft tissue mobilization;Joint mobilization    Manual therapy comments  All manual completed separately from other skilled interventions    Joint Mobilization  LT patellar mobs in all planes grade II for decreased pain and improved knee AROM     Soft tissue mobilization  STM  to LT distal quad and adductors               PT Short Term Goals - 06/28/19 1500      PT SHORT TERM GOAL #1   Title  Patient will demonstrate understanding  and report regular compliance with HEP to improve knee AROM, lower extremity strength, and overall functional mobility.    Time  3    Period  Weeks    Status  Achieved    Target Date  06/29/19      PT SHORT TERM GOAL #2   Title  Patient will demonstrate left knee extension/flexion active range of motion of at least 5-95 degrees to assist with more normalized gait pattern and stair ambulation.    Time  3    Period  Weeks    Status  Achieved    Target Date  06/29/19      PT SHORT TERM GOAL #3   Title  Patient will demonstrate improvement of 1/2 MMT grade in all deficient musculature to assist with proper gait mechanics.     Baseline  06/28/19: Strength still deficient with functional strength assessment    Time  3    Period  Weeks    Status  On-going    Target Date  06/29/19        PT Long Term Goals - 06/28/19 1501      PT LONG TERM GOAL #1   Title  Patient will perform single limb stance on left/right lower extremity for 3 seconds in order to assist with stair ambulation.    Time  6    Period  Weeks    Status  On-going      PT LONG TERM GOAL #2   Title  Patient will demonstrate improvement of 1 MMT grade in all musculature tested as deficient at evaluation to improve gait mechanics and safety.    Baseline  06/28/19: Strength still deficient with functional strength assessment    Time  6    Period  Weeks    Status  On-going      PT LONG TERM GOAL #3   Title  Patient will improve ROM for left knee extension/flexion to 0-115 degrees to improve squatting, and other functional mobility.    Baseline  06/28/19: Left knee AROM 1-105 degrees    Time  6    Period  Weeks    Status  Partially Met      PT LONG TERM GOAL #4   Title  Patient will demonstrate ability to perform at least 8 sit to stands on the 30 second  chair rise indicating improved balance and functional mobility.    Time  6    Period  Weeks    Status  On-going      PT LONG TERM GOAL #5   Title  Patient will report ability to ambulate for 10 minutes with least restrictive assistive device with no greater than 3/10 pain.    Time  6    Period  Weeks    Status  On-going      PT LONG TERM GOAL #6   Title  Patient will demonstrate ability to ambulate at a gait velocity of at least 0.8 m/s with LRAD on 2MWT indicating improved ability to ambulate safely household and limited community distances.    Time  6    Period  Weeks    Status  On-going            Plan - 07/18/19 1606    Clinical Impression Statement  Patient demos improving LT knee AROM today. Patient continues to be limited in full AROM by pain. Patient progressed to step down forward and lateral on 6 in box without complaint. Patient did well with  added SLS and was able to acheive 15 seconds on LLE with min sway. Added machine leg pressing today, patient educated on proper form and function. Patient required verbal cueing for controlling the weight and avoiding LT knee valgus. Patient reports no increased pain at end of session.    Personal Factors and Comorbidities  Past/Current Experience    Examination-Activity Limitations  Stand;Locomotion Level;Bed Mobility;Bend;Transfers;Stairs;Squat    Examination-Participation Restrictions  Community Activity    Stability/Clinical Decision Making  Stable/Uncomplicated    Rehab Potential  Good    PT Frequency  3x / week   3x/week for 3 weeks and 2x/week for 3 weeks   PT Duration  6 weeks    PT Treatment/Interventions  ADLs/Self Care Home Management;Cryotherapy;Electrical Stimulation;DME Instruction;Gait training;Stair training;Functional mobility training;Therapeutic activities;Moist Heat;Therapeutic exercise;Balance training;Neuromuscular re-education;Patient/family education;Orthotic Fit/Training;Manual techniques;Compression  bandaging;Scar mobilization;Passive range of motion;Dry needling;Energy conservation;Taping    PT Next Visit Plan  Reassessment next visit. Continue to progress knee flexion AROM. Progress LLE strengthening and dynamic balance as tolerated. Add sidestepping next session.    PT Home Exercise Plan  Supine heel slides, quad sets, SAQ 10x 1-2 x/day. portal site mobilization, patella mobilization, heel slides with foot on wall Prone quadriceps stretch 3x30'' dail, self massage, butterfly stretch    Consulted and Agree with Plan of Care  Patient       Patient will benefit from skilled therapeutic intervention in order to improve the following deficits and impairments:  Abnormal gait, Increased fascial restricitons, Pain, Decreased mobility, Decreased activity tolerance, Decreased endurance, Decreased range of motion, Decreased strength, Hypomobility, Decreased balance, Difficulty walking, Increased edema, Impaired flexibility  Visit Diagnosis: Stiffness of left knee, not elsewhere classified  Acute pain of left knee  Muscle weakness (generalized)     Problem List Patient Active Problem List   Diagnosis Date Noted  . Iron deficiency anemia 06/20/2019  . S/P left TKA 06/06/2019  . Status post total left knee replacement 06/06/2019  . Special screening for malignant neoplasms, colon 03/28/2019  . Acute diverticulitis 09/26/2018  . LLQ abdominal pain 09/26/2018  . Acute Sigmoid diverticulitis 09/26/2018  . Derangement of anterior horn of lateral meniscus of left knee   . Derangement of posterior horn of medial meniscus of left knee   . Chest pain 04/25/2016  . Hypokalemia 04/25/2016  . Hyperglycemia 04/25/2016  . Diverticulitis of colon 07/17/2014  . Nausea without vomiting 07/17/2014  . Crohn disease ([place]) 07/17/2014  . Diverticulitis of colon without hemorrhage 08/10/2013  . History of arthroscopy of right knee 07/25/2012  . Difficulty in walking(719.7) 06/29/2012  . Weakness of right  leg 06/29/2012  . Posterior tibial tendonitis 11/11/2011  . PTTD (posterior tibial tendon dysfunction) 11/11/2011  . Knee pain, right 11/11/2011  . Chest pain 07/16/2011  . Primary osteoarthritis of left knee 02/25/2010  . PAIN IN JOINT, MULTIPLE SITES 02/25/2010  . Essential hypertension 08/13/2009  . HIATAL HERNIA 08/13/2009  . PALPITATIONS 08/13/2009  . DYSPNEA 08/13/2009    Elizbeth Squires PT DPT  07/18/2019, 4:17 PM  [facility] [address] [place], [place], [address] Phone: [phone]   Fax:  [phone]  Name: [name] MRN: [id number] Date of Birth: 04-Jun-1966

## 2019-07-19 MED FILL — CYCLOBENZAPRINE HCL 10 MG T: 10 | 10 days supply | Qty: 30 | Fill #0

## 2019-07-20 ENCOUNTER — Encounter (HOSPITAL_COMMUNITY): Payer: Self-pay | Admitting: Physical Therapy

## 2019-07-20 ENCOUNTER — Other Ambulatory Visit: Payer: Self-pay | Admitting: Orthopedic Surgery

## 2019-07-20 ENCOUNTER — Other Ambulatory Visit: Payer: Self-pay

## 2019-07-20 ENCOUNTER — Ambulatory Visit (HOSPITAL_COMMUNITY): Payer: PRIVATE HEALTH INSURANCE | Admitting: Physical Therapy

## 2019-07-20 ENCOUNTER — Encounter (HOSPITAL_COMMUNITY): Payer: 59 | Admitting: Physical Therapy

## 2019-07-20 DIAGNOSIS — M25662 Stiffness of left knee, not elsewhere classified: Secondary | ICD-10-CM

## 2019-07-20 DIAGNOSIS — M6281 Muscle weakness (generalized): Secondary | ICD-10-CM

## 2019-07-20 DIAGNOSIS — M5431 Sciatica, right side: Secondary | ICD-10-CM

## 2019-07-20 DIAGNOSIS — M25562 Pain in left knee: Secondary | ICD-10-CM

## 2019-07-20 MED FILL — LOSARTAN POTASSIUM 100 MG T: 100 | 30 days supply | Qty: 30 | Fill #1

## 2019-07-20 MED FILL — GABAPENTIN 300 MG CAPSULE: 300 | 90 days supply | Qty: 90 | Fill #0

## 2019-07-20 NOTE — Therapy (Signed)
[facility] [address] [place], [place], [address] Phone: [phone]   Fax:  [phone]  Physical Therapy Treatment/ Progress Note  Patient Details  Name: [name] MRN: [id number] Date of Birth: Mar 03, 1966 Referring Provider (PT): Paralee Cancel, MD   Encounter Date: 07/20/2019   Progress Note Reporting Period 06/28/19 to 07/20/19  See note below for Objective Data and Assessment of Progress/Goals.        PT End of Session - 07/20/19 1818    Visit Number  16    Number of Visits  24    Date for PT Re-Evaluation  08/17/19     Authorization Type  [employer] (1 eval and 16 treatment sessions approved; 12 visits approved 07/20/19)    Authorization Time Period  06/08/19 - 07/20/19; 07/20/19-08/17/19    Authorization - Visit Number  16    Authorization - Number of Visits  29    PT Start Time  1310   Patient arrived late   PT Stop Time  1343    PT Time Calculation (min)  33 min    Equipment Utilized During Treatment  Other (comment)   spc   Activity Tolerance  Patient tolerated treatment well    Behavior During Therapy  WFL for tasks assessed/performed       Past Medical History:  Diagnosis Date  . Anxiety   . Arthritis   . Asthma   . Cervical hyperplasia    hisotry of  . Chest pain    cath 2005 norm cors, repeat cath Feb 2011 normal cors and only minimally  elevated pulmonary pressures  . Crohn disease ([place])   . Diverticulosis   . GERD (gastroesophageal reflux disease)   . Hiatal hernia   . History of iron deficiency anemia   . Hypertension   . Migraines   . Obesity   . Palpitations   . Pneumonia 2008   14 days hospitalization, bilateral  . PONV (postoperative nausea and vomiting)   . Sleep apnea sleep study 11/03/2009    cpap; does sometimes, doesnt use every night. weight loss no longer using CPAP    Past Surgical History:  Procedure Laterality Date  . ABDOMINAL HYSTERECTOMY    . ANKLE ARTHROSCOPY   06/01/2012   Procedure: ANKLE ARTHROSCOPY;  Surgeon: Colin Rhein, MD;  Location: [facility];  Service: Orthopedics;  Laterality: Left;  left ankle arthorsocpy with extensive debridement and gastroc slide  . ANKLE SURGERY Left 05/11/2018  . BIOPSY  04/26/2019   Procedure: BIOPSY;  Surgeon: Rogene Houston, MD;  [facility];  Service: Endoscopy;;  ileum colon  . CARDIAC CATHETERIZATION  11/22/2009 and 2005   WNL  . CESAREAN SECTION    . CHOLECYSTECTOMY  09/08/2006   lap. chole.  . CHONDROPLASTY  06/17/2012   Procedure: CHONDROPLASTY;  Surgeon: Carole Civil, MD;  Location: [facility];  Service: Orthopedics;  Laterality: Right;  right patella  . COLONOSCOPY N/A 04/26/2019   Procedure: COLONOSCOPY;  Surgeon: Rogene Houston, MD;  [facility];  Service: Endoscopy;  Laterality: N/A;  100  . ESOPHAGOGASTRODUODENOSCOPY N/A 05/14/2015   Procedure: ESOPHAGOGASTRODUODENOSCOPY (EGD);  Surgeon: Rogene Houston, MD;  [facility];  Service: Endoscopy;  Laterality: N/A;  730  . GIVENS CAPSULE STUDY N/A 05/24/2019   Procedure: GIVENS CAPSULE STUDY;  Surgeon: Rogene Houston, MD;  [facility];  Service: Endoscopy;  Laterality: N/A;  7:30  .  INGUINAL HERNIA REPAIR  10/30/2008   right  . KNEE ARTHROSCOPY Right 2013  . KNEE ARTHROSCOPY WITH LATERAL MENISECTOMY Left 12/23/2016   Procedure: LEFT KNEE ARTHROSCOPY WITH LATERAL MENISECTOMY;  Surgeon: Carole Civil, MD;  Location: [facility];  Service: Orthopedics;  Laterality: Left;  . KNEE ARTHROSCOPY WITH MEDIAL MENISECTOMY Left 12/23/2016   Procedure: LEFT KNEE ARTHROSCOPY WITH MEDIAL MENISECTOMY CHONDROPLASTY PATELLA  AND MEDIAL FEMORAL CONDYLE LEFT KNEE;  Surgeon: Carole Civil, MD;  Location: [facility];  Service: Orthopedics;  Laterality: Left;  . SHOULDER ARTHROSCOPY WITH SUBACROMIAL DECOMPRESSION AND BICEP TENDON REPAIR Left 12/21/2017   Procedure: Left shoulder arthroscopic biceps tenodesis, SAD,  DCR and labrum debridement;  Surgeon: Nicholes Stairs, MD;  Location: [facility];  Service: Orthopedics;  Laterality: Left;  120 mins  . SHOULDER SURGERY     right x 2   . TOTAL KNEE ARTHROPLASTY Left 06/06/2019   Procedure: TOTAL KNEE ARTHROPLASTY;  Surgeon: Paralee Cancel, MD;  Location: [facility];  Service: Orthopedics;  Laterality: Left;  70 mins    There were no vitals filed for this visit.  Subjective Assessment - 07/20/19 1813    Subjective  Patient says knee was sore after last visit from increasing activity. Patient states she took it easy yesterday and is feeling better today. Patient reports she had follow up with MD yesterday and that she is doing well. Patient was instructed to continue therapy for 4 weeks until all goals are met.    Pertinent History  Left TKA 06/06/19, see above for further surgical history    Limitations  Standing;Walking;House hold activities    How long can you sit comfortably?  Not limited    Patient Stated Goals  Improving walking    Currently in Pain?  Yes    Pain Score  2     Pain Location  Knee    Pain Orientation  Left;Anterior;Medial    Pain Descriptors / Indicators  Aching    Pain Onset  1 to 4 weeks ago         [facility] PT Assessment - 07/20/19 0001      Observation/Other Assessments   Focus on Therapeutic Outcomes (FOTO)   60% limited   Was 69%     AROM   Left Knee Extension  1    Left Knee Flexion  115      Strength   Right Hip Extension  4+/5    Right Hip ABduction  5/5    Left Hip Extension  4/5    Left Hip ABduction  4+/5    Right Knee Flexion  5/5    Right Knee Extension  5/5    Left Knee Flexion  4+/5    Left Knee Extension  4+/5    Right Ankle Dorsiflexion  5/5    Left Ankle Dorsiflexion  4+/5      Transfers   Five time sit to stand comments   STS x 8= 45 seconds    no UE assist     Ambulation/Gait   Ambulation/Gait  Yes    Ambulation/Gait Assistance  6: Modified independent (Device/Increase time)    Ambulation Distance  (Feet)  374 Feet    Assistive device  Straight cane    Gait Pattern  Decreased stance time - left;Decreased step length - left    Ambulation Surface  Level    Gait velocity  0.95 m/s    Gait Comments  2MWT      Static Standing Balance  Static Standing Balance -  Activities   Single Leg Stance - Right Leg;Single Leg Stance - Left Leg    Static Standing - Comment/# of Minutes  LT: 24 sec; RT: 14 sec                   [facility] Adult PT Treatment/Exercise - 07/20/19 0001      Knee/Hip Exercises: Supine   Heel Slides  AAROM;1 set;10 reps      Manual Therapy   Manual Therapy  Soft tissue mobilization;Joint mobilization    Manual therapy comments  All manual completed separately from other skilled interventions    Joint Mobilization  LT patellar mobs in all planes grade II for decreased pain and improved knee AROM     Soft tissue mobilization  STM to LT distal and adductors             PT Education - 07/20/19 1815    Education Details  Patient educated on reassessment findings and POC    Person(s) Educated  Patient    Methods  Explanation    Comprehension  Verbalized understanding;Returned demonstration       PT Short Term Goals - 07/20/19 1825      PT SHORT TERM GOAL #1   Title  Patient will demonstrate understanding and report regular compliance with HEP to improve knee AROM, lower extremity strength, and overall functional mobility.    Time  3    Period  Weeks    Status  Achieved    Target Date  06/29/19      PT SHORT TERM GOAL #2   Title  Patient will demonstrate left knee extension/flexion active range of motion of at least 5-95 degrees to assist with more normalized gait pattern and stair ambulation.    Time  3    Period  Weeks    Status  Achieved    Target Date  06/29/19      PT SHORT TERM GOAL #3   Title  Patient will demonstrate improvement of 1/2 MMT grade in all deficient musculature to assist with proper gait mechanics.     Baseline  06/28/19:  Strength still deficient with functional strength assessment    Time  3    Period  Weeks    Status  Achieved    Target Date  06/29/19        PT Long Term Goals - 07/20/19 1826      PT LONG TERM GOAL #1   Title  Patient will perform single limb stance on left/right lower extremity for 3 seconds in order to assist with stair ambulation.    Baseline  07/20/19- L SLS 24 sec;R SLS 14 sec    Time  6    Period  Weeks    Status  Achieved      PT LONG TERM GOAL #2   Title  Patient will demonstrate improvement of 1 MMT grade in all musculature tested as deficient at evaluation to improve gait mechanics and safety.    Baseline  06/28/19: Strength still deficient with functional strength assessment    Time  6    Period  Weeks    Status  Achieved      PT LONG TERM GOAL #3   Title  Patient will improve ROM for left knee extension/flexion to 0-115 degrees to improve squatting, and other functional mobility.    Baseline  06/28/19: Left knee AROM 1-105 degrees; 07/20/19- 1-115 degrees    Time  4  Period  Weeks    Status  Partially Met    Target Date  08/17/19      PT LONG TERM GOAL #4   Title  Patient will demonstrate ability to perform at least 8 sit to stands on the 30 second chair rise indicating improved balance and functional mobility.    Baseline  07/20/19- 45 seconds    Time  4    Period  Weeks    Status  On-going    Target Date  08/17/19      PT LONG TERM GOAL #5   Title  Patient will report ability to ambulate for 10 minutes with least restrictive assistive device with no greater than 3/10 pain.    Time  4    Period  Weeks    Status  On-going    Target Date  08/17/19      PT LONG TERM GOAL #6   Title  Patient will demonstrate ability to ambulate at a gait velocity of at least 0.8 m/s with LRAD on 2MWT indicating improved ability to ambulate safely household and limited community distances.    Baseline  07/20/19- .95 m/s    Time  6    Period  Weeks    Status  Achieved             Plan - 07/20/19 1823    Clinical Impression Statement  Patient is making good progress to LTGs. Patient shows improved LT knee AROM, but is still signifianctly limited due to pain. Patient still using cane for ambulation, and demos mild gait abnormalities, which are negatively impacting functional mobility. Patient will continue to benefit from skilled physical therapy services to address remaining deficits to reduce pain and improve LOF with ADLs and functional mobility tasks.    Personal Factors and Comorbidities  Past/Current Experience    Examination-Activity Limitations  Stand;Locomotion Level;Bed Mobility;Bend;Transfers;Stairs;Squat    Examination-Participation Restrictions  Community Activity    Stability/Clinical Decision Making  Stable/Uncomplicated    Rehab Potential  Good    PT Frequency  2x / week    PT Duration  4 weeks    PT Treatment/Interventions  ADLs/Self Care Home Management;Cryotherapy;Electrical Stimulation;DME Instruction;Gait training;Stair training;Functional mobility training;Therapeutic activities;Moist Heat;Therapeutic exercise;Balance training;Neuromuscular re-education;Patient/family education;Orthotic Fit/Training;Manual techniques;Compression bandaging;Scar mobilization;Passive range of motion;Dry needling;Energy conservation;Taping    PT Next Visit Plan  Continue to progress LT knee AROm as tolerated. Work on normalizing gait, continued quad strengthening as able.    PT Home Exercise Plan  Supine heel slides, quad sets, SAQ 10x 1-2 x/day. portal site mobilization, patella mobilization, heel slides with foot on wall Prone quadriceps stretch 3x30'' dail, self massage, butterfly stretch    Consulted and Agree with Plan of Care  Patient       Patient will benefit from skilled therapeutic intervention in order to improve the following deficits and impairments:  Abnormal gait, Increased fascial restricitons, Pain, Decreased mobility, Decreased activity  tolerance, Decreased endurance, Decreased range of motion, Decreased strength, Hypomobility, Decreased balance, Difficulty walking, Increased edema, Impaired flexibility  Visit Diagnosis: Stiffness of left knee, not elsewhere classified  Acute pain of left knee  Muscle weakness (generalized)     Problem List Patient Active Problem List   Diagnosis Date Noted  . Iron deficiency anemia 06/20/2019  . S/P left TKA 06/06/2019  . Status post total left knee replacement 06/06/2019  . Special screening for malignant neoplasms, colon 03/28/2019  . Acute diverticulitis 09/26/2018  . LLQ abdominal pain 09/26/2018  . Acute Sigmoid  diverticulitis 09/26/2018  . Derangement of anterior horn of lateral meniscus of left knee   . Derangement of posterior horn of medial meniscus of left knee   . Chest pain 04/25/2016  . Hypokalemia 04/25/2016  . Hyperglycemia 04/25/2016  . Diverticulitis of colon 07/17/2014  . Nausea without vomiting 07/17/2014  . Crohn disease ([place]) 07/17/2014  . Diverticulitis of colon without hemorrhage 08/10/2013  . History of arthroscopy of right knee 07/25/2012  . Difficulty in walking(719.7) 06/29/2012  . Weakness of right leg 06/29/2012  . Posterior tibial tendonitis 11/11/2011  . PTTD (posterior tibial tendon dysfunction) 11/11/2011  . Knee pain, right 11/11/2011  . Chest pain 07/16/2011  . Primary osteoarthritis of left knee 02/25/2010  . PAIN IN JOINT, MULTIPLE SITES 02/25/2010  . Essential hypertension 08/13/2009  . HIATAL HERNIA 08/13/2009  . PALPITATIONS 08/13/2009  . DYSPNEA 08/13/2009    Elizbeth Squires PT DPT 07/20/2019, 6:43 PM  [facility] [address] [place], [place], [address] Phone: [phone]   Fax:  [phone]  Name: LAREE GARRON MRN: [id number] Date of Birth: 06/01/1966

## 2019-07-25 ENCOUNTER — Encounter (HOSPITAL_COMMUNITY): Payer: 59

## 2019-07-25 ENCOUNTER — Other Ambulatory Visit: Payer: Self-pay

## 2019-07-25 ENCOUNTER — Encounter (HOSPITAL_COMMUNITY): Payer: Self-pay | Admitting: Physical Therapy

## 2019-07-25 ENCOUNTER — Ambulatory Visit (HOSPITAL_COMMUNITY): Payer: PRIVATE HEALTH INSURANCE | Admitting: Physical Therapy

## 2019-07-25 DIAGNOSIS — M25662 Stiffness of left knee, not elsewhere classified: Secondary | ICD-10-CM

## 2019-07-25 DIAGNOSIS — R2689 Other abnormalities of gait and mobility: Secondary | ICD-10-CM

## 2019-07-25 DIAGNOSIS — R29898 Other symptoms and signs involving the musculoskeletal system: Secondary | ICD-10-CM

## 2019-07-25 DIAGNOSIS — M6281 Muscle weakness (generalized): Secondary | ICD-10-CM

## 2019-07-25 DIAGNOSIS — M25562 Pain in left knee: Secondary | ICD-10-CM

## 2019-07-25 NOTE — Patient Instructions (Signed)
Straight Leg Raise    Tighten stomach and slowly raise locked right leg __12__ inches from floor. Repeat _6___ times per set. Do __2-3__ sets per session. Do _1___ sessions per day.  http://orth.exer.us/1103   Copyright  VHI. All rights reserved.

## 2019-07-25 NOTE — Therapy (Signed)
[place], [place], [address] Phone: [phone]   Fax:  [phone]  Physical Therapy Treatment  Patient Details  Name: [name] MRN: [id number] Date of Birth: 1965/11/14 Referring Provider (PT): Paralee Cancel, MD   Encounter Date: 07/25/2019  PT End of Session - 07/25/19 1404    Visit Number  17    Number of Visits  24    Date for PT Re-Evaluation  08/17/19   Mini re-assess performed 06/28/19   Authorization Type  [employer] (1 eval and 16 treatment sessions approved; 12 visits approved 07/20/19)    Authorization Time Period  06/08/19 - 07/20/19; 07/20/19-08/17/19    Authorization - Visit Number  26    Authorization - Number of Visits  29    PT Start Time  1309   Patient arrived late   PT Stop Time  1350    PT Time Calculation (min)  41 min    Equipment Utilized During Treatment  Other (comment)   spc   Activity Tolerance  Patient tolerated treatment well    Behavior During Therapy  [facility] for tasks assessed/performed       Past Medical History:  Diagnosis Date  . Anxiety   . Arthritis   . Asthma   . Cervical hyperplasia    hisotry of  . Chest pain    cath 2005 norm cors, repeat cath Feb 2011 normal cors and only minimally  elevated pulmonary pressures  . Crohn disease ([place])   . Diverticulosis   . GERD (gastroesophageal reflux disease)   . Hiatal hernia   . History of iron deficiency anemia   . Hypertension   . Migraines   . Obesity   . Palpitations   . Pneumonia 2008   14 days hospitalization, bilateral  . PONV (postoperative nausea and vomiting)   . Sleep apnea sleep study 11/03/2009    cpap; does sometimes, doesnt use every night. weight loss no longer using CPAP    Past Surgical History:  Procedure Laterality Date  . ABDOMINAL HYSTERECTOMY    . ANKLE ARTHROSCOPY  06/01/2012   Procedure: ANKLE ARTHROSCOPY;  Surgeon: Colin Rhein, MD;  Location: [facility];   Service: Orthopedics;  Laterality: Left;  left ankle arthorsocpy with extensive debridement and gastroc slide  . ANKLE SURGERY Left 05/11/2018  . BIOPSY  04/26/2019   Procedure: BIOPSY;  Surgeon: Rogene Houston, MD;  [facility];  Service: Endoscopy;;  ileum colon  . CARDIAC CATHETERIZATION  11/22/2009 and 2005   WNL  . CESAREAN SECTION    . CHOLECYSTECTOMY  09/08/2006   lap. chole.  . CHONDROPLASTY  06/17/2012   Procedure: CHONDROPLASTY;  Surgeon: Carole Civil, MD;  [facility];  Service: Orthopedics;  Laterality: Right;  right patella  . COLONOSCOPY N/A 04/26/2019   Procedure: COLONOSCOPY;  Surgeon: Rogene Houston, MD;  [facility];  Service: Endoscopy;  Laterality: N/A;  100  . ESOPHAGOGASTRODUODENOSCOPY N/A 05/14/2015   Procedure: ESOPHAGOGASTRODUODENOSCOPY (EGD);  Surgeon: Rogene Houston, MD;  [facility];  Service: Endoscopy;  Laterality: N/A;  730  . GIVENS CAPSULE STUDY N/A 05/24/2019   Procedure: GIVENS CAPSULE STUDY;  Surgeon: Rogene Houston, MD;  [facility];  Service: Endoscopy;  Laterality: N/A;  7:30  . INGUINAL HERNIA REPAIR  10/30/2008   right  . KNEE ARTHROSCOPY Right 2013  . KNEE ARTHROSCOPY WITH LATERAL MENISECTOMY Left 12/23/2016  Procedure: LEFT KNEE ARTHROSCOPY WITH LATERAL MENISECTOMY;  Surgeon: Carole Civil, MD;  Location[facility];  Service: Orthopedics;  Laterality: Left;  . KNEE ARTHROSCOPY WITH MEDIAL MENISECTOMY Left 12/23/2016   Procedure: LEFT KNEE ARTHROSCOPY WITH MEDIAL MENISECTOMY CHONDROPLASTY PATELLA  AND MEDIAL FEMORAL CONDYLE LEFT KNEE;  Surgeon: Carole Civil, MD;  Location: [facility];  Service: Orthopedics;  Laterality: Left;  . SHOULDER ARTHROSCOPY WITH SUBACROMIAL DECOMPRESSION AND BICEP TENDON REPAIR Left 12/21/2017   Procedure: Left shoulder arthroscopic biceps tenodesis, SAD, DCR and labrum debridement;  Surgeon: Nicholes Stairs, MD;  Location: [facility];  Service: Orthopedics;   Laterality: Left;  120 mins  . SHOULDER SURGERY     right x 2   . TOTAL KNEE ARTHROPLASTY Left 06/06/2019   Procedure: TOTAL KNEE ARTHROPLASTY;  Surgeon: Paralee Cancel, MD;  Location: [facility];  Service: Orthopedics;  Laterality: Left;  70 mins    There were no vitals filed for this visit.  Subjective Assessment - 07/25/19 1312    Subjective  Patient reported feeling good today. She said she is having a little catch in her side.    Pertinent History  Left TKA 06/06/19, see above for further surgical history    Limitations  Standing;Walking;House hold activities    How long can you sit comfortably?  Not limited    Patient Stated Goals  Improving walking    Currently in Pain?  Yes    Pain Score  2     Pain Location  Knee    Pain Orientation  Left    Pain Descriptors / Indicators  Aching    Pain Onset  1 to 4 weeks ago                       [facility] Adult PT Treatment/Exercise - 07/25/19 0001      Ambulation/Gait   Ambulation/Gait  Yes    Ambulation/Gait Assistance  6: Modified independent (Device/Increase time)    Ambulation Distance (Feet)  226 Feet    Gait Pattern  Decreased stance time - left;Decreased step length - right      Knee/Hip Exercises: Stretches   Knee: Self-Stretch to increase Flexion  Left;10 seconds   on 12 inch box     Knee/Hip Exercises: Standing   Lateral Step Up  Left;2 sets;10 reps;Step Height: 6"    Forward Step Up  Left;10 reps;Hand Hold: 1;Step Height: 6";1 set      Knee/Hip Exercises: Seated   Long Arc Quad  AROM;Strengthening;Left;1 set;10 reps    Long Arc Quad Weight  --   5'' holds     Knee/Hip Exercises: Supine   Straight Leg Raises  Strengthening;Left;2 sets    Straight Leg Raises Limitations  6 repetitions    Knee Extension  AROM    Knee Extension Limitations  1    Knee Flexion  AROM    Knee Flexion Limitations  116      Knee/Hip Exercises: Prone   Hamstring Curl  10 reps    Hamstring Curl Limitations  Left      Manual  Therapy   Manual Therapy  Soft tissue mobilization;Joint mobilization;Muscle Energy Technique    Manual therapy comments  All manual completed separately from other skilled interventions    Joint Mobilization  LT patellar mobs in all planes grade II for decreased pain and improved knee AROM     Soft tissue mobilization  STM to LT distal quadriceps, adductors, and gastrocnemius  Muscle Energy Technique  Contract/relax patient in prone 5x 10'' contract 20'' relax to improve knee flexion             PT Education - 07/25/19 1407    Education Details  Discussed purpose and technique of interventions this session.    Person(s) Educated  Patient    Methods  Explanation    Comprehension  Verbalized understanding       PT Short Term Goals - 07/20/19 1825      PT SHORT TERM GOAL #1   Title  Patient will demonstrate understanding and report regular compliance with HEP to improve knee AROM, lower extremity strength, and overall functional mobility.    Time  3    Period  Weeks    Status  Achieved    Target Date  06/29/19      PT SHORT TERM GOAL #2   Title  Patient will demonstrate left knee extension/flexion active range of motion of at least 5-95 degrees to assist with more normalized gait pattern and stair ambulation.    Time  3    Period  Weeks    Status  Achieved    Target Date  06/29/19      PT SHORT TERM GOAL #3   Title  Patient will demonstrate improvement of 1/2 MMT grade in all deficient musculature to assist with proper gait mechanics.     Baseline  06/28/19: Strength still deficient with functional strength assessment    Time  3    Period  Weeks    Status  Achieved    Target Date  06/29/19        PT Long Term Goals - 07/20/19 1826      PT LONG TERM GOAL #1   Title  Patient will perform single limb stance on left/right lower extremity for 3 seconds in order to assist with stair ambulation.    Baseline  07/20/19- L SLS 24 sec;R SLS 14 sec    Time  6    Period   Weeks    Status  Achieved      PT LONG TERM GOAL #2   Title  Patient will demonstrate improvement of 1 MMT grade in all musculature tested as deficient at evaluation to improve gait mechanics and safety.    Baseline  06/28/19: Strength still deficient with functional strength assessment    Time  6    Period  Weeks    Status  Achieved      PT LONG TERM GOAL #3   Title  Patient will improve ROM for left knee extension/flexion to 0-115 degrees to improve squatting, and other functional mobility.    Baseline  06/28/19: Left knee AROM 1-105 degrees; 07/20/19- 1-115 degrees    Time  4    Period  Weeks    Status  Partially Met    Target Date  08/17/19      PT LONG TERM GOAL #4   Title  Patient will demonstrate ability to perform at least 8 sit to stands on the 30 second chair rise indicating improved balance and functional mobility.    Baseline  07/20/19- 45 seconds    Time  4    Period  Weeks    Status  On-going    Target Date  08/17/19      PT LONG TERM GOAL #5   Title  Patient will report ability to ambulate for 10 minutes with least restrictive assistive device with no greater than 3/10 pain.  Time  4    Period  Weeks    Status  On-going    Target Date  08/17/19      PT LONG TERM GOAL #6   Title  Patient will demonstrate ability to ambulate at a gait velocity of at least 0.8 m/s with LRAD on 2MWT indicating improved ability to ambulate safely household and limited community distances.    Baseline  07/20/19- .95 m/s    Time  6    Period  Weeks    Status  Achieved            Plan - 07/25/19 1405    Clinical Impression Statement  This session focused on quadriceps strengthening and improving left knee flexion ROM. Added straight leg raises to patient's HEP this session. With lateral step ups, cued patient to use leg muscles and not to lean with trunk, which patient demonstrated improvement of and understanding. Also added contract relax to POC to improve patient's left knee  flexion ROM with an improvement to 116 degrees of left knee flexion following this.    Personal Factors and Comorbidities  Past/Current Experience    Examination-Activity Limitations  Stand;Locomotion Level;Bed Mobility;Bend;Transfers;Stairs;Squat    Examination-Participation Restrictions  Community Activity    Stability/Clinical Decision Making  Stable/Uncomplicated    Rehab Potential  Good    PT Frequency  2x / week    PT Duration  4 weeks    PT Treatment/Interventions  ADLs/Self Care Home Management;Cryotherapy;Electrical Stimulation;DME Instruction;Gait training;Stair training;Functional mobility training;Therapeutic activities;Moist Heat;Therapeutic exercise;Balance training;Neuromuscular re-education;Patient/family education;Orthotic Fit/Training;Manual techniques;Compression bandaging;Scar mobilization;Passive range of motion;Dry needling;Energy conservation;Taping    PT Next Visit Plan  Continue to progress LT knee AROm as tolerated. Work on normalizing gait, continued quad strengthening as able.    PT Home Exercise Plan  Supine heel slides, quad sets, SAQ 10x 1-2 x/day. portal site mobilization, patella mobilization, heel slides with foot on wall Prone quadriceps stretch 3x30'' dail, self massage, butterfly stretch; 07/25/19: SLR 2-3x6 repetitions 1x/day    Consulted and Agree with Plan of Care  Patient       Patient will benefit from skilled therapeutic intervention in order to improve the following deficits and impairments:  Abnormal gait, Increased fascial restricitons, Pain, Decreased mobility, Decreased activity tolerance, Decreased endurance, Decreased range of motion, Decreased strength, Hypomobility, Decreased balance, Difficulty walking, Increased edema, Impaired flexibility  Visit Diagnosis: Stiffness of left knee, not elsewhere classified  Acute pain of left knee  Muscle weakness (generalized)  Other symptoms and signs involving the musculoskeletal system  Other  abnormalities of gait and mobility     Problem List Patient Active Problem List   Diagnosis Date Noted  . Iron deficiency anemia 06/20/2019  . S/P left TKA 06/06/2019  . Status post total left knee replacement 06/06/2019  . Special screening for malignant neoplasms, colon 03/28/2019  . Acute diverticulitis 09/26/2018  . LLQ abdominal pain 09/26/2018  . Acute Sigmoid diverticulitis 09/26/2018  . Derangement of anterior horn of lateral meniscus of left knee   . Derangement of posterior horn of medial meniscus of left knee   . Chest pain 04/25/2016  . Hypokalemia 04/25/2016  . Hyperglycemia 04/25/2016  . Diverticulitis of colon 07/17/2014  . Nausea without vomiting 07/17/2014  . Crohn disease ([place]) 07/17/2014  . Diverticulitis of colon without hemorrhage 08/10/2013  . History of arthroscopy of right knee 07/25/2012  . Difficulty in walking(719.7) 06/29/2012  . Weakness of right leg 06/29/2012  . Posterior tibial tendonitis 11/11/2011  . PTTD (posterior  tibial tendon dysfunction) 11/11/2011  . Knee pain, right 11/11/2011  . Chest pain 07/16/2011  . Primary osteoarthritis of left knee 02/25/2010  . PAIN IN JOINT, MULTIPLE SITES 02/25/2010  . Essential hypertension 08/13/2009  . HIATAL HERNIA 08/13/2009  . PALPITATIONS 08/13/2009  . DYSPNEA 08/13/2009   Clarene Critchley PT, DPT 2:14 PM, 07/25/19 [place], [place], [address] Phone: [phone]   Fax:  [phone]  Name: [name] MRN: [id number] Date of Birth: Jun 21, 1966

## 2019-07-27 ENCOUNTER — Ambulatory Visit (HOSPITAL_COMMUNITY): Payer: PRIVATE HEALTH INSURANCE | Admitting: Physical Therapy

## 2019-07-27 ENCOUNTER — Encounter (HOSPITAL_COMMUNITY): Payer: 59

## 2019-07-27 ENCOUNTER — Other Ambulatory Visit: Payer: Self-pay

## 2019-07-27 ENCOUNTER — Encounter (HOSPITAL_COMMUNITY): Payer: Self-pay | Admitting: Physical Therapy

## 2019-07-27 DIAGNOSIS — R2689 Other abnormalities of gait and mobility: Secondary | ICD-10-CM

## 2019-07-27 DIAGNOSIS — M25662 Stiffness of left knee, not elsewhere classified: Secondary | ICD-10-CM

## 2019-07-27 DIAGNOSIS — R29898 Other symptoms and signs involving the musculoskeletal system: Secondary | ICD-10-CM

## 2019-07-27 DIAGNOSIS — M6281 Muscle weakness (generalized): Secondary | ICD-10-CM

## 2019-07-27 DIAGNOSIS — M25562 Pain in left knee: Secondary | ICD-10-CM

## 2019-07-27 NOTE — Therapy (Signed)
[facility] [place], [place], [address] Phone: [phone]   Fax:  [phone]  Physical Therapy Treatment  Patient Details  Name: [name] MRN: [id number] Date of Birth: 30-Jun-1966 Referring Provider (PT): Paralee Cancel, MD   Encounter Date: 07/27/2019  PT End of Session - 07/27/19 1153    Visit Number  18    Number of Visits  24    Date for PT Re-Evaluation  08/17/19   Mini re-assess performed 06/28/19   Authorization Type  [employer] (1 eval and 16 treatment sessions approved; 12 visits approved 07/20/19)    Authorization Time Period  06/08/19 - 07/20/19; 07/20/19-08/17/19    Authorization - Visit Number  18    Authorization - Number of Visits  29    PT Start Time  5916    PT Stop Time  1117    PT Time Calculation (min)  39 min    Equipment Utilized During Treatment  Other (comment)   spc   Activity Tolerance  Patient tolerated treatment well    Behavior During Therapy  WFL for tasks assessed/performed       Past Medical History:  Diagnosis Date  . Anxiety   . Arthritis   . Asthma   . Cervical hyperplasia    hisotry of  . Chest pain    cath 2005 norm cors, repeat cath Feb 2011 normal cors and only minimally  elevated pulmonary pressures  . Crohn disease ([place])   . Diverticulosis   . GERD (gastroesophageal reflux disease)   . Hiatal hernia   . History of iron deficiency anemia   . Hypertension   . Migraines   . Obesity   . Palpitations   . Pneumonia 2008   14 days hospitalization, bilateral  . PONV (postoperative nausea and vomiting)   . Sleep apnea sleep study 11/03/2009    cpap; does sometimes, doesnt use every night. weight loss no longer using CPAP    Past Surgical History:  Procedure Laterality Date  . ABDOMINAL HYSTERECTOMY    . ANKLE ARTHROSCOPY  06/01/2012   Procedure: ANKLE ARTHROSCOPY;  Surgeon: Colin Rhein, MD;  Location: [facility];  Service: Orthopedics;   Laterality: Left;  left ankle arthorsocpy with extensive debridement and gastroc slide  . ANKLE SURGERY Left 05/11/2018  . BIOPSY  04/26/2019   Procedure: BIOPSY;  Surgeon: Rogene Houston, MD;  [facility];  Service: Endoscopy;;  ileum colon  . CARDIAC CATHETERIZATION  11/22/2009 and 2005   WNL  . CESAREAN SECTION    . CHOLECYSTECTOMY  09/08/2006   lap. chole.  . CHONDROPLASTY  06/17/2012   Procedure: CHONDROPLASTY;  Surgeon: Carole Civil, MD;  [facility];  Service: Orthopedics;  Laterality: Right;  right patella  . COLONOSCOPY N/A 04/26/2019   Procedure: COLONOSCOPY;  Surgeon: Rogene Houston, MD;  [facility];  Service: Endoscopy;  Laterality: N/A;  100  . ESOPHAGOGASTRODUODENOSCOPY N/A 05/14/2015   Procedure: ESOPHAGOGASTRODUODENOSCOPY (EGD);  Surgeon: Rogene Houston, MD;  [facility];  Service: Endoscopy;  Laterality: N/A;  730  . GIVENS CAPSULE STUDY N/A 05/24/2019   Procedure: GIVENS CAPSULE STUDY;  Surgeon: Rogene Houston, MD;  [facility];  Service: Endoscopy;  Laterality: N/A;  7:30  . INGUINAL HERNIA REPAIR  10/30/2008   right  . KNEE ARTHROSCOPY Right 2013  . KNEE ARTHROSCOPY WITH LATERAL MENISECTOMY Left 12/23/2016   Procedure: LEFT KNEE  ARTHROSCOPY WITH LATERAL MENISECTOMY;  Surgeon: Carole Civil, MD;  [facility];  Service: Orthopedics;  Laterality: Left;  . KNEE ARTHROSCOPY WITH MEDIAL MENISECTOMY Left 12/23/2016   Procedure: LEFT KNEE ARTHROSCOPY WITH MEDIAL MENISECTOMY CHONDROPLASTY PATELLA  AND MEDIAL FEMORAL CONDYLE LEFT KNEE;  Surgeon: Carole Civil, MD;  [facility];  Service: Orthopedics;  Laterality: Left;  . SHOULDER ARTHROSCOPY WITH SUBACROMIAL DECOMPRESSION AND BICEP TENDON REPAIR Left 12/21/2017   Procedure: Left shoulder arthroscopic biceps tenodesis, SAD, DCR and labrum debridement;  Surgeon: Nicholes Stairs, MD;  Location: [facility];  Service: Orthopedics;  Laterality: Left;  120 mins   . SHOULDER SURGERY     right x 2   . TOTAL KNEE ARTHROPLASTY Left 06/06/2019   Procedure: TOTAL KNEE ARTHROPLASTY;  Surgeon: Paralee Cancel, MD;  Location: [facility];  Service: Orthopedics;  Laterality: Left;  70 mins    There were no vitals filed for this visit.  Subjective Assessment - 07/27/19 1055    Subjective  Patient says her knee is good today, just a little stiff because its morning. Patient states her knee feels a little "catchy this AM because I haven't warmed up yet".    Pertinent History  Left TKA 06/06/19, see above for further surgical history    Limitations  Standing;Walking;House hold activities    How long can you sit comfortably?  Not limited    Patient Stated Goals  Improving walking    Currently in Pain?  Yes    Pain Score  2     Pain Location  Knee    Pain Orientation  Left;Medial    Pain Descriptors / Indicators  Aching    Pain Onset  1 to 4 weeks ago                       [facility] Adult PT Treatment/Exercise - 07/27/19 0001      Knee/Hip Exercises: Standing   Hip Abduction  Left;15 reps    Hip Extension  Left;15 reps    Lateral Step Up  Left;10 reps;Step Height: 6"    Forward Step Up  Left;10 reps;Step Height: 6"    Step Down  Left;10 reps;Step Height: 6"    SLS  LLE;  3 x 15 seconds, foam pad    Gait Training  Clinic ambulation with no AD (239f)      Knee/Hip Exercises: Supine   Quad Sets  Left;10 reps    Heel Slides  AAROM;1 set;10 reps    Heel Slides Limitations  using gait belt    Straight Leg Raises  Left;10 reps    Knee Extension  AROM    Knee Extension Limitations  1    Knee Flexion  AROM    Knee Flexion Limitations  113      Manual Therapy   Manual Therapy  Soft tissue mobilization;Joint mobilization   Manual therapy comments  All manual completed separately from other skilled interventions    Joint Mobilization  LT patellar mobs in all planes grade II for decreased pain and improved knee AROM     Soft tissue mobilization  STM to  LT distal quadriceps, adductors               PT Short Term Goals - 07/20/19 1825      PT SHORT TERM GOAL #1   Title  Patient will demonstrate understanding and report regular compliance with HEP to improve knee AROM, lower extremity strength, and overall  functional mobility.    Time  3    Period  Weeks    Status  Achieved    Target Date  06/29/19      PT SHORT TERM GOAL #2   Title  Patient will demonstrate left knee extension/flexion active range of motion of at least 5-95 degrees to assist with more normalized gait pattern and stair ambulation.    Time  3    Period  Weeks    Status  Achieved    Target Date  06/29/19      PT SHORT TERM GOAL #3   Title  Patient will demonstrate improvement of 1/2 MMT grade in all deficient musculature to assist with proper gait mechanics.     Baseline  06/28/19: Strength still deficient with functional strength assessment    Time  3    Period  Weeks    Status  Achieved    Target Date  06/29/19        PT Long Term Goals - 07/20/19 1826      PT LONG TERM GOAL #1   Title  Patient will perform single limb stance on left/right lower extremity for 3 seconds in order to assist with stair ambulation.    Baseline  07/20/19- L SLS 24 sec;R SLS 14 sec    Time  6    Period  Weeks    Status  Achieved      PT LONG TERM GOAL #2   Title  Patient will demonstrate improvement of 1 MMT grade in all musculature tested as deficient at evaluation to improve gait mechanics and safety.    Baseline  06/28/19: Strength still deficient with functional strength assessment    Time  6    Period  Weeks    Status  Achieved      PT LONG TERM GOAL #3   Title  Patient will improve ROM for left knee extension/flexion to 0-115 degrees to improve squatting, and other functional mobility.    Baseline  06/28/19: Left knee AROM 1-105 degrees; 07/20/19- 1-115 degrees    Time  4    Period  Weeks    Status  Partially Met    Target Date  08/17/19      PT LONG TERM  GOAL #4   Title  Patient will demonstrate ability to perform at least 8 sit to stands on the 30 second chair rise indicating improved balance and functional mobility.    Baseline  07/20/19- 45 seconds    Time  4    Period  Weeks    Status  On-going    Target Date  08/17/19      PT LONG TERM GOAL #5   Title  Patient will report ability to ambulate for 10 minutes with least restrictive assistive device with no greater than 3/10 pain.    Time  4    Period  Weeks    Status  On-going    Target Date  08/17/19      PT LONG TERM GOAL #6   Title  Patient will demonstrate ability to ambulate at a gait velocity of at least 0.8 m/s with LRAD on 2MWT indicating improved ability to ambulate safely household and limited community distances.    Baseline  07/20/19- .95 m/s    Time  6    Period  Weeks    Status  Achieved            Plan - 07/27/19 1154    Clinical  Impression Statement  Patient tolerated session well today. Patient continues to have restriciton due to LT knee AROM limitation, but is slowly progressing. Added standing hip abduction and extension in standing today to progress LLE stability in weightbearing. Patient also progressed to SLS on foam pad, which was challenging, but patient showed good tolerance. Practiced gait training with no AD, patient showed improved carry over for heel to toe transition with no AD, but demos decreased stance time on LLE. Will continue to progress toward normalized gait as able.    Personal Factors and Comorbidities  Past/Current Experience    Examination-Activity Limitations  Stand;Locomotion Level;Bed Mobility;Bend;Transfers;Stairs;Squat    Examination-Participation Restrictions  Community Activity    Stability/Clinical Decision Making  Stable/Uncomplicated    Rehab Potential  Good    PT Frequency  2x / week    PT Duration  4 weeks    PT Treatment/Interventions  ADLs/Self Care Home Management;Cryotherapy;Electrical Stimulation;DME Instruction;Gait  training;Stair training;Functional mobility training;Therapeutic activities;Moist Heat;Therapeutic exercise;Balance training;Neuromuscular re-education;Patient/family education;Orthotic Fit/Training;Manual techniques;Compression bandaging;Scar mobilization;Passive range of motion;Dry needling;Energy conservation;Taping    PT Next Visit Plan  Continue to progress quad and hip strength. Progress gait training with no AD    PT Home Exercise Plan  Supine heel slides, quad sets, SAQ 10x 1-2 x/day. portal site mobilization, patella mobilization, heel slides with foot on wall Prone quadriceps stretch 3x30'' dail, self massage, butterfly stretch; 07/25/19: SLR 2-3x6 repetitions 1x/day    Consulted and Agree with Plan of Care  Patient       Patient will benefit from skilled therapeutic intervention in order to improve the following deficits and impairments:  Abnormal gait, Increased fascial restricitons, Pain, Decreased mobility, Decreased activity tolerance, Decreased endurance, Decreased range of motion, Decreased strength, Hypomobility, Decreased balance, Difficulty walking, Increased edema, Impaired flexibility  Visit Diagnosis: Stiffness of left knee, not elsewhere classified  Acute pain of left knee  Muscle weakness (generalized)  Other symptoms and signs involving the musculoskeletal system  Other abnormalities of gait and mobility     Problem List Patient Active Problem List   Diagnosis Date Noted  . Iron deficiency anemia 06/20/2019  . S/P left TKA 06/06/2019  . Status post total left knee replacement 06/06/2019  . Special screening for malignant neoplasms, colon 03/28/2019  . Acute diverticulitis 09/26/2018  . LLQ abdominal pain 09/26/2018  . Acute Sigmoid diverticulitis 09/26/2018  . Derangement of anterior horn of lateral meniscus of left knee   . Derangement of posterior horn of medial meniscus of left knee   . Chest pain 04/25/2016  . Hypokalemia 04/25/2016  . Hyperglycemia  04/25/2016  . Diverticulitis of colon 07/17/2014  . Nausea without vomiting 07/17/2014  . Crohn disease ([place]) 07/17/2014  . Diverticulitis of colon without hemorrhage 08/10/2013  . History of arthroscopy of right knee 07/25/2012  . Difficulty in walking(719.7) 06/29/2012  . Weakness of right leg 06/29/2012  . Posterior tibial tendonitis 11/11/2011  . PTTD (posterior tibial tendon dysfunction) 11/11/2011  . Knee pain, right 11/11/2011  . Chest pain 07/16/2011  . Primary osteoarthritis of left knee 02/25/2010  . PAIN IN JOINT, MULTIPLE SITES 02/25/2010  . Essential hypertension 08/13/2009  . HIATAL HERNIA 08/13/2009  . PALPITATIONS 08/13/2009  . DYSPNEA 08/13/2009    Elizbeth Squires PT DPT 07/27/2019, 12:01 PM  [facility] [address] [place], [place], [address] Phone: [phone]   Fax:  [phone]  Name: CHARNITA TRUDEL MRN: [id number] Date of Birth: 1965/11/14

## 2019-08-01 ENCOUNTER — Telehealth (INDEPENDENT_AMBULATORY_CARE_PROVIDER_SITE_OTHER): Payer: Self-pay | Admitting: *Deleted

## 2019-08-01 ENCOUNTER — Telehealth (HOSPITAL_COMMUNITY): Payer: Self-pay | Admitting: Physical Therapy

## 2019-08-01 ENCOUNTER — Ambulatory Visit (HOSPITAL_COMMUNITY): Payer: 59 | Admitting: Physical Therapy

## 2019-08-01 ENCOUNTER — Ambulatory Visit (INDEPENDENT_AMBULATORY_CARE_PROVIDER_SITE_OTHER): Payer: 59 | Admitting: Nurse Practitioner

## 2019-08-01 ENCOUNTER — Other Ambulatory Visit: Payer: Self-pay

## 2019-08-01 ENCOUNTER — Encounter (HOSPITAL_COMMUNITY): Payer: 59 | Admitting: Physical Therapy

## 2019-08-01 ENCOUNTER — Encounter (INDEPENDENT_AMBULATORY_CARE_PROVIDER_SITE_OTHER): Payer: Self-pay | Admitting: Nurse Practitioner

## 2019-08-01 ENCOUNTER — Encounter (INDEPENDENT_AMBULATORY_CARE_PROVIDER_SITE_OTHER): Payer: Self-pay | Admitting: *Deleted

## 2019-08-01 VITALS — BP 146/80 | HR 98 | Temp 98.5°F | Ht 71.0 in | Wt 234.2 lb

## 2019-08-01 DIAGNOSIS — K5732 Diverticulitis of large intestine without perforation or abscess without bleeding: Secondary | ICD-10-CM | POA: Diagnosis not present

## 2019-08-01 DIAGNOSIS — R1030 Lower abdominal pain, unspecified: Secondary | ICD-10-CM

## 2019-08-01 DIAGNOSIS — K5000 Crohn's disease of small intestine without complications: Secondary | ICD-10-CM

## 2019-08-01 NOTE — Telephone Encounter (Signed)
pt is sick with a fever from her diverticulitis

## 2019-08-01 NOTE — Patient Instructions (Signed)
1.  Complete the provided lab order now  2.  Stat abdominal/pelvic CAT scan with oral contrast only  3.  If your abdominal pain worsens presented to [facility] emergency room  4.  Okay to start Cipro 500 mg 1 tablet by mouth twice daily and Flagyl 500 mg 1 tab by mouth twice daily for 10 days.  Further antibiotic instruction will be provided after the abdominal/pelvic CAT scan results have been reviewed.  Do not start Budesonide at this time.  5.  Push fluids, bland diet for now

## 2019-08-01 NOTE — Telephone Encounter (Signed)
   Diagnosis:    Result(s)   Card 1 Negative:        Completed by: Thomas Hoff , LPN   HEMOCCULT SENSA DEVELOPER: LOT#:  [id number] EXPIRATION DATE: 2022-06   HEMOCCULT SENSA CARD:  LOT#:  [id number] EXPIRATION DATE: 08/21   CARD CONTROL RESULTS:  POSITIVE: Positive NEGATIVE: Negative    ADDITIONAL COMMENTS: Patient has appointment with NP , will give her the result. Dr.Rehman was made aware.

## 2019-08-01 NOTE — Progress Notes (Signed)
Subjective:    Patient ID: [name], female    DOB: [birth year], 53 y.o.   MRN: [id number]  HPI patient is a 53 year old African-American female with a past medical history of ileal Crohn's disease initially diagnosed in 2011.  As previously reviewed, she is status post left total knee replacement surgery 06/06/2019.  She was found to have postoperative iron deficiency anemia and she was placed on ferrous sulfate 325 mg 3 times daily.  She was  last seen in our office 06/20/2019 with concerns of having black stool.  Her stool was guaiac negative. Her stools were black in the setting of taking oral iron.  At the time of this visit, she stated she started Budesonide 9 mg daily as ordered by Dr. Dorien Chihuahua for her Crohn's disease.  However, today she said she never started this medication.  She was concerned the Budesonide would cause worsening GI symptoms in the setting of taking Celebrex.  This was previously discussed with Dr. Dorien Chihuahua and he put her on Protonix 40 mg twice daily and famotidine 40 mg twice daily while she took the Celebrex during her postoperative period.  She is no longer taking Celebrex and she stopped taking Protonix and Famotidine.  She denies having any reflux symptoms. She presents today for further evaluation regarding lower abdominal pain.  She reported feeling poorly on Sunday, 07/30/2019.  She developed central lower and left lower quadrant abdominal pain.  Her appetite is decreased and she feels full easily.  She reported having severe lower abdominal pain yesterday.  She went on a liquid diet.  She reported her temperature was 99.0 in the morning up to 100.2 later in the evening. She took Tylenol and her temperature has been normal since then.  She has a prior history of diverticulitis 09/2018 (confirmed by abd/pelvic CT) and she was concerned that she was having a diverticulitis flare and not a Crohn's disease flare.  He is passing multiple soft bowel movements daily.  She sometimes  passes nonbloody mucus.  No rectal bleeding or melena.  Her abdominal pain is worse when she is driving over bumps in the road.   Her most recent colonoscopy was 04/26/2019 identified a few erosions in the terminal ileum, 2 erosions in the sigmoid, diverticulosis to the sigmoid, hepatic flexure and a sending colon with external hemorrhoids.   Past Medical History:  Diagnosis Date   Anxiety    Arthritis    Asthma    Cervical hyperplasia    hisotry of   Chest pain    cath 2005 norm cors, repeat cath Feb 2011 normal cors and only minimally  elevated pulmonary pressures   Crohn disease ([place])    Diverticulosis    GERD (gastroesophageal reflux disease)    Hiatal hernia    History of iron deficiency anemia    Hypertension    Migraines    Obesity    Palpitations    Pneumonia 2008   14 days hospitalization, bilateral   PONV (postoperative nausea and vomiting)    Sleep apnea sleep study 11/03/2009    cpap; does sometimes, doesnt use every night. weight loss no longer using CPAP   Past Surgical History:  Procedure Laterality Date   ABDOMINAL HYSTERECTOMY     ANKLE ARTHROSCOPY  06/01/2012   Procedure: ANKLE ARTHROSCOPY;  Surgeon: Colin Rhein, MD;  Location: [facility];  Service: Orthopedics;  Laterality: Left;  left ankle arthorsocpy with extensive debridement and gastroc slide   ANKLE SURGERY Left  05/11/2018   BIOPSY  04/26/2019   Procedure: BIOPSY;  Surgeon: Rogene Houston, MD;  [facility];  Service: Endoscopy;;  ileum colon   CARDIAC CATHETERIZATION  11/22/2009 and 2005   WNL   CESAREAN SECTION     CHOLECYSTECTOMY  09/08/2006   lap. chole.   CHONDROPLASTY  06/17/2012   Procedure: CHONDROPLASTY;  Surgeon: Carole Civil, MD;  [facility];  Service: Orthopedics;  Laterality: Right;  right patella   COLONOSCOPY N/A 04/26/2019   Procedure: COLONOSCOPY;  Surgeon: Rogene Houston, MD;  [facility];  Service:  Endoscopy;  Laterality: N/A;  100   ESOPHAGOGASTRODUODENOSCOPY N/A 05/14/2015   Procedure: ESOPHAGOGASTRODUODENOSCOPY (EGD);  Surgeon: Rogene Houston, MD;  [facility];  Service: Endoscopy;  Laterality: N/A;  [place] N/A 05/24/2019   Procedure: GIVENS CAPSULE STUDY;  Surgeon: Rogene Houston, MD;  [facility];  Service: Endoscopy;  Laterality: N/A;  7:30   INGUINAL HERNIA REPAIR  10/30/2008   right   KNEE ARTHROSCOPY Right 2013   KNEE ARTHROSCOPY WITH LATERAL MENISECTOMY Left 12/23/2016   Procedure: LEFT KNEE ARTHROSCOPY WITH LATERAL MENISECTOMY;  Surgeon: Carole Civil, MD;  [facility];  Service: Orthopedics;  Laterality: Left;   KNEE ARTHROSCOPY WITH MEDIAL MENISECTOMY Left 12/23/2016   Procedure: LEFT KNEE ARTHROSCOPY WITH MEDIAL MENISECTOMY CHONDROPLASTY PATELLA  AND MEDIAL FEMORAL CONDYLE LEFT KNEE;  Surgeon: Carole Civil, MD;  [facility];  Service: Orthopedics;  Laterality: Left;   SHOULDER ARTHROSCOPY WITH SUBACROMIAL DECOMPRESSION AND BICEP TENDON REPAIR Left 12/21/2017   Procedure: Left shoulder arthroscopic biceps tenodesis, SAD, DCR and labrum debridement;  Surgeon: Nicholes Stairs, MD;  Location: [facility];  Service: Orthopedics;  Laterality: Left;  120 mins   SHOULDER SURGERY     right x 2    TOTAL KNEE ARTHROPLASTY Left 06/06/2019   Procedure: TOTAL KNEE ARTHROPLASTY;  Surgeon: Paralee Cancel, MD;  [facility];  Service: Orthopedics;  Laterality: Left;  70 mins   Current Outpatient Medications on File Prior to Visit  Medication Sig Dispense Refill   acetaminophen (TYLENOL) 500 MG tablet Take 2 tablets (1,000 mg total) by mouth every 8 (eight) hours. 30 tablet 0   albuterol (PROVENTIL) (2.5 MG/3ML) 0.083% nebulizer solution Take 2.5 mg by nebulization every 6 (six) hours as needed. For shortness of breath     albuterol (VENTOLIN HFA) 108 (90 Base) MCG/ACT inhaler Inhale 1-2 puffs into the lungs every 6  (six) hours as needed for wheezing or shortness of breath.     amLODipine (NORVASC) 5 MG tablet Take 5 mg by mouth at bedtime.     budesonide (ENTOCORT EC) 3 MG 24 hr capsule Take 1 capsule (3 mg total) by mouth daily. 9 mg daily for 2 weeks followed by 6 mg daily for 2 weeks and then 3 mg daily for 2 weeks 90 capsule 0   cyclobenzaprine (FLEXERIL) 10 MG tablet Take 1 tablet (10 mg total) by mouth 3 (three) times daily as needed for muscle spasms. 40 tablet 0   diphenhydrAMINE (BENADRYL) 25 MG tablet Take 25 mg by mouth every 6 (six) hours as needed for allergies.     docusate sodium (COLACE) 100 MG capsule Take 1 capsule (100 mg total) by mouth 2 (two) times daily. 28 capsule 0   EPINEPHrine (EPIPEN) 0.3 mg/0.3 mL SOAJ injection Inject 0.3 mLs (0.3 mg total) into the muscle once. 1 Device 0  famotidine (PEPCID) 40 MG tablet Take 1 tablet (40 mg total) by mouth at bedtime. 90 tablet 1   gabapentin (NEURONTIN) 300 MG capsule Take 1 capsule (300 mg total) by mouth at bedtime. 90 capsule 5   losartan (COZAAR) 100 MG tablet Take 100 mg by mouth at bedtime.      Magnesium 500 MG CAPS Take 2,000 mg by mouth at bedtime.      meclizine (ANTIVERT) 25 MG tablet Take 25 mg by mouth 3 (three) times daily as needed for dizziness.     montelukast (SINGULAIR) 10 MG tablet Take 10 mg by mouth at bedtime.     Multiple Vitamin (MULTIVITAMIN) tablet Take 1 tablet by mouth daily.     ondansetron (ZOFRAN) 4 MG tablet Take 1 tablet (4 mg total) by mouth every 8 (eight) hours as needed for nausea or vomiting. 20 tablet 0   oxyCODONE (OXY IR/ROXICODONE) 5 MG immediate release tablet Take 1-2 tablets (5-10 mg total) by mouth every 4 (four) hours as needed for moderate pain or severe pain. 60 tablet 0   pantoprazole (PROTONIX) 40 MG tablet TAKE (1) TABLET BY MOUTH TWICE DAILY BEFORE A MEAL. (Patient taking differently: Take 40 mg by mouth at bedtime. ) 60 tablet 1   polyethylene glycol (MIRALAX / GLYCOLAX)  17 g packet Take 17 g by mouth 2 (two) times daily. 28 packet 0   Vitamin D-Vitamin K (K2 PLUS D3 PO) Take 2 capsules by mouth daily.     No current facility-administered medications on file prior to visit.    Allergies  Allergen Reactions   Iodinated Diagnostic Agents Anaphylaxis and Hives   Other Shortness Of Breath    Lemon grass   Wasp Venom Anaphylaxis and Shortness Of Breath   Pollen Extract Swelling   Adhesive [Tape] Rash   Hydromorphone Hcl Nausea And Vomiting   Omnipaque [Iohexol] Hives and Nausea Only    Pt. Was premedicated with emergent protocol.    Review of Systems     Objective:   Physical Exam  BP (!) 146/80    Pulse 98    Temp 98.5 F (36.9 C)    Ht 5' 11"  (1.803 m)    Wt 234 lb 3.2 oz (106.2 kg)    BMI 32.66 kg/m  General: 53 year old female in no acute distress Eyes: Sclera nonicteric, conjunctiva pink Heart: Regular rate and rhythm, no murmurs Lungs: Breath sounds clear throughout Abdomen: Moderate tenderness to this periumbilical area, central lower abdomen and left lower quadrant without rebound or guarding, negative shake tenderness, positive bowel sounds to all 4 quadrants, no HSM Extremities: No edema Neuro: Alert and oriented x4, no focal deficits     Assessment & Plan:  27.  53 year old female with a history of ileal Crohn's disease and diverticulitis.  She presents with complaints of central lower and left lower quadrant abdominal pain -CBC, CMP and CRP now -Abdominal/pelvic CT stat -Patient aware to go to the local emergency room if her abdominal pain worsens -Push fluids, soft diet for now -Further recommendations to be determined after the above results received -She has a supply of Cipro and Flagyl, I advised that she take  Cipro 500 mg p.o. twice daily for 10 days and Flagyl 500 mg 1 tab p.o. twice daily for 10 days

## 2019-08-02 ENCOUNTER — Ambulatory Visit (HOSPITAL_COMMUNITY)
Admission: RE | Admit: 2019-08-02 | Discharge: 2019-08-02 | Disposition: A | Discharge status: Home / Self Care | Payer: 59 | Source: Ambulatory Visit | Attending: Nurse Practitioner | Admitting: Nurse Practitioner

## 2019-08-02 DIAGNOSIS — K5732 Diverticulitis of large intestine without perforation or abscess without bleeding: Secondary | ICD-10-CM | POA: Diagnosis not present

## 2019-08-02 DIAGNOSIS — R1030 Lower abdominal pain, unspecified: Secondary | ICD-10-CM | POA: Insufficient documentation

## 2019-08-02 LAB — COMPLETE METABOLIC PANEL WITH GFR
AG Ratio: 1.7 (calc) (ref 1.0–2.5)
ALT: 12 U/L (ref 6–29)
AST: 14 U/L (ref 10–35)
Albumin: 4.3 g/dL (ref 3.6–5.1)
Alkaline phosphatase (APISO): 58 U/L (ref 37–153)
BUN: 10 mg/dL (ref 7–25)
CO2: 28 mmol/L (ref 20–32)
Calcium: 9.4 mg/dL (ref 8.6–10.4)
Chloride: 102 mmol/L (ref 98–110)
Creat: 0.79 mg/dL (ref 0.50–1.05)
GFR, Est African American: 100 mL/min/{1.73_m2} (ref >=60)
GFR, Est Non African American: 87 mL/min/{1.73_m2} (ref >=60)
Globulin: 2.6 g/dL (calc) (ref 1.9–3.7)
Glucose, Bld: 83 mg/dL (ref 65–139)
Potassium: 4.3 mmol/L (ref 3.5–5.3)
Sodium: 139 mmol/L (ref 135–146)
Total Bilirubin: 0.6 mg/dL (ref 0.2–1.2)
Total Protein: 6.9 g/dL (ref 6.1–8.1)

## 2019-08-02 LAB — CBC WITH DIFFERENTIAL/PLATELET
Absolute Monocytes: 512 cells/uL (ref 200–950)
Basophils Absolute: 31 cells/uL (ref 0–200)
Basophils Relative: 0.5 %
Eosinophils Absolute: 61 cells/uL (ref 15–500)
Eosinophils Relative: 1 %
HCT: 36.1 % (ref 35.0–45.0)
Hemoglobin: 11.7 g/dL (ref 11.7–15.5)
Lymphs Abs: 1440 cells/uL (ref 850–3900)
MCH: 27.5 pg (ref 27.0–33.0)
MCHC: 32.4 g/dL (ref 32.0–36.0)
MCV: 84.9 fL (ref 80.0–100.0)
MPV: 10.4 fL (ref 7.5–12.5)
Monocytes Relative: 8.4 %
Neutro Abs: 4057 cells/uL (ref 1500–7800)
Neutrophils Relative %: 66.5 %
Platelets: 381 10*3/uL (ref 140–400)
RBC: 4.25 10*6/uL (ref 3.80–5.10)
RDW: 13.2 % (ref 11.0–15.0)
Total Lymphocyte: 23.6 %
WBC: 6.1 10*3/uL (ref 3.8–10.8)

## 2019-08-02 LAB — C-REACTIVE PROTEIN: CRP: 83.3 mg/L — ABNORMAL HIGH (ref <8.0)

## 2019-08-03 ENCOUNTER — Encounter (HOSPITAL_COMMUNITY): Payer: Self-pay | Admitting: Physical Therapy

## 2019-08-03 ENCOUNTER — Encounter (HOSPITAL_COMMUNITY): Payer: 59 | Admitting: Physical Therapy

## 2019-08-03 ENCOUNTER — Telehealth (HOSPITAL_COMMUNITY): Payer: Self-pay | Admitting: Physical Therapy

## 2019-08-03 ENCOUNTER — Telehealth (INDEPENDENT_AMBULATORY_CARE_PROVIDER_SITE_OTHER): Payer: Self-pay | Admitting: Nurse Practitioner

## 2019-08-03 NOTE — Telephone Encounter (Signed)
pt called to cancel due to she has to do a stat ct today for her diverticulitis.

## 2019-08-03 NOTE — Telephone Encounter (Signed)
Patient called regarding test results  -  Ph# [phone]

## 2019-08-03 NOTE — Telephone Encounter (Signed)
I called the patient, I also called  her yesterday as well with results of her lab test and CAT scan result.  I left a second message today letting the patient know her white and red blood cell counts were normal.  Her CRP was markedly elevated.  Her CAT scan confirms sigmoid diverticulitis.  She is to take Cipro and Flagyl 500 mg 1 p.o. twice daily for total of 10 days.  Advised patient to call the office if she is not seeing improvement.

## 2019-08-08 ENCOUNTER — Other Ambulatory Visit: Payer: Self-pay

## 2019-08-08 ENCOUNTER — Encounter (HOSPITAL_COMMUNITY): Payer: Self-pay | Admitting: Physical Therapy

## 2019-08-08 ENCOUNTER — Telehealth (INDEPENDENT_AMBULATORY_CARE_PROVIDER_SITE_OTHER): Payer: Self-pay | Admitting: Nurse Practitioner

## 2019-08-08 ENCOUNTER — Ambulatory Visit (HOSPITAL_COMMUNITY): Payer: PRIVATE HEALTH INSURANCE | Attending: Orthopedic Surgery | Admitting: Physical Therapy

## 2019-08-08 DIAGNOSIS — M6281 Muscle weakness (generalized): Secondary | ICD-10-CM | POA: Diagnosis not present

## 2019-08-08 DIAGNOSIS — R2689 Other abnormalities of gait and mobility: Secondary | ICD-10-CM | POA: Insufficient documentation

## 2019-08-08 DIAGNOSIS — M25662 Stiffness of left knee, not elsewhere classified: Secondary | ICD-10-CM | POA: Diagnosis not present

## 2019-08-08 DIAGNOSIS — M25562 Pain in left knee: Secondary | ICD-10-CM | POA: Insufficient documentation

## 2019-08-08 DIAGNOSIS — R29898 Other symptoms and signs involving the musculoskeletal system: Secondary | ICD-10-CM | POA: Diagnosis not present

## 2019-08-08 NOTE — Therapy (Signed)
[place], [place], [address] Phone: [phone]   Fax:  [phone]  Physical Therapy Treatment  Patient Details  Name: [name] MRN: [id number] Date of Birth: 03/28/1966 Referring Provider (PT): Paralee Cancel, MD   Encounter Date: 08/08/2019  PT End of Session - 08/08/19 1145    Visit Number  19    Number of Visits  24    Date for PT Re-Evaluation  08/17/19   Mini re-assess performed 06/28/19   Authorization Type  [employer] (1 eval and 16 treatment sessions approved; 12 visits approved 07/20/19)    Authorization Time Period  06/08/19 - 07/20/19; 07/20/19-08/17/19    Authorization - Visit Number  54    Authorization - Number of Visits  29    PT Start Time  3007   Patient arrived late   PT Stop Time  1200    PT Time Calculation (min)  25 min    Equipment Utilized During Treatment  Other (comment)   spc   Activity Tolerance  Patient tolerated treatment well    Behavior During Therapy  [facility] for tasks assessed/performed       Past Medical History:  Diagnosis Date  . Anxiety   . Arthritis   . Asthma   . Cervical hyperplasia    hisotry of  . Chest pain    cath 2005 norm cors, repeat cath Feb 2011 normal cors and only minimally  elevated pulmonary pressures  . Crohn disease ([place])   . Diverticulosis   . GERD (gastroesophageal reflux disease)   . Hiatal hernia   . History of iron deficiency anemia   . Hypertension   . Migraines   . Obesity   . Palpitations   . Pneumonia 2008   14 days hospitalization, bilateral  . PONV (postoperative nausea and vomiting)   . Sleep apnea sleep study 11/03/2009    cpap; does sometimes, doesnt use every night. weight loss no longer using CPAP    Past Surgical History:  Procedure Laterality Date  . ABDOMINAL HYSTERECTOMY    . ANKLE ARTHROSCOPY  06/01/2012   Procedure: ANKLE ARTHROSCOPY;  Surgeon: Colin Rhein, MD;  Location: [facility];   Service: Orthopedics;  Laterality: Left;  left ankle arthorsocpy with extensive debridement and gastroc slide  . ANKLE SURGERY Left 05/11/2018  . BIOPSY  04/26/2019   Procedure: BIOPSY;  Surgeon: Rogene Houston, MD;  [facility];  Service: Endoscopy;;  ileum colon  . CARDIAC CATHETERIZATION  11/22/2009 and 2005   WNL  . CESAREAN SECTION    . CHOLECYSTECTOMY  09/08/2006   lap. chole.  . CHONDROPLASTY  06/17/2012   Procedure: CHONDROPLASTY;  Surgeon: Carole Civil, MD;  [facility];  Service: Orthopedics;  Laterality: Right;  right patella  . COLONOSCOPY N/A 04/26/2019   Procedure: COLONOSCOPY;  Surgeon: Rogene Houston, MD;  [facility];  Service: Endoscopy;  Laterality: N/A;  100  . ESOPHAGOGASTRODUODENOSCOPY N/A 05/14/2015   Procedure: ESOPHAGOGASTRODUODENOSCOPY (EGD);  Surgeon: Rogene Houston, MD;  [facility];  Service: Endoscopy;  Laterality: N/A;  730  . GIVENS CAPSULE STUDY N/A 05/24/2019   Procedure: GIVENS CAPSULE STUDY;  Surgeon: Rogene Houston, MD;  [facility];  Service: Endoscopy;  Laterality: N/A;  7:30  . INGUINAL HERNIA REPAIR  10/30/2008   right  . KNEE ARTHROSCOPY Right 2013  . KNEE ARTHROSCOPY WITH LATERAL MENISECTOMY Left 12/23/2016  Procedure: LEFT KNEE ARTHROSCOPY WITH LATERAL MENISECTOMY;  Surgeon: Carole Civil, MD;  Location: [facility];  Service: Orthopedics;  Laterality: Left;  . KNEE ARTHROSCOPY WITH MEDIAL MENISECTOMY Left 12/23/2016   Procedure: LEFT KNEE ARTHROSCOPY WITH MEDIAL MENISECTOMY CHONDROPLASTY PATELLA  AND MEDIAL FEMORAL CONDYLE LEFT KNEE;  Surgeon: Carole Civil, MD;  Location: [facility];  Service: Orthopedics;  Laterality: Left;  . SHOULDER ARTHROSCOPY WITH SUBACROMIAL DECOMPRESSION AND BICEP TENDON REPAIR Left 12/21/2017   Procedure: Left shoulder arthroscopic biceps tenodesis, SAD, DCR and labrum debridement;  Surgeon: Nicholes Stairs, MD;  Location: [facility];  Service: Orthopedics;   Laterality: Left;  120 mins  . SHOULDER SURGERY     right x 2   . TOTAL KNEE ARTHROPLASTY Left 06/06/2019   Procedure: TOTAL KNEE ARTHROPLASTY;  Surgeon: Paralee Cancel, MD;  Location: [facility];  Service: Orthopedics;  Laterality: Left;  70 mins    There were no vitals filed for this visit.  Subjective Assessment - 08/08/19 1144    Subjective  Patient reported that she has tightness in her knee she would rate as a 5/10.    Pertinent History  Left TKA 06/06/19, see above for further surgical history    Limitations  Standing;Walking;House hold activities    How long can you sit comfortably?  Not limited    Patient Stated Goals  Improving walking    Currently in Pain?  Yes    Pain Score  5     Pain Location  Knee    Pain Orientation  Left;Medial    Pain Descriptors / Indicators  Aching    Pain Type  Surgical pain    Pain Onset  1 to 4 weeks ago                       [facility] Adult PT Treatment/Exercise - 08/08/19 0001      Knee/Hip Exercises: Standing   Hip Abduction  Left;15 reps    Hip Extension  Left;15 reps    Lateral Step Up  Left;10 reps;Step Height: 6"    Forward Step Up  Left;10 reps;Step Height: 6"    Step Down  Left;10 reps;Step Height: 6"    Gait Training  Clinic ambulation with no AD (273f)      Knee/Hip Exercises: Seated   Sit to Sand  20 reps   Left foot back     Knee/Hip Exercises: Supine   Knee Extension  AROM    Knee Extension Limitations  1    Knee Flexion  AROM    Knee Flexion Limitations  113               PT Short Term Goals - 07/20/19 1825      PT SHORT TERM GOAL #1   Title  Patient will demonstrate understanding and report regular compliance with HEP to improve knee AROM, lower extremity strength, and overall functional mobility.    Time  3    Period  Weeks    Status  Achieved    Target Date  06/29/19      PT SHORT TERM GOAL #2   Title  Patient will demonstrate left knee extension/flexion active range of motion of at least  5-95 degrees to assist with more normalized gait pattern and stair ambulation.    Time  3    Period  Weeks    Status  Achieved    Target Date  06/29/19      PT SHORT  TERM GOAL #3   Title  Patient will demonstrate improvement of 1/2 MMT grade in all deficient musculature to assist with proper gait mechanics.     Baseline  06/28/19: Strength still deficient with functional strength assessment    Time  3    Period  Weeks    Status  Achieved    Target Date  06/29/19        PT Long Term Goals - 07/20/19 1826      PT LONG TERM GOAL #1   Title  Patient will perform single limb stance on left/right lower extremity for 3 seconds in order to assist with stair ambulation.    Baseline  07/20/19- L SLS 24 sec;R SLS 14 sec    Time  6    Period  Weeks    Status  Achieved      PT LONG TERM GOAL #2   Title  Patient will demonstrate improvement of 1 MMT grade in all musculature tested as deficient at evaluation to improve gait mechanics and safety.    Baseline  06/28/19: Strength still deficient with functional strength assessment    Time  6    Period  Weeks    Status  Achieved      PT LONG TERM GOAL #3   Title  Patient will improve ROM for left knee extension/flexion to 0-115 degrees to improve squatting, and other functional mobility.    Baseline  06/28/19: Left knee AROM 1-105 degrees; 07/20/19- 1-115 degrees    Time  4    Period  Weeks    Status  Partially Met    Target Date  08/17/19      PT LONG TERM GOAL #4   Title  Patient will demonstrate ability to perform at least 8 sit to stands on the 30 second chair rise indicating improved balance and functional mobility.    Baseline  07/20/19- 45 seconds    Time  4    Period  Weeks    Status  On-going    Target Date  08/17/19      PT LONG TERM GOAL #5   Title  Patient will report ability to ambulate for 10 minutes with least restrictive assistive device with no greater than 3/10 pain.    Time  4    Period  Weeks    Status  On-going     Target Date  08/17/19      PT LONG TERM GOAL #6   Title  Patient will demonstrate ability to ambulate at a gait velocity of at least 0.8 m/s with LRAD on 2MWT indicating improved ability to ambulate safely household and limited community distances.    Baseline  07/20/19- .95 m/s    Time  6    Period  Weeks    Status  Achieved            Plan - 08/08/19 1317    Clinical Impression Statement  Patient arrived late today which limited the session. Patient did miss several appointments due to diverticulitis flare up. Patient required cueing with lateral step-up to decrease compensations and improve use of left lower extremity muscles to eccentrically control descent. Added staggered sit to stands this session with patient demonstrating good form.    Personal Factors and Comorbidities  Past/Current Experience    Examination-Activity Limitations  Stand;Locomotion Level;Bed Mobility;Bend;Transfers;Stairs;Squat    Examination-Participation Restrictions  Community Activity    Stability/Clinical Decision Making  Stable/Uncomplicated    Rehab Potential  Good  PT Frequency  2x / week    PT Duration  4 weeks    PT Treatment/Interventions  ADLs/Self Care Home Management;Cryotherapy;Electrical Stimulation;DME Instruction;Gait training;Stair training;Functional mobility training;Therapeutic activities;Moist Heat;Therapeutic exercise;Balance training;Neuromuscular re-education;Patient/family education;Orthotic Fit/Training;Manual techniques;Compression bandaging;Scar mobilization;Passive range of motion;Dry needling;Energy conservation;Taping    PT Next Visit Plan  Continue to progress LT knee AROM as tolerated. Work on normalizing gait, continued quad strengthening as able.    PT Home Exercise Plan  Supine heel slides, quad sets, SAQ 10x 1-2 x/day. portal site mobilization, patella mobilization, heel slides with foot on wall Prone quadriceps stretch 3x30'' dail, self massage, butterfly stretch; 07/25/19:  SLR 2-3x6 repetitions 1x/day    Consulted and Agree with Plan of Care  Patient       Patient will benefit from skilled therapeutic intervention in order to improve the following deficits and impairments:  Abnormal gait, Increased fascial restricitons, Pain, Decreased mobility, Decreased activity tolerance, Decreased endurance, Decreased range of motion, Decreased strength, Hypomobility, Decreased balance, Difficulty walking, Increased edema, Impaired flexibility  Visit Diagnosis: Stiffness of left knee, not elsewhere classified  Acute pain of left knee  Muscle weakness (generalized)  Other symptoms and signs involving the musculoskeletal system  Other abnormalities of gait and mobility     Problem List Patient Active Problem List   Diagnosis Date Noted  . Iron deficiency anemia 06/20/2019  . S/P left TKA 06/06/2019  . Status post total left knee replacement 06/06/2019  . Special screening for malignant neoplasms, colon 03/28/2019  . Acute diverticulitis 09/26/2018  . LLQ abdominal pain 09/26/2018  . Acute Sigmoid diverticulitis 09/26/2018  . Derangement of anterior horn of lateral meniscus of left knee   . Derangement of posterior horn of medial meniscus of left knee   . Chest pain 04/25/2016  . Hypokalemia 04/25/2016  . Hyperglycemia 04/25/2016  . Diverticulitis of colon 07/17/2014  . Nausea without vomiting 07/17/2014  . Crohn disease ([place]) 07/17/2014  . Diverticulitis of colon without hemorrhage 08/10/2013  . History of arthroscopy of right knee 07/25/2012  . Difficulty in walking(719.7) 06/29/2012  . Weakness of right leg 06/29/2012  . Posterior tibial tendonitis 11/11/2011  . PTTD (posterior tibial tendon dysfunction) 11/11/2011  . Knee pain, right 11/11/2011  . Chest pain 07/16/2011  . Primary osteoarthritis of left knee 02/25/2010  . PAIN IN JOINT, MULTIPLE SITES 02/25/2010  . Essential hypertension 08/13/2009  . HIATAL HERNIA 08/13/2009  . PALPITATIONS  08/13/2009  . DYSPNEA 08/13/2009   Clarene Critchley PT, DPT 1:22 PM, 08/08/19 [place], [place], [address] Phone: [phone]   Fax:  [phone]  Name: [name] MRN: [id number] Date of Birth: 11-Dec-1965

## 2019-08-08 NOTE — Telephone Encounter (Signed)
Patient called stated she is about to run out of her antibiotic - stated it could be sent to [facility] outpatient pharmacy -

## 2019-08-09 ENCOUNTER — Other Ambulatory Visit: Payer: Self-pay | Admitting: Nurse Practitioner

## 2019-08-09 ENCOUNTER — Other Ambulatory Visit: Payer: Self-pay

## 2019-08-09 DIAGNOSIS — K5732 Diverticulitis of large intestine without perforation or abscess without bleeding: Secondary | ICD-10-CM

## 2019-08-09 MED ORDER — CIPROFLOXACIN HCL 500 MG PO TABS: 500.0000 mg | ORAL_TABLET | Freq: Two times a day (BID) | ORAL | 0 refills | Status: AC

## 2019-08-09 MED ORDER — METRONIDAZOLE 500 MG PO TABS: 500.0000 mg | ORAL_TABLET | Freq: Two times a day (BID) | ORAL | 0 refills | Status: AC

## 2019-08-09 MED FILL — CIPROFLOXACIN HCL 500 MG TA: 500 | 7 days supply | Qty: 14 | Fill #0

## 2019-08-09 MED FILL — METRONIDAZOLE 500 MG TABS: 500 | 7 days supply | Qty: 14 | Fill #0

## 2019-08-09 NOTE — Telephone Encounter (Signed)
I attempted to call the patient, her voicemail was not accepting messages.  She was seen in the office on 10/27, at that time she was to initiate Cipro 500 mg twice daily and Flagyl 500 mg twice daily for total of 10 days, she stated she had a supply of these medications therefore a prescription was not provided at that time.  I will go ahead and send a prescription to her pharmacy for Cipro 500 mg twice daily for 7 days and Flagyl 500 mg 1 p.o. twice daily for 7 days to make sure she had a enough antibiotics for 10 to 14 days.

## 2019-08-09 NOTE — Telephone Encounter (Signed)
Faxed drug clarification to [facility] for patient's flagyl.

## 2019-08-10 ENCOUNTER — Ambulatory Visit (HOSPITAL_COMMUNITY): Payer: PRIVATE HEALTH INSURANCE | Admitting: Physical Therapy

## 2019-08-10 ENCOUNTER — Other Ambulatory Visit: Payer: Self-pay

## 2019-08-10 ENCOUNTER — Encounter (HOSPITAL_COMMUNITY): Payer: Self-pay | Admitting: Physical Therapy

## 2019-08-10 DIAGNOSIS — R2689 Other abnormalities of gait and mobility: Secondary | ICD-10-CM

## 2019-08-10 DIAGNOSIS — R29898 Other symptoms and signs involving the musculoskeletal system: Secondary | ICD-10-CM | POA: Diagnosis not present

## 2019-08-10 DIAGNOSIS — M25662 Stiffness of left knee, not elsewhere classified: Secondary | ICD-10-CM | POA: Diagnosis not present

## 2019-08-10 DIAGNOSIS — M25562 Pain in left knee: Secondary | ICD-10-CM | POA: Diagnosis not present

## 2019-08-10 DIAGNOSIS — M6281 Muscle weakness (generalized): Secondary | ICD-10-CM

## 2019-08-10 NOTE — Therapy (Signed)
[place], [place], [address] Phone: [phone]   Fax:  [phone]  Physical Therapy Treatment  Patient Details  Name: [name] MRN: [id number] Date of Birth: 06-16-1966 Referring Provider (PT): Paralee Cancel, MD   Encounter Date: 08/10/2019  PT End of Session - 08/10/19 1446    Visit Number  20    Number of Visits  24    Date for PT Re-Evaluation  08/17/19   Mini re-assess performed 06/28/19   Authorization Type  [employer] (1 eval and 16 treatment sessions approved; 12 visits approved 07/20/19)    Authorization Time Period  06/08/19 - 07/20/19; 07/20/19-08/17/19    Authorization - Visit Number  20    Authorization - Number of Visits  29    PT Start Time  1607    PT Stop Time  1515    PT Time Calculation (min)  42 min    Equipment Utilized During Treatment  Other (comment)   spc   Activity Tolerance  Patient tolerated treatment well    Behavior During Therapy  [facility] for tasks assessed/performed       Past Medical History:  Diagnosis Date  . Anxiety   . Arthritis   . Asthma   . Cervical hyperplasia    hisotry of  . Chest pain    cath 2005 norm cors, repeat cath Feb 2011 normal cors and only minimally  elevated pulmonary pressures  . Crohn disease ([place])   . Diverticulosis   . GERD (gastroesophageal reflux disease)   . Hiatal hernia   . History of iron deficiency anemia   . Hypertension   . Migraines   . Obesity   . Palpitations   . Pneumonia 2008   14 days hospitalization, bilateral  . PONV (postoperative nausea and vomiting)   . Sleep apnea sleep study 11/03/2009    cpap; does sometimes, doesnt use every night. weight loss no longer using CPAP    Past Surgical History:  Procedure Laterality Date  . ABDOMINAL HYSTERECTOMY    . ANKLE ARTHROSCOPY  06/01/2012   Procedure: ANKLE ARTHROSCOPY;  Surgeon: Colin Rhein, MD;  Location: [facility];  Service: Orthopedics;   Laterality: Left;  left ankle arthorsocpy with extensive debridement and gastroc slide  . ANKLE SURGERY Left 05/11/2018  . BIOPSY  04/26/2019   Procedure: BIOPSY;  Surgeon: Rogene Houston, MD;  [facility];  Service: Endoscopy;;  ileum colon  . CARDIAC CATHETERIZATION  11/22/2009 and 2005   WNL  . CESAREAN SECTION    . CHOLECYSTECTOMY  09/08/2006   lap. chole.  . CHONDROPLASTY  06/17/2012   Procedure: CHONDROPLASTY;  Surgeon: Carole Civil, MD;  [facility];  Service: Orthopedics;  Laterality: Right;  right patella  . COLONOSCOPY N/A 04/26/2019   Procedure: COLONOSCOPY;  Surgeon: Rogene Houston, MD;  [facility];  Service: Endoscopy;  Laterality: N/A;  100  . ESOPHAGOGASTRODUODENOSCOPY N/A 05/14/2015   Procedure: ESOPHAGOGASTRODUODENOSCOPY (EGD);  Surgeon: Rogene Houston, MD;  [facility];  Service: Endoscopy;  Laterality: N/A;  730  . GIVENS CAPSULE STUDY N/A 05/24/2019   Procedure: GIVENS CAPSULE STUDY;  Surgeon: Rogene Houston, MD;  [facility];  Service: Endoscopy;  Laterality: N/A;  7:30  . INGUINAL HERNIA REPAIR  10/30/2008   right  . KNEE ARTHROSCOPY Right 2013  . KNEE ARTHROSCOPY WITH LATERAL MENISECTOMY Left 12/23/2016   Procedure: LEFT KNEE  ARTHROSCOPY WITH LATERAL MENISECTOMY;  Surgeon: Carole Civil, MD;  Location[facility];  Service: Orthopedics;  Laterality: Left;  . KNEE ARTHROSCOPY WITH MEDIAL MENISECTOMY Left 12/23/2016   Procedure: LEFT KNEE ARTHROSCOPY WITH MEDIAL MENISECTOMY CHONDROPLASTY PATELLA  AND MEDIAL FEMORAL CONDYLE LEFT KNEE;  Surgeon: Carole Civil, MD;  Location: [facility];  Service: Orthopedics;  Laterality: Left;  . SHOULDER ARTHROSCOPY WITH SUBACROMIAL DECOMPRESSION AND BICEP TENDON REPAIR Left 12/21/2017   Procedure: Left shoulder arthroscopic biceps tenodesis, SAD, DCR and labrum debridement;  Surgeon: Nicholes Stairs, MD;  Location: [facility];  Service: Orthopedics;  Laterality: Left;  120 mins   . SHOULDER SURGERY     right x 2   . TOTAL KNEE ARTHROPLASTY Left 06/06/2019   Procedure: TOTAL KNEE ARTHROPLASTY;  Surgeon: Paralee Cancel, MD;  Location: [facility];  Service: Orthopedics;  Laterality: Left;  70 mins    There were no vitals filed for this visit.  Subjective Assessment - 08/10/19 1432    Subjective  She reports she is doing alright with tightness and swelling. "Tightness" 4/10.    Pertinent History  Left TKA 06/06/19, see above for further surgical history    Limitations  Standing;Walking;House hold activities    How long can you sit comfortably?  Not limited    Patient Stated Goals  Improving walking    Currently in Pain?  Yes    Pain Score  4     Pain Location  Knee    Pain Onset  1 to 4 weeks ago                       [facility] Adult PT Treatment/Exercise - 08/10/19 0001      Knee/Hip Exercises: Standing   Hip Abduction  2 sets;10 reps;Both    Hip Extension  2 sets;10 reps;Both    Lateral Step Up  Left;10 reps;Step Height: 6";2 sets   emphesis on eccentric step down phase with minimal UE suppor   Forward Step Up  Left;10 reps;Step Height: 6";2 sets    Other Standing Knee Exercises  lateral stepping with mini squat 4x15 feet bilateral      Knee/Hip Exercises: Seated   Other Seated Knee/Hip Exercises  leg press: 2 plates 1x10               PT Short Term Goals - 07/20/19 1825      PT SHORT TERM GOAL #1   Title  Patient will demonstrate understanding and report regular compliance with HEP to improve knee AROM, lower extremity strength, and overall functional mobility.    Time  3    Period  Weeks    Status  Achieved    Target Date  06/29/19      PT SHORT TERM GOAL #2   Title  Patient will demonstrate left knee extension/flexion active range of motion of at least 5-95 degrees to assist with more normalized gait pattern and stair ambulation.    Time  3    Period  Weeks    Status  Achieved    Target Date  06/29/19      PT SHORT TERM GOAL #3    Title  Patient will demonstrate improvement of 1/2 MMT grade in all deficient musculature to assist with proper gait mechanics.     Baseline  06/28/19: Strength still deficient with functional strength assessment    Time  3    Period  Weeks    Status  Achieved  Target Date  06/29/19        PT Long Term Goals - 07/20/19 1826      PT LONG TERM GOAL #1   Title  Patient will perform single limb stance on left/right lower extremity for 3 seconds in order to assist with stair ambulation.    Baseline  07/20/19- L SLS 24 sec;R SLS 14 sec    Time  6    Period  Weeks    Status  Achieved      PT LONG TERM GOAL #2   Title  Patient will demonstrate improvement of 1 MMT grade in all musculature tested as deficient at evaluation to improve gait mechanics and safety.    Baseline  06/28/19: Strength still deficient with functional strength assessment    Time  6    Period  Weeks    Status  Achieved      PT LONG TERM GOAL #3   Title  Patient will improve ROM for left knee extension/flexion to 0-115 degrees to improve squatting, and other functional mobility.    Baseline  06/28/19: Left knee AROM 1-105 degrees; 07/20/19- 1-115 degrees    Time  4    Period  Weeks    Status  Partially Met    Target Date  08/17/19      PT LONG TERM GOAL #4   Title  Patient will demonstrate ability to perform at least 8 sit to stands on the 30 second chair rise indicating improved balance and functional mobility.    Baseline  07/20/19- 45 seconds    Time  4    Period  Weeks    Status  On-going    Target Date  08/17/19      PT LONG TERM GOAL #5   Title  Patient will report ability to ambulate for 10 minutes with least restrictive assistive device with no greater than 3/10 pain.    Time  4    Period  Weeks    Status  On-going    Target Date  08/17/19      PT LONG TERM GOAL #6   Title  Patient will demonstrate ability to ambulate at a gait velocity of at least 0.8 m/s with LRAD on 2MWT indicating improved  ability to ambulate safely household and limited community distances.    Baseline  07/20/19- .95 m/s    Time  6    Period  Weeks    Status  Achieved            Plan - 08/10/19 1446    Clinical Impression Statement  Patient continues to ambulate with antalgic gait pattern with use of AD secondary to decreased knee extension/flexion, pain, and impaired hip/knee strength. She demonstrates impaired eccentric quad strength during stair exercises and requires frequent verbal cueing for knee flexion and not using contralateral LE for assist. She does experience minimal left hip pain with side stepping and hip abduction/extension which improves with modification. Overall patient tolerates session well. Patient will continue to benefit from skilled physical therapy in order to reduce impairment and improve function.    Personal Factors and Comorbidities  Past/Current Experience    Examination-Activity Limitations  Stand;Locomotion Level;Bed Mobility;Bend;Transfers;Stairs;Squat    Examination-Participation Restrictions  Community Activity    Stability/Clinical Decision Making  Stable/Uncomplicated    Rehab Potential  Good    PT Frequency  2x / week    PT Duration  4 weeks    PT Treatment/Interventions  ADLs/Self Care Home Management;Cryotherapy;Electrical Stimulation;DME  Instruction;Gait training;Stair training;Functional mobility training;Therapeutic activities;Moist Heat;Therapeutic exercise;Balance training;Neuromuscular re-education;Patient/family education;Orthotic Fit/Training;Manual techniques;Compression bandaging;Scar mobilization;Passive range of motion;Dry needling;Energy conservation;Taping    PT Next Visit Plan  Continue to progress LT knee AROM as tolerated. Work on normalizing gait, continued quad strengthening as able.    PT Home Exercise Plan  Supine heel slides, quad sets, SAQ 10x 1-2 x/day. portal site mobilization, patella mobilization, heel slides with foot on wall Prone quadriceps  stretch 3x30'' dail, self massage, butterfly stretch; 07/25/19: SLR 2-3x6 repetitions 1x/day    Consulted and Agree with Plan of Care  Patient       Patient will benefit from skilled therapeutic intervention in order to improve the following deficits and impairments:  Abnormal gait, Increased fascial restricitons, Pain, Decreased mobility, Decreased activity tolerance, Decreased endurance, Decreased range of motion, Decreased strength, Hypomobility, Decreased balance, Difficulty walking, Increased edema, Impaired flexibility  Visit Diagnosis: Stiffness of left knee, not elsewhere classified  Acute pain of left knee  Muscle weakness (generalized)  Other symptoms and signs involving the musculoskeletal system  Other abnormalities of gait and mobility     Problem List Patient Active Problem List   Diagnosis Date Noted  . Iron deficiency anemia 06/20/2019  . S/P left TKA 06/06/2019  . Status post total left knee replacement 06/06/2019  . Special screening for malignant neoplasms, colon 03/28/2019  . Acute diverticulitis 09/26/2018  . LLQ abdominal pain 09/26/2018  . Acute Sigmoid diverticulitis 09/26/2018  . Derangement of anterior horn of lateral meniscus of left knee   . Derangement of posterior horn of medial meniscus of left knee   . Chest pain 04/25/2016  . Hypokalemia 04/25/2016  . Hyperglycemia 04/25/2016  . Diverticulitis of colon 07/17/2014  . Nausea without vomiting 07/17/2014  . Crohn disease ([place]) 07/17/2014  . Diverticulitis of colon without hemorrhage 08/10/2013  . History of arthroscopy of right knee 07/25/2012  . Difficulty in walking(719.7) 06/29/2012  . Weakness of right leg 06/29/2012  . Posterior tibial tendonitis 11/11/2011  . PTTD (posterior tibial tendon dysfunction) 11/11/2011  . Knee pain, right 11/11/2011  . Chest pain 07/16/2011  . Primary osteoarthritis of left knee 02/25/2010  . PAIN IN JOINT, MULTIPLE SITES 02/25/2010  . Essential  hypertension 08/13/2009  . HIATAL HERNIA 08/13/2009  . PALPITATIONS 08/13/2009  . DYSPNEA 08/13/2009    4:19 PM, 08/10/19 Mearl Latin PT, DPT Physical Therapist at [place], [place], [address] Phone: [phone]   Fax:  [phone]  Name: [name] MRN: [id number] Date of Birth: 01/25/1966

## 2019-08-15 ENCOUNTER — Encounter (HOSPITAL_COMMUNITY): Payer: Self-pay | Admitting: Physical Therapy

## 2019-08-15 ENCOUNTER — Ambulatory Visit (HOSPITAL_COMMUNITY): Payer: PRIVATE HEALTH INSURANCE | Admitting: Physical Therapy

## 2019-08-15 ENCOUNTER — Other Ambulatory Visit: Payer: Self-pay

## 2019-08-15 DIAGNOSIS — R29898 Other symptoms and signs involving the musculoskeletal system: Secondary | ICD-10-CM

## 2019-08-15 DIAGNOSIS — M6281 Muscle weakness (generalized): Secondary | ICD-10-CM

## 2019-08-15 DIAGNOSIS — M25662 Stiffness of left knee, not elsewhere classified: Secondary | ICD-10-CM

## 2019-08-15 DIAGNOSIS — R2689 Other abnormalities of gait and mobility: Secondary | ICD-10-CM

## 2019-08-15 DIAGNOSIS — M25562 Pain in left knee: Secondary | ICD-10-CM | POA: Diagnosis not present

## 2019-08-15 NOTE — Therapy (Signed)
[place], [place], [address] Phone: [phone]   Fax:  [phone]  Physical Therapy Treatment  Patient Details  Name: [name] MRN: [id number] Date of Birth: 05/20/1966 Referring Provider (PT): Paralee Cancel, MD   Encounter Date: 08/15/2019  PT End of Session - 08/15/19 1308    Visit Number  21    Number of Visits  24    Date for PT Re-Evaluation  08/17/19   Mini re-assess performed 06/28/19   Authorization Type  [employer] (1 eval and 16 treatment sessions approved; 12 visits approved 07/20/19)    Authorization Time Period  06/08/19 - 07/20/19; 07/20/19-08/17/19    Authorization - Visit Number  21    Authorization - Number of Visits  29    PT Start Time  1301    PT Stop Time  1344    PT Time Calculation (min)  43 min    Equipment Utilized During Treatment  Other (comment)   spc   Activity Tolerance  Patient tolerated treatment well    Behavior During Therapy  WFL for tasks assessed/performed       Past Medical History:  Diagnosis Date  . Anxiety   . Arthritis   . Asthma   . Cervical hyperplasia    hisotry of  . Chest pain    cath 2005 norm cors, repeat cath Feb 2011 normal cors and only minimally  elevated pulmonary pressures  . Crohn disease ([place])   . Diverticulosis   . GERD (gastroesophageal reflux disease)   . Hiatal hernia   . History of iron deficiency anemia   . Hypertension   . Migraines   . Obesity   . Palpitations   . Pneumonia 2008   14 days hospitalization, bilateral  . PONV (postoperative nausea and vomiting)   . Sleep apnea sleep study 11/03/2009    cpap; does sometimes, doesnt use every night. weight loss no longer using CPAP    Past Surgical History:  Procedure Laterality Date  . ABDOMINAL HYSTERECTOMY    . ANKLE ARTHROSCOPY  06/01/2012   Procedure: ANKLE ARTHROSCOPY;  Surgeon: Colin Rhein, MD;  Location: [facility];  Service: Orthopedics;   Laterality: Left;  left ankle arthorsocpy with extensive debridement and gastroc slide  . ANKLE SURGERY Left 05/11/2018  . BIOPSY  04/26/2019   Procedure: BIOPSY;  Surgeon: Rogene Houston, MD;  [facility];  Service: Endoscopy;;  ileum colon  . CARDIAC CATHETERIZATION  11/22/2009 and 2005   WNL  . CESAREAN SECTION    . CHOLECYSTECTOMY  09/08/2006   lap. chole.  . CHONDROPLASTY  06/17/2012   Procedure: CHONDROPLASTY;  Surgeon: Carole Civil, MD;  Location: [facility];  Service: Orthopedics;  Laterality: Right;  right patella  . COLONOSCOPY N/A 04/26/2019   Procedure: COLONOSCOPY;  Surgeon: Rogene Houston, MD;  [facility];  Service: Endoscopy;  Laterality: N/A;  100  . ESOPHAGOGASTRODUODENOSCOPY N/A 05/14/2015   Procedure: ESOPHAGOGASTRODUODENOSCOPY (EGD);  Surgeon: Rogene Houston, MD;  [facility];  Service: Endoscopy;  Laterality: N/A;  730  . GIVENS CAPSULE STUDY N/A 05/24/2019   Procedure: GIVENS CAPSULE STUDY;  Surgeon: Rogene Houston, MD;  [facility];  Service: Endoscopy;  Laterality: N/A;  7:30  . INGUINAL HERNIA REPAIR  10/30/2008   right  . KNEE ARTHROSCOPY Right 2013  . KNEE ARTHROSCOPY WITH LATERAL MENISECTOMY Left 12/23/2016   Procedure: LEFT KNEE  ARTHROSCOPY WITH LATERAL MENISECTOMY;  Surgeon: Carole Civil, MD;  Location: [facility];  Service: Orthopedics;  Laterality: Left;  . KNEE ARTHROSCOPY WITH MEDIAL MENISECTOMY Left 12/23/2016   Procedure: LEFT KNEE ARTHROSCOPY WITH MEDIAL MENISECTOMY CHONDROPLASTY PATELLA  AND MEDIAL FEMORAL CONDYLE LEFT KNEE;  Surgeon: Carole Civil, MD;  Location: [facility];  Service: Orthopedics;  Laterality: Left;  . SHOULDER ARTHROSCOPY WITH SUBACROMIAL DECOMPRESSION AND BICEP TENDON REPAIR Left 12/21/2017   Procedure: Left shoulder arthroscopic biceps tenodesis, SAD, DCR and labrum debridement;  Surgeon: Nicholes Stairs, MD;  Location: [facility];  Service: Orthopedics;  Laterality: Left;  120 mins   . SHOULDER SURGERY     right x 2   . TOTAL KNEE ARTHROPLASTY Left 06/06/2019   Procedure: TOTAL KNEE ARTHROPLASTY;  Surgeon: Paralee Cancel, MD;  Location: [facility];  Service: Orthopedics;  Laterality: Left;  70 mins    There were no vitals filed for this visit.  Subjective Assessment - 08/15/19 1301    Subjective  Patient reports her knee occasionally buckles. She says she still has pain and swelling most days. Her pain is worse in weightbearing. Her knee is still aggravating occasionally. She continues to have more pain once on feet for about 10-15 minutes.    Pertinent History  Left TKA 06/06/19, see above for further surgical history    Limitations  Standing;Walking;House hold activities    How long can you sit comfortably?  Not limited    Patient Stated Goals  Improving walking    Currently in Pain?  Yes    Pain Score  3     Pain Location  Knee    Pain Orientation  Left    Pain Descriptors / Indicators  Aching    Pain Onset  1 to 4 weeks ago                       [facility] Adult PT Treatment/Exercise - 08/15/19 0001      Ambulation/Gait   Ambulation/Gait  Yes    Ambulation/Gait Assistance  6: Modified independent (Device/Increase time)    Ambulation Distance (Feet)  660 Feet    Gait Pattern  Decreased stance time - left;Decreased step length - right;Trendelenburg;Antalgic   L hip abductor weakness   Ambulation Surface  Level      Knee/Hip Exercises: Machines for Strengthening   Cybex Leg Press  2 plates, 2 x 10 reps, both, focus on eccentric, Single leg      Knee/Hip Exercises: Standing   Hip Abduction  2 sets;Both;15 reps    Hip Extension  2 sets;Both;15 reps    Lateral Step Up  Left;10 reps;Step Height: 6";3 sets    emphesis on eccentric step down phase with minimal UE suppo   Forward Step Up  Left;10 reps;Step Height: 6";2 sets    Other Standing Knee Exercises  lateral stepping with mini squat 4x15 feet bilateral             PT Education - 08/15/19  1308    Education Details  Exercise technique, continue HEP    Person(s) Educated  Patient    Methods  Explanation    Comprehension  Verbalized understanding       PT Short Term Goals - 07/20/19 1825      PT SHORT TERM GOAL #1   Title  Patient will demonstrate understanding and report regular compliance with HEP to improve knee AROM, lower extremity strength, and overall functional mobility.  Time  3    Period  Weeks    Status  Achieved    Target Date  06/29/19      PT SHORT TERM GOAL #2   Title  Patient will demonstrate left knee extension/flexion active range of motion of at least 5-95 degrees to assist with more normalized gait pattern and stair ambulation.    Time  3    Period  Weeks    Status  Achieved    Target Date  06/29/19      PT SHORT TERM GOAL #3   Title  Patient will demonstrate improvement of 1/2 MMT grade in all deficient musculature to assist with proper gait mechanics.     Baseline  06/28/19: Strength still deficient with functional strength assessment    Time  3    Period  Weeks    Status  Achieved    Target Date  06/29/19        PT Long Term Goals - 07/20/19 1826      PT LONG TERM GOAL #1   Title  Patient will perform single limb stance on left/right lower extremity for 3 seconds in order to assist with stair ambulation.    Baseline  07/20/19- L SLS 24 sec;R SLS 14 sec    Time  6    Period  Weeks    Status  Achieved      PT LONG TERM GOAL #2   Title  Patient will demonstrate improvement of 1 MMT grade in all musculature tested as deficient at evaluation to improve gait mechanics and safety.    Baseline  06/28/19: Strength still deficient with functional strength assessment    Time  6    Period  Weeks    Status  Achieved      PT LONG TERM GOAL #3   Title  Patient will improve ROM for left knee extension/flexion to 0-115 degrees to improve squatting, and other functional mobility.    Baseline  06/28/19: Left knee AROM 1-105 degrees; 07/20/19- 1-115  degrees    Time  4    Period  Weeks    Status  Partially Met    Target Date  08/17/19      PT LONG TERM GOAL #4   Title  Patient will demonstrate ability to perform at least 8 sit to stands on the 30 second chair rise indicating improved balance and functional mobility.    Baseline  07/20/19- 45 seconds    Time  4    Period  Weeks    Status  On-going    Target Date  08/17/19      PT LONG TERM GOAL #5   Title  Patient will report ability to ambulate for 10 minutes with least restrictive assistive device with no greater than 3/10 pain.    Time  4    Period  Weeks    Status  On-going    Target Date  08/17/19      PT LONG TERM GOAL #6   Title  Patient will demonstrate ability to ambulate at a gait velocity of at least 0.8 m/s with LRAD on 2MWT indicating improved ability to ambulate safely household and limited community distances.    Baseline  07/20/19- .95 m/s    Time  6    Period  Weeks    Status  Achieved            Plan - 08/15/19 1308    Clinical Impression Statement  Patient demonstrates  improving eccentric quad strength on stair exercise and leg press. She continues to ambulate with antalgic gait pattern secondary to impaired quad strength, limited ROM, and hip abductor strength. She continues to have hip pain with proximal hip strengthening exercises but is able to complete them with minimal compensation. She requires minimal verbal cueing and demonstration for proper mechanics of exercises. She has moderate correction of gait pattern following verbal cueing. Patient will continue to benefit from skilled physical therapy in order to reduce impairment and improve function.    Personal Factors and Comorbidities  Past/Current Experience    Examination-Activity Limitations  Stand;Locomotion Level;Bed Mobility;Bend;Transfers;Stairs;Squat    Examination-Participation Restrictions  Community Activity    Stability/Clinical Decision Making  Stable/Uncomplicated    Rehab Potential   Good    PT Frequency  2x / week    PT Duration  4 weeks    PT Treatment/Interventions  ADLs/Self Care Home Management;Cryotherapy;Electrical Stimulation;DME Instruction;Gait training;Stair training;Functional mobility training;Therapeutic activities;Moist Heat;Therapeutic exercise;Balance training;Neuromuscular re-education;Patient/family education;Orthotic Fit/Training;Manual techniques;Compression bandaging;Scar mobilization;Passive range of motion;Dry needling;Energy conservation;Taping    PT Next Visit Plan  Continue to progress LT knee AROM as tolerated. Work on normalizing gait, continued quad strengthening as able.    PT Home Exercise Plan  Supine heel slides, quad sets, SAQ 10x 1-2 x/day. portal site mobilization, patella mobilization, heel slides with foot on wall Prone quadriceps stretch 3x30'' dail, self massage, butterfly stretch; 07/25/19: SLR 2-3x6 repetitions 1x/day    Consulted and Agree with Plan of Care  Patient       Patient will benefit from skilled therapeutic intervention in order to improve the following deficits and impairments:  Abnormal gait, Increased fascial restricitons, Pain, Decreased mobility, Decreased activity tolerance, Decreased endurance, Decreased range of motion, Decreased strength, Hypomobility, Decreased balance, Difficulty walking, Increased edema, Impaired flexibility  Visit Diagnosis: Stiffness of left knee, not elsewhere classified  Acute pain of left knee  Other symptoms and signs involving the musculoskeletal system  Muscle weakness (generalized)  Other abnormalities of gait and mobility     Problem List Patient Active Problem List   Diagnosis Date Noted  . Iron deficiency anemia 06/20/2019  . S/P left TKA 06/06/2019  . Status post total left knee replacement 06/06/2019  . Special screening for malignant neoplasms, colon 03/28/2019  . Acute diverticulitis 09/26/2018  . LLQ abdominal pain 09/26/2018  . Acute Sigmoid diverticulitis  09/26/2018  . Derangement of anterior horn of lateral meniscus of left knee   . Derangement of posterior horn of medial meniscus of left knee   . Chest pain 04/25/2016  . Hypokalemia 04/25/2016  . Hyperglycemia 04/25/2016  . Diverticulitis of colon 07/17/2014  . Nausea without vomiting 07/17/2014  . Crohn disease ([place]) 07/17/2014  . Diverticulitis of colon without hemorrhage 08/10/2013  . History of arthroscopy of right knee 07/25/2012  . Difficulty in walking(719.7) 06/29/2012  . Weakness of right leg 06/29/2012  . Posterior tibial tendonitis 11/11/2011  . PTTD (posterior tibial tendon dysfunction) 11/11/2011  . Knee pain, right 11/11/2011  . Chest pain 07/16/2011  . Primary osteoarthritis of left knee 02/25/2010  . PAIN IN JOINT, MULTIPLE SITES 02/25/2010  . Essential hypertension 08/13/2009  . HIATAL HERNIA 08/13/2009  . PALPITATIONS 08/13/2009  . DYSPNEA 08/13/2009    2:52 PM, 08/15/19 Mearl Latin PT, DPT Physical Therapist at [facility] [place], [place], [address] Phone: [phone]   Fax:  [phone]  Name: [name] [place]  [name] MRN: [id number] Date of Birth: Aug 17, 1966

## 2019-08-17 ENCOUNTER — Ambulatory Visit (HOSPITAL_COMMUNITY): Payer: PRIVATE HEALTH INSURANCE | Admitting: Physical Therapy

## 2019-08-17 ENCOUNTER — Other Ambulatory Visit: Payer: Self-pay

## 2019-08-17 ENCOUNTER — Encounter (HOSPITAL_COMMUNITY): Payer: Self-pay | Admitting: Physical Therapy

## 2019-08-17 DIAGNOSIS — M25562 Pain in left knee: Secondary | ICD-10-CM

## 2019-08-17 DIAGNOSIS — M6281 Muscle weakness (generalized): Secondary | ICD-10-CM

## 2019-08-17 DIAGNOSIS — R29898 Other symptoms and signs involving the musculoskeletal system: Secondary | ICD-10-CM

## 2019-08-17 DIAGNOSIS — M25662 Stiffness of left knee, not elsewhere classified: Secondary | ICD-10-CM

## 2019-08-17 DIAGNOSIS — R2689 Other abnormalities of gait and mobility: Secondary | ICD-10-CM | POA: Diagnosis not present

## 2019-08-17 MED FILL — LOSARTAN POTASSIUM 100 MG T: 100 | 30 days supply | Qty: 30 | Fill #2

## 2019-08-17 MED FILL — CYCLOBENZAPRINE HCL 10 MG T: 10 | 10 days supply | Qty: 30 | Fill #1

## 2019-08-17 MED FILL — MONTELUKAST SOD 10 MG TAB: 10 | 90 days supply | Qty: 90 | Fill #2

## 2019-08-17 MED FILL — PANTOPRAZOLE SOD DR 40 MG T: 40 | 45 days supply | Qty: 90 | Fill #0

## 2019-08-17 NOTE — Therapy (Signed)
[place], [place], [address] Phone: [phone]   Fax:  [phone]  Physical Therapy Treatment/Progress Note  Patient Details  Name: [name] MRN: [id number] Date of Birth: 1966-05-20 Referring Provider (PT): Paralee Cancel, MD   Encounter Date: 08/17/2019  Progress Note   Reporting Period 07/20/2019 to 08/17/2019   See note below for Objective Data and Assessment of Progress/Goals   PT End of Session - 08/17/19 1541    Visit Number  22    Number of Visits  32    Date for PT Re-Evaluation  09/14/19     Authorization Type  Workman's Comp (1 eval and 16 treatment sessions approved; 12 visits approved 07/20/19) additional workmans comp visits will need approved 08/17/2019    Authorization Time Period  06/08/19 - 07/20/19; 07/20/19-08/17/19; 08/17/2019-09/14/2019    Authorization - Visit Number  37    Authorization - Number of Visits  29    PT Start Time  5449    PT Stop Time  1345    PT Time Calculation (min)  40 min    Equipment Utilized During Treatment  Other (comment)   spc   Activity Tolerance  Patient tolerated treatment well    Behavior During Therapy  [facility] for tasks assessed/performed       Past Medical History:  Diagnosis Date  . Anxiety   . Arthritis   . Asthma   . Cervical hyperplasia    hisotry of  . Chest pain    cath 2005 norm cors, repeat cath Feb 2011 normal cors and only minimally  elevated pulmonary pressures  . Crohn disease ([place])   . Diverticulosis   . GERD (gastroesophageal reflux disease)   . Hiatal hernia   . History of iron deficiency anemia   . Hypertension   . Migraines   . Obesity   . Palpitations   . Pneumonia 2008   14 days hospitalization, bilateral  . PONV (postoperative nausea and vomiting)   . Sleep apnea sleep study 11/03/2009    cpap; does sometimes, doesnt use every night. weight loss no longer using CPAP    Past Surgical History:  Procedure  Laterality Date  . ABDOMINAL HYSTERECTOMY    . ANKLE ARTHROSCOPY  06/01/2012   Procedure: ANKLE ARTHROSCOPY;  Surgeon: Colin Rhein, MD;  Location: [place];  Service: Orthopedics;  Laterality: Left;  left ankle arthorsocpy with extensive debridement and gastroc slide  . ANKLE SURGERY Left 05/11/2018  . BIOPSY  04/26/2019   Procedure: BIOPSY;  Surgeon: Rogene Houston, MD;  [facility];  Service: Endoscopy;;  ileum colon  . CARDIAC CATHETERIZATION  11/22/2009 and 2005   WNL  . CESAREAN SECTION    . CHOLECYSTECTOMY  09/08/2006   lap. chole.  . CHONDROPLASTY  06/17/2012   Procedure: CHONDROPLASTY;  Surgeon: Carole Civil, MD;  Location[facility];  Service: Orthopedics;  Laterality: Right;  right patella  . COLONOSCOPY N/A 04/26/2019   Procedure: COLONOSCOPY;  Surgeon: Rogene Houston, MD;  [facility];  Service: Endoscopy;  Laterality: N/A;  100  . ESOPHAGOGASTRODUODENOSCOPY N/A 05/14/2015   Procedure: ESOPHAGOGASTRODUODENOSCOPY (EGD);  Surgeon: Rogene Houston, MD;  [facility];  Service: Endoscopy;  Laterality: N/A;  730  . GIVENS CAPSULE STUDY N/A 05/24/2019   Procedure: GIVENS CAPSULE STUDY;  Surgeon: Rogene Houston, MD;  [facility];  Service: Endoscopy;  Laterality: N/A;  7:30  .  INGUINAL HERNIA REPAIR  10/30/2008   right  . KNEE ARTHROSCOPY Right 2013  . KNEE ARTHROSCOPY WITH LATERAL MENISECTOMY Left 12/23/2016   Procedure: LEFT KNEE ARTHROSCOPY WITH LATERAL MENISECTOMY;  Surgeon: Carole Civil, MD;  Location: [facility];  Service: Orthopedics;  Laterality: Left;  . KNEE ARTHROSCOPY WITH MEDIAL MENISECTOMY Left 12/23/2016   Procedure: LEFT KNEE ARTHROSCOPY WITH MEDIAL MENISECTOMY CHONDROPLASTY PATELLA  AND MEDIAL FEMORAL CONDYLE LEFT KNEE;  Surgeon: Carole Civil, MD;  Location: [facility];  Service: Orthopedics;  Laterality: Left;  . SHOULDER ARTHROSCOPY WITH SUBACROMIAL DECOMPRESSION AND BICEP TENDON REPAIR Left  12/21/2017   Procedure: Left shoulder arthroscopic biceps tenodesis, SAD, DCR and labrum debridement;  Surgeon: Nicholes Stairs, MD;  Location: [place];  Service: Orthopedics;  Laterality: Left;  120 mins  . SHOULDER SURGERY     right x 2   . TOTAL KNEE ARTHROPLASTY Left 06/06/2019   Procedure: TOTAL KNEE ARTHROPLASTY;  Surgeon: Paralee Cancel, MD;  Location: [facility];  Service: Orthopedics;  Laterality: Left;  70 mins    There were no vitals filed for this visit.  Subjective Assessment - 08/17/19 1304    Subjective  Patient notes she has improved about 50% since starting physical therapy. She continues to have difficulty with weightbearing for more than 10-15 minutes as well as deficits in strength, ROM, swelling, endurance, and ambulation. Patient states she was very sore after last session with muscle soreness in her hips.    Pertinent History  Left TKA 06/06/19, see above for further surgical history    Limitations  Standing;Walking;House hold activities    How long can you sit comfortably?  Not limited    Patient Stated Goals  Improving walking    Currently in Pain?  Yes    Pain Score  3     Pain Location  Knee    Pain Orientation  Left    Pain Onset  1 to 4 weeks ago         [facility] PT Assessment - 08/17/19 0001      Assessment   Medical Diagnosis  S/P Left TKA    Referring Provider (PT)  Paralee Cancel, MD    Onset Date/Surgical Date  06/06/19    Hand Dominance  Right    Next MD Visit  08/30/19    Prior Therapy  Yes for ankle      Precautions   Precautions  Knee      Prior Function   Level of Independence  Independent;Independent with basic ADLs      Cognition   Overall Cognitive Status  Within Functional Limits for tasks assessed      Observation/Other Assessments   Focus on Therapeutic Outcomes (FOTO)   60% limited   IE: 69, Last reassessment 60%     AROM   Left Knee Extension  -1    Left Knee Flexion  118      Strength   Right Hip Flexion  4/5    Left Hip  Flexion  4/5    Right Knee Flexion  5/5    Right Knee Extension  5/5    Left Knee Flexion  4/5    Left Knee Extension  4/5    Right Ankle Dorsiflexion  5/5    Left Ankle Dorsiflexion  4+/5      Transfers   Five time sit to stand comments   five time sit to stand: 20.92 seconds    Comments  30 second sit to stand:  7 reps      Ambulation/Gait   Ambulation/Gait  Yes    Ambulation/Gait Assistance  7: Independent    Ambulation Distance (Feet)  339 Feet    Gait Pattern  Decreased stance time - left;Decreased step length - right;Trendelenburg;Antalgic    Ambulation Surface  Level    Gait velocity  .86 m/s    Gait Comments  2MWT      Static Standing Balance   Static Standing Balance -  Activities   Single Leg Stance - Right Leg;Single Leg Stance - Left Leg    Static Standing - Comment/# of Minutes  R15 seconds, L 20 seconds                           PT Education - 08/17/19 1539    Education Details  patietn educated on continueing HEP and continuing POC    Person(s) Educated  Patient    Methods  Explanation    Comprehension  Verbalized understanding       PT Short Term Goals - 07/20/19 1825      PT SHORT TERM GOAL #1   Title  Patient will demonstrate understanding and report regular compliance with HEP to improve knee AROM, lower extremity strength, and overall functional mobility.    Time  3    Period  Weeks    Status  Achieved    Target Date  06/29/19      PT SHORT TERM GOAL #2   Title  Patient will demonstrate left knee extension/flexion active range of motion of at least 5-95 degrees to assist with more normalized gait pattern and stair ambulation.    Time  3    Period  Weeks    Status  Achieved    Target Date  06/29/19      PT SHORT TERM GOAL #3   Title  Patient will demonstrate improvement of 1/2 MMT grade in all deficient musculature to assist with proper gait mechanics.     Baseline  06/28/19: Strength still deficient with functional strength  assessment    Time  3    Period  Weeks    Status  Achieved    Target Date  06/29/19        PT Long Term Goals - 08/17/19 1314      PT LONG TERM GOAL #1   Title  Patient will perform single limb stance on left/right lower extremity for 3 seconds in order to assist with stair ambulation.    Baseline  07/20/19- L SLS 24 sec;R SLS 14 sec    Time  6    Period  Weeks    Status  Achieved      PT LONG TERM GOAL #2   Title  Patient will demonstrate improvement of 1 MMT grade in all musculature tested as deficient at evaluation to improve gait mechanics and safety.    Baseline  06/28/19: Strength still deficient with functional strength assessment    Time  6    Period  Weeks    Status  Achieved      PT LONG TERM GOAL #3   Title  Patient will improve ROM for left knee extension/flexion to 0-115 degrees to improve squatting, and other functional mobility.    Baseline  06/28/19: Left knee AROM 1-105 degrees; 07/20/19- 1-115 degrees; 08/17/19 1-0-118    Time  4    Period  Weeks    Status  Achieved  PT LONG TERM GOAL #4   Title  Patient will demonstrate ability to perform at least 8 sit to stands on the 30 second chair rise indicating improved balance and functional mobility.    Baseline  07/20/19- 45 seconds    Time  4    Period  Weeks    Status  On-going      PT LONG TERM GOAL #5   Title  Patient will report ability to ambulate for 10 minutes with least restrictive assistive device with no greater than 3/10 pain.    Time  4    Period  Weeks    Status  On-going      PT LONG TERM GOAL #6   Title  Patient will demonstrate ability to ambulate at a gait velocity of at least 0.8 m/s with LRAD on 2MWT indicating improved ability to ambulate safely household and limited community distances.    Baseline  07/20/19- .95 m/s    Time  6    Period  Weeks    Status  Achieved            Plan - 08/17/19 1551    Clinical Impression Statement  Patient has met 3/3 short term goals and  4/6 long term goals at this time. Patient has improved in LE strength, ROM, and gait mechanics since last reevaluation. She continues to have impaired functional strength, endurance, gait, mobility, and balance as indicated by performance in 5x STS, 30 second STS, 2 MWT and SLS test as well as FOTO measurement. She ambulates with improved gait pattern with minimal deficits of antalgic and Trendelenburg gait in addition to impaired endurance with ambulation. Patient will continue to benefit from skilled physical therapy in order to improve gait, strength, endurance, balance, and mobility.    Personal Factors and Comorbidities  Past/Current Experience    Examination-Activity Limitations  Stand;Locomotion Level;Bed Mobility;Bend;Transfers;Stairs;Squat    Examination-Participation Restrictions  Community Activity    Stability/Clinical Decision Making  Stable/Uncomplicated    Rehab Potential  Good    PT Frequency  2x / week    PT Duration  4 weeks    PT Treatment/Interventions  ADLs/Self Care Home Management;Cryotherapy;Electrical Stimulation;DME Instruction;Gait training;Stair training;Functional mobility training;Therapeutic activities;Moist Heat;Therapeutic exercise;Balance training;Neuromuscular re-education;Patient/family education;Orthotic Fit/Training;Manual techniques;Compression bandaging;Scar mobilization;Passive range of motion;Dry needling;Energy conservation;Taping    PT Next Visit Plan  Continue to progress LT knee AROM as tolerated. Work on normalizing gait, continued quad strengthening as able.    PT Home Exercise Plan  Supine heel slides, quad sets, SAQ 10x 1-2 x/day. portal site mobilization, patella mobilization, heel slides with foot on wall Prone quadriceps stretch 3x30'' dail, self massage, butterfly stretch; 07/25/19: SLR 2-3x6 repetitions 1x/day    Consulted and Agree with Plan of Care  Patient       Patient will benefit from skilled therapeutic intervention in order to improve the  following deficits and impairments:  Abnormal gait, Increased fascial restricitons, Pain, Decreased mobility, Decreased activity tolerance, Decreased endurance, Decreased range of motion, Decreased strength, Hypomobility, Decreased balance, Difficulty walking, Increased edema, Impaired flexibility  Visit Diagnosis: Stiffness of left knee, not elsewhere classified  Acute pain of left knee  Other symptoms and signs involving the musculoskeletal system  Muscle weakness (generalized)  Other abnormalities of gait and mobility     Problem List Patient Active Problem List   Diagnosis Date Noted  . Iron deficiency anemia 06/20/2019  . S/P left TKA 06/06/2019  . Status post total left knee replacement 06/06/2019  . Special  screening for malignant neoplasms, colon 03/28/2019  . Acute diverticulitis 09/26/2018  . LLQ abdominal pain 09/26/2018  . Acute Sigmoid diverticulitis 09/26/2018  . Derangement of anterior horn of lateral meniscus of left knee   . Derangement of posterior horn of medial meniscus of left knee   . Chest pain 04/25/2016  . Hypokalemia 04/25/2016  . Hyperglycemia 04/25/2016  . Diverticulitis of colon 07/17/2014  . Nausea without vomiting 07/17/2014  . Crohn disease ([place]) 07/17/2014  . Diverticulitis of colon without hemorrhage 08/10/2013  . History of arthroscopy of right knee 07/25/2012  . Difficulty in walking(719.7) 06/29/2012  . Weakness of right leg 06/29/2012  . Posterior tibial tendonitis 11/11/2011  . PTTD (posterior tibial tendon dysfunction) 11/11/2011  . Knee pain, right 11/11/2011  . Chest pain 07/16/2011  . Primary osteoarthritis of left knee 02/25/2010  . PAIN IN JOINT, MULTIPLE SITES 02/25/2010  . Essential hypertension 08/13/2009  . HIATAL HERNIA 08/13/2009  . PALPITATIONS 08/13/2009  . DYSPNEA 08/13/2009    4:21 PM, 08/17/19 Mearl Latin PT, DPT Physical Therapist at [place], [place], [address] Phone: [phone]   Fax:  [phone]  Name: [name] MRN: [id number] Date of Birth: 1965/10/25

## 2019-08-17 NOTE — Addendum Note (Signed)
Addended by: Mearl Latin on: 08/17/2019 04:35 PM   Modules accepted: Orders

## 2019-08-22 ENCOUNTER — Encounter (HOSPITAL_COMMUNITY): Payer: Self-pay | Admitting: Physical Therapy

## 2019-08-22 ENCOUNTER — Telehealth (HOSPITAL_COMMUNITY): Payer: Self-pay | Admitting: Physical Therapy

## 2019-08-22 NOTE — Telephone Encounter (Signed)
called to cancel today's appt had death in her family.

## 2019-08-24 ENCOUNTER — Encounter (HOSPITAL_COMMUNITY): Payer: Self-pay | Admitting: Physical Therapy

## 2019-08-24 ENCOUNTER — Other Ambulatory Visit: Payer: Self-pay

## 2019-08-24 ENCOUNTER — Ambulatory Visit (HOSPITAL_COMMUNITY): Payer: PRIVATE HEALTH INSURANCE | Admitting: Physical Therapy

## 2019-08-24 DIAGNOSIS — M6281 Muscle weakness (generalized): Secondary | ICD-10-CM

## 2019-08-24 DIAGNOSIS — M25562 Pain in left knee: Secondary | ICD-10-CM

## 2019-08-24 DIAGNOSIS — M25662 Stiffness of left knee, not elsewhere classified: Secondary | ICD-10-CM | POA: Diagnosis not present

## 2019-08-24 DIAGNOSIS — R2689 Other abnormalities of gait and mobility: Secondary | ICD-10-CM | POA: Diagnosis not present

## 2019-08-24 DIAGNOSIS — R29898 Other symptoms and signs involving the musculoskeletal system: Secondary | ICD-10-CM | POA: Diagnosis not present

## 2019-08-24 NOTE — Therapy (Signed)
[place], [place], [address] Phone: [phone]   Fax:  [phone]  Physical Therapy Treatment  Patient Details  Name: [name] MRN: [id number] Date of Birth: 11-18-1965 Referring Provider (PT): Paralee Cancel, MD   Encounter Date: 08/24/2019  PT End of Session - 08/24/19 1316    Visit Number  23    Number of Visits  32    Date for PT Re-Evaluation  09/14/19    Authorization Type  Workman's Comp (1 eval and 16 treatment sessions approved; 12 visits approved 07/20/19) additional workmans comp visits will need approved 08/17/2019    Authorization Time Period  06/08/19 - 07/20/19; 07/20/19-08/17/19; 08/17/2019-09/14/2019    Authorization - Visit Number  23    Authorization - Number of Visits  29    PT Start Time  8502    PT Stop Time  1330   patient had to leave for another appointment   PT Time Calculation (min)  25 min    Equipment Utilized During Treatment  None   Activity Tolerance  Patient tolerated treatment well    Behavior During Therapy  [facility] for tasks assessed/performed       Past Medical History:  Diagnosis Date  . Anxiety   . Arthritis   . Asthma   . Cervical hyperplasia    hisotry of  . Chest pain    cath 2005 norm cors, repeat cath Feb 2011 normal cors and only minimally  elevated pulmonary pressures  . Crohn disease ([place])   . Diverticulosis   . GERD (gastroesophageal reflux disease)   . Hiatal hernia   . History of iron deficiency anemia   . Hypertension   . Migraines   . Obesity   . Palpitations   . Pneumonia 2008   14 days hospitalization, bilateral  . PONV (postoperative nausea and vomiting)   . Sleep apnea sleep study 11/03/2009    cpap; does sometimes, doesnt use every night. weight loss no longer using CPAP    Past Surgical History:  Procedure Laterality Date  . ABDOMINAL HYSTERECTOMY    . ANKLE ARTHROSCOPY  06/01/2012   Procedure: ANKLE ARTHROSCOPY;  Surgeon: Colin Rhein, MD;  Location: [place];  Service: Orthopedics;  Laterality: Left;  left ankle arthorsocpy with extensive debridement and gastroc slide  . ANKLE SURGERY Left 05/11/2018  . BIOPSY  04/26/2019   Procedure: BIOPSY;  Surgeon: Rogene Houston, MD;  [facility];  Service: Endoscopy;;  ileum colon  . CARDIAC CATHETERIZATION  11/22/2009 and 2005   WNL  . CESAREAN SECTION    . CHOLECYSTECTOMY  09/08/2006   lap. chole.  . CHONDROPLASTY  06/17/2012   Procedure: CHONDROPLASTY;  Surgeon: Carole Civil, MD;  Location: [facility];  Service: Orthopedics;  Laterality: Right;  right patella  . COLONOSCOPY N/A 04/26/2019   Procedure: COLONOSCOPY;  Surgeon: Rogene Houston, MD;  [facility];  Service: Endoscopy;  Laterality: N/A;  100  . ESOPHAGOGASTRODUODENOSCOPY N/A 05/14/2015   Procedure: ESOPHAGOGASTRODUODENOSCOPY (EGD);  Surgeon: Rogene Houston, MD;  [facility];  Service: Endoscopy;  Laterality: N/A;  730  . GIVENS CAPSULE STUDY N/A 05/24/2019   Procedure: GIVENS CAPSULE STUDY;  Surgeon: Rogene Houston, MD;  [facility];  Service: Endoscopy;  Laterality: N/A;  7:30  . INGUINAL HERNIA REPAIR  10/30/2008   right  . KNEE ARTHROSCOPY Right 2013  . KNEE ARTHROSCOPY WITH LATERAL  MENISECTOMY Left 12/23/2016   Procedure: LEFT KNEE ARTHROSCOPY WITH LATERAL MENISECTOMY;  Surgeon: Carole Civil, MD;  Location: [facility];  Service: Orthopedics;  Laterality: Left;  . KNEE ARTHROSCOPY WITH MEDIAL MENISECTOMY Left 12/23/2016   Procedure: LEFT KNEE ARTHROSCOPY WITH MEDIAL MENISECTOMY CHONDROPLASTY PATELLA  AND MEDIAL FEMORAL CONDYLE LEFT KNEE;  Surgeon: Carole Civil, MD;  Location: [facility];  Service: Orthopedics;  Laterality: Left;  . SHOULDER ARTHROSCOPY WITH SUBACROMIAL DECOMPRESSION AND BICEP TENDON REPAIR Left 12/21/2017   Procedure: Left shoulder arthroscopic biceps tenodesis, SAD, DCR and labrum debridement;  Surgeon: Nicholes Stairs,  MD;  Location: [place];  Service: Orthopedics;  Laterality: Left;  120 mins  . SHOULDER SURGERY     right x 2   . TOTAL KNEE ARTHROPLASTY Left 06/06/2019   Procedure: TOTAL KNEE ARTHROPLASTY;  Surgeon: Paralee Cancel, MD;  Location: [facility];  Service: Orthopedics;  Laterality: Left;  70 mins    There were no vitals filed for this visit.  Subjective Assessment - 08/24/19 1315    Subjective  Patient states she has been on her feet a lot due to planning a funeral for her brother and her knee has been sore and stiff lately. She notices herself limping a lot the last few days. She has not been able to perform many exercises.    Pertinent History  Left TKA 06/06/19, see above for further surgical history    Limitations  Standing;Walking;House hold activities    How long can you sit comfortably?  Not limited    Patient Stated Goals  Improving walking    Currently in Pain?  Yes    Pain Score  4     Pain Location  Knee    Pain Onset  1 to 4 weeks ago                       [facility] Adult PT Treatment/Exercise - 08/24/19 0001      Ambulation/Gait   Ambulation/Gait  Yes    Ambulation/Gait Assistance  7: Independent    Ambulation Distance (Feet)  450 Feet    Gait Pattern  Decreased stance time - left;Decreased step length - right;Trendelenburg;Antalgic    Gait Comments  frequent cueing to limit antalgic gait/ truncal lean      Knee/Hip Exercises: Machines for Strengthening   Cybex Leg Press  2 plates, 4x 10 reps, focus on eccentric, Single leg      Manual Therapy   Manual Therapy  Soft tissue mobilization;Joint mobilization    Manual therapy comments  All manual completed separately from other skilled interventions    Joint Mobilization  LT patellar mobs in all planes grade II for decreased pain and improved knee AROM     Soft tissue mobilization  STM to LT distal quadriceps, adductors, hamstrings             PT Education - 08/24/19 1316    Education Details  patietn educated  on continueing HEP    Person(s) Educated  Patient    Methods  Explanation    Comprehension  Verbalized understanding       PT Short Term Goals - 07/20/19 1825      PT SHORT TERM GOAL #1   Title  Patient will demonstrate understanding and report regular compliance with HEP to improve knee AROM, lower extremity strength, and overall functional mobility.    Time  3    Period  Weeks    Status  Achieved  Target Date  06/29/19      PT SHORT TERM GOAL #2   Title  Patient will demonstrate left knee extension/flexion active range of motion of at least 5-95 degrees to assist with more normalized gait pattern and stair ambulation.    Time  3    Period  Weeks    Status  Achieved    Target Date  06/29/19      PT SHORT TERM GOAL #3   Title  Patient will demonstrate improvement of 1/2 MMT grade in all deficient musculature to assist with proper gait mechanics.     Baseline  06/28/19: Strength still deficient with functional strength assessment    Time  3    Period  Weeks    Status  Achieved    Target Date  06/29/19        PT Long Term Goals - 08/17/19 1314      PT LONG TERM GOAL #1   Title  Patient will perform single limb stance on left/right lower extremity for 3 seconds in order to assist with stair ambulation.    Baseline  07/20/19- L SLS 24 sec;R SLS 14 sec    Time  6    Period  Weeks    Status  Achieved      PT LONG TERM GOAL #2   Title  Patient will demonstrate improvement of 1 MMT grade in all musculature tested as deficient at evaluation to improve gait mechanics and safety.    Baseline  06/28/19: Strength still deficient with functional strength assessment    Time  6    Period  Weeks    Status  Achieved      PT LONG TERM GOAL #3   Title  Patient will improve ROM for left knee extension/flexion to 0-115 degrees to improve squatting, and other functional mobility.    Baseline  06/28/19: Left knee AROM 1-105 degrees; 07/20/19- 1-115 degrees; 08/17/19 1-0-118    Time  4     Period  Weeks    Status  Achieved      PT LONG TERM GOAL #4   Title  Patient will demonstrate ability to perform at least 8 sit to stands on the 30 second chair rise indicating improved balance and functional mobility.    Baseline  07/20/19- 45 seconds    Time  4    Period  Weeks    Status  On-going      PT LONG TERM GOAL #5   Title  Patient will report ability to ambulate for 10 minutes with least restrictive assistive device with no greater than 3/10 pain.    Time  4    Period  Weeks    Status  On-going      PT LONG TERM GOAL #6   Title  Patient will demonstrate ability to ambulate at a gait velocity of at least 0.8 m/s with LRAD on 2MWT indicating improved ability to ambulate safely household and limited community distances.    Baseline  07/20/19- .95 m/s    Time  6    Period  Weeks    Status  Achieved            Plan - 08/24/19 1318    Clinical Impression Statement  Patient ambulates with limited knee flexion/extension ROM causing antalgic gait pattern along with Trendelenburg secondary to impaired hip abductor strength on LLE. She is unable to complete bike for ROM secondary to painful R knee and limited ROM. She feels  minimal improvement in knee symptoms following manual therapy interventions. She is able to complete leg press and fatigues quickly. She continues to demonstrate impaired quad strength and remains limited by c/o stiffness. Patient educated on completion of HEP in order to promote LE strengthening and endurance. Patient will continue to benefit from skilled physical therapy in order to reduce impairment and improve function.    Personal Factors and Comorbidities  Past/Current Experience    Examination-Activity Limitations  Stand;Locomotion Level;Bed Mobility;Bend;Transfers;Stairs;Squat    Examination-Participation Restrictions  Community Activity    Stability/Clinical Decision Making  Stable/Uncomplicated    Rehab Potential  Good    PT Frequency  2x / week     PT Duration  4 weeks    PT Treatment/Interventions  ADLs/Self Care Home Management;Cryotherapy;Electrical Stimulation;DME Instruction;Gait training;Stair training;Functional mobility training;Therapeutic activities;Moist Heat;Therapeutic exercise;Balance training;Neuromuscular re-education;Patient/family education;Orthotic Fit/Training;Manual techniques;Compression bandaging;Scar mobilization;Passive range of motion;Dry needling;Energy conservation;Taping    PT Next Visit Plan  Continue to progress LT knee AROM as tolerated. Work on normalizing gait, continued quad strengthening as able.    PT Home Exercise Plan  Supine heel slides, quad sets, SAQ 10x 1-2 x/day. portal site mobilization, patella mobilization, heel slides with foot on wall Prone quadriceps stretch 3x30'' dail, self massage, butterfly stretch; 07/25/19: SLR 2-3x6 repetitions 1x/day    Consulted and Agree with Plan of Care  Patient       Patient will benefit from skilled therapeutic intervention in order to improve the following deficits and impairments:  Abnormal gait, Increased fascial restricitons, Pain, Decreased mobility, Decreased activity tolerance, Decreased endurance, Decreased range of motion, Decreased strength, Hypomobility, Decreased balance, Difficulty walking, Increased edema, Impaired flexibility  Visit Diagnosis: Stiffness of left knee, not elsewhere classified  Acute pain of left knee  Other symptoms and signs involving the musculoskeletal system  Muscle weakness (generalized)  Other abnormalities of gait and mobility     Problem List Patient Active Problem List   Diagnosis Date Noted  . Iron deficiency anemia 06/20/2019  . S/P left TKA 06/06/2019  . Status post total left knee replacement 06/06/2019  . Special screening for malignant neoplasms, colon 03/28/2019  . Acute diverticulitis 09/26/2018  . LLQ abdominal pain 09/26/2018  . Acute Sigmoid diverticulitis 09/26/2018  . Derangement of anterior  horn of lateral meniscus of left knee   . Derangement of posterior horn of medial meniscus of left knee   . Chest pain 04/25/2016  . Hypokalemia 04/25/2016  . Hyperglycemia 04/25/2016  . Diverticulitis of colon 07/17/2014  . Nausea without vomiting 07/17/2014  . Crohn disease ([place]) 07/17/2014  . Diverticulitis of colon without hemorrhage 08/10/2013  . History of arthroscopy of right knee 07/25/2012  . Difficulty in walking(719.7) 06/29/2012  . Weakness of right leg 06/29/2012  . Posterior tibial tendonitis 11/11/2011  . PTTD (posterior tibial tendon dysfunction) 11/11/2011  . Knee pain, right 11/11/2011  . Chest pain 07/16/2011  . Primary osteoarthritis of left knee 02/25/2010  . PAIN IN JOINT, MULTIPLE SITES 02/25/2010  . Essential hypertension 08/13/2009  . HIATAL HERNIA 08/13/2009  . PALPITATIONS 08/13/2009  . DYSPNEA 08/13/2009    2:50 PM, 08/24/19 Mearl Latin PT, DPT Physical Therapist at [place], [place], [address] Phone: [phone]   Fax:  [phone]  Name: [name] MRN: [id number] Date of Birth: September 04, 1966

## 2019-09-05 ENCOUNTER — Other Ambulatory Visit: Payer: Self-pay

## 2019-09-05 DIAGNOSIS — Z20822 Contact with and (suspected) exposure to covid-19: Secondary | ICD-10-CM

## 2019-09-07 LAB — NOVEL CORONAVIRUS, NAA: SARS-CoV-2, NAA: NOT DETECTED

## 2019-09-20 ENCOUNTER — Other Ambulatory Visit: Payer: Self-pay

## 2019-09-20 ENCOUNTER — Encounter (HOSPITAL_COMMUNITY): Payer: Self-pay | Admitting: Physical Therapy

## 2019-09-20 ENCOUNTER — Ambulatory Visit (HOSPITAL_COMMUNITY): Payer: PRIVATE HEALTH INSURANCE | Attending: Orthopedic Surgery | Admitting: Physical Therapy

## 2019-09-20 DIAGNOSIS — R2689 Other abnormalities of gait and mobility: Secondary | ICD-10-CM | POA: Diagnosis not present

## 2019-09-20 DIAGNOSIS — M6281 Muscle weakness (generalized): Secondary | ICD-10-CM | POA: Insufficient documentation

## 2019-09-20 DIAGNOSIS — M25662 Stiffness of left knee, not elsewhere classified: Secondary | ICD-10-CM | POA: Insufficient documentation

## 2019-09-20 DIAGNOSIS — R29898 Other symptoms and signs involving the musculoskeletal system: Secondary | ICD-10-CM | POA: Diagnosis not present

## 2019-09-20 DIAGNOSIS — M25562 Pain in left knee: Secondary | ICD-10-CM | POA: Diagnosis not present

## 2019-09-20 NOTE — Addendum Note (Signed)
Addended by: Clarene Critchley on: 09/20/2019 02:24 PM   Modules accepted: Orders

## 2019-09-20 NOTE — Therapy (Addendum)
[place], [place], [address] Phone: [phone]   Fax:  [phone]  Physical Therapy Treatment  Patient Details  Name: [name] MRN: [id number] Date of Birth: 1966/06/21 Referring Provider (PT): Paralee Cancel, MD   Encounter Date: 09/20/2019   PHYSICAL THERAPY DISCHARGE SUMMARY  Visits from Start of Care: 24  Current functional level related to goals / functional outcomes: See below  Remaining deficits: See below   Education / Equipment: HEP, see below Plan: Patient agrees to discharge.  Patient goals were met. Patient is being discharged due to meeting the stated rehab goals.  ?????       PT End of Session - 09/20/19 1351    Visit Number  24    Number of Visits  32    Date for PT Re-Evaluation  09/14/19    Authorization Type  Workman's Comp (1 eval and 16 treatment sessions approved; 12 visits approved 07/20/19) additional workmans comp visits will need approved 08/17/2019    Authorization Time Period  06/08/19 - 07/20/19; 07/20/19-08/17/19; 08/17/2019-09/14/2019    Authorization - Visit Number  24    Authorization - Number of Visits  29    PT Start Time  0349    PT Stop Time  1344   Discharge   PT Time Calculation (min)  30 min    Activity Tolerance  Patient tolerated treatment well    Behavior During Therapy  WFL for tasks assessed/performed       Past Medical History:  Diagnosis Date  . Anxiety   . Arthritis   . Asthma   . Cervical hyperplasia    hisotry of  . Chest pain    cath 2005 norm cors, repeat cath Feb 2011 normal cors and only minimally  elevated pulmonary pressures  . Crohn disease ([place])   . Diverticulosis   . GERD (gastroesophageal reflux disease)   . Hiatal hernia   . History of iron deficiency anemia   . Hypertension   . Migraines   . Obesity   . Palpitations   . Pneumonia 2008   14 days hospitalization, bilateral  . PONV (postoperative nausea and vomiting)    . Sleep apnea sleep study 11/03/2009    cpap; does sometimes, doesnt use every night. weight loss no longer using CPAP    Past Surgical History:  Procedure Laterality Date  . ABDOMINAL HYSTERECTOMY    . ANKLE ARTHROSCOPY  06/01/2012   Procedure: ANKLE ARTHROSCOPY;  Surgeon: Colin Rhein, MD;  Location: [facility];  Service: Orthopedics;  Laterality: Left;  left ankle arthorsocpy with extensive debridement and gastroc slide  . ANKLE SURGERY Left 05/11/2018  . BIOPSY  04/26/2019   Procedure: BIOPSY;  Surgeon: Rogene Houston, MD;  [facility];  Service: Endoscopy;;  ileum colon  . CARDIAC CATHETERIZATION  11/22/2009 and 2005   WNL  . CESAREAN SECTION    . CHOLECYSTECTOMY  09/08/2006   lap. chole.  . CHONDROPLASTY  06/17/2012   Procedure: CHONDROPLASTY;  Surgeon: Carole Civil, MD;  Location: [facility];  Service: Orthopedics;  Laterality: Right;  right patella  . COLONOSCOPY N/A 04/26/2019   Procedure: COLONOSCOPY;  Surgeon: Rogene Houston, MD;  [facility];  Service: Endoscopy;  Laterality: N/A;  100  . ESOPHAGOGASTRODUODENOSCOPY N/A 05/14/2015   Procedure: ESOPHAGOGASTRODUODENOSCOPY (EGD);  Surgeon: Rogene Houston, MD;  [facility];  Service: Endoscopy;  Laterality: N/A;  730  .  GIVENS CAPSULE STUDY N/A 05/24/2019   Procedure: GIVENS CAPSULE STUDY;  Surgeon: Rogene Houston, MD;  [facility];  Service: Endoscopy;  Laterality: N/A;  7:30  . INGUINAL HERNIA REPAIR  10/30/2008   right  . KNEE ARTHROSCOPY Right 2013  . KNEE ARTHROSCOPY WITH LATERAL MENISECTOMY Left 12/23/2016   Procedure: LEFT KNEE ARTHROSCOPY WITH LATERAL MENISECTOMY;  Surgeon: Carole Civil, MD;  Location: [facility];  Service: Orthopedics;  Laterality: Left;  . KNEE ARTHROSCOPY WITH MEDIAL MENISECTOMY Left 12/23/2016   Procedure: LEFT KNEE ARTHROSCOPY WITH MEDIAL MENISECTOMY CHONDROPLASTY PATELLA  AND MEDIAL FEMORAL CONDYLE LEFT KNEE;  Surgeon: Carole Civil, MD;  Location: [facility];  Service: Orthopedics;  Laterality: Left;  . SHOULDER ARTHROSCOPY WITH SUBACROMIAL DECOMPRESSION AND BICEP TENDON REPAIR Left 12/21/2017   Procedure: Left shoulder arthroscopic biceps tenodesis, SAD, DCR and labrum debridement;  Surgeon: Nicholes Stairs, MD;  Location: [facility];  Service: Orthopedics;  Laterality: Left;  120 mins  . SHOULDER SURGERY     right x 2   . TOTAL KNEE ARTHROPLASTY Left 06/06/2019   Procedure: TOTAL KNEE ARTHROPLASTY;  Surgeon: Paralee Cancel, MD;  Location: [facility];  Service: Orthopedics;  Laterality: Left;  70 mins    There were no vitals filed for this visit.  Subjective Assessment - 09/20/19 1349    Subjective  Patient reported feeling good and feels ready to continue therapy on her own at home. Reported decreased pain and improved ability to walk.    Pertinent History  Left TKA 06/06/19, see above for further surgical history    Limitations  Standing;Walking;House hold activities    How long can you sit comfortably?  Not limited    How long can you walk comfortably?  15 minutes    Patient Stated Goals  Improving walking    Currently in Pain?  Yes    Pain Score  3     Pain Location  Knee    Pain Orientation  Left    Pain Descriptors / Indicators  Aching    Pain Type  Surgical pain    Pain Onset  More than a month ago         [facility] PT Assessment - 09/20/19 0001      Assessment   Medical Diagnosis  S/P Left TKA    Referring Provider (PT)  Paralee Cancel, MD    Next MD Visit  10/04/19    Prior Therapy  Yes for ankle      Prior Function   Level of Independence  Independent;Independent with basic ADLs      Strength   Left Knee Flexion  5/5    Left Knee Extension  5/5      Transfers   Five time sit to stand comments   30 second chair rise: 9 repetitions      Ambulation/Gait   Gait Comments  Ambulating without assistive device                           PT Education - 09/20/19 1351    Education  Details  Discussed assessment findings and updated HEP.    Person(s) Educated  Patient    Methods  Explanation;Handout    Comprehension  Verbalized understanding       PT Short Term Goals - 07/20/19 1825      PT SHORT TERM GOAL #1   Title  Patient will demonstrate understanding and report regular compliance  with HEP to improve knee AROM, lower extremity strength, and overall functional mobility.    Time  3    Period  Weeks    Status  Achieved    Target Date  06/29/19      PT SHORT TERM GOAL #2   Title  Patient will demonstrate left knee extension/flexion active range of motion of at least 5-95 degrees to assist with more normalized gait pattern and stair ambulation.    Time  3    Period  Weeks    Status  Achieved    Target Date  06/29/19      PT SHORT TERM GOAL #3   Title  Patient will demonstrate improvement of 1/2 MMT grade in all deficient musculature to assist with proper gait mechanics.     Baseline  06/28/19: Strength still deficient with functional strength assessment    Time  3    Period  Weeks    Status  Achieved    Target Date  06/29/19        PT Long Term Goals - 09/20/19 1356      PT LONG TERM GOAL #1   Title  Patient will perform single limb stance on left/right lower extremity for 3 seconds in order to assist with stair ambulation.    Baseline  07/20/19- L SLS 24 sec;R SLS 14 sec    Time  6    Period  Weeks    Status  Achieved      PT LONG TERM GOAL #2   Title  Patient will demonstrate improvement of 1 MMT grade in all musculature tested as deficient at evaluation to improve gait mechanics and safety.    Baseline  06/28/19: Strength still deficient with functional strength assessment    Time  6    Period  Weeks    Status  Achieved      PT LONG TERM GOAL #3   Title  Patient will improve ROM for left knee extension/flexion to 0-115 degrees to improve squatting, and other functional mobility.    Baseline  06/28/19: Left knee AROM 1-105 degrees; 07/20/19-  1-115 degrees; 08/17/19 1-0-118    Time  4    Period  Weeks    Status  Achieved      PT LONG TERM GOAL #4   Title  Patient will demonstrate ability to perform at least 8 sit to stands on the 30 second chair rise indicating improved balance and functional mobility.    Baseline  09/20/19: 9 repetitions on 30 second chair rise    Time  4    Period  Weeks    Status  Achieved      PT LONG TERM GOAL #5   Title  Patient will report ability to ambulate for 10 minutes with least restrictive assistive device with no greater than 3/10 pain.    Baseline  09/20/19: Ambulating without cane at least 15 minutes    Time  4    Period  Weeks    Status  Achieved      PT LONG TERM GOAL #6   Title  Patient will demonstrate ability to ambulate at a gait velocity of at least 0.8 m/s with LRAD on 2MWT indicating improved ability to ambulate safely household and limited community distances.    Baseline  07/20/19- .95 m/s    Time  6    Period  Weeks    Status  Achieved  Plan - 09/20/19 1410    Clinical Impression Statement  Performed re-assessment of patient's progress towards goals. Patient had achieved remaining goals. Patient reported feeling confident to continue exercises at home. Educated patient on exercises to continue progressing strength and balance at home. Discussed with patient that she would be discharged at this time, but if she had any concerns in the future she could get a new referral from her MD.    Personal Factors and Comorbidities  Past/Current Experience    Examination-Activity Limitations  Stand;Locomotion Level;Bed Mobility;Bend;Transfers;Stairs;Squat    Examination-Participation Restrictions  Community Activity    Stability/Clinical Decision Making  Stable/Uncomplicated    Rehab Potential  Good    PT Frequency  2x / week    PT Duration  4 weeks    PT Treatment/Interventions  ADLs/Self Care Home Management;Cryotherapy;Electrical Stimulation;DME Instruction;Gait  training;Stair training;Functional mobility training;Therapeutic activities;Moist Heat;Therapeutic exercise;Balance training;Neuromuscular re-education;Patient/family education;Orthotic Fit/Training;Manual techniques;Compression bandaging;Scar mobilization;Passive range of motion;Dry needling;Energy conservation;Taping    PT Next Visit Plan  Discharged    PT Home Exercise Plan  Supine heel slides, quad sets, SAQ 10x 1-2 x/day. portal site mobilization, patella mobilization, heel slides with foot on wall Prone quadriceps stretch 3x30'' dail, self massage, butterfly stretch; 07/25/19: SLR 2-3x6 repetitions 1x/day    Consulted and Agree with Plan of Care  Patient       Patient will benefit from skilled therapeutic intervention in order to improve the following deficits and impairments:  Abnormal gait, Increased fascial restricitons, Pain, Decreased mobility, Decreased activity tolerance, Decreased endurance, Decreased range of motion, Decreased strength, Hypomobility, Decreased balance, Difficulty walking, Increased edema, Impaired flexibility  Visit Diagnosis: Stiffness of left knee, not elsewhere classified  Acute pain of left knee  Muscle weakness (generalized)  Other symptoms and signs involving the musculoskeletal system  Other abnormalities of gait and mobility     Problem List Patient Active Problem List   Diagnosis Date Noted  . Iron deficiency anemia 06/20/2019  . S/P left TKA 06/06/2019  . Status post total left knee replacement 06/06/2019  . Special screening for malignant neoplasms, colon 03/28/2019  . Acute diverticulitis 09/26/2018  . LLQ abdominal pain 09/26/2018  . Acute Sigmoid diverticulitis 09/26/2018  . Derangement of anterior horn of lateral meniscus of left knee   . Derangement of posterior horn of medial meniscus of left knee   . Chest pain 04/25/2016  . Hypokalemia 04/25/2016  . Hyperglycemia 04/25/2016  . Diverticulitis of colon 07/17/2014  . Nausea  without vomiting 07/17/2014  . Crohn disease ([place]) 07/17/2014  . Diverticulitis of colon without hemorrhage 08/10/2013  . History of arthroscopy of right knee 07/25/2012  . Difficulty in walking(719.7) 06/29/2012  . Weakness of right leg 06/29/2012  . Posterior tibial tendonitis 11/11/2011  . PTTD (posterior tibial tendon dysfunction) 11/11/2011  . Knee pain, right 11/11/2011  . Chest pain 07/16/2011  . Primary osteoarthritis of left knee 02/25/2010  . PAIN IN JOINT, MULTIPLE SITES 02/25/2010  . Essential hypertension 08/13/2009  . HIATAL HERNIA 08/13/2009  . PALPITATIONS 08/13/2009  . DYSPNEA 08/13/2009   Clarene Critchley PT, DPT 2:16 PM, 09/20/19 [place], [place], [address] Phone: [phone]   Fax:  [phone]  Name: EMILIE CARP MRN: [id number] Date of Birth: 06/21/1966

## 2019-09-20 NOTE — Patient Instructions (Signed)
Abduction: Clam (Eccentric) - Side-Lying    Lie on side with knees bent. Lift top knee, keeping feet together. Keep trunk steady. Slowly lower for 3-5 seconds. _15__ reps per set, _1__ sets per day, _7__ days per week.   http://ecce.exer.us/65   Copyright  VHI. All rights reserved.  (Home) Squat: (Assist)    Using supports, arms close to body, squat by dropping hips back as if sitting in a chair. Repeat _10___ times per set. Do _1-2___ sets per session. Do __7__ sessions per week.  Copyright  VHI. All rights reserved.  Long [employer]    Straighten operated leg and try to hold it _5___ seconds. Can increase weight on ankle. Repeat __10__ times. Do _1-2___ sessions a day.  http://gt2.exer.us/311   Copyright  VHI. All rights reserved.  HIP / KNEE: Extension - Standing (Band)    Place band around leg. Squeeze glutes. Raise and lift leg backward. Keep knee straight. Hold _3__ seconds. Use ___red_____ band. _10__ reps per set, _1-2__ sets per day, _7__ days per week Hold onto a support.  Copyright  VHI. All rights reserved.  ABDUCTION: Standing (Power)    Stand, feet flat. Lift right leg out to side as quickly as possible. Use Red theraband around ankles Complete _1-2__ sets of __10_ repetitions. Perform __1_ sessions per day.  Copyright  VHI. All rights reserved.  Single Leg - Eyes Open    Holding support, lift right leg while maintaining balance over other leg. Progress to removing hands from support surface for longer periods of time. Hold__up to 30__ seconds. Repeat __3__ times per session. Do __1-2__ sessions per day.  Copyright  VHI. All rights reserved.

## 2019-09-21 ENCOUNTER — Ambulatory Visit (HOSPITAL_COMMUNITY): Payer: PRIVATE HEALTH INSURANCE | Admitting: Physical Therapy

## 2019-09-23 MED FILL — FAMOTIDINE 40 MG TABLET: 40 | 30 days supply | Qty: 30 | Fill #1

## 2019-09-25 MED FILL — CYCLOBENZAPRINE HCL 10 MG T: 10 | 10 days supply | Qty: 30 | Fill #0

## 2019-09-26 ENCOUNTER — Encounter (HOSPITAL_COMMUNITY): Payer: 59

## 2019-09-30 IMAGING — DX DG SHOULDER 2+V*L*
3 series · 3 of 3 positions shown · non-contrast
Comparison: None.

CLINICAL DATA: Fall with pain

EXAM:
LEFT SHOULDER - 2+ VIEW

[shoulder grashey]
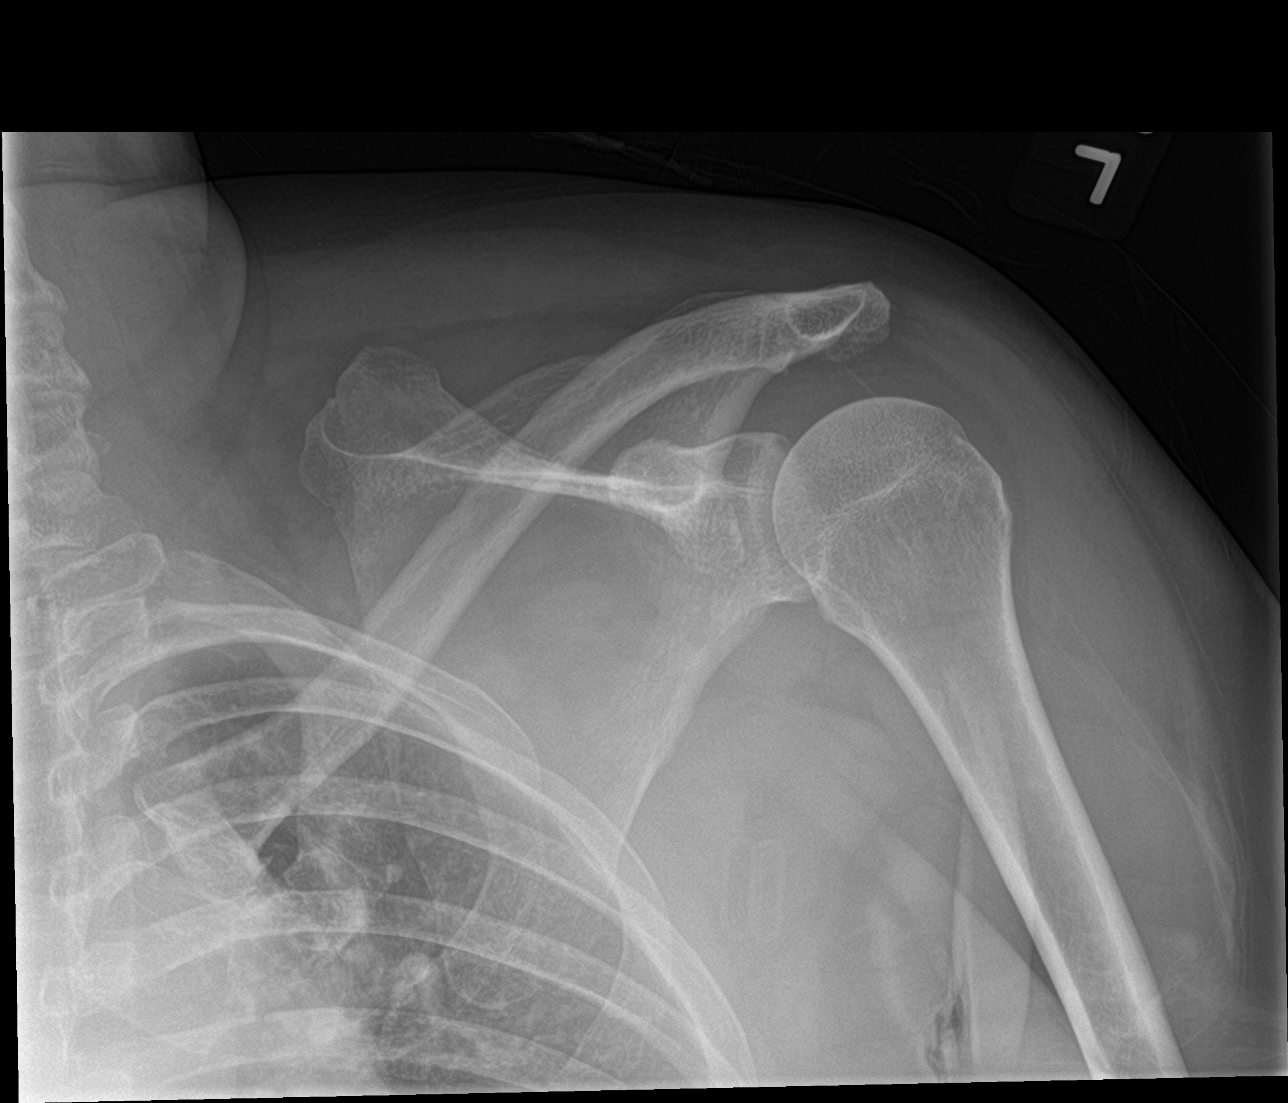

[shoulder y view]
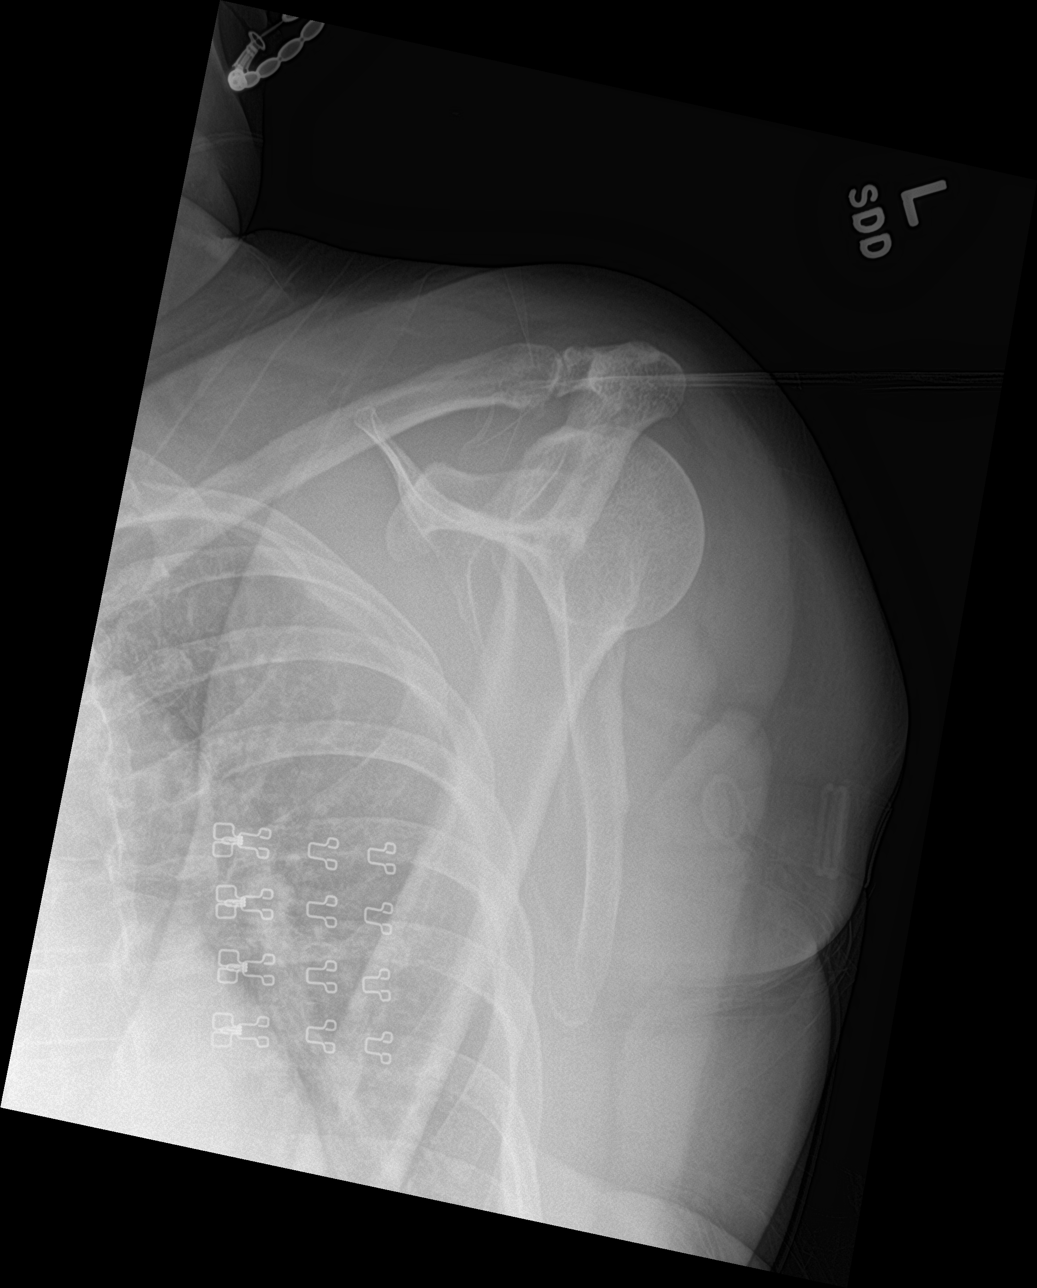

[shoulder axillary]
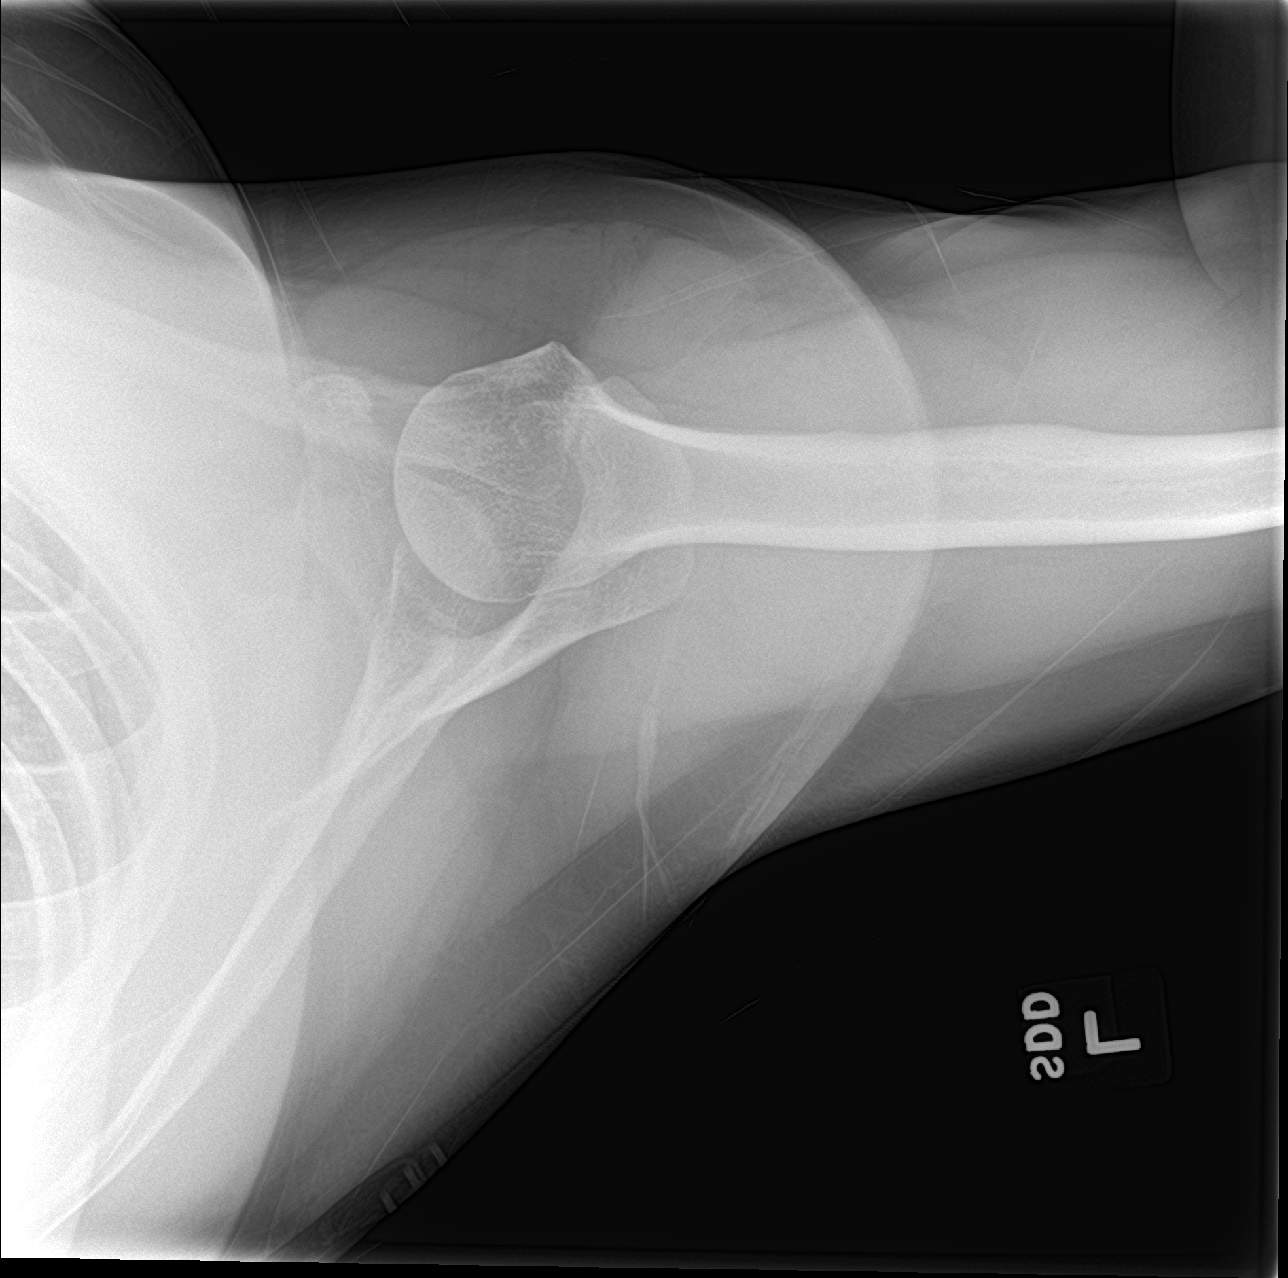

[3 of 3 positions shown; findings below may reference images not displayed]

FINDINGS: No fracture or dislocation.  The left lung apex is clear.
IMPRESSION: No acute osseous abnormality.

## 2019-10-02 MED FILL — LOSARTAN POTASSIUM 100 MG T: 100 | 30 days supply | Qty: 30 | Fill #3

## 2019-10-24 ENCOUNTER — Other Ambulatory Visit: Payer: Self-pay | Admitting: Orthopedic Surgery

## 2019-10-24 MED FILL — CYCLOBENZAPRINE HCL 10 MG T: 10 | 10 days supply | Qty: 30 | Fill #1

## 2019-10-24 MED FILL — AMLODIPINE BESYLATE 5 MG TA: 5 | 90 days supply | Qty: 90 | Fill #0

## 2019-10-24 MED FILL — IBUPROFEN 800 MG TAB: 800 | 30 days supply | Qty: 90 | Fill #0

## 2019-10-31 MED FILL — LOSARTAN POTASSIUM 100 MG T: 100 | 30 days supply | Qty: 30 | Fill #4

## 2019-10-31 MED FILL — GABAPENTIN 300 MG CAPSULE: 300 | 90 days supply | Qty: 90 | Fill #1

## 2019-11-07 ENCOUNTER — Other Ambulatory Visit: Payer: Self-pay

## 2019-11-07 ENCOUNTER — Ambulatory Visit: Payer: 59 | Attending: Internal Medicine

## 2019-11-07 DIAGNOSIS — Z20822 Contact with and (suspected) exposure to covid-19: Secondary | ICD-10-CM | POA: Diagnosis not present

## 2019-11-08 LAB — NOVEL CORONAVIRUS, NAA: SARS-CoV-2, NAA: NOT DETECTED

## 2019-11-15 MED FILL — AMOXICILLIN 500 MG CAPSULE: 500 | 5 days supply | Qty: 20 | Fill #0

## 2019-12-05 MED FILL — CYCLOBENZAPRINE HCL 10 MG T: 10 | 10 days supply | Qty: 30 | Fill #0

## 2019-12-16 MED FILL — LOSARTAN POTASSIUM 100 MG T: 100 | 30 days supply | Qty: 30 | Fill #5

## 2019-12-29 MED FILL — FAMOTIDINE 40 MG TABS: 40 | 30 days supply | Qty: 30 | Fill #2

## 2020-01-04 MED FILL — PANTOPRAZOLE SOD DR 40 MG T: 40 | 45 days supply | Qty: 90 | Fill #1

## 2020-01-06 MED FILL — CYCLOBENZAPRINE HCL 10 MG T: 10 | 10 days supply | Qty: 30 | Fill #1

## 2020-01-08 ENCOUNTER — Other Ambulatory Visit (HOSPITAL_COMMUNITY): Payer: Self-pay | Admitting: Internal Medicine

## 2020-01-08 MED FILL — MONTELUKAST SOD 10 MG TAB: 10 | 90 days supply | Qty: 90 | Fill #0

## 2020-01-10 IMAGING — CR DG CHEST 2V
2 series · 2 of 2 positions shown · non-contrast
Comparison: 04/25/2016

CLINICAL DATA: Mild intermittent asthma without complication.

EXAM:
CHEST  2 VIEW

[w chest pa]
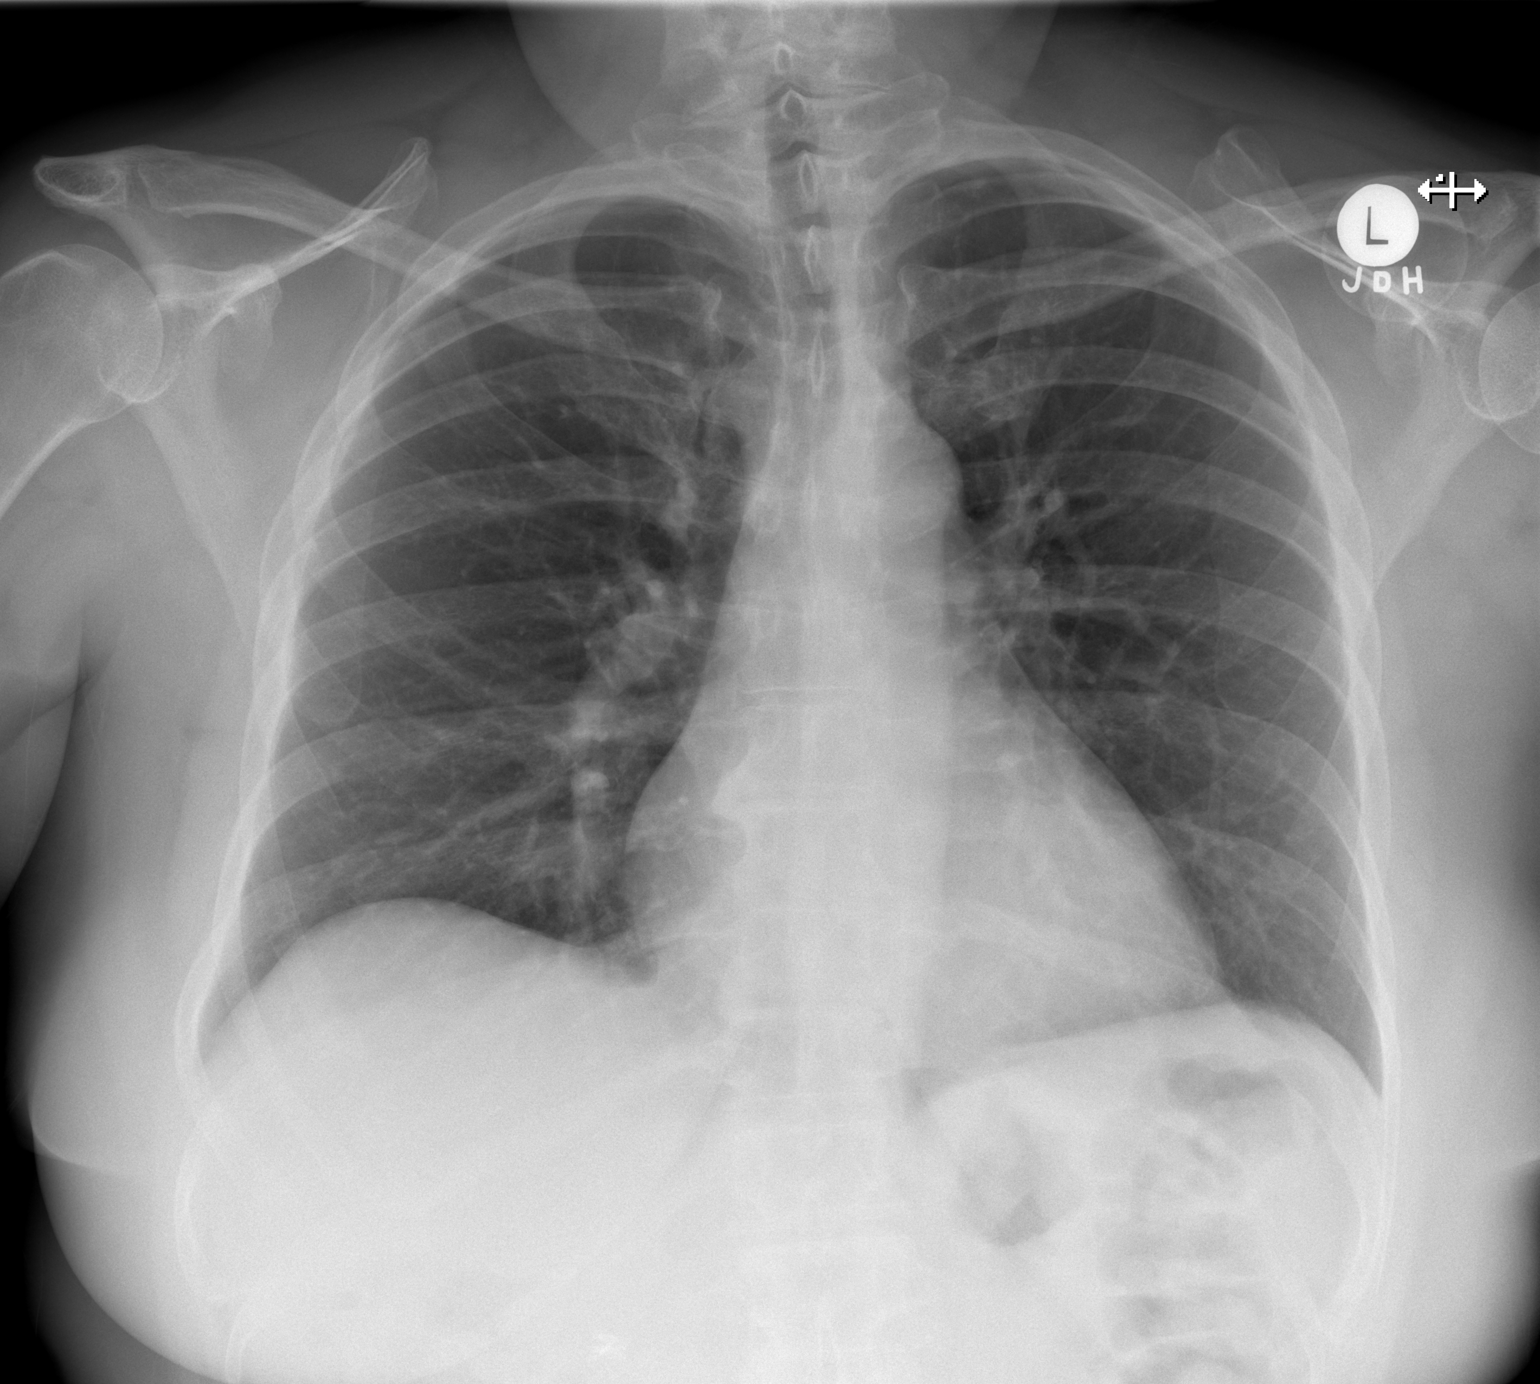

[w chest lat]
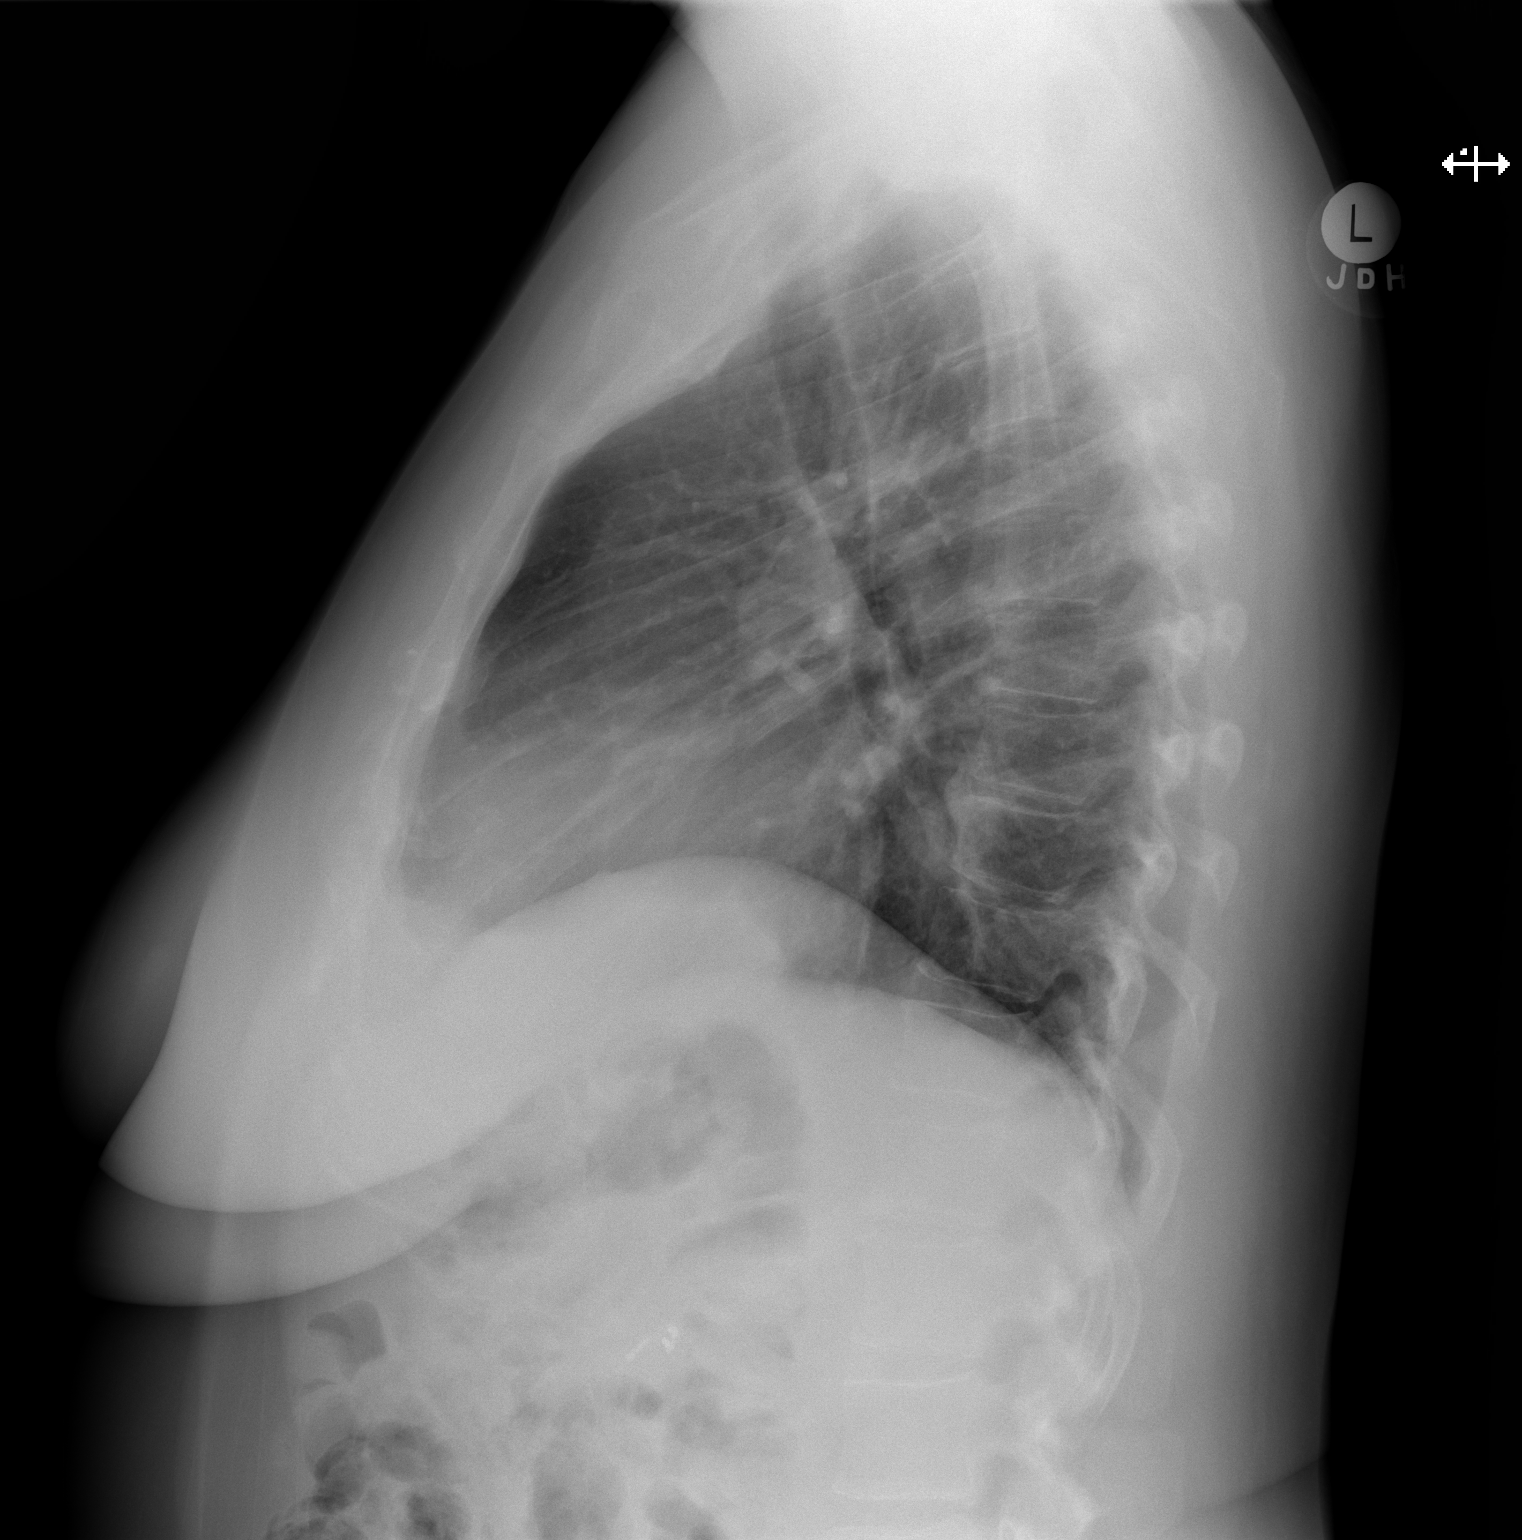

[2 of 2 positions shown; findings below may reference images not displayed]

FINDINGS: The heart size and mediastinal contours are within normal limits.
Both lungs are clear. The visualized skeletal structures are
unremarkable.
IMPRESSION: Normal exam.

## 2020-01-17 MED FILL — LOSARTAN POTASSIUM 100 MG T: 100 | 30 days supply | Qty: 30 | Fill #0

## 2020-01-30 MED FILL — AMLODIPINE BESYLATE 5 MG TA: 5 | 90 days supply | Qty: 90 | Fill #0

## 2020-02-01 MED FILL — FAMOTIDINE 40 MG TABS: 40 | 30 days supply | Qty: 30 | Fill #3

## 2020-02-01 MED FILL — GABAPENTIN 300 MG CAPSULE: 300 | 90 days supply | Qty: 90 | Fill #2

## 2020-02-06 MED FILL — CYCLOBENZAPRINE HCL 10 MG T: 10 | 10 days supply | Qty: 30 | Fill #0

## 2020-02-27 DIAGNOSIS — K5732 Diverticulitis of large intestine without perforation or abscess without bleeding: Secondary | ICD-10-CM | POA: Diagnosis not present

## 2020-02-27 DIAGNOSIS — G4733 Obstructive sleep apnea (adult) (pediatric): Secondary | ICD-10-CM | POA: Diagnosis not present

## 2020-02-27 DIAGNOSIS — I10 Essential (primary) hypertension: Secondary | ICD-10-CM | POA: Diagnosis not present

## 2020-02-27 DIAGNOSIS — G43909 Migraine, unspecified, not intractable, without status migrainosus: Secondary | ICD-10-CM | POA: Diagnosis not present

## 2020-02-27 DIAGNOSIS — R7309 Other abnormal glucose: Secondary | ICD-10-CM | POA: Diagnosis not present

## 2020-02-27 DIAGNOSIS — Z6833 Body mass index (BMI) 33.0-33.9, adult: Secondary | ICD-10-CM | POA: Diagnosis not present

## 2020-02-27 DIAGNOSIS — Z0001 Encounter for general adult medical examination with abnormal findings: Secondary | ICD-10-CM | POA: Diagnosis not present

## 2020-02-27 DIAGNOSIS — Z1389 Encounter for screening for other disorder: Secondary | ICD-10-CM | POA: Diagnosis not present

## 2020-02-28 ENCOUNTER — Other Ambulatory Visit (HOSPITAL_COMMUNITY): Payer: Self-pay | Admitting: Family Medicine

## 2020-02-28 MED FILL — PANTOPRAZOLE SOD DR 40 MG T: 40 | 45 days supply | Qty: 90 | Fill #0

## 2020-02-28 MED FILL — EPINEPHRINE 0.3 MG AUTO-INJ: 0.3 | 30 days supply | Qty: 2 | Fill #0

## 2020-03-01 DIAGNOSIS — E538 Deficiency of other specified B group vitamins: Secondary | ICD-10-CM | POA: Diagnosis not present

## 2020-03-15 DIAGNOSIS — M1711 Unilateral primary osteoarthritis, right knee: Secondary | ICD-10-CM | POA: Diagnosis not present

## 2020-03-15 DIAGNOSIS — M25561 Pain in right knee: Secondary | ICD-10-CM | POA: Diagnosis not present

## 2020-03-20 MED FILL — FAMOTIDINE 40 MG TABS: 40 | 30 days supply | Qty: 30 | Fill #4

## 2020-03-21 MED FILL — LOSARTAN POTASSIUM 100 MG T: 100 | 90 days supply | Qty: 90 | Fill #0

## 2020-04-04 DIAGNOSIS — Z6835 Body mass index (BMI) 35.0-35.9, adult: Secondary | ICD-10-CM | POA: Diagnosis not present

## 2020-04-04 DIAGNOSIS — D511 Vitamin B12 deficiency anemia due to selective vitamin B12 malabsorption with proteinuria: Secondary | ICD-10-CM | POA: Diagnosis not present

## 2020-04-04 DIAGNOSIS — E6609 Other obesity due to excess calories: Secondary | ICD-10-CM | POA: Diagnosis not present

## 2020-04-04 DIAGNOSIS — G43909 Migraine, unspecified, not intractable, without status migrainosus: Secondary | ICD-10-CM | POA: Diagnosis not present

## 2020-04-12 ENCOUNTER — Ambulatory Visit (INDEPENDENT_AMBULATORY_CARE_PROVIDER_SITE_OTHER): Payer: 59 | Admitting: Gastroenterology

## 2020-04-12 ENCOUNTER — Ambulatory Visit (HOSPITAL_COMMUNITY)
Admission: RE | Admit: 2020-04-12 | Discharge: 2020-04-12 | Disposition: A | Discharge status: Home / Self Care | Payer: 59 | Source: Ambulatory Visit | Attending: Gastroenterology | Admitting: Gastroenterology

## 2020-04-12 ENCOUNTER — Other Ambulatory Visit (HOSPITAL_COMMUNITY)
Admission: RE | Admit: 2020-04-12 | Discharge: 2020-04-12 | Disposition: A | Discharge status: Home / Self Care | Payer: 59 | Source: Ambulatory Visit | Attending: Gastroenterology | Admitting: Gastroenterology

## 2020-04-12 ENCOUNTER — Telehealth (INDEPENDENT_AMBULATORY_CARE_PROVIDER_SITE_OTHER): Payer: Self-pay | Admitting: Internal Medicine

## 2020-04-12 ENCOUNTER — Telehealth (INDEPENDENT_AMBULATORY_CARE_PROVIDER_SITE_OTHER): Payer: Self-pay | Admitting: Gastroenterology

## 2020-04-12 ENCOUNTER — Encounter (INDEPENDENT_AMBULATORY_CARE_PROVIDER_SITE_OTHER): Payer: Self-pay | Admitting: Gastroenterology

## 2020-04-12 ENCOUNTER — Other Ambulatory Visit: Payer: Self-pay | Admitting: Nurse Practitioner

## 2020-04-12 ENCOUNTER — Other Ambulatory Visit: Payer: Self-pay

## 2020-04-12 DIAGNOSIS — K5732 Diverticulitis of large intestine without perforation or abscess without bleeding: Secondary | ICD-10-CM | POA: Diagnosis not present

## 2020-04-12 DIAGNOSIS — R1032 Left lower quadrant pain: Secondary | ICD-10-CM

## 2020-04-12 LAB — COMPREHENSIVE METABOLIC PANEL
ALT: 15 U/L (ref 0–44)
AST: 15 U/L (ref 15–41)
Albumin: 4.1 g/dL (ref 3.5–5.0)
Alkaline Phosphatase: 53 U/L (ref 38–126)
Anion gap: 10 (ref 5–15)
BUN: 12 mg/dL (ref 6–20)
CO2: 27 mmol/L (ref 22–32)
Calcium: 9.2 mg/dL (ref 8.9–10.3)
Chloride: 102 mmol/L (ref 98–111)
Creatinine, Ser: 0.84 mg/dL (ref 0.44–1.00)
GFR calc Af Amer: >60 mL/min (ref >60)
GFR calc non Af Amer: >60 mL/min (ref >60)
Glucose, Bld: 109 mg/dL — ABNORMAL HIGH (ref 70–99)
Potassium: 4.2 mmol/L (ref 3.5–5.1)
Sodium: 139 mmol/L (ref 135–145)
Total Bilirubin: 1 mg/dL (ref 0.3–1.2)
Total Protein: 7.8 g/dL (ref 6.5–8.1)

## 2020-04-12 LAB — CBC
HCT: 38.5 % (ref 36.0–46.0)
Hemoglobin: 12.5 g/dL (ref 12.0–15.0)
MCH: 28.7 pg (ref 26.0–34.0)
MCHC: 32.5 g/dL (ref 30.0–36.0)
MCV: 88.5 fL (ref 80.0–100.0)
Platelets: 368 10*3/uL (ref 150–400)
RBC: 4.35 MIL/uL (ref 3.87–5.11)
RDW: 15.1 % (ref 11.5–15.5)
WBC: 6.2 10*3/uL (ref 4.0–10.5)
nRBC: 0 % (ref 0.0–0.2)

## 2020-04-12 MED ORDER — METRONIDAZOLE 500 MG PO TABS: 500.0000 mg | ORAL_TABLET | Freq: Two times a day (BID) | ORAL | 0 refills | Status: DC

## 2020-04-12 MED ORDER — CIPROFLOXACIN HCL 500 MG PO TABS: 500.0000 mg | ORAL_TABLET | Freq: Two times a day (BID) | ORAL | 0 refills | Status: AC

## 2020-04-12 MED ORDER — METRONIDAZOLE 500 MG PO TABS
500.0000 mg | ORAL_TABLET | Freq: Three times a day (TID) | ORAL | 0 refills | Status: AC
Start: 2020-04-12 — End: 2020-04-19

## 2020-04-12 MED FILL — METRONIDAZOLE 500 MG TABS: 500 | 7 days supply | Qty: 21 | Fill #0

## 2020-04-12 MED FILL — CIPROFLOXACIN HCL 500 MG TA: 500 | 7 days supply | Qty: 14 | Fill #0

## 2020-04-12 NOTE — Progress Notes (Signed)
[name], M.D. Gastroenterology & Hepatology [facility] [address] [place], [place] [address]  [address] [place] [address]  Referring MD: Self  I will communicate my assessment and recommendations to the referring MD via EMR. "Note: Occasional unusual wording and randomly placed punctuation marks may result from the use of speech recognition technology to transcribe this document"  Chief Complaint: Left lower quadrant abdominal pain  History of Present Illness: [name] is a 54 y.o. female with past medical history of asthma, ?small bowel Crohn's disease, GERD, hypertension, obesity, diverticulosis and multiple episodes diverticulitis, who presents for evaluation of new onset left lower quadrant abdominal pain.  The patient is a well-known patient to our clinic.  She comes today after presenting worsening left lower quadrant abdominal pain since yesterday, which radiates to her back along with bloating episodes.  She states that it feels similar to her previous episodes of diverticulitis for which she was required at least one hospitalization in the last 2 years.  The patient reported her bowel movements are normal in consistency and denies having any blood or melena.  Denies having any fever or chills so far.  She has felt slightly nauseated but denies having any vomiting.  Regarding her history of diverticulitis, the patient had her last episode in October 2020, which was evidence in CT scan ordered stat at that time.  She improved with antibiotics.  On the other hand, the patient has a history of possible Crohn's disease which has mainly affected her small bowel and has been diagnosed since 2012.  She has been on and off Entocort treatment, with most recent course for 6 weeks which she finished in May 2021.  Her last colonoscopy was performed on 04/26/2019 which showed presence of scattered erosions in the  terminal ileum and 2 erosions in the cecum which were biopsied.  Pathology was positive for acute inflammation in the colon and erosive changes in the ileum.  No chronic changes were seen with differential including NSAID injury versus self-limited colitis.  Dr. Laural Golden reviewed the slides with the pathologist who considered that the findings are very mild and was not completely consistent with IBD.  The patient was ordered a capsule endoscopy which she says she performed but I do not have access to these records at the moment.  The patient denies having any nausea, vomiting, fever, chills, hematochezia, melena, hematemesis, abdominal distention, abdominal pain, diarrhea, jaundice, pruritus or weight changes.  Past Medical History: Past Medical History:  Diagnosis Date  . Anxiety   . Arthritis   . Asthma   . Cervical hyperplasia    hisotry of  . Chest pain    cath 2005 norm cors, repeat cath Feb 2011 normal cors and only minimally  elevated pulmonary pressures  . Crohn disease ([place])   . Diverticulosis   . GERD (gastroesophageal reflux disease)   . Hiatal hernia   . History of iron deficiency anemia   . Hypertension   . Migraines   . Obesity   . Palpitations   . Pneumonia 2008   14 days hospitalization, bilateral  . PONV (postoperative nausea and vomiting)   . Sleep apnea sleep study 11/03/2009    cpap; does sometimes, doesnt use every night. weight loss no longer using CPAP    Past Surgical History: Past Surgical History:  Procedure Laterality Date  . ABDOMINAL HYSTERECTOMY    . ANKLE ARTHROSCOPY  06/01/2012   Procedure: ANKLE ARTHROSCOPY;  Surgeon:  Colin Rhein, MD;  Location: [facility];  Service: Orthopedics;  Laterality: Left;  left ankle arthorsocpy with extensive debridement and gastroc slide  . ANKLE SURGERY Left 05/11/2018  . BIOPSY  04/26/2019   Procedure: BIOPSY;  Surgeon: Rogene Houston, MD;  [facility];  Service: Endoscopy;;  ileum colon   . CARDIAC CATHETERIZATION  11/22/2009 and 2005   WNL  . CESAREAN SECTION    . CHOLECYSTECTOMY  09/08/2006   lap. chole.  . CHONDROPLASTY  06/17/2012   Procedure: CHONDROPLASTY;  Surgeon: Carole Civil, MD;  Location: [facility];  Service: Orthopedics;  Laterality: Right;  right patella  . COLONOSCOPY N/A 04/26/2019   Procedure: COLONOSCOPY;  Surgeon: Rogene Houston, MD;  [facility];  Service: Endoscopy;  Laterality: N/A;  100  . ESOPHAGOGASTRODUODENOSCOPY N/A 05/14/2015   Procedure: ESOPHAGOGASTRODUODENOSCOPY (EGD);  Surgeon: Rogene Houston, MD;  [facility];  Service: Endoscopy;  Laterality: N/A;  730  . GIVENS CAPSULE STUDY N/A 05/24/2019   Procedure: GIVENS CAPSULE STUDY;  Surgeon: Rogene Houston, MD;  [facility];  Service: Endoscopy;  Laterality: N/A;  7:30  . INGUINAL HERNIA REPAIR  10/30/2008   right  . KNEE ARTHROSCOPY Right 2013  . KNEE ARTHROSCOPY WITH LATERAL MENISECTOMY Left 12/23/2016   Procedure: LEFT KNEE ARTHROSCOPY WITH LATERAL MENISECTOMY;  Surgeon: Carole Civil, MD;  [facility];  Service: Orthopedics;  Laterality: Left;  . KNEE ARTHROSCOPY WITH MEDIAL MENISECTOMY Left 12/23/2016   Procedure: LEFT KNEE ARTHROSCOPY WITH MEDIAL MENISECTOMY CHONDROPLASTY PATELLA  AND MEDIAL FEMORAL CONDYLE LEFT KNEE;  Surgeon: Carole Civil, MD;  Location: [facility];  Service: Orthopedics;  Laterality: Left;  . SHOULDER ARTHROSCOPY WITH SUBACROMIAL DECOMPRESSION AND BICEP TENDON REPAIR Left 12/21/2017   Procedure: Left shoulder arthroscopic biceps tenodesis, SAD, DCR and labrum debridement;  Surgeon: Nicholes Stairs, MD;  Location: [facility];  Service: Orthopedics;  Laterality: Left;  120 mins  . SHOULDER SURGERY     right x 2   . TOTAL KNEE ARTHROPLASTY Left 06/06/2019   Procedure: TOTAL KNEE ARTHROPLASTY;  Surgeon: Paralee Cancel, MD;  Location: [facility];  Service: Orthopedics;  Laterality: Left;  70 mins    Family History: Family History   Problem Relation Age of Onset  . Hypertension Mother   . Lupus Father   . COPD Father   . Congestive Heart Failure Maternal Grandmother     Social History: Social History   Tobacco Use  Smoking Status Never Smoker  Smokeless Tobacco Never Used   Social History   Substance and Sexual Activity  Alcohol Use No  . Alcohol/week: 0.0 standard drinks   Social History   Substance and Sexual Activity  Drug Use Never    Allergies: Allergies  Allergen Reactions  . Iodinated Diagnostic Agents Anaphylaxis and Hives  . Other Shortness Of Breath    Lemon grass  . Wasp Venom Anaphylaxis and Shortness Of Breath  . Pollen Extract Swelling  . Adhesive [Tape] Rash  . Hydromorphone Hcl Nausea And Vomiting  . Omnipaque [Iohexol] Hives and Nausea Only    Pt. Was premedicated with emergent protocol.    Medications: Current Outpatient Medications  Medication Sig Dispense Refill  . acetaminophen (TYLENOL) 500 MG tablet Take 2 tablets (1,000 mg total) by mouth every 8 (eight) hours. 30 tablet 0  . albuterol (PROVENTIL) (2.5 MG/3ML) 0.083% nebulizer solution Take 2.5 mg by nebulization every 6 (six) hours as needed. For  shortness of breath    . albuterol (VENTOLIN HFA) 108 (90 Base) MCG/ACT inhaler Inhale 1-2 puffs into the lungs every 6 (six) hours as needed for wheezing or shortness of breath.    Marland Kitchen amLODipine (NORVASC) 5 MG tablet Take 5 mg by mouth at bedtime.    . B Complex Vitamins (VITAMIN B COMPLEX 100 IJ) Inject 1,000 mg as directed every 30 (thirty) days.    . cyclobenzaprine (FLEXERIL) 10 MG tablet Take 1 tablet (10 mg total) by mouth 3 (three) times daily as needed for muscle spasms. 40 tablet 0  . diphenhydrAMINE (BENADRYL) 25 MG tablet Take 25 mg by mouth every 6 (six) hours as needed for allergies.    Marland Kitchen EPINEPHrine (EPIPEN) 0.3 mg/0.3 mL SOAJ injection Inject 0.3 mLs (0.3 mg total) into the muscle once. 1 Device 0  . famotidine (PEPCID) 40 MG tablet Take 1 tablet (40 mg total)  by mouth at bedtime. 90 tablet 1  . gabapentin (NEURONTIN) 300 MG capsule Take 1 capsule (300 mg total) by mouth at bedtime. 90 capsule 5  . ibuprofen (ADVIL) 800 MG tablet TAKE 1 TABLET BY MOUTH EVERY 8 HOURS AS NEEDED 90 tablet 1  . losartan (COZAAR) 100 MG tablet Take 100 mg by mouth at bedtime.     . Magnesium 500 MG CAPS Take 2,000 mg by mouth at bedtime.     . meclizine (ANTIVERT) 25 MG tablet Take 25 mg by mouth 3 (three) times daily as needed for dizziness.    . montelukast (SINGULAIR) 10 MG tablet Take 10 mg by mouth at bedtime.    . Multiple Vitamin (MULTIVITAMIN) tablet Take 1 tablet by mouth daily.    . ondansetron (ZOFRAN) 4 MG tablet Take 1 tablet (4 mg total) by mouth every 8 (eight) hours as needed for nausea or vomiting. 20 tablet 0  . pantoprazole (PROTONIX) 40 MG tablet TAKE (1) TABLET BY MOUTH TWICE DAILY BEFORE A MEAL. (Patient taking differently: Take 40 mg by mouth at bedtime. ) 60 tablet 1  . polyethylene glycol (MIRALAX / GLYCOLAX) 17 g packet Take 17 g by mouth 2 (two) times daily. 28 packet 0  . Vitamin D-Vitamin K (K2 PLUS D3 PO) Take 2 capsules by mouth daily.    . budesonide (ENTOCORT EC) 3 MG 24 hr capsule Take 1 capsule (3 mg total) by mouth daily. 9 mg daily for 2 weeks followed by 6 mg daily for 2 weeks and then 3 mg daily for 2 weeks (Patient not taking: Reported on 04/12/2020) 90 capsule 0  . docusate sodium (COLACE) 100 MG capsule Take 1 capsule (100 mg total) by mouth 2 (two) times daily. (Patient not taking: Reported on 04/12/2020) 28 capsule 0   No current facility-administered medications for this visit.    Review of Systems: GENERAL: negative for malaise, significant weight loss, night sweats and fever HEENT: No changes in hearing or vision, no nose bleeds or other nasal problems. No trouble swallowing NECK: Negative for lumps, goiter, pain and significant neck swelling RESPIRATORY: Negative for cough, wheezing and shortness of breath CARDIOVASCULAR:  Negative for chest pain, leg swelling, palpitations, orthopnea GI: SEE HPI MUSCULOSKELETAL: Negative for joint pain or swelling, back pain, and muscle pain. SKIN: Negative for lesions, rash, and itching. PSYCH: Negative for sleep disturbance, mood disorder and recent psychosocial stressors. HEMATOLOGY Negative for prolonged bleeding, bruising easily, and swollen nodes. ENDOCRINE: Negative for cold or heat intolerance, polyuria, polydipsia and goiter. NEURO: negative for lightheadedness, dizziness, tremor, gait imbalance,  syncope and seizures. The remainder of the review of systems is noncontributory.   Physical Exam: BP (!) 138/98 (BP Location: Right Arm, Patient Position: Sitting, Cuff Size: Large) Comment: patient in pain  Pulse 85   Temp 97.9 F (36.6 C) (Oral)   Ht 5' 11"  (1.803 m)   Wt 247 lb 1.6 oz (112.1 kg)   BMI 34.46 kg/m  GENERAL: The patient is AO x3, in no acute distress. Obese. Looks in discomfort. HEENT: Head is normocephalic and atraumatic. EOMI are intact. Mouth is well hydrated and without lesions. NECK: Supple. No masses LUNGS: Clear to auscultation. No presence of rhonchi/wheezing/rales. Adequate chest expansion HEART: RRR, normal s1 and s2. ABDOMEN: tender to palpation in the L side of the abdomen worse in the flank and LLQ, no guarding, no peritoneal signs, and nondistended. BS +. No masses. EXTREMITIES: Without any cyanosis, clubbing, rash, lesions or edema. NEUROLOGIC: AOx3, no focal motor deficit. SKIN: no jaundice, no rashes  Imaging/Labs: as above  I personally reviewed and interpreted the available labs, imaging and endoscopic files.  Impression and Plan: QUILLA FREEZE is a 54 y.o. female with past medical history of asthma, ?small bowel Crohn's disease, GERD, hypertension, obesity, diverticulosis and multiple episodes diverticulitis, who presents for evaluation of new onset left lower quadrant abdominal pain. The patient has presented new onset left  lower quadrant abdominal pain which per her description is similar to her previous episodes of diverticulitis.  She has had evidence of these in previous imaging.  At this moment, it would be important to repeat a CT scan of the abdomen to determine the presence of these alterations or if there is any other reason for her pain.  Importantly, we were notified by the radiology department that they will not be able to perform the study with IV contrast today as the patient requires desensitization protocol due to contrast allergy.  Due to this, will order CT abdomen without IV contrast and with oral contrast stat.  The patient will also be ordered a CBC and a CMP stat.  I will send to her pharmacy a prescription for ciprofloxacin and metronidazole which she will take if her imaging is consistent with diverticulitis.  I will communicate with her the results of this study.  Finally, she will need to follow-up with our clinic regarding her possible Crohn's disease as this needs further surveillance.  -Perform CT scan STAT -Perform blood workup STAT -Start ciprofloxacin/metronidazole for 7 days once CT scan result has been discussed by phone - RTC in 6 weeks for follow up  All questions were answered.      Harvel Quale, MD

## 2020-04-12 NOTE — Telephone Encounter (Signed)
I have given this to [place]. He would like to talk with the patient before approving prescriptions to be called in. He will let me   know as to what needs to be done.

## 2020-04-12 NOTE — Telephone Encounter (Signed)
Patient left voice mail message wanting Flagyl and Cipro called in to [facility] - states she is having a diverticulitis flare - please advise - [phone]

## 2020-04-12 NOTE — Telephone Encounter (Signed)
Patient has reached out to our receptionist and she is coming in for OV with [facility].

## 2020-04-12 NOTE — Patient Instructions (Addendum)
Perform CT scan STAT Perform blood workup STAT Please start taking antibiotics once CT scan result has been discussed by phone

## 2020-04-12 NOTE — Addendum Note (Signed)
Addended by: Harvel Quale on: 04/12/2020 05:08 PM   Modules accepted: Orders

## 2020-04-12 NOTE — Telephone Encounter (Signed)
Spoke with patient regarding findings consistent with diverticulitis (uncomplicated) told her to take the prescribed antibiotic. She asked about ways to avoid new episodes, I encouraged her to avoid at all cost NSAID intake (even topical), but ultimately if it keeps recurring she may consider surgery.  Harvel Quale, MD Gastroenterology and Hepatology

## 2020-04-12 NOTE — Telephone Encounter (Signed)
Tammy pls have Thayer Headings or Dr. Laural Golden review RX request and follow up for this pt. Thx

## 2020-04-12 NOTE — Telephone Encounter (Signed)
Spoke to the patient who reported having worsening left lower quadrant abdominal pain for last few days and discomfort.  She believes this is related to a diverticulitis episode as she has presented this before, most recent one in October 2020 that required antibiotic treatment.  I explained to the patient that I will need to see her in the office before prescribing the antibiotic and it is likely that she will need to have stat CT scan performed.  She will try to come to the office today in the morning if possible, I explained to her that if this is not possible it would be better for her to go to the ER be tested.  The patient understood and agreed.  She will reach [place] back to confirm if she can come to an appointment.  [name] Quale, MD Gastroenterology and Hepatology

## 2020-04-15 NOTE — Telephone Encounter (Signed)
Called pharmacy and clarified script. Thanks, Harvel Quale, MD Gastroenterology and Hepatology

## 2020-04-15 NOTE — Telephone Encounter (Signed)
Tammy I am forwarding this back to you and Dr. Laural Golden.

## 2020-04-15 NOTE — Telephone Encounter (Signed)
This has been addressed by Dr. [name].

## 2020-04-16 ENCOUNTER — Other Ambulatory Visit (HOSPITAL_COMMUNITY): Payer: Self-pay | Admitting: Orthopedic Surgery

## 2020-04-16 MED FILL — CYCLOBENZAPRINE HCL 10 MG T: 10 | 10 days supply | Qty: 30 | Fill #0

## 2020-04-16 MED FILL — MONTELUKAST SOD 10 MG TAB: 10 | 90 days supply | Qty: 90 | Fill #1

## 2020-04-25 MED FILL — FAMOTIDINE 40 MG TABS: 40 | 30 days supply | Qty: 30 | Fill #5

## 2020-04-30 DIAGNOSIS — D511 Vitamin B12 deficiency anemia due to selective vitamin B12 malabsorption with proteinuria: Secondary | ICD-10-CM | POA: Diagnosis not present

## 2020-05-16 MED FILL — AMLODIPINE BESYLATE 5 MG TA: 5 | 90 days supply | Qty: 90 | Fill #0

## 2020-05-29 DIAGNOSIS — D511 Vitamin B12 deficiency anemia due to selective vitamin B12 malabsorption with proteinuria: Secondary | ICD-10-CM | POA: Diagnosis not present

## 2020-06-04 ENCOUNTER — Other Ambulatory Visit (INDEPENDENT_AMBULATORY_CARE_PROVIDER_SITE_OTHER): Payer: Self-pay | Admitting: Internal Medicine

## 2020-06-04 MED FILL — GABAPENTIN 300 MG CAPSULE: 300 | 90 days supply | Qty: 90 | Fill #3

## 2020-06-04 MED FILL — FAMOTIDINE 40 MG TABS: 40 | 30 days supply | Qty: 30 | Fill #0

## 2020-06-04 MED FILL — CYCLOBENZAPRINE HCL 10 MG T: 10 | 10 days supply | Qty: 30 | Fill #1

## 2020-06-12 DIAGNOSIS — M1711 Unilateral primary osteoarthritis, right knee: Secondary | ICD-10-CM | POA: Diagnosis not present

## 2020-06-12 DIAGNOSIS — M25561 Pain in right knee: Secondary | ICD-10-CM | POA: Diagnosis not present

## 2020-06-12 DIAGNOSIS — Z96652 Presence of left artificial knee joint: Secondary | ICD-10-CM | POA: Diagnosis not present

## 2020-06-12 DIAGNOSIS — Z471 Aftercare following joint replacement surgery: Secondary | ICD-10-CM | POA: Diagnosis not present

## 2020-06-20 ENCOUNTER — Other Ambulatory Visit: Payer: Self-pay

## 2020-06-21 ENCOUNTER — Ambulatory Visit: Payer: 59 | Admitting: Neurology

## 2020-06-21 ENCOUNTER — Encounter: Payer: Self-pay | Admitting: Neurology

## 2020-06-21 ENCOUNTER — Other Ambulatory Visit: Payer: Self-pay | Admitting: Neurology

## 2020-06-21 VITALS — BP 142/100 | HR 90 | Ht 71.5 in | Wt 252.0 lb

## 2020-06-21 DIAGNOSIS — M792 Neuralgia and neuritis, unspecified: Secondary | ICD-10-CM

## 2020-06-21 DIAGNOSIS — R252 Cramp and spasm: Secondary | ICD-10-CM | POA: Diagnosis not present

## 2020-06-21 DIAGNOSIS — R519 Headache, unspecified: Secondary | ICD-10-CM

## 2020-06-21 DIAGNOSIS — R51 Headache with orthostatic component, not elsewhere classified: Secondary | ICD-10-CM

## 2020-06-21 DIAGNOSIS — G43709 Chronic migraine without aura, not intractable, without status migrainosus: Secondary | ICD-10-CM

## 2020-06-21 DIAGNOSIS — H539 Unspecified visual disturbance: Secondary | ICD-10-CM

## 2020-06-21 DIAGNOSIS — R5383 Other fatigue: Secondary | ICD-10-CM

## 2020-06-21 DIAGNOSIS — Z8669 Personal history of other diseases of the nervous system and sense organs: Secondary | ICD-10-CM | POA: Diagnosis not present

## 2020-06-21 DIAGNOSIS — M791 Myalgia, unspecified site: Secondary | ICD-10-CM

## 2020-06-21 MED ORDER — AMITRIPTYLINE HCL 25 MG PO TABS: 25.0000 mg | ORAL_TABLET | Freq: Every day | ORAL | 3 refills | Status: DC

## 2020-06-21 MED ORDER — AJOVY 225 MG/1.5ML ~~LOC~~ SOAJ
225.0000 mg | SUBCUTANEOUS | 3 refills | Status: DC
Start: 2020-06-21 — End: 2020-12-10

## 2020-06-21 MED ORDER — RIZATRIPTAN BENZOATE 10 MG PO TBDP: 10.0000 mg | ORAL_TABLET | ORAL | 11 refills | Status: DC | PRN

## 2020-06-21 MED ORDER — AJOVY 225 MG/1.5ML ~~LOC~~ SOAJ: 225.0000 mg | SUBCUTANEOUS | 0 refills | Status: DC

## 2020-06-21 MED FILL — AMITRIPTYLINE HCL 25 MG TAB: 25 | 30 days supply | Qty: 30 | Fill #0

## 2020-06-21 MED FILL — RIZATRIPTAN 10 MG ODT: 10 | 30 days supply | Qty: 9 | Fill #0

## 2020-06-21 NOTE — Progress Notes (Addendum)
[facility]    Provider:  Dr Jaynee Eagles Requesting Provider: Sharilyn Sites, MD Primary Care Provider:  Sharilyn Sites, MD  CC:  Multiple neurologic symptoms  HPI:  [name] is a 54 y.o. female here as requested by Sharilyn Sites, MD for headaches.  Past medical history obesity, headache, vitamin D deficiency.  I reviewed Dr. Delanna Ahmadi notes which provided no significant history of patient's chief complaint.  She is here today alone, she has been having headaches, started sometime in May, she had a physical at the end of the May and discussed headaches have been going away, also tired no energy, found out she was B12 deficiency and started shots per month.  Headaches are "minute", can also light sensitivity and nausea, she has some GI issues, Crohn's, has been on steroids.  She also reports nerve pain and stiffness in her hands and feet, she stopped using CPAP due to losing weight and long longer having any problems. She has been having headaches she has pressure in the back of her head at the occipital area and down to her neck, she has tried massage. They start in over the eyes as well. When they are really intense, she is sensitive to light, she has pressure in the back, nausea, pulsating/pulsing/throbbing. She is having the migraines at least 4x a week, they never really go away they just minimize, she will wake up with headaches, she had lost about 40 pounds and she has OSA in the past. She has gained 25 pounds since March, she doesn't sleep, ongoing for about a year after she found her brother dead in 09/10/23. She is fatigued, she has sleep issues. She staets she doesn't sleep all night and go without any sleep for 3 day - she says literally no sleep she turns a lot.  She has vision changes, headache are positional worse supinein the morning, morning headaches. She has muscle cramps, lots of stress. No other focal neurologic deficits, associated symptoms, inciting events or  modifiable factors.  Reviewed notes, labs and imaging from outside physicians, which showed:  MRI brain 2017: personally reviewed images and agree with the following:  Brain: No diffusion signal abnormality. Stable partially empty sella turcica. No significant T2 FLAIR signal abnormality. No abnormal enhancement. No abnormal susceptibility hypointensity to indicate intracranial hemorrhage. No focal mass effect. No extra-axial collection. Normal ventricle size.  MRA H/N 09/2016 reviewed reports/findings:  1. No acute intracranial abnormality. Normal MRI of the brain for age. 2. Patent circle of Willis. No large vessel occlusion, aneurysm, or significant stenosis is identified. 3. Patent carotid and vertebral arteries of the neck. No dissection, aneurysm, or significant stenosis is identified.  MRI cervical spine 01/08/2012:  personally reviewed images and agree with the following: 1. Tiny left paracentral disc protrusion at C5-6 without significant  canal or neural foraminal stenosis. Finding is stable relative to  previous MRI from 05/28/2007.  2. Mild degenerative disc disease at C4-5 without canal or foraminal  stenosis.  3. Otherwise unremarkable MRI of the cervical spine.   CBC/CMP: normal 04/12/2020  Amlodipine, tylenol, aspirin, flexeril, gabapentin, ibuprofen, toradol injection, magnesium, losartan, meclizine, mobic, reglan, zofran, tramadol, topiramate  Review of Systems: Patient complains of symptoms per HPI as well as the following symptoms: headache, fatigue, loss of energy Pertinent negatives and positives per HPI. All others negative.   Social History   Socioeconomic History  . Marital status: Married    Spouse name: Not on file  . Number of children: 4  .  Years of education: Not on file  . Highest education level: Not on file  Occupational History  . Occupation: [employer]  Tobacco Use  . Smoking status: Never Smoker  . Smokeless tobacco: Never Used  Vaping Use  .  Vaping Use: Never used  Substance and Sexual Activity  . Alcohol use: No    Alcohol/week: 0.0 standard drinks  . Drug use: Never  . Sexual activity: Yes    Birth control/protection: Surgical  Other Topics Concern  . Not on file  Social History Narrative   Lives with husband and oldest son    Caffeine use: about 5 cups of coffee/day   She is a respiratory therapist at [employer].     She has 4 children age 80-22.     Social Determinants of Health   Financial Resource Strain:   . Difficulty of Paying Living Expenses: Not on file  Food Insecurity:   . Worried About Charity fundraiser in the Last Year: Not on file  . Ran Out of Food in the Last Year: Not on file  Transportation Needs:   . Lack of Transportation (Medical): Not on file  . Lack of Transportation (Non-Medical): Not on file  Physical Activity:   . Days of Exercise per Week: Not on file  . Minutes of Exercise per Session: Not on file  Stress:   . Feeling of Stress : Not on file  Social Connections:   . Frequency of Communication with Friends and Family: Not on file  . Frequency of Social Gatherings with Friends and Family: Not on file  . Attends Religious Services: Not on file  . Active Member of Clubs or Organizations: Not on file  . Attends Archivist Meetings: Not on file  . Marital Status: Not on file  Intimate Partner Violence:   . Fear of Current or Ex-Partner: Not on file  . Emotionally Abused: Not on file  . Physically Abused: Not on file  . Sexually Abused: Not on file    Family History  Problem Relation Age of Onset  . Hypertension Mother   . Atrial fibrillation Mother   . Lupus Father   . COPD Father   . Lupus Sister   . Congestive Heart Failure Maternal Grandmother   . Migraines Neg Hx   . Headache Neg Hx     Past Medical History:  Diagnosis Date  . Anxiety   . Arthritis   . Asthma   . Cervical hyperplasia    hisotry of  . Chest pain    cath 2005 norm cors, repeat  cath Feb 2011 normal cors and only minimally  elevated pulmonary pressures  . Crohn disease ([place])   . Diverticulitis of colon 07/17/2014  . Diverticulosis   . GERD (gastroesophageal reflux disease)   . Hiatal hernia   . History of iron deficiency anemia   . Hypertension   . Migraines   . Obesity   . Palpitations   . Pneumonia 2008   14 days hospitalization, bilateral  . PONV (postoperative nausea and vomiting)   . Sleep apnea sleep study 11/03/2009    cpap; does sometimes, doesnt use every night. weight loss no longer using CPAP  . Vitamin B deficiency     Patient Active Problem List   Diagnosis Date Noted  . Chronic migraine without aura without status migrainosus, not intractable 06/22/2020  . Iron deficiency anemia 06/20/2019  . S/P left TKA 06/06/2019  . Status post total left knee  replacement 06/06/2019  . Special screening for malignant neoplasms, colon 03/28/2019  . LLQ abdominal pain 09/26/2018  . Derangement of anterior horn of lateral meniscus of left knee   . Derangement of posterior horn of medial meniscus of left knee   . Chest pain 04/25/2016  . Hypokalemia 04/25/2016  . Hyperglycemia 04/25/2016  . Diverticulitis of colon 07/17/2014  . Nausea without vomiting 07/17/2014  . Crohn disease ([place]) 07/17/2014  . History of arthroscopy of right knee 07/25/2012  . Difficulty in walking(719.7) 06/29/2012  . Weakness of right leg 06/29/2012  . Posterior tibial tendonitis 11/11/2011  . PTTD (posterior tibial tendon dysfunction) 11/11/2011  . Knee pain, right 11/11/2011  . Chest pain 07/16/2011  . Primary osteoarthritis of left knee 02/25/2010  . PAIN IN JOINT, MULTIPLE SITES 02/25/2010  . Essential hypertension 08/13/2009  . HIATAL HERNIA 08/13/2009  . PALPITATIONS 08/13/2009  . DYSPNEA 08/13/2009    Past Surgical History:  Procedure Laterality Date  . ABDOMINAL HYSTERECTOMY    . ANKLE ARTHROSCOPY  06/01/2012   Procedure: ANKLE ARTHROSCOPY;  Surgeon: Colin Rhein, MD;  Location: [place];  Service: Orthopedics;  Laterality: Left;  left ankle arthorsocpy with extensive debridement and gastroc slide  . ANKLE SURGERY Left 05/11/2018  . BIOPSY  04/26/2019   Procedure: BIOPSY;  Surgeon: Rogene Houston, MD;  [facility];  Service: Endoscopy;;  ileum colon  . CARDIAC CATHETERIZATION  11/22/2009 and 2005   WNL  . CESAREAN SECTION    . CHOLECYSTECTOMY  09/08/2006   lap. chole.  . CHONDROPLASTY  06/17/2012   Procedure: CHONDROPLASTY;  Surgeon: Carole Civil, MD;  Location: [facility];  Service: Orthopedics;  Laterality: Right;  right patella  . COLONOSCOPY N/A 04/26/2019   Procedure: COLONOSCOPY;  Surgeon: Rogene Houston, MD;  [facility];  Service: Endoscopy;  Laterality: N/A;  100  . ESOPHAGOGASTRODUODENOSCOPY N/A 05/14/2015   Procedure: ESOPHAGOGASTRODUODENOSCOPY (EGD);  Surgeon: Rogene Houston, MD;  [facility];  Service: Endoscopy;  Laterality: N/A;  730  . GIVENS CAPSULE STUDY N/A 05/24/2019   Procedure: GIVENS CAPSULE STUDY;  Surgeon: Rogene Houston, MD;  [facility];  Service: Endoscopy;  Laterality: N/A;  7:30  . INGUINAL HERNIA REPAIR  10/30/2008   right  . KNEE ARTHROSCOPY Right 2013  . KNEE ARTHROSCOPY WITH LATERAL MENISECTOMY Left 12/23/2016   Procedure: LEFT KNEE ARTHROSCOPY WITH LATERAL MENISECTOMY;  Surgeon: Carole Civil, MD;  Location: [facility];  Service: Orthopedics;  Laterality: Left;  . KNEE ARTHROSCOPY WITH MEDIAL MENISECTOMY Left 12/23/2016   Procedure: LEFT KNEE ARTHROSCOPY WITH MEDIAL MENISECTOMY CHONDROPLASTY PATELLA  AND MEDIAL FEMORAL CONDYLE LEFT KNEE;  Surgeon: Carole Civil, MD;  Location: [facility];  Service: Orthopedics;  Laterality: Left;  . SHOULDER ARTHROSCOPY WITH SUBACROMIAL DECOMPRESSION AND BICEP TENDON REPAIR Left 12/21/2017   Procedure: Left shoulder arthroscopic biceps tenodesis, SAD, DCR and labrum debridement;  Surgeon: Nicholes Stairs,  MD;  Location: [place];  Service: Orthopedics;  Laterality: Left;  120 mins  . SHOULDER SURGERY     right x 2   . TOTAL KNEE ARTHROPLASTY Left 06/06/2019   Procedure: TOTAL KNEE ARTHROPLASTY;  Surgeon: Paralee Cancel, MD;  Location: [facility];  Service: Orthopedics;  Laterality: Left;  70 mins    Current Outpatient Medications  Medication Sig Dispense Refill  . acetaminophen (TYLENOL) 500 MG tablet Take 2 tablets (1,000 mg total) by mouth every 8 (eight) hours. 30 tablet  0  . albuterol (PROVENTIL) (2.5 MG/3ML) 0.083% nebulizer solution Take 2.5 mg by nebulization every 6 (six) hours as needed. For shortness of breath    . albuterol (VENTOLIN HFA) 108 (90 Base) MCG/ACT inhaler Inhale 1-2 puffs into the lungs every 6 (six) hours as needed for wheezing or shortness of breath.    Marland Kitchen amLODipine (NORVASC) 5 MG tablet Take 5 mg by mouth at bedtime.    . B Complex Vitamins (VITAMIN B COMPLEX 100 IJ) Inject 1,000 mg as directed every 30 (thirty) days.    . cetirizine (ZYRTEC) 10 MG tablet Take 10 mg by mouth daily.    . cyclobenzaprine (FLEXERIL) 10 MG tablet Take 1 tablet (10 mg total) by mouth 3 (three) times daily as needed for muscle spasms. 40 tablet 0  . diphenhydrAMINE (BENADRYL) 25 MG tablet Take 25 mg by mouth every 6 (six) hours as needed for allergies.    Marland Kitchen EPINEPHrine (EPIPEN) 0.3 mg/0.3 mL SOAJ injection Inject 0.3 mLs (0.3 mg total) into the muscle once. 1 Device 0  . famotidine (PEPCID) 40 MG tablet TAKE 1 TABLET BY MOUTH AT BEDTIME. 30 tablet 1  . gabapentin (NEURONTIN) 300 MG capsule Take 1 capsule (300 mg total) by mouth at bedtime. 90 capsule 5  . ibuprofen (ADVIL) 800 MG tablet TAKE 1 TABLET BY MOUTH EVERY 8 HOURS AS NEEDED 90 tablet 1  . losartan (COZAAR) 100 MG tablet Take 100 mg by mouth at bedtime.     . Magnesium 500 MG CAPS Take 2,000 mg by mouth at bedtime.     . meclizine (ANTIVERT) 25 MG tablet Take 25 mg by mouth 3 (three) times daily as needed for dizziness.    . montelukast  (SINGULAIR) 10 MG tablet Take 10 mg by mouth at bedtime.    . Multiple Vitamin (MULTIVITAMIN) tablet Take 1 tablet by mouth daily.    . ondansetron (ZOFRAN) 4 MG tablet Take 4 mg by mouth every 8 (eight) hours as needed for nausea or vomiting.    . pantoprazole (PROTONIX) 40 MG tablet TAKE (1) TABLET BY MOUTH TWICE DAILY BEFORE A MEAL. (Patient taking differently: Take 40 mg by mouth at bedtime. ) 60 tablet 1  . polyethylene glycol (MIRALAX / GLYCOLAX) 17 g packet Take 17 g by mouth 2 (two) times daily. 28 packet 0  . Vitamin D-Vitamin K (K2 PLUS D3 PO) Take 2 capsules by mouth daily.    Marland Kitchen amitriptyline (ELAVIL) 25 MG tablet Take 1 tablet (25 mg total) by mouth at bedtime. 30 tablet 3  . Fremanezumab-vfrm (AJOVY) 225 MG/1.5ML SOAJ Inject 225 mg into the skin every 30 (thirty) days. 1.5 mL 0  . Fremanezumab-vfrm (AJOVY) 225 MG/1.5ML SOAJ Inject 225 mg into the skin every 30 (thirty) days. 4.5 mL 3  . rizatriptan (MAXALT-MLT) 10 MG disintegrating tablet Take 1 tablet (10 mg total) by mouth as needed for migraine. May repeat in 2 hours if needed 9 tablet 11   No current facility-administered medications for this visit.    Allergies as of 06/21/2020 - Review Complete 06/21/2020  Allergen Reaction Noted  . Iodinated diagnostic agents Anaphylaxis and Hives   . Other Shortness Of Breath 05/28/2012  . Wasp venom Anaphylaxis and Shortness Of Breath 08/06/2011  . Pollen extract Swelling 05/28/2012  . Adhesive [tape] Rash 06/03/2012  . Hydromorphone hcl Nausea And Vomiting 08/13/2009  . Omnipaque [iohexol] Hives and Nausea Only 01/02/2011    Vitals: BP (!) 142/100 (BP Location: Left Arm, Patient Position: Sitting,  Cuff Size: Large)   Pulse 90   Ht 5' 11.5" (1.816 m)   Wt 252 lb (114.3 kg)   BMI 34.66 kg/m  Last Weight:  Wt Readings from Last 1 Encounters:  06/21/20 252 lb (114.3 kg)   Last Height:   Ht Readings from Last 1 Encounters:  06/21/20 5' 11.5" (1.816 m)     Physical  exam: Exam: Gen: NAD, conversant, well nourised, obese, well groomed                     CV: RRR, no MRG. No Carotid Bruits. No peripheral edema, warm, nontender Eyes: Conjunctivae clear without exudates or hemorrhage  Neuro: Detailed Neurologic Exam  Speech:    Speech is normal; fluent and spontaneous with normal comprehension.  Cognition:    The patient is oriented to person, place, and time;     recent and remote memory intact;     language fluent;     normal attention, concentration,     fund of knowledge Cranial Nerves:    The pupils are equal, round, and reactive to light. The fundi are flat. Visual fields are full to finger confrontation. Extraocular movements are intact. Trigeminal sensation is intact and the muscles of mastication are normal. The face is symmetric. The palate elevates in the midline. Hearing intact. Voice is normal. Shoulder shrug is normal. The tongue has normal motion without fasciculations.   Coordination:    Normal finger to nose   Gait:    Normal native gait  Motor Observation:    No asymmetry, no atrophy, and no involuntary movements noted. Tone:    Normal muscle tone.    Posture:    Posture is normal. normal erect    Strength:    Strength is V/V in the upper and lower limbs.      Sensation: intact to LT     Reflex Exam:  DTR's:    Deep tendon reflexes in the upper and lower extremities are normal bilaterally.   Toes:    The toes are downgoing bilaterally.   Clonus:    Clonus is absent.    Assessment/Plan:   [name] is a 54 y.o. female here as requested by Sharilyn Sites, MD for headaches.  Past medical history obesity, headache, vitamin D deficiency.  Patient has chronic migraines but she also has multiple other symptoms including  "nerve pain and stiffness" in her extremities, occipital headaches that can be positional, history of obstructive sleep apnea, worsening headaches, vision changes, muscle cramps, likely stress or  mood disorders also affecting her.  - Sleep evaluation, had OSA in the past, morning headaches, fatigue - Start Amitriptyline at bedtime: migraine prevention and sleep - Start Ajovy: migraine prevention: Start injections Ajovy: she is already on multiple oral medications, will start monthly injection - Acute migraines: Rizatriptan. Please take one tablet at the onset of your headache. If it does not improve the symptoms please take one additional tablet. Do not take more then 2 tablets in 24hrs. Do not take use more then 2 to 3 times in a week. - MRI brain: MRI brain due to concerning symptoms of morning headaches, positional headaches,vision changes  to look for space occupying mass, chiari or intracranial hypertension (pseudotumor). - emg/ncs one arm one leg: Muscle pain, cramps, nerve pain - Patient likely has some significant stress and mood disorders, she really needs to follow-up with Dr. Hilma Favors or see psychiatry; discussed psychiatry and therapy with Dr. Hilma Favors.   Orders  Placed This Encounter  Procedures  . MR BRAIN W WO CONTRAST  . TSH  . CK  . Ambulatory referral to Sleep Studies  . NCV with EMG(electromyography)   Meds ordered this encounter  Medications  . amitriptyline (ELAVIL) 25 MG tablet    Sig: Take 1 tablet (25 mg total) by mouth at bedtime.    Dispense:  30 tablet    Refill:  3  . Fremanezumab-vfrm (AJOVY) 225 MG/1.5ML SOAJ    Sig: Inject 225 mg into the skin every 30 (thirty) days.    Dispense:  1.5 mL    Refill:  0    Patient has copay card; she can have medication regardless of insurance approval or copay amount.  . Fremanezumab-vfrm (AJOVY) 225 MG/1.5ML SOAJ    Sig: Inject 225 mg into the skin every 30 (thirty) days.    Dispense:  4.5 mL    Refill:  3    Patient has copay card; she can have medication regardless of insurance approval or copay amount.  . rizatriptan (MAXALT-MLT) 10 MG disintegrating tablet    Sig: Take 1 tablet (10 mg total) by mouth as  needed for migraine. May repeat in 2 hours if needed    Dispense:  9 tablet    Refill:  11    Cc: Sharilyn Sites, MD,    Sarina Ill, MD  [facility] [address] [place], [place] [address]  Phone [phone] Fax [phone]

## 2020-06-21 NOTE — Patient Instructions (Addendum)
MRI brain Start Amitriptyline at bedtime: migraine prevention and sleep Start Ajovy: migraine prevention  Acute migraines: Rizatriptan. Please take one tablet at the onset of your headache. If it does not improve the symptoms please take one additional tablet. Do not take more then 2 tablets in 24hrs. Do not take use more then 2 to 3 times in a week. Sleep study: Will Call  Rizatriptan disintegrating tablets What is this medicine? RIZATRIPTAN (rye za TRIP tan) is used to treat migraines with or without aura. An aura is a strange feeling or visual disturbance that warns you of an attack. It is not used to prevent migraines. This medicine may be used for other purposes; ask your health care provider or pharmacist if you have questions. COMMON BRAND NAME(S): Maxalt-MLT What should I tell my health care provider before I take this medicine? They need to know if you have any of these conditions:  cigarette smoker  circulation problems in fingers and toes  diabetes  heart disease  high blood pressure  high cholesterol  history of irregular heartbeat  history of stroke  kidney disease  liver disease  stomach or intestine problems  an unusual or allergic reaction to rizatriptan, other medicines, foods, dyes, or preservatives  pregnant or trying to get pregnant  breast-feeding How should I use this medicine? Take this medicine by mouth. Follow the directions on the prescription label. Leave the tablet in the sealed blister pack until you are ready to take it. With dry hands, open the blister and gently remove the tablet. If the tablet breaks or crumbles, throw it away and take a new tablet out of the blister pack. Place the tablet in the mouth and allow it to dissolve, and then swallow. Do not cut, crush, or chew this medicine. You do not need water to take this medicine. Do not take it more often than directed. Talk to your pediatrician regarding the use of this medicine in  children. While this drug may be prescribed for children as young as 6 years for selected conditions, precautions do apply. Overdosage: If you think you have taken too much of this medicine contact a poison control center or emergency room at once. NOTE: This medicine is only for you. Do not share this medicine with others. What if I miss a dose? This does not apply. This medicine is not for regular use. What may interact with this medicine? Do not take this medicine with any of the following medicines:  certain medicines for migraine headache like almotriptan, eletriptan, frovatriptan, naratriptan, rizatriptan, sumatriptan, zolmitriptan  ergot alkaloids like dihydroergotamine, ergonovine, ergotamine, methylergonovine  MAOIs like Carbex, Eldepryl, Marplan, Nardil, and Parnate This medicine may also interact with the following medications:  certain medicines for depression, anxiety, or psychotic disorders  propranolol This list may not describe all possible interactions. Give your health care provider a list of all the medicines, herbs, non-prescription drugs, or dietary supplements you use. Also tell them if you smoke, drink alcohol, or use illegal drugs. Some items may interact with your medicine. What should I watch for while using this medicine? Visit your healthcare professional for regular checks on your progress. Tell your healthcare professional if your symptoms do not start to get better or if they get worse. You may get drowsy or dizzy. Do not drive, use machinery, or do anything that needs mental alertness until you know how this medicine affects you. Do not stand up or sit up quickly, especially if you are an older  patient. This reduces the risk of dizzy or fainting spells. Alcohol may interfere with the effect of this medicine. Your mouth may get dry. Chewing sugarless gum or sucking hard candy and drinking plenty of water may help. Contact your healthcare professional if the  problem does not go away or is severe. If you take migraine medicines for 10 or more days a month, your migraines may get worse. Keep a diary of headache days and medicine use. Contact your healthcare professional if your migraine attacks occur more frequently. What side effects may I notice from receiving this medicine? Side effects that you should report to your doctor or health care professional as soon as possible:  allergic reactions like skin rash, itching or hives, swelling of the face, lips, or tongue  chest pain or chest tightness  signs and symptoms of a dangerous change in heartbeat or heart rhythm like chest pain; dizziness; fast, irregular heartbeat; palpitations; feeling faint or lightheaded; falls; breathing problems  signs and symptoms of a stroke like changes in vision; confusion; trouble speaking or understanding; severe headaches; sudden numbness or weakness of the face, arm or leg; trouble walking; dizziness; loss of balance or coordination  signs and symptoms of serotonin syndrome like irritable; confusion; diarrhea; fast or irregular heartbeat; muscle twitching; stiff muscles; trouble walking; sweating; high fever; seizures; chills; vomiting Side effects that usually do not require medical attention (report to your doctor or health care professional if they continue or are bothersome):  diarrhea  dizziness  drowsiness  dry mouth  headache  nausea, vomiting  pain, tingling, numbness in the hands or feet  stomach pain This list may not describe all possible side effects. Call your doctor for medical advice about side effects. You may report side effects to FDA at 1-800-FDA-1088. Where should I keep my medicine? Keep out of the reach of children. Store at room temperature between 15 and 30 degrees C (59 and 86 degrees F). Protect from light and moisture. Throw away any unused medicine after the expiration date. NOTE: This sheet is a summary. It may not cover all  possible information. If you have questions about this medicine, talk to your doctor, pharmacist, or health care provider.  2020 Elsevier/Gold Standard (2018-04-05 14:58:08)  Amitriptyline tablets What is this medicine? AMITRIPTYLINE (a mee TRIP ti leen) is used to treat depression. This medicine may be used for other purposes; ask your health care provider or pharmacist if you have questions. COMMON BRAND NAME(S): Elavil, Vanatrip What should I tell my health care provider before I take this medicine? They need to know if you have any of these conditions:  an alcohol problem  asthma, difficulty breathing  bipolar disorder or schizophrenia  difficulty passing urine, prostate trouble  glaucoma  heart disease or previous heart attack  liver disease  over active thyroid  seizures  thoughts or plans of suicide, a previous suicide attempt, or family history of suicide attempt  an unusual or allergic reaction to amitriptyline, other medicines, foods, dyes, or preservatives  pregnant or trying to get pregnant  breast-feeding How should I use this medicine? Take this medicine by mouth with a drink of water. Follow the directions on the prescription label. You can take the tablets with or without food. Take your medicine at regular intervals. Do not take it more often than directed. Do not stop taking this medicine suddenly except upon the advice of your doctor. Stopping this medicine too quickly may cause serious side effects or your condition may  worsen. A special MedGuide will be given to you by the pharmacist with each prescription and refill. Be sure to read this information carefully each time. Talk to your pediatrician regarding the use of this medicine in children. Special care may be needed. Overdosage: If you think you have taken too much of this medicine contact a poison control center or emergency room at once. NOTE: This medicine is only for you. Do not share this  medicine with others. What if I miss a dose? If you miss a dose, take it as soon as you can. If it is almost time for your next dose, take only that dose. Do not take double or extra doses. What may interact with this medicine? Do not take this medicine with any of the following medications:  arsenic trioxide  certain medicines used to regulate abnormal heartbeat or to treat other heart conditions  cisapride  droperidol  halofantrine  linezolid  MAOIs like Carbex, Eldepryl, Marplan, Nardil, and Parnate  methylene blue  other medicines for mental depression  phenothiazines like perphenazine, thioridazine and chlorpromazine  pimozide  probucol  procarbazine  sparfloxacin  St. John's Wort This medicine may also interact with the following medications:  atropine and related drugs like hyoscyamine, scopolamine, tolterodine and others  barbiturate medicines for inducing sleep or treating seizures, like phenobarbital  cimetidine  disulfiram  ethchlorvynol  thyroid hormones such as levothyroxine  ziprasidone This list may not describe all possible interactions. Give your health care provider a list of all the medicines, herbs, non-prescription drugs, or dietary supplements you use. Also tell them if you smoke, drink alcohol, or use illegal drugs. Some items may interact with your medicine. What should I watch for while using this medicine? Tell your doctor if your symptoms do not get better or if they get worse. Visit your doctor or health care professional for regular checks on your progress. Because it may take several weeks to see the full effects of this medicine, it is important to continue your treatment as prescribed by your doctor. Patients and their families should watch out for new or worsening thoughts of suicide or depression. Also watch out for sudden changes in feelings such as feeling anxious, agitated, panicky, irritable, hostile, aggressive, impulsive,  severely restless, overly excited and hyperactive, or not being able to sleep. If this happens, especially at the beginning of treatment or after a change in dose, call your health care professional. Dennis Bast may get drowsy or dizzy. Do not drive, use machinery, or do anything that needs mental alertness until you know how this medicine affects you. Do not stand or sit up quickly, especially if you are an older patient. This reduces the risk of dizzy or fainting spells. Alcohol may interfere with the effect of this medicine. Avoid alcoholic drinks. Do not treat yourself for coughs, colds, or allergies without asking your doctor or health care professional for advice. Some ingredients can increase possible side effects. Your mouth may get dry. Chewing sugarless gum or sucking hard candy, and drinking plenty of water will help. Contact your doctor if the problem does not go away or is severe. This medicine may cause dry eyes and blurred vision. If you wear contact lenses you may feel some discomfort. Lubricating drops may help. See your eye doctor if the problem does not go away or is severe. This medicine can cause constipation. Try to have a bowel movement at least every 2 to 3 days. If you do not have a bowel movement  for 3 days, call your doctor or health care professional. This medicine can make you more sensitive to the sun. Keep out of the sun. If you cannot avoid being in the sun, wear protective clothing and use sunscreen. Do not use sun lamps or tanning beds/booths. What side effects may I notice from receiving this medicine? Side effects that you should report to your doctor or health care professional as soon as possible:  allergic reactions like skin rash, itching or hives, swelling of the face, lips, or tongue  anxious  breathing problems  changes in vision  confusion  elevated mood, decreased need for sleep, racing thoughts, impulsive behavior  eye pain  fast, irregular  heartbeat  feeling faint or lightheaded, falls  feeling agitated, angry, or irritable  fever with increased sweating  hallucination, loss of contact with reality  seizures  stiff muscles  suicidal thoughts or other mood changes  tingling, pain, or numbness in the feet or hands  trouble passing urine or change in the amount of urine  trouble sleeping  unusually weak or tired  vomiting  yellowing of the eyes or skin Side effects that usually do not require medical attention (report to your doctor or health care professional if they continue or are bothersome):  change in sex drive or performance  change in appetite or weight  constipation  dizziness  dry mouth  nausea  tired  tremors  upset stomach This list may not describe all possible side effects. Call your doctor for medical advice about side effects. You may report side effects to FDA at 1-800-FDA-1088. Where should I keep my medicine? Keep out of the reach of children. Store at room temperature between 20 and 25 degrees C (68 and 77 degrees F). Throw away any unused medicine after the expiration date. NOTE: This sheet is a summary. It may not cover all possible information. If you have questions about this medicine, talk to your doctor, pharmacist, or health care provider.  2020 Elsevier/Gold Standard (2018-09-13 13:04:32)  Rolanda Lundborg injection What is this medicine? FREMANEZUMAB (fre ma NEZ ue mab) is used to prevent migraine headaches. This medicine may be used for other purposes; ask your health care provider or pharmacist if you have questions. COMMON BRAND NAME(S): AJOVY What should I tell my health care provider before I take this medicine? They need to know if you have any of these conditions: an unusual or allergic reaction to fremanezumab, other medicines, foods, dyes, or preservatives pregnant or trying to get pregnant breast-feeding How should I use this medicine? This medicine is for  injection under the skin. You will be taught how to prepare and give this medicine. Use exactly as directed. Take your medicine at regular intervals. Do not take your medicine more often than directed. It is important that you put your used needles and syringes in a special sharps container. Do not put them in a trash can. If you do not have a sharps container, call your pharmacist or healthcare provider to get one. Talk to your pediatrician regarding the use of this medicine in children. Special care may be needed. Overdosage: If you think you have taken too much of this medicine contact a poison control center or emergency room at once. NOTE: This medicine is only for you. Do not share this medicine with others. What if I miss a dose? If you miss a dose, take it as soon as you can. If it is almost time for your next dose, take only that  dose. Do not take double or extra doses. What may interact with this medicine? Interactions are not expected. This list may not describe all possible interactions. Give your health care provider a list of all the medicines, herbs, non-prescription drugs, or dietary supplements you use. Also tell them if you smoke, drink alcohol, or use illegal drugs. Some items may interact with your medicine. What should I watch for while using this medicine? Tell your doctor or healthcare professional if your symptoms do not start to get better or if they get worse. What side effects may I notice from receiving this medicine? Side effects that you should report to your doctor or health care professional as soon as possible: allergic reactions like skin rash, itching or hives, swelling of the face, lips, or tongue Side effects that usually do not require medical attention (report these to your doctor or health care professional if they continue or are bothersome): pain, redness, or irritation at site where injected This list may not describe all possible side effects. Call your  doctor for medical advice about side effects. You may report side effects to FDA at 1-800-FDA-1088. Where should I keep my medicine? Keep out of the reach of children. You will be instructed on how to store this medicine. Throw away any unused medicine after the expiration date on the label. NOTE: This sheet is a summary. It may not cover all possible information. If you have questions about this medicine, talk to your doctor, pharmacist, or health care provider.  2020 Elsevier/Gold Standard (2017-06-21 17:22:56)

## 2020-06-22 ENCOUNTER — Encounter: Payer: Self-pay | Admitting: Neurology

## 2020-06-22 DIAGNOSIS — G43709 Chronic migraine without aura, not intractable, without status migrainosus: Secondary | ICD-10-CM | POA: Insufficient documentation

## 2020-06-22 NOTE — Addendum Note (Signed)
Addended by: Sarina Ill B on: 06/22/2020 10:31 PM   Modules accepted: Orders

## 2020-06-24 ENCOUNTER — Telehealth: Payer: Self-pay | Admitting: Emergency Medicine

## 2020-06-24 ENCOUNTER — Telehealth: Payer: Self-pay | Admitting: Neurology

## 2020-06-24 NOTE — Telephone Encounter (Signed)
LVM for pt to call back about scheduling mri  [facility] auth: [place] ref # [id number]

## 2020-06-25 NOTE — Telephone Encounter (Signed)
Ajovy PA Started [place] PA on [facility], [place] [id number]  Awaiting determination.

## 2020-06-27 NOTE — Telephone Encounter (Signed)
Per CMM, Ajovy has been denied.  Amovig and Emgality trials are both required prior.  Appeals may be faxed to Fax [phone], must be faxed within 180 days, electronic appeals available for 180 days.

## 2020-07-01 MED FILL — AJOVY 225 MG/1.5ML SOAJ: 225 | 30 days supply | Qty: 2 | Fill #0

## 2020-07-01 NOTE — Telephone Encounter (Signed)
Patient returned my call she is scheduled at [facility] for 07/03/20.

## 2020-07-03 ENCOUNTER — Ambulatory Visit: Payer: 59

## 2020-07-03 ENCOUNTER — Other Ambulatory Visit: Payer: Self-pay

## 2020-07-03 DIAGNOSIS — R51 Headache with orthostatic component, not elsewhere classified: Secondary | ICD-10-CM | POA: Diagnosis not present

## 2020-07-03 DIAGNOSIS — R519 Headache, unspecified: Secondary | ICD-10-CM | POA: Diagnosis not present

## 2020-07-03 DIAGNOSIS — H539 Unspecified visual disturbance: Secondary | ICD-10-CM

## 2020-07-03 MED ORDER — GADOBENATE DIMEGLUMINE 529 MG/ML IV SOLN
20.0000 mL | Freq: Once | INTRAVENOUS | Status: AC | PRN
  Administered 2020-07-03: 20 mL via INTRAVENOUS

## 2020-07-04 ENCOUNTER — Telehealth: Payer: Self-pay | Admitting: Neurology

## 2020-07-04 NOTE — Telephone Encounter (Signed)
The MRI findings showed some possibly increased fluid in the brain or may be benign findings. I would like to discuss with her at emg/ncs thanks

## 2020-07-04 NOTE — Telephone Encounter (Signed)
I spoke with the patient and we discussed the MRI findings per Dr Jaynee Eagles. Pt aware results are available for review in mychart. She verbalized understanding and will discuss this further with Dr Jaynee Eagles next week at her EMG. Her questions about the EMG were answered.

## 2020-07-05 ENCOUNTER — Other Ambulatory Visit (HOSPITAL_COMMUNITY): Payer: Self-pay | Admitting: Orthopedic Surgery

## 2020-07-05 DIAGNOSIS — D511 Vitamin B12 deficiency anemia due to selective vitamin B12 malabsorption with proteinuria: Secondary | ICD-10-CM | POA: Diagnosis not present

## 2020-07-05 MED FILL — LOSARTAN POTASSIUM 100 MG T: 100 | 90 days supply | Qty: 90 | Fill #1

## 2020-07-05 MED FILL — CYCLOBENZAPRINE HCL 10 MG T: 10 | 10 days supply | Qty: 30 | Fill #0

## 2020-07-10 NOTE — Progress Notes (Addendum)
Emg/ncs of one arm and one leg for muscle pain, cramps, nerve pain. She reports her hands cramp up. Her lower calfs cramp up. She has low back stiffness. She has some shooting pain into the left leg.More stiffness and cramping than paresthesias. MRI of the brain showed partially empty sella and enlarged optic nerve sheaths. We started her on amitriptyline and Ajovy for her migraines/headaches. 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 We sent her for sleep evaluation bu she is going to hold off for now.We reviewed MRI brain. She is going to follow up with dr. Estanislado Pandy and she has an ophthalmology appointment in December. Brought her to lab today prior to emg needle study to get the CK and TSH.  Headaches: The partially empty sella and enlarged optic nerve sheaths can be seen in IDIOPATHIC INTRACRANIAL HYPERTENSION. We discussed that today. Options include LP with opening pressure, she has an ophthalmology appointment in December to eval for papilledema(not appreciated on my exam). I recommend an LP and we have ordered that,we can start diamox if elevated opening pressure confirmed.   We discussed her emg today: She has normal sensory conductions and normal EMG. Both Tibial motor nerves have low amplitudes but with normal sensory conductions may be due to remote radiculopathy, no acute/ongoing denervation on emg/ncs to think she has current radiculopathy. But her symptoms may be rheumatologic, she follow with Dr. Estanislado Pandy and lumbosacral/cervical degenerative disease may also be causing some symptoms. Nothing acute or ongoing seen on exam today, no muscle disease, no peripheral polyneuropathy.   Patient called and asked for optho referral, will provide 07/16/2020.  I spent 20 minutes of face-to-face and non-face-to-face time with patient on the  1. IIH (idiopathic intracranial hypertension)   2. Muscle cramp   3.  Nerve pain   4. Myalgia   5. Other fatigue    diagnosis.  This included previsit chart review, lab review, study review, order entry, electronic health record documentation, patient education on the different diagnostic and therapeutic options, counseling and coordination of care, risks and benefits of management, compliance, or risk factor reduction. This does not include time spent on emg/ncs.

## 2020-07-11 ENCOUNTER — Ambulatory Visit: Payer: 59 | Admitting: Neurology

## 2020-07-11 ENCOUNTER — Other Ambulatory Visit: Payer: Self-pay

## 2020-07-11 ENCOUNTER — Ambulatory Visit (INDEPENDENT_AMBULATORY_CARE_PROVIDER_SITE_OTHER): Payer: 59 | Admitting: Neurology

## 2020-07-11 DIAGNOSIS — G932 Benign intracranial hypertension: Secondary | ICD-10-CM | POA: Diagnosis not present

## 2020-07-11 DIAGNOSIS — R5383 Other fatigue: Secondary | ICD-10-CM | POA: Diagnosis not present

## 2020-07-11 DIAGNOSIS — M791 Myalgia, unspecified site: Secondary | ICD-10-CM

## 2020-07-11 DIAGNOSIS — Z0289 Encounter for other administrative examinations: Secondary | ICD-10-CM

## 2020-07-11 DIAGNOSIS — R252 Cramp and spasm: Secondary | ICD-10-CM

## 2020-07-11 DIAGNOSIS — M792 Neuralgia and neuritis, unspecified: Secondary | ICD-10-CM

## 2020-07-11 NOTE — Progress Notes (Signed)
Full Name: [name] Gender: Female MRN #: [id number] Date of Birth: 1966-03-10    Visit Date: 07/11/2020 07:44 Age: 54 Years Examining Physician: Sarina Ill, MD  Referring Physician: Sharilyn Sites, MD Height: 5 feet 11 inch  History: Muscle cramps and pain, nerve pain  Summary: EMG/NCS was performed on the left upper and bilateral lower extremities.  The left peroneal motor nerve showed decreased conduction velocity (fibular head to ankle, 38 m/s, normal greater than 44) with an 11 m/s drop across the popliteal fossa.  The left tibial motor nerve showed reduced amplitude (1.8 mV, normal greater than 4) and decreased conduction velocity (37 m/s, normal greater than 41).   The right tibial motor nerve showed reduced amplitude (2.0 mV, normal greater than 4) and decreased conduction velocity (37 m/s, normal greater than 41). The left Tibial F Wave showed delayed latency(71.9, N< 68). The left Tibial F Wave showed delayed latency(71.2, N< 25). All remaining nerves (as indicated in the following tables) were within normal limits.  All muscles (as indicated in the following tables) were within normal limits.       Conclusion: Sensory nerve conductions were within normal limits.  Reduced amplitude of the bilateral tibial motor nerves could be due to remote lumbosacral radiculopathy given normal sensory conductions; EMG muscle study did not show any acute/ongoing denervation to suggest acute radiculopathy.  Mild slowing across the left fibular head may be due to prior surgery versus a mild peroneal neuropathy.  None of these findings would account for patient's diffuse symptoms.  Sarina Ill, M.D.  [facility] [place], [place] [address] Tel: [phone] Fax: [phone]  Verbal informed consent was obtained from the patient, patient was informed of potential risk of procedure, including bruising, bleeding, hematoma formation, infection, muscle  weakness, muscle pain, numbness, among others.         [place]    Nerve / Sites Muscle Latency Ref. Amplitude Ref. Rel Amp Segments Distance Velocity Ref. Area    ms ms mV mV %  cm m/s m/s mVms  L Median - APB     Wrist APB 3.6 ?4.4 6.2 ?4.0 100 Wrist - APB 7   18.7     Upper arm APB 8.3  6.3  101 Upper arm - Wrist 25 54 ?49 21.4  L Ulnar - ADM     Wrist ADM 3.0 ?3.3 13.0 ?6.0 100 Wrist - ADM 7   39.2     B.Elbow ADM 6.6  10.7  82.4 B.Elbow - Wrist 21 58 ?49 36.8     A.Elbow ADM 8.4  10.0  93.2 A.Elbow - B.Elbow 10 56 ?49 35.7         A.Elbow - Wrist      L Peroneal - EDB     Ankle EDB 5.3 ?6.5 4.5 ?2.0 100 Ankle - EDB 9   15.7     Fib head EDB 13.2  2.6  57.9 Fib head - Ankle 30 38 ?44 9.3     Pop fossa EDB 15.3  3.7  140 Pop fossa - Fib head 10 49 ?44 14.5         Pop fossa - Ankle      R Peroneal - EDB     Ankle EDB 5.4 ?6.5 5.7 ?2.0 100 Ankle - EDB 9   21.5     Fib head EDB 12.3  4.9  85.1 Fib head - Ankle 30 44 ?44 19.9  Pop fossa EDB 14.6  4.7  96.5 Pop fossa - Fib head 10 44 ?44 19.9         Pop fossa - Ankle      L Tibial - AH     Ankle AH 5.2 ?5.8 1.8 ?4.0 100 Ankle - AH 9   3.5     Pop fossa AH 16.0  1.9  106 Pop fossa - Ankle 40 37 ?41 6.5  R Tibial - AH     Ankle AH 6.4 ?5.8 2.0 ?4.0 100 Ankle - AH 9   4.2     Pop fossa AH 17.2  1.8  91.2 Pop fossa - Ankle 40 37 ?41 3.4                   SNC    Nerve / Sites Rec. Site Peak Lat Ref.  Amp Ref. Segments Distance Peak Diff Ref.    ms ms V V  cm ms ms  L Sural - Ankle (Calf)     Calf Ankle 4.2 ?4.4 10 ?6 Calf - Ankle 14    R Sural - Ankle (Calf)     Calf Ankle 3.4 ?4.4 25 ?6 Calf - Ankle 14    L Superficial peroneal - Ankle     Lat leg Ankle 3.9 ?4.4 7 ?6 Lat leg - Ankle 14    R Superficial peroneal - Ankle     Lat leg Ankle 4.2 ?4.4 8 ?6 Lat leg - Ankle 14    L Median, Ulnar - Transcarpal comparison     Median Palm Wrist 2.0 ?2.2 67 ?35 Median Palm - Wrist 8       Ulnar Palm Wrist 1.8 ?2.2 34 ?12 Ulnar Palm -  Wrist 8          Median Palm - Ulnar Palm  0.2 ?0.4  L Median - Orthodromic (Dig II, Mid palm)     Dig II Wrist 2.9 ?3.4 20 ?10 Dig II - Wrist 13    L Ulnar - Orthodromic, (Dig V, Mid palm)     Dig V Wrist 2.5 ?3.1 17 ?5 Dig V - Wrist 72                     F  Wave    Nerve F Lat Ref.   ms ms  L Tibial - AH 71.9 ?56.0  L Ulnar - ADM 29.5 ?32.0  R Tibial - AH 71.2 ?56.0           EMG Summary Table    Spontaneous MUAP Recruitment  Muscle IA Fib PSW Fasc Other Amp Dur. Poly Pattern  L. Deltoid Normal None None None _______ Normal Normal Normal Normal  L. Triceps brachii Normal None None None _______ Normal Normal Normal Normal  L. Pronator teres Normal None None None _______ Normal Normal Normal Normal  L. First dorsal interosseous Normal None None None _______ Normal Normal Normal Normal  L. Opponens pollicis Normal None None None _______ Normal Normal Normal Normal  L. Vastus medialis Normal None None None _______ Normal Normal Normal Normal  L. Tibialis anterior Normal None None None _______ Normal Normal Normal Normal  L. Gastrocnemius (Medial head) Normal None None None _______ Normal Normal Normal Normal  L. Extensor hallucis longus Normal None None None _______ Normal Normal Normal Reduced  L. Biceps femoris (long head) Normal None None None _______ Normal Normal Normal Normal  L. Gluteus maximus Normal None None None _______ Normal  Normal Normal Normal  L. Gluteus medius Normal None None None _______ Normal Normal Normal Normal  L. Cervical paraspinals (low) Normal None None None _______ Normal Normal Normal Normal  L. Lumbar paraspinals (low) Normal None None None _______ Normal Normal Normal Normal  R. Vastus medialis Normal None None None _______ Normal Normal Normal Normal  R. Tibialis anterior Normal None None None _______ Normal Normal Normal Reduced  R. Gastrocnemius (Medial head) Normal None None None _______ Normal Normal Normal Normal  R. Lumbar paraspinals (low)  Normal None None None _______ Normal Normal Normal Normal  R. Extensor hallucis longus Normal None None None _______ Increased Increased 2+ Reduced  R. Gluteus maximus Normal None None None _______ Normal Normal Normal Normal  R. Biceps femoris (long head) Normal None None None _______ Normal Normal Normal Normal

## 2020-07-11 NOTE — Progress Notes (Signed)
See procedure note.

## 2020-07-11 NOTE — Patient Instructions (Addendum)
Lumbar puncture If IDIOPATHIC INTRACRANIAL HYPERTENSION confirmed we can start Topamax   Idiopathic Intracranial Hypertension  Idiopathic intracranial hypertension (IIH) is a condition that increases pressure around the brain. The fluid that surrounds the brain and spinal cord (cerebrospinal fluid, CSF) increases and causes the pressure. Idiopathic means that the cause of this condition is not known. IIH affects the brain and spinal cord (is a neurological disorder). If this condition is not treated, it can cause vision loss or blindness. What increases the risk? You are more likely to develop this condition if:  You are severely overweight (obese).  You are a woman who has not gone through menopause.  You take certain medicines, such as birth control or steroids. What are the signs or symptoms? Symptoms of IIH include:  Headaches. This is the most common symptom.  Pain in the shoulders or neck.  Nausea and vomiting.  A "rushing water" or pulsing sound within the ears (pulsatile tinnitus).  Double vision.  Blurred vision.  Brief episodes of complete vision loss. How is this diagnosed? This condition may be diagnosed based on:  Your symptoms.  Your medical history.  CT scan of the brain.  MRI of the brain.  Magnetic resonance venogram (MRV) to check veins in the brain.  Diagnostic lumbar puncture. This is a procedure to remove and examine a sample of cerebrospinal fluid. This procedure can determine whether too much fluid may be causing IIH.  A thorough eye exam to check for swelling or nerve damage in the eyes. How is this treated? Treatment for this condition depends on your symptoms. The goal of treatment is to decrease the pressure around your brain. Common treatments include:  Medicines to decrease the production of spinal fluid and lower the pressure within your skull.  Medicines to prevent or treat headaches.  Surgery to place drains (shunts) in your brain  to remove excess fluid.  Lumbar puncture to remove excess cerebrospinal fluid. Follow these instructions at home:  If you are overweight or obese, work with your health care provider to lose weight.  Take over-the-counter and prescription medicines only as told by your health care provider.  Do not drive or use heavy machinery while taking medicines that can make you sleepy.  Keep all follow-up visits as told by your health care provider. This is important. Contact a health care provider if:  You have changes in your vision, such as: ? Double vision. ? Not being able to see colors (color vision). Get help right away if:  You have any of the following symptoms and they get worse or do not get better. ? Headaches. ? Nausea. ? Vomiting. ? Vision changes or difficulty seeing. Summary  Idiopathic intracranial hypertension (IIH) is a condition that increases pressure around the brain. The cause is not known (is idiopathic).  The most common symptom of IIH is headaches.  Treatment may include medicines or surgery to relieve the pressure on your brain. This information is not intended to replace advice given to you by your health care provider. Make sure you discuss any questions you have with your health care provider. Document Revised: 09/03/2017 Document Reviewed: 08/12/2016 Elsevier Patient Education  [place].     Topiramate tablets What is this medicine? TOPIRAMATE (toe PYRE a mate) is used to treat seizures in adults or children with epilepsy. It is also used for the prevention of migraine headaches. This medicine may be used for other purposes; ask your health care provider or pharmacist if you  have questions. COMMON BRAND NAME(S): Topamax, Topiragen What should I tell my health care provider before I take this medicine? They need to know if you have any of these conditions:  bleeding disorders  kidney disease  lung or breathing disease, like  asthma  suicidal thoughts, plans, or attempt; a previous suicide attempt by you or a family member  an unusual or allergic reaction to topiramate, other medicines, foods, dyes, or preservatives  pregnant or trying to get pregnant  breast-feeding How should I use this medicine? Take this medicine by mouth with a glass of water. Follow the directions on the prescription label. Do not cut, crush or chew this medicine. Swallow the tablets whole. You can take it with or without food. If it upsets your stomach, take it with food. Take your medicine at regular intervals. Do not take it more often than directed. Do not stop taking except on your doctor's advice. A special MedGuide will be given to you by the pharmacist with each prescription and refill. Be sure to read this information carefully each time. Talk to your pediatrician regarding the use of this medicine in children. While this drug may be prescribed for children as young as 68 years of age for selected conditions, precautions do apply. Overdosage: If you think you have taken too much of this medicine contact a poison control center or emergency room at once. NOTE: This medicine is only for you. Do not share this medicine with others. What if I miss a dose? If you miss a dose, take it as soon as you can. If your next dose is to be taken in less than 6 hours, then do not take the missed dose. Take the next dose at your regular time. Do not take double or extra doses. What may interact with this medicine? This medicine may interact with the following medications:  acetazolamide  alcohol  antihistamines for allergy, cough, and cold  aspirin and aspirin-like medicines  atropine  birth control pills  certain medicines for anxiety or sleep  certain medicines for bladder problems like oxybutynin, tolterodine  certain medicines for depression like amitriptyline, fluoxetine, sertraline  certain medicines for seizures like  carbamazepine, phenobarbital, phenytoin, primidone, valproic acid, zonisamide  certain medicines for stomach problems like dicyclomine, hyoscyamine  certain medicines for travel sickness like scopolamine  certain medicines for Parkinson's disease like benztropine, trihexyphenidyl  certain medicines that treat or prevent blood clots like warfarin, enoxaparin, dalteparin, apixaban, dabigatran, and rivaroxaban  digoxin  general anesthetics like halothane, isoflurane, methoxyflurane, propofol  hydrochlorothiazide  ipratropium  lithium  medicines that relax muscles for surgery  metformin  narcotic medicines for pain  NSAIDs, medicines for pain and inflammation, like ibuprofen or naproxen  phenothiazines like chlorpromazine, mesoridazine, prochlorperazine, thioridazine  pioglitazone This list may not describe all possible interactions. Give your health care provider a list of all the medicines, herbs, non-prescription drugs, or dietary supplements you use. Also tell them if you smoke, drink alcohol, or use illegal drugs. Some items may interact with your medicine. What should I watch for while using this medicine? Visit your doctor or health care professional for regular checks on your progress. Tell your health care professional if your symptoms do not start to get better or if they get worse. Do not stop taking except on your health care professional's advice. You may develop a severe reaction. Your health care professional will tell you how much medicine to take. Wear a medical ID bracelet or chain. Carry a card  that describes your disease and details of your medicine and dosage times. This medicine can reduce the response of your body to heat or cold. Dress warm in cold weather and stay hydrated in hot weather. If possible, avoid extreme temperatures like saunas, hot tubs, very hot or cold showers, or activities that can cause dehydration such as vigorous exercise. Check with your  health care professional if you have severe diarrhea, nausea, and vomiting, or if you sweat a lot. The loss of too much body fluid may make it dangerous for you to take this medicine. You may get drowsy or dizzy. Do not drive, use machinery, or do anything that needs mental alertness until you know how this medicine affects you. Do not stand up or sit up quickly, especially if you are an older patient. This reduces the risk of dizzy or fainting spells. Alcohol may interfere with the effect of this medicine. Avoid alcoholic drinks. Tell your health care professional right away if you have any change in your eyesight. Patients and their families should watch out for new or worsening depression or thoughts of suicide. Also watch out for sudden changes in feelings such as feeling anxious, agitated, panicky, irritable, hostile, aggressive, impulsive, severely restless, overly excited and hyperactive, or not being able to sleep. If this happens, especially at the beginning of treatment or after a change in dose, call your healthcare professional. This medicine may cause serious skin reactions. They can happen weeks to months after starting the medicine. Contact your health care provider right away if you notice fevers or flu-like symptoms with a rash. The rash may be red or purple and then turn into blisters or peeling of the skin. Or, you might notice a red rash with swelling of the face, lips or lymph nodes in your neck or under your arms. Birth control may not work properly while you are taking this medicine. Talk to your health care professional about using an extra method of birth control. Women should inform their health care professional if they wish to become pregnant or think they might be pregnant. There is a potential for serious side effects and harm to an unborn child. Talk to your health care professional for more information. What side effects may I notice from receiving this medicine? Side effects  that you should report to your doctor or health care professional as soon as possible:  allergic reactions like skin rash, itching or hives, swelling of the face, lips, or tongue  blood in the urine  changes in vision  confusion  loss of memory  pain in lower back or side  pain when urinating  redness, blistering, peeling or loosening of the skin, including inside the mouth  signs and symptoms of bleeding such as bloody or black, tarry stools; red or dark brown urine; spitting up blood or brown material that looks like coffee grounds; red spots on the skin; unusual bruising or bleeding from the eyes, gums, or nose  signs and symptoms of increased acid in the body like breathing fast; fast heartbeat; headache; confusion; unusually weak or tired; nausea, vomiting  suicidal thoughts, mood changes  trouble speaking or understanding  unusual sweating  unusually weak or tired Side effects that usually do not require medical attention (report to your doctor or health care professional if they continue or are bothersome):  dizziness  drowsiness  fever  loss of appetite  nausea, vomiting  pain, tingling, numbness in the hands or feet  stomach pain  tiredness  upset stomach This list may not describe all possible side effects. Call your doctor for medical advice about side effects. You may report side effects to FDA at 1-800-FDA-1088. Where should I keep my medicine? Keep out of the reach of children. Store at room temperature between 15 and 30 degrees C (59 and 86 degrees F). Throw away any unused medicine after the expiration date. NOTE: This sheet is a summary. It may not cover all possible information. If you have questions about this medicine, talk to your doctor, pharmacist, or health care provider.  2020 Elsevier/Gold Standard (2019-04-20 15:07:20)

## 2020-07-12 LAB — TSH: TSH: 0.88 u[IU]/mL (ref 0.450–4.500)

## 2020-07-12 LAB — CK: Total CK: 100 U/L (ref 32–182)

## 2020-07-15 NOTE — Procedures (Signed)
Full Name: [name] Gender: Female MRN #: [id number] Date of Birth: 16-May-1966    Visit Date: 07/11/2020 07:44 Age: 54 Years Examining Physician: Sarina Ill, MD  Referring Physician: Sharilyn Sites, MD Height: 5 feet 11 inch  History: Muscle cramps and pain, nerve pain  Summary: EMG/NCS was performed on the left upper and bilateral lower extremities.  The left peroneal motor nerve showed decreased conduction velocity (fibular head to ankle, 38 m/s, normal greater than 44) with an 11 m/s drop across the popliteal fossa.  The left tibial motor nerve showed reduced amplitude (1.8 mV, normal greater than 4) and decreased conduction velocity (37 m/s, normal greater than 41).   The right tibial motor nerve showed reduced amplitude (2.0 mV, normal greater than 4) and decreased conduction velocity (37 m/s, normal greater than 41). The left Tibial F Wave showed delayed latency(71.9, N< 54). The left Tibial F Wave showed delayed latency(71.2, N< 38). All remaining nerves (as indicated in the following tables) were within normal limits.  All muscles (as indicated in the following tables) were within normal limits.       Conclusion: Sensory nerve conductions were within normal limits.  Reduced amplitude of the bilateral tibial motor nerves could be due to remote lumbosacral radiculopathy given normal sensory conductions; EMG muscle study did not show any acute/ongoing denervation to suggest acute radiculopathy.  Mild slowing across the left fibular head may be due to prior surgery versus a mild peroneal neuropathy.  None of these findings would account for patient's diffuse symptoms.  Sarina Ill, M.D.  [facility] [place], [place] [address] Tel: [phone] Fax: [phone]  Verbal informed consent was obtained from the patient, patient was informed of potential risk of procedure, including bruising, bleeding, hematoma formation, infection, muscle  weakness, muscle pain, numbness, among others.         [place]    Nerve / Sites Muscle Latency Ref. Amplitude Ref. Rel Amp Segments Distance Velocity Ref. Area    ms ms mV mV %  cm m/s m/s mVms  L Median - APB     Wrist APB 3.6 ?4.4 6.2 ?4.0 100 Wrist - APB 7   18.7     Upper arm APB 8.3  6.3  101 Upper arm - Wrist 25 54 ?49 21.4  L Ulnar - ADM     Wrist ADM 3.0 ?3.3 13.0 ?6.0 100 Wrist - ADM 7   39.2     B.Elbow ADM 6.6  10.7  82.4 B.Elbow - Wrist 21 58 ?49 36.8     A.Elbow ADM 8.4  10.0  93.2 A.Elbow - B.Elbow 10 56 ?49 35.7         A.Elbow - Wrist      L Peroneal - EDB     Ankle EDB 5.3 ?6.5 4.5 ?2.0 100 Ankle - EDB 9   15.7     Fib head EDB 13.2  2.6  57.9 Fib head - Ankle 30 38 ?44 9.3     Pop fossa EDB 15.3  3.7  140 Pop fossa - Fib head 10 49 ?44 14.5         Pop fossa - Ankle      R Peroneal - EDB     Ankle EDB 5.4 ?6.5 5.7 ?2.0 100 Ankle - EDB 9   21.5     Fib head EDB 12.3  4.9  85.1 Fib head - Ankle 30 44 ?44 19.9  Pop fossa EDB 14.6  4.7  96.5 Pop fossa - Fib head 10 44 ?44 19.9         Pop fossa - Ankle      L Tibial - AH     Ankle AH 5.2 ?5.8 1.8 ?4.0 100 Ankle - AH 9   3.5     Pop fossa AH 16.0  1.9  106 Pop fossa - Ankle 40 37 ?41 6.5  R Tibial - AH     Ankle AH 6.4 ?5.8 2.0 ?4.0 100 Ankle - AH 9   4.2     Pop fossa AH 17.2  1.8  91.2 Pop fossa - Ankle 40 37 ?41 3.4                   SNC    Nerve / Sites Rec. Site Peak Lat Ref.  Amp Ref. Segments Distance Peak Diff Ref.    ms ms V V  cm ms ms  L Sural - Ankle (Calf)     Calf Ankle 4.2 ?4.4 10 ?6 Calf - Ankle 14    R Sural - Ankle (Calf)     Calf Ankle 3.4 ?4.4 25 ?6 Calf - Ankle 14    L Superficial peroneal - Ankle     Lat leg Ankle 3.9 ?4.4 7 ?6 Lat leg - Ankle 14    R Superficial peroneal - Ankle     Lat leg Ankle 4.2 ?4.4 8 ?6 Lat leg - Ankle 14    L Median, Ulnar - Transcarpal comparison     Median Palm Wrist 2.0 ?2.2 67 ?35 Median Palm - Wrist 8       Ulnar Palm Wrist 1.8 ?2.2 34 ?12 Ulnar Palm -  Wrist 8          Median Palm - Ulnar Palm  0.2 ?0.4  L Median - Orthodromic (Dig II, Mid palm)     Dig II Wrist 2.9 ?3.4 20 ?10 Dig II - Wrist 13    L Ulnar - Orthodromic, (Dig V, Mid palm)     Dig V Wrist 2.5 ?3.1 17 ?5 Dig V - Wrist 49                     F  Wave    Nerve F Lat Ref.   ms ms  L Tibial - AH 71.9 ?56.0  L Ulnar - ADM 29.5 ?32.0  R Tibial - AH 71.2 ?56.0           EMG Summary Table    Spontaneous MUAP Recruitment  Muscle IA Fib PSW Fasc Other Amp Dur. Poly Pattern  L. Deltoid Normal None None None _______ Normal Normal Normal Normal  L. Triceps brachii Normal None None None _______ Normal Normal Normal Normal  L. Pronator teres Normal None None None _______ Normal Normal Normal Normal  L. First dorsal interosseous Normal None None None _______ Normal Normal Normal Normal  L. Opponens pollicis Normal None None None _______ Normal Normal Normal Normal  L. Vastus medialis Normal None None None _______ Normal Normal Normal Normal  L. Tibialis anterior Normal None None None _______ Normal Normal Normal Normal  L. Gastrocnemius (Medial head) Normal None None None _______ Normal Normal Normal Normal  L. Extensor hallucis longus Normal None None None _______ Normal Normal Normal Reduced  L. Biceps femoris (long head) Normal None None None _______ Normal Normal Normal Normal  L. Gluteus maximus Normal None None None _______ Normal  Normal Normal Normal  L. Gluteus medius Normal None None None _______ Normal Normal Normal Normal  L. Cervical paraspinals (low) Normal None None None _______ Normal Normal Normal Normal  L. Lumbar paraspinals (low) Normal None None None _______ Normal Normal Normal Normal  R. Vastus medialis Normal None None None _______ Normal Normal Normal Normal  R. Tibialis anterior Normal None None None _______ Normal Normal Normal Reduced  R. Gastrocnemius (Medial head) Normal None None None _______ Normal Normal Normal Normal  R. Lumbar paraspinals (low)  Normal None None None _______ Normal Normal Normal Normal  R. Extensor hallucis longus Normal None None None _______ Increased Increased 2+ Reduced  R. Gluteus maximus Normal None None None _______ Normal Normal Normal Normal  R. Biceps femoris (long head) Normal None None None _______ Normal Normal Normal Normal

## 2020-07-16 ENCOUNTER — Telehealth: Payer: Self-pay | Admitting: Neurology

## 2020-07-16 MED FILL — AMITRIPTYLINE HCL 25 MG TAB: 25 | 30 days supply | Qty: 30 | Fill #1

## 2020-07-16 NOTE — Telephone Encounter (Signed)
Spoke with patient. She was planning to go to  [facility] in [place] but was told by them that she should see a neuro-ophthalmologist. D/w [name] Jaynee Eagles and pt that general ophthalmology is fine and neuro-ophthalmology isn't needed. Pt is agreeable to plan to be referred locally in [facility] to ophthalmologist, likely [name] or [name]. Pt verbalized appreciation for the call and her questions were answered.

## 2020-07-16 NOTE — Telephone Encounter (Signed)
Pt called wanting to discuss with RN about getting a referral for an Ophthalmologist . Please advise.

## 2020-07-16 NOTE — Telephone Encounter (Signed)
She told me she already had an appointment with an Ophthalmologist in December? Regardless, I placed referral thanks

## 2020-07-16 NOTE — Addendum Note (Signed)
Addended by: Sarina Ill B on: 07/16/2020 01:31 PM   Modules accepted: Orders

## 2020-07-17 ENCOUNTER — Ambulatory Visit
Admission: RE | Admit: 2020-07-17 | Discharge: 2020-07-17 | Disposition: A | Discharge status: Home / Self Care | Payer: 59 | Source: Ambulatory Visit | Attending: Neurology | Admitting: Neurology

## 2020-07-17 ENCOUNTER — Other Ambulatory Visit: Payer: Self-pay

## 2020-07-17 ENCOUNTER — Inpatient Hospital Stay: Admission: RE | Admit: 2020-07-17 | Payer: 59 | Source: Ambulatory Visit

## 2020-07-17 DIAGNOSIS — G932 Benign intracranial hypertension: Secondary | ICD-10-CM

## 2020-07-17 LAB — CSF CELL COUNT WITH DIFFERENTIAL
RBC Count, CSF: 1 cells/uL — ABNORMAL HIGH
WBC, CSF: 0 cells/uL (ref 0–5)

## 2020-07-17 LAB — GLUCOSE, CSF: Glucose, CSF: 65 mg/dL (ref 40–80)

## 2020-07-17 LAB — PROTEIN, CSF: Total Protein, CSF: 25 mg/dL (ref 15–45)

## 2020-07-17 NOTE — Discharge Instructions (Signed)

## 2020-07-18 ENCOUNTER — Other Ambulatory Visit: Payer: Self-pay | Admitting: Neurology

## 2020-07-18 MED ORDER — ACETAZOLAMIDE 250 MG PO TABS: 250.0000 mg | ORAL_TABLET | Freq: Two times a day (BID) | ORAL | 6 refills | Status: DC

## 2020-07-18 MED FILL — acetaZOLAMIDE 250 MG TABS: 250 | 30 days supply | Qty: 60 | Fill #0

## 2020-07-23 ENCOUNTER — Other Ambulatory Visit (HOSPITAL_COMMUNITY): Payer: Self-pay | Admitting: Family Medicine

## 2020-07-23 ENCOUNTER — Telehealth: Payer: Self-pay | Admitting: Neurology

## 2020-07-23 DIAGNOSIS — Z1231 Encounter for screening mammogram for malignant neoplasm of breast: Secondary | ICD-10-CM

## 2020-07-23 NOTE — Telephone Encounter (Signed)
I have reviewed patient's MyChart messages.  I am not certain that her headaches are related to the lumbar puncture as in post LP headaches.  Please advise patient to stay well-hydrated, I do not believe a blood patch is indicated at this time, her lumbar puncture was about a week ago.  Given her sensitivity to light and straining with her eyes, it is a good idea to get checked out by her eye doctor as soon as possible.  Bethany: Please relay information back to patient.

## 2020-07-23 NOTE — Telephone Encounter (Signed)
Patient notified via my chart message.

## 2020-07-25 NOTE — Telephone Encounter (Signed)
Spoke with pt and scheduled her to see Amy next Monday 10/25 @ 11:30 arrival 15-30 minutes early. Pt aware if she worsens or has decline in vision to proceed to ER over the weekend. Pt verbalized appreciation for the call.

## 2020-07-26 ENCOUNTER — Other Ambulatory Visit: Payer: Self-pay

## 2020-07-26 ENCOUNTER — Ambulatory Visit (HOSPITAL_COMMUNITY)
Admission: RE | Admit: 2020-07-26 | Discharge: 2020-07-26 | Disposition: A | Discharge status: Home / Self Care | Payer: 59 | Source: Ambulatory Visit | Attending: Family Medicine | Admitting: Family Medicine

## 2020-07-26 DIAGNOSIS — Z1231 Encounter for screening mammogram for malignant neoplasm of breast: Secondary | ICD-10-CM | POA: Diagnosis not present

## 2020-07-29 ENCOUNTER — Ambulatory Visit: Payer: 59 | Admitting: Family Medicine

## 2020-07-29 ENCOUNTER — Encounter: Payer: Self-pay | Admitting: Family Medicine

## 2020-07-29 VITALS — BP 125/81 | HR 85 | Ht 71.0 in | Wt 254.0 lb

## 2020-07-29 DIAGNOSIS — R202 Paresthesia of skin: Secondary | ICD-10-CM

## 2020-07-29 DIAGNOSIS — R200 Anesthesia of skin: Secondary | ICD-10-CM

## 2020-07-29 DIAGNOSIS — M545 Low back pain, unspecified: Secondary | ICD-10-CM | POA: Diagnosis not present

## 2020-07-29 DIAGNOSIS — G932 Benign intracranial hypertension: Secondary | ICD-10-CM

## 2020-07-29 DIAGNOSIS — R519 Headache, unspecified: Secondary | ICD-10-CM | POA: Diagnosis not present

## 2020-07-29 NOTE — Patient Instructions (Addendum)
We will continue Ajovy every month, amitriptyline 37m at bedtime, and Diamox 2580mtwice daily. Continue rizatriptan for abortive therapy.   Please have Dr GrCarolynn Sayersend [place] copy of your eye exam notes. Have Dr GoHilma Favorsook at the lump on the back of your right thigh.   Stay well hydrated. Well balanced diet and regular exercise encouraged.   Follow up with [place] 3-6 months   Amitriptyline tablets What is this medicine? AMITRIPTYLINE (a mee TRIP ti leen) is used to treat depression. This medicine may be used for other purposes; ask your health care provider or pharmacist if you have questions. COMMON BRAND NAME(S): Elavil, Vanatrip What should I tell my health care provider before I take this medicine? They need to know if you have any of these conditions:  an alcohol problem  asthma, difficulty breathing  bipolar disorder or schizophrenia  difficulty passing urine, prostate trouble  glaucoma  heart disease or previous heart attack  liver disease  over active thyroid  seizures  thoughts or plans of suicide, a previous suicide attempt, or family history of suicide attempt  an unusual or allergic reaction to amitriptyline, other medicines, foods, dyes, or preservatives  pregnant or trying to get pregnant  breast-feeding How should I use this medicine? Take this medicine by mouth with a drink of water. Follow the directions on the prescription label. You can take the tablets with or without food. Take your medicine at regular intervals. Do not take it more often than directed. Do not stop taking this medicine suddenly except upon the advice of your doctor. Stopping this medicine too quickly may cause serious side effects or your condition may worsen. A special MedGuide will be given to you by the pharmacist with each prescription and refill. Be sure to read this information carefully each time. Talk to your pediatrician regarding the use of this medicine in children.  Special care may be needed. Overdosage: If you think you have taken too much of this medicine contact a poison control center or emergency room at once. NOTE: This medicine is only for you. Do not share this medicine with others. What if I miss a dose? If you miss a dose, take it as soon as you can. If it is almost time for your next dose, take only that dose. Do not take double or extra doses. What may interact with this medicine? Do not take this medicine with any of the following medications:  arsenic trioxide  certain medicines used to regulate abnormal heartbeat or to treat other heart conditions  cisapride  droperidol  halofantrine  linezolid  MAOIs like Carbex, Eldepryl, Marplan, Nardil, and Parnate  methylene blue  other medicines for mental depression  phenothiazines like perphenazine, thioridazine and chlorpromazine  pimozide  probucol  procarbazine  sparfloxacin  St. John's Wort This medicine may also interact with the following medications:  atropine and related drugs like hyoscyamine, scopolamine, tolterodine and others  barbiturate medicines for inducing sleep or treating seizures, like phenobarbital  cimetidine  disulfiram  ethchlorvynol  thyroid hormones such as levothyroxine  ziprasidone This list may not describe all possible interactions. Give your health care provider a list of all the medicines, herbs, non-prescription drugs, or dietary supplements you use. Also tell them if you smoke, drink alcohol, or use illegal drugs. Some items may interact with your medicine. What should I watch for while using this medicine? Tell your doctor if your symptoms do not get better or if they get worse. Visit  your doctor or health care professional for regular checks on your progress. Because it may take several weeks to see the full effects of this medicine, it is important to continue your treatment as prescribed by your doctor. Patients and their  families should watch out for new or worsening thoughts of suicide or depression. Also watch out for sudden changes in feelings such as feeling anxious, agitated, panicky, irritable, hostile, aggressive, impulsive, severely restless, overly excited and hyperactive, or not being able to sleep. If this happens, especially at the beginning of treatment or after a change in dose, call your health care professional. Dennis Bast may get drowsy or dizzy. Do not drive, use machinery, or do anything that needs mental alertness until you know how this medicine affects you. Do not stand or sit up quickly, especially if you are an older patient. This reduces the risk of dizzy or fainting spells. Alcohol may interfere with the effect of this medicine. Avoid alcoholic drinks. Do not treat yourself for coughs, colds, or allergies without asking your doctor or health care professional for advice. Some ingredients can increase possible side effects. Your mouth may get dry. Chewing sugarless gum or sucking hard candy, and drinking plenty of water will help. Contact your doctor if the problem does not go away or is severe. This medicine may cause dry eyes and blurred vision. If you wear contact lenses you may feel some discomfort. Lubricating drops may help. See your eye doctor if the problem does not go away or is severe. This medicine can cause constipation. Try to have a bowel movement at least every 2 to 3 days. If you do not have a bowel movement for 3 days, call your doctor or health care professional. This medicine can make you more sensitive to the sun. Keep out of the sun. If you cannot avoid being in the sun, wear protective clothing and use sunscreen. Do not use sun lamps or tanning beds/booths. What side effects may I notice from receiving this medicine? Side effects that you should report to your doctor or health care professional as soon as possible:  allergic reactions like skin rash, itching or hives, swelling of the  face, lips, or tongue  anxious  breathing problems  changes in vision  confusion  elevated mood, decreased need for sleep, racing thoughts, impulsive behavior  eye pain  fast, irregular heartbeat  feeling faint or lightheaded, falls  feeling agitated, angry, or irritable  fever with increased sweating  hallucination, loss of contact with reality  seizures  stiff muscles  suicidal thoughts or other mood changes  tingling, pain, or numbness in the feet or hands  trouble passing urine or change in the amount of urine  trouble sleeping  unusually weak or tired  vomiting  yellowing of the eyes or skin Side effects that usually do not require medical attention (report to your doctor or health care professional if they continue or are bothersome):  change in sex drive or performance  change in appetite or weight  constipation  dizziness  dry mouth  nausea  tired  tremors  upset stomach This list may not describe all possible side effects. Call your doctor for medical advice about side effects. You may report side effects to FDA at 1-800-FDA-1088. Where should I keep my medicine? Keep out of the reach of children. Store at room temperature between 20 and 25 degrees C (68 and 77 degrees F). Throw away any unused medicine after the expiration date. NOTE: This sheet  is a summary. It may not cover all possible information. If you have questions about this medicine, talk to your doctor, pharmacist, or health care provider.  2020 Elsevier/Gold Standard (2018-09-13 13:04:32)  Acetazolamide Oral Tablets What is this medicine? ACETAZOLAMIDE (a set a ZOLE a mide) is a diuretic. It helps you make more urine and to lose salt and excess water from your body. It treats swelling from heart disease. It helps treat some seizures and some kinds of glaucoma. It also treats and prevents symptoms of altitude sickness (acute mountain sickness). This medicine may be used for  other purposes; ask your health care provider or pharmacist if you have questions. COMMON BRAND NAME(S): Diamox What should I tell my health care provider before I take this medicine? They need to know if you have any of these conditions:  glaucoma  kidney disease  liver disease  low adrenal gland function  lung or breathing disease (COPD, chronic bronchitis, emphysema)  an unusual or allergic reaction to acetazolamide, sulfa drugs, other drugs, foods, dyes or preservatives  pregnant or trying to get pregnant  breast-feeding How should I use this medicine? Take this medicine by mouth with a glass of water. Follow the directions on the prescription label. Take this medicine with food if it upsets your stomach. Take your doses at regular intervals. Do not take your medicine more often than directed. Do not stop taking except on your doctor's advice. Talk to your pediatrician regarding the use of this medicine in children. Special care may be needed. Patients over 59 years old may have a stronger reaction and need a smaller dose. Overdosage: If you think you have taken too much of this medicine contact a poison control center or emergency room at once. NOTE: This medicine is only for you. Do not share this medicine with others. What if I miss a dose? If you miss a dose, take it as soon as you can. If it is almost time for your next dose, take only that dose. Do not take double or extra doses. What may interact with this medicine? Do not take this medicine with any of the following medications:  methazolamide This medicine may also interact with the following medications:  aspirin and aspirin-like medicines  cyclosporine  lithium  medicine for diabetes  methenamine  other diuretics  phenytoin  primidone  quinidine  sodium bicarbonate  stimulant medicines like dextroamphetamine This list may not describe all possible interactions. Give your health care provider a  list of all the medicines, herbs, non-prescription drugs, or dietary supplements you use. Also tell them if you smoke, drink alcohol, or use illegal drugs. Some items may interact with your medicine. What should I watch for while using this medicine? Visit your doctor or health care professional for regular checks on your progress. You will need blood work done regularly. If you are diabetic, check your blood sugar as directed. You may need to be on a special diet while taking this medicine. Ask your doctor. Also, ask how many glasses of fluid you need to drink a day. You must not get dehydrated. You may get drowsy or dizzy. Do not drive, use machinery, or do anything that needs mental alertness until you know how this medicine affects you. Do not stand or sit up quickly, especially if you are an older patient. This reduces the risk of dizzy or fainting spells. This medicine can make you more sensitive to the sun. Keep out of the sun. If you cannot avoid  being in the sun, wear protective clothing and use sunscreen. Do not use sun lamps or tanning beds/booths. What side effects may I notice from receiving this medicine? Side effects that you should report to your doctor or health care professional as soon as possible:  allergic reactions like skin rash, itching or hives, swelling of the face, lips, or tongue  breathing problems  confusion, depression  dark urine  fever  numbness, tingling in hands or feet  redness, blistering, peeling or loosening of the skin, including inside the mouth  ringing in the ears  seizures  unusually weak or tired  yellowing of the eyes or skin Side effects that usually do not require medical attention (report to your doctor or health care professional if they continue or are bothersome):  change in taste  diarrhea  headache  loss of appetite  nausea, vomiting  passing urine more often This list may not describe all possible side effects. Call  your doctor for medical advice about side effects. You may report side effects to FDA at 1-800-FDA-1088. Where should I keep my medicine? Keep out of the reach of children. Store at room temperature between 20 and 25 degrees C (68 and 77 degrees F). Throw away any unused medicine after the expiration date. NOTE: This sheet is a summary. It may not cover all possible information. If you have questions about this medicine, talk to your doctor, pharmacist, or health care provider.  2020 Elsevier/Gold Standard (2019-07-18 12:32:38)   Migraine Headache A migraine headache is a very strong throbbing pain on one side or both sides of your head. This type of headache can also cause other symptoms. It can last from 4 hours to 3 days. Talk with your doctor about what things may bring on (trigger) this condition. What are the causes? The exact cause of this condition is not known. This condition may be triggered or caused by:  Drinking alcohol.  Smoking.  Taking medicines, such as: ? Medicine used to treat chest pain (nitroglycerin). ? Birth control pills. ? Estrogen. ? Some blood pressure medicines.  Eating or drinking certain products.  Doing physical activity. Other things that may trigger a migraine headache include:  Having a menstrual period.  Pregnancy.  Hunger.  Stress.  Not getting enough sleep or getting too much sleep.  Weather changes.  Tiredness (fatigue). What increases the risk?  Being 30-37 years old.  Being female.  Having a family history of migraine headaches.  Being Caucasian.  Having depression or anxiety.  Being very overweight. What are the signs or symptoms?  A throbbing pain. This pain may: ? Happen in any area of the head, such as on one side or both sides. ? Make it hard to do daily activities. ? Get worse with physical activity. ? Get worse around bright lights or loud noises.  Other symptoms may include: ? Feeling sick to your stomach  (nauseous). ? Vomiting. ? Dizziness. ? Being sensitive to bright lights, loud noises, or smells.  Before you get a migraine headache, you may get warning signs (an aura). An aura may include: ? Seeing flashing lights or having blind spots. ? Seeing bright spots, halos, or zigzag lines. ? Having tunnel vision or blurred vision. ? Having numbness or a tingling feeling. ? Having trouble talking. ? Having weak muscles.  Some people have symptoms after a migraine headache (postdromal phase), such as: ? Tiredness. ? Trouble thinking (concentrating). How is this treated?  Taking medicines that: ? Relieve pain. ?  Relieve the feeling of being sick to your stomach. ? Prevent migraine headaches.  Treatment may also include: ? Having acupuncture. ? Avoiding foods that bring on migraine headaches. ? Learning ways to control your body functions (biofeedback). ? Therapy to help you know and deal with negative thoughts (cognitive behavioral therapy). Follow these instructions at home: Medicines  Take over-the-counter and prescription medicines only as told by your doctor.  Ask your doctor if the medicine prescribed to you: ? Requires you to avoid driving or using heavy machinery. ? Can cause trouble pooping (constipation). You may need to take these steps to prevent or treat trouble pooping:  Drink enough fluid to keep your pee (urine) pale yellow.  Take over-the-counter or prescription medicines.  Eat foods that are high in fiber. These include beans, whole grains, and fresh fruits and vegetables.  Limit foods that are high in fat and sugar. These include fried or sweet foods. Lifestyle  Do not drink alcohol.  Do not use any products that contain nicotine or tobacco, such as cigarettes, e-cigarettes, and chewing tobacco. If you need help quitting, ask your doctor.  Get at least 8 hours of sleep every night.  Limit and deal with stress. General instructions      Keep a  journal to find out what may bring on your migraine headaches. For example, write down: ? What you eat and drink. ? How much sleep you get. ? Any change in what you eat or drink. ? Any change in your medicines.  If you have a migraine headache: ? Avoid things that make your symptoms worse, such as bright lights. ? It may help to lie down in a dark, quiet room. ? Do not drive or use heavy machinery. ? Ask your doctor what activities are safe for you.  Keep all follow-up visits as told by your doctor. This is important. Contact a doctor if:  You get a migraine headache that is different or worse than others you have had.  You have more than 15 headache days in one month. Get help right away if:  Your migraine headache gets very bad.  Your migraine headache lasts longer than 72 hours.  You have a fever.  You have a stiff neck.  You have trouble seeing.  Your muscles feel weak or like you cannot control them.  You start to lose your balance a lot.  You start to have trouble walking.  You pass out (faint).  You have a seizure. Summary  A migraine headache is a very strong throbbing pain on one side or both sides of your head. These headaches can also cause other symptoms.  This condition may be treated with medicines and changes to your lifestyle.  Keep a journal to find out what may bring on your migraine headaches.  Contact a doctor if you get a migraine headache that is different or worse than others you have had.  Contact your doctor if you have more than 15 headache days in a month. This information is not intended to replace advice given to you by your health care provider. Make sure you discuss any questions you have with your health care provider. Document Revised: 01/13/2019 Document Reviewed: 11/03/2018 Elsevier Patient Education  [employer].

## 2020-07-29 NOTE — Progress Notes (Addendum)
Chief Complaint  Patient presents with  . Follow-up    rm 9     HISTORY OF PRESENT ILLNESS: Today 07/29/20  [name] is a 54 y.o. female here today for follow up for headaches. She was started on Ajovy and amitriptyline for migraine prevention as well as rizatriptan for abortive therapy in 06/2020. MRI showed concerns for partially empty sella and she was started on Diamox 221m BID. She had a LP on 07/17/2020 and started on Diamox 07/18/2020. She called 07/22/2020 reporting symptoms of increased pressure and pulsating of head with bilateral lower back pain (not at site of LP). Symptoms lasted for about 2-3 hours. She felt that she was having a hard time walking. She states that head pressure comes and goes. She has taken rizatriptan three times and it does abort migraine. She feels that she has brain fog at times. She has noted some tingling of feet. She is sleeping better after starting amitriptyline.   Low back continues with any prolonged standing. She has numbness and tingling of her lower extremities, worse on left. She reports her feet feel like they are going to sleep. NCS/EMG showed possible remote lumbosacral radiculopathy. She is was previously seeing Dr DEstanislado Pandyfor similar symptoms. Referral was placed back to Dr DEstanislado Pandy    HISTORY (copied from Dr ACathren Lainenote on 06/21/2020)  HPI:  [name] KOLASAis a 54y.o. female here as requested by GSharilyn Sites MD for headaches.  Past medical history obesity, headache, vitamin D deficiency.  I reviewed Dr. GDelanna Ahmadinotes which provided no significant history of patient's chief complaint.  She is here today alone, she has been having headaches, started sometime in May, she had a physical at the end of the May and discussed headaches have been going away, also tired no energy, found out she was B12 deficiency and started shots per month.  Headaches are "minute", can also light sensitivity and nausea, she has some GI issues,  Crohn's, has been on steroids.  She also reports nerve pain and stiffness in her hands and feet, she stopped using CPAP due to losing weight and long longer having any problems. She has been having headaches she has pressure in the back of her head at the occipital area and down to her neck, she has tried massage. They start in over the eyes as well. When they are really intense, she is sensitive to light, she has pressure in the back, nausea, pulsating/pulsing/throbbing. She is having the migraines at least 4x a week, they never really go away they just minimize, she will wake up with headaches, she had lost about 40 pounds and she has OSA in the past. She has gained 25 pounds since March, she doesn't sleep, ongoing for about a year after she found her brother dead in [id number] She is fatigued, she has sleep issues. She staets she doesn't sleep all night and go without any sleep for 3 day - she says literally no sleep she turns a lot.  She has vision changes, headache are positional worse supinein the morning, morning headaches. She has muscle cramps, lots of stress. No other focal neurologic deficits, associated symptoms, inciting events or modifiable factors.  Reviewed notes, labs and imaging from outside physicians, which showed:  MRI brain 2017: personally reviewed images and agree with the following:  Brain: No diffusion signal abnormality. Stable partially empty sella turcica. No significant T2 FLAIR signal abnormality. No abnormal enhancement. No abnormal susceptibility hypointensity to indicate intracranial  hemorrhage. No focal mass effect. No extra-axial collection. Normal ventricle size.  MRA H/N 09/2016 reviewed reports/findings:  1. No acute intracranial abnormality. Normal MRI of the brain for age. 2. Patent circle of Willis. No large vessel occlusion, aneurysm, or significant stenosis is identified. 3. Patent carotid and vertebral arteries of the neck. No dissection, aneurysm, or  significant stenosis is identified.  MRI cervical spine 01/08/2012:  personally reviewed images and agree with the following: 1. Tiny left paracentral disc protrusion at C5-6 without significant  canal or neural foraminal stenosis. Finding is stable relative to  previous MRI from 05/28/2007.  2. Mild degenerative disc disease at C4-5 without canal or foraminal  stenosis.  3. Otherwise unremarkable MRI of the cervical spine.   CBC/CMP: normal 04/12/2020  Amlodipine, tylenol, aspirin, flexeril, gabapentin, ibuprofen, toradol injection, magnesium, losartan, meclizine, mobic, reglan, zofran, tramadol, topiramate    REVIEW OF SYSTEMS: Out of a complete 14 system review of symptoms, the patient complains only of the following symptoms, headaches, numbness and tingling of extremities, brain fog, mass of right posterior thigh and all other reviewed systems are negative.   ALLERGIES: Allergies  Allergen Reactions  . Iodinated Diagnostic Agents Anaphylaxis and Hives  . Other Shortness Of Breath    Lemon grass  . Wasp Venom Anaphylaxis and Shortness Of Breath  . Pollen Extract Swelling  . Adhesive [Tape] Rash  . Hydromorphone Hcl Nausea And Vomiting  . Omnipaque [Iohexol] Hives and Nausea Only    Pt. Was premedicated with emergent protocol.     HOME MEDICATIONS: Outpatient Medications Prior to Visit  Medication Sig Dispense Refill  . acetaminophen (TYLENOL) 500 MG tablet Take 2 tablets (1,000 mg total) by mouth every 8 (eight) hours. 30 tablet 0  . acetaZOLAMIDE (DIAMOX) 250 MG tablet Take 1 tablet (250 mg total) by mouth 2 (two) times daily. 60 tablet 6  . albuterol (PROVENTIL) (2.5 MG/3ML) 0.083% nebulizer solution Take 2.5 mg by nebulization every 6 (six) hours as needed. For shortness of breath    . albuterol (VENTOLIN HFA) 108 (90 Base) MCG/ACT inhaler Inhale 1-2 puffs into the lungs every 6 (six) hours as needed for wheezing or shortness of breath.    Marland Kitchen amitriptyline (ELAVIL) 25 MG  tablet Take 1 tablet (25 mg total) by mouth at bedtime. 30 tablet 3  . amLODipine (NORVASC) 5 MG tablet Take 5 mg by mouth at bedtime.    . B Complex Vitamins (VITAMIN B COMPLEX 100 IJ) Inject 1,000 mg as directed every 30 (thirty) days.    . cetirizine (ZYRTEC) 10 MG tablet Take 10 mg by mouth daily.    . cyclobenzaprine (FLEXERIL) 10 MG tablet Take 1 tablet (10 mg total) by mouth 3 (three) times daily as needed for muscle spasms. 40 tablet 0  . diphenhydrAMINE (BENADRYL) 25 MG tablet Take 25 mg by mouth every 6 (six) hours as needed for allergies.    Marland Kitchen EPINEPHrine (EPIPEN) 0.3 mg/0.3 mL SOAJ injection Inject 0.3 mLs (0.3 mg total) into the muscle once. 1 Device 0  . famotidine (PEPCID) 40 MG tablet TAKE 1 TABLET BY MOUTH AT BEDTIME. 30 tablet 1  . Fremanezumab-vfrm (AJOVY) 225 MG/1.5ML SOAJ Inject 225 mg into the skin every 30 (thirty) days. 4.5 mL 3  . gabapentin (NEURONTIN) 300 MG capsule Take 1 capsule (300 mg total) by mouth at bedtime. 90 capsule 5  . ibuprofen (ADVIL) 800 MG tablet TAKE 1 TABLET BY MOUTH EVERY 8 HOURS AS NEEDED 90 tablet 1  . losartan (  COZAAR) 100 MG tablet Take 100 mg by mouth at bedtime.     . Magnesium 500 MG CAPS Take 2,000 mg by mouth at bedtime.     . meclizine (ANTIVERT) 25 MG tablet Take 25 mg by mouth 3 (three) times daily as needed for dizziness.    . montelukast (SINGULAIR) 10 MG tablet Take 10 mg by mouth at bedtime.    . Multiple Vitamin (MULTIVITAMIN) tablet Take 1 tablet by mouth daily.    . ondansetron (ZOFRAN) 4 MG tablet Take 4 mg by mouth every 8 (eight) hours as needed for nausea or vomiting.    . pantoprazole (PROTONIX) 40 MG tablet TAKE (1) TABLET BY MOUTH TWICE DAILY BEFORE A MEAL. (Patient taking differently: Take 40 mg by mouth at bedtime. ) 60 tablet 1  . polyethylene glycol (MIRALAX / GLYCOLAX) 17 g packet Take 17 g by mouth 2 (two) times daily. 28 packet 0  . rizatriptan (MAXALT-MLT) 10 MG disintegrating tablet Take 1 tablet (10 mg total) by  mouth as needed for migraine. May repeat in 2 hours if needed 9 tablet 11  . Vitamin D-Vitamin K (K2 PLUS D3 PO) Take 2 capsules by mouth daily.    . Fremanezumab-vfrm (AJOVY) 225 MG/1.5ML SOAJ Inject 225 mg into the skin every 30 (thirty) days. 1.5 mL 0   No facility-administered medications prior to visit.     PAST MEDICAL HISTORY: Past Medical History:  Diagnosis Date  . Anxiety   . Arthritis   . Asthma   . Cervical hyperplasia    hisotry of  . Chest pain    cath 2005 norm cors, repeat cath Feb 2011 normal cors and only minimally  elevated pulmonary pressures  . Crohn disease ([place])   . Diverticulitis of colon 07/17/2014  . Diverticulosis   . GERD (gastroesophageal reflux disease)   . Hiatal hernia   . History of iron deficiency anemia   . Hypertension   . Migraines   . Obesity   . Palpitations   . Pneumonia 2008   14 days hospitalization, bilateral  . PONV (postoperative nausea and vomiting)   . Sleep apnea sleep study 11/03/2009    cpap; does sometimes, doesnt use every night. weight loss no longer using CPAP  . Vitamin B deficiency      PAST SURGICAL HISTORY: Past Surgical History:  Procedure Laterality Date  . ABDOMINAL HYSTERECTOMY    . ANKLE ARTHROSCOPY  06/01/2012   Procedure: ANKLE ARTHROSCOPY;  Surgeon: Colin Rhein, MD;  Location: [facility];  Service: Orthopedics;  Laterality: Left;  left ankle arthorsocpy with extensive debridement and gastroc slide  . ANKLE SURGERY Left 05/11/2018  . BIOPSY  04/26/2019   Procedure: BIOPSY;  Surgeon: Rogene Houston, MD;  [facility];  Service: Endoscopy;;  ileum colon  . CARDIAC CATHETERIZATION  11/22/2009 and 2005   WNL  . CESAREAN SECTION    . CHOLECYSTECTOMY  09/08/2006   lap. chole.  . CHONDROPLASTY  06/17/2012   Procedure: CHONDROPLASTY;  Surgeon: Carole Civil, MD;  Location: [facility];  Service: Orthopedics;  Laterality: Right;  right patella  . COLONOSCOPY N/A 04/26/2019    Procedure: COLONOSCOPY;  Surgeon: Rogene Houston, MD;  [facility];  Service: Endoscopy;  Laterality: N/A;  100  . ESOPHAGOGASTRODUODENOSCOPY N/A 05/14/2015   Procedure: ESOPHAGOGASTRODUODENOSCOPY (EGD);  Surgeon: Rogene Houston, MD;  [facility];  Service: Endoscopy;  Laterality: N/A;  730  . GIVENS CAPSULE STUDY  N/A 05/24/2019   Procedure: GIVENS CAPSULE STUDY;  Surgeon: Rogene Houston, MD;  [facility];  Service: Endoscopy;  Laterality: N/A;  7:30  . INGUINAL HERNIA REPAIR  10/30/2008   right  . KNEE ARTHROSCOPY Right 2013  . KNEE ARTHROSCOPY WITH LATERAL MENISECTOMY Left 12/23/2016   Procedure: LEFT KNEE ARTHROSCOPY WITH LATERAL MENISECTOMY;  Surgeon: Carole Civil, MD;  Location: [facility];  Service: Orthopedics;  Laterality: Left;  . KNEE ARTHROSCOPY WITH MEDIAL MENISECTOMY Left 12/23/2016   Procedure: LEFT KNEE ARTHROSCOPY WITH MEDIAL MENISECTOMY CHONDROPLASTY PATELLA  AND MEDIAL FEMORAL CONDYLE LEFT KNEE;  Surgeon: Carole Civil, MD;  Location: [facility];  Service: Orthopedics;  Laterality: Left;  . SHOULDER ARTHROSCOPY WITH SUBACROMIAL DECOMPRESSION AND BICEP TENDON REPAIR Left 12/21/2017   Procedure: Left shoulder arthroscopic biceps tenodesis, SAD, DCR and labrum debridement;  Surgeon: Nicholes Stairs, MD;  Location: [facility];  Service: Orthopedics;  Laterality: Left;  120 mins  . SHOULDER SURGERY     right x 2   . TOTAL KNEE ARTHROPLASTY Left 06/06/2019   Procedure: TOTAL KNEE ARTHROPLASTY;  Surgeon: Paralee Cancel, MD;  Location: [facility];  Service: Orthopedics;  Laterality: Left;  70 mins     FAMILY HISTORY: Family History  Problem Relation Age of Onset  . Hypertension Mother   . Atrial fibrillation Mother   . Lupus Father   . COPD Father   . Lupus Sister   . Congestive Heart Failure Maternal Grandmother   . Migraines Neg Hx   . Headache Neg Hx      SOCIAL HISTORY: Social History   Socioeconomic History  . Marital status:  Married    Spouse name: Not on file  . Number of children: 4  . Years of education: Not on file  . Highest education level: Not on file  Occupational History  . Occupation: [employer]  Tobacco Use  . Smoking status: Never Smoker  . Smokeless tobacco: Never Used  Vaping Use  . Vaping Use: Never used  Substance and Sexual Activity  . Alcohol use: No    Alcohol/week: 0.0 standard drinks  . Drug use: Never  . Sexual activity: Yes    Birth control/protection: Surgical  Other Topics Concern  . Not on file  Social History Narrative   Lives with husband and oldest son    Caffeine use: about 5 cups of coffee/day   She is a respiratory therapist at [employer].     She has 4 children age 2-22.     Social Determinants of Health   Financial Resource Strain:   . Difficulty of Paying Living Expenses: Not on file  Food Insecurity:   . Worried About Charity fundraiser in the Last Year: Not on file  . Ran Out of Food in the Last Year: Not on file  Transportation Needs:   . Lack of Transportation (Medical): Not on file  . Lack of Transportation (Non-Medical): Not on file  Physical Activity:   . Days of Exercise per Week: Not on file  . Minutes of Exercise per Session: Not on file  Stress:   . Feeling of Stress : Not on file  Social Connections:   . Frequency of Communication with Friends and Family: Not on file  . Frequency of Social Gatherings with Friends and Family: Not on file  . Attends Religious Services: Not on file  . Active Member of Clubs or Organizations: Not on file  . Attends Archivist  Meetings: Not on file  . Marital Status: Not on file  Intimate Partner Violence:   . Fear of Current or Ex-Partner: Not on file  . Emotionally Abused: Not on file  . Physically Abused: Not on file  . Sexually Abused: Not on file      PHYSICAL EXAM  Vitals:   07/29/20 1129  BP: 125/81  Pulse: 85  Weight: 254 lb (115.2 kg)  Height: 5' 11"  (1.803 m)   Body mass  index is 35.43 kg/m.   Generalized: Well developed, in no acute distress   Neurological examination  Mentation: Alert oriented to time, place, history taking. Follows all commands speech and language fluent Cranial nerve II-XII: Pupils were equal round reactive to light. Extraocular movements were full, visual field were full on confrontational test. Facial sensation and strength were normal. Uvula tongue midline. Head turning and shoulder shrug  were normal and symmetric. Motor: The motor testing reveals 5 over 5 strength of all 4 extremities. Good symmetric motor tone is noted throughout.  Sensory: Sensory testing is intact to soft touch on all 4 extremities. No evidence of extinction is noted.  Gait and station: Gait is normal.  Skin: approx 1" round, soft mass palpated of distal portion of right posterior thigh, no obvious signs of infection or injury, posterior thorax and lower back intact, no obvious areas of edema or injury, lumbar puncture site barely visible and intact with no signs of infection.      DIAGNOSTIC DATA (LABS, IMAGING, TESTING) - I reviewed patient records, labs, notes, testing and imaging myself where available.  Lab Results  Component Value Date   WBC 6.2 04/12/2020   HGB 12.5 04/12/2020   HCT 38.5 04/12/2020   MCV 88.5 04/12/2020   PLT 368 04/12/2020      Component Value Date/Time   NA 139 04/12/2020 1232   NA 144 09/07/2016 0907   K 4.2 04/12/2020 1232   CL 102 04/12/2020 1232   CO2 27 04/12/2020 1232   GLUCOSE 109 (H) 04/12/2020 1232   BUN 12 04/12/2020 1232   BUN 13 09/07/2016 0907   CREATININE 0.84 04/12/2020 1232   CREATININE 0.79 08/01/2019 1530   CALCIUM 9.2 04/12/2020 1232   PROT 7.8 04/12/2020 1232   ALBUMIN 4.1 04/12/2020 1232   AST 15 04/12/2020 1232   ALT 15 04/12/2020 1232   ALKPHOS 53 04/12/2020 1232   BILITOT 1.0 04/12/2020 1232   GFRNONAA >60 04/12/2020 1232   GFRNONAA 87 08/01/2019 1530   GFRAA >60 04/12/2020 1232   GFRAA 100  08/01/2019 1530   Lab Results  Component Value Date   CHOL 165 04/26/2016   HDL 41 04/26/2016   LDLCALC 88 04/26/2016   TRIG 181 (H) 04/26/2016   CHOLHDL 4.0 04/26/2016   Lab Results  Component Value Date   HGBA1C 5.8 (H) 04/26/2016   Lab Results  Component Value Date   VITAMINB12 279 12/30/2009   Lab Results  Component Value Date   TSH 0.880 07/11/2020      ASSESSMENT AND PLAN  54 y.o. year old female  has a past medical history of Anxiety, Arthritis, Asthma, Cervical hyperplasia, Chest pain, Crohn disease ([place]), Diverticulitis of colon (07/17/2014), Diverticulosis, GERD (gastroesophageal reflux disease), Hiatal hernia, History of iron deficiency anemia, Hypertension, Migraines, Obesity, Palpitations, Pneumonia (2008), PONV (postoperative nausea and vomiting), Sleep apnea (sleep study 11/03/2009), and Vitamin B deficiency. here with   IIH (idiopathic intracranial hypertension)  Numbness and tingling of both lower extremities  Lumbar pain  [name] has noted some decreased intensity of headaches and migraines since starting Ajovy, amitriptyline and Diamox.  Rizatriptan has worked well to abort migraines.  We will continue current treatment plan.  We have discussed concerns of intermittent head pressure, intermittent tingling of bilateral lower extremities, brain fog and feeling off balance at times.  Symptoms could be related to medication side effects. I have provided educational materials in AVS for her review.  We have also discussed concerns of lumbar radiculopathy based off nerve conduction study results.  Primary care has referred her back to Dr. Estanislado Pandy for reevaluation.  She is due to have her ophthalmology evaluation today at 3:00.  She will have those notes sent for our review.  She will discuss concerns of right posterior thigh mass with Dr. Hilma Favors.  Fortunately, neuro exam is intact in no obvious signs of infection noted.  Healthy lifestyle habits encouraged.  We will  consider medication adjustments pending ophthalmology exam and tolerability.  Follow-up recommended in 3 to 6 months.  She has an established visit with Dr. Jaynee Eagles that she wishes to keep in January.  She verbalizes understanding and agreement with this plan.   I spent 25 minutes of face-to-face and non-face-to-face time with patient.  This included previsit chart review, lab review, study review, order entry, electronic health record documentation, patient education.    Debbora Presto, MSN, FNP-C 07/29/2020, 1:59 PM  [facility] [address], [place], [place] [address] [phone]   Made any corrections needed, and agree with history, physical, neuro exam,assessment and plan as stated.     Sarina Ill, MD [facility]

## 2020-08-02 DIAGNOSIS — D511 Vitamin B12 deficiency anemia due to selective vitamin B12 malabsorption with proteinuria: Secondary | ICD-10-CM | POA: Diagnosis not present

## 2020-08-05 MED FILL — MONTELUKAST SOD 10 MG TAB: 10 | 90 days supply | Qty: 90 | Fill #2

## 2020-08-08 MED FILL — FAMOTIDINE 40 MG TABS: 40 | 30 days supply | Qty: 30 | Fill #1

## 2020-08-08 MED FILL — AJOVY 225 MG/1.5ML SOAJ: 225 | 30 days supply | Qty: 2 | Fill #1

## 2020-08-08 MED FILL — CYCLOBENZAPRINE HCL 10 MG T: 10 | 10 days supply | Qty: 30 | Fill #0

## 2020-08-16 MED FILL — acetaZOLAMIDE 250 MG TABS: 250 | 30 days supply | Qty: 60 | Fill #1

## 2020-08-19 ENCOUNTER — Other Ambulatory Visit (HOSPITAL_COMMUNITY): Payer: Self-pay | Admitting: Family Medicine

## 2020-08-19 MED FILL — AMLODIPINE BESYLATE 5 MG TA: 5 | 90 days supply | Qty: 90 | Fill #0

## 2020-08-19 MED FILL — PANTOPRAZOLE SOD DR 40 MG T: 40 | 45 days supply | Qty: 90 | Fill #1

## 2020-08-19 MED FILL — AMITRIPTYLINE HCL 25 MG TAB: 25 | 30 days supply | Qty: 30 | Fill #2

## 2020-09-04 DIAGNOSIS — H5213 Myopia, bilateral: Secondary | ICD-10-CM | POA: Diagnosis not present

## 2020-09-06 DIAGNOSIS — D511 Vitamin B12 deficiency anemia due to selective vitamin B12 malabsorption with proteinuria: Secondary | ICD-10-CM | POA: Diagnosis not present

## 2020-09-11 NOTE — Progress Notes (Signed)
Office Visit Note  Patient: [name]             Date of Birth: [birth year]           MRN: [id number]             PCP: Sharilyn Sites, MD Referring: [employer], MD Visit Date: 09/12/2020   Subjective:   History of Present Illness: [name] is a 54 y.o. female with a history of crohn's disease, idiopathic intracranial hypertension, and osteoarthritis here for evaluation of muscle cramps, joint pain, paresthesias, and facial rash. She was seen by Dr. Estanislado Pandy in 2017 but not found to have active systemic inflammatory disease so did not require continued follow up for treatment. Her crohn's disease was controlled on budesonide and had been in remission for years although had to resume treatment earlier this year for return of diarrhea symptoms. She has had multiple worsening problems this year with visual changes found to have IIH with partial empty sella and after treatment with LP and acetazolamide has had partial recovery of vision. She also suffered a fall onto her left side with residual pain although no fracture. She has had left knee replacement and anticipating right knee replacement. Besides her usual joint pain and issues she is noticing episodic cramping and paresthesia of her hands. She does sometimes see color change with pallor. She denies any ulcers or lesions on her hands although right 5th digit remains persistently discolored distally which is chronic. She is having central facial erythema worsening with heat such as hot showers, outdoor hot weather, or prolonged mask wearing. She denies oral ulcers, lymphadenopathy, weight loss, pleurisy, and no history of blood clots.  She has a family history of systemic lupus, previous evaluation showed negative ANA in 2011.  Activities of Daily Living:  Patient reports morning stiffness for 5-30 minutes.   Patient Reports nocturnal pain.  Difficulty dressing/grooming: Denies Difficulty climbing stairs: Reports Difficulty  getting out of chair: Reports Difficulty using hands for taps, buttons, cutlery, and/or writing: Denies  Review of Systems  Constitutional: Positive for fatigue.  HENT: Positive for mouth dryness and nose dryness. Negative for mouth sores.   Eyes: Positive for pain, itching, visual disturbance and dryness.  Respiratory: Positive for shortness of breath. Negative for cough, hemoptysis and difficulty breathing.   Cardiovascular: Negative for chest pain, palpitations and swelling in legs/feet.  Gastrointestinal: Positive for constipation and diarrhea. Negative for abdominal pain and blood in stool.  Endocrine: Negative for increased urination.  Genitourinary: Negative for painful urination.  Musculoskeletal: Positive for arthralgias, joint pain, joint swelling, myalgias, muscle weakness, morning stiffness, muscle tenderness and myalgias.  Skin: Positive for color change, rash and redness.  Allergic/Immunologic: Negative for susceptible to infections.  Neurological: Positive for dizziness, numbness, headaches, memory loss and weakness.  Hematological: Negative for swollen glands.  Psychiatric/Behavioral: Positive for confusion and sleep disturbance.    PMFS History:  Patient Active Problem List   Diagnosis Date Noted  . Discoloration of skin of finger 09/12/2020  . Bilateral hand pain 09/12/2020  . Paresthesia of skin 09/12/2020  . Chronic migraine without aura without status migrainosus, not intractable 06/22/2020  . Iron deficiency anemia 06/20/2019  . S/P left TKA 06/06/2019  . Status post total left knee replacement 06/06/2019  . Special screening for malignant neoplasms, colon 03/28/2019  . LLQ abdominal pain 09/26/2018  . Derangement of anterior horn of lateral meniscus of left knee   . Derangement of posterior horn of medial meniscus  of left knee   . Chest pain 04/25/2016  . Hypokalemia 04/25/2016  . Hyperglycemia 04/25/2016  . Diverticulitis of colon 07/17/2014  . Nausea  without vomiting 07/17/2014  . Crohn disease ([place]) 07/17/2014  . History of arthroscopy of right knee 07/25/2012  . Difficulty in walking(719.7) 06/29/2012  . Weakness of right leg 06/29/2012  . Posterior tibial tendonitis 11/11/2011  . PTTD (posterior tibial tendon dysfunction) 11/11/2011  . Knee pain, right 11/11/2011  . Chest pain 07/16/2011  . Primary osteoarthritis of left knee 02/25/2010  . PAIN IN JOINT, MULTIPLE SITES 02/25/2010  . Essential hypertension 08/13/2009  . HIATAL HERNIA 08/13/2009  . PALPITATIONS 08/13/2009  . DYSPNEA 08/13/2009    Past Medical History:  Diagnosis Date  . Anxiety   . Arthritis   . Asthma   . Cervical hyperplasia    hisotry of  . Chest pain    cath 2005 norm cors, repeat cath Feb 2011 normal cors and only minimally  elevated pulmonary pressures  . Crohn disease ([place])   . Diverticulitis of colon 07/17/2014  . Diverticulosis   . GERD (gastroesophageal reflux disease)   . Hiatal hernia   . History of iron deficiency anemia   . Hypertension   . Idiopathic intracranial hypertension    Per patient  . Migraines   . Obesity   . Palpitations   . Pneumonia 2008   14 days hospitalization, bilateral  . PONV (postoperative nausea and vomiting)   . Sleep apnea sleep study 11/03/2009    cpap; does sometimes, doesnt use every night. weight loss no longer using CPAP  . Vitamin B deficiency     Family History  Problem Relation Age of Onset  . Hypertension Mother   . Atrial fibrillation Mother   . Arthritis Mother   . Lupus Father   . COPD Father   . Rheum arthritis Father   . Sarcoidosis Father   . Lupus Sister   . Congestive Heart Failure Maternal Grandmother   . Migraines Neg Hx   . Headache Neg Hx    Past Surgical History:  Procedure Laterality Date  . ABDOMINAL HYSTERECTOMY    . ANKLE ARTHROSCOPY  06/01/2012   Procedure: ANKLE ARTHROSCOPY;  Surgeon: Colin Rhein, MD;  Location: [facility];  Service: Orthopedics;   Laterality: Left;  left ankle arthorsocpy with extensive debridement and gastroc slide  . ANKLE SURGERY Left 05/11/2018  . BIOPSY  04/26/2019   Procedure: BIOPSY;  Surgeon: Rogene Houston, MD;  [facility];  Service: Endoscopy;;  ileum colon  . CARDIAC CATHETERIZATION  11/22/2009 and 2005   WNL  . CESAREAN SECTION    . CHOLECYSTECTOMY  09/08/2006   lap. chole.  . CHONDROPLASTY  06/17/2012   Procedure: CHONDROPLASTY;  Surgeon: Carole Civil, MD;  Location: [facility];  Service: Orthopedics;  Laterality: Right;  right patella  . COLONOSCOPY N/A 04/26/2019   Procedure: COLONOSCOPY;  Surgeon: Rogene Houston, MD;  [facility];  Service: Endoscopy;  Laterality: N/A;  100  . ESOPHAGOGASTRODUODENOSCOPY N/A 05/14/2015   Procedure: ESOPHAGOGASTRODUODENOSCOPY (EGD);  Surgeon: Rogene Houston, MD;  [facility];  Service: Endoscopy;  Laterality: N/A;  730  . GIVENS CAPSULE STUDY N/A 05/24/2019   Procedure: GIVENS CAPSULE STUDY;  Surgeon: Rogene Houston, MD;  [facility];  Service: Endoscopy;  Laterality: N/A;  7:30  . INGUINAL HERNIA REPAIR  10/30/2008   right  . KNEE ARTHROSCOPY Right 2013  .  KNEE ARTHROSCOPY WITH LATERAL MENISECTOMY Left 12/23/2016   Procedure: LEFT KNEE ARTHROSCOPY WITH LATERAL MENISECTOMY;  Surgeon: Carole Civil, MD;  Location: [facility];  Service: Orthopedics;  Laterality: Left;  . KNEE ARTHROSCOPY WITH MEDIAL MENISECTOMY Left 12/23/2016   Procedure: LEFT KNEE ARTHROSCOPY WITH MEDIAL MENISECTOMY CHONDROPLASTY PATELLA  AND MEDIAL FEMORAL CONDYLE LEFT KNEE;  Surgeon: Carole Civil, MD;  Location: [facility];  Service: Orthopedics;  Laterality: Left;  . SHOULDER ARTHROSCOPY WITH SUBACROMIAL DECOMPRESSION AND BICEP TENDON REPAIR Left 12/21/2017   Procedure: Left shoulder arthroscopic biceps tenodesis, SAD, DCR and labrum debridement;  Surgeon: Nicholes Stairs, MD;  Location: [facility];  Service: Orthopedics;  Laterality: Left;  120 mins   . SHOULDER SURGERY     right x 2   . TOTAL KNEE ARTHROPLASTY Left 06/06/2019   Procedure: TOTAL KNEE ARTHROPLASTY;  Surgeon: Paralee Cancel, MD;  Location: [facility];  Service: Orthopedics;  Laterality: Left;  70 mins   Social History   Social History Narrative   Lives with husband and oldest son    Caffeine use: about 5 cups of coffee/day   She is a respiratory therapist at [employer].     She has 4 children age 84-22.     Immunization History  Administered Date(s) Administered  . Moderna SARS-COVID-2 Vaccination 11/28/2019, 12/28/2019     Objective: Vital Signs: BP 117/78 (BP Location: Left Arm, Patient Position: Sitting, Cuff Size: Small)   Pulse 84   Ht 5' 10.5" (1.791 m)   Wt 253 lb 6.4 oz (114.9 kg)   BMI 35.85 kg/m    Physical Exam Constitutional:      Appearance: She is obese.  HENT:     Right Ear: External ear normal.     Left Ear: External ear normal.     Mouth/Throat:     Mouth: Mucous membranes are moist.     Pharynx: Oropharynx is clear.  Eyes:     Conjunctiva/sclera: Conjunctivae normal.  Cardiovascular:     Rate and Rhythm: Normal rate and regular rhythm.  Pulmonary:     Effort: Pulmonary effort is normal.     Breath sounds: Normal breath sounds.  Skin:    General: Skin is warm and dry.     Comments: Central facial erythema perioral and over nasolabial folds Nailfold capillary tortuosity No digital pitting or lesions  Neurological:     General: No focal deficit present.     Mental Status: She is alert.  Psychiatric:        Mood and Affect: Mood normal.      Musculoskeletal Exam:  Neck full range of motion no tenderness Shoulder, elbow, wrist, fingers full range of motion no tenderness or swelling No paraspinal tenderness to palpation over upper and lower back Normal hip internal and external rotation without pain, no tenderness to lateral hip palpation Right knee patellofemoral crepitus, left knee well healed surgical scar Left ankle  well healed surgical scar   Investigation: No additional findings.  Imaging: XR Hand 2 View Left  Result Date: 09/12/2020 Xray left hand 2 views Radiocarpal and carpal joint spaces appear normal. MCP, PIP, and DIP joint spaces are normal, possibly very early 2nd and 3rd digit osteophytes. Bone mineralization appears normal. No soft tissue swelling seen. Impression Very early OA changes no inflammatory abnormalities seen  XR Hand 2 View Right  Result Date: 09/12/2020 Xray right hand 2 views Radiocarpal and carpal joint spaces appear normal. 1st CMP joint space narrowing and partial subluxation,  with calcifications on radial aspect. MCP joints intact, 3rd PIP osteophyte otherwise PIP and DIP joint spaces normal. Bone mineralization appears normal. No soft tissue swelling seen. Impression Very early OA changes except worse in 1st CMP joint, no erosion or demineralization   Recent Labs: Lab Results  Component Value Date   WBC 6.2 04/12/2020   HGB 12.5 04/12/2020   PLT 368 04/12/2020   NA 139 04/12/2020   K 4.2 04/12/2020   CL 102 04/12/2020   CO2 27 04/12/2020   GLUCOSE 109 (H) 04/12/2020   BUN 12 04/12/2020   CREATININE 0.84 04/12/2020   BILITOT 1.0 04/12/2020   ALKPHOS 53 04/12/2020   AST 15 04/12/2020   ALT 15 04/12/2020   PROT 7.8 04/12/2020   ALBUMIN 4.1 04/12/2020   CALCIUM 9.2 04/12/2020   GFRAA >60 04/12/2020    Speciality Comments: No specialty comments available.  Procedures:  No procedures performed Allergies: Iodinated diagnostic agents, Other, Wasp venom, Pollen extract, Adhesive [tape], Hydromorphone hcl, and Omnipaque [iohexol]   Assessment / Plan:     Visit Diagnoses: Bilateral hand pain - Plan: XR Hand 2 View Left, XR Hand 2 View Right, ANA, Pan-ANCA, C3 and C4, Beta-2 glycoprotein antibodies, Cardiolipin antibodies, IgG, IgM, IgA, Lupus Anticoagulant Eval w/Reflex, Sedimentation rate Crohn's disease of small intestine without complication  (HCC)  Bilateral hand pain along with skin rashes but no obvious inflammation on exam today. Enteropathy related arthritis can present with peripheral disease versus separate process. Xrays show mild degenerative changes, no erosions or demineralization. Will workup with ANA, complement, ESR. Currently not demonstrating clear clinical criteria for SLE.  Discoloration of skin of finger Paresthesia of skin  Finger discoloration and paresthesias with abnormal nailfold capillaries suspicious for secondary raynaud's versus neurologic process. Will check for APS Abs and ANCA Abs in addition to lupus screening.  Orders: Orders Placed This Encounter  Procedures  . XR Hand 2 View Left  . XR Hand 2 View Right  . ANA  . Pan-ANCA  . C3 and C4  . Beta-2 glycoprotein antibodies  . Cardiolipin antibodies, IgG, IgM, IgA  . Lupus Anticoagulant Eval w/Reflex  . Sedimentation rate   No orders of the defined types were placed in this encounter.    Follow-Up Instructions: Return in about 2 weeks (around 09/26/2020).   Collier Salina, MD  Note - This record has been created using [employer].  Chart creation errors have been sought, but may not always  have been located. Such creation errors do not reflect on  the standard of medical care.

## 2020-09-12 ENCOUNTER — Ambulatory Visit: Payer: Self-pay

## 2020-09-12 ENCOUNTER — Other Ambulatory Visit: Payer: Self-pay

## 2020-09-12 ENCOUNTER — Encounter: Payer: Self-pay | Admitting: Internal Medicine

## 2020-09-12 ENCOUNTER — Ambulatory Visit: Payer: 59 | Admitting: Internal Medicine

## 2020-09-12 VITALS — BP 117/78 | HR 84 | Ht 70.5 in | Wt 253.4 lb

## 2020-09-12 DIAGNOSIS — M79641 Pain in right hand: Secondary | ICD-10-CM

## 2020-09-12 DIAGNOSIS — R202 Paresthesia of skin: Secondary | ICD-10-CM | POA: Diagnosis not present

## 2020-09-12 DIAGNOSIS — M79642 Pain in left hand: Secondary | ICD-10-CM

## 2020-09-12 DIAGNOSIS — L819 Disorder of pigmentation, unspecified: Secondary | ICD-10-CM | POA: Diagnosis not present

## 2020-09-12 DIAGNOSIS — K5000 Crohn's disease of small intestine without complications: Secondary | ICD-10-CM

## 2020-09-16 LAB — SEDIMENTATION RATE: Sed Rate: 9 mm/h (ref 0–30)

## 2020-09-16 LAB — PAN-ANCA
ANCA Screen: NEGATIVE
Myeloperoxidase Abs: <1 AI
Serine Protease 3: <1 AI

## 2020-09-16 LAB — CARDIOLIPIN ANTIBODIES, IGG, IGM, IGA
Anticardiolipin IgA: <2 APL-U/mL
Anticardiolipin IgG: <2 GPL-U/mL
Anticardiolipin IgM: <2 MPL-U/mL

## 2020-09-16 LAB — ANA: Anti Nuclear Antibody (ANA): NEGATIVE

## 2020-09-16 LAB — C3 AND C4
C3 Complement: 168 mg/dL (ref 83–193)
C4 Complement: 69 mg/dL — ABNORMAL HIGH (ref 15–57)

## 2020-09-16 LAB — BETA-2 GLYCOPROTEIN ANTIBODIES
Beta-2 Glyco 1 IgA: <2 U/mL
Beta-2 Glyco 1 IgM: <2 U/mL
Beta-2 Glyco I IgG: <2 U/mL

## 2020-09-16 LAB — LUPUS ANTICOAGULANT EVAL W/ REFLEX
PTT-LA Screen: 38 s (ref <=40)
dRVVT: 41 s (ref <=45)

## 2020-09-19 ENCOUNTER — Other Ambulatory Visit (INDEPENDENT_AMBULATORY_CARE_PROVIDER_SITE_OTHER): Payer: Self-pay | Admitting: Internal Medicine

## 2020-09-19 ENCOUNTER — Other Ambulatory Visit (INDEPENDENT_AMBULATORY_CARE_PROVIDER_SITE_OTHER): Payer: Self-pay | Admitting: Gastroenterology

## 2020-09-19 MED FILL — AJOVY 225 MG/1.5ML SOAJ: 225 | 30 days supply | Qty: 2 | Fill #2

## 2020-09-19 MED FILL — AMITRIPTYLINE HCL 25 MG TAB: 25 | 30 days supply | Qty: 30 | Fill #3

## 2020-09-19 MED FILL — CYCLOBENZAPRINE HCL 10 MG T: 10 | 10 days supply | Qty: 30 | Fill #1

## 2020-09-19 MED FILL — acetaZOLAMIDE 250 MG TABS: 250 | 30 days supply | Qty: 60 | Fill #2

## 2020-09-19 MED FILL — FAMOTIDINE 40 MG TABS: 40 | 30 days supply | Qty: 30 | Fill #0

## 2020-09-26 ENCOUNTER — Ambulatory Visit: Payer: 59 | Attending: Internal Medicine

## 2020-09-26 DIAGNOSIS — Z23 Encounter for immunization: Secondary | ICD-10-CM

## 2020-09-26 NOTE — Progress Notes (Signed)
   Covid-19 Vaccination Clinic  Name:  PEREL HAUSCHILD    MRN: [id number] DOB: 1965-11-06  09/26/2020  Ms. Choung was observed post Covid-19 immunization for 30 minutes based on pre-vaccination screening without incident. She was provided with Vaccine Information Sheet and instruction to access the V-Safe system.   Ms. Puccinelli was instructed to call 911 with any severe reactions post vaccine: Marland Kitchen Difficulty breathing  . Swelling of face and throat  . A fast heartbeat  . A bad rash all over body  . Dizziness and weakness   Immunizations Administered    Name Date Dose VIS Date Route   Moderna Covid-19 Booster Vaccine 09/26/2020  2:37 PM 0.25 mL 07/24/2020 Intramuscular   Manufacturer: Moderna   Lot: [id number]   [place]: [id number]

## 2020-10-02 DIAGNOSIS — D511 Vitamin B12 deficiency anemia due to selective vitamin B12 malabsorption with proteinuria: Secondary | ICD-10-CM | POA: Diagnosis not present

## 2020-10-13 NOTE — Progress Notes (Unsigned)
[facility]    Provider:  Dr Jaynee Eagles Requesting Provider: Sharilyn Sites, MD Primary Care Provider:  Sharilyn Sites, MD  CC: I IH, migraines, and several other neurologic symptoms  Interval history October 13, 2020(last seen by myself 9/17 for appointment and 10/72021 for emg/ncs and also seen by NP Amy Lomax 07/29/2020: Patient is here for a follow-up of migraines and other multiple neurologic symptoms.  At last appointment she was started on Ajovy and amitriptyline for her migraines, Diamox after LP with elevated opening pressure, rizatriptan for acute migraine management, and an EMG nerve conduction study was performed which did not show anything significant to explain all her other multiple neurologic symptoms(See below) in the setting of mood disorder which may be contributory.  Per Amy Lomax the rizatriptan was helping, amitriptyline was helping her sleep.  She also endorsed some decreased intensity of headaches and migraines to starting Ajovy, amitriptyline and Diamox.  Amy Lomax continued current treatment.  Since last being seen she was seen by ophthalmology.  Neuro exam is nonfocal.  Her headaches and migraines are feeling better. She still has the pressure in the head but improved. Increase Diamox, keep her on the Ajovy.   Conclusion 07/11/2020: Sensory nerve conductions were within normal limits.  Reduced amplitude of the bilateral tibial motor nerves could be due to remote lumbosacral radiculopathy given normal sensory conductions; EMG muscle study did not show any acute/ongoing denervation to suggest acute radiculopathy.  Mild slowing across the left fibular head may be due to prior surgery versus a mild peroneal neuropathy.  None of these findings would account for patient's diffuse symptoms.  HPI:  [name] is a 55 y.o. female here as requested by Sharilyn Sites, MD for headaches.  Past medical history obesity, headache, vitamin D deficiency.  I reviewed Dr.  Delanna Ahmadi notes which provided no significant history of patient's chief complaint.  She is here today alone, she has been having headaches, started sometime in May, she had a physical at the end of the May and discussed headaches have been going away, also tired no energy, found out she was B12 deficiency and started shots per month.  Headaches are "minute", can also light sensitivity and nausea, she has some GI issues, Crohn's, has been on steroids.  She also reports nerve pain and stiffness in her hands and feet, she stopped using CPAP due to losing weight and long longer having any problems. She has been having headaches she has pressure in the back of her head at the occipital area and down to her neck, she has tried massage. They start in over the eyes as well. When they are really intense, she is sensitive to light, she has pressure in the back, nausea, pulsating/pulsing/throbbing. She is having the migraines at least 4x a week, they never really go away they just minimize, she will wake up with headaches, she had lost about 40 pounds and she has OSA in the past. She has gained 25 pounds since March, she doesn't sleep, ongoing for about a year after she found her brother dead in 09-29-23. She is fatigued, she has sleep issues. She staets she doesn't sleep all night and go without any sleep for 3 day - she says literally no sleep she turns a lot.  She has vision changes, headache are positional worse supinein the morning, morning headaches. She has muscle cramps, lots of stress. No other focal neurologic deficits, associated symptoms, inciting events or modifiable factors.  Reviewed notes, labs and  imaging from outside physicians, which showed:  MRI brain 2017: personally reviewed images and agree with the following:  Brain: No diffusion signal abnormality. Stable partially empty sella turcica. No significant T2 FLAIR signal abnormality. No abnormal enhancement. No abnormal susceptibility hypointensity  to indicate intracranial hemorrhage. No focal mass effect. No extra-axial collection. Normal ventricle size.  MRA H/N 09/2016 reviewed reports/findings:  1. No acute intracranial abnormality. Normal MRI of the brain for age. 2. Patent circle of Willis. No large vessel occlusion, aneurysm, or significant stenosis is identified. 3. Patent carotid and vertebral arteries of the neck. No dissection, aneurysm, or significant stenosis is identified.  MRI cervical spine 01/08/2012:  personally reviewed images and agree with the following: 1. Tiny left paracentral disc protrusion at C5-6 without significant  canal or neural foraminal stenosis. Finding is stable relative to  previous MRI from 05/28/2007.  2. Mild degenerative disc disease at C4-5 without canal or foraminal  stenosis.  3. Otherwise unremarkable MRI of the cervical spine.   CBC/CMP: normal 04/12/2020  Amlodipine, tylenol, aspirin, flexeril, gabapentin, ibuprofen, toradol injection, magnesium, losartan, meclizine, mobic, reglan, zofran, tramadol, topiramate  Review of Systems: Patient complains of symptoms per HPI as well as the following symptoms:headache, fatigue . Pertinent negatives and positives per HPI. All others negative    Social History   Socioeconomic History  . Marital status: Married    Spouse name: Not on file  . Number of children: 4  . Years of education: Not on file  . Highest education level: Not on file  Occupational History    Employer: [employer]    Comment: corporate safety  Tobacco Use  . Smoking status: Never Smoker  . Smokeless tobacco: Never Used  Vaping Use  . Vaping Use: Never used  Substance and Sexual Activity  . Alcohol use: No    Alcohol/week: 0.0 standard drinks  . Drug use: Never  . Sexual activity: Yes    Birth control/protection: Surgical  Other Topics Concern  . Not on file  Social History Narrative   Lives with husband and oldest son    Caffeine use: about 3 cups of  coffee/day   She works in Chief Operating Officer with [employer]   She has 4 children age 18-22.     Social Determinants of Health   Financial Resource Strain: Not on file  Food Insecurity: Not on file  Transportation Needs: Not on file  Physical Activity: Not on file  Stress: Not on file  Social Connections: Not on file  Intimate Partner Violence: Not on file    Family History  Problem Relation Age of Onset  . Hypertension Mother   . Atrial fibrillation Mother   . Arthritis Mother   . Lupus Father   . COPD Father   . Rheum arthritis Father   . Sarcoidosis Father   . Lupus Sister   . Congestive Heart Failure Maternal Grandmother   . Migraines Neg Hx   . Headache Neg Hx     Past Medical History:  Diagnosis Date  . Anxiety   . Arthritis   . Asthma   . Cervical hyperplasia    hisotry of  . Chest pain    cath 2005 norm cors, repeat cath Feb 2011 normal cors and only minimally  elevated pulmonary pressures  . Crohn disease ([place])   . Diverticulitis of colon 07/17/2014  . Diverticulosis   . GERD (gastroesophageal reflux disease)   . Hiatal hernia   . History of iron deficiency anemia   .  Hypertension   . Idiopathic intracranial hypertension    Per patient  . Migraines   . Obesity   . Palpitations   . Pneumonia 2008   14 days hospitalization, bilateral  . PONV (postoperative nausea and vomiting)   . Sleep apnea sleep study 11/03/2009    cpap; does sometimes, doesnt use every night. weight loss no longer using CPAP  . Vitamin B deficiency     Patient Active Problem List   Diagnosis Date Noted  . IIH (idiopathic intracranial hypertension) 10/14/2020  . Discoloration of skin of finger 09/12/2020  . Bilateral hand pain 09/12/2020  . Paresthesia of skin 09/12/2020  . Chronic migraine without aura without status migrainosus, not intractable 06/22/2020  . Iron deficiency anemia 06/20/2019  . S/P left TKA 06/06/2019  . Status post total left knee replacement 06/06/2019   . Special screening for malignant neoplasms, colon 03/28/2019  . LLQ abdominal pain 09/26/2018  . Derangement of anterior horn of lateral meniscus of left knee   . Derangement of posterior horn of medial meniscus of left knee   . Chest pain 04/25/2016  . Hypokalemia 04/25/2016  . Hyperglycemia 04/25/2016  . Diverticulitis of colon 07/17/2014  . Nausea without vomiting 07/17/2014  . Crohn disease ([place]) 07/17/2014  . History of arthroscopy of right knee 07/25/2012  . Difficulty in walking(719.7) 06/29/2012  . Weakness of right leg 06/29/2012  . Posterior tibial tendonitis 11/11/2011  . PTTD (posterior tibial tendon dysfunction) 11/11/2011  . Knee pain, right 11/11/2011  . Chest pain 07/16/2011  . Primary osteoarthritis of left knee 02/25/2010  . PAIN IN JOINT, MULTIPLE SITES 02/25/2010  . Essential hypertension 08/13/2009  . HIATAL HERNIA 08/13/2009  . PALPITATIONS 08/13/2009  . DYSPNEA 08/13/2009    Past Surgical History:  Procedure Laterality Date  . ABDOMINAL HYSTERECTOMY    . ANKLE ARTHROSCOPY  06/01/2012   Procedure: ANKLE ARTHROSCOPY;  Surgeon: Colin Rhein, MD;  Location: [place];  Service: Orthopedics;  Laterality: Left;  left ankle arthorsocpy with extensive debridement and gastroc slide  . ANKLE SURGERY Left 05/11/2018  . BIOPSY  04/26/2019   Procedure: BIOPSY;  Surgeon: Rogene Houston, MD;  [facility];  Service: Endoscopy;;  ileum colon  . CARDIAC CATHETERIZATION  11/22/2009 and 2005   WNL  . CESAREAN SECTION    . CHOLECYSTECTOMY  09/08/2006   lap. chole.  . CHONDROPLASTY  06/17/2012   Procedure: CHONDROPLASTY;  Surgeon: Carole Civil, MD;  [facility];  Service: Orthopedics;  Laterality: Right;  right patella  . COLONOSCOPY N/A 04/26/2019   Procedure: COLONOSCOPY;  Surgeon: Rogene Houston, MD;  [facility];  Service: Endoscopy;  Laterality: N/A;  100  . ESOPHAGOGASTRODUODENOSCOPY N/A 05/14/2015   Procedure:  ESOPHAGOGASTRODUODENOSCOPY (EGD);  Surgeon: Rogene Houston, MD;  [facility];  Service: Endoscopy;  Laterality: N/A;  730  . GIVENS CAPSULE STUDY N/A 05/24/2019   Procedure: GIVENS CAPSULE STUDY;  Surgeon: Rogene Houston, MD;  [facility];  Service: Endoscopy;  Laterality: N/A;  7:30  . INGUINAL HERNIA REPAIR  10/30/2008   right  . KNEE ARTHROSCOPY Right 2013  . KNEE ARTHROSCOPY WITH LATERAL MENISECTOMY Left 12/23/2016   Procedure: LEFT KNEE ARTHROSCOPY WITH LATERAL MENISECTOMY;  Surgeon: Carole Civil, MD;  Location: [facility];  Service: Orthopedics;  Laterality: Left;  . KNEE ARTHROSCOPY WITH MEDIAL MENISECTOMY Left 12/23/2016   Procedure: LEFT KNEE ARTHROSCOPY WITH MEDIAL MENISECTOMY CHONDROPLASTY PATELLA  AND  MEDIAL FEMORAL CONDYLE LEFT KNEE;  Surgeon: Carole Civil, MD;  Location: [facility];  Service: Orthopedics;  Laterality: Left;  . SHOULDER ARTHROSCOPY WITH SUBACROMIAL DECOMPRESSION AND BICEP TENDON REPAIR Left 12/21/2017   Procedure: Left shoulder arthroscopic biceps tenodesis, SAD, DCR and labrum debridement;  Surgeon: Nicholes Stairs, MD;  Location: [place];  Service: Orthopedics;  Laterality: Left;  120 mins  . SHOULDER SURGERY     right x 2   . TOTAL KNEE ARTHROPLASTY Left 06/06/2019   Procedure: TOTAL KNEE ARTHROPLASTY;  Surgeon: Paralee Cancel, MD;  Location: [facility];  Service: Orthopedics;  Laterality: Left;  70 mins    Current Outpatient Medications  Medication Sig Dispense Refill  . acetaminophen (TYLENOL) 500 MG tablet Take 2 tablets (1,000 mg total) by mouth every 8 (eight) hours. (Patient taking differently: Take 1,000 mg by mouth as needed.) 30 tablet 0  . acetaZOLAMIDE (DIAMOX) 500 MG capsule Take 1 capsule (500 mg total) by mouth 2 (two) times daily. 180 capsule 3  . albuterol (PROVENTIL) (2.5 MG/3ML) 0.083% nebulizer solution Take 2.5 mg by nebulization every 6 (six) hours as needed. For shortness of breath    . albuterol (VENTOLIN HFA) 108  (90 Base) MCG/ACT inhaler Inhale 1-2 puffs into the lungs every 6 (six) hours as needed for wheezing or shortness of breath.    Marland Kitchen amitriptyline (ELAVIL) 25 MG tablet Take 1 tablet (25 mg total) by mouth at bedtime. 30 tablet 3  . amLODipine (NORVASC) 5 MG tablet Take 5 mg by mouth at bedtime.    . B Complex Vitamins (VITAMIN B COMPLEX 100 IJ) Inject 1,000 mg as directed every 30 (thirty) days.    . cetirizine (ZYRTEC) 10 MG tablet Take 10 mg by mouth daily.    . cyclobenzaprine (FLEXERIL) 10 MG tablet Take 1 tablet (10 mg total) by mouth 3 (three) times daily as needed for muscle spasms. (Patient taking differently: Take 10 mg by mouth at bedtime.) 40 tablet 0  . diphenhydrAMINE (BENADRYL) 25 MG tablet Take 25 mg by mouth every 6 (six) hours as needed for allergies.    Marland Kitchen EPINEPHrine (EPIPEN) 0.3 mg/0.3 mL SOAJ injection Inject 0.3 mLs (0.3 mg total) into the muscle once. 1 Device 0  . famotidine (PEPCID) 40 MG tablet TAKE 1 TABLET BY MOUTH AT BEDTIME. 30 tablet 1  . Fremanezumab-vfrm (AJOVY) 225 MG/1.5ML SOAJ Inject 225 mg into the skin every 30 (thirty) days. 4.5 mL 3  . Fremanezumab-vfrm (AJOVY) 225 MG/1.5ML SOAJ Inject 225 mg into the skin every 30 (thirty) days. 3 mL 0  . gabapentin (NEURONTIN) 300 MG capsule Take 1 capsule (300 mg total) by mouth at bedtime. 90 capsule 5  . ibuprofen (ADVIL) 800 MG tablet TAKE 1 TABLET BY MOUTH EVERY 8 HOURS AS NEEDED 90 tablet 1  . losartan (COZAAR) 100 MG tablet Take 100 mg by mouth at bedtime.     . Magnesium 500 MG CAPS Take 2,000 mg by mouth at bedtime.     . meclizine (ANTIVERT) 25 MG tablet Take 25 mg by mouth 3 (three) times daily as needed for dizziness.    . montelukast (SINGULAIR) 10 MG tablet Take 10 mg by mouth at bedtime.    . Multiple Vitamin (MULTIVITAMIN) tablet Take 1 tablet by mouth daily.    . ondansetron (ZOFRAN) 4 MG tablet Take 4 mg by mouth every 8 (eight) hours as needed for nausea or vomiting.    . pantoprazole (PROTONIX) 40 MG  tablet TAKE (1)  TABLET BY MOUTH TWICE DAILY BEFORE A MEAL. (Patient taking differently: Take 40 mg by mouth at bedtime.) 60 tablet 1  . polyethylene glycol (MIRALAX / GLYCOLAX) 17 g packet Take 17 g by mouth 2 (two) times daily. 28 packet 0  . rizatriptan (MAXALT-MLT) 10 MG disintegrating tablet Take 1 tablet (10 mg total) by mouth as needed for migraine. May repeat in 2 hours if needed 9 tablet 11  . Vitamin D-Vitamin K (K2 PLUS D3 PO) Take 2 capsules by mouth daily.     No current facility-administered medications for this visit.    Allergies as of 10/14/2020 - Review Complete 10/14/2020  Allergen Reaction Noted  . Iodinated diagnostic agents Anaphylaxis and Hives   . Other Shortness Of Breath 05/28/2012  . Wasp venom Anaphylaxis and Shortness Of Breath 08/06/2011  . Pollen extract Swelling 05/28/2012  . Adhesive [tape] Rash 06/03/2012  . Hydromorphone hcl Nausea And Vomiting 08/13/2009  . Omnipaque [iohexol] Hives and Nausea Only 01/02/2011    Vitals: BP 114/79 (BP Location: Right Arm, Patient Position: Sitting, Cuff Size: Large)   Pulse 79   Ht 5' 11.5" (1.816 m)   Wt 255 lb (115.7 kg)   BMI 35.07 kg/m  Last Weight:  Wt Readings from Last 1 Encounters:  10/14/20 255 lb (115.7 kg)   Last Height:   Ht Readings from Last 1 Encounters:  10/14/20 5' 11.5" (1.816 m)    Exam: NAD, pleasant                  Speech:    Speech is normal; fluent and spontaneous with normal comprehension.  Cognition:    The patient is oriented to person, place, and time;     recent and remote memory intact;     language fluent;    Cranial Nerves:    The pupils are equal, round, and reactive to light.Fundi flat. Trigeminal sensation is intact and the muscles of mastication are normal. The face is symmetric. The palate elevates in the midline. Hearing intact. Voice is normal. Shoulder shrug is normal. The tongue has normal motion without fasciculations.   Coordination:  No dysmetria  Motor  Observation:    No asymmetry, no atrophy, and no involuntary movements noted. Tone:    Normal muscle tone.     Strength:    Strength is V/V in the upper and lower limbs.      Sensation: intact to LT       Assessment/Plan:   [name] is a 55 y.o. female here as requested by Sharilyn Sites, MD for headaches.  Past medical history obesity, headache, vitamin D deficiency.  Patient has chronic migraines but she also has multiple other symptoms including  "nerve pain and stiffness" in her extremities, occipital headaches that can be positional, history of obstructive sleep apnea, worsening headaches, vision changes, muscle cramps, likely stress or mood disorders also affecting her.  - Sleep evaluation, had OSA in the past, morning headaches, fatigue. She has not gone yey, encouraged her to do so and lose weight - Continue Amitriptyline at bedtime: migraine prevention and sleep - Continue Ajovy: migraine prevention: Start injections Ajovy: she is already on multiple oral medications, will start monthly injection - Acute migraines: Continue Rizatriptan. Please take one tablet at the onset of your headache. If it does not improve the symptoms please take one additional tablet. Do not take more then 2 tablets in 24hrs. Do not take use more then 2 to 3 times in a week. -  MRI brain: Opening pressure LP was 23. MRIL Enlarged partially empty sella and slightly enlarged optic nerve sheaths. Findings are non-specific but can be seen in association with idiopathic intracranial hypertension (pseudotumor cerebri).  - emg/ncs one arm one leg: See procedure note - Saw ophthalmology, they released her to optometry - Patient likely has some significant stress and mood disorders, she really needs to follow-up with Dr. Hilma Favors or see psychiatry; discussed psychiatry and therapy with Dr. Hilma Favors.   No orders of the defined types were placed in this encounter.  Meds ordered this encounter  Medications  .  Fremanezumab-vfrm (AJOVY) 225 MG/1.5ML SOAJ    Sig: Inject 225 mg into the skin every 30 (thirty) days.    Dispense:  3 mL    Refill:  0    [id number] 8/23  . acetaZOLAMIDE (DIAMOX) 500 MG capsule    Sig: Take 1 capsule (500 mg total) by mouth 2 (two) times daily.    Dispense:  180 capsule    Refill:  3    Cc: Sharilyn Sites, MD,    Sarina Ill, MD  [facility] [address] [place], [place] [address]  Phone [phone] Fax [phone]  I spent 30  minutes of face-to-face and non-face-to-face time with patient on the  1. IIH (idiopathic intracranial hypertension)   2. Chronic migraine without aura without status migrainosus, not intractable    diagnosis.  This included previsit chart review, lab review, study review, order entry, electronic health record documentation, patient education on the different diagnostic and therapeutic options, counseling and coordination of care, risks and benefits of management, compliance, or risk factor reduction

## 2020-10-14 ENCOUNTER — Ambulatory Visit: Payer: 59 | Admitting: Neurology

## 2020-10-14 ENCOUNTER — Other Ambulatory Visit: Payer: Self-pay | Admitting: Neurology

## 2020-10-14 ENCOUNTER — Encounter: Payer: Self-pay | Admitting: Neurology

## 2020-10-14 VITALS — BP 114/79 | HR 79 | Ht 71.5 in | Wt 255.0 lb

## 2020-10-14 DIAGNOSIS — G43709 Chronic migraine without aura, not intractable, without status migrainosus: Secondary | ICD-10-CM | POA: Diagnosis not present

## 2020-10-14 DIAGNOSIS — G932 Benign intracranial hypertension: Secondary | ICD-10-CM | POA: Diagnosis not present

## 2020-10-14 MED ORDER — ACETAZOLAMIDE ER 500 MG PO CP12: 500.0000 mg | ORAL_CAPSULE | Freq: Two times a day (BID) | ORAL | 3 refills | Status: DC

## 2020-10-14 MED ORDER — AJOVY 225 MG/1.5ML ~~LOC~~ SOAJ
225.0000 mg | SUBCUTANEOUS | 0 refills | Status: DC
Start: 2020-10-14 — End: 2021-01-20

## 2020-10-14 MED FILL — ACETAZOLAMIDE ER 500 MG CAP: 500 | 90 days supply | Qty: 180 | Fill #0

## 2020-10-14 NOTE — Patient Instructions (Addendum)
- Sleep evaluation, had OSA in the past, morning headaches, fatigue. She has not gone yet, encouraged her to do so and also discussed lose weight(Recommended the Healthy Weight and [place] [phone]) - Continue Amitriptyline at bedtime: migraine prevention and sleep - Continue Ajovy: migraine prevention:  - Acute migraines: Continue Rizatriptan. Please take one tablet at the onset of your headache. If it does not improve the symptoms please take one additional tablet. Do not take more then 2 tablets in 24hrs. Do not take use more then 2 to 3 times in a week. - MRI brain: Opening pressure LP was 23. MRI showed Enlarged partially empty sella and slightly enlarged optic nerve sheaths. Findings are non-specific but can be seen in association with idiopathic intracranial hypertension (pseudotumor cerebri).  - Saw ophthalmology, they released her to optometry   Idiopathic Intracranial Hypertension  Idiopathic intracranial hypertension (IIH) is a condition that increases pressure around the brain. The fluid that surrounds the brain and spinal cord (cerebrospinal fluid, or CSF) increases and causes the pressure. Idiopathic means that the cause of this condition is not known. IIH affects the brain and spinal cord (neurological disorder). If this condition is not treated, it can cause vision loss or blindness. What are the causes? The cause of this condition is not known. What increases the risk? The following factors may make you more likely to develop this condition:  Being very overweight (obese).  Being a female between the ages of 65 and 57 years old, who has not gone through menopause.  Taking certain medicines, such as birth control or steroids. What are the signs or symptoms? Symptoms of this condition include:  Headaches. This is the most common symptom.  Brief episodes of total blindness.  Double vision, blurred vision, or poor side (peripheral) vision.  Pain in the shoulders or  neck.  Nausea and vomiting.  A sound like rushing water or a pulsing sound within the ears (pulsatile tinnitus), or ringing in the ears. How is this diagnosed? This condition may be diagnosed based on:  Your symptoms and medical history.  Imaging tests of the brain, such as: ? CT scan. ? MRI. ? Magnetic resonance venogram (MRV) to check the veins.  Diagnostic lumbar puncture. This is a procedure to remove and examine a sample of cerebrospinal fluid. This procedure can determine whether too much fluid may be causing IIH.  A thorough eye exam to check for swelling or nerve damage in the eyes. How is this treated? Treatment for this condition depends on the symptoms. The goal of treatment is to decrease the pressure around your brain. Common treatments include:  Weight loss through healthy eating, salt restriction, and exercise, if you are overweight.  Medicines to decrease the production of spinal fluid and lower the pressure within your skull.  Medicines to prevent or treat headaches. Other treatments may include:  Surgery to place drains (shunts) in your brain for removing excess fluid.  Lumbar puncture to remove excess cerebrospinal fluid. Follow these instructions at home:  If you are overweight or obese, work with your health care provider to lose weight.  Take over-the-counter and prescription medicines only as told by your health care provider.  Ask your health care provider if the medicine prescribed to you requires you to avoid driving or using machinery.  Do not use any products that contain nicotine or tobacco, such as cigarettes, e-cigarettes, and chewing tobacco. If you need help quitting, ask your health care provider.  Keep all follow-up visits  as told by your health care provider. This is important. Contact a health care provider if: You have changes in your vision, such as:  Double vision.  Blurred vision.  Poor peripheral vision. Get help right away  if: You have any of the following symptoms and they get worse or do not get better:  Headaches.  Nausea.  Vomiting.  Sudden trouble seeing. Summary  Idiopathic intracranial hypertension (IIH) is a condition that increases pressure around the brain. The cause is not known (is idiopathic).  The most common symptom of IIH is headaches. Vision changes, pain in the shoulders or neck, nausea, and vomiting may also occur.  Treatment for this condition depends on your symptoms. The goal of treatment is to decrease the pressure around your brain.  If you are overweight or obese, work with your health care provider to lose weight.  Take over-the-counter and prescription medicines only as told by your health care provider. This information is not intended to replace advice given to you by your health care provider. Make sure you discuss any questions you have with your health care provider. Document Revised: 09/02/2019 Document Reviewed: 09/02/2019 Elsevier Patient Education  2021 [employer].

## 2020-10-24 ENCOUNTER — Ambulatory Visit: Payer: 59 | Admitting: Adult Health

## 2020-10-30 ENCOUNTER — Other Ambulatory Visit: Payer: Self-pay | Admitting: Neurology

## 2020-10-30 DIAGNOSIS — G43709 Chronic migraine without aura, not intractable, without status migrainosus: Secondary | ICD-10-CM

## 2020-10-30 MED FILL — FAMOTIDINE 40 MG TABS: 40 | 30 days supply | Qty: 30 | Fill #1

## 2020-10-30 MED FILL — CYCLOBENZAPRINE HCL 10 MG T: 10 | 10 days supply | Qty: 30 | Fill #0

## 2020-10-30 MED FILL — AMITRIPTYLINE HCL 25 MG TAB: 25 | 30 days supply | Qty: 30 | Fill #0

## 2020-10-30 MED FILL — AJOVY 225 MG/1.5ML SOAJ: 225 | 30 days supply | Qty: 2 | Fill #3

## 2020-10-30 MED FILL — LOSARTAN POTASSIUM 100 MG T: 100 | 90 days supply | Qty: 90 | Fill #0

## 2020-11-08 DIAGNOSIS — E538 Deficiency of other specified B group vitamins: Secondary | ICD-10-CM | POA: Diagnosis not present

## 2020-11-12 ENCOUNTER — Telehealth: Payer: Self-pay | Admitting: *Deleted

## 2020-11-12 NOTE — Telephone Encounter (Signed)
Patient will need Ajovy PA (currently using samples) however her insurance requires Aimovig and Emgality trials first. Pt has not tried either one.

## 2020-11-12 NOTE — Telephone Encounter (Signed)
Spoke with patient and discussed the options as Ajovy is not covered. Pt prefers to use savings card as long as she can. She verbalized appreciation for the call.

## 2020-11-12 NOTE — Telephone Encounter (Signed)
We can either have her use the copay card (She is a [employer]) or she can switch to [employer]. Her choice thanks

## 2020-12-08 MED FILL — AMITRIPTYLINE HCL 25 MG TAB: 25 | 30 days supply | Qty: 30 | Fill #1

## 2020-12-09 ENCOUNTER — Other Ambulatory Visit: Payer: Self-pay | Admitting: Neurology

## 2020-12-09 MED FILL — AMLODIPINE BESYLATE 5 MG TA: 5 | 90 days supply | Qty: 90 | Fill #1

## 2020-12-09 MED FILL — MONTELUKAST SOD 10 MG TAB: 10 | 30 days supply | Qty: 30 | Fill #0

## 2020-12-10 ENCOUNTER — Other Ambulatory Visit: Payer: Self-pay | Admitting: Neurology

## 2020-12-10 MED FILL — AJOVY 225 MG/1.5ML SOAJ: 225 | 30 days supply | Qty: 2 | Fill #0

## 2020-12-16 DIAGNOSIS — M1711 Unilateral primary osteoarthritis, right knee: Secondary | ICD-10-CM | POA: Diagnosis not present

## 2020-12-16 DIAGNOSIS — Z96652 Presence of left artificial knee joint: Secondary | ICD-10-CM | POA: Diagnosis not present

## 2020-12-26 ENCOUNTER — Other Ambulatory Visit: Payer: Self-pay | Admitting: Orthopedic Surgery

## 2020-12-26 MED FILL — IBUPROFEN 800 MG TAB: 800 | 30 days supply | Qty: 90 | Fill #0

## 2020-12-27 DIAGNOSIS — R7309 Other abnormal glucose: Secondary | ICD-10-CM | POA: Diagnosis not present

## 2020-12-27 DIAGNOSIS — Z1331 Encounter for screening for depression: Secondary | ICD-10-CM | POA: Diagnosis not present

## 2020-12-27 DIAGNOSIS — D511 Vitamin B12 deficiency anemia due to selective vitamin B12 malabsorption with proteinuria: Secondary | ICD-10-CM | POA: Diagnosis not present

## 2020-12-27 DIAGNOSIS — I10 Essential (primary) hypertension: Secondary | ICD-10-CM | POA: Diagnosis not present

## 2020-12-27 DIAGNOSIS — J45909 Unspecified asthma, uncomplicated: Secondary | ICD-10-CM | POA: Diagnosis not present

## 2020-12-27 DIAGNOSIS — Z6835 Body mass index (BMI) 35.0-35.9, adult: Secondary | ICD-10-CM | POA: Diagnosis not present

## 2020-12-27 DIAGNOSIS — K5090 Crohn's disease, unspecified, without complications: Secondary | ICD-10-CM | POA: Diagnosis not present

## 2020-12-27 DIAGNOSIS — Z1389 Encounter for screening for other disorder: Secondary | ICD-10-CM | POA: Diagnosis not present

## 2020-12-27 DIAGNOSIS — K5732 Diverticulitis of large intestine without perforation or abscess without bleeding: Secondary | ICD-10-CM | POA: Diagnosis not present

## 2020-12-27 DIAGNOSIS — M1991 Primary osteoarthritis, unspecified site: Secondary | ICD-10-CM | POA: Diagnosis not present

## 2020-12-27 DIAGNOSIS — Z01810 Encounter for preprocedural cardiovascular examination: Secondary | ICD-10-CM | POA: Diagnosis not present

## 2021-01-06 ENCOUNTER — Other Ambulatory Visit (INDEPENDENT_AMBULATORY_CARE_PROVIDER_SITE_OTHER): Payer: Self-pay | Admitting: Gastroenterology

## 2021-01-07 ENCOUNTER — Other Ambulatory Visit (HOSPITAL_COMMUNITY): Payer: Self-pay

## 2021-01-07 MED ORDER — FAMOTIDINE 40 MG PO TABS
ORAL_TABLET | Freq: Every day | ORAL | 1 refills | Status: DC
  Filled 2021-01-07: qty 30, 30d supply, fill #0
  Filled 2021-02-19: qty 30, 30d supply, fill #1

## 2021-01-10 ENCOUNTER — Other Ambulatory Visit (HOSPITAL_COMMUNITY): Payer: Self-pay

## 2021-01-10 MED FILL — Fremanezumab-vfrm Subcutaneous Soln Auto-inj 225 MG/1.5ML: SUBCUTANEOUS | 30 days supply | Qty: 1.5 | Fill #0 | Status: CN

## 2021-01-10 MED FILL — Cyclobenzaprine HCl Tab 10 MG: ORAL | 10 days supply | Qty: 30 | Fill #0 | Status: AC

## 2021-01-10 MED FILL — Amitriptyline HCl Tab 25 MG: ORAL | 30 days supply | Qty: 30 | Fill #0 | Status: AC

## 2021-01-10 MED FILL — Montelukast Sodium Tab 10 MG (Base Equiv): ORAL | 90 days supply | Qty: 90 | Fill #0 | Status: AC

## 2021-01-13 ENCOUNTER — Other Ambulatory Visit (HOSPITAL_COMMUNITY): Payer: Self-pay

## 2021-01-13 MED FILL — Fremanezumab-vfrm Subcutaneous Soln Auto-inj 225 MG/1.5ML: SUBCUTANEOUS | 30 days supply | Qty: 1.5 | Fill #0 | Status: CN

## 2021-01-13 NOTE — Patient Instructions (Addendum)
DUE TO COVID-19 ONLY ONE VISITOR IS ALLOWED TO COME WITH YOU AND STAY IN THE WAITING ROOM ONLY DURING PRE OP AND PROCEDURE DAY OF SURGERY. THE 1 VISITOR  MAY VISIT WITH YOU AFTER SURGERY IN YOUR PRIVATE ROOM DURING VISITING HOURS ONLY!  YOU NEED TO HAVE A COVID 19 TEST ON: 01/21/21 @ 2:45 PM, THIS TEST MUST BE DONE BEFORE SURGERY,  COVID TESTING SITE [place] [id number], IT IS ON THE RIGHT GOING OUT [address] APPROXIMATELY  2 MINUTES PAST [employer] ON THE RIGHT. ONCE YOUR COVID TEST IS COMPLETED,  PLEASE BEGIN THE QUARANTINE INSTRUCTIONS AS OUTLINED IN YOUR HANDOUT.                [name]   Your procedure is scheduled on: 01/23/21   Report to [facility] Main  Entrance   Report to admitting at: 6:10 AM     Call this number if you have problems the morning of surgery [phone]    Remember: NO SOLID FOOD AFTER MIDNIGHT THE NIGHT PRIOR TO SURGERY. NOTHING BY MOUTH EXCEPT CLEAR LIQUIDS UNTIL: 5:40 AM . PLEASE FINISH ENSURE DRINK PER SURGEON ORDER  WHICH NEEDS TO BE COMPLETED AT : 5:40 AM.  CLEAR LIQUID DIET  Foods Allowed                                                                     Foods Excluded  Coffee and tea, regular and decaf                             liquids that you cannot  Plain Jell-O any favor except red or purple                                           see through such as: Fruit ices (not with fruit pulp)                                     milk, soups, orange juice  Iced Popsicles                                    All solid food Carbonated beverages, regular and diet                                    Cranberry, grape and apple juices Sports drinks like Gatorade Lightly seasoned clear broth or consume(fat free) Sugar, honey syrup  Sample Menu Breakfast                                Lunch  Supper Cranberry juice                    Beef broth                             Chicken broth Jell-O                                     Grape juice                           Apple juice Coffee or tea                        Jell-O                                      Popsicle                                                Coffee or tea                        Coffee or tea  _____________________________________________________________________   BRUSH YOUR TEETH MORNING OF SURGERY AND RINSE YOUR MOUTH OUT, NO CHEWING GUM CANDY OR MINTS.    Take these medicines the morning of surgery with A SIP OF WATER: amlodipine,famotidine,pantoprazole.Use inhalers as usual.                               You may not have any metal on your body including hair pins and              piercings  Do not wear jewelry, make-up, lotions, powders or perfumes, deodorant             Do not wear nail polish on your fingernails.  Do not shave  48 hours prior to surgery.    Do not bring valuables to the hospital. [place].  Contacts, dentures or bridgework may not be worn into surgery.  Leave suitcase in the car. After surgery it may be brought to your room.     Patients discharged the day of surgery will not be allowed to drive home. IF YOU ARE HAVING SURGERY AND GOING HOME THE SAME DAY, YOU MUST HAVE AN ADULT TO DRIVE YOU HOME AND BE WITH YOU FOR 24 HOURS. YOU MAY GO HOME BY TAXI OR UBER OR ORTHERWISE, BUT AN ADULT MUST ACCOMPANY YOU HOME AND STAY WITH YOU FOR 24 HOURS.  Name and phone number of your driver:  Special Instructions: N/A              Please read over the following fact sheets you were given: _____________________________________________________________________          [facility] - Preparing for Surgery Before surgery, you can play an important role.  Because skin is not sterile, your skin needs to be as free of germs as  possible.  You can reduce the number of germs on your skin by washing with CHG (chlorahexidine gluconate) soap  before surgery.  CHG is an antiseptic cleaner which kills germs and bonds with the skin to continue killing germs even after washing. Please DO NOT use if you have an allergy to CHG or antibacterial soaps.  If your skin becomes reddened/irritated stop using the CHG and inform your nurse when you arrive at Short Stay. Do not shave (including legs and underarms) for at least 48 hours prior to the first CHG shower.  You may shave your face/neck. Please follow these instructions carefully:  1.  Shower with CHG Soap the night before surgery and the  morning of Surgery.  2.  If you choose to wash your hair, wash your hair first as usual with your  normal  shampoo.  3.  After you shampoo, rinse your hair and body thoroughly to remove the  shampoo.                           4.  Use CHG as you would any other liquid soap.  You can apply chg directly  to the skin and wash                       Gently with a scrungie or clean washcloth.  5.  Apply the CHG Soap to your body ONLY FROM THE NECK DOWN.   Do not use on face/ open                           Wound or open sores. Avoid contact with eyes, ears mouth and genitals (private parts).                       Wash face,  Genitals (private parts) with your normal soap.             6.  Wash thoroughly, paying special attention to the area where your surgery  will be performed.  7.  Thoroughly rinse your body with warm water from the neck down.  8.  DO NOT shower/wash with your normal soap after using and rinsing off  the CHG Soap.                9.  Pat yourself dry with a clean towel.            10.  Wear clean pajamas.            11.  Place clean sheets on your bed the night of your first shower and do not  sleep with pets. Day of Surgery : Do not apply any lotions/deodorants the morning of surgery.  Please wear clean clothes to the hospital/surgery center.  FAILURE TO FOLLOW THESE INSTRUCTIONS MAY RESULT IN THE CANCELLATION OF YOUR SURGERY PATIENT  SIGNATURE_________________________________  NURSE SIGNATURE__________________________________  ________________________________________________________________________   [name]  An incentive spirometer is a tool that can help keep your lungs clear and active. This tool measures how well you are filling your lungs with each breath. Taking long deep breaths may help reverse or decrease the chance of developing breathing (pulmonary) problems (especially infection) following:  A long period of time when you are unable to move or be active. BEFORE THE PROCEDURE   If the spirometer includes an indicator to show your best effort, your nurse or respiratory  therapist will set it to a desired goal.  If possible, sit up straight or lean slightly forward. Try not to slouch.  Hold the incentive spirometer in an upright position. INSTRUCTIONS FOR USE  1. Sit on the edge of your bed if possible, or sit up as far as you can in bed or on a chair. 2. Hold the incentive spirometer in an upright position. 3. Breathe out normally. 4. Place the mouthpiece in your mouth and seal your lips tightly around it. 5. Breathe in slowly and as deeply as possible, raising the piston or the ball toward the top of the column. 6. Hold your breath for 3-5 seconds or for as long as possible. Allow the piston or ball to fall to the bottom of the column. 7. Remove the mouthpiece from your mouth and breathe out normally. 8. Rest for a few seconds and repeat Steps 1 through 7 at least 10 times every 1-2 hours when you are awake. Take your time and take a few normal breaths between deep breaths. 9. The spirometer may include an indicator to show your best effort. Use the indicator as a goal to work toward during each repetition. 10. After each set of 10 deep breaths, practice coughing to be sure your lungs are clear. If you have an incision (the cut made at the time of surgery), support your incision when coughing  by placing a pillow or rolled up towels firmly against it. Once you are able to get out of bed, walk around indoors and cough well. You may stop using the incentive spirometer when instructed by your caregiver.  RISKS AND COMPLICATIONS  Take your time so you do not get dizzy or light-headed.  If you are in pain, you may need to take or ask for pain medication before doing incentive spirometry. It is harder to take a deep breath if you are having pain. AFTER USE  Rest and breathe slowly and easily.  It can be helpful to keep track of a log of your progress. Your caregiver can provide you with a simple table to help with this. If you are using the spirometer at home, follow these instructions: [place] IF:   You are having difficultly using the spirometer.  You have trouble using the spirometer as often as instructed.  Your pain medication is not giving enough relief while using the spirometer.  You develop fever of 100.5 F (38.1 C) or higher. SEEK IMMEDIATE MEDICAL CARE IF:   You cough up bloody sputum that had not been present before.  You develop fever of 102 F (38.9 C) or greater.  You develop worsening pain at or near the incision site. MAKE SURE YOU:   Understand these instructions.  Will watch your condition.  Will get help right away if you are not doing well or get worse. Document Released: 02/01/2007 Document Revised: 12/14/2011 Document Reviewed: 04/04/2007 [facility] Patient Information 2014 [place], [place].   ________________________________________________________________________

## 2021-01-14 ENCOUNTER — Other Ambulatory Visit (HOSPITAL_COMMUNITY): Payer: Self-pay

## 2021-01-14 MED FILL — Fremanezumab-vfrm Subcutaneous Soln Auto-inj 225 MG/1.5ML: SUBCUTANEOUS | 30 days supply | Qty: 1.5 | Fill #0 | Status: CN

## 2021-01-14 NOTE — Telephone Encounter (Signed)
Completed [employer] PA on cover my meds. Key: [id number].  Awaiting determination from [employer].

## 2021-01-15 ENCOUNTER — Encounter (HOSPITAL_COMMUNITY)
Admission: RE | Admit: 2021-01-15 | Discharge: 2021-01-15 | Disposition: A | Discharge status: Home / Self Care | Payer: 59 | Source: Ambulatory Visit | Attending: Orthopedic Surgery | Admitting: Orthopedic Surgery

## 2021-01-15 ENCOUNTER — Encounter (HOSPITAL_COMMUNITY): Payer: Self-pay

## 2021-01-15 ENCOUNTER — Other Ambulatory Visit (HOSPITAL_COMMUNITY): Payer: Self-pay

## 2021-01-15 ENCOUNTER — Other Ambulatory Visit: Payer: Self-pay

## 2021-01-15 DIAGNOSIS — Z01818 Encounter for other preprocedural examination: Secondary | ICD-10-CM | POA: Insufficient documentation

## 2021-01-15 LAB — BASIC METABOLIC PANEL
Anion gap: 8 (ref 5–15)
BUN: 12 mg/dL (ref 6–20)
CO2: 22 mmol/L (ref 22–32)
Calcium: 9.3 mg/dL (ref 8.9–10.3)
Chloride: 110 mmol/L (ref 98–111)
Creatinine, Ser: 0.89 mg/dL (ref 0.44–1.00)
GFR, Estimated: >60 mL/min (ref >60)
Glucose, Bld: 94 mg/dL (ref 70–99)
Potassium: 3.6 mmol/L (ref 3.5–5.1)
Sodium: 140 mmol/L (ref 135–145)

## 2021-01-15 LAB — SURGICAL PCR SCREEN
MRSA, PCR: NEGATIVE
Staphylococcus aureus: NEGATIVE

## 2021-01-15 LAB — CBC
HCT: 41.5 % (ref 36.0–46.0)
Hemoglobin: 13.3 g/dL (ref 12.0–15.0)
MCH: 28.5 pg (ref 26.0–34.0)
MCHC: 32 g/dL (ref 30.0–36.0)
MCV: 88.9 fL (ref 80.0–100.0)
Platelets: 368 10*3/uL (ref 150–400)
RBC: 4.67 MIL/uL (ref 3.87–5.11)
RDW: 14.3 % (ref 11.5–15.5)
WBC: 4.6 10*3/uL (ref 4.0–10.5)
nRBC: 0 % (ref 0.0–0.2)

## 2021-01-15 NOTE — Progress Notes (Signed)
COVID Vaccine Completed: Yes Date COVID Vaccine completed: 12/26/19 COVID vaccine manufacturer:  Moderna   PCP - Dr. Al Pimple Cardiologist -   Chest x-ray -  EKG -  Stress Test -  ECHO - 04/27/16 Cardiac Cath -  Pacemaker/ICD device last checked:  Sleep Study - YES CPAP - NO  Fasting Blood Sugar -  Checks Blood Sugar _____ times a day  Blood Thinner Instructions: Aspirin Instructions: Last Dose:  Anesthesia review: Hx: HTN,OSA(No CPAP)  Patient denies shortness of breath, fever, cough and chest pain at PAT appointment   Patient verbalized understanding of instructions that were given to them at the PAT appointment. Patient was also instructed that they will need to review over the PAT instructions again at home before surgery.

## 2021-01-17 ENCOUNTER — Other Ambulatory Visit (HOSPITAL_COMMUNITY): Payer: Self-pay

## 2021-01-20 ENCOUNTER — Other Ambulatory Visit (HOSPITAL_COMMUNITY): Payer: Self-pay

## 2021-01-20 MED ORDER — EMGALITY 120 MG/ML ~~LOC~~ SOAJ
120.0000 mg | SUBCUTANEOUS | 11 refills | Status: DC
  Filled 2021-01-20: qty 1, 30d supply, fill #0
  Filled 2021-03-23: qty 1, 30d supply, fill #1
  Filled 2021-04-25: qty 1, 30d supply, fill #2
  Filled 2021-05-19: qty 1, 30d supply, fill #3
  Filled 2021-06-20: qty 1, 30d supply, fill #4
  Filled 2021-07-19: qty 1, 30d supply, fill #5
  Filled 2021-08-18 – 2021-09-02 (×2): qty 1, 30d supply, fill #6
  Filled 2021-10-09: qty 1, 30d supply, fill #7
  Filled 2021-11-10: qty 1, 30d supply, fill #8
  Filled 2021-12-09: qty 1, 30d supply, fill #9
  Filled 2022-01-07: qty 1, 30d supply, fill #10

## 2021-01-20 NOTE — Addendum Note (Signed)
Addended by: Gildardo Griffes on: 01/20/2021 12:03 PM   Modules accepted: Orders

## 2021-01-21 ENCOUNTER — Other Ambulatory Visit (HOSPITAL_COMMUNITY): Payer: Self-pay

## 2021-01-21 ENCOUNTER — Other Ambulatory Visit (HOSPITAL_COMMUNITY)
Admission: RE | Admit: 2021-01-21 | Discharge: 2021-01-21 | Disposition: A | Discharge status: Home / Self Care | Payer: 59 | Source: Ambulatory Visit | Attending: Orthopedic Surgery | Admitting: Orthopedic Surgery

## 2021-01-21 DIAGNOSIS — Z20822 Contact with and (suspected) exposure to covid-19: Secondary | ICD-10-CM | POA: Insufficient documentation

## 2021-01-21 DIAGNOSIS — Z01812 Encounter for preprocedural laboratory examination: Secondary | ICD-10-CM | POA: Insufficient documentation

## 2021-01-21 LAB — SARS CORONAVIRUS 2 (TAT 6-24 HRS): SARS Coronavirus 2: NEGATIVE

## 2021-01-22 LAB — TYPE AND SCREEN
ABO/RH(D): O POS
Antibody Screen: NEGATIVE

## 2021-01-22 NOTE — H&P (Signed)
TOTAL KNEE ADMISSION H&P  Patient is being admitted for right total knee arthroplasty.  Subjective:  Chief Complaint:right knee pain.  HPI: [name], 55 y.o. female, has a history of pain and functional disability in the right knee due to arthritis and has failed non-surgical conservative treatments for greater than 12 weeks to includecorticosteriod injections and activity modification.  Onset of symptoms was gradual, starting 2 years ago with gradually worsening course since that time. The patient noted prior procedures on the knee to include  arthroscopy and menisectomy on the right knee(s).  Patient currently rates pain in the right knee(s) at 8 out of 10 with activity. Patient has worsening of pain with activity and weight bearing and pain that interferes with activities of daily living.  Patient has evidence of joint space narrowing by imaging studies. There is no active infection.  Patient Active Problem List   Diagnosis Date Noted  . IIH (idiopathic intracranial hypertension) 10/14/2020  . Discoloration of skin of finger 09/12/2020  . Bilateral hand pain 09/12/2020  . Paresthesia of skin 09/12/2020  . Chronic migraine without aura without status migrainosus, not intractable 06/22/2020  . Iron deficiency anemia 06/20/2019  . S/P left TKA 06/06/2019  . Status post total left knee replacement 06/06/2019  . Special screening for malignant neoplasms, colon 03/28/2019  . LLQ abdominal pain 09/26/2018  . Derangement of anterior horn of lateral meniscus of left knee   . Derangement of posterior horn of medial meniscus of left knee   . Chest pain 04/25/2016  . Hypokalemia 04/25/2016  . Hyperglycemia 04/25/2016  . Diverticulitis of colon 07/17/2014  . Nausea without vomiting 07/17/2014  . Crohn disease ([place]) 07/17/2014  . History of arthroscopy of right knee 07/25/2012  . Difficulty in walking(719.7) 06/29/2012  . Weakness of right leg 06/29/2012  . Posterior tibial tendonitis  11/11/2011  . PTTD (posterior tibial tendon dysfunction) 11/11/2011  . Knee pain, right 11/11/2011  . Chest pain 07/16/2011  . Primary osteoarthritis of left knee 02/25/2010  . PAIN IN JOINT, MULTIPLE SITES 02/25/2010  . Essential hypertension 08/13/2009  . HIATAL HERNIA 08/13/2009  . PALPITATIONS 08/13/2009  . DYSPNEA 08/13/2009   Past Medical History:  Diagnosis Date  . Anxiety   . Arthritis   . Asthma   . Cervical hyperplasia    hisotry of  . Chest pain    cath 2005 norm cors, repeat cath Feb 2011 normal cors and only minimally  elevated pulmonary pressures  . Crohn disease ([place])   . Diverticulitis of colon 07/17/2014  . Diverticulosis   . Dyspnea   . GERD (gastroesophageal reflux disease)   . Hiatal hernia   . History of iron deficiency anemia   . Hypertension   . Idiopathic intracranial hypertension    Per patient  . Migraines   . Obesity   . Palpitations   . Pneumonia 2008   14 days hospitalization, bilateral  . PONV (postoperative nausea and vomiting)   . Sleep apnea sleep study 11/03/2009    cpap; does sometimes, doesnt use every night. weight loss no longer using CPAP  . Vitamin B deficiency     Past Surgical History:  Procedure Laterality Date  . ABDOMINAL HYSTERECTOMY    . ANKLE ARTHROSCOPY  06/01/2012   Procedure: ANKLE ARTHROSCOPY;  Surgeon: Colin Rhein, MD;  Location: [place];  Service: Orthopedics;  Laterality: Left;  left ankle arthorsocpy with extensive debridement and gastroc slide  . ANKLE SURGERY Left 05/11/2018  .  BIOPSY  04/26/2019   Procedure: BIOPSY;  Surgeon: Rogene Houston, MD;  [facility];  Service: Endoscopy;;  ileum colon  . CARDIAC CATHETERIZATION  11/22/2009 and 2005   WNL  . CESAREAN SECTION    . CHOLECYSTECTOMY  09/08/2006   lap. chole.  . CHONDROPLASTY  06/17/2012   Procedure: CHONDROPLASTY;  Surgeon: Carole Civil, MD;  Location: [facility];  Service: Orthopedics;  Laterality: Right;  right  patella  . COLONOSCOPY N/A 04/26/2019   Procedure: COLONOSCOPY;  Surgeon: Rogene Houston, MD;  [facility];  Service: Endoscopy;  Laterality: N/A;  100  . ESOPHAGOGASTRODUODENOSCOPY N/A 05/14/2015   Procedure: ESOPHAGOGASTRODUODENOSCOPY (EGD);  Surgeon: Rogene Houston, MD;  [facility];  Service: Endoscopy;  Laterality: N/A;  730  . GIVENS CAPSULE STUDY N/A 05/24/2019   Procedure: GIVENS CAPSULE STUDY;  Surgeon: Rogene Houston, MD;  [facility];  Service: Endoscopy;  Laterality: N/A;  7:30  . INGUINAL HERNIA REPAIR  10/30/2008   right  . KNEE ARTHROSCOPY Right 2013  . KNEE ARTHROSCOPY WITH LATERAL MENISECTOMY Left 12/23/2016   Procedure: LEFT KNEE ARTHROSCOPY WITH LATERAL MENISECTOMY;  Surgeon: Carole Civil, MD;  Location: [facility];  Service: Orthopedics;  Laterality: Left;  . KNEE ARTHROSCOPY WITH MEDIAL MENISECTOMY Left 12/23/2016   Procedure: LEFT KNEE ARTHROSCOPY WITH MEDIAL MENISECTOMY CHONDROPLASTY PATELLA  AND MEDIAL FEMORAL CONDYLE LEFT KNEE;  Surgeon: Carole Civil, MD;  Location: [facility];  Service: Orthopedics;  Laterality: Left;  . SHOULDER ARTHROSCOPY WITH SUBACROMIAL DECOMPRESSION AND BICEP TENDON REPAIR Left 12/21/2017   Procedure: Left shoulder arthroscopic biceps tenodesis, SAD, DCR and labrum debridement;  Surgeon: Nicholes Stairs, MD;  Location: [place];  Service: Orthopedics;  Laterality: Left;  120 mins  . SHOULDER SURGERY     right x 2   . TOTAL KNEE ARTHROPLASTY Left 06/06/2019   Procedure: TOTAL KNEE ARTHROPLASTY;  Surgeon: Paralee Cancel, MD;  Location: [facility];  Service: Orthopedics;  Laterality: Left;  70 mins    No current facility-administered medications for this encounter.   Current Outpatient Medications  Medication Sig Dispense Refill Last Dose  . acetaminophen (TYLENOL) 500 MG tablet Take 2 tablets (1,000 mg total) by mouth every 8 (eight) hours. (Patient taking differently: Take 1,000 mg by mouth every 6 (six) hours  as needed for moderate pain.) 30 tablet 0   . acetaZOLAMIDE (DIAMOX) 500 MG capsule TAKE 1 CAPSULE BY MOUTH 2 TIMES DAILY (Patient taking differently: Take 500 mg by mouth 2 (two) times daily.) 180 capsule 3   . albuterol (PROVENTIL) (2.5 MG/3ML) 0.083% nebulizer solution Take 2.5 mg by nebulization every 6 (six) hours as needed for shortness of breath.     Marland Kitchen albuterol (VENTOLIN HFA) 108 (90 Base) MCG/ACT inhaler Inhale 1-2 puffs into the lungs every 6 (six) hours as needed for wheezing or shortness of breath.     Marland Kitchen amitriptyline (ELAVIL) 25 MG tablet TAKE 1 TABLET BY MOUTH AT BEDTIME (Patient taking differently: Take 25 mg by mouth at bedtime.) 30 tablet 3   . amLODipine (NORVASC) 5 MG tablet TAKE 1 TABLET BY MOUTH ONCE DAILY (Patient taking differently: Take 5 mg by mouth at bedtime.) 90 tablet 2   . B Complex Vitamins (VITAMIN B COMPLEX 100 IJ) Inject 1,000 mg as directed every 30 (thirty) days.     . ciprofloxacin (CIPRO) 500 MG tablet Take 500 mg by mouth 2 (two) times daily as needed (diverticulitis).     Marland Kitchen  cyclobenzaprine (FLEXERIL) 10 MG tablet TAKE 1 TABLET BY MOUTH 3 TIMES DAILY 30 tablet 1   . diphenhydrAMINE (BENADRYL) 25 MG tablet Take 25 mg by mouth every 6 (six) hours as needed for allergies.     Marland Kitchen EPINEPHrine (EPIPEN) 0.3 mg/0.3 mL SOAJ injection Inject 0.3 mLs (0.3 mg total) into the muscle once. 1 Device 0   . famotidine (PEPCID) 40 MG tablet TAKE 1 TABLET BY MOUTH AT BEDTIME. (Patient taking differently: Take 40 mg by mouth at bedtime.) 30 tablet 1   . gabapentin (NEURONTIN) 300 MG capsule Take 1 capsule (300 mg total) by mouth at bedtime. (Patient taking differently: Take 300 mg by mouth at bedtime as needed (pain).) 90 capsule 5   . ibuprofen (ADVIL) 800 MG tablet TAKE 1 TABLET BY MOUTH EVERY 8 HOURS AS NEEDED (Patient taking differently: Take 800 mg by mouth every 8 (eight) hours as needed for moderate pain.) 90 tablet 1   . loratadine (CLARITIN) 10 MG tablet Take 10 mg by mouth  at bedtime.     Marland Kitchen losartan (COZAAR) 100 MG tablet TAKE 1 TABLET BY MOUTH ONCE DAILY (Patient taking differently: Take 100 mg by mouth at bedtime.) 90 tablet 1   . Magnesium 500 MG CAPS Take 2,000 mg by mouth at bedtime.      . meclizine (ANTIVERT) 25 MG tablet Take 25 mg by mouth 3 (three) times daily as needed for dizziness.     . metroNIDAZOLE (FLAGYL) 500 MG tablet Take 500 mg by mouth 3 (three) times daily as needed.     . montelukast (SINGULAIR) 10 MG tablet TAKE 1 TABLET BY MOUTH EVERY EVENING 90 tablet 2   . Multiple Vitamin (MULTIVITAMIN) tablet Take 1 tablet by mouth daily.     . ondansetron (ZOFRAN) 4 MG tablet Take 4 mg by mouth every 8 (eight) hours as needed for nausea or vomiting.     . pantoprazole (PROTONIX) 40 MG tablet TAKE 1 TABLET BY MOUTH 2 TIMES DAILY (Patient taking differently: Take 40 mg by mouth 2 (two) times daily.) 90 tablet 1   . polyethylene glycol (MIRALAX / GLYCOLAX) 17 g packet Take 17 g by mouth 2 (two) times daily. (Patient taking differently: Take 17 g by mouth daily as needed for moderate constipation.) 28 packet 0   . rizatriptan (MAXALT-MLT) 10 MG disintegrating tablet Take 1 tablet (10 mg total) by mouth as needed for migraine. May repeat in 2 hours if needed 9 tablet 11   . Vitamin D-Vitamin K (K2 PLUS D3 PO) Take 2 capsules by mouth daily.     Marland Kitchen amLODipine (NORVASC) 5 MG tablet Take 5 mg by mouth at bedtime. (Patient not taking: Reported on 01/09/2021)   Not Taking at Unknown time  . cyclobenzaprine (FLEXERIL) 10 MG tablet Take 1 tablet (10 mg total) by mouth 3 (three) times daily as needed for muscle spasms. (Patient not taking: No sig reported) 40 tablet 0 Not Taking at Unknown time  . cyclobenzaprine (FLEXERIL) 10 MG tablet TAKE 1 TABLET BY MOUTH 3 TIMES DAILY AS NEEDED FOR SPASMS (Patient not taking: No sig reported) 30 tablet 1 Not Taking at Unknown time  . cyclobenzaprine (FLEXERIL) 10 MG tablet TAKE 1 TABLET BY MOUTH THREE TIMES DAILY (Patient not taking:  No sig reported) 30 tablet 1 Not Taking at Unknown time  . cyclobenzaprine (FLEXERIL) 10 MG tablet TAKE 1 TABLET BY MOUTH THREE TIMES DAILY AS NEEDED FOR SPASM (Patient not taking: No sig reported) 30 tablet  0 Not Taking at Unknown time  . Galcanezumab-gnlm (EMGALITY) 120 MG/ML SOAJ Inject 120 mg into the skin every 30 (thirty) days. 1 mL 11   . losartan (COZAAR) 100 MG tablet Take 100 mg by mouth at bedtime.  (Patient not taking: Reported on 01/09/2021)   Not Taking at Unknown time  . montelukast (SINGULAIR) 10 MG tablet TAKE 1 TABLET BY MOUTH EVERY EVENING (Patient not taking: Reported on 01/09/2021) 90 tablet 2 Not Taking at Unknown time  . pantoprazole (PROTONIX) 40 MG tablet TAKE (1) TABLET BY MOUTH TWICE DAILY BEFORE A MEAL. (Patient not taking: No sig reported) 60 tablet 1 Not Taking at Unknown time   Allergies  Allergen Reactions  . Iodinated Diagnostic Agents Anaphylaxis and Hives  . Other Shortness Of Breath    Lemon grass  . Wasp Venom Anaphylaxis and Shortness Of Breath  . Pollen Extract Swelling  . Adhesive [Tape] Rash  . Hydromorphone Hcl Nausea And Vomiting  . Omnipaque [Iohexol] Hives and Nausea Only    Pt. Was premedicated with emergent protocol.    Social History   Tobacco Use  . Smoking status: Never Smoker  . Smokeless tobacco: Never Used  Substance Use Topics  . Alcohol use: No    Alcohol/week: 0.0 standard drinks    Family History  Problem Relation Age of Onset  . Hypertension Mother   . Atrial fibrillation Mother   . Arthritis Mother   . Lupus Father   . COPD Father   . Rheum arthritis Father   . Sarcoidosis Father   . Lupus Sister   . Congestive Heart Failure Maternal Grandmother   . Migraines Neg Hx   . Headache Neg Hx      Review of Systems  Constitutional: Negative for chills and fever.  Respiratory: Negative for cough and shortness of breath.   Cardiovascular: Negative for chest pain.  Gastrointestinal: Negative for nausea and vomiting.   Musculoskeletal: Positive for arthralgias.    Objective:  Physical Exam Well nourished and well developed. General: Alert and oriented x3, cooperative and pleasant, no acute distress. Head: normocephalic, atraumatic, neck supple. Eyes: EOMI.  Musculoskeletal:  Right knee exam: No palpable effusion, warmth or erythema Genu varum noted with passively correctable varus No significant flexion contracture noted Flexion to 120 degrees with tightness and mild crepitation  Calves soft and nontender. Motor function intact in LE. Strength 5/5 LE bilaterally. Neuro: Distal pulses 2+. Sensation to light touch intact in LE. Vital signs in last 24 hours:    Labs:   Estimated body mass index is 35.34 kg/m as calculated from the following:   Height as of 01/15/21: 5' 11.5" (1.816 m).   Weight as of 01/15/21: 116.6 kg.   Imaging Review Plain radiographs demonstrate severe degenerative joint disease of the right knee(s). The overall alignment isneutral. The bone quality appears to be adequate for age and reported activity level.      Assessment/Plan:  End stage arthritis, right knee   The patient history, physical examination, clinical judgment of the provider and imaging studies are consistent with end stage degenerative joint disease of the right knee(s) and total knee arthroplasty is deemed medically necessary. The treatment options including medical management, injection therapy arthroscopy and arthroplasty were discussed at length. The risks and benefits of total knee arthroplasty were presented and reviewed. The risks due to aseptic loosening, infection, stiffness, patella tracking problems, thromboembolic complications and other imponderables were discussed. The patient acknowledged the explanation, agreed to proceed with the plan  and consent was signed. Patient is being admitted for inpatient treatment for surgery, pain control, PT, OT, prophylactic antibiotics, VTE prophylaxis,  progressive ambulation and ADL's and discharge planning. The patient is planning to be discharged home.  Therapy Plans: outpatient therapy at [facility] in [place] Disposition: Home with husband Planned DVT Prophylaxis: aspirin 84m BID DME needed: none PCP: Dr. JSharilyn Sites clearance received TXA: IV Allergies: Dilaudid - nause/vomiting, headache, Iohexol - hives, bee pollen - swelling Anesthesia Concerns: none BMI: 35.5 Not diabetic.  Other: - Hx of Crohns, limit NSAIDs - Celebrex 100 BID - Oxycodone for pain, did well with left TKA    Patient's anticipated LOS is less than 2 midnights, meeting these requirements: - Younger than 661- Lives within 1 hour of care - Has a competent adult at home to recover with post-op recover - NO history of  - Chronic pain requiring opiods  - Diabetes  - Coronary Artery Disease  - Heart failure  - Heart attack  - Stroke  - DVT/VTE  - Cardiac arrhythmia  - Respiratory Failure/COPD  - Renal failure  - Anemia  - Advanced Liver disease    AGriffith Citron PA-C Orthopedic Surgery [facility] [place] ([phone]

## 2021-01-22 NOTE — Anesthesia Preprocedure Evaluation (Addendum)
Anesthesia Evaluation  Patient identified by MRN, date of birth, ID band Patient awake    Reviewed: Allergy & Precautions, NPO status , Patient's Chart, lab work & pertinent test results  History of Anesthesia Complications (+) PONVNegative for: history of anesthetic complications  Airway Mallampati: II  TM Distance: >3 FB Neck ROM: Full    Dental  (+) Teeth Intact   Pulmonary asthma , sleep apnea ,    Pulmonary exam normal        Cardiovascular hypertension, Pt. on medications Normal cardiovascular exam     Neuro/Psych  Headaches, Anxiety Idiopathic intracranial hypertension    GI/Hepatic Neg liver ROS, hiatal hernia, GERD  ,Crohn's disease   Endo/Other  negative endocrine ROS  Renal/GU negative Renal ROS  negative genitourinary   Musculoskeletal negative musculoskeletal ROS (+)   Abdominal   Peds  Hematology negative hematology ROS (+)   Anesthesia Other Findings   Reproductive/Obstetrics                            Anesthesia Physical Anesthesia Plan  ASA: II  Anesthesia Plan: Spinal   Post-op Pain Management:    Induction:   PONV Risk Score and Plan: 3 and Propofol infusion, Treatment may vary due to age or medical condition, Ondansetron and TIVA  Airway Management Planned: Nasal Cannula and Simple Face Mask  Additional Equipment: None  Intra-op Plan:   Post-operative Plan:   Informed Consent: I have reviewed the patients History and Physical, chart, labs and discussed the procedure including the risks, benefits and alternatives for the proposed anesthesia with the patient or authorized representative who has indicated his/her understanding and acceptance.       Plan Discussed with:   Anesthesia Plan Comments:        Anesthesia Quick Evaluation

## 2021-01-23 ENCOUNTER — Observation Stay (HOSPITAL_COMMUNITY)
Admission: RE | Admit: 2021-01-23 | Discharge: 2021-01-24 | Disposition: A | Discharge status: Home / Self Care | Payer: 59 | Attending: Orthopedic Surgery | Admitting: Orthopedic Surgery

## 2021-01-23 ENCOUNTER — Ambulatory Visit (HOSPITAL_COMMUNITY): Payer: 59 | Admitting: Certified Registered Nurse Anesthetist

## 2021-01-23 ENCOUNTER — Other Ambulatory Visit: Payer: Self-pay

## 2021-01-23 ENCOUNTER — Encounter (HOSPITAL_COMMUNITY): Payer: Self-pay | Admitting: Orthopedic Surgery

## 2021-01-23 ENCOUNTER — Encounter (HOSPITAL_COMMUNITY)
Admission: RE | Disposition: A | Discharge status: Home / Self Care | Payer: Self-pay | Source: Home / Self Care | Attending: Orthopedic Surgery

## 2021-01-23 DIAGNOSIS — I10 Essential (primary) hypertension: Secondary | ICD-10-CM | POA: Diagnosis not present

## 2021-01-23 DIAGNOSIS — Z79899 Other long term (current) drug therapy: Secondary | ICD-10-CM | POA: Insufficient documentation

## 2021-01-23 DIAGNOSIS — J45909 Unspecified asthma, uncomplicated: Secondary | ICD-10-CM | POA: Diagnosis not present

## 2021-01-23 DIAGNOSIS — Z7951 Long term (current) use of inhaled steroids: Secondary | ICD-10-CM | POA: Diagnosis not present

## 2021-01-23 DIAGNOSIS — G932 Benign intracranial hypertension: Secondary | ICD-10-CM | POA: Insufficient documentation

## 2021-01-23 DIAGNOSIS — G8918 Other acute postprocedural pain: Secondary | ICD-10-CM | POA: Diagnosis not present

## 2021-01-23 DIAGNOSIS — M1711 Unilateral primary osteoarthritis, right knee: Secondary | ICD-10-CM | POA: Diagnosis not present

## 2021-01-23 DIAGNOSIS — D509 Iron deficiency anemia, unspecified: Secondary | ICD-10-CM | POA: Diagnosis not present

## 2021-01-23 DIAGNOSIS — Z791 Long term (current) use of non-steroidal anti-inflammatories (NSAID): Secondary | ICD-10-CM | POA: Insufficient documentation

## 2021-01-23 DIAGNOSIS — Z96651 Presence of right artificial knee joint: Secondary | ICD-10-CM

## 2021-01-23 DIAGNOSIS — E876 Hypokalemia: Secondary | ICD-10-CM | POA: Diagnosis not present

## 2021-01-23 DIAGNOSIS — Z96652 Presence of left artificial knee joint: Secondary | ICD-10-CM | POA: Insufficient documentation

## 2021-01-23 DIAGNOSIS — K219 Gastro-esophageal reflux disease without esophagitis: Secondary | ICD-10-CM | POA: Insufficient documentation

## 2021-01-23 HISTORY — PX: TOTAL KNEE ARTHROPLASTY: SHX125

## 2021-01-23 SURGERY — ARTHROPLASTY, KNEE, TOTAL
Anesthesia: Spinal | Site: Knee | Laterality: Right

## 2021-01-23 MED ORDER — FENTANYL CITRATE (PF) 100 MCG/2ML IJ SOLN: 25.0000 ug | INTRAMUSCULAR | Status: DC | PRN

## 2021-01-23 MED ORDER — AMISULPRIDE (ANTIEMETIC) 5 MG/2ML IV SOLN: 10.0000 mg | Freq: Once | INTRAVENOUS | Status: DC | PRN

## 2021-01-23 MED ORDER — METHOCARBAMOL 1000 MG/10ML IJ SOLN
500.0000 mg | Freq: Four times a day (QID) | INTRAVENOUS | Status: DC | PRN
  Filled 2021-01-23: qty 5

## 2021-01-23 MED ORDER — BUPIVACAINE IN DEXTROSE 0.75-8.25 % IT SOLN
INTRATHECAL | Status: DC | PRN
  Administered 2021-01-23: 2 mL via INTRATHECAL

## 2021-01-23 MED ORDER — SODIUM CHLORIDE (PF) 0.9 % IJ SOLN
INTRAMUSCULAR | Status: AC
  Filled 2021-01-23: qty 30

## 2021-01-23 MED ORDER — METOCLOPRAMIDE HCL 5 MG/ML IJ SOLN: 5.0000 mg | Freq: Three times a day (TID) | INTRAMUSCULAR | Status: DC | PRN

## 2021-01-23 MED ORDER — ONDANSETRON HCL 4 MG/2ML IJ SOLN: 4.0000 mg | Freq: Four times a day (QID) | INTRAMUSCULAR | Status: DC | PRN

## 2021-01-23 MED ORDER — MENTHOL 3 MG MT LOZG: 1.0000 | LOZENGE | OROMUCOSAL | Status: DC | PRN

## 2021-01-23 MED ORDER — METOCLOPRAMIDE HCL 5 MG PO TABS: 5.0000 mg | ORAL_TABLET | Freq: Three times a day (TID) | ORAL | Status: DC | PRN

## 2021-01-23 MED ORDER — ORAL CARE MOUTH RINSE: 15.0000 mL | Freq: Once | OROMUCOSAL | Status: AC

## 2021-01-23 MED ORDER — OXYCODONE HCL 5 MG PO TABS: 5.0000 mg | ORAL_TABLET | Freq: Once | ORAL | Status: DC | PRN

## 2021-01-23 MED ORDER — CEFAZOLIN SODIUM-DEXTROSE 2-4 GM/100ML-% IV SOLN
2.0000 g | INTRAVENOUS | Status: AC
  Administered 2021-01-23: 2 g via INTRAVENOUS
  Filled 2021-01-23: qty 100

## 2021-01-23 MED ORDER — ALBUTEROL SULFATE HFA 108 (90 BASE) MCG/ACT IN AERS: 1.0000 | INHALATION_SPRAY | Freq: Four times a day (QID) | RESPIRATORY_TRACT | Status: DC | PRN

## 2021-01-23 MED ORDER — ONDANSETRON HCL 4 MG PO TABS: 4.0000 mg | ORAL_TABLET | Freq: Four times a day (QID) | ORAL | Status: DC | PRN

## 2021-01-23 MED ORDER — RIZATRIPTAN BENZOATE 10 MG PO TBDP: 10.0000 mg | ORAL_TABLET | Freq: Once | ORAL | Status: DC | PRN

## 2021-01-23 MED ORDER — SUMATRIPTAN SUCCINATE 50 MG PO TABS
100.0000 mg | ORAL_TABLET | ORAL | Status: DC | PRN
  Filled 2021-01-23: qty 2

## 2021-01-23 MED ORDER — DIPHENHYDRAMINE HCL 12.5 MG/5ML PO ELIX
12.5000 mg | ORAL_SOLUTION | ORAL | Status: DC | PRN
Start: 2021-01-23 — End: 2021-01-24

## 2021-01-23 MED ORDER — EPINEPHRINE 0.3 MG/0.3ML IJ SOAJ: 0.3000 mg | Freq: Once | INTRAMUSCULAR | Status: DC

## 2021-01-23 MED ORDER — BISACODYL 10 MG RE SUPP: 10.0000 mg | Freq: Every day | RECTAL | Status: DC | PRN

## 2021-01-23 MED ORDER — BUPIVACAINE-EPINEPHRINE (PF) 0.25% -1:200000 IJ SOLN
INTRAMUSCULAR | Status: DC | PRN
  Administered 2021-01-23: 30 mL

## 2021-01-23 MED ORDER — PROPOFOL 1000 MG/100ML IV EMUL
INTRAVENOUS | Status: AC
  Filled 2021-01-23: qty 100

## 2021-01-23 MED ORDER — PROPOFOL 10 MG/ML IV BOLUS
INTRAVENOUS | Status: DC | PRN
  Administered 2021-01-23: 30 mg via INTRAVENOUS

## 2021-01-23 MED ORDER — ALBUTEROL SULFATE (2.5 MG/3ML) 0.083% IN NEBU: 2.5000 mg | INHALATION_SOLUTION | Freq: Four times a day (QID) | RESPIRATORY_TRACT | Status: DC | PRN

## 2021-01-23 MED ORDER — MORPHINE SULFATE (PF) 2 MG/ML IV SOLN
1.0000 mg | INTRAVENOUS | Status: DC | PRN
  Administered 2021-01-23: 2 mg via INTRAVENOUS
  Filled 2021-01-23: qty 1

## 2021-01-23 MED ORDER — ONDANSETRON HCL 4 MG/2ML IJ SOLN: 4.0000 mg | Freq: Once | INTRAMUSCULAR | Status: DC | PRN

## 2021-01-23 MED ORDER — FENTANYL CITRATE (PF) 100 MCG/2ML IJ SOLN
50.0000 ug | INTRAMUSCULAR | Status: DC
  Administered 2021-01-23 (×2): 50 ug via INTRAVENOUS
  Filled 2021-01-23: qty 2

## 2021-01-23 MED ORDER — OXYCODONE HCL 5 MG/5ML PO SOLN: 5.0000 mg | Freq: Once | ORAL | Status: DC | PRN

## 2021-01-23 MED ORDER — GABAPENTIN 300 MG PO CAPS: 300.0000 mg | ORAL_CAPSULE | Freq: Every evening | ORAL | Status: DC | PRN

## 2021-01-23 MED ORDER — CELECOXIB 100 MG PO CAPS
100.0000 mg | ORAL_CAPSULE | Freq: Two times a day (BID) | ORAL | Status: DC
  Administered 2021-01-23 – 2021-01-24 (×3): 100 mg via ORAL
  Filled 2021-01-23 (×3): qty 1

## 2021-01-23 MED ORDER — METHOCARBAMOL 500 MG PO TABS
500.0000 mg | ORAL_TABLET | Freq: Four times a day (QID) | ORAL | Status: DC | PRN
  Administered 2021-01-23 – 2021-01-24 (×4): 500 mg via ORAL
  Filled 2021-01-23 (×4): qty 1

## 2021-01-23 MED ORDER — ACETAMINOPHEN 325 MG PO TABS: 325.0000 mg | ORAL_TABLET | Freq: Four times a day (QID) | ORAL | Status: DC | PRN

## 2021-01-23 MED ORDER — TRANEXAMIC ACID-NACL 1000-0.7 MG/100ML-% IV SOLN
1000.0000 mg | Freq: Once | INTRAVENOUS | Status: AC
  Administered 2021-01-23: 1000 mg via INTRAVENOUS
  Filled 2021-01-23: qty 100

## 2021-01-23 MED ORDER — OXYCODONE HCL 5 MG PO TABS
5.0000 mg | ORAL_TABLET | ORAL | Status: DC | PRN
  Administered 2021-01-24: 10 mg via ORAL
  Filled 2021-01-23 (×3): qty 2

## 2021-01-23 MED ORDER — SODIUM CHLORIDE 0.9 % IV SOLN: INTRAVENOUS | Status: DC

## 2021-01-23 MED ORDER — ONDANSETRON HCL 4 MG/2ML IJ SOLN
INTRAMUSCULAR | Status: DC | PRN
  Administered 2021-01-23: 4 mg via INTRAVENOUS

## 2021-01-23 MED ORDER — ONDANSETRON HCL 4 MG/2ML IJ SOLN
INTRAMUSCULAR | Status: AC
  Filled 2021-01-23: qty 2

## 2021-01-23 MED ORDER — LORATADINE 10 MG PO TABS
10.0000 mg | ORAL_TABLET | Freq: Every day | ORAL | Status: DC
  Administered 2021-01-23: 10 mg via ORAL
  Filled 2021-01-23: qty 1

## 2021-01-23 MED ORDER — PHENOL 1.4 % MT LIQD
1.0000 | OROMUCOSAL | Status: DC | PRN
Start: 2021-01-23 — End: 2021-01-24

## 2021-01-23 MED ORDER — PROPOFOL 500 MG/50ML IV EMUL
INTRAVENOUS | Status: DC | PRN
  Administered 2021-01-23: 100 ug/kg/min via INTRAVENOUS

## 2021-01-23 MED ORDER — DEXAMETHASONE SODIUM PHOSPHATE 10 MG/ML IJ SOLN
10.0000 mg | Freq: Once | INTRAMUSCULAR | Status: AC
  Administered 2021-01-23: 10 mg via INTRAVENOUS

## 2021-01-23 MED ORDER — KETOROLAC TROMETHAMINE 30 MG/ML IJ SOLN
INTRAMUSCULAR | Status: AC
  Filled 2021-01-23: qty 1

## 2021-01-23 MED ORDER — ASPIRIN 81 MG PO CHEW
81.0000 mg | CHEWABLE_TABLET | Freq: Two times a day (BID) | ORAL | Status: DC
  Administered 2021-01-23 – 2021-01-24 (×2): 81 mg via ORAL
  Filled 2021-01-23 (×2): qty 1

## 2021-01-23 MED ORDER — ACETAZOLAMIDE ER 500 MG PO CP12
500.0000 mg | ORAL_CAPSULE | Freq: Two times a day (BID) | ORAL | Status: DC
  Administered 2021-01-23 – 2021-01-24 (×2): 500 mg via ORAL
  Filled 2021-01-23 (×2): qty 1

## 2021-01-23 MED ORDER — CHLORHEXIDINE GLUCONATE 0.12 % MT SOLN
15.0000 mL | Freq: Once | OROMUCOSAL | Status: AC
  Administered 2021-01-23: 15 mL via OROMUCOSAL

## 2021-01-23 MED ORDER — PHENYLEPHRINE 40 MCG/ML (10ML) SYRINGE FOR IV PUSH (FOR BLOOD PRESSURE SUPPORT)
PREFILLED_SYRINGE | INTRAVENOUS | Status: DC | PRN
  Administered 2021-01-23 (×2): 80 ug via INTRAVENOUS
  Administered 2021-01-23: 160 ug via INTRAVENOUS

## 2021-01-23 MED ORDER — AMITRIPTYLINE HCL 25 MG PO TABS
25.0000 mg | ORAL_TABLET | Freq: Every day | ORAL | Status: DC
  Administered 2021-01-23: 25 mg via ORAL
  Filled 2021-01-23: qty 1

## 2021-01-23 MED ORDER — POLYETHYLENE GLYCOL 3350 17 G PO PACK: 17.0000 g | PACK | Freq: Every day | ORAL | Status: DC | PRN

## 2021-01-23 MED ORDER — POVIDONE-IODINE 10 % EX SWAB
2.0000 "application " | Freq: Once | CUTANEOUS | Status: DC
  Administered 2021-01-23: 2 via TOPICAL

## 2021-01-23 MED ORDER — KETOROLAC TROMETHAMINE 30 MG/ML IJ SOLN
INTRAMUSCULAR | Status: DC | PRN
  Administered 2021-01-23: 30 mg

## 2021-01-23 MED ORDER — PROPOFOL 10 MG/ML IV BOLUS
INTRAVENOUS | Status: AC
  Filled 2021-01-23: qty 20

## 2021-01-23 MED ORDER — SODIUM CHLORIDE (PF) 0.9 % IJ SOLN
INTRAMUSCULAR | Status: DC | PRN
Start: 2021-01-23 — End: 2021-01-23
  Administered 2021-01-23: 30 mL

## 2021-01-23 MED ORDER — PANTOPRAZOLE SODIUM 40 MG PO TBEC
40.0000 mg | DELAYED_RELEASE_TABLET | Freq: Two times a day (BID) | ORAL | Status: DC
  Administered 2021-01-23 – 2021-01-24 (×2): 40 mg via ORAL
  Filled 2021-01-23 (×2): qty 1

## 2021-01-23 MED ORDER — OXYCODONE HCL 5 MG PO TABS
10.0000 mg | ORAL_TABLET | ORAL | Status: DC | PRN
  Administered 2021-01-23: 10 mg via ORAL
  Administered 2021-01-23 – 2021-01-24 (×4): 15 mg via ORAL
  Filled 2021-01-23 (×4): qty 3

## 2021-01-23 MED ORDER — DEXAMETHASONE SODIUM PHOSPHATE 10 MG/ML IJ SOLN
INTRAMUSCULAR | Status: AC
  Filled 2021-01-23: qty 1

## 2021-01-23 MED ORDER — MECLIZINE HCL 25 MG PO TABS: 25.0000 mg | ORAL_TABLET | Freq: Three times a day (TID) | ORAL | Status: DC | PRN

## 2021-01-23 MED ORDER — VANCOMYCIN HCL 1000 MG IV SOLR
INTRAVENOUS | Status: AC
  Filled 2021-01-23: qty 1000

## 2021-01-23 MED ORDER — FAMOTIDINE 20 MG PO TABS
40.0000 mg | ORAL_TABLET | Freq: Every day | ORAL | Status: DC
  Administered 2021-01-23: 40 mg via ORAL
  Filled 2021-01-23: qty 2

## 2021-01-23 MED ORDER — DIPHENHYDRAMINE HCL 25 MG PO TABS: 25.0000 mg | ORAL_TABLET | Freq: Four times a day (QID) | ORAL | Status: DC | PRN

## 2021-01-23 MED ORDER — TRANEXAMIC ACID-NACL 1000-0.7 MG/100ML-% IV SOLN
1000.0000 mg | INTRAVENOUS | Status: AC
  Administered 2021-01-23: 1000 mg via INTRAVENOUS
  Filled 2021-01-23: qty 100

## 2021-01-23 MED ORDER — LACTATED RINGERS IV SOLN: INTRAVENOUS | Status: DC

## 2021-01-23 MED ORDER — MIDAZOLAM HCL 2 MG/2ML IJ SOLN
1.0000 mg | INTRAMUSCULAR | Status: DC
  Administered 2021-01-23: 2 mg via INTRAVENOUS
  Filled 2021-01-23: qty 2

## 2021-01-23 MED ORDER — CEFAZOLIN SODIUM-DEXTROSE 2-4 GM/100ML-% IV SOLN
2.0000 g | Freq: Four times a day (QID) | INTRAVENOUS | Status: AC
  Administered 2021-01-23 (×2): 2 g via INTRAVENOUS
  Filled 2021-01-23 (×2): qty 100

## 2021-01-23 MED ORDER — PHENYLEPHRINE HCL-NACL 10-0.9 MG/250ML-% IV SOLN
INTRAVENOUS | Status: DC | PRN
  Administered 2021-01-23: 30 ug/min via INTRAVENOUS

## 2021-01-23 MED ORDER — TOBRAMYCIN SULFATE 1.2 G IJ SOLR
INTRAMUSCULAR | Status: AC
  Filled 2021-01-23: qty 1.2

## 2021-01-23 MED ORDER — BUPIVACAINE-EPINEPHRINE (PF) 0.25% -1:200000 IJ SOLN
INTRAMUSCULAR | Status: AC
  Filled 2021-01-23: qty 30

## 2021-01-23 MED ORDER — DOCUSATE SODIUM 100 MG PO CAPS
100.0000 mg | ORAL_CAPSULE | Freq: Two times a day (BID) | ORAL | Status: DC
  Administered 2021-01-23 – 2021-01-24 (×3): 100 mg via ORAL
  Filled 2021-01-23 (×3): qty 1

## 2021-01-23 MED ORDER — FERROUS SULFATE 325 (65 FE) MG PO TABS
325.0000 mg | ORAL_TABLET | Freq: Three times a day (TID) | ORAL | Status: DC
  Administered 2021-01-23 – 2021-01-24 (×4): 325 mg via ORAL
  Filled 2021-01-23 (×4): qty 1

## 2021-01-23 MED ORDER — DEXAMETHASONE SODIUM PHOSPHATE 10 MG/ML IJ SOLN
10.0000 mg | Freq: Once | INTRAMUSCULAR | Status: AC
  Administered 2021-01-24: 10 mg via INTRAVENOUS
  Filled 2021-01-23: qty 1

## 2021-01-23 MED ORDER — ROPIVACAINE HCL 5 MG/ML IJ SOLN
INTRAMUSCULAR | Status: DC | PRN
  Administered 2021-01-23: 30 mL via PERINEURAL

## 2021-01-23 SURGICAL SUPPLY — 58 items
ADH SKN CLS APL DERMABOND .7 (GAUZE/BANDAGES/DRESSINGS) ×1
ATTUNE MED ANAT PAT 38 KNEE (Knees) ×1 IMPLANT
ATTUNE PS FEM RT SZ 5 CEM KNEE (Femur) ×1 IMPLANT
ATTUNE PSRP INSR SZ5 8 KNEE (Insert) ×1 IMPLANT
BAG SPEC THK2 15X12 ZIP CLS (MISCELLANEOUS) ×1
BAG ZIPLOCK 12X15 (MISCELLANEOUS) ×1 IMPLANT
BASE TIBIAL ROT PLAT SZ 5 KNEE (Knees) IMPLANT
BLADE SAW SGTL 11.0X1.19X90.0M (BLADE) ×1 IMPLANT
BLADE SAW SGTL 13.0X1.19X90.0M (BLADE) ×2 IMPLANT
BLADE SURG SZ10 CARB STEEL (BLADE) ×2 IMPLANT
BNDG CMPR MED 10X6 ELC LF (GAUZE/BANDAGES/DRESSINGS) ×1
BNDG ELASTIC 6X10 VLCR STRL LF (GAUZE/BANDAGES/DRESSINGS) ×1 IMPLANT
BNDG ELASTIC 6X5.8 VLCR STR LF (GAUZE/BANDAGES/DRESSINGS) ×2 IMPLANT
BOWL SMART MIX CTS (DISPOSABLE) ×2 IMPLANT
BSPLAT TIB 5 CMNT ROT PLAT STR (Knees) ×1 IMPLANT
CEMENT HV SMART SET (Cement) ×2 IMPLANT
COVER WAND RF STERILE (DRAPES) IMPLANT
CUFF TOURN SGL QUICK 34 (TOURNIQUET CUFF) ×2
CUFF TRNQT CYL 34X4.125X (TOURNIQUET CUFF) ×1 IMPLANT
DECANTER SPIKE VIAL GLASS SM (MISCELLANEOUS) ×3 IMPLANT
DERMABOND ADVANCED (GAUZE/BANDAGES/DRESSINGS) ×1
DERMABOND ADVANCED .7 DNX12 (GAUZE/BANDAGES/DRESSINGS) ×1 IMPLANT
DRAPE U-SHAPE 47X51 STRL (DRAPES) ×2 IMPLANT
DRESSING AQUACEL AG SP 3.5X10 (GAUZE/BANDAGES/DRESSINGS) ×1 IMPLANT
DRSG AQUACEL AG SP 3.5X10 (GAUZE/BANDAGES/DRESSINGS) ×2
DURAPREP 26ML APPLICATOR (WOUND CARE) ×4 IMPLANT
ELECT REM PT RETURN 15FT ADLT (MISCELLANEOUS) ×2 IMPLANT
GLOVE ORTHO TXT STRL SZ7.5 (GLOVE) ×3 IMPLANT
GLOVE SURG ENC MOIS LTX SZ6 (GLOVE) ×1 IMPLANT
GLOVE SURG UNDER LTX SZ7.5 (GLOVE) ×2 IMPLANT
GLOVE SURG UNDER POLY LF SZ6.5 (GLOVE) ×1 IMPLANT
GOWN STRL REUS W/TWL LRG LVL3 (GOWN DISPOSABLE) ×2 IMPLANT
HANDPIECE INTERPULSE COAX TIP (DISPOSABLE) ×2
HOLDER FOLEY CATH W/STRAP (MISCELLANEOUS) ×1 IMPLANT
KIT TURNOVER KIT A (KITS) ×2 IMPLANT
MANIFOLD NEPTUNE II (INSTRUMENTS) ×2 IMPLANT
NDL SAFETY ECLIPSE 18X1.5 (NEEDLE) IMPLANT
NEEDLE HYPO 18GX1.5 SHARP (NEEDLE) ×2
NS IRRIG 1000ML POUR BTL (IV SOLUTION) ×2 IMPLANT
PACK TOTAL KNEE CUSTOM (KITS) ×2 IMPLANT
PENCIL SMOKE EVACUATOR (MISCELLANEOUS) ×1 IMPLANT
PIN DRILL FIX HALF THREAD (BIT) ×1 IMPLANT
PIN FIX SIGMA LCS THRD HI (PIN) ×1 IMPLANT
PROTECTOR NERVE ULNAR (MISCELLANEOUS) ×2 IMPLANT
SET HNDPC FAN SPRY TIP SCT (DISPOSABLE) ×1 IMPLANT
SET PAD KNEE POSITIONER (MISCELLANEOUS) ×2 IMPLANT
SUT MNCRL AB 4-0 PS2 18 (SUTURE) ×2 IMPLANT
SUT STRATAFIX PDS+ 0 24IN (SUTURE) ×2 IMPLANT
SUT VIC AB 1 CT1 36 (SUTURE) ×2 IMPLANT
SUT VIC AB 2-0 CT1 27 (SUTURE) ×6
SUT VIC AB 2-0 CT1 TAPERPNT 27 (SUTURE) ×3 IMPLANT
SYR 3ML LL SCALE MARK (SYRINGE) ×2 IMPLANT
TIBIAL BASE ROT PLAT SZ 5 KNEE (Knees) ×2 IMPLANT
TRAY FOLEY MTR SLVR 14FR STAT (SET/KITS/TRAYS/PACK) ×1 IMPLANT
TRAY FOLEY MTR SLVR 16FR STAT (SET/KITS/TRAYS/PACK) ×1 IMPLANT
TUBE SUCTION HIGH CAP CLEAR NV (SUCTIONS) ×2 IMPLANT
WATER STERILE IRR 1000ML POUR (IV SOLUTION) ×4 IMPLANT
WRAP KNEE MAXI GEL POST OP (GAUZE/BANDAGES/DRESSINGS) ×2 IMPLANT

## 2021-01-23 NOTE — Op Note (Signed)
NAME:  [name] RECORD NO.:  [id number]                             FACILITY:  [facility]      PHYSICIAN:  Pietro Cassis. Alvan Dame, M.D.  DATE OF BIRTH:  [birth year]      DATE OF PROCEDURE:  01/23/2021                                     OPERATIVE REPORT         PREOPERATIVE DIAGNOSIS:  Right knee osteoarthritis.      POSTOPERATIVE DIAGNOSIS:  Right knee osteoarthritis.      FINDINGS:  The patient was noted to have complete loss of cartilage and   bone-on-bone arthritis with associated osteophytes in the medial and patellofemoral compartments of   the knee with a significant synovitis and associated effusion.  The patient had failed months of conservative treatment including medications, injection therapy, activity modification.     PROCEDURE:  Right total knee replacement.      COMPONENTS USED:  DePuy Attune rotating platform posterior stabilized knee   system, a size 5 femur, 5 tibia, size 8 mm PS AOX insert, and 38 anatomic patellar   button.      SURGEON:  Pietro Cassis. Alvan Dame, M.D.      ASSISTANT:  Griffith Citron, PA-C.      ANESTHESIA:  Regional and Spinal.      SPECIMENS:  None.      COMPLICATION:  None.      DRAINS:  None.  EBL: <100cc      TOURNIQUET TIME:   Total Tourniquet Time Documented: Thigh (Right) - 35 minutes Total: Thigh (Right) - 35 minutes      The patient was stable to the recovery room.      INDICATION FOR PROCEDURE:  [name] is a 55 y.o. female patient of   mine.  The patient had been seen, evaluated, and treated for months conservatively in the   office with medication, activity modification, and injections.  The patient had   radiographic changes of bone-on-bone arthritis with endplate sclerosis and osteophytes noted.  Based on the radiographic changes and failed conservative measures, the patient   decided to proceed with definitive treatment, total knee replacement.  Risks of infection, DVT, component failure,  need for revision surgery, neurovascular injury were reviewed in the office setting.  The postop course was reviewed stressing the efforts to maximize post-operative satisfaction and function.  Consent was obtained for benefit of pain   relief.      PROCEDURE IN DETAIL:  The patient was brought to the operative theater.   Once adequate anesthesia, preoperative antibiotics, 2 gm of Ancef,1 gm of Tranexamic Acid, and 10 mg of Decadron administered, the patient was positioned supine with a right thigh tourniquet placed.  The  right lower extremity was prepped and draped in sterile fashion.  A time-   out was performed identifying the patient, planned procedure, and the appropriate extremity.      The right lower extremity was placed in the [facility] leg holder.  The leg was   exsanguinated, tourniquet elevated to 250 mmHg.  A midline incision was   made followed by  median parapatellar arthrotomy.  Following initial   exposure, attention was first directed to the patella.  Precut   measurement was noted to be 14 mm.  I resected down to 14 mm and used a   38 anatomic patellar button to restore patellar height as well as cover the cut surface.      The lug holes were drilled and a metal shim was placed to protect the   patella from retractors and saw blade during the procedure.      At this point, attention was now directed to the femur.  The femoral   canal was opened with a drill, irrigated to try to prevent fat emboli.  An   intramedullary rod was passed at 5 degrees valgus, 9 mm of bone was   resected off the distal femur.  Following this resection, the tibia was   subluxated anteriorly.  Using the extramedullary guide, 2 mm of bone was resected off   the proximal medial tibia.  We confirmed the gap would be   stable medially and laterally with a size 6 spacer block as well as confirmed that the tibial cut was perpendicular in the coronal plane, checking with an alignment rod.      Once this was  done, I sized the femur to be a size 5 in the anterior-   posterior dimension, chose a standard component based on medial and   lateral dimension.  The size 5 rotation block was then pinned in   position anterior referenced using the C-clamp to set rotation.  The   anterior, posterior, and  chamfer cuts were made without difficulty nor   notching making certain that I was along the anterior cortex to help   with flexion gap stability.      The final box cut was made off the lateral aspect of distal femur.      At this point, the tibia was sized to be a size 5.  The size 5 tray was   then pinned in position through the medial third of the tubercle,   drilled, and keel punched.  Trial reduction was now carried with a 5 femur,  5 tibia, a size 8 mm PS insert, and the 38 anatomic patella botton.  The knee was brought to full extension with good flexion stability with the patella   tracking through the trochlea without application of pressure.  Given   all these findings the trial components removed.  Final components were   opened and cement was mixed.  The knee was irrigated with normal saline solution and pulse lavage.  The synovial lining was   then injected with 30 cc of 0.25% Marcaine with epinephrine, 1 cc of Toradol and 30 cc of NS for a total of 61 cc.     Final implants were then cemented onto cleaned and dried cut surfaces of bone with the knee brought to extension with a size 8 mm PS trial insert.      Once the cement had fully cured, excess cement was removed   throughout the knee.  I confirmed that I was satisfied with the range of   motion and stability, and the final size 8 mm PS AOX insert was chosen.  It was   placed into the knee.      The tourniquet had been let down at 35 minutes.  No significant   hemostasis was required.  The extensor mechanism was then reapproximated using #1 Vicryl and #1 Stratafix  sutures with the knee   in flexion.  The   remaining wound was closed  with 2-0 Vicryl and running 4-0 Monocryl.   The knee was cleaned, dried, dressed sterilely using Dermabond and   Aquacel dressing.  The patient was then   brought to recovery room in stable condition, tolerating the procedure   well.   Please note that Physician Assistant, Griffith Citron, PA-C was present for the entirety of the case, and was utilized for pre-operative positioning, peri-operative retractor management, general facilitation of the procedure and for primary wound closure at the end of the case.              Pietro Cassis Alvan Dame, M.D.    01/23/2021 10:10 AM

## 2021-01-23 NOTE — Plan of Care (Signed)
Plan of care discussed with pt.

## 2021-01-23 NOTE — Evaluation (Signed)
Physical Therapy Evaluation Patient Details Name: [name] MRN: [id number] DOB: 12/10/1965 Today's Date: 01/23/2021   History of Present Illness  Patient is 55 y.o. female s/p Rt TKA on 01/23/21 with PMH significant for OA, HTN, GERD, Crohn's Disease, Asthma, anxiety, Lt TKA in 2020.    Clinical Impression  [name] is a 55 y.o. female POD 0 s/p Rt TKA. Patient reports independence with mobility at baseline. Patient is now limited by functional impairments (see PT problem list below) and requires min assist for transfers and gait with RW. Patient was able to ambulate ~30 feet with RW and min assist and manual blocking at Rt knee to prevent buckling in stance phase of gait. Patient instructed in exercise to facilitate ROM and circulation to manage edema and reduce risk of DVT's. Patient will benefit from continued skilled PT interventions to address impairments and progress towards PLOF. Acute PT will follow to progress mobility and stair training in preparation for safe discharge home.     Follow Up Recommendations Follow surgeon's recommendation for DC plan and follow-up therapies    Equipment Recommendations  None recommended by PT    Recommendations for Other Services       Precautions / Restrictions Precautions Precautions: Fall Restrictions Weight Bearing Restrictions: No RLE Weight Bearing: Weight bearing as tolerated      Mobility  Bed Mobility Overal bed mobility: Needs Assistance Bed Mobility: Supine to Sit     Supine to sit: Min guard;HOB elevated     General bed mobility comments: pt using bed rail and taking extra time to pivot and bring LE's off EOB.    Transfers Overall transfer level: Needs assistance Equipment used: Rolling walker (2 wheeled) Transfers: Sit to/from Stand Sit to Stand: Min assist         General transfer comment: cues for hand placement for power up, no overt LOB noted.  Ambulation/Gait Ambulation/Gait assistance: Min  assist Gait Distance (Feet): 30 Feet Assistive device: Rolling walker (2 wheeled) Gait Pattern/deviations: Step-to pattern;Decreased stride length Gait velocity: decr   General Gait Details: cues for step pattern with RW and safe positiong. manual blocking/assit to facilitate Rt knee extension and prevent bucklign in stance. pt improved use of UE's throughout gait.  Stairs            Wheelchair Mobility    Modified Rankin (Stroke Patients Only)       Balance Overall balance assessment: Needs assistance Sitting-balance support: Feet supported Sitting balance-Leahy Scale: Good     Standing balance support: During functional activity;Bilateral upper extremity supported Standing balance-Leahy Scale: Poor                               Pertinent Vitals/Pain Pain Assessment: 0-10 Pain Score: 5  Pain Location: Rt knee Pain Descriptors / Indicators: Aching Pain Intervention(s): Limited activity within patient's tolerance;Monitored during session;Repositioned;Ice applied;Premedicated before session    Home Living Family/patient expects to be discharged to:: Private residence Living Arrangements: Spouse/significant other Available Help at Discharge: Family Type of Home: House Home Access: Level entry     [place]: Two level;Full bath on main level;Able to live on main level with bedroom/bathroom Home Equipment: Toilet riser;Walker - 2 wheels;Bedside commode;Cane - single point Additional Comments: pt's husband is available to assist whiel pt recovers.    Prior Function Level of Independence: Independent               Hand  Dominance   Dominant Hand: Right    Extremity/Trunk Assessment   Upper Extremity Assessment Upper Extremity Assessment: Overall WFL for tasks assessed    Lower Extremity Assessment Lower Extremity Assessment: RLE deficits/detail RLE Deficits / Details: good quad activation, no extensor lag with SLR, slight buckling in  standing RLE Sensation: WNL RLE Coordination: WNL    Cervical / Trunk Assessment Cervical / Trunk Assessment: Normal  Communication   Communication: No difficulties  Cognition Arousal/Alertness: Awake/alert Behavior During Therapy: WFL for tasks assessed/performed Overall Cognitive Status: Within Functional Limits for tasks assessed                                        General Comments      Exercises Total Joint Exercises Ankle Circles/Pumps: AROM;Both;20 reps;Seated Quad Sets: AROM;Right;5 reps;Seated Heel Slides: AROM;Right;5 reps;Seated   Assessment/Plan    PT Assessment Patient needs continued PT services  PT Problem List Decreased strength;Decreased range of motion;Decreased activity tolerance;Decreased balance;Decreased mobility;Decreased knowledge of use of DME;Decreased knowledge of precautions;Pain       PT Treatment Interventions DME instruction;Gait training;Stair training;Functional mobility training;Therapeutic activities;Therapeutic exercise;Balance training;Patient/family education    PT Goals (Current goals can be found in the Care Plan section)  Acute Rehab PT Goals Patient Stated Goal: regain independence and knee to regain ROM PT Goal Formulation: With patient Time For Goal Achievement: 01/30/21 Potential to Achieve Goals: Good    Frequency 7X/week   Barriers to discharge        Co-evaluation               AM-PAC PT "6 Clicks" Mobility  Outcome Measure Help needed turning from your back to your side while in a flat bed without using bedrails?: A Little Help needed moving from lying on your back to sitting on the side of a flat bed without using bedrails?: A Little Help needed moving to and from a bed to a chair (including a wheelchair)?: A Little Help needed standing up from a chair using your arms (e.g., wheelchair or bedside chair)?: A Little Help needed to walk in hospital room?: A Little Help needed climbing 3-5  steps with a railing? : A Little 6 Click Score: 18    End of Session Equipment Utilized During Treatment: Gait belt Activity Tolerance: Patient tolerated treatment well Patient left: in chair;with call bell/phone within reach;with chair alarm set;with family/visitor present Nurse Communication: Mobility status PT Visit Diagnosis: Muscle weakness (generalized) (M62.81);Difficulty in walking, not elsewhere classified (R26.2)    Time: 9179-1505 PT Time Calculation (min) (ACUTE ONLY): 24 min   Charges:   PT Evaluation $PT Eval Low Complexity: 1 Low PT Treatments $Gait Training: 8-22 mins        Verner Mould, DPT Acute Rehabilitation Services Office [phone] Pager [phone]    Jacques Navy 01/23/2021, 4:51 PM

## 2021-01-23 NOTE — Anesthesia Procedure Notes (Signed)
Procedure Name: MAC Date/Time: 01/23/2021 8:41 AM Performed by: West Pugh, CRNA Pre-anesthesia Checklist: Patient identified, Emergency Drugs available, Suction available, Patient being monitored and Timeout performed Patient Re-evaluated:Patient Re-evaluated prior to induction Oxygen Delivery Method: Simple face mask Preoxygenation: Pre-oxygenation with 100% oxygen Induction Type: IV induction Placement Confirmation: positive ETCO2 Dental Injury: Teeth and Oropharynx as per pre-operative assessment

## 2021-01-23 NOTE — Anesthesia Postprocedure Evaluation (Signed)
Anesthesia Post Note  Patient: [name]  Procedure(s) Performed: TOTAL KNEE ARTHROPLASTY (Right Knee)     Patient location during evaluation: PACU Anesthesia Type: Spinal Level of consciousness: awake and alert Pain management: pain level controlled Vital Signs Assessment: post-procedure vital signs reviewed and stable Respiratory status: spontaneous breathing, nonlabored ventilation and respiratory function stable Cardiovascular status: blood pressure returned to baseline and stable Postop Assessment: no apparent nausea or vomiting Anesthetic complications: no   No complications documented.  Last Vitals:  Vitals:   01/23/21 1130 01/23/21 1145  BP: 118/86 119/88  Pulse: 83 80  Resp: 11 12  Temp: 36.5 C   SpO2: 98% 98%    Last Pain:  Vitals:   01/23/21 1145  TempSrc:   PainSc: Asleep                 Lidia Collum

## 2021-01-23 NOTE — Discharge Instructions (Signed)
INSTRUCTIONS AFTER JOINT REPLACEMENT   o Remove items at home which could result in a fall. This includes throw rugs or furniture in walking pathways o ICE to the affected joint every three hours while awake for 30 minutes at a time, for at least the first 3-5 days, and then as needed for pain and swelling.  Continue to use ice for pain and swelling. You may notice swelling that will progress down to the foot and ankle.  This is normal after surgery.  Elevate your leg when you are not up walking on it.   o Continue to use the breathing machine you got in the hospital (incentive spirometer) which will help keep your temperature down.  It is common for your temperature to cycle up and down following surgery, especially at night when you are not up moving around and exerting yourself.  The breathing machine keeps your lungs expanded and your temperature down.   DIET:  As you were doing prior to hospitalization, we recommend a well-balanced diet.  DRESSING / WOUND CARE / SHOWERING  Keep the surgical dressing until follow up.  The dressing is water proof, so you can shower without any extra covering.  IF THE DRESSING FALLS OFF or the wound gets wet inside, change the dressing with sterile gauze.  Please use good hand washing techniques before changing the dressing.  Do not use any lotions or creams on the incision until instructed by your surgeon.    ACTIVITY  o Increase activity slowly as tolerated, but follow the weight bearing instructions below.   o No driving for 6 weeks or until further direction given by your physician.  You cannot drive while taking narcotics.  o No lifting or carrying greater than 10 lbs. until further directed by your surgeon. o Avoid periods of inactivity such as sitting longer than an hour when not asleep. This helps prevent blood clots.  o You may return to work once you are authorized by your doctor.     WEIGHT BEARING   Weight bearing as tolerated with assist  device (walker, cane, etc) as directed, use it as long as suggested by your surgeon or therapist, typically at least 4-6 weeks.   EXERCISES  Results after joint replacement surgery are often greatly improved when you follow the exercise, range of motion and muscle strengthening exercises prescribed by your doctor. Safety measures are also important to protect the joint from further injury. Any time any of these exercises cause you to have increased pain or swelling, decrease what you are doing until you are comfortable again and then slowly increase them. If you have problems or questions, call your caregiver or physical therapist for advice.   Rehabilitation is important following a joint replacement. After just a few days of immobilization, the muscles of the leg can become weakened and shrink (atrophy).  These exercises are designed to build up the tone and strength of the thigh and leg muscles and to improve motion. Often times heat used for twenty to thirty minutes before working out will loosen up your tissues and help with improving the range of motion but do not use heat for the first two weeks following surgery (sometimes heat can increase post-operative swelling).   These exercises can be done on a training (exercise) mat, on the floor, on a table or on a bed. Use whatever works the best and is most comfortable for you.    Use music or television while you are exercising so that  the exercises are a pleasant break in your day. This will make your life better with the exercises acting as a break in your routine that you can look forward to.   Perform all exercises about fifteen times, three times per day or as directed.  You should exercise both the operative leg and the other leg as well.  Exercises include:   . Quad Sets - Tighten up the muscle on the front of the thigh (Quad) and hold for 5-10 seconds.   . Straight Leg Raises - With your knee straight (if you were given a brace, keep it on),  lift the leg to 60 degrees, hold for 3 seconds, and slowly lower the leg.  Perform this exercise against resistance later as your leg gets stronger.  . Leg Slides: Lying on your back, slowly slide your foot toward your buttocks, bending your knee up off the floor (only go as far as is comfortable). Then slowly slide your foot back down until your leg is flat on the floor again.  [place] Haring Wings: Lying on your back spread your legs to the side as far apart as you can without causing discomfort.  . Hamstring Strength:  Lying on your back, push your heel against the floor with your leg straight by tightening up the muscles of your buttocks.  Repeat, but this time bend your knee to a comfortable angle, and push your heel against the floor.  You may put a pillow under the heel to make it more comfortable if necessary.   A rehabilitation program following joint replacement surgery can speed recovery and prevent re-injury in the future due to weakened muscles. Contact your doctor or a physical therapist for more information on knee rehabilitation.    CONSTIPATION  Constipation is defined medically as fewer than three stools per week and severe constipation as less than one stool per week.  Even if you have a regular bowel pattern at home, your normal regimen is likely to be disrupted due to multiple reasons following surgery.  Combination of anesthesia, postoperative narcotics, change in appetite and fluid intake all can affect your bowels.   YOU MUST use at least one of the following options; they are listed in order of increasing strength to get the job done.  They are all available over the counter, and you may need to use some, POSSIBLY even all of these options:    Drink plenty of fluids (prune juice may be helpful) and high fiber foods Colace 100 mg by mouth twice a day  Senokot for constipation as directed and as needed Dulcolax (bisacodyl), take with full glass of water  Miralax (polyethylene glycol)  once or twice a day as needed.  If you have tried all these things and are unable to have a bowel movement in the first 3-4 days after surgery call either your surgeon or your primary doctor.    If you experience loose stools or diarrhea, hold the medications until you stool forms back up.  If your symptoms do not get better within 1 week or if they get worse, check with your doctor.  If you experience "the worst abdominal pain ever" or develop nausea or vomiting, please contact the office immediately for further recommendations for treatment.   ITCHING:  If you experience itching with your medications, try taking only a single pain pill, or even half a pain pill at a time.  You can also use Benadryl over the counter for itching or also to  help with sleep.   TED HOSE STOCKINGS:  Use stockings on both legs until for at least 2 weeks or as directed by physician office. They may be removed at night for sleeping.  MEDICATIONS:  See your medication summary on the "After Visit Summary" that nursing will review with you.  You may have some home medications which will be placed on hold until you complete the course of blood thinner medication.  It is important for you to complete the blood thinner medication as prescribed.  PRECAUTIONS:  If you experience chest pain or shortness of breath - call 911 immediately for transfer to the hospital emergency department.   If you develop a fever greater that 101 F, purulent drainage from wound, increased redness or drainage from wound, foul odor from the wound/dressing, or calf pain - CONTACT YOUR SURGEON.                                                   FOLLOW-UP APPOINTMENTS:  If you do not already have a post-op appointment, please call the office for an appointment to be seen by your surgeon.  Guidelines for how soon to be seen are listed in your "After Visit Summary", but are typically between 1-4 weeks after surgery.  OTHER INSTRUCTIONS:   Knee  Replacement:  Do not place pillow under knee, focus on keeping the knee straight while resting. CPM instructions: 0-90 degrees, 2 hours in the morning, 2 hours in the afternoon, and 2 hours in the evening. Place foam block, curve side up under heel at all times except when in CPM or when walking.  DO NOT modify, tear, cut, or change the foam block in any way.  POST-OPERATIVE OPIOID TAPER INSTRUCTIONS: . It is important to wean off of your opioid medication as soon as possible. If you do not need pain medication after your surgery it is ok to stop day one. Marland Kitchen Opioids include: o Codeine, Hydrocodone(Norco, Vicodin), Oxycodone(Percocet, oxycontin) and hydromorphone amongst others.  . Long term and even short term use of opiods can cause: o Increased pain response o Dependence o Constipation o Depression o Respiratory depression o And more.  . Withdrawal symptoms can include o Flu like symptoms o Nausea, vomiting o And more . Techniques to manage these symptoms o Hydrate well o Eat regular healthy meals o Stay active o Use relaxation techniques(deep breathing, meditating, yoga) . Do Not substitute Alcohol to help with tapering . If you have been on opioids for less than two weeks and do not have pain than it is ok to stop all together.  . Plan to wean off of opioids o This plan should start within one week post op of your joint replacement. o Maintain the same interval or time between taking each dose and first decrease the dose.  o Cut the total daily intake of opioids by one tablet each day o Next start to increase the time between doses. o The last dose that should be eliminated is the evening dose.     MAKE SURE YOU:  . Understand these instructions.  . Get help right away if you are not doing well or get worse.    Thank you for letting us be a part of your medical care team.  It is a privilege we respect greatly.  We hope these instructions will  help you stay on track for a fast  and full recovery!

## 2021-01-23 NOTE — Anesthesia Procedure Notes (Addendum)
Spinal  Patient location during procedure: OR Start time: 01/23/2021 8:41 AM End time: 01/23/2021 8:49 AM Reason for block: surgical anesthesia Staffing Performed: resident/CRNA  Anesthesiologist: Lidia Collum, MD Resident/CRNA: West Pugh, CRNA Preanesthetic Checklist Completed: patient identified, IV checked, site marked, risks and benefits discussed, surgical consent, monitors and equipment checked, pre-op evaluation and timeout performed Spinal Block Patient position: sitting Prep: ChloraPrep and site prepped and draped Patient monitoring: heart rate, continuous pulse ox and blood pressure Approach: midline Location: L3-4 Injection technique: single-shot Needle Needle type: Pencan and Introducer  Needle gauge: 24 G Needle length: 10 cm Assessment Sensory level: T6 Events: CSF return Additional Notes IV functioning, monitors applied to pt. Expiration date of kit checked and confirmed to be in date. Sterile prep and drape, hand hygiene and sterile gloved used. Pt was positioned and spine was prepped in sterile fashion. Skin was anesthetized with lidocaine. Free flow of clear CSF obtained prior to injecting local anesthetic into CSF. Spinal needle aspirated freely following injection. Needle was carefully withdrawn, and pt tolerated procedure well. Loss of motor and sensory on exam post injection. Dr Christella Hartigan present for procedure.

## 2021-01-23 NOTE — Interval H&P Note (Signed)
History and Physical Interval Note:  01/23/2021 6:59 AM  [name]  has presented today for surgery, with the diagnosis of Right knee osteoarthritis.  The various methods of treatment have been discussed with the patient and family. After consideration of risks, benefits and other options for treatment, the patient has consented to  Procedure(s) with comments: TOTAL KNEE ARTHROPLASTY (Right) - 70 mins as a surgical intervention.  The patient's history has been reviewed, patient examined, no change in status, stable for surgery.  I have reviewed the patient's chart and labs.  Questions were answered to the patient's satisfaction.     [name]

## 2021-01-23 NOTE — Anesthesia Procedure Notes (Signed)
Anesthesia Regional Block: Adductor canal block   Pre-Anesthetic Checklist: ,, timeout performed, Correct Patient, Correct Site, Correct Laterality, Correct Procedure, Correct Position, site marked, Risks and benefits discussed,  Surgical consent,  Pre-op evaluation,  At surgeon's request and post-op pain management  Laterality: Right  Prep: chloraprep       Needles:  Injection technique: Single-shot  Needle Type: Echogenic Stimulator Needle     Needle Length: 10cm  Needle Gauge: 20     Additional Needles:   Procedures:,,,, ultrasound used (permanent image in chart),,,,  Narrative:  Start time: 01/23/2021 8:07 AM End time: 01/23/2021 8:12 AM Injection made incrementally with aspirations every 5 mL.  Performed by: Personally  Anesthesiologist: Lidia Collum, MD  Additional Notes: Standard monitors applied. Skin prepped. Good needle visualization with ultrasound. Injection made in 5cc increments with no resistance to injection. Patient tolerated the procedure well.

## 2021-01-23 NOTE — Transfer of Care (Signed)
Immediate Anesthesia Transfer of Care Note  Patient: [name]  Procedure(s) Performed: TOTAL KNEE ARTHROPLASTY (Right Knee)  Patient Location: PACU  Anesthesia Type:Spinal and MAC combined with regional for post-op pain  Level of Consciousness: awake, oriented and patient cooperative  Airway & Oxygen Therapy: Patient Spontanous Breathing and Patient connected to face mask oxygen  Post-op Assessment: Report given to RN and Post -op Vital signs reviewed and stable  Post vital signs: Reviewed and stable  Last Vitals:  Vitals Value Taken Time  BP 106/70 01/23/21 1036  Temp    Pulse 87 01/23/21 1037  Resp 16 01/23/21 1037  SpO2 99 % 01/23/21 1037  Vitals shown include unvalidated device data.  Last Pain:  Vitals:   01/23/21 0648  TempSrc:   PainSc: 4       Patients Stated Pain Goal: 2 (70/78/67 5449)  Complications: No complications documented.

## 2021-01-24 ENCOUNTER — Other Ambulatory Visit (HOSPITAL_COMMUNITY): Payer: Self-pay

## 2021-01-24 ENCOUNTER — Encounter (HOSPITAL_COMMUNITY): Payer: Self-pay | Admitting: Orthopedic Surgery

## 2021-01-24 DIAGNOSIS — M1711 Unilateral primary osteoarthritis, right knee: Secondary | ICD-10-CM | POA: Diagnosis not present

## 2021-01-24 DIAGNOSIS — D509 Iron deficiency anemia, unspecified: Secondary | ICD-10-CM | POA: Diagnosis not present

## 2021-01-24 DIAGNOSIS — J45909 Unspecified asthma, uncomplicated: Secondary | ICD-10-CM | POA: Diagnosis not present

## 2021-01-24 DIAGNOSIS — G932 Benign intracranial hypertension: Secondary | ICD-10-CM | POA: Diagnosis not present

## 2021-01-24 DIAGNOSIS — K219 Gastro-esophageal reflux disease without esophagitis: Secondary | ICD-10-CM | POA: Diagnosis not present

## 2021-01-24 DIAGNOSIS — Z96652 Presence of left artificial knee joint: Secondary | ICD-10-CM | POA: Diagnosis not present

## 2021-01-24 DIAGNOSIS — Z79899 Other long term (current) drug therapy: Secondary | ICD-10-CM | POA: Diagnosis not present

## 2021-01-24 DIAGNOSIS — I10 Essential (primary) hypertension: Secondary | ICD-10-CM | POA: Diagnosis not present

## 2021-01-24 LAB — BASIC METABOLIC PANEL
Anion gap: 6 (ref 5–15)
BUN: 16 mg/dL (ref 6–20)
CO2: 21 mmol/L — ABNORMAL LOW (ref 22–32)
Calcium: 8.6 mg/dL — ABNORMAL LOW (ref 8.9–10.3)
Chloride: 111 mmol/L (ref 98–111)
Creatinine, Ser: 0.84 mg/dL (ref 0.44–1.00)
GFR, Estimated: >60 mL/min (ref >60)
Glucose, Bld: 139 mg/dL — ABNORMAL HIGH (ref 70–99)
Potassium: 3.8 mmol/L (ref 3.5–5.1)
Sodium: 138 mmol/L (ref 135–145)

## 2021-01-24 LAB — CBC
HCT: 35.3 % — ABNORMAL LOW (ref 36.0–46.0)
Hemoglobin: 11.3 g/dL — ABNORMAL LOW (ref 12.0–15.0)
MCH: 29.1 pg (ref 26.0–34.0)
MCHC: 32 g/dL (ref 30.0–36.0)
MCV: 91 fL (ref 80.0–100.0)
Platelets: 327 10*3/uL (ref 150–400)
RBC: 3.88 MIL/uL (ref 3.87–5.11)
RDW: 14.4 % (ref 11.5–15.5)
WBC: 8.3 10*3/uL (ref 4.0–10.5)
nRBC: 0 % (ref 0.0–0.2)

## 2021-01-24 MED ORDER — OXYCODONE HCL 5 MG PO TABS
5.0000 mg | ORAL_TABLET | ORAL | 0 refills | Status: DC | PRN
  Filled 2021-01-24: qty 56, 5d supply, fill #0

## 2021-01-24 MED ORDER — ASPIRIN 81 MG PO CHEW
81.0000 mg | CHEWABLE_TABLET | Freq: Two times a day (BID) | ORAL | 0 refills | Status: AC
  Filled 2021-01-24: qty 56, 28d supply, fill #0

## 2021-01-24 MED ORDER — METHOCARBAMOL 500 MG PO TABS
500.0000 mg | ORAL_TABLET | Freq: Four times a day (QID) | ORAL | 0 refills | Status: DC | PRN
  Filled 2021-01-24: qty 40, 10d supply, fill #0

## 2021-01-24 MED ORDER — CELECOXIB 100 MG PO CAPS
100.0000 mg | ORAL_CAPSULE | Freq: Two times a day (BID) | ORAL | 0 refills | Status: DC
  Filled 2021-01-24: qty 60, 30d supply, fill #0

## 2021-01-24 MED ORDER — DOCUSATE SODIUM 100 MG PO CAPS
100.0000 mg | ORAL_CAPSULE | Freq: Two times a day (BID) | ORAL | 0 refills | Status: DC
Start: 2021-01-24 — End: 2022-03-11
  Filled 2021-01-24: qty 100, 50d supply, fill #0

## 2021-01-24 NOTE — Progress Notes (Signed)
Patient ID: [name], female   DOB: 1966-04-09, 55 y.o.   MRN: [id number] Subjective: 1 Day Post-Op Procedure(s) (LRB): TOTAL KNEE ARTHROPLASTY (Right)    Patient reports pain as mild to moderate. No events reported Ready to get things going and get back to normal  Objective:   VITALS:   Vitals:   01/24/21 0158 01/24/21 0610  BP: 114/77 113/64  Pulse: 76 79  Resp: 16 16  Temp: 97.7 F (36.5 C) (!) 97.5 F (36.4 C)  SpO2: 98% 99%    Neurovascular intact Incision: dressing C/D/I  LABS Recent Labs    01/24/21 0314  HGB 11.3*  HCT 35.3*  WBC 8.3  PLT 327    Recent Labs    01/24/21 0314  NA 138  K 3.8  BUN 16  CREATININE 0.84  GLUCOSE 139*    No results for input(s): LABPT, INR in the last 72 hours.   Assessment/Plan: 1 Day Post-Op Procedure(s) (LRB): TOTAL KNEE ARTHROPLASTY (Right)   Advance diet Up with therapy  Home likely this am after therapy RTC in 2 weeks

## 2021-01-24 NOTE — TOC Transition Note (Signed)
Transition of Care [facility]) - CM/SW Discharge Note   Patient Details  Name: [name] MRN: [id number] Date of Birth: 1965/10/25  [facility] CM/SW Contact:  Lennart Pall, LCSW Phone Number: 01/24/2021, 2:03 PM   Clinical Narrative:    Met briefly with pt and confirming she has all needed DME at home.  Plan for OPPT at [facility] in [place].  No further TOC needs.   Final next level of care: OP Rehab Barriers to Discharge: No Barriers Identified   Patient Goals and CMS Choice Patient states their goals for this hospitalization and ongoing recovery are:: return home      Discharge Placement                       Discharge Plan and Services                DME Arranged: N/A DME Agency: NA                  Social Determinants of Health (SDOH) Interventions     Readmission Risk Interventions No flowsheet data found.

## 2021-01-24 NOTE — Progress Notes (Signed)
Physical Therapy Treatment Patient Details Name: [name] MRN: [id number] DOB: 05-15-1966 Today's Date: 01/24/2021    History of Present Illness Patient is 55 y.o. female s/p Rt TKA on 01/23/21 with PMH significant for OA, HTN, GERD, Crohn's Disease, Asthma, anxiety, Lt TKA in 2020.    PT Comments    Pt very motivated and progressing well with mobility.  Pt ambulated increased distance in hall with noted improvement in knee stability and performed HEP with written instruction provided and reviewed.  Pt eager for dc home and reports first OP PT scheduled for MOnday 11/29/20  Follow Up Recommendations  Follow surgeon's recommendation for DC plan and follow-up therapies     Equipment Recommendations  None recommended by PT    Recommendations for Other Services       Precautions / Restrictions Precautions Precautions: Fall Restrictions Weight Bearing Restrictions: No RLE Weight Bearing: Weight bearing as tolerated    Mobility  Bed Mobility Overal bed mobility: Modified Independent Bed Mobility: Supine to Sit           General bed mobility comments: No physical assist required    Transfers Overall transfer level: Needs assistance Equipment used: Rolling walker (2 wheeled) Transfers: Sit to/from Stand Sit to Stand: Min guard;Supervision         General transfer comment: cues for use of hands to push to standing  Ambulation/Gait Ambulation/Gait assistance: Min guard;Supervision Gait Distance (Feet): 150 Feet Assistive device: Rolling walker (2 wheeled) Gait Pattern/deviations: Step-to pattern;Decreased stride length;Shuffle Gait velocity: decr   General Gait Details: cues for posture, sequence and position from [employer] Mobility    Modified Rankin (Stroke Patients Only)       Balance Overall balance assessment: Needs assistance Sitting-balance support: Feet supported Sitting balance-Leahy Scale: Good      Standing balance support: No upper extremity supported Standing balance-Leahy Scale: Fair                              Cognition Arousal/Alertness: Awake/alert Behavior During Therapy: WFL for tasks assessed/performed Overall Cognitive Status: Within Functional Limits for tasks assessed                                        Exercises Total Joint Exercises Ankle Circles/Pumps: AROM;Both;20 reps;Seated Quad Sets: AROM;Right;10 reps;Supine Heel Slides: AROM;Right;15 reps;Supine Straight Leg Raises: AAROM;AROM;Right;15 reps;Supine Long Arc Quad: AAROM;AROM;Right;10 reps;Seated Knee Flexion: AROM;AAROM;10 reps;Seated Goniometric ROM: -5 - 70    General Comments        Pertinent Vitals/Pain Pain Assessment: 0-10 Pain Score: 4  Pain Location: Rt knee Pain Descriptors / Indicators: Aching;Sore Pain Intervention(s): Limited activity within patient's tolerance;Monitored during session;Premedicated before session;Ice applied    Home Living                      Prior Function            PT Goals (current goals can now be found in the care plan section) Acute Rehab PT Goals Patient Stated Goal: regain independence and knee to regain ROM PT Goal Formulation: With patient Time For Goal Achievement: 01/30/21 Potential to Achieve Goals: Good Progress towards PT goals: Progressing toward goals    Frequency    7X/week  PT Plan Current plan remains appropriate    Co-evaluation              AM-PAC PT "6 Clicks" Mobility   Outcome Measure  Help needed turning from your back to your side while in a flat bed without using bedrails?: A Little Help needed moving from lying on your back to sitting on the side of a flat bed without using bedrails?: A Little Help needed moving to and from a bed to a chair (including a wheelchair)?: A Little Help needed standing up from a chair using your arms (e.g., wheelchair or bedside chair)?: A  Little Help needed to walk in hospital room?: A Little Help needed climbing 3-5 steps with a railing? : A Little 6 Click Score: 18    End of Session Equipment Utilized During Treatment: Gait belt Activity Tolerance: Patient tolerated treatment well Patient left: in chair;with call bell/phone within reach;with chair alarm set;with family/visitor present Nurse Communication: Mobility status PT Visit Diagnosis: Difficulty in walking, not elsewhere classified (R26.2)     Time: 0821-0910 PT Time Calculation (min) (ACUTE ONLY): 49 min  Charges:  $Gait Training: 8-22 mins $Therapeutic Exercise: 23-37 mins                     [place] Pager [phone] Office [phone]    Doni Widmer 01/24/2021, 9:23 AM

## 2021-01-24 NOTE — Plan of Care (Signed)
Plan of care reviewed and discussed with the patient. 

## 2021-01-27 ENCOUNTER — Other Ambulatory Visit: Payer: Self-pay

## 2021-01-27 ENCOUNTER — Encounter (HOSPITAL_COMMUNITY): Payer: Self-pay | Admitting: Physical Therapy

## 2021-01-27 ENCOUNTER — Ambulatory Visit (HOSPITAL_COMMUNITY): Payer: 59 | Attending: Student | Admitting: Physical Therapy

## 2021-01-27 DIAGNOSIS — M25561 Pain in right knee: Secondary | ICD-10-CM | POA: Insufficient documentation

## 2021-01-27 DIAGNOSIS — R262 Difficulty in walking, not elsewhere classified: Secondary | ICD-10-CM | POA: Diagnosis not present

## 2021-01-27 DIAGNOSIS — M6281 Muscle weakness (generalized): Secondary | ICD-10-CM | POA: Diagnosis not present

## 2021-01-27 NOTE — Discharge Summary (Signed)
Physician Discharge Summary   Patient ID: [name] MRN: [id number] DOB/AGE: 53-Oct-1967 53 y.o.  Admit date: 01/23/2021 Discharge date: 01/24/2021  Primary Diagnosis: Right knee osteoarthritis.  Admission Diagnoses:  Past Medical History:  Diagnosis Date  . Anxiety   . Arthritis   . Asthma   . Cervical hyperplasia    hisotry of  . Chest pain    cath 2005 norm cors, repeat cath Feb 2011 normal cors and only minimally  elevated pulmonary pressures  . Crohn disease ([place])   . Diverticulitis of colon 07/17/2014  . Diverticulosis   . Dyspnea   . GERD (gastroesophageal reflux disease)   . Hiatal hernia   . History of iron deficiency anemia   . Hypertension   . Idiopathic intracranial hypertension    Per patient  . Migraines   . Obesity   . Palpitations   . Pneumonia 2008   14 days hospitalization, bilateral  . PONV (postoperative nausea and vomiting)   . Sleep apnea sleep study 11/03/2009    cpap; does sometimes, doesnt use every night. weight loss no longer using CPAP  . Vitamin B deficiency    Discharge Diagnoses:   Active Problems:   S/P total knee arthroplasty, right  Estimated body mass index is 35.85 kg/m as calculated from the following:   Height as of this encounter: 5' 11"  (1.803 m).   Weight as of this encounter: 116.6 kg.  Procedure:  Procedure(s) (LRB): TOTAL KNEE ARTHROPLASTY (Right)   Consults: None  HPI:  [name] is a 55 y.o. female patient of   mine.  The patient had been seen, evaluated, and treated for months conservatively in the   office with medication, activity modification, and injections.  The patient had   radiographic changes of bone-on-bone arthritis with endplate sclerosis and osteophytes noted.  Based on the radiographic changes and failed conservative measures, the patient   decided to proceed with definitive treatment, total knee replacement.  Risks of infection, DVT, component failure, need for revision surgery,  neurovascular injury were reviewed in the office setting.  The postop course was reviewed stressing the efforts to maximize post-operative satisfaction and function.  Consent was obtained for benefit of pain   relief.       Laboratory Data: Admission on 01/23/2021, Discharged on 01/24/2021  Component Date Value Ref Range Status  . WBC 01/24/2021 8.3  4.0 - 10.5 K/uL Final  . RBC 01/24/2021 3.88  3.87 - 5.11 MIL/uL Final  . Hemoglobin 01/24/2021 11.3* 12.0 - 15.0 g/dL Final  . HCT 01/24/2021 35.3* 36.0 - 46.0 % Final  . MCV 01/24/2021 91.0  80.0 - 100.0 fL Final  . MCH 01/24/2021 29.1  26.0 - 34.0 pg Final  . MCHC 01/24/2021 32.0  30.0 - 36.0 g/dL Final  . RDW 01/24/2021 14.4  11.5 - 15.5 % Final  . Platelets 01/24/2021 327  150 - 400 K/uL Final  . nRBC 01/24/2021 0.0  0.0 - 0.2 % Final   Performed at [facility], [place] [address], [place], [place] [address]  . Sodium 01/24/2021 138  135 - 145 mmol/L Final  . Potassium 01/24/2021 3.8  3.5 - 5.1 mmol/L Final  . Chloride 01/24/2021 111  98 - 111 mmol/L Final  . CO2 01/24/2021 21* 22 - 32 mmol/L Final  . Glucose, Bld 01/24/2021 139* 70 - 99 mg/dL Final   Glucose reference range applies only to samples taken after fasting for at least 8 hours.  Marland Kitchen  BUN 01/24/2021 16  6 - 20 mg/dL Final  . Creatinine, Ser 01/24/2021 0.84  0.44 - 1.00 mg/dL Final  . Calcium 01/24/2021 8.6* 8.9 - 10.3 mg/dL Final  . GFR, Estimated 01/24/2021 >60  >60 mL/min Final   Comment: (NOTE) Calculated using the CKD-EPI Creatinine Equation (2021)   . Anion gap 01/24/2021 6  5 - 15 Final   Performed at [facility], [place] [address], [place], [place] [address]  Hospital Outpatient Visit on 01/21/2021  Component Date Value Ref Range Status  . SARS Coronavirus 2 01/21/2021 NEGATIVE  NEGATIVE Final   Comment: (NOTE) SARS-CoV-2 target nucleic acids are NOT DETECTED.  The SARS-CoV-2 RNA is generally detectable in upper and  lower respiratory specimens during the acute phase of infection. Negative results do not preclude SARS-CoV-2 infection, do not rule out co-infections with other pathogens, and should not be used as the sole basis for treatment or other patient management decisions. Negative results must be combined with clinical observations, patient history, and epidemiological information. The expected result is Negative.  Fact Sheet for Patients: SugarRoll.be  Fact Sheet for Healthcare Providers: https://www.woods-mathews.com/  This test is not yet approved or cleared by the [place] FDA and  has been authorized for detection and/or diagnosis of SARS-CoV-2 by FDA under an Emergency Use Authorization (EUA). This EUA will remain  in effect (meaning this test can be used) for the duration of the COVID-19 declaration under Se                          ction 564(b)(1) of the Act, 21 U.S.C. section 360bbb-3(b)(1), unless the authorization is terminated or revoked sooner.  Performed at [facility], [place] [address] [place], [place] [address]   Hospital Outpatient Visit on 01/15/2021  Component Date Value Ref Range Status  . MRSA, PCR 01/15/2021 NEGATIVE  NEGATIVE Final  . Staphylococcus aureus 01/15/2021 NEGATIVE  NEGATIVE Final   Comment: (NOTE) The Xpert SA Assay (FDA approved for NASAL specimens in patients 59 years of age and older), is one component of a comprehensive surveillance program. It is not intended to diagnose infection nor to guide or monitor treatment. Performed at [facility], [place] [address] [place], [place] [address]   . ABO/RH(D) 01/15/2021 O POS   Final  . Antibody Screen 01/15/2021 NEG   Final  . Sample Expiration 01/15/2021 01/26/2021,2359   Final  . Extend sample reason 01/15/2021    Final                   Value:NO TRANSFUSIONS OR PREGNANCY IN THE PAST 3 MONTHS Performed at [facility], [place] [address], [place], [place] [address]   . WBC 01/15/2021 4.6  4.0 - 10.5 K/uL Final  . RBC 01/15/2021 4.67  3.87 - 5.11 MIL/uL Final  . Hemoglobin 01/15/2021 13.3  12.0 - 15.0 g/dL Final  . HCT 01/15/2021 41.5  36.0 - 46.0 % Final  . MCV 01/15/2021 88.9  80.0 - 100.0 fL Final  . MCH 01/15/2021 28.5  26.0 - 34.0 pg Final  . MCHC 01/15/2021 32.0  30.0 - 36.0 g/dL Final  . RDW 01/15/2021 14.3  11.5 - 15.5 % Final  . Platelets 01/15/2021 368  150 - 400 K/uL Final  . nRBC 01/15/2021 0.0  0.0 - 0.2 % Final   Performed at [facility], [place] [address] [place], [place] [address]  .  Sodium 01/15/2021 140  135 - 145 mmol/L Final  . Potassium 01/15/2021 3.6  3.5 - 5.1 mmol/L Final  . Chloride 01/15/2021 110  98 - 111 mmol/L Final  . CO2 01/15/2021 22  22 - 32 mmol/L Final  . Glucose, Bld 01/15/2021 94  70 - 99 mg/dL Final   Glucose reference range applies only to samples taken after fasting for at least 8 hours.  . BUN 01/15/2021 12  6 - 20 mg/dL Final  . Creatinine, Ser 01/15/2021 0.89  0.44 - 1.00 mg/dL Final  . Calcium 01/15/2021 9.3  8.9 - 10.3 mg/dL Final  . GFR, Estimated 01/15/2021 >60  >60 mL/min Final   Comment: (NOTE) Calculated using the CKD-EPI Creatinine Equation (2021)   . Anion gap 01/15/2021 8  5 - 15 Final   Performed at [facility], [place] [address], [place], [place] [address]     X-Rays:No results found.  EKG: Orders placed or performed during the hospital encounter of 01/15/21  . EKG 12 lead per protocol  . EKG 12 lead per protocol     Hospital Course: DOREENA MAULDEN is a 55 y.o. who was admitted to [facility]. They were brought to the operating room on 01/23/2021 and underwent Procedure(s): TOTAL KNEE ARTHROPLASTY.  Patient tolerated the procedure well and was later transferred to the recovery room and then to the orthopaedic floor for postoperative care. They were given PO and IV analgesics for pain  control following their surgery. They were given 24 hours of postoperative antibiotics of  Anti-infectives (From admission, onward)   Start     Dose/Rate Route Frequency Ordered Stop   01/23/21 1500  ceFAZolin (ANCEF) IVPB 2g/100 mL premix        2 g 200 mL/hr over 30 Minutes Intravenous Every 6 hours 01/23/21 1249 01/23/21 2219   01/23/21 0645  ceFAZolin (ANCEF) IVPB 2g/100 mL premix        2 g 200 mL/hr over 30 Minutes Intravenous On call to O.R. 01/23/21 3532 01/23/21 0920     and started on DVT prophylaxis in the form of Aspirin.   PT and OT were ordered for total joint protocol. Discharge planning consulted to help with postop disposition and equipment needs.  Patient had a good night on the evening of surgery. They started to get up OOB with therapy on POD #0. Pt was seen during rounds and was ready to go home pending progress with therapy.She worked with therapy on POD #1 and was meeting her goals. Pt was discharged to home later that day in stable condition.  Diet: Regular diet Activity: WBAT Follow-up: in 2 weeks Disposition: Home Discharged Condition: good   Discharge Instructions    Call MD / Call 911   Complete by: As directed    If you experience chest pain or shortness of breath, CALL 911 and be transported to the hospital emergency room.  If you develope a fever above 101 F, pus (white drainage) or increased drainage or redness at the wound, or calf pain, call your surgeon's office.   Change dressing   Complete by: As directed    Maintain surgical dressing until follow up in the clinic. If the edges start to pull up, may reinforce with tape. If the dressing is no longer working, may remove and cover with gauze and tape, but must keep the area dry and clean.  Call with any questions or concerns.   Constipation Prevention   Complete by: As  directed    Drink plenty of fluids.  Prune juice may be helpful.  You may use a stool softener, such as Colace (over the counter) 100 mg  twice a day.  Use MiraLax (over the counter) for constipation as needed.   Diet - low sodium heart healthy   Complete by: As directed    Increase activity slowly as tolerated   Complete by: As directed    Weight bearing as tolerated with assist device (walker, cane, etc) as directed, use it as long as suggested by your surgeon or therapist, typically at least 4-6 weeks.   Post-operative opioid taper instructions:   Complete by: As directed    POST-OPERATIVE OPIOID TAPER INSTRUCTIONS: It is important to wean off of your opioid medication as soon as possible. If you do not need pain medication after your surgery it is ok to stop day one. Opioids include: Codeine, Hydrocodone(Norco, Vicodin), Oxycodone(Percocet, oxycontin) and hydromorphone amongst others.  Long term and even short term use of opiods can cause: Increased pain response Dependence Constipation Depression Respiratory depression And more.  Withdrawal symptoms can include Flu like symptoms Nausea, vomiting And more Techniques to manage these symptoms Hydrate well Eat regular healthy meals Stay active Use relaxation techniques(deep breathing, meditating, yoga) Do Not substitute Alcohol to help with tapering If you have been on opioids for less than two weeks and do not have pain than it is ok to stop all together.  Plan to wean off of opioids This plan should start within one week post op of your joint replacement. Maintain the same interval or time between taking each dose and first decrease the dose.  Cut the total daily intake of opioids by one tablet each day Next start to increase the time between doses. The last dose that should be eliminated is the evening dose.      TED hose   Complete by: As directed    Use stockings (TED hose) for 2 weeks on both leg(s).  You may remove them at night for sleeping.     Allergies as of 01/24/2021      Reactions   Iodinated Diagnostic Agents Anaphylaxis, Hives   Other  Shortness Of Breath   Lemon grass   Wasp Venom Anaphylaxis, Shortness Of Breath   Pollen Extract Swelling   Adhesive [tape] Rash   Hydromorphone Hcl Nausea And Vomiting   Omnipaque [iohexol] Hives, Nausea Only   Pt. Was premedicated with emergent protocol.      Medication List    STOP taking these medications   ibuprofen 800 MG tablet Commonly known as: ADVIL     TAKE these medications   acetaminophen 500 MG tablet Commonly known as: TYLENOL Take 2 tablets (1,000 mg total) by mouth every 8 (eight) hours. What changed:   when to take this  reasons to take this   acetaZOLAMIDE 500 MG capsule Commonly known as: DIAMOX TAKE 1 CAPSULE BY MOUTH 2 TIMES DAILY What changed: how much to take   albuterol 108 (90 Base) MCG/ACT inhaler Commonly known as: VENTOLIN HFA Inhale 1-2 puffs into the lungs every 6 (six) hours as needed for wheezing or shortness of breath.   albuterol (2.5 MG/3ML) 0.083% nebulizer solution Commonly known as: PROVENTIL Take 2.5 mg by nebulization every 6 (six) hours as needed for shortness of breath.   amitriptyline 25 MG tablet Commonly known as: ELAVIL TAKE 1 TABLET BY MOUTH AT BEDTIME What changed: how much to take   amLODipine 5 MG tablet Commonly  known as: NORVASC TAKE 1 TABLET BY MOUTH ONCE DAILY What changed:   how much to take  when to take this  Another medication with the same name was removed. Continue taking this medication, and follow the directions you see here.   Aspirin Low Dose 81 MG chewable tablet Generic drug: aspirin Chew 1 tablet (81 mg total) by mouth 2 (two) times daily for 28 days.   celecoxib 100 MG capsule Commonly known as: CELEBREX Take 1 capsule (100 mg total) by mouth 2 (two) times daily.   ciprofloxacin 500 MG tablet Commonly known as: CIPRO Take 500 mg by mouth 2 (two) times daily as needed (diverticulitis).   cyclobenzaprine 10 MG tablet Commonly known as: FLEXERIL TAKE 1 TABLET BY MOUTH 3 TIMES  DAILY What changed: Another medication with the same name was removed. Continue taking this medication, and follow the directions you see here.   diphenhydrAMINE 25 MG tablet Commonly known as: BENADRYL Take 25 mg by mouth every 6 (six) hours as needed for allergies.   docusate sodium 100 MG capsule Commonly known as: COLACE Take 1 capsule (100 mg total) by mouth 2 (two) times daily.   Emgality 120 MG/ML Soaj Generic drug: Galcanezumab-gnlm Inject 120 mg into the skin every 30 (thirty) days.   EPINEPHrine 0.3 mg/0.3 mL Soaj injection Commonly known as: EpiPen Inject 0.3 mLs (0.3 mg total) into the muscle once.   famotidine 40 MG tablet Commonly known as: PEPCID TAKE 1 TABLET BY MOUTH AT BEDTIME. What changed: how much to take   gabapentin 300 MG capsule Commonly known as: NEURONTIN Take 1 capsule (300 mg total) by mouth at bedtime. What changed:   when to take this  reasons to take this   K2 PLUS D3 PO Take 2 capsules by mouth daily.   loratadine 10 MG tablet Commonly known as: CLARITIN Take 10 mg by mouth at bedtime.   losartan 100 MG tablet Commonly known as: COZAAR TAKE 1 TABLET BY MOUTH ONCE DAILY What changed:   how much to take  when to take this  Another medication with the same name was removed. Continue taking this medication, and follow the directions you see here.   Magnesium 500 MG Caps Take 2,000 mg by mouth at bedtime.   meclizine 25 MG tablet Commonly known as: ANTIVERT Take 25 mg by mouth 3 (three) times daily as needed for dizziness.   methocarbamol 500 MG tablet Commonly known as: ROBAXIN Take 1 tablet (500 mg total) by mouth every 6 (six) hours as needed for muscle spasms.   metroNIDAZOLE 500 MG tablet Commonly known as: FLAGYL Take 500 mg by mouth 3 (three) times daily as needed.   montelukast 10 MG tablet Commonly known as: SINGULAIR TAKE 1 TABLET BY MOUTH EVERY EVENING What changed: Another medication with the same name was  removed. Continue taking this medication, and follow the directions you see here.   multivitamin tablet Take 1 tablet by mouth daily.   ondansetron 4 MG tablet Commonly known as: ZOFRAN Take 4 mg by mouth every 8 (eight) hours as needed for nausea or vomiting.   oxyCODONE 5 MG immediate release tablet Commonly known as: Oxy IR/ROXICODONE Take 1-2 tablets (5-10 mg total) by mouth every 4 (four) hours as needed for severe pain.   pantoprazole 40 MG tablet Commonly known as: PROTONIX TAKE (1) TABLET BY MOUTH TWICE DAILY BEFORE A MEAL. What changed: Another medication with the same name was changed. Make sure you understand how and  when to take each.   pantoprazole 40 MG tablet Commonly known as: PROTONIX TAKE 1 TABLET BY MOUTH 2 TIMES DAILY What changed: how much to take   polyethylene glycol 17 g packet Commonly known as: MIRALAX / GLYCOLAX Take 17 g by mouth 2 (two) times daily. What changed:   when to take this  reasons to take this   rizatriptan 10 MG disintegrating tablet Commonly known as: MAXALT-MLT Take 1 tablet (10 mg total) by mouth as needed for migraine. May repeat in 2 hours if needed   VITAMIN B COMPLEX 100 IJ Inject 1,000 mg as directed every 30 (thirty) days.            Discharge Care Instructions  (From admission, onward)         Start     Ordered   01/24/21 0000  Change dressing       Comments: Maintain surgical dressing until follow up in the clinic. If the edges start to pull up, may reinforce with tape. If the dressing is no longer working, may remove and cover with gauze and tape, but must keep the area dry and clean.  Call with any questions or concerns.   01/24/21 6761          Follow-up Information    Paralee Cancel, MD. Schedule an appointment as soon as possible for a visit in 2 weeks.   Specialty: Orthopedic Surgery Contact information: [address] [place] [address] [phone]                Signed: Griffith Citron, PA-C Orthopedic Surgery 01/27/2021, 8:22 AM

## 2021-01-27 NOTE — Therapy (Signed)
[place], [place], [address] Phone: [phone]   Fax:  [phone]  Physical Therapy Evaluation  Patient Details  Name: [name] MRN: [id number] Date of Birth: 1965/11/26 Referring Provider (PT): Paralee Cancel, MD   Encounter Date: 01/27/2021   PT End of Session - 01/27/21 1311    Visit Number 1    Number of Visits 18    Date for PT Re-Evaluation 03/10/21    Authorization Type [employer] - no auth or VL but review at the 25th visit    Authorization - Visit Number 1    Authorization - Number of Visits 25    Progress Note Due on Visit 10    PT Start Time 1315    PT Stop Time 1400    PT Time Calculation (min) 45 min           Past Medical History:  Diagnosis Date  . Anxiety   . Arthritis   . Asthma   . Cervical hyperplasia    hisotry of  . Chest pain    cath 2005 norm cors, repeat cath Feb 2011 normal cors and only minimally  elevated pulmonary pressures  . Crohn disease ([place])   . Diverticulitis of colon 07/17/2014  . Diverticulosis   . Dyspnea   . GERD (gastroesophageal reflux disease)   . Hiatal hernia   . History of iron deficiency anemia   . Hypertension   . Idiopathic intracranial hypertension    Per patient  . Migraines   . Obesity   . Palpitations   . Pneumonia 2008   14 days hospitalization, bilateral  . PONV (postoperative nausea and vomiting)   . Sleep apnea sleep study 11/03/2009    cpap; does sometimes, doesnt use every night. weight loss no longer using CPAP  . Vitamin B deficiency     Past Surgical History:  Procedure Laterality Date  . ABDOMINAL HYSTERECTOMY    . ANKLE ARTHROSCOPY  06/01/2012   Procedure: ANKLE ARTHROSCOPY;  Surgeon: Colin Rhein, MD;  Location: [facility];  Service: Orthopedics;  Laterality: Left;  left ankle arthorsocpy with extensive debridement and gastroc slide  . ANKLE SURGERY Left 05/11/2018  . BIOPSY  04/26/2019    Procedure: BIOPSY;  Surgeon: Rogene Houston, MD;  [facility];  Service: Endoscopy;;  ileum colon  . CARDIAC CATHETERIZATION  11/22/2009 and 2005   WNL  . CESAREAN SECTION    . CHOLECYSTECTOMY  09/08/2006   lap. chole.  . CHONDROPLASTY  06/17/2012   Procedure: CHONDROPLASTY;  Surgeon: Carole Civil, MD;  [facility];  Service: Orthopedics;  Laterality: Right;  right patella  . COLONOSCOPY N/A 04/26/2019   Procedure: COLONOSCOPY;  Surgeon: Rogene Houston, MD;  [facility];  Service: Endoscopy;  Laterality: N/A;  100  . ESOPHAGOGASTRODUODENOSCOPY N/A 05/14/2015   Procedure: ESOPHAGOGASTRODUODENOSCOPY (EGD);  Surgeon: Rogene Houston, MD;  [facility];  Service: Endoscopy;  Laterality: N/A;  730  . GIVENS CAPSULE STUDY N/A 05/24/2019   Procedure: GIVENS CAPSULE STUDY;  Surgeon: Rogene Houston, MD;  [facility];  Service: Endoscopy;  Laterality: N/A;  7:30  . INGUINAL HERNIA REPAIR  10/30/2008   right  . KNEE ARTHROSCOPY Right 2013  . KNEE ARTHROSCOPY WITH LATERAL MENISECTOMY Left 12/23/2016   Procedure: LEFT KNEE ARTHROSCOPY WITH LATERAL MENISECTOMY;  Surgeon: Carole Civil, MD;  [facility];  Service: Orthopedics;  Laterality: Left;  . KNEE ARTHROSCOPY WITH MEDIAL MENISECTOMY Left 12/23/2016   Procedure: LEFT KNEE ARTHROSCOPY WITH MEDIAL MENISECTOMY CHONDROPLASTY PATELLA  AND MEDIAL FEMORAL CONDYLE LEFT KNEE;  Surgeon: Carole Civil, MD;  Location[facility];  Service: Orthopedics;  Laterality: Left;  . SHOULDER ARTHROSCOPY WITH SUBACROMIAL DECOMPRESSION AND BICEP TENDON REPAIR Left 12/21/2017   Procedure: Left shoulder arthroscopic biceps tenodesis, SAD, DCR and labrum debridement;  Surgeon: Nicholes Stairs, MD;  Location: [facility];  Service: Orthopedics;  Laterality: Left;  120 mins  . SHOULDER SURGERY     right x 2   . TOTAL KNEE ARTHROPLASTY Left 06/06/2019   Procedure: TOTAL KNEE ARTHROPLASTY;  Surgeon: Paralee Cancel, MD;   Location: [facility];  Service: Orthopedics;  Laterality: Left;  70 mins  . TOTAL KNEE ARTHROPLASTY Right 01/23/2021   Procedure: TOTAL KNEE ARTHROPLASTY;  Surgeon: Paralee Cancel, MD;  Location: [facility];  Service: Orthopedics;  Laterality: Right;  70 mins    There were no vitals filed for this visit.    Subjective Assessment - 01/27/21 1328    Subjective States she had knee surgery on 01/23/21 and she felt good up until yesterday and she felt like she could cut back on pain meds and now she is having difficulty moving her legs. States she has been doing her hospital exercises. States her current pain level is 7/10 and described as throbbing pain all over her knee. States she would like to be able to walk without her walker. States she had her left knee down over a year ago. States her ultimate goal is to ride a bike outside. States she had a previous surgery on her right knee in 2013 where she had a meniscal repair.Reports she did not have full motion in her right for a long time    Pertinent History previous knee surgery    Patient Stated Goals to be able to walk without waler    Currently in Pain? Yes    Pain Score 7     Pain Location Knee    Pain Orientation Right    Pain Descriptors / Indicators Aching;Throbbing    Pain Type Surgical pain    Pain Onset In the past 7 days    Pain Frequency Constant    Aggravating Factors  bending, walking    Pain Relieving Factors rest, medications              [facility] PT Assessment - 01/27/21 0001      Assessment   Medical Diagnosis R TKA    Referring Provider (PT) Paralee Cancel, MD    Onset Date/Surgical Date 01/23/21    Next MD Visit 02/06/21    Prior Therapy in hospital      Balance Screen   Has the patient fallen in the past 6 months No      [place] residence    Living Arrangements Spouse/significant other    Available Help at Discharge Family    Type of [place] Access Level entry    [place] Two level;Able to live on main level with bedroom/bathroom    [place] - single point;Bedside commode   rollator     Prior Function   Level of Independence Independent;Independent with basic ADLs    Vocation Full time employment      Cognition   Overall Cognitive Status Within Functional Limits for tasks assessed      Observation/Other Assessments  Focus on Therapeutic Outcomes (FOTO)  3.1% function      Observation/Other Assessments-Edema    Edema Circumferential      Circumferential Edema   Circumferential - Right at knee joint 46.5cm, at largest at calf 44.5cm    Circumferential - Left  at knee joint 51.0cm, at largest at calf 48.5cm      ROM / Strength   AROM / PROM / Strength AROM;Strength      AROM   AROM Assessment Site Knee    Right/Left Knee Right;Left    Right Knee Extension 6   lacking, 06/08/19 was zero   Right Knee Flexion 81   06/08/19 was 93   Left Knee Extension 0    Left Knee Flexion 119      Strength   Overall Strength Comments unable to lift leg up from table - poor quad isometric      Palpation   Palpation comment tenderness and around right knee. Tightness throughout skin round right knee.      Special Tests   Other special tests negative homan's sign      Transfers   Transfers Stand to Sit;Sit to Stand    Sit to Stand With upper extremity assist   favors left leg   Stand to Sit With upper extremity assist   favors left leg     Ambulation/Gait   Ambulation/Gait Yes    Ambulation/Gait Assistance 6: Modified independent (Device/Increase time);5: Supervision    Ambulation Distance (Feet) 125 Feet    Assistive device --   rollator   Gait Pattern Decreased stance time - right;Decreased stride length;Decreased hip/knee flexion - right;Decreased dorsiflexion - right;Decreased weight shift to right;Right circumduction    Ambulation Surface Level;Indoor    Gait velocity decreased    Gait Comments 2MW                       Objective measurements completed on examination: See above findings.       [facility] Adult PT Treatment/Exercise - 01/27/21 0001      Exercises   Exercises Knee/Hip      Knee/Hip Exercises: Aerobic   Stationary Bike rocking back and forth 5 minutes      Knee/Hip Exercises: Supine   Heel Slides AAROM;Right;10 reps;Other (comment)   10 second holds                 PT Education - 01/27/21 1346    Education Details on current presenation, on HEP, on plan, on realistic ROM, on compression garments and elevation    Person(s) Educated Patient    Methods Explanation    Comprehension Verbalized understanding            PT Short Term Goals - 01/27/21 1313      PT SHORT TERM GOAL #1   Title Patient will be independent in self management strategies to improve quality of life and functional outcomes.    Time 3    Period Weeks    Status New    Target Date 02/17/21      PT SHORT TERM GOAL #2   Title Patient will report at least 50% improvement in overall symptoms and/or function to demonstrate improved functional mobility    Time 3    Period Weeks    Status New    Target Date 02/17/21      PT SHORT TERM GOAL #3   Title Patient will be able to ambualte at least 226 feet with least  restrictive assistive device in 2 minutes to demonstrate improved walking endurance.    Time 3    Period Weeks    Status New    Target Date 02/17/21      PT SHORT TERM GOAL #4   Title Patient will achieve 0-100 degrees in right knee to demonstrate improved knee motion    Time 3    Period Weeks    Status New    Target Date 02/17/21             PT Long Term Goals - 01/27/21 1313      PT LONG TERM GOAL #1   Title Patient will report at least 75% improvement in overall symptoms and/or function to demonstrate improved functional mobility    Time 6    Period Weeks    Status New    Target Date 03/10/21      PT LONG TERM GOAL #2   Title Patient will improve on  FOTO score to meet predicted outcomes to demonstrate improved functional mobility.    Time 6    Period Weeks    Status New    Target Date 03/10/21      PT LONG TERM GOAL #3   Title Patient will be able to ambualte at least 226 feet without assistive device in 2 minutes to demonstrate improved walking endurance.    Time 6    Period Weeks    Status New    Target Date 03/10/21      PT LONG TERM GOAL #4   Title Patient will achieve 0-110 degrees in right knee to demonstrate improved knee motion    Time 6    Period Weeks    Status New    Target Date 03/10/21                  Plan - 01/27/21 1358    Clinical Impression Statement Patient presents to PT after right TKA on 01/23/21. She presents with significant swelling that limits her overall function and presents with poor quad activation. Patient educated on benefits of compression garments and elevation. Patient would greatly benefit from skilled physical therapy to improve overall function and mobility. To note, this is patient's second knee surgery on the right knee and she has had severely limited knee ROM for years, overall knee mobility outcomes may be limited secondary to this.    Personal Factors and Comorbidities Comorbidity 1    Comorbidities hx of rigth knee surgery    Examination-Activity Limitations Bathing;Sleep;Bed Mobility;Squat;Stairs;Stand;Transfers;Locomotion Level;Bend;Sit    Examination-Participation Restrictions Cleaning;Community Activity;Driving;Meal Prep;Occupation    Stability/Clinical Decision Making Stable/Uncomplicated    Clinical Decision Making Low    Rehab Potential Good    PT Frequency 3x / week    PT Duration 6 weeks    PT Treatment/Interventions ADLs/Self Care Home Management;Aquatic Therapy;Cryotherapy;Electrical Stimulation;Moist Heat;Traction;Balance training;Therapeutic exercise;Therapeutic activities;Functional mobility training;Stair training;Gait training;DME Instruction;Neuromuscular  re-education;Patient/family education;Manual techniques;Dry needling;Passive range of motion;Joint Manipulations    PT Next Visit Plan edema management, quad activation, knee ROM    PT Home Exercise Plan elevation, compression garments, heel slides    Consulted and Agree with Plan of Care Patient           Patient will benefit from skilled therapeutic intervention in order to improve the following deficits and impairments:  Abnormal gait,Decreased endurance,Decreased skin integrity,Pain,Increased edema,Decreased scar mobility,Decreased knowledge of precautions,Decreased balance,Decreased mobility,Difficulty walking,Decreased range of motion,Decreased strength,Decreased activity tolerance  Visit Diagnosis: Difficulty in walking, not elsewhere classified  Muscle  weakness (generalized)  Acute pain of right knee     Problem List Patient Active Problem List   Diagnosis Date Noted  . S/P total knee arthroplasty, right 01/23/2021  . IIH (idiopathic intracranial hypertension) 10/14/2020  . Discoloration of skin of finger 09/12/2020  . Bilateral hand pain 09/12/2020  . Paresthesia of skin 09/12/2020  . Chronic migraine without aura without status migrainosus, not intractable 06/22/2020  . Iron deficiency anemia 06/20/2019  . S/P left TKA 06/06/2019  . Status post total left knee replacement 06/06/2019  . Special screening for malignant neoplasms, colon 03/28/2019  . LLQ abdominal pain 09/26/2018  . Derangement of anterior horn of lateral meniscus of left knee   . Derangement of posterior horn of medial meniscus of left knee   . Chest pain 04/25/2016  . Hypokalemia 04/25/2016  . Hyperglycemia 04/25/2016  . Diverticulitis of colon 07/17/2014  . Nausea without vomiting 07/17/2014  . Crohn disease ([place]) 07/17/2014  . History of arthroscopy of right knee 07/25/2012  . Difficulty in walking(719.7) 06/29/2012  . Weakness of right leg 06/29/2012  . Posterior tibial tendonitis 11/11/2011   . PTTD (posterior tibial tendon dysfunction) 11/11/2011  . Knee pain, right 11/11/2011  . Chest pain 07/16/2011  . Primary osteoarthritis of left knee 02/25/2010  . PAIN IN JOINT, MULTIPLE SITES 02/25/2010  . Essential hypertension 08/13/2009  . HIATAL HERNIA 08/13/2009  . PALPITATIONS 08/13/2009  . DYSPNEA 08/13/2009    2:14 PM, 01/27/21 Jerene Pitch, DPT Physical Therapy with [facility]  [phone] office  [place] [address] [place], [place], [address] Phone: [phone]   Fax:  [phone]  Name: [name] MRN: [id number] Date of Birth: 26-Nov-1965

## 2021-01-28 ENCOUNTER — Other Ambulatory Visit (HOSPITAL_COMMUNITY): Payer: Self-pay

## 2021-01-28 ENCOUNTER — Ambulatory Visit (HOSPITAL_COMMUNITY): Payer: 59

## 2021-01-28 ENCOUNTER — Encounter (HOSPITAL_COMMUNITY): Payer: Self-pay

## 2021-01-28 DIAGNOSIS — M6281 Muscle weakness (generalized): Secondary | ICD-10-CM

## 2021-01-28 DIAGNOSIS — M25561 Pain in right knee: Secondary | ICD-10-CM | POA: Diagnosis not present

## 2021-01-28 DIAGNOSIS — R262 Difficulty in walking, not elsewhere classified: Secondary | ICD-10-CM | POA: Diagnosis not present

## 2021-01-28 MED ORDER — ONDANSETRON HCL 4 MG PO TABS
4.0000 mg | ORAL_TABLET | Freq: Three times a day (TID) | ORAL | 0 refills | Status: DC | PRN
  Filled 2021-01-28: qty 30, 10d supply, fill #0

## 2021-01-28 NOTE — Therapy (Signed)
[facility] [address] [place], [place], [address] Phone: [phone]   Fax:  [phone]  Physical Therapy Treatment  Patient Details  Name: [name] MRN: [id number] Date of Birth: Feb 25, 1966 Referring Provider (PT): Paralee Cancel, MD   Encounter Date: 01/28/2021   PT End of Session - 01/28/21 1311    Visit Number 2    Number of Visits 18    Date for PT Re-Evaluation 03/10/21    Authorization Type [employer] - no auth or VL but review at the 25th visit    Authorization - Visit Number 2    Authorization - Number of Visits 25    Progress Note Due on Visit 10    PT Start Time 1301    PT Stop Time 1345    PT Time Calculation (min) 44 min    Equipment Utilized During Treatment Gait belt    Activity Tolerance Patient tolerated treatment well           Past Medical History:  Diagnosis Date  . Anxiety   . Arthritis   . Asthma   . Cervical hyperplasia    hisotry of  . Chest pain    cath 2005 norm cors, repeat cath Feb 2011 normal cors and only minimally  elevated pulmonary pressures  . Crohn disease ([place])   . Diverticulitis of colon 07/17/2014  . Diverticulosis   . Dyspnea   . GERD (gastroesophageal reflux disease)   . Hiatal hernia   . History of iron deficiency anemia   . Hypertension   . Idiopathic intracranial hypertension    Per patient  . Migraines   . Obesity   . Palpitations   . Pneumonia 2008   14 days hospitalization, bilateral  . PONV (postoperative nausea and vomiting)   . Sleep apnea sleep study 11/03/2009    cpap; does sometimes, doesnt use every night. weight loss no longer using CPAP  . Vitamin B deficiency     Past Surgical History:  Procedure Laterality Date  . ABDOMINAL HYSTERECTOMY    . ANKLE ARTHROSCOPY  06/01/2012   Procedure: ANKLE ARTHROSCOPY;  Surgeon: Colin Rhein, MD;  Location: [facility];  Service: Orthopedics;  Laterality: Left;  left ankle arthorsocpy with  extensive debridement and gastroc slide  . ANKLE SURGERY Left 05/11/2018  . BIOPSY  04/26/2019   Procedure: BIOPSY;  Surgeon: Rogene Houston, MD;  [facility];  Service: Endoscopy;;  ileum colon  . CARDIAC CATHETERIZATION  11/22/2009 and 2005   WNL  . CESAREAN SECTION    . CHOLECYSTECTOMY  09/08/2006   lap. chole.  . CHONDROPLASTY  06/17/2012   Procedure: CHONDROPLASTY;  Surgeon: Carole Civil, MD;  [facility];  Service: Orthopedics;  Laterality: Right;  right patella  . COLONOSCOPY N/A 04/26/2019   Procedure: COLONOSCOPY;  Surgeon: Rogene Houston, MD;  [facility];  Service: Endoscopy;  Laterality: N/A;  100  . ESOPHAGOGASTRODUODENOSCOPY N/A 05/14/2015   Procedure: ESOPHAGOGASTRODUODENOSCOPY (EGD);  Surgeon: Rogene Houston, MD;  [facility];  Service: Endoscopy;  Laterality: N/A;  730  . GIVENS CAPSULE STUDY N/A 05/24/2019   Procedure: GIVENS CAPSULE STUDY;  Surgeon: Rogene Houston, MD;  [facility];  Service: Endoscopy;  Laterality: N/A;  7:30  . INGUINAL HERNIA REPAIR  10/30/2008   right  . KNEE ARTHROSCOPY Right 2013  . KNEE ARTHROSCOPY WITH LATERAL MENISECTOMY Left 12/23/2016   Procedure: LEFT  KNEE ARTHROSCOPY WITH LATERAL MENISECTOMY;  Surgeon: Carole Civil, MD;  [facility];  Service: Orthopedics;  Laterality: Left;  . KNEE ARTHROSCOPY WITH MEDIAL MENISECTOMY Left 12/23/2016   Procedure: LEFT KNEE ARTHROSCOPY WITH MEDIAL MENISECTOMY CHONDROPLASTY PATELLA  AND MEDIAL FEMORAL CONDYLE LEFT KNEE;  Surgeon: Carole Civil, MD;  [facility];  Service: Orthopedics;  Laterality: Left;  . SHOULDER ARTHROSCOPY WITH SUBACROMIAL DECOMPRESSION AND BICEP TENDON REPAIR Left 12/21/2017   Procedure: Left shoulder arthroscopic biceps tenodesis, SAD, DCR and labrum debridement;  Surgeon: Nicholes Stairs, MD;  Location: [facility];  Service: Orthopedics;  Laterality: Left;  120 mins  . SHOULDER SURGERY     right x 2   . TOTAL  KNEE ARTHROPLASTY Left 06/06/2019   Procedure: TOTAL KNEE ARTHROPLASTY;  Surgeon: Paralee Cancel, MD;  [facility];  Service: Orthopedics;  Laterality: Left;  70 mins  . TOTAL KNEE ARTHROPLASTY Right 01/23/2021   Procedure: TOTAL KNEE ARTHROPLASTY;  Surgeon: Paralee Cancel, MD;  [facility];  Service: Orthopedics;  Laterality: Right;  70 mins    There were no vitals filed for this visit.   Subjective Assessment - 01/28/21 1307    Subjective Pt reports swelling and discomfort along right knee but overall doing well    Pertinent History previous knee surgery    Patient Stated Goals to be able to walk without waler    Currently in Pain? Yes    Pain Score 7     Pain Location Knee    Pain Orientation Right    Pain Descriptors / Indicators Aching;Throbbing    Pain Type Surgical pain    Pain Onset In the past 7 days                             [facility] Adult PT Treatment/Exercise - 01/28/21 0001      Knee/Hip Exercises: Aerobic   Nustep level 1 x 5 min for AAROM      Knee/Hip Exercises: Seated   Hamstring Curl AROM;Right    Hamstring Limitations 2 min, floor sides      Knee/Hip Exercises: Supine   Quad Sets Strengthening;Right;2 sets;10 reps    Heel Slides AAROM;2 sets;Right;10 reps    Knee Extension AROM    Knee Extension Limitations 3 degrees lacking    Knee Flexion AROM    Knee Flexion Limitations 80 degrees    Other Supine Knee/Hip Exercises ankle pumps 2x2 min against stability ball and 2x20 reps manually resisted DF                    PT Short Term Goals - 01/27/21 1313      PT SHORT TERM GOAL #1   Title Patient will be independent in self management strategies to improve quality of life and functional outcomes.    Time 3    Period Weeks    Status New    Target Date 02/17/21      PT SHORT TERM GOAL #2   Title Patient will report at least 50% improvement in overall symptoms and/or function to demonstrate improved functional mobility     Time 3    Period Weeks    Status New    Target Date 02/17/21      PT SHORT TERM GOAL #3   Title Patient will be able to ambualte at least 226 feet with least restrictive assistive device in 2 minutes to demonstrate improved walking  endurance.    Time 3    Period Weeks    Status New    Target Date 02/17/21      PT SHORT TERM GOAL #4   Title Patient will achieve 0-100 degrees in right knee to demonstrate improved knee motion    Time 3    Period Weeks    Status New    Target Date 02/17/21             PT Long Term Goals - 01/27/21 1313      PT LONG TERM GOAL #1   Title Patient will report at least 75% improvement in overall symptoms and/or function to demonstrate improved functional mobility    Time 6    Period Weeks    Status New    Target Date 03/10/21      PT LONG TERM GOAL #2   Title Patient will improve on FOTO score to meet predicted outcomes to demonstrate improved functional mobility.    Time 6    Period Weeks    Status New    Target Date 03/10/21      PT LONG TERM GOAL #3   Title Patient will be able to ambualte at least 226 feet without assistive device in 2 minutes to demonstrate improved walking endurance.    Time 6    Period Weeks    Status New    Target Date 03/10/21      PT LONG TERM GOAL #4   Title Patient will achieve 0-110 degrees in right knee to demonstrate improved knee motion    Time 6    Period Weeks    Status New    Target Date 03/10/21                 Plan - 01/28/21 1345    Clinical Impression Statement Pt tolerating tx sessions well albeit with frequent rest periods due to exertion/fatigue of RLE. Continued POC indicated to improve activity tolerance and RLE ROM/Strength to progress for ambulation without AD    Personal Factors and Comorbidities Comorbidity 1    Comorbidities hx of rigth knee surgery    Examination-Activity Limitations Bathing;Sleep;Bed Mobility;Squat;Stairs;Stand;Transfers;Locomotion Level;Bend;Sit     Examination-Participation Restrictions Cleaning;Community Activity;Driving;Meal Prep;Occupation    Stability/Clinical Decision Making Stable/Uncomplicated    Rehab Potential Good    PT Frequency 3x / week    PT Duration 6 weeks    PT Treatment/Interventions ADLs/Self Care Home Management;Aquatic Therapy;Cryotherapy;Electrical Stimulation;Moist Heat;Traction;Balance training;Therapeutic exercise;Therapeutic activities;Functional mobility training;Stair training;Gait training;DME Instruction;Neuromuscular re-education;Patient/family education;Manual techniques;Dry needling;Passive range of motion;Joint Manipulations    PT Next Visit Plan edema management, quad activation, knee ROM    PT Home Exercise Plan elevation, compression garments, heel slides    Consulted and Agree with Plan of Care Patient           Patient will benefit from skilled therapeutic intervention in order to improve the following deficits and impairments:  Abnormal gait,Decreased endurance,Decreased skin integrity,Pain,Increased edema,Decreased scar mobility,Decreased knowledge of precautions,Decreased balance,Decreased mobility,Difficulty walking,Decreased range of motion,Decreased strength,Decreased activity tolerance  Visit Diagnosis: Difficulty in walking, not elsewhere classified  Muscle weakness (generalized)  Acute pain of right knee     Problem List Patient Active Problem List   Diagnosis Date Noted  . S/P total knee arthroplasty, right 01/23/2021  . IIH (idiopathic intracranial hypertension) 10/14/2020  . Discoloration of skin of finger 09/12/2020  . Bilateral hand pain 09/12/2020  . Paresthesia of skin 09/12/2020  . Chronic migraine without aura without status migrainosus, not  intractable 06/22/2020  . Iron deficiency anemia 06/20/2019  . S/P left TKA 06/06/2019  . Status post total left knee replacement 06/06/2019  . Special screening for malignant neoplasms, colon 03/28/2019  . LLQ abdominal pain  09/26/2018  . Derangement of anterior horn of lateral meniscus of left knee   . Derangement of posterior horn of medial meniscus of left knee   . Chest pain 04/25/2016  . Hypokalemia 04/25/2016  . Hyperglycemia 04/25/2016  . Diverticulitis of colon 07/17/2014  . Nausea without vomiting 07/17/2014  . Crohn disease ([place]) 07/17/2014  . History of arthroscopy of right knee 07/25/2012  . Difficulty in walking(719.7) 06/29/2012  . Weakness of right leg 06/29/2012  . Posterior tibial tendonitis 11/11/2011  . PTTD (posterior tibial tendon dysfunction) 11/11/2011  . Knee pain, right 11/11/2011  . Chest pain 07/16/2011  . Primary osteoarthritis of left knee 02/25/2010  . PAIN IN JOINT, MULTIPLE SITES 02/25/2010  . Essential hypertension 08/13/2009  . HIATAL HERNIA 08/13/2009  . PALPITATIONS 08/13/2009  . DYSPNEA 08/13/2009   1:47 PM, 01/28/21 M. Sherlyn Lees, PT, DPT Physical Therapist- [facility] Office Number: [phone]  [facility] [address] [place], [place], [address] Phone: [phone]   Fax:  [phone]  Name: [name] MRN: [id number] Date of Birth: 1966/01/10

## 2021-01-29 ENCOUNTER — Encounter (HOSPITAL_COMMUNITY): Payer: 59 | Admitting: Physical Therapy

## 2021-01-29 ENCOUNTER — Other Ambulatory Visit (HOSPITAL_COMMUNITY): Payer: Self-pay | Admitting: Student

## 2021-01-29 ENCOUNTER — Other Ambulatory Visit (HOSPITAL_COMMUNITY): Payer: Self-pay

## 2021-01-29 MED ORDER — PANTOPRAZOLE SODIUM 40 MG PO TBEC
1.0000 | DELAYED_RELEASE_TABLET | Freq: Two times a day (BID) | ORAL | 1 refills | Status: DC
  Filled 2021-01-29: qty 90, 45d supply, fill #0
  Filled 2021-03-27: qty 90, 45d supply, fill #1

## 2021-01-29 MED ORDER — OXYCODONE HCL 5 MG PO TABS
ORAL_TABLET | ORAL | 0 refills | Status: DC
  Filled 2021-01-29: qty 56, 5d supply, fill #0

## 2021-01-30 ENCOUNTER — Other Ambulatory Visit (HOSPITAL_COMMUNITY): Payer: Self-pay

## 2021-01-31 ENCOUNTER — Ambulatory Visit (HOSPITAL_COMMUNITY): Payer: 59

## 2021-02-03 ENCOUNTER — Other Ambulatory Visit: Payer: Self-pay

## 2021-02-03 ENCOUNTER — Ambulatory Visit (HOSPITAL_COMMUNITY): Payer: 59 | Attending: Student | Admitting: Physical Therapy

## 2021-02-03 ENCOUNTER — Encounter (HOSPITAL_COMMUNITY): Payer: Self-pay | Admitting: Physical Therapy

## 2021-02-03 DIAGNOSIS — M25561 Pain in right knee: Secondary | ICD-10-CM | POA: Diagnosis not present

## 2021-02-03 DIAGNOSIS — R262 Difficulty in walking, not elsewhere classified: Secondary | ICD-10-CM | POA: Diagnosis not present

## 2021-02-03 DIAGNOSIS — M25662 Stiffness of left knee, not elsewhere classified: Secondary | ICD-10-CM | POA: Insufficient documentation

## 2021-02-03 DIAGNOSIS — M6281 Muscle weakness (generalized): Secondary | ICD-10-CM

## 2021-02-03 NOTE — Therapy (Signed)
[facility] [address] [place], [place], [address] Phone: [phone]   Fax:  [phone]  Physical Therapy Treatment  Patient Details  Name: [name] MRN: [id number] Date of Birth: December 08, 1965 Referring Provider (PT): Paralee Cancel, MD   Encounter Date: 02/03/2021   PT End of Session - 02/03/21 1404    Visit Number 3    Number of Visits 18    Date for PT Re-Evaluation 03/10/21    Authorization Type [employer] - no auth or VL but review at the 25th visit    Authorization - Visit Number 3    Authorization - Number of Visits 25    Progress Note Due on Visit 10    PT Start Time 3557    PT Stop Time 1443    PT Time Calculation (min) 40 min    Equipment Utilized During Treatment Gait belt    Activity Tolerance Patient tolerated treatment well           Past Medical History:  Diagnosis Date  . Anxiety   . Arthritis   . Asthma   . Cervical hyperplasia    hisotry of  . Chest pain    cath 2005 norm cors, repeat cath Feb 2011 normal cors and only minimally  elevated pulmonary pressures  . Crohn disease ([place])   . Diverticulitis of colon 07/17/2014  . Diverticulosis   . Dyspnea   . GERD (gastroesophageal reflux disease)   . Hiatal hernia   . History of iron deficiency anemia   . Hypertension   . Idiopathic intracranial hypertension    Per patient  . Migraines   . Obesity   . Palpitations   . Pneumonia 2008   14 days hospitalization, bilateral  . PONV (postoperative nausea and vomiting)   . Sleep apnea sleep study 11/03/2009    cpap; does sometimes, doesnt use every night. weight loss no longer using CPAP  . Vitamin B deficiency     Past Surgical History:  Procedure Laterality Date  . ABDOMINAL HYSTERECTOMY    . ANKLE ARTHROSCOPY  06/01/2012   Procedure: ANKLE ARTHROSCOPY;  Surgeon: Colin Rhein, MD;  Location: [facility];  Service: Orthopedics;  Laterality: Left;  left ankle arthorsocpy with  extensive debridement and gastroc slide  . ANKLE SURGERY Left 05/11/2018  . BIOPSY  04/26/2019   Procedure: BIOPSY;  Surgeon: Rogene Houston, MD;  [facility];  Service: Endoscopy;;  ileum colon  . CARDIAC CATHETERIZATION  11/22/2009 and 2005   WNL  . CESAREAN SECTION    . CHOLECYSTECTOMY  09/08/2006   lap. chole.  . CHONDROPLASTY  06/17/2012   Procedure: CHONDROPLASTY;  Surgeon: Carole Civil, MD;  [facility];  Service: Orthopedics;  Laterality: Right;  right patella  . COLONOSCOPY N/A 04/26/2019   Procedure: COLONOSCOPY;  Surgeon: Rogene Houston, MD;  [facility];  Service: Endoscopy;  Laterality: N/A;  100  . ESOPHAGOGASTRODUODENOSCOPY N/A 05/14/2015   Procedure: ESOPHAGOGASTRODUODENOSCOPY (EGD);  Surgeon: Rogene Houston, MD;  [facility];  Service: Endoscopy;  Laterality: N/A;  730  . GIVENS CAPSULE STUDY N/A 05/24/2019   Procedure: GIVENS CAPSULE STUDY;  Surgeon: Rogene Houston, MD;  [facility];  Service: Endoscopy;  Laterality: N/A;  7:30  . INGUINAL HERNIA REPAIR  10/30/2008   right  . KNEE ARTHROSCOPY Right 2013  . KNEE ARTHROSCOPY WITH LATERAL MENISECTOMY Left 12/23/2016   Procedure: LEFT  KNEE ARTHROSCOPY WITH LATERAL MENISECTOMY;  Surgeon: Carole Civil, MD;  [facility];  Service: Orthopedics;  Laterality: Left;  . KNEE ARTHROSCOPY WITH MEDIAL MENISECTOMY Left 12/23/2016   Procedure: LEFT KNEE ARTHROSCOPY WITH MEDIAL MENISECTOMY CHONDROPLASTY PATELLA  AND MEDIAL FEMORAL CONDYLE LEFT KNEE;  Surgeon: Carole Civil, MD;  [facility];  Service: Orthopedics;  Laterality: Left;  . SHOULDER ARTHROSCOPY WITH SUBACROMIAL DECOMPRESSION AND BICEP TENDON REPAIR Left 12/21/2017   Procedure: Left shoulder arthroscopic biceps tenodesis, SAD, DCR and labrum debridement;  Surgeon: Nicholes Stairs, MD;  Location: [facility];  Service: Orthopedics;  Laterality: Left;  120 mins  . SHOULDER SURGERY     right x 2   . TOTAL  KNEE ARTHROPLASTY Left 06/06/2019   Procedure: TOTAL KNEE ARTHROPLASTY;  Surgeon: Paralee Cancel, MD;  [facility];  Service: Orthopedics;  Laterality: Left;  70 mins  . TOTAL KNEE ARTHROPLASTY Right 01/23/2021   Procedure: TOTAL KNEE ARTHROPLASTY;  Surgeon: Paralee Cancel, MD;  [facility];  Service: Orthopedics;  Laterality: Right;  70 mins    There were no vitals filed for this visit.   Subjective Assessment - 02/03/21 1416    Subjective Increased pain noted in right knee. States it almost got to the point where she wanted to cut off her leg on Thursday and Friday. States her swelling has been really bad even though she has been wearing her compression garments and elevating. States she was thinning out her pain medications because she was running out so she doesn't know if that had anything to do with it. States her current pain is 9/10 and thinks she may have gone up the step outside wrong.    Pertinent History previous knee surgery    Patient Stated Goals to be able to walk without waler    Currently in Pain? Yes    Pain Score 9     Pain Location Knee    Pain Orientation Right    Pain Descriptors / Indicators Aching;Tightness;Throbbing    Pain Onset In the past 7 days              [facility] PT Assessment - 02/03/21 0001      Assessment   Medical Diagnosis R TKA    Referring Provider (PT) Paralee Cancel, MD    Onset Date/Surgical Date 01/23/21    Next MD Visit 02/06/21      Observation/Other Assessments   Observations swelling throughout right leg, no warmth or redness noted.      Special Tests   Other special tests negative homan's sign bilaterally                         [facility] Adult PT Treatment/Exercise - 02/03/21 0001      Knee/Hip Exercises: Aerobic   Nustep level 1 x 5 min for AAROM      Knee/Hip Exercises: Seated   Other Seated Knee/Hip Exercises assisted with donner to get compression garment on - educated on use of donner      Knee/Hip  Exercises: Supine   Other Supine Knee/Hip Exercises knee bends on ball - not tolerated well - hamstring isometrics on ball 2x8 5" holds      Manual Therapy   Manual Therapy Edema management;Joint mobilization    Manual therapy comments All manual completed separately from other skilled interventions    Edema Management to right leg with leg elevated - not tolerated well    Joint  Mobilization -oscillations on ball - not tolerated well                  PT Education - 02/03/21 1507    Education Details on conitnued elevation, negative homan's, anticipated swelling but limited progress with current interventions.    Person(s) Educated Patient    Methods Explanation    Comprehension Verbalized understanding            PT Short Term Goals - 01/27/21 1313      PT SHORT TERM GOAL #1   Title Patient will be independent in self management strategies to improve quality of life and functional outcomes.    Time 3    Period Weeks    Status New    Target Date 02/17/21      PT SHORT TERM GOAL #2   Title Patient will report at least 50% improvement in overall symptoms and/or function to demonstrate improved functional mobility    Time 3    Period Weeks    Status New    Target Date 02/17/21      PT SHORT TERM GOAL #3   Title Patient will be able to ambualte at least 226 feet with least restrictive assistive device in 2 minutes to demonstrate improved walking endurance.    Time 3    Period Weeks    Status New    Target Date 02/17/21      PT SHORT TERM GOAL #4   Title Patient will achieve 0-100 degrees in right knee to demonstrate improved knee motion    Time 3    Period Weeks    Status New    Target Date 02/17/21             PT Long Term Goals - 01/27/21 1313      PT LONG TERM GOAL #1   Title Patient will report at least 75% improvement in overall symptoms and/or function to demonstrate improved functional mobility    Time 6    Period Weeks    Status New    Target  Date 03/10/21      PT LONG TERM GOAL #2   Title Patient will improve on FOTO score to meet predicted outcomes to demonstrate improved functional mobility.    Time 6    Period Weeks    Status New    Target Date 03/10/21      PT LONG TERM GOAL #3   Title Patient will be able to ambualte at least 226 feet without assistive device in 2 minutes to demonstrate improved walking endurance.    Time 6    Period Weeks    Status New    Target Date 03/10/21      PT LONG TERM GOAL #4   Title Patient will achieve 0-110 degrees in right knee to demonstrate improved knee motion    Time 6    Period Weeks    Status New    Target Date 03/10/21                 Plan - 02/03/21 1506    Clinical Impression Statement Poor tolerance to today's session. Increased swelling noted throughout right leg and poor tolerance to pain management strategies. Negative homan's sign but with moderate swelling throughout entire leg despite wearing compression garments, elevation and ROM exercises. Discussed continued edema management strategies , will continue with ROM and strength (isometrics) as tolerated.    Personal Factors and Comorbidities Comorbidity 1    Comorbidities  hx of rigth knee surgery    Examination-Activity Limitations Bathing;Sleep;Bed Mobility;Squat;Stairs;Stand;Transfers;Locomotion Level;Bend;Sit    Examination-Participation Restrictions Cleaning;Community Activity;Driving;Meal Prep;Occupation    Stability/Clinical Decision Making Stable/Uncomplicated    Rehab Potential Good    PT Frequency 3x / week    PT Duration 6 weeks    PT Treatment/Interventions ADLs/Self Care Home Management;Aquatic Therapy;Cryotherapy;Electrical Stimulation;Moist Heat;Traction;Balance training;Therapeutic exercise;Therapeutic activities;Functional mobility training;Stair training;Gait training;DME Instruction;Neuromuscular re-education;Patient/family education;Manual techniques;Dry needling;Passive range of motion;Joint  Manipulations    PT Next Visit Plan pain management strategies, edema management, quad activation, knee ROM    PT Home Exercise Plan elevation, compression garments, heel slides    Consulted and Agree with Plan of Care Patient           Patient will benefit from skilled therapeutic intervention in order to improve the following deficits and impairments:  Abnormal gait,Decreased endurance,Decreased skin integrity,Pain,Increased edema,Decreased scar mobility,Decreased knowledge of precautions,Decreased balance,Decreased mobility,Difficulty walking,Decreased range of motion,Decreased strength,Decreased activity tolerance  Visit Diagnosis: Difficulty in walking, not elsewhere classified  Muscle weakness (generalized)  Acute pain of right knee     Problem List Patient Active Problem List   Diagnosis Date Noted  . S/P total knee arthroplasty, right 01/23/2021  . IIH (idiopathic intracranial hypertension) 10/14/2020  . Discoloration of skin of finger 09/12/2020  . Bilateral hand pain 09/12/2020  . Paresthesia of skin 09/12/2020  . Chronic migraine without aura without status migrainosus, not intractable 06/22/2020  . Iron deficiency anemia 06/20/2019  . S/P left TKA 06/06/2019  . Status post total left knee replacement 06/06/2019  . Special screening for malignant neoplasms, colon 03/28/2019  . LLQ abdominal pain 09/26/2018  . Derangement of anterior horn of lateral meniscus of left knee   . Derangement of posterior horn of medial meniscus of left knee   . Chest pain 04/25/2016  . Hypokalemia 04/25/2016  . Hyperglycemia 04/25/2016  . Diverticulitis of colon 07/17/2014  . Nausea without vomiting 07/17/2014  . Crohn disease ([place]) 07/17/2014  . History of arthroscopy of right knee 07/25/2012  . Difficulty in walking(719.7) 06/29/2012  . Weakness of right leg 06/29/2012  . Posterior tibial tendonitis 11/11/2011  . PTTD (posterior tibial tendon dysfunction) 11/11/2011  . Knee  pain, right 11/11/2011  . Chest pain 07/16/2011  . Primary osteoarthritis of left knee 02/25/2010  . PAIN IN JOINT, MULTIPLE SITES 02/25/2010  . Essential hypertension 08/13/2009  . HIATAL HERNIA 08/13/2009  . PALPITATIONS 08/13/2009  . DYSPNEA 08/13/2009    3:08 PM, 02/03/21 Jerene Pitch, DPT Physical Therapy with [facility]  [phone] office   [facility] [address] [place], [place], [address] Phone: [phone]   Fax:  [phone]  Name: [name] MRN: [id number] Date of Birth: 12/05/1965

## 2021-02-05 ENCOUNTER — Other Ambulatory Visit: Payer: Self-pay

## 2021-02-05 ENCOUNTER — Other Ambulatory Visit (HOSPITAL_COMMUNITY): Payer: Self-pay

## 2021-02-05 ENCOUNTER — Ambulatory Visit (HOSPITAL_COMMUNITY): Payer: 59 | Admitting: Physical Therapy

## 2021-02-05 ENCOUNTER — Encounter (HOSPITAL_COMMUNITY): Payer: Self-pay | Admitting: Physical Therapy

## 2021-02-05 DIAGNOSIS — M6281 Muscle weakness (generalized): Secondary | ICD-10-CM

## 2021-02-05 DIAGNOSIS — M25561 Pain in right knee: Secondary | ICD-10-CM | POA: Diagnosis not present

## 2021-02-05 DIAGNOSIS — R262 Difficulty in walking, not elsewhere classified: Secondary | ICD-10-CM | POA: Diagnosis not present

## 2021-02-05 DIAGNOSIS — M25662 Stiffness of left knee, not elsewhere classified: Secondary | ICD-10-CM | POA: Diagnosis not present

## 2021-02-05 MED FILL — Losartan Potassium Tab 100 MG: ORAL | 90 days supply | Qty: 90 | Fill #0 | Status: AC

## 2021-02-05 MED FILL — Acetazolamide Cap ER 12HR 500 MG: ORAL | 90 days supply | Qty: 180 | Fill #0 | Status: AC

## 2021-02-05 NOTE — Therapy (Signed)
[facility] [address] [place], [place], [address] Phone: [phone]   Fax:  [phone]  Physical Therapy Treatment  Patient Details  Name: [name] MRN: [id number] Date of Birth: 01-09-1966 Referring Provider (PT): Paralee Cancel, MD   Encounter Date: 02/05/2021   PT End of Session - 02/05/21 1312    Visit Number 4    Number of Visits 18    Date for PT Re-Evaluation 03/10/21    Authorization Type [employer] - no auth or VL but review at the 25th visit    Authorization - Visit Number 4    Authorization - Number of Visits 25    Progress Note Due on Visit 10    PT Start Time 1314    PT Stop Time 1352    PT Time Calculation (min) 38 min    Equipment Utilized During Treatment Gait belt    Activity Tolerance Patient tolerated treatment well    Behavior During Therapy [facility] for tasks assessed/performed           Past Medical History:  Diagnosis Date  . Anxiety   . Arthritis   . Asthma   . Cervical hyperplasia    hisotry of  . Chest pain    cath 2005 norm cors, repeat cath Feb 2011 normal cors and only minimally  elevated pulmonary pressures  . Crohn disease ([place])   . Diverticulitis of colon 07/17/2014  . Diverticulosis   . Dyspnea   . GERD (gastroesophageal reflux disease)   . Hiatal hernia   . History of iron deficiency anemia   . Hypertension   . Idiopathic intracranial hypertension    Per patient  . Migraines   . Obesity   . Palpitations   . Pneumonia 2008   14 days hospitalization, bilateral  . PONV (postoperative nausea and vomiting)   . Sleep apnea sleep study 11/03/2009    cpap; does sometimes, doesnt use every night. weight loss no longer using CPAP  . Vitamin B deficiency     Past Surgical History:  Procedure Laterality Date  . ABDOMINAL HYSTERECTOMY    . ANKLE ARTHROSCOPY  06/01/2012   Procedure: ANKLE ARTHROSCOPY;  Surgeon: Colin Rhein, MD;  Location: [place];  Service:  Orthopedics;  Laterality: Left;  left ankle arthorsocpy with extensive debridement and gastroc slide  . ANKLE SURGERY Left 05/11/2018  . BIOPSY  04/26/2019   Procedure: BIOPSY;  Surgeon: Rogene Houston, MD;  [facility];  Service: Endoscopy;;  ileum colon  . CARDIAC CATHETERIZATION  11/22/2009 and 2005   WNL  . CESAREAN SECTION    . CHOLECYSTECTOMY  09/08/2006   lap. chole.  . CHONDROPLASTY  06/17/2012   Procedure: CHONDROPLASTY;  Surgeon: Carole Civil, MD;  [facility];  Service: Orthopedics;  Laterality: Right;  right patella  . COLONOSCOPY N/A 04/26/2019   Procedure: COLONOSCOPY;  Surgeon: Rogene Houston, MD;  [facility];  Service: Endoscopy;  Laterality: N/A;  100  . ESOPHAGOGASTRODUODENOSCOPY N/A 05/14/2015   Procedure: ESOPHAGOGASTRODUODENOSCOPY (EGD);  Surgeon: Rogene Houston, MD;  [facility];  Service: Endoscopy;  Laterality: N/A;  730  . GIVENS CAPSULE STUDY N/A 05/24/2019   Procedure: GIVENS CAPSULE STUDY;  Surgeon: Rogene Houston, MD;  [facility];  Service: Endoscopy;  Laterality: N/A;  7:30  . INGUINAL HERNIA REPAIR  10/30/2008   right  . KNEE ARTHROSCOPY Right 2013  . KNEE  ARTHROSCOPY WITH LATERAL MENISECTOMY Left 12/23/2016   Procedure: LEFT KNEE ARTHROSCOPY WITH LATERAL MENISECTOMY;  Surgeon: Carole Civil, MD;  [facility];  Service: Orthopedics;  Laterality: Left;  . KNEE ARTHROSCOPY WITH MEDIAL MENISECTOMY Left 12/23/2016   Procedure: LEFT KNEE ARTHROSCOPY WITH MEDIAL MENISECTOMY CHONDROPLASTY PATELLA  AND MEDIAL FEMORAL CONDYLE LEFT KNEE;  Surgeon: Carole Civil, MD;  [facility];  Service: Orthopedics;  Laterality: Left;  . SHOULDER ARTHROSCOPY WITH SUBACROMIAL DECOMPRESSION AND BICEP TENDON REPAIR Left 12/21/2017   Procedure: Left shoulder arthroscopic biceps tenodesis, SAD, DCR and labrum debridement;  Surgeon: Nicholes Stairs, MD;  Location: [place];  Service: Orthopedics;  Laterality:  Left;  120 mins  . SHOULDER SURGERY     right x 2   . TOTAL KNEE ARTHROPLASTY Left 06/06/2019   Procedure: TOTAL KNEE ARTHROPLASTY;  Surgeon: Paralee Cancel, MD;  [facility];  Service: Orthopedics;  Laterality: Left;  70 mins  . TOTAL KNEE ARTHROPLASTY Right 01/23/2021   Procedure: TOTAL KNEE ARTHROPLASTY;  Surgeon: Paralee Cancel, MD;  [facility];  Service: Orthopedics;  Laterality: Right;  70 mins    There were no vitals filed for this visit.   Subjective Assessment - 02/05/21 1313    Subjective Patient states her knee is feeling a lot better today. She needs to climb stairs for church. She has been elevating and icing.    Pertinent History previous knee surgery    Patient Stated Goals to be able to walk without waler    Currently in Pain? Yes    Pain Score 4     Pain Location Knee    Pain Orientation Right    Pain Descriptors / Indicators Aching    Pain Type Surgical pain    Pain Onset In the past 7 days    Pain Frequency Constant                             [facility] Adult PT Treatment/Exercise - 02/05/21 0001      Knee/Hip Exercises: Aerobic   Stationary Bike rocking back and forth 5 minutes with cueing for holds at end range flexion      Knee/Hip Exercises: Standing   Other Standing Knee Exercises stair training step to pattern 7 inch step      Knee/Hip Exercises: Seated   Long Arc Quad 10 reps;Right    Long Arc Quad Limitations 5 second holds      Knee/Hip Exercises: Supine   Heel Slides AAROM;Right;5 reps    Heel Slides Limitations 10 second holds    Straight Leg Raises Right;10 reps;1 set    Straight Leg Raises Limitations with quad set    Knee Extension AROM    Knee Extension Limitations 0    Knee Flexion AROM    Knee Flexion Limitations 80 degrees                  PT Education - 02/05/21 1313    Education Details HEP, exercise mechanics, stair mechanics    Person(s) Educated Patient    Methods Explanation;Demonstration     Comprehension Verbalized understanding;Returned demonstration            PT Short Term Goals - 01/27/21 1313      PT SHORT TERM GOAL #1   Title Patient will be independent in self management strategies to improve quality of life and functional outcomes.    Time 3  Period Weeks    Status New    Target Date 02/17/21      PT SHORT TERM GOAL #2   Title Patient will report at least 50% improvement in overall symptoms and/or function to demonstrate improved functional mobility    Time 3    Period Weeks    Status New    Target Date 02/17/21      PT SHORT TERM GOAL #3   Title Patient will be able to ambualte at least 226 feet with least restrictive assistive device in 2 minutes to demonstrate improved walking endurance.    Time 3    Period Weeks    Status New    Target Date 02/17/21      PT SHORT TERM GOAL #4   Title Patient will achieve 0-100 degrees in right knee to demonstrate improved knee motion    Time 3    Period Weeks    Status New    Target Date 02/17/21             PT Long Term Goals - 01/27/21 1313      PT LONG TERM GOAL #1   Title Patient will report at least 75% improvement in overall symptoms and/or function to demonstrate improved functional mobility    Time 6    Period Weeks    Status New    Target Date 03/10/21      PT LONG TERM GOAL #2   Title Patient will improve on FOTO score to meet predicted outcomes to demonstrate improved functional mobility.    Time 6    Period Weeks    Status New    Target Date 03/10/21      PT LONG TERM GOAL #3   Title Patient will be able to ambualte at least 226 feet without assistive device in 2 minutes to demonstrate improved walking endurance.    Time 6    Period Weeks    Status New    Target Date 03/10/21      PT LONG TERM GOAL #4   Title Patient will achieve 0-110 degrees in right knee to demonstrate improved knee motion    Time 6    Period Weeks    Status New    Target Date 03/10/21                  Plan - 02/05/21 1312    Clinical Impression Statement Patient begins session on bike for dynamic warm up and ROM with cueing for holds at end range. Patient ROM from 0 to 80 following bike which improves to 86 degrees following heel slides. Patient demonstrating extensor lag with SLR secondary to impaired quad strength and control which increases with fatigue despite cueing. Patient shown how to complete steps for church at end of session. Patient will continue to benefit from skilled physical therapy in order to reduce impairment and improve function.    Personal Factors and Comorbidities Comorbidity 1    Comorbidities hx of rigth knee surgery    Examination-Activity Limitations Bathing;Sleep;Bed Mobility;Squat;Stairs;Stand;Transfers;Locomotion Level;Bend;Sit    Examination-Participation Restrictions Cleaning;Community Activity;Driving;Meal Prep;Occupation    Stability/Clinical Decision Making Stable/Uncomplicated    Rehab Potential Good    PT Frequency 3x / week    PT Duration 6 weeks    PT Treatment/Interventions ADLs/Self Care Home Management;Aquatic Therapy;Cryotherapy;Electrical Stimulation;Moist Heat;Traction;Balance training;Therapeutic exercise;Therapeutic activities;Functional mobility training;Stair training;Gait training;DME Instruction;Neuromuscular re-education;Patient/family education;Manual techniques;Dry needling;Passive range of motion;Joint Manipulations    PT Next Visit Plan pain management strategies, edema management,  quad activation, knee ROM    PT Home Exercise Plan elevation, compression garments, heel slides 5/4 LAQ, SLR, heel slides    Consulted and Agree with Plan of Care Patient           Patient will benefit from skilled therapeutic intervention in order to improve the following deficits and impairments:  Abnormal gait,Decreased endurance,Decreased skin integrity,Pain,Increased edema,Decreased scar mobility,Decreased knowledge of precautions,Decreased  balance,Decreased mobility,Difficulty walking,Decreased range of motion,Decreased strength,Decreased activity tolerance  Visit Diagnosis: Difficulty in walking, not elsewhere classified  Muscle weakness (generalized)  Acute pain of right knee     Problem List Patient Active Problem List   Diagnosis Date Noted  . S/P total knee arthroplasty, right 01/23/2021  . IIH (idiopathic intracranial hypertension) 10/14/2020  . Discoloration of skin of finger 09/12/2020  . Bilateral hand pain 09/12/2020  . Paresthesia of skin 09/12/2020  . Chronic migraine without aura without status migrainosus, not intractable 06/22/2020  . Iron deficiency anemia 06/20/2019  . S/P left TKA 06/06/2019  . Status post total left knee replacement 06/06/2019  . Special screening for malignant neoplasms, colon 03/28/2019  . LLQ abdominal pain 09/26/2018  . Derangement of anterior horn of lateral meniscus of left knee   . Derangement of posterior horn of medial meniscus of left knee   . Chest pain 04/25/2016  . Hypokalemia 04/25/2016  . Hyperglycemia 04/25/2016  . Diverticulitis of colon 07/17/2014  . Nausea without vomiting 07/17/2014  . Crohn disease ([place]) 07/17/2014  . History of arthroscopy of right knee 07/25/2012  . Difficulty in walking(719.7) 06/29/2012  . Weakness of right leg 06/29/2012  . Posterior tibial tendonitis 11/11/2011  . PTTD (posterior tibial tendon dysfunction) 11/11/2011  . Knee pain, right 11/11/2011  . Chest pain 07/16/2011  . Primary osteoarthritis of left knee 02/25/2010  . PAIN IN JOINT, MULTIPLE SITES 02/25/2010  . Essential hypertension 08/13/2009  . HIATAL HERNIA 08/13/2009  . PALPITATIONS 08/13/2009  . DYSPNEA 08/13/2009    1:56 PM, 02/05/21 Mearl Latin PT, DPT Physical Therapist at [facility] [place], [place], [address] Phone: [phone]   Fax:   [phone]  Name: [name] MRN: [id number] Date of Birth: 06/06/1966

## 2021-02-05 NOTE — Patient Instructions (Signed)
Access Code: [email or web address] URL: [email or web address] Date: 02/05/2021 Prepared by: Mitzi Hansen Todrick Siedschlag  Exercises Supine Heel Slide with Strap - 3 x daily - 7 x weekly - 5 reps - 10 second hold Active Straight Leg Raise with Quad Set - 1 x daily - 7 x weekly - 10 reps Seated Long Arc Quad - 1 x daily - 7 x weekly - 10 reps - 5 second hold

## 2021-02-06 ENCOUNTER — Other Ambulatory Visit (HOSPITAL_COMMUNITY): Payer: Self-pay

## 2021-02-06 MED ORDER — CYCLOBENZAPRINE HCL 5 MG PO TABS
ORAL_TABLET | ORAL | 0 refills | Status: DC
  Filled 2021-02-06: qty 40, 14d supply, fill #0

## 2021-02-06 MED ORDER — OXYCODONE HCL 5 MG PO TABS
ORAL_TABLET | ORAL | 0 refills | Status: DC
  Filled 2021-02-06: qty 42, 5d supply, fill #0

## 2021-02-07 ENCOUNTER — Other Ambulatory Visit: Payer: Self-pay

## 2021-02-07 ENCOUNTER — Ambulatory Visit (HOSPITAL_COMMUNITY): Payer: 59

## 2021-02-07 ENCOUNTER — Other Ambulatory Visit (HOSPITAL_COMMUNITY): Payer: Self-pay

## 2021-02-07 ENCOUNTER — Encounter (HOSPITAL_COMMUNITY): Payer: Self-pay

## 2021-02-07 DIAGNOSIS — M6281 Muscle weakness (generalized): Secondary | ICD-10-CM

## 2021-02-07 DIAGNOSIS — M25662 Stiffness of left knee, not elsewhere classified: Secondary | ICD-10-CM | POA: Diagnosis not present

## 2021-02-07 DIAGNOSIS — R262 Difficulty in walking, not elsewhere classified: Secondary | ICD-10-CM | POA: Diagnosis not present

## 2021-02-07 DIAGNOSIS — M25561 Pain in right knee: Secondary | ICD-10-CM

## 2021-02-07 NOTE — Therapy (Signed)
[facility], [place], [address] Phone: [phone]   Fax:  [phone]  Physical Therapy Treatment  Patient Details  Name: GIANNA CALEF MRN: [id number] Date of Birth: Jun 16, 1966 Referring Provider (PT): Paralee Cancel, MD   Encounter Date: 02/07/2021   PT End of Session - 02/07/21 1143    Visit Number 5    Number of Visits 18    Date for PT Re-Evaluation 03/10/21    Authorization Type [employer] - no auth or VL but review at the 25th visit    Authorization - Visit Number 5    Authorization - Number of Visits 25    Progress Note Due on Visit 10    PT Start Time 1136    PT Stop Time 1220    PT Time Calculation (min) 44 min    Activity Tolerance Patient tolerated treatment well    Behavior During Therapy [facility] for tasks assessed/performed           Past Medical History:  Diagnosis Date  . Anxiety   . Arthritis   . Asthma   . Cervical hyperplasia    hisotry of  . Chest pain    cath 2005 norm cors, repeat cath Feb 2011 normal cors and only minimally  elevated pulmonary pressures  . Crohn disease ([place])   . Diverticulitis of colon 07/17/2014  . Diverticulosis   . Dyspnea   . GERD (gastroesophageal reflux disease)   . Hiatal hernia   . History of iron deficiency anemia   . Hypertension   . Idiopathic intracranial hypertension    Per patient  . Migraines   . Obesity   . Palpitations   . Pneumonia 2008   14 days hospitalization, bilateral  . PONV (postoperative nausea and vomiting)   . Sleep apnea sleep study 11/03/2009    cpap; does sometimes, doesnt use every night. weight loss no longer using CPAP  . Vitamin B deficiency     Past Surgical History:  Procedure Laterality Date  . ABDOMINAL HYSTERECTOMY    . ANKLE ARTHROSCOPY  06/01/2012   Procedure: ANKLE ARTHROSCOPY;  Surgeon: Colin Rhein, MD;  Location: [place];  Service: Orthopedics;  Laterality: Left;  left ankle  arthorsocpy with extensive debridement and gastroc slide  . ANKLE SURGERY Left 05/11/2018  . BIOPSY  04/26/2019   Procedure: BIOPSY;  Surgeon: Rogene Houston, MD;  [facility];  Service: Endoscopy;;  ileum colon  . CARDIAC CATHETERIZATION  11/22/2009 and 2005   WNL  . CESAREAN SECTION    . CHOLECYSTECTOMY  09/08/2006   lap. chole.  . CHONDROPLASTY  06/17/2012   Procedure: CHONDROPLASTY;  Surgeon: Carole Civil, MD;  [facility];  Service: Orthopedics;  Laterality: Right;  right patella  . COLONOSCOPY N/A 04/26/2019   Procedure: COLONOSCOPY;  Surgeon: Rogene Houston, MD;  [facility];  Service: Endoscopy;  Laterality: N/A;  100  . ESOPHAGOGASTRODUODENOSCOPY N/A 05/14/2015   Procedure: ESOPHAGOGASTRODUODENOSCOPY (EGD);  Surgeon: Rogene Houston, MD;  [facility];  Service: Endoscopy;  Laterality: N/A;  730  . GIVENS CAPSULE STUDY N/A 05/24/2019   Procedure: GIVENS CAPSULE STUDY;  Surgeon: Rogene Houston, MD;  [facility];  Service: Endoscopy;  Laterality: N/A;  7:30  . INGUINAL HERNIA REPAIR  10/30/2008   right  . KNEE ARTHROSCOPY Right 2013  . KNEE ARTHROSCOPY WITH LATERAL MENISECTOMY Left 12/23/2016   Procedure:  LEFT KNEE ARTHROSCOPY WITH LATERAL MENISECTOMY;  Surgeon: Carole Civil, MD;  [facility];  Service: Orthopedics;  Laterality: Left;  . KNEE ARTHROSCOPY WITH MEDIAL MENISECTOMY Left 12/23/2016   Procedure: LEFT KNEE ARTHROSCOPY WITH MEDIAL MENISECTOMY CHONDROPLASTY PATELLA  AND MEDIAL FEMORAL CONDYLE LEFT KNEE;  Surgeon: Carole Civil, MD;  [facility];  Service: Orthopedics;  Laterality: Left;  . SHOULDER ARTHROSCOPY WITH SUBACROMIAL DECOMPRESSION AND BICEP TENDON REPAIR Left 12/21/2017   Procedure: Left shoulder arthroscopic biceps tenodesis, SAD, DCR and labrum debridement;  Surgeon: Nicholes Stairs, MD;  Location: [facility];  Service: Orthopedics;  Laterality: Left;  120 mins  . SHOULDER SURGERY      right x 2   . TOTAL KNEE ARTHROPLASTY Left 06/06/2019   Procedure: TOTAL KNEE ARTHROPLASTY;  Surgeon: Paralee Cancel, MD;  [facility];  Service: Orthopedics;  Laterality: Left;  70 mins  . TOTAL KNEE ARTHROPLASTY Right 01/23/2021   Procedure: TOTAL KNEE ARTHROPLASTY;  Surgeon: Paralee Cancel, MD;  [facility];  Service: Orthopedics;  Laterality: Right;  70 mins    There were no vitals filed for this visit.   Subjective Assessment - 02/07/21 1136    Subjective Knee is really achey today with throbbing pain from sitting in car during ride.  Went to MD yesterday and happy wiht progress.  Return in 4 weeks.    Pertinent History previous knee surgery    Patient Stated Goals to be able to walk without waler    Currently in Pain? Yes    Pain Score 4     Pain Location Knee    Pain Orientation Right    Pain Descriptors / Indicators Aching;Throbbing    Pain Type Surgical pain    Pain Onset 1 to 4 weeks ago    Pain Frequency Constant    Aggravating Factors  bending, walking    Pain Relieving Factors rest, medication              [facility] PT Assessment - 02/07/21 0001      Assessment   Medical Diagnosis R TKA    Referring Provider (PT) Paralee Cancel, MD    Onset Date/Surgical Date 01/23/21    Hand Dominance Right    Next MD Visit 03/07/21    Prior Therapy in hospital                         [facility] Adult PT Treatment/Exercise - 02/07/21 0001      Knee/Hip Exercises: Stretches   Knee: Self-Stretch to increase Flexion 5 reps;10 seconds;Right    Knee: Self-Stretch Limitations knee drive on 80DX step 5x 10" for flexion      Knee/Hip Exercises: Aerobic   Stationary Bike rocking back and forth 5 minutes with cueing for holds at end range flexion seat 21      Knee/Hip Exercises: Seated   Long Arc Quad 10 reps;Right    Long Arc Quad Limitations 10" holds      Knee/Hip Exercises: Supine   Quad Sets Strengthening;Right;2 sets;10 reps    [employer] Limitations 5" holds     Heel Slides AROM;AAROM;2 sets;10 reps   AAROM with rope   Heel Slides Limitations 10 second holds    Straight Leg Raises Right;10 reps;1 set    Straight Leg Raises Limitations with quad set    Knee Extension AROM    Knee Extension Limitations 0    Knee Flexion AROM    Knee Flexion  Limitations 87 degrees      Manual Therapy   Manual Therapy Edema management    Manual therapy comments Manual complete separate than rest of tx    Edema Management Manual decongestive techniques with LE elevated                    PT Short Term Goals - 01/27/21 1313      PT SHORT TERM GOAL #1   Title Patient will be independent in self management strategies to improve quality of life and functional outcomes.    Time 3    Period Weeks    Status New    Target Date 02/17/21      PT SHORT TERM GOAL #2   Title Patient will report at least 50% improvement in overall symptoms and/or function to demonstrate improved functional mobility    Time 3    Period Weeks    Status New    Target Date 02/17/21      PT SHORT TERM GOAL #3   Title Patient will be able to ambualte at least 226 feet with least restrictive assistive device in 2 minutes to demonstrate improved walking endurance.    Time 3    Period Weeks    Status New    Target Date 02/17/21      PT SHORT TERM GOAL #4   Title Patient will achieve 0-100 degrees in right knee to demonstrate improved knee motion    Time 3    Period Weeks    Status New    Target Date 02/17/21             PT Long Term Goals - 01/27/21 1313      PT LONG TERM GOAL #1   Title Patient will report at least 75% improvement in overall symptoms and/or function to demonstrate improved functional mobility    Time 6    Period Weeks    Status New    Target Date 03/10/21      PT LONG TERM GOAL #2   Title Patient will improve on FOTO score to meet predicted outcomes to demonstrate improved functional mobility.    Time 6    Period Weeks    Status New    Target  Date 03/10/21      PT LONG TERM GOAL #3   Title Patient will be able to ambualte at least 226 feet without assistive device in 2 minutes to demonstrate improved walking endurance.    Time 6    Period Weeks    Status New    Target Date 03/10/21      PT LONG TERM GOAL #4   Title Patient will achieve 0-110 degrees in right knee to demonstrate improved knee motion    Time 6    Period Weeks    Status New    Target Date 03/10/21                 Plan - 02/07/21 1209    Clinical Impression Statement Began session with manual to address edema prior ROM based exercises.  Session focus wiht knee mobility and quad strengthening.  Min cueing for quad set prior raise to reduce extension lag with SLR.  Added knee drive for flexion based stretch.  Improved AROM 0-87 degrees.    Personal Factors and Comorbidities Comorbidity 1    Comorbidities hx of rigth knee surgery    Examination-Activity Limitations Bathing;Sleep;Bed Mobility;Squat;Stairs;Stand;Transfers;Locomotion Level;Bend;Sit    Examination-Participation Restrictions Cleaning;Community Activity;Driving;Meal Prep;Occupation  Stability/Clinical Decision Making Stable/Uncomplicated    Clinical Decision Making Low    Rehab Potential Good    PT Frequency 3x / week    PT Duration 6 weeks    PT Treatment/Interventions ADLs/Self Care Home Management;Aquatic Therapy;Cryotherapy;Electrical Stimulation;Moist Heat;Traction;Balance training;Therapeutic exercise;Therapeutic activities;Functional mobility training;Stair training;Gait training;DME Instruction;Neuromuscular re-education;Patient/family education;Manual techniques;Dry needling;Passive range of motion;Joint Manipulations    PT Next Visit Plan pain management strategies, edema management, quad activation, knee ROM    PT Home Exercise Plan elevation, compression garments, heel slides 5/4 LAQ, SLR, heel slides    Consulted and Agree with Plan of Care Patient           Patient will  benefit from skilled therapeutic intervention in order to improve the following deficits and impairments:  Abnormal gait,Decreased endurance,Decreased skin integrity,Pain,Increased edema,Decreased scar mobility,Decreased knowledge of precautions,Decreased balance,Decreased mobility,Difficulty walking,Decreased range of motion,Decreased strength,Decreased activity tolerance  Visit Diagnosis: Difficulty in walking, not elsewhere classified  Muscle weakness (generalized)  Acute pain of right knee     Problem List Patient Active Problem List   Diagnosis Date Noted  . S/P total knee arthroplasty, right 01/23/2021  . IIH (idiopathic intracranial hypertension) 10/14/2020  . Discoloration of skin of finger 09/12/2020  . Bilateral hand pain 09/12/2020  . Paresthesia of skin 09/12/2020  . Chronic migraine without aura without status migrainosus, not intractable 06/22/2020  . Iron deficiency anemia 06/20/2019  . S/P left TKA 06/06/2019  . Status post total left knee replacement 06/06/2019  . Special screening for malignant neoplasms, colon 03/28/2019  . LLQ abdominal pain 09/26/2018  . Derangement of anterior horn of lateral meniscus of left knee   . Derangement of posterior horn of medial meniscus of left knee   . Chest pain 04/25/2016  . Hypokalemia 04/25/2016  . Hyperglycemia 04/25/2016  . Diverticulitis of colon 07/17/2014  . Nausea without vomiting 07/17/2014  . Crohn disease ([place]) 07/17/2014  . History of arthroscopy of right knee 07/25/2012  . Difficulty in walking(719.7) 06/29/2012  . Weakness of right leg 06/29/2012  . Posterior tibial tendonitis 11/11/2011  . PTTD (posterior tibial tendon dysfunction) 11/11/2011  . Knee pain, right 11/11/2011  . Chest pain 07/16/2011  . Primary osteoarthritis of left knee 02/25/2010  . PAIN IN JOINT, MULTIPLE SITES 02/25/2010  . Essential hypertension 08/13/2009  . HIATAL HERNIA 08/13/2009  . PALPITATIONS 08/13/2009  . DYSPNEA 08/13/2009    Ihor Austin, LPTA/CLT; CBIS [phone]  Aldona Lento 02/07/2021, 1:04 PM  [facility] [address] [place], [place], [address] Phone: [phone]   Fax:  [phone]  Name: VARETTA CHAVERS MRN: [id number] Date of Birth: 10-Dec-1965

## 2021-02-10 ENCOUNTER — Telehealth: Payer: Self-pay

## 2021-02-10 ENCOUNTER — Ambulatory Visit (HOSPITAL_COMMUNITY): Payer: 59

## 2021-02-10 ENCOUNTER — Other Ambulatory Visit: Payer: Self-pay

## 2021-02-10 DIAGNOSIS — R262 Difficulty in walking, not elsewhere classified: Secondary | ICD-10-CM | POA: Diagnosis not present

## 2021-02-10 DIAGNOSIS — M6281 Muscle weakness (generalized): Secondary | ICD-10-CM

## 2021-02-10 DIAGNOSIS — M25662 Stiffness of left knee, not elsewhere classified: Secondary | ICD-10-CM | POA: Diagnosis not present

## 2021-02-10 DIAGNOSIS — M25561 Pain in right knee: Secondary | ICD-10-CM | POA: Diagnosis not present

## 2021-02-10 NOTE — Telephone Encounter (Signed)
Rescheduled appt to 03-11-21 at 0830.  Appreciated call back.

## 2021-02-10 NOTE — Therapy (Signed)
[facility] [address] [place], [place], [address] Phone: [phone]   Fax:  [phone]  Physical Therapy Treatment  Patient Details  Name: [name] MRN: [id number] Date of Birth: [birth year] Referring Provider (PT): Paralee Cancel, MD   Encounter Date: 02/10/2021   PT End of Session - 02/10/21 1316    Visit Number 6    Number of Visits 18    Date for PT Re-Evaluation 03/10/21    Authorization Type [employer] - no auth or VL but review at the 25th visit    Authorization - Visit Number 6    Authorization - Number of Visits 25    Progress Note Due on Visit 10    PT Start Time 1310    PT Stop Time 1355    PT Time Calculation (min) 45 min    Equipment Utilized During Treatment Gait belt    Activity Tolerance Patient tolerated treatment well    Behavior During Therapy [facility] for tasks assessed/performed           Past Medical History:  Diagnosis Date  . Anxiety   . Arthritis   . Asthma   . Cervical hyperplasia    hisotry of  . Chest pain    cath 2005 norm cors, repeat cath Feb 2011 normal cors and only minimally  elevated pulmonary pressures  . Crohn disease ([place])   . Diverticulitis of colon 07/17/2014  . Diverticulosis   . Dyspnea   . GERD (gastroesophageal reflux disease)   . Hiatal hernia   . History of iron deficiency anemia   . Hypertension   . Idiopathic intracranial hypertension    Per patient  . Migraines   . Obesity   . Palpitations   . Pneumonia 2008   14 days hospitalization, bilateral  . PONV (postoperative nausea and vomiting)   . Sleep apnea sleep study 11/03/2009    cpap; does sometimes, doesnt use every night. weight loss no longer using CPAP  . Vitamin B deficiency     Past Surgical History:  Procedure Laterality Date  . ABDOMINAL HYSTERECTOMY    . ANKLE ARTHROSCOPY  06/01/2012   Procedure: ANKLE ARTHROSCOPY;  Surgeon: Colin Rhein, MD;  Location: [facility];  Service:  Orthopedics;  Laterality: Left;  left ankle arthorsocpy with extensive debridement and gastroc slide  . ANKLE SURGERY Left 05/11/2018  . BIOPSY  04/26/2019   Procedure: BIOPSY;  Surgeon: Rogene Houston, MD;  [facility];  Service: Endoscopy;;  ileum colon  . CARDIAC CATHETERIZATION  11/22/2009 and 2005   WNL  . CESAREAN SECTION    . CHOLECYSTECTOMY  09/08/2006   lap. chole.  . CHONDROPLASTY  06/17/2012   Procedure: CHONDROPLASTY;  Surgeon: Carole Civil, MD;  [facility];  Service: Orthopedics;  Laterality: Right;  right patella  . COLONOSCOPY N/A 04/26/2019   Procedure: COLONOSCOPY;  Surgeon: Rogene Houston, MD;  [facility];  Service: Endoscopy;  Laterality: N/A;  100  . ESOPHAGOGASTRODUODENOSCOPY N/A 05/14/2015   Procedure: ESOPHAGOGASTRODUODENOSCOPY (EGD);  Surgeon: Rogene Houston, MD;  [facility];  Service: Endoscopy;  Laterality: N/A;  730  . GIVENS CAPSULE STUDY N/A 05/24/2019   Procedure: GIVENS CAPSULE STUDY;  Surgeon: Rogene Houston, MD;  [facility];  Service: Endoscopy;  Laterality: N/A;  7:30  . INGUINAL HERNIA REPAIR  10/30/2008   right  . KNEE ARTHROSCOPY Right 2013  . KNEE  ARTHROSCOPY WITH LATERAL MENISECTOMY Left 12/23/2016   Procedure: LEFT KNEE ARTHROSCOPY WITH LATERAL MENISECTOMY;  Surgeon: Carole Civil, MD;  [facility];  Service: Orthopedics;  Laterality: Left;  . KNEE ARTHROSCOPY WITH MEDIAL MENISECTOMY Left 12/23/2016   Procedure: LEFT KNEE ARTHROSCOPY WITH MEDIAL MENISECTOMY CHONDROPLASTY PATELLA  AND MEDIAL FEMORAL CONDYLE LEFT KNEE;  Surgeon: Carole Civil, MD;  [facility];  Service: Orthopedics;  Laterality: Left;  . SHOULDER ARTHROSCOPY WITH SUBACROMIAL DECOMPRESSION AND BICEP TENDON REPAIR Left 12/21/2017   Procedure: Left shoulder arthroscopic biceps tenodesis, SAD, DCR and labrum debridement;  Surgeon: Nicholes Stairs, MD;  Location: [facility];  Service: Orthopedics;  Laterality:  Left;  120 mins  . SHOULDER SURGERY     right x 2   . TOTAL KNEE ARTHROPLASTY Left 06/06/2019   Procedure: TOTAL KNEE ARTHROPLASTY;  Surgeon: Paralee Cancel, MD;  [facility];  Service: Orthopedics;  Laterality: Left;  70 mins  . TOTAL KNEE ARTHROPLASTY Right 01/23/2021   Procedure: TOTAL KNEE ARTHROPLASTY;  Surgeon: Paralee Cancel, MD;  [facility];  Service: Orthopedics;  Laterality: Right;  70 mins    There were no vitals filed for this visit.   Subjective Assessment - 02/10/21 1315    Subjective reports her right knee is feeling better but aching and she has been taking some steps at home without AD    Pertinent History previous knee surgery    Patient Stated Goals to be able to walk without waler    Currently in Pain? Yes    Pain Score 4     Pain Location Knee    Pain Orientation Right    Pain Descriptors / Indicators Aching;Throbbing    Pain Type Surgical pain    Pain Onset 1 to 4 weeks ago              [facility] PT Assessment - 02/10/21 0001      Assessment   Medical Diagnosis R TKA    Referring Provider (PT) Paralee Cancel, MD    Onset Date/Surgical Date 01/23/21    Hand Dominance Right    Next MD Visit 03/07/21    Prior Therapy in hospital                         [facility] Adult PT Treatment/Exercise - 02/10/21 0001      Knee/Hip Exercises: Aerobic   Stationary Bike rocking back and forth 5 minutes with cueing for holds at end range flexion seat 21      Knee/Hip Exercises: Standing   Knee Flexion Strengthening;Right;2 sets;10 reps    Knee Flexion Limitations 5 lbs    Other Standing Knee Exercises sidestepping x 2 min w/ 5 lbs ankle weights    Other Standing Knee Exercises stair taps x 2 min 5 lbs      Knee/Hip Exercises: Seated   Long Arc Quad Strengthening;Right;3 sets;10 reps    Long Arc Quad Weight 10 lbs.   1x10 with 5, 2x10 with 10 lbs   Other Seated Knee/Hip Exercises ankle PF/DF 30x    Hamstring Curl Strengthening;Right;3 sets;10 reps     Hamstring Limitations red band      Knee/Hip Exercises: Supine   Knee Extension AROM    Knee Extension Limitations 0    Knee Flexion AROM    Knee Flexion Limitations 93                    PT Short  Term Goals - 02/10/21 1400      PT SHORT TERM GOAL #1   Title Patient will be independent in self management strategies to improve quality of life and functional outcomes.    Time 3    Period Weeks    Status On-going    Target Date 02/17/21      PT SHORT TERM GOAL #2   Title Patient will report at least 50% improvement in overall symptoms and/or function to demonstrate improved functional mobility    Time 3    Period Weeks    Status On-going    Target Date 02/17/21      PT SHORT TERM GOAL #3   Title Patient will be able to ambualte at least 226 feet with least restrictive assistive device in 2 minutes to demonstrate improved walking endurance.    Time 3    Period Weeks    Status On-going    Target Date 02/17/21      PT SHORT TERM GOAL #4   Title Patient will achieve 0-100 degrees in right knee to demonstrate improved knee motion    Time 3    Period Weeks    Status On-going    Target Date 02/17/21             PT Long Term Goals - 01/27/21 1313      PT LONG TERM GOAL #1   Title Patient will report at least 75% improvement in overall symptoms and/or function to demonstrate improved functional mobility    Time 6    Period Weeks    Status New    Target Date 03/10/21      PT LONG TERM GOAL #2   Title Patient will improve on FOTO score to meet predicted outcomes to demonstrate improved functional mobility.    Time 6    Period Weeks    Status New    Target Date 03/10/21      PT LONG TERM GOAL #3   Title Patient will be able to ambualte at least 226 feet without assistive device in 2 minutes to demonstrate improved walking endurance.    Time 6    Period Weeks    Status New    Target Date 03/10/21      PT LONG TERM GOAL #4   Title Patient will achieve  0-110 degrees in right knee to demonstrate improved knee motion    Time 6    Period Weeks    Status New    Target Date 03/10/21                 Plan - 02/10/21 1346    Clinical Impression Statement Tolerating tx sessions well and progressing with increased resistance with PRE without adverse effects.  Continued POC indicated to progress right knee ROM and strength to enable ambulation with cane    Personal Factors and Comorbidities Comorbidity 1    Comorbidities hx of rigth knee surgery    Examination-Activity Limitations Bathing;Sleep;Bed Mobility;Squat;Stairs;Stand;Transfers;Locomotion Level;Bend;Sit    Examination-Participation Restrictions Cleaning;Community Activity;Driving;Meal Prep;Occupation    Stability/Clinical Decision Making Stable/Uncomplicated    Rehab Potential Good    PT Frequency 3x / week    PT Duration 6 weeks    PT Treatment/Interventions ADLs/Self Care Home Management;Aquatic Therapy;Cryotherapy;Electrical Stimulation;Moist Heat;Traction;Balance training;Therapeutic exercise;Therapeutic activities;Functional mobility training;Stair training;Gait training;DME Instruction;Neuromuscular re-education;Patient/family education;Manual techniques;Dry needling;Passive range of motion;Joint Manipulations    PT Next Visit Plan pain management strategies, edema management, quad activation, knee ROM    PT Home Exercise  Plan elevation, compression garments, heel slides 5/4 LAQ, SLR, heel slides    Consulted and Agree with Plan of Care Patient           Patient will benefit from skilled therapeutic intervention in order to improve the following deficits and impairments:  Abnormal gait,Decreased endurance,Decreased skin integrity,Pain,Increased edema,Decreased scar mobility,Decreased knowledge of precautions,Decreased balance,Decreased mobility,Difficulty walking,Decreased range of motion,Decreased strength,Decreased activity tolerance  Visit Diagnosis: Difficulty in  walking, not elsewhere classified  Muscle weakness (generalized)  Acute pain of right knee     Problem List Patient Active Problem List   Diagnosis Date Noted  . S/P total knee arthroplasty, right 01/23/2021  . IIH (idiopathic intracranial hypertension) 10/14/2020  . Discoloration of skin of finger 09/12/2020  . Bilateral hand pain 09/12/2020  . Paresthesia of skin 09/12/2020  . Chronic migraine without aura without status migrainosus, not intractable 06/22/2020  . Iron deficiency anemia 06/20/2019  . S/P left TKA 06/06/2019  . Status post total left knee replacement 06/06/2019  . Special screening for malignant neoplasms, colon 03/28/2019  . LLQ abdominal pain 09/26/2018  . Derangement of anterior horn of lateral meniscus of left knee   . Derangement of posterior horn of medial meniscus of left knee   . Chest pain 04/25/2016  . Hypokalemia 04/25/2016  . Hyperglycemia 04/25/2016  . Diverticulitis of colon 07/17/2014  . Nausea without vomiting 07/17/2014  . Crohn disease ([place]) 07/17/2014  . History of arthroscopy of right knee 07/25/2012  . Difficulty in walking(719.7) 06/29/2012  . Weakness of right leg 06/29/2012  . Posterior tibial tendonitis 11/11/2011  . PTTD (posterior tibial tendon dysfunction) 11/11/2011  . Knee pain, right 11/11/2011  . Chest pain 07/16/2011  . Primary osteoarthritis of left knee 02/25/2010  . PAIN IN JOINT, MULTIPLE SITES 02/25/2010  . Essential hypertension 08/13/2009  . HIATAL HERNIA 08/13/2009  . PALPITATIONS 08/13/2009  . DYSPNEA 08/13/2009   2:02 PM, 02/10/21 M. Sherlyn Lees, PT, DPT Physical Therapist- [place] Office Number: [phone]  [facility] [address] [place], [place], [address] Phone: [phone]   Fax:  [phone]  Name: RIM THATCH MRN: [id number] Date of Birth: 1966-05-27

## 2021-02-10 NOTE — Telephone Encounter (Signed)
Patient left a voicemail this morning asking for a call to reschedule her upcoming appointment with Amy, NP.  Please call.

## 2021-02-12 ENCOUNTER — Encounter (HOSPITAL_COMMUNITY): Payer: Self-pay | Admitting: Physical Therapy

## 2021-02-12 ENCOUNTER — Ambulatory Visit: Payer: 59 | Admitting: Family Medicine

## 2021-02-12 ENCOUNTER — Ambulatory Visit (HOSPITAL_COMMUNITY): Payer: 59 | Admitting: Physical Therapy

## 2021-02-12 ENCOUNTER — Other Ambulatory Visit: Payer: Self-pay

## 2021-02-12 DIAGNOSIS — M6281 Muscle weakness (generalized): Secondary | ICD-10-CM | POA: Diagnosis not present

## 2021-02-12 DIAGNOSIS — M25662 Stiffness of left knee, not elsewhere classified: Secondary | ICD-10-CM | POA: Diagnosis not present

## 2021-02-12 DIAGNOSIS — M25561 Pain in right knee: Secondary | ICD-10-CM

## 2021-02-12 DIAGNOSIS — R262 Difficulty in walking, not elsewhere classified: Secondary | ICD-10-CM | POA: Diagnosis not present

## 2021-02-12 NOTE — Therapy (Signed)
[facility] [address] [place], [place], [address] Phone: [phone]   Fax:  [phone]  Physical Therapy Treatment  Patient Details  Name: [name] MRN: [id number] Date of Birth: May 11, 1966 Referring Provider (PT): Paralee Cancel, MD   Encounter Date: 02/12/2021   PT End of Session - 02/12/21 1617    Visit Number 7    Number of Visits 18    Date for PT Re-Evaluation 03/10/21    Authorization Type [employer] - no auth or VL but review at the 25th visit    Authorization - Visit Number 7    Authorization - Number of Visits 25    Progress Note Due on Visit 10    PT Start Time 1616    PT Stop Time 1655    PT Time Calculation (min) 39 min    Equipment Utilized During Treatment Gait belt    Activity Tolerance Patient tolerated treatment well    Behavior During Therapy [facility] for tasks assessed/performed           Past Medical History:  Diagnosis Date  . Anxiety   . Arthritis   . Asthma   . Cervical hyperplasia    hisotry of  . Chest pain    cath 2005 norm cors, repeat cath Feb 2011 normal cors and only minimally  elevated pulmonary pressures  . Crohn disease ([place])   . Diverticulitis of colon 07/17/2014  . Diverticulosis   . Dyspnea   . GERD (gastroesophageal reflux disease)   . Hiatal hernia   . History of iron deficiency anemia   . Hypertension   . Idiopathic intracranial hypertension    Per patient  . Migraines   . Obesity   . Palpitations   . Pneumonia 2008   14 days hospitalization, bilateral  . PONV (postoperative nausea and vomiting)   . Sleep apnea sleep study 11/03/2009    cpap; does sometimes, doesnt use every night. weight loss no longer using CPAP  . Vitamin B deficiency     Past Surgical History:  Procedure Laterality Date  . ABDOMINAL HYSTERECTOMY    . ANKLE ARTHROSCOPY  06/01/2012   Procedure: ANKLE ARTHROSCOPY;  Surgeon: Colin Rhein, MD;  Location: [facility];  Service:  Orthopedics;  Laterality: Left;  left ankle arthorsocpy with extensive debridement and gastroc slide  . ANKLE SURGERY Left 05/11/2018  . BIOPSY  04/26/2019   Procedure: BIOPSY;  Surgeon: Rogene Houston, MD;  [facility];  Service: Endoscopy;;  ileum colon  . CARDIAC CATHETERIZATION  11/22/2009 and 2005   WNL  . CESAREAN SECTION    . CHOLECYSTECTOMY  09/08/2006   lap. chole.  . CHONDROPLASTY  06/17/2012   Procedure: CHONDROPLASTY;  Surgeon: Carole Civil, MD;  [facility];  Service: Orthopedics;  Laterality: Right;  right patella  . COLONOSCOPY N/A 04/26/2019   Procedure: COLONOSCOPY;  Surgeon: Rogene Houston, MD;  [facility];  Service: Endoscopy;  Laterality: N/A;  100  . ESOPHAGOGASTRODUODENOSCOPY N/A 05/14/2015   Procedure: ESOPHAGOGASTRODUODENOSCOPY (EGD);  Surgeon: Rogene Houston, MD;  [facility];  Service: Endoscopy;  Laterality: N/A;  730  . GIVENS CAPSULE STUDY N/A 05/24/2019   Procedure: GIVENS CAPSULE STUDY;  Surgeon: Rogene Houston, MD;  [facility];  Service: Endoscopy;  Laterality: N/A;  7:30  . INGUINAL HERNIA REPAIR  10/30/2008   right  . KNEE ARTHROSCOPY Right 2013  . KNEE  ARTHROSCOPY WITH LATERAL MENISECTOMY Left 12/23/2016   Procedure: LEFT KNEE ARTHROSCOPY WITH LATERAL MENISECTOMY;  Surgeon: Carole Civil, MD;  [facility];  Service: Orthopedics;  Laterality: Left;  . KNEE ARTHROSCOPY WITH MEDIAL MENISECTOMY Left 12/23/2016   Procedure: LEFT KNEE ARTHROSCOPY WITH MEDIAL MENISECTOMY CHONDROPLASTY PATELLA  AND MEDIAL FEMORAL CONDYLE LEFT KNEE;  Surgeon: Carole Civil, MD;  [facility];  Service: Orthopedics;  Laterality: Left;  . SHOULDER ARTHROSCOPY WITH SUBACROMIAL DECOMPRESSION AND BICEP TENDON REPAIR Left 12/21/2017   Procedure: Left shoulder arthroscopic biceps tenodesis, SAD, DCR and labrum debridement;  Surgeon: Nicholes Stairs, MD;  Location: [facility];  Service: Orthopedics;  Laterality:  Left;  120 mins  . SHOULDER SURGERY     right x 2   . TOTAL KNEE ARTHROPLASTY Left 06/06/2019   Procedure: TOTAL KNEE ARTHROPLASTY;  Surgeon: Paralee Cancel, MD;  [facility];  Service: Orthopedics;  Laterality: Left;  70 mins  . TOTAL KNEE ARTHROPLASTY Right 01/23/2021   Procedure: TOTAL KNEE ARTHROPLASTY;  Surgeon: Paralee Cancel, MD;  [facility];  Service: Orthopedics;  Laterality: Right;  70 mins    There were no vitals filed for this visit.   Subjective Assessment - 02/12/21 1623    Subjective reports she feels awkward with the cane in the left hand even though she knows that's where it should be. STates pain isn't that bad but she just took pain meds.    Pertinent History previous knee surgery    Patient Stated Goals to be able to walk without waler    Currently in Pain? Yes    Pain Score 4     Pain Location Knee    Pain Orientation Right    Pain Descriptors / Indicators Aching;Tightness    Pain Onset 1 to 4 weeks ago              [facility] PT Assessment - 02/12/21 0001      Assessment   Medical Diagnosis R TKA    Referring Provider (PT) Paralee Cancel, MD                         [facility] Adult PT Treatment/Exercise - 02/12/21 0001      Ambulation/Gait   Gait Comments walking with cane on left/right with verbal cues - 4 minutes      Knee/Hip Exercises: Aerobic   Stationary Bike rocking back and forth 5 minutes with cueing for holds at end range flexion seat 21      Knee/Hip Exercises: Standing   Forward Step Up Hand Hold: 2;Step Height: 4";Right;2 sets   12 reps, verbal cues to stand up tall throughout session     Knee/Hip Exercises: Supine   Knee Extension AROM    Knee Extension Limitations 2   lacking   Knee Flexion AROM    Knee Flexion Limitations 98    Other Supine Knee/Hip Exercises knee flexion adn extension with hip at 90 - 15 5-10" holds in each position                    PT Short Term Goals - 02/10/21 1400      PT SHORT  TERM GOAL #1   Title Patient will be independent in self management strategies to improve quality of life and functional outcomes.    Time 3    Period Weeks    Status On-going    Target Date 02/17/21  PT SHORT TERM GOAL #2   Title Patient will report at least 50% improvement in overall symptoms and/or function to demonstrate improved functional mobility    Time 3    Period Weeks    Status On-going    Target Date 02/17/21      PT SHORT TERM GOAL #3   Title Patient will be able to ambualte at least 226 feet with least restrictive assistive device in 2 minutes to demonstrate improved walking endurance.    Time 3    Period Weeks    Status On-going    Target Date 02/17/21      PT SHORT TERM GOAL #4   Title Patient will achieve 0-100 degrees in right knee to demonstrate improved knee motion    Time 3    Period Weeks    Status On-going    Target Date 02/17/21             PT Long Term Goals - 01/27/21 1313      PT LONG TERM GOAL #1   Title Patient will report at least 75% improvement in overall symptoms and/or function to demonstrate improved functional mobility    Time 6    Period Weeks    Status New    Target Date 03/10/21      PT LONG TERM GOAL #2   Title Patient will improve on FOTO score to meet predicted outcomes to demonstrate improved functional mobility.    Time 6    Period Weeks    Status New    Target Date 03/10/21      PT LONG TERM GOAL #3   Title Patient will be able to ambualte at least 226 feet without assistive device in 2 minutes to demonstrate improved walking endurance.    Time 6    Period Weeks    Status New    Target Date 03/10/21      PT LONG TERM GOAL #4   Title Patient will achieve 0-110 degrees in right knee to demonstrate improved knee motion    Time 6    Period Weeks    Status New    Target Date 03/10/21                 Plan - 02/12/21 1617    Clinical Impression Statement Tolerated session well. Increased knee flexion  noted during session. Added new knee ROM stretch to HEP. Able to perform step ups with fair form with verbal cues to stand up tall and required two hand support to perform. Assessed walking with cane and instructed patient to continue to use cane in left hand as she walks with increased limp when cane in right hand. Will continue with current POC as tolerated.    Personal Factors and Comorbidities Comorbidity 1    Comorbidities hx of rigth knee surgery    Examination-Activity Limitations Bathing;Sleep;Bed Mobility;Squat;Stairs;Stand;Transfers;Locomotion Level;Bend;Sit    Examination-Participation Restrictions Cleaning;Community Activity;Driving;Meal Prep;Occupation    Stability/Clinical Decision Making Stable/Uncomplicated    Rehab Potential Good    PT Frequency 3x / week    PT Duration 6 weeks    PT Treatment/Interventions ADLs/Self Care Home Management;Aquatic Therapy;Cryotherapy;Electrical Stimulation;Moist Heat;Traction;Balance training;Therapeutic exercise;Therapeutic activities;Functional mobility training;Stair training;Gait training;DME Instruction;Neuromuscular re-education;Patient/family education;Manual techniques;Dry needling;Passive range of motion;Joint Manipulations    PT Next Visit Plan pain management strategies, edema management, quad activation, knee ROM    PT Home Exercise Plan elevation, compression garments, heel slides 5/4 LAQ, SLR, heel slides; 5/11 knee flexion    Consulted and  Agree with Plan of Care Patient           Patient will benefit from skilled therapeutic intervention in order to improve the following deficits and impairments:  Abnormal gait,Decreased endurance,Decreased skin integrity,Pain,Increased edema,Decreased scar mobility,Decreased knowledge of precautions,Decreased balance,Decreased mobility,Difficulty walking,Decreased range of motion,Decreased strength,Decreased activity tolerance  Visit Diagnosis: Difficulty in walking, not elsewhere  classified  Muscle weakness (generalized)  Acute pain of right knee     Problem List Patient Active Problem List   Diagnosis Date Noted  . S/P total knee arthroplasty, right 01/23/2021  . IIH (idiopathic intracranial hypertension) 10/14/2020  . Discoloration of skin of finger 09/12/2020  . Bilateral hand pain 09/12/2020  . Paresthesia of skin 09/12/2020  . Chronic migraine without aura without status migrainosus, not intractable 06/22/2020  . Iron deficiency anemia 06/20/2019  . S/P left TKA 06/06/2019  . Status post total left knee replacement 06/06/2019  . Special screening for malignant neoplasms, colon 03/28/2019  . LLQ abdominal pain 09/26/2018  . Derangement of anterior horn of lateral meniscus of left knee   . Derangement of posterior horn of medial meniscus of left knee   . Chest pain 04/25/2016  . Hypokalemia 04/25/2016  . Hyperglycemia 04/25/2016  . Diverticulitis of colon 07/17/2014  . Nausea without vomiting 07/17/2014  . Crohn disease ([place]) 07/17/2014  . History of arthroscopy of right knee 07/25/2012  . Difficulty in walking(719.7) 06/29/2012  . Weakness of right leg 06/29/2012  . Posterior tibial tendonitis 11/11/2011  . PTTD (posterior tibial tendon dysfunction) 11/11/2011  . Knee pain, right 11/11/2011  . Chest pain 07/16/2011  . Primary osteoarthritis of left knee 02/25/2010  . PAIN IN JOINT, MULTIPLE SITES 02/25/2010  . Essential hypertension 08/13/2009  . HIATAL HERNIA 08/13/2009  . PALPITATIONS 08/13/2009  . DYSPNEA 08/13/2009   5:01 PM, 02/12/21 Jerene Pitch, DPT Physical Therapy with [facility]  [phone] office  [facility] [address] [place], [place], [address] Phone: [phone]   Fax:  [phone]  Name: [name] MRN: [id number] Date of Birth: 09/26/1966

## 2021-02-14 ENCOUNTER — Encounter (HOSPITAL_COMMUNITY): Payer: Self-pay

## 2021-02-14 ENCOUNTER — Other Ambulatory Visit: Payer: Self-pay

## 2021-02-14 ENCOUNTER — Ambulatory Visit (HOSPITAL_COMMUNITY): Payer: 59

## 2021-02-14 ENCOUNTER — Other Ambulatory Visit (HOSPITAL_COMMUNITY): Payer: Self-pay

## 2021-02-14 DIAGNOSIS — M6281 Muscle weakness (generalized): Secondary | ICD-10-CM | POA: Diagnosis not present

## 2021-02-14 DIAGNOSIS — M25662 Stiffness of left knee, not elsewhere classified: Secondary | ICD-10-CM | POA: Diagnosis not present

## 2021-02-14 DIAGNOSIS — M25561 Pain in right knee: Secondary | ICD-10-CM | POA: Diagnosis not present

## 2021-02-14 DIAGNOSIS — R262 Difficulty in walking, not elsewhere classified: Secondary | ICD-10-CM | POA: Diagnosis not present

## 2021-02-14 MED FILL — Amitriptyline HCl Tab 25 MG: ORAL | 30 days supply | Qty: 30 | Fill #1 | Status: AC

## 2021-02-14 NOTE — Therapy (Signed)
[facility] [address] [place], [place], [address] Phone: [phone]   Fax:  [phone]  Physical Therapy Treatment  Patient Details  Name: [name] MRN: [id number] Date of Birth: October 02, 1966 Referring Provider (PT): Paralee Cancel, MD   Encounter Date: 02/14/2021   PT End of Session - 02/14/21 1042    Visit Number 8    Number of Visits 18    Date for PT Re-Evaluation 03/10/21    Authorization Type [employer] - no auth or VL but review at the 25th visit    Authorization - Visit Number 8    Authorization - Number of Visits 25    Progress Note Due on Visit 10    PT Start Time 1030    PT Stop Time 1115    PT Time Calculation (min) 45 min    Equipment Utilized During Treatment Gait belt    Activity Tolerance Patient tolerated treatment well    Behavior During Therapy [facility] for tasks assessed/performed           Past Medical History:  Diagnosis Date  . Anxiety   . Arthritis   . Asthma   . Cervical hyperplasia    hisotry of  . Chest pain    cath 2005 norm cors, repeat cath Feb 2011 normal cors and only minimally  elevated pulmonary pressures  . Crohn disease ([place])   . Diverticulitis of colon 07/17/2014  . Diverticulosis   . Dyspnea   . GERD (gastroesophageal reflux disease)   . Hiatal hernia   . History of iron deficiency anemia   . Hypertension   . Idiopathic intracranial hypertension    Per patient  . Migraines   . Obesity   . Palpitations   . Pneumonia 2008   14 days hospitalization, bilateral  . PONV (postoperative nausea and vomiting)   . Sleep apnea sleep study 11/03/2009    cpap; does sometimes, doesnt use every night. weight loss no longer using CPAP  . Vitamin B deficiency     Past Surgical History:  Procedure Laterality Date  . ABDOMINAL HYSTERECTOMY    . ANKLE ARTHROSCOPY  06/01/2012   Procedure: ANKLE ARTHROSCOPY;  Surgeon: Colin Rhein, MD;  Location: [facility];  Service:  Orthopedics;  Laterality: Left;  left ankle arthorsocpy with extensive debridement and gastroc slide  . ANKLE SURGERY Left 05/11/2018  . BIOPSY  04/26/2019   Procedure: BIOPSY;  Surgeon: Rogene Houston, MD;  [facility];  Service: Endoscopy;;  ileum colon  . CARDIAC CATHETERIZATION  11/22/2009 and 2005   WNL  . CESAREAN SECTION    . CHOLECYSTECTOMY  09/08/2006   lap. chole.  . CHONDROPLASTY  06/17/2012   Procedure: CHONDROPLASTY;  Surgeon: Carole Civil, MD;  [facility];  Service: Orthopedics;  Laterality: Right;  right patella  . COLONOSCOPY N/A 04/26/2019   Procedure: COLONOSCOPY;  Surgeon: Rogene Houston, MD;  [facility];  Service: Endoscopy;  Laterality: N/A;  100  . ESOPHAGOGASTRODUODENOSCOPY N/A 05/14/2015   Procedure: ESOPHAGOGASTRODUODENOSCOPY (EGD);  Surgeon: Rogene Houston, MD;  [facility];  Service: Endoscopy;  Laterality: N/A;  730  . GIVENS CAPSULE STUDY N/A 05/24/2019   Procedure: GIVENS CAPSULE STUDY;  Surgeon: Rogene Houston, MD;  [facility];  Service: Endoscopy;  Laterality: N/A;  7:30  . INGUINAL HERNIA REPAIR  10/30/2008   right  . KNEE ARTHROSCOPY Right 2013  . KNEE  ARTHROSCOPY WITH LATERAL MENISECTOMY Left 12/23/2016   Procedure: LEFT KNEE ARTHROSCOPY WITH LATERAL MENISECTOMY;  Surgeon: Carole Civil, MD;  [facility];  Service: Orthopedics;  Laterality: Left;  . KNEE ARTHROSCOPY WITH MEDIAL MENISECTOMY Left 12/23/2016   Procedure: LEFT KNEE ARTHROSCOPY WITH MEDIAL MENISECTOMY CHONDROPLASTY PATELLA  AND MEDIAL FEMORAL CONDYLE LEFT KNEE;  Surgeon: Carole Civil, MD;  [facility];  Service: Orthopedics;  Laterality: Left;  . SHOULDER ARTHROSCOPY WITH SUBACROMIAL DECOMPRESSION AND BICEP TENDON REPAIR Left 12/21/2017   Procedure: Left shoulder arthroscopic biceps tenodesis, SAD, DCR and labrum debridement;  Surgeon: Nicholes Stairs, MD;  Location: [facility];  Service: Orthopedics;  Laterality:  Left;  120 mins  . SHOULDER SURGERY     right x 2   . TOTAL KNEE ARTHROPLASTY Left 06/06/2019   Procedure: TOTAL KNEE ARTHROPLASTY;  Surgeon: Paralee Cancel, MD;  [facility];  Service: Orthopedics;  Laterality: Left;  70 mins  . TOTAL KNEE ARTHROPLASTY Right 01/23/2021   Procedure: TOTAL KNEE ARTHROPLASTY;  Surgeon: Paralee Cancel, MD;  [facility];  Service: Orthopedics;  Laterality: Right;  70 mins    There were no vitals filed for this visit.   Subjective Assessment - 02/14/21 1042    Subjective Feeling better overall, forgot to take pain medicine this morning    Pertinent History previous knee surgery    Patient Stated Goals to be able to walk without waler    Currently in Pain? Yes    Pain Score 4     Pain Location Knee    Pain Orientation Right    Pain Descriptors / Indicators Aching;Tightness    Pain Type Surgical pain    Pain Onset 1 to 4 weeks ago              [facility] PT Assessment - 02/14/21 0001      Assessment   Medical Diagnosis R TKA    Referring Provider (PT) Paralee Cancel, MD                         [facility] Adult PT Treatment/Exercise - 02/14/21 0001      Knee/Hip Exercises: Stretches   Quad Stretch 4 reps;60 seconds   prone     Knee/Hip Exercises: Aerobic   Nustep level 4 x 8 min for dynamic warm-up      Knee/Hip Exercises: Machines for Strengthening   Cybex Knee Flexion 4.5 plates, 3x10      Knee/Hip Exercises: Standing   Lunge Walking - Round Trips lunge stretch on 12" step 15x    Gait Training resisted retro/forward walking with 3 plates x 2 min      Knee/Hip Exercises: Seated   Long Arc Quad Strengthening;Right;3 sets;10 reps    Long Arc Quad Weight 10 lbs.    Hamstring Curl Strengthening;Right;3 sets;10 reps    Hamstring Limitations green band                    PT Short Term Goals - 02/10/21 1400      PT SHORT TERM GOAL #1   Title Patient will be independent in self management strategies to improve quality  of life and functional outcomes.    Time 3    Period Weeks    Status On-going    Target Date 02/17/21      PT SHORT TERM GOAL #2   Title Patient will report at least 50% improvement in overall symptoms and/or function  to demonstrate improved functional mobility    Time 3    Period Weeks    Status On-going    Target Date 02/17/21      PT SHORT TERM GOAL #3   Title Patient will be able to ambualte at least 226 feet with least restrictive assistive device in 2 minutes to demonstrate improved walking endurance.    Time 3    Period Weeks    Status On-going    Target Date 02/17/21      PT SHORT TERM GOAL #4   Title Patient will achieve 0-100 degrees in right knee to demonstrate improved knee motion    Time 3    Period Weeks    Status On-going    Target Date 02/17/21             PT Long Term Goals - 01/27/21 1313      PT LONG TERM GOAL #1   Title Patient will report at least 75% improvement in overall symptoms and/or function to demonstrate improved functional mobility    Time 6    Period Weeks    Status New    Target Date 03/10/21      PT LONG TERM GOAL #2   Title Patient will improve on FOTO score to meet predicted outcomes to demonstrate improved functional mobility.    Time 6    Period Weeks    Status New    Target Date 03/10/21      PT LONG TERM GOAL #3   Title Patient will be able to ambualte at least 226 feet without assistive device in 2 minutes to demonstrate improved walking endurance.    Time 6    Period Weeks    Status New    Target Date 03/10/21      PT LONG TERM GOAL #4   Title Patient will achieve 0-110 degrees in right knee to demonstrate improved knee motion    Time 6    Period Weeks    Status New    Target Date 03/10/21                 Plan - 02/14/21 1126    Clinical Impression Statement Tolerating tx sessions well and reports overall decrease in pain and improved gait pattern and tolerance evidenced by increased distance and use of  cane for ambulation vs RW.  Demo compliance with HEP and progressing with ROM and strength with use of 10 lbs ankle weights for RLE PRE.  Continued POC indicated to improve right knee flexion ROM and normalize gait pattern to ambulation without AD    Personal Factors and Comorbidities Comorbidity 1    Comorbidities hx of rigth knee surgery    Examination-Activity Limitations Bathing;Sleep;Bed Mobility;Squat;Stairs;Stand;Transfers;Locomotion Level;Bend;Sit    Examination-Participation Restrictions Cleaning;Community Activity;Driving;Meal Prep;Occupation    Stability/Clinical Decision Making Stable/Uncomplicated    Rehab Potential Good    PT Frequency 3x / week    PT Duration 6 weeks    PT Treatment/Interventions ADLs/Self Care Home Management;Aquatic Therapy;Cryotherapy;Electrical Stimulation;Moist Heat;Traction;Balance training;Therapeutic exercise;Therapeutic activities;Functional mobility training;Stair training;Gait training;DME Instruction;Neuromuscular re-education;Patient/family education;Manual techniques;Dry needling;Passive range of motion;Joint Manipulations    PT Next Visit Plan squats, box lift, balance    PT Home Exercise Plan elevation, compression garments, heel slides 5/4 LAQ, SLR, heel slides; 5/11 knee flexion, prone quad stretch    Consulted and Agree with Plan of Care Patient           Patient will benefit from skilled therapeutic intervention in order to  improve the following deficits and impairments:  Abnormal gait,Decreased endurance,Decreased skin integrity,Pain,Increased edema,Decreased scar mobility,Decreased knowledge of precautions,Decreased balance,Decreased mobility,Difficulty walking,Decreased range of motion,Decreased strength,Decreased activity tolerance  Visit Diagnosis: Difficulty in walking, not elsewhere classified  Muscle weakness (generalized)  Acute pain of right knee     Problem List Patient Active Problem List   Diagnosis Date Noted  . S/P  total knee arthroplasty, right 01/23/2021  . IIH (idiopathic intracranial hypertension) 10/14/2020  . Discoloration of skin of finger 09/12/2020  . Bilateral hand pain 09/12/2020  . Paresthesia of skin 09/12/2020  . Chronic migraine without aura without status migrainosus, not intractable 06/22/2020  . Iron deficiency anemia 06/20/2019  . S/P left TKA 06/06/2019  . Status post total left knee replacement 06/06/2019  . Special screening for malignant neoplasms, colon 03/28/2019  . LLQ abdominal pain 09/26/2018  . Derangement of anterior horn of lateral meniscus of left knee   . Derangement of posterior horn of medial meniscus of left knee   . Chest pain 04/25/2016  . Hypokalemia 04/25/2016  . Hyperglycemia 04/25/2016  . Diverticulitis of colon 07/17/2014  . Nausea without vomiting 07/17/2014  . Crohn disease ([place]) 07/17/2014  . History of arthroscopy of right knee 07/25/2012  . Difficulty in walking(719.7) 06/29/2012  . Weakness of right leg 06/29/2012  . Posterior tibial tendonitis 11/11/2011  . PTTD (posterior tibial tendon dysfunction) 11/11/2011  . Knee pain, right 11/11/2011  . Chest pain 07/16/2011  . Primary osteoarthritis of left knee 02/25/2010  . PAIN IN JOINT, MULTIPLE SITES 02/25/2010  . Essential hypertension 08/13/2009  . HIATAL HERNIA 08/13/2009  . PALPITATIONS 08/13/2009  . DYSPNEA 08/13/2009   11:29 AM, 02/14/21 M. Sherlyn Lees, PT, DPT Physical Therapist- [facility] Office Number: [phone]  [facility] [address] [place], [place], [address] Phone: [phone]   Fax:  [phone]  Name: [name] MRN: [id number] Date of Birth: November 01, 1965

## 2021-02-17 ENCOUNTER — Other Ambulatory Visit: Payer: Self-pay

## 2021-02-17 ENCOUNTER — Other Ambulatory Visit (HOSPITAL_COMMUNITY): Payer: Self-pay

## 2021-02-17 ENCOUNTER — Ambulatory Visit (HOSPITAL_COMMUNITY): Payer: 59

## 2021-02-17 DIAGNOSIS — M6281 Muscle weakness (generalized): Secondary | ICD-10-CM | POA: Diagnosis not present

## 2021-02-17 DIAGNOSIS — M25561 Pain in right knee: Secondary | ICD-10-CM

## 2021-02-17 DIAGNOSIS — M25662 Stiffness of left knee, not elsewhere classified: Secondary | ICD-10-CM | POA: Diagnosis not present

## 2021-02-17 DIAGNOSIS — R262 Difficulty in walking, not elsewhere classified: Secondary | ICD-10-CM

## 2021-02-17 NOTE — Therapy (Signed)
[facility] [address] [place], [place], [address] Phone: [phone]   Fax:  [phone]  Physical Therapy Treatment  Patient Details  Name: KALLY CADDEN MRN: [id number] Date of Birth: 1966-05-29 Referring Provider (PT): Paralee Cancel, MD   Encounter Date: 02/17/2021   PT End of Session - 02/17/21 1315    Visit Number 9    Number of Visits 18    Date for PT Re-Evaluation 03/10/21    Authorization Type [employer] - no auth or VL but review at the 25th visit    Authorization - Visit Number 9    Authorization - Number of Visits 25    Progress Note Due on Visit 10    PT Start Time 1310    PT Stop Time 1345    PT Time Calculation (min) 35 min    Equipment Utilized During Treatment Gait belt    Activity Tolerance Patient tolerated treatment well    Behavior During Therapy [facility] for tasks assessed/performed           Past Medical History:  Diagnosis Date  . Anxiety   . Arthritis   . Asthma   . Cervical hyperplasia    hisotry of  . Chest pain    cath 2005 norm cors, repeat cath Feb 2011 normal cors and only minimally  elevated pulmonary pressures  . Crohn disease ([place])   . Diverticulitis of colon 07/17/2014  . Diverticulosis   . Dyspnea   . GERD (gastroesophageal reflux disease)   . Hiatal hernia   . History of iron deficiency anemia   . Hypertension   . Idiopathic intracranial hypertension    Per patient  . Migraines   . Obesity   . Palpitations   . Pneumonia 2008   14 days hospitalization, bilateral  . PONV (postoperative nausea and vomiting)   . Sleep apnea sleep study 11/03/2009    cpap; does sometimes, doesnt use every night. weight loss no longer using CPAP  . Vitamin B deficiency     Past Surgical History:  Procedure Laterality Date  . ABDOMINAL HYSTERECTOMY    . ANKLE ARTHROSCOPY  06/01/2012   Procedure: ANKLE ARTHROSCOPY;  Surgeon: Colin Rhein, MD;  Location: [facility];  Service:  Orthopedics;  Laterality: Left;  left ankle arthorsocpy with extensive debridement and gastroc slide  . ANKLE SURGERY Left 05/11/2018  . BIOPSY  04/26/2019   Procedure: BIOPSY;  Surgeon: Rogene Houston, MD;  [facility];  Service: Endoscopy;;  ileum colon  . CARDIAC CATHETERIZATION  11/22/2009 and 2005   WNL  . CESAREAN SECTION    . CHOLECYSTECTOMY  09/08/2006   lap. chole.  . CHONDROPLASTY  06/17/2012   Procedure: CHONDROPLASTY;  Surgeon: Carole Civil, MD;  [facility];  Service: Orthopedics;  Laterality: Right;  right patella  . COLONOSCOPY N/A 04/26/2019   Procedure: COLONOSCOPY;  Surgeon: Rogene Houston, MD;  [facility];  Service: Endoscopy;  Laterality: N/A;  100  . ESOPHAGOGASTRODUODENOSCOPY N/A 05/14/2015   Procedure: ESOPHAGOGASTRODUODENOSCOPY (EGD);  Surgeon: Rogene Houston, MD;  [facility];  Service: Endoscopy;  Laterality: N/A;  730  . GIVENS CAPSULE STUDY N/A 05/24/2019   Procedure: GIVENS CAPSULE STUDY;  Surgeon: Rogene Houston, MD;  [facility];  Service: Endoscopy;  Laterality: N/A;  7:30  . INGUINAL HERNIA REPAIR  10/30/2008   right  . KNEE ARTHROSCOPY Right 2013  . KNEE  ARTHROSCOPY WITH LATERAL MENISECTOMY Left 12/23/2016   Procedure: LEFT KNEE ARTHROSCOPY WITH LATERAL MENISECTOMY;  Surgeon: Carole Civil, MD;  [facility];  Service: Orthopedics;  Laterality: Left;  . KNEE ARTHROSCOPY WITH MEDIAL MENISECTOMY Left 12/23/2016   Procedure: LEFT KNEE ARTHROSCOPY WITH MEDIAL MENISECTOMY CHONDROPLASTY PATELLA  AND MEDIAL FEMORAL CONDYLE LEFT KNEE;  Surgeon: Carole Civil, MD;  [facility];  Service: Orthopedics;  Laterality: Left;  . SHOULDER ARTHROSCOPY WITH SUBACROMIAL DECOMPRESSION AND BICEP TENDON REPAIR Left 12/21/2017   Procedure: Left shoulder arthroscopic biceps tenodesis, SAD, DCR and labrum debridement;  Surgeon: Nicholes Stairs, MD;  Location: [facility];  Service: Orthopedics;  Laterality:  Left;  120 mins  . SHOULDER SURGERY     right x 2   . TOTAL KNEE ARTHROPLASTY Left 06/06/2019   Procedure: TOTAL KNEE ARTHROPLASTY;  Surgeon: Paralee Cancel, MD;  [facility];  Service: Orthopedics;  Laterality: Left;  70 mins  . TOTAL KNEE ARTHROPLASTY Right 01/23/2021   Procedure: TOTAL KNEE ARTHROPLASTY;  Surgeon: Paralee Cancel, MD;  [facility];  Service: Orthopedics;  Laterality: Right;  70 mins    There were no vitals filed for this visit.   Subjective Assessment - 02/17/21 1324    Subjective Feeling improved overall and able to attend wedding over the weekend and walk greater distances and more time active    Pertinent History previous knee surgery    Patient Stated Goals to be able to walk without waler    Currently in Pain? Yes    Pain Score 3     Pain Location Knee    Pain Orientation Right    Pain Descriptors / Indicators Aching;Tightness    Pain Type Surgical pain    Pain Onset 1 to 4 weeks ago              [facility] PT Assessment - 02/17/21 0001      Assessment   Medical Diagnosis R TKA    Referring Provider (PT) Paralee Cancel, MD                         [facility] Adult PT Treatment/Exercise - 02/17/21 0001      Knee/Hip Exercises: Aerobic   Stationary Bike rocking back and forth 5 minutes with cueing for holds at end range flexion seat 21      Knee/Hip Exercises: Machines for Strengthening   Cybex Knee Flexion 6 plates, 3x10      Knee/Hip Exercises: Standing   Gait Training resisted retro/forward walking with 4 plates x 2 min      Knee/Hip Exercises: Seated   Hamstring Curl Strengthening;Right;3 sets;10 reps    Hamstring Limitations green band    Sit to Sand 3 sets;10 reps;without UE support   elevated seat height                   PT Short Term Goals - 02/10/21 1400      PT SHORT TERM GOAL #1   Title Patient will be independent in self management strategies to improve quality of life and functional outcomes.    Time 3     Period Weeks    Status On-going    Target Date 02/17/21      PT SHORT TERM GOAL #2   Title Patient will report at least 50% improvement in overall symptoms and/or function to demonstrate improved functional mobility    Time 3    Period Weeks  Status On-going    Target Date 02/17/21      PT SHORT TERM GOAL #3   Title Patient will be able to ambualte at least 226 feet with least restrictive assistive device in 2 minutes to demonstrate improved walking endurance.    Time 3    Period Weeks    Status On-going    Target Date 02/17/21      PT SHORT TERM GOAL #4   Title Patient will achieve 0-100 degrees in right knee to demonstrate improved knee motion    Time 3    Period Weeks    Status On-going    Target Date 02/17/21             PT Long Term Goals - 01/27/21 1313      PT LONG TERM GOAL #1   Title Patient will report at least 75% improvement in overall symptoms and/or function to demonstrate improved functional mobility    Time 6    Period Weeks    Status New    Target Date 03/10/21      PT LONG TERM GOAL #2   Title Patient will improve on FOTO score to meet predicted outcomes to demonstrate improved functional mobility.    Time 6    Period Weeks    Status New    Target Date 03/10/21      PT LONG TERM GOAL #3   Title Patient will be able to ambualte at least 226 feet without assistive device in 2 minutes to demonstrate improved walking endurance.    Time 6    Period Weeks    Status New    Target Date 03/10/21      PT LONG TERM GOAL #4   Title Patient will achieve 0-110 degrees in right knee to demonstrate improved knee motion    Time 6    Period Weeks    Status New    Target Date 03/10/21                 Plan - 02/17/21 1344    Clinical Impression Statement Progressing well with POC details and demonstrating improved RLE strength and ROM as evidenced by symmetric sit to stand from elevated seat height without compensation.  Continued POC indicated to  improve RLE strength/ROM to [facility] to facilitate normalized gait pattern    Personal Factors and Comorbidities Comorbidity 1    Comorbidities hx of rigth knee surgery    Examination-Activity Limitations Bathing;Sleep;Bed Mobility;Squat;Stairs;Stand;Transfers;Locomotion Level;Bend;Sit    Examination-Participation Restrictions Cleaning;Community Activity;Driving;Meal Prep;Occupation    Stability/Clinical Decision Making Stable/Uncomplicated    Rehab Potential Good    PT Frequency 3x / week    PT Duration 6 weeks    PT Treatment/Interventions ADLs/Self Care Home Management;Aquatic Therapy;Cryotherapy;Electrical Stimulation;Moist Heat;Traction;Balance training;Therapeutic exercise;Therapeutic activities;Functional mobility training;Stair training;Gait training;DME Instruction;Neuromuscular re-education;Patient/family education;Manual techniques;Dry needling;Passive range of motion;Joint Manipulations    PT Next Visit Plan squats, box lift, balance    PT Home Exercise Plan elevation, compression garments, heel slides 5/4 LAQ, SLR, heel slides; 5/11 knee flexion, prone quad stretch    Consulted and Agree with Plan of Care Patient           Patient will benefit from skilled therapeutic intervention in order to improve the following deficits and impairments:  Abnormal gait,Decreased endurance,Decreased skin integrity,Pain,Increased edema,Decreased scar mobility,Decreased knowledge of precautions,Decreased balance,Decreased mobility,Difficulty walking,Decreased range of motion,Decreased strength,Decreased activity tolerance  Visit Diagnosis: Difficulty in walking, not elsewhere classified  Muscle weakness (generalized)  Acute pain of right  knee     Problem List Patient Active Problem List   Diagnosis Date Noted  . S/P total knee arthroplasty, right 01/23/2021  . IIH (idiopathic intracranial hypertension) 10/14/2020  . Discoloration of skin of finger 09/12/2020  . Bilateral hand pain  09/12/2020  . Paresthesia of skin 09/12/2020  . Chronic migraine without aura without status migrainosus, not intractable 06/22/2020  . Iron deficiency anemia 06/20/2019  . S/P left TKA 06/06/2019  . Status post total left knee replacement 06/06/2019  . Special screening for malignant neoplasms, colon 03/28/2019  . LLQ abdominal pain 09/26/2018  . Derangement of anterior horn of lateral meniscus of left knee   . Derangement of posterior horn of medial meniscus of left knee   . Chest pain 04/25/2016  . Hypokalemia 04/25/2016  . Hyperglycemia 04/25/2016  . Diverticulitis of colon 07/17/2014  . Nausea without vomiting 07/17/2014  . Crohn disease ([place]) 07/17/2014  . History of arthroscopy of right knee 07/25/2012  . Difficulty in walking(719.7) 06/29/2012  . Weakness of right leg 06/29/2012  . Posterior tibial tendonitis 11/11/2011  . PTTD (posterior tibial tendon dysfunction) 11/11/2011  . Knee pain, right 11/11/2011  . Chest pain 07/16/2011  . Primary osteoarthritis of left knee 02/25/2010  . PAIN IN JOINT, MULTIPLE SITES 02/25/2010  . Essential hypertension 08/13/2009  . HIATAL HERNIA 08/13/2009  . PALPITATIONS 08/13/2009  . DYSPNEA 08/13/2009   1:49 PM, 02/17/21 M. Sherlyn Lees, PT, DPT Physical Therapist- [facility] Office Number: [phone]  [facility] [address] [place], [place], [address] Phone: [phone]   Fax:  [phone]  Name: POLLIE POMA MRN: [id number] Date of Birth: November 15, 1965

## 2021-02-18 ENCOUNTER — Telehealth: Payer: Self-pay | Admitting: *Deleted

## 2021-02-18 NOTE — Telephone Encounter (Signed)
Completed [employer] PA on Cover My Meds. Key: [id number]. Awaiting determination from [place].

## 2021-02-19 ENCOUNTER — Ambulatory Visit (HOSPITAL_COMMUNITY): Payer: 59 | Admitting: Physical Therapy

## 2021-02-19 ENCOUNTER — Other Ambulatory Visit (HOSPITAL_COMMUNITY): Payer: Self-pay

## 2021-02-19 ENCOUNTER — Encounter (HOSPITAL_COMMUNITY): Payer: Self-pay | Admitting: Physical Therapy

## 2021-02-19 ENCOUNTER — Other Ambulatory Visit: Payer: Self-pay

## 2021-02-19 DIAGNOSIS — M25561 Pain in right knee: Secondary | ICD-10-CM | POA: Diagnosis not present

## 2021-02-19 DIAGNOSIS — M6281 Muscle weakness (generalized): Secondary | ICD-10-CM

## 2021-02-19 DIAGNOSIS — R262 Difficulty in walking, not elsewhere classified: Secondary | ICD-10-CM

## 2021-02-19 DIAGNOSIS — M25662 Stiffness of left knee, not elsewhere classified: Secondary | ICD-10-CM | POA: Diagnosis not present

## 2021-02-19 NOTE — Therapy (Signed)
[facility] [address] [place], [place], [address] Phone: [phone]   Fax:  [phone]  Physical Therapy Treatment and Progress Note  Patient Details  Name: DELORIS MOGER MRN: [id number] Date of Birth: 1966-07-10 Referring Provider (PT): Paralee Cancel, MD  Progress Note Reporting Period 01/27/21 to 02/19/21  See note below for Objective Data and Assessment of Progress/Goals.      Encounter Date: 02/19/2021   PT End of Session - 02/19/21 1315    Visit Number 10    Number of Visits 18    Date for PT Re-Evaluation 03/10/21    Authorization Type [employer] - no auth or VL but review at the 25th visit    Authorization - Visit Number 10    Authorization - Number of Visits 25    Progress Note Due on Visit 20    PT Start Time 1315    PT Stop Time 1355    PT Time Calculation (min) 40 min    Equipment Utilized During Treatment Gait belt    Activity Tolerance Patient tolerated treatment well    Behavior During Therapy WFL for tasks assessed/performed           Past Medical History:  Diagnosis Date  . Anxiety   . Arthritis   . Asthma   . Cervical hyperplasia    hisotry of  . Chest pain    cath 2005 norm cors, repeat cath Feb 2011 normal cors and only minimally  elevated pulmonary pressures  . Crohn disease ([place])   . Diverticulitis of colon 07/17/2014  . Diverticulosis   . Dyspnea   . GERD (gastroesophageal reflux disease)   . Hiatal hernia   . History of iron deficiency anemia   . Hypertension   . Idiopathic intracranial hypertension    Per patient  . Migraines   . Obesity   . Palpitations   . Pneumonia 2008   14 days hospitalization, bilateral  . PONV (postoperative nausea and vomiting)   . Sleep apnea sleep study 11/03/2009    cpap; does sometimes, doesnt use every night. weight loss no longer using CPAP  . Vitamin B deficiency     Past Surgical History:  Procedure Laterality Date  . ABDOMINAL  HYSTERECTOMY    . ANKLE ARTHROSCOPY  06/01/2012   Procedure: ANKLE ARTHROSCOPY;  Surgeon: Colin Rhein, MD;  Location: [facility];  Service: Orthopedics;  Laterality: Left;  left ankle arthorsocpy with extensive debridement and gastroc slide  . ANKLE SURGERY Left 05/11/2018  . BIOPSY  04/26/2019   Procedure: BIOPSY;  Surgeon: Rogene Houston, MD;  [facility];  Service: Endoscopy;;  ileum colon  . CARDIAC CATHETERIZATION  11/22/2009 and 2005   WNL  . CESAREAN SECTION    . CHOLECYSTECTOMY  09/08/2006   lap. chole.  . CHONDROPLASTY  06/17/2012   Procedure: CHONDROPLASTY;  Surgeon: Carole Civil, MD;  [facility];  Service: Orthopedics;  Laterality: Right;  right patella  . COLONOSCOPY N/A 04/26/2019   Procedure: COLONOSCOPY;  Surgeon: Rogene Houston, MD;  [facility];  Service: Endoscopy;  Laterality: N/A;  100  . ESOPHAGOGASTRODUODENOSCOPY N/A 05/14/2015   Procedure: ESOPHAGOGASTRODUODENOSCOPY (EGD);  Surgeon: Rogene Houston, MD;  [facility];  Service: Endoscopy;  Laterality: N/A;  730  . GIVENS CAPSULE STUDY N/A 05/24/2019   Procedure: GIVENS CAPSULE STUDY;  Surgeon: Rogene Houston, MD;  [facility];  Service:  Endoscopy;  Laterality: N/A;  7:30  . INGUINAL HERNIA REPAIR  10/30/2008   right  . KNEE ARTHROSCOPY Right 2013  . KNEE ARTHROSCOPY WITH LATERAL MENISECTOMY Left 12/23/2016   Procedure: LEFT KNEE ARTHROSCOPY WITH LATERAL MENISECTOMY;  Surgeon: Carole Civil, MD;  [facility];  Service: Orthopedics;  Laterality: Left;  . KNEE ARTHROSCOPY WITH MEDIAL MENISECTOMY Left 12/23/2016   Procedure: LEFT KNEE ARTHROSCOPY WITH MEDIAL MENISECTOMY CHONDROPLASTY PATELLA  AND MEDIAL FEMORAL CONDYLE LEFT KNEE;  Surgeon: Carole Civil, MD;  [facility];  Service: Orthopedics;  Laterality: Left;  . SHOULDER ARTHROSCOPY WITH SUBACROMIAL DECOMPRESSION AND BICEP TENDON REPAIR Left 12/21/2017   Procedure: Left  shoulder arthroscopic biceps tenodesis, SAD, DCR and labrum debridement;  Surgeon: Nicholes Stairs, MD;  Location: [facility];  Service: Orthopedics;  Laterality: Left;  120 mins  . SHOULDER SURGERY     right x 2   . TOTAL KNEE ARTHROPLASTY Left 06/06/2019   Procedure: TOTAL KNEE ARTHROPLASTY;  Surgeon: Paralee Cancel, MD;  [facility];  Service: Orthopedics;  Laterality: Left;  70 mins  . TOTAL KNEE ARTHROPLASTY Right 01/23/2021   Procedure: TOTAL KNEE ARTHROPLASTY;  Surgeon: Paralee Cancel, MD;  [facility];  Service: Orthopedics;  Laterality: Right;  70 mins    There were no vitals filed for this visit.   Subjective Assessment - 02/19/21 1320    Subjective States she has 3/10 pain. States that overall she feels about 50% better since the start of PT. States she does her exercises daily.    Pertinent History previous knee surgery    Patient Stated Goals to be able to walk without waler    Currently in Pain? Yes    Pain Score 3     Pain Location Knee    Pain Orientation Right    Pain Type Surgical pain    Pain Onset 1 to 4 weeks ago              [facility] PT Assessment - 02/19/21 0001      Assessment   Medical Diagnosis R TKA    Referring Provider (PT) Paralee Cancel, MD      Observation/Other Assessments   Focus on Therapeutic Outcomes (FOTO)  49% function   was 3% function     AROM   Right Knee Extension 0    Right Knee Flexion 106      Ambulation/Gait   Ambulation/Gait Yes    Ambulation/Gait Assistance 6: Modified independent (Device/Increase time);5: Supervision    Ambulation Distance (Feet) 326 Feet    Assistive device Straight cane    Gait Pattern Decreased stance time - right;Decreased stride length;Decreased hip/knee flexion - right;Decreased dorsiflexion - right;Decreased weight shift to right    Gait velocity decreased                         [facility] Adult PT Treatment/Exercise - 02/19/21 0001      Ambulation/Gait   Gait Comments 2MW; then  practiced walking without cane., then 2MW without cane was 340 feet      Knee/Hip Exercises: Aerobic   Stationary Bike pedaled backwards then forwardsat seat 17 after rocking backwards - 8 minutes while performing FOTO      Knee/Hip Exercises: Seated   Sit to Sand 5 reps;without UE support   elevated height - progressively reduced - last height set at 22 inches and performed 3 sets at this height  PT Education - 02/19/21 1357    Education Details on current presentation, on HEP and POC and FOTO score    Person(s) Educated Patient    Methods Explanation    Comprehension Verbalized understanding            PT Short Term Goals - 02/19/21 1321      PT SHORT TERM GOAL #1   Title Patient will be independent in self management strategies to improve quality of life and functional outcomes.    Time 3    Period Weeks    Status Achieved    Target Date 02/17/21      PT SHORT TERM GOAL #2   Title Patient will report at least 50% improvement in overall symptoms and/or function to demonstrate improved functional mobility    Time 3    Period Weeks    Status Achieved    Target Date 02/17/21      PT SHORT TERM GOAL #3   Title Patient will be able to ambualte at least 226 feet with least restrictive assistive device in 2 minutes to demonstrate improved walking endurance.    Time 3    Period Weeks    Status Achieved    Target Date 02/17/21      PT SHORT TERM GOAL #4   Title Patient will achieve 0-100 degrees in right knee to demonstrate improved knee motion    Time 3    Period Weeks    Status Achieved    Target Date 02/17/21             PT Long Term Goals - 02/19/21 1321      PT LONG TERM GOAL #1   Title Patient will report at least 75% improvement in overall symptoms and/or function to demonstrate improved functional mobility    Time 6    Period Weeks    Status On-going      PT LONG TERM GOAL #2   Title Patient will improve on FOTO score to meet  predicted outcomes to demonstrate improved functional mobility.    Time 6    Period Weeks    Status On-going      PT LONG TERM GOAL #3   Title Patient will be able to ambualte at least 226 feet without assistive device in 2 minutes to demonstrate improved walking endurance.    Time 6    Period Weeks    Status Achieved      PT LONG TERM GOAL #4   Title Patient will achieve 0-110 degrees in right knee to demonstrate improved knee motion    Time 6    Period Weeks    Status On-going                 Plan - 02/19/21 1357    Clinical Impression Statement Progress note on this date. Patient has met all short term goals and has met one long term goals at this time. Answered all questions and reduced patient to 2x/week. Will continue with current POC. Discussed focusing on equal weight bearing with sit to stands as she has a tendency to kick out her right leg. Will continue with current POC as tolerated.    Personal Factors and Comorbidities Comorbidity 1    Comorbidities hx of rigth knee surgery    Examination-Activity Limitations Bathing;Sleep;Bed Mobility;Squat;Stairs;Stand;Transfers;Locomotion Level;Bend;Sit    Examination-Participation Restrictions Cleaning;Community Activity;Driving;Meal Prep;Occupation    Stability/Clinical Decision Making Stable/Uncomplicated    Rehab Potential Good    PT Frequency 3x /  week    PT Duration 6 weeks    PT Treatment/Interventions ADLs/Self Care Home Management;Aquatic Therapy;Cryotherapy;Electrical Stimulation;Moist Heat;Traction;Balance training;Therapeutic exercise;Therapeutic activities;Functional mobility training;Stair training;Gait training;DME Instruction;Neuromuscular re-education;Patient/family education;Manual techniques;Dry needling;Passive range of motion;Joint Manipulations    PT Next Visit Plan squats, box lift, balance    PT Home Exercise Plan elevation, compression garments, heel slides 5/4 LAQ, SLR, heel slides; 5/11 knee flexion,  prone quad stretch; 5/18 STS with equal weight on right LE    Consulted and Agree with Plan of Care Patient           Patient will benefit from skilled therapeutic intervention in order to improve the following deficits and impairments:  Abnormal gait,Decreased endurance,Decreased skin integrity,Pain,Increased edema,Decreased scar mobility,Decreased knowledge of precautions,Decreased balance,Decreased mobility,Difficulty walking,Decreased range of motion,Decreased strength,Decreased activity tolerance  Visit Diagnosis: Difficulty in walking, not elsewhere classified  Muscle weakness (generalized)  Acute pain of right knee     Problem List Patient Active Problem List   Diagnosis Date Noted  . S/P total knee arthroplasty, right 01/23/2021  . IIH (idiopathic intracranial hypertension) 10/14/2020  . Discoloration of skin of finger 09/12/2020  . Bilateral hand pain 09/12/2020  . Paresthesia of skin 09/12/2020  . Chronic migraine without aura without status migrainosus, not intractable 06/22/2020  . Iron deficiency anemia 06/20/2019  . S/P left TKA 06/06/2019  . Status post total left knee replacement 06/06/2019  . Special screening for malignant neoplasms, colon 03/28/2019  . LLQ abdominal pain 09/26/2018  . Derangement of anterior horn of lateral meniscus of left knee   . Derangement of posterior horn of medial meniscus of left knee   . Chest pain 04/25/2016  . Hypokalemia 04/25/2016  . Hyperglycemia 04/25/2016  . Diverticulitis of colon 07/17/2014  . Nausea without vomiting 07/17/2014  . Crohn disease ([place]) 07/17/2014  . History of arthroscopy of right knee 07/25/2012  . Difficulty in walking(719.7) 06/29/2012  . Weakness of right leg 06/29/2012  . Posterior tibial tendonitis 11/11/2011  . PTTD (posterior tibial tendon dysfunction) 11/11/2011  . Knee pain, right 11/11/2011  . Chest pain 07/16/2011  . Primary osteoarthritis of left knee 02/25/2010  . PAIN IN JOINT,  MULTIPLE SITES 02/25/2010  . Essential hypertension 08/13/2009  . HIATAL HERNIA 08/13/2009  . PALPITATIONS 08/13/2009  . DYSPNEA 08/13/2009   1:59 PM, 02/19/21 Jerene Pitch, DPT Physical Therapy with [facility]  [phone] office  [facility] [address] [place], [place], [address] Phone: [phone]   Fax:  [phone]  Name: KAETLIN BULLEN MRN: [id number] Date of Birth: 04/28/1966

## 2021-02-20 ENCOUNTER — Other Ambulatory Visit (HOSPITAL_COMMUNITY): Payer: Self-pay

## 2021-02-20 NOTE — Telephone Encounter (Signed)
The request has been approved. The authorization is effective for a maximum of 1 fills from 02/17/2021 to 03/19/2021, as long as the member is enrolled in their current health plan. The request was approved as submitted. This request has been approved for one month allowing 29m per 30 days. An additional authorization has been approved for [employer] 1280mmL pen allowing 54m30mer 30 days, effective 03/15/2021 through 08/14/2021; please reference authorization [id number] written notification letter will follow with additional details.

## 2021-02-21 ENCOUNTER — Other Ambulatory Visit: Payer: Self-pay

## 2021-02-21 ENCOUNTER — Encounter (HOSPITAL_COMMUNITY): Payer: Self-pay

## 2021-02-21 ENCOUNTER — Ambulatory Visit (HOSPITAL_COMMUNITY): Payer: 59

## 2021-02-21 DIAGNOSIS — M6281 Muscle weakness (generalized): Secondary | ICD-10-CM

## 2021-02-21 DIAGNOSIS — M25561 Pain in right knee: Secondary | ICD-10-CM

## 2021-02-21 DIAGNOSIS — R262 Difficulty in walking, not elsewhere classified: Secondary | ICD-10-CM

## 2021-02-21 DIAGNOSIS — M25662 Stiffness of left knee, not elsewhere classified: Secondary | ICD-10-CM | POA: Diagnosis not present

## 2021-02-21 NOTE — Therapy (Signed)
[facility] [address] [place], [place], [address] Phone: [phone]   Fax:  [phone]  Physical Therapy Treatment  Patient Details  Name: [name] MRN: [id number] Date of Birth: 20-Dec-1965 Referring Provider (PT): Paralee Cancel, MD   Encounter Date: 02/21/2021   PT End of Session - 02/21/21 1048    Visit Number 11    Number of Visits 18    Date for PT Re-Evaluation 03/10/21    Authorization Type [employer] - no auth or VL but review at the 25th visit    Authorization - Visit Number 11    Authorization - Number of Visits 25    Progress Note Due on Visit 20    PT Start Time 1036    PT Stop Time 1115    PT Time Calculation (min) 39 min    Equipment Utilized During Treatment Gait belt    Activity Tolerance Patient tolerated treatment well    Behavior During Therapy [facility] for tasks assessed/performed           Past Medical History:  Diagnosis Date  . Anxiety   . Arthritis   . Asthma   . Cervical hyperplasia    hisotry of  . Chest pain    cath 2005 norm cors, repeat cath Feb 2011 normal cors and only minimally  elevated pulmonary pressures  . Crohn disease ([place])   . Diverticulitis of colon 07/17/2014  . Diverticulosis   . Dyspnea   . GERD (gastroesophageal reflux disease)   . Hiatal hernia   . History of iron deficiency anemia   . Hypertension   . Idiopathic intracranial hypertension    Per patient  . Migraines   . Obesity   . Palpitations   . Pneumonia 2008   14 days hospitalization, bilateral  . PONV (postoperative nausea and vomiting)   . Sleep apnea sleep study 11/03/2009    cpap; does sometimes, doesnt use every night. weight loss no longer using CPAP  . Vitamin B deficiency     Past Surgical History:  Procedure Laterality Date  . ABDOMINAL HYSTERECTOMY    . ANKLE ARTHROSCOPY  06/01/2012   Procedure: ANKLE ARTHROSCOPY;  Surgeon: Colin Rhein, MD;  Location: [facility];  Service:  Orthopedics;  Laterality: Left;  left ankle arthorsocpy with extensive debridement and gastroc slide  . ANKLE SURGERY Left 05/11/2018  . BIOPSY  04/26/2019   Procedure: BIOPSY;  Surgeon: Rogene Houston, MD;  [facility];  Service: Endoscopy;;  ileum colon  . CARDIAC CATHETERIZATION  11/22/2009 and 2005   WNL  . CESAREAN SECTION    . CHOLECYSTECTOMY  09/08/2006   lap. chole.  . CHONDROPLASTY  06/17/2012   Procedure: CHONDROPLASTY;  Surgeon: Carole Civil, MD;  [facility];  Service: Orthopedics;  Laterality: Right;  right patella  . COLONOSCOPY N/A 04/26/2019   Procedure: COLONOSCOPY;  Surgeon: Rogene Houston, MD;  [facility];  Service: Endoscopy;  Laterality: N/A;  100  . ESOPHAGOGASTRODUODENOSCOPY N/A 05/14/2015   Procedure: ESOPHAGOGASTRODUODENOSCOPY (EGD);  Surgeon: Rogene Houston, MD;  [facility];  Service: Endoscopy;  Laterality: N/A;  730  . GIVENS CAPSULE STUDY N/A 05/24/2019   Procedure: GIVENS CAPSULE STUDY;  Surgeon: Rogene Houston, MD;  [facility];  Service: Endoscopy;  Laterality: N/A;  7:30  . INGUINAL HERNIA REPAIR  10/30/2008   right  . KNEE ARTHROSCOPY Right 2013  . KNEE  ARTHROSCOPY WITH LATERAL MENISECTOMY Left 12/23/2016   Procedure: LEFT KNEE ARTHROSCOPY WITH LATERAL MENISECTOMY;  Surgeon: Carole Civil, MD;  [facility];  Service: Orthopedics;  Laterality: Left;  . KNEE ARTHROSCOPY WITH MEDIAL MENISECTOMY Left 12/23/2016   Procedure: LEFT KNEE ARTHROSCOPY WITH MEDIAL MENISECTOMY CHONDROPLASTY PATELLA  AND MEDIAL FEMORAL CONDYLE LEFT KNEE;  Surgeon: Carole Civil, MD;  [facility];  Service: Orthopedics;  Laterality: Left;  . SHOULDER ARTHROSCOPY WITH SUBACROMIAL DECOMPRESSION AND BICEP TENDON REPAIR Left 12/21/2017   Procedure: Left shoulder arthroscopic biceps tenodesis, SAD, DCR and labrum debridement;  Surgeon: Nicholes Stairs, MD;  Location: [facility];  Service: Orthopedics;  Laterality:  Left;  120 mins  . SHOULDER SURGERY     right x 2   . TOTAL KNEE ARTHROPLASTY Left 06/06/2019   Procedure: TOTAL KNEE ARTHROPLASTY;  Surgeon: Paralee Cancel, MD;  [facility];  Service: Orthopedics;  Laterality: Left;  70 mins  . TOTAL KNEE ARTHROPLASTY Right 01/23/2021   Procedure: TOTAL KNEE ARTHROPLASTY;  Surgeon: Paralee Cancel, MD;  [facility];  Service: Orthopedics;  Laterality: Right;  70 mins    There were no vitals filed for this visit.   Subjective Assessment - 02/21/21 1047    Subjective States she has 3/10 pain. States that overall she feels about 50% better since the start of PT. States she does her exercises daily. Pt notes increased discomfort in right hip today and is unsure as to the source    Pertinent History previous knee surgery    Patient Stated Goals to be able to walk without waler    Currently in Pain? Yes    Pain Score 3     Pain Location Knee    Pain Orientation Right    Pain Descriptors / Indicators Aching;Tightness    Pain Type Surgical pain    Pain Onset 1 to 4 weeks ago              [facility] PT Assessment - 02/21/21 0001      Assessment   Medical Diagnosis R TKA    Referring Provider (PT) Paralee Cancel, MD    Onset Date/Surgical Date 01/23/21                         [facility] Adult PT Treatment/Exercise - 02/21/21 0001      Knee/Hip Exercises: Stretches   Gastroc Stretch Both;2 reps;60 seconds    Gastroc Stretch Limitations slantboard      Knee/Hip Exercises: Aerobic   Nustep level 4 x 8 min for dynamic warm-up      Knee/Hip Exercises: Machines for Strengthening   Cybex Knee Flexion 6 plates, 3x10      Knee/Hip Exercises: Standing   Lunge Walking - Round Trips lunge stretch on 12" step 15x    Gait Training resisted retro/forward walking with 3 plates x 2 min    Other Standing Knee Exercises static standing airex pad x 2 min withstanding moderate postural perturbations. staggered stance on airex pads 2x60 sec                     PT Short Term Goals - 02/19/21 1321      PT SHORT TERM GOAL #1   Title Patient will be independent in self management strategies to improve quality of life and functional outcomes.    Time 3    Period Weeks    Status Achieved    Target Date  02/17/21      PT SHORT TERM GOAL #2   Title Patient will report at least 50% improvement in overall symptoms and/or function to demonstrate improved functional mobility    Time 3    Period Weeks    Status Achieved    Target Date 02/17/21      PT SHORT TERM GOAL #3   Title Patient will be able to ambualte at least 226 feet with least restrictive assistive device in 2 minutes to demonstrate improved walking endurance.    Time 3    Period Weeks    Status Achieved    Target Date 02/17/21      PT SHORT TERM GOAL #4   Title Patient will achieve 0-100 degrees in right knee to demonstrate improved knee motion    Time 3    Period Weeks    Status Achieved    Target Date 02/17/21             PT Long Term Goals - 02/19/21 1321      PT LONG TERM GOAL #1   Title Patient will report at least 75% improvement in overall symptoms and/or function to demonstrate improved functional mobility    Time 6    Period Weeks    Status On-going      PT LONG TERM GOAL #2   Title Patient will improve on FOTO score to meet predicted outcomes to demonstrate improved functional mobility.    Time 6    Period Weeks    Status On-going      PT LONG TERM GOAL #3   Title Patient will be able to ambualte at least 226 feet without assistive device in 2 minutes to demonstrate improved walking endurance.    Time 6    Period Weeks    Status Achieved      PT LONG TERM GOAL #4   Title Patient will achieve 0-110 degrees in right knee to demonstrate improved knee motion    Time 6    Period Weeks    Status On-going                 Plan - 02/21/21 1116    Clinical Impression Statement Limited activity tolerance today due to new onset of  right hip/groin discomfort requiring less intensive strengthening exercises and tx focus more on mobility and stretching to allieviate RLE symptoms and tolerated well. Continued POC indicated to progress strength and ROM and facilitate normalized ambulation    Personal Factors and Comorbidities Comorbidity 1    Comorbidities hx of rigth knee surgery    Examination-Activity Limitations Bathing;Sleep;Bed Mobility;Squat;Stairs;Stand;Transfers;Locomotion Level;Bend;Sit    Examination-Participation Restrictions Cleaning;Community Activity;Driving;Meal Prep;Occupation    Stability/Clinical Decision Making Stable/Uncomplicated    Rehab Potential Good    PT Frequency 3x / week    PT Duration 6 weeks    PT Treatment/Interventions ADLs/Self Care Home Management;Aquatic Therapy;Cryotherapy;Electrical Stimulation;Moist Heat;Traction;Balance training;Therapeutic exercise;Therapeutic activities;Functional mobility training;Stair training;Gait training;DME Instruction;Neuromuscular re-education;Patient/family education;Manual techniques;Dry needling;Passive range of motion;Joint Manipulations    PT Next Visit Plan squats, box lift, balance    PT Home Exercise Plan elevation, compression garments, heel slides 5/4 LAQ, SLR, heel slides; 5/11 knee flexion, prone quad stretch; 5/18 STS with equal weight on right LE    Consulted and Agree with Plan of Care Patient           Patient will benefit from skilled therapeutic intervention in order to improve the following deficits and impairments:  Abnormal gait,Decreased endurance,Decreased skin integrity,Pain,Increased edema,Decreased  scar mobility,Decreased knowledge of precautions,Decreased balance,Decreased mobility,Difficulty walking,Decreased range of motion,Decreased strength,Decreased activity tolerance  Visit Diagnosis: Difficulty in walking, not elsewhere classified  Muscle weakness (generalized)  Acute pain of right knee     Problem List Patient  Active Problem List   Diagnosis Date Noted  . S/P total knee arthroplasty, right 01/23/2021  . IIH (idiopathic intracranial hypertension) 10/14/2020  . Discoloration of skin of finger 09/12/2020  . Bilateral hand pain 09/12/2020  . Paresthesia of skin 09/12/2020  . Chronic migraine without aura without status migrainosus, not intractable 06/22/2020  . Iron deficiency anemia 06/20/2019  . S/P left TKA 06/06/2019  . Status post total left knee replacement 06/06/2019  . Special screening for malignant neoplasms, colon 03/28/2019  . LLQ abdominal pain 09/26/2018  . Derangement of anterior horn of lateral meniscus of left knee   . Derangement of posterior horn of medial meniscus of left knee   . Chest pain 04/25/2016  . Hypokalemia 04/25/2016  . Hyperglycemia 04/25/2016  . Diverticulitis of colon 07/17/2014  . Nausea without vomiting 07/17/2014  . Crohn disease ([place]) 07/17/2014  . History of arthroscopy of right knee 07/25/2012  . Difficulty in walking(719.7) 06/29/2012  . Weakness of right leg 06/29/2012  . Posterior tibial tendonitis 11/11/2011  . PTTD (posterior tibial tendon dysfunction) 11/11/2011  . Knee pain, right 11/11/2011  . Chest pain 07/16/2011  . Primary osteoarthritis of left knee 02/25/2010  . PAIN IN JOINT, MULTIPLE SITES 02/25/2010  . Essential hypertension 08/13/2009  . HIATAL HERNIA 08/13/2009  . PALPITATIONS 08/13/2009  . DYSPNEA 08/13/2009   11:19 AM, 02/21/21 M. Sherlyn Lees, PT, DPT Physical Therapist- [place] Office Number: [phone]  [facility] [address] [place], [place], [address] Phone: [phone]   Fax:  [phone]  Name: [name] MRN: [id number] Date of Birth: 1966/07/21

## 2021-02-22 ENCOUNTER — Other Ambulatory Visit (HOSPITAL_COMMUNITY): Payer: Self-pay

## 2021-02-24 ENCOUNTER — Ambulatory Visit (HOSPITAL_COMMUNITY): Payer: 59 | Admitting: Physical Therapy

## 2021-02-24 ENCOUNTER — Other Ambulatory Visit: Payer: Self-pay

## 2021-02-24 ENCOUNTER — Encounter (HOSPITAL_COMMUNITY): Payer: Self-pay | Admitting: Physical Therapy

## 2021-02-24 DIAGNOSIS — M6281 Muscle weakness (generalized): Secondary | ICD-10-CM | POA: Diagnosis not present

## 2021-02-24 DIAGNOSIS — M25662 Stiffness of left knee, not elsewhere classified: Secondary | ICD-10-CM

## 2021-02-24 DIAGNOSIS — M25561 Pain in right knee: Secondary | ICD-10-CM | POA: Diagnosis not present

## 2021-02-24 DIAGNOSIS — R262 Difficulty in walking, not elsewhere classified: Secondary | ICD-10-CM | POA: Diagnosis not present

## 2021-02-24 NOTE — Therapy (Signed)
[facility] [address] [place], [place], [address] Phone: [phone]   Fax:  [phone]  Physical Therapy Treatment  Patient Details  Name: [name] MRN: [id number] Date of Birth: 1966/01/15 Referring Provider (PT): Paralee Cancel, MD   Encounter Date: 02/24/2021   PT End of Session - 02/24/21 1655    Visit Number 12    Number of Visits 18    Date for PT Re-Evaluation 03/10/21    Authorization Type [employer] - no auth or VL but review at the 25th visit    Authorization - Visit Number 12    Authorization - Number of Visits 25    Progress Note Due on Visit 20    PT Start Time 1632    PT Stop Time 1710    PT Time Calculation (min) 38 min    Equipment Utilized During Treatment Gait belt    Activity Tolerance Patient tolerated treatment well    Behavior During Therapy [facility] for tasks assessed/performed           Past Medical History:  Diagnosis Date  . Anxiety   . Arthritis   . Asthma   . Cervical hyperplasia    hisotry of  . Chest pain    cath 2005 norm cors, repeat cath Feb 2011 normal cors and only minimally  elevated pulmonary pressures  . Crohn disease ([place])   . Diverticulitis of colon 07/17/2014  . Diverticulosis   . Dyspnea   . GERD (gastroesophageal reflux disease)   . Hiatal hernia   . History of iron deficiency anemia   . Hypertension   . Idiopathic intracranial hypertension    Per patient  . Migraines   . Obesity   . Palpitations   . Pneumonia 2008   14 days hospitalization, bilateral  . PONV (postoperative nausea and vomiting)   . Sleep apnea sleep study 11/03/2009    cpap; does sometimes, doesnt use every night. weight loss no longer using CPAP  . Vitamin B deficiency     Past Surgical History:  Procedure Laterality Date  . ABDOMINAL HYSTERECTOMY    . ANKLE ARTHROSCOPY  06/01/2012   Procedure: ANKLE ARTHROSCOPY;  Surgeon: Colin Rhein, MD;  Location: [place];  Service:  Orthopedics;  Laterality: Left;  left ankle arthorsocpy with extensive debridement and gastroc slide  . ANKLE SURGERY Left 05/11/2018  . BIOPSY  04/26/2019   Procedure: BIOPSY;  Surgeon: Rogene Houston, MD;  [facility];  Service: Endoscopy;;  ileum colon  . CARDIAC CATHETERIZATION  11/22/2009 and 2005   WNL  . CESAREAN SECTION    . CHOLECYSTECTOMY  09/08/2006   lap. chole.  . CHONDROPLASTY  06/17/2012   Procedure: CHONDROPLASTY;  Surgeon: Carole Civil, MD;  [facility];  Service: Orthopedics;  Laterality: Right;  right patella  . COLONOSCOPY N/A 04/26/2019   Procedure: COLONOSCOPY;  Surgeon: Rogene Houston, MD;  [facility];  Service: Endoscopy;  Laterality: N/A;  100  . ESOPHAGOGASTRODUODENOSCOPY N/A 05/14/2015   Procedure: ESOPHAGOGASTRODUODENOSCOPY (EGD);  Surgeon: Rogene Houston, MD;  [facility];  Service: Endoscopy;  Laterality: N/A;  730  . GIVENS CAPSULE STUDY N/A 05/24/2019   Procedure: GIVENS CAPSULE STUDY;  Surgeon: Rogene Houston, MD;  [facility];  Service: Endoscopy;  Laterality: N/A;  7:30  . INGUINAL HERNIA REPAIR  10/30/2008   right  . KNEE ARTHROSCOPY Right 2013  . KNEE  ARTHROSCOPY WITH LATERAL MENISECTOMY Left 12/23/2016   Procedure: LEFT KNEE ARTHROSCOPY WITH LATERAL MENISECTOMY;  Surgeon: Carole Civil, MD;  [facility];  Service: Orthopedics;  Laterality: Left;  . KNEE ARTHROSCOPY WITH MEDIAL MENISECTOMY Left 12/23/2016   Procedure: LEFT KNEE ARTHROSCOPY WITH MEDIAL MENISECTOMY CHONDROPLASTY PATELLA  AND MEDIAL FEMORAL CONDYLE LEFT KNEE;  Surgeon: Carole Civil, MD;  [facility];  Service: Orthopedics;  Laterality: Left;  . SHOULDER ARTHROSCOPY WITH SUBACROMIAL DECOMPRESSION AND BICEP TENDON REPAIR Left 12/21/2017   Procedure: Left shoulder arthroscopic biceps tenodesis, SAD, DCR and labrum debridement;  Surgeon: Nicholes Stairs, MD;  Location: [facility] [facility];  Service: Orthopedics;  Laterality:  Left;  120 mins  . SHOULDER SURGERY     right x 2   . TOTAL KNEE ARTHROPLASTY Left 06/06/2019   Procedure: TOTAL KNEE ARTHROPLASTY;  Surgeon: Paralee Cancel, MD;  [facility];  Service: Orthopedics;  Laterality: Left;  70 mins  . TOTAL KNEE ARTHROPLASTY Right 01/23/2021   Procedure: TOTAL KNEE ARTHROPLASTY;  Surgeon: Paralee Cancel, MD;  [facility];  Service: Orthopedics;  Laterality: Right;  70 mins    There were no vitals filed for this visit.   Subjective Assessment - 02/24/21 1635    Subjective States her pain on the inside of her leg has been hurting more than usual.  Her son will need surgery on his knee so she has been trying to assist him as well as trying to take care of herself.    Pertinent History previous knee surgery    Patient Stated Goals to be able to walk without waler    Currently in Pain? Yes    Pain Score 4     Pain Location Knee    Pain Orientation Right;Medial    Pain Descriptors / Indicators Tender;Aching    Pain Type Surgical pain    Pain Onset 1 to 4 weeks ago                             [facility] Adult PT Treatment/Exercise - 02/24/21 0001      Exercises   Exercises Knee/Hip      Knee/Hip Exercises: Stretches   Quad Stretch Right;3 reps;30 seconds    Gastroc Stretch --    Press photographer Limitations --      Knee/Hip Exercises: Aerobic   Recumbent Bike bike for ROM seat at 15 backward revolution    Nustep --   at end of treatment     Knee/Hip Exercises: Standing   Functional Squat 10 reps    SLS SLS; x5 max 10"   SLS with Vectors .      Knee/Hip Exercises: Supine   Quad Sets AROM;Right;10 reps    Terminal Knee Extension Strengthening;Right;10 reps    Knee Extension AROM    Knee Extension Limitations 0    Knee Flexion AROM      Knee/Hip Exercises: Prone   Hamstring Curl 10 reps    Contract/Relax to Increase Flexion 5x    Other Prone Exercises terminal extension and flexion both 10 x      Manual Therapy   Manual  Therapy Edema management;Joint mobilization;Passive ROM    Manual therapy comments Manual complete separate than rest of tx    Edema Management Manual decongestive techniques with LE elevated    Joint Mobilization patella mobs to increase ROM    Passive ROM to increase ROM  PT Short Term Goals - 02/19/21 1321      PT SHORT TERM GOAL #1   Title Patient will be independent in self management strategies to improve quality of life and functional outcomes.    Time 3    Period Weeks    Status Achieved    Target Date 02/17/21      PT SHORT TERM GOAL #2   Title Patient will report at least 50% improvement in overall symptoms and/or function to demonstrate improved functional mobility    Time 3    Period Weeks    Status Achieved    Target Date 02/17/21      PT SHORT TERM GOAL #3   Title Patient will be able to ambualte at least 226 feet with least restrictive assistive device in 2 minutes to demonstrate improved walking endurance.    Time 3    Period Weeks    Status Achieved    Target Date 02/17/21      PT SHORT TERM GOAL #4   Title Patient will achieve 0-100 degrees in right knee to demonstrate improved knee motion    Time 3    Period Weeks    Status Achieved    Target Date 02/17/21             PT Long Term Goals - 02/19/21 1321      PT LONG TERM GOAL #1   Title Patient will report at least 75% improvement in overall symptoms and/or function to demonstrate improved functional mobility    Time 6    Period Weeks    Status On-going      PT LONG TERM GOAL #2   Title Patient will improve on FOTO score to meet predicted outcomes to demonstrate improved functional mobility.    Time 6    Period Weeks    Status On-going      PT LONG TERM GOAL #3   Title Patient will be able to ambualte at least 226 feet without assistive device in 2 minutes to demonstrate improved walking endurance.    Time 6    Period Weeks    Status Achieved      PT LONG  TERM GOAL #4   Title Patient will achieve 0-110 degrees in right knee to demonstrate improved knee motion    Time 6    Period Weeks    Status On-going                 Plan - 02/24/21 1656    Clinical Impression Statement PT treatment today focused on improving ROM.  Today AROM 0-115    Personal Factors and Comorbidities Comorbidity 1    Comorbidities hx of rigth knee surgery    Examination-Activity Limitations Bathing;Sleep;Bed Mobility;Squat;Stairs;Stand;Transfers;Locomotion Level;Bend;Sit    Examination-Participation Restrictions Cleaning;Community Activity;Driving;Meal Prep;Occupation    Stability/Clinical Decision Making Stable/Uncomplicated    Rehab Potential Good    PT Frequency 3x / week    PT Duration 6 weeks    PT Treatment/Interventions ADLs/Self Care Home Management;Aquatic Therapy;Cryotherapy;Electrical Stimulation;Moist Heat;Traction;Balance training;Therapeutic exercise;Therapeutic activities;Functional mobility training;Stair training;Gait training;DME Instruction;Neuromuscular re-education;Patient/family education;Manual techniques;Dry needling;Passive range of motion;Joint Manipulations    PT Next Visit Plan continue to work on squats, box lift, balance    PT Home Exercise Plan elevation, compression garments, heel slides 5/4 LAQ, SLR, heel slides; 5/11 knee flexion, prone quad stretch; 5/18 STS with equal weight on right LE    Consulted and Agree with Plan of Care Patient  Patient will benefit from skilled therapeutic intervention in order to improve the following deficits and impairments:  Abnormal gait,Decreased endurance,Decreased skin integrity,Pain,Increased edema,Decreased scar mobility,Decreased knowledge of precautions,Decreased balance,Decreased mobility,Difficulty walking,Decreased range of motion,Decreased strength,Decreased activity tolerance  Visit Diagnosis: Difficulty in walking, not elsewhere classified  Muscle weakness  (generalized)  Acute pain of right knee  Stiffness of left knee, not elsewhere classified     Problem List Patient Active Problem List   Diagnosis Date Noted  . S/P total knee arthroplasty, right 01/23/2021  . IIH (idiopathic intracranial hypertension) 10/14/2020  . Discoloration of skin of finger 09/12/2020  . Bilateral hand pain 09/12/2020  . Paresthesia of skin 09/12/2020  . Chronic migraine without aura without status migrainosus, not intractable 06/22/2020  . Iron deficiency anemia 06/20/2019  . S/P left TKA 06/06/2019  . Status post total left knee replacement 06/06/2019  . Special screening for malignant neoplasms, colon 03/28/2019  . LLQ abdominal pain 09/26/2018  . Derangement of anterior horn of lateral meniscus of left knee   . Derangement of posterior horn of medial meniscus of left knee   . Chest pain 04/25/2016  . Hypokalemia 04/25/2016  . Hyperglycemia 04/25/2016  . Diverticulitis of colon 07/17/2014  . Nausea without vomiting 07/17/2014  . Crohn disease ([place]) 07/17/2014  . History of arthroscopy of right knee 07/25/2012  . Difficulty in walking(719.7) 06/29/2012  . Weakness of right leg 06/29/2012  . Posterior tibial tendonitis 11/11/2011  . PTTD (posterior tibial tendon dysfunction) 11/11/2011  . Knee pain, right 11/11/2011  . Chest pain 07/16/2011  . Primary osteoarthritis of left knee 02/25/2010  . PAIN IN JOINT, MULTIPLE SITES 02/25/2010  . Essential hypertension 08/13/2009  . HIATAL HERNIA 08/13/2009  . PALPITATIONS 08/13/2009  . DYSPNEA 08/13/2009    Rayetta Humphrey, PT CLT [phone] 02/24/2021, 5:10 PM  [facility] [address] [place], [place], [address] Phone: [phone]   Fax:  [phone]  Name: [name] MRN: [id number] Date of Birth: 09-Feb-1966

## 2021-02-26 ENCOUNTER — Other Ambulatory Visit: Payer: Self-pay

## 2021-02-26 ENCOUNTER — Ambulatory Visit (HOSPITAL_COMMUNITY): Payer: 59 | Admitting: Physical Therapy

## 2021-02-26 DIAGNOSIS — M6281 Muscle weakness (generalized): Secondary | ICD-10-CM

## 2021-02-26 DIAGNOSIS — M25561 Pain in right knee: Secondary | ICD-10-CM | POA: Diagnosis not present

## 2021-02-26 DIAGNOSIS — M25662 Stiffness of left knee, not elsewhere classified: Secondary | ICD-10-CM | POA: Diagnosis not present

## 2021-02-26 DIAGNOSIS — R262 Difficulty in walking, not elsewhere classified: Secondary | ICD-10-CM

## 2021-02-26 NOTE — Therapy (Signed)
[facility] [address] [place], [place], [address] Phone: [phone]   Fax:  [phone]  Physical Therapy Treatment  Patient Details  Name: LENOLA LOCKNER MRN: [id number] Date of Birth: 1966-02-26 Referring Provider (PT): Paralee Cancel, MD   Encounter Date: 02/26/2021   PT End of Session - 02/26/21 1213    Visit Number 13    Number of Visits 18    Date for PT Re-Evaluation 03/10/21    Authorization Type [employer] - no auth or VL but review at the 25th visit    Authorization - Visit Number 13    Authorization - Number of Visits 25    Progress Note Due on Visit 20    PT Start Time 1054    PT Stop Time 1140    PT Time Calculation (min) 46 min    Equipment Utilized During Treatment Gait belt    Activity Tolerance Patient tolerated treatment well    Behavior During Therapy [facility] for tasks assessed/performed           Past Medical History:  Diagnosis Date  . Anxiety   . Arthritis   . Asthma   . Cervical hyperplasia    hisotry of  . Chest pain    cath 2005 norm cors, repeat cath Feb 2011 normal cors and only minimally  elevated pulmonary pressures  . Crohn disease ([place])   . Diverticulitis of colon 07/17/2014  . Diverticulosis   . Dyspnea   . GERD (gastroesophageal reflux disease)   . Hiatal hernia   . History of iron deficiency anemia   . Hypertension   . Idiopathic intracranial hypertension    Per patient  . Migraines   . Obesity   . Palpitations   . Pneumonia 2008   14 days hospitalization, bilateral  . PONV (postoperative nausea and vomiting)   . Sleep apnea sleep study 11/03/2009    cpap; does sometimes, doesnt use every night. weight loss no longer using CPAP  . Vitamin B deficiency     Past Surgical History:  Procedure Laterality Date  . ABDOMINAL HYSTERECTOMY    . ANKLE ARTHROSCOPY  06/01/2012   Procedure: ANKLE ARTHROSCOPY;  Surgeon: Colin Rhein, MD;  Location: [place];  Service:  Orthopedics;  Laterality: Left;  left ankle arthorsocpy with extensive debridement and gastroc slide  . ANKLE SURGERY Left 05/11/2018  . BIOPSY  04/26/2019   Procedure: BIOPSY;  Surgeon: Rogene Houston, MD;  [facility];  Service: Endoscopy;;  ileum colon  . CARDIAC CATHETERIZATION  11/22/2009 and 2005   WNL  . CESAREAN SECTION    . CHOLECYSTECTOMY  09/08/2006   lap. chole.  . CHONDROPLASTY  06/17/2012   Procedure: CHONDROPLASTY;  Surgeon: Carole Civil, MD;  [facility];  Service: Orthopedics;  Laterality: Right;  right patella  . COLONOSCOPY N/A 04/26/2019   Procedure: COLONOSCOPY;  Surgeon: Rogene Houston, MD;  [facility];  Service: Endoscopy;  Laterality: N/A;  100  . ESOPHAGOGASTRODUODENOSCOPY N/A 05/14/2015   Procedure: ESOPHAGOGASTRODUODENOSCOPY (EGD);  Surgeon: Rogene Houston, MD;  [facility];  Service: Endoscopy;  Laterality: N/A;  730  . GIVENS CAPSULE STUDY N/A 05/24/2019   Procedure: GIVENS CAPSULE STUDY;  Surgeon: Rogene Houston, MD;  [facility];  Service: Endoscopy;  Laterality: N/A;  7:30  . INGUINAL HERNIA REPAIR  10/30/2008   right  . KNEE ARTHROSCOPY Right 2013  . KNEE  ARTHROSCOPY WITH LATERAL MENISECTOMY Left 12/23/2016   Procedure: LEFT KNEE ARTHROSCOPY WITH LATERAL MENISECTOMY;  Surgeon: Carole Civil, MD;  [facility];  Service: Orthopedics;  Laterality: Left;  . KNEE ARTHROSCOPY WITH MEDIAL MENISECTOMY Left 12/23/2016   Procedure: LEFT KNEE ARTHROSCOPY WITH MEDIAL MENISECTOMY CHONDROPLASTY PATELLA  AND MEDIAL FEMORAL CONDYLE LEFT KNEE;  Surgeon: Carole Civil, MD;  [facility];  Service: Orthopedics;  Laterality: Left;  . SHOULDER ARTHROSCOPY WITH SUBACROMIAL DECOMPRESSION AND BICEP TENDON REPAIR Left 12/21/2017   Procedure: Left shoulder arthroscopic biceps tenodesis, SAD, DCR and labrum debridement;  Surgeon: Nicholes Stairs, MD;  Location: [facility];  Service: Orthopedics;  Laterality:  Left;  120 mins  . SHOULDER SURGERY     right x 2   . TOTAL KNEE ARTHROPLASTY Left 06/06/2019   Procedure: TOTAL KNEE ARTHROPLASTY;  Surgeon: Paralee Cancel, MD;  [facility];  Service: Orthopedics;  Laterality: Left;  70 mins  . TOTAL KNEE ARTHROPLASTY Right 01/23/2021   Procedure: TOTAL KNEE ARTHROPLASTY;  Surgeon: Paralee Cancel, MD;  [facility];  Service: Orthopedics;  Laterality: Right;  70 mins    There were no vitals filed for this visit.   Subjective Assessment - 02/26/21 1211    Subjective pt states she feels she's taken a step back and her knee feels more stiff today since last visit.  Reports also with some discomfort in knee as well.    Currently in Pain? Yes    Pain Score 5     Pain Location Knee    Pain Orientation Right;Medial    Pain Descriptors / Indicators Aching;Tender                             [facility] Adult PT Treatment/Exercise - 02/26/21 0001      Ambulation/Gait   Ambulation/Gait Yes    Ambulation/Gait Assistance 6: Modified independent (Device/Increase time);5: Supervision      Knee/Hip Exercises: Stretches   Knee: Self-Stretch to increase Flexion Right;10 seconds    Knee: Self-Stretch Limitations 10 reps on 12" step    Gastroc Stretch Both;2 reps;60 seconds    Gastroc Stretch Limitations slantboard      Knee/Hip Exercises: Aerobic   Recumbent Bike bike for ROM seat at 20 forward revolution      Knee/Hip Exercises: Standing   Functional Squat 10 reps;2 sets      Knee/Hip Exercises: Supine   Quad Sets AROM;Right;10 reps    Knee Extension AROM    Knee Extension Limitations 0    Knee Flexion AROM    Knee Flexion Limitations 100      Knee/Hip Exercises: Prone   Hamstring Curl 10 reps    Other Prone Exercises contract relax for flexion      Manual Therapy   Manual Therapy Joint mobilization;Passive ROM    Manual therapy comments Manual complete separate than rest of tx    Joint Mobilization contract relax and patella mobs  to increase ROM    Passive ROM to increase ROM                    PT Short Term Goals - 02/19/21 1321      PT SHORT TERM GOAL #1   Title Patient will be independent in self management strategies to improve quality of life and functional outcomes.    Time 3    Period Weeks    Status Achieved    Target  Date 02/17/21      PT SHORT TERM GOAL #2   Title Patient will report at least 50% improvement in overall symptoms and/or function to demonstrate improved functional mobility    Time 3    Period Weeks    Status Achieved    Target Date 02/17/21      PT SHORT TERM GOAL #3   Title Patient will be able to ambualte at least 226 feet with least restrictive assistive device in 2 minutes to demonstrate improved walking endurance.    Time 3    Period Weeks    Status Achieved    Target Date 02/17/21      PT SHORT TERM GOAL #4   Title Patient will achieve 0-100 degrees in right knee to demonstrate improved knee motion    Time 3    Period Weeks    Status Achieved    Target Date 02/17/21             PT Long Term Goals - 02/19/21 1321      PT LONG TERM GOAL #1   Title Patient will report at least 75% improvement in overall symptoms and/or function to demonstrate improved functional mobility    Time 6    Period Weeks    Status On-going      PT LONG TERM GOAL #2   Title Patient will improve on FOTO score to meet predicted outcomes to demonstrate improved functional mobility.    Time 6    Period Weeks    Status On-going      PT LONG TERM GOAL #3   Title Patient will be able to ambualte at least 226 feet without assistive device in 2 minutes to demonstrate improved walking endurance.    Time 6    Period Weeks    Status Achieved      PT LONG TERM GOAL #4   Title Patient will achieve 0-110 degrees in right knee to demonstrate improved knee motion    Time 6    Period Weeks    Status On-going                 Plan - 02/26/21 1211    Clinical Impression  Statement Pt showed late for appt this session.  Continued with focus on improving Rt knee flexion, reducing pain and overall functional strength.  Pt with increased stiffness beginning session with inability to make a full revolution on bike at normal seat adjustment but increased to level 20 this session.  Continued with stretches and did not add any weight to exercises.  Unable to get any more than 100 degrees flexion for AAROM of Rt knee and this was following addition of contract relax.  Encouraged to focus more on this for HEP at this time.    Personal Factors and Comorbidities Comorbidity 1    Comorbidities hx of rigth knee surgery    Examination-Activity Limitations Bathing;Sleep;Bed Mobility;Squat;Stairs;Stand;Transfers;Locomotion Level;Bend;Sit    Examination-Participation Restrictions Cleaning;Community Activity;Driving;Meal Prep;Occupation    Stability/Clinical Decision Making Stable/Uncomplicated    Rehab Potential Good    PT Frequency 3x / week    PT Duration 6 weeks    PT Treatment/Interventions ADLs/Self Care Home Management;Aquatic Therapy;Cryotherapy;Electrical Stimulation;Moist Heat;Traction;Balance training;Therapeutic exercise;Therapeutic activities;Functional mobility training;Stair training;Gait training;DME Instruction;Neuromuscular re-education;Patient/family education;Manual techniques;Dry needling;Passive range of motion;Joint Manipulations    PT Next Visit Plan continue to work on squats, box lift, balance; increasing Rt knee flexion    PT Home Exercise Plan elevation, compression garments, heel slides 5/4 LAQ,  SLR, heel slides; 5/11 knee flexion, prone quad stretch; 5/18 STS with equal weight on right LE    Consulted and Agree with Plan of Care Patient           Patient will benefit from skilled therapeutic intervention in order to improve the following deficits and impairments:  Abnormal gait,Decreased endurance,Decreased skin integrity,Pain,Increased edema,Decreased  scar mobility,Decreased knowledge of precautions,Decreased balance,Decreased mobility,Difficulty walking,Decreased range of motion,Decreased strength,Decreased activity tolerance  Visit Diagnosis: Difficulty in walking, not elsewhere classified  Muscle weakness (generalized)  Acute pain of right knee  Stiffness of left knee, not elsewhere classified     Problem List Patient Active Problem List   Diagnosis Date Noted  . S/P total knee arthroplasty, right 01/23/2021  . IIH (idiopathic intracranial hypertension) 10/14/2020  . Discoloration of skin of finger 09/12/2020  . Bilateral hand pain 09/12/2020  . Paresthesia of skin 09/12/2020  . Chronic migraine without aura without status migrainosus, not intractable 06/22/2020  . Iron deficiency anemia 06/20/2019  . S/P left TKA 06/06/2019  . Status post total left knee replacement 06/06/2019  . Special screening for malignant neoplasms, colon 03/28/2019  . LLQ abdominal pain 09/26/2018  . Derangement of anterior horn of lateral meniscus of left knee   . Derangement of posterior horn of medial meniscus of left knee   . Chest pain 04/25/2016  . Hypokalemia 04/25/2016  . Hyperglycemia 04/25/2016  . Diverticulitis of colon 07/17/2014  . Nausea without vomiting 07/17/2014  . Crohn disease ([place]) 07/17/2014  . History of arthroscopy of right knee 07/25/2012  . Difficulty in walking(719.7) 06/29/2012  . Weakness of right leg 06/29/2012  . Posterior tibial tendonitis 11/11/2011  . PTTD (posterior tibial tendon dysfunction) 11/11/2011  . Knee pain, right 11/11/2011  . Chest pain 07/16/2011  . Primary osteoarthritis of left knee 02/25/2010  . PAIN IN JOINT, MULTIPLE SITES 02/25/2010  . Essential hypertension 08/13/2009  . HIATAL HERNIA 08/13/2009  . PALPITATIONS 08/13/2009  . DYSPNEA 08/13/2009   Teena Irani, PTA/CLT [phone]  Teena Irani 02/26/2021, 12:14 PM  [facility] [address] [place], [place], [address] Phone: [phone]   Fax:  [phone]  Name: SAAMIYA JEPPSEN MRN: [id number] Date of Birth: 1966/08/20

## 2021-02-27 ENCOUNTER — Other Ambulatory Visit (HOSPITAL_COMMUNITY): Payer: Self-pay

## 2021-02-28 DIAGNOSIS — E538 Deficiency of other specified B group vitamins: Secondary | ICD-10-CM | POA: Diagnosis not present

## 2021-03-04 ENCOUNTER — Other Ambulatory Visit: Payer: Self-pay

## 2021-03-04 ENCOUNTER — Ambulatory Visit (HOSPITAL_COMMUNITY): Payer: 59

## 2021-03-04 ENCOUNTER — Encounter (HOSPITAL_COMMUNITY): Payer: Self-pay

## 2021-03-04 DIAGNOSIS — R262 Difficulty in walking, not elsewhere classified: Secondary | ICD-10-CM

## 2021-03-04 DIAGNOSIS — M6281 Muscle weakness (generalized): Secondary | ICD-10-CM

## 2021-03-04 DIAGNOSIS — M25561 Pain in right knee: Secondary | ICD-10-CM | POA: Diagnosis not present

## 2021-03-04 DIAGNOSIS — M25662 Stiffness of left knee, not elsewhere classified: Secondary | ICD-10-CM | POA: Diagnosis not present

## 2021-03-04 NOTE — Therapy (Signed)
[place], [place], [address] Phone: [phone]   Fax:  [phone]  Physical Therapy Treatment  Patient Details  Name: [name] MRN: [id number] Date of Birth: 10/24/1965 Referring Provider (PT): Paralee Cancel, MD   Encounter Date: 03/04/2021   PT End of Session - 03/04/21 0842    Visit Number 14    Number of Visits 18    Date for PT Re-Evaluation 03/10/21    Authorization Type [employer] - no auth or VL but review at the 25th visit    Authorization - Visit Number 14    Authorization - Number of Visits 25    Progress Note Due on Visit 20    PT Start Time [phone]   late arrival   PT Stop Time 0915    PT Time Calculation (min) 39 min    Activity Tolerance Patient tolerated treatment well;Patient limited by pain;No increased pain    Behavior During Therapy WFL for tasks assessed/performed           Past Medical History:  Diagnosis Date  . Anxiety   . Arthritis   . Asthma   . Cervical hyperplasia    hisotry of  . Chest pain    cath 2005 norm cors, repeat cath Feb 2011 normal cors and only minimally  elevated pulmonary pressures  . Crohn disease ([place])   . Diverticulitis of colon 07/17/2014  . Diverticulosis   . Dyspnea   . GERD (gastroesophageal reflux disease)   . Hiatal hernia   . History of iron deficiency anemia   . Hypertension   . Idiopathic intracranial hypertension    Per patient  . Migraines   . Obesity   . Palpitations   . Pneumonia 2008   14 days hospitalization, bilateral  . PONV (postoperative nausea and vomiting)   . Sleep apnea sleep study 11/03/2009    cpap; does sometimes, doesnt use every night. weight loss no longer using CPAP  . Vitamin B deficiency     Past Surgical History:  Procedure Laterality Date  . ABDOMINAL HYSTERECTOMY    . ANKLE ARTHROSCOPY  06/01/2012   Procedure: ANKLE ARTHROSCOPY;  Surgeon: Colin Rhein, MD;  Location: [facility];   Service: Orthopedics;  Laterality: Left;  left ankle arthorsocpy with extensive debridement and gastroc slide  . ANKLE SURGERY Left 05/11/2018  . BIOPSY  04/26/2019   Procedure: BIOPSY;  Surgeon: Rogene Houston, MD;  [facility];  Service: Endoscopy;;  ileum colon  . CARDIAC CATHETERIZATION  11/22/2009 and 2005   WNL  . CESAREAN SECTION    . CHOLECYSTECTOMY  09/08/2006   lap. chole.  . CHONDROPLASTY  06/17/2012   Procedure: CHONDROPLASTY;  Surgeon: Carole Civil, MD;  [facility];  Service: Orthopedics;  Laterality: Right;  right patella  . COLONOSCOPY N/A 04/26/2019   Procedure: COLONOSCOPY;  Surgeon: Rogene Houston, MD;  [facility];  Service: Endoscopy;  Laterality: N/A;  100  . ESOPHAGOGASTRODUODENOSCOPY N/A 05/14/2015   Procedure: ESOPHAGOGASTRODUODENOSCOPY (EGD);  Surgeon: Rogene Houston, MD;  [facility];  Service: Endoscopy;  Laterality: N/A;  730  . GIVENS CAPSULE STUDY N/A 05/24/2019   Procedure: GIVENS CAPSULE STUDY;  Surgeon: Rogene Houston, MD;  [facility];  Service: Endoscopy;  Laterality: N/A;  7:30  . INGUINAL HERNIA REPAIR  10/30/2008   right  . KNEE ARTHROSCOPY Right 2013  . KNEE ARTHROSCOPY  WITH LATERAL MENISECTOMY Left 12/23/2016   Procedure: LEFT KNEE ARTHROSCOPY WITH LATERAL MENISECTOMY;  Surgeon: Carole Civil, MD;  [facility];  Service: Orthopedics;  Laterality: Left;  . KNEE ARTHROSCOPY WITH MEDIAL MENISECTOMY Left 12/23/2016   Procedure: LEFT KNEE ARTHROSCOPY WITH MEDIAL MENISECTOMY CHONDROPLASTY PATELLA  AND MEDIAL FEMORAL CONDYLE LEFT KNEE;  Surgeon: Carole Civil, MD;  [facility];  Service: Orthopedics;  Laterality: Left;  . SHOULDER ARTHROSCOPY WITH SUBACROMIAL DECOMPRESSION AND BICEP TENDON REPAIR Left 12/21/2017   Procedure: Left shoulder arthroscopic biceps tenodesis, SAD, DCR and labrum debridement;  Surgeon: Nicholes Stairs, MD;  Location: [facility];  Service: Orthopedics;   Laterality: Left;  120 mins  . SHOULDER SURGERY     right x 2   . TOTAL KNEE ARTHROPLASTY Left 06/06/2019   Procedure: TOTAL KNEE ARTHROPLASTY;  Surgeon: Paralee Cancel, MD;  [facility];  Service: Orthopedics;  Laterality: Left;  70 mins  . TOTAL KNEE ARTHROPLASTY Right 01/23/2021   Procedure: TOTAL KNEE ARTHROPLASTY;  Surgeon: Paralee Cancel, MD;  [facility];  Service: Orthopedics;  Laterality: Right;  70 mins    There were no vitals filed for this visit.   Subjective Assessment - 03/04/21 0840    Subjective Pt reports increased swelling since last week, increased pain and feels she is not walking as smooth.    Patient Stated Goals to be able to walk without waler    Currently in Pain? Yes    Pain Score 6     Pain Location Knee    Pain Orientation Right    Pain Descriptors / Indicators Aching;Tightness    Pain Type Surgical pain    Pain Onset 1 to 4 weeks ago    Pain Frequency Constant    Aggravating Factors  bending, walking    Pain Relieving Factors rest, medication                             [facility] Adult PT Treatment/Exercise - 03/04/21 0001      Exercises   Exercises Knee/Hip      Knee/Hip Exercises: Stretches   Quad Stretch Right;3 reps;30 seconds    Quad Stretch Limitations prone      Knee/Hip Exercises: Aerobic   Recumbent Bike bike for ROM seat at 18 forward revolution      Knee/Hip Exercises: Standing   Functional Squat 10 reps      Knee/Hip Exercises: Supine   Heel Slides 10 reps      Knee/Hip Exercises: Prone   Hamstring Curl 10 reps    Contract/Relax to Increase Flexion 5x      Manual Therapy   Manual Therapy Edema management;Manual Lymphatic Drainage (MLD);Passive ROM    Manual therapy comments Manual complete separate than rest of tx    Edema Management Manual decongestive techniques with LE elevated    Joint Mobilization contract relax and patella mobs to increase ROM                    PT Short Term Goals -  02/19/21 1321      PT SHORT TERM GOAL #1   Title Patient will be independent in self management strategies to improve quality of life and functional outcomes.    Time 3    Period Weeks    Status Achieved    Target Date 02/17/21      PT SHORT TERM GOAL #2   Title Patient  will report at least 50% improvement in overall symptoms and/or function to demonstrate improved functional mobility    Time 3    Period Weeks    Status Achieved    Target Date 02/17/21      PT SHORT TERM GOAL #3   Title Patient will be able to ambualte at least 226 feet with least restrictive assistive device in 2 minutes to demonstrate improved walking endurance.    Time 3    Period Weeks    Status Achieved    Target Date 02/17/21      PT SHORT TERM GOAL #4   Title Patient will achieve 0-100 degrees in right knee to demonstrate improved knee motion    Time 3    Period Weeks    Status Achieved    Target Date 02/17/21             PT Long Term Goals - 02/19/21 1321      PT LONG TERM GOAL #1   Title Patient will report at least 75% improvement in overall symptoms and/or function to demonstrate improved functional mobility    Time 6    Period Weeks    Status On-going      PT LONG TERM GOAL #2   Title Patient will improve on FOTO score to meet predicted outcomes to demonstrate improved functional mobility.    Time 6    Period Weeks    Status On-going      PT LONG TERM GOAL #3   Title Patient will be able to ambualte at least 226 feet without assistive device in 2 minutes to demonstrate improved walking endurance.    Time 6    Period Weeks    Status Achieved      PT LONG TERM GOAL #4   Title Patient will achieve 0-110 degrees in right knee to demonstrate improved knee motion    Time 6    Period Weeks    Status On-going                 Plan - 03/04/21 4034    Clinical Impression Statement Pt limited by pain and increased edema proximal knee.  Session focus on knee mobility and reducing  pain.  Began session with manual decogestive techniques for edema control prior ROM based exercises.  AROM 0-110 degrees.    Personal Factors and Comorbidities Comorbidity 1    Comorbidities hx of rigth knee surgery    Examination-Activity Limitations Bathing;Sleep;Bed Mobility;Squat;Stairs;Stand;Transfers;Locomotion Level;Bend;Sit    Examination-Participation Restrictions Cleaning;Community Activity;Driving;Meal Prep;Occupation    Stability/Clinical Decision Making Stable/Uncomplicated    Clinical Decision Making Low    Rehab Potential Good    PT Frequency 3x / week    PT Duration 6 weeks    PT Treatment/Interventions ADLs/Self Care Home Management;Aquatic Therapy;Cryotherapy;Electrical Stimulation;Moist Heat;Traction;Balance training;Therapeutic exercise;Therapeutic activities;Functional mobility training;Stair training;Gait training;DME Instruction;Neuromuscular re-education;Patient/family education;Manual techniques;Dry needling;Passive range of motion;Joint Manipulations    PT Next Visit Plan continue to work on squats, box lift, balance; increasing Rt knee flexion    PT Home Exercise Plan elevation, compression garments, heel slides 5/4 LAQ, SLR, heel slides; 5/11 knee flexion, prone quad stretch; 5/18 STS with equal weight on right LE    Consulted and Agree with Plan of Care Patient           Patient will benefit from skilled therapeutic intervention in order to improve the following deficits and impairments:  Abnormal gait,Decreased endurance,Decreased skin integrity,Pain,Increased edema,Decreased scar mobility,Decreased knowledge of precautions,Decreased balance,Decreased mobility,Difficulty walking,Decreased  range of motion,Decreased strength,Decreased activity tolerance  Visit Diagnosis: Difficulty in walking, not elsewhere classified  Muscle weakness (generalized)  Acute pain of right knee     Problem List Patient Active Problem List   Diagnosis Date Noted  . S/P total  knee arthroplasty, right 01/23/2021  . IIH (idiopathic intracranial hypertension) 10/14/2020  . Discoloration of skin of finger 09/12/2020  . Bilateral hand pain 09/12/2020  . Paresthesia of skin 09/12/2020  . Chronic migraine without aura without status migrainosus, not intractable 06/22/2020  . Iron deficiency anemia 06/20/2019  . S/P left TKA 06/06/2019  . Status post total left knee replacement 06/06/2019  . Special screening for malignant neoplasms, colon 03/28/2019  . LLQ abdominal pain 09/26/2018  . Derangement of anterior horn of lateral meniscus of left knee   . Derangement of posterior horn of medial meniscus of left knee   . Chest pain 04/25/2016  . Hypokalemia 04/25/2016  . Hyperglycemia 04/25/2016  . Diverticulitis of colon 07/17/2014  . Nausea without vomiting 07/17/2014  . Crohn disease ([place]) 07/17/2014  . History of arthroscopy of right knee 07/25/2012  . Difficulty in walking(719.7) 06/29/2012  . Weakness of right leg 06/29/2012  . Posterior tibial tendonitis 11/11/2011  . PTTD (posterior tibial tendon dysfunction) 11/11/2011  . Knee pain, right 11/11/2011  . Chest pain 07/16/2011  . Primary osteoarthritis of left knee 02/25/2010  . PAIN IN JOINT, MULTIPLE SITES 02/25/2010  . Essential hypertension 08/13/2009  . HIATAL HERNIA 08/13/2009  . PALPITATIONS 08/13/2009  . DYSPNEA 08/13/2009   Ihor Austin, LPTA/CLT; CBIS [phone]  Aldona Lento 03/04/2021, 9:21 AM  [place], [place], [address] Phone: [phone]   Fax:  [phone]  Name: JAMARIS BIERNAT MRN: [id number] Date of Birth: Mar 06, 1966

## 2021-03-06 ENCOUNTER — Ambulatory Visit (HOSPITAL_COMMUNITY): Payer: 59 | Attending: Student | Admitting: Physical Therapy

## 2021-03-06 ENCOUNTER — Other Ambulatory Visit: Payer: Self-pay

## 2021-03-06 ENCOUNTER — Encounter (HOSPITAL_COMMUNITY): Payer: Self-pay | Admitting: Physical Therapy

## 2021-03-06 DIAGNOSIS — M25561 Pain in right knee: Secondary | ICD-10-CM | POA: Diagnosis not present

## 2021-03-06 DIAGNOSIS — R262 Difficulty in walking, not elsewhere classified: Secondary | ICD-10-CM | POA: Insufficient documentation

## 2021-03-06 DIAGNOSIS — M6281 Muscle weakness (generalized): Secondary | ICD-10-CM | POA: Diagnosis not present

## 2021-03-06 NOTE — Therapy (Signed)
[place], [place], [address] Phone: [phone]   Fax:  [phone]  Physical Therapy Treatment  Patient Details  Name: [name] MRN: [id number] Date of Birth: 09-28-1966 Referring Provider (PT): Paralee Cancel, MD   Encounter Date: 03/06/2021   PT End of Session - 03/06/21 0859    Visit Number 15    Number of Visits 18    Date for PT Re-Evaluation 03/10/21    Authorization Type [employer] - no auth or VL but review at the 25th visit    Authorization - Visit Number 15    Authorization - Number of Visits 25    Progress Note Due on Visit 20    PT Start Time [phone]    PT Stop Time 0920    PT Time Calculation (min) 41 min    Activity Tolerance Patient tolerated treatment well;Patient limited by pain;No increased pain    Behavior During Therapy WFL for tasks assessed/performed           Past Medical History:  Diagnosis Date  . Anxiety   . Arthritis   . Asthma   . Cervical hyperplasia    hisotry of  . Chest pain    cath 2005 norm cors, repeat cath Feb 2011 normal cors and only minimally  elevated pulmonary pressures  . Crohn disease ([place])   . Diverticulitis of colon 07/17/2014  . Diverticulosis   . Dyspnea   . GERD (gastroesophageal reflux disease)   . Hiatal hernia   . History of iron deficiency anemia   . Hypertension   . Idiopathic intracranial hypertension    Per patient  . Migraines   . Obesity   . Palpitations   . Pneumonia 2008   14 days hospitalization, bilateral  . PONV (postoperative nausea and vomiting)   . Sleep apnea sleep study 11/03/2009    cpap; does sometimes, doesnt use every night. weight loss no longer using CPAP  . Vitamin B deficiency     Past Surgical History:  Procedure Laterality Date  . ABDOMINAL HYSTERECTOMY    . ANKLE ARTHROSCOPY  06/01/2012   Procedure: ANKLE ARTHROSCOPY;  Surgeon: Colin Rhein, MD;  Location: [facility];  Service:  Orthopedics;  Laterality: Left;  left ankle arthorsocpy with extensive debridement and gastroc slide  . ANKLE SURGERY Left 05/11/2018  . BIOPSY  04/26/2019   Procedure: BIOPSY;  Surgeon: Rogene Houston, MD;  [facility];  Service: Endoscopy;;  ileum colon  . CARDIAC CATHETERIZATION  11/22/2009 and 2005   WNL  . CESAREAN SECTION    . CHOLECYSTECTOMY  09/08/2006   lap. chole.  . CHONDROPLASTY  06/17/2012   Procedure: CHONDROPLASTY;  Surgeon: Carole Civil, MD;  [facility];  Service: Orthopedics;  Laterality: Right;  right patella  . COLONOSCOPY N/A 04/26/2019   Procedure: COLONOSCOPY;  Surgeon: Rogene Houston, MD;  [facility];  Service: Endoscopy;  Laterality: N/A;  100  . ESOPHAGOGASTRODUODENOSCOPY N/A 05/14/2015   Procedure: ESOPHAGOGASTRODUODENOSCOPY (EGD);  Surgeon: Rogene Houston, MD;  [facility];  Service: Endoscopy;  Laterality: N/A;  730  . GIVENS CAPSULE STUDY N/A 05/24/2019   Procedure: GIVENS CAPSULE STUDY;  Surgeon: Rogene Houston, MD;  [facility];  Service: Endoscopy;  Laterality: N/A;  7:30  . INGUINAL HERNIA REPAIR  10/30/2008   right  . KNEE ARTHROSCOPY Right 2013  . KNEE ARTHROSCOPY WITH LATERAL MENISECTOMY  Left 12/23/2016   Procedure: LEFT KNEE ARTHROSCOPY WITH LATERAL MENISECTOMY;  Surgeon: Carole Civil, MD;  [facility];  Service: Orthopedics;  Laterality: Left;  . KNEE ARTHROSCOPY WITH MEDIAL MENISECTOMY Left 12/23/2016   Procedure: LEFT KNEE ARTHROSCOPY WITH MEDIAL MENISECTOMY CHONDROPLASTY PATELLA  AND MEDIAL FEMORAL CONDYLE LEFT KNEE;  Surgeon: Carole Civil, MD;  [facility];  Service: Orthopedics;  Laterality: Left;  . SHOULDER ARTHROSCOPY WITH SUBACROMIAL DECOMPRESSION AND BICEP TENDON REPAIR Left 12/21/2017   Procedure: Left shoulder arthroscopic biceps tenodesis, SAD, DCR and labrum debridement;  Surgeon: Nicholes Stairs, MD;  Location: [facility];  Service: Orthopedics;  Laterality:  Left;  120 mins  . SHOULDER SURGERY     right x 2   . TOTAL KNEE ARTHROPLASTY Left 06/06/2019   Procedure: TOTAL KNEE ARTHROPLASTY;  Surgeon: Paralee Cancel, MD;  [facility];  Service: Orthopedics;  Laterality: Left;  70 mins  . TOTAL KNEE ARTHROPLASTY Right 01/23/2021   Procedure: TOTAL KNEE ARTHROPLASTY;  Surgeon: Paralee Cancel, MD;  [facility];  Service: Orthopedics;  Laterality: Right;  70 mins    There were no vitals filed for this visit.   Subjective Assessment - 03/06/21 0840    Subjective Pt states that after last Monday her knee is feeling better.    Pertinent History previous knee surgery    Patient Stated Goals to be able to walk without waler    Currently in Pain? Yes    Pain Score 4     Pain Location Knee    Pain Orientation Right    Pain Descriptors / Indicators Aching    Pain Type Surgical pain    Pain Onset 1 to 4 weeks ago    Pain Frequency Constant    Aggravating Factors  bending    Pain Relieving Factors rest                             [facility] Adult PT Treatment/Exercise - 03/06/21 0001      Exercises   Exercises Knee/Hip      Knee/Hip Exercises: Stretches   Quad Stretch Right;3 reps;30 seconds    Quad Stretch Limitations prone    Knee: Self-Stretch to increase Flexion 20 seconds;Both    Gastroc Stretch Both;2 reps;60 seconds      Knee/Hip Exercises: Aerobic   Recumbent Bike bike for ROM seat at 18 forward revolution   done after treatment     Knee/Hip Exercises: Standing   Heel Raises Both;10 reps;5 seconds    Knee Flexion Strengthening;Right;10 reps    Forward Step Up Right;10 reps    Functional Squat 10 reps    Rocker Board 2 minutes    SLS x3, 2 fingertip hold x 3 B      Knee/Hip Exercises: Supine   Knee Extension AROM    Knee Extension Limitations 0    Knee Flexion AROM    Knee Flexion Limitations 110      Knee/Hip Exercises: Prone   Hip Extension Both;10 reps                  PT Education - 03/06/21  0910    Education Details self manual    Person(s) Educated Patient    Methods Explanation    Comprehension Verbalized understanding            PT Short Term Goals - 02/19/21 1321      PT SHORT TERM GOAL #  1   Title Patient will be independent in self management strategies to improve quality of life and functional outcomes.    Time 3    Period Weeks    Status Achieved    Target Date 02/17/21      PT SHORT TERM GOAL #2   Title Patient will report at least 50% improvement in overall symptoms and/or function to demonstrate improved functional mobility    Time 3    Period Weeks    Status Achieved    Target Date 02/17/21      PT SHORT TERM GOAL #3   Title Patient will be able to ambualte at least 226 feet with least restrictive assistive device in 2 minutes to demonstrate improved walking endurance.    Time 3    Period Weeks    Status Achieved    Target Date 02/17/21      PT SHORT TERM GOAL #4   Title Patient will achieve 0-100 degrees in right knee to demonstrate improved knee motion    Time 3    Period Weeks    Status Achieved    Target Date 02/17/21             PT Long Term Goals - 02/19/21 1321      PT LONG TERM GOAL #1   Title Patient will report at least 75% improvement in overall symptoms and/or function to demonstrate improved functional mobility    Time 6    Period Weeks    Status On-going      PT LONG TERM GOAL #2   Title Patient will improve on FOTO score to meet predicted outcomes to demonstrate improved functional mobility.    Time 6    Period Weeks    Status On-going      PT LONG TERM GOAL #3   Title Patient will be able to ambualte at least 226 feet without assistive device in 2 minutes to demonstrate improved walking endurance.    Time 6    Period Weeks    Status Achieved      PT LONG TERM GOAL #4   Title Patient will achieve 0-110 degrees in right knee to demonstrate improved knee motion    Time 6    Period Weeks    Status On-going                  Plan - 03/06/21 0900    Clinical Impression Statement Pt late for appointment.  Treatment focused on both stretching and functional strengthening with good form noted with verbal cuing.  Instructed pt in self manual for edema.    Personal Factors and Comorbidities Comorbidity 1    Comorbidities hx of rigth knee surgery    Examination-Activity Limitations Bathing;Sleep;Bed Mobility;Squat;Stairs;Stand;Transfers;Locomotion Level;Bend;Sit    Examination-Participation Restrictions Cleaning;Community Activity;Driving;Meal Prep;Occupation    Stability/Clinical Decision Making Stable/Uncomplicated    Rehab Potential Good    PT Frequency 3x / week    PT Duration 6 weeks    PT Treatment/Interventions ADLs/Self Care Home Management;Aquatic Therapy;Cryotherapy;Electrical Stimulation;Moist Heat;Traction;Balance training;Therapeutic exercise;Therapeutic activities;Functional mobility training;Stair training;Gait training;DME Instruction;Neuromuscular re-education;Patient/family education;Manual techniques;Dry needling;Passive range of motion;Joint Manipulations    PT Next Visit Plan continue to work on squats, box lift, balance; increasing Rt knee flexion    PT Home Exercise Plan elevation, compression garments, heel slides 5/4 LAQ, SLR, heel slides; 5/11 knee flexion, prone quad stretch; 5/18 STS with equal weight on right LE    Consulted and Agree with Plan of Care Patient  Patient will benefit from skilled therapeutic intervention in order to improve the following deficits and impairments:  Abnormal gait,Decreased endurance,Decreased skin integrity,Pain,Increased edema,Decreased scar mobility,Decreased knowledge of precautions,Decreased balance,Decreased mobility,Difficulty walking,Decreased range of motion,Decreased strength,Decreased activity tolerance  Visit Diagnosis: Difficulty in walking, not elsewhere classified  Muscle weakness (generalized)  Acute pain of right  knee     Problem List Patient Active Problem List   Diagnosis Date Noted  . S/P total knee arthroplasty, right 01/23/2021  . IIH (idiopathic intracranial hypertension) 10/14/2020  . Discoloration of skin of finger 09/12/2020  . Bilateral hand pain 09/12/2020  . Paresthesia of skin 09/12/2020  . Chronic migraine without aura without status migrainosus, not intractable 06/22/2020  . Iron deficiency anemia 06/20/2019  . S/P left TKA 06/06/2019  . Status post total left knee replacement 06/06/2019  . Special screening for malignant neoplasms, colon 03/28/2019  . LLQ abdominal pain 09/26/2018  . Derangement of anterior horn of lateral meniscus of left knee   . Derangement of posterior horn of medial meniscus of left knee   . Chest pain 04/25/2016  . Hypokalemia 04/25/2016  . Hyperglycemia 04/25/2016  . Diverticulitis of colon 07/17/2014  . Nausea without vomiting 07/17/2014  . Crohn disease ([place]) 07/17/2014  . History of arthroscopy of right knee 07/25/2012  . Difficulty in walking(719.7) 06/29/2012  . Weakness of right leg 06/29/2012  . Posterior tibial tendonitis 11/11/2011  . PTTD (posterior tibial tendon dysfunction) 11/11/2011  . Knee pain, right 11/11/2011  . Chest pain 07/16/2011  . Primary osteoarthritis of left knee 02/25/2010  . PAIN IN JOINT, MULTIPLE SITES 02/25/2010  . Essential hypertension 08/13/2009  . HIATAL HERNIA 08/13/2009  . PALPITATIONS 08/13/2009  . DYSPNEA 08/13/2009    Rayetta Humphrey, PT CLT [phone] 03/06/2021, 9:20 AM  [name] [address] [place], [place], [address] Phone: [phone]   Fax:  [phone]  Name: [name] MRN: [id number] Date of Birth: 1966-02-01

## 2021-03-10 ENCOUNTER — Other Ambulatory Visit: Payer: Self-pay | Admitting: Orthopedic Surgery

## 2021-03-10 ENCOUNTER — Other Ambulatory Visit (HOSPITAL_COMMUNITY): Payer: Self-pay

## 2021-03-10 ENCOUNTER — Ambulatory Visit (HOSPITAL_COMMUNITY): Payer: Self-pay

## 2021-03-10 DIAGNOSIS — M5431 Sciatica, right side: Secondary | ICD-10-CM

## 2021-03-10 NOTE — Patient Instructions (Addendum)
Below is our plan:  We will continue Emgality monthly, acetazolamide 545m twice daily, amitriptyline 258mdaily and rizatriptan as needed.   Please make sure you are staying well hydrated. I recommend 50-60 ounces daily. Well balanced diet and regular exercise encouraged. Consistent sleep schedule with 6-8 hours recommended.   Please continue follow up with care team as directed.   Follow up with me in 1 year   You may receive a survey regarding today's visit. I encourage you to leave honest feed back as I do use this information to improve patient care. Thank you for seeing me today!    Idiopathic Intracranial Hypertension  Idiopathic intracranial hypertension (IIH) is a condition that increases pressure around the brain. The fluid that surrounds the brain and spinal cord (cerebrospinal fluid, or CSF) increases and causes the pressure. Idiopathic means that the cause of this condition is not known. IIH affects the brain and spinal cord (neurological disorder). If this condition is not treated, it can cause vision loss or blindness. What are the causes? The cause of this condition is not known. What increases the risk? The following factors may make you more likely to develop this condition:  Being very overweight (obese).  Being a female between the ages of 2033nd [age]ears old, who has not gone through menopause.  Taking certain medicines, such as birth control or steroids. What are the signs or symptoms? Symptoms of this condition include:  Headaches. This is the most common symptom.  Brief episodes of total blindness.  Double vision, blurred vision, or poor side (peripheral) vision.  Pain in the shoulders or neck.  Nausea and vomiting.  A sound like rushing water or a pulsing sound within the ears (pulsatile tinnitus), or ringing in the ears. How is this diagnosed? This condition may be diagnosed based on:  Your symptoms and medical history.  Imaging tests of the  brain, such as: ? CT scan. ? MRI. ? Magnetic resonance venogram (MRV) to check the veins.  Diagnostic lumbar puncture. This is a procedure to remove and examine a sample of cerebrospinal fluid. This procedure can determine whether too much fluid may be causing IIH.  A thorough eye exam to check for swelling or nerve damage in the eyes. How is this treated? Treatment for this condition depends on the symptoms. The goal of treatment is to decrease the pressure around your brain. Common treatments include:  Weight loss through healthy eating, salt restriction, and exercise, if you are overweight.  Medicines to decrease the production of spinal fluid and lower the pressure within your skull.  Medicines to prevent or treat headaches. Other treatments may include:  Surgery to place drains (shunts) in your brain for removing excess fluid.  Lumbar puncture to remove excess cerebrospinal fluid. Follow these instructions at home:  If you are overweight or obese, work with your health care provider to lose weight.  Take over-the-counter and prescription medicines only as told by your health care provider.  Ask your health care provider if the medicine prescribed to you requires you to avoid driving or using machinery.  Do not use any products that contain nicotine or tobacco, such as cigarettes, e-cigarettes, and chewing tobacco. If you need help quitting, ask your health care provider.  Keep all follow-up visits as told by your health care provider. This is important. Contact a health care provider if: You have changes in your vision, such as:  Double vision.  Blurred vision.  Poor peripheral vision. Get help  right away if: You have any of the following symptoms and they get worse or do not get better:  Headaches.  Nausea.  Vomiting.  Sudden trouble seeing. Summary  Idiopathic intracranial hypertension (IIH) is a condition that increases pressure around the brain. The cause  is not known (is idiopathic).  The most common symptom of IIH is headaches. Vision changes, pain in the shoulders or neck, nausea, and vomiting may also occur.  Treatment for this condition depends on your symptoms. The goal of treatment is to decrease the pressure around your brain.  If you are overweight or obese, work with your health care provider to lose weight.  Take over-the-counter and prescription medicines only as told by your health care provider. This information is not intended to replace advice given to you by your health care provider. Make sure you discuss any questions you have with your health care provider. Document Revised: 09/02/2019 Document Reviewed: 09/02/2019 Elsevier Patient Education  2021 [employer].   Migraine Headache A migraine headache is a very strong throbbing pain on one side or both sides of your head. This type of headache can also cause other symptoms. It can last from 4 hours to 3 days. Talk with your doctor about what things may bring on (trigger) this condition. What are the causes? The exact cause of this condition is not known. This condition may be triggered or caused by:  Drinking alcohol.  Smoking.  Taking medicines, such as: ? Medicine used to treat chest pain (nitroglycerin). ? Birth control pills. ? Estrogen. ? Some blood pressure medicines.  Eating or drinking certain products.  Doing physical activity. Other things that may trigger a migraine headache include:  Having a menstrual period.  Pregnancy.  Hunger.  Stress.  Not getting enough sleep or getting too much sleep.  Weather changes.  Tiredness (fatigue). What increases the risk?  Being 80-10 years old.  Being female.  Having a family history of migraine headaches.  Being Caucasian.  Having depression or anxiety.  Being very overweight. What are the signs or symptoms?  A throbbing pain. This pain may: ? Happen in any area of the head, such as on  one side or both sides. ? Make it hard to do daily activities. ? Get worse with physical activity. ? Get worse around bright lights or loud noises.  Other symptoms may include: ? Feeling sick to your stomach (nauseous). ? Vomiting. ? Dizziness. ? Being sensitive to bright lights, loud noises, or smells.  Before you get a migraine headache, you may get warning signs (an aura). An aura may include: ? Seeing flashing lights or having blind spots. ? Seeing bright spots, halos, or zigzag lines. ? Having tunnel vision or blurred vision. ? Having numbness or a tingling feeling. ? Having trouble talking. ? Having weak muscles.  Some people have symptoms after a migraine headache (postdromal phase), such as: ? Tiredness. ? Trouble thinking (concentrating). How is this treated?  Taking medicines that: ? Relieve pain. ? Relieve the feeling of being sick to your stomach. ? Prevent migraine headaches.  Treatment may also include: ? Having acupuncture. ? Avoiding foods that bring on migraine headaches. ? Learning ways to control your body functions (biofeedback). ? Therapy to help you know and deal with negative thoughts (cognitive behavioral therapy). Follow these instructions at home: Medicines  Take over-the-counter and prescription medicines only as told by your doctor.  Ask your doctor if the medicine prescribed to you: ? Requires you to avoid  driving or using heavy machinery. ? Can cause trouble pooping (constipation). You may need to take these steps to prevent or treat trouble pooping:  Drink enough fluid to keep your pee (urine) pale yellow.  Take over-the-counter or prescription medicines.  Eat foods that are high in fiber. These include beans, whole grains, and fresh fruits and vegetables.  Limit foods that are high in fat and sugar. These include fried or sweet foods. Lifestyle  Do not drink alcohol.  Do not use any products that contain nicotine or tobacco, such  as cigarettes, e-cigarettes, and chewing tobacco. If you need help quitting, ask your doctor.  Get at least 8 hours of sleep every night.  Limit and deal with stress. General instructions  Keep a journal to find out what may bring on your migraine headaches. For example, write down: ? What you eat and drink. ? How much sleep you get. ? Any change in what you eat or drink. ? Any change in your medicines.  If you have a migraine headache: ? Avoid things that make your symptoms worse, such as bright lights. ? It may help to lie down in a dark, quiet room. ? Do not drive or use heavy machinery. ? Ask your doctor what activities are safe for you.  Keep all follow-up visits as told by your doctor. This is important.      Contact a doctor if:  You get a migraine headache that is different or worse than others you have had.  You have more than 15 headache days in one month. Get help right away if:  Your migraine headache gets very bad.  Your migraine headache lasts longer than 72 hours.  You have a fever.  You have a stiff neck.  You have trouble seeing.  Your muscles feel weak or like you cannot control them.  You start to lose your balance a lot.  You start to have trouble walking.  You pass out (faint).  You have a seizure. Summary  A migraine headache is a very strong throbbing pain on one side or both sides of your head. These headaches can also cause other symptoms.  This condition may be treated with medicines and changes to your lifestyle.  Keep a journal to find out what may bring on your migraine headaches.  Contact a doctor if you get a migraine headache that is different or worse than others you have had.  Contact your doctor if you have more than 15 headache days in a month. This information is not intended to replace advice given to you by your health care provider. Make sure you discuss any questions you have with your health care provider. Document  Revised: 01/13/2019 Document Reviewed: 11/03/2018 Elsevier Patient Education  [place].

## 2021-03-10 NOTE — Progress Notes (Signed)
Chief Complaint  Patient presents with  . Follow-up    RM 2, alone. HA f/u, pt states she continues to have daily HA, but states they are "controlled". Pressure in back of head has improved.      HISTORY OF PRESENT ILLNESS: 03/11/2021 ALL:  [name] returns for follow up for migraines and IIH. She was taking Ajovy, amitriptyline, acetazolamide and rizatriptan. Ajovy was no longer covered on insurance and she was switched to [employer]. She has had 1 injection. She is tolerating acetazolamide. Amitriptyline helps with sleep. She feels that headaches are well managed. She does have some sort of headache almost every day. She takes rizatriptan maybe once a month. Some months she doesn't have to take it. She had right TKR 01/23/2021. She took oxycodone post surgery but has not taken regularly. She has tried to wean gabapentin. She is having more low back pain and "nerve pain" of right leg. She called ortho for a refill of gabapentin.   10/14/2020 AA: Interval history October 13, 2020(last seen by myself 9/17 for appointment and 10/72021 for emg/ncs and also seen by NP Deronda Christian 07/29/2020: Patient is here for a follow-up of migraines and other multiple neurologic symptoms.  At last appointment she was started on Ajovy and amitriptyline for her migraines, Diamox after LP with elevated opening pressure, rizatriptan for acute migraine management, and an EMG nerve conduction study was performed which did not show anything significant to explain all her other multiple neurologic symptoms(See below) in the setting of mood disorder which may be contributory.  Per [name] the rizatriptan was helping, amitriptyline was helping her sleep.  She also endorsed some decreased intensity of headaches and migraines to starting Ajovy, amitriptyline and Diamox.  [name] continued current treatment.  Since last being seen she was seen by ophthalmology.  Neuro exam is nonfocal.  Her headaches and migraines are feeling  better. She still has the pressure in the head but improved. Increase Diamox, keep her on the Ajovy.   07/29/2020 ALL:  [name] is a 55 y.o. female here today for follow up for headaches. She was started on Ajovy and amitriptyline for migraine prevention as well as rizatriptan for abortive therapy in 06/2020. MRI showed concerns for partially empty sella and she was started on Diamox 261m BID. She had a LP on 07/17/2020 and started on Diamox 07/18/2020. She called 07/22/2020 reporting symptoms of increased pressure and pulsating of head with bilateral lower back pain (not at site of LP). Symptoms lasted for about 2-3 hours. She felt that she was having a hard time walking. She states that head pressure comes and goes. She has taken rizatriptan three times and it does abort migraine. She feels that she has brain fog at times. She has noted some tingling of feet. She is sleeping better after starting amitriptyline.   Low back continues with any prolonged standing. She has numbness and tingling of her lower extremities, worse on left. She reports her feet feel like they are going to sleep. NCS/EMG showed possible remote lumbosacral radiculopathy. She is was previously seeing Dr DEstanislado Pandyfor similar symptoms. Referral was placed back to Dr DEstanislado Pandy    HISTORY (copied from Dr ACathren Lainenote on 06/21/2020)  HPI:  [name] MARCEAUXis a 55y.o. female here as requested by GSharilyn Sites MD for headaches.  Past medical history obesity, headache, vitamin D deficiency.  I reviewed Dr. GDelanna Ahmadinotes which provided no significant history of patient's chief complaint.  She is here today alone, she has been having headaches, started sometime in May, she had a physical at the end of the May and discussed headaches have been going away, also tired no energy, found out she was B12 deficiency and started shots per month.  Headaches are "minute", can also light sensitivity and nausea, she has some GI issues,  Crohn's, has been on steroids.  She also reports nerve pain and stiffness in her hands and feet, she stopped using CPAP due to losing weight and long longer having any problems. She has been having headaches she has pressure in the back of her head at the occipital area and down to her neck, she has tried massage. They start in over the eyes as well. When they are really intense, she is sensitive to light, she has pressure in the back, nausea, pulsating/pulsing/throbbing. She is having the migraines at least 4x a week, they never really go away they just minimize, she will wake up with headaches, she had lost about 40 pounds and she has OSA in the past. She has gained 25 pounds since March, she doesn't sleep, ongoing for about a year after she found her brother dead in 09/08/23. She is fatigued, she has sleep issues. She staets she doesn't sleep all night and go without any sleep for 3 day - she says literally no sleep she turns a lot.  She has vision changes, headache are positional worse supinein the morning, morning headaches. She has muscle cramps, lots of stress. No other focal neurologic deficits, associated symptoms, inciting events or modifiable factors.  Reviewed notes, labs and imaging from outside physicians, which showed:  MRI brain 2017: personally reviewed images and agree with the following:  Brain: No diffusion signal abnormality. Stable partially empty sella turcica. No significant T2 FLAIR signal abnormality. No abnormal enhancement. No abnormal susceptibility hypointensity to indicate intracranial hemorrhage. No focal mass effect. No extra-axial collection. Normal ventricle size.  MRA H/N 09/2016 reviewed reports/findings:  1. No acute intracranial abnormality. Normal MRI of the brain for age. 2. Patent circle of Willis. No large vessel occlusion, aneurysm, or significant stenosis is identified. 3. Patent carotid and vertebral arteries of the neck. No dissection, aneurysm, or  significant stenosis is identified.  MRI cervical spine 01/08/2012:  personally reviewed images and agree with the following: 1. Tiny left paracentral disc protrusion at C5-6 without significant  canal or neural foraminal stenosis. Finding is stable relative to  previous MRI from 05/28/2007.  2. Mild degenerative disc disease at C4-5 without canal or foraminal  stenosis.  3. Otherwise unremarkable MRI of the cervical spine.   CBC/CMP: normal 04/12/2020  Amlodipine, tylenol, aspirin, flexeril, gabapentin, ibuprofen, toradol injection, magnesium, losartan, meclizine, mobic, reglan, zofran, tramadol, topiramate   REVIEW OF SYSTEMS: Out of a complete 14 system review of symptoms, the patient complains only of the following symptoms, headaches, numbness and tingling of extremities, brain fog, mass of right posterior thigh and all other reviewed systems are negative.   ALLERGIES: Allergies  Allergen Reactions  . Iodinated Diagnostic Agents Anaphylaxis and Hives  . Other Shortness Of Breath    Lemon grass  . Wasp Venom Anaphylaxis and Shortness Of Breath  . Pollen Extract Swelling  . Adhesive [Tape] Rash  . Hydromorphone Hcl Nausea And Vomiting  . Omnipaque [Iohexol] Hives and Nausea Only    Pt. Was premedicated with emergent protocol.     HOME MEDICATIONS: Outpatient Medications Prior to Visit  Medication Sig Dispense Refill  . acetaminophen (  TYLENOL) 500 MG tablet Take 2 tablets (1,000 mg total) by mouth every 8 (eight) hours. 30 tablet 0  . acetaZOLAMIDE (DIAMOX) 500 MG capsule TAKE 1 CAPSULE BY MOUTH 2 TIMES DAILY (Patient taking differently: Take 500 mg by mouth 2 (two) times daily.) 180 capsule 3  . albuterol (PROVENTIL) (2.5 MG/3ML) 0.083% nebulizer solution Take 2.5 mg by nebulization every 6 (six) hours as needed for shortness of breath.    Marland Kitchen albuterol (VENTOLIN HFA) 108 (90 Base) MCG/ACT inhaler Inhale 1-2 puffs into the lungs every 6 (six) hours as needed for wheezing or  shortness of breath.    Marland Kitchen amitriptyline (ELAVIL) 25 MG tablet TAKE 1 TABLET BY MOUTH AT BEDTIME 30 tablet 3  . amLODipine (NORVASC) 5 MG tablet TAKE 1 TABLET BY MOUTH ONCE DAILY (Patient taking differently: Take 5 mg by mouth at bedtime.) 90 tablet 2  . B Complex Vitamins (VITAMIN B COMPLEX 100 IJ) Inject 1,000 mg as directed every 30 (thirty) days.    . ciprofloxacin (CIPRO) 500 MG tablet Take 500 mg by mouth 2 (two) times daily as needed (diverticulitis).    . Cyanocobalamin (VITAMIN B-12 IJ) Inject as directed every 30 (thirty) days.    . cyclobenzaprine (FLEXERIL) 10 MG tablet TAKE 1 TABLET BY MOUTH 3 TIMES DAILY (Patient taking differently: Take by mouth 3 (three) times daily as needed.) 30 tablet 1  . cyclobenzaprine (FLEXERIL) 5 MG tablet Take 1 tablet by mouth every 8 hours as needed for muscle spasm 40 tablet 0  . diphenhydrAMINE (BENADRYL) 25 MG tablet Take 25 mg by mouth every 6 (six) hours as needed for allergies.    Marland Kitchen docusate sodium (COLACE) 100 MG capsule Take 1 capsule (100 mg total) by mouth 2 (two) times daily. (Patient taking differently: Take 100 mg by mouth 2 (two) times daily as needed.) 10 capsule 0  . EPINEPHrine (EPIPEN) 0.3 mg/0.3 mL SOAJ injection Inject 0.3 mLs (0.3 mg total) into the muscle once. 1 Device 0  . famotidine (PEPCID) 40 MG tablet TAKE 1 TABLET BY MOUTH AT BEDTIME. (Patient taking differently: Take 40 mg by mouth at bedtime.) 30 tablet 1  . gabapentin (NEURONTIN) 300 MG capsule Take 1 capsule (300 mg total) by mouth at bedtime. (Patient taking differently: Take 300 mg by mouth at bedtime as needed (pain).) 90 capsule 5  . Galcanezumab-gnlm (EMGALITY) 120 MG/ML SOAJ Inject 120 mg into the skin every 30 (thirty) days. 1 mL 11  . loratadine (CLARITIN) 10 MG tablet Take 10 mg by mouth at bedtime.    . Magnesium 500 MG CAPS Take 2,000 mg by mouth at bedtime.     . meclizine (ANTIVERT) 25 MG tablet Take 25 mg by mouth 3 (three) times daily as needed for dizziness.     . metroNIDAZOLE (FLAGYL) 500 MG tablet Take 500 mg by mouth 3 (three) times daily as needed.    . montelukast (SINGULAIR) 10 MG tablet TAKE 1 TABLET BY MOUTH EVERY EVENING 90 tablet 2  . Multiple Vitamin (MULTIVITAMIN) tablet Take 1 tablet by mouth daily.    . ondansetron (ZOFRAN) 4 MG tablet take 1 tablet by mouth every 8 hours as needed for nausea 30 tablet 0  . oxyCODONE (OXY IR/ROXICODONE) 5 MG immediate release tablet Take 1-2 tablets by mouth every 6 hours as needed for severe pain 42 tablet 0  . pantoprazole (PROTONIX) 40 MG tablet TAKE (1) TABLET BY MOUTH TWICE DAILY BEFORE A MEAL. 60 tablet 1  . pantoprazole (PROTONIX) 40  MG tablet TAKE 1 TABLET BY MOUTH 2 TIMES DAILY 90 tablet 1  . polyethylene glycol (MIRALAX / GLYCOLAX) 17 g packet Take 17 g by mouth 2 (two) times daily. (Patient taking differently: Take 17 g by mouth daily as needed for moderate constipation.) 28 packet 0  . rizatriptan (MAXALT-MLT) 10 MG disintegrating tablet Take 1 tablet (10 mg total) by mouth as needed for migraine. May repeat in 2 hours if needed 9 tablet 11  . Vitamin D-Vitamin K (K2 PLUS D3 PO) Take 2 capsules by mouth daily.    . methocarbamol (ROBAXIN) 500 MG tablet Take 1 tablet (500 mg total) by mouth every 6 (six) hours as needed for muscle spasms. 40 tablet 0  . celecoxib (CELEBREX) 100 MG capsule Take 1 capsule (100 mg total) by mouth 2 (two) times daily. 60 capsule 0  . losartan (COZAAR) 100 MG tablet TAKE 1 TABLET BY MOUTH ONCE DAILY (Patient taking differently: Take 100 mg by mouth at bedtime.) 90 tablet 1  . ondansetron (ZOFRAN) 4 MG tablet Take 4 mg by mouth every 8 (eight) hours as needed for nausea or vomiting.    Marland Kitchen oxyCODONE (OXY IR/ROXICODONE) 5 MG immediate release tablet Take 1-2 tablets (5-10 mg total) by mouth every 4 (four) hours as needed for severe pain. 56 tablet 0  . pantoprazole (PROTONIX) 40 MG tablet TAKE 1 TABLET BY MOUTH 2 TIMES DAILY (Patient taking differently: Take 40 mg by mouth  2 (two) times daily.) 90 tablet 1   No facility-administered medications prior to visit.     PAST MEDICAL HISTORY: Past Medical History:  Diagnosis Date  . Anxiety   . Arthritis   . Asthma   . Cervical hyperplasia    hisotry of  . Chest pain    cath 2005 norm cors, repeat cath Feb 2011 normal cors and only minimally  elevated pulmonary pressures  . Crohn disease ([place])   . Diverticulitis of colon 07/17/2014  . Diverticulosis   . Dyspnea   . GERD (gastroesophageal reflux disease)   . Hiatal hernia   . History of iron deficiency anemia   . Hypertension   . Idiopathic intracranial hypertension    Per patient  . Migraines   . Obesity   . Palpitations   . Pneumonia 2008   14 days hospitalization, bilateral  . PONV (postoperative nausea and vomiting)   . Sleep apnea sleep study 11/03/2009    cpap; does sometimes, doesnt use every night. weight loss no longer using CPAP  . Vitamin B deficiency      PAST SURGICAL HISTORY: Past Surgical History:  Procedure Laterality Date  . ABDOMINAL HYSTERECTOMY    . ANKLE ARTHROSCOPY  06/01/2012   Procedure: ANKLE ARTHROSCOPY;  Surgeon: Colin Rhein, MD;  Location: [facility];  Service: Orthopedics;  Laterality: Left;  left ankle arthorsocpy with extensive debridement and gastroc slide  . ANKLE SURGERY Left 05/11/2018  . BIOPSY  04/26/2019   Procedure: BIOPSY;  Surgeon: Rogene Houston, MD;  [facility];  Service: Endoscopy;;  ileum colon  . CARDIAC CATHETERIZATION  11/22/2009 and 2005   WNL  . CESAREAN SECTION    . CHOLECYSTECTOMY  09/08/2006   lap. chole.  . CHONDROPLASTY  06/17/2012   Procedure: CHONDROPLASTY;  Surgeon: Carole Civil, MD;  Location: [facility];  Service: Orthopedics;  Laterality: Right;  right patella  . COLONOSCOPY N/A 04/26/2019   Procedure: COLONOSCOPY;  Surgeon: Rogene Houston, MD;  [facility];  Service: Endoscopy;  Laterality: N/A;  100  . ESOPHAGOGASTRODUODENOSCOPY N/A  05/14/2015   Procedure: ESOPHAGOGASTRODUODENOSCOPY (EGD);  Surgeon: Rogene Houston, MD;  [facility];  Service: Endoscopy;  Laterality: N/A;  730  . GIVENS CAPSULE STUDY N/A 05/24/2019   Procedure: GIVENS CAPSULE STUDY;  Surgeon: Rogene Houston, MD;  [facility];  Service: Endoscopy;  Laterality: N/A;  7:30  . INGUINAL HERNIA REPAIR  10/30/2008   right  . KNEE ARTHROSCOPY Right 2013  . KNEE ARTHROSCOPY WITH LATERAL MENISECTOMY Left 12/23/2016   Procedure: LEFT KNEE ARTHROSCOPY WITH LATERAL MENISECTOMY;  Surgeon: Carole Civil, MD;  Location: [facility];  Service: Orthopedics;  Laterality: Left;  . KNEE ARTHROSCOPY WITH MEDIAL MENISECTOMY Left 12/23/2016   Procedure: LEFT KNEE ARTHROSCOPY WITH MEDIAL MENISECTOMY CHONDROPLASTY PATELLA  AND MEDIAL FEMORAL CONDYLE LEFT KNEE;  Surgeon: Carole Civil, MD;  Location: [facility];  Service: Orthopedics;  Laterality: Left;  . SHOULDER ARTHROSCOPY WITH SUBACROMIAL DECOMPRESSION AND BICEP TENDON REPAIR Left 12/21/2017   Procedure: Left shoulder arthroscopic biceps tenodesis, SAD, DCR and labrum debridement;  Surgeon: Nicholes Stairs, MD;  Location: [facility];  Service: Orthopedics;  Laterality: Left;  120 mins  . SHOULDER SURGERY     right x 2   . TOTAL KNEE ARTHROPLASTY Left 06/06/2019   Procedure: TOTAL KNEE ARTHROPLASTY;  Surgeon: Paralee Cancel, MD;  Location: [facility];  Service: Orthopedics;  Laterality: Left;  70 mins  . TOTAL KNEE ARTHROPLASTY Right 01/23/2021   Procedure: TOTAL KNEE ARTHROPLASTY;  Surgeon: Paralee Cancel, MD;  Location: [facility];  Service: Orthopedics;  Laterality: Right;  70 mins     FAMILY HISTORY: Family History  Problem Relation Age of Onset  . Hypertension Mother   . Atrial fibrillation Mother   . Arthritis Mother   . Lupus Father   . COPD Father   . Rheum arthritis Father   . Sarcoidosis Father   . Lupus Sister   . Congestive Heart Failure Maternal Grandmother   . Migraines Neg Hx   . Headache Neg  Hx      SOCIAL HISTORY: Social History   Socioeconomic History  . Marital status: Married    Spouse name: Not on file  . Number of children: 4  . Years of education: Not on file  . Highest education level: Not on file  Occupational History    Employer: [employer]    Comment: corporate safety  Tobacco Use  . Smoking status: Never Smoker  . Smokeless tobacco: Never Used  Vaping Use  . Vaping Use: Never used  Substance and Sexual Activity  . Alcohol use: No    Alcohol/week: 0.0 standard drinks  . Drug use: Never  . Sexual activity: Yes    Birth control/protection: Surgical  Other Topics Concern  . Not on file  Social History Narrative   Lives with husband and oldest son    Caffeine use: about 3 cups of coffee/day   She works in Chief Operating Officer with [employer]   She has 4 children age 44-22.     Social Determinants of Health   Financial Resource Strain: Not on file  Food Insecurity: Not on file  Transportation Needs: Not on file  Physical Activity: Not on file  Stress: Not on file  Social Connections: Not on file  Intimate Partner Violence: Not on file      PHYSICAL EXAM  Vitals:   03/11/21 0825  BP: 122/88  Pulse: 83  Weight: 247 lb 12.8 oz (112.4 kg)  Height: 5' 11"  (1.803 m)   Body mass index is 34.56 kg/m.   Generalized: Well developed, in no acute distress   Neurological examination  Mentation: Alert oriented to time, place, history taking. Follows all commands speech and language fluent Cranial nerve II-XII: Pupils were equal round reactive to light. Extraocular movements were full, visual field were full on confrontational test. Facial sensation and strength were normal. Uvula tongue midline. Head turning and shoulder shrug  were normal and symmetric. Motor: The motor testing reveals 5 over 5 strength of all 4 extremities. Good symmetric motor tone is noted throughout.  Sensory: Sensory testing is intact to soft touch on all 4 extremities. No  evidence of extinction is noted.  Gait and station: Gait is arthritic, stable with cane    DIAGNOSTIC DATA (LABS, IMAGING, TESTING) - I reviewed patient records, labs, notes, testing and imaging myself where available.  Lab Results  Component Value Date   WBC 8.3 01/24/2021   HGB 11.3 (L) 01/24/2021   HCT 35.3 (L) 01/24/2021   MCV 91.0 01/24/2021   PLT 327 01/24/2021      Component Value Date/Time   NA 138 01/24/2021 0314   NA 144 09/07/2016 0907   K 3.8 01/24/2021 0314   CL 111 01/24/2021 0314   CO2 21 (L) 01/24/2021 0314   GLUCOSE 139 (H) 01/24/2021 0314   BUN 16 01/24/2021 0314   BUN 13 09/07/2016 0907   CREATININE 0.84 01/24/2021 0314   CREATININE 0.79 08/01/2019 1530   CALCIUM 8.6 (L) 01/24/2021 0314   PROT 7.8 04/12/2020 1232   ALBUMIN 4.1 04/12/2020 1232   AST 15 04/12/2020 1232   ALT 15 04/12/2020 1232   ALKPHOS 53 04/12/2020 1232   BILITOT 1.0 04/12/2020 1232   GFRNONAA >60 01/24/2021 0314   GFRNONAA 87 08/01/2019 1530   GFRAA >60 04/12/2020 5701   GFRAA 100 08/01/2019 1530   Lab Results  Component Value Date   CHOL 165 04/26/2016   HDL 41 04/26/2016   LDLCALC 88 04/26/2016   TRIG 181 (H) 04/26/2016   CHOLHDL 4.0 04/26/2016   Lab Results  Component Value Date   HGBA1C 5.8 (H) 04/26/2016   Lab Results  Component Value Date   VITAMINB12 279 12/30/2009   Lab Results  Component Value Date   TSH 0.880 07/11/2020      ASSESSMENT AND PLAN  55 y.o. year old female  has a past medical history of Anxiety, Arthritis, Asthma, Cervical hyperplasia, Chest pain, Crohn disease ([place]), Diverticulitis of colon (07/17/2014), Diverticulosis, Dyspnea, GERD (gastroesophageal reflux disease), Hiatal hernia, History of iron deficiency anemia, Hypertension, Idiopathic intracranial hypertension, Migraines, Obesity, Palpitations, Pneumonia (2008), PONV (postoperative nausea and vomiting), Sleep apnea (sleep study 11/03/2009), and Vitamin B deficiency. here with   IIH  (idiopathic intracranial hypertension)  Chronic migraine without aura without status migrainosus, not intractable  [name] repots that headaches are well managed at this time. Although she continues to have a mild tension headache most days, she does not feel these are bothersome. We will continue Emgality, acetazolamide, amitriptyline and rizatriptan as advised. Gabapentin may be helpful for leg pain and headaches. She will discuss with ortho. Healthy lifestyle habits encouraged. Follow-up recommended in 1 year, sooner if needed. She verbalizes understanding and agreement with this plan.  Debbora Presto, MSN, FNP-C 03/11/2021, 8:30 AM  [facility] [address] [place], [place] [address] [phone]

## 2021-03-11 ENCOUNTER — Other Ambulatory Visit (HOSPITAL_COMMUNITY): Payer: Self-pay

## 2021-03-11 ENCOUNTER — Ambulatory Visit (HOSPITAL_COMMUNITY): Payer: 59

## 2021-03-11 ENCOUNTER — Other Ambulatory Visit: Payer: Self-pay

## 2021-03-11 ENCOUNTER — Ambulatory Visit: Payer: 59 | Admitting: Family Medicine

## 2021-03-11 ENCOUNTER — Encounter: Payer: Self-pay | Admitting: Family Medicine

## 2021-03-11 VITALS — BP 122/88 | HR 83 | Ht 71.0 in | Wt 247.8 lb

## 2021-03-11 DIAGNOSIS — G932 Benign intracranial hypertension: Secondary | ICD-10-CM

## 2021-03-11 DIAGNOSIS — G43709 Chronic migraine without aura, not intractable, without status migrainosus: Secondary | ICD-10-CM | POA: Diagnosis not present

## 2021-03-11 DIAGNOSIS — R262 Difficulty in walking, not elsewhere classified: Secondary | ICD-10-CM

## 2021-03-11 DIAGNOSIS — M6281 Muscle weakness (generalized): Secondary | ICD-10-CM

## 2021-03-11 DIAGNOSIS — M25561 Pain in right knee: Secondary | ICD-10-CM | POA: Diagnosis not present

## 2021-03-11 MED ORDER — AMITRIPTYLINE HCL 25 MG PO TABS
ORAL_TABLET | Freq: Every day | ORAL | 3 refills | Status: DC
  Filled 2021-03-11: qty 90, fill #0
  Filled 2021-03-23: qty 90, 90d supply, fill #0
  Filled 2021-06-23: qty 90, 90d supply, fill #1
  Filled 2021-10-09: qty 90, 90d supply, fill #2
  Filled 2022-01-15: qty 90, 90d supply, fill #3

## 2021-03-11 MED ORDER — ACETAZOLAMIDE ER 500 MG PO CP12
ORAL_CAPSULE | Freq: Two times a day (BID) | ORAL | 3 refills | Status: DC
  Filled 2021-03-11 – 2021-05-14 (×2): qty 180, 90d supply, fill #0

## 2021-03-11 MED ORDER — RIZATRIPTAN BENZOATE 10 MG PO TBDP
10.0000 mg | ORAL_TABLET | ORAL | 11 refills | Status: DC | PRN
  Filled 2021-03-11: qty 9, 28d supply, fill #0
  Filled 2021-11-13: qty 9, 28d supply, fill #1

## 2021-03-11 MED ORDER — GABAPENTIN 300 MG PO CAPS
300.0000 mg | ORAL_CAPSULE | Freq: Every day | ORAL | 5 refills | Status: DC
  Filled 2021-03-11: qty 90, 90d supply, fill #0
  Filled 2021-06-16: qty 90, 90d supply, fill #1
  Filled 2021-09-21: qty 90, 90d supply, fill #2
  Filled 2022-01-15: qty 90, 90d supply, fill #3

## 2021-03-11 NOTE — Therapy (Signed)
[facility] [address] [place], [place], [address] Phone: [phone]   Fax:  [phone]  Physical Therapy Treatment and Progress Note and Recertification  Patient Details  Name: [name] MRN: [id number] Date of Birth: 04-19-1966 Referring Provider (PT): Paralee Cancel, MD  Progress Note Reporting Period 01/27/21 to 03/11/21  See note below for Objective Data and Assessment of Progress/Goals.      Encounter Date: 03/11/2021   PT End of Session - 03/11/21 1720    Visit Number 16    Number of Visits 25    Date for PT Re-Evaluation 04/08/21    Authorization Type [employer] - no auth or VL but review at the 25th visit    Authorization - Visit Number 16    Authorization - Number of Visits 25    Progress Note Due on Visit 26    PT Start Time 9449    PT Stop Time 1655    PT Time Calculation (min) 51 min    Activity Tolerance Patient tolerated treatment well;Patient limited by pain;No increased pain    Behavior During Therapy WFL for tasks assessed/performed           Past Medical History:  Diagnosis Date  . Anxiety   . Arthritis   . Asthma   . Cervical hyperplasia    hisotry of  . Chest pain    cath 2005 norm cors, repeat cath Feb 2011 normal cors and only minimally  elevated pulmonary pressures  . Crohn disease ([place])   . Diverticulitis of colon 07/17/2014  . Diverticulosis   . Dyspnea   . GERD (gastroesophageal reflux disease)   . Hiatal hernia   . History of iron deficiency anemia   . Hypertension   . Idiopathic intracranial hypertension    Per patient  . Migraines   . Obesity   . Palpitations   . Pneumonia 2008   14 days hospitalization, bilateral  . PONV (postoperative nausea and vomiting)   . Sleep apnea sleep study 11/03/2009    cpap; does sometimes, doesnt use every night. weight loss no longer using CPAP  . Vitamin B deficiency     Past Surgical History:  Procedure Laterality Date  . ABDOMINAL  HYSTERECTOMY    . ANKLE ARTHROSCOPY  06/01/2012   Procedure: ANKLE ARTHROSCOPY;  Surgeon: Colin Rhein, MD;  Location: [facility];  Service: Orthopedics;  Laterality: Left;  left ankle arthorsocpy with extensive debridement and gastroc slide  . ANKLE SURGERY Left 05/11/2018  . BIOPSY  04/26/2019   Procedure: BIOPSY;  Surgeon: Rogene Houston, MD;  [facility];  Service: Endoscopy;;  ileum colon  . CARDIAC CATHETERIZATION  11/22/2009 and 2005   WNL  . CESAREAN SECTION    . CHOLECYSTECTOMY  09/08/2006   lap. chole.  . CHONDROPLASTY  06/17/2012   Procedure: CHONDROPLASTY;  Surgeon: Carole Civil, MD;  [facility];  Service: Orthopedics;  Laterality: Right;  right patella  . COLONOSCOPY N/A 04/26/2019   Procedure: COLONOSCOPY;  Surgeon: Rogene Houston, MD;  [facility];  Service: Endoscopy;  Laterality: N/A;  100  . ESOPHAGOGASTRODUODENOSCOPY N/A 05/14/2015   Procedure: ESOPHAGOGASTRODUODENOSCOPY (EGD);  Surgeon: Rogene Houston, MD;  [facility];  Service: Endoscopy;  Laterality: N/A;  730  . GIVENS CAPSULE STUDY N/A 05/24/2019   Procedure: GIVENS CAPSULE STUDY;  Surgeon: Rogene Houston, MD;  [facility];  Service: Endoscopy;  Laterality: N/A;  7:30  . INGUINAL HERNIA REPAIR  10/30/2008   right  . KNEE ARTHROSCOPY Right 2013  . KNEE ARTHROSCOPY WITH LATERAL MENISECTOMY Left 12/23/2016   Procedure: LEFT KNEE ARTHROSCOPY WITH LATERAL MENISECTOMY;  Surgeon: Carole Civil, MD;  [facility];  Service: Orthopedics;  Laterality: Left;  . KNEE ARTHROSCOPY WITH MEDIAL MENISECTOMY Left 12/23/2016   Procedure: LEFT KNEE ARTHROSCOPY WITH MEDIAL MENISECTOMY CHONDROPLASTY PATELLA  AND MEDIAL FEMORAL CONDYLE LEFT KNEE;  Surgeon: Carole Civil, MD;  [facility];  Service: Orthopedics;  Laterality: Left;  . SHOULDER ARTHROSCOPY WITH SUBACROMIAL DECOMPRESSION AND BICEP TENDON REPAIR Left 12/21/2017   Procedure: Left  shoulder arthroscopic biceps tenodesis, SAD, DCR and labrum debridement;  Surgeon: Nicholes Stairs, MD;  Location: [facility];  Service: Orthopedics;  Laterality: Left;  120 mins  . SHOULDER SURGERY     right x 2   . TOTAL KNEE ARTHROPLASTY Left 06/06/2019   Procedure: TOTAL KNEE ARTHROPLASTY;  Surgeon: Paralee Cancel, MD;  [facility];  Service: Orthopedics;  Laterality: Left;  70 mins  . TOTAL KNEE ARTHROPLASTY Right 01/23/2021   Procedure: TOTAL KNEE ARTHROPLASTY;  Surgeon: Paralee Cancel, MD;  [facility];  Service: Orthopedics;  Laterality: Right;  70 mins    There were no vitals filed for this visit.   Subjective Assessment - 03/11/21 1620    Subjective Reports increased right quad pain since last Thursday and feels like it has gotten progressively worse.  Notes increased pain with right knee extension and when lifting the RLE in/out of bed. Pt cannot recall any specific mechanism of injury    Pertinent History previous knee surgery    Patient Stated Goals to be able to walk without waler    Pain Onset 1 to 4 weeks ago              [facility] PT Assessment - 03/11/21 0001      Assessment   Medical Diagnosis R TKA (P)     Referring Provider (PT) Paralee Cancel, MD (P)     Onset Date/Surgical Date 01/23/21 (P)     Hand Dominance Right (P)       Observation/Other Assessments   Observations no palpable deformity along quad tendon and able to fully extend knee against gravity (P)                          [facility] Adult PT Treatment/Exercise - 03/11/21 0001      Knee/Hip Exercises: Aerobic   Recumbent Bike bike for ROM seat at 18 forward revolution      Knee/Hip Exercises: Seated   Other Seated Knee/Hip Exercises self-massage with massage gun along right quadriceps      Knee/Hip Exercises: Supine   Knee Extension AROM    Knee Extension Limitations 0    Knee Flexion AROM    Knee Flexion Limitations 110      Manual Therapy   Manual Therapy Joint mobilization     Manual therapy comments Manual complete separate than rest of tx    Joint Mobilization distraction/oscillation to right tib-fem followed by static prone quad stretch. Lumbar PIVM grade 2-4 to assess for AMI            Trigger Point Dry Needling - 03/11/21 0001    Consent Given? Yes   tx provided by Jerene Pitch, PT   Education Handout Provided Yes    Muscles Treated Lower Quadrant Quadriceps  Dry Needling Comments 6 needles inserted in right quad with intermit. twisting while needles left in situ    Quadriceps Response Palpable increased muscle length                  PT Short Term Goals - 02/19/21 1321      PT SHORT TERM GOAL #1   Title Patient will be independent in self management strategies to improve quality of life and functional outcomes.    Time 3    Period Weeks    Status Achieved    Target Date 02/17/21      PT SHORT TERM GOAL #2   Title Patient will report at least 50% improvement in overall symptoms and/or function to demonstrate improved functional mobility    Time 3    Period Weeks    Status Achieved    Target Date 02/17/21      PT SHORT TERM GOAL #3   Title Patient will be able to ambualte at least 226 feet with least restrictive assistive device in 2 minutes to demonstrate improved walking endurance.    Time 3    Period Weeks    Status Achieved    Target Date 02/17/21      PT SHORT TERM GOAL #4   Title Patient will achieve 0-100 degrees in right knee to demonstrate improved knee motion    Time 3    Period Weeks    Status Achieved    Target Date 02/17/21             PT Long Term Goals - 03/11/21 1723      PT LONG TERM GOAL #1   Title Patient will report at least 75% improvement in overall symptoms and/or function to demonstrate improved functional mobility    Time 6    Period Weeks    Status On-going      PT LONG TERM GOAL #2   Title Patient will improve on FOTO score to meet predicted outcomes to demonstrate improved  functional mobility.    Time 6    Period Weeks    Status On-going      PT LONG TERM GOAL #3   Title Patient will be able to ambualte at least 226 feet without assistive device in 2 minutes to demonstrate improved walking endurance.    Time 6    Period Weeks    Status Achieved      PT LONG TERM GOAL #4   Title Patient will achieve 0-110 degrees in right knee to demonstrate improved knee motion    Baseline 0-110    Time 6    Period Weeks    Status Achieved                 Plan - 03/11/21 1721    Clinical Impression Statement Patient has been progressing very well with POC details but has unfortunately experienced a recent set-back with new onset of anterior right thigh pain which she outlines along quadriceps tendon and experiences pain with active movements of RLE requiring hip flexion with knee extension and with isolated knee extension.  Pt unsure of specific mechanism of injury. Continued POC indicated to progress with LTG and facilitate normalized ambulation and RLE strength to enable ambulation with limitation or need for AD    Personal Factors and Comorbidities Comorbidity 1    Comorbidities hx of rigth knee surgery    Examination-Activity Limitations Bathing;Sleep;Bed Mobility;Squat;Stairs;Stand;Transfers;Locomotion Level;Bend;Sit    Examination-Participation Restrictions Cleaning;Community Activity;Driving;Meal Prep;Occupation  Stability/Clinical Decision Making Stable/Uncomplicated    Rehab Potential Good    PT Frequency 3x / week    PT Duration 6 weeks    PT Treatment/Interventions ADLs/Self Care Home Management;Aquatic Therapy;Cryotherapy;Electrical Stimulation;Moist Heat;Traction;Balance training;Therapeutic exercise;Therapeutic activities;Functional mobility training;Stair training;Gait training;DME Instruction;Neuromuscular re-education;Patient/family education;Manual techniques;Dry needling;Passive range of motion;Joint Manipulations    PT Next Visit Plan  continue to work on squats, box lift, balance; increasing Rt knee flexion    PT Home Exercise Plan elevation, compression garments, heel slides 5/4 LAQ, SLR, heel slides; 5/11 knee flexion, prone quad stretch; 5/18 STS with equal weight on right LE    Consulted and Agree with Plan of Care Patient           Patient will benefit from skilled therapeutic intervention in order to improve the following deficits and impairments:  Abnormal gait,Decreased endurance,Decreased skin integrity,Pain,Increased edema,Decreased scar mobility,Decreased knowledge of precautions,Decreased balance,Decreased mobility,Difficulty walking,Decreased range of motion,Decreased strength,Decreased activity tolerance  Visit Diagnosis: Difficulty in walking, not elsewhere classified  Muscle weakness (generalized)  Acute pain of right knee     Problem List Patient Active Problem List   Diagnosis Date Noted  . S/P total knee arthroplasty, right 01/23/2021  . IIH (idiopathic intracranial hypertension) 10/14/2020  . Discoloration of skin of finger 09/12/2020  . Bilateral hand pain 09/12/2020  . Paresthesia of skin 09/12/2020  . Chronic migraine without aura without status migrainosus, not intractable 06/22/2020  . Iron deficiency anemia 06/20/2019  . S/P left TKA 06/06/2019  . Status post total left knee replacement 06/06/2019  . Special screening for malignant neoplasms, colon 03/28/2019  . LLQ abdominal pain 09/26/2018  . Derangement of anterior horn of lateral meniscus of left knee   . Derangement of posterior horn of medial meniscus of left knee   . Chest pain 04/25/2016  . Hypokalemia 04/25/2016  . Hyperglycemia 04/25/2016  . Diverticulitis of colon 07/17/2014  . Nausea without vomiting 07/17/2014  . Crohn disease ([place]) 07/17/2014  . History of arthroscopy of right knee 07/25/2012  . Difficulty in walking(719.7) 06/29/2012  . Weakness of right leg 06/29/2012  . Posterior tibial tendonitis 11/11/2011   . PTTD (posterior tibial tendon dysfunction) 11/11/2011  . Knee pain, right 11/11/2011  . Chest pain 07/16/2011  . Primary osteoarthritis of left knee 02/25/2010  . PAIN IN JOINT, MULTIPLE SITES 02/25/2010  . Essential hypertension 08/13/2009  . HIATAL HERNIA 08/13/2009  . PALPITATIONS 08/13/2009  . DYSPNEA 08/13/2009   5:28 PM, 03/11/21 M. Sherlyn Lees, PT, DPT Physical Therapist- [place] Office Number: [phone]  [facility] [address] [place], [place], [address] Phone: [phone]   Fax:  [phone]  Name: [name] MRN: [id number] Date of Birth: [birth year]

## 2021-03-13 ENCOUNTER — Other Ambulatory Visit (HOSPITAL_COMMUNITY): Payer: Self-pay

## 2021-03-13 DIAGNOSIS — Z471 Aftercare following joint replacement surgery: Secondary | ICD-10-CM | POA: Diagnosis not present

## 2021-03-13 DIAGNOSIS — Z96651 Presence of right artificial knee joint: Secondary | ICD-10-CM | POA: Diagnosis not present

## 2021-03-13 MED ORDER — PREDNISONE 5 MG PO TABS
ORAL_TABLET | ORAL | 0 refills | Status: DC
  Filled 2021-03-13: qty 48, 12d supply, fill #0

## 2021-03-13 MED ORDER — CELECOXIB 200 MG PO CAPS
ORAL_CAPSULE | Freq: Every day | ORAL | 0 refills | Status: DC
  Filled 2021-03-13: qty 30, 30d supply, fill #0

## 2021-03-14 ENCOUNTER — Other Ambulatory Visit (HOSPITAL_COMMUNITY): Payer: Self-pay

## 2021-03-18 ENCOUNTER — Other Ambulatory Visit: Payer: Self-pay

## 2021-03-18 ENCOUNTER — Encounter (HOSPITAL_COMMUNITY): Payer: Self-pay | Admitting: Physical Therapy

## 2021-03-18 ENCOUNTER — Ambulatory Visit (HOSPITAL_COMMUNITY): Payer: 59 | Admitting: Physical Therapy

## 2021-03-18 DIAGNOSIS — M25561 Pain in right knee: Secondary | ICD-10-CM | POA: Diagnosis not present

## 2021-03-18 DIAGNOSIS — M6281 Muscle weakness (generalized): Secondary | ICD-10-CM | POA: Diagnosis not present

## 2021-03-18 DIAGNOSIS — R262 Difficulty in walking, not elsewhere classified: Secondary | ICD-10-CM

## 2021-03-18 NOTE — Therapy (Signed)
[name], [place], [address] Phone: [phone]   Fax:  [phone]  Physical Therapy Treatment  Patient Details  Name: [name] MRN: [id number] Date of Birth: 11-29-1965 Referring Provider (PT): Paralee Cancel, MD   Encounter Date: 03/18/2021   PT End of Session - 03/18/21 1324     Visit Number 17    Number of Visits 25    Date for PT Re-Evaluation 04/08/21    Authorization Type [employer] - no auth or VL but review at the 25th visit    Authorization - Visit Number 17    Authorization - Number of Visits 25    Progress Note Due on Visit 26    PT Start Time 7408   arrives late/delayed check in   PT Stop Time 1357    PT Time Calculation (min) 34 min    Activity Tolerance Patient tolerated treatment well;Patient limited by pain;No increased pain    Behavior During Therapy [facility] for tasks assessed/performed             Past Medical History:  Diagnosis Date   Anxiety    Arthritis    Asthma    Cervical hyperplasia    hisotry of   Chest pain    cath 2005 norm cors, repeat cath Feb 2011 normal cors and only minimally  elevated pulmonary pressures   Crohn disease ([place])    Diverticulitis of colon 07/17/2014   Diverticulosis    Dyspnea    GERD (gastroesophageal reflux disease)    Hiatal hernia    History of iron deficiency anemia    Hypertension    Idiopathic intracranial hypertension    Per patient   Migraines    Obesity    Palpitations    Pneumonia 2008   14 days hospitalization, bilateral   PONV (postoperative nausea and vomiting)    Sleep apnea sleep study 11/03/2009    cpap; does sometimes, doesnt use every night. weight loss no longer using CPAP   Vitamin B deficiency     Past Surgical History:  Procedure Laterality Date   ABDOMINAL HYSTERECTOMY     ANKLE ARTHROSCOPY  06/01/2012   Procedure: ANKLE ARTHROSCOPY;  Surgeon: Colin Rhein, MD;  Location: [facility];   Service: Orthopedics;  Laterality: Left;  left ankle arthorsocpy with extensive debridement and gastroc slide   ANKLE SURGERY Left 05/11/2018   BIOPSY  04/26/2019   Procedure: BIOPSY;  Surgeon: Rogene Houston, MD;  [facility];  Service: Endoscopy;;  ileum colon   CARDIAC CATHETERIZATION  11/22/2009 and 2005   WNL   CESAREAN SECTION     CHOLECYSTECTOMY  09/08/2006   lap. chole.   CHONDROPLASTY  06/17/2012   Procedure: CHONDROPLASTY;  Surgeon: Carole Civil, MD;  [facility];  Service: Orthopedics;  Laterality: Right;  right patella   COLONOSCOPY N/A 04/26/2019   Procedure: COLONOSCOPY;  Surgeon: Rogene Houston, MD;  [facility];  Service: Endoscopy;  Laterality: N/A;  100   ESOPHAGOGASTRODUODENOSCOPY N/A 05/14/2015   Procedure: ESOPHAGOGASTRODUODENOSCOPY (EGD);  Surgeon: Rogene Houston, MD;  [facility];  Service: Endoscopy;  Laterality: N/A;  [place] N/A 05/24/2019   Procedure: GIVENS CAPSULE STUDY;  Surgeon: Rogene Houston, MD;  [facility];  Service: Endoscopy;  Laterality: N/A;  7:30   INGUINAL HERNIA REPAIR  10/30/2008   right   KNEE ARTHROSCOPY Right  2013   KNEE ARTHROSCOPY WITH LATERAL MENISECTOMY Left 12/23/2016   Procedure: LEFT KNEE ARTHROSCOPY WITH LATERAL MENISECTOMY;  Surgeon: Carole Civil, MD;  [facility];  Service: Orthopedics;  Laterality: Left;   KNEE ARTHROSCOPY WITH MEDIAL MENISECTOMY Left 12/23/2016   Procedure: LEFT KNEE ARTHROSCOPY WITH MEDIAL MENISECTOMY CHONDROPLASTY PATELLA  AND MEDIAL FEMORAL CONDYLE LEFT KNEE;  Surgeon: Carole Civil, MD;  [facility];  Service: Orthopedics;  Laterality: Left;   SHOULDER ARTHROSCOPY WITH SUBACROMIAL DECOMPRESSION AND BICEP TENDON REPAIR Left 12/21/2017   Procedure: Left shoulder arthroscopic biceps tenodesis, SAD, DCR and labrum debridement;  Surgeon: Nicholes Stairs, MD;  Location: [facility];  Service: Orthopedics;  Laterality: Left;   120 mins   SHOULDER SURGERY     right x 2    TOTAL KNEE ARTHROPLASTY Left 06/06/2019   Procedure: TOTAL KNEE ARTHROPLASTY;  Surgeon: Paralee Cancel, MD;  [facility];  Service: Orthopedics;  Laterality: Left;  70 mins   TOTAL KNEE ARTHROPLASTY Right 01/23/2021   Procedure: TOTAL KNEE ARTHROPLASTY;  Surgeon: Paralee Cancel, MD;  [facility];  Service: Orthopedics;  Laterality: Right;  70 mins    There were no vitals filed for this visit.   Subjective Assessment - 03/18/21 1325     Subjective Patient states continued high pain at night and she had trouble sleeping last night. Her knee continues to burn and sting. She thought she was going to be able to go back to work soon but its likely she will stay out longer.    Pertinent History previous knee surgery    Patient Stated Goals to be able to walk without waler    Currently in Pain? Yes    Pain Score 4     Pain Location Knee    Pain Orientation Right    Pain Descriptors / Indicators Aching    Pain Onset 1 to 4 weeks ago                               [facility] Adult PT Treatment/Exercise - 03/18/21 0001       Knee/Hip Exercises: Stretches   Sports administrator Right;5 reps;20 seconds    Quad Stretch Limitations prone      Knee/Hip Exercises: Aerobic   Recumbent Bike bike for ROM seat at 20-18 forward revolution - 5 minutes      Knee/Hip Exercises: Supine   Knee Extension AROM    Knee Extension Limitations 0    Knee Flexion AROM    Knee Flexion Limitations 100   improves to 105 following prone quad stretch, improves to 110 following contract relax     Manual Therapy   Manual Therapy Muscle Energy Technique    Manual therapy comments Manual complete separate than rest of tx    Muscle Energy Technique contract relax for flexion in seated                    PT Education - 03/18/21 1325     Education Details HEP, typical TKA presentation    Person(s) Educated Patient    Methods  Explanation;Demonstration    Comprehension Verbalized understanding;Returned demonstration              PT Short Term Goals - 02/19/21 1321       PT SHORT TERM GOAL #1   Title Patient will be independent in self management strategies to improve quality of life and functional  outcomes.    Time 3    Period Weeks    Status Achieved    Target Date 02/17/21      PT SHORT TERM GOAL #2   Title Patient will report at least 50% improvement in overall symptoms and/or function to demonstrate improved functional mobility    Time 3    Period Weeks    Status Achieved    Target Date 02/17/21      PT SHORT TERM GOAL #3   Title Patient will be able to ambualte at least 226 feet with least restrictive assistive device in 2 minutes to demonstrate improved walking endurance.    Time 3    Period Weeks    Status Achieved    Target Date 02/17/21      PT SHORT TERM GOAL #4   Title Patient will achieve 0-100 degrees in right knee to demonstrate improved knee motion    Time 3    Period Weeks    Status Achieved    Target Date 02/17/21               PT Long Term Goals - 03/11/21 1723       PT LONG TERM GOAL #1   Title Patient will report at least 75% improvement in overall symptoms and/or function to demonstrate improved functional mobility    Time 6    Period Weeks    Status On-going      PT LONG TERM GOAL #2   Title Patient will improve on FOTO score to meet predicted outcomes to demonstrate improved functional mobility.    Time 6    Period Weeks    Status On-going      PT LONG TERM GOAL #3   Title Patient will be able to ambualte at least 226 feet without assistive device in 2 minutes to demonstrate improved walking endurance.    Time 6    Period Weeks    Status Achieved      PT LONG TERM GOAL #4   Title Patient will achieve 0-110 degrees in right knee to demonstrate improved knee motion    Baseline 0-110    Time 6    Period Weeks    Status Achieved                    Plan - 03/18/21 1324     Clinical Impression Statement Patient educated on typical TKA presentation. Patient began session on bike for dynamic warm up and ROM. Patient showing decreased ROM today from 0-100 with continued c/o knee pain. ROM improves to 110 following prone quad stretch and contract relax. Patient will continue to benefit from skilled physical therapy in order to reduce impairment and improve function.    Personal Factors and Comorbidities Comorbidity 1    Comorbidities hx of rigth knee surgery    Examination-Activity Limitations Bathing;Sleep;Bed Mobility;Squat;Stairs;Stand;Transfers;Locomotion Level;Bend;Sit    Examination-Participation Restrictions Cleaning;Community Activity;Driving;Meal Prep;Occupation    Stability/Clinical Decision Making Stable/Uncomplicated    Rehab Potential Good    PT Frequency 3x / week    PT Duration 6 weeks    PT Treatment/Interventions ADLs/Self Care Home Management;Aquatic Therapy;Cryotherapy;Electrical Stimulation;Moist Heat;Traction;Balance training;Therapeutic exercise;Therapeutic activities;Functional mobility training;Stair training;Gait training;DME Instruction;Neuromuscular re-education;Patient/family education;Manual techniques;Dry needling;Passive range of motion;Joint Manipulations    PT Next Visit Plan continue to work on squats, box lift, balance; increasing Rt knee flexion    PT Home Exercise Plan elevation, compression garments, heel slides 5/4 LAQ, SLR, heel slides; 5/11 knee flexion, prone quad  stretch; 5/18 STS with equal weight on right LE 6/14 prone quad stretch    Consulted and Agree with Plan of Care Patient             Patient will benefit from skilled therapeutic intervention in order to improve the following deficits and impairments:  Abnormal gait, Decreased endurance, Decreased skin integrity, Pain, Increased edema, Decreased scar mobility, Decreased knowledge of precautions, Decreased balance, Decreased  mobility, Difficulty walking, Decreased range of motion, Decreased strength, Decreased activity tolerance  Visit Diagnosis: Difficulty in walking, not elsewhere classified  Muscle weakness (generalized)  Acute pain of right knee     Problem List Patient Active Problem List   Diagnosis Date Noted   S/P total knee arthroplasty, right 01/23/2021   IIH (idiopathic intracranial hypertension) 10/14/2020   Discoloration of skin of finger 09/12/2020   Bilateral hand pain 09/12/2020   Paresthesia of skin 09/12/2020   Chronic migraine without aura without status migrainosus, not intractable 06/22/2020   Iron deficiency anemia 06/20/2019   S/P left TKA 06/06/2019   Status post total left knee replacement 06/06/2019   Special screening for malignant neoplasms, colon 03/28/2019   LLQ abdominal pain 09/26/2018   Derangement of anterior horn of lateral meniscus of left knee    Derangement of posterior horn of medial meniscus of left knee    Chest pain 04/25/2016   Hypokalemia 04/25/2016   Hyperglycemia 04/25/2016   Diverticulitis of colon 07/17/2014   Nausea without vomiting 07/17/2014   Crohn disease ([place]) 07/17/2014   History of arthroscopy of right knee 07/25/2012   Difficulty in walking(719.7) 06/29/2012   Weakness of right leg 06/29/2012   Posterior tibial tendonitis 11/11/2011   PTTD (posterior tibial tendon dysfunction) 11/11/2011   Knee pain, right 11/11/2011   Chest pain 07/16/2011   Primary osteoarthritis of left knee 02/25/2010   PAIN IN JOINT, MULTIPLE SITES 02/25/2010   Essential hypertension 08/13/2009   HIATAL HERNIA 08/13/2009   PALPITATIONS 08/13/2009   DYSPNEA 08/13/2009    2:01 PM, 03/18/21 Mearl Latin PT, DPT Physical Therapist at [facility] [place], [place], [address] Phone: [phone]   Fax:  [phone]  Name: PEARLY BARTOSIK MRN: [id number] Date  of Birth: 06-02-1966

## 2021-03-18 NOTE — Patient Instructions (Signed)
Access Code: [email or web address] URL: [email or web address] Date: 03/18/2021 Prepared by: [facility] Ciel Chervenak  Exercises Prone Quadriceps Stretch with Strap - 1 x daily - 7 x weekly - 5 reps - 20 second hold

## 2021-03-21 ENCOUNTER — Ambulatory Visit (HOSPITAL_COMMUNITY): Payer: 59

## 2021-03-21 ENCOUNTER — Other Ambulatory Visit: Payer: Self-pay

## 2021-03-21 ENCOUNTER — Encounter (HOSPITAL_COMMUNITY): Payer: Self-pay

## 2021-03-21 ENCOUNTER — Other Ambulatory Visit (HOSPITAL_COMMUNITY): Payer: Self-pay

## 2021-03-21 DIAGNOSIS — D511 Vitamin B12 deficiency anemia due to selective vitamin B12 malabsorption with proteinuria: Secondary | ICD-10-CM | POA: Diagnosis not present

## 2021-03-21 DIAGNOSIS — R262 Difficulty in walking, not elsewhere classified: Secondary | ICD-10-CM

## 2021-03-21 DIAGNOSIS — I10 Essential (primary) hypertension: Secondary | ICD-10-CM | POA: Diagnosis not present

## 2021-03-21 DIAGNOSIS — Z0001 Encounter for general adult medical examination with abnormal findings: Secondary | ICD-10-CM | POA: Diagnosis not present

## 2021-03-21 DIAGNOSIS — J45909 Unspecified asthma, uncomplicated: Secondary | ICD-10-CM | POA: Diagnosis not present

## 2021-03-21 DIAGNOSIS — M6281 Muscle weakness (generalized): Secondary | ICD-10-CM | POA: Diagnosis not present

## 2021-03-21 DIAGNOSIS — E6609 Other obesity due to excess calories: Secondary | ICD-10-CM | POA: Diagnosis not present

## 2021-03-21 DIAGNOSIS — Z6834 Body mass index (BMI) 34.0-34.9, adult: Secondary | ICD-10-CM | POA: Diagnosis not present

## 2021-03-21 DIAGNOSIS — K5090 Crohn's disease, unspecified, without complications: Secondary | ICD-10-CM | POA: Diagnosis not present

## 2021-03-21 DIAGNOSIS — M25561 Pain in right knee: Secondary | ICD-10-CM

## 2021-03-21 MED ORDER — ALBUTEROL SULFATE HFA 108 (90 BASE) MCG/ACT IN AERS
INHALATION_SPRAY | RESPIRATORY_TRACT | 2 refills | Status: DC
  Filled 2021-03-21: qty 18, 16d supply, fill #0
  Filled 2022-03-10: qty 18, 16d supply, fill #1

## 2021-03-21 MED ORDER — ALBUTEROL SULFATE (2.5 MG/3ML) 0.083% IN NEBU
INHALATION_SOLUTION | RESPIRATORY_TRACT | 1 refills | Status: DC
  Filled 2021-03-21: qty 90, 7d supply, fill #0

## 2021-03-21 NOTE — Therapy (Signed)
[place], [place], [address] Phone: [phone]   Fax:  [phone]  Physical Therapy Treatment  Patient Details  Name: [name] MRN: [id number] Date of Birth: April 24, 1966 Referring Provider (PT): Paralee Cancel, MD   Encounter Date: 03/21/2021   PT End of Session - 03/21/21 0835     Visit Number 18    Number of Visits 25    Date for PT Re-Evaluation 04/08/21    Authorization Type [employer] - no auth or VL but review at the 25th visit    Authorization - Visit Number 18    Authorization - Number of Visits 25    Progress Note Due on Visit 26    PT Start Time 0830   late arrival   PT Stop Time 0905    PT Time Calculation (min) 35 min    Activity Tolerance Patient tolerated treatment well;Patient limited by pain;No increased pain    Behavior During Therapy [facility] for tasks assessed/performed             Past Medical History:  Diagnosis Date   Anxiety    Arthritis    Asthma    Cervical hyperplasia    hisotry of   Chest pain    cath 2005 norm cors, repeat cath Feb 2011 normal cors and only minimally  elevated pulmonary pressures   Crohn disease ([place])    Diverticulitis of colon 07/17/2014   Diverticulosis    Dyspnea    GERD (gastroesophageal reflux disease)    Hiatal hernia    History of iron deficiency anemia    Hypertension    Idiopathic intracranial hypertension    Per patient   Migraines    Obesity    Palpitations    Pneumonia 2008   14 days hospitalization, bilateral   PONV (postoperative nausea and vomiting)    Sleep apnea sleep study 11/03/2009    cpap; does sometimes, doesnt use every night. weight loss no longer using CPAP   Vitamin B deficiency     Past Surgical History:  Procedure Laterality Date   ABDOMINAL HYSTERECTOMY     ANKLE ARTHROSCOPY  06/01/2012   Procedure: ANKLE ARTHROSCOPY;  Surgeon: Colin Rhein, MD;  Location: [place];  Service:  Orthopedics;  Laterality: Left;  left ankle arthorsocpy with extensive debridement and gastroc slide   ANKLE SURGERY Left 05/11/2018   BIOPSY  04/26/2019   Procedure: BIOPSY;  Surgeon: Rogene Houston, MD;  [facility];  Service: Endoscopy;;  ileum colon   CARDIAC CATHETERIZATION  11/22/2009 and 2005   WNL   CESAREAN SECTION     CHOLECYSTECTOMY  09/08/2006   lap. chole.   CHONDROPLASTY  06/17/2012   Procedure: CHONDROPLASTY;  Surgeon: Carole Civil, MD;  [facility];  Service: Orthopedics;  Laterality: Right;  right patella   COLONOSCOPY N/A 04/26/2019   Procedure: COLONOSCOPY;  Surgeon: Rogene Houston, MD;  [facility];  Service: Endoscopy;  Laterality: N/A;  100   ESOPHAGOGASTRODUODENOSCOPY N/A 05/14/2015   Procedure: ESOPHAGOGASTRODUODENOSCOPY (EGD);  Surgeon: Rogene Houston, MD;  [facility];  Service: Endoscopy;  Laterality: N/A;  [place] N/A 05/24/2019   Procedure: GIVENS CAPSULE STUDY;  Surgeon: Rogene Houston, MD;  [facility];  Service: Endoscopy;  Laterality: N/A;  7:30   INGUINAL HERNIA REPAIR  10/30/2008   right   KNEE ARTHROSCOPY Right 2013  KNEE ARTHROSCOPY WITH LATERAL MENISECTOMY Left 12/23/2016   Procedure: LEFT KNEE ARTHROSCOPY WITH LATERAL MENISECTOMY;  Surgeon: Carole Civil, MD;  [facility];  Service: Orthopedics;  Laterality: Left;   KNEE ARTHROSCOPY WITH MEDIAL MENISECTOMY Left 12/23/2016   Procedure: LEFT KNEE ARTHROSCOPY WITH MEDIAL MENISECTOMY CHONDROPLASTY PATELLA  AND MEDIAL FEMORAL CONDYLE LEFT KNEE;  Surgeon: Carole Civil, MD;  [facility];  Service: Orthopedics;  Laterality: Left;   SHOULDER ARTHROSCOPY WITH SUBACROMIAL DECOMPRESSION AND BICEP TENDON REPAIR Left 12/21/2017   Procedure: Left shoulder arthroscopic biceps tenodesis, SAD, DCR and labrum debridement;  Surgeon: Nicholes Stairs, MD;  Location: [place];  Service: Orthopedics;  Laterality: Left;  120 mins    SHOULDER SURGERY     right x 2    TOTAL KNEE ARTHROPLASTY Left 06/06/2019   Procedure: TOTAL KNEE ARTHROPLASTY;  Surgeon: Paralee Cancel, MD;  [facility];  Service: Orthopedics;  Laterality: Left;  70 mins   TOTAL KNEE ARTHROPLASTY Right 01/23/2021   Procedure: TOTAL KNEE ARTHROPLASTY;  Surgeon: Paralee Cancel, MD;  [facility];  Service: Orthopedics;  Laterality: Right;  70 mins    There were no vitals filed for this visit.   Subjective Assessment - 03/21/21 0833     Subjective Contihnued quad pain in right knee. Pt had MD appointment the other day and Ortho MD has started on steroid taper and Celebrex for inflammation    Pertinent History previous knee surgery    Patient Stated Goals to be able to walk without waler    Currently in Pain? Yes    Pain Score 4     Pain Location Knee    Pain Orientation Right    Pain Descriptors / Indicators Aching    Pain Type Surgical pain    Pain Onset 1 to 4 weeks ago                               [facility] Adult PT Treatment/Exercise - 03/21/21 0001       Knee/Hip Exercises: Aerobic   Recumbent Bike Bike x 10 min seat level 19 x 10 min at end of session to improve tolerance      Knee/Hip Exercises: Standing   Knee Flexion Strengthening;Right;3 sets;10 reps    Knee Flexion Limitations 5 lbs    Hip Flexion Stengthening;Right;3 sets;10 reps    Hip Flexion Limitations 5 lbs    Forward Lunges Right;20 reps;5 seconds    Forward Lunges Limitations lunge stretch 12" step    Terminal Knee Extension Strengthening;Right;2 sets;15 reps    Terminal Knee Extension Limitations 4 plates, 5 plates    Gait Training resisted retro/forward walking with 4 plates x 2 min                      PT Short Term Goals - 02/19/21 1321       PT SHORT TERM GOAL #1   Title Patient will be independent in self management strategies to improve quality of life and functional outcomes.    Time 3    Period Weeks    Status Achieved     Target Date 02/17/21      PT SHORT TERM GOAL #2   Title Patient will report at least 50% improvement in overall symptoms and/or function to demonstrate improved functional mobility    Time 3    Period Weeks    Status Achieved  Target Date 02/17/21      PT SHORT TERM GOAL #3   Title Patient will be able to ambualte at least 226 feet with least restrictive assistive device in 2 minutes to demonstrate improved walking endurance.    Time 3    Period Weeks    Status Achieved    Target Date 02/17/21      PT SHORT TERM GOAL #4   Title Patient will achieve 0-100 degrees in right knee to demonstrate improved knee motion    Time 3    Period Weeks    Status Achieved    Target Date 02/17/21               PT Long Term Goals - 03/11/21 1723       PT LONG TERM GOAL #1   Title Patient will report at least 75% improvement in overall symptoms and/or function to demonstrate improved functional mobility    Time 6    Period Weeks    Status On-going      PT LONG TERM GOAL #2   Title Patient will improve on FOTO score to meet predicted outcomes to demonstrate improved functional mobility.    Time 6    Period Weeks    Status On-going      PT LONG TERM GOAL #3   Title Patient will be able to ambualte at least 226 feet without assistive device in 2 minutes to demonstrate improved walking endurance.    Time 6    Period Weeks    Status Achieved      PT LONG TERM GOAL #4   Title Patient will achieve 0-110 degrees in right knee to demonstrate improved knee motion    Baseline 0-110    Time 6    Period Weeks    Status Achieved                   Plan - 03/21/21 0902     Clinical Impression Statement Tolerating tx sessions well albeit with decrease in resistance and repetition performance due to recent onset of right quad pain requiring more gradual progression due to soft tissue dysfunction. Continued PT sessions indicated to improve RLE strength and ROM to progress to  normalized ambulation    Personal Factors and Comorbidities Comorbidity 1    Comorbidities hx of rigth knee surgery    Examination-Activity Limitations Bathing;Sleep;Bed Mobility;Squat;Stairs;Stand;Transfers;Locomotion Level;Bend;Sit    Examination-Participation Restrictions Cleaning;Community Activity;Driving;Meal Prep;Occupation    Stability/Clinical Decision Making Stable/Uncomplicated    Rehab Potential Good    PT Frequency 3x / week    PT Duration 6 weeks    PT Treatment/Interventions ADLs/Self Care Home Management;Aquatic Therapy;Cryotherapy;Electrical Stimulation;Moist Heat;Traction;Balance training;Therapeutic exercise;Therapeutic activities;Functional mobility training;Stair training;Gait training;DME Instruction;Neuromuscular re-education;Patient/family education;Manual techniques;Dry needling;Passive range of motion;Joint Manipulations    PT Next Visit Plan continue to work on squats, box lift, balance; increasing Rt knee flexion    PT Home Exercise Plan elevation, compression garments, heel slides 5/4 LAQ, SLR, heel slides; 5/11 knee flexion, prone quad stretch; 5/18 STS with equal weight on right LE 6/14 prone quad stretch    Consulted and Agree with Plan of Care Patient             Patient will benefit from skilled therapeutic intervention in order to improve the following deficits and impairments:  Abnormal gait, Decreased endurance, Decreased skin integrity, Pain, Increased edema, Decreased scar mobility, Decreased knowledge of precautions, Decreased balance, Decreased mobility, Difficulty walking, Decreased range of motion, Decreased strength, Decreased  activity tolerance  Visit Diagnosis: Difficulty in walking, not elsewhere classified  Muscle weakness (generalized)  Acute pain of right knee     Problem List Patient Active Problem List   Diagnosis Date Noted   S/P total knee arthroplasty, right 01/23/2021   IIH (idiopathic intracranial hypertension) 10/14/2020    Discoloration of skin of finger 09/12/2020   Bilateral hand pain 09/12/2020   Paresthesia of skin 09/12/2020   Chronic migraine without aura without status migrainosus, not intractable 06/22/2020   Iron deficiency anemia 06/20/2019   S/P left TKA 06/06/2019   Status post total left knee replacement 06/06/2019   Special screening for malignant neoplasms, colon 03/28/2019   LLQ abdominal pain 09/26/2018   Derangement of anterior horn of lateral meniscus of left knee    Derangement of posterior horn of medial meniscus of left knee    Chest pain 04/25/2016   Hypokalemia 04/25/2016   Hyperglycemia 04/25/2016   Diverticulitis of colon 07/17/2014   Nausea without vomiting 07/17/2014   Crohn disease ([place]) 07/17/2014   History of arthroscopy of right knee 07/25/2012   Difficulty in walking(719.7) 06/29/2012   Weakness of right leg 06/29/2012   Posterior tibial tendonitis 11/11/2011   PTTD (posterior tibial tendon dysfunction) 11/11/2011   Knee pain, right 11/11/2011   Chest pain 07/16/2011   Primary osteoarthritis of left knee 02/25/2010   PAIN IN JOINT, MULTIPLE SITES 02/25/2010   Essential hypertension 08/13/2009   HIATAL HERNIA 08/13/2009   PALPITATIONS 08/13/2009   DYSPNEA 08/13/2009   9:09 AM, 03/21/21 M. Sherlyn Lees, PT, DPT Physical Therapist- [place] Office Number: [phone]   [facility] [address] [place], [place], [address] Phone: [phone]   Fax:  [phone]  Name: ELEANA TOCCO MRN: [id number] Date of Birth: [birth year]

## 2021-03-24 ENCOUNTER — Other Ambulatory Visit (HOSPITAL_COMMUNITY): Payer: Self-pay

## 2021-03-25 ENCOUNTER — Ambulatory Visit (HOSPITAL_COMMUNITY): Payer: 59

## 2021-03-25 ENCOUNTER — Other Ambulatory Visit: Payer: Self-pay

## 2021-03-25 DIAGNOSIS — R262 Difficulty in walking, not elsewhere classified: Secondary | ICD-10-CM

## 2021-03-25 DIAGNOSIS — M25561 Pain in right knee: Secondary | ICD-10-CM

## 2021-03-25 DIAGNOSIS — M6281 Muscle weakness (generalized): Secondary | ICD-10-CM

## 2021-03-25 NOTE — Therapy (Signed)
[facility], [place], [address] Phone: [phone]   Fax:  [phone]  Physical Therapy Treatment  Patient Details  Name: [name] MRN: [id number] Date of Birth: Apr 09, 1966 Referring Provider (PT): Paralee Cancel, MD   Encounter Date: 03/25/2021   PT End of Session - 03/25/21 1018     Visit Number 19    Number of Visits 25    Date for PT Re-Evaluation 04/08/21    Authorization Type [employer] - no auth or VL but review at the 25th visit    Authorization - Visit Number 19    Authorization - Number of Visits 25    Progress Note Due on Visit 26    PT Start Time 0945    PT Stop Time 1030    PT Time Calculation (min) 45 min    Activity Tolerance Patient tolerated treatment well;Patient limited by pain;No increased pain    Behavior During Therapy [facility] for tasks assessed/performed             Past Medical History:  Diagnosis Date   Anxiety    Arthritis    Asthma    Cervical hyperplasia    hisotry of   Chest pain    cath 2005 norm cors, repeat cath Feb 2011 normal cors and only minimally  elevated pulmonary pressures   Crohn disease ([place])    Diverticulitis of colon 07/17/2014   Diverticulosis    Dyspnea    GERD (gastroesophageal reflux disease)    Hiatal hernia    History of iron deficiency anemia    Hypertension    Idiopathic intracranial hypertension    Per patient   Migraines    Obesity    Palpitations    Pneumonia 2008   14 days hospitalization, bilateral   PONV (postoperative nausea and vomiting)    Sleep apnea sleep study 11/03/2009    cpap; does sometimes, doesnt use every night. weight loss no longer using CPAP   Vitamin B deficiency     Past Surgical History:  Procedure Laterality Date   ABDOMINAL HYSTERECTOMY     ANKLE ARTHROSCOPY  06/01/2012   Procedure: ANKLE ARTHROSCOPY;  Surgeon: Colin Rhein, MD;  Location: [place];  Service: Orthopedics;  Laterality:  Left;  left ankle arthorsocpy with extensive debridement and gastroc slide   ANKLE SURGERY Left 05/11/2018   BIOPSY  04/26/2019   Procedure: BIOPSY;  Surgeon: Rogene Houston, MD;  [facility];  Service: Endoscopy;;  ileum colon   CARDIAC CATHETERIZATION  11/22/2009 and 2005   WNL   CESAREAN SECTION     CHOLECYSTECTOMY  09/08/2006   lap. chole.   CHONDROPLASTY  06/17/2012   Procedure: CHONDROPLASTY;  Surgeon: Carole Civil, MD;  [facility];  Service: Orthopedics;  Laterality: Right;  right patella   COLONOSCOPY N/A 04/26/2019   Procedure: COLONOSCOPY;  Surgeon: Rogene Houston, MD;  [facility];  Service: Endoscopy;  Laterality: N/A;  100   ESOPHAGOGASTRODUODENOSCOPY N/A 05/14/2015   Procedure: ESOPHAGOGASTRODUODENOSCOPY (EGD);  Surgeon: Rogene Houston, MD;  [facility];  Service: Endoscopy;  Laterality: N/A;  [place] N/A 05/24/2019   Procedure: GIVENS CAPSULE STUDY;  Surgeon: Rogene Houston, MD;  [facility];  Service: Endoscopy;  Laterality: N/A;  7:30   INGUINAL HERNIA REPAIR  10/30/2008   right   KNEE ARTHROSCOPY Right 2013   KNEE ARTHROSCOPY  WITH LATERAL MENISECTOMY Left 12/23/2016   Procedure: LEFT KNEE ARTHROSCOPY WITH LATERAL MENISECTOMY;  Surgeon: Carole Civil, MD;  [facility];  Service: Orthopedics;  Laterality: Left;   KNEE ARTHROSCOPY WITH MEDIAL MENISECTOMY Left 12/23/2016   Procedure: LEFT KNEE ARTHROSCOPY WITH MEDIAL MENISECTOMY CHONDROPLASTY PATELLA  AND MEDIAL FEMORAL CONDYLE LEFT KNEE;  Surgeon: Carole Civil, MD;  [facility];  Service: Orthopedics;  Laterality: Left;   SHOULDER ARTHROSCOPY WITH SUBACROMIAL DECOMPRESSION AND BICEP TENDON REPAIR Left 12/21/2017   Procedure: Left shoulder arthroscopic biceps tenodesis, SAD, DCR and labrum debridement;  Surgeon: Nicholes Stairs, MD;  Location: [place];  Service: Orthopedics;  Laterality: Left;  120 mins   SHOULDER SURGERY      right x 2    TOTAL KNEE ARTHROPLASTY Left 06/06/2019   Procedure: TOTAL KNEE ARTHROPLASTY;  Surgeon: Paralee Cancel, MD;  [facility];  Service: Orthopedics;  Laterality: Left;  70 mins   TOTAL KNEE ARTHROPLASTY Right 01/23/2021   Procedure: TOTAL KNEE ARTHROPLASTY;  Surgeon: Paralee Cancel, MD;  [facility];  Service: Orthopedics;  Laterality: Right;  70 mins    There were no vitals filed for this visit.   Subjective Assessment - 03/25/21 1016     Subjective Continued anterior right knee painalong quad tendon and presents with increased pain and edema along anterior knee    Pertinent History previous knee surgery    Patient Stated Goals to be able to walk without waler    Pain Onset 1 to 4 weeks ago                [facility] PT Assessment - 03/25/21 0001       Assessment   Medical Diagnosis R TKA    Referring Provider (PT) Paralee Cancel, MD    Onset Date/Surgical Date 01/23/21    Hand Dominance Right    Next MD Visit 04/11/21                           [facility] Adult PT Treatment/Exercise - 03/25/21 0001       Knee/Hip Exercises: Stretches   Gastroc Stretch Both;2 reps;60 seconds    Gastroc Stretch Limitations slantboard      Knee/Hip Exercises: Aerobic   Nustep level 2 x 8 min for gentle ROM and dynamic warm-up      Knee/Hip Exercises: Machines for Strengthening   Total Gym Leg Press 34 degree 3x10      Manual Therapy   Manual Therapy Soft tissue mobilization;Taping    Manual therapy comments Manual complete separate than rest of tx    Soft tissue mobilization massage gun along right quad for pain relief and muscle facilitation    Kinesiotex Edema;Facilitate Muscle      Kinesiotix   Edema lifting tape along quad tendon,    Facilitate Muscle  quad facilitation                      PT Short Term Goals - 02/19/21 1321       PT SHORT TERM GOAL #1   Title Patient will be independent in self management strategies to improve quality of  life and functional outcomes.    Time 3    Period Weeks    Status Achieved    Target Date 02/17/21      PT SHORT TERM GOAL #2   Title Patient will report at least 50% improvement in overall symptoms and/or  function to demonstrate improved functional mobility    Time 3    Period Weeks    Status Achieved    Target Date 02/17/21      PT SHORT TERM GOAL #3   Title Patient will be able to ambualte at least 226 feet with least restrictive assistive device in 2 minutes to demonstrate improved walking endurance.    Time 3    Period Weeks    Status Achieved    Target Date 02/17/21      PT SHORT TERM GOAL #4   Title Patient will achieve 0-100 degrees in right knee to demonstrate improved knee motion    Time 3    Period Weeks    Status Achieved    Target Date 02/17/21               PT Long Term Goals - 03/11/21 1723       PT LONG TERM GOAL #1   Title Patient will report at least 75% improvement in overall symptoms and/or function to demonstrate improved functional mobility    Time 6    Period Weeks    Status On-going      PT LONG TERM GOAL #2   Title Patient will improve on FOTO score to meet predicted outcomes to demonstrate improved functional mobility.    Time 6    Period Weeks    Status On-going      PT LONG TERM GOAL #3   Title Patient will be able to ambualte at least 226 feet without assistive device in 2 minutes to demonstrate improved walking endurance.    Time 6    Period Weeks    Status Achieved      PT LONG TERM GOAL #4   Title Patient will achieve 0-110 degrees in right knee to demonstrate improved knee motion    Baseline 0-110    Time 6    Period Weeks    Status Achieved                   Plan - 03/25/21 1025     Clinical Impression Statement decrease in activity tolerance and RLE strength/ROM since recent reinjury/pain along anterior right quad tendon. Demo 95 degrees right knee flexion in sitting with increase in anterior thigh  discomfort with stretched position. COntinued tx indicated to progress rehab protocol to improve right knee function and decrease pain    Personal Factors and Comorbidities Comorbidity 1    Comorbidities hx of rigth knee surgery    Examination-Activity Limitations Bathing;Sleep;Bed Mobility;Squat;Stairs;Stand;Transfers;Locomotion Level;Bend;Sit    Examination-Participation Restrictions Cleaning;Community Activity;Driving;Meal Prep;Occupation    Stability/Clinical Decision Making Stable/Uncomplicated    Rehab Potential Good    PT Frequency 3x / week    PT Duration 6 weeks    PT Treatment/Interventions ADLs/Self Care Home Management;Aquatic Therapy;Cryotherapy;Electrical Stimulation;Moist Heat;Traction;Balance training;Therapeutic exercise;Therapeutic activities;Functional mobility training;Stair training;Gait training;DME Instruction;Neuromuscular re-education;Patient/family education;Manual techniques;Dry needling;Passive range of motion;Joint Manipulations    PT Next Visit Plan continue to work on squats, box lift, balance; increasing Rt knee flexion    PT Home Exercise Plan elevation, compression garments, heel slides 5/4 LAQ, SLR, heel slides; 5/11 knee flexion, prone quad stretch; 5/18 STS with equal weight on right LE 6/14 prone quad stretch    Consulted and Agree with Plan of Care Patient             Patient will benefit from skilled therapeutic intervention in order to improve the following deficits and impairments:  Abnormal gait, Decreased  endurance, Decreased skin integrity, Pain, Increased edema, Decreased scar mobility, Decreased knowledge of precautions, Decreased balance, Decreased mobility, Difficulty walking, Decreased range of motion, Decreased strength, Decreased activity tolerance  Visit Diagnosis: Difficulty in walking, not elsewhere classified  Muscle weakness (generalized)  Acute pain of right knee     Problem List Patient Active Problem List   Diagnosis Date  Noted   S/P total knee arthroplasty, right 01/23/2021   IIH (idiopathic intracranial hypertension) 10/14/2020   Discoloration of skin of finger 09/12/2020   Bilateral hand pain 09/12/2020   Paresthesia of skin 09/12/2020   Chronic migraine without aura without status migrainosus, not intractable 06/22/2020   Iron deficiency anemia 06/20/2019   S/P left TKA 06/06/2019   Status post total left knee replacement 06/06/2019   Special screening for malignant neoplasms, colon 03/28/2019   LLQ abdominal pain 09/26/2018   Derangement of anterior horn of lateral meniscus of left knee    Derangement of posterior horn of medial meniscus of left knee    Chest pain 04/25/2016   Hypokalemia 04/25/2016   Hyperglycemia 04/25/2016   Diverticulitis of colon 07/17/2014   Nausea without vomiting 07/17/2014   Crohn disease ([place]) 07/17/2014   History of arthroscopy of right knee 07/25/2012   Difficulty in walking(719.7) 06/29/2012   Weakness of right leg 06/29/2012   Posterior tibial tendonitis 11/11/2011   PTTD (posterior tibial tendon dysfunction) 11/11/2011   Knee pain, right 11/11/2011   Chest pain 07/16/2011   Primary osteoarthritis of left knee 02/25/2010   PAIN IN JOINT, MULTIPLE SITES 02/25/2010   Essential hypertension 08/13/2009   HIATAL HERNIA 08/13/2009   PALPITATIONS 08/13/2009   DYSPNEA 08/13/2009   10:30 AM, 03/25/21 M. Sherlyn Lees, PT, DPT Physical Therapist- [place] Office Number: [phone]   [facility] [address] [place], [place], [address] Phone: [phone]   Fax:  [phone]  Name: TAKINA BUSSER MRN: [id number] Date of Birth: 1966-09-10

## 2021-03-27 ENCOUNTER — Ambulatory Visit (HOSPITAL_COMMUNITY): Payer: 59 | Admitting: Physical Therapy

## 2021-03-27 ENCOUNTER — Other Ambulatory Visit (INDEPENDENT_AMBULATORY_CARE_PROVIDER_SITE_OTHER): Payer: Self-pay | Admitting: Gastroenterology

## 2021-03-28 ENCOUNTER — Other Ambulatory Visit (HOSPITAL_COMMUNITY): Payer: Self-pay

## 2021-03-28 MED ORDER — FAMOTIDINE 40 MG PO TABS
ORAL_TABLET | Freq: Every day | ORAL | 1 refills | Status: DC
  Filled 2021-03-28: qty 30, 30d supply, fill #0
  Filled 2021-04-25: qty 30, 30d supply, fill #1

## 2021-03-28 MED FILL — Amlodipine Besylate Tab 5 MG (Base Equivalent): ORAL | 90 days supply | Qty: 90 | Fill #0 | Status: AC

## 2021-03-31 ENCOUNTER — Ambulatory Visit (HOSPITAL_COMMUNITY): Payer: 59 | Admitting: Physical Therapy

## 2021-03-31 ENCOUNTER — Encounter (HOSPITAL_COMMUNITY): Payer: Self-pay | Admitting: Physical Therapy

## 2021-03-31 ENCOUNTER — Other Ambulatory Visit: Payer: Self-pay

## 2021-03-31 DIAGNOSIS — R262 Difficulty in walking, not elsewhere classified: Secondary | ICD-10-CM

## 2021-03-31 DIAGNOSIS — M6281 Muscle weakness (generalized): Secondary | ICD-10-CM | POA: Diagnosis not present

## 2021-03-31 DIAGNOSIS — M25561 Pain in right knee: Secondary | ICD-10-CM

## 2021-03-31 NOTE — Therapy (Signed)
[place], [place], [address] Phone: [phone]   Fax:  [phone]  Physical Therapy Treatment  Patient Details  Name: [name] MRN: [id number] Date of Birth: December 23, 1965 Referring Provider (PT): Paralee Cancel, MD   Encounter Date: 03/31/2021   PT End of Session - 03/31/21 1007     Visit Number 20    Number of Visits 25    Date for PT Re-Evaluation 04/08/21    Authorization Type [employer] - no auth or VL but review at the 25th visit    Authorization - Visit Number 20    Authorization - Number of Visits 25    Progress Note Due on Visit 26    PT Start Time 1008   late to session   PT Stop Time 1040    PT Time Calculation (min) 32 min    Activity Tolerance Patient tolerated treatment well;Patient limited by pain;No increased pain    Behavior During Therapy [facility] for tasks assessed/performed             Past Medical History:  Diagnosis Date   Anxiety    Arthritis    Asthma    Cervical hyperplasia    hisotry of   Chest pain    cath 2005 norm cors, repeat cath Feb 2011 normal cors and only minimally  elevated pulmonary pressures   Crohn disease ([place])    Diverticulitis of colon 07/17/2014   Diverticulosis    Dyspnea    GERD (gastroesophageal reflux disease)    Hiatal hernia    History of iron deficiency anemia    Hypertension    Idiopathic intracranial hypertension    Per patient   Migraines    Obesity    Palpitations    Pneumonia 2008   14 days hospitalization, bilateral   PONV (postoperative nausea and vomiting)    Sleep apnea sleep study 11/03/2009    cpap; does sometimes, doesnt use every night. weight loss no longer using CPAP   Vitamin B deficiency     Past Surgical History:  Procedure Laterality Date   ABDOMINAL HYSTERECTOMY     ANKLE ARTHROSCOPY  06/01/2012   Procedure: ANKLE ARTHROSCOPY;  Surgeon: Colin Rhein, MD;  Location: [place];  Service:  Orthopedics;  Laterality: Left;  left ankle arthorsocpy with extensive debridement and gastroc slide   ANKLE SURGERY Left 05/11/2018   BIOPSY  04/26/2019   Procedure: BIOPSY;  Surgeon: Rogene Houston, MD;  [facility];  Service: Endoscopy;;  ileum colon   CARDIAC CATHETERIZATION  11/22/2009 and 2005   WNL   CESAREAN SECTION     CHOLECYSTECTOMY  09/08/2006   lap. chole.   CHONDROPLASTY  06/17/2012   Procedure: CHONDROPLASTY;  Surgeon: Carole Civil, MD;  [facility];  Service: Orthopedics;  Laterality: Right;  right patella   COLONOSCOPY N/A 04/26/2019   Procedure: COLONOSCOPY;  Surgeon: Rogene Houston, MD;  [facility];  Service: Endoscopy;  Laterality: N/A;  100   ESOPHAGOGASTRODUODENOSCOPY N/A 05/14/2015   Procedure: ESOPHAGOGASTRODUODENOSCOPY (EGD);  Surgeon: Rogene Houston, MD;  [facility];  Service: Endoscopy;  Laterality: N/A;  [place] N/A 05/24/2019   Procedure: GIVENS CAPSULE STUDY;  Surgeon: Rogene Houston, MD;  [facility];  Service: Endoscopy;  Laterality: N/A;  7:30   INGUINAL HERNIA REPAIR  10/30/2008   right   KNEE ARTHROSCOPY Right 2013  KNEE ARTHROSCOPY WITH LATERAL MENISECTOMY Left 12/23/2016   Procedure: LEFT KNEE ARTHROSCOPY WITH LATERAL MENISECTOMY;  Surgeon: Carole Civil, MD;  [facility];  Service: Orthopedics;  Laterality: Left;   KNEE ARTHROSCOPY WITH MEDIAL MENISECTOMY Left 12/23/2016   Procedure: LEFT KNEE ARTHROSCOPY WITH MEDIAL MENISECTOMY CHONDROPLASTY PATELLA  AND MEDIAL FEMORAL CONDYLE LEFT KNEE;  Surgeon: Carole Civil, MD;  [facility];  Service: Orthopedics;  Laterality: Left;   SHOULDER ARTHROSCOPY WITH SUBACROMIAL DECOMPRESSION AND BICEP TENDON REPAIR Left 12/21/2017   Procedure: Left shoulder arthroscopic biceps tenodesis, SAD, DCR and labrum debridement;  Surgeon: Nicholes Stairs, MD;  Location: [place];  Service: Orthopedics;  Laterality: Left;  120 mins    SHOULDER SURGERY     right x 2    TOTAL KNEE ARTHROPLASTY Left 06/06/2019   Procedure: TOTAL KNEE ARTHROPLASTY;  Surgeon: Paralee Cancel, MD;  [facility];  Service: Orthopedics;  Laterality: Left;  70 mins   TOTAL KNEE ARTHROPLASTY Right 01/23/2021   Procedure: TOTAL KNEE ARTHROPLASTY;  Surgeon: Paralee Cancel, MD;  [facility];  Service: Orthopedics;  Laterality: Right;  70 mins    There were no vitals filed for this visit.   Subjective Assessment - 03/31/21 1011     Subjective States that she is just now feeling like she is getting back to where she was prior toher intense cramping on 5/23. States that she has been on steroid and started the celebrax. STates today is the first morning that she woke up not in pain and last night was the first night she was able to sleep without severe pain in her right knee.    Pertinent History previous knee surgery    Patient Stated Goals to be able to walk without waler    Currently in Pain? Yes    Pain Score 3     Pain Location Knee    Pain Orientation Right    Pain Descriptors / Indicators Aching    Pain Onset 1 to 4 weeks ago                [facility] PT Assessment - 03/31/21 0001       Assessment   Medical Diagnosis R TKA    Referring Provider (PT) Paralee Cancel, MD    Onset Date/Surgical Date 01/23/21    Hand Dominance Right      Observation/Other Assessments   Focus on Therapeutic Outcomes (FOTO)  52% function - predicted 52% function                           [facility] Adult PT Treatment/Exercise - 03/31/21 0001       Knee/Hip Exercises: Aerobic   Recumbent Bike Bike x 7 min level one      Knee/Hip Exercises: Supine   Heel Slides 10 reps   15 second holds   Knee Extension AROM    Knee Extension Limitations 6   lacking   Knee Flexion AROM    Knee Flexion Limitations 100/104(passivve)                    PT Education - 03/31/21 1128     Education Details on FOTO score, on Progress, POC and  HEP    Person(s) Educated Patient    Methods Explanation    Comprehension Verbalized understanding              PT Short Term Goals - 02/19/21 1321  PT SHORT TERM GOAL #1   Title Patient will be independent in self management strategies to improve quality of life and functional outcomes.    Time 3    Period Weeks    Status Achieved    Target Date 02/17/21      PT SHORT TERM GOAL #2   Title Patient will report at least 50% improvement in overall symptoms and/or function to demonstrate improved functional mobility    Time 3    Period Weeks    Status Achieved    Target Date 02/17/21      PT SHORT TERM GOAL #3   Title Patient will be able to ambualte at least 226 feet with least restrictive assistive device in 2 minutes to demonstrate improved walking endurance.    Time 3    Period Weeks    Status Achieved    Target Date 02/17/21      PT SHORT TERM GOAL #4   Title Patient will achieve 0-100 degrees in right knee to demonstrate improved knee motion    Time 3    Period Weeks    Status Achieved    Target Date 02/17/21               PT Long Term Goals - 03/31/21 1029       PT LONG TERM GOAL #1   Title Patient will report at least 75% improvement in overall symptoms and/or function to demonstrate improved functional mobility    Baseline 65%    Time 6    Period Weeks    Status On-going      PT LONG TERM GOAL #2   Title Patient will improve on FOTO score to meet predicted outcomes to demonstrate improved functional mobility.    Time 6    Period Weeks    Status On-going      PT LONG TERM GOAL #3   Title Patient will be able to ambualte at least 226 feet without assistive device in 2 minutes to demonstrate improved walking endurance.    Time 6    Period Weeks    Status Achieved      PT LONG TERM GOAL #4   Title Patient will achieve 0-110 degrees in right knee to demonstrate improved knee motion    Baseline 0-110    Time 6    Period Weeks    Status  On-going                   Plan - 03/31/21 1007     Clinical Impression Statement Patient finally getting over recent exacerbation of symptoms but continues to lack TKE and has residual swelling. Transitioning patient to 1x/week per request and overall progress. Will re-assess next session prior to MD apt on July 8th. Will continue with current POC as tolerated and focus on improving ROM and functional strength.    Personal Factors and Comorbidities Comorbidity 1    Comorbidities hx of rigth knee surgery    Examination-Activity Limitations Bathing;Sleep;Bed Mobility;Squat;Stairs;Stand;Transfers;Locomotion Level;Bend;Sit    Examination-Participation Restrictions Cleaning;Community Activity;Driving;Meal Prep;Occupation    Stability/Clinical Decision Making Stable/Uncomplicated    Rehab Potential Good    PT Frequency 3x / week    PT Duration 6 weeks    PT Treatment/Interventions ADLs/Self Care Home Management;Aquatic Therapy;Cryotherapy;Electrical Stimulation;Moist Heat;Traction;Balance training;Therapeutic exercise;Therapeutic activities;Functional mobility training;Stair training;Gait training;DME Instruction;Neuromuscular re-education;Patient/family education;Manual techniques;Dry needling;Passive range of motion;Joint Manipulations    PT Next Visit Plan reassess; continue to work on squats, box lift, balance; increasing Rt  knee flexion    PT Home Exercise Plan elevation, compression garments, heel slides 5/4 LAQ, SLR, heel slides; 5/11 knee flexion, prone quad stretch; 5/18 STS with equal weight on right LE 6/14 prone quad stretch    Consulted and Agree with Plan of Care Patient             Patient will benefit from skilled therapeutic intervention in order to improve the following deficits and impairments:  Abnormal gait, Decreased endurance, Decreased skin integrity, Pain, Increased edema, Decreased scar mobility, Decreased knowledge of precautions, Decreased balance, Decreased  mobility, Difficulty walking, Decreased range of motion, Decreased strength, Decreased activity tolerance  Visit Diagnosis: Difficulty in walking, not elsewhere classified  Muscle weakness (generalized)  Acute pain of right knee     Problem List Patient Active Problem List   Diagnosis Date Noted   S/P total knee arthroplasty, right 01/23/2021   IIH (idiopathic intracranial hypertension) 10/14/2020   Discoloration of skin of finger 09/12/2020   Bilateral hand pain 09/12/2020   Paresthesia of skin 09/12/2020   Chronic migraine without aura without status migrainosus, not intractable 06/22/2020   Iron deficiency anemia 06/20/2019   S/P left TKA 06/06/2019   Status post total left knee replacement 06/06/2019   Special screening for malignant neoplasms, colon 03/28/2019   LLQ abdominal pain 09/26/2018   Derangement of anterior horn of lateral meniscus of left knee    Derangement of posterior horn of medial meniscus of left knee    Chest pain 04/25/2016   Hypokalemia 04/25/2016   Hyperglycemia 04/25/2016   Diverticulitis of colon 07/17/2014   Nausea without vomiting 07/17/2014   Crohn disease ([place]) 07/17/2014   History of arthroscopy of right knee 07/25/2012   Difficulty in walking(719.7) 06/29/2012   Weakness of right leg 06/29/2012   Posterior tibial tendonitis 11/11/2011   PTTD (posterior tibial tendon dysfunction) 11/11/2011   Knee pain, right 11/11/2011   Chest pain 07/16/2011   Primary osteoarthritis of left knee 02/25/2010   PAIN IN JOINT, MULTIPLE SITES 02/25/2010   Essential hypertension 08/13/2009   HIATAL HERNIA 08/13/2009   PALPITATIONS 08/13/2009   DYSPNEA 08/13/2009   11:32 AM, 03/31/21 Jerene Pitch, DPT Physical Therapy with [facility]  [phone] office   [place] [address] [place], [place], [address] Phone: [phone]   Fax:  [phone]  Name: [name] MRN:  [id number] Date of Birth: 01/14/1966

## 2021-04-02 ENCOUNTER — Ambulatory Visit (HOSPITAL_COMMUNITY): Payer: 59 | Admitting: Physical Therapy

## 2021-04-08 ENCOUNTER — Ambulatory Visit (HOSPITAL_COMMUNITY): Payer: 59 | Attending: Student | Admitting: Physical Therapy

## 2021-04-08 ENCOUNTER — Other Ambulatory Visit: Payer: Self-pay

## 2021-04-08 ENCOUNTER — Encounter (HOSPITAL_COMMUNITY): Payer: Self-pay | Admitting: Physical Therapy

## 2021-04-08 DIAGNOSIS — M25561 Pain in right knee: Secondary | ICD-10-CM

## 2021-04-08 DIAGNOSIS — R262 Difficulty in walking, not elsewhere classified: Secondary | ICD-10-CM

## 2021-04-08 DIAGNOSIS — M6281 Muscle weakness (generalized): Secondary | ICD-10-CM | POA: Diagnosis not present

## 2021-04-08 NOTE — Therapy (Signed)
[place] [address] [place], [place], [address] Phone: [phone]   Fax:  [phone]  Physical Therapy Treatment and Progress Note/RECERT  Patient Details  Name: [name] MRN: [id number] Date of Birth: July 21, 1966 Referring Provider (PT): Paralee Cancel, MD  Progress Note Reporting Period 02/19/21 to 04/08/21  See note below for Objective Data and Assessment of Progress/Goals.      Encounter Date: 04/08/2021   PT End of Session - 04/08/21 1541     Visit Number 21    Number of Visits 27    Date for PT Re-Evaluation 05/20/21    Authorization Type [employer] - no auth or VL but review at the 25th visit    Authorization - Visit Number 20    Authorization - Number of Visits 25    Progress Note Due on Visit 25    PT Start Time 6962   late check in   PT Stop Time 1610    PT Time Calculation (min) 31 min    Activity Tolerance Patient tolerated treatment well;Patient limited by pain;No increased pain    Behavior During Therapy [facility] for tasks assessed/performed             Past Medical History:  Diagnosis Date   Anxiety    Arthritis    Asthma    Cervical hyperplasia    hisotry of   Chest pain    cath 2005 norm cors, repeat cath Feb 2011 normal cors and only minimally  elevated pulmonary pressures   Crohn disease ([place])    Diverticulitis of colon 07/17/2014   Diverticulosis    Dyspnea    GERD (gastroesophageal reflux disease)    Hiatal hernia    History of iron deficiency anemia    Hypertension    Idiopathic intracranial hypertension    Per patient   Migraines    Obesity    Palpitations    Pneumonia 2008   14 days hospitalization, bilateral   PONV (postoperative nausea and vomiting)    Sleep apnea sleep study 11/03/2009    cpap; does sometimes, doesnt use every night. weight loss no longer using CPAP   Vitamin B deficiency     Past Surgical History:  Procedure Laterality Date   ABDOMINAL HYSTERECTOMY      ANKLE ARTHROSCOPY  06/01/2012   Procedure: ANKLE ARTHROSCOPY;  Surgeon: Colin Rhein, MD;  Location: [place];  Service: Orthopedics;  Laterality: Left;  left ankle arthorsocpy with extensive debridement and gastroc slide   ANKLE SURGERY Left 05/11/2018   BIOPSY  04/26/2019   Procedure: BIOPSY;  Surgeon: Rogene Houston, MD;  [facility];  Service: Endoscopy;;  ileum colon   CARDIAC CATHETERIZATION  11/22/2009 and 2005   WNL   CESAREAN SECTION     CHOLECYSTECTOMY  09/08/2006   lap. chole.   CHONDROPLASTY  06/17/2012   Procedure: CHONDROPLASTY;  Surgeon: Carole Civil, MD;  [facility];  Service: Orthopedics;  Laterality: Right;  right patella   COLONOSCOPY N/A 04/26/2019   Procedure: COLONOSCOPY;  Surgeon: Rogene Houston, MD;  [facility];  Service: Endoscopy;  Laterality: N/A;  100   ESOPHAGOGASTRODUODENOSCOPY N/A 05/14/2015   Procedure: ESOPHAGOGASTRODUODENOSCOPY (EGD);  Surgeon: Rogene Houston, MD;  [facility];  Service: Endoscopy;  Laterality: N/A;  [place] N/A 05/24/2019   Procedure: GIVENS CAPSULE STUDY;  Surgeon: Rogene Houston, MD;  [facility];  Service: Endoscopy;  Laterality: N/A;  7:30   INGUINAL HERNIA REPAIR  10/30/2008   right   KNEE ARTHROSCOPY Right 2013   KNEE ARTHROSCOPY WITH LATERAL MENISECTOMY Left 12/23/2016   Procedure: LEFT KNEE ARTHROSCOPY WITH LATERAL MENISECTOMY;  Surgeon: Carole Civil, MD;  [facility];  Service: Orthopedics;  Laterality: Left;   KNEE ARTHROSCOPY WITH MEDIAL MENISECTOMY Left 12/23/2016   Procedure: LEFT KNEE ARTHROSCOPY WITH MEDIAL MENISECTOMY CHONDROPLASTY PATELLA  AND MEDIAL FEMORAL CONDYLE LEFT KNEE;  Surgeon: Carole Civil, MD;  [facility];  Service: Orthopedics;  Laterality: Left;   SHOULDER ARTHROSCOPY WITH SUBACROMIAL DECOMPRESSION AND BICEP TENDON REPAIR Left 12/21/2017   Procedure: Left shoulder arthroscopic biceps  tenodesis, SAD, DCR and labrum debridement;  Surgeon: Nicholes Stairs, MD;  Location: [place];  Service: Orthopedics;  Laterality: Left;  120 mins   SHOULDER SURGERY     right x 2    TOTAL KNEE ARTHROPLASTY Left 06/06/2019   Procedure: TOTAL KNEE ARTHROPLASTY;  Surgeon: Paralee Cancel, MD;  [facility];  Service: Orthopedics;  Laterality: Left;  70 mins   TOTAL KNEE ARTHROPLASTY Right 01/23/2021   Procedure: TOTAL KNEE ARTHROPLASTY;  Surgeon: Paralee Cancel, MD;  [facility];  Service: Orthopedics;  Laterality: Right;  70 mins    There were no vitals filed for this visit.   Subjective Assessment - 04/08/21 1539     Subjective States she feels it when she walks. States overall she feels about 65% better. STates she mostly feels nervy pain with walking. Hopes the MD will let her go back to work.    Pertinent History previous knee surgery    Patient Stated Goals to be able to walk without waler    Currently in Pain? Yes    Pain Score 3     Pain Location Knee    Pain Orientation Right    Pain Descriptors / Indicators Tightness    Pain Onset 1 to 4 weeks ago                [facility] PT Assessment - 04/08/21 0001       Assessment   Medical Diagnosis R TKA    Referring Provider (PT) Paralee Cancel, MD    Onset Date/Surgical Date 01/23/21    Hand Dominance Right    Next MD Visit 04/11/21      Observation/Other Assessments   Focus on Therapeutic Outcomes (FOTO)  52% function - predicted 52% function                           [facility] Adult PT Treatment/Exercise - 04/08/21 0001       Knee/Hip Exercises: Standing   Forward Step Up Right;Step Height: 8";5 reps;3 sets      Knee/Hip Exercises: Seated   Sit to Sand 15 reps;without UE support      Knee/Hip Exercises: Supine   Knee Extension AROM    Knee Extension Limitations 0    Knee Flexion AROM    Knee Flexion Limitations 106      Knee/Hip Exercises: Prone   Other Prone Exercises quadruped rock backs on  pillow - 10-20 second holds - x8                    PT Education - 04/08/21 1605     Education Details on current presentation, on plan, on knee ROM goals and exercises.    Person(s) Educated Patient  Methods Explanation    Comprehension Verbalized understanding              PT Short Term Goals - 02/19/21 1321       PT SHORT TERM GOAL #1   Title Patient will be independent in self management strategies to improve quality of life and functional outcomes.    Time 3    Period Weeks    Status Achieved    Target Date 02/17/21      PT SHORT TERM GOAL #2   Title Patient will report at least 50% improvement in overall symptoms and/or function to demonstrate improved functional mobility    Time 3    Period Weeks    Status Achieved    Target Date 02/17/21      PT SHORT TERM GOAL #3   Title Patient will be able to ambualte at least 226 feet with least restrictive assistive device in 2 minutes to demonstrate improved walking endurance.    Time 3    Period Weeks    Status Achieved    Target Date 02/17/21      PT SHORT TERM GOAL #4   Title Patient will achieve 0-100 degrees in right knee to demonstrate improved knee motion    Time 3    Period Weeks    Status Achieved    Target Date 02/17/21               PT Long Term Goals - 03/31/21 1029       PT LONG TERM GOAL #1   Title Patient will report at least 75% improvement in overall symptoms and/or function to demonstrate improved functional mobility    Baseline 65%    Time 6    Period Weeks    Status On-going      PT LONG TERM GOAL #2   Title Patient will improve on FOTO score to meet predicted outcomes to demonstrate improved functional mobility.    Time 6    Period Weeks    Status On-going      PT LONG TERM GOAL #3   Title Patient will be able to ambualte at least 226 feet without assistive device in 2 minutes to demonstrate improved walking endurance.    Time 6    Period Weeks    Status Achieved       PT LONG TERM GOAL #4   Title Patient will achieve 0-110 degrees in right knee to demonstrate improved knee motion    Baseline 0-110    Time 6    Period Weeks    Status On-going                   Plan - 04/08/21 1544     Clinical Impression Statement Overall patient is doing well. Advanced knee flexion exercise and trialed quadruped knee flexion. This felt alright with slight discomfort across both knees but no pain. Added to HEP but told patient to check in to make sure her knee pain does not return with it. Extending POC additional 6 weeks at 1 time per week to continue to work on functional knee ROM and strength. All short term goals met at this time and one  long term goal met.    Personal Factors and Comorbidities Comorbidity 1    Comorbidities hx of rigth knee surgery    Examination-Activity Limitations Bathing;Sleep;Bed Mobility;Squat;Stairs;Stand;Transfers;Locomotion Level;Bend;Sit    Examination-Participation Restrictions Cleaning;Community Activity;Driving;Meal Prep;Occupation    Stability/Clinical Decision Making Stable/Uncomplicated    Rehab  Potential Good    PT Frequency 1x / week    PT Duration 6 weeks    PT Treatment/Interventions ADLs/Self Care Home Management;Aquatic Therapy;Cryotherapy;Electrical Stimulation;Moist Heat;Traction;Balance training;Therapeutic exercise;Therapeutic activities;Functional mobility training;Stair training;Gait training;DME Instruction;Neuromuscular re-education;Patient/family education;Manual techniques;Dry needling;Passive range of motion;Joint Manipulations    PT Next Visit Plan continue to work on knee flexion,continue to work on squats, box lift, balance; increasing Rt knee flexion    PT Home Exercise Plan elevation, compression garments, heel slides 5/4 LAQ, SLR, heel slides; 5/11 knee flexion, prone quad stretch; 5/18 STS with equal weight on right LE 6/14 prone quad stretch    Consulted and Agree with Plan of Care Patient              Patient will benefit from skilled therapeutic intervention in order to improve the following deficits and impairments:  Abnormal gait, Decreased endurance, Decreased skin integrity, Pain, Increased edema, Decreased scar mobility, Decreased knowledge of precautions, Decreased balance, Decreased mobility, Difficulty walking, Decreased range of motion, Decreased strength, Decreased activity tolerance  Visit Diagnosis: Difficulty in walking, not elsewhere classified  Muscle weakness (generalized)  Acute pain of right knee     Problem List Patient Active Problem List   Diagnosis Date Noted   S/P total knee arthroplasty, right 01/23/2021   IIH (idiopathic intracranial hypertension) 10/14/2020   Discoloration of skin of finger 09/12/2020   Bilateral hand pain 09/12/2020   Paresthesia of skin 09/12/2020   Chronic migraine without aura without status migrainosus, not intractable 06/22/2020   Iron deficiency anemia 06/20/2019   S/P left TKA 06/06/2019   Status post total left knee replacement 06/06/2019   Special screening for malignant neoplasms, colon 03/28/2019   LLQ abdominal pain 09/26/2018   Derangement of anterior horn of lateral meniscus of left knee    Derangement of posterior horn of medial meniscus of left knee    Chest pain 04/25/2016   Hypokalemia 04/25/2016   Hyperglycemia 04/25/2016   Diverticulitis of colon 07/17/2014   Nausea without vomiting 07/17/2014   Crohn disease ([place]) 07/17/2014   History of arthroscopy of right knee 07/25/2012   Difficulty in walking(719.7) 06/29/2012   Weakness of right leg 06/29/2012   Posterior tibial tendonitis 11/11/2011   PTTD (posterior tibial tendon dysfunction) 11/11/2011   Knee pain, right 11/11/2011   Chest pain 07/16/2011   Primary osteoarthritis of left knee 02/25/2010   PAIN IN JOINT, MULTIPLE SITES 02/25/2010   Essential hypertension 08/13/2009   HIATAL HERNIA 08/13/2009   PALPITATIONS 08/13/2009   DYSPNEA  08/13/2009    4:10 PM, 04/08/21 Jerene Pitch, DPT Physical Therapy with [facility]  [phone] office   [place] [address] [place], [place], [address] Phone: [phone]   Fax:  [phone]  Name: ROSEANA RHINE MRN: [id number] Date of Birth: 12-Jul-1966

## 2021-04-10 ENCOUNTER — Encounter (HOSPITAL_COMMUNITY): Payer: 59 | Admitting: Physical Therapy

## 2021-04-15 ENCOUNTER — Encounter (HOSPITAL_COMMUNITY): Payer: Self-pay | Admitting: Physical Therapy

## 2021-04-15 ENCOUNTER — Other Ambulatory Visit: Payer: Self-pay

## 2021-04-15 ENCOUNTER — Ambulatory Visit (HOSPITAL_COMMUNITY): Payer: 59 | Admitting: Physical Therapy

## 2021-04-15 DIAGNOSIS — R262 Difficulty in walking, not elsewhere classified: Secondary | ICD-10-CM

## 2021-04-15 DIAGNOSIS — M6281 Muscle weakness (generalized): Secondary | ICD-10-CM

## 2021-04-15 DIAGNOSIS — M25561 Pain in right knee: Secondary | ICD-10-CM

## 2021-04-15 NOTE — Therapy (Signed)
[facility] [address] [place], [place], [address] Phone: [phone]   Fax:  [phone]  Physical Therapy Treatment  Patient Details  Name: [name] MRN: [id number] Date of Birth: September 24, 1966 Referring Provider (PT): Paralee Cancel, MD   Encounter Date: 04/15/2021   PT End of Session - 04/15/21 1009     Visit Number 22    Number of Visits 27    Date for PT Re-Evaluation 05/20/21    Authorization Type [employer] - no auth or VL but review at the 25th visit    Authorization - Visit Number 21    Authorization - Number of Visits 25    Progress Note Due on Visit 25    PT Start Time 1010   late   PT Stop Time 1050    PT Time Calculation (min) 40 min    Activity Tolerance Patient tolerated treatment well;Patient limited by pain;No increased pain    Behavior During Therapy [facility] for tasks assessed/performed             Past Medical History:  Diagnosis Date   Anxiety    Arthritis    Asthma    Cervical hyperplasia    hisotry of   Chest pain    cath 2005 norm cors, repeat cath Feb 2011 normal cors and only minimally  elevated pulmonary pressures   Crohn disease ([place])    Diverticulitis of colon 07/17/2014   Diverticulosis    Dyspnea    GERD (gastroesophageal reflux disease)    Hiatal hernia    History of iron deficiency anemia    Hypertension    Idiopathic intracranial hypertension    Per patient   Migraines    Obesity    Palpitations    Pneumonia 2008   14 days hospitalization, bilateral   PONV (postoperative nausea and vomiting)    Sleep apnea sleep study 11/03/2009    cpap; does sometimes, doesnt use every night. weight loss no longer using CPAP   Vitamin B deficiency     Past Surgical History:  Procedure Laterality Date   ABDOMINAL HYSTERECTOMY     ANKLE ARTHROSCOPY  06/01/2012   Procedure: ANKLE ARTHROSCOPY;  Surgeon: Colin Rhein, MD;  Location: [place];  Service: Orthopedics;   Laterality: Left;  left ankle arthorsocpy with extensive debridement and gastroc slide   ANKLE SURGERY Left 05/11/2018   BIOPSY  04/26/2019   Procedure: BIOPSY;  Surgeon: Rogene Houston, MD;  [facility];  Service: Endoscopy;;  ileum colon   CARDIAC CATHETERIZATION  11/22/2009 and 2005   WNL   CESAREAN SECTION     CHOLECYSTECTOMY  09/08/2006   lap. chole.   CHONDROPLASTY  06/17/2012   Procedure: CHONDROPLASTY;  Surgeon: Carole Civil, MD;  [facility];  Service: Orthopedics;  Laterality: Right;  right patella   COLONOSCOPY N/A 04/26/2019   Procedure: COLONOSCOPY;  Surgeon: Rogene Houston, MD;  [facility];  Service: Endoscopy;  Laterality: N/A;  100   ESOPHAGOGASTRODUODENOSCOPY N/A 05/14/2015   Procedure: ESOPHAGOGASTRODUODENOSCOPY (EGD);  Surgeon: Rogene Houston, MD;  [facility];  Service: Endoscopy;  Laterality: N/A;  [place] N/A 05/24/2019   Procedure: GIVENS CAPSULE STUDY;  Surgeon: Rogene Houston, MD;  [facility];  Service: Endoscopy;  Laterality: N/A;  7:30   INGUINAL HERNIA REPAIR  10/30/2008   right   KNEE ARTHROSCOPY Right 2013  KNEE ARTHROSCOPY WITH LATERAL MENISECTOMY Left 12/23/2016   Procedure: LEFT KNEE ARTHROSCOPY WITH LATERAL MENISECTOMY;  Surgeon: Carole Civil, MD;  [facility];  Service: Orthopedics;  Laterality: Left;   KNEE ARTHROSCOPY WITH MEDIAL MENISECTOMY Left 12/23/2016   Procedure: LEFT KNEE ARTHROSCOPY WITH MEDIAL MENISECTOMY CHONDROPLASTY PATELLA  AND MEDIAL FEMORAL CONDYLE LEFT KNEE;  Surgeon: Carole Civil, MD;  [facility];  Service: Orthopedics;  Laterality: Left;   SHOULDER ARTHROSCOPY WITH SUBACROMIAL DECOMPRESSION AND BICEP TENDON REPAIR Left 12/21/2017   Procedure: Left shoulder arthroscopic biceps tenodesis, SAD, DCR and labrum debridement;  Surgeon: Nicholes Stairs, MD;  Location: [place];  Service: Orthopedics;  Laterality: Left;  120 mins   SHOULDER  SURGERY     right x 2    TOTAL KNEE ARTHROPLASTY Left 06/06/2019   Procedure: TOTAL KNEE ARTHROPLASTY;  Surgeon: Paralee Cancel, MD;  [facility];  Service: Orthopedics;  Laterality: Left;  70 mins   TOTAL KNEE ARTHROPLASTY Right 01/23/2021   Procedure: TOTAL KNEE ARTHROPLASTY;  Surgeon: Paralee Cancel, MD;  [facility];  Service: Orthopedics;  Laterality: Right;  70 mins    There were no vitals filed for this visit.   Subjective Assessment - 04/15/21 1015     Subjective States that she saw the MD and he wants her to continue therapy. States that she is going back  days at work starting on 7/18. Then august 1st she can return to full duty. States she needs to limit her standing and walking.  Current pain is 3/10 in her knee.    Pertinent History previous knee surgery    Patient Stated Goals to be able to walk without waler    Currently in Pain? Yes    Pain Score 3     Pain Location Knee    Pain Orientation Right    Pain Descriptors / Indicators Tightness    Pain Onset 1 to 4 weeks ago                [facility] PT Assessment - 04/15/21 0001       Assessment   Medical Diagnosis R TKA    Referring Provider (PT) Paralee Cancel, MD    Onset Date/Surgical Date 01/23/21    Hand Dominance Right    Next MD Visit april of next year                           [facility] Adult PT Treatment/Exercise - 04/15/21 0001       Knee/Hip Exercises: Stretches   Hip Flexor Stretch Right   thomas 5" holds x15     Knee/Hip Exercises: Standing   Forward Step Up Right;Step Height: 8";15 reps    Other Standing Knee Exercises warrior I x4 15" holds Bilateral- verbal and visual cues    Other Standing Knee Exercises squats - visual and verbal cues 2x12; patella mobilizations - R  6 minutes                    PT Education - 04/15/21 1020     Education Details on POC, on 1x/week on use of compression stockings for swelling.    Person(s) Educated Patient    Methods  Explanation    Comprehension Verbalized understanding              PT Short Term Goals - 02/19/21 1321       PT SHORT TERM GOAL #1  Title Patient will be independent in self management strategies to improve quality of life and functional outcomes.    Time 3    Period Weeks    Status Achieved    Target Date 02/17/21      PT SHORT TERM GOAL #2   Title Patient will report at least 50% improvement in overall symptoms and/or function to demonstrate improved functional mobility    Time 3    Period Weeks    Status Achieved    Target Date 02/17/21      PT SHORT TERM GOAL #3   Title Patient will be able to ambualte at least 226 feet with least restrictive assistive device in 2 minutes to demonstrate improved walking endurance.    Time 3    Period Weeks    Status Achieved    Target Date 02/17/21      PT SHORT TERM GOAL #4   Title Patient will achieve 0-100 degrees in right knee to demonstrate improved knee motion    Time 3    Period Weeks    Status Achieved    Target Date 02/17/21               PT Long Term Goals - 03/31/21 1029       PT LONG TERM GOAL #1   Title Patient will report at least 75% improvement in overall symptoms and/or function to demonstrate improved functional mobility    Baseline 65%    Time 6    Period Weeks    Status On-going      PT LONG TERM GOAL #2   Title Patient will improve on FOTO score to meet predicted outcomes to demonstrate improved functional mobility.    Time 6    Period Weeks    Status On-going      PT LONG TERM GOAL #3   Title Patient will be able to ambualte at least 226 feet without assistive device in 2 minutes to demonstrate improved walking endurance.    Time 6    Period Weeks    Status Achieved      PT LONG TERM GOAL #4   Title Patient will achieve 0-110 degrees in right knee to demonstrate improved knee motion    Baseline 0-110    Time 6    Period Weeks    Status On-going                   Plan -  04/15/21 1054     Clinical Impression Statement Session focused on LE strengthening. Fatigue noted in legs. Increased tightness in knee noted after squats and warrior pose. Transitioned to inferior patella mobilization and this resolved 90% of the tightness. Added Thomas stretch which patient tolerated moderately well with cramping noted in leg at times. Will continue with current POC as tolerated.    Personal Factors and Comorbidities Comorbidity 1    Comorbidities hx of rigth knee surgery    Examination-Activity Limitations Bathing;Sleep;Bed Mobility;Squat;Stairs;Stand;Transfers;Locomotion Level;Bend;Sit    Examination-Participation Restrictions Cleaning;Community Activity;Driving;Meal Prep;Occupation    Stability/Clinical Decision Making Stable/Uncomplicated    Rehab Potential Good    PT Frequency 1x / week    PT Duration 6 weeks    PT Treatment/Interventions ADLs/Self Care Home Management;Aquatic Therapy;Cryotherapy;Electrical Stimulation;Moist Heat;Traction;Balance training;Therapeutic exercise;Therapeutic activities;Functional mobility training;Stair training;Gait training;DME Instruction;Neuromuscular re-education;Patient/family education;Manual techniques;Dry needling;Passive range of motion;Joint Manipulations    PT Next Visit Plan continue to work on knee flexion,continue to work on squats, box lift, balance; increasing Rt knee flexion  PT Home Exercise Plan elevation, compression garments, heel slides 5/4 LAQ, SLR, heel slides; 5/11 knee flexion, prone quad stretch; 5/18 STS with equal weight on right LE 6/14 prone quad stretch; 7/12 thomas stretch, squat, warrior I pose, inf patella mobilization.    Consulted and Agree with Plan of Care Patient             Patient will benefit from skilled therapeutic intervention in order to improve the following deficits and impairments:  Abnormal gait, Decreased endurance, Decreased skin integrity, Pain, Increased edema, Decreased scar  mobility, Decreased knowledge of precautions, Decreased balance, Decreased mobility, Difficulty walking, Decreased range of motion, Decreased strength, Decreased activity tolerance  Visit Diagnosis: Muscle weakness (generalized)  Difficulty in walking, not elsewhere classified  Acute pain of right knee     Problem List Patient Active Problem List   Diagnosis Date Noted   S/P total knee arthroplasty, right 01/23/2021   IIH (idiopathic intracranial hypertension) 10/14/2020   Discoloration of skin of finger 09/12/2020   Bilateral hand pain 09/12/2020   Paresthesia of skin 09/12/2020   Chronic migraine without aura without status migrainosus, not intractable 06/22/2020   Iron deficiency anemia 06/20/2019   S/P left TKA 06/06/2019   Status post total left knee replacement 06/06/2019   Special screening for malignant neoplasms, colon 03/28/2019   LLQ abdominal pain 09/26/2018   Derangement of anterior horn of lateral meniscus of left knee    Derangement of posterior horn of medial meniscus of left knee    Chest pain 04/25/2016   Hypokalemia 04/25/2016   Hyperglycemia 04/25/2016   Diverticulitis of colon 07/17/2014   Nausea without vomiting 07/17/2014   Crohn disease ([place]) 07/17/2014   History of arthroscopy of right knee 07/25/2012   Difficulty in walking(719.7) 06/29/2012   Weakness of right leg 06/29/2012   Posterior tibial tendonitis 11/11/2011   PTTD (posterior tibial tendon dysfunction) 11/11/2011   Knee pain, right 11/11/2011   Chest pain 07/16/2011   Primary osteoarthritis of left knee 02/25/2010   PAIN IN JOINT, MULTIPLE SITES 02/25/2010   Essential hypertension 08/13/2009   HIATAL HERNIA 08/13/2009   PALPITATIONS 08/13/2009   DYSPNEA 08/13/2009   10:56 AM, 04/15/21 Jerene Pitch, DPT Physical Therapy with [facility]  [phone] office   [facility] [address] [place], [place],  [address] Phone: [phone]   Fax:  [phone]  Name: [name] MRN: [id number] Date of Birth: [birth year]

## 2021-04-17 ENCOUNTER — Encounter (HOSPITAL_COMMUNITY): Payer: 59 | Admitting: Physical Therapy

## 2021-04-22 ENCOUNTER — Encounter (HOSPITAL_COMMUNITY): Payer: 59

## 2021-04-22 DIAGNOSIS — D511 Vitamin B12 deficiency anemia due to selective vitamin B12 malabsorption with proteinuria: Secondary | ICD-10-CM | POA: Diagnosis not present

## 2021-04-23 ENCOUNTER — Other Ambulatory Visit: Payer: Self-pay

## 2021-04-23 ENCOUNTER — Ambulatory Visit (HOSPITAL_COMMUNITY): Payer: 59 | Admitting: Physical Therapy

## 2021-04-23 ENCOUNTER — Encounter (HOSPITAL_COMMUNITY): Payer: Self-pay | Admitting: Physical Therapy

## 2021-04-23 DIAGNOSIS — M6281 Muscle weakness (generalized): Secondary | ICD-10-CM | POA: Diagnosis not present

## 2021-04-23 DIAGNOSIS — R262 Difficulty in walking, not elsewhere classified: Secondary | ICD-10-CM

## 2021-04-23 DIAGNOSIS — M25561 Pain in right knee: Secondary | ICD-10-CM | POA: Diagnosis not present

## 2021-04-23 NOTE — Therapy (Signed)
[facility], [place], [address] Phone: [phone]   Fax:  [phone]  Physical Therapy Treatment  Patient Details  Name: [name] MRN: [id number] Date of Birth: 1966/02/23 Referring Provider (PT): Paralee Cancel, MD   Encounter Date: 04/23/2021   PT End of Session - 04/23/21 1539     Visit Number 23    Number of Visits 27    Date for PT Re-Evaluation 05/20/21    Authorization Type [employer] - no auth or VL but review at the 25th visit    Authorization - Visit Number 22    Authorization - Number of Visits 25    Progress Note Due on Visit 25    PT Start Time 1539   late to session   PT Stop Time 1614    PT Time Calculation (min) 35 min    Activity Tolerance Patient tolerated treatment well;Patient limited by pain;No increased pain    Behavior During Therapy [facility] for tasks assessed/performed             Past Medical History:  Diagnosis Date   Anxiety    Arthritis    Asthma    Cervical hyperplasia    hisotry of   Chest pain    cath 2005 norm cors, repeat cath Feb 2011 normal cors and only minimally  elevated pulmonary pressures   Crohn disease ([place])    Diverticulitis of colon 07/17/2014   Diverticulosis    Dyspnea    GERD (gastroesophageal reflux disease)    Hiatal hernia    History of iron deficiency anemia    Hypertension    Idiopathic intracranial hypertension    Per patient   Migraines    Obesity    Palpitations    Pneumonia 2008   14 days hospitalization, bilateral   PONV (postoperative nausea and vomiting)    Sleep apnea sleep study 11/03/2009    cpap; does sometimes, doesnt use every night. weight loss no longer using CPAP   Vitamin B deficiency     Past Surgical History:  Procedure Laterality Date   ABDOMINAL HYSTERECTOMY     ANKLE ARTHROSCOPY  06/01/2012   Procedure: ANKLE ARTHROSCOPY;  Surgeon: Colin Rhein, MD;  Location: [place];  Service:  Orthopedics;  Laterality: Left;  left ankle arthorsocpy with extensive debridement and gastroc slide   ANKLE SURGERY Left 05/11/2018   BIOPSY  04/26/2019   Procedure: BIOPSY;  Surgeon: Rogene Houston, MD;  [facility];  Service: Endoscopy;;  ileum colon   CARDIAC CATHETERIZATION  11/22/2009 and 2005   WNL   CESAREAN SECTION     CHOLECYSTECTOMY  09/08/2006   lap. chole.   CHONDROPLASTY  06/17/2012   Procedure: CHONDROPLASTY;  Surgeon: Carole Civil, MD;  [facility];  Service: Orthopedics;  Laterality: Right;  right patella   COLONOSCOPY N/A 04/26/2019   Procedure: COLONOSCOPY;  Surgeon: Rogene Houston, MD;  [facility];  Service: Endoscopy;  Laterality: N/A;  100   ESOPHAGOGASTRODUODENOSCOPY N/A 05/14/2015   Procedure: ESOPHAGOGASTRODUODENOSCOPY (EGD);  Surgeon: Rogene Houston, MD;  [facility];  Service: Endoscopy;  Laterality: N/A;  [place] N/A 05/24/2019   Procedure: GIVENS CAPSULE STUDY;  Surgeon: Rogene Houston, MD;  [facility];  Service: Endoscopy;  Laterality: N/A;  7:30   INGUINAL HERNIA REPAIR  10/30/2008   right   KNEE ARTHROSCOPY Right 2013  KNEE ARTHROSCOPY WITH LATERAL MENISECTOMY Left 12/23/2016   Procedure: LEFT KNEE ARTHROSCOPY WITH LATERAL MENISECTOMY;  Surgeon: Carole Civil, MD;  [facility];  Service: Orthopedics;  Laterality: Left;   KNEE ARTHROSCOPY WITH MEDIAL MENISECTOMY Left 12/23/2016   Procedure: LEFT KNEE ARTHROSCOPY WITH MEDIAL MENISECTOMY CHONDROPLASTY PATELLA  AND MEDIAL FEMORAL CONDYLE LEFT KNEE;  Surgeon: Carole Civil, MD;  [facility];  Service: Orthopedics;  Laterality: Left;   SHOULDER ARTHROSCOPY WITH SUBACROMIAL DECOMPRESSION AND BICEP TENDON REPAIR Left 12/21/2017   Procedure: Left shoulder arthroscopic biceps tenodesis, SAD, DCR and labrum debridement;  Surgeon: Nicholes Stairs, MD;  Location: [place];  Service: Orthopedics;  Laterality: Left;  120 mins    SHOULDER SURGERY     right x 2    TOTAL KNEE ARTHROPLASTY Left 06/06/2019   Procedure: TOTAL KNEE ARTHROPLASTY;  Surgeon: Paralee Cancel, MD;  [facility];  Service: Orthopedics;  Laterality: Left;  70 mins   TOTAL KNEE ARTHROPLASTY Right 01/23/2021   Procedure: TOTAL KNEE ARTHROPLASTY;  Surgeon: Paralee Cancel, MD;  [facility];  Service: Orthopedics;  Laterality: Right;  70 mins    There were no vitals filed for this visit.   Subjective Assessment - 04/23/21 1543     Subjective States that on Monday she doesn't know what she did but she must of twisted her knee because she was having the nerve pain again. States that today is better. States that she is tired because she hasn't slept as her mother is at the hospital. States she forgot her sneakers    Pertinent History previous knee surgery    Patient Stated Goals to be able to walk without waler    Currently in Pain? Yes    Pain Score 4     Pain Location Knee    Pain Orientation Right    Pain Descriptors / Indicators Tightness;Aching    Pain Onset 1 to 4 weeks ago                [facility] PT Assessment - 04/23/21 0001       Assessment   Medical Diagnosis R TKA    Referring Provider (PT) Paralee Cancel, MD                           [facility] Adult PT Treatment/Exercise - 04/23/21 0001       Knee/Hip Exercises: Standing   Other Standing Knee Exercises hip flexor stretch supine - not tolerated well - standing hip flexor stretch with glute isometric - not tolerated    Other Standing Knee Exercises tension release with right foot on 12" step and forward flexion- deep breathing x3 60" holds      Knee/Hip Exercises: Seated   Other Seated Knee/Hip Exercises slef mobilization with stick 6 minutes, patella mobilizations 5 minutes.      Manual Therapy   Manual Therapy Joint mobilization    Manual therapy comments Manual complete separate than rest of tx    Joint Mobilization PA to lumbar spine grade III, AP to  right femur - grade III                      PT Short Term Goals - 02/19/21 1321       PT SHORT TERM GOAL #1   Title Patient will be independent in self management strategies to improve quality of life and functional outcomes.    Time 3  Period Weeks    Status Achieved    Target Date 02/17/21      PT SHORT TERM GOAL #2   Title Patient will report at least 50% improvement in overall symptoms and/or function to demonstrate improved functional mobility    Time 3    Period Weeks    Status Achieved    Target Date 02/17/21      PT SHORT TERM GOAL #3   Title Patient will be able to ambualte at least 226 feet with least restrictive assistive device in 2 minutes to demonstrate improved walking endurance.    Time 3    Period Weeks    Status Achieved    Target Date 02/17/21      PT SHORT TERM GOAL #4   Title Patient will achieve 0-100 degrees in right knee to demonstrate improved knee motion    Time 3    Period Weeks    Status Achieved    Target Date 02/17/21               PT Long Term Goals - 03/31/21 1029       PT LONG TERM GOAL #1   Title Patient will report at least 75% improvement in overall symptoms and/or function to demonstrate improved functional mobility    Baseline 65%    Time 6    Period Weeks    Status On-going      PT LONG TERM GOAL #2   Title Patient will improve on FOTO score to meet predicted outcomes to demonstrate improved functional mobility.    Time 6    Period Weeks    Status On-going      PT LONG TERM GOAL #3   Title Patient will be able to ambualte at least 226 feet without assistive device in 2 minutes to demonstrate improved walking endurance.    Time 6    Period Weeks    Status Achieved      PT LONG TERM GOAL #4   Title Patient will achieve 0-110 degrees in right knee to demonstrate improved knee motion    Baseline 0-110    Time 6    Period Weeks    Status On-going                   Plan - 04/23/21 1612      Clinical Impression Statement Increase pain, tightness and ROM limitations noted on this date. Tolerated AP of right hip well with reports of reduced tension in hip and reduced tension in posterior hip region with palpation. Hip flexor stretches not tolerated, transitioned to tension relief position which was tolerated better. Continued tightness noted but reduced symptoms noted end of session. Will follow up with tension relief positioning next session.    Personal Factors and Comorbidities Comorbidity 1    Comorbidities hx of rigth knee surgery    Examination-Activity Limitations Bathing;Sleep;Bed Mobility;Squat;Stairs;Stand;Transfers;Locomotion Level;Bend;Sit    Examination-Participation Restrictions Cleaning;Community Activity;Driving;Meal Prep;Occupation    Stability/Clinical Decision Making Stable/Uncomplicated    Rehab Potential Good    PT Frequency 1x / week    PT Duration 6 weeks    PT Treatment/Interventions ADLs/Self Care Home Management;Aquatic Therapy;Cryotherapy;Electrical Stimulation;Moist Heat;Traction;Balance training;Therapeutic exercise;Therapeutic activities;Functional mobility training;Stair training;Gait training;DME Instruction;Neuromuscular re-education;Patient/family education;Manual techniques;Dry needling;Passive range of motion;Joint Manipulations    PT Next Visit Plan f/u with hip flexor pain/nerve pain    PT Home Exercise Plan elevation, compression garments, heel slides 5/4 LAQ, SLR, heel slides; 5/11 knee flexion, prone quad stretch; 5/18  STS with equal weight on right LE 6/14 prone quad stretch; 7/12 thomas stretch, squat, warrior I pose, inf patella mobilization.    Consulted and Agree with Plan of Care Patient             Patient will benefit from skilled therapeutic intervention in order to improve the following deficits and impairments:  Abnormal gait, Decreased endurance, Decreased skin integrity, Pain, Increased edema, Decreased scar mobility, Decreased  knowledge of precautions, Decreased balance, Decreased mobility, Difficulty walking, Decreased range of motion, Decreased strength, Decreased activity tolerance  Visit Diagnosis: Muscle weakness (generalized)  Difficulty in walking, not elsewhere classified  Acute pain of right knee     Problem List Patient Active Problem List   Diagnosis Date Noted   S/P total knee arthroplasty, right 01/23/2021   IIH (idiopathic intracranial hypertension) 10/14/2020   Discoloration of skin of finger 09/12/2020   Bilateral hand pain 09/12/2020   Paresthesia of skin 09/12/2020   Chronic migraine without aura without status migrainosus, not intractable 06/22/2020   Iron deficiency anemia 06/20/2019   S/P left TKA 06/06/2019   Status post total left knee replacement 06/06/2019   Special screening for malignant neoplasms, colon 03/28/2019   LLQ abdominal pain 09/26/2018   Derangement of anterior horn of lateral meniscus of left knee    Derangement of posterior horn of medial meniscus of left knee    Chest pain 04/25/2016   Hypokalemia 04/25/2016   Hyperglycemia 04/25/2016   Diverticulitis of colon 07/17/2014   Nausea without vomiting 07/17/2014   Crohn disease ([place]) 07/17/2014   History of arthroscopy of right knee 07/25/2012   Difficulty in walking(719.7) 06/29/2012   Weakness of right leg 06/29/2012   Posterior tibial tendonitis 11/11/2011   PTTD (posterior tibial tendon dysfunction) 11/11/2011   Knee pain, right 11/11/2011   Chest pain 07/16/2011   Primary osteoarthritis of left knee 02/25/2010   PAIN IN JOINT, MULTIPLE SITES 02/25/2010   Essential hypertension 08/13/2009   HIATAL HERNIA 08/13/2009   PALPITATIONS 08/13/2009   DYSPNEA 08/13/2009    4:15 PM, 04/23/21 Jerene Pitch, DPT Physical Therapy with [facility]  [phone] office   [place] [address] [place], [place], [address] Phone: [phone]    Fax:  [phone]  Name: LEIA COLETTI MRN: [id number] Date of Birth: January 16, 1966

## 2021-04-24 ENCOUNTER — Encounter (HOSPITAL_COMMUNITY): Payer: 59 | Admitting: Physical Therapy

## 2021-04-25 ENCOUNTER — Other Ambulatory Visit (HOSPITAL_COMMUNITY): Payer: Self-pay

## 2021-04-28 ENCOUNTER — Other Ambulatory Visit (HOSPITAL_COMMUNITY): Payer: Self-pay

## 2021-04-28 MED ORDER — MONTELUKAST SODIUM 10 MG PO TABS
10.0000 mg | ORAL_TABLET | Freq: Every evening | ORAL | 2 refills | Status: DC
  Filled 2021-04-28: qty 90, 90d supply, fill #0
  Filled 2021-08-08: qty 90, 90d supply, fill #1
  Filled 2021-12-02: qty 90, 90d supply, fill #2

## 2021-04-29 ENCOUNTER — Other Ambulatory Visit: Payer: Self-pay

## 2021-04-29 ENCOUNTER — Other Ambulatory Visit (HOSPITAL_COMMUNITY): Payer: Self-pay

## 2021-04-29 ENCOUNTER — Ambulatory Visit (HOSPITAL_COMMUNITY): Payer: 59

## 2021-04-29 DIAGNOSIS — M6281 Muscle weakness (generalized): Secondary | ICD-10-CM

## 2021-04-29 DIAGNOSIS — M25561 Pain in right knee: Secondary | ICD-10-CM

## 2021-04-29 DIAGNOSIS — R262 Difficulty in walking, not elsewhere classified: Secondary | ICD-10-CM | POA: Diagnosis not present

## 2021-04-29 NOTE — Therapy (Signed)
[place], [place], [address] Phone: [phone]   Fax:  [phone]  Physical Therapy Treatment  Patient Details  Name: [name] MRN: [id number] Date of Birth: 07/30/1966 Referring Provider (PT): Paralee Cancel, MD   Encounter Date: 04/29/2021   PT End of Session - 04/29/21 1713     Visit Number 24    Number of Visits 27    Date for PT Re-Evaluation 05/20/21    Authorization Type [employer] - no auth or VL but review at the 25th visit    Authorization - Visit Number 24    Authorization - Number of Visits 25    Progress Note Due on Visit 25    PT Start Time 0932    PT Stop Time 1753    PT Time Calculation (min) 40 min    Activity Tolerance Patient tolerated treatment well;Patient limited by pain;No increased pain    Behavior During Therapy [facility] for tasks assessed/performed             Past Medical History:  Diagnosis Date   Anxiety    Arthritis    Asthma    Cervical hyperplasia    hisotry of   Chest pain    cath 2005 norm cors, repeat cath Feb 2011 normal cors and only minimally  elevated pulmonary pressures   Crohn disease ([place])    Diverticulitis of colon 07/17/2014   Diverticulosis    Dyspnea    GERD (gastroesophageal reflux disease)    Hiatal hernia    History of iron deficiency anemia    Hypertension    Idiopathic intracranial hypertension    Per patient   Migraines    Obesity    Palpitations    Pneumonia 2008   14 days hospitalization, bilateral   PONV (postoperative nausea and vomiting)    Sleep apnea sleep study 11/03/2009    cpap; does sometimes, doesnt use every night. weight loss no longer using CPAP   Vitamin B deficiency     Past Surgical History:  Procedure Laterality Date   ABDOMINAL HYSTERECTOMY     ANKLE ARTHROSCOPY  06/01/2012   Procedure: ANKLE ARTHROSCOPY;  Surgeon: Colin Rhein, MD;  Location: [place];  Service: Orthopedics;  Laterality:  Left;  left ankle arthorsocpy with extensive debridement and gastroc slide   ANKLE SURGERY Left 05/11/2018   BIOPSY  04/26/2019   Procedure: BIOPSY;  Surgeon: Rogene Houston, MD;  [facility];  Service: Endoscopy;;  ileum colon   CARDIAC CATHETERIZATION  11/22/2009 and 2005   WNL   CESAREAN SECTION     CHOLECYSTECTOMY  09/08/2006   lap. chole.   CHONDROPLASTY  06/17/2012   Procedure: CHONDROPLASTY;  Surgeon: Carole Civil, MD;  [facility];  Service: Orthopedics;  Laterality: Right;  right patella   COLONOSCOPY N/A 04/26/2019   Procedure: COLONOSCOPY;  Surgeon: Rogene Houston, MD;  [facility];  Service: Endoscopy;  Laterality: N/A;  100   ESOPHAGOGASTRODUODENOSCOPY N/A 05/14/2015   Procedure: ESOPHAGOGASTRODUODENOSCOPY (EGD);  Surgeon: Rogene Houston, MD;  [facility];  Service: Endoscopy;  Laterality: N/A;  [place] N/A 05/24/2019   Procedure: GIVENS CAPSULE STUDY;  Surgeon: Rogene Houston, MD;  [facility];  Service: Endoscopy;  Laterality: N/A;  7:30   INGUINAL HERNIA REPAIR  10/30/2008   right   KNEE ARTHROSCOPY Right 2013   KNEE ARTHROSCOPY  WITH LATERAL MENISECTOMY Left 12/23/2016   Procedure: LEFT KNEE ARTHROSCOPY WITH LATERAL MENISECTOMY;  Surgeon: Carole Civil, MD;  [facility];  Service: Orthopedics;  Laterality: Left;   KNEE ARTHROSCOPY WITH MEDIAL MENISECTOMY Left 12/23/2016   Procedure: LEFT KNEE ARTHROSCOPY WITH MEDIAL MENISECTOMY CHONDROPLASTY PATELLA  AND MEDIAL FEMORAL CONDYLE LEFT KNEE;  Surgeon: Carole Civil, MD;  [facility];  Service: Orthopedics;  Laterality: Left;   SHOULDER ARTHROSCOPY WITH SUBACROMIAL DECOMPRESSION AND BICEP TENDON REPAIR Left 12/21/2017   Procedure: Left shoulder arthroscopic biceps tenodesis, SAD, DCR and labrum debridement;  Surgeon: Nicholes Stairs, MD;  Location: [place];  Service: Orthopedics;  Laterality: Left;  120 mins   SHOULDER SURGERY      right x 2    TOTAL KNEE ARTHROPLASTY Left 06/06/2019   Procedure: TOTAL KNEE ARTHROPLASTY;  Surgeon: Paralee Cancel, MD;  [facility];  Service: Orthopedics;  Laterality: Left;  70 mins   TOTAL KNEE ARTHROPLASTY Right 01/23/2021   Procedure: TOTAL KNEE ARTHROPLASTY;  Surgeon: Paralee Cancel, MD;  [facility];  Service: Orthopedics;  Laterality: Right;  70 mins    There were no vitals filed for this visit.   Subjective Assessment - 04/29/21 1726     Subjective Continued anterior right knee pain (indicates along quadriceps tendon), feels like a "nervy" pain    Pertinent History previous knee surgery    Patient Stated Goals to be able to walk without waler    Currently in Pain? Yes    Pain Score 4     Pain Location Knee    Pain Orientation Right    Pain Descriptors / Indicators Tightness;Aching;Shooting    Pain Type Acute pain    Pain Onset 1 to 4 weeks ago                [facility] PT Assessment - 04/29/21 0001       AROM   Right Knee Extension 0    Right Knee Flexion 107      Strength   Strength Assessment Site Knee    Right/Left Knee Right    Right Knee Flexion 4/5    Right Knee Extension 4/5                           [facility] Adult PT Treatment/Exercise - 04/29/21 0001       Knee/Hip Exercises: Stretches   Sports administrator Right;4 reps;60 seconds    Quad Stretch Limitations prone    Gastroc Stretch Right;4 reps;60 seconds      Knee/Hip Exercises: Supine   Knee Extension AROM    Knee Extension Limitations 0    Knee Flexion AROM    Knee Flexion Limitations 107      Manual Therapy   Manual Therapy Joint mobilization;Soft tissue mobilization    Manual therapy comments Manual complete separate than rest of tx    Joint Mobilization patellar mobilizations all directions 20x5 sec end-range hold. Long-axis distraction to right tib-fem. Knee placed in flexion and performing anterior tibial translations. Contract-relax stretching to quadriceps to increase  knee flexion    Soft tissue mobilization massage gun along right quad for pain relief and muscle facilitation                      PT Short Term Goals - 02/19/21 1321       PT SHORT TERM GOAL #1   Title Patient will be independent in  self management strategies to improve quality of life and functional outcomes.    Time 3    Period Weeks    Status Achieved    Target Date 02/17/21      PT SHORT TERM GOAL #2   Title Patient will report at least 50% improvement in overall symptoms and/or function to demonstrate improved functional mobility    Time 3    Period Weeks    Status Achieved    Target Date 02/17/21      PT SHORT TERM GOAL #3   Title Patient will be able to ambualte at least 226 feet with least restrictive assistive device in 2 minutes to demonstrate improved walking endurance.    Time 3    Period Weeks    Status Achieved    Target Date 02/17/21      PT SHORT TERM GOAL #4   Title Patient will achieve 0-100 degrees in right knee to demonstrate improved knee motion    Time 3    Period Weeks    Status Achieved    Target Date 02/17/21               PT Long Term Goals - 03/31/21 1029       PT LONG TERM GOAL #1   Title Patient will report at least 75% improvement in overall symptoms and/or function to demonstrate improved functional mobility    Baseline 65%    Time 6    Period Weeks    Status On-going      PT LONG TERM GOAL #2   Title Patient will improve on FOTO score to meet predicted outcomes to demonstrate improved functional mobility.    Time 6    Period Weeks    Status On-going      PT LONG TERM GOAL #3   Title Patient will be able to ambualte at least 226 feet without assistive device in 2 minutes to demonstrate improved walking endurance.    Time 6    Period Weeks    Status Achieved      PT LONG TERM GOAL #4   Title Patient will achieve 0-110 degrees in right knee to demonstrate improved knee motion    Baseline 0-110    Time 6     Period Weeks    Status On-going                   Plan - 04/29/21 1757     Clinical Impression Statement Continues to demonstrate anterior right knee pain along quadriceps tendon. Pain with stretching into knee flexion (quad stretch) but no significant discomfort with manual muscle test of right quadriceps and demonstrates a rather robust 4/5 strength in open chain position, full extension. No change in symptoms with manual therapy to knee but with some decrease in pain with anterior tibia translations. No significant increase in ROM with contract-relax technique.    Personal Factors and Comorbidities Comorbidity 1    Comorbidities hx of rigth knee surgery    Examination-Activity Limitations Bathing;Sleep;Bed Mobility;Squat;Stairs;Stand;Transfers;Locomotion Level;Bend;Sit    Examination-Participation Restrictions Cleaning;Community Activity;Driving;Meal Prep;Occupation    Stability/Clinical Decision Making Stable/Uncomplicated    Rehab Potential Good    PT Frequency 1x / week    PT Duration 6 weeks    PT Treatment/Interventions ADLs/Self Care Home Management;Aquatic Therapy;Cryotherapy;Electrical Stimulation;Moist Heat;Traction;Balance training;Therapeutic exercise;Therapeutic activities;Functional mobility training;Stair training;Gait training;DME Instruction;Neuromuscular re-education;Patient/family education;Manual techniques;Dry needling;Passive range of motion;Joint Manipulations    PT Next Visit Plan f/u with hip flexor pain/nerve pain  PT Home Exercise Plan elevation, compression garments, heel slides 5/4 LAQ, SLR, heel slides; 5/11 knee flexion, prone quad stretch; 5/18 STS with equal weight on right LE 6/14 prone quad stretch; 7/12 thomas stretch, squat, warrior I pose, inf patella mobilization.    Consulted and Agree with Plan of Care Patient             Patient will benefit from skilled therapeutic intervention in order to improve the following deficits and  impairments:  Abnormal gait, Decreased endurance, Decreased skin integrity, Pain, Increased edema, Decreased scar mobility, Decreased knowledge of precautions, Decreased balance, Decreased mobility, Difficulty walking, Decreased range of motion, Decreased strength, Decreased activity tolerance  Visit Diagnosis: Muscle weakness (generalized)  Difficulty in walking, not elsewhere classified  Acute pain of right knee     Problem List Patient Active Problem List   Diagnosis Date Noted   S/P total knee arthroplasty, right 01/23/2021   IIH (idiopathic intracranial hypertension) 10/14/2020   Discoloration of skin of finger 09/12/2020   Bilateral hand pain 09/12/2020   Paresthesia of skin 09/12/2020   Chronic migraine without aura without status migrainosus, not intractable 06/22/2020   Iron deficiency anemia 06/20/2019   S/P left TKA 06/06/2019   Status post total left knee replacement 06/06/2019   Special screening for malignant neoplasms, colon 03/28/2019   LLQ abdominal pain 09/26/2018   Derangement of anterior horn of lateral meniscus of left knee    Derangement of posterior horn of medial meniscus of left knee    Chest pain 04/25/2016   Hypokalemia 04/25/2016   Hyperglycemia 04/25/2016   Diverticulitis of colon 07/17/2014   Nausea without vomiting 07/17/2014   Crohn disease ([place]) 07/17/2014   History of arthroscopy of right knee 07/25/2012   Difficulty in walking(719.7) 06/29/2012   Weakness of right leg 06/29/2012   Posterior tibial tendonitis 11/11/2011   PTTD (posterior tibial tendon dysfunction) 11/11/2011   Knee pain, right 11/11/2011   Chest pain 07/16/2011   Primary osteoarthritis of left knee 02/25/2010   PAIN IN JOINT, MULTIPLE SITES 02/25/2010   Essential hypertension 08/13/2009   HIATAL HERNIA 08/13/2009   PALPITATIONS 08/13/2009   DYSPNEA 08/13/2009   6:00 PM, 04/29/21 M. Sherlyn Lees, PT, DPT Physical Therapist- [place] Office Number:  [phone]   [facility] [address] [place], [place], [address] Phone: [phone]   Fax:  [phone]  Name: SHEALYN SEAN MRN: [id number] Date of Birth: [birth year]

## 2021-05-06 ENCOUNTER — Other Ambulatory Visit: Payer: Self-pay

## 2021-05-06 ENCOUNTER — Encounter (HOSPITAL_COMMUNITY): Payer: Self-pay

## 2021-05-06 ENCOUNTER — Ambulatory Visit (HOSPITAL_COMMUNITY): Payer: 59 | Attending: Student

## 2021-05-06 DIAGNOSIS — M6281 Muscle weakness (generalized): Secondary | ICD-10-CM | POA: Diagnosis not present

## 2021-05-06 DIAGNOSIS — R262 Difficulty in walking, not elsewhere classified: Secondary | ICD-10-CM | POA: Diagnosis not present

## 2021-05-06 DIAGNOSIS — M25561 Pain in right knee: Secondary | ICD-10-CM | POA: Diagnosis not present

## 2021-05-06 NOTE — Therapy (Signed)
[facility] [address] [place], [place], [address] Phone: [phone]   Fax:  [phone]  Physical Therapy Treatment and Progress Note  Patient Details  Name: [name] MRN: [id number] Date of Birth: Sep 22, 1966 Referring Provider (PT): Paralee Cancel, MD  Progress Note Reporting Period 03/14/21 to 05/06/21  See note below for Objective Data and Assessment of Progress/Goals.     Encounter Date: 05/06/2021   PT End of Session - 05/06/21 1702     Visit Number 25    Number of Visits 27    Date for PT Re-Evaluation 05/20/21    Authorization Type [employer] - no auth or VL but review at the 25th visit    Authorization - Visit Number 25    Authorization - Number of Visits 25    Progress Note Due on Visit 35    PT Start Time 6606    PT Stop Time 1732    PT Time Calculation (min) 45 min    Activity Tolerance Patient tolerated treatment well;Patient limited by pain;No increased pain    Behavior During Therapy [facility] for tasks assessed/performed             Past Medical History:  Diagnosis Date   Anxiety    Arthritis    Asthma    Cervical hyperplasia    hisotry of   Chest pain    cath 2005 norm cors, repeat cath Feb 2011 normal cors and only minimally  elevated pulmonary pressures   Crohn disease ([place])    Diverticulitis of colon 07/17/2014   Diverticulosis    Dyspnea    GERD (gastroesophageal reflux disease)    Hiatal hernia    History of iron deficiency anemia    Hypertension    Idiopathic intracranial hypertension    Per patient   Migraines    Obesity    Palpitations    Pneumonia 2008   14 days hospitalization, bilateral   PONV (postoperative nausea and vomiting)    Sleep apnea sleep study 11/03/2009    cpap; does sometimes, doesnt use every night. weight loss no longer using CPAP   Vitamin B deficiency     Past Surgical History:  Procedure Laterality Date   ABDOMINAL HYSTERECTOMY     ANKLE ARTHROSCOPY   06/01/2012   Procedure: ANKLE ARTHROSCOPY;  Surgeon: Colin Rhein, MD;  Location: [place];  Service: Orthopedics;  Laterality: Left;  left ankle arthorsocpy with extensive debridement and gastroc slide   ANKLE SURGERY Left 05/11/2018   BIOPSY  04/26/2019   Procedure: BIOPSY;  Surgeon: Rogene Houston, MD;  [facility];  Service: Endoscopy;;  ileum colon   CARDIAC CATHETERIZATION  11/22/2009 and 2005   WNL   CESAREAN SECTION     CHOLECYSTECTOMY  09/08/2006   lap. chole.   CHONDROPLASTY  06/17/2012   Procedure: CHONDROPLASTY;  Surgeon: Carole Civil, MD;  [facility];  Service: Orthopedics;  Laterality: Right;  right patella   COLONOSCOPY N/A 04/26/2019   Procedure: COLONOSCOPY;  Surgeon: Rogene Houston, MD;  [facility];  Service: Endoscopy;  Laterality: N/A;  100   ESOPHAGOGASTRODUODENOSCOPY N/A 05/14/2015   Procedure: ESOPHAGOGASTRODUODENOSCOPY (EGD);  Surgeon: Rogene Houston, MD;  [facility];  Service: Endoscopy;  Laterality: N/A;  [place] N/A 05/24/2019   Procedure: GIVENS CAPSULE STUDY;  Surgeon: Rogene Houston, MD;  [facility];  Service: Endoscopy;  Laterality: N/A;  7:30   INGUINAL HERNIA REPAIR  10/30/2008   right   KNEE ARTHROSCOPY Right 2013   KNEE ARTHROSCOPY WITH LATERAL MENISECTOMY Left 12/23/2016   Procedure: LEFT KNEE ARTHROSCOPY WITH LATERAL MENISECTOMY;  Surgeon: Carole Civil, MD;  [facility];  Service: Orthopedics;  Laterality: Left;   KNEE ARTHROSCOPY WITH MEDIAL MENISECTOMY Left 12/23/2016   Procedure: LEFT KNEE ARTHROSCOPY WITH MEDIAL MENISECTOMY CHONDROPLASTY PATELLA  AND MEDIAL FEMORAL CONDYLE LEFT KNEE;  Surgeon: Carole Civil, MD;  [facility];  Service: Orthopedics;  Laterality: Left;   SHOULDER ARTHROSCOPY WITH SUBACROMIAL DECOMPRESSION AND BICEP TENDON REPAIR Left 12/21/2017   Procedure: Left shoulder arthroscopic biceps tenodesis, SAD, DCR and labrum  debridement;  Surgeon: Nicholes Stairs, MD;  Location: [facility];  Service: Orthopedics;  Laterality: Left;  120 mins   SHOULDER SURGERY     right x 2    TOTAL KNEE ARTHROPLASTY Left 06/06/2019   Procedure: TOTAL KNEE ARTHROPLASTY;  Surgeon: Paralee Cancel, MD;  [facility];  Service: Orthopedics;  Laterality: Left;  70 mins   TOTAL KNEE ARTHROPLASTY Right 01/23/2021   Procedure: TOTAL KNEE ARTHROPLASTY;  Surgeon: Paralee Cancel, MD;  [facility];  Service: Orthopedics;  Laterality: Right;  70 mins    There were no vitals filed for this visit.   Subjective Assessment - 05/06/21 1700     Subjective Continues to experience pain on anterior right quad and ITB. Notes continued difficulty with ascending/descending stairs requiring step-to pattern    Pertinent History previous knee surgery    Patient Stated Goals to be able to walk without waler    Currently in Pain? Yes    Pain Score 3     Pain Location Knee    Pain Orientation Right    Pain Onset 1 to 4 weeks ago                [facility] PT Assessment - 05/06/21 0001       Assessment   Medical Diagnosis R TKA    Referring Provider (PT) Paralee Cancel, MD      Observation/Other Assessments   Focus on Therapeutic Outcomes (FOTO)  52% function      AROM   Right Knee Extension 0    Right Knee Flexion 108      Strength   Overall Strength Comments able to perform RLE SLR without extensor lag, unable to tolerate increased resistance      Ambulation/Gait   Stairs Yes    Stairs Assistance 6: Modified independent (Device/Increase time)    Stair Management Technique Two rails;Step to pattern    Gait Comments RLE with poor eccentric control in descending, trunk flexion when ascending with RLE to compensate for right quad                           [facility] Adult PT Treatment/Exercise - 05/06/21 0001       Knee/Hip Exercises: Aerobic   Nustep level 4 x 7 min for dynamic warm-up      Knee/Hip Exercises: Machines  for Strengthening   Cybex Knee Flexion 5 plates, 3x10      Knee/Hip Exercises: Standing   Step Down 2 sets;10 reps;Step Height: 2"    Step Down Limitations emphasis on eccentric lowering    Functional Squat 2 sets;10 reps    Functional Squat Limitations performed as eccentric with heels elevated for quad tendon loading  PT Education - 05/06/21 1750     Education Details education on use of eccentric-biased exercises to improve right quad tendon loading    Person(s) Educated Patient    Methods Explanation;Demonstration    Comprehension Verbalized understanding;Returned demonstration              PT Short Term Goals - 02/19/21 1321       PT SHORT TERM GOAL #1   Title Patient will be independent in self management strategies to improve quality of life and functional outcomes.    Time 3    Period Weeks    Status Achieved    Target Date 02/17/21      PT SHORT TERM GOAL #2   Title Patient will report at least 50% improvement in overall symptoms and/or function to demonstrate improved functional mobility    Time 3    Period Weeks    Status Achieved    Target Date 02/17/21      PT SHORT TERM GOAL #3   Title Patient will be able to ambualte at least 226 feet with least restrictive assistive device in 2 minutes to demonstrate improved walking endurance.    Time 3    Period Weeks    Status Achieved    Target Date 02/17/21      PT SHORT TERM GOAL #4   Title Patient will achieve 0-100 degrees in right knee to demonstrate improved knee motion    Time 3    Period Weeks    Status Achieved    Target Date 02/17/21               PT Long Term Goals - 05/06/21 1718       PT LONG TERM GOAL #1   Title Patient will report at least 75% improvement in overall symptoms and/or function to demonstrate improved functional mobility    Baseline 65%    Time 6    Period Weeks    Status On-going      PT LONG TERM GOAL #2   Title Patient will improve  on FOTO score to meet predicted outcomes to demonstrate improved functional mobility.    Baseline 52% function 05/06/21    Time 6    Period Weeks    Status On-going      PT LONG TERM GOAL #3   Title Patient will be able to ambualte at least 226 feet without assistive device in 2 minutes to demonstrate improved walking endurance.    Time 6    Period Weeks    Status Achieved      PT LONG TERM GOAL #4   Title Patient will achieve 0-110 degrees in right knee to demonstrate improved knee motion    Baseline 0-108    Time 6    Period Weeks    Status On-going                   Plan - 05/06/21 1751     Clinical Impression Statement Continues to exhibit pain along right quad tendon superior to patella with increased pain/dysfunction with loading response and demo compensatory movements when negotiating stairs due to weakness/pain and ambulates with moderate antalgic station RLE. Improving strength in RLE evident by ability to perform SLR without extensor lag, but unable to tolerate additional load of RLE due to pain/weakness. Progressing with POC details and would benefit from continued sessions to improve RLE function and minimize pain to improve ROM, strength, and normalized gait pattern  Personal Factors and Comorbidities Comorbidity 1    Comorbidities hx of rigth knee surgery    Examination-Activity Limitations Bathing;Sleep;Bed Mobility;Squat;Stairs;Stand;Transfers;Locomotion Level;Bend;Sit    Examination-Participation Restrictions Cleaning;Community Activity;Driving;Meal Prep;Occupation    Stability/Clinical Decision Making Stable/Uncomplicated    Rehab Potential Good    PT Frequency 1x / week    PT Duration 6 weeks    PT Treatment/Interventions ADLs/Self Care Home Management;Aquatic Therapy;Cryotherapy;Electrical Stimulation;Moist Heat;Traction;Balance training;Therapeutic exercise;Therapeutic activities;Functional mobility training;Stair training;Gait training;DME  Instruction;Neuromuscular re-education;Patient/family education;Manual techniques;Dry needling;Passive range of motion;Joint Manipulations    PT Next Visit Plan f/u with hip flexor pain/nerve pain    PT Home Exercise Plan elevation, compression garments, heel slides 5/4 LAQ, SLR, heel slides; 5/11 knee flexion, prone quad stretch; 5/18 STS with equal weight on right LE 6/14 prone quad stretch; 7/12 thomas stretch, squat, warrior I pose, inf patella mobilization.    Consulted and Agree with Plan of Care Patient             Patient will benefit from skilled therapeutic intervention in order to improve the following deficits and impairments:  Abnormal gait, Decreased endurance, Decreased skin integrity, Pain, Increased edema, Decreased scar mobility, Decreased knowledge of precautions, Decreased balance, Decreased mobility, Difficulty walking, Decreased range of motion, Decreased strength, Decreased activity tolerance  Visit Diagnosis: Muscle weakness (generalized)  Difficulty in walking, not elsewhere classified  Acute pain of right knee     Problem List Patient Active Problem List   Diagnosis Date Noted   S/P total knee arthroplasty, right 01/23/2021   IIH (idiopathic intracranial hypertension) 10/14/2020   Discoloration of skin of finger 09/12/2020   Bilateral hand pain 09/12/2020   Paresthesia of skin 09/12/2020   Chronic migraine without aura without status migrainosus, not intractable 06/22/2020   Iron deficiency anemia 06/20/2019   S/P left TKA 06/06/2019   Status post total left knee replacement 06/06/2019   Special screening for malignant neoplasms, colon 03/28/2019   LLQ abdominal pain 09/26/2018   Derangement of anterior horn of lateral meniscus of left knee    Derangement of posterior horn of medial meniscus of left knee    Chest pain 04/25/2016   Hypokalemia 04/25/2016   Hyperglycemia 04/25/2016   Diverticulitis of colon 07/17/2014   Nausea without vomiting  07/17/2014   Crohn disease ([place]) 07/17/2014   History of arthroscopy of right knee 07/25/2012   Difficulty in walking(719.7) 06/29/2012   Weakness of right leg 06/29/2012   Posterior tibial tendonitis 11/11/2011   PTTD (posterior tibial tendon dysfunction) 11/11/2011   Knee pain, right 11/11/2011   Chest pain 07/16/2011   Primary osteoarthritis of left knee 02/25/2010   PAIN IN JOINT, MULTIPLE SITES 02/25/2010   Essential hypertension 08/13/2009   HIATAL HERNIA 08/13/2009   PALPITATIONS 08/13/2009   DYSPNEA 08/13/2009   5:55 PM, 05/06/21 M. Sherlyn Lees, PT, DPT Physical Therapist- [place] Office Number: [phone]   [facility] [address] [place], [place], [address] Phone: [phone]   Fax:  [phone]  Name: [name] MRN: [id number] Date of Birth: 1966-06-30

## 2021-05-13 ENCOUNTER — Ambulatory Visit (HOSPITAL_COMMUNITY): Payer: 59

## 2021-05-15 ENCOUNTER — Other Ambulatory Visit (HOSPITAL_COMMUNITY): Payer: Self-pay

## 2021-05-15 MED ORDER — LOSARTAN POTASSIUM 100 MG PO TABS
ORAL_TABLET | Freq: Every day | ORAL | 1 refills | Status: DC
  Filled 2021-05-15: qty 90, 90d supply, fill #0
  Filled 2021-08-24: qty 90, 90d supply, fill #1

## 2021-05-16 ENCOUNTER — Other Ambulatory Visit (HOSPITAL_COMMUNITY): Payer: Self-pay

## 2021-05-19 ENCOUNTER — Other Ambulatory Visit (HOSPITAL_COMMUNITY): Payer: Self-pay

## 2021-05-20 ENCOUNTER — Other Ambulatory Visit (HOSPITAL_COMMUNITY): Payer: Self-pay

## 2021-05-21 ENCOUNTER — Other Ambulatory Visit (HOSPITAL_COMMUNITY): Payer: Self-pay

## 2021-05-21 MED ORDER — CYCLOBENZAPRINE HCL 5 MG PO TABS
ORAL_TABLET | ORAL | 0 refills | Status: DC
  Filled 2021-05-21: qty 40, 14d supply, fill #0

## 2021-05-25 ENCOUNTER — Other Ambulatory Visit (HOSPITAL_COMMUNITY): Payer: Self-pay

## 2021-05-26 ENCOUNTER — Other Ambulatory Visit (HOSPITAL_COMMUNITY): Payer: Self-pay

## 2021-05-26 MED ORDER — PANTOPRAZOLE SODIUM 40 MG PO TBEC
40.0000 mg | DELAYED_RELEASE_TABLET | Freq: Two times a day (BID) | ORAL | 2 refills | Status: DC
  Filled 2021-05-26 – 2021-06-07 (×2): qty 90, 45d supply, fill #0
  Filled 2021-08-08: qty 90, 45d supply, fill #1
  Filled 2021-10-09: qty 90, 45d supply, fill #2

## 2021-05-30 DIAGNOSIS — D519 Vitamin B12 deficiency anemia, unspecified: Secondary | ICD-10-CM | POA: Diagnosis not present

## 2021-06-04 ENCOUNTER — Other Ambulatory Visit (HOSPITAL_COMMUNITY): Payer: Self-pay

## 2021-06-06 ENCOUNTER — Other Ambulatory Visit: Payer: Self-pay | Admitting: Neurology

## 2021-06-06 ENCOUNTER — Other Ambulatory Visit (HOSPITAL_COMMUNITY): Payer: Self-pay

## 2021-06-06 DIAGNOSIS — G932 Benign intracranial hypertension: Secondary | ICD-10-CM

## 2021-06-06 MED ORDER — ACETAZOLAMIDE ER 500 MG PO CP12
500.0000 mg | ORAL_CAPSULE | Freq: Three times a day (TID) | ORAL | 3 refills | Status: DC
  Filled 2021-06-06: qty 270, 90d supply, fill #0
  Filled 2021-11-17: qty 270, 90d supply, fill #1
  Filled 2022-03-09: qty 270, 90d supply, fill #2

## 2021-06-07 ENCOUNTER — Other Ambulatory Visit (HOSPITAL_COMMUNITY): Payer: Self-pay

## 2021-06-13 ENCOUNTER — Other Ambulatory Visit (HOSPITAL_COMMUNITY): Payer: Self-pay

## 2021-06-16 ENCOUNTER — Other Ambulatory Visit (HOSPITAL_COMMUNITY): Payer: Self-pay

## 2021-06-20 ENCOUNTER — Other Ambulatory Visit (HOSPITAL_COMMUNITY): Payer: Self-pay

## 2021-06-20 DIAGNOSIS — D519 Vitamin B12 deficiency anemia, unspecified: Secondary | ICD-10-CM | POA: Diagnosis not present

## 2021-06-23 ENCOUNTER — Other Ambulatory Visit (INDEPENDENT_AMBULATORY_CARE_PROVIDER_SITE_OTHER): Payer: Self-pay | Admitting: Gastroenterology

## 2021-06-23 ENCOUNTER — Other Ambulatory Visit (HOSPITAL_COMMUNITY): Payer: Self-pay

## 2021-06-24 ENCOUNTER — Other Ambulatory Visit (HOSPITAL_COMMUNITY): Payer: Self-pay

## 2021-06-24 MED ORDER — FAMOTIDINE 40 MG PO TABS
ORAL_TABLET | ORAL | 0 refills | Status: DC
  Filled 2021-06-24: qty 30, 30d supply, fill #0

## 2021-06-25 ENCOUNTER — Other Ambulatory Visit (HOSPITAL_COMMUNITY): Payer: Self-pay

## 2021-06-26 ENCOUNTER — Other Ambulatory Visit (HOSPITAL_COMMUNITY): Payer: Self-pay

## 2021-06-26 MED ORDER — AMLODIPINE BESYLATE 5 MG PO TABS
ORAL_TABLET | Freq: Every day | ORAL | 2 refills | Status: DC
  Filled 2021-06-26: qty 90, 90d supply, fill #0
  Filled 2021-10-26: qty 90, 90d supply, fill #1
  Filled 2022-02-16: qty 90, 90d supply, fill #2

## 2021-07-01 ENCOUNTER — Other Ambulatory Visit: Payer: Self-pay

## 2021-07-01 ENCOUNTER — Other Ambulatory Visit (HOSPITAL_COMMUNITY): Payer: Self-pay

## 2021-07-01 ENCOUNTER — Ambulatory Visit (INDEPENDENT_AMBULATORY_CARE_PROVIDER_SITE_OTHER): Payer: 59

## 2021-07-01 ENCOUNTER — Ambulatory Visit (HOSPITAL_COMMUNITY): Payer: 59

## 2021-07-01 ENCOUNTER — Telehealth: Payer: 59 | Admitting: Nurse Practitioner

## 2021-07-01 ENCOUNTER — Encounter (HOSPITAL_COMMUNITY): Payer: Self-pay | Admitting: Emergency Medicine

## 2021-07-01 ENCOUNTER — Ambulatory Visit (HOSPITAL_COMMUNITY)
Admission: EM | Admit: 2021-07-01 | Discharge: 2021-07-01 | Disposition: A | Discharge status: Home / Self Care | Payer: 59 | Attending: Emergency Medicine | Admitting: Emergency Medicine

## 2021-07-01 DIAGNOSIS — B3731 Acute candidiasis of vulva and vagina: Secondary | ICD-10-CM

## 2021-07-01 DIAGNOSIS — R300 Dysuria: Secondary | ICD-10-CM

## 2021-07-01 DIAGNOSIS — Z0389 Encounter for observation for other suspected diseases and conditions ruled out: Secondary | ICD-10-CM | POA: Diagnosis not present

## 2021-07-01 DIAGNOSIS — R822 Biliuria: Secondary | ICD-10-CM

## 2021-07-01 DIAGNOSIS — R109 Unspecified abdominal pain: Secondary | ICD-10-CM

## 2021-07-01 DIAGNOSIS — R310 Gross hematuria: Secondary | ICD-10-CM | POA: Diagnosis not present

## 2021-07-01 DIAGNOSIS — B373 Candidiasis of vulva and vagina: Secondary | ICD-10-CM | POA: Diagnosis not present

## 2021-07-01 LAB — POCT URINALYSIS DIPSTICK, ED / UC
Glucose, UA: NEGATIVE mg/dL
Leukocytes,Ua: NEGATIVE
Nitrite: NEGATIVE
Protein, ur: 30 mg/dL — AB
Specific Gravity, Urine: >=1.03 (ref 1.005–1.030)
Urobilinogen, UA: 0.2 mg/dL (ref 0.0–1.0)
pH: 5.5 (ref 5.0–8.0)

## 2021-07-01 MED ORDER — KETOROLAC TROMETHAMINE 60 MG/2ML IM SOLN
60.0000 mg | Freq: Once | INTRAMUSCULAR | Status: AC
  Administered 2021-07-01: 60 mg via INTRAMUSCULAR

## 2021-07-01 MED ORDER — KETOROLAC TROMETHAMINE 60 MG/2ML IM SOLN
INTRAMUSCULAR | Status: AC
  Filled 2021-07-01: qty 2

## 2021-07-01 MED ORDER — TAMSULOSIN HCL 0.4 MG PO CAPS
0.4000 mg | ORAL_CAPSULE | Freq: Every day | ORAL | 0 refills | Status: DC
  Filled 2021-07-01: qty 14, 14d supply, fill #0

## 2021-07-01 MED ORDER — FLUCONAZOLE 150 MG PO TABS
ORAL_TABLET | ORAL | 0 refills | Status: DC
  Filled 2021-07-01: qty 2, 3d supply, fill #0

## 2021-07-01 MED ORDER — HYDROCODONE-ACETAMINOPHEN 7.5-325 MG PO TABS
1.0000 | ORAL_TABLET | ORAL | 0 refills | Status: AC | PRN
  Filled 2021-07-01: qty 18, 3d supply, fill #0

## 2021-07-01 MED ORDER — SULFAMETHOXAZOLE-TRIMETHOPRIM 800-160 MG PO TABS
1.0000 | ORAL_TABLET | Freq: Two times a day (BID) | ORAL | 0 refills | Status: AC
  Filled 2021-07-01: qty 14, 7d supply, fill #0

## 2021-07-01 NOTE — Progress Notes (Signed)
Based on what you shared with me, I feel your condition warrants further evaluation and I recommend that you be seen in a face to face visit.  The pain you are experiencing in your back may be from an infection in your kidney. Based on your symptoms of back pain and difficulty passing urine we feel it would be best to be seen in person to assure you receive the best treatment plan. An Urgent care can test your urine, send a culture and assure you are safe for out patient management. I apologize that we cannot help you today, but we are here for any future needs.    NOTE: There will be NO CHARGE for this eVisit   If you are having a true medical emergency please call 911.      For an urgent face to face visit, [facility] has six urgent care centers for your convenience:     [facility] [place] [facility] [place] Get Driving Directions [phone] [place], [place] [address]    [facility]) Get Driving Directions [phone] [place], [place] [address]  [facility] ([place]) Get Driving Directions [phone] [address] [place],  [place]  [address]  [facility] Get Driving Directions [phone] [place], [place], [place] [address]   [facility] Get Driving Directions  [phone] [address] [place], [place] [address]   [facility] [place] Get Driving Directions [phone] [address] [place], [place] [address]  Your MyChart E-visit questionnaire answers were reviewed by a board certified advanced clinical practitioner to complete your personal care plan based on your specific symptoms.  Thank you for using e-Visits.

## 2021-07-01 NOTE — ED Provider Notes (Signed)
[facility]    CSN: [id number] Arrival date & time: 07/01/21  1111      History   Chief Complaint Chief Complaint  Patient presents with   Dysuria   Hematuria   Back Pain    HPI [name] is a 55 y.o. female.   Patient presents with complaint of hematuria, severe and acute onset of left-sided flank pain, burning with urination, pelvic pressure, states symptoms started this morning.  Urine dip today revealed bilirubin, protein, ketones and large amount of blood.  She has never had a kidney stone in the past.  Patient states that last night, she noticed a little bit of blood in her toilet tissue after urinating before she went to bed.  Patient denies history of frequent or recurrent urinary tract infections.  Patient denies fever, aches, chills, nausea, vomiting, diarrhea, urinary incontinence, history of constipation, loss of bowel or bladder function.  The history is provided by the patient.  Dysuria Pain quality:  Sharp, burning and shooting Pain severity:  Severe Onset quality:  Sudden Duration:  18 hours Timing:  Constant Progression:  Worsening Chronicity:  New Recent urinary tract infections: no   Relieved by:  None tried Worsened by:  Nothing Ineffective treatments:  None tried Urinary symptoms: frequent urination, hematuria and hesitancy   Urinary symptoms: no discolored urine, no foul-smelling urine and no bladder incontinence   Associated symptoms: no genital lesions   Risk factors: no hx of pyelonephritis, no hx of urolithiasis, no kidney transplant, not pregnant, no recurrent urinary tract infections, no renal cysts, no renal disease, not sexually active, no sexually transmitted infections, no single kidney and no urinary catheter   Hematuria   Past Medical History:  Diagnosis Date   Anxiety    Arthritis    Asthma    Cervical hyperplasia    hisotry of   Chest pain    cath 2005 norm cors, repeat cath Feb 2011 normal cors and only  minimally  elevated pulmonary pressures   Crohn disease ([place])    Diverticulitis of colon 07/17/2014   Diverticulosis    Dyspnea    GERD (gastroesophageal reflux disease)    Hiatal hernia    History of iron deficiency anemia    Hypertension    Idiopathic intracranial hypertension    Per patient   Migraines    Obesity    Palpitations    Pneumonia 2008   14 days hospitalization, bilateral   PONV (postoperative nausea and vomiting)    Sleep apnea sleep study 11/03/2009    cpap; does sometimes, doesnt use every night. weight loss no longer using CPAP   Vitamin B deficiency     Patient Active Problem List   Diagnosis Date Noted   S/P total knee arthroplasty, right 01/23/2021   IIH (idiopathic intracranial hypertension) 10/14/2020   Discoloration of skin of finger 09/12/2020   Bilateral hand pain 09/12/2020   Paresthesia of skin 09/12/2020   Chronic migraine without aura without status migrainosus, not intractable 06/22/2020   Iron deficiency anemia 06/20/2019   S/P left TKA 06/06/2019   Status post total left knee replacement 06/06/2019   Special screening for malignant neoplasms, colon 03/28/2019   LLQ abdominal pain 09/26/2018   Derangement of anterior horn of lateral meniscus of left knee    Derangement of posterior horn of medial meniscus of left knee    Chest pain 04/25/2016   Hypokalemia 04/25/2016   Hyperglycemia 04/25/2016   Diverticulitis of colon 07/17/2014   Nausea  without vomiting 07/17/2014   Crohn disease ([place]) 07/17/2014   History of arthroscopy of right knee 07/25/2012   Difficulty in walking(719.7) 06/29/2012   Weakness of right leg 06/29/2012   Posterior tibial tendonitis 11/11/2011   PTTD (posterior tibial tendon dysfunction) 11/11/2011   Knee pain, right 11/11/2011   Chest pain 07/16/2011   Primary osteoarthritis of left knee 02/25/2010   PAIN IN JOINT, MULTIPLE SITES 02/25/2010   Essential hypertension 08/13/2009   HIATAL HERNIA 08/13/2009    PALPITATIONS 08/13/2009   DYSPNEA 08/13/2009    Past Surgical History:  Procedure Laterality Date   ABDOMINAL HYSTERECTOMY     ANKLE ARTHROSCOPY  06/01/2012   Procedure: ANKLE ARTHROSCOPY;  Surgeon: Colin Rhein, MD;  Location: [facility];  Service: Orthopedics;  Laterality: Left;  left ankle arthorsocpy with extensive debridement and gastroc slide   ANKLE SURGERY Left 05/11/2018   BIOPSY  04/26/2019   Procedure: BIOPSY;  Surgeon: Rogene Houston, MD;  [facility];  Service: Endoscopy;;  ileum colon   CARDIAC CATHETERIZATION  11/22/2009 and 2005   WNL   CESAREAN SECTION     CHOLECYSTECTOMY  09/08/2006   lap. chole.   CHONDROPLASTY  06/17/2012   Procedure: CHONDROPLASTY;  Surgeon: Carole Civil, MD;  [facility];  Service: Orthopedics;  Laterality: Right;  right patella   COLONOSCOPY N/A 04/26/2019   Procedure: COLONOSCOPY;  Surgeon: Rogene Houston, MD;  [facility];  Service: Endoscopy;  Laterality: N/A;  100   ESOPHAGOGASTRODUODENOSCOPY N/A 05/14/2015   Procedure: ESOPHAGOGASTRODUODENOSCOPY (EGD);  Surgeon: Rogene Houston, MD;  [facility];  Service: Endoscopy;  Laterality: N/A;  [place] N/A 05/24/2019   Procedure: GIVENS CAPSULE STUDY;  Surgeon: Rogene Houston, MD;  [facility];  Service: Endoscopy;  Laterality: N/A;  7:30   INGUINAL HERNIA REPAIR  10/30/2008   right   KNEE ARTHROSCOPY Right 2013   KNEE ARTHROSCOPY WITH LATERAL MENISECTOMY Left 12/23/2016   Procedure: LEFT KNEE ARTHROSCOPY WITH LATERAL MENISECTOMY;  Surgeon: Carole Civil, MD;  [facility];  Service: Orthopedics;  Laterality: Left;   KNEE ARTHROSCOPY WITH MEDIAL MENISECTOMY Left 12/23/2016   Procedure: LEFT KNEE ARTHROSCOPY WITH MEDIAL MENISECTOMY CHONDROPLASTY PATELLA  AND MEDIAL FEMORAL CONDYLE LEFT KNEE;  Surgeon: Carole Civil, MD;  Location[facility];  Service: Orthopedics;  Laterality: Left;   SHOULDER  ARTHROSCOPY WITH SUBACROMIAL DECOMPRESSION AND BICEP TENDON REPAIR Left 12/21/2017   Procedure: Left shoulder arthroscopic biceps tenodesis, SAD, DCR and labrum debridement;  Surgeon: Nicholes Stairs, MD;  Location: [facility];  Service: Orthopedics;  Laterality: Left;  120 mins   SHOULDER SURGERY     right x 2    TOTAL KNEE ARTHROPLASTY Left 06/06/2019   Procedure: TOTAL KNEE ARTHROPLASTY;  Surgeon: Paralee Cancel, MD;  [facility];  Service: Orthopedics;  Laterality: Left;  70 mins   TOTAL KNEE ARTHROPLASTY Right 01/23/2021   Procedure: TOTAL KNEE ARTHROPLASTY;  Surgeon: Paralee Cancel, MD;  [facility];  Service: Orthopedics;  Laterality: Right;  70 mins    OB History   No obstetric history on file.      Home Medications    Prior to Admission medications   Medication Sig Start Date End Date Taking? Authorizing Provider  acetaminophen (TYLENOL) 500 MG tablet Take 2 tablets (1,000 mg total) by mouth every 8 (eight) hours. 06/07/19  Yes Babish, Rodman Key, PA-C  acetaZOLAMIDE ER (DIAMOX) 500  MG capsule Take 1 capsule by mouth 3 times daily. 06/06/21  Yes Melvenia Beam, MD  albuterol (PROVENTIL) (2.5 MG/3ML) 0.083% nebulizer solution Take 2.5 mg by nebulization every 6 (six) hours as needed for shortness of breath.   Yes [provider]  albuterol (PROVENTIL) (2.5 MG/3ML) 0.083% nebulizer solution Inhale 1 vial via nebulizer every 6 hours as needed 03/21/21  Yes   albuterol (VENTOLIN HFA) 108 (90 Base) MCG/ACT inhaler Inhale 1-2 puffs into the lungs every 6 (six) hours as needed for wheezing or shortness of breath.   Yes [provider]  albuterol (VENTOLIN HFA) 108 (90 Base) MCG/ACT inhaler Inhale 1-2 puffs by mouth every 4 hours as needed 03/21/21  Yes   amitriptyline (ELAVIL) 25 MG tablet TAKE 1 TABLET BY MOUTH AT BEDTIME 03/11/21  Yes Lomax, Amy, NP  amLODipine (NORVASC) 5 MG tablet TAKE 1 TABLET BY MOUTH ONCE DAILY 06/26/21 06/26/22 Yes   B Complex Vitamins (VITAMIN B  COMPLEX 100 IJ) Inject 1,000 mg as directed every 30 (thirty) days.   Yes [provider]  celecoxib (CELEBREX) 200 MG capsule Take one capsule by mouth daily.  Start after finishing prednisone 03/13/21  Yes   Cyanocobalamin (VITAMIN B-12 IJ) Inject as directed every 30 (thirty) days.   Yes [provider]  cyclobenzaprine (FLEXERIL) 5 MG tablet Take 1 tablet by mouth every 8 hours as needed for muscle spasm 05/21/21  Yes   diphenhydrAMINE (BENADRYL) 25 MG tablet Take 25 mg by mouth every 6 (six) hours as needed for allergies.   Yes [provider]  EPINEPHrine (EPIPEN) 0.3 mg/0.3 mL SOAJ injection Inject 0.3 mLs (0.3 mg total) into the muscle once. 12/15/13  Yes Linton Flemings, MD  famotidine (PEPCID) 40 MG tablet Take one tablet by mouth at bedtime. Needs office visit. 06/24/21  Yes Harvel Quale, MD  fluconazole (DIFLUCAN) 150 MG tablet Take first tablet on day one, take second tablet on day three. 07/01/21  Yes Lynden Oxford Scales, PA-C  gabapentin (NEURONTIN) 300 MG capsule Take 1 capsule (300 mg total) by mouth at bedtime. 03/11/21  Yes Carole Civil, MD  Galcanezumab-gnlm [facility]) 120 MG/ML SOAJ Inject 120 mg into the skin every 30 (thirty) days. 01/20/21  Yes Melvenia Beam, MD  HYDROcodone-acetaminophen (NORCO) 7.5-325 MG tablet Take 1 tablet by mouth every 4 (four) hours as needed for up to 3 days for moderate pain. 07/01/21 07/04/21 Yes Lynden Oxford Scales, PA-C  loratadine (CLARITIN) 10 MG tablet Take 10 mg by mouth at bedtime.   Yes [provider]  losartan (COZAAR) 100 MG tablet TAKE 1 TABLET BY MOUTH ONCE DAILY 05/15/21 05/15/22 Yes   Magnesium 500 MG CAPS Take 2,000 mg by mouth at bedtime.    Yes [provider]  meclizine (ANTIVERT) 25 MG tablet Take 25 mg by mouth 3 (three) times daily as needed for dizziness.   Yes [provider]  montelukast (SINGULAIR) 10 MG tablet TAKE 1 TABLET BY MOUTH EVERY EVENING 04/28/21  Yes    Multiple Vitamin (MULTIVITAMIN) tablet Take 1 tablet by mouth daily.   Yes [provider]  pantoprazole (PROTONIX) 40 MG tablet TAKE 1 TABLET BY MOUTH 2 TIMES DAILY 05/26/21  Yes   rizatriptan (MAXALT-MLT) 10 MG disintegrating tablet Take 1 tablet (10 mg total) by mouth as needed for migraine. May repeat in 2 hours if needed 03/11/21  Yes Lomax, Amy, NP  sulfamethoxazole-trimethoprim (BACTRIM DS) 800-160 MG tablet Take 1 tablet by mouth 2 (  two) times daily for 7 days. 07/01/21 07/08/21 Yes Lynden Oxford Scales, PA-C  tamsulosin (FLOMAX) 0.4 MG CAPS capsule Take 1 capsule (0.4 mg total) by mouth daily for 14 days. 07/01/21 07/15/21 Yes Lynden Oxford Scales, PA-C  Vitamin D-Vitamin K (K2 PLUS D3 PO) Take 2 capsules by mouth daily.   Yes [provider]  conjugated estrogens (PREMARIN) vaginal cream Premarin 0.625 mg/gram vaginal cream  APPLY 0.5 GRAM VAGINALLY EVERY DAY FOR 3 WEEKS THEN USE EVERY 2 TO 3 DAYS    [provider]  docusate sodium (COLACE) 100 MG capsule Take 1 capsule (100 mg total) by mouth 2 (two) times daily. Patient taking differently: Take 100 mg by mouth 2 (two) times daily as needed. 01/24/21   Irving Copas, PA-C  ondansetron (ZOFRAN) 4 MG tablet take 1 tablet by mouth every 8 hours as needed for nausea 01/28/21     oxyCODONE (OXY IR/ROXICODONE) 5 MG immediate release tablet Take 1-2 tablets by mouth every 6 hours as needed for severe pain 02/06/21     polyethylene glycol (MIRALAX / GLYCOLAX) 17 g packet Take 17 g by mouth 2 (two) times daily. Patient taking differently: Take 17 g by mouth daily as needed for moderate constipation. 06/07/19   Danae Orleans, PA-C  predniSONE (DELTASONE) 5 MG tablet Take 6 tablets by mouth daily for 4 days, then 4 tablets daily for 4 days, then 2 tablets daily for 4 days, then stop. 03/13/21       Family History Family History  Problem Relation Age of Onset   Hypertension Mother    Atrial fibrillation Mother     Arthritis Mother    Lupus Father    COPD Father    Rheum arthritis Father    Sarcoidosis Father    Lupus Sister    Congestive Heart Failure Maternal Grandmother    Migraines Neg Hx    Headache Neg Hx     Social History Social History   Tobacco Use   Smoking status: Never   Smokeless tobacco: Never  Vaping Use   Vaping Use: Never used  Substance Use Topics   Alcohol use: No    Alcohol/week: 0.0 standard drinks   Drug use: Never     Allergies   Iodinated diagnostic agents, Other, Wasp venom, Pollen extract, Adhesive [tape], Hydromorphone hcl, and Omnipaque [iohexol]   Review of Systems Review of Systems  All other systems reviewed and are negative.   Physical Exam Triage Vital Signs ED Triage Vitals [07/01/21 1250]  Enc Vitals Group     BP      Pulse      Resp      Temp      Temp src      SpO2      Weight      Height      Head Circumference      Peak Flow      Pain Score 10     Pain Loc      Pain Edu?      Excl. in [place]?    No data found.  Updated Vital Signs There were no vitals taken for this visit.  Visual Acuity Right Eye Distance:   Left Eye Distance:   Bilateral Distance:    Right Eye Near:   Left Eye Near:    Bilateral Near:     Physical Exam Vitals and nursing note reviewed.  Constitutional:      General: She is in acute distress.  Appearance: Normal appearance. She is obese. She is ill-appearing and toxic-appearing.  HENT:     Head: Normocephalic and atraumatic.  Eyes:     General: No scleral icterus.    Conjunctiva/sclera: Conjunctivae normal.  Cardiovascular:     Rate and Rhythm: Normal rate and regular rhythm.     Pulses: Normal pulses.     Heart sounds: Normal heart sounds.  Pulmonary:     Effort: Pulmonary effort is normal.     Breath sounds: Normal breath sounds.  Abdominal:     General: Abdomen is flat. Bowel sounds are normal. There is no distension.     Palpations: Abdomen is soft.     Tenderness: There is  abdominal tenderness. There is left CVA tenderness and guarding. There is no right CVA tenderness.  Musculoskeletal:        General: Normal range of motion.     Cervical back: Normal range of motion and neck supple.  Skin:    General: Skin is warm and dry.  Neurological:     General: No focal deficit present.     Mental Status: She is alert and oriented to person, place, and time. Mental status is at baseline.  Psychiatric:        Mood and Affect: Mood normal.        Behavior: Behavior normal.     UC Treatments / Results  Labs (all labs ordered are listed, but only abnormal results are displayed) Labs Reviewed  POCT URINALYSIS DIPSTICK, ED / UC - Abnormal; Notable for the following components:      Result Value   Bilirubin Urine SMALL (*)    Ketones, ur TRACE (*)    Hgb urine dipstick LARGE (*)    Protein, ur 30 (*)    All other components within normal limits  URINE CULTURE    EKG   Radiology DG Abd 1 View  Result Date: 07/01/2021 CLINICAL DATA:  Concern for left renal calculus EXAM: ABDOMEN - 1 VIEW COMPARISON:  None. FINDINGS: Visualized bowel gas pattern is unremarkable. Calcifications in the pelvis, likely due to phleboliths. Calcification overlying the left iliac crest correlates with vascular calcifications seen on prior abdominal CT. No radio-opaque calculi seen over the left renal shadow or expected area of the left ureter. No acute osseous abnormality. IMPRESSION: No radiographic evidence of renal calculi. Electronically Signed   By: Yetta Glassman M.D.   On: 07/01/2021 14:04    Procedures Procedures (including critical care time)  Medications Ordered in UC Medications  ketorolac (TORADOL) injection 60 mg (60 mg Intramuscular Given 07/01/21 1339)    Initial Impression / Assessment and Plan / UC Course  I have reviewed the triage vital signs and the nursing notes.  Pertinent labs & imaging results that were available during my care of the patient were  reviewed by me and considered in my medical decision making (see chart for details).  Clinical Course as of 07/01/21 1454  Tue Jul 01, 2021  1352 DG Abd 1 View [LM]    Clinical Course User Index [LM] Lynden Oxford Scales, PA-C    Patient was provided with an injection of ketorolac 60 mg during visit today.  Patient was sent home with prescriptions for Bactrim, Flomax and Diflucan.  X-ray of abdomen did not reveal kidney stone on the left.  Patient was advised that CT scan would be necessary to obtain definitive diagnosis of kidney stone but that based on the results of her urinalysis and her presentation today as  well as my physical exam, it is very likely that she is suffering from a kidney stone at this time.  Patient agreed to begin medication as prescribed, and should she not have any improvement of her symptoms she will reach out to her primary care provider to request a CT of her abdomen.  Patient verbalized understanding and agreement with plan, all questions were addressed during visit today. Final Clinical Impressions(s) / UC Diagnoses   Final diagnoses:  Acute left flank pain  Gross hematuria  Bilirubin in urine  Vaginal candida     Discharge Instructions      You were treated in the office with a pain medication called ketorolac which should give you some short-term relief of your symptoms.  I provided you with a prescription for Norco which you can take 6 times daily as needed for pain for the next 3 days.  I have also provided you with a prescription for an antibiotic Bactrim as well as prescription for Flomax which helps open the urethral passages to help you possibly passed a stone.  As we discussed, please be sure you filter your urine with a filter provided at your visit today every time you void so that you can be certain of whether or not you have passed a stone.  The abdominal x-ray that was performed today did not reveal any kidney stones unfortunately.  As we  discussed, please contact your primary care provider in the next day or 2 if your symptoms have not improved to ask for CT scan of your abdomen.     ED Prescriptions     Medication Sig Dispense Auth. Provider   HYDROcodone-acetaminophen (NORCO) 7.5-325 MG tablet Take 1 tablet by mouth every 4 (four) hours as needed for up to 3 days for moderate pain. 18 tablet Lynden Oxford Scales, PA-C   tamsulosin (FLOMAX) 0.4 MG CAPS capsule Take 1 capsule (0.4 mg total) by mouth daily for 14 days. 14 capsule Lynden Oxford Scales, PA-C   sulfamethoxazole-trimethoprim (BACTRIM DS) 800-160 MG tablet Take 1 tablet by mouth 2 (two) times daily for 7 days. 14 tablet Lynden Oxford Scales, PA-C   fluconazole (DIFLUCAN) 150 MG tablet Take first tablet on day one, take second tablet on day three. 2 tablet Lynden Oxford Scales, PA-C      I have reviewed the PDMP during this encounter.   Lynden Oxford Scales, PA-C 07/01/21 1454

## 2021-07-01 NOTE — Discharge Instructions (Addendum)
You were treated in the office with a pain medication called ketorolac which should give you some short-term relief of your symptoms.  I provided you with a prescription for Norco which you can take 6 times daily as needed for pain for the next 3 days.  I have also provided you with a prescription for an antibiotic Bactrim as well as prescription for Flomax which helps open the urethral passages to help you possibly passed a stone.  As we discussed, please be sure you filter your urine with a filter provided at your visit today every time you void so that you can be certain of whether or not you have passed a stone.  The abdominal x-ray that was performed today did not reveal any kidney stones unfortunately.  As we discussed, please contact your primary care provider in the next day or 2 if your symptoms have not improved to ask for CT scan of your abdomen.

## 2021-07-01 NOTE — ED Triage Notes (Signed)
Pt c/o dysuria, back pain, and hematuria that started this morning

## 2021-07-02 LAB — URINE CULTURE: Culture: <10000 — AB

## 2021-07-09 ENCOUNTER — Other Ambulatory Visit (HOSPITAL_COMMUNITY): Payer: Self-pay

## 2021-07-09 MED ORDER — AMOXICILLIN 500 MG PO CAPS
ORAL_CAPSULE | ORAL | 0 refills | Status: DC
  Filled 2021-07-09: qty 12, 3d supply, fill #0

## 2021-07-10 DIAGNOSIS — E6609 Other obesity due to excess calories: Secondary | ICD-10-CM | POA: Diagnosis not present

## 2021-07-10 DIAGNOSIS — N200 Calculus of kidney: Secondary | ICD-10-CM | POA: Diagnosis not present

## 2021-07-10 DIAGNOSIS — I10 Essential (primary) hypertension: Secondary | ICD-10-CM | POA: Diagnosis not present

## 2021-07-10 DIAGNOSIS — Z6835 Body mass index (BMI) 35.0-35.9, adult: Secondary | ICD-10-CM | POA: Diagnosis not present

## 2021-07-10 DIAGNOSIS — M545 Low back pain, unspecified: Secondary | ICD-10-CM | POA: Diagnosis not present

## 2021-07-10 DIAGNOSIS — M1991 Primary osteoarthritis, unspecified site: Secondary | ICD-10-CM | POA: Diagnosis not present

## 2021-07-14 ENCOUNTER — Other Ambulatory Visit (HOSPITAL_COMMUNITY): Payer: Self-pay | Admitting: Family Medicine

## 2021-07-14 ENCOUNTER — Other Ambulatory Visit (HOSPITAL_COMMUNITY): Payer: Self-pay

## 2021-07-14 ENCOUNTER — Other Ambulatory Visit: Payer: Self-pay | Admitting: Family Medicine

## 2021-07-14 DIAGNOSIS — Z1231 Encounter for screening mammogram for malignant neoplasm of breast: Secondary | ICD-10-CM

## 2021-07-14 DIAGNOSIS — N200 Calculus of kidney: Secondary | ICD-10-CM

## 2021-07-14 MED ORDER — TAMSULOSIN HCL 0.4 MG PO CAPS
0.4000 mg | ORAL_CAPSULE | Freq: Every day | ORAL | 2 refills | Status: DC
  Filled 2021-07-14: qty 30, 30d supply, fill #0
  Filled 2021-08-24: qty 30, 30d supply, fill #1

## 2021-07-17 ENCOUNTER — Ambulatory Visit (HOSPITAL_COMMUNITY): Admission: RE | Admit: 2021-07-17 | Payer: 59 | Source: Ambulatory Visit

## 2021-07-17 ENCOUNTER — Other Ambulatory Visit: Payer: Self-pay | Admitting: Family Medicine

## 2021-07-17 ENCOUNTER — Other Ambulatory Visit (HOSPITAL_COMMUNITY): Payer: Self-pay

## 2021-07-17 DIAGNOSIS — R319 Hematuria, unspecified: Secondary | ICD-10-CM

## 2021-07-18 ENCOUNTER — Ambulatory Visit (HOSPITAL_COMMUNITY)
Admission: RE | Admit: 2021-07-18 | Discharge: 2021-07-18 | Disposition: A | Discharge status: Home / Self Care | Payer: 59 | Source: Ambulatory Visit | Attending: Family Medicine | Admitting: Family Medicine

## 2021-07-18 ENCOUNTER — Encounter (HOSPITAL_COMMUNITY): Payer: Self-pay

## 2021-07-18 ENCOUNTER — Other Ambulatory Visit (HOSPITAL_COMMUNITY): Payer: Self-pay

## 2021-07-18 ENCOUNTER — Ambulatory Visit (HOSPITAL_COMMUNITY): Payer: 59

## 2021-07-18 ENCOUNTER — Other Ambulatory Visit: Payer: Self-pay

## 2021-07-18 DIAGNOSIS — R319 Hematuria, unspecified: Secondary | ICD-10-CM | POA: Insufficient documentation

## 2021-07-18 DIAGNOSIS — N200 Calculus of kidney: Secondary | ICD-10-CM | POA: Diagnosis not present

## 2021-07-18 DIAGNOSIS — R350 Frequency of micturition: Secondary | ICD-10-CM | POA: Diagnosis not present

## 2021-07-18 DIAGNOSIS — K5730 Diverticulosis of large intestine without perforation or abscess without bleeding: Secondary | ICD-10-CM | POA: Diagnosis not present

## 2021-07-18 LAB — POCT I-STAT CREATININE: Creatinine, Ser: 1 mg/dL (ref 0.44–1.00)

## 2021-07-18 MED ORDER — IOHEXOL 300 MG/ML  SOLN
125.0000 mL | Freq: Once | INTRAMUSCULAR | Status: AC | PRN
  Administered 2021-07-18: 125 mL via INTRAVENOUS

## 2021-07-18 MED ORDER — PREDNISONE 10 MG PO TABS
ORAL_TABLET | ORAL | 0 refills | Status: DC
  Filled 2021-07-18: qty 5, 1d supply, fill #0

## 2021-07-18 MED ORDER — CYCLOBENZAPRINE HCL 5 MG PO TABS
ORAL_TABLET | ORAL | 0 refills | Status: DC
  Filled 2021-07-18: qty 40, 14d supply, fill #0

## 2021-07-21 ENCOUNTER — Other Ambulatory Visit (HOSPITAL_COMMUNITY): Payer: Self-pay

## 2021-07-31 ENCOUNTER — Other Ambulatory Visit: Payer: Self-pay

## 2021-07-31 ENCOUNTER — Ambulatory Visit (INDEPENDENT_AMBULATORY_CARE_PROVIDER_SITE_OTHER): Payer: 59 | Admitting: Gastroenterology

## 2021-07-31 ENCOUNTER — Other Ambulatory Visit (HOSPITAL_COMMUNITY): Payer: Self-pay

## 2021-07-31 ENCOUNTER — Encounter (INDEPENDENT_AMBULATORY_CARE_PROVIDER_SITE_OTHER): Payer: Self-pay | Admitting: Gastroenterology

## 2021-07-31 DIAGNOSIS — K5000 Crohn's disease of small intestine without complications: Secondary | ICD-10-CM | POA: Diagnosis not present

## 2021-07-31 MED ORDER — PREDNISONE 5 MG PO TABS
ORAL_TABLET | ORAL | 0 refills | Status: AC
Start: 2021-07-31 — End: 2021-08-30
  Filled 2021-07-31: qty 105, 30d supply, fill #0

## 2021-07-31 NOTE — Progress Notes (Addendum)
[name], M.D. Gastroenterology & Hepatology [facility] [address] [place], [place] [address]  Primary Care Physician: Sharilyn Sites, MD [address] [place] [address]  I will communicate my assessment and recommendations to the referring MD via EMR.  Problems: Terminal ileum Crohn's disease Recurrent diverticulitis  History of Present Illness: [name] is a 55 y.o. female with past medical history of asthma, small bowel Crohn's disease, GERD, hypertension, obesity, diverticulosis and multiple episodes diverticulitis, who presents for follow up of terminal ileum Crohn's disease.  The patient was last seen on 04/12/2020. At that time, the patient was ordered to perform a CT scan stat, which was confirmatory for diverticulitis.  She was given a 7-day course of ciprofloxacin and metronidazole.  She was also advised to follow-up in the clinic 6 weeks later but she did not follow-up since then.  Patient reports that in late September 2022 she had an episode of flank pain and hematuria. She was initially diagnosed with urolithiasis. However she then developed pain in the R flank and underwent a CT of the abdomen and pelvis with IV contrast on 07/18/2021 which showed presence of recurrent circumferential wall thickening and fatty mural stratification of the terminal ileum and cecal base which were concerning for Crohn's disease.  She reports having frequent bloating, with recurrent episodes of diarrhea up to 5-7 times a day, which is watery in nature. She has noticed this has worsened for the last 6 months. She has not noticed fresh blood in stool or black stool. She has also presented pain in her RLQ which she describes is constant.  The patient is very concerned as she has not had improvement of her symptoms during the last few days.  She has had previous courses of Entocort to improve her symptoms but has been off  treatment for multiple months.  The patient denies having any nausea, vomiting, fever, chills, hematochezia, melena, hematemesis, abdominal distention, jaundice, pruritus or weight loss.  Patient is currently having B12 shots monthly as she was found to have deficiency. She takes magnesium.  No EIM  Last EGD: Last Colonoscopy: Capsule from 05/24/2019 Patient was able to swallow given capsule without any difficulty. Study duration 8 hours 23 minutes and 30 seconds. There is complete as capsule reached large bowel. There is prepyloric erythema and edema with few petechiae but no active bleeding noted. Jejunal diverticulum noted; best seen on image at 02:27:39. Careful examination of rest of her small bowel did not reveal any ulcers or erosions.  Past Medical History: Past Medical History:  Diagnosis Date   Anxiety    Arthritis    Asthma    Cervical hyperplasia    hisotry of   Chest pain    cath 2005 norm cors, repeat cath Feb 2011 normal cors and only minimally  elevated pulmonary pressures   Crohn disease ([place])    Diverticulitis of colon 07/17/2014   Diverticulosis    Dyspnea    GERD (gastroesophageal reflux disease)    Hiatal hernia    History of iron deficiency anemia    Hypertension    Idiopathic intracranial hypertension    Per patient   Migraines    Obesity    Palpitations    Pneumonia 2008   14 days hospitalization, bilateral   PONV (postoperative nausea and vomiting)    Sleep apnea sleep study 11/03/2009    cpap; does sometimes, doesnt use every night. weight loss no longer using CPAP  Vitamin B deficiency     Past Surgical History: Past Surgical History:  Procedure Laterality Date   ABDOMINAL HYSTERECTOMY     ANKLE ARTHROSCOPY  06/01/2012   Procedure: ANKLE ARTHROSCOPY;  Surgeon: Colin Rhein, MD;  Location: [place];  Service: Orthopedics;  Laterality: Left;  left ankle arthorsocpy with extensive debridement and gastroc slide   ANKLE  SURGERY Left 05/11/2018   BIOPSY  04/26/2019   Procedure: BIOPSY;  Surgeon: Rogene Houston, MD;  [facility];  Service: Endoscopy;;  ileum colon   CARDIAC CATHETERIZATION  11/22/2009 and 2005   WNL   CESAREAN SECTION     CHOLECYSTECTOMY  09/08/2006   lap. chole.   CHONDROPLASTY  06/17/2012   Procedure: CHONDROPLASTY;  Surgeon: Carole Civil, MD;  [facility];  Service: Orthopedics;  Laterality: Right;  right patella   COLONOSCOPY N/A 04/26/2019   Procedure: COLONOSCOPY;  Surgeon: Rogene Houston, MD;  [facility];  Service: Endoscopy;  Laterality: N/A;  100   ESOPHAGOGASTRODUODENOSCOPY N/A 05/14/2015   Procedure: ESOPHAGOGASTRODUODENOSCOPY (EGD);  Surgeon: Rogene Houston, MD;  [facility];  Service: Endoscopy;  Laterality: N/A;  [place] N/A 05/24/2019   Procedure: GIVENS CAPSULE STUDY;  Surgeon: Rogene Houston, MD;  [facility];  Service: Endoscopy;  Laterality: N/A;  7:30   INGUINAL HERNIA REPAIR  10/30/2008   right   KNEE ARTHROSCOPY Right 2013   KNEE ARTHROSCOPY WITH LATERAL MENISECTOMY Left 12/23/2016   Procedure: LEFT KNEE ARTHROSCOPY WITH LATERAL MENISECTOMY;  Surgeon: Carole Civil, MD;  [facility];  Service: Orthopedics;  Laterality: Left;   KNEE ARTHROSCOPY WITH MEDIAL MENISECTOMY Left 12/23/2016   Procedure: LEFT KNEE ARTHROSCOPY WITH MEDIAL MENISECTOMY CHONDROPLASTY PATELLA  AND MEDIAL FEMORAL CONDYLE LEFT KNEE;  Surgeon: Carole Civil, MD;  Location: [facility];  Service: Orthopedics;  Laterality: Left;   SHOULDER ARTHROSCOPY WITH SUBACROMIAL DECOMPRESSION AND BICEP TENDON REPAIR Left 12/21/2017   Procedure: Left shoulder arthroscopic biceps tenodesis, SAD, DCR and labrum debridement;  Surgeon: Nicholes Stairs, MD;  Location: [place];  Service: Orthopedics;  Laterality: Left;  120 mins   SHOULDER SURGERY     right x 2    TOTAL KNEE ARTHROPLASTY Left 06/06/2019   Procedure: TOTAL KNEE  ARTHROPLASTY;  Surgeon: Paralee Cancel, MD;  Location: [facility];  Service: Orthopedics;  Laterality: Left;  70 mins   TOTAL KNEE ARTHROPLASTY Right 01/23/2021   Procedure: TOTAL KNEE ARTHROPLASTY;  Surgeon: Paralee Cancel, MD;  Location: [facility];  Service: Orthopedics;  Laterality: Right;  70 mins    Family History: Family History  Problem Relation Age of Onset   Hypertension Mother    Atrial fibrillation Mother    Arthritis Mother    Lupus Father    COPD Father    Rheum arthritis Father    Sarcoidosis Father    Lupus Sister    Congestive Heart Failure Maternal Grandmother    Migraines Neg Hx    Headache Neg Hx     Social History: Social History   Tobacco Use  Smoking Status Never  Smokeless Tobacco Never   Social History   Substance and Sexual Activity  Alcohol Use No   Alcohol/week: 0.0 standard drinks   Social History   Substance and Sexual Activity  Drug Use Never    Allergies: Allergies  Allergen Reactions   Iodinated Diagnostic Agents Anaphylaxis and Hives   Other Shortness Of  Breath    Lemon grass   Wasp Venom Anaphylaxis and Shortness Of Breath   Pollen Extract Swelling   Adhesive [Tape] Rash   Hydromorphone Hcl Nausea And Vomiting   Omnipaque [Iohexol] Hives and Nausea Only    Pt. Was premedicated with emergent protocol.    Medications: Current Outpatient Medications  Medication Sig Dispense Refill   acetaminophen (TYLENOL) 500 MG tablet Take 2 tablets (1,000 mg total) by mouth every 8 (eight) hours. 30 tablet 0   acetaZOLAMIDE ER (DIAMOX) 500 MG capsule Take 1 capsule by mouth 3 times daily. 270 capsule 3   albuterol (PROVENTIL) (2.5 MG/3ML) 0.083% nebulizer solution Inhale 1 vial via nebulizer every 6 hours as needed 90 mL 1   albuterol (VENTOLIN HFA) 108 (90 Base) MCG/ACT inhaler Inhale 1-2 puffs by mouth every 4 hours as needed 18 g 2   amitriptyline (ELAVIL) 25 MG tablet TAKE 1 TABLET BY MOUTH AT BEDTIME 90 tablet 3   amLODipine (NORVASC) 5 MG  tablet TAKE 1 TABLET BY MOUTH ONCE DAILY 90 tablet 2   amoxicillin (AMOXIL) 500 MG capsule TAKE 4 CAPSULES 1 HOUR PRIOR TO DENTAL APPOINTMENT. 12 capsule 0   conjugated estrogens (PREMARIN) vaginal cream Premarin 0.625 mg/gram vaginal cream  APPLY 0.5 GRAM VAGINALLY EVERY DAY FOR 3 WEEKS THEN USE EVERY 2 TO 3 DAYS     Cyanocobalamin (VITAMIN B-12 IJ) Inject as directed every 30 (thirty) days.     cyclobenzaprine (FLEXERIL) 5 MG tablet Take 1 tablet by mouth every 8 hours as needed for muscle spasm 40 tablet 0   diphenhydrAMINE (BENADRYL) 25 MG tablet Take 25 mg by mouth every 6 (six) hours as needed for allergies.     docusate sodium (COLACE) 100 MG capsule Take 1 capsule (100 mg total) by mouth 2 (two) times daily. (Patient taking differently: Take 100 mg by mouth 2 (two) times daily as needed.) 10 capsule 0   EPINEPHrine (EPIPEN) 0.3 mg/0.3 mL SOAJ injection Inject 0.3 mLs (0.3 mg total) into the muscle once. 1 Device 0   famotidine (PEPCID) 40 MG tablet Take one tablet by mouth at bedtime. Needs office visit. 30 tablet 0   gabapentin (NEURONTIN) 300 MG capsule Take 1 capsule (300 mg total) by mouth at bedtime. 90 capsule 5   Galcanezumab-gnlm (EMGALITY) 120 MG/ML SOAJ Inject 120 mg into the skin every 30 (thirty) days. 1 mL 11   loratadine (CLARITIN) 10 MG tablet Take 10 mg by mouth at bedtime.     losartan (COZAAR) 100 MG tablet TAKE 1 TABLET BY MOUTH ONCE DAILY 90 tablet 1   Magnesium 500 MG CAPS Take 2,000 mg by mouth at bedtime.      meclizine (ANTIVERT) 25 MG tablet Take 25 mg by mouth 3 (three) times daily as needed for dizziness.     montelukast (SINGULAIR) 10 MG tablet TAKE 1 TABLET BY MOUTH EVERY EVENING 90 tablet 2   ondansetron (ZOFRAN) 4 MG tablet take 1 tablet by mouth every 8 hours as needed for nausea 30 tablet 0   oxyCODONE (OXY IR/ROXICODONE) 5 MG immediate release tablet Take 1-2 tablets by mouth every 6 hours as needed for severe pain 42 tablet 0   pantoprazole (PROTONIX) 40  MG tablet TAKE 1 TABLET BY MOUTH 2 TIMES DAILY 90 tablet 2   polyethylene glycol (MIRALAX / GLYCOLAX) 17 g packet Take 17 g by mouth 2 (two) times daily. (Patient taking differently: Take 17 g by mouth daily as needed for moderate constipation.) 28  packet 0   rizatriptan (MAXALT-MLT) 10 MG disintegrating tablet Take 1 tablet (10 mg total) by mouth as needed for migraine. May repeat in 2 hours if needed 9 tablet 11   tamsulosin (FLOMAX) 0.4 MG CAPS capsule Take 1 capsule (0.4 mg total) by mouth daily. 30 capsule 2   Multiple Vitamin (MULTIVITAMIN) tablet Take 1 tablet by mouth daily. (Patient not taking: Reported on 07/31/2021)     Vitamin D-Vitamin K (K2 PLUS D3 PO) Take 2 capsules by mouth daily. (Patient not taking: Reported on 07/31/2021)     No current facility-administered medications for this visit.    Review of Systems: GENERAL: negative for malaise, night sweats HEENT: No changes in hearing or vision, no nose bleeds or other nasal problems. NECK: Negative for lumps, goiter, pain and significant neck swelling RESPIRATORY: Negative for cough, wheezing CARDIOVASCULAR: Negative for chest pain, leg swelling, palpitations, orthopnea GI: SEE HPI MUSCULOSKELETAL: Negative for joint pain or swelling, back pain, and muscle pain. SKIN: Negative for lesions, rash PSYCH: Negative for sleep disturbance, mood disorder and recent psychosocial stressors. HEMATOLOGY Negative for prolonged bleeding, bruising easily, and swollen nodes. ENDOCRINE: Negative for cold or heat intolerance, polyuria, polydipsia and goiter. NEURO: negative for tremor, gait imbalance, syncope and seizures. The remainder of the review of systems is noncontributory.   Physical Exam: BP 119/75 (BP Location: Left Arm, Patient Position: Sitting, Cuff Size: Large)   Pulse 87   Temp 98.2 F (36.8 C) (Oral)   Ht 5' 11"  (1.803 m)   Wt 250 lb (113.4 kg)   BMI 34.87 kg/m  GENERAL: The patient is AO x3, in no acute  distress. HEENT: Head is normocephalic and atraumatic. EOMI are intact. Mouth is well hydrated and without lesions. NECK: Supple. No masses LUNGS: Clear to auscultation. No presence of rhonchi/wheezing/rales. Adequate chest expansion HEART: RRR, normal s1 and s2. ABDOMEN: tender upon palpation of the lower abdomen, no guarding, no peritoneal signs, and nondistended. BS +. No masses. EXTREMITIES: Without any cyanosis, clubbing, rash, lesions or edema. NEUROLOGIC: AOx3, no focal motor deficit. SKIN: no jaundice, no rashes  Imaging/Labs: as above  I personally reviewed and interpreted the available labs, imaging and endoscopic files.  Impression and Plan: AARINI SLEE is a 55 y.o. female with past medical history of asthma, small bowel Crohn's disease, GERD, hypertension, obesity, diverticulosis and multiple episodes diverticulitis, who presents for follow up of terminal ileum Crohn's disease.  Presenting symptoms with imaging findings that are consistent with moderate to severe active Crohn's disease.  Her previous findings were questionable as she had very superficial erosions without chronic glandular distortion, however she had improved in the past with the use of Entocort.  She has never been on a biologic in the past.  We had an extensive discussion with the patient for close to 40 minutes regarding the options of treatment. Ferne Reus will require treatment with steroid-sparing medications. Discussion was held regarding benefits and side effects of immunomodulators/biologicals (including infections, malignancies such as lymphoma, skin cancer, tolerance to medication), as well as need to control her disease clinically and endoscopically (ideally microscopically as well) to avoid recurrence of her symptomatology.  Patient understood and agreed.  Based on our discussion, we may attempt using Entyvio for management of her Crohn's disease given that her disease is mild to moderate.  We will  obtain labs before starting the authorization process for her biologicals, will check CBC, CMP, iron stores, CRP, magnesium, L39 and folic acid levels.  We will  start her on a prednisone taper for now.  - check CBC, CMP, iron stores, CRP, magnesium, Z99 and folic acid levels, hepatitis B serologies, QuantiFERON - Start prednisone taper - Will start Entyvio authorization process once blood workup is back - RTC 3 months  All questions were answered.      Harvel Quale, MD Gastroenterology and Hepatology [facility]

## 2021-07-31 NOTE — Patient Instructions (Addendum)
Perform blood workup Start prednisone taper Will start Entyvio authorization process once blood workup is back

## 2021-08-01 ENCOUNTER — Ambulatory Visit (HOSPITAL_COMMUNITY): Payer: 59

## 2021-08-01 ENCOUNTER — Ambulatory Visit (HOSPITAL_COMMUNITY)
Admission: RE | Admit: 2021-08-01 | Discharge: 2021-08-01 | Disposition: A | Discharge status: Home / Self Care | Payer: 59 | Source: Ambulatory Visit | Attending: Family Medicine | Admitting: Family Medicine

## 2021-08-01 DIAGNOSIS — Z1231 Encounter for screening mammogram for malignant neoplasm of breast: Secondary | ICD-10-CM | POA: Insufficient documentation

## 2021-08-02 LAB — COMPREHENSIVE METABOLIC PANEL
AG Ratio: 1.5 (calc) (ref 1.0–2.5)
ALT: 12 U/L (ref 6–29)
AST: 12 U/L (ref 10–35)
Albumin: 4.4 g/dL (ref 3.6–5.1)
Alkaline phosphatase (APISO): 68 U/L (ref 37–153)
BUN: 12 mg/dL (ref 7–25)
CO2: 24 mmol/L (ref 20–32)
Calcium: 9.5 mg/dL (ref 8.6–10.4)
Chloride: 109 mmol/L (ref 98–110)
Creat: 1.03 mg/dL (ref 0.50–1.03)
Globulin: 2.9 g/dL (calc) (ref 1.9–3.7)
Glucose, Bld: 94 mg/dL (ref 65–139)
Potassium: 4 mmol/L (ref 3.5–5.3)
Sodium: 141 mmol/L (ref 135–146)
Total Bilirubin: 0.4 mg/dL (ref 0.2–1.2)
Total Protein: 7.3 g/dL (ref 6.1–8.1)

## 2021-08-02 LAB — IRON, TOTAL/TOTAL IRON BINDING CAP
%SAT: 23 % (calc) (ref 16–45)
Iron: 68 ug/dL (ref 45–160)
TIBC: 298 mcg/dL (calc) (ref 250–450)

## 2021-08-02 LAB — QUANTIFERON-TB GOLD PLUS
Mitogen-NIL: >10 IU/mL
NIL: 0.02 IU/mL
QuantiFERON-TB Gold Plus: NEGATIVE
TB1-NIL: 0.02 IU/mL
TB2-NIL: 0 IU/mL

## 2021-08-02 LAB — CBC WITH DIFFERENTIAL/PLATELET
Absolute Monocytes: 390 cells/uL (ref 200–950)
Basophils Absolute: 29 cells/uL (ref 0–200)
Basophils Relative: 0.7 %
Eosinophils Absolute: 119 cells/uL (ref 15–500)
Eosinophils Relative: 2.9 %
HCT: 39.6 % (ref 35.0–45.0)
Hemoglobin: 12.9 g/dL (ref 11.7–15.5)
Lymphs Abs: 943 cells/uL (ref 850–3900)
MCH: 28.4 pg (ref 27.0–33.0)
MCHC: 32.6 g/dL (ref 32.0–36.0)
MCV: 87.2 fL (ref 80.0–100.0)
MPV: 10.4 fL (ref 7.5–12.5)
Monocytes Relative: 9.5 %
Neutro Abs: 2620 cells/uL (ref 1500–7800)
Neutrophils Relative %: 63.9 %
Platelets: 379 10*3/uL (ref 140–400)
RBC: 4.54 10*6/uL (ref 3.80–5.10)
RDW: 14.3 % (ref 11.0–15.0)
Total Lymphocyte: 23 %
WBC: 4.1 10*3/uL (ref 3.8–10.8)

## 2021-08-02 LAB — C-REACTIVE PROTEIN: CRP: 2.8 mg/L (ref <8.0)

## 2021-08-02 LAB — B12 AND FOLATE PANEL
Folate: 7.7 ng/mL
Vitamin B-12: 345 pg/mL (ref 200–1100)

## 2021-08-02 LAB — HEPATITIS B SURFACE ANTIGEN: Hepatitis B Surface Ag: NONREACTIVE

## 2021-08-02 LAB — FERRITIN: Ferritin: 71 ng/mL (ref 16–232)

## 2021-08-02 LAB — MAGNESIUM: Magnesium: 2.3 mg/dL (ref 1.5–2.5)

## 2021-08-04 ENCOUNTER — Other Ambulatory Visit (INDEPENDENT_AMBULATORY_CARE_PROVIDER_SITE_OTHER): Payer: Self-pay | Admitting: Gastroenterology

## 2021-08-04 DIAGNOSIS — K50019 Crohn's disease of small intestine with unspecified complications: Secondary | ICD-10-CM

## 2021-08-04 NOTE — Progress Notes (Signed)
error 

## 2021-08-08 ENCOUNTER — Other Ambulatory Visit (INDEPENDENT_AMBULATORY_CARE_PROVIDER_SITE_OTHER): Payer: Self-pay | Admitting: Gastroenterology

## 2021-08-08 ENCOUNTER — Other Ambulatory Visit (HOSPITAL_COMMUNITY): Payer: Self-pay

## 2021-08-08 MED ORDER — FAMOTIDINE 40 MG PO TABS
ORAL_TABLET | ORAL | 5 refills | Status: DC
  Filled 2021-08-08: qty 30, 30d supply, fill #0
  Filled 2021-09-15: qty 30, 30d supply, fill #1
  Filled 2021-10-26: qty 30, 30d supply, fill #2

## 2021-08-18 ENCOUNTER — Other Ambulatory Visit (HOSPITAL_COMMUNITY): Payer: Self-pay

## 2021-08-19 ENCOUNTER — Other Ambulatory Visit (HOSPITAL_COMMUNITY): Payer: Self-pay

## 2021-08-21 ENCOUNTER — Other Ambulatory Visit (HOSPITAL_COMMUNITY): Payer: Self-pay

## 2021-08-21 DIAGNOSIS — D511 Vitamin B12 deficiency anemia due to selective vitamin B12 malabsorption with proteinuria: Secondary | ICD-10-CM | POA: Diagnosis not present

## 2021-08-25 ENCOUNTER — Other Ambulatory Visit (HOSPITAL_COMMUNITY): Payer: Self-pay

## 2021-08-26 ENCOUNTER — Other Ambulatory Visit (HOSPITAL_COMMUNITY): Payer: Self-pay

## 2021-08-29 ENCOUNTER — Other Ambulatory Visit (HOSPITAL_COMMUNITY): Payer: Self-pay

## 2021-08-30 ENCOUNTER — Other Ambulatory Visit (HOSPITAL_COMMUNITY): Payer: Self-pay

## 2021-09-02 ENCOUNTER — Encounter (INDEPENDENT_AMBULATORY_CARE_PROVIDER_SITE_OTHER): Payer: Self-pay | Admitting: Gastroenterology

## 2021-09-02 ENCOUNTER — Ambulatory Visit (INDEPENDENT_AMBULATORY_CARE_PROVIDER_SITE_OTHER): Payer: 59 | Admitting: Gastroenterology

## 2021-09-02 ENCOUNTER — Other Ambulatory Visit (HOSPITAL_COMMUNITY): Payer: Self-pay

## 2021-09-02 ENCOUNTER — Other Ambulatory Visit: Payer: Self-pay

## 2021-09-02 VITALS — BP 123/83 | HR 80 | Temp 97.6°F | Ht 71.5 in | Wt 249.3 lb

## 2021-09-02 DIAGNOSIS — R197 Diarrhea, unspecified: Secondary | ICD-10-CM

## 2021-09-02 DIAGNOSIS — R1011 Right upper quadrant pain: Secondary | ICD-10-CM | POA: Diagnosis not present

## 2021-09-02 LAB — CBC
HCT: 40.1 % (ref 35.0–45.0)
Hemoglobin: 13 g/dL (ref 11.7–15.5)
MCH: 28.4 pg (ref 27.0–33.0)
MCHC: 32.4 g/dL (ref 32.0–36.0)
MCV: 87.7 fL (ref 80.0–100.0)
MPV: 10.3 fL (ref 7.5–12.5)
Platelets: 390 10*3/uL (ref 140–400)
RBC: 4.57 10*6/uL (ref 3.80–5.10)
RDW: 13.8 % (ref 11.0–15.0)
WBC: 5 10*3/uL (ref 3.8–10.8)

## 2021-09-02 NOTE — Progress Notes (Signed)
Referring Provider: Sharilyn Sites, MD Primary Care Physician:  Sharilyn Sites, MD Primary GI Physician: Dr. Jenetta Downer  Chief Complaint  Patient presents with   Abdominal Pain    Having abdominal pain and bloating, diarrhea that looks like mustard with grains in it. States her abdomen is more swollen on one side more so than the other   HPI:   [name] is a 55 y.o. female with past medical history of asthma, small bowel Crohn's disease, GERD, hypertension, obesity, diverticulosis and multiple episodes diverticulitis.  Patient presenting today for worsening abdominal pain and diarrhea.  Patient last seen 08/04/21. In September 2022 she had an episode of flank pain and hematuria, she was diagnosed with urolithiasis, she developed worsening flank pain and underwent a CT A/P with contrast 07/18/21 which showed presence of recurrent circumferential wall thickening and fatty mural stratification of terminal ileum and cecal base which were concerning for crohn's disease. At her last visit, she was started on prednisone taper for ongoing diarrhea, occurring 5-7x.day and watery in nature, she also reported constant RLQ pain. She had no rectal bleeding at that time. Labs done at that time showed no significant abnormalities or contraindications to starting biologic therapy, CRP was 2.8. Process to start Entyvio (Q 8 weeks with loading dose 0-2-6 weeks) was initiated at that time.  Weyman Rodney was approved 08/26/21 with plans to start induction on Thursday Dec 1st.  Patient reports that over the past 2-3 weeks, abdominal pain and diarrhea has gotten worse/changed in nature. She states that she started noticing worsening abdominal distention, especially on the Right side of her abdomen. She is on her last few days of prednisone taper at this time. She reports that if she does not eat, she does not have diarrhea. If she eats, she may have 3-5 episodes of diarrhea per day. She reports that diarrhea is  yellow, seedy and very mal odorous in nature. She had recent dental work and had to take pre treatment amoxicillin prior to. She has had 4 courses of amoxicillin since the beginning of October, last one was around (10/5, 10/20, 11/3, 11/23). She denies any blood in stools. She also reports a "firmness" in her RUQ and feels that the right side of her abdomen is more distended than the left. She does however, endorse pain on both sides of her abdomen at times. She reports that she has tried to cut out sodas, dairy and other foods that she suspects could be causing worsening of her symptoms, however, there has been no improvement with elimination of possible triggers. She denies fevers or chills, has had some nausea but no episodes of vomiting. She has not had any recent sick contacts.   Last Colonoscopy:04/26/19- A few erosions in the terminal ileum. Biopsied. - Two erosions in the cecum. (Associated with acute inflammation) - Diverticulosis in the sigmoid colon, at the hepatic flexure and in the ascending colon. - External hemorrhoids. Last Endoscopy:05/14/15 Schatzki's ring and small sliding hiatal hernia. Gastroduodenitis. Antral biopsy taken-no h pylori No evidence of erosive esophagitis or peptic ulcer disease. Schatzki's ring was disrupted with four quadrant biopsy  Recommendations:  Plan for colonoscopy 6 months after Entyvio induction  Past Medical History:  Diagnosis Date   Anxiety    Arthritis    Asthma    Cervical hyperplasia    hisotry of   Chest pain    cath 2005 norm cors, repeat cath Feb 2011 normal cors and only minimally  elevated pulmonary pressures   Crohn  disease ([place])    Diverticulitis of colon 07/17/2014   Diverticulosis    Dyspnea    GERD (gastroesophageal reflux disease)    Hiatal hernia    History of iron deficiency anemia    Hypertension    Idiopathic intracranial hypertension    Per patient   Migraines    Obesity    Palpitations    Pneumonia 2008   14 days  hospitalization, bilateral   PONV (postoperative nausea and vomiting)    Sleep apnea sleep study 11/03/2009    cpap; does sometimes, doesnt use every night. weight loss no longer using CPAP   Vitamin B deficiency     Past Surgical History:  Procedure Laterality Date   ABDOMINAL HYSTERECTOMY     ANKLE ARTHROSCOPY  06/01/2012   Procedure: ANKLE ARTHROSCOPY;  Surgeon: Colin Rhein, MD;  Location: [place];  Service: Orthopedics;  Laterality: Left;  left ankle arthorsocpy with extensive debridement and gastroc slide   ANKLE SURGERY Left 05/11/2018   BIOPSY  04/26/2019   Procedure: BIOPSY;  Surgeon: Rogene Houston, MD;  [facility];  Service: Endoscopy;;  ileum colon   CARDIAC CATHETERIZATION  11/22/2009 and 2005   WNL   CESAREAN SECTION     CHOLECYSTECTOMY  09/08/2006   lap. chole.   CHONDROPLASTY  06/17/2012   Procedure: CHONDROPLASTY;  Surgeon: Carole Civil, MD;  [facility];  Service: Orthopedics;  Laterality: Right;  right patella   COLONOSCOPY N/A 04/26/2019   Procedure: COLONOSCOPY;  Surgeon: Rogene Houston, MD;  [facility];  Service: Endoscopy;  Laterality: N/A;  100   ESOPHAGOGASTRODUODENOSCOPY N/A 05/14/2015   Procedure: ESOPHAGOGASTRODUODENOSCOPY (EGD);  Surgeon: Rogene Houston, MD;  [facility];  Service: Endoscopy;  Laterality: N/A;  [place] N/A 05/24/2019   Procedure: GIVENS CAPSULE STUDY;  Surgeon: Rogene Houston, MD;  [facility];  Service: Endoscopy;  Laterality: N/A;  7:30   INGUINAL HERNIA REPAIR  10/30/2008   right   KNEE ARTHROSCOPY Right 2013   KNEE ARTHROSCOPY WITH LATERAL MENISECTOMY Left 12/23/2016   Procedure: LEFT KNEE ARTHROSCOPY WITH LATERAL MENISECTOMY;  Surgeon: Carole Civil, MD;  [facility];  Service: Orthopedics;  Laterality: Left;   KNEE ARTHROSCOPY WITH MEDIAL MENISECTOMY Left 12/23/2016   Procedure: LEFT KNEE ARTHROSCOPY WITH MEDIAL MENISECTOMY  CHONDROPLASTY PATELLA  AND MEDIAL FEMORAL CONDYLE LEFT KNEE;  Surgeon: Carole Civil, MD;  Location: [facility];  Service: Orthopedics;  Laterality: Left;   SHOULDER ARTHROSCOPY WITH SUBACROMIAL DECOMPRESSION AND BICEP TENDON REPAIR Left 12/21/2017   Procedure: Left shoulder arthroscopic biceps tenodesis, SAD, DCR and labrum debridement;  Surgeon: Nicholes Stairs, MD;  Location: [place];  Service: Orthopedics;  Laterality: Left;  120 mins   SHOULDER SURGERY     right x 2    TOTAL KNEE ARTHROPLASTY Left 06/06/2019   Procedure: TOTAL KNEE ARTHROPLASTY;  Surgeon: Paralee Cancel, MD;  Location: [facility];  Service: Orthopedics;  Laterality: Left;  70 mins   TOTAL KNEE ARTHROPLASTY Right 01/23/2021   Procedure: TOTAL KNEE ARTHROPLASTY;  Surgeon: Paralee Cancel, MD;  Location: [facility];  Service: Orthopedics;  Laterality: Right;  70 mins    Current Outpatient Medications  Medication Sig Dispense Refill   acetaminophen (TYLENOL) 500 MG tablet Take 2 tablets (1,000 mg total) by mouth every 8 (eight) hours. 30 tablet 0   acetaZOLAMIDE ER (DIAMOX) 500 MG capsule Take 1 capsule by mouth  3 times daily. 270 capsule 3   albuterol (PROVENTIL) (2.5 MG/3ML) 0.083% nebulizer solution Inhale 1 vial via nebulizer every 6 hours as needed 90 mL 1   albuterol (VENTOLIN HFA) 108 (90 Base) MCG/ACT inhaler Inhale 1-2 puffs by mouth every 4 hours as needed 18 g 2   amitriptyline (ELAVIL) 25 MG tablet TAKE 1 TABLET BY MOUTH AT BEDTIME 90 tablet 3   amLODipine (NORVASC) 5 MG tablet TAKE 1 TABLET BY MOUTH ONCE DAILY 90 tablet 2   amoxicillin (AMOXIL) 500 MG capsule TAKE 4 CAPSULES 1 HOUR PRIOR TO DENTAL APPOINTMENT. 12 capsule 0   conjugated estrogens (PREMARIN) vaginal cream Premarin 0.625 mg/gram vaginal cream  APPLY 0.5 GRAM VAGINALLY EVERY DAY FOR 3 WEEKS THEN USE EVERY 2 TO 3 DAYS     Cyanocobalamin (VITAMIN B-12 IJ) Inject as directed every 30 (thirty) days.     cyclobenzaprine (FLEXERIL) 5 MG tablet Take 1 tablet by mouth  every 8 hours as needed for muscle spasm 40 tablet 0   diphenhydrAMINE (BENADRYL) 25 MG tablet Take 25 mg by mouth every 6 (six) hours as needed for allergies.     docusate sodium (COLACE) 100 MG capsule Take 1 capsule (100 mg total) by mouth 2 (two) times daily. (Patient taking differently: Take 100 mg by mouth 2 (two) times daily as needed.) 10 capsule 0   EPINEPHrine (EPIPEN) 0.3 mg/0.3 mL SOAJ injection Inject 0.3 mLs (0.3 mg total) into the muscle once. 1 Device 0   famotidine (PEPCID) 40 MG tablet Take one tablet by mouth at bedtime. Needs office visit. 30 tablet 5   gabapentin (NEURONTIN) 300 MG capsule Take 1 capsule (300 mg total) by mouth at bedtime. 90 capsule 5   Galcanezumab-gnlm (EMGALITY) 120 MG/ML SOAJ Inject 120 mg into the skin every 30 (thirty) days. 1 mL 11   loratadine (CLARITIN) 10 MG tablet Take 10 mg by mouth at bedtime.     losartan (COZAAR) 100 MG tablet TAKE 1 TABLET BY MOUTH ONCE DAILY 90 tablet 1   Magnesium 500 MG CAPS Take 2,000 mg by mouth at bedtime.      meclizine (ANTIVERT) 25 MG tablet Take 25 mg by mouth 3 (three) times daily as needed for dizziness.     montelukast (SINGULAIR) 10 MG tablet TAKE 1 TABLET BY MOUTH EVERY EVENING 90 tablet 2   Multiple Vitamin (MULTIVITAMIN) tablet Take 1 tablet by mouth daily.     ondansetron (ZOFRAN) 4 MG tablet take 1 tablet by mouth every 8 hours as needed for nausea 30 tablet 0   oxyCODONE (OXY IR/ROXICODONE) 5 MG immediate release tablet Take 1-2 tablets by mouth every 6 hours as needed for severe pain 42 tablet 0   pantoprazole (PROTONIX) 40 MG tablet TAKE 1 TABLET BY MOUTH 2 TIMES DAILY 90 tablet 2   polyethylene glycol (MIRALAX / GLYCOLAX) 17 g packet Take 17 g by mouth 2 (two) times daily. (Patient taking differently: Take 17 g by mouth daily as needed for moderate constipation.) 28 packet 0   rizatriptan (MAXALT-MLT) 10 MG disintegrating tablet Take 1 tablet (10 mg total) by mouth as needed for migraine. May repeat in 2  hours if needed 9 tablet 11   tamsulosin (FLOMAX) 0.4 MG CAPS capsule Take 1 capsule (0.4 mg total) by mouth daily. 30 capsule 2   Vitamin D-Vitamin K (K2 PLUS D3 PO) Take 2 capsules by mouth daily.     No current facility-administered medications for this visit.    Allergies  as of 09/02/2021 - Review Complete 09/02/2021  Allergen Reaction Noted   Iodinated diagnostic agents Anaphylaxis and Hives    Other Shortness Of Breath 05/28/2012   Wasp venom Anaphylaxis and Shortness Of Breath 08/06/2011   Pollen extract Swelling 05/28/2012   Adhesive [tape] Rash 06/03/2012   Hydromorphone hcl Nausea And Vomiting 08/13/2009   Omnipaque [iohexol] Hives and Nausea Only 01/02/2011    Family History  Problem Relation Age of Onset   Hypertension Mother    Atrial fibrillation Mother    Arthritis Mother    Lupus Father    COPD Father    Rheum arthritis Father    Sarcoidosis Father    Lupus Sister    Congestive Heart Failure Maternal Grandmother    Migraines Neg Hx    Headache Neg Hx     Social History   Socioeconomic History   Marital status: Married    Spouse name: Not on file   Number of children: 4   Years of education: Not on file   Highest education level: Not on file  Occupational History    Employer: [employer]    Comment: corporate safety  Tobacco Use   Smoking status: Never   Smokeless tobacco: Never  Vaping Use   Vaping Use: Never used  Substance and Sexual Activity   Alcohol use: No    Alcohol/week: 0.0 standard drinks   Drug use: Never   Sexual activity: Yes    Birth control/protection: Surgical  Other Topics Concern   Not on file  Social History Narrative   Lives with husband and oldest son    Caffeine use: about 3 cups of coffee/day   She works in Chief Operating Officer with [employer]   She has 4 children age 41-22.     Social Determinants of Health   Financial Resource Strain: Not on file  Food Insecurity: Not on file  Transportation Needs: Not on file   Physical Activity: Not on file  Stress: Not on file  Social Connections: Not on file   Review of systems General: negative for malaise, night sweats, fever, chills, weight loss Neck: Negative for lumps, goiter, pain and significant neck swelling Resp: Negative for cough, wheezing, dyspnea at rest CV: Negative for chest pain, leg swelling, palpitations, orthopnea GI: denies melena, hematochezia, vomiting, constipation, dysphagia, odyonophagia, early satiety or unintentional weight loss. +nausea +diarrhea +Right abdominal pain MSK: Negative for joint pain or swelling, back pain, and muscle pain. Derm: Negative for itching or rash Psych: Denies depression, anxiety, memory loss, confusion. No homicidal or suicidal ideation.  Heme: Negative for prolonged bleeding, bruising easily, and swollen nodes. Endocrine: Negative for cold or heat intolerance, polyuria, polydipsia and goiter. Neuro: negative for tremor, gait imbalance, syncope and seizures. The remainder of the review of systems is noncontributory.  Physical Exam: BP 123/83 (BP Location: Left Arm, Patient Position: Sitting, Cuff Size: Large)   Pulse 80   Temp 97.6 F (36.4 C) (Oral)   Ht 5' 11.5" (1.816 m)   Wt 249 lb 4.8 oz (113.1 kg)   BMI 34.29 kg/m  General:   Alert and oriented. No distress noted. Pleasant and cooperative.  Head:  Normocephalic and atraumatic. Eyes:  Conjuctiva clear without scleral icterus. Mouth:  Oral mucosa pink and moist. Good dentition. No lesions. Heart: Normal rate and rhythm, s1 and s2 heart sounds present.  Lungs: Clear lung sounds in all lobes. Respirations equal and unlabored. Abdomen:  +BS, soft, and non-distended. No rebound or guarding. No HSM or  masses noted. Mild TTP of Right abdomen, mid to upper  Derm: No palmar erythema or jaundice Msk:  Symmetrical without gross deformities. Normal posture. Extremities:  Without edema. Neurologic:  Alert and  oriented x4 Psych:  Alert and cooperative.  Normal mood and affect.  Invalid input(s): 6 MONTHS   ASSESSMENT: TUANA HOHEISEL is a 54 y.o. female presenting today for worsening diarrhea and abdominal pain/distention.   Diarrhea worsening, different in nature over the past 2 weeks, now yellow, seedy and very malodorous, patient is almost at the end of her prednisone taper that was started last month for suspected Crohn's flare. She has had multiple short courses of amoxicillin for dental work over the past 2 months, most recent was nov 3 and nov 23, given her symptoms, I am concerned about superimposed infectious etiology. Will check c diff and GI path panel today. Last CRP at the end of Oct was WNL. Abdominal exam is fairly unremarkable, she is without vomiting, is passing stool and gas so very low suspicion for any type of obstruction, however, if stool studies are negative, will consider CT A/P for further evaluation. Patient is to start Entyvio induction on Thursday, will continue with scheduled biologic induction unless CBC/stool studies indicate active infectious process.    PLAN:  C diff and GI Path panel 2. CBC 3. Consider CT A/P if stool studies are negative 4. Plan to continue with induction of Entyvio on Thursday unless labs/stool studies indicate active infection 5. Patient to let me know if she stops passing stool, has vomiting or abdominal pain worsens  Follow Up: Has 3 month follow up with Dr. Darylene Price L. Alver Sorrow, MSN, APRN, AGNP-C Adult-Gerontology Nurse Practitioner [facility]

## 2021-09-02 NOTE — Patient Instructions (Addendum)
We will check stool studies and basic labs for further evaluation of your worsening diarrhea and abdominal pain.  I am going to hold off on CT scan at this time, if your symptoms worsen or you develop new GI symptoms, please let me know. If stool studies are negative, we may need to proceed with CT imagining of your abdomen. I will be in touch regarding results of labs and stool studies.

## 2021-09-03 ENCOUNTER — Other Ambulatory Visit (HOSPITAL_COMMUNITY): Payer: Self-pay

## 2021-09-04 ENCOUNTER — Telehealth: Payer: Self-pay | Admitting: Family Medicine

## 2021-09-04 ENCOUNTER — Other Ambulatory Visit (HOSPITAL_COMMUNITY): Payer: Self-pay

## 2021-09-04 DIAGNOSIS — K5000 Crohn's disease of small intestine without complications: Secondary | ICD-10-CM | POA: Diagnosis not present

## 2021-09-04 NOTE — Telephone Encounter (Addendum)
Spoke with [employer].  I was told that they had received some additional information today and it is in process.  Expect a determination in approximately 48 hours.  The reference number is [id number].

## 2021-09-04 NOTE — Telephone Encounter (Signed)
Pt states she has missed her November dose of Emgality as a result of the Prior Auth for this medication.  Pt is asking for a call.

## 2021-09-04 NOTE — Telephone Encounter (Signed)
I will call [employer]. I did not see a pending PA so I tried to do one on [facility] but then received a message stating a PA was already in process and to call [place].

## 2021-09-05 ENCOUNTER — Other Ambulatory Visit (HOSPITAL_COMMUNITY): Payer: Self-pay

## 2021-09-11 ENCOUNTER — Other Ambulatory Visit (HOSPITAL_COMMUNITY): Payer: Self-pay

## 2021-09-11 MED ORDER — LOSARTAN POTASSIUM 100 MG PO TABS
ORAL_TABLET | Freq: Every day | ORAL | 1 refills | Status: DC
  Filled 2021-09-11: qty 60, 60d supply, fill #0
  Filled 2021-09-11: qty 90, fill #0
  Filled 2022-03-13: qty 60, 60d supply, fill #1
  Filled 2022-05-05: qty 60, 60d supply, fill #2

## 2021-09-12 DIAGNOSIS — D511 Vitamin B12 deficiency anemia due to selective vitamin B12 malabsorption with proteinuria: Secondary | ICD-10-CM | POA: Diagnosis not present

## 2021-09-15 ENCOUNTER — Telehealth (INDEPENDENT_AMBULATORY_CARE_PROVIDER_SITE_OTHER): Payer: Self-pay | Admitting: *Deleted

## 2021-09-15 ENCOUNTER — Emergency Department (HOSPITAL_COMMUNITY): Payer: 59

## 2021-09-15 ENCOUNTER — Encounter (HOSPITAL_COMMUNITY): Payer: Self-pay | Admitting: *Deleted

## 2021-09-15 ENCOUNTER — Emergency Department (HOSPITAL_COMMUNITY)
Admission: EM | Admit: 2021-09-15 | Discharge: 2021-09-16 | Disposition: A | Discharge status: Home / Self Care | Payer: 59 | Attending: Emergency Medicine | Admitting: Emergency Medicine

## 2021-09-15 ENCOUNTER — Other Ambulatory Visit (HOSPITAL_COMMUNITY): Payer: Self-pay

## 2021-09-15 ENCOUNTER — Other Ambulatory Visit: Payer: Self-pay

## 2021-09-15 DIAGNOSIS — Z79899 Other long term (current) drug therapy: Secondary | ICD-10-CM | POA: Diagnosis not present

## 2021-09-15 DIAGNOSIS — K429 Umbilical hernia without obstruction or gangrene: Secondary | ICD-10-CM | POA: Diagnosis not present

## 2021-09-15 DIAGNOSIS — Z9049 Acquired absence of other specified parts of digestive tract: Secondary | ICD-10-CM | POA: Diagnosis not present

## 2021-09-15 DIAGNOSIS — R1011 Right upper quadrant pain: Secondary | ICD-10-CM | POA: Diagnosis not present

## 2021-09-15 DIAGNOSIS — K625 Hemorrhage of anus and rectum: Secondary | ICD-10-CM | POA: Insufficient documentation

## 2021-09-15 DIAGNOSIS — R42 Dizziness and giddiness: Secondary | ICD-10-CM | POA: Diagnosis not present

## 2021-09-15 DIAGNOSIS — N200 Calculus of kidney: Secondary | ICD-10-CM | POA: Diagnosis not present

## 2021-09-15 DIAGNOSIS — R188 Other ascites: Secondary | ICD-10-CM | POA: Diagnosis not present

## 2021-09-15 DIAGNOSIS — Z96653 Presence of artificial knee joint, bilateral: Secondary | ICD-10-CM | POA: Insufficient documentation

## 2021-09-15 DIAGNOSIS — J45909 Unspecified asthma, uncomplicated: Secondary | ICD-10-CM | POA: Insufficient documentation

## 2021-09-15 DIAGNOSIS — R109 Unspecified abdominal pain: Secondary | ICD-10-CM | POA: Diagnosis not present

## 2021-09-15 DIAGNOSIS — I10 Essential (primary) hypertension: Secondary | ICD-10-CM | POA: Insufficient documentation

## 2021-09-15 DIAGNOSIS — R197 Diarrhea, unspecified: Secondary | ICD-10-CM | POA: Diagnosis not present

## 2021-09-15 LAB — CBC
HCT: 41.5 % (ref 36.0–46.0)
Hemoglobin: 12.8 g/dL (ref 12.0–15.0)
MCH: 28.4 pg (ref 26.0–34.0)
MCHC: 30.8 g/dL (ref 30.0–36.0)
MCV: 92 fL (ref 80.0–100.0)
Platelets: 382 10*3/uL (ref 150–400)
RBC: 4.51 MIL/uL (ref 3.87–5.11)
RDW: 14.5 % (ref 11.5–15.5)
WBC: 5.1 10*3/uL (ref 4.0–10.5)
nRBC: 0 % (ref 0.0–0.2)

## 2021-09-15 LAB — COMPREHENSIVE METABOLIC PANEL
ALT: 22 U/L (ref 0–44)
AST: 18 U/L (ref 15–41)
Albumin: 4.1 g/dL (ref 3.5–5.0)
Alkaline Phosphatase: 56 U/L (ref 38–126)
Anion gap: 7 (ref 5–15)
BUN: 14 mg/dL (ref 6–20)
CO2: 22 mmol/L (ref 22–32)
Calcium: 9 mg/dL (ref 8.9–10.3)
Chloride: 111 mmol/L (ref 98–111)
Creatinine, Ser: 1.16 mg/dL — ABNORMAL HIGH (ref 0.44–1.00)
GFR, Estimated: 56 mL/min — ABNORMAL LOW (ref >60)
Glucose, Bld: 106 mg/dL — ABNORMAL HIGH (ref 70–99)
Potassium: 3.4 mmol/L — ABNORMAL LOW (ref 3.5–5.1)
Sodium: 140 mmol/L (ref 135–145)
Total Bilirubin: 0.5 mg/dL (ref 0.3–1.2)
Total Protein: 7.3 g/dL (ref 6.5–8.1)

## 2021-09-15 LAB — TYPE AND SCREEN
ABO/RH(D): O POS
Antibody Screen: NEGATIVE

## 2021-09-15 MED ORDER — CYCLOBENZAPRINE HCL 5 MG PO TABS
ORAL_TABLET | ORAL | 0 refills | Status: DC
  Filled 2021-09-15: qty 40, 14d supply, fill #0

## 2021-09-15 MED ORDER — ONDANSETRON HCL 4 MG/2ML IJ SOLN
4.0000 mg | Freq: Once | INTRAMUSCULAR | Status: AC
  Administered 2021-09-16: 4 mg via INTRAVENOUS
  Filled 2021-09-15: qty 2

## 2021-09-15 MED ORDER — SODIUM CHLORIDE 0.9 % IV BOLUS
1000.0000 mL | Freq: Once | INTRAVENOUS | Status: AC
  Administered 2021-09-15: 1000 mL via INTRAVENOUS

## 2021-09-15 NOTE — Telephone Encounter (Signed)
Seen 09/02/21. Pt states she is about the same as when seen but having more nausea than when seen. Having some sob, fatigue, lightheadness that started about 2-3 weeks ago. Had first dose of entivyo on dec 1st and next dose his Thursday. States her son is [facility]. When collecting the stool sample is the first time she really paid much attention to stool. She said at office visit she stated stool had some seeds but today noticed it looked like coffee grounds. Has been seeing bright red blood in stool off and on for about 2 weeks. Looks like tissue or clots. Not every stool but most of the stools have it. No fever. But had some chills last night she thinks was because she slept under a fan. Stomach is still swollen but thinks it is some better. Has twinges of pain in right lower abdomen off and on and sometimes on left lower side but mostly right side. Not having much of an appetite. Gets full after 2 -3 bites. Feels like food is sitting and not going down. Has had 3-4 stools today with bright red blood.   [phone]

## 2021-09-15 NOTE — ED Provider Notes (Signed)
[facility] EMERGENCY DEPARTMENT Provider Note   CSN: [id number] Arrival date & time: 09/15/21  0263     History Chief Complaint  Patient presents with   Rectal Bleeding    [name] is a 55 y.o. female.  She has a history of Crohn's disease.  She said she has had a Crohn's flare going on since October.  Just finished up a course of steroids.  Follows with Dr. Laural Golden.  Has on and off abdominal pain.  Sees some coffee-ground in her stool along with some blood and sometimes some things that look like seeds.  Has been feeling dizzy at times and fatigued.  She talk to her GI team today and they recommended she come to the emergency department for labs and evaluation.  No vomiting of any blood.  The history is provided by the patient.  Rectal Bleeding Quality:  Mucoid and maroon Amount:  Moderate Duration:  2 months Timing:  Intermittent Chronicity:  Recurrent Context: defecation   Relieved by:  Nothing Worsened by:  Nothing Ineffective treatments:  None tried Associated symptoms: abdominal pain, dizziness and light-headedness   Associated symptoms: no fever, no hematemesis and no recent illness   Risk factors: hx of IBD   Risk factors: no anticoagulant use       Past Medical History:  Diagnosis Date   Anxiety    Arthritis    Asthma    Cervical hyperplasia    hisotry of   Chest pain    cath 2005 norm cors, repeat cath Feb 2011 normal cors and only minimally  elevated pulmonary pressures   Crohn disease ([place])    Diverticulitis of colon 07/17/2014   Diverticulosis    Dyspnea    GERD (gastroesophageal reflux disease)    Hiatal hernia    History of iron deficiency anemia    Hypertension    Idiopathic intracranial hypertension    Per patient   Migraines    Obesity    Palpitations    Pneumonia 2008   14 days hospitalization, bilateral   PONV (postoperative nausea and vomiting)    Sleep apnea sleep study 11/03/2009    cpap; does sometimes, doesnt use every  night. weight loss no longer using CPAP   Vitamin B deficiency     Patient Active Problem List   Diagnosis Date Noted   Crohn's ileitis ([place]) 07/31/2021   S/P total knee arthroplasty, right 01/23/2021   IIH (idiopathic intracranial hypertension) 10/14/2020   Discoloration of skin of finger 09/12/2020   Bilateral hand pain 09/12/2020   Paresthesia of skin 09/12/2020   Chronic migraine without aura without status migrainosus, not intractable 06/22/2020   Iron deficiency anemia 06/20/2019   S/P left TKA 06/06/2019   Status post total left knee replacement 06/06/2019   Special screening for malignant neoplasms, colon 03/28/2019   LLQ abdominal pain 09/26/2018   Derangement of anterior horn of lateral meniscus of left knee    Derangement of posterior horn of medial meniscus of left knee    Chest pain 04/25/2016   Hypokalemia 04/25/2016   Hyperglycemia 04/25/2016   Diverticulitis of colon 07/17/2014   Nausea without vomiting 07/17/2014   Crohn disease ([place]) 07/17/2014   History of arthroscopy of right knee 07/25/2012   Difficulty in walking(719.7) 06/29/2012   Weakness of right leg 06/29/2012   Posterior tibial tendonitis 11/11/2011   PTTD (posterior tibial tendon dysfunction) 11/11/2011   Knee pain, right 11/11/2011   Chest pain 07/16/2011   Primary osteoarthritis of left  knee 02/25/2010   PAIN IN JOINT, MULTIPLE SITES 02/25/2010   Essential hypertension 08/13/2009   HIATAL HERNIA 08/13/2009   PALPITATIONS 08/13/2009   DYSPNEA 08/13/2009    Past Surgical History:  Procedure Laterality Date   ABDOMINAL HYSTERECTOMY     ANKLE ARTHROSCOPY  06/01/2012   Procedure: ANKLE ARTHROSCOPY;  Surgeon: Colin Rhein, MD;  Location: [place];  Service: Orthopedics;  Laterality: Left;  left ankle arthorsocpy with extensive debridement and gastroc slide   ANKLE SURGERY Left 05/11/2018   BIOPSY  04/26/2019   Procedure: BIOPSY;  Surgeon: Rogene Houston, MD;  [facility];  Service: Endoscopy;;  ileum colon   CARDIAC CATHETERIZATION  11/22/2009 and 2005   WNL   CESAREAN SECTION     CHOLECYSTECTOMY  09/08/2006   lap. chole.   CHONDROPLASTY  06/17/2012   Procedure: CHONDROPLASTY;  Surgeon: Carole Civil, MD;  Location: [facility];  Service: Orthopedics;  Laterality: Right;  right patella   COLONOSCOPY N/A 04/26/2019   Rehman: a few erosions in ternimal ileum, two erosions in cecum (assocaited with acute inflammation) diverticulosis in sigmoid hepatic flexure and ascending colon, external hemorrhoids   ESOPHAGOGASTRODUODENOSCOPY N/A 05/14/2015   Procedure: ESOPHAGOGASTRODUODENOSCOPY (EGD);  Surgeon: Rogene Houston, MD;  [facility];  Service: Endoscopy;  Laterality: N/A;  [place] N/A 05/24/2019   Procedure: GIVENS CAPSULE STUDY;  Surgeon: Rogene Houston, MD;  [facility];  Service: Endoscopy;  Laterality: N/A;  7:30   INGUINAL HERNIA REPAIR  10/30/2008   right   KNEE ARTHROSCOPY Right 2013   KNEE ARTHROSCOPY WITH LATERAL MENISECTOMY Left 12/23/2016   Procedure: LEFT KNEE ARTHROSCOPY WITH LATERAL MENISECTOMY;  Surgeon: Carole Civil, MD;  Location: [facility];  Service: Orthopedics;  Laterality: Left;   KNEE ARTHROSCOPY WITH MEDIAL MENISECTOMY Left 12/23/2016   Procedure: LEFT KNEE ARTHROSCOPY WITH MEDIAL MENISECTOMY CHONDROPLASTY PATELLA  AND MEDIAL FEMORAL CONDYLE LEFT KNEE;  Surgeon: Carole Civil, MD;  Location: [facility];  Service: Orthopedics;  Laterality: Left;   SHOULDER ARTHROSCOPY WITH SUBACROMIAL DECOMPRESSION AND BICEP TENDON REPAIR Left 12/21/2017   Procedure: Left shoulder arthroscopic biceps tenodesis, SAD, DCR and labrum debridement;  Surgeon: Nicholes Stairs, MD;  Location: [place];  Service: Orthopedics;  Laterality: Left;  120 mins   SHOULDER SURGERY     right x 2    TOTAL KNEE ARTHROPLASTY Left 06/06/2019   Procedure: TOTAL KNEE ARTHROPLASTY;  Surgeon: Paralee Cancel, MD;   Location: [facility];  Service: Orthopedics;  Laterality: Left;  70 mins   TOTAL KNEE ARTHROPLASTY Right 01/23/2021   Procedure: TOTAL KNEE ARTHROPLASTY;  Surgeon: Paralee Cancel, MD;  Location: [facility];  Service: Orthopedics;  Laterality: Right;  70 mins     OB History   No obstetric history on file.     Family History  Problem Relation Age of Onset   Hypertension Mother    Atrial fibrillation Mother    Arthritis Mother    Lupus Father    COPD Father    Rheum arthritis Father    Sarcoidosis Father    Lupus Sister    Congestive Heart Failure Maternal Grandmother    Migraines Neg Hx    Headache Neg Hx     Social History   Tobacco Use   Smoking status: Never   Smokeless tobacco: Never  Vaping Use   Vaping Use: Never used  Substance Use Topics   Alcohol use: No  Alcohol/week: 0.0 standard drinks   Drug use: Never    Home Medications Prior to Admission medications   Medication Sig Start Date End Date Taking? Authorizing Provider  acetaminophen (TYLENOL) 500 MG tablet Take 2 tablets (1,000 mg total) by mouth every 8 (eight) hours. 06/07/19  Yes Babish, Rodman Key, PA-C  acetaZOLAMIDE ER (DIAMOX) 500 MG capsule Take 1 capsule by mouth 3 times daily. 06/06/21  Yes Melvenia Beam, MD  albuterol (PROVENTIL) (2.5 MG/3ML) 0.083% nebulizer solution Inhale 1 vial via nebulizer every 6 hours as needed 03/21/21  Yes   albuterol (VENTOLIN HFA) 108 (90 Base) MCG/ACT inhaler Inhale 1-2 puffs by mouth every 4 hours as needed 03/21/21  Yes   amitriptyline (ELAVIL) 25 MG tablet TAKE 1 TABLET BY MOUTH AT BEDTIME 03/11/21  Yes Lomax, Amy, NP  amLODipine (NORVASC) 5 MG tablet TAKE 1 TABLET BY MOUTH ONCE DAILY 06/26/21 06/26/22 Yes   amoxicillin (AMOXIL) 500 MG capsule TAKE 4 CAPSULES 1 HOUR PRIOR TO DENTAL APPOINTMENT. 07/09/21  Yes   Cyanocobalamin (VITAMIN B-12 IJ) Inject as directed every 30 (thirty) days.   Yes [provider]  cyclobenzaprine (FLEXERIL) 5 MG tablet Take 1 tablet by mouth every  8 hours as needed for muscle spasm 09/15/21  Yes   diphenhydrAMINE (BENADRYL) 25 MG tablet Take 25 mg by mouth every 6 (six) hours as needed for allergies.   Yes [provider]  docusate sodium (COLACE) 100 MG capsule Take 1 capsule (100 mg total) by mouth 2 (two) times daily. Patient taking differently: Take 100 mg by mouth 2 (two) times daily as needed. 01/24/21  Yes Irving Copas, PA-C  EPINEPHrine (EPIPEN) 0.3 mg/0.3 mL SOAJ injection Inject 0.3 mLs (0.3 mg total) into the muscle once. 12/15/13  Yes Linton Flemings, MD  famotidine (PEPCID) 40 MG tablet Take one tablet by mouth at bedtime. Needs office visit. 08/08/21  Yes Harvel Quale, MD  gabapentin (NEURONTIN) 300 MG capsule Take 1 capsule (300 mg total) by mouth at bedtime. 03/11/21  Yes Carole Civil, MD  Galcanezumab-gnlm [facility]) 120 MG/ML SOAJ Inject 120 mg into the skin every 30 (thirty) days. 01/20/21  Yes Melvenia Beam, MD  loratadine (CLARITIN) 10 MG tablet Take 10 mg by mouth at bedtime.   Yes [provider]  losartan (COZAAR) 100 MG tablet TAKE 1 TABLET BY MOUTH ONCE DAILY 09/11/21 09/11/22 Yes   Magnesium 500 MG CAPS Take 2,000 mg by mouth at bedtime.    Yes [provider]  meclizine (ANTIVERT) 25 MG tablet Take 25 mg by mouth 3 (three) times daily as needed for dizziness.   Yes [provider]  montelukast (SINGULAIR) 10 MG tablet TAKE 1 TABLET BY MOUTH EVERY EVENING 04/28/21  Yes   ondansetron (ZOFRAN) 4 MG tablet take 1 tablet by mouth every 8 hours as needed for nausea 01/28/21  Yes   pantoprazole (PROTONIX) 40 MG tablet TAKE 1 TABLET BY MOUTH 2 TIMES DAILY 05/26/21  Yes   polyethylene glycol (MIRALAX / GLYCOLAX) 17 g packet Take 17 g by mouth 2 (two) times daily. Patient taking differently: Take 17 g by mouth daily as needed for moderate constipation. 06/07/19  Yes Babish, Rodman Key, PA-C  rizatriptan (MAXALT-MLT) 10 MG disintegrating tablet Take 1 tablet (10 mg total) by mouth  as needed for migraine. May repeat in 2 hours if needed 03/11/21  Yes Lomax, Amy, NP  vedolizumab (ENTYVIO) 300 MG injection Inject 300 mg into the vein every 14 (fourteen) days.  Yes [provider]  conjugated estrogens (PREMARIN) vaginal cream Premarin 0.625 mg/gram vaginal cream  APPLY 0.5 GRAM VAGINALLY EVERY DAY FOR 3 WEEKS THEN USE EVERY 2 TO 3 DAYS Patient not taking: Reported on 09/15/2021    [provider]  Multiple Vitamin (MULTIVITAMIN) tablet Take 1 tablet by mouth daily. Patient not taking: Reported on 09/15/2021    [provider]  oxyCODONE (OXY IR/ROXICODONE) 5 MG immediate release tablet Take 1-2 tablets by mouth every 6 hours as needed for severe pain Patient not taking: Reported on 09/15/2021 02/06/21     tamsulosin (FLOMAX) 0.4 MG CAPS capsule Take 1 capsule (0.4 mg total) by mouth daily. Patient not taking: Reported on 09/15/2021 07/10/21     Vitamin D-Vitamin K (K2 PLUS D3 PO) Take 2 capsules by mouth daily. Patient not taking: Reported on 09/15/2021    [provider]    Allergies    Iodinated diagnostic agents, Other, Wasp venom, Pollen extract, Adhesive [tape], Hydromorphone hcl, and Omnipaque [iohexol]  Review of Systems   Review of Systems  Constitutional:  Positive for fatigue. Negative for fever.  HENT:  Negative for sore throat.   Eyes:  Negative for visual disturbance.  Respiratory:  Negative for shortness of breath.   Cardiovascular:  Negative for chest pain.  Gastrointestinal:  Positive for abdominal pain, blood in stool and hematochezia. Negative for hematemesis.  Genitourinary:  Negative for dysuria.  Musculoskeletal:  Negative for neck pain.  Skin:  Negative for rash.  Neurological:  Positive for dizziness and light-headedness.   Physical Exam Updated Vital Signs BP 111/87   Pulse 75   Temp 98.4 F (36.9 C)   Resp 19   Ht 5' 11.5" (1.816 m)   Wt 112.9 kg   SpO2 99%   BMI 34.24 kg/m   Physical  Exam Vitals and nursing note reviewed.  Constitutional:      General: She is not in acute distress.    Appearance: Normal appearance. She is well-developed.  HENT:     Head: Normocephalic and atraumatic.  Eyes:     Conjunctiva/sclera: Conjunctivae normal.  Cardiovascular:     Rate and Rhythm: Normal rate and regular rhythm.     Heart sounds: No murmur heard. Pulmonary:     Effort: Pulmonary effort is normal. No respiratory distress.     Breath sounds: Normal breath sounds.  Abdominal:     Palpations: Abdomen is soft.     Tenderness: There is no abdominal tenderness. There is no guarding or rebound.  Musculoskeletal:        General: No swelling, deformity or signs of injury. Normal range of motion.     Cervical back: Neck supple.  Skin:    General: Skin is warm and dry.     Capillary Refill: Capillary refill takes less than 2 seconds.  Neurological:     General: No focal deficit present.     Mental Status: She is alert.  Psychiatric:        Mood and Affect: Mood normal.    ED Results / Procedures / Treatments   Labs (all labs ordered are listed, but only abnormal results are displayed) Labs Reviewed  COMPREHENSIVE METABOLIC PANEL - Abnormal; Notable for the following components:      Result Value   Potassium 3.4 (*)    Glucose, Bld 106 (*)    Creatinine, Ser 1.16 (*)    GFR, Estimated 56 (*)    All other components within normal limits  CBC  TYPE AND SCREEN    EKG None  Radiology CT Abdomen Pelvis Wo Contrast  Result Date: 09/15/2021 CLINICAL DATA:  Abdomen pain rectal bleeding EXAM: CT ABDOMEN AND PELVIS WITHOUT CONTRAST TECHNIQUE: Multidetector CT imaging of the abdomen and pelvis was performed following the standard protocol without IV contrast. COMPARISON:  CT 07/18/2021 FINDINGS: Lower chest: Lung bases demonstrate no acute consolidation or effusion. Normal cardiac size. Hepatobiliary: No focal liver abnormality is seen. Status post cholecystectomy. No biliary  dilatation. Pancreas: Unremarkable. No pancreatic ductal dilatation or surrounding inflammatory changes. Spleen: Normal in size without focal abnormality. Adrenals/Urinary Tract: Adrenal glands are normal. Kidneys show no hydronephrosis. Punctate stones in the lower pole left kidney. The bladder is unremarkable Stomach/Bowel: Stomach nonenlarged. No dilated small bowel. Diverticular disease of the left colon without acute wall thickening. Negative appendix. Fluid within the colon without acute wall thickening. Vascular/Lymphatic: Nonaneurysmal aorta.  No suspicious nodes Reproductive: Status post hysterectomy. No adnexal masses. Other: Negative for pelvic effusion or free air. Fat containing umbilical hernia Musculoskeletal: No acute osseous abnormality. IMPRESSION: 1. No CT evidence for acute intra-abdominal or pelvic abnormality. 2. Diverticular disease of left colon without acute wall thickening 3. Punctate nonobstructing left kidney stones Electronically Signed   By: Donavan Foil M.D.   On: 09/15/2021 23:52    Procedures Procedures   Medications Ordered in ED Medications  sodium chloride 0.9 % bolus 1,000 mL (0 mLs Intravenous Stopped 09/16/21 0108)  ondansetron (ZOFRAN) injection 4 mg (4 mg Intravenous Given 09/16/21 0007)    ED Course  I have reviewed the triage vital signs and the nursing notes.  Pertinent labs & imaging results that were available during my care of the patient were reviewed by me and considered in my medical decision making (see chart for details).    MDM Rules/Calculators/A&P                          This patient complains of intermittent rectal bleeding and abdominal pain, dizziness lightheadedness; this involves an extensive number of treatment Options and is a complaint that carries with it a high risk of complications and Morbidity. The differential includes GI bleed during [place], dehydration, symptomatic anemia, Crohn's exacerbation  I ordered, reviewed and  interpreted labs, which included CBC with normal white count normal hemoglobin, chemistries normal other than mildly low potassium elevated creatinine I ordered medication IV fluids and Zofran I ordered imaging studies which included CT abdomen and pelvis with oral contrast and this is pending at time of patient's signout to oncoming provider   Previous records obtained and reviewed in epic including prior GI visits  After the interventions stated above, I reevaluated the patient and found patient to be fairly asymptomatic.  Her care is signed out to Dr. Wyvonnia Dusky to follow-up on results of CT.  Likely can be discharged to follow-up outpatient with GI.   Final Clinical Impression(s) / ED Diagnoses Final diagnoses:  Rectal bleeding    Rx / DC Orders ED Discharge Orders     None        Hayden Rasmussen, MD 09/16/21 1035

## 2021-09-15 NOTE — Telephone Encounter (Signed)
Discussed with pt. - per Vikki Ports - Please advise Ms. Hoelting to proceed to the ER for further evaluation as I am concerned that her symptoms are worsening, she likely has some dehydration secondary to diarrhea and coffee ground stools are concerning for an upper GI bleeding source, she needs further evaluation in the ER with labs and likely IV fluids. Pt verbalized understanding.

## 2021-09-15 NOTE — ED Triage Notes (Signed)
Pt was sent by Dr. Laural Golden for rectal bleeding; pt has seen some bright red bleeding in stool; pt states she has been having a Crohn's flare-up

## 2021-09-16 ENCOUNTER — Telehealth (INDEPENDENT_AMBULATORY_CARE_PROVIDER_SITE_OTHER): Payer: Self-pay | Admitting: *Deleted

## 2021-09-16 ENCOUNTER — Other Ambulatory Visit (HOSPITAL_COMMUNITY): Payer: Self-pay

## 2021-09-16 DIAGNOSIS — J45909 Unspecified asthma, uncomplicated: Secondary | ICD-10-CM | POA: Diagnosis not present

## 2021-09-16 DIAGNOSIS — Z96653 Presence of artificial knee joint, bilateral: Secondary | ICD-10-CM | POA: Diagnosis not present

## 2021-09-16 DIAGNOSIS — K625 Hemorrhage of anus and rectum: Secondary | ICD-10-CM | POA: Diagnosis not present

## 2021-09-16 DIAGNOSIS — Z79899 Other long term (current) drug therapy: Secondary | ICD-10-CM | POA: Diagnosis not present

## 2021-09-16 DIAGNOSIS — R42 Dizziness and giddiness: Secondary | ICD-10-CM | POA: Diagnosis not present

## 2021-09-16 DIAGNOSIS — R109 Unspecified abdominal pain: Secondary | ICD-10-CM | POA: Diagnosis not present

## 2021-09-16 DIAGNOSIS — I10 Essential (primary) hypertension: Secondary | ICD-10-CM | POA: Diagnosis not present

## 2021-09-16 MED ORDER — ONDANSETRON 4 MG PO TBDP
4.0000 mg | ORAL_TABLET | Freq: Three times a day (TID) | ORAL | 0 refills | Status: DC | PRN
Start: 2021-09-16 — End: 2022-04-01
  Filled 2021-09-16: qty 20, 6d supply, fill #0

## 2021-09-16 NOTE — Telephone Encounter (Signed)
Pt left message that she did go to [facility] ED yesterday and was released. States she was told she needs to EGD scheduled.  [phone]

## 2021-09-16 NOTE — ED Notes (Signed)
Pt tolerating po. Ambulatory to lobby with d/c papers. All questions answered.

## 2021-09-16 NOTE — Discharge Instructions (Signed)
No signs of Crohn exacerbation currently.  As we discussed cannot rule out upper GI source of your bleeding completely but doubt at this time.  Your blood work and CT scan look to be stable.  Follow-up with a gastroenterologist to schedule an endoscopy. Return to the ED with worsening symptoms including dizziness, abdominal pain, black tarry stools, or any other concerns.

## 2021-09-16 NOTE — ED Provider Notes (Signed)
CT is negative for acute Crohn's exacerbation.  Does show diverticulosis without evidence of diverticulitis.  Vitals including orthostatics are stable.  Hemoglobin is stable.  Patient reports ongoing bleeding for several weeks. Denies any black or tarry stools.   Declines rectal exam today  Discussed with patient that while we cannot rule out upper GI source completely at this time it is considered less likely.  She denies any melena.  Discussed is reassuring that her vital signs and hemoglobin are stable in the setting of several weeks of bleeding. Patient is comfortable with discharge home and is requesting this.  She does not want to be admitted.  She is reliable for follow-up.  Continue PPI.  Avoid NSAIDs, caffeine, spicy foods, alcohol.  Follow-up with gastroenterology for endoscopies. Return to the ED with worsening symptoms including pain, dark tarry stools, rectal bleeding, vomiting, any other concerns.   Ezequiel Essex, MD 09/16/21 [phone]

## 2021-09-18 DIAGNOSIS — K5000 Crohn's disease of small intestine without complications: Secondary | ICD-10-CM | POA: Diagnosis not present

## 2021-09-19 LAB — GASTROINTESTINAL PATHOGEN PANEL PCR
C. difficile Tox A/B, PCR: NOT DETECTED
Campylobacter, PCR: NOT DETECTED
Cryptosporidium, PCR: NOT DETECTED
E coli (ETEC) LT/ST PCR: NOT DETECTED
E coli (STEC) stx1/stx2, PCR: NOT DETECTED
E coli 0157, PCR: NOT DETECTED
Giardia lamblia, PCR: NOT DETECTED
Norovirus, PCR: NOT DETECTED
Rotavirus A, PCR: NOT DETECTED
Salmonella, PCR: NOT DETECTED
Shigella, PCR: NOT DETECTED

## 2021-09-19 LAB — C. DIFFICILE GDH AND TOXIN A/B
GDH ANTIGEN: NOT DETECTED
MICRO NUMBER:: 12746315
SPECIMEN QUALITY:: ADEQUATE
TOXIN A AND B: NOT DETECTED

## 2021-09-22 ENCOUNTER — Other Ambulatory Visit (HOSPITAL_COMMUNITY): Payer: Self-pay

## 2021-09-23 ENCOUNTER — Encounter (HOSPITAL_COMMUNITY): Payer: Self-pay

## 2021-09-23 NOTE — Therapy (Signed)
[facility] [place], [place], [address] Phone: [phone]   Fax:  [phone]  Patient Details  Name: [name] MRN: [id number] Date of Birth: Mar 21, 1966 Referring Provider:  No ref. provider found  Encounter Date: 09/23/2021  PHYSICAL THERAPY DISCHARGE SUMMARY  Visits from Start of Care: 25  Current functional level related to goals / functional outcomes: Continued deficits and limitations with affected knee   Remaining deficits: Strength and gait deficits   Education / Equipment: HEP   Patient agrees to discharge. Patient goals were partially met. Patient is being discharged due to not returning since the last visit.  Toniann Fail, PT 09/23/2021, 3:43 PM  [facility] [address] [place], [place], [address] Phone: [phone]   Fax:  [phone]

## 2021-09-24 NOTE — Telephone Encounter (Addendum)
Pt states she did not see mychart message and she is about the same and never heard back about results from hospital. States she wanted to get EGD scheduled. She is not sure about the coffee ground stools because she only noticed that when she collected the stool samples. Pt states she is still having loose stools about 3, 4 or sometimes more a day. Has noticed streaks of blood. Abdominal pain is still the same. Had second entivyo dec 15th and next one jan 12th for the third.

## 2021-10-03 NOTE — Telephone Encounter (Signed)
Called patient and discussed. She states she feels really tired and no energy, last 3 days came home from work and just lays around, states she has mucus in stool but has been having that and states she is still having coffee grounds in stools, has some nausea and has occasional sharp pains in abdomen.

## 2021-10-03 NOTE — Telephone Encounter (Signed)
Pt states stools are not dark but has dark coffee grounds/seeds mixed in stool

## 2021-10-07 ENCOUNTER — Other Ambulatory Visit (INDEPENDENT_AMBULATORY_CARE_PROVIDER_SITE_OTHER): Payer: Self-pay | Admitting: Gastroenterology

## 2021-10-07 MED ORDER — PREDNISONE 5 MG PO TABS
ORAL_TABLET | ORAL | 0 refills | Status: AC
Start: 2021-10-07 — End: 2021-11-07
  Filled 2021-10-07: qty 105, 30d supply, fill #0

## 2021-10-07 NOTE — Telephone Encounter (Signed)
Ok, Chelse please send the prednisone taper and if she has black stools in the future we can add on the EGD

## 2021-10-08 ENCOUNTER — Other Ambulatory Visit (HOSPITAL_COMMUNITY): Payer: Self-pay

## 2021-10-08 NOTE — Telephone Encounter (Signed)
Called and discussed with pt. Pt verbalized understanding.

## 2021-10-09 ENCOUNTER — Other Ambulatory Visit (HOSPITAL_COMMUNITY): Payer: Self-pay

## 2021-10-09 MED ORDER — CARESTART COVID-19 HOME TEST VI KIT
PACK | 0 refills | Status: DC
  Filled 2021-10-09: qty 4, 4d supply, fill #0

## 2021-10-10 ENCOUNTER — Other Ambulatory Visit (HOSPITAL_COMMUNITY): Payer: Self-pay

## 2021-10-10 DIAGNOSIS — D511 Vitamin B12 deficiency anemia due to selective vitamin B12 malabsorption with proteinuria: Secondary | ICD-10-CM | POA: Diagnosis not present

## 2021-10-16 DIAGNOSIS — K5000 Crohn's disease of small intestine without complications: Secondary | ICD-10-CM | POA: Diagnosis not present

## 2021-10-26 ENCOUNTER — Other Ambulatory Visit (HOSPITAL_COMMUNITY): Payer: Self-pay

## 2021-10-27 ENCOUNTER — Other Ambulatory Visit (HOSPITAL_COMMUNITY): Payer: Self-pay

## 2021-10-27 MED ORDER — CYCLOBENZAPRINE HCL 5 MG PO TABS
ORAL_TABLET | ORAL | 0 refills | Status: DC
  Filled 2021-10-27: qty 40, 14d supply, fill #0

## 2021-11-11 ENCOUNTER — Ambulatory Visit (INDEPENDENT_AMBULATORY_CARE_PROVIDER_SITE_OTHER): Payer: 59 | Admitting: Internal Medicine

## 2021-11-11 ENCOUNTER — Encounter (INDEPENDENT_AMBULATORY_CARE_PROVIDER_SITE_OTHER): Payer: Self-pay | Admitting: Internal Medicine

## 2021-11-11 ENCOUNTER — Other Ambulatory Visit (HOSPITAL_COMMUNITY): Payer: Self-pay

## 2021-11-11 ENCOUNTER — Other Ambulatory Visit: Payer: Self-pay

## 2021-11-11 VITALS — BP 122/86 | HR 78 | Temp 98.9°F | Ht 71.5 in | Wt 250.9 lb

## 2021-11-11 DIAGNOSIS — K219 Gastro-esophageal reflux disease without esophagitis: Secondary | ICD-10-CM | POA: Diagnosis not present

## 2021-11-11 DIAGNOSIS — K50019 Crohn's disease of small intestine with unspecified complications: Secondary | ICD-10-CM

## 2021-11-11 MED ORDER — PANTOPRAZOLE SODIUM 40 MG PO TBEC
40.0000 mg | DELAYED_RELEASE_TABLET | Freq: Every day | ORAL | 3 refills | Status: DC
  Filled 2021-11-11 – 2022-01-15 (×2): qty 90, 90d supply, fill #0
  Filled 2022-05-05: qty 90, 90d supply, fill #1

## 2021-11-11 MED ORDER — FAMOTIDINE 40 MG PO TABS
ORAL_TABLET | ORAL | 3 refills | Status: DC
  Filled 2021-11-11: qty 90, fill #0
  Filled 2021-12-09: qty 90, 90d supply, fill #0
  Filled 2022-04-27: qty 90, 90d supply, fill #1
  Filled 2022-07-22: qty 90, 90d supply, fill #2
  Filled 2022-10-19: qty 90, 90d supply, fill #3

## 2021-11-11 MED ORDER — LOPERAMIDE HCL 2 MG PO CAPS
2.0000 mg | ORAL_CAPSULE | Freq: Every day | ORAL | Status: DC
Start: 2021-11-11 — End: 2022-03-11

## 2021-11-11 MED ORDER — HYOSCYAMINE SULFATE 0.125 MG SL SUBL
0.1250 mg | SUBLINGUAL_TABLET | Freq: Three times a day (TID) | SUBLINGUAL | 1 refills | Status: DC
  Filled 2021-11-11: qty 90, 30d supply, fill #0
  Filled 2022-04-27: qty 90, 30d supply, fill #1

## 2021-11-11 NOTE — Progress Notes (Signed)
Presenting complaint;  Follow-up for Crohn's ileitis and GERD.  Database and subjective:  Patient is a 56 year old Afro-American female who is here for scheduled visit. She was diagnosed with terminal ileitis back in December 2021 when she had colonoscopy.  Biopsies did not show any granulomas.  She has been treated intermittently with budesonide.  She is also felt to have IBS.  She also has history of diverticulitis for which she was last treated in July 2021. She experiences right flank pain and hematuria last urinary when abdominal pelvic CT.  This study revealed punctated nonobstructive calculi along the inferior pole of left kidney without hydronephrosis.  This study however also showed circumferential wall thickening involving terminal ileum and cecal base along with fatty mural scarification. Patient was seen in the office by Dr. Jenetta Downer.  She was begun on prednisone and studies were ordered with plan to start on vedolizumab.  She also had GI pathogen panel and C. difficile and the studies are negative. She was seen in the emergency room on September 15, 2021 and had another abdominal pelvic CT which did not show any flareup of her Crohn's disease. Patient received first dose of vedolizumab on 09/25/2021 and the second dose on 10/27/2021.  She is scheduled for third dose next month. She says she finished prednisone about 5 weeks ago.  She really could not tell any difference.  Her appetite is up-and-down but she is not losing any weight.  She is having anywhere from 3-10 bowel movements per day.  Stools are watery.  At times there may be reddish tinge.  She remains with post prandial cramping virtually every day.  At times she feels like she has labor pains. Patient states she was given Celebrex last year after she had knee surgery.  She took it for 2 to 4 weeks. She is red meat about 3 times a day. She says she has not taken MiraLAX in over a year.  She is on Diamox for idiopathic intracranial  hypertension. .    Colonoscopy November 2007 for right-sided abdominal pain and weight loss reveals normal terminal ileum and pancolonic diverticulosis. Small bowel given capsule study November 2007 revealed multiple areas of erythema and erosion in proximal and distal small bowel.    Current Medications: Outpatient Encounter Medications as of 11/11/2021  Medication Sig   acetaminophen (TYLENOL) 500 MG tablet Take 2 tablets (1,000 mg total) by mouth every 8 (eight) hours.   acetaZOLAMIDE ER (DIAMOX) 500 MG capsule Take 1 capsule by mouth 3 times daily.   albuterol (PROVENTIL) (2.5 MG/3ML) 0.083% nebulizer solution Inhale 1 vial via nebulizer every 6 hours as needed   albuterol (VENTOLIN HFA) 108 (90 Base) MCG/ACT inhaler Inhale 1-2 puffs by mouth every 4 hours as needed   amitriptyline (ELAVIL) 25 MG tablet TAKE 1 TABLET BY MOUTH AT BEDTIME   amLODipine (NORVASC) 5 MG tablet TAKE 1 TABLET BY MOUTH ONCE DAILY   amoxicillin (AMOXIL) 500 MG capsule TAKE 4 CAPSULES 1 HOUR PRIOR TO DENTAL APPOINTMENT.   conjugated estrogens (PREMARIN) vaginal cream    Cyanocobalamin (VITAMIN B-12 IJ) Inject as directed every 30 (thirty) days.   cyclobenzaprine (FLEXERIL) 5 MG tablet Take 1 tablet by mouth every 8 hours as needed for muscle spasm   diphenhydrAMINE (BENADRYL) 25 MG tablet Take 25 mg by mouth every 6 (six) hours as needed for allergies.   docusate sodium (COLACE) 100 MG capsule Take 1 capsule (100 mg total) by mouth 2 (two) times daily. (Patient taking differently: Take  100 mg by mouth 2 (two) times daily as needed.)   EPINEPHrine (EPIPEN) 0.3 mg/0.3 mL SOAJ injection Inject 0.3 mLs (0.3 mg total) into the muscle once.   famotidine (PEPCID) 40 MG tablet Take one tablet by mouth at bedtime. Needs office visit.   gabapentin (NEURONTIN) 300 MG capsule Take 1 capsule (300 mg total) by mouth at bedtime.   Galcanezumab-gnlm (EMGALITY) 120 MG/ML SOAJ Inject 120 mg into the skin every 30 (thirty) days.    loratadine (CLARITIN) 10 MG tablet Take 10 mg by mouth at bedtime.   losartan (COZAAR) 100 MG tablet TAKE 1 TABLET BY MOUTH ONCE DAILY   meclizine (ANTIVERT) 25 MG tablet Take 25 mg by mouth 3 (three) times daily as needed for dizziness.   montelukast (SINGULAIR) 10 MG tablet TAKE 1 TABLET BY MOUTH EVERY EVENING   ondansetron (ZOFRAN) 4 MG tablet take 1 tablet by mouth every 8 hours as needed for nausea   ondansetron (ZOFRAN-ODT) 4 MG disintegrating tablet Dissolve 1 tablet by mouth every 8 hours as needed for nausea or vomiting.   pantoprazole (PROTONIX) 40 MG tablet TAKE 1 TABLET BY MOUTH 2 TIMES DAILY   polyethylene glycol (MIRALAX / GLYCOLAX) 17 g packet Take 17 g by mouth 2 (two) times daily. (Patient taking differently: Take 17 g by mouth daily as needed for moderate constipation.)   rizatriptan (MAXALT-MLT) 10 MG disintegrating tablet Take 1 tablet (10 mg total) by mouth as needed for migraine. May repeat in 2 hours if needed   tamsulosin (FLOMAX) 0.4 MG CAPS capsule Take 1 capsule (0.4 mg total) by mouth daily.   vedolizumab (ENTYVIO) 300 MG injection Inject 300 mg into the vein every 14 (fourteen) days.   [DISCONTINUED] COVID-19 At Home Antigen Test (CARESTART COVID-19 HOME TEST) KIT Use as directed   [DISCONTINUED] Magnesium 500 MG CAPS Take 2,000 mg by mouth at bedtime.    [DISCONTINUED] Multiple Vitamin (MULTIVITAMIN) tablet Take 1 tablet by mouth daily. (Patient not taking: Reported on 09/15/2021)   [DISCONTINUED] oxyCODONE (OXY IR/ROXICODONE) 5 MG immediate release tablet Take 1-2 tablets by mouth every 6 hours as needed for severe pain (Patient not taking: Reported on 09/15/2021)   [DISCONTINUED] Vitamin D-Vitamin K (K2 PLUS D3 PO) Take 2 capsules by mouth daily. (Patient not taking: Reported on 09/15/2021)   No facility-administered encounter medications on file as of 11/11/2021.    Objective: Blood pressure 122/86, pulse 78, temperature 98.9 F (37.2 C), temperature source Oral,  height 5' 11.5" (1.816 m), weight 250 lb 14.4 oz (113.8 kg). Patient is alert and in no acute distress. Conjunctiva is pink. Sclera is nonicteric Oropharyngeal mucosa is normal. No neck masses or thyromegaly noted. Cardiac exam with regular rhythm normal S1 and S2. No murmur or gallop noted. Lungs are clear to auscultation. Abdomen abdomen is full.  Bowel sounds are normal.  On palpation abdomen is soft.  She has mild tenderness in midepigastrium and right lower quadrant.  No guarding.  No organomegaly or masses. No LE edema or clubbing noted.  Labs/studies Results:   CBC Latest Ref Rng & Units 09/15/2021 09/02/2021 07/31/2021  WBC 4.0 - 10.5 K/uL 5.1 5.0 4.1  Hemoglobin 12.0 - 15.0 g/dL 12.8 13.0 12.9  Hematocrit 36.0 - 46.0 % 41.5 40.1 39.6  Platelets 150 - 400 K/uL 382 390 379    CMP Latest Ref Rng & Units 09/15/2021 07/31/2021 07/18/2021  Glucose 70 - 99 mg/dL 106(H) 94 -  BUN 6 - 20 mg/dL 14 12 -  Creatinine 0.44 - 1.00 mg/dL 1.16(H) 1.03 1.00  Sodium 135 - 145 mmol/L 140 141 -  Potassium 3.5 - 5.1 mmol/L 3.4(L) 4.0 -  Chloride 98 - 111 mmol/L 111 109 -  CO2 22 - 32 mmol/L 22 24 -  Calcium 8.9 - 10.3 mg/dL 9.0 9.5 -  Total Protein 6.5 - 8.1 g/dL 7.3 7.3 -  Total Bilirubin 0.3 - 1.2 mg/dL 0.5 0.4 -  Alkaline Phos 38 - 126 U/L 56 - -  AST 15 - 41 U/L 18 12 -  ALT 0 - 44 U/L 22 12 -    Hepatic Function Latest Ref Rng & Units 09/15/2021 07/31/2021 04/12/2020  Total Protein 6.5 - 8.1 g/dL 7.3 7.3 7.8  Albumin 3.5 - 5.0 g/dL 4.1 - 4.1  AST 15 - 41 U/L 18 12 15   ALT 0 - 44 U/L 22 12 15   Alk Phosphatase 38 - 126 U/L 56 - 53  Total Bilirubin 0.3 - 1.2 mg/dL 0.5 0.4 1.0  Bilirubin, Direct <=0.2 mg/dL - - -    Lab Results  Component Value Date   CRP 2.8 07/31/2021    Data reviewed  CT abdomen and pelvis without contrast from 07/18/2021 Circumferential wall thickening and mural fatty certification of terminal ileum and cecal base. Left-sided colonic diverticulosis  CT  abdomen and pelvis without contrast on 09/15/2021 Left-sided diverticulosis without wall thickening.  Fluid-filled colon There appears to be 1 short segment with wall thickening proximal to the terminal ileum.   Assessment:  #1.  Terminal ileitis initially diagnosed on colonoscopy of December 2011.  She has had mild disease treated with intermittent budesonide.  Colonoscopy in July 2020 once again revealed ileum erosions and biopsy did not show granulomas.  She has responded to budesonide in the past.  Diarrhea worsened.  She was treated with tapering dose of prednisone in fall without symptomatic relief.  Now she is on vedolizumab and has completed induction therapy.  Response may not be seen for few more months. She may also have an element of IBS.  #2.  Chronic GERD.  She has history of small hiatal hernia but no evidence of Barrett's.  Will back off on PPI to single dose and continue H2B at bedtime.  #3.  Patient is on multiple medications and one has to wonder if one or more of these medications making her diarrhea worse.   Plan:  Hemoccult x1. Fecal calprotectin. Proceed with next dose of vedolizumab in March 2023. Loperamide 2 mg p.o. every morning. Hyoscyamine sublingual 0.125 mg before each meal.  Office visit in 2 months.

## 2021-11-11 NOTE — Patient Instructions (Signed)
Physician will call with results of stool test when completed.

## 2021-11-12 ENCOUNTER — Other Ambulatory Visit (HOSPITAL_COMMUNITY): Payer: Self-pay

## 2021-11-13 ENCOUNTER — Other Ambulatory Visit (HOSPITAL_COMMUNITY): Payer: Self-pay

## 2021-11-14 DIAGNOSIS — K50019 Crohn's disease of small intestine with unspecified complications: Secondary | ICD-10-CM | POA: Diagnosis not present

## 2021-11-18 ENCOUNTER — Other Ambulatory Visit (HOSPITAL_COMMUNITY): Payer: Self-pay

## 2021-11-19 ENCOUNTER — Other Ambulatory Visit (HOSPITAL_COMMUNITY): Payer: Self-pay

## 2021-11-19 LAB — CALPROTECTIN: Calprotectin: 50 mcg/g

## 2021-12-03 ENCOUNTER — Other Ambulatory Visit: Payer: Self-pay | Admitting: Family Medicine

## 2021-12-03 ENCOUNTER — Other Ambulatory Visit (HOSPITAL_COMMUNITY): Payer: Self-pay

## 2021-12-03 ENCOUNTER — Other Ambulatory Visit (HOSPITAL_COMMUNITY): Payer: Self-pay | Admitting: Family Medicine

## 2021-12-03 DIAGNOSIS — N202 Calculus of kidney with calculus of ureter: Secondary | ICD-10-CM

## 2021-12-03 DIAGNOSIS — N200 Calculus of kidney: Secondary | ICD-10-CM

## 2021-12-09 ENCOUNTER — Other Ambulatory Visit (HOSPITAL_COMMUNITY): Payer: Self-pay | Admitting: Emergency Medicine

## 2021-12-09 ENCOUNTER — Other Ambulatory Visit (HOSPITAL_COMMUNITY): Payer: Self-pay

## 2021-12-11 ENCOUNTER — Other Ambulatory Visit (HOSPITAL_COMMUNITY): Payer: Self-pay

## 2021-12-11 DIAGNOSIS — K5000 Crohn's disease of small intestine without complications: Secondary | ICD-10-CM | POA: Diagnosis not present

## 2021-12-12 ENCOUNTER — Other Ambulatory Visit (HOSPITAL_COMMUNITY): Payer: Self-pay

## 2021-12-12 ENCOUNTER — Ambulatory Visit (HOSPITAL_COMMUNITY)
Admission: RE | Admit: 2021-12-12 | Discharge: 2021-12-12 | Disposition: A | Discharge status: Home / Self Care | Payer: 59 | Source: Ambulatory Visit | Attending: Family Medicine | Admitting: Family Medicine

## 2021-12-12 ENCOUNTER — Other Ambulatory Visit: Payer: Self-pay

## 2021-12-12 DIAGNOSIS — Z0389 Encounter for observation for other suspected diseases and conditions ruled out: Secondary | ICD-10-CM | POA: Diagnosis not present

## 2021-12-12 DIAGNOSIS — N200 Calculus of kidney: Secondary | ICD-10-CM | POA: Diagnosis not present

## 2021-12-12 DIAGNOSIS — D511 Vitamin B12 deficiency anemia due to selective vitamin B12 malabsorption with proteinuria: Secondary | ICD-10-CM | POA: Diagnosis not present

## 2022-01-07 ENCOUNTER — Other Ambulatory Visit (HOSPITAL_COMMUNITY): Payer: Self-pay

## 2022-01-08 ENCOUNTER — Other Ambulatory Visit (HOSPITAL_COMMUNITY): Payer: Self-pay

## 2022-01-15 ENCOUNTER — Other Ambulatory Visit (HOSPITAL_COMMUNITY): Payer: Self-pay

## 2022-01-16 ENCOUNTER — Other Ambulatory Visit (HOSPITAL_COMMUNITY): Payer: Self-pay

## 2022-01-16 MED ORDER — CYCLOBENZAPRINE HCL 5 MG PO TABS
ORAL_TABLET | ORAL | 0 refills | Status: DC
  Filled 2022-01-16: qty 40, 14d supply, fill #0

## 2022-01-26 ENCOUNTER — Encounter (INDEPENDENT_AMBULATORY_CARE_PROVIDER_SITE_OTHER): Payer: Self-pay | Admitting: Gastroenterology

## 2022-01-26 ENCOUNTER — Ambulatory Visit (INDEPENDENT_AMBULATORY_CARE_PROVIDER_SITE_OTHER): Payer: 59 | Admitting: Gastroenterology

## 2022-01-26 VITALS — BP 119/77 | HR 83 | Temp 98.2°F | Ht 71.5 in | Wt 250.3 lb

## 2022-01-26 DIAGNOSIS — R198 Other specified symptoms and signs involving the digestive system and abdomen: Secondary | ICD-10-CM | POA: Diagnosis not present

## 2022-01-26 DIAGNOSIS — K50019 Crohn's disease of small intestine with unspecified complications: Secondary | ICD-10-CM | POA: Diagnosis not present

## 2022-01-26 NOTE — Patient Instructions (Addendum)
Continue Entyvio every 8 weeks. ?Perform blood workup the day before your sixth dose of Entyvio (around June 28th) ?Schedule EGD and colonoscopy in late June ?Decrease intake of Levsin as needed for abdominal pain ?

## 2022-01-26 NOTE — Progress Notes (Addendum)
[name], M.D. ?Gastroenterology & Hepatology ?[facility] ?[address] ?[place], [place] [address] ? ?Primary Care Physician: ?Sharilyn Sites, MD ?[address] ?[place] [address] ? ?I will communicate my assessment and recommendations to the referring MD via EMR. ? ?Problems: ?Small bowel Crohn's disease ? ?History of Present Illness: ?[name] is a 56 y.o.  female with past medical history of asthma, small bowel Crohn's disease, GERD, hypertension, obesity, diverticulosis and multiple episodes diverticulitis, coming for follow up of diarrhea, abdominal pain and bloating. ? ?The patient was last seen on 2/72023 by Dr. Laural Golden. At that time, the patient was continuously leading up, loperamide as needed Levsin as needed for abdominal pain. Most recent fecal calprotectin in February 2023 had a normal value of 50. ? ?She received 3 doses of Entyvio in December-January, with last dose in March 9th/2023 (fourth dose). She has felt some worsening of her symptoms since starting the medication as she had more episodes of cramping in the RLQ. She has noticed some improvement in her BM frequency as she has 3 nonbloody Bms per day - which have a string like shape . She states that for the last 2 months she started having significant tenesmus and bloating but could not defecate with easiness. She is taking Levsin 3 times a day and is concerned that the medication may have led to slowing of her bowels. She also reports having intermittent episodes of LLQ pain as well and is concerned about diverticulitis. Has not taken Imodium recently. ? ?Patient has presented recurrent nausea and has been eating less than usual as she feels full easily. Denies vomiting. Uses SL Zofran as needed for nausea, possibly 1-2 times a week. ? ?Has noticed some orange stool but is not sure if this is blood. ? ?The patient denies having any nausea, vomiting, fever, chills,  hematochezia, melena, hematemesis, diarrhea, jaundice, pruritus or weight loss. ? ?Last TOI:7124 ?Schatzki's ring and small sliding hiatal hernia. ?Gastroduodenitis. Antral biopsy taken. ?No evidence of erosive esophagitis or peptic ulcer disease. ?Schatzki's ring was disrupted with four quadrant biopsy. ? ?Last Colonoscopy: 04/26/2019 ?showed presence of scattered erosions in the terminal ileum and 2 erosions in the cecum which were biopsied.  Pathology was positive for acute inflammation in the colon and erosive changes in the ileum.  No chronic changes were seen with differential including NSAID injury versus self-limited colitis.  Dr. Laural Golden reviewed the slides with the pathologist who considered that the findings are very mild and was not completely consistent with IBD. ? ?Small bowel endoscopy: ?Patient was able to swallow given capsule without any difficulty. ?Study duration 8 hours 23 minutes and 30 seconds. ?There is complete as capsule reached large bowel. ?There is prepyloric erythema and edema with few petechiae but no active bleeding noted. ?Jejunal diverticulum noted; best seen on image at 02:27:39. ?Careful examination of rest of her small bowel did not reveal any ulcers or erosions. ? ?Past Medical History: ?Past Medical History:  ?Diagnosis Date  ? Anxiety   ? Arthritis   ? Asthma   ? Cervical hyperplasia   ? hisotry of  ? Chest pain   ? cath 2005 norm cors, repeat cath Feb 2011 normal cors and only minimally  elevated pulmonary pressures  ? Crohn disease ([place])   ? Diverticulitis of colon 07/17/2014  ? Diverticulosis   ? Dyspnea   ? GERD (gastroesophageal reflux disease)   ? Hiatal hernia   ? History of iron deficiency  anemia   ? Hypertension   ? Idiopathic intracranial hypertension   ? Per patient  ? Migraines   ? Obesity   ? Palpitations   ? Pneumonia 2008  ? 14 days hospitalization, bilateral  ? PONV (postoperative nausea and vomiting)   ? Sleep apnea sleep study 11/03/2009  ?  cpap; does  sometimes, doesnt use every night. weight loss no longer using CPAP  ? Vitamin B deficiency   ? ? ?Past Surgical History: ?Past Surgical History:  ?Procedure Laterality Date  ? ABDOMINAL HYSTERECTOMY    ? ANKLE ARTHROSCOPY  06/01/2012  ? Procedure: ANKLE ARTHROSCOPY;  Surgeon: Colin Rhein, MD;  Location: [facility];  Service: Orthopedics;  Laterality: Left;  left ankle arthorsocpy with extensive debridement and gastroc slide  ? ANKLE SURGERY Left 05/11/2018  ? BIOPSY  04/26/2019  ? Procedure: BIOPSY;  Surgeon: Rogene Houston, MD;  [facility];  Service: Endoscopy;;  ileum ?colon  ? CARDIAC CATHETERIZATION  11/22/2009 and 2005  ? WNL  ? CESAREAN SECTION    ? CHOLECYSTECTOMY  09/08/2006  ? lap. chole.  ? CHONDROPLASTY  06/17/2012  ? Procedure: CHONDROPLASTY;  Surgeon: Carole Civil, MD;  Location: [facility];  Service: Orthopedics;  Laterality: Right;  right patella  ? COLONOSCOPY N/A 04/26/2019  ? Rehman: a few erosions in ternimal ileum, two erosions in cecum (assocaited with acute inflammation) diverticulosis in sigmoid hepatic flexure and ascending colon, external hemorrhoids  ? ESOPHAGOGASTRODUODENOSCOPY N/A 05/14/2015  ? Procedure: ESOPHAGOGASTRODUODENOSCOPY (EGD);  Surgeon: Rogene Houston, MD;  [facility];  Service: Endoscopy;  Laterality: N/A;  730  ? GIVENS CAPSULE STUDY N/A 05/24/2019  ? Procedure: GIVENS CAPSULE STUDY;  Surgeon: Rogene Houston, MD;  [facility];  Service: Endoscopy;  Laterality: N/A;  7:30  ? INGUINAL HERNIA REPAIR  10/30/2008  ? right  ? KNEE ARTHROSCOPY Right 2013  ? KNEE ARTHROSCOPY WITH LATERAL MENISECTOMY Left 12/23/2016  ? Procedure: LEFT KNEE ARTHROSCOPY WITH LATERAL MENISECTOMY;  Surgeon: Carole Civil, MD;  Location: [facility];  Service: Orthopedics;  Laterality: Left;  ? KNEE ARTHROSCOPY WITH MEDIAL MENISECTOMY Left 12/23/2016  ? Procedure: LEFT KNEE ARTHROSCOPY WITH MEDIAL MENISECTOMY CHONDROPLASTY PATELLA  AND MEDIAL  FEMORAL CONDYLE LEFT KNEE;  Surgeon: Carole Civil, MD;  Location: [facility];  Service: Orthopedics;  Laterality: Left;  ? SHOULDER ARTHROSCOPY WITH SUBACROMIAL DECOMPRESSION AND BICEP TENDON REPAIR Left 12/21/2017  ? Procedure: Left shoulder arthroscopic biceps tenodesis, SAD, DCR and labrum debridement;  Surgeon: Nicholes Stairs, MD;  Location: [facility];  Service: Orthopedics;  Laterality: Left;  120 mins  ? SHOULDER SURGERY    ? right x 2   ? TOTAL KNEE ARTHROPLASTY Left 06/06/2019  ? Procedure: TOTAL KNEE ARTHROPLASTY;  Surgeon: Paralee Cancel, MD;  Location: [facility];  Service: Orthopedics;  Laterality: Left;  70 mins  ? TOTAL KNEE ARTHROPLASTY Right 01/23/2021  ? Procedure: TOTAL KNEE ARTHROPLASTY;  Surgeon: Paralee Cancel, MD;  Location: [facility];  Service: Orthopedics;  Laterality: Right;  70 mins  ? ? ?Family History: ?Family History  ?Problem Relation Age of Onset  ? Hypertension Mother   ? Atrial fibrillation Mother   ? Arthritis Mother   ? Lupus Father   ? COPD Father   ? Rheum arthritis Father   ? Sarcoidosis Father   ? Lupus Sister   ? Congestive Heart Failure Maternal Grandmother   ? Migraines Neg Hx   ? Headache Neg Hx   ? ? ?  Social History: ?Social History  ? ?Tobacco Use  ?Smoking Status Never  ?Smokeless Tobacco Never  ? ?Social History  ? ?Substance and Sexual Activity  ?Alcohol Use No  ? Alcohol/week: 0.0 standard drinks  ? ?Social History  ? ?Substance and Sexual Activity  ?Drug Use Never  ? ? ?Allergies: ?Allergies  ?Allergen Reactions  ? Iodinated Contrast Media Anaphylaxis and Hives  ? Other Shortness Of Breath  ?  Lemon grass  ? Wasp Venom Anaphylaxis and Shortness Of Breath  ? Pollen Extract Swelling  ? Adhesive [Tape] Rash  ? Hydromorphone Hcl Nausea And Vomiting  ? Omnipaque [Iohexol] Hives and Nausea Only  ?  Pt. Was premedicated with emergent protocol.  ? ? ?Medications: ?Current Outpatient Medications  ?Medication Sig Dispense Refill  ? acetaminophen (TYLENOL) 500 MG tablet Take 2  tablets (1,000 mg total) by mouth every 8 (eight) hours. (Patient taking differently: Take 1,000 mg by mouth every 8 (eight) hours as needed.) 30 tablet 0  ? acetaZOLAMIDE ER (DIAMOX) 500 MG capsule Take 1 capsule by m

## 2022-01-28 ENCOUNTER — Encounter (INDEPENDENT_AMBULATORY_CARE_PROVIDER_SITE_OTHER): Payer: Self-pay

## 2022-01-28 ENCOUNTER — Other Ambulatory Visit (INDEPENDENT_AMBULATORY_CARE_PROVIDER_SITE_OTHER): Payer: Self-pay

## 2022-01-28 ENCOUNTER — Telehealth (INDEPENDENT_AMBULATORY_CARE_PROVIDER_SITE_OTHER): Payer: Self-pay

## 2022-01-28 ENCOUNTER — Other Ambulatory Visit (HOSPITAL_COMMUNITY): Payer: Self-pay

## 2022-01-28 MED ORDER — NA SULFATE-K SULFATE-MG SULF 17.5-3.13-1.6 GM/177ML PO SOLN
1.0000 | ORAL | 0 refills | Status: DC
  Filled 2022-01-28 – 2022-03-09 (×2): qty 354, 1d supply, fill #0

## 2022-01-28 NOTE — Telephone Encounter (Signed)
[name], CMA  ?

## 2022-01-30 ENCOUNTER — Other Ambulatory Visit (HOSPITAL_COMMUNITY): Payer: Self-pay

## 2022-02-05 ENCOUNTER — Other Ambulatory Visit (HOSPITAL_COMMUNITY): Payer: Self-pay

## 2022-02-05 ENCOUNTER — Other Ambulatory Visit: Payer: Self-pay | Admitting: Neurology

## 2022-02-05 DIAGNOSIS — K5000 Crohn's disease of small intestine without complications: Secondary | ICD-10-CM | POA: Diagnosis not present

## 2022-02-05 MED ORDER — EMGALITY 120 MG/ML ~~LOC~~ SOAJ
120.0000 mg | SUBCUTANEOUS | 1 refills | Status: DC
  Filled 2022-02-05: qty 1, 30d supply, fill #0
  Filled 2022-03-09: qty 1, 30d supply, fill #1

## 2022-02-06 ENCOUNTER — Other Ambulatory Visit (HOSPITAL_COMMUNITY): Payer: Self-pay

## 2022-02-16 ENCOUNTER — Other Ambulatory Visit (HOSPITAL_COMMUNITY): Payer: Self-pay

## 2022-02-17 ENCOUNTER — Other Ambulatory Visit (HOSPITAL_COMMUNITY): Payer: Self-pay

## 2022-02-17 DIAGNOSIS — J069 Acute upper respiratory infection, unspecified: Secondary | ICD-10-CM | POA: Diagnosis not present

## 2022-02-17 DIAGNOSIS — E6609 Other obesity due to excess calories: Secondary | ICD-10-CM | POA: Diagnosis not present

## 2022-02-17 DIAGNOSIS — D511 Vitamin B12 deficiency anemia due to selective vitamin B12 malabsorption with proteinuria: Secondary | ICD-10-CM | POA: Diagnosis not present

## 2022-02-17 DIAGNOSIS — Z6834 Body mass index (BMI) 34.0-34.9, adult: Secondary | ICD-10-CM | POA: Diagnosis not present

## 2022-02-17 MED ORDER — FLUCONAZOLE 150 MG PO TABS
ORAL_TABLET | ORAL | 0 refills | Status: DC
  Filled 2022-02-17: qty 3, 12d supply, fill #0

## 2022-02-17 MED ORDER — BENZONATATE 200 MG PO CAPS
ORAL_CAPSULE | ORAL | 0 refills | Status: DC
  Filled 2022-02-17: qty 30, 10d supply, fill #0

## 2022-02-17 MED ORDER — GUAIFENESIN-CODEINE 100-10 MG/5ML PO SYRP
ORAL_SOLUTION | ORAL | 0 refills | Status: DC
  Filled 2022-02-17: qty 240, 6d supply, fill #0

## 2022-02-17 MED ORDER — AZITHROMYCIN 250 MG PO TABS
ORAL_TABLET | ORAL | 0 refills | Status: DC
  Filled 2022-02-17: qty 6, 5d supply, fill #0

## 2022-03-09 ENCOUNTER — Other Ambulatory Visit (HOSPITAL_COMMUNITY): Payer: Self-pay

## 2022-03-10 ENCOUNTER — Other Ambulatory Visit (HOSPITAL_COMMUNITY): Payer: Self-pay

## 2022-03-10 NOTE — Patient Instructions (Incomplete)
Below is our plan:  We will continue Diamox in the morning and 1000mg  at bedtime. Continue amitriptyline 25mg  every night. I will switch you back to Ajovy. Discontinue Emgality. Continue rizatriptan as needed.   Please make sure you are staying well hydrated. I recommend 50-60 ounces daily. Well balanced diet and regular exercise encouraged. Consistent sleep schedule with 6-8 hours recommended.   Please continue follow up with care team as directed.   Follow up with me in 6 months   You may receive a survey regarding today's visit. I encourage you to leave honest feed back as I do use this information to improve patient care. Thank you for seeing me today!

## 2022-03-10 NOTE — Progress Notes (Unsigned)
No chief complaint on file.  HISTORY OF PRESENT ILLNESS:  03/11/2022 ALL: [name] returns for follow up for migraines and IIH. She was last seen 03/2021 and doing well on Emgality, amitriptyline 85m QHS, acetazolamide 500 BID and rizatriptan PRN. She called 06/2021 with worsening tension headaches and advised to increase acetazolamide to 500/1000. Since,   Eye exam? BP?  03/11/2021 ALL:  PLakynreturns for follow up for migraines and IIH. She was taking Ajovy, amitriptyline, acetazolamide and rizatriptan. Ajovy was no longer covered on insurance and she was switched to [employer] She has had 1 injection. She is tolerating acetazolamide. Amitriptyline helps with sleep. She feels that headaches are well managed. She does have some sort of headache almost every day. She takes rizatriptan maybe once a month. Some months she doesn't have to take it. She had right TKR 01/23/2021. She took oxycodone post surgery but has not taken regularly. She has tried to wean gabapentin. She is having more low back pain and "nerve pain" of right leg. She called ortho for a refill of gabapentin.   10/14/2020 AA: Interval history October 13, 2020(last seen by myself 9/17 for appointment and 10/72021 for emg/ncs and also seen by NP Krisa Blattner 07/29/2020: Patient is here for a follow-up of migraines and other multiple neurologic symptoms.  At last appointment she was started on Ajovy and amitriptyline for her migraines, Diamox after LP with elevated opening pressure, rizatriptan for acute migraine management, and an EMG nerve conduction study was performed which did not show anything significant to explain all her other multiple neurologic symptoms(See below) in the setting of mood disorder which may be contributory.  Per Rosy Estabrook the rizatriptan was helping, amitriptyline was helping her sleep.  She also endorsed some decreased intensity of headaches and migraines to starting Ajovy, amitriptyline and Diamox.  Anajah Sterbenz  continued current treatment.  Since last being seen she was seen by ophthalmology.  Neuro exam is nonfocal.   Her headaches and migraines are feeling better. She still has the pressure in the head but improved. Increase Diamox, keep her on the Ajovy.   07/29/2020 ALL:  PKELLIS TOPETEis a 56y.o. female here today for follow up for headaches. She was started on Ajovy and amitriptyline for migraine prevention as well as rizatriptan for abortive therapy in 06/2020. MRI showed concerns for partially empty sella and she was started on Diamox 2559mBID. She had a LP on 07/17/2020 and started on Diamox 07/18/2020. She called 07/22/2020 reporting symptoms of increased pressure and pulsating of head with bilateral lower back pain (not at site of LP). Symptoms lasted for about 2-3 hours. She felt that she was having a hard time walking. She states that head pressure comes and goes. She has taken rizatriptan three times and it does abort migraine. She feels that she has brain fog at times. She has noted some tingling of feet. She is sleeping better after starting amitriptyline.   Low back continues with any prolonged standing. She has numbness and tingling of her lower extremities, worse on left. She reports her feet feel like they are going to sleep. NCS/EMG showed possible remote lumbosacral radiculopathy. She is was previously seeing Dr DeEstanislado Pandyor similar symptoms. Referral was placed back to Dr DeEstanislado Pandy   HISTORY (copied from Dr AhCathren Laineote on 06/21/2020)  HPI:  PeDAYELIN BALDUCCIs a [age].o. female here as requested by GoSharilyn SitesMD for headaches.  Past medical history obesity, headache, vitamin D  deficiency.  I reviewed Dr. Delanna Ahmadi notes which provided no significant history of patient's chief complaint.  She is here today alone, she has been having headaches, started sometime in May, she had a physical at the end of the May and discussed headaches have been going away, also tired no energy,  found out she was B12 deficiency and started shots per month.  Headaches are "minute", can also light sensitivity and nausea, she has some GI issues, Crohn's, has been on steroids.  She also reports nerve pain and stiffness in her hands and feet, she stopped using CPAP due to losing weight and long longer having any problems. She has been having headaches she has pressure in the back of her head at the occipital area and down to her neck, she has tried massage. They start in over the eyes as well. When they are really intense, she is sensitive to light, she has pressure in the back, nausea, pulsating/pulsing/throbbing. She is having the migraines at least 4x a week, they never really go away they just minimize, she will wake up with headaches, she had lost about 40 pounds and she has OSA in the past. She has gained 25 pounds since March, she doesn't sleep, ongoing for about a year after she found her brother dead in 08/24/23. She is fatigued, she has sleep issues. She staets she doesn't sleep all night and go without any sleep for 3 day - she says literally no sleep she turns a lot.  She has vision changes, headache are positional worse supinein the morning, morning headaches. She has muscle cramps, lots of stress. No other focal neurologic deficits, associated symptoms, inciting events or modifiable factors.   Reviewed notes, labs and imaging from outside physicians, which showed:   MRI brain 2017: personally reviewed images and agree with the following:  Brain: No diffusion signal abnormality. Stable partially empty sella turcica. No significant T2 FLAIR signal abnormality. No abnormal enhancement. No abnormal susceptibility hypointensity to indicate intracranial hemorrhage. No focal mass effect. No extra-axial collection. Normal ventricle size.   MRA H/N 09/2016 reviewed reports/findings:   1. No acute intracranial abnormality. Normal MRI of the brain for age. 2. Patent circle of Willis. No large  vessel occlusion, aneurysm, or significant stenosis is identified. 3. Patent carotid and vertebral arteries of the neck. No dissection, aneurysm, or significant stenosis is identified.   MRI cervical spine 01/08/2012:  personally reviewed images and agree with the following: 1. Tiny left paracentral disc protrusion at C5-6 without significant  canal or neural foraminal stenosis. Finding is stable relative to  previous MRI from 05/28/2007.  2. Mild degenerative disc disease at C4-5 without canal or foraminal  stenosis.  3. Otherwise unremarkable MRI of the cervical spine.    CBC/CMP: normal 04/12/2020   Amlodipine, tylenol, aspirin, flexeril, gabapentin, ibuprofen, toradol injection, magnesium, losartan, meclizine, mobic, reglan, zofran, tramadol, topiramate   REVIEW OF SYSTEMS: Out of a complete 14 system review of symptoms, the patient complains only of the following symptoms, headaches, numbness and tingling of extremities, brain fog, mass of right posterior thigh and all other reviewed systems are negative.   ALLERGIES: Allergies  Allergen Reactions   Iodinated Contrast Media Anaphylaxis and Hives   Other Shortness Of Breath    Lemon grass   Wasp Venom Anaphylaxis and Shortness Of Breath   Pollen Extract Swelling   Adhesive [Tape] Rash   Hydromorphone Hcl Nausea And Vomiting   Omnipaque [Iohexol] Hives and Nausea Only    Pt.  Was premedicated with emergent protocol.     HOME MEDICATIONS: Outpatient Medications Prior to Visit  Medication Sig Dispense Refill   acetaminophen (TYLENOL) 500 MG tablet Take 2 tablets (1,000 mg total) by mouth every 8 (eight) hours. (Patient taking differently: Take 1,000 mg by mouth every 8 (eight) hours as needed.) 30 tablet 0   acetaZOLAMIDE ER (DIAMOX) 500 MG capsule Take 1 capsule by mouth 3 times daily. 270 capsule 3   albuterol (PROVENTIL) (2.5 MG/3ML) 0.083% nebulizer solution Inhale 1 vial via nebulizer every 6 hours as needed 90 mL 1    albuterol (VENTOLIN HFA) 108 (90 Base) MCG/ACT inhaler Inhale 1-2 puffs by mouth every 4 hours as needed 18 g 2   amitriptyline (ELAVIL) 25 MG tablet TAKE 1 TABLET BY MOUTH AT BEDTIME 90 tablet 3   amLODipine (NORVASC) 5 MG tablet TAKE 1 TABLET BY MOUTH ONCE DAILY 90 tablet 2   amoxicillin (AMOXIL) 500 MG capsule TAKE 4 CAPSULES 1 HOUR PRIOR TO DENTAL APPOINTMENT. 12 capsule 0   azithromycin (ZITHROMAX Z-PAK) 250 MG tablet Take 2 tablets by mouth on day 1 then 1 tablet daily for 4 more days. 6 tablet 0   benzonatate (TESSALON) 200 MG capsule Take 1 capsule by mouth 3 times a day as needed 30 capsule 0   Cyanocobalamin (VITAMIN B-12 IJ) Inject as directed every 30 (thirty) days.     cyclobenzaprine (FLEXERIL) 5 MG tablet Take 1 tablet by mouth every 8 hours as needed for muscle spasm 40 tablet 0   diphenhydrAMINE (BENADRYL) 25 MG tablet Take 25 mg by mouth every 6 (six) hours as needed for allergies.     docusate sodium (COLACE) 100 MG capsule Take 1 capsule (100 mg total) by mouth 2 (two) times daily. (Patient taking differently: Take 100 mg by mouth 2 (two) times daily as needed.) 10 capsule 0   EPINEPHrine (EPIPEN) 0.3 mg/0.3 mL SOAJ injection Inject 0.3 mLs (0.3 mg total) into the muscle once. (Patient taking differently: Inject 0.3 mg into the muscle as needed.) 1 Device 0   famotidine (PEPCID) 40 MG tablet Take 1 tablet by mouth at bedtime. Needs office visit. 90 tablet 3   fluconazole (DIFLUCAN) 150 MG tablet Take 1 tablet by mouth every 4 days until gone 3 tablet 0   gabapentin (NEURONTIN) 300 MG capsule Take 1 capsule (300 mg total) by mouth at bedtime. 90 capsule 5   Galcanezumab-gnlm (EMGALITY) 120 MG/ML SOAJ Inject 120 mg into the skin every 30 (thirty) days. 1 mL 1   guaiFENesin-codeine (ROBITUSSIN AC) 100-10 MG/5ML syrup Take 10 MLs by mouth  every 6 hours as needed 240 mL 0   hyoscyamine (LEVSIN SL) 0.125 MG SL tablet Dissolve 1 tablet under the tongue 3 times daily before meals. 90  tablet 1   loperamide (IMODIUM) 2 MG capsule Take 1 capsule (2 mg total) by mouth daily with breakfast. (Patient taking differently: Take 2 mg by mouth as needed.)     loratadine (CLARITIN) 10 MG tablet Take 10 mg by mouth at bedtime.     losartan (COZAAR) 100 MG tablet TAKE 1 TABLET BY MOUTH ONCE DAILY 90 tablet 1   meclizine (ANTIVERT) 25 MG tablet Take 25 mg by mouth 3 (three) times daily as needed for dizziness.     montelukast (SINGULAIR) 10 MG tablet TAKE 1 TABLET BY MOUTH EVERY EVENING 90 tablet 2   Na Sulfate-K Sulfate-Mg Sulf (SUPREP BOWEL PREP KIT) 17.5-3.13-1.6 GM/177ML SOLN Take 1 kit by mouth as  directed. 354 mL 0   ondansetron (ZOFRAN) 4 MG tablet take 1 tablet by mouth every 8 hours as needed for nausea 30 tablet 0   ondansetron (ZOFRAN-ODT) 4 MG disintegrating tablet Dissolve 1 tablet by mouth every 8 hours as needed for nausea or vomiting. 20 tablet 0   pantoprazole (PROTONIX) 40 MG tablet Take 1 tablet by mouth daily before breakfast. 90 tablet 3   polyethylene glycol (MIRALAX / GLYCOLAX) 17 g packet Take 17 g by mouth daily.     rizatriptan (MAXALT-MLT) 10 MG disintegrating tablet Dissolve 1 tablet (10 mg total) by mouth as needed for migraine. May repeat in 2 hours if needed 9 tablet 11   tamsulosin (FLOMAX) 0.4 MG CAPS capsule Take 1 capsule (0.4 mg total) by mouth daily. 30 capsule 2   vedolizumab (ENTYVIO) 300 MG injection Inject 300 mg into the vein every 8 (eight) weeks.     No facility-administered medications prior to visit.     PAST MEDICAL HISTORY: Past Medical History:  Diagnosis Date   Anxiety    Arthritis    Asthma    Cervical hyperplasia    hisotry of   Chest pain    cath 2005 norm cors, repeat cath Feb 2011 normal cors and only minimally  elevated pulmonary pressures   Crohn disease ([place])    Diverticulitis of colon 07/17/2014   Diverticulosis    Dyspnea    GERD (gastroesophageal reflux disease)    Hiatal hernia    History of iron deficiency anemia     Hypertension    Idiopathic intracranial hypertension    Per patient   Migraines    Obesity    Palpitations    Pneumonia 2008   14 days hospitalization, bilateral   PONV (postoperative nausea and vomiting)    Sleep apnea sleep study 11/03/2009    cpap; does sometimes, doesnt use every night. weight loss no longer using CPAP   Vitamin B deficiency      PAST SURGICAL HISTORY: Past Surgical History:  Procedure Laterality Date   ABDOMINAL HYSTERECTOMY     ANKLE ARTHROSCOPY  06/01/2012   Procedure: ANKLE ARTHROSCOPY;  Surgeon: Colin Rhein, MD;  Location: [facility];  Service: Orthopedics;  Laterality: Left;  left ankle arthorsocpy with extensive debridement and gastroc slide   ANKLE SURGERY Left 05/11/2018   BIOPSY  04/26/2019   Procedure: BIOPSY;  Surgeon: Rogene Houston, MD;  [facility];  Service: Endoscopy;;  ileum colon   CARDIAC CATHETERIZATION  11/22/2009 and 2005   WNL   CESAREAN SECTION     CHOLECYSTECTOMY  09/08/2006   lap. chole.   CHONDROPLASTY  06/17/2012   Procedure: CHONDROPLASTY;  Surgeon: Carole Civil, MD;  Location: [facility];  Service: Orthopedics;  Laterality: Right;  right patella   COLONOSCOPY N/A 04/26/2019   Rehman: a few erosions in ternimal ileum, two erosions in cecum (assocaited with acute inflammation) diverticulosis in sigmoid hepatic flexure and ascending colon, external hemorrhoids   ESOPHAGOGASTRODUODENOSCOPY N/A 05/14/2015   Procedure: ESOPHAGOGASTRODUODENOSCOPY (EGD);  Surgeon: Rogene Houston, MD;  [facility];  Service: Endoscopy;  Laterality: N/A;  [place] N/A 05/24/2019   Procedure: GIVENS CAPSULE STUDY;  Surgeon: Rogene Houston, MD;  [facility];  Service: Endoscopy;  Laterality: N/A;  7:30   INGUINAL HERNIA REPAIR  10/30/2008   right   KNEE ARTHROSCOPY Right 2013   KNEE ARTHROSCOPY WITH LATERAL MENISECTOMY Left 12/23/2016  Procedure: LEFT KNEE ARTHROSCOPY WITH  LATERAL MENISECTOMY;  Surgeon: Carole Civil, MD;  Location: [facility];  Service: Orthopedics;  Laterality: Left;   KNEE ARTHROSCOPY WITH MEDIAL MENISECTOMY Left 12/23/2016   Procedure: LEFT KNEE ARTHROSCOPY WITH MEDIAL MENISECTOMY CHONDROPLASTY PATELLA  AND MEDIAL FEMORAL CONDYLE LEFT KNEE;  Surgeon: Carole Civil, MD;  Location: [facility];  Service: Orthopedics;  Laterality: Left;   SHOULDER ARTHROSCOPY WITH SUBACROMIAL DECOMPRESSION AND BICEP TENDON REPAIR Left 12/21/2017   Procedure: Left shoulder arthroscopic biceps tenodesis, SAD, DCR and labrum debridement;  Surgeon: Nicholes Stairs, MD;  Location: [facility];  Service: Orthopedics;  Laterality: Left;  120 mins   SHOULDER SURGERY     right x 2    TOTAL KNEE ARTHROPLASTY Left 06/06/2019   Procedure: TOTAL KNEE ARTHROPLASTY;  Surgeon: Paralee Cancel, MD;  Location: [facility];  Service: Orthopedics;  Laterality: Left;  70 mins   TOTAL KNEE ARTHROPLASTY Right 01/23/2021   Procedure: TOTAL KNEE ARTHROPLASTY;  Surgeon: Paralee Cancel, MD;  Location: [facility];  Service: Orthopedics;  Laterality: Right;  70 mins     FAMILY HISTORY: Family History  Problem Relation Age of Onset   Hypertension Mother    Atrial fibrillation Mother    Arthritis Mother    Lupus Father    COPD Father    Rheum arthritis Father    Sarcoidosis Father    Lupus Sister    Congestive Heart Failure Maternal Grandmother    Migraines Neg Hx    Headache Neg Hx      SOCIAL HISTORY: Social History   Socioeconomic History   Marital status: Married    Spouse name: Not on file   Number of children: 4   Years of education: Not on file   Highest education level: Not on file  Occupational History    Employer: [employer]    Comment: corporate safety  Tobacco Use   Smoking status: Never   Smokeless tobacco: Never  Vaping Use   Vaping Use: Never used  Substance and Sexual Activity   Alcohol use: No    Alcohol/week: 0.0 standard drinks   Drug use: Never   Sexual  activity: Yes    Birth control/protection: Surgical  Other Topics Concern   Not on file  Social History Narrative   Lives with husband and oldest son    Caffeine use: about 3 cups of coffee/day   She works in Chief Operating Officer with [employer]   She has 4 children age 72-22.     Social Determinants of Health   Financial Resource Strain: Not on file  Food Insecurity: Not on file  Transportation Needs: Not on file  Physical Activity: Not on file  Stress: Not on file  Social Connections: Not on file  Intimate Partner Violence: Not on file      PHYSICAL EXAM  There were no vitals filed for this visit.  There is no height or weight on file to calculate BMI.   Generalized: Well developed, in no acute distress   Neurological examination  Mentation: Alert oriented to time, place, history taking. Follows all commands speech and language fluent Cranial nerve II-XII: Pupils were equal round reactive to light. Extraocular movements were full, visual field were full on confrontational test. Facial sensation and strength were normal. Uvula tongue midline. Head turning and shoulder shrug  were normal and symmetric. Motor: The motor testing reveals 5 over 5 strength of all 4 extremities. Good symmetric motor tone is noted throughout.  Sensory:  Sensory testing is intact to soft touch on all 4 extremities. No evidence of extinction is noted.  Gait and station: Gait is arthritic, stable with cane    DIAGNOSTIC DATA (LABS, IMAGING, TESTING) - I reviewed patient records, labs, notes, testing and imaging myself where available.  Lab Results  Component Value Date   WBC 5.1 09/15/2021   HGB 12.8 09/15/2021   HCT 41.5 09/15/2021   MCV 92.0 09/15/2021   PLT 382 09/15/2021      Component Value Date/Time   NA 140 09/15/2021 1949   NA 144 09/07/2016 0907   K 3.4 (L) 09/15/2021 1949   CL 111 09/15/2021 1949   CO2 22 09/15/2021 1949   GLUCOSE 106 (H) 09/15/2021 1949   BUN 14 09/15/2021  1949   BUN 13 09/07/2016 0907   CREATININE 1.16 (H) 09/15/2021 1949   CREATININE 1.03 07/31/2021 1140   CALCIUM 9.0 09/15/2021 1949   PROT 7.3 09/15/2021 1949   ALBUMIN 4.1 09/15/2021 1949   AST 18 09/15/2021 1949   ALT 22 09/15/2021 1949   ALKPHOS 56 09/15/2021 1949   BILITOT 0.5 09/15/2021 1949   GFRNONAA 56 (L) 09/15/2021 1949   GFRNONAA 87 08/01/2019 1530   GFRAA >60 04/12/2020 1232   GFRAA 100 08/01/2019 1530   Lab Results  Component Value Date   CHOL 165 04/26/2016   HDL 41 04/26/2016   LDLCALC 88 04/26/2016   TRIG 181 (H) 04/26/2016   CHOLHDL 4.0 04/26/2016   Lab Results  Component Value Date   HGBA1C 5.8 (H) 04/26/2016   Lab Results  Component Value Date   VITAMINB12 345 07/31/2021   Lab Results  Component Value Date   TSH 0.880 07/11/2020      ASSESSMENT AND PLAN  56 y.o. year old female  has a past medical history of Anxiety, Arthritis, Asthma, Cervical hyperplasia, Chest pain, Crohn disease ([place]), Diverticulitis of colon (07/17/2014), Diverticulosis, Dyspnea, GERD (gastroesophageal reflux disease), Hiatal hernia, History of iron deficiency anemia, Hypertension, Idiopathic intracranial hypertension, Migraines, Obesity, Palpitations, Pneumonia (2008), PONV (postoperative nausea and vomiting), Sleep apnea (sleep study 11/03/2009), and Vitamin B deficiency. here with   No diagnosis found.  Kyonna repots that headaches are well managed at this time. Although she continues to have a mild tension headache most days, she does not feel these are bothersome. We will continue Emgality, acetazolamide, amitriptyline and rizatriptan as advised. Gabapentin may be helpful for leg pain and headaches. She will discuss with ortho. Healthy lifestyle habits encouraged. Follow-up recommended in 1 year, sooner if needed. She verbalizes understanding and agreement with this plan.  Debbora Presto, MSN, FNP-C 03/10/2022, 8:33 AM  [facility] [address], [place], [place] [address] [phone]

## 2022-03-11 ENCOUNTER — Encounter: Payer: Self-pay | Admitting: Family Medicine

## 2022-03-11 ENCOUNTER — Ambulatory Visit (INDEPENDENT_AMBULATORY_CARE_PROVIDER_SITE_OTHER): Payer: 59 | Admitting: Family Medicine

## 2022-03-11 ENCOUNTER — Other Ambulatory Visit (HOSPITAL_COMMUNITY): Payer: Self-pay

## 2022-03-11 ENCOUNTER — Telehealth: Payer: Self-pay | Admitting: *Deleted

## 2022-03-11 VITALS — BP 128/85 | HR 77 | Ht 71.5 in | Wt 253.0 lb

## 2022-03-11 DIAGNOSIS — G932 Benign intracranial hypertension: Secondary | ICD-10-CM

## 2022-03-11 DIAGNOSIS — G43709 Chronic migraine without aura, not intractable, without status migrainosus: Secondary | ICD-10-CM | POA: Diagnosis not present

## 2022-03-11 MED ORDER — ACETAZOLAMIDE ER 500 MG PO CP12
500.0000 mg | ORAL_CAPSULE | Freq: Three times a day (TID) | ORAL | 3 refills | Status: DC
  Filled 2022-03-11 – 2022-07-22 (×2): qty 270, 90d supply, fill #0
  Filled 2022-10-19: qty 270, 90d supply, fill #1
  Filled 2023-02-17 – 2023-02-18 (×2): qty 270, 90d supply, fill #2

## 2022-03-11 MED ORDER — AMITRIPTYLINE HCL 25 MG PO TABS
ORAL_TABLET | Freq: Every day | ORAL | 3 refills | Status: DC
  Filled 2022-03-11: qty 90, fill #0
  Filled 2022-05-05: qty 90, 90d supply, fill #0
  Filled 2022-08-28: qty 20, 20d supply, fill #1
  Filled 2022-08-29: qty 70, 70d supply, fill #1
  Filled 2022-12-27: qty 90, 90d supply, fill #2

## 2022-03-11 MED ORDER — RIZATRIPTAN BENZOATE 10 MG PO TBDP
10.0000 mg | ORAL_TABLET | ORAL | 11 refills | Status: DC | PRN
  Filled 2022-03-11: qty 9, 28d supply, fill #0
  Filled 2022-07-22: qty 9, 28d supply, fill #1
  Filled 2022-08-28: qty 9, 28d supply, fill #2

## 2022-03-11 MED ORDER — AJOVY 225 MG/1.5ML ~~LOC~~ SOAJ
1.5000 mL | SUBCUTANEOUS | 3 refills | Status: DC
  Filled 2022-03-11: qty 1.5, 30d supply, fill #0
  Filled 2022-04-29 – 2022-05-05 (×2): qty 1.5, 28d supply, fill #0
  Filled 2022-05-06: qty 4.5, 90d supply, fill #0

## 2022-03-11 NOTE — Telephone Encounter (Signed)
Submitted PA Ajovy on CMM. Key: [id number]. Waiting on determination from [place].

## 2022-03-13 ENCOUNTER — Other Ambulatory Visit (HOSPITAL_COMMUNITY): Payer: Self-pay

## 2022-03-13 MED ORDER — CYCLOBENZAPRINE HCL 5 MG PO TABS
ORAL_TABLET | ORAL | 0 refills | Status: DC
  Filled 2022-03-13: qty 40, 14d supply, fill #0

## 2022-03-17 ENCOUNTER — Telehealth (INDEPENDENT_AMBULATORY_CARE_PROVIDER_SITE_OTHER): Payer: Self-pay

## 2022-03-17 NOTE — Telephone Encounter (Signed)
Is it ok to take Amoxicillin 500 mg 4 capsule prior to dental visit tomorrow? Has upcoming Entyvio infusion on 04/02/2022, and does not want to do anything to interfere with her infusions. She says they ask her at every infusion if she has been on any antibiotics recently. Please advise if ok or not. Thanks

## 2022-03-17 NOTE — Telephone Encounter (Signed)
It should be OK to take the antibiotics. These are given for prophylaxis most likely. She is asked about the antibiotics prior to [facility] just to confirm if she had any recent ongoing infections.

## 2022-03-18 ENCOUNTER — Other Ambulatory Visit (HOSPITAL_COMMUNITY): Payer: Self-pay

## 2022-03-18 MED ORDER — AMOXICILLIN 500 MG PO CAPS
ORAL_CAPSULE | ORAL | 0 refills | Status: DC
  Filled 2022-03-18 – 2022-07-22 (×2): qty 12, 3d supply, fill #0

## 2022-03-18 NOTE — Telephone Encounter (Signed)
Patient aware of all.

## 2022-03-20 DIAGNOSIS — D511 Vitamin B12 deficiency anemia due to selective vitamin B12 malabsorption with proteinuria: Secondary | ICD-10-CM | POA: Diagnosis not present

## 2022-03-23 ENCOUNTER — Encounter (HOSPITAL_COMMUNITY)
Admission: RE | Admit: 2022-03-23 | Discharge: 2022-03-23 | Disposition: A | Discharge status: Home / Self Care | Payer: 59 | Source: Ambulatory Visit | Attending: Gastroenterology | Admitting: Gastroenterology

## 2022-03-23 ENCOUNTER — Encounter (HOSPITAL_COMMUNITY): Payer: Self-pay

## 2022-03-23 DIAGNOSIS — Z0000 Encounter for general adult medical examination without abnormal findings: Secondary | ICD-10-CM | POA: Diagnosis not present

## 2022-03-23 DIAGNOSIS — Z1331 Encounter for screening for depression: Secondary | ICD-10-CM | POA: Diagnosis not present

## 2022-03-23 DIAGNOSIS — R7309 Other abnormal glucose: Secondary | ICD-10-CM | POA: Diagnosis not present

## 2022-03-23 DIAGNOSIS — Z6835 Body mass index (BMI) 35.0-35.9, adult: Secondary | ICD-10-CM | POA: Diagnosis not present

## 2022-03-23 DIAGNOSIS — I10 Essential (primary) hypertension: Secondary | ICD-10-CM | POA: Diagnosis not present

## 2022-03-23 DIAGNOSIS — G4733 Obstructive sleep apnea (adult) (pediatric): Secondary | ICD-10-CM | POA: Diagnosis not present

## 2022-03-23 DIAGNOSIS — D511 Vitamin B12 deficiency anemia due to selective vitamin B12 malabsorption with proteinuria: Secondary | ICD-10-CM | POA: Diagnosis not present

## 2022-03-23 DIAGNOSIS — G43909 Migraine, unspecified, not intractable, without status migrainosus: Secondary | ICD-10-CM | POA: Diagnosis not present

## 2022-03-23 DIAGNOSIS — M1991 Primary osteoarthritis, unspecified site: Secondary | ICD-10-CM | POA: Diagnosis not present

## 2022-03-23 DIAGNOSIS — K5090 Crohn's disease, unspecified, without complications: Secondary | ICD-10-CM | POA: Diagnosis not present

## 2022-03-23 DIAGNOSIS — N200 Calculus of kidney: Secondary | ICD-10-CM | POA: Diagnosis not present

## 2022-03-23 DIAGNOSIS — R319 Hematuria, unspecified: Secondary | ICD-10-CM | POA: Diagnosis not present

## 2022-03-25 ENCOUNTER — Ambulatory Visit (HOSPITAL_BASED_OUTPATIENT_CLINIC_OR_DEPARTMENT_OTHER): Payer: 59 | Admitting: Anesthesiology

## 2022-03-25 ENCOUNTER — Encounter (HOSPITAL_COMMUNITY): Payer: Self-pay | Admitting: Gastroenterology

## 2022-03-25 ENCOUNTER — Ambulatory Visit (HOSPITAL_COMMUNITY): Payer: 59 | Admitting: Anesthesiology

## 2022-03-25 ENCOUNTER — Ambulatory Visit (HOSPITAL_COMMUNITY)
Admission: RE | Admit: 2022-03-25 | Discharge: 2022-03-25 | Disposition: A | Discharge status: Home / Self Care | Payer: 59 | Source: Ambulatory Visit | Attending: Gastroenterology | Admitting: Gastroenterology

## 2022-03-25 ENCOUNTER — Encounter (HOSPITAL_COMMUNITY)
Admission: RE | Disposition: A | Discharge status: Home / Self Care | Payer: Self-pay | Source: Ambulatory Visit | Attending: Gastroenterology

## 2022-03-25 ENCOUNTER — Other Ambulatory Visit: Payer: Self-pay

## 2022-03-25 DIAGNOSIS — K5080 Crohn's disease of both small and large intestine without complications: Secondary | ICD-10-CM | POA: Diagnosis not present

## 2022-03-25 DIAGNOSIS — Z6834 Body mass index (BMI) 34.0-34.9, adult: Secondary | ICD-10-CM | POA: Diagnosis not present

## 2022-03-25 DIAGNOSIS — M76829 Posterior tibial tendinitis, unspecified leg: Secondary | ICD-10-CM

## 2022-03-25 DIAGNOSIS — M1712 Unilateral primary osteoarthritis, left knee: Secondary | ICD-10-CM

## 2022-03-25 DIAGNOSIS — R739 Hyperglycemia, unspecified: Secondary | ICD-10-CM

## 2022-03-25 DIAGNOSIS — K50819 Crohn's disease of both small and large intestine with unspecified complications: Secondary | ICD-10-CM | POA: Insufficient documentation

## 2022-03-25 DIAGNOSIS — R197 Diarrhea, unspecified: Secondary | ICD-10-CM | POA: Diagnosis not present

## 2022-03-25 DIAGNOSIS — G4730 Sleep apnea, unspecified: Secondary | ICD-10-CM | POA: Diagnosis not present

## 2022-03-25 DIAGNOSIS — E876 Hypokalemia: Secondary | ICD-10-CM

## 2022-03-25 DIAGNOSIS — R0602 Shortness of breath: Secondary | ICD-10-CM

## 2022-03-25 DIAGNOSIS — I10 Essential (primary) hypertension: Secondary | ICD-10-CM | POA: Insufficient documentation

## 2022-03-25 DIAGNOSIS — K222 Esophageal obstruction: Secondary | ICD-10-CM | POA: Insufficient documentation

## 2022-03-25 DIAGNOSIS — R110 Nausea: Secondary | ICD-10-CM | POA: Diagnosis not present

## 2022-03-25 DIAGNOSIS — Z96651 Presence of right artificial knee joint: Secondary | ICD-10-CM

## 2022-03-25 DIAGNOSIS — E669 Obesity, unspecified: Secondary | ICD-10-CM | POA: Insufficient documentation

## 2022-03-25 DIAGNOSIS — M23322 Other meniscus derangements, posterior horn of medial meniscus, left knee: Secondary | ICD-10-CM

## 2022-03-25 DIAGNOSIS — K6389 Other specified diseases of intestine: Secondary | ICD-10-CM | POA: Insufficient documentation

## 2022-03-25 DIAGNOSIS — K5090 Crohn's disease, unspecified, without complications: Secondary | ICD-10-CM | POA: Diagnosis not present

## 2022-03-25 DIAGNOSIS — K219 Gastro-esophageal reflux disease without esophagitis: Secondary | ICD-10-CM | POA: Diagnosis not present

## 2022-03-25 DIAGNOSIS — K5730 Diverticulosis of large intestine without perforation or abscess without bleeding: Secondary | ICD-10-CM | POA: Insufficient documentation

## 2022-03-25 DIAGNOSIS — K635 Polyp of colon: Secondary | ICD-10-CM

## 2022-03-25 DIAGNOSIS — J45909 Unspecified asthma, uncomplicated: Secondary | ICD-10-CM | POA: Insufficient documentation

## 2022-03-25 DIAGNOSIS — R1032 Left lower quadrant pain: Secondary | ICD-10-CM

## 2022-03-25 DIAGNOSIS — K449 Diaphragmatic hernia without obstruction or gangrene: Secondary | ICD-10-CM | POA: Diagnosis not present

## 2022-03-25 DIAGNOSIS — R002 Palpitations: Secondary | ICD-10-CM

## 2022-03-25 DIAGNOSIS — G932 Benign intracranial hypertension: Secondary | ICD-10-CM

## 2022-03-25 DIAGNOSIS — M25561 Pain in right knee: Secondary | ICD-10-CM

## 2022-03-25 DIAGNOSIS — R112 Nausea with vomiting, unspecified: Secondary | ICD-10-CM | POA: Diagnosis not present

## 2022-03-25 DIAGNOSIS — R109 Unspecified abdominal pain: Secondary | ICD-10-CM | POA: Diagnosis not present

## 2022-03-25 DIAGNOSIS — D509 Iron deficiency anemia, unspecified: Secondary | ICD-10-CM

## 2022-03-25 DIAGNOSIS — R198 Other specified symptoms and signs involving the digestive system and abdomen: Secondary | ICD-10-CM

## 2022-03-25 DIAGNOSIS — L819 Disorder of pigmentation, unspecified: Secondary | ICD-10-CM

## 2022-03-25 DIAGNOSIS — R202 Paresthesia of skin: Secondary | ICD-10-CM

## 2022-03-25 DIAGNOSIS — D120 Benign neoplasm of cecum: Secondary | ICD-10-CM | POA: Insufficient documentation

## 2022-03-25 DIAGNOSIS — M79641 Pain in right hand: Secondary | ICD-10-CM

## 2022-03-25 DIAGNOSIS — G43709 Chronic migraine without aura, not intractable, without status migrainosus: Secondary | ICD-10-CM

## 2022-03-25 DIAGNOSIS — K5000 Crohn's disease of small intestine without complications: Secondary | ICD-10-CM

## 2022-03-25 DIAGNOSIS — K5732 Diverticulitis of large intestine without perforation or abscess without bleeding: Secondary | ICD-10-CM

## 2022-03-25 DIAGNOSIS — M2550 Pain in unspecified joint: Secondary | ICD-10-CM

## 2022-03-25 DIAGNOSIS — R29898 Other symptoms and signs involving the musculoskeletal system: Secondary | ICD-10-CM

## 2022-03-25 DIAGNOSIS — Z96652 Presence of left artificial knee joint: Secondary | ICD-10-CM

## 2022-03-25 DIAGNOSIS — K529 Noninfective gastroenteritis and colitis, unspecified: Secondary | ICD-10-CM | POA: Diagnosis not present

## 2022-03-25 DIAGNOSIS — Z9889 Other specified postprocedural states: Secondary | ICD-10-CM

## 2022-03-25 DIAGNOSIS — M23342 Other meniscus derangements, anterior horn of lateral meniscus, left knee: Secondary | ICD-10-CM

## 2022-03-25 DIAGNOSIS — Z1211 Encounter for screening for malignant neoplasm of colon: Secondary | ICD-10-CM

## 2022-03-25 HISTORY — PX: POLYPECTOMY: SHX5525

## 2022-03-25 HISTORY — PX: COLONOSCOPY WITH PROPOFOL: SHX5780

## 2022-03-25 HISTORY — PX: BIOPSY: SHX5522

## 2022-03-25 HISTORY — PX: ESOPHAGOGASTRODUODENOSCOPY (EGD) WITH PROPOFOL: SHX5813

## 2022-03-25 LAB — HM COLONOSCOPY

## 2022-03-25 SURGERY — COLONOSCOPY WITH PROPOFOL
Anesthesia: General

## 2022-03-25 MED ORDER — PROPOFOL 10 MG/ML IV BOLUS
INTRAVENOUS | Status: DC | PRN
  Administered 2022-03-25: 30 mg via INTRAVENOUS
  Administered 2022-03-25: 100 mg via INTRAVENOUS
  Administered 2022-03-25 (×2): 50 mg via INTRAVENOUS

## 2022-03-25 MED ORDER — PROPOFOL 500 MG/50ML IV EMUL
INTRAVENOUS | Status: DC | PRN
  Administered 2022-03-25: 150 ug/kg/min via INTRAVENOUS

## 2022-03-25 MED ORDER — LIDOCAINE HCL (CARDIAC) PF 100 MG/5ML IV SOSY
PREFILLED_SYRINGE | INTRAVENOUS | Status: DC | PRN
  Administered 2022-03-25: 50 mg via INTRAVENOUS

## 2022-03-25 MED ORDER — LACTATED RINGERS IV SOLN: INTRAVENOUS | Status: DC

## 2022-03-25 NOTE — Op Note (Addendum)
[facility] Patient Name: [name] Procedure Date: 03/25/2022 8:27 AM MRN: [id number] Date of Birth: July 20, 1966 Attending MD: [name] ,  CSN: [id number] Age: 56 Admit Type: Outpatient Procedure:                Upper GI endoscopy Indications:              Abdominal pain, Nausea with vomiting Providers:                [name], Janeece Riggers, RN, Raphael Gibney,                            Technician Referring MD:              Medicines:                Monitored Anesthesia Care Complications:            No immediate complications. Estimated Blood Loss:     Estimated blood loss: none. Procedure:                Pre-Anesthesia Assessment:                           - Prior to the procedure, a History and Physical                            was performed, and patient medications, allergies                            and sensitivities were reviewed. The patient's                            tolerance of previous anesthesia was reviewed.                           - The risks and benefits of the procedure and the                            sedation options and risks were discussed with the                            patient. All questions were answered and informed                            consent was obtained.                           - ASA Grade Assessment: II - A patient with mild                            systemic disease.                           After obtaining informed consent, the endoscope was                            passed under direct vision. Throughout the  procedure, the patient's blood pressure, pulse, and                            oxygen saturations were monitored continuously. The                            [id number]-[id number] ([id number]) scope was introduced through the                            mouth, and advanced to the second part of duodenum.                            The upper GI endoscopy was accomplished without                             difficulty. The patient tolerated the procedure                            well. Scope In: 8:38:01 AM Scope Out: 8:46:16 AM Total Procedure Duration: 0 hours 8 minutes 15 seconds  Findings:      A 3 cm hiatal hernia was found. The proximal extent of the gastric folds       (end of tubular esophagus) was 40 cm from the incisors. The hiatal       narrowing was 40 cm from the incisors. The Z-line was 37 cm from the       incisors.      A non-obstructing Schatzki ring was found at the gastroesophageal       junction. A cold forceps was used to disrupt the rim of the ring.      The entire examined stomach was normal.      The examined duodenum was normal. Biopsies were taken with a cold       forceps for histology. Impression:               - 3 cm hiatal hernia.                           - Non-obstructing Schatzki ring.                           - Normal stomach.                           - Normal examined duodenum. Biopsied. Moderate Sedation:      Per Anesthesia Care Recommendation:           - Discharge patient to home (ambulatory).                           - Resume previous diet.                           - Await pathology results.                           - Continue present medications. Procedure Code(s):        --- Professional ---  21828, Esophagogastroduodenoscopy, flexible,                            transoral; with biopsy, single or multiple Diagnosis Code(s):        --- Professional ---                           K44.9, Diaphragmatic hernia without obstruction or                            gangrene                           K22.2, Esophageal obstruction                           R10.9, Unspecified abdominal pain                           R11.2, Nausea with vomiting, unspecified CPT copyright 2019 American Medical Association. All rights reserved. The codes documented in this report are preliminary and upon coder review may  be revised  to meet current compliance requirements. [name], MD [name],  03/25/2022 8:50:46 AM This report has been signed electronically. Number of Addenda: 0

## 2022-03-25 NOTE — Anesthesia Postprocedure Evaluation (Signed)
Anesthesia Post Note  Patient: [name]  Procedure(s) Performed: COLONOSCOPY WITH PROPOFOL ESOPHAGOGASTRODUODENOSCOPY (EGD) WITH PROPOFOL BIOPSY POLYPECTOMY  Patient location during evaluation: Phase II Anesthesia Type: General Level of consciousness: awake and alert and oriented Pain management: pain level controlled Vital Signs Assessment: post-procedure vital signs reviewed and stable Respiratory status: spontaneous breathing, nonlabored ventilation and respiratory function stable Cardiovascular status: blood pressure returned to baseline and stable Postop Assessment: no apparent nausea or vomiting Anesthetic complications: no   No notable events documented.   Last Vitals:  Vitals:   03/25/22 0722 03/25/22 0927  BP: 117/79 98/62  Pulse: 88 79  Resp: 16 19  Temp: 36.7 C (!) 36.4 C  SpO2: 99% 100%    Last Pain:  Vitals:   03/25/22 0927  TempSrc: Oral  PainSc: 0-No pain                 [name] C [name]

## 2022-03-25 NOTE — Op Note (Signed)
[facility] Patient Name: [name] Procedure Date: 03/25/2022 8:26 AM MRN: [id number] Date of Birth: 10/03/1966 Attending MD: [name] ,  CSN: [id number] Age: 56 Admit Type: Outpatient Procedure:                Colonoscopy Indications:              Crohn's disease of the small bowel and colon Providers:                [name], Janeece Riggers, RN, Raphael Gibney,                            Technician Referring MD:              Medicines:                Monitored Anesthesia Care Complications:            No immediate complications. Estimated Blood Loss:     Estimated blood loss: none. Procedure:                Pre-Anesthesia Assessment:                           - Prior to the procedure, a History and Physical                            was performed, and patient medications, allergies                            and sensitivities were reviewed. The patient's                            tolerance of previous anesthesia was reviewed.                           - The risks and benefits of the procedure and the                            sedation options and risks were discussed with the                            patient. All questions were answered and informed                            consent was obtained.                           - ASA Grade Assessment: II - A patient with mild                            systemic disease.                           After obtaining informed consent, the colonoscope                            was passed under direct vision. Throughout the  procedure, the patient's blood pressure, pulse, and                            oxygen saturations were monitored continuously. The                            [id number]-[id number] ([id number]) scope was introduced through                            the anus and advanced to the the cecum, identified                            by appendiceal orifice and ileocecal valve. The                             colonoscopy was performed without difficulty. The                            patient tolerated the procedure well. The quality                            of the bowel preparation was good. Scope In: 8:52:32 AM Scope Out: 9:22:48 AM Scope Withdrawal Time: 0 hours 20 minutes 9 seconds  Total Procedure Duration: 0 hours 30 minutes 16 seconds  Findings:      The perianal and digital rectal examinations were normal.      A 4 mm polyp was found in the cecum. The polyp was sessile. The polyp       was removed with a cold snare. Resection and retrieval were complete.      A diffuse area of moderate melanosis was found in the entire colon.      Multiple small and large-mouthed diverticula were found in the entire       colon.      The retroflexed view of the distal rectum and anal verge was normal and       showed no anal or rectal abnormalities.      Note: due to redundancy of the colon, the TI could not be intubated. The       IC valve was normal. There was no presence of inflammation upon careful       inspection of the colon.      Suspect symptoms are related to overlapping IBS. Impression:               - One 4 mm polyp in the cecum, removed with a cold                            snare. Resected and retrieved.                           - Melanosis in the colon.                           - Diverticulosis in the entire examined colon.                           -  The distal rectum and anal verge are normal on                            retroflexion view. Moderate Sedation:      Per Anesthesia Care Recommendation:           - Discharge patient to home (ambulatory).                           - Resume previous diet.                           - Await pathology results.                           - Repeat colonoscopy in 5 years for surveillance.                           - Continue Entyvio every 8 weeks.                           - Check Entyvio levels prior to next dose.                            - Continue Levsin as needed for abdominal pain. Procedure Code(s):        --- Professional ---                           [phone], Colonoscopy, flexible; with removal of                            tumor(s), polyp(s), or other lesion(s) by snare                            technique Diagnosis Code(s):        --- Professional ---                           K63.5, Polyp of colon                           K63.89, Other specified diseases of intestine                           K50.80, Crohn's disease of both small and large                            intestine without complications                           K57.30, Diverticulosis of large intestine without                            perforation or abscess without bleeding CPT copyright 2019 American Medical Association. All rights reserved. The codes documented in this report are preliminary and upon coder review may  be revised to meet current compliance requirements. [name],  MD [name],  03/25/2022 9:32:21 AM This report has been signed electronically. Number of Addenda: 0

## 2022-03-25 NOTE — H&P (Signed)
[name] is an 56 y.o. female.   Chief Complaint: Bloating, abdominal pain, diarrhea and Crohn's disease HPI: [name] is a 56 y.o.  female with past medical history of asthma, small bowel Crohn's disease, GERD, hypertension, obesity, diverticulosis and multiple episodes diverticulitis, coming for follow up of Crohn's disease, diarrhea, abdominal pain and bloating.  Patient reports some improvement of her symptoms as she was having 3-4 bowel movements per day but had recurrent episode of abdominal pain described as cramping and abdominal discomfort with bloating.  No melena or hematochezia.  Past Medical History:  Diagnosis Date   Anxiety    Arthritis    Asthma    Cervical hyperplasia    hisotry of   Chest pain    cath 2005 norm cors, repeat cath Feb 2011 normal cors and only minimally  elevated pulmonary pressures   Crohn disease ([place])    Diverticulitis of colon 07/17/2014   Diverticulosis    Dyspnea    GERD (gastroesophageal reflux disease)    Hiatal hernia    History of iron deficiency anemia    Hypertension    Idiopathic intracranial hypertension    Per patient   Migraines    Obesity    Palpitations    Pneumonia 2008   14 days hospitalization, bilateral   PONV (postoperative nausea and vomiting)    Sleep apnea sleep study 11/03/2009    cpap; does sometimes, doesnt use every night. weight loss no longer using CPAP   Vitamin B deficiency     Past Surgical History:  Procedure Laterality Date   ABDOMINAL HYSTERECTOMY     ANKLE ARTHROSCOPY  06/01/2012   Procedure: ANKLE ARTHROSCOPY;  Surgeon: Colin Rhein, MD;  Location: [place];  Service: Orthopedics;  Laterality: Left;  left ankle arthorsocpy with extensive debridement and gastroc slide   ANKLE SURGERY Left 05/11/2018   BIOPSY  04/26/2019   Procedure: BIOPSY;  Surgeon: Rogene Houston, MD;  [facility];  Service: Endoscopy;;  ileum colon   CARDIAC CATHETERIZATION  11/22/2009  and 2005   WNL   CESAREAN SECTION     CHOLECYSTECTOMY  09/08/2006   lap. chole.   CHONDROPLASTY  06/17/2012   Procedure: CHONDROPLASTY;  Surgeon: Carole Civil, MD;  Location: [facility];  Service: Orthopedics;  Laterality: Right;  right patella   COLONOSCOPY N/A 04/26/2019   Rehman: a few erosions in ternimal ileum, two erosions in cecum (assocaited with acute inflammation) diverticulosis in sigmoid hepatic flexure and ascending colon, external hemorrhoids   ESOPHAGOGASTRODUODENOSCOPY N/A 05/14/2015   Procedure: ESOPHAGOGASTRODUODENOSCOPY (EGD);  Surgeon: Rogene Houston, MD;  [facility];  Service: Endoscopy;  Laterality: N/A;  [place] N/A 05/24/2019   Procedure: GIVENS CAPSULE STUDY;  Surgeon: Rogene Houston, MD;  [facility];  Service: Endoscopy;  Laterality: N/A;  7:30   INGUINAL HERNIA REPAIR  10/30/2008   right   KNEE ARTHROSCOPY Right 2013   KNEE ARTHROSCOPY WITH LATERAL MENISECTOMY Left 12/23/2016   Procedure: LEFT KNEE ARTHROSCOPY WITH LATERAL MENISECTOMY;  Surgeon: Carole Civil, MD;  Location: [facility];  Service: Orthopedics;  Laterality: Left;   KNEE ARTHROSCOPY WITH MEDIAL MENISECTOMY Left 12/23/2016   Procedure: LEFT KNEE ARTHROSCOPY WITH MEDIAL MENISECTOMY CHONDROPLASTY PATELLA  AND MEDIAL FEMORAL CONDYLE LEFT KNEE;  Surgeon: Carole Civil, MD;  Location: [facility];  Service: Orthopedics;  Laterality: Left;   SHOULDER ARTHROSCOPY WITH SUBACROMIAL DECOMPRESSION AND BICEP TENDON REPAIR  Left 12/21/2017   Procedure: Left shoulder arthroscopic biceps tenodesis, SAD, DCR and labrum debridement;  Surgeon: Nicholes Stairs, MD;  Location: [place];  Service: Orthopedics;  Laterality: Left;  120 mins   SHOULDER SURGERY     right x 2    TOTAL KNEE ARTHROPLASTY Left 06/06/2019   Procedure: TOTAL KNEE ARTHROPLASTY;  Surgeon: Paralee Cancel, MD;  Location: [facility];  Service: Orthopedics;  Laterality: Left;  70 mins   TOTAL KNEE  ARTHROPLASTY Right 01/23/2021   Procedure: TOTAL KNEE ARTHROPLASTY;  Surgeon: Paralee Cancel, MD;  Location: [facility];  Service: Orthopedics;  Laterality: Right;  70 mins    Family History  Problem Relation Age of Onset   Hypertension Mother    Atrial fibrillation Mother    Arthritis Mother    Lupus Father    COPD Father    Rheum arthritis Father    Sarcoidosis Father    Lupus Sister    Congestive Heart Failure Maternal Grandmother    Migraines Neg Hx    Headache Neg Hx    Social History:  reports that she has never smoked. She has never used smokeless tobacco. She reports that she does not drink alcohol and does not use drugs.  Allergies:  Allergies  Allergen Reactions   Iodinated Contrast Media Anaphylaxis and Hives   Other Shortness Of Breath    Lemon grass   Wasp Venom Anaphylaxis and Shortness Of Breath   Pollen Extract Swelling   Adhesive [Tape] Rash   Hydromorphone Hcl Nausea And Vomiting    headaches   Omnipaque [Iohexol] Hives and Nausea Only    Pt. Was premedicated with emergent protocol.    Medications Prior to Admission  Medication Sig Dispense Refill   acetaminophen (TYLENOL) 500 MG tablet Take 2 tablets (1,000 mg total) by mouth every 8 (eight) hours. (Patient taking differently: Take 1,000 mg by mouth every 8 (eight) hours as needed for moderate pain.) 30 tablet 0   acetaZOLAMIDE ER (DIAMOX) 500 MG capsule Take 1 capsule by mouth 3 times daily. (Patient taking differently: Take 500-1,000 mg by mouth See admin instructions. Take 500 mg in the morning and 1000 mg at night) 270 capsule 3   albuterol (PROVENTIL) (2.5 MG/3ML) 0.083% nebulizer solution Inhale 1 vial via nebulizer every 6 hours as needed 90 mL 1   albuterol (VENTOLIN HFA) 108 (90 Base) MCG/ACT inhaler Inhale 1-2 puffs by mouth every 4 hours as needed 18 g 2   amitriptyline (ELAVIL) 25 MG tablet TAKE 1 TABLET BY MOUTH AT BEDTIME 90 tablet 3   amLODipine (NORVASC) 5 MG tablet TAKE 1 TABLET BY MOUTH ONCE  DAILY (Patient taking differently: Take 5 mg by mouth at bedtime.) 90 tablet 2   Ascorbic Acid (VITAMIN C PO) Take 1 lozenge by mouth daily as needed (immune support).     Cyanocobalamin (VITAMIN B-12 IJ) Inject 1 Dose as directed every 30 (thirty) days.     cyclobenzaprine (FLEXERIL) 5 MG tablet Take 1 tablet by mouth every 8 hours as needed for muscle spasm 40 tablet 0   diphenhydrAMINE (BENADRYL) 25 MG tablet Take 25-50 mg by mouth every 6 (six) hours as needed for allergies.     EPINEPHrine (EPIPEN) 0.3 mg/0.3 mL SOAJ injection Inject 0.3 mLs (0.3 mg total) into the muscle once. (Patient taking differently: Inject 0.3 mg into the muscle as needed.) 1 Device 0   famotidine (PEPCID) 40 MG tablet Take 1 tablet by mouth at bedtime. Needs office visit. 90 tablet  3   gabapentin (NEURONTIN) 300 MG capsule Take 1 capsule (300 mg total) by mouth at bedtime. 90 capsule 5   loratadine (CLARITIN) 10 MG tablet Take 10 mg by mouth at bedtime.     losartan (COZAAR) 100 MG tablet TAKE 1 TABLET BY MOUTH ONCE DAILY (Patient taking differently: Take 100 mg by mouth at bedtime.) 90 tablet 1   meclizine (ANTIVERT) 25 MG tablet Take 25 mg by mouth 3 (three) times daily as needed for dizziness.     montelukast (SINGULAIR) 10 MG tablet TAKE 1 TABLET BY MOUTH EVERY EVENING 90 tablet 2   ondansetron (ZOFRAN) 4 MG tablet take 1 tablet by mouth every 8 hours as needed for nausea 30 tablet 0   ondansetron (ZOFRAN-ODT) 4 MG disintegrating tablet Dissolve 1 tablet by mouth every 8 hours as needed for nausea or vomiting. 20 tablet 0   pantoprazole (PROTONIX) 40 MG tablet Take 1 tablet by mouth daily before breakfast. 90 tablet 3   polyethylene glycol (MIRALAX / GLYCOLAX) 17 g packet Take 17 g by mouth daily.     rizatriptan (MAXALT-MLT) 10 MG disintegrating tablet Dissolve 1 tablet (10 mg total) by mouth as needed for migraine. May repeat in 2 hours if needed 9 tablet 11   tamsulosin (FLOMAX) 0.4 MG CAPS capsule Take 1 capsule  (0.4 mg total) by mouth daily. (Patient taking differently: Take 0.4 mg by mouth daily as needed (kidney stones).) 30 capsule 2   amoxicillin (AMOXIL) 500 MG capsule TAKE 4 CAPSULES BY MOUTH 1 HOUR PRIOR TO DENTAL APPOINTMENT. 12 capsule 0   benzonatate (TESSALON) 200 MG capsule Take 1 capsule by mouth 3 times a day as needed (Patient not taking: Reported on 03/19/2022) 30 capsule 0   diclofenac Sodium (VOLTAREN) 1 % GEL Apply 1 Application topically 4 (four) times daily as needed (pain).     Fremanezumab-vfrm (AJOVY) 225 MG/1.5ML SOAJ Inject 1.5 mLs into the skin every 30 (thirty) days. 4.5 mL 3   guaiFENesin-codeine (ROBITUSSIN AC) 100-10 MG/5ML syrup Take 10 MLs by mouth  every 6 hours as needed (Patient not taking: Reported on 03/19/2022) 240 mL 0   hyoscyamine (LEVSIN SL) 0.125 MG SL tablet Dissolve 1 tablet under the tongue 3 times daily before meals. 90 tablet 1   Na Sulfate-K Sulfate-Mg Sulf (SUPREP BOWEL PREP KIT) 17.5-3.13-1.6 GM/177ML SOLN Take 1 kit by mouth as directed. (Patient not taking: Reported on 03/11/2022) 354 mL 0   vedolizumab (ENTYVIO) 300 MG injection Inject 300 mg into the vein every 8 (eight) weeks.      No results found for this or any previous visit (from the past 48 hour(s)). No results found.  Review of Systems  Constitutional: Negative.   HENT: Negative.    Eyes: Negative.   Respiratory: Negative.    Cardiovascular: Negative.   Gastrointestinal:  Positive for abdominal pain, constipation and diarrhea.  Endocrine: Negative.   Genitourinary: Negative.   Musculoskeletal: Negative.   Allergic/Immunologic: Negative.   Neurological: Negative.     Blood pressure 117/79, pulse 88, temperature 98 F (36.7 C), temperature source Oral, resp. rate 16, SpO2 99 %. Physical Exam  GENERAL: The patient is AO x3, in no acute distress. HEENT: Head is normocephalic and atraumatic. EOMI are intact. Mouth is well hydrated and without lesions. NECK: Supple. No masses LUNGS:  Clear to auscultation. No presence of rhonchi/wheezing/rales. Adequate chest expansion HEART: RRR, normal s1 and s2. ABDOMEN: Soft, nontender, no guarding, no peritoneal signs, and nondistended. BS +. No  masses. EXTREMITIES: Without any cyanosis, clubbing, rash, lesions or edema. NEUROLOGIC: AOx3, no focal motor deficit. SKIN: no jaundice, no rashes  Assessment/Plan [name] is a 55 y.o.  female with past medical history of asthma, small bowel Crohn's disease, GERD, hypertension, obesity, diverticulosis and multiple episodes diverticulitis, coming for follow up of Crohn's disease, diarrhea, abdominal pain and bloating.  We will proceed with EGD and colonoscopy.  Harvel Quale, MD 03/25/2022, 7:39 AM

## 2022-03-25 NOTE — Anesthesia Procedure Notes (Signed)
Date/Time: 03/25/2022 8:53 AM  Performed by: Orlie Dakin, CRNAPre-anesthesia Checklist: Patient identified, Emergency Drugs available, Suction available and Patient being monitored Patient Re-evaluated:Patient Re-evaluated prior to induction Oxygen Delivery Method: Nasal cannula Induction Type: IV induction Placement Confirmation: positive ETCO2

## 2022-03-25 NOTE — Discharge Instructions (Addendum)
You are being discharged to home.  Resume your previous diet.  We are waiting for your pathology results.  Continue your present medications.  Your physician has recommended a repeat colonoscopy in five years for surveillance.  Continue Entyvio every 8 weeks. Check Entyvio levels prior to next dose. Continue Levsin as needed for abdominal pain.

## 2022-03-25 NOTE — Transfer of Care (Signed)
Immediate Anesthesia Transfer of Care Note  Patient: [name]  Procedure(s) Performed: COLONOSCOPY WITH PROPOFOL ESOPHAGOGASTRODUODENOSCOPY (EGD) WITH PROPOFOL BIOPSY POLYPECTOMY  Patient Location: Short Stay  Anesthesia Type:General  Level of Consciousness: awake  Airway & Oxygen Therapy: Patient Spontanous Breathing  Post-op Assessment: Report given to RN and Post -op Vital signs reviewed and stable  Post vital signs: Reviewed and stable  Last Vitals:  Vitals Value Taken Time  BP    Temp    Pulse    Resp    SpO2      Last Pain:  Vitals:   03/25/22 0722  TempSrc: Oral  PainSc: 0-No pain         Complications: No notable events documented.

## 2022-03-25 NOTE — Anesthesia Preprocedure Evaluation (Signed)
Anesthesia Evaluation  Patient identified by MRN, date of birth, ID band Patient awake    Reviewed: Allergy & Precautions, NPO status , Patient's Chart, lab work & pertinent test results  History of Anesthesia Complications (+) PONV and history of anesthetic complications  Airway Mallampati: II  TM Distance: >3 FB Neck ROM: Full    Dental  (+) Dental Advisory Given, Missing   Pulmonary shortness of breath and with exertion, asthma , sleep apnea , pneumonia,    Pulmonary exam normal breath sounds clear to auscultation       Cardiovascular Exercise Tolerance: Good hypertension, Pt. on medications Normal cardiovascular exam Rhythm:Regular Rate:Normal     Neuro/Psych  Headaches (idiopathic intracranial HTN), PSYCHIATRIC DISORDERS Anxiety  Neuromuscular disease    GI/Hepatic Neg liver ROS, hiatal hernia, GERD  Medicated,  Endo/Other  negative endocrine ROS  Renal/GU negative Renal ROS  negative genitourinary   Musculoskeletal  (+) Arthritis ,   Abdominal   Peds negative pediatric ROS (+)  Hematology  (+) Blood dyscrasia, anemia ,   Anesthesia Other Findings VISION PROBLEMS from ICP  Reproductive/Obstetrics negative OB ROS                            Anesthesia Physical Anesthesia Plan  ASA: 3  Anesthesia Plan: General   Post-op Pain Management: Minimal or no pain anticipated   Induction: Intravenous  PONV Risk Score and Plan: Propofol infusion  Airway Management Planned: Nasal Cannula and Natural Airway  Additional Equipment:   Intra-op Plan:   Post-operative Plan:   Informed Consent: I have reviewed the patients History and Physical, chart, labs and discussed the procedure including the risks, benefits and alternatives for the proposed anesthesia with the patient or authorized representative who has indicated his/her understanding and acceptance.     Dental advisory  given  Plan Discussed with: CRNA and Surgeon  Anesthesia Plan Comments:        Anesthesia Quick Evaluation

## 2022-03-26 ENCOUNTER — Encounter (INDEPENDENT_AMBULATORY_CARE_PROVIDER_SITE_OTHER): Payer: Self-pay | Admitting: *Deleted

## 2022-03-26 LAB — SURGICAL PATHOLOGY

## 2022-03-28 ENCOUNTER — Other Ambulatory Visit (HOSPITAL_COMMUNITY): Payer: Self-pay

## 2022-04-01 ENCOUNTER — Other Ambulatory Visit (HOSPITAL_COMMUNITY): Payer: Self-pay

## 2022-04-01 ENCOUNTER — Encounter (HOSPITAL_COMMUNITY): Payer: Self-pay | Admitting: Gastroenterology

## 2022-04-01 ENCOUNTER — Telehealth (INDEPENDENT_AMBULATORY_CARE_PROVIDER_SITE_OTHER): Payer: Self-pay

## 2022-04-01 ENCOUNTER — Other Ambulatory Visit (INDEPENDENT_AMBULATORY_CARE_PROVIDER_SITE_OTHER): Payer: Self-pay | Admitting: Gastroenterology

## 2022-04-01 DIAGNOSIS — R110 Nausea: Secondary | ICD-10-CM

## 2022-04-01 DIAGNOSIS — K50019 Crohn's disease of small intestine with unspecified complications: Secondary | ICD-10-CM | POA: Diagnosis not present

## 2022-04-01 MED ORDER — ONDANSETRON 4 MG PO TBDP
4.0000 mg | ORAL_TABLET | Freq: Three times a day (TID) | ORAL | 1 refills | Status: DC | PRN
  Filled 2022-04-01 – 2022-08-28 (×2): qty 90, 30d supply, fill #0

## 2022-04-01 NOTE — Telephone Encounter (Signed)
Patient calling today saying she had a tcs and egd on 03/25/2022 (found more diverticula) She thinks they are angry. She says she has had some severe stomach cramping that radiates to back yesterday along with 6-7 loose bm's. She says it is some better today but still hurting. She has been taking Levsin up to three times per day, zofran, and tylenol to help with the pain. She will need more zofran sent to [facility]. She says she wanted to call today to see if you needed to add to her blood work today as she is due for Vedolizumab & anti- vedo- AB, CRP,CMP,CBC, and did not know if you wanted to add anything else to this. She is due for [facility] tomorrow.  # 1 Needs disintegrating ondansetron sent to [facility].  # 2 Does she need any other blood work added to today's labs?   # 3 Any recommendations for pain other than what she is currently.

## 2022-04-01 NOTE — Telephone Encounter (Signed)
I spoke to the patient regarding her symptoms, it is possible she has some overlapping IBS symptoms which have mildly improved with the use of Levsin.  We will follow her blood work-up performed today.  If she proceeds with abdominal pain we may need to repeat a CT of the abdomen and pelvis with IV contrast.  We will refill Zofran.

## 2022-04-02 ENCOUNTER — Other Ambulatory Visit (HOSPITAL_COMMUNITY): Payer: Self-pay

## 2022-04-02 DIAGNOSIS — K5000 Crohn's disease of small intestine without complications: Secondary | ICD-10-CM | POA: Diagnosis not present

## 2022-04-06 LAB — SERIAL MONITORING

## 2022-04-09 LAB — C-REACTIVE PROTEIN: CRP: 3 mg/L (ref 0–10)

## 2022-04-09 LAB — CBC WITH DIFFERENTIAL/PLATELET
Basophils Absolute: 0 10*3/uL (ref 0.0–0.2)
Basos: 1 %
EOS (ABSOLUTE): 0.1 10*3/uL (ref 0.0–0.4)
Eos: 2 %
Hematocrit: 39.5 % (ref 34.0–46.6)
Hemoglobin: 12.9 g/dL (ref 11.1–15.9)
Immature Grans (Abs): 0 10*3/uL (ref 0.0–0.1)
Immature Granulocytes: 0 %
Lymphocytes Absolute: 1.7 10*3/uL (ref 0.7–3.1)
Lymphs: 33 %
MCH: 28.5 pg (ref 26.6–33.0)
MCHC: 32.7 g/dL (ref 31.5–35.7)
MCV: 87 fL (ref 79–97)
Monocytes Absolute: 0.4 10*3/uL (ref 0.1–0.9)
Monocytes: 8 %
Neutrophils Absolute: 2.9 10*3/uL (ref 1.4–7.0)
Neutrophils: 56 %
Platelets: 362 10*3/uL (ref 150–450)
RBC: 4.53 x10E6/uL (ref 3.77–5.28)
RDW: 13.2 % (ref 11.7–15.4)
WBC: 5.1 10*3/uL (ref 3.4–10.8)

## 2022-04-09 LAB — COMPREHENSIVE METABOLIC PANEL
ALT: 21 IU/L (ref 0–32)
AST: 20 IU/L (ref 0–40)
Albumin/Globulin Ratio: 1.8 (ref 1.2–2.2)
Albumin: 4.6 g/dL (ref 3.8–4.9)
Alkaline Phosphatase: 66 IU/L (ref 44–121)
BUN/Creatinine Ratio: 13 (ref 9–23)
BUN: 14 mg/dL (ref 6–24)
Bilirubin Total: 0.4 mg/dL (ref 0.0–1.2)
CO2: 23 mmol/L (ref 20–29)
Calcium: 9.6 mg/dL (ref 8.7–10.2)
Chloride: 106 mmol/L (ref 96–106)
Creatinine, Ser: 1.08 mg/dL — ABNORMAL HIGH (ref 0.57–1.00)
Globulin, Total: 2.6 g/dL (ref 1.5–4.5)
Glucose: 97 mg/dL (ref 70–99)
Potassium: 4 mmol/L (ref 3.5–5.2)
Sodium: 144 mmol/L (ref 134–144)
Total Protein: 7.2 g/dL (ref 6.0–8.5)
eGFR: 61 mL/min/{1.73_m2} (ref >59)

## 2022-04-09 LAB — VEDOLIZUMAB AND ANTI-VEDO AB
Anti-Vedolizumab Antibody: <25 ng/mL
Vedolizumab: 17 ug/mL

## 2022-04-11 ENCOUNTER — Other Ambulatory Visit (HOSPITAL_COMMUNITY): Payer: Self-pay

## 2022-04-15 ENCOUNTER — Other Ambulatory Visit (HOSPITAL_COMMUNITY): Payer: Self-pay

## 2022-04-27 ENCOUNTER — Other Ambulatory Visit: Payer: Self-pay | Admitting: Orthopedic Surgery

## 2022-04-27 ENCOUNTER — Other Ambulatory Visit (HOSPITAL_COMMUNITY): Payer: Self-pay

## 2022-04-27 ENCOUNTER — Ambulatory Visit (INDEPENDENT_AMBULATORY_CARE_PROVIDER_SITE_OTHER): Payer: 59 | Admitting: Gastroenterology

## 2022-04-27 ENCOUNTER — Encounter (INDEPENDENT_AMBULATORY_CARE_PROVIDER_SITE_OTHER): Payer: Self-pay | Admitting: Gastroenterology

## 2022-04-27 VITALS — BP 122/82 | HR 76 | Temp 98.6°F | Ht 71.5 in | Wt 259.5 lb

## 2022-04-27 DIAGNOSIS — R110 Nausea: Secondary | ICD-10-CM | POA: Diagnosis not present

## 2022-04-27 DIAGNOSIS — R1011 Right upper quadrant pain: Secondary | ICD-10-CM

## 2022-04-27 DIAGNOSIS — R1031 Right lower quadrant pain: Secondary | ICD-10-CM | POA: Insufficient documentation

## 2022-04-27 DIAGNOSIS — M5431 Sciatica, right side: Secondary | ICD-10-CM

## 2022-04-27 DIAGNOSIS — K5000 Crohn's disease of small intestine without complications: Secondary | ICD-10-CM

## 2022-04-27 DIAGNOSIS — R195 Other fecal abnormalities: Secondary | ICD-10-CM | POA: Diagnosis not present

## 2022-04-27 MED ORDER — GABAPENTIN 300 MG PO CAPS
300.0000 mg | ORAL_CAPSULE | Freq: Every day | ORAL | 5 refills | Status: DC
  Filled 2022-04-27: qty 90, 90d supply, fill #0
  Filled 2022-07-22: qty 90, 90d supply, fill #1
  Filled 2022-12-27: qty 90, 90d supply, fill #2
  Filled 2023-04-19: qty 90, 90d supply, fill #3

## 2022-04-27 MED ORDER — CYCLOBENZAPRINE HCL 5 MG PO TABS
ORAL_TABLET | ORAL | 0 refills | Status: DC
  Filled 2022-04-27: qty 40, 14d supply, fill #0

## 2022-04-27 NOTE — Patient Instructions (Signed)
It was so nice to see you! We will continue Entyvio every 8 weeks as you are currently doing Overall endoscopic evaluation and labs all look very reassuring in regards to your Crohn's, however, if you continue to have Right sided pain, we may need to do further evaluation with imaging. Please let me know if this persists, worsens or you have any associated symptoms with it. You can continue zofran and levsin as needed If you are having more acid reflux symptoms, please make me aware  Follow up 3 months

## 2022-04-27 NOTE — Progress Notes (Unsigned)
Referring Provider: Sharilyn Sites, MD Primary Care Physician:  Sharilyn Sites, MD Primary GI Physician: Jenetta Downer  Chief Complaint  Patient presents with   Crohn's Disease    Follow up on Crohn's. Taking entyvio infusion every 8 weeks. Still having some pain on right lower side. Having nausea and diarrhea. Stools are thin pencil like.    HPI:   [name] is a 56 y.o. female with past medical history of asthma, small bowel Crohn's disease, GERD, hypertension, obesity, diverticulosis and multiple episodes diverticulitis  Patient presenting today for Crohn's disease.  Last seen 01/26/22, at that time she had received 3 doses of Entyvio in December-January, with last dose in March 9th/2023 (fourth dose). felt some worsening of her symptoms since starting the medication as she had more episodes of cramping in the RLQ. noticed some improvement in her BM frequency as she has 3 nonbloody Bms per day - with string like shape . She states that for the last 2 months she started having significant tenesmus and bloating but could not defecate with easiness. She is taking Levsin 3 times a day and is concerned that the medication may have led to slowing of her bowels. She also reports having intermittent episodes of LLQ pain as well and is concerned about diverticulitis. Has not taken Imodium recently.having  recurrent nausea and eating less than usual as she feels full easily. Denies vomiting. Uses SL Zofran as needed for nausea, possibly 1-2 times a week. Has noticed some orange stool but is not sure if this is blood. Advised to Continue Entyvio every 8 weeks.Check CBC, CMP, CRP, Entyvio levels before sixth dose of Entyvio (around June 28th), Schedule EGD and colonoscopy in late June, Decrease intake of Levsin as needed for abdominal pain.   Fecal calprotectin in feb 2023 was 50   Labs on 04/01/22 with CBC WNL, CMP with creatnine slightly bumped to 1.08. CRP 3, vedolizumab levels (17)therapeutic,  contineued on Q8 week dosing of entyvio  Present:  Doing well on Entyvio, though she feels that her "gut is in a constant tug of war." She has intermittent abdominal pain, more on the RLQ, sometimes right mid abdomen. Previously she felt that she could really feel her food passing through her system and had a lot of discomfort with walking but this has improved quite a bit. She is having on average 3-4 BMs per day, most days, previously with 6-8 BMs per day every day, though sometimes she can still have this many but less often. She sometimes may have 2-3 days where she may not have any BMs at all, stools are still very pencil thin. On the days she is having more formed stools, she almost feels more constipated and like she has to strain some, though stools do not seem to be that hard. She tends to have nausea when she feels more constipated. She is taking Levsin as needed, she does not take it everyday but when she does take it, she will usually repeat it up to 3-4x in a day, maybe 3 out of 7 days. She is using zofran during these times as well. She does note that she had 2 episodes of acid regurgitation last week that awoke her from sleep. She denies any changes in diet or eating later prior to bed. Appetite is up and down. She is starting an exercise group at her church tomorrow. She has had no blood in stools or black stools.    Last Colonoscopy:03/25/22- One 4 mm polyp  in the cecum, removed with a cold snare.tubular adenoma  - Melanosis in the colon. - Diverticulosis in the entire examined colon. - The distal rectum and anal verge are normal on retroflexion view. Last Endoscopy:03/25/22- 3 cm hiatal hernia. - Non-obstructing Schatzki ring. - Normal stomach. - Normal examined Normal examined duodenum, biopsied-mild inflammation  Recommendations:  Repeat colon in 5 years  Past Medical History:  Diagnosis Date   Anxiety    Arthritis    Asthma    Cervical hyperplasia    hisotry of   Chest  pain    cath 2005 norm cors, repeat cath Feb 2011 normal cors and only minimally  elevated pulmonary pressures   Crohn disease ([place])    Diverticulitis of colon 07/17/2014   Diverticulosis    Dyspnea    GERD (gastroesophageal reflux disease)    Hiatal hernia    History of iron deficiency anemia    Hypertension    Idiopathic intracranial hypertension    Per patient   Migraines    Obesity    Palpitations    Pneumonia 2008   14 days hospitalization, bilateral   PONV (postoperative nausea and vomiting)    Sleep apnea sleep study 11/03/2009    cpap; does sometimes, doesnt use every night. weight loss no longer using CPAP   Vitamin B deficiency     Past Surgical History:  Procedure Laterality Date   ABDOMINAL HYSTERECTOMY     ANKLE ARTHROSCOPY  06/01/2012   Procedure: ANKLE ARTHROSCOPY;  Surgeon: Colin Rhein, MD;  Location: [place];  Service: Orthopedics;  Laterality: Left;  left ankle arthorsocpy with extensive debridement and gastroc slide   ANKLE SURGERY Left 05/11/2018   BIOPSY  04/26/2019   Procedure: BIOPSY;  Surgeon: Rogene Houston, MD;  [facility];  Service: Endoscopy;;  ileum colon   BIOPSY  03/25/2022   Procedure: BIOPSY;  Surgeon: Harvel Quale, MD;  [facility];  Service: Gastroenterology;;   CARDIAC CATHETERIZATION  11/22/2009 and 2005   WNL   CESAREAN SECTION     CHOLECYSTECTOMY  09/08/2006   lap. chole.   CHONDROPLASTY  06/17/2012   Procedure: CHONDROPLASTY;  Surgeon: Carole Civil, MD;  Location: [facility];  Service: Orthopedics;  Laterality: Right;  right patella   COLONOSCOPY N/A 04/26/2019   Rehman: a few erosions in ternimal ileum, two erosions in cecum (assocaited with acute inflammation) diverticulosis in sigmoid hepatic flexure and ascending colon, external hemorrhoids   COLONOSCOPY WITH PROPOFOL N/A 03/25/2022   Procedure: COLONOSCOPY WITH PROPOFOL;  Surgeon: Harvel Quale, MD;   [facility];  Service: Gastroenterology;  Laterality: N/A;  830 ASA 1   ESOPHAGOGASTRODUODENOSCOPY N/A 05/14/2015   Procedure: ESOPHAGOGASTRODUODENOSCOPY (EGD);  Surgeon: Rogene Houston, MD;  [facility];  Service: Endoscopy;  Laterality: N/A;  730   ESOPHAGOGASTRODUODENOSCOPY (EGD) WITH PROPOFOL N/A 03/25/2022   Procedure: ESOPHAGOGASTRODUODENOSCOPY (EGD) WITH PROPOFOL;  Surgeon: Harvel Quale, MD;  [facility];  Service: Gastroenterology;  Laterality: N/A;   GIVENS CAPSULE STUDY N/A 05/24/2019   Procedure: GIVENS CAPSULE STUDY;  Surgeon: Rogene Houston, MD;  [facility];  Service: Endoscopy;  Laterality: N/A;  7:30   INGUINAL HERNIA REPAIR  10/30/2008   right   KNEE ARTHROSCOPY Right 2013   KNEE ARTHROSCOPY WITH LATERAL MENISECTOMY Left 12/23/2016   Procedure: LEFT KNEE ARTHROSCOPY WITH LATERAL MENISECTOMY;  Surgeon: Carole Civil, MD;  [facility];  Service: Orthopedics;  Laterality: Left;   KNEE ARTHROSCOPY WITH MEDIAL MENISECTOMY Left 12/23/2016   Procedure: LEFT KNEE ARTHROSCOPY WITH MEDIAL MENISECTOMY CHONDROPLASTY PATELLA  AND MEDIAL FEMORAL CONDYLE LEFT KNEE;  Surgeon: Carole Civil, MD;  [facility];  Service: Orthopedics;  Laterality: Left;   POLYPECTOMY  03/25/2022   Procedure: POLYPECTOMY;  Surgeon: Harvel Quale, MD;  [facility];  Service: Gastroenterology;;   SHOULDER ARTHROSCOPY WITH SUBACROMIAL DECOMPRESSION AND BICEP TENDON REPAIR Left 12/21/2017   Procedure: Left shoulder arthroscopic biceps tenodesis, SAD, DCR and labrum debridement;  Surgeon: Nicholes Stairs, MD;  Location: [place];  Service: Orthopedics;  Laterality: Left;  120 mins   SHOULDER SURGERY     right x 2    TOTAL KNEE ARTHROPLASTY Left 06/06/2019   Procedure: TOTAL KNEE ARTHROPLASTY;  Surgeon: Paralee Cancel, MD;  Location: [facility];  Service: Orthopedics;  Laterality: Left;  70 mins   TOTAL KNEE  ARTHROPLASTY Right 01/23/2021   Procedure: TOTAL KNEE ARTHROPLASTY;  Surgeon: Paralee Cancel, MD;  Location: [facility];  Service: Orthopedics;  Laterality: Right;  70 mins    Current Outpatient Medications  Medication Sig Dispense Refill   acetaminophen (TYLENOL) 500 MG tablet Take 2 tablets (1,000 mg total) by mouth every 8 (eight) hours. (Patient taking differently: Take 1,000 mg by mouth every 8 (eight) hours as needed for moderate pain.) 30 tablet 0   acetaZOLAMIDE ER (DIAMOX) 500 MG capsule Take 1 capsule by mouth 3 times daily. (Patient taking differently: Take 500-1,000 mg by mouth See admin instructions. Take 500 mg in the morning and 1000 mg at night) 270 capsule 3   albuterol (PROVENTIL) (2.5 MG/3ML) 0.083% nebulizer solution Inhale 1 vial via nebulizer every 6 hours as needed 90 mL 1   albuterol (VENTOLIN HFA) 108 (90 Base) MCG/ACT inhaler Inhale 1-2 puffs by mouth every 4 hours as needed 18 g 2   amitriptyline (ELAVIL) 25 MG tablet TAKE 1 TABLET BY MOUTH AT BEDTIME 90 tablet 3   amLODipine (NORVASC) 5 MG tablet TAKE 1 TABLET BY MOUTH ONCE DAILY (Patient taking differently: Take 5 mg by mouth at bedtime.) 90 tablet 2   amoxicillin (AMOXIL) 500 MG capsule TAKE 4 CAPSULES BY MOUTH 1 HOUR PRIOR TO DENTAL APPOINTMENT. 12 capsule 0   Ascorbic Acid (VITAMIN C PO) Take 1 lozenge by mouth daily as needed (immune support).     Cyanocobalamin (VITAMIN B-12 IJ) Inject 1 Dose as directed every 30 (thirty) days.     cyclobenzaprine (FLEXERIL) 5 MG tablet Take 1 tablet by mouth every 8 hours as needed for muscle spasm 40 tablet 0   diclofenac Sodium (VOLTAREN) 1 % GEL Apply 1 Application topically 4 (four) times daily as needed (pain).     diphenhydrAMINE (BENADRYL) 25 MG tablet Take 25-50 mg by mouth every 6 (six) hours as needed for allergies.     EPINEPHrine (EPIPEN) 0.3 mg/0.3 mL SOAJ injection Inject 0.3 mLs (0.3 mg total) into the muscle once. (Patient taking differently: Inject 0.3 mg into the  muscle as needed.) 1 Device 0   famotidine (PEPCID) 40 MG tablet Take 1 tablet by mouth at bedtime. Needs office visit. 90 tablet 3   gabapentin (NEURONTIN) 300 MG capsule Take 1 capsule (300 mg total) by mouth at bedtime. 90 capsule 5   guaiFENesin-codeine (ROBITUSSIN AC) 100-10 MG/5ML syrup Take 10 MLs by mouth  every 6 hours as needed 240 mL 0   hyoscyamine (LEVSIN SL) 0.125 MG SL tablet Dissolve 1 tablet under  the tongue 3 times daily before meals. 90 tablet 1   loratadine (CLARITIN) 10 MG tablet Take 10 mg by mouth at bedtime.     losartan (COZAAR) 100 MG tablet TAKE 1 TABLET BY MOUTH ONCE DAILY (Patient taking differently: Take 100 mg by mouth at bedtime.) 90 tablet 1   meclizine (ANTIVERT) 25 MG tablet Take 25 mg by mouth 3 (three) times daily as needed for dizziness.     montelukast (SINGULAIR) 10 MG tablet TAKE 1 TABLET BY MOUTH EVERY EVENING 90 tablet 2   ondansetron (ZOFRAN-ODT) 4 MG disintegrating tablet Dissolve 1 tablet by mouth every 8 hours as needed for nausea or vomiting. 90 tablet 1   pantoprazole (PROTONIX) 40 MG tablet Take 1 tablet by mouth daily before breakfast. 90 tablet 3   rizatriptan (MAXALT-MLT) 10 MG disintegrating tablet Dissolve 1 tablet (10 mg total) by mouth as needed for migraine. May repeat in 2 hours if needed 9 tablet 11   tamsulosin (FLOMAX) 0.4 MG CAPS capsule Take 1 capsule (0.4 mg total) by mouth daily. (Patient taking differently: Take 0.4 mg by mouth daily as needed (kidney stones).) 30 capsule 2   vedolizumab (ENTYVIO) 300 MG injection Inject 300 mg into the vein every 8 (eight) weeks.     Fremanezumab-vfrm (AJOVY) 225 MG/1.5ML SOAJ Inject 1.5 mLs into the skin every 30 (thirty) days. (Patient not taking: Reported on 04/27/2022) 4.5 mL 3   polyethylene glycol (MIRALAX / GLYCOLAX) 17 g packet Take 17 g by mouth daily. (Patient not taking: Reported on 04/27/2022)     No current facility-administered medications for this visit.    Allergies as of 04/27/2022  - Review Complete 04/27/2022  Allergen Reaction Noted   Iodinated contrast media Anaphylaxis and Hives    Other Shortness Of Breath 05/28/2012   Wasp venom Anaphylaxis and Shortness Of Breath 08/06/2011   Pollen extract Swelling 05/28/2012   Adhesive [tape] Rash 06/03/2012   Hydromorphone hcl Nausea And Vomiting 08/13/2009   Omnipaque [iohexol] Hives and Nausea Only 01/02/2011    Family History  Problem Relation Age of Onset   Hypertension Mother    Atrial fibrillation Mother    Arthritis Mother    Lupus Father    COPD Father    Rheum arthritis Father    Sarcoidosis Father    Lupus Sister    Congestive Heart Failure Maternal Grandmother    Migraines Neg Hx    Headache Neg Hx     Social History   Socioeconomic History   Marital status: Married    Spouse name: Not on file   Number of children: 4   Years of education: Not on file   Highest education level: Not on file  Occupational History    Employer: [employer]    Comment: corporate safety  Tobacco Use   Smoking status: Never    Passive exposure: Never   Smokeless tobacco: Never  Vaping Use   Vaping Use: Never used  Substance and Sexual Activity   Alcohol use: No    Alcohol/week: 0.0 standard drinks of alcohol   Drug use: Never   Sexual activity: Yes    Birth control/protection: Surgical  Other Topics Concern   Not on file  Social History Narrative   Lives with husband and oldest son    Caffeine use: about 3 cups of coffee/day   She works in Chief Operating Officer with [employer]   She has 4 children age 35-22.     Social Determinants of Health  Financial Resource Strain: Not on file  Food Insecurity: Not on file  Transportation Needs: Not on file  Physical Activity: Not on file  Stress: Not on file  Social Connections: Not on file   Review of systems General: negative for malaise, night sweats, fever, chills, weight loss Neck: Negative for lumps, goiter, pain and significant neck swelling Resp:  Negative for cough, wheezing, dyspnea at rest CV: Negative for chest pain, leg swelling, palpitations, orthopnea GI: denies melena, hematochezia, nausea, vomiting, diarrhea, constipation, dysphagia, odyonophagia, early satiety or unintentional weight loss. +RLQ pain +skinny stools MSK: Negative for joint pain or swelling, back pain, and muscle pain. Derm: Negative for itching or rash Psych: Denies depression, anxiety, memory loss, confusion. No homicidal or suicidal ideation.  Heme: Negative for prolonged bleeding, bruising easily, and swollen nodes. Endocrine: Negative for cold or heat intolerance, polyuria, polydipsia and goiter. Neuro: negative for tremor, gait imbalance, syncope and seizures. The remainder of the review of systems is noncontributory.  Physical Exam: BP 122/82 (BP Location: Right Arm, Patient Position: Sitting, Cuff Size: Large)   Pulse 76   Temp 98.6 F (37 C) (Oral)   Ht 5' 11.5" (1.816 m)   Wt 259 lb 8 oz (117.7 kg)   BMI 35.69 kg/m  General:   Alert and oriented. No distress noted. Pleasant and cooperative.  Head:  Normocephalic and atraumatic. Eyes:  Conjuctiva clear without scleral icterus. Mouth:  Oral mucosa pink and moist. Good dentition. No lesions. Heart: Normal rate and rhythm, s1 and s2 heart sounds present.  Lungs: Clear lung sounds in all lobes. Respirations equal and unlabored. Abdomen:  +BS, soft, non-tender and non-distended. No rebound or guarding. No HSM or masses noted. Derm: No palmar erythema or jaundice Msk:  Symmetrical without gross deformities. Normal posture. Extremities:  Without edema. Neurologic:  Alert and  oriented x4 Psych:  Alert and cooperative. Normal mood and affect.  Invalid input(s): "6 MONTHS"   ASSESSMENT: BASMA BUCHNER is a 56 y.o. female presenting today for follow up of Crohn's disease, maintained on Entyvio.   Doing well overall on Entyvio, feels that her BMs are less frequent, previously 6-8 daily, now with  3-4 most days, sometimes more, sometimes less. Denies weight loss, rectal bleeding, or melena. Most recent labs with normal CBC, CMP, CRP 3, vedolizumab levels (17)therapeutic. She continues to have some intermittent RLQ/Right mid abdominal pain, she is taking levsin for this as needed and has nausea at times that she uses zofran for PRN as well. No precipitating factors for the pain, sometimes even occurs if she has not eaten. Previously suspected some superimposed IBS along with her IBD.most recent EGD and Colonoscopy were with only mild inflammation of the duodenum, small hiatal hernia, diverticulosis and tubular adenomas. I discussed proceeding with imaging of her abdomen for further evaluation of her pain as she reports it has become more frequent as well as continued pencil thin stools as most recent EGD/colonoscopy were without findings to explain her narrow stools, ultimately concerning for possible stricture in distal small bowel not visualized with previous endoscopic evaluation. Patient hesitant to proceed with further imaging, however, given that the pain is becoming more frequent, she is amenable to proceeding with MRE at this time.   Two episodes of acid regurgitation, both at night, she is maintained on pantoprazole 21m once daily, she should practice reflux precautions to include avoiding greasy, spicy, fried, citrus foods, and being mindful that caffeine, carbonated drinks, chocolate and alcohol can increase reflux symptoms, should  Stay upright 2-3 hours after eating, prior to lying down and avoid eating late in the evenings. She should make me aware of further, ongoing episodes of regurgitation as PPI may need to be changed.   PLAN:  Continue Entyvio Q8 weeks 2. Repeat labs in 6 months  3. Continue zofran PRN 4. Continue Levsin PRN 5. Pt to let me know if she has recurrent episodes of acid reflux 6. MR enterography w wo A/P   All questions were answered, patient verbalized  understanding and is in agreement with plan as outlined above.    Follow Up: 3 months  Randale Carvalho L. Alver Sorrow, MSN, APRN, AGNP-C Adult-Gerontology Nurse Practitioner [facility]

## 2022-04-28 ENCOUNTER — Other Ambulatory Visit (HOSPITAL_COMMUNITY): Payer: Self-pay

## 2022-04-29 ENCOUNTER — Telehealth (INDEPENDENT_AMBULATORY_CARE_PROVIDER_SITE_OTHER): Payer: Self-pay | Admitting: Gastroenterology

## 2022-04-29 ENCOUNTER — Other Ambulatory Visit (HOSPITAL_COMMUNITY): Payer: Self-pay

## 2022-04-29 DIAGNOSIS — R1011 Right upper quadrant pain: Secondary | ICD-10-CM | POA: Insufficient documentation

## 2022-04-29 DIAGNOSIS — R195 Other fecal abnormalities: Secondary | ICD-10-CM | POA: Insufficient documentation

## 2022-04-30 ENCOUNTER — Other Ambulatory Visit (HOSPITAL_COMMUNITY): Payer: Self-pay

## 2022-04-30 ENCOUNTER — Encounter: Payer: Self-pay | Admitting: Family Medicine

## 2022-05-05 ENCOUNTER — Telehealth (INDEPENDENT_AMBULATORY_CARE_PROVIDER_SITE_OTHER): Payer: Self-pay

## 2022-05-05 ENCOUNTER — Other Ambulatory Visit (HOSPITAL_COMMUNITY): Payer: Self-pay

## 2022-05-05 MED ORDER — ALBUTEROL SULFATE HFA 108 (90 BASE) MCG/ACT IN AERS
INHALATION_SPRAY | RESPIRATORY_TRACT | 2 refills | Status: DC
  Filled 2022-05-05: qty 6.7, 16d supply, fill #0
  Filled 2022-07-22: qty 6.7, 16d supply, fill #1
  Filled 2023-04-19: qty 6.7, 16d supply, fill #2

## 2022-05-05 MED ORDER — ALBUTEROL SULFATE (2.5 MG/3ML) 0.083% IN NEBU
INHALATION_SOLUTION | RESPIRATORY_TRACT | 1 refills | Status: DC
  Filled 2022-05-05: qty 90, 7d supply, fill #0

## 2022-05-05 NOTE — Telephone Encounter (Signed)
Patient aware of all.

## 2022-05-05 NOTE — Telephone Encounter (Signed)
Faxed appeal letter to [facility] at [phone]. Marked urgent. Waiting on determination.

## 2022-05-05 NOTE — Telephone Encounter (Signed)
Will either of these migraine medications Aimovig or Qulipta cause issues or side effect for this patients Crohn's? Please advise.

## 2022-05-05 NOTE — Telephone Encounter (Signed)
Not that I'm aware of

## 2022-05-06 ENCOUNTER — Other Ambulatory Visit (HOSPITAL_COMMUNITY): Payer: Self-pay

## 2022-05-07 ENCOUNTER — Other Ambulatory Visit (HOSPITAL_COMMUNITY): Payer: Self-pay

## 2022-05-07 ENCOUNTER — Other Ambulatory Visit: Payer: Self-pay | Admitting: *Deleted

## 2022-05-07 DIAGNOSIS — G43709 Chronic migraine without aura, not intractable, without status migrainosus: Secondary | ICD-10-CM

## 2022-05-07 MED ORDER — EPINEPHRINE 0.3 MG/0.3ML IJ SOAJ
INTRAMUSCULAR | 5 refills | Status: DC
  Filled 2022-05-07: qty 2, 7d supply, fill #0
  Filled 2022-07-22: qty 2, 30d supply, fill #1

## 2022-05-07 MED ORDER — NURTEC 75 MG PO TBDP
1.0000 | ORAL_TABLET | ORAL | 11 refills | Status: DC
  Filled 2022-05-07: qty 16, 30d supply, fill #0
  Filled 2022-06-10: qty 16, 30d supply, fill #1
  Filled 2022-07-22: qty 16, 30d supply, fill #2
  Filled 2022-08-28: qty 16, 30d supply, fill #3

## 2022-05-07 MED ORDER — MONTELUKAST SODIUM 10 MG PO TABS
ORAL_TABLET | ORAL | 1 refills | Status: DC
  Filled 2022-05-07: qty 90, 90d supply, fill #0

## 2022-05-08 ENCOUNTER — Other Ambulatory Visit (HOSPITAL_COMMUNITY): Payer: Self-pay

## 2022-05-08 DIAGNOSIS — D511 Vitamin B12 deficiency anemia due to selective vitamin B12 malabsorption with proteinuria: Secondary | ICD-10-CM | POA: Diagnosis not present

## 2022-05-11 ENCOUNTER — Other Ambulatory Visit (HOSPITAL_COMMUNITY): Payer: Self-pay

## 2022-05-12 NOTE — Telephone Encounter (Signed)
Submitted PA [employer]ur[employer] on CMM. Key: [id number]. Waiting on determination from [place].

## 2022-05-13 ENCOUNTER — Other Ambulatory Visit (HOSPITAL_COMMUNITY): Payer: Self-pay

## 2022-05-14 NOTE — Telephone Encounter (Signed)
See last OV note

## 2022-05-15 ENCOUNTER — Other Ambulatory Visit (HOSPITAL_COMMUNITY): Payer: Self-pay

## 2022-05-20 ENCOUNTER — Encounter (HOSPITAL_COMMUNITY): Payer: Self-pay

## 2022-05-20 ENCOUNTER — Ambulatory Visit (HOSPITAL_COMMUNITY)
Admission: RE | Admit: 2022-05-20 | Discharge: 2022-05-20 | Disposition: A | Discharge status: Home / Self Care | Payer: 59 | Source: Ambulatory Visit | Attending: Gastroenterology | Admitting: Gastroenterology

## 2022-05-20 DIAGNOSIS — K5000 Crohn's disease of small intestine without complications: Secondary | ICD-10-CM | POA: Insufficient documentation

## 2022-05-20 DIAGNOSIS — R195 Other fecal abnormalities: Secondary | ICD-10-CM | POA: Insufficient documentation

## 2022-05-20 DIAGNOSIS — R1011 Right upper quadrant pain: Secondary | ICD-10-CM | POA: Insufficient documentation

## 2022-05-20 DIAGNOSIS — R1031 Right lower quadrant pain: Secondary | ICD-10-CM | POA: Insufficient documentation

## 2022-05-20 DIAGNOSIS — K5730 Diverticulosis of large intestine without perforation or abscess without bleeding: Secondary | ICD-10-CM | POA: Diagnosis not present

## 2022-05-20 DIAGNOSIS — Z90710 Acquired absence of both cervix and uterus: Secondary | ICD-10-CM | POA: Diagnosis not present

## 2022-05-20 DIAGNOSIS — R110 Nausea: Secondary | ICD-10-CM

## 2022-05-20 DIAGNOSIS — K5090 Crohn's disease, unspecified, without complications: Secondary | ICD-10-CM | POA: Diagnosis not present

## 2022-05-20 DIAGNOSIS — N281 Cyst of kidney, acquired: Secondary | ICD-10-CM | POA: Diagnosis not present

## 2022-05-20 MED ORDER — SODIUM CHLORIDE FLUSH 0.9 % IV SOLN
INTRAVENOUS | Status: AC
  Filled 2022-05-20: qty 200

## 2022-05-20 MED ORDER — BARIUM SULFATE 0.1 % PO SUSP
ORAL | Status: AC
  Filled 2022-05-20: qty 1

## 2022-05-20 MED ORDER — GLUCAGON HCL RDNA (DIAGNOSTIC) 1 MG IJ SOLR
INTRAMUSCULAR | Status: AC
  Administered 2022-05-20: 1 mg via INTRAVENOUS
  Filled 2022-05-20: qty 1

## 2022-05-20 MED ORDER — GADOBUTROL 1 MMOL/ML IV SOLN: 10.0000 mL | Freq: Once | INTRAVENOUS | Status: DC | PRN

## 2022-05-20 MED ORDER — GADOBUTROL 1 MMOL/ML IV SOLN
10.0000 mL | Freq: Once | INTRAVENOUS | Status: AC | PRN
  Administered 2022-05-20: 10 mL via INTRAVENOUS

## 2022-05-20 MED ORDER — GLUCAGON HCL RDNA (DIAGNOSTIC) 1 MG IJ SOLR: 1.0000 mg | Freq: Once | INTRAMUSCULAR | Status: AC

## 2022-05-22 ENCOUNTER — Other Ambulatory Visit (HOSPITAL_COMMUNITY): Payer: Self-pay

## 2022-05-28 ENCOUNTER — Other Ambulatory Visit (INDEPENDENT_AMBULATORY_CARE_PROVIDER_SITE_OTHER): Payer: Self-pay | Admitting: Gastroenterology

## 2022-05-28 MED ORDER — OMEPRAZOLE 40 MG PO CPDR
40.0000 mg | DELAYED_RELEASE_CAPSULE | Freq: Every day | ORAL | 1 refills | Status: DC
  Filled 2022-05-28: qty 30, 30d supply, fill #0
  Filled 2022-07-22: qty 30, 30d supply, fill #1

## 2022-05-29 ENCOUNTER — Other Ambulatory Visit (HOSPITAL_COMMUNITY): Payer: Self-pay

## 2022-06-10 ENCOUNTER — Other Ambulatory Visit (HOSPITAL_COMMUNITY): Payer: Self-pay

## 2022-06-11 ENCOUNTER — Other Ambulatory Visit (HOSPITAL_COMMUNITY): Payer: Self-pay

## 2022-06-11 DIAGNOSIS — K5000 Crohn's disease of small intestine without complications: Secondary | ICD-10-CM | POA: Diagnosis not present

## 2022-06-11 MED ORDER — AMLODIPINE BESYLATE 5 MG PO TABS
ORAL_TABLET | Freq: Every day | ORAL | 2 refills | Status: DC
  Filled 2022-06-11: qty 90, 90d supply, fill #0
  Filled 2022-10-19: qty 90, 90d supply, fill #1
  Filled 2023-01-22 – 2023-01-27 (×2): qty 90, 90d supply, fill #2

## 2022-06-12 ENCOUNTER — Telehealth (INDEPENDENT_AMBULATORY_CARE_PROVIDER_SITE_OTHER): Payer: Self-pay | Admitting: *Deleted

## 2022-06-12 DIAGNOSIS — D511 Vitamin B12 deficiency anemia due to selective vitamin B12 malabsorption with proteinuria: Secondary | ICD-10-CM | POA: Diagnosis not present

## 2022-06-12 IMAGING — CT CT ABD-PELV W/O CM
2 of 4 series · 16 of 46 positions shown, 18 images · non-contrast
Comparison: Abdominopelvic CT 08/02/2019

CLINICAL DATA: Left lower quadrant abdominal pain for 2 days.
Diverticulitis suspected. History of Crohn's disease,
diverticulitis, inguinal hernia, cholecystectomy and hysterectomy.

EXAM:
CT ABDOMEN AND PELVIS WITHOUT CONTRAST
TECHNIQUE: Multidetector CT imaging of the abdomen and pelvis was performed
following the standard protocol without IV contrast.

[Series 2: axial st · axial · 0.88mm/px · z∈[+904,+1354]mm · 13 of 103 slices shown, 15 images]
[im 7/103  soft-tissue]
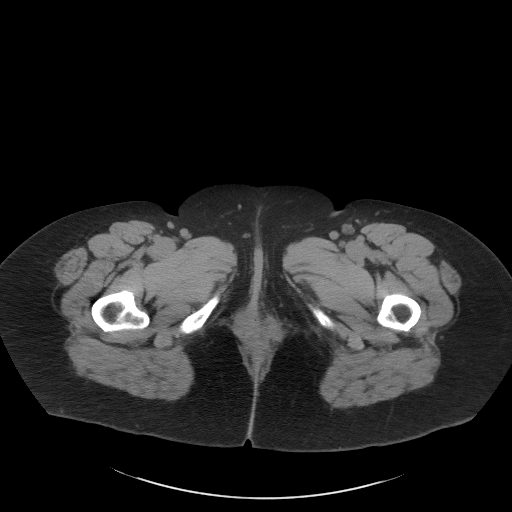
[im 7/103  bone]
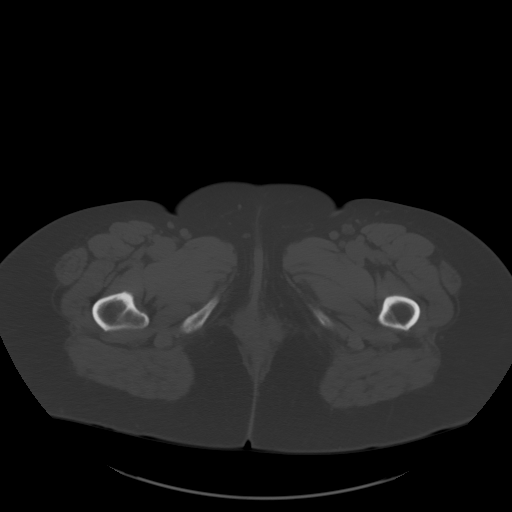
[im 13/103  soft-tissue]
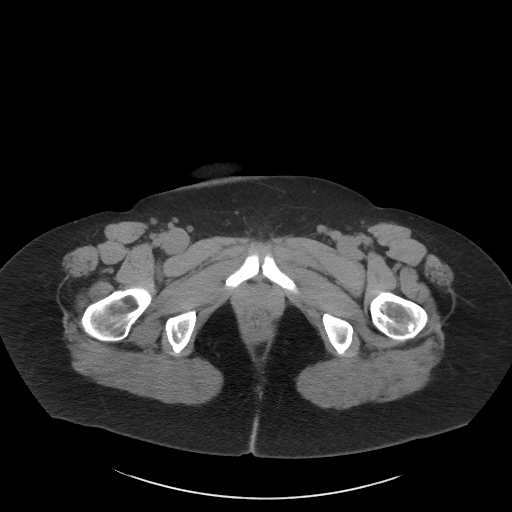
[im 25/103  soft-tissue]
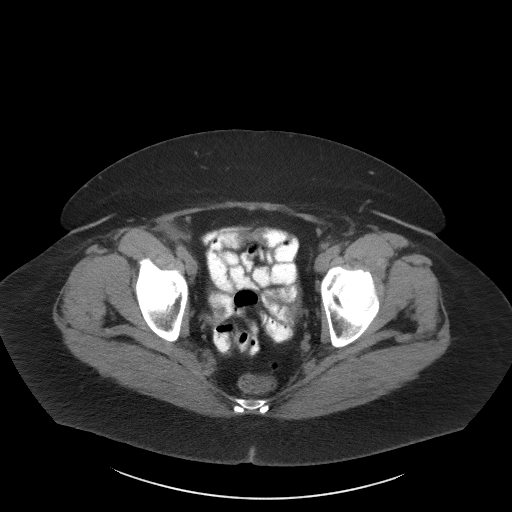
[im 31/103  soft-tissue]
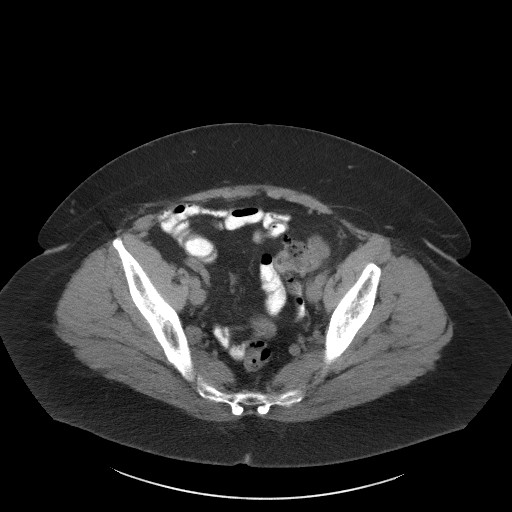
[im 37/103  soft-tissue]
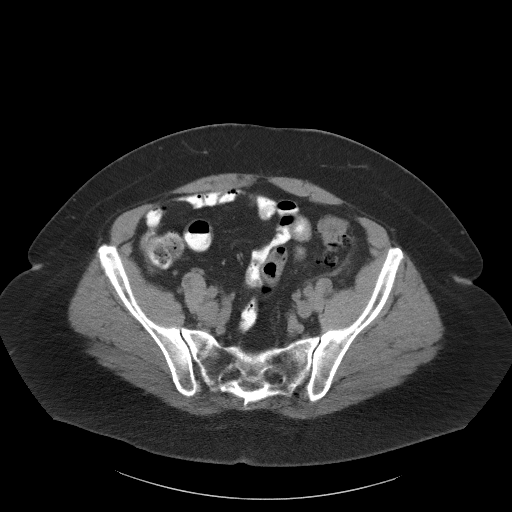
[im 43/103  soft-tissue]
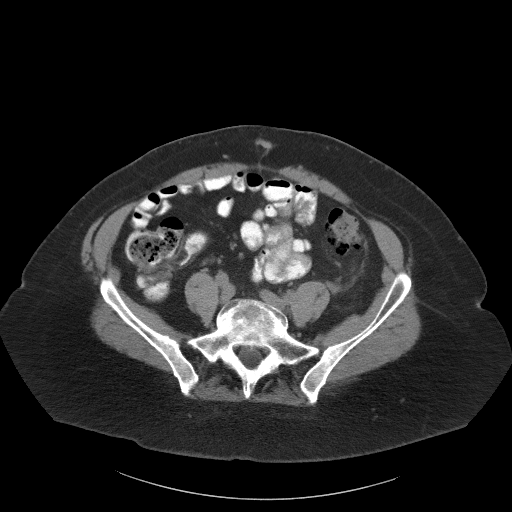
[im 55/103  soft-tissue]
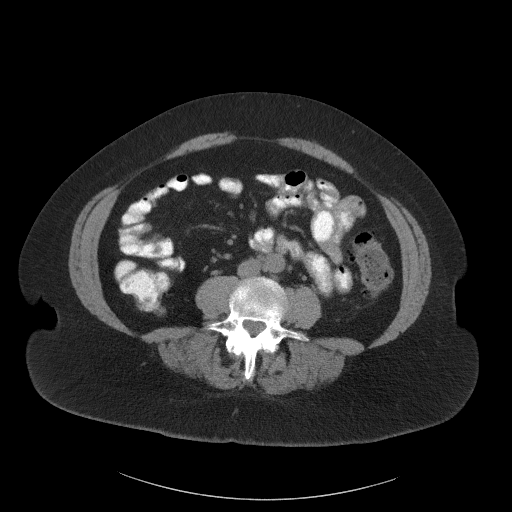
[im 61/103  soft-tissue]
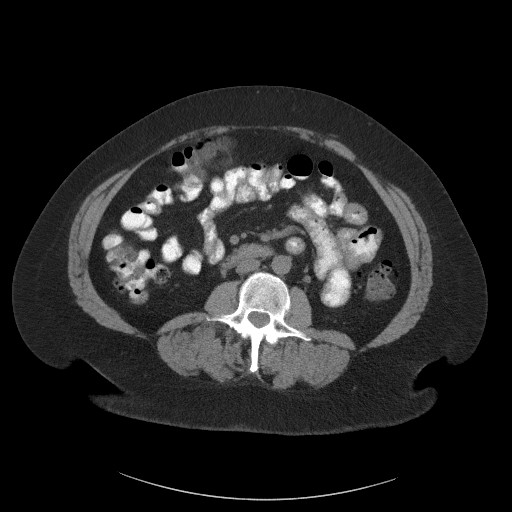
[im 67/103  soft-tissue]
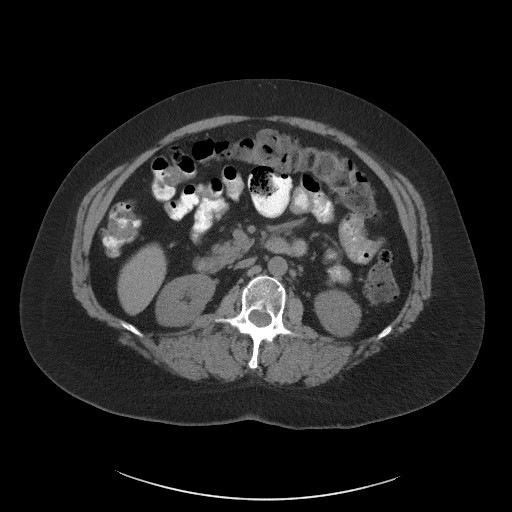
[im 67/103  bone]
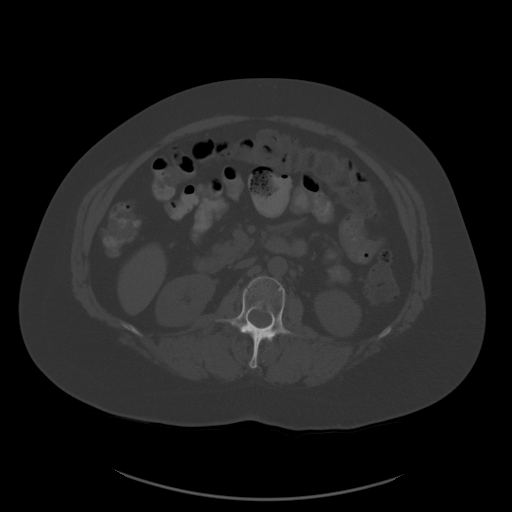
[im 73/103  soft-tissue]
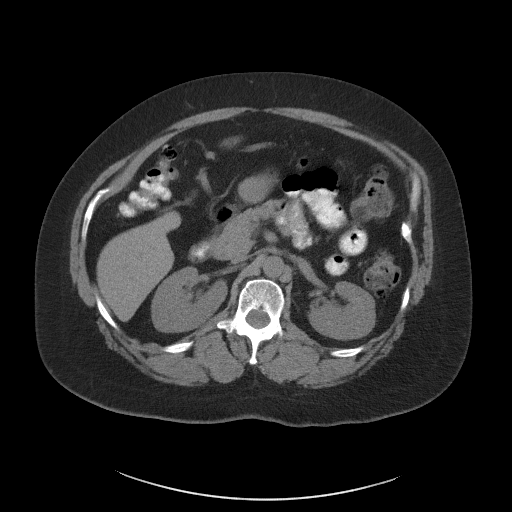
[im 79/103  soft-tissue]
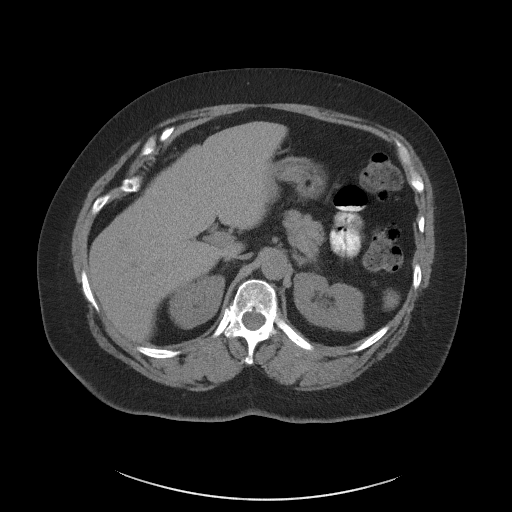
[im 91/103  soft-tissue]
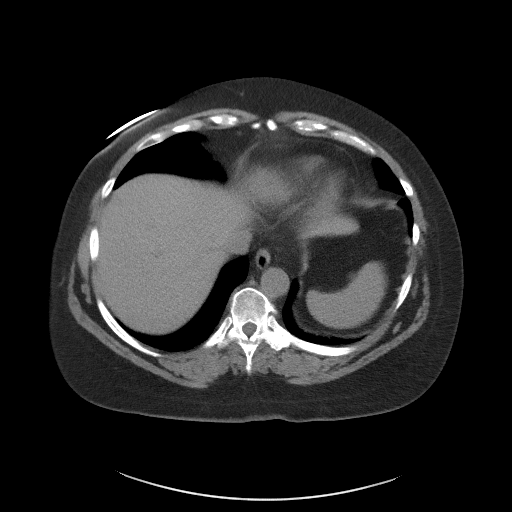
[im 97/103  soft-tissue]
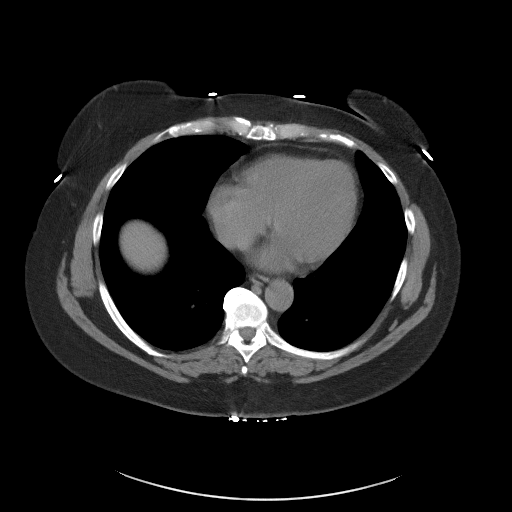

[Series 5: coronal st · coronal · 0.94mm/px · 3 of 126 slices shown]
[im 42/126  soft-tissue]
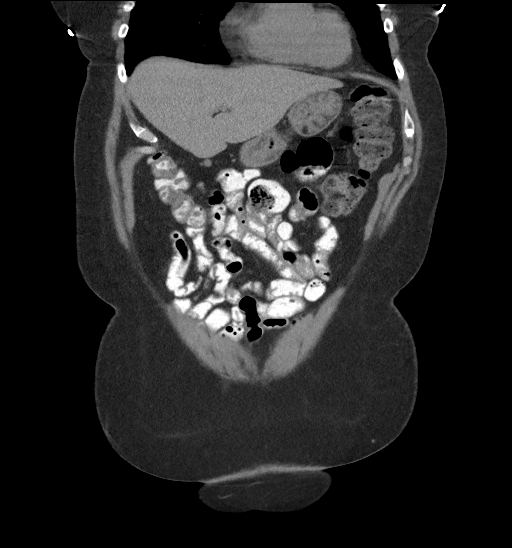
[im 56/126  soft-tissue]
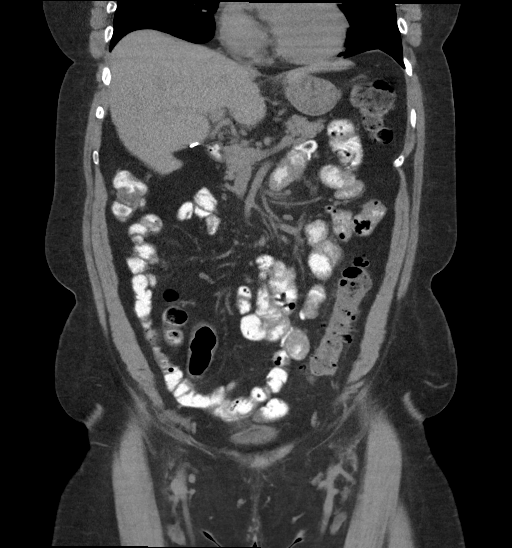
[im 70/126  soft-tissue]
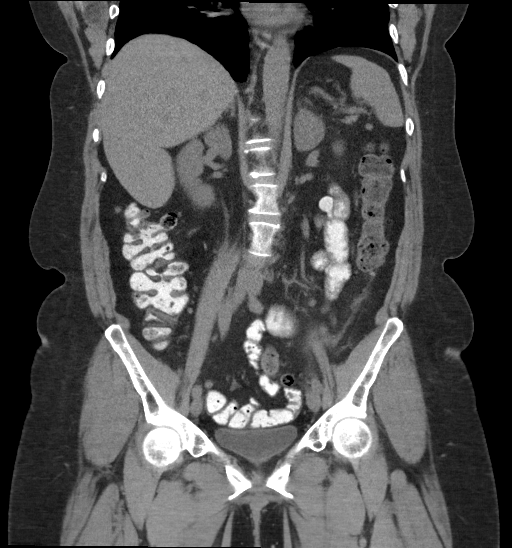

[16 of 46 positions shown; findings below may reference images not displayed]

FINDINGS: Lower chest: No consolidation or pleural effusion. Heart is normal
in size.

Hepatobiliary: No focal hepatic abnormality on noncontrast exam.
Clips in the gallbladder fossa postcholecystectomy. No biliary
dilatation.

Pancreas: No ductal dilatation or inflammation.

Spleen: Normal in size without focal abnormality. Small splenule
inferiorly.

Adrenals/Urinary Tract: Normal adrenal glands. Tiny nonobstructing
stone in the lower right kidney. No hydronephrosis or perinephric
edema. No ureteral calculi. The urinary bladder is minimally
distended. No bladder stone or wall thickening.

Stomach/Bowel: Inflamed diverticulum involving the distal descending
colon with pericolonic fat stranding and edema. There is no
perforation or abscess. Small amount of edema and free fluid tracks
into the left pericolic gutter. This is more proximal than the
previous site of diverticulitis. Multiple additional noninflamed
diverticula throughout the colon. Moderate volume of stool.
Administered enteric contrast reaches the proximal transverse. The
appendix is normal. There is no small bowel inflammation. Terminal
ileum is normal. Small hiatal hernia.

Vascular/Lymphatic: Normal caliber abdominal aorta. No enlarged
lymph nodes in the abdomen or pelvis.

Reproductive: Status post hysterectomy. No adnexal masses.

Other: Edema and fat stranding in the left lower quadrant and
pericolic gutter. No abscess or free air. No upper abdominal
ascites. Small fat containing left inguinal hernia.

Musculoskeletal: Degenerative change in the lower lumbar spine. Mild
lumbar scoliosis. There are no acute or suspicious osseous
abnormalities.
IMPRESSION: 1. Acute uncomplicated diverticulitis of the distal descending
colon. No perforation or abscess.
2. Tiny nonobstructing stone in the lower right kidney.
3. Small hiatal hernia.

## 2022-06-12 NOTE — Telephone Encounter (Signed)
Fax from [facility]. Patient recevied entyvio 326m and tolerated without complications. Next appt for infusion is 08/06/22 at 3pm.

## 2022-06-18 ENCOUNTER — Other Ambulatory Visit (HOSPITAL_COMMUNITY): Payer: Self-pay

## 2022-07-05 ENCOUNTER — Other Ambulatory Visit (HOSPITAL_COMMUNITY): Payer: Self-pay

## 2022-07-06 ENCOUNTER — Other Ambulatory Visit (HOSPITAL_COMMUNITY): Payer: Self-pay | Admitting: Family Medicine

## 2022-07-06 ENCOUNTER — Other Ambulatory Visit (HOSPITAL_COMMUNITY): Payer: Self-pay

## 2022-07-06 DIAGNOSIS — Z1231 Encounter for screening mammogram for malignant neoplasm of breast: Secondary | ICD-10-CM

## 2022-07-06 MED ORDER — CYCLOBENZAPRINE HCL 5 MG PO TABS
ORAL_TABLET | ORAL | 0 refills | Status: DC
  Filled 2022-07-06: qty 40, 14d supply, fill #0

## 2022-07-22 ENCOUNTER — Other Ambulatory Visit (INDEPENDENT_AMBULATORY_CARE_PROVIDER_SITE_OTHER): Payer: Self-pay | Admitting: Internal Medicine

## 2022-07-22 ENCOUNTER — Other Ambulatory Visit (HOSPITAL_COMMUNITY): Payer: Self-pay

## 2022-07-22 MED ORDER — HYOSCYAMINE SULFATE 0.125 MG SL SUBL
0.1250 mg | SUBLINGUAL_TABLET | Freq: Three times a day (TID) | SUBLINGUAL | 1 refills | Status: AC
  Filled 2022-07-22: qty 90, 30d supply, fill #0
  Filled 2022-10-19: qty 90, 30d supply, fill #1

## 2022-07-23 ENCOUNTER — Other Ambulatory Visit (HOSPITAL_COMMUNITY): Payer: Self-pay

## 2022-07-23 MED ORDER — LOSARTAN POTASSIUM 100 MG PO TABS
100.0000 mg | ORAL_TABLET | Freq: Every day | ORAL | 1 refills | Status: DC
  Filled 2022-07-23: qty 90, 90d supply, fill #0
  Filled 2022-10-19: qty 90, 90d supply, fill #1

## 2022-07-24 ENCOUNTER — Other Ambulatory Visit (HOSPITAL_COMMUNITY): Payer: Self-pay

## 2022-07-25 ENCOUNTER — Other Ambulatory Visit (HOSPITAL_COMMUNITY): Payer: Self-pay

## 2022-07-27 ENCOUNTER — Ambulatory Visit (INDEPENDENT_AMBULATORY_CARE_PROVIDER_SITE_OTHER): Payer: 59 | Admitting: Gastroenterology

## 2022-07-27 ENCOUNTER — Other Ambulatory Visit (HOSPITAL_COMMUNITY): Payer: Self-pay

## 2022-07-27 MED ORDER — MONTELUKAST SODIUM 10 MG PO TABS
10.0000 mg | ORAL_TABLET | Freq: Every day | ORAL | 1 refills | Status: DC
  Filled 2022-07-27: qty 90, 90d supply, fill #0

## 2022-08-05 ENCOUNTER — Ambulatory Visit (HOSPITAL_COMMUNITY)
Admission: RE | Admit: 2022-08-05 | Discharge: 2022-08-05 | Disposition: A | Discharge status: Home / Self Care | Payer: 59 | Source: Ambulatory Visit | Attending: Family Medicine | Admitting: Family Medicine

## 2022-08-05 DIAGNOSIS — Z1231 Encounter for screening mammogram for malignant neoplasm of breast: Secondary | ICD-10-CM | POA: Diagnosis not present

## 2022-08-06 ENCOUNTER — Telehealth (INDEPENDENT_AMBULATORY_CARE_PROVIDER_SITE_OTHER): Payer: Self-pay | Admitting: *Deleted

## 2022-08-06 DIAGNOSIS — K5000 Crohn's disease of small intestine without complications: Secondary | ICD-10-CM | POA: Diagnosis not present

## 2022-08-06 NOTE — Telephone Encounter (Signed)
Fax from [facility]. Entyvio 343m was administered on 08/06/22 and pt tolerated infusion without complications. Next appt is 12/28

## 2022-08-07 DIAGNOSIS — D511 Vitamin B12 deficiency anemia due to selective vitamin B12 malabsorption with proteinuria: Secondary | ICD-10-CM | POA: Diagnosis not present

## 2022-08-24 ENCOUNTER — Other Ambulatory Visit (HOSPITAL_COMMUNITY): Payer: Self-pay

## 2022-08-28 ENCOUNTER — Other Ambulatory Visit (HOSPITAL_COMMUNITY): Payer: Self-pay

## 2022-08-29 ENCOUNTER — Other Ambulatory Visit (HOSPITAL_COMMUNITY): Payer: Self-pay

## 2022-09-10 ENCOUNTER — Telehealth (INDEPENDENT_AMBULATORY_CARE_PROVIDER_SITE_OTHER): Payer: Self-pay | Admitting: *Deleted

## 2022-09-10 NOTE — Patient Instructions (Signed)
Below is our plan:  We will switch Nurtec back to Ajovy. Continue acetazolimide 575m three times daily, amitriptyline 263mat bedtime and rizatriptan as needed. Consider Botox if headaches are not improving.   Please make sure you are staying well hydrated. I recommend 50-60 ounces daily. Well balanced diet and regular exercise encouraged. Consistent sleep schedule with 6-8 hours recommended.   Please continue follow up with care team as directed.   Follow up with me in 6 months   You may receive a survey regarding today's visit. I encourage you to leave honest feed back as I do use this information to improve patient care. Thank you for seeing me today!

## 2022-09-10 NOTE — Telephone Encounter (Signed)
Left message to return call 

## 2022-09-10 NOTE — Telephone Encounter (Signed)
Yes, let's keep her every 8 weeks. Please also order a Quantiferon a Hepatitis B surface antigen and mail it to the patient (please let her know this is a yearly requirement) Thanks

## 2022-09-10 NOTE — Telephone Encounter (Signed)
Order from [facility] on your desk to review for refills on entyvio 300 mg. Frequency was not marked on formed but she has been getting every 8 weeks if you want to keep her on this schedule. Last seen 04/27/22.

## 2022-09-10 NOTE — Progress Notes (Signed)
Chief Complaint  Patient presents with   Follow-up    Pt in room #1 and alone. Pt here today to f/u IIH and her migraines.   HISTORY OF PRESENT ILLNESS:  09/15/2022 ALL: [name] returns for follow up for migraines and IIH. She was last seen 03/2022. We switched from Emgality back to [name]. Ajovy was not approved and we switched to Nurtec QOD. She continued amitriptyline 86m QHS, acetazolamide 500 TID and rizatriptan PRN. She reports no significant change in headaches. She reports near daily headaches. She has to take rizatriptan 1-2 times a week. Eye exam was unremarkable about 2 months ago.   03/11/2022 ALL: [name] for follow up for migraines and IIH. She was last seen 03/2021 and doing well on Emgality, amitriptyline 241mQHS, acetazolamide 500 BID and rizatriptan PRN. She called 06/2021 with worsening tension headaches and advised to increase acetazolamide to 500/1000. Since, she feels that she was doing a little better for a couple of months but recently having more headaches. She continues to have near daily headaches. She has about 5-10 migraines per month. Right eye is sensitive to light. She continues to feel pressure in right eye and has some pressure. Last eye exam was about this time last year.  Rizatriptan does work for abortive therapy. She also uses complementary therapy. BP has been better. She was recently started Entyvio injections for Chron's. She has gained some weight.   03/11/2021 ALL:  [name] for follow up for migraines and IIH. She was taking Ajovy, amitriptyline, acetazolamide and rizatriptan. Ajovy was no longer covered on insurance and she was switched to [name] CorporationShe has had 1 injection. She is tolerating acetazolamide. Amitriptyline helps with sleep. She feels that headaches are well managed. She does have some sort of headache almost every day. She takes rizatriptan maybe once a month. Some months she doesn't have to take it. She had right TKR 01/23/2021. She  took oxycodone post surgery but has not taken regularly. She has tried to wean gabapentin. She is having more low back pain and "nerve pain" of right leg. She called ortho for a refill of gabapentin.   10/14/2020 [name]: Interval history October 13, 2020(last seen by myself 9/17 for appointment and 10/72021 for emg/ncs and also seen by NP [name] 07/29/2020: Patient is here for a follow-up of migraines and other multiple neurologic symptoms.  At last appointment she was started on Ajovy and amitriptyline for her migraines, Diamox after LP with elevated opening pressure, rizatriptan for acute migraine management, and an EMG nerve conduction study was performed which did not show anything significant to explain all her other multiple neurologic symptoms(See below) in the setting of mood disorder which may be contributory.  Per [name] the rizatriptan was helping, amitriptyline was helping her sleep.  She also endorsed some decreased intensity of headaches and migraines to starting Ajovy, amitriptyline and Diamox.  [name] continued current treatment.  Since last being seen she was seen by ophthalmology.  Neuro exam is nonfocal.   Her headaches and migraines are feeling better. She still has the pressure in the head but improved. Increase Diamox, keep her on the Ajovy.   07/29/2020 ALL:  [name] a [age].o. female here today for follow up for headaches. She was started on Ajovy and amitriptyline for migraine prevention as well as rizatriptan for abortive therapy in 06/2020. MRI showed concerns for partially empty sella and she was started on Diamox 25041mID. She had  a LP on 07/17/2020 and started on Diamox 07/18/2020. She called 07/22/2020 reporting symptoms of increased pressure and pulsating of head with bilateral lower back pain (not at site of LP). Symptoms lasted for about 2-3 hours. She felt that she was having a hard time walking. She states that head pressure comes and goes. She has taken  rizatriptan three times and it does abort migraine. She feels that she has brain fog at times. She has noted some tingling of feet. She is sleeping better after starting amitriptyline.   Low back continues with any prolonged standing. She has numbness and tingling of her lower extremities, worse on left. She reports her feet feel like they are going to sleep. NCS/EMG showed possible remote lumbosacral radiculopathy. She is was previously seeing [name] for similar symptoms. Referral was placed back to [name].    HISTORY (copied from [name] note on 06/21/2020)  HPI:  [name] is a 56 y.o. female here as requested by [name] Sites, MD for headaches.  Past medical history obesity, headache, vitamin D deficiency.  I reviewed [name]. Delanna Ahmadi notes which provided no significant history of patient's chief complaint.  She is here today alone, she has been having headaches, started sometime in May, she had a physical at the end of the May and discussed headaches have been going away, also tired no energy, found out she was B12 deficiency and started shots per month.  Headaches are "minute", can also light sensitivity and nausea, she has some GI issues, Crohn's, has been on steroids.  She also reports nerve pain and stiffness in her hands and feet, she stopped using CPAP due to losing weight and long longer having any problems. She has been having headaches she has pressure in the back of her head at the occipital area and down to her neck, she has tried massage. They start in over the eyes as well. When they are really intense, she is sensitive to light, she has pressure in the back, nausea, pulsating/pulsing/throbbing. She is having the migraines at least 4x a week, they never really go away they just minimize, she will wake up with headaches, she had lost about 40 pounds and she has OSA in the past. She has gained 25 pounds since March, she doesn't sleep, ongoing for about a year after she found  her brother dead in 2023-09-16. She is fatigued, she has sleep issues. She staets she doesn't sleep all night and go without any sleep for 3 day - she says literally no sleep she turns a lot.  She has vision changes, headache are positional worse supinein the morning, morning headaches. She has muscle cramps, lots of stress. No other focal neurologic deficits, associated symptoms, inciting events or modifiable factors.   Reviewed notes, labs and imaging from outside physicians, which showed:   MRI brain 2017: personally reviewed images and agree with the following:  Brain: No diffusion signal abnormality. Stable partially empty sella turcica. No significant T2 FLAIR signal abnormality. No abnormal enhancement. No abnormal susceptibility hypointensity to indicate intracranial hemorrhage. No focal mass effect. No extra-axial collection. Normal ventricle size.   MRA H/N 09/2016 reviewed reports/findings:   1. No acute intracranial abnormality. Normal MRI of the brain for age. 2. Patent circle of Willis. No large vessel occlusion, aneurysm, or significant stenosis is identified. 3. Patent carotid and vertebral arteries of the neck. No dissection, aneurysm, or significant stenosis is identified.   MRI cervical spine 01/08/2012:  personally reviewed images and  agree with the following: 1. Tiny left paracentral disc protrusion at C5-6 without significant  canal or neural foraminal stenosis. Finding is stable relative to  previous MRI from 05/28/2007.  2. Mild degenerative disc disease at C4-5 without canal or foraminal  stenosis.  3. Otherwise unremarkable MRI of the cervical spine.    CBC/CMP: normal 04/12/2020   Amlodipine, tylenol, aspirin, flexeril, gabapentin, ibuprofen, toradol injection, magnesium, losartan, meclizine, mobic, reglan, zofran, tramadol, topiramate   REVIEW OF SYSTEMS: Out of a complete 14 system review of symptoms, the patient complains only of the following symptoms,  headaches, numbness and tingling of extremities, brain fog, mass of right posterior thigh and all other reviewed systems are negative.   ALLERGIES: Allergies  Allergen Reactions   Iodinated Contrast Media Anaphylaxis and Hives   Other Shortness Of Breath    Lemon grass   Wasp Venom Anaphylaxis and Shortness Of Breath   Pollen Extract Swelling   Adhesive [Tape] Rash   Hydromorphone Hcl Nausea And Vomiting    headaches   Omnipaque [Iohexol] Hives and Nausea Only    Pt. Was premedicated with emergent protocol.     HOME MEDICATIONS: Outpatient Medications Prior to Visit  Medication Sig Dispense Refill   acetaminophen (TYLENOL) 500 MG tablet Take 2 tablets (1,000 mg total) by mouth every 8 (eight) hours. (Patient taking differently: Take 1,000 mg by mouth every 8 (eight) hours as needed for moderate pain.) 30 tablet 0   acetaZOLAMIDE ER (DIAMOX) 500 MG capsule Take 1 capsule by mouth 3 times daily. (Patient taking differently: Take 500-1,000 mg by mouth See admin instructions. Take 500 mg in the morning and 1000 mg at night) 270 capsule 3   albuterol (PROVENTIL) (2.5 MG/3ML) 0.083% nebulizer solution Inhale the contents of 1 vial via nebulizer every 6 hours as needed 90 mL 1   albuterol (VENTOLIN HFA) 108 (90 Base) MCG/ACT inhaler Inhale 1-2 puffs by mouth every 4 hours as needed 6.7 g 2   amitriptyline (ELAVIL) 25 MG tablet TAKE 1 TABLET BY MOUTH AT BEDTIME 90 tablet 3   amLODipine (NORVASC) 5 MG tablet TAKE 1 TABLET BY MOUTH ONCE DAILY 90 tablet 2   amoxicillin (AMOXIL) 500 MG capsule TAKE 4 CAPSULES BY MOUTH 1 HOUR PRIOR TO DENTAL APPOINTMENT. 12 capsule 0   Ascorbic Acid (VITAMIN C PO) Take 1 lozenge by mouth daily as needed (immune support).     Cyanocobalamin (VITAMIN B-12 IJ) Inject 1 Dose as directed every 30 (thirty) days.     cyclobenzaprine (FLEXERIL) 5 MG tablet Take 1 tablet by mouth every 8 hours as needed for muscle spasm 40 tablet 0   diclofenac Sodium (VOLTAREN) 1 % GEL  Apply 1 Application topically 4 (four) times daily as needed (pain).     diphenhydrAMINE (BENADRYL) 25 MG tablet Take 25-50 mg by mouth every 6 (six) hours as needed for allergies.     EPINEPHrine (EPIPEN 2-PAK) 0.3 mg/0.3 mL IJ SOAJ injection Inject as directed for anaphylaxis. 2 each 5   famotidine (PEPCID) 40 MG tablet Take 1 tablet by mouth at bedtime. Needs office visit. 90 tablet 3   gabapentin (NEURONTIN) 300 MG capsule Take 1 capsule (300 mg total) by mouth at bedtime. 90 capsule 5   guaiFENesin-codeine (ROBITUSSIN AC) 100-10 MG/5ML syrup Take 10 MLs by mouth  every 6 hours as needed 240 mL 0   hyoscyamine (LEVSIN SL) 0.125 MG SL tablet Dissolve 1 tablet under the tongue 3 times daily before meals. 90 tablet 1  loratadine (CLARITIN) 10 MG tablet Take 10 mg by mouth at bedtime.     losartan (COZAAR) 100 MG tablet Take 1 tablet (100 mg total) by mouth daily. 90 tablet 1   meclizine (ANTIVERT) 25 MG tablet Take 25 mg by mouth 3 (three) times daily as needed for dizziness.     montelukast (SINGULAIR) 10 MG tablet TAKE 1 TABLET BY MOUTH EVERY EVENING 90 tablet 2   montelukast (SINGULAIR) 10 MG tablet Take 1 tablet (10 mg total) by mouth daily. 90 tablet 1   omeprazole (PRILOSEC) 40 MG capsule Take 1 capsule (40 mg total) by mouth daily. 30 capsule 1   ondansetron (ZOFRAN-ODT) 4 MG disintegrating tablet Dissolve 1 tablet by mouth every 8 hours as needed for nausea or vomiting. 90 tablet 1   polyethylene glycol (MIRALAX / GLYCOLAX) 17 g packet Take 17 g by mouth daily.     Rimegepant Sulfate (NURTEC) 75 MG TBDP Dissolve 1 tablet by mouth every other day. 16 tablet 11   rizatriptan (MAXALT-MLT) 10 MG disintegrating tablet Dissolve 1 tablet (10 mg total) by mouth as needed for migraine. May repeat in 2 hours if needed 9 tablet 11   tamsulosin (FLOMAX) 0.4 MG CAPS capsule Take 1 capsule (0.4 mg total) by mouth daily. (Patient taking differently: Take 0.4 mg by mouth daily as needed (kidney stones).)  30 capsule 2   vedolizumab (ENTYVIO) 300 MG injection Inject 300 mg into the vein every 8 (eight) weeks.     EPINEPHrine (EPIPEN) 0.3 mg/0.3 mL SOAJ injection Inject 0.3 mLs (0.3 mg total) into the muscle once. (Patient not taking: Reported on 09/15/2022) 1 Device 0   montelukast (SINGULAIR) 10 MG tablet Take 1 tablet by mouth daily (Patient not taking: Reported on 09/15/2022) 90 tablet 1   No facility-administered medications prior to visit.     PAST MEDICAL HISTORY: Past Medical History:  Diagnosis Date   Anxiety    Arthritis    Asthma    Cervical hyperplasia    hisotry of   Chest pain    cath 2005 norm cors, repeat cath Feb 2011 normal cors and only minimally  elevated pulmonary pressures   Crohn disease ([place])    Diverticulitis of colon 07/17/2014   Diverticulosis    Dyspnea    GERD (gastroesophageal reflux disease)    Hiatal hernia    History of iron deficiency anemia    Hypertension    Idiopathic intracranial hypertension    Per patient   Migraines    Obesity    Palpitations    Pneumonia 2008   14 days hospitalization, bilateral   PONV (postoperative nausea and vomiting)    Sleep apnea sleep study 11/03/2009    cpap; does sometimes, doesnt use every night. weight loss no longer using CPAP   Vitamin B deficiency      PAST SURGICAL HISTORY: Past Surgical History:  Procedure Laterality Date   ABDOMINAL HYSTERECTOMY     ANKLE ARTHROSCOPY  06/01/2012   Procedure: ANKLE ARTHROSCOPY;  Surgeon: Colin Rhein, MD;  Location: [facility];  Service: Orthopedics;  Laterality: Left;  left ankle arthorsocpy with extensive debridement and gastroc slide   ANKLE SURGERY Left 05/11/2018   BIOPSY  04/26/2019   Procedure: BIOPSY;  Surgeon: Rogene Houston, MD;  [facility];  Service: Endoscopy;;  ileum colon   BIOPSY  03/25/2022   Procedure: BIOPSY;  Surgeon: Harvel Quale, MD;  [facility];  Service: Gastroenterology;;  CARDIAC  CATHETERIZATION  11/22/2009 and 2005   WNL   CESAREAN SECTION     CHOLECYSTECTOMY  09/08/2006   lap. chole.   CHONDROPLASTY  06/17/2012   Procedure: CHONDROPLASTY;  Surgeon: Carole Civil, MD;  Location: [facility];  Service: Orthopedics;  Laterality: Right;  right patella   COLONOSCOPY N/A 04/26/2019   Rehman: a few erosions in ternimal ileum, two erosions in cecum (assocaited with acute inflammation) diverticulosis in sigmoid hepatic flexure and ascending colon, external hemorrhoids   COLONOSCOPY WITH PROPOFOL N/A 03/25/2022   Procedure: COLONOSCOPY WITH PROPOFOL;  Surgeon: Harvel Quale, MD;  [facility];  Service: Gastroenterology;  Laterality: N/A;  830 ASA 1   ESOPHAGOGASTRODUODENOSCOPY N/A 05/14/2015   Procedure: ESOPHAGOGASTRODUODENOSCOPY (EGD);  Surgeon: Rogene Houston, MD;  [facility];  Service: Endoscopy;  Laterality: N/A;  730   ESOPHAGOGASTRODUODENOSCOPY (EGD) WITH PROPOFOL N/A 03/25/2022   Procedure: ESOPHAGOGASTRODUODENOSCOPY (EGD) WITH PROPOFOL;  Surgeon: Harvel Quale, MD;  [facility];  Service: Gastroenterology;  Laterality: N/A;   GIVENS CAPSULE STUDY N/A 05/24/2019   Procedure: GIVENS CAPSULE STUDY;  Surgeon: Rogene Houston, MD;  [facility];  Service: Endoscopy;  Laterality: N/A;  7:30   INGUINAL HERNIA REPAIR  10/30/2008   right   KNEE ARTHROSCOPY Right 2013   KNEE ARTHROSCOPY WITH LATERAL MENISECTOMY Left 12/23/2016   Procedure: LEFT KNEE ARTHROSCOPY WITH LATERAL MENISECTOMY;  Surgeon: Carole Civil, MD;  Location: [facility];  Service: Orthopedics;  Laterality: Left;   KNEE ARTHROSCOPY WITH MEDIAL MENISECTOMY Left 12/23/2016   Procedure: LEFT KNEE ARTHROSCOPY WITH MEDIAL MENISECTOMY CHONDROPLASTY PATELLA  AND MEDIAL FEMORAL CONDYLE LEFT KNEE;  Surgeon: Carole Civil, MD;  Location: [facility];  Service: Orthopedics;  Laterality: Left;   POLYPECTOMY  03/25/2022   Procedure: POLYPECTOMY;   Surgeon: Harvel Quale, MD;  [facility];  Service: Gastroenterology;;   SHOULDER ARTHROSCOPY WITH SUBACROMIAL DECOMPRESSION AND BICEP TENDON REPAIR Left 12/21/2017   Procedure: Left shoulder arthroscopic biceps tenodesis, SAD, DCR and labrum debridement;  Surgeon: Nicholes Stairs, MD;  Location: [facility];  Service: Orthopedics;  Laterality: Left;  120 mins   SHOULDER SURGERY     right x 2    TOTAL KNEE ARTHROPLASTY Left 06/06/2019   Procedure: TOTAL KNEE ARTHROPLASTY;  Surgeon: Paralee Cancel, MD;  Location: [facility];  Service: Orthopedics;  Laterality: Left;  70 mins   TOTAL KNEE ARTHROPLASTY Right 01/23/2021   Procedure: TOTAL KNEE ARTHROPLASTY;  Surgeon: Paralee Cancel, MD;  Location: [facility];  Service: Orthopedics;  Laterality: Right;  70 mins     FAMILY HISTORY: Family History  Problem Relation Age of Onset   Hypertension Mother    Atrial fibrillation Mother    Arthritis Mother    Lupus Father    COPD Father    Rheum arthritis Father    Sarcoidosis Father    Lupus Sister    Congestive Heart Failure Maternal Grandmother    Migraines Neg Hx    Headache Neg Hx      SOCIAL HISTORY: Social History   Socioeconomic History   Marital status: Married    Spouse name: Not on file   Number of children: 4   Years of education: Not on file   Highest education level: Not on file  Occupational History    Employer: [employer]    Comment: corporate safety  Tobacco Use   Smoking status: Never    Passive exposure: Never  Smokeless tobacco: Never  Vaping Use   Vaping Use: Never used  Substance and Sexual Activity   Alcohol use: No    Alcohol/week: 0.0 standard drinks of alcohol   Drug use: Never   Sexual activity: Yes    Birth control/protection: Surgical  Other Topics Concern   Not on file  Social History Narrative   Lives with husband and oldest son    Caffeine use: about 3 cups of coffee/day   She works in Chief Operating Officer with [employer]    She has 4 children age 57-22.     Social Determinants of Health   Financial Resource Strain: Not on file  Food Insecurity: Not on file  Transportation Needs: Not on file  Physical Activity: Not on file  Stress: Not on file  Social Connections: Not on file  Intimate Partner Violence: Not on file      PHYSICAL EXAM  Vitals:   09/15/22 0753  BP: 123/78  Pulse: 77  Weight: 265 lb 8 oz (120.4 kg)  Height: 5' 11"  (1.803 m)     Body mass index is 37.03 kg/m.   Generalized: Well developed, in no acute distress   Neurological examination  Mentation: Alert oriented to time, place, history taking. Follows all commands speech and language fluent Cranial nerve II-XII: Pupils were equal round reactive to light. Extraocular movements were full, visual field were full on confrontational test. Facial sensation and strength were normal. Uvula tongue midline. Head turning and shoulder shrug  were normal and symmetric. Motor: The motor testing reveals 5 over 5 strength of all 4 extremities. Good symmetric motor tone is noted throughout.  Sensory: Sensory testing is intact to soft touch on all 4 extremities. No evidence of extinction is noted.  Gait and station: Gait is arthritic, stable without assistive device   DIAGNOSTIC DATA (LABS, IMAGING, TESTING) - I reviewed patient records, labs, notes, testing and imaging myself where available.  Lab Results  Component Value Date   WBC 5.1 04/01/2022   HGB 12.9 04/01/2022   HCT 39.5 04/01/2022   MCV 87 04/01/2022   PLT 362 04/01/2022      Component Value Date/Time   NA 144 04/01/2022 1626   K 4.0 04/01/2022 1626   CL 106 04/01/2022 1626   CO2 23 04/01/2022 1626   GLUCOSE 97 04/01/2022 1626   GLUCOSE 106 (H) 09/15/2021 1949   BUN 14 04/01/2022 1626   CREATININE 1.08 (H) 04/01/2022 1626   CREATININE 1.03 07/31/2021 1140   CALCIUM 9.6 04/01/2022 1626   PROT 7.2 04/01/2022 1626   ALBUMIN 4.6 04/01/2022 1626   AST 20 04/01/2022 1626    ALT 21 04/01/2022 1626   ALKPHOS 66 04/01/2022 1626   BILITOT 0.4 04/01/2022 1626   GFRNONAA 56 (L) 09/15/2021 1949   GFRNONAA 87 08/01/2019 1530   GFRAA >60 04/12/2020 1232   GFRAA 100 08/01/2019 1530   Lab Results  Component Value Date   CHOL 165 04/26/2016   HDL 41 04/26/2016   LDLCALC 88 04/26/2016   TRIG 181 (H) 04/26/2016   CHOLHDL 4.0 04/26/2016   Lab Results  Component Value Date   HGBA1C 5.8 (H) 04/26/2016   Lab Results  Component Value Date   VITAMINB12 345 07/31/2021   Lab Results  Component Value Date   TSH 0.880 07/11/2020      ASSESSMENT AND PLAN  56 y.o. year old female  has a past medical history of Anxiety, Arthritis, Asthma, Cervical hyperplasia, Chest pain, Crohn disease ([place]),  Diverticulitis of colon (07/17/2014), Diverticulosis, Dyspnea, GERD (gastroesophageal reflux disease), Hiatal hernia, History of iron deficiency anemia, Hypertension, Idiopathic intracranial hypertension, Migraines, Obesity, Palpitations, Pneumonia (2008), PONV (postoperative nausea and vomiting), Sleep apnea (sleep study 11/03/2009), and Vitamin B deficiency. here with   Chronic migraine without aura without status migrainosus, not intractable  IIH (idiopathic intracranial hypertension)  Jeraldine repots that headaches are unchanged over the past few months. Although she continues to have a mild tension headache most days, she does not feel these are bothersome. She does feel that migraines were better managed on Ajovy. I will discontinue Nurtec and restart Ajovy. Failed Emgality. Amovig and Qulipta contraindicated due to chronic constipation. We will continue acetazolamide 500 TID, amitriptyline 45m QHS and rizatriptan PRN. Consider Botox in future. Healthy lifestyle habits encouraged. Follow-up recommended in 6 months, sooner if needed. She verbalizes understanding and agreement with this plan.   ADebbora Presto MSN, FNP-C 09/15/2022, 7:55 AM  [facility] [address] [place] [place] [phone]

## 2022-09-11 ENCOUNTER — Other Ambulatory Visit (INDEPENDENT_AMBULATORY_CARE_PROVIDER_SITE_OTHER): Payer: Self-pay | Admitting: *Deleted

## 2022-09-11 DIAGNOSIS — K50019 Crohn's disease of small intestine with unspecified complications: Secondary | ICD-10-CM

## 2022-09-11 NOTE — Telephone Encounter (Signed)
Discussed with patient and orders put in for [facility] and mailed to patient

## 2022-09-15 ENCOUNTER — Other Ambulatory Visit (HOSPITAL_COMMUNITY): Payer: Self-pay

## 2022-09-15 ENCOUNTER — Encounter: Payer: Self-pay | Admitting: Family Medicine

## 2022-09-15 ENCOUNTER — Ambulatory Visit: Payer: 59 | Admitting: Family Medicine

## 2022-09-15 VITALS — BP 123/78 | HR 77 | Ht 71.0 in | Wt 265.5 lb

## 2022-09-15 DIAGNOSIS — G932 Benign intracranial hypertension: Secondary | ICD-10-CM

## 2022-09-15 DIAGNOSIS — G43709 Chronic migraine without aura, not intractable, without status migrainosus: Secondary | ICD-10-CM | POA: Diagnosis not present

## 2022-09-15 MED ORDER — AJOVY 225 MG/1.5ML ~~LOC~~ SOAJ
225.0000 mg | SUBCUTANEOUS | 3 refills | Status: DC
  Filled 2022-09-15 – 2022-10-19 (×2): qty 4.5, 90d supply, fill #0
  Filled 2023-01-22 – 2023-01-26 (×2): qty 4.5, 90d supply, fill #1
  Filled 2023-01-28: qty 1.5, 30d supply, fill #1
  Filled 2023-03-03 – 2023-03-23 (×2): qty 1.5, 30d supply, fill #2

## 2022-09-17 ENCOUNTER — Telehealth: Payer: Self-pay | Admitting: *Deleted

## 2022-09-17 NOTE — Telephone Encounter (Signed)
Submitted PA Ajovy on covermymeds. Key: [id number]. PA approved: "The request has been approved. The authorization is effective from 09/17/2022 to 03/18/2023, as long as the member is enrolled in their current health plan. The request was approved with a quantity restriction. This has been approved for a max daily dosage of 0.05. A written notification letter will follow with additional details."

## 2022-09-18 DIAGNOSIS — D511 Vitamin B12 deficiency anemia due to selective vitamin B12 malabsorption with proteinuria: Secondary | ICD-10-CM | POA: Diagnosis not present

## 2022-09-23 DIAGNOSIS — K50019 Crohn's disease of small intestine with unspecified complications: Secondary | ICD-10-CM | POA: Diagnosis not present

## 2022-09-24 ENCOUNTER — Other Ambulatory Visit (HOSPITAL_COMMUNITY): Payer: Self-pay

## 2022-09-24 ENCOUNTER — Telehealth: Payer: Self-pay | Admitting: *Deleted

## 2022-09-24 ENCOUNTER — Ambulatory Visit (INDEPENDENT_AMBULATORY_CARE_PROVIDER_SITE_OTHER): Payer: 59 | Admitting: Gastroenterology

## 2022-09-24 ENCOUNTER — Encounter (INDEPENDENT_AMBULATORY_CARE_PROVIDER_SITE_OTHER): Payer: Self-pay | Admitting: Gastroenterology

## 2022-09-24 VITALS — BP 122/82 | HR 85 | Temp 96.6°F | Ht 71.5 in | Wt 265.2 lb

## 2022-09-24 DIAGNOSIS — R6881 Early satiety: Secondary | ICD-10-CM | POA: Diagnosis not present

## 2022-09-24 DIAGNOSIS — E559 Vitamin D deficiency, unspecified: Secondary | ICD-10-CM | POA: Diagnosis not present

## 2022-09-24 DIAGNOSIS — R110 Nausea: Secondary | ICD-10-CM | POA: Diagnosis not present

## 2022-09-24 DIAGNOSIS — K50019 Crohn's disease of small intestine with unspecified complications: Secondary | ICD-10-CM

## 2022-09-24 MED ORDER — OMEPRAZOLE 40 MG PO CPDR
40.0000 mg | DELAYED_RELEASE_CAPSULE | Freq: Every day | ORAL | 3 refills | Status: DC
  Filled 2022-09-24 – 2022-10-19 (×2): qty 90, 90d supply, fill #0
  Filled 2023-04-19: qty 90, 90d supply, fill #1
  Filled 2023-08-23: qty 90, 90d supply, fill #2

## 2022-09-24 NOTE — Progress Notes (Addendum)
Referring Provider: Sharilyn Sites, MD Primary Care Physician:  Sharilyn Sites, MD Primary GI Physician: Jenetta Downer   Chief Complaint  Patient presents with   Follow-up    Patient here today for a follow upon crohns. She is still having small stools. She is having bloating and feels like food is not digesting fast enough. Patient has not seen any dark or bloody stools. Says she has some issues with occasional constipation. Patient on Entyvio Q 8 weeks.   HPI:   [name] is a 56 y.o. female with past medical history of asthma, small bowel Crohn's disease, GERD, hypertension, obesity, diverticulosis and multiple episodes diverticulitis   Patient presenting today for follow up of Crohn's disease and GERD  Last seen July 2023, at that time  feels that her "gut is in a constant tug of war." She has intermittent abdominal pain, more on the RLQ, sometimes right mid abdomen. Previously she felt that she could really feel her food passing through her system and had a lot of discomfort with walking but this has improved quite a bit. She is having on average 3-4 BMs per day, most days, previously with 6-8 BMs per day every day, though sometimes she can still have this many but less often. She sometimes may have 2-3 days where she may not have any BMs at all, stools are still very pencil thin. On the days she is having more formed stools, she almost feels more constipated and like she has to strain some, though stools do not seem to be that hard. She tends to have nausea when she feels more constipated. She is taking Levsin as needed, she does not take it everyday but when she does take it, she will usually repeat it up to 3-4x in a day, maybe 3 out of 7 days. She is using zofran during these times as well. She does note that she had 2 episodes of acid regurgitation last week that awoke her from sleep   Recommended continue Entyvio q8w, repeat labs in 6 months, continue zofran and levsin PRN, MR  Enterography w wo   Fecal calprotectin in feb 2023 was 50   Labs on 04/01/22 with CBC WNL, CMP with creatnine slightly bumped to 1.08. CRP 3, vedolizumab levels (17)therapeutic.   Hep B done 12/20 negative, TB quant results pending  Present:  She reports that she is still having thinner stools.she feels like she still has to strain to defecate. She continues to feel that food passes slowly through her system. She has some nausea at times, maybe 2-3x per week. She sometimes tries to eat later in the day to avoid feeling nauseated or full. She states that her husband said she has a lot of flatulence at night but she does not notice this during the day. She has some abdominal discomfort and bloating at times, occasional stomach cramping and will take levsin for with some relief. She was previously taking miralax to help with needing to strain, she did not feel that it helped very much.   She feels that she may have had a diverticulitis flare about 2 weeks ago with some LLQ pain. She was having diarrhea as well. No rectal bleeding, No fevers or chills. LLQ pain has almost resolved now. Diarrhea has resolved and stools are back to baseline. She used levsin during this time with some improvement.   She notes she is at her heaviest weight wise and is trying to work on this.  GERD is well  controlled on omeprazole 7m daily and famotidine 470mnightly. Has had about 2 episodes of acid regurgitation since her last visit. No dysphagia or odynophagia.   Extraintestinal Manifestations: Skin: no rashes or lesions  Joints: no joint pain or swelling  Eyes: no vision changes  Last Colonoscopy:03/25/22- One 4 mm polyp in the cecum, removed with a cold snare.tubular adenoma  - Melanosis in the colon. - Diverticulosis in the entire examined colon. - The distal rectum and anal verge are normal on retroflexion view. Last Endoscopy:03/25/22- 3 cm hiatal hernia. - Non-obstructing Schatzki ring. - Normal  stomach. - Normal examined Normal examined duodenum, biopsied-mild inflammation   Recommendations:  Repeat colon in 5 years   Past Medical History:  Diagnosis Date   Anxiety    Arthritis    Asthma    Cervical hyperplasia    hisotry of   Chest pain    cath 2005 norm cors, repeat cath Feb 2011 normal cors and only minimally  elevated pulmonary pressures   Crohn disease (HCLomax   Diverticulitis of colon 07/17/2014   Diverticulosis    Dyspnea    GERD (gastroesophageal reflux disease)    Hiatal hernia    History of iron deficiency anemia    Hypertension    Idiopathic intracranial hypertension    Per patient   Migraines    Obesity    Palpitations    Pneumonia 2008   14 days hospitalization, bilateral   PONV (postoperative nausea and vomiting)    Sleep apnea sleep study 11/03/2009    cpap; does sometimes, doesnt use every night. weight loss no longer using CPAP   Vitamin B deficiency     Past Surgical History:  Procedure Laterality Date   ABDOMINAL HYSTERECTOMY     ANKLE ARTHROSCOPY  06/01/2012   Procedure: ANKLE ARTHROSCOPY;  Surgeon: PaColin RheinMD;  [facility];  Laterality: Left;  left ankle arthorsocpy with extensive debridement and gastroc slide   ANKLE SURGERY Left 05/11/2018   BIOPSY  04/26/2019   Procedure: BIOPSY;  Surgeon: ReRogene HoustonMD;  [facility];  Service: Endoscopy;;  ileum colon   BIOPSY  03/25/2022   Procedure: BIOPSY;  Surgeon: CaHarvel QualeMD;  [facility];  Service: Gastroenterology;;   CARDIAC CATHETERIZATION  11/22/2009 and 2005   WNL   CESAREAN SECTION     CHOLECYSTECTOMY  09/08/2006   lap. chole.   CHONDROPLASTY  06/17/2012   Procedure: CHONDROPLASTY;  Surgeon: StCarole CivilMD;  Location: [facility];  Service: Orthopedics;  Laterality: Right;  right patella   COLONOSCOPY N/A 04/26/2019   Rehman: a few erosions in ternimal ileum, two erosions in  cecum (assocaited with acute inflammation) diverticulosis in sigmoid hepatic flexure and ascending colon, external hemorrhoids   COLONOSCOPY WITH PROPOFOL N/A 03/25/2022   Procedure: COLONOSCOPY WITH PROPOFOL;  Surgeon: CaHarvel QualeMD;  [facility];  Service: Gastroenterology;  Laterality: N/A;  830 ASA 1   ESOPHAGOGASTRODUODENOSCOPY N/A 05/14/2015   Procedure: ESOPHAGOGASTRODUODENOSCOPY (EGD);  Surgeon: NaRogene HoustonMD;  [facility];  Service: Endoscopy;  Laterality: N/A;  730   ESOPHAGOGASTRODUODENOSCOPY (EGD) WITH PROPOFOL N/A 03/25/2022   Procedure: ESOPHAGOGASTRODUODENOSCOPY (EGD) WITH PROPOFOL;  Surgeon: CaHarvel QualeMD;  [facility];  Service: Gastroenterology;  Laterality: N/A;   GIVENS CAPSULE STUDY N/A 05/24/2019   Procedure: GIVENS CAPSULE STUDY;  Surgeon: ReRogene HoustonMD;  [facility];  Service: Endoscopy;  Laterality: N/A;  7:30   INGUINAL HERNIA REPAIR  10/30/2008   right   KNEE ARTHROSCOPY Right 2013   KNEE ARTHROSCOPY WITH LATERAL MENISECTOMY Left 12/23/2016   Procedure: LEFT KNEE ARTHROSCOPY WITH LATERAL MENISECTOMY;  Surgeon: Carole Civil, MD;  Location: [facility];  Service: Orthopedics;  Laterality: Left;   KNEE ARTHROSCOPY WITH MEDIAL MENISECTOMY Left 12/23/2016   Procedure: LEFT KNEE ARTHROSCOPY WITH MEDIAL MENISECTOMY CHONDROPLASTY PATELLA  AND MEDIAL FEMORAL CONDYLE LEFT KNEE;  Surgeon: Carole Civil, MD;  [facility];  Service: Orthopedics;  Laterality: Left;   POLYPECTOMY  03/25/2022   Procedure: POLYPECTOMY;  Surgeon: Harvel Quale, MD;  [facility];  Service: Gastroenterology;;   SHOULDER ARTHROSCOPY WITH SUBACROMIAL DECOMPRESSION AND BICEP TENDON REPAIR Left 12/21/2017   Procedure: Left shoulder arthroscopic biceps tenodesis, SAD, DCR and labrum debridement;  Surgeon: Nicholes Stairs, MD;  Location: [facility];  Service: Orthopedics;  Laterality:  Left;  120 mins   SHOULDER SURGERY     right x 2    TOTAL KNEE ARTHROPLASTY Left 06/06/2019   Procedure: TOTAL KNEE ARTHROPLASTY;  Surgeon: Paralee Cancel, MD;  Location: [facility];  Service: Orthopedics;  Laterality: Left;  70 mins   TOTAL KNEE ARTHROPLASTY Right 01/23/2021   Procedure: TOTAL KNEE ARTHROPLASTY;  Surgeon: Paralee Cancel, MD;  Location: [facility];  Service: Orthopedics;  Laterality: Right;  70 mins    Current Outpatient Medications  Medication Sig Dispense Refill   acetaminophen (TYLENOL) 500 MG tablet Take 2 tablets (1,000 mg total) by mouth every 8 (eight) hours. (Patient taking differently: Take 1,000 mg by mouth every 8 (eight) hours as needed for moderate pain.) 30 tablet 0   acetaZOLAMIDE ER (DIAMOX) 500 MG capsule Take 1 capsule by mouth 3 times daily. (Patient taking differently: Take 500-1,000 mg by mouth See admin instructions. Take 500 mg in the morning and 1000 mg at night) 270 capsule 3   albuterol (PROVENTIL) (2.5 MG/3ML) 0.083% nebulizer solution Inhale the contents of 1 vial via nebulizer every 6 hours as needed 90 mL 1   albuterol (VENTOLIN HFA) 108 (90 Base) MCG/ACT inhaler Inhale 1-2 puffs by mouth every 4 hours as needed 6.7 g 2   amitriptyline (ELAVIL) 25 MG tablet TAKE 1 TABLET BY MOUTH AT BEDTIME 90 tablet 3   amLODipine (NORVASC) 5 MG tablet TAKE 1 TABLET BY MOUTH ONCE DAILY 90 tablet 2   amoxicillin (AMOXIL) 500 MG capsule TAKE 4 CAPSULES BY MOUTH 1 HOUR PRIOR TO DENTAL APPOINTMENT. 12 capsule 0   Cyanocobalamin (VITAMIN B-12 IJ) Inject 1 Dose as directed every 30 (thirty) days.     cyclobenzaprine (FLEXERIL) 5 MG tablet Take 1 tablet by mouth every 8 hours as needed for muscle spasm 40 tablet 0   diclofenac Sodium (VOLTAREN) 1 % GEL Apply 1 Application topically 4 (four) times daily as needed (pain).     diphenhydrAMINE (BENADRYL) 25 MG tablet Take 25-50 mg by mouth every 6 (six) hours as needed for allergies.     EPINEPHrine (EPIPEN 2-PAK) 0.3 mg/0.3 mL IJ  SOAJ injection Inject as directed for anaphylaxis. 2 each 5   EPINEPHrine (EPIPEN) 0.3 mg/0.3 mL SOAJ injection Inject 0.3 mLs (0.3 mg total) into the muscle once. 1 Device 0   famotidine (PEPCID) 40 MG tablet Take 1 tablet by mouth at bedtime. Needs office visit. 90 tablet 3   Fremanezumab-vfrm (AJOVY) 225 MG/1.5ML SOAJ Inject 225 mg into the skin every 30 (thirty) days. 4.5  mL 3   gabapentin (NEURONTIN) 300 MG capsule Take 1 capsule (300 mg total) by mouth at bedtime. 90 capsule 5   guaiFENesin-codeine (ROBITUSSIN AC) 100-10 MG/5ML syrup Take 10 MLs by mouth  every 6 hours as needed 240 mL 0   hyoscyamine (LEVSIN SL) 0.125 MG SL tablet Dissolve 1 tablet under the tongue 3 times daily before meals. 90 tablet 1   loratadine (CLARITIN) 10 MG tablet Take 10 mg by mouth at bedtime.     losartan (COZAAR) 100 MG tablet Take 1 tablet (100 mg total) by mouth daily. 90 tablet 1   meclizine (ANTIVERT) 25 MG tablet Take 25 mg by mouth 3 (three) times daily as needed for dizziness.     montelukast (SINGULAIR) 10 MG tablet TAKE 1 TABLET BY MOUTH EVERY EVENING 90 tablet 2   omeprazole (PRILOSEC) 40 MG capsule Take 1 capsule (40 mg total) by mouth daily. 30 capsule 1   ondansetron (ZOFRAN-ODT) 4 MG disintegrating tablet Dissolve 1 tablet by mouth every 8 hours as needed for nausea or vomiting. 90 tablet 1   polyethylene glycol (MIRALAX / GLYCOLAX) 17 g packet Take 17 g by mouth daily.     rizatriptan (MAXALT-MLT) 10 MG disintegrating tablet Dissolve 1 tablet (10 mg total) by mouth as needed for migraine. May repeat in 2 hours if needed 9 tablet 11   tamsulosin (FLOMAX) 0.4 MG CAPS capsule Take 1 capsule (0.4 mg total) by mouth daily. (Patient taking differently: Take 0.4 mg by mouth daily as needed (kidney stones).) 30 capsule 2   vedolizumab (ENTYVIO) 300 MG injection Inject 300 mg into the vein every 8 (eight) weeks.     No current facility-administered medications for this visit.    Allergies as of  09/24/2022 - Review Complete 09/24/2022  Allergen Reaction Noted   Iodinated contrast media Anaphylaxis and Hives    Other Shortness Of Breath 05/28/2012   Wasp venom Anaphylaxis and Shortness Of Breath 08/06/2011   Pollen extract Swelling 05/28/2012   Adhesive [tape] Rash 06/03/2012   Hydromorphone hcl Nausea And Vomiting 08/13/2009   Omnipaque [iohexol] Hives and Nausea Only 01/02/2011    Family History  Problem Relation Age of Onset   Hypertension Mother    Atrial fibrillation Mother    Arthritis Mother    Lupus Father    COPD Father    Rheum arthritis Father    Sarcoidosis Father    Lupus Sister    Congestive Heart Failure Maternal Grandmother    Migraines Neg Hx    Headache Neg Hx     Social History   Socioeconomic History   Marital status: Married    Spouse name: Not on file   Number of children: 4   Years of education: Not on file   Highest education level: Not on file  Occupational History    Employer: [employer]    Comment: corporate safety  Tobacco Use   Smoking status: Never    Passive exposure: Never   Smokeless tobacco: Never  Vaping Use   Vaping Use: Never used  Substance and Sexual Activity   Alcohol use: No    Alcohol/week: 0.0 standard drinks of alcohol   Drug use: Never   Sexual activity: Yes    Birth control/protection: Surgical  Other Topics Concern   Not on file  Social History Narrative   Lives with husband and oldest son    Caffeine use: about 3 cups of coffee/day   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     Social Determinants of Health   Financial Resource Strain: Not on file  Food Insecurity: Not on file  Transportation Needs: Not on file  Physical Activity: Not on file  Stress: Not on file  Social Connections: Not on file   Review of systems General: negative for malaise, night sweats, fever, chills, weight loss Neck: Negative for lumps, goiter, pain and significant neck  swelling Resp: Negative for cough, wheezing, dyspnea at rest CV: Negative for chest pain, leg swelling, palpitations, orthopnea GI: denies melena, hematochezia,  vomiting, diarrhea, constipation, dysphagia, odyonophagia, or unintentional weight loss. +nausea +early satiety +thin stools  MSK: Negative for joint pain or swelling, back pain, and muscle pain. Derm: Negative for itching or rash Psych: Denies depression, anxiety, memory loss, confusion. No homicidal or suicidal ideation.  Heme: Negative for prolonged bleeding, bruising easily, and swollen nodes. Endocrine: Negative for cold or heat intolerance, polyuria, polydipsia and goiter. Neuro: negative for tremor, gait imbalance, syncope and seizures. The remainder of the review of systems is noncontributory.  Physical Exam: BP 122/82 (BP Location: Left Arm, Patient Position: Sitting, Cuff Size: Large)   Pulse 85   Temp (!) 96.6 F (35.9 C) (Temporal)   Ht 5' 11.5" (1.816 m)   Wt 265 lb 3.2 oz (120.3 kg)   BMI 36.47 kg/m  General:   Alert and oriented. No distress noted. Pleasant and cooperative.  Head:  Normocephalic and atraumatic. Eyes:  Conjuctiva clear without scleral icterus. Mouth:  Oral mucosa pink and moist. Good dentition. No lesions. Heart: Normal rate and rhythm, s1 and s2 heart sounds present.  Lungs: Clear lung sounds in all lobes. Respirations equal and unlabored. Abdomen:  +BS, soft,  and non-distended. Mild pressure with palpation but denies tenderness. No rebound or guarding. No HSM or masses noted. Derm: No palmar erythema or jaundice Msk:  Symmetrical without gross deformities. Normal posture. Extremities:  Without edema. Neurologic:  Alert and  oriented x4 Psych:  Alert and cooperative. Normal mood and affect.  Invalid input(s): "6 MONTHS"   ASSESSMENT: [name] is a 56 y.o. female presenting today for follow up of Crohn's disease and GERD  Crohn's disease: Disease appears well-controlled on  Entyvio every 8 weeks.  Last colonoscopy in June was without active inflammation.  Inflammatory markers in June were also within normal limits and her Entyvio levels were optimal.  Due to her ongoing thinner caliber stools, she had MR enterography in August which showed some possible concern for low-grade inflammation however findings thought to be consistent more secondary to nondistended nature of bowel loops instead.  She denies any rectal bleeding or mucus in stools.  Will update CBC, CMP, CRP and check vitamin D levels due to history of deficiency. Continue Entyvio q8w.  She does note some left lower quadrant pain and some diarrhea about 2 weeks ago, she wonders if this was a mild diverticulitis flare.  She had no associated fevers or chills.  Diarrhea has resolved.  Left lower quadrant pain is very minimal at this time and abdominal exam is benign. This potentially could have been a mild diverticulitis flare, we will keep an eye on things for now and hold off on imaging or antibiotics, if she has any recurrence or worsening of symptoms she will let me know.  GERD: well controlled on omeprazole 40 mg once daily and famotidine 40 mg every evening.  She is having diarrhea breakthrough symptoms.  Will continue  with current regimen and Reflux precautions.  Nausea/early satiety: Patient continues to have intermittent nausea 2-3 times per week as well as some early satiety, EGD earlier this year without findings to explain her symptoms.  Most days eating 1 meal per day due to feeling so full.  She notes often feeling that "food passes slowly through her digestive tract."  I discussed with her further evaluation with gastric emptying study to rule out underlying gastroparesis given her ongoing symptoms.  We will proceed with this.  She should eat small meals and continue with Zofran as needed.   PLAN:  Vit D, CMP, CBC, CRP  2. GES  3. Continue with Entyvio q8w 4. Continue levsin PRN 5. Continue omprazole  67m daily, and Pepcid 478m  All questions were answered, patient verbalized understanding and is in agreement with plan as outlined above.   Follow Up: 3 months   Rayssa Atha L. CaAlver SorrowMSN, APRN, AGNP-C Adult-Gerontology Nurse Practitioner [facility]  I have reviewed the note and agree with the APP's assessment as described in this progress note  If persistent abdominal discomfort and flatulence/bloating, can consider breath test for SIBO. Symptoms could also be related to overlapping IBS.  DaMaylon PeppersMD Gastroenterology and Hepatology CoJeff[facility] Centerastroenterology

## 2022-09-24 NOTE — Patient Instructions (Addendum)
It was so nice to see you! Lets get you scheduled for a gastric emptying study for further evaluation of your nausea and fullness You can continue to use levsin and zofran as needed We will continue with Entyvio at current dosage and update basic labs and vitamin D levels Please let me know if you have further diarrhea or worsening lower abdominal pain   Follow up 3 months

## 2022-09-24 NOTE — Telephone Encounter (Signed)
Pt informed that GES is scheduled for 10/06/22 at 8:00 am, arrive at 7:45 am, nothing to eat or drink after midnight, no stomach medications morning and expect to be there for about 4 hours. Verbalized understanding.

## 2022-09-25 LAB — COMPREHENSIVE METABOLIC PANEL
AG Ratio: 1.7 (calc) (ref 1.0–2.5)
ALT: 15 U/L (ref 6–29)
AST: 13 U/L (ref 10–35)
Albumin: 4.3 g/dL (ref 3.6–5.1)
Alkaline phosphatase (APISO): 58 U/L (ref 37–153)
BUN: 14 mg/dL (ref 7–25)
CO2: 25 mmol/L (ref 20–32)
Calcium: 9.2 mg/dL (ref 8.6–10.4)
Chloride: 110 mmol/L (ref 98–110)
Creat: 0.91 mg/dL (ref 0.50–1.03)
Globulin: 2.6 g/dL (calc) (ref 1.9–3.7)
Glucose, Bld: 104 mg/dL — ABNORMAL HIGH (ref 65–99)
Potassium: 3.9 mmol/L (ref 3.5–5.3)
Sodium: 143 mmol/L (ref 135–146)
Total Bilirubin: 0.4 mg/dL (ref 0.2–1.2)
Total Protein: 6.9 g/dL (ref 6.1–8.1)

## 2022-09-25 LAB — CBC WITH DIFFERENTIAL/PLATELET
Absolute Monocytes: 304 cells/uL (ref 200–950)
Basophils Absolute: 32 cells/uL (ref 0–200)
Basophils Relative: 0.7 %
Eosinophils Absolute: 101 cells/uL (ref 15–500)
Eosinophils Relative: 2.2 %
HCT: 38.7 % (ref 35.0–45.0)
Hemoglobin: 12.9 g/dL (ref 11.7–15.5)
Lymphs Abs: 1467 cells/uL (ref 850–3900)
MCH: 28.5 pg (ref 27.0–33.0)
MCHC: 33.3 g/dL (ref 32.0–36.0)
MCV: 85.6 fL (ref 80.0–100.0)
MPV: 10.6 fL (ref 7.5–12.5)
Monocytes Relative: 6.6 %
Neutro Abs: 2696 cells/uL (ref 1500–7800)
Neutrophils Relative %: 58.6 %
Platelets: 337 10*3/uL (ref 140–400)
RBC: 4.52 10*6/uL (ref 3.80–5.10)
RDW: 13 % (ref 11.0–15.0)
Total Lymphocyte: 31.9 %
WBC: 4.6 10*3/uL (ref 3.8–10.8)

## 2022-09-25 LAB — C-REACTIVE PROTEIN: CRP: 3.3 mg/L (ref <8.0)

## 2022-09-25 LAB — VITAMIN D 25 HYDROXY (VIT D DEFICIENCY, FRACTURES): Vit D, 25-Hydroxy: 49 ng/mL (ref 30–100)

## 2022-09-30 LAB — QUANTIFERON-TB GOLD PLUS
QuantiFERON Mitogen Value: >10 IU/mL
QuantiFERON Nil Value: 0.04 IU/mL
QuantiFERON TB1 Ag Value: 0.05 IU/mL
QuantiFERON TB2 Ag Value: 0.04 IU/mL
QuantiFERON-TB Gold Plus: NEGATIVE

## 2022-09-30 LAB — HEPATITIS B SURFACE ANTIGEN: Hepatitis B Surface Ag: NEGATIVE

## 2022-10-01 DIAGNOSIS — K5000 Crohn's disease of small intestine without complications: Secondary | ICD-10-CM | POA: Diagnosis not present

## 2022-10-02 ENCOUNTER — Ambulatory Visit (HOSPITAL_COMMUNITY)
Admission: RE | Admit: 2022-10-02 | Discharge: 2022-10-02 | Disposition: A | Discharge status: Home / Self Care | Payer: 59 | Source: Ambulatory Visit | Attending: Gastroenterology | Admitting: Gastroenterology

## 2022-10-02 ENCOUNTER — Encounter (HOSPITAL_COMMUNITY): Payer: Self-pay

## 2022-10-02 DIAGNOSIS — R6881 Early satiety: Secondary | ICD-10-CM | POA: Diagnosis not present

## 2022-10-02 DIAGNOSIS — R110 Nausea: Secondary | ICD-10-CM | POA: Diagnosis not present

## 2022-10-02 DIAGNOSIS — Z0389 Encounter for observation for other suspected diseases and conditions ruled out: Secondary | ICD-10-CM | POA: Diagnosis not present

## 2022-10-02 MED ORDER — TECHNETIUM TC 99M SULFUR COLLOID
2.0000 | Freq: Once | INTRAVENOUS | Status: AC | PRN
  Administered 2022-10-02: 2.2 via ORAL

## 2022-10-06 ENCOUNTER — Other Ambulatory Visit (HOSPITAL_COMMUNITY): Payer: Commercial Managed Care - PPO

## 2022-10-10 ENCOUNTER — Other Ambulatory Visit (HOSPITAL_COMMUNITY): Payer: Self-pay

## 2022-10-16 ENCOUNTER — Encounter (HOSPITAL_COMMUNITY): Payer: Self-pay

## 2022-10-16 ENCOUNTER — Other Ambulatory Visit: Payer: Self-pay

## 2022-10-16 ENCOUNTER — Emergency Department (HOSPITAL_COMMUNITY)
Admission: EM | Admit: 2022-10-16 | Discharge: 2022-10-16 | Disposition: A | Discharge status: Home / Self Care | Payer: Commercial Managed Care - PPO | Attending: Emergency Medicine | Admitting: Emergency Medicine

## 2022-10-16 ENCOUNTER — Emergency Department (HOSPITAL_COMMUNITY): Payer: Commercial Managed Care - PPO

## 2022-10-16 DIAGNOSIS — R0602 Shortness of breath: Secondary | ICD-10-CM

## 2022-10-16 DIAGNOSIS — R0789 Other chest pain: Secondary | ICD-10-CM | POA: Insufficient documentation

## 2022-10-16 DIAGNOSIS — R931 Abnormal findings on diagnostic imaging of heart and coronary circulation: Secondary | ICD-10-CM | POA: Insufficient documentation

## 2022-10-16 DIAGNOSIS — Z79899 Other long term (current) drug therapy: Secondary | ICD-10-CM | POA: Diagnosis not present

## 2022-10-16 DIAGNOSIS — R079 Chest pain, unspecified: Secondary | ICD-10-CM

## 2022-10-16 LAB — BASIC METABOLIC PANEL
Anion gap: 8 (ref 5–15)
BUN: 15 mg/dL (ref 6–20)
CO2: 21 mmol/L — ABNORMAL LOW (ref 22–32)
Calcium: 8.9 mg/dL (ref 8.9–10.3)
Chloride: 110 mmol/L (ref 98–111)
Creatinine, Ser: 0.86 mg/dL (ref 0.44–1.00)
GFR, Estimated: >60 mL/min (ref >60)
Glucose, Bld: 103 mg/dL — ABNORMAL HIGH (ref 70–99)
Potassium: 3.4 mmol/L — ABNORMAL LOW (ref 3.5–5.1)
Sodium: 139 mmol/L (ref 135–145)

## 2022-10-16 LAB — BRAIN NATRIURETIC PEPTIDE: B Natriuretic Peptide: 4 pg/mL (ref 0.0–100.0)

## 2022-10-16 LAB — TROPONIN I (HIGH SENSITIVITY)
Troponin I (High Sensitivity): <2 ng/L (ref <18)
Troponin I (High Sensitivity): 3 ng/L (ref <18)

## 2022-10-16 LAB — CBC
HCT: 40.6 % (ref 36.0–46.0)
Hemoglobin: 12.9 g/dL (ref 12.0–15.0)
MCH: 28.7 pg (ref 26.0–34.0)
MCHC: 31.8 g/dL (ref 30.0–36.0)
MCV: 90.4 fL (ref 80.0–100.0)
Platelets: 349 10*3/uL (ref 150–400)
RBC: 4.49 MIL/uL (ref 3.87–5.11)
RDW: 14.2 % (ref 11.5–15.5)
WBC: 3.8 10*3/uL — ABNORMAL LOW (ref 4.0–10.5)
nRBC: 0 % (ref 0.0–0.2)

## 2022-10-16 NOTE — ED Provider Triage Note (Signed)
Emergency Medicine Provider Triage Evaluation Note  [name] , a 57 y.o. female  was evaluated in triage.  Pt complains of chest pain shortness of breath coming from the doctor's office with abnormal EKG.  Review of Systems  Positive: Chest pain or shortness of breath Negative: Fevers chills nausea vomiting diarrhea  Physical Exam  BP (!) 158/97 (BP Location: Right Arm)   Pulse 85   Temp 98.6 F (37 C) (Oral)   Resp 16   Ht 1.803 m ('5\' 11"'$ )   Wt 120.2 kg   SpO2 100%   BMI 36.96 kg/m  Gen:   Awake, no distress   Resp:  Normal effort  MSK:   Moves extremities without difficulty  Other:  Cardiac exam without murmurs, normal heart rate, normal pulses, no edema  Medical Decision Making  Medically screening exam initiated at 3:10 PM.  Appropriate orders placed.  [name] was informed that the remainder of the evaluation will be completed by another provider, this initial triage assessment does not replace that evaluation, and the importance of remaining in the ED until their evaluation is complete.  Patient has history of coronary abnormalities with elevated troponins in the past but no history of obstructive disease, comes from doctor's office with abnormal EKG.  Abnormal EKG from prehospital shows T wave inversions in leads V1, V2, V3, not seen on prior EKGs.  EKG done here does not show this but does not fact show that she has subtle T wave inversions in leads I and aVL.   Noemi Chapel, MD 10/16/22 [phone]

## 2022-10-16 NOTE — Discharge Instructions (Signed)
I spoke with the cardiologist - Dr. Johnsie Cancel - wants you to follow up in the office - and they will contact you for appointment.  Your testing here was normal, no signs of heart attack, heart failure or blood clot.  Your EKG is similar to what it has been in the past years.    ER for worsening symptoms.  Thank you for allowing [place] to treat you in the emergency department today.  After reviewing your examination and potential testing that was done it appears that you are safe to go home.  I would like for you to follow-up with your doctor within the next several days, have them obtain your results and follow-up with them to review all of these tests.  If you should develop severe or worsening symptoms return to the emergency department immediately

## 2022-10-16 NOTE — ED Triage Notes (Signed)
Pt to ED from PCP , reports told to come to ED for abnormal EKG. Pt also reports SHOB x 1 weeks and "chest pressure" , worse with exertion. No s/s of acute distress noted in triage.

## 2022-10-16 NOTE — ED Provider Notes (Signed)
**[name] De-Identified via Obfuscation** [name] [name]   CSN: [id number] Arrival date & time: 10/16/22  1223     History {Add pertinent medical, surgical, social history, [name] Chief Complaint  Patient presents with   Abnormal ECG   Shortness of Breath    [name] [name] is a 57 y.o. female.   Shortness of Breath  This patient is a 57 year old female, she has a medical history that is somewhat complicated from both a normal and cardiac standpoint, review of the medical record shows that the patient had an echocardiogram performed in 2017, and showed that she had an ejection fraction of 55 to 60%, there was no regional wall motion abnormalities and the left diastolic function parameters were normal.  Ultrasound study of the legs showing no DVT.  I have Scarry the medical record and I find no access to any history of catheterization reports.  She does have a reported history of having a myocardial infarction but tells me that she had a heart catheterization showing no obstructive disease.  She states that Dr. Ronna Polio is her cardiologist.  She reports that over the last week or so she has had some intermittent pain in the left chest, seem to get worse at certain times, there is often and exertional shortness of breath goes along with it.  When she went to her family doctor's office today she was noted to have an abnormal EKG and thus was redirected to the emergency department.  I have personally viewed the EKG from the office which showed T wave inversions in leads V1, V2 and V3 as well as flattening in V4.  She had flattening in aVL.  This was performed October 15, 2022 at 2:44 PM    Home Medications Prior to Admission medications   Medication Sig Start Date End Date Taking? Authorizing Provider  acetaminophen (TYLENOL) 500 MG tablet Take 2 tablets (1,000 mg total) by mouth every 8 (eight) hours. Patient taking differently: Take 1,000 mg by mouth every 8 (eight) hours as  needed for moderate pain. 06/07/19   Danae Orleans, PA-C  acetaZOLAMIDE ER (DIAMOX) 500 MG capsule Take 1 capsule by mouth 3 times daily. Patient taking differently: Take 500-1,000 mg by mouth See admin instructions. Take 500 mg in the morning and 1000 mg at night 03/11/22   Lomax, Amy, NP  albuterol (PROVENTIL) (2.5 MG/3ML) 0.083% nebulizer solution Inhale the contents of 1 vial via nebulizer every 6 hours as needed 05/05/22     albuterol (VENTOLIN HFA) 108 (90 Base) MCG/ACT inhaler Inhale 1-2 puffs by mouth every 4 hours as needed 05/05/22     amitriptyline (ELAVIL) 25 MG tablet TAKE 1 TABLET BY MOUTH AT BEDTIME 03/11/22   Lomax, Amy, NP  amLODipine (NORVASC) 5 MG tablet TAKE 1 TABLET BY MOUTH ONCE DAILY 06/11/22 06/11/23    amoxicillin (AMOXIL) 500 MG capsule TAKE 4 CAPSULES BY MOUTH 1 HOUR PRIOR TO DENTAL APPOINTMENT. 03/18/22     Cyanocobalamin (VITAMIN B-12 IJ) Inject 1 Dose as directed every 30 (thirty) days.    [provider]  cyclobenzaprine (FLEXERIL) 5 MG tablet Take 1 tablet by mouth every 8 hours as needed for muscle spasm 07/06/22     diclofenac Sodium (VOLTAREN) 1 % GEL Apply 1 Application topically 4 (four) times daily as needed (pain).    [provider]  diphenhydrAMINE (BENADRYL) 25 MG tablet Take 25-50 mg by mouth every 6 (six) hours as needed for allergies.    [provider]  EPINEPHrine (EPIPEN 2-PAK) 0.3 mg/0.3 mL IJ SOAJ injection Inject as directed for anaphylaxis. 05/07/22     EPINEPHrine (EPIPEN) 0.3 mg/0.3 mL SOAJ injection Inject 0.3 mLs (0.3 mg total) into the muscle once. 12/15/13   Linton Flemings, MD  famotidine (PEPCID) 40 MG tablet Take 1 tablet by mouth at bedtime. Needs office visit. 11/11/21   Rehman, Mechele Dawley, MD  Fremanezumab-vfrm (AJOVY) 225 MG/1.5ML SOAJ Inject 225 mg into the skin every 30 (thirty) days. 09/15/22   Lomax, Amy, NP  gabapentin (NEURONTIN) 300 MG capsule Take 1 capsule (300 mg total) by mouth at bedtime. 04/27/22   Carole Civil, MD   guaiFENesin-codeine [facility]) 100-10 MG/5ML syrup Take 10 MLs by mouth  every 6 hours as needed 02/17/22     hyoscyamine (LEVSIN SL) 0.125 MG SL tablet Dissolve 1 tablet under the tongue 3 times daily before meals. 07/22/22   Carlan, Chelsea L, NP  loratadine (CLARITIN) 10 MG tablet Take 10 mg by mouth at bedtime.    [provider]  losartan (COZAAR) 100 MG tablet Take 1 tablet (100 mg total) by mouth daily. 07/23/22     meclizine (ANTIVERT) 25 MG tablet Take 25 mg by mouth 3 (three) times daily as needed for dizziness.    [provider]  montelukast (SINGULAIR) 10 MG tablet TAKE 1 TABLET BY MOUTH EVERY EVENING 04/28/21     omeprazole (PRILOSEC) 40 MG capsule Take 1 capsule (40 mg total) by mouth daily. 09/24/22   Carlan, Chelsea L, NP  ondansetron (ZOFRAN-ODT) 4 MG disintegrating tablet Dissolve 1 tablet by mouth every 8 hours as needed for nausea or vomiting. 04/01/22   Montez Morita, Quillian Quince, MD  polyethylene glycol (MIRALAX / GLYCOLAX) 17 g packet Take 17 g by mouth daily.    [provider]  rizatriptan (MAXALT-MLT) 10 MG disintegrating tablet Dissolve 1 tablet (10 mg total) by mouth as needed for migraine. May repeat in 2 hours if needed 03/11/22   Lomax, Amy, NP  tamsulosin (FLOMAX) 0.4 MG CAPS capsule Take 1 capsule (0.4 mg total) by mouth daily. Patient taking differently: Take 0.4 mg by mouth daily as needed (kidney stones). 07/10/21     vedolizumab (ENTYVIO) 300 MG injection Inject 300 mg into the vein every 8 (eight) weeks. 11/11/21   Rogene Houston, MD      Allergies    Iodinated contrast media, Other, Wasp venom, Pollen extract, Adhesive [tape], Hydromorphone hcl, and Omnipaque [iohexol]    Review of Systems   Review of Systems  Respiratory:  Positive for shortness of breath.   All other systems reviewed and are negative.   Physical Exam Updated Vital Signs BP (!) 158/97 (BP Location: Right Arm)   Pulse 85   Temp 98.6 F (37 C) (Oral)    Resp 16   Ht 1.803 m ('5\' 11"'$ )   Wt 120.2 kg   SpO2 100%   BMI 36.96 kg/m  Physical Exam Vitals and nursing [name] reviewed.  Constitutional:      General: She is not in acute distress.    Appearance: She is well-developed.  HENT:     Head: Normocephalic and atraumatic.     Mouth/Throat:     Pharynx: No oropharyngeal exudate.  Eyes:     General: No scleral icterus.       Right eye: No discharge.        Left eye: No discharge.     Conjunctiva/sclera: Conjunctivae normal.     Pupils: Pupils are equal,  round, and reactive to light.  Neck:     Thyroid: No thyromegaly.     Vascular: No JVD.  Cardiovascular:     Rate and Rhythm: Normal rate and regular rhythm.     Heart sounds: Normal heart sounds. No murmur heard.    No friction rub. No gallop.  Pulmonary:     Effort: Pulmonary effort is normal. No respiratory distress.     Breath sounds: Normal breath sounds. No wheezing or rales.  Abdominal:     General: Bowel sounds are normal. There is no distension.     Palpations: Abdomen is soft. There is no mass.     Tenderness: There is no abdominal tenderness.  Musculoskeletal:        General: No tenderness. Normal range of motion.     Cervical back: Normal range of motion and neck supple.     Right lower leg: No edema.     Left lower leg: No edema.  Lymphadenopathy:     Cervical: No cervical adenopathy.  Skin:    General: Skin is warm and dry.     Findings: No erythema or rash.  Neurological:     Mental Status: She is alert.     Coordination: Coordination normal.  Psychiatric:        Behavior: Behavior normal.     ED Results / Procedures / Treatments   Labs (all labs ordered are listed, but only abnormal results are displayed) Labs Reviewed  BASIC METABOLIC PANEL - Abnormal; Notable for the following components:      Result Value   Potassium 3.4 (*)    CO2 21 (*)    Glucose, Bld 103 (*)    All other components within normal limits  CBC - Abnormal; Notable for the  following components:   WBC 3.8 (*)    All other components within normal limits  TROPONIN I (HIGH SENSITIVITY)  TROPONIN I (HIGH SENSITIVITY)    EKG EKG Interpretation  Date/Time:  Friday October 16 2022 12:35:45 EST Ventricular Rate:  77 PR Interval:  184 QRS Duration: 88 QT Interval:  392 QTC Calculation: 443 R Axis:   -2 Text Interpretation: Normal sinus rhythm Low voltage QRS Cannot rule out Anterior infarct , age undetermined Abnormal ECG When compared with ECG of 15-Jan-2021 14:50, Minimal criteria for Anterior infarct are now Present Confirmed by Noemi Chapel [phone]) on 10/16/2022 3:02:37 PM  Radiology DG Chest 2 View  Result Date: 10/16/2022 CLINICAL DATA:  Shortness of breath EXAM: CHEST - 2 VIEW COMPARISON:  09/26/2018 FINDINGS: The heart size and mediastinal contours are within normal limits. Both lungs are clear. The visualized skeletal structures are unremarkable. IMPRESSION: No active cardiopulmonary disease. Electronically Signed   By: Davina Poke D.O.   On: 10/16/2022 13:10    Procedures Procedures  {Document cardiac monitor, telemetry assessment procedure when appropriate:1}  Medications Ordered in ED Medications - No data to display  ED Course/ Medical Decision Making/ A&P   {   Click here for ABCD2, HEART and other calculatorsREFRESH [name] before signing :1}                          Medical Decision Making Amount and/or Complexity of Data Reviewed Labs: ordered. Radiology: ordered.   This patient presents to the ED for concern of chest pain and shortness of breath, this involves an extensive number of treatment options, and is a complaint that carries with it a high risk of complications  and morbidity.  The differential diagnosis includes coronary disease, pulmonary embolism, chest wall pain, pleurisy, infectious etiologies   Co morbidities that complicate the patient evaluation  Patient takes amlodipine, losartan for blood pressure, multiple  gastroenterology acid reflux medications, she takes amitriptyline at bedtime, she takes albuterol, hyoscyamine, omeprazole and as needed rizatriptan.   Additional history obtained:  Additional history obtained from electronic medical record External records from outside source obtained and reviewed including prior records, see HPI including echocardiogram   Lab Tests:  I Ordered, and personally interpreted labs.  The pertinent results include: CBC metabolic panel and troponin all rather unremarkable without any elevations   Imaging Studies ordered:  I ordered imaging studies including chest x-ray I independently visualized and interpreted imaging which showed totally normal without any acute findings I agree with the radiologist interpretation   Cardiac Monitoring: / EKG:  The patient was maintained on a cardiac monitor.  I personally viewed and interpreted the cardiac monitored which showed an underlying rhythm of: Normal sinus rhythm but EKG did show some T wave abnormalities in leads limb lead I and aVL   Consultations Obtained:  I requested consultation with the cardiologist Dr. Jenkins Rouge who recommends that the patient have a BNP and a CT scan to rule out pulmonary embolism.  I discussed with him that she does have a anaphylactic reaction to CT contrast thus it was discussed that we do not do that but rather just a BMP,  and discussed lab and imaging findings as well as pertinent plan - they recommend: BNP   Problem List / ED Course / Critical interventions / Medication management  *** I ordered medication including ***  for ***  Reevaluation of the patient after these medicines showed that the patient {resolved/improved/worsened:23923::"improved"} I have reviewed the patients home medicines and have made adjustments as needed   Social Determinants of Health:  ***   Test / Admission - Considered:  ***   {Document critical care time when  appropriate:1} {Document review of labs and clinical decision tools ie heart score, Chads2Vasc2 etc:1}  {Document your independent review of radiology images, and any outside records:1} {Document your discussion with family members, caretakers, and with consultants:1} {Document social determinants of health affecting pt's care:1} {Document your decision making why or why not admission, treatments were needed:1} Final Clinical Impression(s) / ED Diagnoses Final diagnoses:  None    Rx / DC Orders ED Discharge Orders     None

## 2022-10-19 ENCOUNTER — Other Ambulatory Visit (HOSPITAL_COMMUNITY): Payer: Self-pay

## 2022-10-19 MED ORDER — CYCLOBENZAPRINE HCL 5 MG PO TABS
ORAL_TABLET | ORAL | 1 refills | Status: DC
  Filled 2022-10-19: qty 40, 14d supply, fill #0
  Filled 2022-12-27: qty 40, 14d supply, fill #1

## 2022-10-20 ENCOUNTER — Other Ambulatory Visit (HOSPITAL_COMMUNITY): Payer: Self-pay

## 2022-10-20 ENCOUNTER — Other Ambulatory Visit: Payer: Self-pay

## 2022-10-23 ENCOUNTER — Other Ambulatory Visit (HOSPITAL_COMMUNITY): Payer: Self-pay

## 2022-11-04 ENCOUNTER — Telehealth: Payer: Commercial Managed Care - PPO | Admitting: Physician Assistant

## 2022-11-04 ENCOUNTER — Other Ambulatory Visit (HOSPITAL_COMMUNITY): Payer: Self-pay

## 2022-11-04 DIAGNOSIS — J069 Acute upper respiratory infection, unspecified: Secondary | ICD-10-CM | POA: Diagnosis not present

## 2022-11-04 MED ORDER — FLUTICASONE PROPIONATE 50 MCG/ACT NA SUSP
2.0000 | Freq: Every day | NASAL | 0 refills | Status: DC
  Filled 2022-11-04: qty 16, 30d supply, fill #0

## 2022-11-04 NOTE — Progress Notes (Signed)
I have spent 5 minutes in review of e-visit questionnaire, review and updating patient chart, medical decision making and response to patient.   Delecia Vastine Cody Cesily Cuoco, PA-C    

## 2022-11-04 NOTE — Progress Notes (Signed)

## 2022-11-13 DIAGNOSIS — D511 Vitamin B12 deficiency anemia due to selective vitamin B12 malabsorption with proteinuria: Secondary | ICD-10-CM | POA: Diagnosis not present

## 2022-11-19 NOTE — Progress Notes (Signed)
CARDIOLOGY CONSULT NOTE       Patient ID: BEATRICE MIRE MRN: [id number] DOB/AGE: 01/17/1966 57 y.o.  Admit date: (Not on file) Referring Physician: Hilma Favors  Primary Physician: Sharilyn Sites, MD Primary Cardiologist: New Reason for Consultation: Chest pain    HPI:  57 y.o. referred for chest pain by Dr Hilma Favors Vague past cardiac history Seen in ED 10/16/22 for dyspnea and atypical chest pain She has chronically abnormal ECG with precordial T wave changes She r/o BNP normal  CXR NAD No CTA done due to contrast allergy She indicates seeing Dr Jeffie Pollock in past with MI but cath normal 2005 and 20011 TTE done 04/27/16 was normal with EF 55-60%   She works at [employer] in [place] has concerns about her heart Some exertional dyspnea and atypical pains   ROS All other systems reviewed and negative except as noted above  Past Medical History:  Diagnosis Date   Anxiety    Arthritis    Asthma    Cervical hyperplasia    hisotry of   Chest pain    cath 2005 norm cors, repeat cath Feb 2011 normal cors and only minimally  elevated pulmonary pressures   Crohn disease ([place])    Diverticulitis of colon 07/17/2014   Diverticulosis    Dyspnea    GERD (gastroesophageal reflux disease)    Hiatal hernia    History of iron deficiency anemia    Hypertension    Idiopathic intracranial hypertension    Per patient   Migraines    Obesity    Palpitations    Pneumonia 2008   14 days hospitalization, bilateral   PONV (postoperative nausea and vomiting)    Sleep apnea sleep study 11/03/2009    cpap; does sometimes, doesnt use every night. weight loss no longer using CPAP   Vitamin B deficiency     Family History  Problem Relation Age of Onset   Hypertension Mother    Atrial fibrillation Mother    Arthritis Mother    Lupus Father    COPD Father    Rheum arthritis Father    Sarcoidosis Father    Lupus Sister    Congestive Heart Failure Maternal Grandmother    Migraines Neg Hx     Headache Neg Hx     Social History   Socioeconomic History   Marital status: Married    Spouse name: Not on file   Number of children: 4   Years of education: Not on file   Highest education level: Not on file  Occupational History    Employer: [employer]    Comment: corporate safety  Tobacco Use   Smoking status: Never    Passive exposure: Never   Smokeless tobacco: Never  Vaping Use   Vaping Use: Never used  Substance and Sexual Activity   Alcohol use: No    Alcohol/week: 0.0 standard drinks of alcohol   Drug use: Never   Sexual activity: Yes    Birth control/protection: Surgical  Other Topics Concern   Not on file  Social History Narrative   Lives with husband and oldest son    Caffeine use: about 3 cups of coffee/day   She works in Chief Operating Officer with [employer]   She has 4 children age 3-22.     Social Determinants of Health   Financial Resource Strain: Not on file  Food Insecurity: Not on file  Transportation Needs: Not on file  Physical Activity: Not on file  Stress: Not on  file  Social Connections: Not on file  Intimate Partner Violence: Not on file    Past Surgical History:  Procedure Laterality Date   ABDOMINAL HYSTERECTOMY     ANKLE ARTHROSCOPY  06/01/2012   Procedure: ANKLE ARTHROSCOPY;  Surgeon: Colin Rhein, MD;  Location: [facility];  Service: Orthopedics;  Laterality: Left;  left ankle arthorsocpy with extensive debridement and gastroc slide   ANKLE SURGERY Left 05/11/2018   BIOPSY  04/26/2019   Procedure: BIOPSY;  Surgeon: Rogene Houston, MD;  [facility];  Service: Endoscopy;;  ileum colon   BIOPSY  03/25/2022   Procedure: BIOPSY;  Surgeon: Harvel Quale, MD;  [facility];  Service: Gastroenterology;;   CARDIAC CATHETERIZATION  11/22/2009 and 2005   WNL   CESAREAN SECTION     CHOLECYSTECTOMY  09/08/2006   lap. chole.   CHONDROPLASTY  06/17/2012   Procedure: CHONDROPLASTY;   Surgeon: Carole Civil, MD;  [facility]: [facility];  Service: Orthopedics;  Laterality: Right;  right patella   COLONOSCOPY N/A 04/26/2019   Rehman: a few erosions in ternimal ileum, two erosions in cecum (assocaited with acute inflammation) diverticulosis in sigmoid hepatic flexure and ascending colon, external hemorrhoids   COLONOSCOPY WITH PROPOFOL N/A 03/25/2022   Procedure: COLONOSCOPY WITH PROPOFOL;  Surgeon: Harvel Quale, MD;  [facility];  Service: Gastroenterology;  Laterality: N/A;  830 ASA 1   ESOPHAGOGASTRODUODENOSCOPY N/A 05/14/2015   Procedure: ESOPHAGOGASTRODUODENOSCOPY (EGD);  Surgeon: Rogene Houston, MD;  [facility];  Service: Endoscopy;  Laterality: N/A;  730   ESOPHAGOGASTRODUODENOSCOPY (EGD) WITH PROPOFOL N/A 03/25/2022   Procedure: ESOPHAGOGASTRODUODENOSCOPY (EGD) WITH PROPOFOL;  Surgeon: Harvel Quale, MD;  [facility];  Service: Gastroenterology;  Laterality: N/A;   GIVENS CAPSULE STUDY N/A 05/24/2019   Procedure: GIVENS CAPSULE STUDY;  Surgeon: Rogene Houston, MD;  [facility];  Service: Endoscopy;  Laterality: N/A;  7:30   INGUINAL HERNIA REPAIR  10/30/2008   right   KNEE ARTHROSCOPY Right 2013   KNEE ARTHROSCOPY WITH LATERAL MENISECTOMY Left 12/23/2016   Procedure: LEFT KNEE ARTHROSCOPY WITH LATERAL MENISECTOMY;  Surgeon: Carole Civil, MD;  [facility];  Service: Orthopedics;  Laterality: Left;   KNEE ARTHROSCOPY WITH MEDIAL MENISECTOMY Left 12/23/2016   Procedure: LEFT KNEE ARTHROSCOPY WITH MEDIAL MENISECTOMY CHONDROPLASTY PATELLA  AND MEDIAL FEMORAL CONDYLE LEFT KNEE;  Surgeon: Carole Civil, MD;  [facility];  Service: Orthopedics;  Laterality: Left;   POLYPECTOMY  03/25/2022   Procedure: POLYPECTOMY;  Surgeon: Harvel Quale, MD;  [facility];  Service: Gastroenterology;;   SHOULDER ARTHROSCOPY WITH SUBACROMIAL DECOMPRESSION AND BICEP TENDON REPAIR  Left 12/21/2017   Procedure: Left shoulder arthroscopic biceps tenodesis, SAD, DCR and labrum debridement;  Surgeon: Nicholes Stairs, MD;  Location: [facility];  Service: Orthopedics;  Laterality: Left;  120 mins   SHOULDER SURGERY     right x 2    TOTAL KNEE ARTHROPLASTY Left 06/06/2019   Procedure: TOTAL KNEE ARTHROPLASTY;  Surgeon: Paralee Cancel, MD;  Location[facility];  Service: Orthopedics;  Laterality: Left;  70 mins   TOTAL KNEE ARTHROPLASTY Right 01/23/2021   Procedure: TOTAL KNEE ARTHROPLASTY;  Surgeon: Paralee Cancel, MD;  Location: [facility];  Service: Orthopedics;  Laterality: Right;  70 mins      Current Outpatient Medications:    acetaminophen (TYLENOL) 500 MG tablet, Take 2 tablets (1,000 mg total) by mouth every 8 (  eight) hours. (Patient taking differently: Take 1,000 mg by mouth every 8 (eight) hours as needed for moderate pain.), Disp: 30 tablet, Rfl: 0   acetaZOLAMIDE ER (DIAMOX) 500 MG capsule, Take 1 capsule by mouth 3 times daily. (Patient taking differently: Take 500-1,000 mg by mouth See admin instructions. Take 500 mg in the morning and 1000 mg at night), Disp: 270 capsule, Rfl: 3   albuterol (PROVENTIL) (2.5 MG/3ML) 0.083% nebulizer solution, Inhale the contents of 1 vial via nebulizer every 6 hours as needed, Disp: 90 mL, Rfl: 1   albuterol (VENTOLIN HFA) 108 (90 Base) MCG/ACT inhaler, Inhale 1-2 puffs by mouth every 4 hours as needed, Disp: 6.7 g, Rfl: 2   amitriptyline (ELAVIL) 25 MG tablet, TAKE 1 TABLET BY MOUTH AT BEDTIME, Disp: 90 tablet, Rfl: 3   amLODipine (NORVASC) 5 MG tablet, TAKE 1 TABLET BY MOUTH ONCE DAILY, Disp: 90 tablet, Rfl: 2   Cyanocobalamin (VITAMIN B-12 IJ), Inject 1 Dose as directed every 30 (thirty) days., Disp: , Rfl:    cyclobenzaprine (FLEXERIL) 5 MG tablet, Take 1 tablet by mouth every 8 hours as needed for muscle spasm, Disp: 40 tablet, Rfl: 1   diclofenac Sodium (VOLTAREN) 1 % GEL, Apply 1 Application topically 4 (four) times daily as needed  (pain)., Disp: , Rfl:    diphenhydrAMINE (BENADRYL) 25 MG tablet, Take 25-50 mg by mouth every 6 (six) hours as needed for allergies., Disp: , Rfl:    EPINEPHrine (EPIPEN 2-PAK) 0.3 mg/0.3 mL IJ SOAJ injection, Inject as directed for anaphylaxis., Disp: 2 each, Rfl: 5   famotidine (PEPCID) 40 MG tablet, Take 1 tablet by mouth at bedtime. Needs office visit., Disp: 90 tablet, Rfl: 3   fluticasone (FLONASE) 50 MCG/ACT nasal spray, Place 2 sprays into both nostrils daily., Disp: 16 g, Rfl: 0   Fremanezumab-vfrm (AJOVY) 225 MG/1.5ML SOAJ, Inject 225 mg into the skin every 30 (thirty) days., Disp: 4.5 mL, Rfl: 3   gabapentin (NEURONTIN) 300 MG capsule, Take 1 capsule (300 mg total) by mouth at bedtime., Disp: 90 capsule, Rfl: 5   guaiFENesin-codeine (ROBITUSSIN AC) 100-10 MG/5ML syrup, Take 10 MLs by mouth  every 6 hours as needed, Disp: 240 mL, Rfl: 0   hyoscyamine (LEVSIN SL) 0.125 MG SL tablet, Dissolve 1 tablet under the tongue 3 times daily before meals., Disp: 90 tablet, Rfl: 1   loratadine (CLARITIN) 10 MG tablet, Take 10 mg by mouth at bedtime., Disp: , Rfl:    losartan (COZAAR) 100 MG tablet, Take 1 tablet (100 mg total) by mouth daily., Disp: 90 tablet, Rfl: 1   meclizine (ANTIVERT) 25 MG tablet, Take 25 mg by mouth 3 (three) times daily as needed for dizziness., Disp: , Rfl:    montelukast (SINGULAIR) 10 MG tablet, TAKE 1 TABLET BY MOUTH EVERY EVENING, Disp: 90 tablet, Rfl: 2   omeprazole (PRILOSEC) 40 MG capsule, Take 1 capsule (40 mg total) by mouth daily., Disp: 90 capsule, Rfl: 3   ondansetron (ZOFRAN-ODT) 4 MG disintegrating tablet, Dissolve 1 tablet by mouth every 8 hours as needed for nausea or vomiting., Disp: 90 tablet, Rfl: 1   polyethylene glycol (MIRALAX / GLYCOLAX) 17 g packet, Take 17 g by mouth daily., Disp: , Rfl:    rizatriptan (MAXALT-MLT) 10 MG disintegrating tablet, Dissolve 1 tablet (10 mg total) by mouth as needed for migraine. May repeat in 2 hours if needed, Disp: 9  tablet, Rfl: 11   tamsulosin (FLOMAX) 0.4 MG CAPS capsule, Take 1 capsule (0.4  mg total) by mouth daily. (Patient taking differently: Take 0.4 mg by mouth daily as needed (kidney stones).), Disp: 30 capsule, Rfl: 2   vedolizumab (ENTYVIO) 300 MG injection, Inject 300 mg into the vein every 8 (eight) weeks., Disp: , Rfl:     Physical Exam: Blood pressure 118/82, pulse 77, height 6' (1.829 m), weight 262 lb 3.2 oz (118.9 kg), SpO2 100 %.    Affect appropriate Healthy:  appears stated age 26: normal Neck supple with no adenopathy JVP normal no bruits no thyromegaly Lungs clear with no wheezing and good diaphragmatic motion Heart:  S1/S2 no murmur, no rub, gallop or click PMI normal Abdomen: benighn, BS positve, no tenderness, no AAA no bruit.  No HSM or HJR Distal pulses intact with no bruits No edema Neuro non-focal Skin warm and dry No muscular weakness   Labs:   Lab Results  Component Value Date   WBC 3.8 (L) 10/16/2022   HGB 12.9 10/16/2022   HCT 40.6 10/16/2022   MCV 90.4 10/16/2022   PLT 349 10/16/2022   No results for input(s): "NA", "K", "CL", "CO2", "BUN", "CREATININE", "CALCIUM", "PROT", "BILITOT", "ALKPHOS", "ALT", "AST", "GLUCOSE" in the last 168 hours.  Invalid input(s): "LABALBU" Lab Results  Component Value Date   CKTOTAL 100 07/11/2020   CKMB 1.0 08/02/2009   TROPONINI <0.03 04/26/2016    Lab Results  Component Value Date   CHOL 165 04/26/2016   Lab Results  Component Value Date   HDL 41 04/26/2016   Lab Results  Component Value Date   LDLCALC 88 04/26/2016   Lab Results  Component Value Date   TRIG 181 (H) 04/26/2016   Lab Results  Component Value Date   CHOLHDL 4.0 04/26/2016   No results found for: "LDLDIRECT"    Radiology: No results found.  EKG: 10/19/22 SR low voltage in precordium normal ST segments   ASSESSMENT AND PLAN:   Chest pain:  atypical ECG labile T wave in precordium non specific Shared decision making favor TTE  to r/o strucutral heart issue given 7 years since prior   HTN:  continue norvasc and losartan   Chrohn's :  small bowel with GERD and diverticulosis and multiple episodes of diverticulitis Still with frequent stools with Levsin and PRN Zofran Now on Entyvio f/u Last colonoscopy 03/25/22 Does have hiatal hernia as well which may contribute to atypical chest discomfort with her GERD   TTE PET/CT  F/U cardiology PRN   Signed: Jenkins Rouge 12/02/2022, 8:59 AM

## 2022-11-26 DIAGNOSIS — K5000 Crohn's disease of small intestine without complications: Secondary | ICD-10-CM | POA: Diagnosis not present

## 2022-12-02 ENCOUNTER — Ambulatory Visit: Payer: Commercial Managed Care - PPO | Attending: Cardiovascular Disease | Admitting: Cardiovascular Disease

## 2022-12-02 VITALS — BP 118/82 | HR 77 | Ht 72.0 in | Wt 262.2 lb

## 2022-12-02 DIAGNOSIS — R072 Precordial pain: Secondary | ICD-10-CM | POA: Diagnosis not present

## 2022-12-02 DIAGNOSIS — I10 Essential (primary) hypertension: Secondary | ICD-10-CM | POA: Diagnosis not present

## 2022-12-02 NOTE — Patient Instructions (Addendum)
Medication Instructions:  Your physician recommends that you continue on your current medications as directed. Please refer to the Current Medication list given to you today.  *If you need a refill on your cardiac medications before your next appointment, please call your pharmacy*  Lab Work: If you have labs (blood work) drawn today and your tests are completely normal, you will receive your results only by: [facility] (if you have MyChart) OR A paper copy in the mail If you have any lab test that is abnormal or we need to change your treatment, we will call you to review the results.  Testing/Procedures: Your physician has requested that you have cardiac PET/CT. Cardiac computed tomography (CT) is a painless test that uses an x-ray machine to take clear, detailed pictures of your heart. For further information please visit [email or web address]. Please follow instruction sheet as given.  Your physician has requested that you have an echocardiogram. Echocardiography is a painless test that uses sound waves to create images of your heart. It provides your doctor with information about the size and shape of your heart and how well your heart's chambers and valves are working. This procedure takes approximately one hour. There are no restrictions for this procedure. Please do NOT wear cologne, perfume, aftershave, or lotions (deodorant is allowed). Please arrive 15 minutes prior to your appointment time.  Follow-Up: At [facility], you and your health needs are our priority.  As part of our continuing mission to provide you with exceptional heart care, we have created designated Provider Care Teams.  These Care Teams include your primary Cardiologist (physician) and Advanced Practice Providers (APPs -  Physician Assistants and Nurse Practitioners) who all work together to provide you with the care you need, when you need it.  We recommend signing up for the patient portal called  "MyChart".  Sign up information is provided on this After Visit Summary.  MyChart is used to connect with patients for Virtual Visits (Telemedicine).  Patients are able to view lab/test results, encounter notes, upcoming appointments, etc.  Non-urgent messages can be sent to your provider as well.   To learn more about what you can do with MyChart, go to [email or web address].    Your next appointment:   As needed  Provider:   Jenkins Rouge, MD     How to Prepare for Your Cardiac PET/CT Stress Test:  1. Please do not take these medications before your test:   Medications that may interfere with the cardiac pharmacological stress agent (ex. nitrates - including erectile dysfunction medications, isosorbide mononitrate, tamulosin or beta-blockers) the day of the exam.  Theophylline containing medications for 12 hours. Dipyridamole 48 hours prior to the test. Your remaining medications may be taken with water.  2. Nothing to eat or drink, except water, 3 hours prior to arrival time.   NO caffeine/decaffeinated products, or chocolate 12 hours prior to arrival.  3. NO perfume, cologne or lotion  4. Total time is 1 to 2 hours; you may want to bring reading material for the waiting time.  5. Please report to Radiology at the [place] Entrance 30 minutes early for your test.  [place], [place] [address]   IF YOU THINK YOU MAY BE PREGNANT, OR ARE NURSING PLEASE INFORM THE TECHNOLOGIST.  In preparation for your appointment, medication and supplies will be purchased.  Appointment availability is limited, so if you need to cancel or reschedule, please call the Radiology Department at [phone]  24 hours in advance to avoid a cancellation fee of $100.00  What to Expect After you Arrive:  Once you arrive and check in for your appointment, you will be taken to a preparation room within the Radiology Department.  A technologist or Nurse will obtain your  medical history, verify that you are correctly prepped for the exam, and explain the procedure.  Afterwards,  an IV will be started in your arm and electrodes will be placed on your skin for EKG monitoring during the stress portion of the exam. Then you will be escorted to the PET/CT scanner.  There, staff will get you positioned on the scanner and obtain a blood pressure and EKG.  During the exam, you will continue to be connected to the EKG and blood pressure machines.  A small, safe amount of a radioactive tracer will be injected in your IV to obtain a series of pictures of your heart along with an injection of a stress agent.    After your Exam:  It is recommended that you eat a meal and drink a caffeinated beverage to counter act any effects of the stress agent.  Drink plenty of fluids for the remainder of the day and urinate frequently for the first couple of hours after the exam.  Your doctor will inform you of your test results within 7-10 business days.  For questions about your test or how to prepare for your test, please call: Marchia Bond, Cardiac Imaging Nurse Navigator  Gordy Clement, Cardiac Imaging Nurse Navigator Office: [phone]

## 2022-12-24 ENCOUNTER — Ambulatory Visit (INDEPENDENT_AMBULATORY_CARE_PROVIDER_SITE_OTHER): Payer: Commercial Managed Care - PPO | Admitting: Gastroenterology

## 2022-12-24 ENCOUNTER — Encounter (INDEPENDENT_AMBULATORY_CARE_PROVIDER_SITE_OTHER): Payer: Self-pay | Admitting: Gastroenterology

## 2022-12-24 VITALS — BP 129/85 | HR 80 | Temp 97.3°F | Ht 72.0 in | Wt 263.8 lb

## 2022-12-24 DIAGNOSIS — R319 Hematuria, unspecified: Secondary | ICD-10-CM

## 2022-12-24 DIAGNOSIS — R1031 Right lower quadrant pain: Secondary | ICD-10-CM

## 2022-12-24 DIAGNOSIS — R109 Unspecified abdominal pain: Secondary | ICD-10-CM

## 2022-12-24 NOTE — Patient Instructions (Signed)
Let's check a urine to see if that shows anything, if it does, I will have you see your PCP, if I do not see anything concerning in the urine, we may do some imaging to get a better idea of what is causing your pain.  If you develop worsening pain, vomiting that does not subside, fevers, rectal bleeding, please make me aware

## 2022-12-24 NOTE — Progress Notes (Signed)
Referring Provider: Sharilyn Sites, MD Primary Care Physician:  Sharilyn Sites, MD Primary GI Physician: Jenetta Downer  Chief Complaint  Patient presents with   Follow-up    Patient here today for a follow up on her Crohn's. Patient says she has been having some Right lower abdominal pain, nausea, loose stools. No blood seen, and no fevers noted. Symptoms on going for the last week. She is on Entyvio Q 8 weeks, she gets this done at [facility] in [place].   HPI:   [name] is a 57 y.o. female with past medical history of asthma, small bowel Crohn's disease, GERD, hypertension, obesity, diverticulosis and multiple episodes diverticulitis   Patient presenting today for follow up of Crohn's disease  Last seen in December 2023, at that time,  still having thinner stools.she feels like she still has to strain to defecate. She continues to feel that food passes slowly through her system. She has some nausea at times, maybe 2-3x per week. She sometimes tries to eat later in the day to avoid feeling nauseated or full. She states that her husband said she has a lot of flatulence at night but she does not notice this during the day. She has some abdominal discomfort and bloating at times, occasional stomach cramping and will take levsin for with some relief. She was previously taking miralax to help with needing to strain, she did not feel that it helped very much.    She feels that she may have had a diverticulitis flare about 2 weeks ago with some LLQ pain. She was having diarrhea as well. No rectal bleeding, No fevers or chills. LLQ pain has almost resolved now. Diarrhea has resolved and stools are back to baseline. She used levsin during this time with some improvement.    She notes she is at her heaviest weight wise and is trying to work on this.   GERD is well controlled on omeprazole 40mg  daily and famotidine 40mg  nightly. Has had about 2 episodes of acid regurgitation  since her last visit. No dysphagia or odynophagia.     Present:  She reports she began having some Right sided abdominal pain/R flank pain, nausea, fatigue last week. She has been using a heating pad to help some with pain. She does note that she thinks she passed a kidney stone a few weeks ago as she had frank hematuria, she is still having darker urine, tea colored. She did note a few specks of blood in her urine prior to frank hematuria, she had some mild burning with urination and some pressure in lower mid abdomen as well. She notes that she has been more fatigued for about a week. She has looser/thinner stools at baseline though now having about 3-4 BMs per day, which is mildly increased some from baseline. No rectal bleeding or melena. Appetite has been somewhat decreased. She has had some chills but no fever.   Last Colonoscopy:03/25/22- One 4 mm polyp in the cecum, removed with a cold snare.-tubular adenoma  - Melanosis in the colon. - Diverticulosis in the entire examined colon. - The distal rectum and anal verge are normal on retroflexion view. Last Endoscopy:03/25/22- 3 cm hiatal hernia. - Non-obstructing Schatzki ring. - Normal stomach. - Normal examined Normal examined duodenum, biopsied-mild inflammation   Recommendations:  Repeat colon in 5 years   Past Medical History:  Diagnosis Date   Anxiety    Arthritis    Asthma    Cervical hyperplasia  hisotry of   Chest pain    cath 2005 norm cors, repeat cath Feb 2011 normal cors and only minimally  elevated pulmonary pressures   Crohn disease ([place])    Diverticulitis of colon 07/17/2014   Diverticulosis    Dyspnea    GERD (gastroesophageal reflux disease)    Hiatal hernia    History of iron deficiency anemia    Hypertension    Idiopathic intracranial hypertension    Per patient   Migraines    Obesity    Palpitations    Pneumonia 2008   14 days hospitalization, bilateral   PONV (postoperative nausea and vomiting)     Sleep apnea sleep study 11/03/2009    cpap; does sometimes, doesnt use every night. weight loss no longer using CPAP   Vitamin B deficiency     Past Surgical History:  Procedure Laterality Date   ABDOMINAL HYSTERECTOMY     ANKLE ARTHROSCOPY  06/01/2012   Procedure: ANKLE ARTHROSCOPY;  Surgeon: Colin Rhein, MD;  Location: [facility];  Service: Orthopedics;  Laterality: Left;  left ankle arthorsocpy with extensive debridement and gastroc slide   ANKLE SURGERY Left 05/11/2018   BIOPSY  04/26/2019   Procedure: BIOPSY;  Surgeon: Rogene Houston, MD;  [facility];  Service: Endoscopy;;  ileum colon   BIOPSY  03/25/2022   Procedure: BIOPSY;  Surgeon: Harvel Quale, MD;  [facility];  Service: Gastroenterology;;   CARDIAC CATHETERIZATION  11/22/2009 and 2005   WNL   CESAREAN SECTION     CHOLECYSTECTOMY  09/08/2006   lap. chole.   CHONDROPLASTY  06/17/2012   Procedure: CHONDROPLASTY;  Surgeon: Carole Civil, MD;  Location: [facility];  Service: Orthopedics;  Laterality: Right;  right patella   COLONOSCOPY N/A 04/26/2019   Rehman: a few erosions in ternimal ileum, two erosions in cecum (assocaited with acute inflammation) diverticulosis in sigmoid hepatic flexure and ascending colon, external hemorrhoids   COLONOSCOPY WITH PROPOFOL N/A 03/25/2022   Procedure: COLONOSCOPY WITH PROPOFOL;  Surgeon: Harvel Quale, MD;  [facility];  Service: Gastroenterology;  Laterality: N/A;  830 ASA 1   ESOPHAGOGASTRODUODENOSCOPY N/A 05/14/2015   Procedure: ESOPHAGOGASTRODUODENOSCOPY (EGD);  Surgeon: Rogene Houston, MD;  [facility];  Service: Endoscopy;  Laterality: N/A;  730   ESOPHAGOGASTRODUODENOSCOPY (EGD) WITH PROPOFOL N/A 03/25/2022   Procedure: ESOPHAGOGASTRODUODENOSCOPY (EGD) WITH PROPOFOL;  Surgeon: Harvel Quale, MD;  [facility];  Service: Gastroenterology;  Laterality: N/A;   GIVENS  CAPSULE STUDY N/A 05/24/2019   Procedure: GIVENS CAPSULE STUDY;  Surgeon: Rogene Houston, MD;  [facility];  Service: Endoscopy;  Laterality: N/A;  7:30   INGUINAL HERNIA REPAIR  10/30/2008   right   KNEE ARTHROSCOPY Right 2013   KNEE ARTHROSCOPY WITH LATERAL MENISECTOMY Left 12/23/2016   Procedure: LEFT KNEE ARTHROSCOPY WITH LATERAL MENISECTOMY;  Surgeon: Carole Civil, MD;  [facility];  Service: Orthopedics;  Laterality: Left;   KNEE ARTHROSCOPY WITH MEDIAL MENISECTOMY Left 12/23/2016   Procedure: LEFT KNEE ARTHROSCOPY WITH MEDIAL MENISECTOMY CHONDROPLASTY PATELLA  AND MEDIAL FEMORAL CONDYLE LEFT KNEE;  Surgeon: Carole Civil, MD;  Location[facility];  Service: Orthopedics;  Laterality: Left;   POLYPECTOMY  03/25/2022   Procedure: POLYPECTOMY;  Surgeon: Harvel Quale, MD;  [facility];  Service: Gastroenterology;;   SHOULDER ARTHROSCOPY WITH SUBACROMIAL DECOMPRESSION AND BICEP TENDON REPAIR Left 12/21/2017   Procedure: Left shoulder arthroscopic biceps  tenodesis, SAD, DCR and labrum debridement;  Surgeon: Yolonda Kida, MD;  Location: [facility];  Service: Orthopedics;  Laterality: Left;  120 mins   SHOULDER SURGERY     right x 2    TOTAL KNEE ARTHROPLASTY Left 06/06/2019   Procedure: TOTAL KNEE ARTHROPLASTY;  Surgeon: Durene Romans, MD;  Location: [facility];  Service: Orthopedics;  Laterality: Left;  70 mins   TOTAL KNEE ARTHROPLASTY Right 01/23/2021   Procedure: TOTAL KNEE ARTHROPLASTY;  Surgeon: Durene Romans, MD;  Location: [facility];  Service: Orthopedics;  Laterality: Right;  70 mins    Current Outpatient Medications  Medication Sig Dispense Refill   acetaminophen (TYLENOL) 500 MG tablet Take 2 tablets (1,000 mg total) by mouth every 8 (eight) hours. (Patient taking differently: Take 1,000 mg by mouth every 8 (eight) hours as needed for moderate pain.) 30 tablet 0   acetaZOLAMIDE ER (DIAMOX) 500 MG capsule Take 1 capsule by mouth 3 times  daily. (Patient taking differently: Take 500-1,000 mg by mouth See admin instructions. Take 500 mg in the morning and 1000 mg at night) 270 capsule 3   albuterol (PROVENTIL) (2.5 MG/3ML) 0.083% nebulizer solution Inhale the contents of 1 vial via nebulizer every 6 hours as needed 90 mL 1   albuterol (VENTOLIN HFA) 108 (90 Base) MCG/ACT inhaler Inhale 1-2 puffs by mouth every 4 hours as needed 6.7 g 2   amitriptyline (ELAVIL) 25 MG tablet TAKE 1 TABLET BY MOUTH AT BEDTIME 90 tablet 3   amLODipine (NORVASC) 5 MG tablet TAKE 1 TABLET BY MOUTH ONCE DAILY 90 tablet 2   Cyanocobalamin (VITAMIN B-12 IJ) Inject 1 Dose as directed every 30 (thirty) days.     cyclobenzaprine (FLEXERIL) 5 MG tablet Take 1 tablet by mouth every 8 hours as needed for muscle spasm 40 tablet 1   diclofenac Sodium (VOLTAREN) 1 % GEL Apply 1 Application topically 4 (four) times daily as needed (pain).     diphenhydrAMINE (BENADRYL) 25 MG tablet Take 25-50 mg by mouth every 6 (six) hours as needed for allergies.     EPINEPHrine (EPIPEN 2-PAK) 0.3 mg/0.3 mL IJ SOAJ injection Inject as directed for anaphylaxis. 2 each 5   famotidine (PEPCID) 40 MG tablet Take 1 tablet by mouth at bedtime. Needs office visit. 90 tablet 3   fluticasone (FLONASE) 50 MCG/ACT nasal spray Place 2 sprays into both nostrils daily. 16 g 0   Fremanezumab-vfrm (AJOVY) 225 MG/1.5ML SOAJ Inject 225 mg into the skin every 30 (thirty) days. 4.5 mL 3   gabapentin (NEURONTIN) 300 MG capsule Take 1 capsule (300 mg total) by mouth at bedtime. 90 capsule 5   guaiFENesin-codeine (ROBITUSSIN AC) 100-10 MG/5ML syrup Take 10 MLs by mouth  every 6 hours as needed 240 mL 0   hyoscyamine (LEVSIN SL) 0.125 MG SL tablet Dissolve 1 tablet under the tongue 3 times daily before meals. 90 tablet 1   loratadine (CLARITIN) 10 MG tablet Take 10 mg by mouth at bedtime.     losartan (COZAAR) 100 MG tablet Take 1 tablet (100 mg total) by mouth daily. 90 tablet 1   meclizine (ANTIVERT) 25  MG tablet Take 25 mg by mouth 3 (three) times daily as needed for dizziness.     montelukast (SINGULAIR) 10 MG tablet TAKE 1 TABLET BY MOUTH EVERY EVENING 90 tablet 2   omeprazole (PRILOSEC) 40 MG capsule Take 1 capsule (40 mg total) by mouth daily. 90 capsule 3   ondansetron (ZOFRAN-ODT) 4 MG disintegrating tablet  Dissolve 1 tablet by mouth every 8 hours as needed for nausea or vomiting. 90 tablet 1   polyethylene glycol (MIRALAX / GLYCOLAX) 17 g packet Take 17 g by mouth daily.     rizatriptan (MAXALT-MLT) 10 MG disintegrating tablet Dissolve 1 tablet (10 mg total) by mouth as needed for migraine. May repeat in 2 hours if needed 9 tablet 11   tamsulosin (FLOMAX) 0.4 MG CAPS capsule Take 1 capsule (0.4 mg total) by mouth daily. (Patient taking differently: Take 0.4 mg by mouth daily as needed (kidney stones).) 30 capsule 2   vedolizumab (ENTYVIO) 300 MG injection Inject 300 mg into the vein every 8 (eight) weeks.     No current facility-administered medications for this visit.    Allergies as of 12/24/2022 - Review Complete 12/24/2022  Allergen Reaction Noted   Bee pollen Anaphylaxis 05/28/2012   Hydromorphone hcl  12/02/2022   Iodinated contrast media Anaphylaxis, Hives, and Itching 12/02/2022   Other Shortness Of Breath 05/28/2012   Wasp venom Anaphylaxis and Shortness Of Breath 08/06/2011   Pollen extract Swelling 05/28/2012   Adhesive [tape] Rash 06/03/2012   Omnipaque [iohexol] Hives and Nausea Only 01/02/2011    Family History  Problem Relation Age of Onset   Hypertension Mother    Atrial fibrillation Mother    Arthritis Mother    Lupus Father    COPD Father    Rheum arthritis Father    Sarcoidosis Father    Lupus Sister    Congestive Heart Failure Maternal Grandmother    Migraines Neg Hx    Headache Neg Hx     Social History   Socioeconomic History   Marital status: Married    Spouse name: Not on file   Number of children: 4   Years of education: Not on file    Highest education level: Not on file  Occupational History    Employer:     Comment: corporate safety  Tobacco Use   Smoking status: Never    Passive exposure: Never   Smokeless tobacco: Never  Vaping Use   Vaping Use: Never used  Substance and Sexual Activity   Alcohol use: No    Alcohol/week: 0.0 standard drinks of alcohol   Drug use: Never   Sexual activity: Yes    Birth control/protection: Surgical  Other Topics Concern   Not on file  Social History Narrative   Lives with husband and oldest son    Caffeine use: about 3 cups of coffee/day   She works in Chief Operating Officer with [employer]   She has 4 children age 46-22.     Social Determinants of Health   Financial Resource Strain: Not on file  Food Insecurity: Not on file  Transportation Needs: Not on file  Physical Activity: Not on file  Stress: Not on file  Social Connections: Not on file    Review of systems General: negative for malaise, night sweats, fever, chills, weight loss Neck: Negative for lumps, goiter, pain and significant neck swelling Resp: Negative for cough, wheezing, dyspnea at rest CV: Negative for chest pain, leg swelling, palpitations, orthopnea GI: denies melena, hematochezia, nausea, vomiting, constipation, dysphagia, odyonophagia, early satiety or unintentional weight loss.  +loose stools +right sided abdominal pain  Urinary: R flank pain MSK: Negative for joint pain or swelling, back pain, and muscle pain. Derm: Negative for itching or rash Psych: Denies depression, anxiety, memory loss, confusion. No homicidal or suicidal ideation.  Heme: Negative for prolonged bleeding, bruising easily, and  swollen nodes. Endocrine: Negative for cold or heat intolerance, polyuria, polydipsia and goiter. Neuro: negative for tremor, gait imbalance, syncope and seizures. The remainder of the review of systems is noncontributory.  Physical Exam: BP 129/85 (BP Location: Left Arm, Patient Position:  Sitting, Cuff Size: Large)   Pulse 80   Temp (!) 97.3 F (36.3 C) (Temporal)   Ht 6' (1.829 m)   Wt 263 lb 12.8 oz (119.7 kg)   BMI 35.78 kg/m  General:   Alert and oriented. No distress noted. Pleasant and cooperative.  Head:  Normocephalic and atraumatic. Eyes:  Conjuctiva clear without scleral icterus. Mouth:  Oral mucosa pink and moist. Good dentition. No lesions. Heart: Normal rate and rhythm, s1 and s2 heart sounds present.  Lungs: Clear lung sounds in all lobes. Respirations equal and unlabored. Abdomen:  +BS, soft, and non-distended. Mild TTP of Right mid to lower abdomen. No rebound or guarding. No HSM or masses noted. Patient has R CVA tenderness on exam. Derm: No palmar erythema or jaundice Msk:  Symmetrical without gross deformities. Normal posture. Extremities:  Without edema. Neurologic:  Alert and  oriented x4 Psych:  Alert and cooperative. Normal mood and affect.  Invalid input(s): "6 MONTHS"   ASSESSMENT: [name] is a 58 y.o. female presenting today for Right sided abdominal pain and R flank pain.   Right abdominal and Right flank pain since last week with some frank hematuria previously. She denies urinary symptoms currently. Having looser/thinner stools which is baseline for her, she reports 3-4 BMs per day which she feels is more than baseline, though she was having 3-4 BMs per day when seen in December as well. No rectal bleeding, melena, fevers or chills. Given CVA tenderness on exam and previous hematuria, and her history of renal calculi, there is certainly concern for kidney stones/urinary etiology, however, cannot ultimately rule out diverticulitis (thought right sided diverticulitis would be rare, recent TCS with pancolonic diverticulosis) or a Crohn's flare, however her symptoms are not necessarily consistent with either of these. Will check UA to rule out urinary etiology. If UA Is negative, will obtain CT A/P with contrast for further evaluation/rule  out diverticulitis. Patient is aware if she develops severe pain, rectal bleeding, vomiting, fevers, she needs to proceed to the ED for more urgent evaluation.    PLAN:  Check urinalysis  2. CT A/P with contrast if UA negative  3. ER precautions  All questions were answered, patient verbalized understanding and is in agreement with plan as outlined above.   Rhys Anchondo L. Alver Sorrow, MSN, APRN, AGNP-C Adult-Gerontology Nurse Practitioner [facility]  I have reviewed the note and agree with the APP's assessment as described in this progress note  I reviewed the  CT images today  with Dr. Lavonia Dana.  There was presence of infiltration at the distal terminal ileum but no presence of active inflammation.  There was no significant thickening of the small bowel in the there was some mild dilation of the portion of the small bowel loops.  These findings are not consistent with active inflammation but could have represented previous inflammation prior to starting her current biological therapy.  Most likely her symptoms are related to overlapping IBS.  Will continue her on Entyvio every 8 weeks for now.  [name], MD Gastroenterology and Hepatology [facility]

## 2022-12-25 LAB — URINALYSIS
Bilirubin Urine: NEGATIVE
Glucose, UA: NEGATIVE
Hgb urine dipstick: NEGATIVE
Ketones, ur: NEGATIVE
Leukocytes,Ua: NEGATIVE
Nitrite: NEGATIVE
Protein, ur: NEGATIVE
Specific Gravity, Urine: 1.018 (ref 1.001–1.035)
pH: 6 (ref 5.0–8.0)

## 2022-12-27 DIAGNOSIS — R109 Unspecified abdominal pain: Secondary | ICD-10-CM | POA: Insufficient documentation

## 2022-12-27 DIAGNOSIS — R319 Hematuria, unspecified: Secondary | ICD-10-CM | POA: Insufficient documentation

## 2022-12-28 ENCOUNTER — Ambulatory Visit (HOSPITAL_BASED_OUTPATIENT_CLINIC_OR_DEPARTMENT_OTHER): Payer: Commercial Managed Care - PPO

## 2022-12-28 ENCOUNTER — Other Ambulatory Visit (INDEPENDENT_AMBULATORY_CARE_PROVIDER_SITE_OTHER): Payer: Self-pay | Admitting: *Deleted

## 2022-12-28 ENCOUNTER — Ambulatory Visit (HOSPITAL_COMMUNITY)
Admission: RE | Admit: 2022-12-28 | Discharge: 2022-12-28 | Disposition: A | Discharge status: Home / Self Care | Payer: Commercial Managed Care - PPO | Source: Ambulatory Visit | Attending: Gastroenterology | Admitting: Gastroenterology

## 2022-12-28 DIAGNOSIS — I340 Nonrheumatic mitral (valve) insufficiency: Secondary | ICD-10-CM

## 2022-12-28 DIAGNOSIS — I517 Cardiomegaly: Secondary | ICD-10-CM

## 2022-12-28 DIAGNOSIS — K50019 Crohn's disease of small intestine with unspecified complications: Secondary | ICD-10-CM | POA: Insufficient documentation

## 2022-12-28 DIAGNOSIS — R1031 Right lower quadrant pain: Secondary | ICD-10-CM

## 2022-12-28 DIAGNOSIS — R109 Unspecified abdominal pain: Secondary | ICD-10-CM | POA: Diagnosis not present

## 2022-12-28 DIAGNOSIS — R072 Precordial pain: Secondary | ICD-10-CM | POA: Diagnosis not present

## 2022-12-28 LAB — ECHOCARDIOGRAM COMPLETE
Area-P 1/2: 3.29 cm2
MV M vel: 5.14 m/s
MV Peak grad: 105.5 mmHg
Radius: 0.2 cm
S' Lateral: 2.7 cm

## 2022-12-28 NOTE — Addendum Note (Signed)
Addended by: Cheron Every on: 12/28/2022 03:37 PM   Modules accepted: Orders

## 2022-12-30 ENCOUNTER — Other Ambulatory Visit (HOSPITAL_COMMUNITY): Payer: Self-pay

## 2022-12-31 ENCOUNTER — Other Ambulatory Visit (HOSPITAL_COMMUNITY): Payer: Self-pay

## 2022-12-31 ENCOUNTER — Telehealth (INDEPENDENT_AMBULATORY_CARE_PROVIDER_SITE_OTHER): Payer: Self-pay | Admitting: *Deleted

## 2022-12-31 NOTE — Telephone Encounter (Signed)
Patient called and wanted to know if you have had a chance to talk with Dr. Jenetta Downer. She also wanted to let you know that her intestines felt like they were fluttering or crawling yesterday.   [phone]

## 2023-01-04 ENCOUNTER — Encounter (INDEPENDENT_AMBULATORY_CARE_PROVIDER_SITE_OTHER): Payer: Self-pay

## 2023-01-04 ENCOUNTER — Telehealth (INDEPENDENT_AMBULATORY_CARE_PROVIDER_SITE_OTHER): Payer: Self-pay | Admitting: *Deleted

## 2023-01-04 NOTE — Telephone Encounter (Signed)
Patient called to get results of CT results [id number].

## 2023-01-21 DIAGNOSIS — K5000 Crohn's disease of small intestine without complications: Secondary | ICD-10-CM | POA: Diagnosis not present

## 2023-01-22 ENCOUNTER — Other Ambulatory Visit (HOSPITAL_COMMUNITY): Payer: Self-pay

## 2023-01-22 ENCOUNTER — Other Ambulatory Visit (INDEPENDENT_AMBULATORY_CARE_PROVIDER_SITE_OTHER): Payer: Self-pay | Admitting: Internal Medicine

## 2023-01-22 MED ORDER — CYCLOBENZAPRINE HCL 5 MG PO TABS
5.0000 mg | ORAL_TABLET | Freq: Three times a day (TID) | ORAL | 1 refills | Status: DC
  Filled 2023-01-22: qty 40, 14d supply, fill #0
  Filled 2023-04-19: qty 40, 14d supply, fill #1

## 2023-01-22 MED ORDER — LOSARTAN POTASSIUM 100 MG PO TABS
100.0000 mg | ORAL_TABLET | Freq: Every day | ORAL | 1 refills | Status: DC
  Filled 2023-01-22: qty 90, 90d supply, fill #0
  Filled 2023-06-07: qty 90, 90d supply, fill #1

## 2023-01-25 ENCOUNTER — Other Ambulatory Visit (HOSPITAL_COMMUNITY): Payer: Self-pay

## 2023-01-25 MED ORDER — FAMOTIDINE 40 MG PO TABS
ORAL_TABLET | ORAL | 3 refills | Status: DC
  Filled 2023-01-25: qty 90, 90d supply, fill #0
  Filled 2023-06-07: qty 90, 90d supply, fill #1
  Filled 2023-08-23: qty 90, 90d supply, fill #2
  Filled 2023-12-16: qty 90, 90d supply, fill #3

## 2023-01-26 ENCOUNTER — Other Ambulatory Visit (HOSPITAL_COMMUNITY): Payer: Self-pay

## 2023-01-26 MED ORDER — MONTELUKAST SODIUM 10 MG PO TABS
10.0000 mg | ORAL_TABLET | Freq: Every day | ORAL | 3 refills | Status: DC
  Filled 2023-01-26: qty 90, 90d supply, fill #0

## 2023-01-27 ENCOUNTER — Other Ambulatory Visit (HOSPITAL_COMMUNITY): Payer: Self-pay

## 2023-01-28 ENCOUNTER — Other Ambulatory Visit (HOSPITAL_COMMUNITY): Payer: Self-pay

## 2023-01-28 ENCOUNTER — Other Ambulatory Visit: Payer: Self-pay

## 2023-02-10 DIAGNOSIS — R310 Gross hematuria: Secondary | ICD-10-CM | POA: Diagnosis not present

## 2023-02-18 ENCOUNTER — Other Ambulatory Visit (HOSPITAL_COMMUNITY): Payer: Self-pay

## 2023-02-18 ENCOUNTER — Other Ambulatory Visit: Payer: Self-pay

## 2023-02-19 ENCOUNTER — Other Ambulatory Visit (HOSPITAL_COMMUNITY): Payer: Self-pay

## 2023-02-19 MED ORDER — DIPHENHYDRAMINE HCL 25 MG PO TABS
ORAL_TABLET | ORAL | 0 refills | Status: DC
  Filled 2023-02-19: qty 2, 1d supply, fill #0

## 2023-02-19 MED ORDER — PREDNISONE 50 MG PO TABS
ORAL_TABLET | ORAL | 0 refills | Status: DC
  Filled 2023-02-19: qty 3, 3d supply, fill #0

## 2023-02-24 ENCOUNTER — Other Ambulatory Visit (HOSPITAL_COMMUNITY): Payer: Self-pay

## 2023-03-03 ENCOUNTER — Encounter (HOSPITAL_COMMUNITY): Payer: Self-pay

## 2023-03-03 ENCOUNTER — Other Ambulatory Visit (HOSPITAL_COMMUNITY): Payer: Self-pay

## 2023-03-04 ENCOUNTER — Other Ambulatory Visit (HOSPITAL_COMMUNITY): Payer: Self-pay

## 2023-03-12 DIAGNOSIS — D511 Vitamin B12 deficiency anemia due to selective vitamin B12 malabsorption with proteinuria: Secondary | ICD-10-CM | POA: Diagnosis not present

## 2023-03-18 ENCOUNTER — Other Ambulatory Visit (HOSPITAL_COMMUNITY): Payer: Self-pay | Admitting: Urology

## 2023-03-18 DIAGNOSIS — R310 Gross hematuria: Secondary | ICD-10-CM

## 2023-03-18 DIAGNOSIS — K5000 Crohn's disease of small intestine without complications: Secondary | ICD-10-CM | POA: Diagnosis not present

## 2023-03-19 ENCOUNTER — Ambulatory Visit (HOSPITAL_COMMUNITY)
Admission: RE | Admit: 2023-03-19 | Discharge: 2023-03-19 | Disposition: A | Discharge status: Home / Self Care | Payer: Commercial Managed Care - PPO | Source: Ambulatory Visit | Attending: Urology | Admitting: Urology

## 2023-03-19 DIAGNOSIS — K5730 Diverticulosis of large intestine without perforation or abscess without bleeding: Secondary | ICD-10-CM | POA: Diagnosis not present

## 2023-03-19 DIAGNOSIS — N200 Calculus of kidney: Secondary | ICD-10-CM | POA: Diagnosis not present

## 2023-03-19 DIAGNOSIS — R310 Gross hematuria: Secondary | ICD-10-CM | POA: Diagnosis not present

## 2023-03-19 LAB — POCT I-STAT CREATININE: Creatinine, Ser: 1 mg/dL (ref 0.44–1.00)

## 2023-03-19 MED ORDER — IOHEXOL 300 MG/ML  SOLN
125.0000 mL | Freq: Once | INTRAMUSCULAR | Status: AC | PRN
  Administered 2023-03-19: 125 mL via INTRAVENOUS

## 2023-03-22 NOTE — Progress Notes (Unsigned)
No chief complaint on file.  HISTORY OF PRESENT ILLNESS:  03/24/2023 ALL: [name] returns for follow up for migraines. She was last seen 09/2022. We switched from Nurtec to Ajovy and continued acetazolamide, amitriptyline and rizatriptan. Since,   09/15/2022 ALL: [name] returns for follow up for migraines and IIH. She was last seen 03/2022. We switched from Emgality back to Ajovy. Ajovy was not approved and we switched to Nurtec QOD. She continued amitriptyline 25mg  QHS, acetazolamide 500 TID and rizatriptan PRN. She reports no significant change in headaches. She reports near daily headaches. She has to take rizatriptan 1-2 times a week. Eye exam was unremarkable about 2 months ago.   03/11/2022 ALL: [name] returns for follow up for migraines and IIH. She was last seen 03/2021 and doing well on Emgality, amitriptyline 25mg  QHS, acetazolamide 500 BID and rizatriptan PRN. She called 06/2021 with worsening tension headaches and advised to increase acetazolamide to 500/1000. Since, she feels that she was doing a little better for a couple of months but recently having more headaches. She continues to have near daily headaches. She has about 5-10 migraines per month. Right eye is sensitive to light. She continues to feel pressure in right eye and has some pressure. Last eye exam was about this time last year.  Rizatriptan does work for abortive therapy. She also uses complementary therapy. BP has been better. She was recently started Entyvio injections for Chron's. She has gained some weight.   03/11/2021 ALL:  [name] returns for follow up for migraines and IIH. She was taking Ajovy, amitriptyline, acetazolamide and rizatriptan. Ajovy was no longer covered on insurance and she was switched to [employer] She has had 1 injection. She is tolerating acetazolamide. Amitriptyline helps with sleep. She feels that headaches are well managed. She does have some sort of headache almost every day. She takes rizatriptan  maybe once a month. Some months she doesn't have to take it. She had right TKR 01/23/2021. She took oxycodone post surgery but has not taken regularly. She has tried to wean gabapentin. She is having more low back pain and "nerve pain" of right leg. She called ortho for a refill of gabapentin.   10/14/2020 AA: Interval history October 13, 2020(last seen by myself 9/17 for appointment and [id number]/72021 for emg/ncs and also seen by NP Deryn Massengale 07/29/2020: Patient is here for a follow-up of migraines and other multiple neurologic symptoms.  At last appointment she was started on Ajovy and amitriptyline for her migraines, Diamox after LP with elevated opening pressure, rizatriptan for acute migraine management, and an EMG nerve conduction study was performed which did not show anything significant to explain all her other multiple neurologic symptoms(See below) in the setting of mood disorder which may be contributory.  Per Chapman Matteucci the rizatriptan was helping, amitriptyline was helping her sleep.  She also endorsed some decreased intensity of headaches and migraines to starting Ajovy, amitriptyline and Diamox.  Kareema Keitt continued current treatment.  Since last being seen she was seen by ophthalmology.  Neuro exam is nonfocal.   Her headaches and migraines are feeling better. She still has the pressure in the head but improved. Increase Diamox, keep her on the Ajovy.   07/29/2020 ALL:  [name] is a 57 y.o. female here today for follow up for headaches. She was started on Ajovy and amitriptyline for migraine prevention as well as rizatriptan for abortive therapy in 06/2020. MRI showed concerns for partially empty sella and she was started  on Diamox 250mg  BID. She had a LP on 07/17/2020 and started on Diamox 07/18/2020. She called 07/22/2020 reporting symptoms of increased pressure and pulsating of head with bilateral lower back pain (not at site of LP). Symptoms lasted for about 2-3 hours. She felt that she  was having a hard time walking. She states that head pressure comes and goes. She has taken rizatriptan three times and it does abort migraine. She feels that she has brain fog at times. She has noted some tingling of feet. She is sleeping better after starting amitriptyline.   Low back continues with any prolonged standing. She has numbness and tingling of her lower extremities, worse on left. She reports her feet feel like they are going to sleep. NCS/EMG showed possible remote lumbosacral radiculopathy. She is was previously seeing Dr Corliss Skains for similar symptoms. Referral was placed back to Dr Corliss Skains.    HISTORY (copied from Dr Trevor Mace note on 06/21/2020)  HPI:  [name] is a 57 y.o. female here as requested by Assunta Found, MD for headaches.  Past medical history obesity, headache, vitamin D deficiency.  I reviewed Dr. Lamar Blinks notes which provided no significant history of patient's chief complaint.  She is here today alone, she has been having headaches, started sometime in May, she had a physical at the end of the May and discussed headaches have been going away, also tired no energy, found out she was B12 deficiency and started shots per month.  Headaches are "minute", can also light sensitivity and nausea, she has some GI issues, Crohn's, has been on steroids.  She also reports nerve pain and stiffness in her hands and feet, she stopped using CPAP due to losing weight and long longer having any problems. She has been having headaches she has pressure in the back of her head at the occipital area and down to her neck, she has tried massage. They start in over the eyes as well. When they are really intense, she is sensitive to light, she has pressure in the back, nausea, pulsating/pulsing/throbbing. She is having the migraines at least 4x a week, they never really go away they just minimize, she will wake up with headaches, she had lost about 40 pounds and she has OSA in the past. She  has gained 25 pounds since March, she doesn't sleep, ongoing for about a year after she found her brother dead in 09-13-2023. She is fatigued, she has sleep issues. She staets she doesn't sleep all night and go without any sleep for 3 day - she says literally no sleep she turns a lot.  She has vision changes, headache are positional worse supinein the morning, morning headaches. She has muscle cramps, lots of stress. No other focal neurologic deficits, associated symptoms, inciting events or modifiable factors.   Reviewed notes, labs and imaging from outside physicians, which showed:   MRI brain 2017: personally reviewed images and agree with the following:  Brain: No diffusion signal abnormality. Stable partially empty sella turcica. No significant T2 FLAIR signal abnormality. No abnormal enhancement. No abnormal susceptibility hypointensity to indicate intracranial hemorrhage. No focal mass effect. No extra-axial collection. Normal ventricle size.   MRA H/N 09/2016 reviewed reports/findings:   1. No acute intracranial abnormality. Normal MRI of the brain for age. 2. Patent circle of Willis. No large vessel occlusion, aneurysm, or significant stenosis is identified. 3. Patent carotid and vertebral arteries of the neck. No dissection, aneurysm, or significant stenosis is identified.   MRI cervical spine  01/08/2012:  personally reviewed images and agree with the following: 1. Tiny left paracentral disc protrusion at C5-6 without significant  canal or neural foraminal stenosis. Finding is stable relative to  previous MRI from 05/28/2007.  2. Mild degenerative disc disease at C4-5 without canal or foraminal  stenosis.  3. Otherwise unremarkable MRI of the cervical spine.    CBC/CMP: normal 04/12/2020   Amlodipine, tylenol, aspirin, flexeril, gabapentin, ibuprofen, toradol injection, magnesium, losartan, meclizine, mobic, reglan, zofran, tramadol, topiramate   REVIEW OF SYSTEMS: Out of a  complete 14 system review of symptoms, the patient complains only of the following symptoms, headaches, numbness and tingling of extremities, brain fog, mass of right posterior thigh and all other reviewed systems are negative.   ALLERGIES: Allergies  Allergen Reactions   Bee Pollen Anaphylaxis   Hydromorphone Hcl     Other Reaction(s): headache   Iodinated Contrast Media Anaphylaxis, Hives and Itching   Other Shortness Of Breath    Lemon grass   Wasp Venom Anaphylaxis and Shortness Of Breath   Pollen Extract Swelling   Adhesive [Tape] Rash   Omnipaque [Iohexol] Hives and Nausea Only    Pt. Was premedicated with emergent protocol.     HOME MEDICATIONS: Outpatient Medications Prior to Visit  Medication Sig Dispense Refill   acetaminophen (TYLENOL) 500 MG tablet Take 2 tablets (1,000 mg total) by mouth every 8 (eight) hours. (Patient taking differently: Take 1,000 mg by mouth every 8 (eight) hours as needed for moderate pain.) 30 tablet 0   acetaZOLAMIDE ER (DIAMOX) 500 MG capsule Take 1 capsule by mouth 3 times daily. (Patient taking differently: Take 500-1,000 mg by mouth See admin instructions. Take 500 mg in the morning and 1000 mg at night) 270 capsule 3   albuterol (PROVENTIL) (2.5 MG/3ML) 0.083% nebulizer solution Inhale the contents of 1 vial via nebulizer every 6 hours as needed 90 mL 1   albuterol (VENTOLIN HFA) 108 (90 Base) MCG/ACT inhaler Inhale 1-2 puffs by mouth every 4 hours as needed 6.7 g 2   amitriptyline (ELAVIL) 25 MG tablet TAKE 1 TABLET BY MOUTH AT BEDTIME 90 tablet 3   amLODipine (NORVASC) 5 MG tablet TAKE 1 TABLET BY MOUTH ONCE DAILY 90 tablet 2   Cyanocobalamin (VITAMIN B-12 IJ) Inject 1 Dose as directed every 30 (thirty) days.     cyclobenzaprine (FLEXERIL) 5 MG tablet Take 1 tablet (5 mg total) by mouth every 8 (eight) hoursas needed for muscle spasms 40 tablet 1   diclofenac Sodium (VOLTAREN) 1 % GEL Apply 1 Application topically 4 (four) times daily as  needed (pain).     diphenhydrAMINE (BENADRYL) 25 MG tablet Take 25-50 mg by mouth every 6 (six) hours as needed for allergies.     diphenhydrAMINE (BENADRYL) 25 MG tablet Take 2 tablets by mouth 1 hour prior to CT scan 2 tablet 0   EPINEPHrine (EPIPEN 2-PAK) 0.3 mg/0.3 mL IJ SOAJ injection Inject as directed for anaphylaxis. 2 each 5   famotidine (PEPCID) 40 MG tablet Take 1 tablet by mouth at bedtime. Needs office visit. 90 tablet 3   fluticasone (FLONASE) 50 MCG/ACT nasal spray Place 2 sprays into both nostrils daily. 16 g 0   Fremanezumab-vfrm (AJOVY) 225 MG/1.5ML SOAJ Inject 225 mg into the skin every 30 (thirty) days. 4.5 mL 3   gabapentin (NEURONTIN) 300 MG capsule Take 1 capsule (300 mg total) by mouth at bedtime. 90 capsule 5   guaiFENesin-codeine (ROBITUSSIN AC) 100-10 MG/5ML syrup Take 10 MLs by  mouth  every 6 hours as needed 240 mL 0   hyoscyamine (LEVSIN SL) 0.125 MG SL tablet Dissolve 1 tablet under the tongue 3 times daily before meals. 90 tablet 1   loratadine (CLARITIN) 10 MG tablet Take 10 mg by mouth at bedtime.     losartan (COZAAR) 100 MG tablet Take 1 tablet (100 mg total) by mouth daily. 90 tablet 1   meclizine (ANTIVERT) 25 MG tablet Take 25 mg by mouth 3 (three) times daily as needed for dizziness.     montelukast (SINGULAIR) 10 MG tablet TAKE 1 TABLET BY MOUTH EVERY EVENING 90 tablet 2   montelukast (SINGULAIR) 10 MG tablet Take 1 tablet (10 mg) by mouth daily. 90 tablet 3   omeprazole (PRILOSEC) 40 MG capsule Take 1 capsule (40 mg total) by mouth daily. 90 capsule 3   ondansetron (ZOFRAN-ODT) 4 MG disintegrating tablet Dissolve 1 tablet by mouth every 8 hours as needed for nausea or vomiting. 90 tablet 1   polyethylene glycol (MIRALAX / GLYCOLAX) 17 g packet Take 17 g by mouth daily.     predniSONE (DELTASONE) 50 MG tablet Take 1 tablet by mouth at 13 hr, 7 hr, and 1 hr prior to CT scan 3 tablet 0   rizatriptan (MAXALT-MLT) 10 MG disintegrating tablet Dissolve 1 tablet  (10 mg total) by mouth as needed for migraine. May repeat in 2 hours if needed 9 tablet 11   tamsulosin (FLOMAX) 0.4 MG CAPS capsule Take 1 capsule (0.4 mg total) by mouth daily. (Patient taking differently: Take 0.4 mg by mouth daily as needed (kidney stones).) 30 capsule 2   vedolizumab (ENTYVIO) 300 MG injection Inject 300 mg into the vein every 8 (eight) weeks.     No facility-administered medications prior to visit.     PAST MEDICAL HISTORY: Past Medical History:  Diagnosis Date   Anxiety    Arthritis    Asthma    Cervical hyperplasia    hisotry of   Chest pain    cath 2005 norm cors, repeat cath Feb 2011 normal cors and only minimally  elevated pulmonary pressures   Crohn disease (HCC)    Diverticulitis of colon 07/17/2014   Diverticulosis    Dyspnea    GERD (gastroesophageal reflux disease)    Hiatal hernia    History of iron deficiency anemia    Hypertension    Idiopathic intracranial hypertension    Per patient   Migraines    Obesity    Palpitations    Pneumonia 2008   14 days hospitalization, bilateral   PONV (postoperative nausea and vomiting)    Sleep apnea sleep study 11/03/2009    cpap; does sometimes, doesnt use every night. weight loss no longer using CPAP   Vitamin B deficiency      PAST SURGICAL HISTORY: Past Surgical History:  Procedure Laterality Date   ABDOMINAL HYSTERECTOMY     ANKLE ARTHROSCOPY  06/01/2012   Procedure: ANKLE ARTHROSCOPY;  Surgeon: Sherri Rad, MD;  Location:  [facility];  Service: Orthopedics;  Laterality: Left;  left ankle arthorsocpy with extensive debridement and gastroc slide   ANKLE SURGERY Left 05/11/2018   BIOPSY  04/26/2019   Procedure: BIOPSY;  Surgeon: Malissa Hippo, MD;  [facility];  Service: Endoscopy;;  ileum colon   BIOPSY  03/25/2022   Procedure: BIOPSY;  Surgeon: Dolores Frame, MD;  [facility]: [facility];  Service: Gastroenterology;;   CARDIAC CATHETERIZATION   11/22/2009 and 2005  WNL   CESAREAN SECTION     CHOLECYSTECTOMY  09/08/2006   lap. chole.   CHONDROPLASTY  06/17/2012   Procedure: CHONDROPLASTY;  Surgeon: Vickki Hearing, MD;  Location: [facility];  Service: Orthopedics;  Laterality: Right;  right patella   COLONOSCOPY N/A 04/26/2019   Rehman: a few erosions in ternimal ileum, two erosions in cecum (assocaited with acute inflammation) diverticulosis in sigmoid hepatic flexure and ascending colon, external hemorrhoids   COLONOSCOPY WITH PROPOFOL N/A 03/25/2022   Procedure: COLONOSCOPY WITH PROPOFOL;  Surgeon: Dolores Frame, MD;  [facility];  Service: Gastroenterology;  Laterality: N/A;  830 ASA 1   ESOPHAGOGASTRODUODENOSCOPY N/A 05/14/2015   Procedure: ESOPHAGOGASTRODUODENOSCOPY (EGD);  Surgeon: Malissa Hippo, MD;  [facility];  Service: Endoscopy;  Laterality: N/A;  730   ESOPHAGOGASTRODUODENOSCOPY (EGD) WITH PROPOFOL N/A 03/25/2022   Procedure: ESOPHAGOGASTRODUODENOSCOPY (EGD) WITH PROPOFOL;  Surgeon: Dolores Frame, MD;  [facility];  Service: Gastroenterology;  Laterality: N/A;   GIVENS CAPSULE STUDY N/A 05/24/2019   Procedure: GIVENS CAPSULE STUDY;  Surgeon: Malissa Hippo, MD;  [facility];  Service: Endoscopy;  Laterality: N/A;  7:30   INGUINAL HERNIA REPAIR  10/30/2008   right   KNEE ARTHROSCOPY Right 2013   KNEE ARTHROSCOPY WITH LATERAL MENISECTOMY Left 12/23/2016   Procedure: LEFT KNEE ARTHROSCOPY WITH LATERAL MENISECTOMY;  Surgeon: Vickki Hearing, MD;  Location: [facility];  Service: Orthopedics;  Laterality: Left;   KNEE ARTHROSCOPY WITH MEDIAL MENISECTOMY Left 12/23/2016   Procedure: LEFT KNEE ARTHROSCOPY WITH MEDIAL MENISECTOMY CHONDROPLASTY PATELLA  AND MEDIAL FEMORAL CONDYLE LEFT KNEE;  Surgeon: Vickki Hearing, MD;  Location: [facility];  Service: Orthopedics;  Laterality: Left;   POLYPECTOMY  03/25/2022   Procedure: POLYPECTOMY;  Surgeon: Dolores Frame, MD;  [facility];  Service: Gastroenterology;;   SHOULDER ARTHROSCOPY WITH SUBACROMIAL DECOMPRESSION AND BICEP TENDON REPAIR Left 12/21/2017   Procedure: Left shoulder arthroscopic biceps tenodesis, SAD, DCR and labrum debridement;  Surgeon: Yolonda Kida, MD;  Location: [facility];  Service: Orthopedics;  Laterality: Left;  120 mins   SHOULDER SURGERY     right x 2    TOTAL KNEE ARTHROPLASTY Left 06/06/2019   Procedure: TOTAL KNEE ARTHROPLASTY;  Surgeon: Durene Romans, MD;  Location: [facility];  Service: Orthopedics;  Laterality: Left;  70 mins   TOTAL KNEE ARTHROPLASTY Right 01/23/2021   Procedure: TOTAL KNEE ARTHROPLASTY;  Surgeon: Durene Romans, MD;  Location: [facility];  Service: Orthopedics;  Laterality: Right;  70 mins     FAMILY HISTORY: Family History  Problem Relation Age of Onset   Hypertension Mother    Atrial fibrillation Mother    Arthritis Mother    Lupus Father    COPD Father    Rheum arthritis Father    Sarcoidosis Father    Lupus Sister    Congestive Heart Failure Maternal Grandmother    Migraines Neg Hx    Headache Neg Hx      SOCIAL HISTORY: Social History   Socioeconomic History   Marital status: Married    Spouse name: Not on file   Number of children: 4   Years of education: Not on file   Highest education level: Not on file  Occupational History    Employer: [employer]    Comment: corporate safety  Tobacco Use   Smoking status: Never    Passive exposure: Never   Smokeless tobacco: Never  Vaping Use  Vaping Use: Never used  Substance and Sexual Activity   Alcohol use: No    Alcohol/week: 0.0 standard drinks of alcohol   Drug use: Never   Sexual activity: Yes    Birth control/protection: Surgical  Other Topics Concern   Not on file  Social History Narrative   Lives with husband and oldest son    Caffeine use: about 3 cups of coffee/day   She works in Geophysicist/field seismologist with [employer]   She has 4 children  age 30-22.     Social Determinants of Health   Financial Resource Strain: Not on file  Food Insecurity: Not on file  Transportation Needs: Not on file  Physical Activity: Not on file  Stress: Not on file  Social Connections: Not on file  Intimate Partner Violence: Not on file      PHYSICAL EXAM  There were no vitals filed for this visit.    There is no height or weight on file to calculate BMI.   Generalized: Well developed, in no acute distress   Neurological examination  Mentation: Alert oriented to time, place, history taking. Follows all commands speech and language fluent Cranial nerve II-XII: Pupils were equal round reactive to light. Extraocular movements were full, visual field were full on confrontational test. Facial sensation and strength were normal. Uvula tongue midline. Head turning and shoulder shrug  were normal and symmetric. Motor: The motor testing reveals 5 over 5 strength of all 4 extremities. Good symmetric motor tone is noted throughout.  Sensory: Sensory testing is intact to soft touch on all 4 extremities. No evidence of extinction is noted.  Gait and station: Gait is arthritic, stable without assistive device   DIAGNOSTIC DATA (LABS, IMAGING, TESTING) - I reviewed patient records, labs, notes, testing and imaging myself where available.  Lab Results  Component Value Date   WBC 3.8 (L) 10/16/2022   HGB 12.9 10/16/2022   HCT 40.6 10/16/2022   MCV 90.4 10/16/2022   PLT 349 10/16/2022      Component Value Date/Time   NA 139 10/16/2022 1246   NA 144 04/01/2022 1626   K 3.4 (L) 10/16/2022 1246   CL 110 10/16/2022 1246   CO2 21 (L) 10/16/2022 1246   GLUCOSE 103 (H) 10/16/2022 1246   BUN 15 10/16/2022 1246   BUN 14 04/01/2022 1626   CREATININE 1.00 03/19/2023 1609   CREATININE 0.91 09/24/2022 0955   CALCIUM 8.9 10/16/2022 1246   PROT 6.9 09/24/2022 0955   PROT 7.2 04/01/2022 1626   ALBUMIN 4.6 04/01/2022 1626   AST 13 09/24/2022 0955    ALT 15 09/24/2022 0955   ALKPHOS 66 04/01/2022 1626   BILITOT 0.4 09/24/2022 0955   BILITOT 0.4 04/01/2022 1626   GFRNONAA >60 10/16/2022 1246   GFRNONAA 87 08/01/2019 1530   GFRAA >60 04/12/2020 1232   GFRAA 100 08/01/2019 1530   Lab Results  Component Value Date   CHOL 165 04/26/2016   HDL 41 04/26/2016   LDLCALC 88 04/26/2016   TRIG 181 (H) 04/26/2016   CHOLHDL 4.0 04/26/2016   Lab Results  Component Value Date   HGBA1C 5.8 (H) 04/26/2016   Lab Results  Component Value Date   VITAMINB12 345 07/31/2021   Lab Results  Component Value Date   TSH 0.880 07/11/2020      ASSESSMENT AND PLAN  57 y.o. year old female  has a past medical history of Anxiety, Arthritis, Asthma, Cervical hyperplasia, Chest pain, Crohn disease (HCC), Diverticulitis of  colon (07/17/2014), Diverticulosis, Dyspnea, GERD (gastroesophageal reflux disease), Hiatal hernia, History of iron deficiency anemia, Hypertension, Idiopathic intracranial hypertension, Migraines, Obesity, Palpitations, Pneumonia (2008), PONV (postoperative nausea and vomiting), Sleep apnea (sleep study 11/03/2009), and Vitamin B deficiency. here with   No diagnosis found.  Setareh repots that headaches are unchanged over the past few months. Although she continues to have a mild tension headache most days, she does not feel these are bothersome. She does feel that migraines were better managed on Ajovy. I will discontinue Nurtec and restart Ajovy. Failed Emgality. Amovig and Qulipta contraindicated due to chronic constipation. We will continue acetazolamide 500 TID, amitriptyline 25mg  QHS and rizatriptan PRN. Consider Botox in future. Healthy lifestyle habits encouraged. Follow-up recommended in 6 months, sooner if needed. She verbalizes understanding and agreement with this plan.   Shawnie Dapper, MSN, FNP-C 03/22/2023, 12:35 PM  [facility] [address], [address] [place], [place] [address] [phone]

## 2023-03-22 NOTE — Patient Instructions (Signed)
Below is our plan:  We will continue Ajovy monthly, acetazolimide 500mg  in am and 100mg  in evenings, amitriptyline 25mg  at bedtime, and rizatriptan as needed. We will consider Botox in future if needed. Consider having another sleep evaluation.   Please make sure you are staying well hydrated. I recommend 50-60 ounces daily. Well balanced diet and regular exercise encouraged. Consistent sleep schedule with 6-8 hours recommended.   Please continue follow up with care team as directed.   Follow up with me in 1 year   You may receive a survey regarding today's visit. I encourage you to leave honest feed back as I do use this information to improve patient care. Thank you for seeing me today!   GENERAL HEADACHE INFORMATION:   Natural supplements: Magnesium Oxide or Magnesium Glycinate 500 mg at bed (up to 800 mg daily) Coenzyme Q10 300 mg in AM Vitamin B2- 200 mg twice a day   Add 1 supplement at a time since even natural supplements can have undesirable side effects. You can sometimes buy supplements cheaper (especially Coenzyme Q10) at www.WebmailGuide.co.za or at [facility].  Migraine with aura: There is increased risk for stroke in women with migraine with aura and a contraindication for the combined contraceptive pill for use by women who have migraine with aura. The risk for women with migraine without aura is lower. However other risk factors like smoking are far more likely to increase stroke risk than migraine. There is a recommendation for no smoking and for the use of OCPs without estrogen such as progestogen only pills particularly for women with migraine with aura.Marland Kitchen People who have migraine headaches with auras may be 3 times more likely to have a stroke caused by a blood clot, compared to migraine patients who don't see auras. Women who take hormone-replacement therapy may be 30 percent more likely to suffer a clot-based stroke than women not taking medication containing estrogen. Other risk  factors like smoking and high blood pressure may be  much more important.    Vitamins and herbs that show potential:   Magnesium: Magnesium (250 mg twice a day or 500 mg at bed) has a relaxant effect on smooth muscles such as blood vessels. Individuals suffering from frequent or daily headache usually have low magnesium levels which can be increase with daily supplementation of 400-750 mg. Three trials found 40-90% average headache reduction  when used as a preventative. Magnesium may help with headaches are aura, the best evidence for magnesium is for migraine with aura is its thought to stop the cortical spreading depression we believe is the pathophysiology of migraine aura.Magnesium also demonstrated the benefit in menstrually related migraine.  Magnesium is part of the messenger system in the serotonin cascade and it is a good muscle relaxant.  It is also useful for constipation which can be a side effect of other medications used to treat migraine. Good sources include nuts, whole grains, and tomatoes. Side Effects: loose stool/diarrhea  Riboflavin (vitamin B 2) 200 mg twice a day. This vitamin assists nerve cells in the production of ATP a principal energy storing molecule.  It is necessary for many chemical reactions in the body.  There have been at least 3 clinical trials of riboflavin using 400 mg per day all of which suggested that migraine frequency can be decreased.  All 3 trials showed significant improvement in over half of migraine sufferers.  The supplement is found in bread, cereal, milk, meat, and poultry.  Most Americans get more riboflavin than  the recommended daily allowance, however riboflavin deficiency is not necessary for the supplements to help prevent headache. Side effects: energizing, green urine   Coenzyme Q10: This is present in almost all cells in the body and is critical component for the conversion of energy.  Recent studies have shown that a nutritional supplement of CoQ10  can reduce the frequency of migraine attacks by improving the energy production of cells as with riboflavin.  Doses of 150 mg twice a day have been shown to be effective.   Melatonin: Increasing evidence shows correlation between melatonin secretion and headache conditions.  Melatonin supplementation has decreased headache intensity and duration.  It is widely used as a sleep aid.  Sleep is natures way of dealing with migraine.  A dose of 3 mg is recommended to start for headaches including cluster headache. Higher doses up to 15 mg has been reviewed for use in Cluster headache and have been used. The rationale behind using melatonin for cluster is that many theories regarding the cause of Cluster headache center around the disruption of the normal circadian rhythm in the brain.  This helps restore the normal circadian rhythm.   HEADACHE DIET: Foods and beverages which may trigger migraine Note that only 20% of headache patients are food sensitive. You will know if you are food sensitive if you get a headache consistently 20 minutes to 2 hours after eating a certain food. Only cut out a food if it causes headaches, otherwise you might remove foods you enjoy! What matters most for diet is to eat a well balanced healthy diet full of vegetables and low fat protein, and to not miss meals.   Chocolate, other sweets ALL cheeses except cottage and cream cheese Dairy products, yogurt, sour cream, ice cream Liver Meat extracts (Bovril, Marmite, meat tenderizers) Meats or fish which have undergone aging, fermenting, pickling or smoking. These include: Hotdogs,salami,Lox,sausage, mortadellas,smoked salmon, pepperoni, Pickled herring Pods of broad bean (English beans, Chinese pea pods, [place] (fava) beans, lima and navy beans Ripe avocado, ripe banana Yeast extracts or active yeast preparations such as Brewer's or Fleishman's (commercial bakes goods are permitted) Tomato based foods, pizza (lasagna, etc.)    MSG (monosodium glutamate) is disguised as many things; look for these common aliases: Monopotassium glutamate Autolysed yeast Hydrolysed protein Sodium caseinate "flavorings" "all natural preservatives" Nutrasweet   Avoid all other foods that convincingly provoke headaches.   Resources: The Dizzy [place] Laundry Your Headache Diet, migrainestrong.com  https://zamora-andrews.com/   Caffeine and Migraine For patients that have migraine, caffeine intake more than 3 days per week can lead to dependency and increased migraine frequency. I would recommend cutting back on your caffeine intake as best you can. The recommended amount of caffeine is 200-300 mg daily, although migraine patients may experience dependency at even lower doses. While you may notice an increase in headache temporarily, cutting back will be helpful for headaches in the long run. For more information on caffeine and migraine, visit: https://americanmigrainefoundation.org/resource-library/caffeine-and-migraine/   Headache Prevention Strategies:   1. Maintain a headache diary; learn to identify and avoid triggers.  - This can be a simple note where you log when you had a headache, associated symptoms, and medications used - There are several smartphone apps developed to help track migraines: Migraine Buddy, Migraine Monitor, Curelator N1-Headache App   Common triggers include: Emotional triggers: Emotional/Upset family or friends Emotional/Upset occupation Business reversal/success Anticipation anxiety Crisis-serious Post-crisis periodNew job/position   Physical triggers: Vacation Day Weekend Strenuous Exercise [place] Alt[place] Location  New Move Menstrual Day Physical Illness Oversleep/Not enough sleep Weather changes Light: Photophobia or light sesnitivity treatment involves a balance between desensitization and reduction in overly strong input. Use dark polarized  glasses outside, but not inside. Avoid bright or fluorescent light, but do not dim environment to the point that going into a normally lit room hurts. Consider FL-41 tint lenses, which reduce the most irritating wavelengths without blocking too much light.  These can be obtained at axonoptics.com or theraspecs.com Foods: see list above.   2. Limit use of acute treatments (over-the-counter medications, triptans, etc.) to no more than 2 days per week or 10 days per month to prevent medication overuse headache (rebound headache).     3. Follow a regular schedule (including weekends and holidays): Don't skip meals. Eat a balanced diet. 8 hours of sleep nightly. Minimize stress. Exercise 30 minutes per day. Being overweight is associated with a 5 times increased risk of chronic migraine. Keep well hydrated and drink 6-8 glasses of water per day.   4. Initiate non-pharmacologic measures at the earliest onset of your headache. Rest and quiet environment. Relax and reduce stress. Breathe2Relax is a free app that can instruct you on    some simple relaxtion and breathing techniques. Http://Dawnbuse.com is a    free website that provides teaching videos on relaxation.  Also, there are  many apps that   can be downloaded for "mindful" relaxation.  An app called YOGA NIDRA will help walk you through mindfulness. Another app called Calm can be downloaded to give you a structured mindfulness guide with daily reminders and skill development. Headspace for guided meditation Mindfulness Based Stress Reduction Online Course: www.palousemindfulness.com Cold compresses.   5. Don't wait!! Take the maximum allowable dosage of prescribed medication at the first sign of migraine.   6. Compliance:  Take prescribed medication regularly as directed and at the first sign of a migraine.   7. Communicate:  Call your physician when problems arise, especially if your headaches change, increase in frequency/severity, or become  associated with neurological symptoms (weakness, numbness, slurred speech, etc.). Proceed to emergency room if you experience new or worsening symptoms or symptoms do not resolve, if you have new neurologic symptoms or if headache is severe, or for any concerning symptom.   8. Headache/pain management therapies: Consider various complementary methods, including medication, behavioral therapy, psychological counselling, biofeedback, massage therapy, acupuncture, dry needling, and other modalities.  Such measures may reduce the need for medications. Counseling for pain management, where patients learn to function and ignore/minimize their pain, seems to work very well.   9. Recommend changing family's attention and focus away from patient's headaches. Instead, emphasize daily activities. If first question of day is 'How are your headaches/Do you have a headache today?', then patient will constantly think about headaches, thus making them worse. Goal is to re-direct attention away from headaches, toward daily activities and other distractions.   10. Helpful Websites: www.AmericanHeadacheSociety.org PatentHood.ch www.headaches.org TightMarket.nl www.achenet.org

## 2023-03-23 ENCOUNTER — Other Ambulatory Visit (HOSPITAL_COMMUNITY): Payer: Self-pay

## 2023-03-24 ENCOUNTER — Other Ambulatory Visit (HOSPITAL_COMMUNITY): Payer: Self-pay

## 2023-03-24 ENCOUNTER — Ambulatory Visit: Payer: Commercial Managed Care - PPO | Admitting: Family Medicine

## 2023-03-24 ENCOUNTER — Encounter: Payer: Self-pay | Admitting: Family Medicine

## 2023-03-24 VITALS — BP 124/80 | HR 84 | Ht 71.0 in | Wt 262.5 lb

## 2023-03-24 DIAGNOSIS — Z8669 Personal history of other diseases of the nervous system and sense organs: Secondary | ICD-10-CM

## 2023-03-24 DIAGNOSIS — G932 Benign intracranial hypertension: Secondary | ICD-10-CM | POA: Diagnosis not present

## 2023-03-24 DIAGNOSIS — G43709 Chronic migraine without aura, not intractable, without status migrainosus: Secondary | ICD-10-CM

## 2023-03-24 DIAGNOSIS — R310 Gross hematuria: Secondary | ICD-10-CM | POA: Diagnosis not present

## 2023-03-24 MED ORDER — RIZATRIPTAN BENZOATE 10 MG PO TBDP
10.0000 mg | ORAL_TABLET | ORAL | 11 refills | Status: DC | PRN
  Filled 2023-03-24: qty 9, 28d supply, fill #0
  Filled 2023-04-19: qty 9, 28d supply, fill #1
  Filled 2023-08-23: qty 9, 28d supply, fill #2
  Filled 2023-12-16: qty 9, 28d supply, fill #3

## 2023-03-24 MED ORDER — AJOVY 225 MG/1.5ML ~~LOC~~ SOAJ
225.0000 mg | SUBCUTANEOUS | 3 refills | Status: DC
  Filled 2023-03-24: qty 4.5, 90d supply, fill #0
  Filled 2023-06-07 – 2023-06-09 (×2): qty 4.5, 90d supply, fill #1
  Filled 2023-11-16: qty 4.5, 90d supply, fill #2

## 2023-03-24 MED ORDER — AMITRIPTYLINE HCL 25 MG PO TABS
ORAL_TABLET | Freq: Every day | ORAL | 3 refills | Status: DC
  Filled 2023-03-24: qty 90, 90d supply, fill #0
  Filled 2023-04-19 – 2023-05-06 (×2): qty 90, 90d supply, fill #1
  Filled 2023-08-23: qty 90, 90d supply, fill #2
  Filled 2023-12-16: qty 90, 90d supply, fill #3

## 2023-03-24 MED ORDER — ACETAZOLAMIDE ER 500 MG PO CP12
ORAL_CAPSULE | ORAL | 3 refills | Status: DC
Start: 2023-03-24 — End: 2024-04-05
  Filled 2023-03-24 – 2023-06-07 (×2): qty 270, 90d supply, fill #0
  Filled 2023-08-23: qty 270, 90d supply, fill #1

## 2023-03-25 ENCOUNTER — Telehealth: Payer: Self-pay | Admitting: Family Medicine

## 2023-03-25 ENCOUNTER — Other Ambulatory Visit (HOSPITAL_COMMUNITY): Payer: Self-pay

## 2023-03-25 NOTE — Telephone Encounter (Signed)
Rx needs PA.  Please route replies to Pod 1.

## 2023-03-25 NOTE — Telephone Encounter (Signed)
FYI-Pt notifying NP, [facility] still waiting the authorization for Fremanezumab-vfrm (AJOVY) 225 MG/1.5ML SOAJ

## 2023-03-26 ENCOUNTER — Encounter (HOSPITAL_COMMUNITY): Payer: Self-pay

## 2023-03-26 ENCOUNTER — Telehealth: Payer: Self-pay

## 2023-03-26 ENCOUNTER — Other Ambulatory Visit (HOSPITAL_COMMUNITY): Payer: Self-pay

## 2023-03-26 DIAGNOSIS — Z0001 Encounter for general adult medical examination with abnormal findings: Secondary | ICD-10-CM | POA: Diagnosis not present

## 2023-03-26 DIAGNOSIS — M2550 Pain in unspecified joint: Secondary | ICD-10-CM | POA: Diagnosis not present

## 2023-03-26 MED ORDER — WEGOVY 0.25 MG/0.5ML ~~LOC~~ SOAJ
0.2500 mg | SUBCUTANEOUS | 2 refills | Status: DC
  Filled 2023-03-26: qty 2, 28d supply, fill #0

## 2023-03-26 NOTE — Telephone Encounter (Signed)
Pharmacy Patient Advocate Encounter   Received notification from [employer] that prior authorization for AJOVY (fremanezumab-vfrm) injection 225MG /1.5ML auto-injectors is required/requested.   PA submitted to [facility] via CoverMyMeds Key or (Medicaid) confirmation #  [id number] Status is pending

## 2023-03-29 ENCOUNTER — Other Ambulatory Visit: Payer: Self-pay

## 2023-03-29 ENCOUNTER — Encounter (INDEPENDENT_AMBULATORY_CARE_PROVIDER_SITE_OTHER): Payer: Self-pay

## 2023-03-29 ENCOUNTER — Other Ambulatory Visit (HOSPITAL_COMMUNITY): Payer: Self-pay

## 2023-03-30 ENCOUNTER — Ambulatory Visit (HOSPITAL_COMMUNITY)
Admission: RE | Admit: 2023-03-30 | Discharge: 2023-03-30 | Disposition: A | Discharge status: Home / Self Care | Payer: Commercial Managed Care - PPO | Source: Ambulatory Visit | Attending: Cardiovascular Disease | Admitting: Cardiovascular Disease

## 2023-03-30 ENCOUNTER — Other Ambulatory Visit (HOSPITAL_COMMUNITY): Payer: Self-pay

## 2023-03-30 ENCOUNTER — Encounter (HOSPITAL_COMMUNITY): Admission: RE | Admit: 2023-03-30 | Payer: Commercial Managed Care - PPO | Source: Ambulatory Visit

## 2023-03-30 ENCOUNTER — Encounter (HOSPITAL_COMMUNITY): Payer: Self-pay

## 2023-03-30 DIAGNOSIS — R072 Precordial pain: Secondary | ICD-10-CM

## 2023-03-30 DIAGNOSIS — K449 Diaphragmatic hernia without obstruction or gangrene: Secondary | ICD-10-CM | POA: Diagnosis not present

## 2023-03-30 LAB — NM PET CT CARDIAC PERFUSION MULTI W/ABSOLUTE BLOODFLOW
LV dias vol: 92 mL (ref 46–106)
LV sys vol: 34 mL
MBFR: 2.85
Nuc Rest EF: 63 %
Nuc Stress EF: 69 %
Rest MBF: 0.79 ml/g/min
Rest Nuclear Isotope Dose: 30 mCi
ST Depression (mm): 0 mm
Stress MBF: 2.25 ml/g/min
Stress Nuclear Isotope Dose: 30.1 mCi

## 2023-03-30 MED ORDER — REGADENOSON 0.4 MG/5ML IV SOLN
INTRAVENOUS | Status: AC
  Filled 2023-03-30: qty 5

## 2023-03-30 MED ORDER — REGADENOSON 0.4 MG/5ML IV SOLN
0.4000 mg | Freq: Once | INTRAVENOUS | Status: AC
  Administered 2023-03-30: 0.4 mg via INTRAVENOUS

## 2023-03-30 MED ORDER — AMOXICILLIN 500 MG PO CAPS
2000.0000 mg | ORAL_CAPSULE | ORAL | 0 refills | Status: DC
  Filled 2023-03-30: qty 12, 3d supply, fill #0

## 2023-03-30 MED ORDER — RUBIDIUM RB82 GENERATOR (RUBYFILL)
30.0800 | PACK | Freq: Once | INTRAVENOUS | Status: AC
  Administered 2023-03-30: 30.08 via INTRAVENOUS

## 2023-03-30 MED ORDER — RUBIDIUM RB82 GENERATOR (RUBYFILL)
30.0100 | PACK | Freq: Once | INTRAVENOUS | Status: AC
  Administered 2023-03-30: 30.01 via INTRAVENOUS

## 2023-03-30 NOTE — Telephone Encounter (Signed)
Noted  

## 2023-03-30 NOTE — Progress Notes (Addendum)
Patient presents for a cardiac PET stress test and tolerated procedure without incident. Patient maintained acceptable vital signs throughout the test and was offered caffeine after test.  Patient ambulated out of department with a steady gait. Patient denies symptoms.  Azalee Course PA at bedside, spoke with patient at completion of test.

## 2023-03-30 NOTE — Telephone Encounter (Signed)
Pharmacy Patient Advocate Encounter  Prior Authorization for AJOVY (fremanezumab-vfrm) injection 225MG /1.5ML auto-injectors has been APPROVED by [employer] from 03/26/2023 to 03/24/2024.   PA # PA Case ID: [id number]  Unable to get copay info-systems are down at this time.

## 2023-04-15 ENCOUNTER — Other Ambulatory Visit (HOSPITAL_COMMUNITY): Payer: Self-pay

## 2023-04-15 MED ORDER — OZEMPIC (0.25 OR 0.5 MG/DOSE) 2 MG/3ML ~~LOC~~ SOPN
0.2500 mg | PEN_INJECTOR | SUBCUTANEOUS | 2 refills | Status: DC
  Filled 2023-04-15: qty 3, 28d supply, fill #0
  Filled 2023-05-03: qty 3, 56d supply, fill #0

## 2023-04-16 DIAGNOSIS — D511 Vitamin B12 deficiency anemia due to selective vitamin B12 malabsorption with proteinuria: Secondary | ICD-10-CM | POA: Diagnosis not present

## 2023-04-19 ENCOUNTER — Other Ambulatory Visit (HOSPITAL_COMMUNITY): Payer: Self-pay

## 2023-04-19 ENCOUNTER — Encounter (HOSPITAL_COMMUNITY): Payer: Self-pay

## 2023-04-20 ENCOUNTER — Other Ambulatory Visit: Payer: Self-pay

## 2023-04-20 ENCOUNTER — Encounter: Payer: Self-pay | Admitting: Family Medicine

## 2023-04-20 ENCOUNTER — Other Ambulatory Visit (HOSPITAL_COMMUNITY): Payer: Self-pay

## 2023-04-21 ENCOUNTER — Other Ambulatory Visit (HOSPITAL_COMMUNITY): Payer: Self-pay

## 2023-04-22 ENCOUNTER — Other Ambulatory Visit (HOSPITAL_COMMUNITY): Payer: Self-pay

## 2023-05-02 ENCOUNTER — Encounter (HOSPITAL_COMMUNITY): Payer: Self-pay

## 2023-05-03 ENCOUNTER — Other Ambulatory Visit (HOSPITAL_COMMUNITY): Payer: Self-pay

## 2023-05-06 ENCOUNTER — Other Ambulatory Visit (HOSPITAL_COMMUNITY): Payer: Self-pay

## 2023-05-06 DIAGNOSIS — R768 Other specified abnormal immunological findings in serum: Secondary | ICD-10-CM | POA: Diagnosis not present

## 2023-05-06 DIAGNOSIS — M170 Bilateral primary osteoarthritis of knee: Secondary | ICD-10-CM | POA: Diagnosis not present

## 2023-05-06 DIAGNOSIS — M2559 Pain in other specified joint: Secondary | ICD-10-CM | POA: Diagnosis not present

## 2023-05-06 DIAGNOSIS — E669 Obesity, unspecified: Secondary | ICD-10-CM | POA: Diagnosis not present

## 2023-05-06 DIAGNOSIS — Z6836 Body mass index (BMI) 36.0-36.9, adult: Secondary | ICD-10-CM | POA: Diagnosis not present

## 2023-05-06 DIAGNOSIS — M7989 Other specified soft tissue disorders: Secondary | ICD-10-CM | POA: Diagnosis not present

## 2023-05-14 ENCOUNTER — Other Ambulatory Visit (HOSPITAL_COMMUNITY): Payer: Self-pay

## 2023-05-14 DIAGNOSIS — K5000 Crohn's disease of small intestine without complications: Secondary | ICD-10-CM | POA: Diagnosis not present

## 2023-05-15 ENCOUNTER — Other Ambulatory Visit (HOSPITAL_COMMUNITY): Payer: Self-pay

## 2023-05-18 ENCOUNTER — Other Ambulatory Visit (HOSPITAL_COMMUNITY): Payer: Self-pay

## 2023-05-20 ENCOUNTER — Other Ambulatory Visit (HOSPITAL_COMMUNITY): Payer: Self-pay

## 2023-06-01 ENCOUNTER — Other Ambulatory Visit (HOSPITAL_COMMUNITY): Payer: Self-pay

## 2023-06-07 ENCOUNTER — Other Ambulatory Visit (INDEPENDENT_AMBULATORY_CARE_PROVIDER_SITE_OTHER): Payer: Self-pay | Admitting: Gastroenterology

## 2023-06-07 ENCOUNTER — Encounter (HOSPITAL_COMMUNITY): Payer: Self-pay

## 2023-06-07 ENCOUNTER — Other Ambulatory Visit (HOSPITAL_COMMUNITY): Payer: Self-pay

## 2023-06-07 DIAGNOSIS — R110 Nausea: Secondary | ICD-10-CM

## 2023-06-07 MED ORDER — CYCLOBENZAPRINE HCL 5 MG PO TABS
5.0000 mg | ORAL_TABLET | Freq: Three times a day (TID) | ORAL | 2 refills | Status: DC | PRN
  Filled 2023-06-07: qty 40, 14d supply, fill #0
  Filled 2023-08-23: qty 40, 14d supply, fill #1
  Filled 2023-12-16 – 2024-04-05 (×2): qty 40, 14d supply, fill #2

## 2023-06-08 ENCOUNTER — Other Ambulatory Visit: Payer: Self-pay

## 2023-06-08 ENCOUNTER — Other Ambulatory Visit (HOSPITAL_COMMUNITY): Payer: Self-pay

## 2023-06-08 MED ORDER — ALBUTEROL SULFATE HFA 108 (90 BASE) MCG/ACT IN AERS
INHALATION_SPRAY | RESPIRATORY_TRACT | 9 refills | Status: DC
  Filled 2023-06-08: qty 6.7, 17d supply, fill #0
  Filled 2023-11-16: qty 6.7, 17d supply, fill #1

## 2023-06-08 MED ORDER — AMLODIPINE BESYLATE 5 MG PO TABS
5.0000 mg | ORAL_TABLET | Freq: Every day | ORAL | 2 refills | Status: DC
  Filled 2023-06-08: qty 90, 90d supply, fill #0
  Filled 2023-08-23: qty 90, 90d supply, fill #1
  Filled 2023-12-16 – 2024-04-05 (×2): qty 90, 90d supply, fill #2

## 2023-06-09 ENCOUNTER — Encounter (HOSPITAL_COMMUNITY): Payer: Self-pay | Admitting: Pharmacist

## 2023-06-09 ENCOUNTER — Other Ambulatory Visit (HOSPITAL_COMMUNITY): Payer: Self-pay

## 2023-06-09 MED ORDER — MONTELUKAST SODIUM 10 MG PO TABS
10.0000 mg | ORAL_TABLET | Freq: Every day | ORAL | 3 refills | Status: AC
  Filled 2023-06-09: qty 90, 90d supply, fill #0
  Filled 2023-08-23: qty 90, 90d supply, fill #1
  Filled 2023-12-16 – 2024-04-05 (×2): qty 90, 90d supply, fill #2

## 2023-06-09 MED ORDER — ONDANSETRON 4 MG PO TBDP
4.0000 mg | ORAL_TABLET | Freq: Three times a day (TID) | ORAL | 1 refills | Status: DC | PRN
  Filled 2023-06-09: qty 90, 30d supply, fill #0
  Filled 2023-08-23: qty 90, 30d supply, fill #1

## 2023-06-10 ENCOUNTER — Other Ambulatory Visit (HOSPITAL_COMMUNITY): Payer: Self-pay

## 2023-06-11 DIAGNOSIS — D511 Vitamin B12 deficiency anemia due to selective vitamin B12 malabsorption with proteinuria: Secondary | ICD-10-CM | POA: Diagnosis not present

## 2023-06-17 ENCOUNTER — Other Ambulatory Visit (HOSPITAL_COMMUNITY): Payer: Self-pay

## 2023-06-22 ENCOUNTER — Other Ambulatory Visit (HOSPITAL_COMMUNITY): Payer: Self-pay | Admitting: Family Medicine

## 2023-06-22 DIAGNOSIS — Z1231 Encounter for screening mammogram for malignant neoplasm of breast: Secondary | ICD-10-CM

## 2023-06-23 DIAGNOSIS — R310 Gross hematuria: Secondary | ICD-10-CM | POA: Diagnosis not present

## 2023-06-23 DIAGNOSIS — R1084 Generalized abdominal pain: Secondary | ICD-10-CM | POA: Diagnosis not present

## 2023-06-23 DIAGNOSIS — N200 Calculus of kidney: Secondary | ICD-10-CM | POA: Diagnosis not present

## 2023-06-28 ENCOUNTER — Ambulatory Visit (INDEPENDENT_AMBULATORY_CARE_PROVIDER_SITE_OTHER): Payer: Commercial Managed Care - PPO | Admitting: Otolaryngology

## 2023-06-28 ENCOUNTER — Other Ambulatory Visit (HOSPITAL_COMMUNITY): Payer: Self-pay

## 2023-06-28 ENCOUNTER — Encounter (INDEPENDENT_AMBULATORY_CARE_PROVIDER_SITE_OTHER): Payer: Self-pay | Admitting: Otolaryngology

## 2023-06-28 VITALS — BP 161/93 | HR 87 | Ht 71.5 in | Wt 262.0 lb

## 2023-06-28 DIAGNOSIS — J342 Deviated nasal septum: Secondary | ICD-10-CM

## 2023-06-28 DIAGNOSIS — R490 Dysphonia: Secondary | ICD-10-CM | POA: Diagnosis not present

## 2023-06-28 DIAGNOSIS — J329 Chronic sinusitis, unspecified: Secondary | ICD-10-CM

## 2023-06-28 DIAGNOSIS — R0982 Postnasal drip: Secondary | ICD-10-CM | POA: Diagnosis not present

## 2023-06-28 DIAGNOSIS — J358 Other chronic diseases of tonsils and adenoids: Secondary | ICD-10-CM | POA: Diagnosis not present

## 2023-06-28 DIAGNOSIS — K219 Gastro-esophageal reflux disease without esophagitis: Secondary | ICD-10-CM

## 2023-06-28 DIAGNOSIS — R0981 Nasal congestion: Secondary | ICD-10-CM

## 2023-06-28 DIAGNOSIS — J029 Acute pharyngitis, unspecified: Secondary | ICD-10-CM | POA: Diagnosis not present

## 2023-06-28 DIAGNOSIS — J343 Hypertrophy of nasal turbinates: Secondary | ICD-10-CM

## 2023-06-28 DIAGNOSIS — J3501 Chronic tonsillitis: Secondary | ICD-10-CM

## 2023-06-28 DIAGNOSIS — Z9109 Other allergy status, other than to drugs and biological substances: Secondary | ICD-10-CM

## 2023-06-28 MED ORDER — SULFAMETHOXAZOLE-TRIMETHOPRIM 800-160 MG PO TABS
1.0000 | ORAL_TABLET | Freq: Two times a day (BID) | ORAL | 0 refills | Status: DC
  Filled 2023-06-28: qty 20, 10d supply, fill #0

## 2023-06-28 MED ORDER — FLUTICASONE PROPIONATE 50 MCG/ACT NA SUSP
2.0000 | Freq: Every day | NASAL | 6 refills | Status: DC
  Filled 2023-06-28 – 2023-11-16 (×2): qty 16, 30d supply, fill #0

## 2023-06-28 NOTE — Patient Instructions (Addendum)
-   continue allergy pills and start Flonase twice daily - gargle with salt water - continue Prilosec and start alginates (reflux gourmet) - start nasal saline rinses  - return in 4 months   Lloyd Huger Med Nasal Saline Rinse   - start nasal saline rinses with NeilMed Bottle available over the counter or online to help with nasal congestion    - Take Reflux Gourmet (natural supplement available on Amazon) to help with symptoms of chronic throat irritation

## 2023-06-28 NOTE — Progress Notes (Signed)
ENT CONSULT:  Reason for Consult: Chronic sore throat tonsil stones nasal congestion hoarseness  HPI: [name] is an 57 y.o. female with hx of OSA currently not on CPAP, history of Crohn's, history of idiopathic intracranial hypertension, history of chronic nasal congestion and environmental allergies, pan positive prior allergy testing (qualified for allergy shots), no prior allergy shots, here for chronic sore throat tonsil stones nasal congestion and recurrent hoarseness present for over several years and appears to be happening more frequently.   She has had chronic tonsil inflammation and recurrent tonsil infections as a child, but never had tonsillectomy.  Recently she feels she has more frequent episodes of tonsil stones, it causes her to have gagging and sore throat. She reports that once every 2-3 months she has scratchy throat, and hoarseness. She does not go to the doctor or takes asked for her symptoms.  Tried gargling with salt water without significant relief.  She has Chroh's disease, on Entyvio, injections every month (IV therapy). She is not on oral steroids. She had last relapse 2 yrs ago. She has hx of asthma, and environmental allergies. She gets cough and throat tightness when she feels a scent of perfume/other irritants. She is on Singulair/Benadryl. She is on Pepcid for GERD. She has multiple episodes of anaphylaxis, has epi pen, from wasp stings and by inhaling lemon grass.   She is f/b [facility] for migraines and IIH. She has OSA, not on CPAP therapy.     Records Reviewed:  03/24/2023 ALL: Pev returns for follow up for migraines. She was last seen 09/2022. We switched from Nurtec to Ajovy and continued acetazolamide 500/1000mg , amitriptyline  and rizatriptan PRN. Since, she reports headaches have improved. She was having 4-8 migraines per month, now having 2-3. Rizatriptan seems to work well. She does have pressure in the back of head. Recent eye exam  reportedly normal. BP is well managed. She has a history of sleep apnea but did not like CPAP therapy. She had lost weight but has gained it back. She is hoping to see ENT to discuss tonsillectomy due to chronic tonsil stones.     Past Medical History:  Diagnosis Date   Anxiety    Arthritis    Asthma    Cervical hyperplasia    hisotry of   Chest pain    cath 2005 norm cors, repeat cath Feb 2011 normal cors and only minimally  elevated pulmonary pressures   Crohn disease (HCC)    Diverticulitis of colon 07/17/2014   Diverticulosis    Dyspnea    GERD (gastroesophageal reflux disease)    Hiatal hernia    History of iron deficiency anemia    Hypertension    Idiopathic intracranial hypertension    Per patient   Migraines    Obesity    Palpitations    Pneumonia 2008   14 days hospitalization, bilateral   PONV (postoperative nausea and vomiting)    Sleep apnea sleep study 11/03/2009    cpap; does sometimes, doesnt use every night. weight loss no longer using CPAP   Vitamin B deficiency     Past Surgical History:  Procedure Laterality Date   ABDOMINAL HYSTERECTOMY     ANKLE ARTHROSCOPY  06/01/2012   Procedure: ANKLE ARTHROSCOPY;  Surgeon: Sherri Rad, MD;  Location: [facility];  Service: Orthopedics;  Laterality: Left;  left ankle arthorsocpy with extensive debridement and gastroc slide   ANKLE SURGERY Left 05/11/2018   BIOPSY  04/26/2019  Procedure: BIOPSY;  Surgeon: Malissa Hippo, MD;  [facility];  Service: Endoscopy;;  ileum colon   BIOPSY  03/25/2022   Procedure: BIOPSY;  Surgeon: Dolores Frame, MD;  Location: [facility];  Service: Gastroenterology;;   CARDIAC CATHETERIZATION  11/22/2009 and 2005   WNL   CESAREAN SECTION     CHOLECYSTECTOMY  09/08/2006   lap. chole.   CHONDROPLASTY  06/17/2012   Procedure: CHONDROPLASTY;  Surgeon: Vickki Hearing, MD;  Location: [facility];  Service: Orthopedics;  Laterality: Right;  right  patella   COLONOSCOPY N/A 04/26/2019   Rehman: a few erosions in ternimal ileum, two erosions in cecum (assocaited with acute inflammation) diverticulosis in sigmoid hepatic flexure and ascending colon, external hemorrhoids   COLONOSCOPY WITH PROPOFOL N/A 03/25/2022   Procedure: COLONOSCOPY WITH PROPOFOL;  Surgeon: Dolores Frame, MD;  [facility];  Service: Gastroenterology;  Laterality: N/A;  830 ASA 1   ESOPHAGOGASTRODUODENOSCOPY N/A 05/14/2015   Procedure: ESOPHAGOGASTRODUODENOSCOPY (EGD);  Surgeon: Malissa Hippo, MD;  [facility];  Service: Endoscopy;  Laterality: N/A;  730   ESOPHAGOGASTRODUODENOSCOPY (EGD) WITH PROPOFOL N/A 03/25/2022   Procedure: ESOPHAGOGASTRODUODENOSCOPY (EGD) WITH PROPOFOL;  Surgeon: Dolores Frame, MD;  [facility];  Service: Gastroenterology;  Laterality: N/A;   GIVENS CAPSULE STUDY N/A 05/24/2019   Procedure: GIVENS CAPSULE STUDY;  Surgeon: Malissa Hippo, MD;  [facility];  Service: Endoscopy;  Laterality: N/A;  7:30   INGUINAL HERNIA REPAIR  10/30/2008   right   KNEE ARTHROSCOPY Right 2013   KNEE ARTHROSCOPY WITH LATERAL MENISECTOMY Left 12/23/2016   Procedure: LEFT KNEE ARTHROSCOPY WITH LATERAL MENISECTOMY;  Surgeon: Vickki Hearing, MD;  Location: [facility];  Service: Orthopedics;  Laterality: Left;   KNEE ARTHROSCOPY WITH MEDIAL MENISECTOMY Left 12/23/2016   Procedure: LEFT KNEE ARTHROSCOPY WITH MEDIAL MENISECTOMY CHONDROPLASTY PATELLA  AND MEDIAL FEMORAL CONDYLE LEFT KNEE;  Surgeon: Vickki Hearing, MD;  Location: [facility];  Service: Orthopedics;  Laterality: Left;   POLYPECTOMY  03/25/2022   Procedure: POLYPECTOMY;  Surgeon: Dolores Frame, MD;  [facility];  Service: Gastroenterology;;   SHOULDER ARTHROSCOPY WITH SUBACROMIAL DECOMPRESSION AND BICEP TENDON REPAIR Left 12/21/2017   Procedure: Left shoulder arthroscopic biceps tenodesis, SAD, DCR and labrum  debridement;  Surgeon: Yolonda Kida, MD;  Location: [facility];  Service: Orthopedics;  Laterality: Left;  120 mins   SHOULDER SURGERY     right x 2    TOTAL KNEE ARTHROPLASTY Left 06/06/2019   Procedure: TOTAL KNEE ARTHROPLASTY;  Surgeon: Durene Romans, MD;  Location: [facility];  Service: Orthopedics;  Laterality: Left;  70 mins   TOTAL KNEE ARTHROPLASTY Right 01/23/2021   Procedure: TOTAL KNEE ARTHROPLASTY;  Surgeon: Durene Romans, MD;  Location: [facility];  Service: Orthopedics;  Laterality: Right;  70 mins    Family History  Problem Relation Age of Onset   Hypertension Mother    Atrial fibrillation Mother    Arthritis Mother    Lupus Father    COPD Father    Rheum arthritis Father    Sarcoidosis Father    Lupus Sister    Congestive Heart Failure Maternal Grandmother    Migraines Neg Hx    Headache Neg Hx     Social History:  reports that she has never smoked. She has never been exposed to tobacco smoke. She has never used smokeless tobacco. She reports that she does not drink alcohol and  does not use drugs.  Allergies:  Allergies  Allergen Reactions   Bee Pollen Anaphylaxis   Hydromorphone Hcl     Other Reaction(s): headache   Iodinated Contrast Media Anaphylaxis, Hives and Itching   Other Shortness Of Breath    Lemon grass   Wasp Venom Anaphylaxis and Shortness Of Breath   Pollen Extract Swelling   Adhesive [Tape] Rash   Omnipaque [Iohexol] Hives and Nausea Only    Pt. Was premedicated with emergent protocol.    Medications: I have reviewed the patient's current medications.  The PMH, PSH, Medications, Allergies, and SH were reviewed and updated.  ROS: Constitutional: Negative for fever, weight loss and weight gain. Cardiovascular: Negative for chest pain and dyspnea on exertion. Respiratory: Is not experiencing shortness of breath at rest. Gastrointestinal: Negative for nausea and vomiting. Neurological: Negative for headaches. Psychiatric: The patient is not  nervous/anxious  Blood pressure (!) 161/93, pulse 87, height 5' 11.5" (1.816 m), weight 262 lb (118.8 kg), SpO2 96%.  PHYSICAL EXAM:  Exam: General: Well-developed, well-nourished Communication and Voice: Mildly raspy Respiratory Respiratory effort: Equal inspiration and expiration without stridor Cardiovascular Peripheral Vascular: Warm extremities with equal color/perfusion Eyes: No nystagmus with equal extraocular motion bilaterally Neuro/Psych/Balance: Patient oriented to person, place, and time; Appropriate mood and affect; Gait is intact with no imbalance; Cranial nerves I-XII are intact Head and Face Inspection: Normocephalic and atraumatic without mass or lesion Palpation: Facial skeleton intact without bony stepoffs Salivary Glands: No mass or tenderness Facial Strength: Facial motility symmetric and full bilaterally ENT Pinna: External ear intact and fully developed External canal: Canal is patent with intact skin Tympanic Membrane: Clear effusion on the right side, TM intact and clear b/l.  No air-fluid level on the left side. External Nose: No scar or anatomic deformity Internal Nose: Septum is deviated to the left. No polyp, minimal purulence on the left inferior turbinate and middle turbinate. Mucosal edema and erythema present.  Bilateral inferior turbinate hypertrophy.  Lips, Teeth, and gums: Mucosa and teeth intact and viable TMJ: No pain to palpation with full mobility Oral cavity/oropharynx: No erythema or exudate, no lesions present, prominent lingual tonsils Residual tonsillar tissue 1+ bilaterally no visible tonsil stones no exudate or significant erythema Nasopharynx: No mass or lesion with intact mucosa Hypopharynx: Intact mucosa without pooling of secretions Larynx Glottic: Full true vocal cord mobility without lesion or mass Supraglottic: Normal appearing epiglottis and AE folds Interarytenoid Space: Moderate pachydermia edema Subglottic Space: not seen  well Neck Neck and Trachea: Midline trachea without mass or lesion Thyroid: No mass or nodularity Lymphatics: No lymphadenopathy  Procedure: Preoperative diagnosis: Hoarseness sore throat  Postoperative diagnosis:   Same  Procedure: Flexible fiberoptic laryngoscopy  Surgeon: Ashok Croon, MD  Anesthesia: Topical lidocaine and Afrin Complications: None Condition is stable throughout exam  Indications and consent:  The patient presents to the clinic with Indirect laryngoscopy view was incomplete. Thus it was recommended that they undergo a flexible fiberoptic laryngoscopy. All of the risks, benefits, and potential complications were reviewed with the patient preoperatively and verbal informed consent was obtained.  Procedure: The patient was seated upright in the clinic. Topical lidocaine and Afrin were applied to the nasal cavity. After adequate anesthesia had occurred, I then proceeded to pass the flexible telescope into the nasal cavity. The nasal cavity was patent without rhinorrhea or polyp. The nasopharynx was also patent without mass or lesion. The base of tongue was visualized and was normal. There were no signs of pooling of  secretions in the piriform sinuses. The true vocal folds were mobile bilaterally. There were no signs of glottic or supraglottic mucosal lesion or mass. There was moderate interarytenoid pachydermia and post cricoid edema. The telescope was then slowly withdrawn and the patient tolerated the procedure throughout.   PROCEDURE NOTE: nasal endoscopy  Preoperative diagnosis: chronic sinusitis symptoms  Postoperative diagnosis: same + left septal deviation inferior turbinate hypertrophy  Procedure: Diagnostic nasal endoscopy (16606)  Surgeon: Ashok Croon, M.D.  Anesthesia: Topical lidocaine and Afrin  H&P REVIEW: The patient's history and physical were reviewed today prior to procedure. All medications were reviewed and updated as  well. Complications: None Condition is stable throughout exam Indications and consent: The patient presents with symptoms of chronic sinusitis not responding to previous therapies. All the risks, benefits, and potential complications were reviewed with the patient preoperatively and informed consent was obtained. The time out was completed with confirmation of the correct procedure.   Procedure: The patient was seated upright in the clinic. Topical lidocaine and Afrin were applied to the nasal cavity. After adequate anesthesia had occurred, the rigid nasal endoscope was passed into the nasal cavity. The nasal mucosa, turbinates, septum, and sinus drainage pathways were visualized bilaterally. This revealed minimal purulence along the left and middle inferior turbinate, and there was evidence of left-sided septal deviation. There were no polyps or sites of significant inflammation. The mucosa was intact and there were no crusting present. The scope was then slowly withdrawn and the patient tolerated the procedure well. There were no complications or blood loss.    Studies Reviewed: CXR 10/16/22 IMPRESSION: No active cardiopulmonary disease  EGD 03/25/22   Assessment/Plan: Encounter Diagnoses  Name Primary?   Gastroesophageal reflux disease without esophagitis    Nasal congestion    Environmental allergies    Hypertrophy of inferior nasal turbinate    Post-nasal drip    Sore throat    Chronic tonsillitis Yes   Tonsil stone    Hoarseness [R49.0]    hx of OSA currently not on CPAP, history of Crohn's, GERD, history of idiopathic intracranial hypertension, history of chronic nasal congestion and environmental allergies, pan positive prior allergy testing (qualified for allergy shots), no prior allergy shots, here for chronic sore throat tonsil stones nasal congestion and recurrent hoarseness present for over several years and appears to be happening more frequently.   Exam today including  bilateral nasal endoscopy and flexible laryngoscopy demonstrated residual tonsillar tissue on plus bilaterally without obvious tonsil stones or exudate or erythema, prominent lingual tonsils along the base of the tongue, and postcricoid changes consistent with GERD LPR moderate to severe.  Nasal endoscopy with septal deviation significant for turbinate hypertrophy and evidence of minimal purulence along the inferior and middle turbinates particularly on the left side, otherwise clear nasal secretions and evidence of mucosal edema and erythema.  1.Chronic sore throat and chronic tonsillitis -suspect postnasal drainage and/or untreated GERD LPR is contributing factors and her symptoms of sore throat - gargle with salt water  -Management of PND and GERD LPR as below -Due to small amount of residual tonsillar tissue.  No history of recurrent tonsillitis requiring antibiotics will hold off on surgical interventions at this time -Will consider in the future if medical management will fail to improve her symptoms 2.  Nasal congestion septal deviation inferior turban hypertrophy postnasal drainage and environmental allergies -Continue Singulair 10 mg daily and Claritin 10 mg daily -Continue Flonase 2 puffs bilateral nares twice daily -Nasal saline rinses 3.  GERD LPR -Continue Pepcid 40 mg daily -Diet and lifestyle changes to minimize reflux -Trial of reflux Gourmet  She will return in 4 months for symptom check   Thank you for allowing me to participate in the care of this patient. Please do not hesitate to contact me with any questions or concerns.   Ashok Croon, MD Otolaryngology [facility] Phone: [phone] Fax: [phone]    06/28/2023, 2:52 PM

## 2023-07-06 ENCOUNTER — Other Ambulatory Visit (HOSPITAL_COMMUNITY): Payer: Self-pay

## 2023-07-07 ENCOUNTER — Other Ambulatory Visit (HOSPITAL_COMMUNITY): Payer: Self-pay

## 2023-07-08 DIAGNOSIS — K5000 Crohn's disease of small intestine without complications: Secondary | ICD-10-CM | POA: Diagnosis not present

## 2023-08-09 ENCOUNTER — Ambulatory Visit (HOSPITAL_COMMUNITY)
Admission: RE | Admit: 2023-08-09 | Discharge: 2023-08-09 | Disposition: A | Discharge status: Home / Self Care | Payer: Commercial Managed Care - PPO | Source: Ambulatory Visit | Attending: Family Medicine | Admitting: Family Medicine

## 2023-08-09 DIAGNOSIS — Z1231 Encounter for screening mammogram for malignant neoplasm of breast: Secondary | ICD-10-CM | POA: Insufficient documentation

## 2023-08-11 ENCOUNTER — Other Ambulatory Visit (HOSPITAL_COMMUNITY): Payer: Self-pay | Admitting: Family Medicine

## 2023-08-11 DIAGNOSIS — R928 Other abnormal and inconclusive findings on diagnostic imaging of breast: Secondary | ICD-10-CM

## 2023-08-13 DIAGNOSIS — D511 Vitamin B12 deficiency anemia due to selective vitamin B12 malabsorption with proteinuria: Secondary | ICD-10-CM | POA: Diagnosis not present

## 2023-08-17 ENCOUNTER — Ambulatory Visit (HOSPITAL_COMMUNITY)
Admission: RE | Admit: 2023-08-17 | Discharge: 2023-08-17 | Disposition: A | Discharge status: Home / Self Care | Payer: Commercial Managed Care - PPO | Source: Ambulatory Visit | Attending: Family Medicine | Admitting: Family Medicine

## 2023-08-17 ENCOUNTER — Encounter (HOSPITAL_COMMUNITY): Payer: Self-pay

## 2023-08-17 DIAGNOSIS — R928 Other abnormal and inconclusive findings on diagnostic imaging of breast: Secondary | ICD-10-CM

## 2023-08-17 DIAGNOSIS — R92313 Mammographic fatty tissue density, bilateral breasts: Secondary | ICD-10-CM | POA: Diagnosis not present

## 2023-08-18 ENCOUNTER — Other Ambulatory Visit (HOSPITAL_COMMUNITY): Payer: Self-pay | Admitting: Family Medicine

## 2023-08-18 DIAGNOSIS — N63 Unspecified lump in unspecified breast: Secondary | ICD-10-CM

## 2023-08-19 ENCOUNTER — Other Ambulatory Visit (HOSPITAL_COMMUNITY): Payer: Self-pay | Admitting: Family Medicine

## 2023-08-19 DIAGNOSIS — R1030 Lower abdominal pain, unspecified: Secondary | ICD-10-CM | POA: Diagnosis not present

## 2023-08-19 DIAGNOSIS — K5790 Diverticulosis of intestine, part unspecified, without perforation or abscess without bleeding: Secondary | ICD-10-CM

## 2023-08-19 DIAGNOSIS — R1032 Left lower quadrant pain: Secondary | ICD-10-CM

## 2023-08-23 ENCOUNTER — Other Ambulatory Visit (HOSPITAL_COMMUNITY): Payer: Self-pay

## 2023-08-23 MED ORDER — PREDNISONE 50 MG PO TABS
ORAL_TABLET | ORAL | 0 refills | Status: DC
  Filled 2023-08-23: qty 3, 1d supply, fill #0

## 2023-08-23 MED ORDER — LOSARTAN POTASSIUM 100 MG PO TABS
100.0000 mg | ORAL_TABLET | Freq: Every day | ORAL | 1 refills | Status: AC
  Filled 2023-08-23: qty 90, 90d supply, fill #0
  Filled 2023-12-16 – 2024-04-05 (×2): qty 90, 90d supply, fill #1

## 2023-08-24 ENCOUNTER — Other Ambulatory Visit (HOSPITAL_COMMUNITY): Payer: Self-pay

## 2023-08-24 ENCOUNTER — Encounter (HOSPITAL_COMMUNITY): Payer: Self-pay

## 2023-08-24 ENCOUNTER — Ambulatory Visit (HOSPITAL_COMMUNITY)
Admission: RE | Admit: 2023-08-24 | Discharge: 2023-08-24 | Disposition: A | Discharge status: Home / Self Care | Payer: Commercial Managed Care - PPO | Source: Ambulatory Visit | Attending: Family Medicine | Admitting: Family Medicine

## 2023-08-24 DIAGNOSIS — K5790 Diverticulosis of intestine, part unspecified, without perforation or abscess without bleeding: Secondary | ICD-10-CM | POA: Diagnosis not present

## 2023-08-24 DIAGNOSIS — R1030 Lower abdominal pain, unspecified: Secondary | ICD-10-CM | POA: Diagnosis not present

## 2023-08-24 DIAGNOSIS — R1032 Left lower quadrant pain: Secondary | ICD-10-CM | POA: Insufficient documentation

## 2023-08-24 DIAGNOSIS — R109 Unspecified abdominal pain: Secondary | ICD-10-CM | POA: Diagnosis not present

## 2023-08-24 DIAGNOSIS — K5730 Diverticulosis of large intestine without perforation or abscess without bleeding: Secondary | ICD-10-CM | POA: Diagnosis not present

## 2023-08-24 MED ORDER — IOHEXOL 300 MG/ML  SOLN
100.0000 mL | Freq: Once | INTRAMUSCULAR | Status: AC | PRN
  Administered 2023-08-24: 100 mL via INTRAVENOUS

## 2023-08-24 MED ORDER — EPINEPHRINE 0.3 MG/0.3ML IJ SOAJ
INTRAMUSCULAR | 5 refills | Status: AC
  Filled 2023-08-24: qty 2, 2d supply, fill #0
  Filled 2023-11-16: qty 2, 2d supply, fill #1
  Filled 2024-05-09: qty 2, 2d supply, fill #2
  Filled 2024-07-26: qty 2, 2d supply, fill #3

## 2023-08-25 ENCOUNTER — Other Ambulatory Visit (HOSPITAL_COMMUNITY): Payer: Self-pay

## 2023-08-30 ENCOUNTER — Ambulatory Visit
Admission: RE | Admit: 2023-08-30 | Discharge: 2023-08-30 | Disposition: A | Discharge status: Home / Self Care | Payer: Commercial Managed Care - PPO | Source: Ambulatory Visit | Attending: Family Medicine | Admitting: Family Medicine

## 2023-08-30 ENCOUNTER — Other Ambulatory Visit (HOSPITAL_COMMUNITY): Payer: Self-pay | Admitting: Family Medicine

## 2023-08-30 DIAGNOSIS — N63 Unspecified lump in unspecified breast: Secondary | ICD-10-CM

## 2023-08-30 DIAGNOSIS — N6320 Unspecified lump in the left breast, unspecified quadrant: Secondary | ICD-10-CM

## 2023-09-09 DIAGNOSIS — K5000 Crohn's disease of small intestine without complications: Secondary | ICD-10-CM | POA: Diagnosis not present

## 2023-09-10 ENCOUNTER — Encounter (INDEPENDENT_AMBULATORY_CARE_PROVIDER_SITE_OTHER): Payer: Self-pay

## 2023-09-10 ENCOUNTER — Telehealth (INDEPENDENT_AMBULATORY_CARE_PROVIDER_SITE_OTHER): Payer: Self-pay

## 2023-09-10 NOTE — Telephone Encounter (Signed)
This message was sent via FAXCOM, a product from [employer]. [email or web address]/                    -------Fax Transmission Report-------  To:               Recipient at [phone] Subject:          New Orders for  [name] Result:           The transmission was successful. Explanation:      All Pages Ok Pages Sent:       17 Connect Time:     14 minutes, 36 seconds Transmit Time:    09/10/2023 07:54 Transfer Rate:    14400 Status Code:      0000 Retry Count:      0 Job Id:           [id number] Unique Id:        [id number] Fax Line:         20 Fax Server:       [id number]    Patient called 09/09/2023, needs new orders for [facility] sent to [facility]. New orders faxed to [facility].

## 2023-10-04 ENCOUNTER — Other Ambulatory Visit (INDEPENDENT_AMBULATORY_CARE_PROVIDER_SITE_OTHER): Payer: Self-pay | Admitting: *Deleted

## 2023-10-04 ENCOUNTER — Telehealth (INDEPENDENT_AMBULATORY_CARE_PROVIDER_SITE_OTHER): Payer: Self-pay | Admitting: *Deleted

## 2023-10-04 NOTE — Telephone Encounter (Signed)
Fax from [facility] needing order signed to continue entyvio. Form on Dr. Wilburt Finlay desk to sign when he returns to office.   Dewayne Hatch, can you print me office visit note from 12/24/22 and CT scan from 12/28/22 to send with order form. Thanks.

## 2023-10-04 NOTE — Telephone Encounter (Signed)
Reports faxed

## 2023-10-05 NOTE — Telephone Encounter (Signed)
Thanks, will sign forms

## 2023-10-07 NOTE — Telephone Encounter (Signed)
Form with notes faxed

## 2023-10-28 ENCOUNTER — Ambulatory Visit (INDEPENDENT_AMBULATORY_CARE_PROVIDER_SITE_OTHER): Payer: Commercial Managed Care - PPO | Admitting: Otolaryngology

## 2023-11-04 DIAGNOSIS — K5000 Crohn's disease of small intestine without complications: Secondary | ICD-10-CM | POA: Diagnosis not present

## 2023-11-12 ENCOUNTER — Other Ambulatory Visit (HOSPITAL_COMMUNITY): Payer: Self-pay

## 2023-11-12 DIAGNOSIS — D511 Vitamin B12 deficiency anemia due to selective vitamin B12 malabsorption with proteinuria: Secondary | ICD-10-CM | POA: Diagnosis not present

## 2023-11-12 MED ORDER — WEGOVY 0.25 MG/0.5ML ~~LOC~~ SOAJ
0.2500 mg | SUBCUTANEOUS | 2 refills | Status: DC
  Filled 2023-11-12 – 2023-11-16 (×2): qty 2, 28d supply, fill #0

## 2023-11-16 ENCOUNTER — Other Ambulatory Visit (HOSPITAL_COMMUNITY): Payer: Self-pay

## 2023-11-16 ENCOUNTER — Other Ambulatory Visit: Payer: Self-pay

## 2023-11-17 ENCOUNTER — Other Ambulatory Visit: Payer: Self-pay

## 2023-11-17 ENCOUNTER — Other Ambulatory Visit (HOSPITAL_COMMUNITY): Payer: Self-pay

## 2023-11-18 ENCOUNTER — Other Ambulatory Visit (HOSPITAL_COMMUNITY): Payer: Self-pay

## 2023-11-18 ENCOUNTER — Other Ambulatory Visit: Payer: Self-pay

## 2023-11-24 ENCOUNTER — Other Ambulatory Visit: Payer: Self-pay

## 2023-11-24 ENCOUNTER — Other Ambulatory Visit (HOSPITAL_COMMUNITY): Payer: Self-pay

## 2023-12-03 ENCOUNTER — Telehealth (INDEPENDENT_AMBULATORY_CARE_PROVIDER_SITE_OTHER): Payer: Self-pay | Admitting: *Deleted

## 2023-12-03 NOTE — Telephone Encounter (Signed)
 Spoke with patient and appt was made for next week so we can send ov and labs to [facility] for prior auth for entyvio.

## 2023-12-03 NOTE — Telephone Encounter (Signed)
 Fax from [facility] needing recent ov notes and labs. Last labs from GI are over one year ago 09/24/22. Patient last seen 12/24/22.   I called and left message to return call to let patient know she needs office visit.

## 2023-12-03 NOTE — Telephone Encounter (Signed)
 [name], this patient has appt with you on Tuesday. Her next infusion for entyvio is end of march and [facility] needs notes to do prior auth before next infusion and no available appts here before then. I will send the notes and lab results to [facility] after you see the patient. I have the form at my desk.

## 2023-12-07 ENCOUNTER — Ambulatory Visit (INDEPENDENT_AMBULATORY_CARE_PROVIDER_SITE_OTHER): Payer: Commercial Managed Care - PPO | Admitting: Gastroenterology

## 2023-12-07 ENCOUNTER — Encounter: Payer: Self-pay | Admitting: Gastroenterology

## 2023-12-07 ENCOUNTER — Encounter: Payer: Self-pay | Admitting: *Deleted

## 2023-12-07 ENCOUNTER — Telehealth: Payer: Self-pay | Admitting: *Deleted

## 2023-12-07 VITALS — BP 128/87 | HR 78 | Temp 97.8°F | Ht 71.5 in | Wt 264.8 lb

## 2023-12-07 DIAGNOSIS — K50018 Crohn's disease of small intestine with other complication: Secondary | ICD-10-CM

## 2023-12-07 NOTE — Progress Notes (Signed)
 Gastroenterology Office Note     Primary Care Physician:  Assunta Found, MD  Primary Gastroenterologist: Dr. Levon Hedger    Chief Complaint   Chief Complaint  Patient presents with   Follow-up    Follow up with labs being drawn to continue infusions     History of Present Illness   [name] is a 58 y.o. female presenting today with a history of asthma, small bowel Crohn's disease diagnosed, GERD, hypertension, obesity, diverticulosis and multiple episodes of presumed diverticulitis treated by PCP empirically. She is here to update labs prior to next Entyvio infusion (q 8 weeks). She has been on Entyvio for 2 years, and prior to this was treated with intermittent budesonide. Regarding Crohn's history, colonoscopy in 2011 with pathy chronic active ileitis with surface erosion, and 2020 mild active inflammation of TI but without chronicity. Differentials mild IBD vs NSAID changes; however, patient had been avoiding NSAIDs. Small bowel endoscopy unrevealing. Treated for Crohn's.   Feels like might be going through a diverticulitis episode the last few weeks. Feels like it is getting worse. Having lower right lower abdomen pressure, similar to prior presentations. Wants to avoid CT at this time. Not documented diverticulitis recently. No rectal bleeding. No unexplained weight loss or lack of appetite. No NSAIDs. Omeprazole 40 mg daily for GERD. No dysphagia.   Goes one way or the other with bowel movements. Alternating constipation with diarrhea. Grainy bowel movements currently. When Crohn's acts up it affects on the right side. No fever.   Magnesium has helped with bowel regularity in the past.   FH of RA. Went to Rheumatologist and has joint pain.   Last CT Nov 2024:  Colonic diverticulosis, moderate volume of formed stool, left renal stone  Last Colonoscopy:03/25/22- One 4 mm polyp in the cecum, removed with a cold snare.-tubular adenoma  - Melanosis in the colon. -  Diverticulosis in the entire examined colon. - The distal rectum and anal verge are normal on retroflexion view. Last Endoscopy:03/25/22- 3 cm hiatal hernia. - Non-obstructing Schatzki ring. - Normal stomach. - Normal examined Normal examined duodenum, biopsied-mild inflammation   Recommendations:  Repeat colon in 5 years     Past Medical History:  Diagnosis Date   Anxiety    Arthritis    Asthma    Cervical hyperplasia    hisotry of   Chest pain    cath 2005 norm cors, repeat cath Feb 2011 normal cors and only minimally  elevated pulmonary pressures   Crohn disease (HCC)    Diverticulitis of colon 07/17/2014   Diverticulosis    Dyspnea    GERD (gastroesophageal reflux disease)    Hiatal hernia    History of iron deficiency anemia    Hypertension    Idiopathic intracranial hypertension    Per patient   Migraines    Obesity    Palpitations    Pneumonia 2008   14 days hospitalization, bilateral   PONV (postoperative nausea and vomiting)    Sleep apnea sleep study 11/03/2009    cpap; does sometimes, doesnt use every night. weight loss no longer using CPAP   Vitamin B deficiency     Past Surgical History:  Procedure Laterality Date   ABDOMINAL HYSTERECTOMY     ANKLE ARTHROSCOPY  06/01/2012   Procedure: ANKLE ARTHROSCOPY;  Surgeon: Sherri Rad, MD;  Location: [facility];  Service: Orthopedics;  Laterality: Left;  left ankle arthorsocpy with extensive debridement and gastroc slide  ANKLE SURGERY Left 05/11/2018   BIOPSY  04/26/2019   Procedure: BIOPSY;  Surgeon: Malissa Hippo, MD;  [facility]: [facility];  Service: Endoscopy;;  ileum colon   BIOPSY  03/25/2022   Procedure: BIOPSY;  Surgeon: Dolores Frame, MD;  Location: [facility];  Service: Gastroenterology;;   CARDIAC CATHETERIZATION  11/22/2009 and 2005   WNL   CESAREAN SECTION     CHOLECYSTECTOMY  09/08/2006   lap. chole.   CHONDROPLASTY  06/17/2012   Procedure:  CHONDROPLASTY;  Surgeon: Vickki Hearing, MD;  Location: [facility];  Service: Orthopedics;  Laterality: Right;  right patella   COLONOSCOPY N/A 04/26/2019   Rehman: a few erosions in ternimal ileum, two erosions in cecum (assocaited with acute inflammation) diverticulosis in sigmoid hepatic flexure and ascending colon, external hemorrhoids   COLONOSCOPY WITH PROPOFOL N/A 03/25/2022   Procedure: COLONOSCOPY WITH PROPOFOL;  Surgeon: Dolores Frame, MD;  Location: [facility];  Service: Gastroenterology;  Laterality: N/A;  830 ASA 1   ESOPHAGOGASTRODUODENOSCOPY N/A 05/14/2015   Procedure: ESOPHAGOGASTRODUODENOSCOPY (EGD);  Surgeon: Malissa Hippo, MD;  [facility];  Service: Endoscopy;  Laterality: N/A;  730   ESOPHAGOGASTRODUODENOSCOPY (EGD) WITH PROPOFOL N/A 03/25/2022   Procedure: ESOPHAGOGASTRODUODENOSCOPY (EGD) WITH PROPOFOL;  Surgeon: Dolores Frame, MD;  [facility]: [facility];  Service: Gastroenterology;  Laterality: N/A;   GIVENS CAPSULE STUDY N/A 05/24/2019   Procedure: GIVENS CAPSULE STUDY;  Surgeon: Malissa Hippo, MD;  [facility]: [facility];  Service: Endoscopy;  Laterality: N/A;  7:30   INGUINAL HERNIA REPAIR  10/30/2008   right   KNEE ARTHROSCOPY Right 2013   KNEE ARTHROSCOPY WITH LATERAL MENISECTOMY Left 12/23/2016   Procedure: LEFT KNEE ARTHROSCOPY WITH LATERAL MENISECTOMY;  Surgeon: Vickki Hearing, MD;  Location: [facility];  Service: Orthopedics;  Laterality: Left;   KNEE ARTHROSCOPY WITH MEDIAL MENISECTOMY Left 12/23/2016   Procedure: LEFT KNEE ARTHROSCOPY WITH MEDIAL MENISECTOMY CHONDROPLASTY PATELLA  AND MEDIAL FEMORAL CONDYLE LEFT KNEE;  Surgeon: Vickki Hearing, MD;  Location: [facility];  Service: Orthopedics;  Laterality: Left;   POLYPECTOMY  03/25/2022   Procedure: POLYPECTOMY;  Surgeon: Dolores Frame, MD;  [facility];  Service: Gastroenterology;;   SHOULDER ARTHROSCOPY WITH SUBACROMIAL DECOMPRESSION AND BICEP  TENDON REPAIR Left 12/21/2017   Procedure: Left shoulder arthroscopic biceps tenodesis, SAD, DCR and labrum debridement;  Surgeon: Yolonda Kida, MD;  Location: [facility];  Service: Orthopedics;  Laterality: Left;  120 mins   SHOULDER SURGERY     right x 2    TOTAL KNEE ARTHROPLASTY Left 06/06/2019   Procedure: TOTAL KNEE ARTHROPLASTY;  Surgeon: Durene Romans, MD;  Location: [facility];  Service: Orthopedics;  Laterality: Left;  70 mins   TOTAL KNEE ARTHROPLASTY Right 01/23/2021   Procedure: TOTAL KNEE ARTHROPLASTY;  Surgeon: Durene Romans, MD;  Location: [facility];  Service: Orthopedics;  Laterality: Right;  70 mins    Current Outpatient Medications  Medication Sig Dispense Refill   acetaminophen (TYLENOL) 500 MG tablet Take 2 tablets (1,000 mg total) by mouth every 8 (eight) hours. (Patient taking differently: Take 1,000 mg by mouth every 8 (eight) hours as needed for moderate pain (pain score 4-6).) 30 tablet 0   acetaZOLAMIDE ER (DIAMOX) 500 MG capsule Take 1 capsule (500 mg total) by mouth every morning AND 2 capsules (1,000 mg total) every evening. 270 capsule 3   albuterol (PROVENTIL) (2.5 MG/3ML) 0.083% nebulizer solution Inhale the contents of 1 vial  via nebulizer every 6 hours as needed 90 mL 1   albuterol (VENTOLIN HFA) 108 (90 Base) MCG/ACT inhaler Inhale 1-2 puffs by mouth every 4 hours as needed 6.7 g 9   amitriptyline (ELAVIL) 25 MG tablet TAKE 1 TABLET BY MOUTH AT BEDTIME 90 tablet 3   amLODipine (NORVASC) 5 MG tablet Take 1 tablet (5 mg total) by mouth daily. 90 tablet 2   Cyanocobalamin (VITAMIN B-12 IJ) Inject 1 Dose as directed every 30 (thirty) days.     cyclobenzaprine (FLEXERIL) 5 MG tablet Take 1 tablet (5 mg total) by mouth every 8 (eight) hours as needed for muscle spasms. 40 tablet 2   diclofenac Sodium (VOLTAREN) 1 % GEL Apply 1 Application topically 4 (four) times daily as needed (pain).     diphenhydrAMINE (BENADRYL) 25 MG tablet Take 25-50 mg by mouth every 6 (six)  hours as needed for allergies.     EPINEPHrine (EPIPEN 2-PAK) 0.3 mg/0.3 mL IJ SOAJ injection Inject as directed for anaphylaxis. 2 each 5   famotidine (PEPCID) 40 MG tablet Take 1 tablet by mouth at bedtime. Needs office visit. 90 tablet 3   fluticasone (FLONASE) 50 MCG/ACT nasal spray Place 2 sprays into both nostrils daily. 16 g 6   Fremanezumab-vfrm (AJOVY) 225 MG/1.5ML SOAJ Inject 225 mg into the skin every 30 (thirty) days. 4.5 mL 3   hyoscyamine (LEVSIN SL) 0.125 MG SL tablet Dissolve 1 tablet under the tongue 3 times daily before meals. 90 tablet 1   loratadine (CLARITIN) 10 MG tablet Take 10 mg by mouth at bedtime.     losartan (COZAAR) 100 MG tablet Take 1 tablet (100 mg total) by mouth daily. 90 tablet 1   meclizine (ANTIVERT) 25 MG tablet Take 25 mg by mouth 3 (three) times daily as needed for dizziness.     montelukast (SINGULAIR) 10 MG tablet Take 1 tablet (10 mg total) by mouth daily. 90 tablet 3   omeprazole (PRILOSEC) 40 MG capsule Take 1 capsule (40 mg total) by mouth daily. 90 capsule 3   ondansetron (ZOFRAN-ODT) 4 MG disintegrating tablet Dissolve 1 tablet by mouth every 8 hours as needed for nausea or vomiting. 90 tablet 1   polyethylene glycol (MIRALAX / GLYCOLAX) 17 g packet Take 17 g by mouth daily.     rizatriptan (MAXALT-MLT) 10 MG disintegrating tablet Dissolve 1 tablet (10 mg total) by mouth as needed for migraine. May repeat in 2 hours if needed 9 tablet 11   tamsulosin (FLOMAX) 0.4 MG CAPS capsule Take 1 capsule (0.4 mg total) by mouth daily. (Patient taking differently: Take 0.4 mg by mouth daily as needed (kidney stones).) 30 capsule 2   vedolizumab (ENTYVIO) 300 MG injection Inject 300 mg into the vein every 8 (eight) weeks.     No current facility-administered medications for this visit.    Allergies as of 12/07/2023 - Review Complete 12/07/2023  Allergen Reaction Noted   Bee pollen Anaphylaxis 05/28/2012   Hydromorphone hcl  12/02/2022   Iodinated contrast  media Anaphylaxis, Hives, and Itching 12/02/2022   Other Shortness Of Breath 05/28/2012   Wasp venom Anaphylaxis and Shortness Of Breath 08/06/2011   Pollen extract Swelling 05/28/2012   Adhesive [tape] Rash 06/03/2012   Omnipaque [iohexol] Hives and Nausea Only 01/02/2011    Family History  Problem Relation Age of Onset   Hypertension Mother    Atrial fibrillation Mother    Arthritis Mother    Lupus Father    COPD Father  Rheum arthritis Father    Sarcoidosis Father    Lupus Sister    Congestive Heart Failure Maternal Grandmother    Migraines Neg Hx    Headache Neg Hx     Social History   Socioeconomic History   Marital status: Married    Spouse name: Not on file   Number of children: 4   Years of education: Not on file   Highest education level: Not on file  Occupational History    Employer:     Comment: corporate safety  Tobacco Use   Smoking status: Never    Passive exposure: Never   Smokeless tobacco: Never  Vaping Use   Vaping status: Never Used  Substance and Sexual Activity   Alcohol use: No    Alcohol/week: 0.0 standard drinks of alcohol   Drug use: Never   Sexual activity: Yes    Birth control/protection: Surgical  Other Topics Concern   Not on file  Social History Narrative   Lives with husband and oldest son    Caffeine use: about 3 cups of coffee/day   She works in Geophysicist/field seismologist with [employer]   She has 4 children age 15-22.     Social Drivers of Corporate investment banker Strain: Not on file  Food Insecurity: Not on file  Transportation Needs: Not on file  Physical Activity: Not on file  Stress: Not on file  Social Connections: Not on file  Intimate Partner Violence: Not on file     Review of Systems   Gen: Denies any fever, chills, fatigue, weight loss, lack of appetite.  CV: Denies chest pain, heart palpitations, peripheral edema, syncope.  Resp: Denies shortness of breath at rest or with exertion. Denies  wheezing or cough.  GI: Denies dysphagia or odynophagia. Denies jaundice, hematemesis, fecal incontinence. GU : Denies urinary burning, urinary frequency, urinary hesitancy MS: Denies joint pain, muscle weakness, cramps, or limitation of movement.  Derm: Denies rash, itching, dry skin Psych: Denies depression, anxiety, memory loss, and confusion Heme: Denies bruising, bleeding, and enlarged lymph nodes.   Physical Exam   BP 128/87   Pulse 78   Temp 97.8 F (36.6 C)   Ht 5' 11.5" (1.816 m)   Wt 264 lb 12.8 oz (120.1 kg)   BMI 36.42 kg/m  General:   Alert and oriented. Pleasant and cooperative. Well-nourished and well-developed.  Head:  Normocephalic and atraumatic. Eyes:  Without icterus Abdomen:  +BS, soft, non-tender and non-distended. No HSM noted. No guarding or rebound. No masses appreciated.  Rectal:  Deferred  Msk:  Symmetrical without gross deformities. Normal posture. Extremities:  Without edema. Neurologic:  Alert and  oriented x4;  grossly normal neurologically. Skin:  Intact without significant lesions or rashes. Psych:  Alert and cooperative. Normal mood and affect.   Assessment  58 y.o. female presenting today with a history of asthma, small bowel Crohn's disease and starting Entyvio in 2020, GERD, hypertension, obesity, diverticulosis and multiple episodes of presumed diverticulitis treated by PCP empirically, presenting today for a visit prior to continued Entyvio infusions with need for labs.   Small bowel Crohn's:  currently on Entyvio for maintenance every 8 weeks. Prior to this intermittent courses of budesonide. She has had intermittent bouts of RLQ discomfort, and she notes her PCP has treated her empirically for presumed diverticulitis but do not have CT documented imaging for this. She is declining any CT today currently and does not appear acutely ill. Colonoscopy in 2023 unable  to intubate TI due to redundancy of colon, and 2020 colonoscopy with mild  inflammation of TI but without chronicity. Last trough and antibody levels in 2023. I suspect constipation is contributing to her symptoms but also query if she is in actual remission due to intermittent pain. Will update labs, check trough and antibody levels just prior to next dose, pursue MRE (last in 2023). Next colonoscopy due to adenoma is in 2028. Interestingly, she is also seeing Rheumatology due to joint pain and has family history of RA. Unable to rule out EIM of IBD at this time.     PLAN    Trough and antibodies just prior to infusion Labs today MRE enterography in future 2-3 month follow-up Continue avoidance of NSAIDs indefinitely    Gelene Mink, PhD, ANP-BC [facility] Gastroenterology   I have reviewed the note and agree with the APP's assessment as described in this progress note  Katrinka Blazing, MD Gastroenterology and Hepatology [facility]

## 2023-12-07 NOTE — Telephone Encounter (Signed)
 Oh that's wonderful, Toniann Fail! Thank you!

## 2023-12-07 NOTE — Telephone Encounter (Signed)
 Order on your desk for you to write in what labs you want drawn prior to  infusion. Infusion date  12/30/23

## 2023-12-07 NOTE — Telephone Encounter (Signed)
 Per [employer]  Case Number: [id number] Review Date: 12/07/2023 9:01:06 AM Expiration Date: N/A Status: This member does not require prior authorization for this procedure at this time. The member's employer group has opted out for the requested procedure, making them non-delegated through [employer].

## 2023-12-07 NOTE — Telephone Encounter (Signed)
 Patient seen today and her next infusion at [facility] is 12/30/23 at 3pm.  I called [facility] and they can do labs the day of infusion and they will fax me the form to fill out for the lab orders. I will let you know when I receive it.

## 2023-12-07 NOTE — Patient Instructions (Signed)
 Please have blood work done so we can send to the infusion center.  We will contact the infusion center to have blood work done just prior to your next dose to check the trough levels and antibodies.   We are arranging a special MRI of your intestines.  We may need to change therapy, but we can discuss that after labs!  Will see you in 2-3 months!   Please message with any concerns!  It was a pleasure to see you today. I want to create trusting relationships with patients and provide genuine, compassionate, and quality care. I truly value your feedback, so please be on the lookout for a survey regarding your visit with me today. I appreciate your time in completing this!         Gelene Mink, PhD, ANP-BC [facility] Gastroenterology

## 2023-12-09 ENCOUNTER — Other Ambulatory Visit: Payer: Self-pay | Admitting: Gastroenterology

## 2023-12-09 DIAGNOSIS — K50018 Crohn's disease of small intestine with other complication: Secondary | ICD-10-CM

## 2023-12-11 LAB — QUANTIFERON-TB GOLD PLUS
QuantiFERON Mitogen Value: >10 [IU]/mL
QuantiFERON Nil Value: 0.04 [IU]/mL
QuantiFERON TB1 Ag Value: 0.04 [IU]/mL
QuantiFERON TB2 Ag Value: 0.06 [IU]/mL
QuantiFERON-TB Gold Plus: NEGATIVE

## 2023-12-11 LAB — CBC WITH DIFFERENTIAL/PLATELET
Basophils Absolute: 0 10*3/uL (ref 0.0–0.2)
Basos: 1 %
EOS (ABSOLUTE): 0.2 10*3/uL (ref 0.0–0.4)
Eos: 3 %
Hematocrit: 43.1 % (ref 34.0–46.6)
Hemoglobin: 14.2 g/dL (ref 11.1–15.9)
Immature Grans (Abs): 0 10*3/uL (ref 0.0–0.1)
Immature Granulocytes: 0 %
Lymphocytes Absolute: 2.4 10*3/uL (ref 0.7–3.1)
Lymphs: 40 %
MCH: 28.8 pg (ref 26.6–33.0)
MCHC: 32.9 g/dL (ref 31.5–35.7)
MCV: 87 fL (ref 79–97)
Monocytes Absolute: 0.5 10*3/uL (ref 0.1–0.9)
Monocytes: 8 %
Neutrophils Absolute: 2.9 10*3/uL (ref 1.4–7.0)
Neutrophils: 48 %
Platelets: 362 10*3/uL (ref 150–450)
RBC: 4.93 x10E6/uL (ref 3.77–5.28)
RDW: 12.9 % (ref 11.7–15.4)
WBC: 6.1 10*3/uL (ref 3.4–10.8)

## 2023-12-11 LAB — COMPREHENSIVE METABOLIC PANEL
ALT: 13 IU/L (ref 0–32)
AST: 14 IU/L (ref 0–40)
Albumin: 4.6 g/dL (ref 3.8–4.9)
Alkaline Phosphatase: 72 IU/L (ref 44–121)
BUN/Creatinine Ratio: 11 (ref 9–23)
BUN: 12 mg/dL (ref 6–24)
Bilirubin Total: 0.4 mg/dL (ref 0.0–1.2)
CO2: 22 mmol/L (ref 20–29)
Calcium: 9.7 mg/dL (ref 8.7–10.2)
Chloride: 106 mmol/L (ref 96–106)
Creatinine, Ser: 1.06 mg/dL — ABNORMAL HIGH (ref 0.57–1.00)
Globulin, Total: 2.7 g/dL (ref 1.5–4.5)
Glucose: 114 mg/dL — ABNORMAL HIGH (ref 70–99)
Potassium: 4.2 mmol/L (ref 3.5–5.2)
Sodium: 141 mmol/L (ref 134–144)
Total Protein: 7.3 g/dL (ref 6.0–8.5)
eGFR: 62 mL/min/{1.73_m2} (ref >59)

## 2023-12-11 LAB — C-REACTIVE PROTEIN: CRP: 3 mg/L (ref 0–10)

## 2023-12-11 LAB — HEPATITIS B SURFACE ANTIGEN: Hepatitis B Surface Ag: NEGATIVE

## 2023-12-11 LAB — SEDIMENTATION RATE: Sed Rate: 4 mm/h (ref 0–40)

## 2023-12-13 ENCOUNTER — Ambulatory Visit (HOSPITAL_COMMUNITY)
Admission: RE | Admit: 2023-12-13 | Discharge: 2023-12-13 | Disposition: A | Discharge status: Home / Self Care | Source: Ambulatory Visit | Attending: Gastroenterology | Admitting: Gastroenterology

## 2023-12-13 DIAGNOSIS — K449 Diaphragmatic hernia without obstruction or gangrene: Secondary | ICD-10-CM | POA: Diagnosis not present

## 2023-12-13 DIAGNOSIS — Z9049 Acquired absence of other specified parts of digestive tract: Secondary | ICD-10-CM | POA: Diagnosis not present

## 2023-12-13 DIAGNOSIS — K50018 Crohn's disease of small intestine with other complication: Secondary | ICD-10-CM | POA: Diagnosis not present

## 2023-12-13 DIAGNOSIS — K5090 Crohn's disease, unspecified, without complications: Secondary | ICD-10-CM | POA: Diagnosis not present

## 2023-12-13 DIAGNOSIS — K5730 Diverticulosis of large intestine without perforation or abscess without bleeding: Secondary | ICD-10-CM | POA: Diagnosis not present

## 2023-12-13 MED ORDER — GLUCAGON HCL RDNA (DIAGNOSTIC) 1 MG IJ SOLR
1.0000 mg | Freq: Once | INTRAMUSCULAR | Status: AC
  Administered 2023-12-13: 1 mg via INTRAVENOUS

## 2023-12-13 MED ORDER — GADOBUTROL 1 MMOL/ML IV SOLN
10.0000 mL | Freq: Once | INTRAVENOUS | Status: AC | PRN
Start: 2023-12-13 — End: 2023-12-13
  Administered 2023-12-13: 10 mL via INTRAVENOUS

## 2023-12-13 MED ORDER — GLUCAGON HCL RDNA (DIAGNOSTIC) 1 MG IJ SOLR
INTRAMUSCULAR | Status: AC
Start: 2023-12-13 — End: ?
  Filled 2023-12-13: qty 1

## 2023-12-14 NOTE — Telephone Encounter (Signed)
 Ov note and lab results faxed to infusion clinic that was requested for prior auth.

## 2023-12-15 DIAGNOSIS — R768 Other specified abnormal immunological findings in serum: Secondary | ICD-10-CM | POA: Diagnosis not present

## 2023-12-15 DIAGNOSIS — K5080 Crohn's disease of both small and large intestine without complications: Secondary | ICD-10-CM | POA: Diagnosis not present

## 2023-12-15 DIAGNOSIS — M2559 Pain in other specified joint: Secondary | ICD-10-CM | POA: Diagnosis not present

## 2023-12-15 DIAGNOSIS — E669 Obesity, unspecified: Secondary | ICD-10-CM | POA: Diagnosis not present

## 2023-12-15 DIAGNOSIS — M170 Bilateral primary osteoarthritis of knee: Secondary | ICD-10-CM | POA: Diagnosis not present

## 2023-12-15 DIAGNOSIS — Z6837 Body mass index (BMI) 37.0-37.9, adult: Secondary | ICD-10-CM | POA: Diagnosis not present

## 2023-12-16 ENCOUNTER — Other Ambulatory Visit: Payer: Self-pay

## 2023-12-17 NOTE — Telephone Encounter (Signed)
 Holding this message until I get the order from [facility] to fax for the infusion clinic to do labs the day of infusion ( before the infusion ) she gets infusion on 12/30/23

## 2023-12-23 NOTE — Telephone Encounter (Signed)
 Order faxed to [facility] and sent to be scanned.

## 2023-12-28 ENCOUNTER — Other Ambulatory Visit (HOSPITAL_COMMUNITY): Payer: Self-pay

## 2023-12-30 DIAGNOSIS — K5000 Crohn's disease of small intestine without complications: Secondary | ICD-10-CM | POA: Diagnosis not present

## 2024-01-07 DIAGNOSIS — D511 Vitamin B12 deficiency anemia due to selective vitamin B12 malabsorption with proteinuria: Secondary | ICD-10-CM | POA: Diagnosis not present

## 2024-02-08 ENCOUNTER — Emergency Department (HOSPITAL_COMMUNITY)

## 2024-02-08 ENCOUNTER — Encounter (HOSPITAL_COMMUNITY): Payer: Self-pay

## 2024-02-08 ENCOUNTER — Emergency Department (HOSPITAL_COMMUNITY)
Admission: EM | Admit: 2024-02-08 | Discharge: 2024-02-08 | Disposition: A | Discharge status: Home / Self Care | Attending: Emergency Medicine | Admitting: Emergency Medicine

## 2024-02-08 ENCOUNTER — Other Ambulatory Visit: Payer: Self-pay

## 2024-02-08 DIAGNOSIS — Z79899 Other long term (current) drug therapy: Secondary | ICD-10-CM | POA: Insufficient documentation

## 2024-02-08 DIAGNOSIS — R0781 Pleurodynia: Secondary | ICD-10-CM | POA: Insufficient documentation

## 2024-02-08 DIAGNOSIS — R079 Chest pain, unspecified: Secondary | ICD-10-CM | POA: Diagnosis not present

## 2024-02-08 DIAGNOSIS — I10 Essential (primary) hypertension: Secondary | ICD-10-CM | POA: Diagnosis not present

## 2024-02-08 DIAGNOSIS — R0789 Other chest pain: Secondary | ICD-10-CM | POA: Diagnosis not present

## 2024-02-08 DIAGNOSIS — R600 Localized edema: Secondary | ICD-10-CM | POA: Diagnosis not present

## 2024-02-08 DIAGNOSIS — Z7951 Long term (current) use of inhaled steroids: Secondary | ICD-10-CM | POA: Diagnosis not present

## 2024-02-08 DIAGNOSIS — J45909 Unspecified asthma, uncomplicated: Secondary | ICD-10-CM | POA: Diagnosis not present

## 2024-02-08 DIAGNOSIS — Z96653 Presence of artificial knee joint, bilateral: Secondary | ICD-10-CM | POA: Diagnosis not present

## 2024-02-08 DIAGNOSIS — M549 Dorsalgia, unspecified: Secondary | ICD-10-CM | POA: Diagnosis not present

## 2024-02-08 DIAGNOSIS — R0602 Shortness of breath: Secondary | ICD-10-CM | POA: Diagnosis not present

## 2024-02-08 LAB — CBC
HCT: 41.2 % (ref 36.0–46.0)
Hemoglobin: 13.2 g/dL (ref 12.0–15.0)
MCH: 28.9 pg (ref 26.0–34.0)
MCHC: 32 g/dL (ref 30.0–36.0)
MCV: 90.4 fL (ref 80.0–100.0)
Platelets: 350 10*3/uL (ref 150–400)
RBC: 4.56 MIL/uL (ref 3.87–5.11)
RDW: 14.3 % (ref 11.5–15.5)
WBC: 6.7 10*3/uL (ref 4.0–10.5)
nRBC: 0 % (ref 0.0–0.2)

## 2024-02-08 LAB — D-DIMER, QUANTITATIVE: D-Dimer, Quant: 0.27 ug{FEU}/mL (ref 0.00–0.50)

## 2024-02-08 LAB — BASIC METABOLIC PANEL WITH GFR
Anion gap: 6 (ref 5–15)
BUN: 14 mg/dL (ref 6–20)
CO2: 23 mmol/L (ref 22–32)
Calcium: 9.2 mg/dL (ref 8.9–10.3)
Chloride: 111 mmol/L (ref 98–111)
Creatinine, Ser: 0.95 mg/dL (ref 0.44–1.00)
GFR, Estimated: >60 mL/min (ref >60)
Glucose, Bld: 129 mg/dL — ABNORMAL HIGH (ref 70–99)
Potassium: 3.2 mmol/L — ABNORMAL LOW (ref 3.5–5.1)
Sodium: 140 mmol/L (ref 135–145)

## 2024-02-08 LAB — TROPONIN I (HIGH SENSITIVITY)
Troponin I (High Sensitivity): 3 ng/L (ref <18)
Troponin I (High Sensitivity): 4 ng/L (ref <18)

## 2024-02-08 MED ORDER — KETOROLAC TROMETHAMINE 15 MG/ML IJ SOLN
15.0000 mg | Freq: Once | INTRAMUSCULAR | Status: AC
  Administered 2024-02-08: 15 mg via INTRAVENOUS
  Filled 2024-02-08: qty 1

## 2024-02-08 MED ORDER — POTASSIUM CHLORIDE 20 MEQ PO PACK
40.0000 meq | PACK | Freq: Once | ORAL | Status: AC
Start: 2024-02-08 — End: 2024-02-08
  Administered 2024-02-08: 40 meq via ORAL
  Filled 2024-02-08: qty 2

## 2024-02-08 MED ORDER — ACETAMINOPHEN 500 MG PO TABS: 1000.0000 mg | ORAL_TABLET | Freq: Once | ORAL | Status: DC

## 2024-02-08 NOTE — ED Triage Notes (Signed)
 Chest pain in middle of chest that started today at 1430. Pain radiates into back. Pt states she has felt heart racing today on and off. Pt is sob in triage.

## 2024-02-08 NOTE — Discharge Instructions (Addendum)
 We evaluated you for your chest pain.  Your testing in the emergency department was reassuring.  Your cardiac testing was negative.  We also evaluated you for any signs of a blood clot and this test was also negative.  We feel that it is safe to go home.  We have placed a referral for you to follow-up with Dr. Stann Earnest.  Please also follow-up with your primary doctor. Please take Tylenol  (acetaminophen ) and Motrin  (ibuprofen ) for your symptoms at home.  You can take 1000 mg of Tylenol  every 6 hours and 600 mg of Motrin  every 6 hours as needed for your symptoms.  You can take these medicines together as needed, either at the same time, or alternating every 3 hours.  If you do develop any new or worsening symptoms such as severe pain, difficulty breathing, fainting, worsening symptoms, or any other concerning symptoms, please return to the emergency department for reassessment.

## 2024-02-08 NOTE — ED Provider Notes (Signed)
 [facility] EMERGENCY DEPARTMENT AT [facility] Provider Note  CSN: [id number] Arrival date & time: 02/08/24 1759  Chief Complaint(s) Chest Pain  HPI [name] is a 58 y.o. female history of Crohn's disease, hypertension presenting to the emergency department with chest pain.  Patient reports chest pain started this afternoon around 230.  Reports that she has also had some palpitations and racing heartbeat.  Pain located in the center of the chest.  Does have some pain in the back.  Pain is pleuritic.  No recent travel or surgeries.  Did have recent ankle injury and having some pain in the left ankle and leg, though it has been improving.  No cough, runny nose, sore throat.  No pain in the arms or legs.  No abdominal pain.  Pain was gradual onset, not sudden onset or tearing in nature.   Past Medical History Past Medical History:  Diagnosis Date   Anxiety    Arthritis    Asthma    Cervical hyperplasia    hisotry of   Chest pain    cath 2005 norm cors, repeat cath Feb 2011 normal cors and only minimally  elevated pulmonary pressures   Crohn disease (HCC)    Diverticulitis of colon 07/17/2014   Diverticulosis    Dyspnea    GERD (gastroesophageal reflux disease)    Hiatal hernia    History of iron deficiency anemia    Hypertension    Idiopathic intracranial hypertension    Per patient   Migraines    Obesity    Palpitations    Pneumonia 2008   14 days hospitalization, bilateral   PONV (postoperative nausea and vomiting)    Sleep apnea sleep study 11/03/2009    cpap; does sometimes, doesnt use every night. weight loss no longer using CPAP   Vitamin B deficiency    Patient Active Problem List   Diagnosis Date Noted   Hematuria 12/27/2022   Right flank pain 12/27/2022   Change in stool caliber 04/29/2022   RUQ pain 04/29/2022   Right lower quadrant abdominal pain 04/27/2022   Change in bowel movement 01/26/2022   GERD (gastroesophageal reflux disease)     Crohn's ileitis (HCC) 07/31/2021   S/P total knee arthroplasty, right 01/23/2021   IIH (idiopathic intracranial hypertension) 10/14/2020   Discoloration of skin of finger 09/12/2020   Bilateral hand pain 09/12/2020   Paresthesia of skin 09/12/2020   Chronic migraine without aura without status migrainosus, not intractable 06/22/2020   Iron deficiency anemia 06/20/2019   S/P left TKA 06/06/2019   Status post total left knee replacement 06/06/2019   Special screening for malignant neoplasms, colon 03/28/2019   LLQ abdominal pain 09/26/2018   Derangement of anterior horn of lateral meniscus of left knee    Derangement of posterior horn of medial meniscus of left knee    Hypokalemia 04/25/2016   Hyperglycemia 04/25/2016   Diverticulitis of colon 07/17/2014   Nausea without vomiting 07/17/2014   History of arthroscopy of right knee 07/25/2012   Difficulty walking 06/29/2012   Weakness of right leg 06/29/2012   Posterior tibial tendonitis 11/11/2011   PTTD (posterior tibial tendon dysfunction) 11/11/2011   Knee pain, right 11/11/2011   Chest pain 07/16/2011   Primary osteoarthritis of left knee 02/25/2010   PAIN IN JOINT, MULTIPLE SITES 02/25/2010   Essential hypertension 08/13/2009   Diaphragmatic hernia 08/13/2009   PALPITATIONS 08/13/2009   DYSPNEA 08/13/2009   Home Medication(s) Prior to Admission medications   Medication Sig  Start Date End Date Taking? Authorizing Provider  acetaminophen  (TYLENOL ) 500 MG tablet Take 2 tablets (1,000 mg total) by mouth every 8 (eight) hours. Patient taking differently: Take 1,000 mg by mouth every 8 (eight) hours as needed for moderate pain (pain score 4-6). 06/07/19   Equilla Hastings, PA-C  acetaZOLAMIDE  ER (DIAMOX ) 500 MG capsule Take 1 capsule (500 mg total) by mouth every morning AND 2 capsules (1,000 mg total) every evening. 03/24/23   Lomax, Amy, NP  albuterol  (PROVENTIL ) (2.5 MG/3ML) 0.083% nebulizer solution Inhale the contents of 1 vial via  nebulizer every 6 hours as needed 05/05/22     albuterol  (VENTOLIN  HFA) 108 (90 Base) MCG/ACT inhaler Inhale 1-2 puffs by mouth every 4 hours as needed 06/08/23     amitriptyline  (ELAVIL ) 25 MG tablet TAKE 1 TABLET BY MOUTH AT BEDTIME 03/24/23   Lomax, Amy, NP  amLODipine  (NORVASC ) 5 MG tablet Take 1 tablet (5 mg total) by mouth daily. 06/08/23 06/07/24    Cyanocobalamin  (VITAMIN B-12 IJ) Inject 1 Dose as directed every 30 (thirty) days.    [provider]  cyclobenzaprine  (FLEXERIL ) 5 MG tablet Take 1 tablet (5 mg total) by mouth every 8 (eight) hours as needed for muscle spasms. 06/07/23     diclofenac  Sodium (VOLTAREN ) 1 % GEL Apply 1 Application topically 4 (four) times daily as needed (pain).    [provider]  diphenhydrAMINE  (BENADRYL ) 25 MG tablet Take 25-50 mg by mouth every 6 (six) hours as needed for allergies.    [provider]  EPINEPHrine  (EPIPEN  2-PAK) 0.3 mg/0.3 mL IJ SOAJ injection Inject as directed for anaphylaxis. 08/24/23     famotidine  (PEPCID ) 40 MG tablet Take 1 tablet by mouth at bedtime. Needs office visit. 01/25/23   Carlan, Chelsea L, NP  fluticasone  (FLONASE ) 50 MCG/ACT nasal spray Place 2 sprays into both nostrils daily. 06/28/23   Soldatova, Liuba, MD  Fremanezumab -vfrm (AJOVY ) 225 MG/1.5ML SOAJ Inject 225 mg into the skin every 30 (thirty) days. 03/24/23   Lomax, Amy, NP  hyoscyamine  (LEVSIN SL) 0.125 MG SL tablet Dissolve 1 tablet under the tongue 3 times daily before meals. 07/22/22   Carlan, Chelsea L, NP  loratadine  (CLARITIN ) 10 MG tablet Take 10 mg by mouth at bedtime.    [provider]  losartan  (COZAAR ) 100 MG tablet Take 1 tablet (100 mg total) by mouth daily. 08/23/23     meclizine  (ANTIVERT ) 25 MG tablet Take 25 mg by mouth 3 (three) times daily as needed for dizziness.    [provider]  montelukast  (SINGULAIR ) 10 MG tablet Take 1 tablet (10 mg total) by mouth daily. 06/09/23     omeprazole  (PRILOSEC) 40 MG capsule Take 1  capsule (40 mg total) by mouth daily. 09/24/22   Carlan, Chelsea L, NP  ondansetron  (ZOFRAN -ODT) 4 MG disintegrating tablet Dissolve 1 tablet by mouth every 8 hours as needed for nausea or vomiting. 06/09/23   Carlan, Chelsea L, NP  polyethylene glycol (MIRALAX  / GLYCOLAX ) 17 g packet Take 17 g by mouth daily.    [provider]  rizatriptan  (MAXALT -MLT) 10 MG disintegrating tablet Dissolve 1 tablet (10 mg total) by mouth as needed for migraine. May repeat in 2 hours if needed 03/24/23   Lomax, Amy, NP  tamsulosin  (FLOMAX ) 0.4 MG CAPS capsule Take 1 capsule (0.4 mg total) by mouth daily. Patient taking differently: Take 0.4 mg by mouth daily as needed (kidney stones). 07/10/21     vedolizumab  (ENTYVIO ) 300 MG injection  Inject 300 mg into the vein every 8 (eight) weeks. 11/11/21   Ruby Corporal, MD                                                                                                                                    Past Surgical History Past Surgical History:  Procedure Laterality Date   ABDOMINAL HYSTERECTOMY     ANKLE ARTHROSCOPY  06/01/2012   Procedure: ANKLE ARTHROSCOPY;  Surgeon: Janifer Meigs, MD;  Location: [facility];  Service: Orthopedics;  Laterality: Left;  left ankle arthorsocpy with extensive debridement and gastroc slide   ANKLE SURGERY Left 05/11/2018   BIOPSY  04/26/2019   Procedure: BIOPSY;  Surgeon: Ruby Corporal, MD;  [facility];  Service: Endoscopy;;  ileum colon   BIOPSY  03/25/2022   Procedure: BIOPSY;  Surgeon: Urban Garden, MD;  Location: [facility];  Service: Gastroenterology;;   CARDIAC CATHETERIZATION  11/22/2009 and 2005   WNL   CESAREAN SECTION     CHOLECYSTECTOMY  09/08/2006   lap. chole.   CHONDROPLASTY  06/17/2012   Procedure: CHONDROPLASTY;  Surgeon: Darrin Emerald, MD;  Location: [facility];  Service: Orthopedics;  Laterality: Right;  right patella   COLONOSCOPY N/A 04/26/2019   Rehman: a few  erosions in ternimal ileum, two erosions in cecum (assocaited with acute inflammation) diverticulosis in sigmoid hepatic flexure and ascending colon, external hemorrhoids   COLONOSCOPY WITH PROPOFOL  N/A 03/25/2022   Procedure: COLONOSCOPY WITH PROPOFOL ;  Surgeon: Urban Garden, MD;  [facility]: [facility];  Service: Gastroenterology;  Laterality: N/A;  830 ASA 1   ESOPHAGOGASTRODUODENOSCOPY N/A 05/14/2015   Procedure: ESOPHAGOGASTRODUODENOSCOPY (EGD);  Surgeon: Ruby Corporal, MD;  [facility];  Service: Endoscopy;  Laterality: N/A;  730   ESOPHAGOGASTRODUODENOSCOPY (EGD) WITH PROPOFOL  N/A 03/25/2022   Procedure: ESOPHAGOGASTRODUODENOSCOPY (EGD) WITH PROPOFOL ;  Surgeon: Urban Garden, MD;  [facility];  Service: Gastroenterology;  Laterality: N/A;   GIVENS CAPSULE STUDY N/A 05/24/2019   Procedure: GIVENS CAPSULE STUDY;  Surgeon: Ruby Corporal, MD;  [facility]: [facility];  Service: Endoscopy;  Laterality: N/A;  7:30   INGUINAL HERNIA REPAIR  10/30/2008   right   KNEE ARTHROSCOPY Right 2013   KNEE ARTHROSCOPY WITH LATERAL MENISECTOMY Left 12/23/2016   Procedure: LEFT KNEE ARTHROSCOPY WITH LATERAL MENISECTOMY;  Surgeon: Darrin Emerald, MD;  Location: [facility];  Service: Orthopedics;  Laterality: Left;   KNEE ARTHROSCOPY WITH MEDIAL MENISECTOMY Left 12/23/2016   Procedure: LEFT KNEE ARTHROSCOPY WITH MEDIAL MENISECTOMY CHONDROPLASTY PATELLA  AND MEDIAL FEMORAL CONDYLE LEFT KNEE;  Surgeon: Darrin Emerald, MD;  Location: [facility];  Service: Orthopedics;  Laterality: Left;   POLYPECTOMY  03/25/2022   Procedure: POLYPECTOMY;  Surgeon: Urban Garden, MD;  [facility];  Service: Gastroenterology;;   SHOULDER ARTHROSCOPY WITH SUBACROMIAL DECOMPRESSION AND BICEP TENDON REPAIR Left  12/21/2017   Procedure: Left shoulder arthroscopic biceps tenodesis, SAD, DCR and labrum debridement;  Surgeon: Janeth Medicus, MD;  Location: [facility]  OR;  Service: Orthopedics;  Laterality: Left;  120 mins   SHOULDER SURGERY     right x 2    TOTAL KNEE ARTHROPLASTY Left 06/06/2019   Procedure: TOTAL KNEE ARTHROPLASTY;  Surgeon: Claiborne Crew, MD;  Location: [facility];  Service: Orthopedics;  Laterality: Left;  70 mins   TOTAL KNEE ARTHROPLASTY Right 01/23/2021   Procedure: TOTAL KNEE ARTHROPLASTY;  Surgeon: Claiborne Crew, MD;  Location: [facility];  Service: Orthopedics;  Laterality: Right;  70 mins   Family History Family History  Problem Relation Age of Onset   Hypertension Mother    Atrial fibrillation Mother    Arthritis Mother    Lupus Father    COPD Father    Rheum arthritis Father    Sarcoidosis Father    Lupus Sister    Congestive Heart Failure Maternal Grandmother    Migraines Neg Hx    Headache Neg Hx     Social History Social History   Tobacco Use   Smoking status: Never    Passive exposure: Never   Smokeless tobacco: Never  Vaping Use   Vaping status: Never Used  Substance Use Topics   Alcohol use: No    Alcohol/week: 0.0 standard drinks of alcohol   Drug use: Never   Allergies Bee pollen, Hydromorphone  hcl, Iodinated contrast media, Other, Wasp venom, Pollen extract, Adhesive [tape], and Omnipaque  [iohexol ]  Review of Systems Review of Systems  All other systems reviewed and are negative.   Physical Exam Vital Signs  I have reviewed the triage vital signs BP 116/70   Pulse 81   Temp 98.6 F (37 C) (Oral)   Resp 17   Ht 5' 11.5" (1.816 m)   Wt 117.9 kg   SpO2 98%   BMI 35.76 kg/m  Physical Exam Vitals and nursing note reviewed.  Constitutional:      General: She is not in acute distress.    Appearance: She is well-developed.  HENT:     Head: Normocephalic and atraumatic.     Mouth/Throat:     Mouth: Mucous membranes are moist.  Eyes:     Pupils: Pupils are equal, round, and reactive to light.  Cardiovascular:     Rate and Rhythm: Normal rate and regular rhythm.     Pulses:           Radial pulses are 2+ on the right side and 2+ on the left side.       Dorsalis pedis pulses are 2+ on the right side and 2+ on the left side.     Heart sounds: No murmur heard. Pulmonary:     Effort: Pulmonary effort is normal. No respiratory distress.     Breath sounds: Normal breath sounds.  Abdominal:     General: Abdomen is flat.     Palpations: Abdomen is soft.     Tenderness: There is no abdominal tenderness.  Musculoskeletal:        General: No tenderness.     Right lower leg: No edema.     Comments: Slight edema and tenderness around the left ankle  Skin:    General: Skin is warm and dry.  Neurological:     General: No focal deficit present.     Mental Status: She is alert. Mental status is at baseline.  Psychiatric:        Mood  and Affect: Mood normal.        Behavior: Behavior normal.     ED Results and Treatments Labs (all labs ordered are listed, but only abnormal results are displayed) Labs Reviewed  BASIC METABOLIC PANEL WITH GFR - Abnormal; Notable for the following components:      Result Value   Potassium 3.2 (*)    Glucose, Bld 129 (*)    All other components within normal limits  CBC  D-DIMER, QUANTITATIVE  TROPONIN I (HIGH SENSITIVITY)  TROPONIN I (HIGH SENSITIVITY)                                                                                                                          Radiology DG Chest 2 View Result Date: 02/08/2024 CLINICAL DATA:  Chest pain, shortness of breath EXAM: CHEST - 2 VIEW COMPARISON:  10/16/2022 FINDINGS: The heart size and mediastinal contours are within normal limits. Both lungs are clear. The visualized skeletal structures are unremarkable. IMPRESSION: No active cardiopulmonary disease. Electronically Signed   By: Janeece Mechanic M.D.   On: 02/08/2024 18:30    Pertinent labs & imaging results that were available during my care of the patient were reviewed by me and considered in my medical decision making (see MDM for  details).  Medications Ordered in ED Medications  acetaminophen  (TYLENOL ) tablet 1,000 mg (1,000 mg Oral Patient Refused/Not Given 02/08/24 2005)  ketorolac  (TORADOL ) 15 MG/ML injection 15 mg (has no administration in time range)  potassium chloride  (KLOR-CON ) packet 40 mEq (has no administration in time range)                                                                                                                                     Procedures Procedures  (including critical care time)  Medical Decision Making / ED Course   MDM:  58 year old presenting to the emergency department with chest pain.  Patient overall well-appearing, physical examination with no focal findings other than slight tenderness to the left ankle, pulses equal in all 4 extremities.  Differential includes musculoskeletal chest pain, ACS, pulmonary embolism, pneumonia, pneumothorax.  Chest x-ray is reassuring does not show any pneumothorax or pneumonia.  Initial troponin negative.  EKG reassuring.  Lower concern for ACS, symptoms atypical.  Does have some pain in the left leg although this seems more related to her recent ankle injury per patient.  No  lower extremity edema or calf tenderness to suggest DVT.  Will check D-dimer.  If this is positive would proceed with CT to evaluate for pulmonary embolism.  Considered also dissection given radiation of the back however pulses equal, cardiomediastinal silhouette is stable, no sudden onset of symptoms, seems less likely.  Will reassess.  Clinical Course as of 02/08/24 2121  Tue Feb 08, 2024  2119 Troponin negative x 2, D-dimer is also negative.  Patient also reports pain is worse with movement such as bending or twisting.  Seems more musculoskeletal.  Very low concern for other process such as dissection.  Feel patient is safe for discharge. Will discharge patient to home. All questions answered. Patient comfortable with plan of discharge. Return precautions  discussed with patient and specified on the after visit summary.  [WS]    Clinical Course User Index [WS] Mordecai Applebaum, MD     Additional history obtained:  -External records from outside source obtained and reviewed including: Chart review including previous notes, labs, imaging, consultation notes including prior cardiac testing including normal recent nuclear medicine study    Lab Tests: -I ordered, reviewed, and interpreted labs.   The pertinent results include:   Labs Reviewed  BASIC METABOLIC PANEL WITH GFR - Abnormal; Notable for the following components:      Result Value   Potassium 3.2 (*)    Glucose, Bld 129 (*)    All other components within normal limits  CBC  D-DIMER, QUANTITATIVE  TROPONIN I (HIGH SENSITIVITY)  TROPONIN I (HIGH SENSITIVITY)    Notable for mild hypokalemia   EKG   EKG Interpretation Date/Time:  Tuesday Feb 08 2024 18:05:21 EDT Ventricular Rate:  108 PR Interval:  167 QRS Duration:  125 QT Interval:  358 QTC Calculation: 480 R Axis:   -17  Text Interpretation: Sinus tachycardia Nonspecific ST abnormality No significant change since last tracing Confirmed by Hiawatha Lout ([id number]) on 02/08/2024 8:01:55 PM         Imaging Studies ordered: I ordered imaging studies including CXR On my interpretation imaging demonstrates no acute process I independently visualized and interpreted imaging. I agree with the radiologist interpretation   Medicines ordered and prescription drug management: Meds ordered this encounter  Medications   acetaminophen  (TYLENOL ) tablet 1,000 mg   ketorolac  (TORADOL ) 15 MG/ML injection 15 mg   potassium chloride  (KLOR-CON ) packet 40 mEq    -I have reviewed the patients home medicines and have made adjustments as needed   Social Determinants of Health:  Diagnosis or treatment significantly limited by social determinants of health: obesity   Reevaluation: After the interventions noted above, I  reevaluated the patient and found that their symptoms have improved  Co morbidities that complicate the patient evaluation  Past Medical History:  Diagnosis Date   Anxiety    Arthritis    Asthma    Cervical hyperplasia    hisotry of   Chest pain    cath 2005 norm cors, repeat cath Feb 2011 normal cors and only minimally  elevated pulmonary pressures   Crohn disease (HCC)    Diverticulitis of colon 07/17/2014   Diverticulosis    Dyspnea    GERD (gastroesophageal reflux disease)    Hiatal hernia    History of iron deficiency anemia    Hypertension    Idiopathic intracranial hypertension    Per patient   Migraines    Obesity    Palpitations    Pneumonia 2008   14 days hospitalization, bilateral  PONV (postoperative nausea and vomiting)    Sleep apnea sleep study 11/03/2009    cpap; does sometimes, doesnt use every night. weight loss no longer using CPAP   Vitamin B deficiency       Dispostion: Disposition decision including need for hospitalization was considered, and patient discharged from emergency department.    Final Clinical Impression(s) / ED Diagnoses Final diagnoses:  Chest pain, unspecified type     This chart was dictated using voice recognition software.  Despite best efforts to proofread,  errors can occur which can change the documentation meaning.    Mordecai Applebaum, MD 02/08/24 2121

## 2024-02-10 ENCOUNTER — Encounter: Payer: Self-pay | Admitting: Gastroenterology

## 2024-02-10 ENCOUNTER — Telehealth: Payer: Self-pay

## 2024-02-10 NOTE — Telephone Encounter (Signed)
 Pt has blood work results are on your desk in yellow folder

## 2024-02-22 NOTE — Telephone Encounter (Signed)
 Lab collected 3/27 just prior to infusion.  Vedolizumab  drug level was 7.1 No antibodies  Previously, levels were 17 mcg/mL in 2023.   Currently not a clear guideline for optimal thresholds for trough concentrations but between 12-15 mcg/mL has shown association with clinical and endoscopic remissions.    May need to consider changing to shortened frequency. Will discuss with Dr. Castaneda.

## 2024-02-24 DIAGNOSIS — K5000 Crohn's disease of small intestine without complications: Secondary | ICD-10-CM | POA: Diagnosis not present

## 2024-02-29 ENCOUNTER — Other Ambulatory Visit (HOSPITAL_COMMUNITY): Payer: Self-pay | Admitting: Family Medicine

## 2024-02-29 ENCOUNTER — Ambulatory Visit
Admission: RE | Admit: 2024-02-29 | Discharge: 2024-02-29 | Disposition: A | Discharge status: Home / Self Care | Payer: Commercial Managed Care - PPO | Source: Ambulatory Visit | Attending: Family Medicine | Admitting: Family Medicine

## 2024-02-29 ENCOUNTER — Encounter (INDEPENDENT_AMBULATORY_CARE_PROVIDER_SITE_OTHER): Payer: Self-pay

## 2024-02-29 ENCOUNTER — Ambulatory Visit: Payer: Commercial Managed Care - PPO

## 2024-02-29 DIAGNOSIS — N6320 Unspecified lump in the left breast, unspecified quadrant: Secondary | ICD-10-CM

## 2024-02-29 DIAGNOSIS — R928 Other abnormal and inconclusive findings on diagnostic imaging of breast: Secondary | ICD-10-CM | POA: Diagnosis not present

## 2024-03-06 NOTE — Telephone Encounter (Signed)
 Mandy: can we get recent labs from [facility] infusion? 5/22.

## 2024-03-13 ENCOUNTER — Telehealth: Payer: Self-pay | Admitting: Cardiovascular Disease

## 2024-03-13 NOTE — Telephone Encounter (Signed)
 Pt called to report that she has been seen in the ED recently for chest pain... she r/o MI, PE... she had an appt with E Monge on 03/24/24 but asking to move up so I was able to get her in 03/20/24.  Pt has been having chest pain on the left side and she feels it in her shoulder, sometimes worse with movement... she has had a lot if GERD recently.   She had a "normal" cardiac Pet 03/2023.   Pt will keep the appt for 6/16 but will call if she develops any worsening pain or go back to the ED.

## 2024-03-13 NOTE — Telephone Encounter (Signed)
 Pt c/o of Chest Pain: STAT if active (IN THIS MOMENT) CP, including tightness, pressure, jaw pain, shoulder/upper arm/back pain, SOB, nausea, and vomiting.  1. Are you having CP right now (tightness, pressure, or discomfort)? Yes   2. Are you experiencing any other symptoms (ex. SOB, nausea, vomiting, sweating)? Nothing currently she states but she does sound SOB   3. How long have you been experiencing CP? Few days   4. Is your CP continuous or coming and going? Coming and going   5. Have you taken Nitroglycerin ? No   6. If CP returns before callback, please consider calling 911. ?

## 2024-03-15 ENCOUNTER — Ambulatory Visit: Admitting: Nurse Practitioner

## 2024-03-19 NOTE — Progress Notes (Unsigned)
 Cardiology Office Note:  .   Date:  03/20/2024  ID:  [name], DOB 12-20-1965, MRN [id number] PCP: Minus Amel, MD  [facility] Providers Cardiologist:  Janelle Mediate, MD {  History of Present Illness: .   [name] is a 58 y.o. female with history of Crohn's disease, GERD, hypertension.     Chest pain Reportedly 2005 and 2011 had heart cath with normal coronary anatomy ER visit January 2024 for atypical chest pain with nonspecific T wave changes.  Echocardiogram and echo/CT was normal.  Scheduled as as needed basis.     Patient seen previously for atypical chest pain by cardiology for ER follow-up with negative stress test and echocardiogram.  Also remotely has had 2 negative left heart catheterizations.  She was seen recently in the ER in May 2025 reporting palpitations and reported chest pain.  Troponins negative x 2.  Thought to be musculoskeletal.  Today patient presents for hospital follow-up.  She reports chest pain that is sharp and radiating to her back.  Sometimes associated with shortness of breath.  Generally nonexertional symptoms.  No nausea or diaphoresis.  Also reports she checks her heart rate with short bursts of tachycardia with heart rates in the 140s and occasional palpitations.  She also describes other nonspecific symptoms such as loss of balance, pain when bending over, feeling off.  However does not feel the symptoms are exacerbated by food and have no specific timing.  She has had these complaints with increased frequency and therefore went to the emergency room last month. Does not smoke, drink, do drugs.  Reports being mildly active without any decrease in exercise tolerance  ROS: Denies: shortness of breath, orthopnea, peripheral edema, , decreased exercise intolerance, fatigue, lightheadedness.   Studies Reviewed: .         Risk Assessment/Calculations:             Physical Exam:   VS:  BP 110/70   Pulse 70   Ht 5' 11 (1.803 m)    Wt 268 lb 3.2 oz (121.7 kg)   SpO2 97%   BMI 37.41 kg/m    Wt Readings from Last 3 Encounters:  03/20/24 268 lb 3.2 oz (121.7 kg)  02/08/24 260 lb (117.9 kg)  12/07/23 264 lb 12.8 oz (120.1 kg)    GEN: Well nourished, well developed in no acute distress NECK: No JVD; No carotid bruits CARDIAC: RRR, no murmurs, rubs, gallops RESPIRATORY:  Clear to auscultation without rales, wheezing or rhonchi  ABDOMEN: Soft, non-tender, non-distended EXTREMITIES:  No edema; No deformity   ASSESSMENT AND PLAN: .    Chest pain Previously has had atypical symptoms in the past.  She has remote history of 2 negative heart catheterizations 2005 and 2011.  Echocardiogram and PET/CT in 2024 were unremarkable.  She continues to exhibit atypical symptoms with very reassuring workup and very mild risk factors besides hypertension.  We discussed performing coronary CT and heart monitor.  She has declined both of these for the time being.  Suspect symptoms may be coincided with her extensive GI history.  Encouraged her to follow-up with her PCP and GI doctor.  Hypertension 110/70 today. Very well-controlled on amlodipine  5 mg, losartan  100 mg daily.  Crohn's disease Reports having extensive GI history with history of GERD.  Certainly could be contributing.      Dispo: Follow-up as needed.  Reports having appointment with her PCP in the next 1 to 2 weeks.  Continue to  workup secondary etiologies then.  Signed, Burnetta Cart, PA-C

## 2024-03-20 ENCOUNTER — Ambulatory Visit: Attending: Physician Assistant | Admitting: Cardiology

## 2024-03-20 ENCOUNTER — Encounter: Payer: Self-pay | Admitting: Cardiology

## 2024-03-20 VITALS — BP 110/70 | HR 70 | Ht 71.0 in | Wt 268.2 lb

## 2024-03-20 DIAGNOSIS — R072 Precordial pain: Secondary | ICD-10-CM | POA: Diagnosis not present

## 2024-03-20 DIAGNOSIS — I10 Essential (primary) hypertension: Secondary | ICD-10-CM

## 2024-03-20 NOTE — Patient Instructions (Signed)
 Medication Instructions:   Your physician recommends that you continue on your current medications as directed. Please refer to the Current Medication list given to you today.  *If you need a refill on your cardiac medications before your next appointment, please call your pharmacy*  Lab Work: None ordered.  If you have labs (blood work) drawn today and your tests are completely normal, you will receive your results only by: MyChart Message (if you have MyChart) OR A paper copy in the mail If you have any lab test that is abnormal or we need to change your treatment, we will call you to review the results.  Testing/Procedures: None ordered.   Follow-Up: At [facility], you and your health needs are our priority.  As part of our continuing mission to provide you with exceptional heart care, our providers are all part of one team.  This team includes your primary Cardiologist (physician) and Advanced Practice Providers or APPs (Physician Assistants and Nurse Practitioners) who all work together to provide you with the care you need, when you need it.  Your next appointment:   Follow up as needed

## 2024-03-24 ENCOUNTER — Ambulatory Visit: Admitting: Nurse Practitioner

## 2024-03-27 NOTE — Telephone Encounter (Signed)
 Addressed on 6/16 in different thread. Felt to have an IBS overlay. Needs outpatient appt.   Devere: please make follow-up with myself or Dr. Eartha. Thanks!

## 2024-03-28 NOTE — Patient Instructions (Incomplete)

## 2024-03-28 NOTE — Progress Notes (Deleted)
 No chief complaint on file.  HISTORY OF PRESENT ILLNESS:  03/30/2024 ALL: Pev returns for follow up for migraines. She was last seen 03/2023. We  continued Ajovy  monthly, acetazolamide  500/1000mg , amitriptyline  25mg  QHS and rizatriptan  PRN. Since,   She could not tolerate CPAP therapy in past.   03/24/2023 ALL: Pev returns for follow up for migraines. She was last seen 09/2022. We switched from Nurtec to Ajovy  and continued acetazolamide  500/1000mg , amitriptyline   and rizatriptan  PRN. Since, she reports headaches have improved. She was having 4-8 migraines per month, now having 2-3. Rizatriptan  seems to work well. She does have pressure in the back of head. Recent eye exam reportedly normal. BP is well managed. She has a history of sleep apnea but did not like CPAP therapy. She had lost weight but has gained it back. She is hoping to see ENT to discuss tonsillectomy due to chronic tonsil stones.   09/15/2022 ALL: Pev returns for follow up for migraines and IIH. She was last seen 03/2022. We switched from Emgality  back to Ajovy . Ajovy  was not approved and we switched to Nurtec QOD. She continued amitriptyline  25mg  QHS, acetazolamide  500 TID and rizatriptan  PRN. She reports no significant change in headaches. She reports near daily headaches. She has to take rizatriptan  1-2 times a week. Eye exam was unremarkable about 2 months ago.   03/11/2022 ALL: Taraya returns for follow up for migraines and IIH. She was last seen 03/2021 and doing well on Emgality , amitriptyline  25mg  QHS, acetazolamide  500 BID and rizatriptan  PRN. She called 06/2021 with worsening tension headaches and advised to increase acetazolamide  to 500/1000. Since, she feels that she was doing a little better for a couple of months but recently having more headaches. She continues to have near daily headaches. She has about 5-10 migraines per month. Right eye is sensitive to light. She continues to feel pressure in right eye and has some  pressure. Last eye exam was about this time last year.  Rizatriptan  does work for abortive therapy. She also uses complementary therapy. BP has been better. She was recently started Entyvio  injections for Chron's. She has gained some weight.   03/11/2021 ALL:  Princesa returns for follow up for migraines and IIH. She was taking Ajovy , amitriptyline , acetazolamide  and rizatriptan . Ajovy  was no longer covered on insurance and she was switched to Emgality . She has had 1 injection. She is tolerating acetazolamide . Amitriptyline  helps with sleep. She feels that headaches are well managed. She does have some sort of headache almost every day. She takes rizatriptan  maybe once a month. Some months she doesn't have to take it. She had right TKR 01/23/2021. She took oxycodone  post surgery but has not taken regularly. She has tried to wean gabapentin . She is having more low back pain and nerve pain of right leg. She called ortho for a refill of gabapentin .   10/14/2020 AA: Interval history October 13, 2020(last seen by myself 9/17 for appointment and 10/72021 for emg/ncs and also seen by NP Aurelius Gildersleeve 07/29/2020: Patient is here for a follow-up of migraines and other multiple neurologic symptoms.  At last appointment she was started on Ajovy  and amitriptyline  for her migraines, Diamox  after LP with elevated opening pressure, rizatriptan  for acute migraine management, and an EMG nerve conduction study was performed which did not show anything significant to explain all her other multiple neurologic symptoms(See below) in the setting of mood disorder which may be contributory.  Per Sefora Tietje the rizatriptan  was helping, amitriptyline  was  helping her sleep.  She also endorsed some decreased intensity of headaches and migraines to starting Ajovy , amitriptyline  and Diamox .  Mylea Roarty continued current treatment.  Since last being seen she was seen by ophthalmology.  Neuro exam is nonfocal.   Her headaches and migraines are  feeling better. She still has the pressure in the head but improved. Increase Diamox , keep her on the Ajovy .   07/29/2020 ALL:  DILLON MCREYNOLDS is a 58 y.o. female here today for follow up for headaches. She was started on Ajovy  and amitriptyline  for migraine prevention as well as rizatriptan  for abortive therapy in 06/2020. MRI showed concerns for partially empty sella and she was started on Diamox  250mg  BID. She had a LP on 07/17/2020 and started on Diamox  07/18/2020. She called 07/22/2020 reporting symptoms of increased pressure and pulsating of head with bilateral lower back pain (not at site of LP). Symptoms lasted for about 2-3 hours. She felt that she was having a hard time walking. She states that head pressure comes and goes. She has taken rizatriptan  three times and it does abort migraine. She feels that she has brain fog at times. She has noted some tingling of feet. She is sleeping better after starting amitriptyline .   Low back continues with any prolonged standing. She has numbness and tingling of her lower extremities, worse on left. She reports her feet feel like they are going to sleep. NCS/EMG showed possible remote lumbosacral radiculopathy. She is was previously seeing Dr Dolphus for similar symptoms. Referral was placed back to Dr Dolphus.   HISTORY (copied from Dr Sharion note on 06/21/2020)  HPI:  GINNI EICHLER is a 58 y.o. female here as requested by Marvine Rush, MD for headaches.  Past medical history obesity, headache, vitamin D  deficiency.  I reviewed Dr. Bertha notes which provided no significant history of patient's chief complaint.  She is here today alone, she has been having headaches, started sometime in May, she had a physical at the end of the May and discussed headaches have been going away, also tired no energy, found out she was B12 deficiency and started shots per month.  Headaches are minute, can also light sensitivity and nausea, she has some GI issues,  Crohn's, has been on steroids.  She also reports nerve pain and stiffness in her hands and feet, she stopped using CPAP due to losing weight and long longer having any problems. She has been having headaches she has pressure in the back of her head at the occipital area and down to her neck, she has tried massage. They start in over the eyes as well. When they are really intense, she is sensitive to light, she has pressure in the back, nausea, pulsating/pulsing/throbbing. She is having the migraines at least 4x a week, they never really go away they just minimize, she will wake up with headaches, she had lost about 40 pounds and she has OSA in the past. She has gained 25 pounds since March, she doesn't sleep, ongoing for about a year after she found her brother dead in 2024-09-28. She is fatigued, she has sleep issues. She staets she doesn't sleep all night and go without any sleep for 3 day - she says literally no sleep she turns a lot.  She has vision changes, headache are positional worse supinein the morning, morning headaches. She has muscle cramps, lots of stress. No other focal neurologic deficits, associated symptoms, inciting events or modifiable factors.   Reviewed notes, labs  and imaging from outside physicians, which showed:   MRI brain 2017: personally reviewed images and agree with the following:  Brain: No diffusion signal abnormality. Stable partially empty sella turcica. No significant T2 FLAIR signal abnormality. No abnormal enhancement. No abnormal susceptibility hypointensity to indicate intracranial hemorrhage. No focal mass effect. No extra-axial collection. Normal ventricle size.   MRA H/N 09/2016 reviewed reports/findings:   1. No acute intracranial abnormality. Normal MRI of the brain for age. 2. Patent circle of Willis. No large vessel occlusion, aneurysm, or significant stenosis is identified. 3. Patent carotid and vertebral arteries of the neck. No dissection, aneurysm, or  significant stenosis is identified.   MRI cervical spine 01/08/2012:  personally reviewed images and agree with the following: 1. Tiny left paracentral disc protrusion at C5-6 without significant  canal or neural foraminal stenosis. Finding is stable relative to  previous MRI from 05/28/2007.  2. Mild degenerative disc disease at C4-5 without canal or foraminal  stenosis.  3. Otherwise unremarkable MRI of the cervical spine.    CBC/CMP: normal 04/12/2020   Amlodipine , tylenol , aspirin , flexeril , gabapentin , ibuprofen , toradol  injection, magnesium , losartan , meclizine , mobic , reglan , zofran , tramadol , topiramate   REVIEW OF SYSTEMS: Out of a complete 14 system review of symptoms, the patient complains only of the following symptoms, headaches, and all other reviewed systems are negative.   ALLERGIES: Allergies  Allergen Reactions   Bee Pollen Anaphylaxis, Other (See Comments) and Swelling    Other Reaction(s): Other (See Comments)  bee pollen   Hydromorphone  Hcl Other (See Comments) and Nausea And Vomiting    Other Reaction(s): Not available  hydromorphone   Other Reaction(s): headache    hydromorphone    Iodinated Contrast Media Anaphylaxis, Hives, Itching and Nausea Only    Pt. Was premedicated with emergent protocol.   Other Shortness Of Breath    Lemon grass   Wasp Venom Anaphylaxis and Shortness Of Breath   Pollen Extract Swelling   Adhesive [Tape] Rash   Omnipaque  [Iohexol ] Hives and Nausea Only    Pt. Was premedicated with emergent protocol.     HOME MEDICATIONS: Outpatient Medications Prior to Visit  Medication Sig Dispense Refill   acetaminophen  (TYLENOL ) 500 MG tablet Take 2 tablets (1,000 mg total) by mouth every 8 (eight) hours. 30 tablet 0   acetaZOLAMIDE  ER (DIAMOX ) 500 MG capsule Take 1 capsule (500 mg total) by mouth every morning AND 2 capsules (1,000 mg total) every evening. 270 capsule 3   albuterol  (PROVENTIL ) (2.5 MG/3ML) 0.083% nebulizer solution  Inhale the contents of 1 vial via nebulizer every 6 hours as needed 90 mL 1   albuterol  (VENTOLIN  HFA) 108 (90 Base) MCG/ACT inhaler Inhale 1-2 puffs by mouth every 4 hours as needed 6.7 g 9   amitriptyline  (ELAVIL ) 25 MG tablet TAKE 1 TABLET BY MOUTH AT BEDTIME 90 tablet 3   amLODipine  (NORVASC ) 5 MG tablet Take 1 tablet (5 mg total) by mouth daily. 90 tablet 2   Cyanocobalamin  (VITAMIN B-12 IJ) Inject 1 Dose as directed every 30 (thirty) days.     cyclobenzaprine  (FLEXERIL ) 5 MG tablet Take 1 tablet (5 mg total) by mouth every 8 (eight) hours as needed for muscle spasms. 40 tablet 2   diclofenac  Sodium (VOLTAREN ) 1 % GEL Apply 1 Application topically 4 (four) times daily as needed (pain).     diphenhydrAMINE  (BENADRYL ) 25 MG tablet Take 25-50 mg by mouth every 6 (six) hours as needed for allergies.     EPINEPHrine  (EPIPEN  2-PAK) 0.3 mg/0.3 mL  IJ SOAJ injection Inject as directed for anaphylaxis. 2 each 5   famotidine  (PEPCID ) 40 MG tablet Take 1 tablet by mouth at bedtime. Needs office visit. 90 tablet 3   fluticasone  (FLONASE ) 50 MCG/ACT nasal spray Place 2 sprays into both nostrils daily. 16 g 6   Fremanezumab -vfrm (AJOVY ) 225 MG/1.5ML SOAJ Inject 225 mg into the skin every 30 (thirty) days. 4.5 mL 3   hyoscyamine  (LEVSIN  SL) 0.125 MG SL tablet Dissolve 1 tablet under the tongue 3 times daily before meals. 90 tablet 1   loratadine  (CLARITIN ) 10 MG tablet Take 10 mg by mouth at bedtime.     losartan  (COZAAR ) 100 MG tablet Take 1 tablet (100 mg total) by mouth daily. 90 tablet 1   meclizine  (ANTIVERT ) 25 MG tablet Take 25 mg by mouth 3 (three) times daily as needed for dizziness.     montelukast  (SINGULAIR ) 10 MG tablet Take 1 tablet (10 mg total) by mouth daily. 90 tablet 3   omeprazole  (PRILOSEC) 40 MG capsule Take 1 capsule (40 mg total) by mouth daily. 90 capsule 3   ondansetron  (ZOFRAN -ODT) 4 MG disintegrating tablet Dissolve 1 tablet by mouth every 8 hours as needed for nausea or vomiting.  90 tablet 1   polyethylene glycol (MIRALAX  / GLYCOLAX ) 17 g packet Take 17 g by mouth daily.     rizatriptan  (MAXALT -MLT) 10 MG disintegrating tablet Dissolve 1 tablet (10 mg total) by mouth as needed for migraine. May repeat in 2 hours if needed 9 tablet 11   tamsulosin  (FLOMAX ) 0.4 MG CAPS capsule Take 1 capsule (0.4 mg total) by mouth daily. 30 capsule 2   vedolizumab  (ENTYVIO ) 300 MG injection Inject 300 mg into the vein every 8 (eight) weeks.     No facility-administered medications prior to visit.     PAST MEDICAL HISTORY: Past Medical History:  Diagnosis Date   Anxiety    Arthritis    Asthma    Cervical hyperplasia    hisotry of   Chest pain    cath 2005 norm cors, repeat cath Feb 2011 normal cors and only minimally  elevated pulmonary pressures   Crohn disease (HCC)    Diverticulitis of colon 07/17/2014   Diverticulosis    Dyspnea    GERD (gastroesophageal reflux disease)    Hiatal hernia    History of iron deficiency anemia    Hypertension    Idiopathic intracranial hypertension    Per patient   Migraines    Obesity    Palpitations    Pneumonia 2008   14 days hospitalization, bilateral   PONV (postoperative nausea and vomiting)    Sleep apnea sleep study 11/03/2009    cpap; does sometimes, doesnt use every night. weight loss no longer using CPAP   Vitamin B deficiency      PAST SURGICAL HISTORY: Past Surgical History:  Procedure Laterality Date   ABDOMINAL HYSTERECTOMY     ANKLE ARTHROSCOPY  06/01/2012   Procedure: ANKLE ARTHROSCOPY;  Surgeon: Deward DELENA Schwartz, MD;  Location: [facility];  Service: Orthopedics;  Laterality: Left;  left ankle arthorsocpy with extensive debridement and gastroc slide   ANKLE SURGERY Left 05/11/2018   BIOPSY  04/26/2019   Procedure: BIOPSY;  Surgeon: Golda Claudis PENNER, MD;  [facility];  Service: Endoscopy;;  ileum colon   BIOPSY  03/25/2022   Procedure: BIOPSY;  Surgeon: Eartha Angelia Sieving, MD;   Location: [facility];  Service: Gastroenterology;;   CARDIAC CATHETERIZATION  11/22/2009  and 2005   WNL   CESAREAN SECTION     CHOLECYSTECTOMY  09/08/2006   lap. chole.   CHONDROPLASTY  06/17/2012   Procedure: CHONDROPLASTY;  Surgeon: Taft FORBES Minerva, MD;  Location: [facility];  Service: Orthopedics;  Laterality: Right;  right patella   COLONOSCOPY N/A 04/26/2019   Rehman: a few erosions in ternimal ileum, two erosions in cecum (assocaited with acute inflammation) diverticulosis in sigmoid hepatic flexure and ascending colon, external hemorrhoids   COLONOSCOPY WITH PROPOFOL  N/A 03/25/2022   Procedure: COLONOSCOPY WITH PROPOFOL ;  Surgeon: Eartha Angelia Sieving, MD;  [facility];  Service: Gastroenterology;  Laterality: N/A;  830 ASA 1   ESOPHAGOGASTRODUODENOSCOPY N/A 05/14/2015   Procedure: ESOPHAGOGASTRODUODENOSCOPY (EGD);  Surgeon: Claudis RAYMOND Rivet, MD;  [facility];  Service: Endoscopy;  Laterality: N/A;  730   ESOPHAGOGASTRODUODENOSCOPY (EGD) WITH PROPOFOL  N/A 03/25/2022   Procedure: ESOPHAGOGASTRODUODENOSCOPY (EGD) WITH PROPOFOL ;  Surgeon: Eartha Angelia Sieving, MD;  [facility];  Service: Gastroenterology;  Laterality: N/A;   GIVENS CAPSULE STUDY N/A 05/24/2019   Procedure: GIVENS CAPSULE STUDY;  Surgeon: Rivet Claudis RAYMOND, MD;  [facility];  Service: Endoscopy;  Laterality: N/A;  7:30   INGUINAL HERNIA REPAIR  10/30/2008   right   KNEE ARTHROSCOPY Right 2013   KNEE ARTHROSCOPY WITH LATERAL MENISECTOMY Left 12/23/2016   Procedure: LEFT KNEE ARTHROSCOPY WITH LATERAL MENISECTOMY;  Surgeon: Taft FORBES Minerva, MD;  Location: [facility];  Service: Orthopedics;  Laterality: Left;   KNEE ARTHROSCOPY WITH MEDIAL MENISECTOMY Left 12/23/2016   Procedure: LEFT KNEE ARTHROSCOPY WITH MEDIAL MENISECTOMY CHONDROPLASTY PATELLA  AND MEDIAL FEMORAL CONDYLE LEFT KNEE;  Surgeon: Taft FORBES Minerva, MD;  Location: [facility];  Service: Orthopedics;  Laterality:  Left;   POLYPECTOMY  03/25/2022   Procedure: POLYPECTOMY;  Surgeon: Eartha Angelia Sieving, MD;  [facility];  Service: Gastroenterology;;   SHOULDER ARTHROSCOPY WITH SUBACROMIAL DECOMPRESSION AND BICEP TENDON REPAIR Left 12/21/2017   Procedure: Left shoulder arthroscopic biceps tenodesis, SAD, DCR and labrum debridement;  Surgeon: Sharl Selinda Dover, MD;  Location: [facility];  Service: Orthopedics;  Laterality: Left;  120 mins   SHOULDER SURGERY     right x 2    TOTAL KNEE ARTHROPLASTY Left 06/06/2019   Procedure: TOTAL KNEE ARTHROPLASTY;  Surgeon: Ernie Cough, MD;  Location: [facility];  Service: Orthopedics;  Laterality: Left;  70 mins   TOTAL KNEE ARTHROPLASTY Right 01/23/2021   Procedure: TOTAL KNEE ARTHROPLASTY;  Surgeon: Ernie Cough, MD;  Location: [facility];  Service: Orthopedics;  Laterality: Right;  70 mins     FAMILY HISTORY: Family History  Problem Relation Age of Onset   Hypertension Mother    Atrial fibrillation Mother    Arthritis Mother    Lupus Father    COPD Father    Rheum arthritis Father    Sarcoidosis Father    Lupus Sister    Congestive Heart Failure Maternal Grandmother    Migraines Neg Hx    Headache Neg Hx      SOCIAL HISTORY: Social History   Socioeconomic History   Marital status: Married    Spouse name: Not on file   Number of children: 4   Years of education: Not on file   Highest education level: Not on file  Occupational History    Employer: [employer]    Comment: corporate safety  Tobacco Use   Smoking status: Never    Passive exposure: Never   Smokeless tobacco: Never  Vaping Use   Vaping status: Never Used  Substance and Sexual Activity   Alcohol use: No    Alcohol/week: 0.0 standard drinks of alcohol   Drug use: Never   Sexual activity: Yes    Birth control/protection: Surgical  Other Topics Concern   Not on file  Social History Narrative   Lives with husband and oldest son    Caffeine use: about 3 cups of  coffee/day   She works in Geophysicist/field seismologist with [employer]   She has 4 children age 82-22.     Social Drivers of Corporate investment banker Strain: Not on file  Food Insecurity: Not on file  Transportation Needs: Not on file  Physical Activity: Not on file  Stress: Not on file  Social Connections: Not on file  Intimate Partner Violence: Not on file      PHYSICAL EXAM  There were no vitals filed for this visit.   There is no height or weight on file to calculate BMI.   Generalized: Well developed, in no acute distress   Neurological examination  Mentation: Alert oriented to time, place, history taking. Follows all commands speech and language fluent Cranial nerve II-XII: Pupils were equal round reactive to light. Extraocular movements were full, visual field were full on confrontational test. Facial sensation and strength were normal. Uvula tongue midline. Head turning and shoulder shrug  were normal and symmetric. Motor: The motor testing reveals 5 over 5 strength of all 4 extremities. Good symmetric motor tone is noted throughout.  Sensory: Sensory testing is intact to soft touch on all 4 extremities. No evidence of extinction is noted.  Gait and station: Gait is arthritic, stable without assistive device   DIAGNOSTIC DATA (LABS, IMAGING, TESTING) - I reviewed patient records, labs, notes, testing and imaging myself where available.  Lab Results  Component Value Date   WBC 6.7 02/08/2024   HGB 13.2 02/08/2024   HCT 41.2 02/08/2024   MCV 90.4 02/08/2024   PLT 350 02/08/2024      Component Value Date/Time   NA 140 02/08/2024 1825   NA 141 12/07/2023 0916   K 3.2 (L) 02/08/2024 1825   CL 111 02/08/2024 1825   CO2 23 02/08/2024 1825   GLUCOSE 129 (H) 02/08/2024 1825   BUN 14 02/08/2024 1825   BUN 12 12/07/2023 0916   CREATININE 0.95 02/08/2024 1825   CREATININE 0.91 09/24/2022 0955   CALCIUM  9.2 02/08/2024 1825   PROT 7.3 12/07/2023 0916   ALBUMIN 4.6  12/07/2023 0916   AST 14 12/07/2023 0916   ALT 13 12/07/2023 0916   ALKPHOS 72 12/07/2023 0916   BILITOT 0.4 12/07/2023 0916   GFRNONAA >60 02/08/2024 1825   GFRNONAA 87 08/01/2019 1530   GFRAA >60 04/12/2020 1232   GFRAA 100 08/01/2019 1530   Lab Results  Component Value Date   CHOL 165 04/26/2016   HDL 41 04/26/2016   LDLCALC 88 04/26/2016   TRIG 181 (H) 04/26/2016   CHOLHDL 4.0 04/26/2016   Lab Results  Component Value Date   HGBA1C 5.8 (H) 04/26/2016   Lab Results  Component Value Date   VITAMINB12 345 07/31/2021   Lab Results  Component Value Date   TSH 0.880 07/11/2020      ASSESSMENT AND PLAN  58 y.o. year old female  has a past medical history of Anxiety, Arthritis, Asthma, Cervical hyperplasia, Chest pain, Crohn disease (HCC), Diverticulitis of colon (07/17/2014), Diverticulosis, Dyspnea, GERD (gastroesophageal reflux disease), Hiatal hernia, History  of iron deficiency anemia, Hypertension, Idiopathic intracranial hypertension, Migraines, Obesity, Palpitations, Pneumonia (2008), PONV (postoperative nausea and vomiting), Sleep apnea (sleep study 11/03/2009), and Vitamin B deficiency. here with   No diagnosis found.  Shawntelle repots that headaches are about 50% improved on Ajovy . Although she continues to have a mild tension headache most days, she does not feel these are bothersome. She does feel that migraines were better managed on Ajovy . I will continue Ajovy  every 30 days. Failed Emgality . Amovig and Qulipta contraindicated due to chronic constipation. We will continue acetazolamide  500/1000mg , amitriptyline  25mg  QHS and rizatriptan  PRN. Consider Botox in future. We have discussed referral to sleep med for history of OSA. She declines at this time. Healthy lifestyle habits encouraged. Follow-up recommended in 6 months, sooner if needed. She verbalizes understanding and agreement with this plan.   I spent 30 minutes of face-to-face and non-face-to-face time with  patient.  This included previsit chart review, lab review, study review, order entry, electronic health record documentation, patient education.   Greig Forbes, MSN, FNP-C 03/28/2024, 2:56 PM  [facility] [address] [place], [place] [address] [phone]

## 2024-03-30 ENCOUNTER — Ambulatory Visit: Payer: Commercial Managed Care - PPO | Admitting: Family Medicine

## 2024-03-30 DIAGNOSIS — Z8669 Personal history of other diseases of the nervous system and sense organs: Secondary | ICD-10-CM

## 2024-03-30 DIAGNOSIS — G932 Benign intracranial hypertension: Secondary | ICD-10-CM

## 2024-03-30 DIAGNOSIS — G43709 Chronic migraine without aura, not intractable, without status migrainosus: Secondary | ICD-10-CM

## 2024-04-05 ENCOUNTER — Other Ambulatory Visit (INDEPENDENT_AMBULATORY_CARE_PROVIDER_SITE_OTHER): Payer: Self-pay | Admitting: Gastroenterology

## 2024-04-05 ENCOUNTER — Other Ambulatory Visit: Payer: Self-pay | Admitting: *Deleted

## 2024-04-05 ENCOUNTER — Other Ambulatory Visit (HOSPITAL_COMMUNITY): Payer: Self-pay

## 2024-04-05 ENCOUNTER — Other Ambulatory Visit: Payer: Self-pay

## 2024-04-05 DIAGNOSIS — R110 Nausea: Secondary | ICD-10-CM

## 2024-04-05 DIAGNOSIS — G932 Benign intracranial hypertension: Secondary | ICD-10-CM

## 2024-04-05 MED ORDER — RIZATRIPTAN BENZOATE 10 MG PO TBDP
10.0000 mg | ORAL_TABLET | ORAL | 3 refills | Status: DC | PRN
  Filled 2024-04-05: qty 9, 28d supply, fill #0

## 2024-04-05 MED ORDER — ACETAZOLAMIDE ER 500 MG PO CP12
ORAL_CAPSULE | ORAL | 0 refills | Status: DC
  Filled 2024-04-05: qty 270, 90d supply, fill #0

## 2024-04-05 MED ORDER — AJOVY 225 MG/1.5ML ~~LOC~~ SOAJ
225.0000 mg | SUBCUTANEOUS | 0 refills | Status: DC
  Filled 2024-04-05: qty 1.5, 30d supply, fill #0
  Filled 2024-04-11: qty 1.5, 30d supply, fill #1

## 2024-04-05 MED ORDER — ONDANSETRON 4 MG PO TBDP
4.0000 mg | ORAL_TABLET | Freq: Three times a day (TID) | ORAL | 1 refills | Status: AC | PRN
Start: 2024-04-05 — End: ?
  Filled 2024-04-05: qty 90, 30d supply, fill #0

## 2024-04-05 MED ORDER — FAMOTIDINE 40 MG PO TABS
ORAL_TABLET | ORAL | 3 refills | Status: AC
  Filled 2024-04-05: qty 90, 90d supply, fill #0
  Filled 2024-07-26: qty 90, 90d supply, fill #1

## 2024-04-05 MED ORDER — OMEPRAZOLE 40 MG PO CPDR
40.0000 mg | DELAYED_RELEASE_CAPSULE | Freq: Every day | ORAL | 3 refills | Status: AC
  Filled 2024-04-05: qty 90, 90d supply, fill #0
  Filled 2024-07-26: qty 90, 90d supply, fill #1
  Filled 2024-11-10: qty 90, 90d supply, fill #2

## 2024-04-05 NOTE — Telephone Encounter (Signed)
 Last seen on 03/24/23 Follow up scheduled on 07/26/24

## 2024-04-06 ENCOUNTER — Telehealth: Payer: Self-pay | Admitting: Family Medicine

## 2024-04-06 ENCOUNTER — Other Ambulatory Visit: Payer: Self-pay

## 2024-04-06 ENCOUNTER — Other Ambulatory Visit (HOSPITAL_COMMUNITY): Payer: Self-pay

## 2024-04-06 NOTE — Telephone Encounter (Signed)
 Pt reports she has been told that acetaZOLAMIDE  ER (DIAMOX ) 500 MG capsule  & the Ajovy  have been discontinued, this is concerning and pt is asking for a call to discuss what will be done for the treatment of the fluid on her brain

## 2024-04-06 NOTE — Telephone Encounter (Signed)
 I called and spoke pharmacy and was told that since Rx was refilled that make the old Rx be d/c'd pt is able to see this.   Ajovy  doesn't need a PA she was able to use coupon. Pharmacy said they will work on getting medication filled.   Pt aware of this as well.

## 2024-04-11 ENCOUNTER — Other Ambulatory Visit (HOSPITAL_COMMUNITY): Payer: Self-pay

## 2024-04-17 ENCOUNTER — Telehealth (INDEPENDENT_AMBULATORY_CARE_PROVIDER_SITE_OTHER): Payer: Self-pay

## 2024-04-17 ENCOUNTER — Ambulatory Visit (INDEPENDENT_AMBULATORY_CARE_PROVIDER_SITE_OTHER): Admitting: Gastroenterology

## 2024-04-17 ENCOUNTER — Encounter (INDEPENDENT_AMBULATORY_CARE_PROVIDER_SITE_OTHER): Payer: Self-pay | Admitting: Gastroenterology

## 2024-04-17 VITALS — BP 137/89 | HR 86 | Temp 97.3°F | Ht 71.0 in | Wt 273.0 lb

## 2024-04-17 DIAGNOSIS — Z79620 Long term (current) use of immunosuppressive biologic: Secondary | ICD-10-CM

## 2024-04-17 DIAGNOSIS — K589 Irritable bowel syndrome without diarrhea: Secondary | ICD-10-CM | POA: Insufficient documentation

## 2024-04-17 DIAGNOSIS — K50019 Crohn's disease of small intestine with unspecified complications: Secondary | ICD-10-CM

## 2024-04-17 NOTE — Telephone Encounter (Signed)
 Thanks

## 2024-04-17 NOTE — Telephone Encounter (Signed)
 Patient has her infusions (Entyvio ) and blood draws with [facility]. Per Dr. Eartha call [facility] and cancel the blood draws.

## 2024-04-17 NOTE — Patient Instructions (Addendum)
 Continue Entyvio  every 8 weeks Continue with Levsin  as needed, can take it up to 4 times per day to control abdominal pain and bloating Explained presumed etiology of IBS symptoms. Patient was counseled about the benefit of implementing a low FODMAP to improve symptoms and recurrent episodes. A dietary list was provided to the patient. Also, the patient was counseled about the benefit of avoiding stressing situations and potential environmental triggers leading to symptomatology. If persistent symptoms of bloating, can take FDGard as needed

## 2024-04-17 NOTE — Telephone Encounter (Signed)
 I spoke with Alline at [facility] [phone], she says she will make a note in the patient's chart stating to stop the blood draws there.

## 2024-04-17 NOTE — Progress Notes (Unsigned)
 Toribio Fortune, M.D. Gastroenterology & Hepatology [facility] [address] [place], [place] [address]  Primary Care Physician: Marvine Rush, MD [address] [place] [address]  I will communicate my assessment and recommendations to the referring MD via EMR.  Problems: Small bowel Crohn's disease Overlapping IBS  History of Present Illness: [name] is a 58 y.o. female with a history of asthma, small bowel Crohn's disease , GERD, hypertension, obesity, diverticulosis and presumed diverticulitis, who comes for SB Crohn's disease.  The patient was last seen on 12/07/2023. At that time, the patient was proceed with MR enterography.  Patient reports that she has some episodes of tenderness in the right lower side of the abdomen. She is having the tenderness in her abdomen most of the day.  She has been feeling more sensitive to strong smells  which makes her nauseated but does not vomit. She has tried to implement a more antiinflammatory diet  - has tried Keto diet as much as possible as this may help her symptoms. She has some bloating intermittently. Usually has 1-3 Bms per day. TakesLevsin as needed for abdominal pain, which helps.  Patient has had Entyvio  every 8 weeks.  She takes omeprazole  40 mg qday and famotidine  40 mg at night for GERD.Has not had more GERD in the last week.  The patient denies having any nausea, vomiting, fever, chills, hematochezia, melena, hematemesis, diarrhea, jaundice, pruritus . Has been gaining some weight recently.  Last imaging was MR enterography on 12/13/2023 which showed descending and sigmoid diverticulosis but no presence of inflammation anywhere in the bowel.  Levels of vedolizumab  on 3/27/2025Were adequate with a value of 7.1, negative antibodies  Last time TB and hepatitis B surface antigen were checked: 12/07/2023  Last Colonoscopy:03/25/22- One 4 mm polyp in the cecum, removed  with a cold snare.-tubular adenoma  - Melanosis in the colon. - Diverticulosis in the entire examined colon. - The distal rectum and anal verge are normal on retroflexion view.  Recommend a repeat colonoscopy in 5 years  Last Endoscopy:03/25/22- 3 cm hiatal hernia. - Non-obstructing Schatzki ring. - Normal stomach. - Normal examined Normal examined duodenum, biopsied-mild inflammation   Recommendations:  Repeat colon in 5 years  Past Medical History: Past Medical History:  Diagnosis Date   Anxiety    Arthritis    Asthma    Cervical hyperplasia    hisotry of   Chest pain    cath 2005 norm cors, repeat cath Feb 2011 normal cors and only minimally  elevated pulmonary pressures   Crohn disease (HCC)    Diverticulitis of colon 07/17/2014   Diverticulosis    Dyspnea    GERD (gastroesophageal reflux disease)    Hiatal hernia    History of iron deficiency anemia    Hypertension    Idiopathic intracranial hypertension    Per patient   Migraines    Obesity    Palpitations    Pneumonia 2008   14 days hospitalization, bilateral   PONV (postoperative nausea and vomiting)    Sleep apnea sleep study 11/03/2009    cpap; does sometimes, doesnt use every night. weight loss no longer using CPAP   Vitamin B deficiency     Past Surgical History: Past Surgical History:  Procedure Laterality Date   ABDOMINAL HYSTERECTOMY     ANKLE ARTHROSCOPY  06/01/2012   Procedure: ANKLE ARTHROSCOPY;  Surgeon: Deward DELENA Schwartz, MD;  Location: [facility];  Service: Orthopedics;  Laterality:  Left;  left ankle arthorsocpy with extensive debridement and gastroc slide   ANKLE SURGERY Left 05/11/2018   BIOPSY  04/26/2019   Procedure: BIOPSY;  Surgeon: Golda Claudis PENNER, MD;  [facility];  Service: Endoscopy;;  ileum colon   BIOPSY  03/25/2022   Procedure: BIOPSY;  Surgeon: Eartha Angelia Sieving, MD;  Location: [facility];  Service: Gastroenterology;;   CARDIAC  CATHETERIZATION  11/22/2009 and 2005   WNL   CESAREAN SECTION     CHOLECYSTECTOMY  09/08/2006   lap. chole.   CHONDROPLASTY  06/17/2012   Procedure: CHONDROPLASTY;  Surgeon: Taft FORBES Minerva, MD;  Location: [facility];  Service: Orthopedics;  Laterality: Right;  right patella   COLONOSCOPY N/A 04/26/2019   Rehman: a few erosions in ternimal ileum, two erosions in cecum (assocaited with acute inflammation) diverticulosis in sigmoid hepatic flexure and ascending colon, external hemorrhoids   COLONOSCOPY WITH PROPOFOL  N/A 03/25/2022   Procedure: COLONOSCOPY WITH PROPOFOL ;  Surgeon: Eartha Angelia Sieving, MD;  [facility];  Service: Gastroenterology;  Laterality: N/A;  830 ASA 1   ESOPHAGOGASTRODUODENOSCOPY N/A 05/14/2015   Procedure: ESOPHAGOGASTRODUODENOSCOPY (EGD);  Surgeon: Claudis PENNER Golda, MD;  [facility];  Service: Endoscopy;  Laterality: N/A;  730   ESOPHAGOGASTRODUODENOSCOPY (EGD) WITH PROPOFOL  N/A 03/25/2022   Procedure: ESOPHAGOGASTRODUODENOSCOPY (EGD) WITH PROPOFOL ;  Surgeon: Eartha Angelia Sieving, MD;  [facility];  Service: Gastroenterology;  Laterality: N/A;   GIVENS CAPSULE STUDY N/A 05/24/2019   Procedure: GIVENS CAPSULE STUDY;  Surgeon: Golda Claudis PENNER, MD;  [facility]: [facility];  Service: Endoscopy;  Laterality: N/A;  7:30   INGUINAL HERNIA REPAIR  10/30/2008   right   KNEE ARTHROSCOPY Right 2013   KNEE ARTHROSCOPY WITH LATERAL MENISECTOMY Left 12/23/2016   Procedure: LEFT KNEE ARTHROSCOPY WITH LATERAL MENISECTOMY;  Surgeon: Taft FORBES Minerva, MD;  Location: [facility];  Service: Orthopedics;  Laterality: Left;   KNEE ARTHROSCOPY WITH MEDIAL MENISECTOMY Left 12/23/2016   Procedure: LEFT KNEE ARTHROSCOPY WITH MEDIAL MENISECTOMY CHONDROPLASTY PATELLA  AND MEDIAL FEMORAL CONDYLE LEFT KNEE;  Surgeon: Taft FORBES Minerva, MD;  Location: [facility];  Service: Orthopedics;  Laterality: Left;   POLYPECTOMY  03/25/2022   Procedure: POLYPECTOMY;   Surgeon: Eartha Angelia Sieving, MD;  [facility];  Service: Gastroenterology;;   SHOULDER ARTHROSCOPY WITH SUBACROMIAL DECOMPRESSION AND BICEP TENDON REPAIR Left 12/21/2017   Procedure: Left shoulder arthroscopic biceps tenodesis, SAD, DCR and labrum debridement;  Surgeon: Sharl Selinda Dover, MD;  Location: [facility];  Service: Orthopedics;  Laterality: Left;  120 mins   SHOULDER SURGERY     right x 2    TOTAL KNEE ARTHROPLASTY Left 06/06/2019   Procedure: TOTAL KNEE ARTHROPLASTY;  Surgeon: Ernie Cough, MD;  Location: [facility];  Service: Orthopedics;  Laterality: Left;  70 mins   TOTAL KNEE ARTHROPLASTY Right 01/23/2021   Procedure: TOTAL KNEE ARTHROPLASTY;  Surgeon: Ernie Cough, MD;  Location: [facility];  Service: Orthopedics;  Laterality: Right;  70 mins    Family History: Family History  Problem Relation Age of Onset   Hypertension Mother    Atrial fibrillation Mother    Arthritis Mother    Lupus Father    COPD Father    Rheum arthritis Father    Sarcoidosis Father    Lupus Sister    Congestive Heart Failure Maternal Grandmother    Migraines Neg Hx    Headache Neg Hx     Social History: Social History  Tobacco Use  Smoking Status Never   Passive exposure: Never  Smokeless Tobacco Never   Social History   Substance and Sexual Activity  Alcohol Use No   Alcohol/week: 0.0 standard drinks of alcohol   Social History   Substance and Sexual Activity  Drug Use Never    Allergies: Allergies  Allergen Reactions   Bee Pollen Anaphylaxis, Other (See Comments) and Swelling    Other Reaction(s): Other (See Comments)  bee pollen   Hydromorphone  Hcl Other (See Comments) and Nausea And Vomiting    Other Reaction(s): Not available  hydromorphone   Other Reaction(s): headache    hydromorphone    Iodinated Contrast Media Anaphylaxis, Hives, Itching and Nausea Only    Pt. Was premedicated with emergent protocol.   Other Shortness Of Breath    Lemon grass    Wasp Venom Anaphylaxis and Shortness Of Breath   Pollen Extract Swelling   Adhesive [Tape] Rash   Omnipaque  [Iohexol ] Hives and Nausea Only    Pt. Was premedicated with emergent protocol.    Medications: Current Outpatient Medications  Medication Sig Dispense Refill   acetaminophen  (TYLENOL ) 500 MG tablet Take 2 tablets (1,000 mg total) by mouth every 8 (eight) hours. (Patient taking differently: Take 1,000 mg by mouth every 8 (eight) hours. As needed.) 30 tablet 0   acetaZOLAMIDE  ER (DIAMOX ) 500 MG capsule Take 1 capsule (500 mg total) by mouth every morning AND 2 capsules (1,000 mg total) every evening. 270 capsule 0   albuterol  (PROVENTIL ) (2.5 MG/3ML) 0.083% nebulizer solution Inhale the contents of 1 vial via nebulizer every 6 hours as needed 90 mL 1   albuterol  (VENTOLIN  HFA) 108 (90 Base) MCG/ACT inhaler Inhale 1-2 puffs by mouth every 4 hours as needed 6.7 g 9   amitriptyline  (ELAVIL ) 25 MG tablet TAKE 1 TABLET BY MOUTH AT BEDTIME 90 tablet 3   amLODipine  (NORVASC ) 5 MG tablet Take 1 tablet (5 mg total) by mouth daily. 90 tablet 2   Cyanocobalamin  (VITAMIN B-12 IJ) Inject 1 Dose as directed every 30 (thirty) days.     cyclobenzaprine  (FLEXERIL ) 5 MG tablet Take 1 tablet (5 mg total) by mouth every 8 (eight) hours as needed for muscle spasms. 40 tablet 2   diclofenac  Sodium (VOLTAREN ) 1 % GEL Apply 1 Application topically 4 (four) times daily as needed (pain).     diphenhydrAMINE  (BENADRYL ) 25 MG tablet Take 25-50 mg by mouth every 6 (six) hours as needed for allergies.     EPINEPHrine  (EPIPEN  2-PAK) 0.3 mg/0.3 mL IJ SOAJ injection Inject as directed for anaphylaxis. (Patient taking differently: as needed.) 2 each 5   famotidine  (PEPCID ) 40 MG tablet Take 1 tablet by mouth at bedtime. Needs office visit. 90 tablet 3   fluticasone  (FLONASE ) 50 MCG/ACT nasal spray Place 2 sprays into both nostrils daily. (Patient taking differently: Place 2 sprays into both nostrils daily. As needed.)  16 g 6   Fremanezumab -vfrm (AJOVY ) 225 MG/1.5ML SOAJ Inject 225 mg into the skin every 30 (thirty) days. 4.5 mL 0   hyoscyamine  (LEVSIN  SL) 0.125 MG SL tablet Dissolve 1 tablet under the tongue 3 times daily before meals. (Patient taking differently: Place 0.125 mg under the tongue 3 (three) times daily before meals. As needed.) 90 tablet 1   loratadine  (CLARITIN ) 10 MG tablet Take 10 mg by mouth at bedtime.     losartan  (COZAAR ) 100 MG tablet Take 1 tablet (100 mg total) by mouth daily. 90 tablet 1   meclizine  (ANTIVERT ) 25 MG  tablet Take 25 mg by mouth 3 (three) times daily as needed for dizziness.     montelukast  (SINGULAIR ) 10 MG tablet Take 1 tablet (10 mg total) by mouth daily. 90 tablet 3   omeprazole  (PRILOSEC) 40 MG capsule Take 1 capsule (40 mg total) by mouth daily. 90 capsule 3   ondansetron  (ZOFRAN -ODT) 4 MG disintegrating tablet Dissolve 1 tablet by mouth every 8 hours as needed for nausea or vomiting. 90 tablet 1   polyethylene glycol (MIRALAX  / GLYCOLAX ) 17 g packet Take 17 g by mouth daily. (Patient taking differently: Take 17 g by mouth as needed.)     rizatriptan  (MAXALT -MLT) 10 MG disintegrating tablet Dissolve 1 tablet (10 mg total) by mouth as needed for migraine. May repeat in 2 hours if needed 9 tablet 3   tamsulosin  (FLOMAX ) 0.4 MG CAPS capsule Take 1 capsule (0.4 mg total) by mouth daily. (Patient taking differently: Take 0.4 mg by mouth as needed.) 30 capsule 2   vedolizumab  (ENTYVIO ) 300 MG injection Inject 300 mg into the vein every 8 (eight) weeks.     No current facility-administered medications for this visit.    Review of Systems: GENERAL: negative for malaise, night sweats HEENT: No changes in hearing or vision, no nose bleeds or other nasal problems. NECK: Negative for lumps, goiter, pain and significant neck swelling RESPIRATORY: Negative for cough, wheezing CARDIOVASCULAR: Negative for chest pain, leg swelling, palpitations, orthopnea GI: SEE  HPI MUSCULOSKELETAL: Negative for joint pain or swelling, back pain, and muscle pain. SKIN: Negative for lesions, rash PSYCH: Negative for sleep disturbance, mood disorder and recent psychosocial stressors. HEMATOLOGY Negative for prolonged bleeding, bruising easily, and swollen nodes. ENDOCRINE: Negative for cold or heat intolerance, polyuria, polydipsia and goiter. NEURO: negative for tremor, gait imbalance, syncope and seizures. The remainder of the review of systems is noncontributory.   Physical Exam: BP (!) 156/89 (BP Location: Left Arm, Patient Position: Sitting, Cuff Size: Large)   Pulse 81   Temp (!) 97.3 F (36.3 C) (Temporal)   Ht 5' 11 (1.803 m)   Wt 273 lb (123.8 kg)   BMI 38.08 kg/m  GENERAL: The patient is AO x3, in no acute distress. HEENT: Head is normocephalic and atraumatic. EOMI are intact. Mouth is well hydrated and without lesions. NECK: Supple. No masses LUNGS: Clear to auscultation. No presence of rhonchi/wheezing/rales. Adequate chest expansion HEART: RRR, normal s1 and s2. ABDOMEN: Soft, nontender, no guarding, no peritoneal signs, and nondistended. BS +. No masses. RECTAL EXAM: no external lesions, normal tone, no masses, brown stool without blood.*** Chaperone: EXTREMITIES: Without any cyanosis, clubbing, rash, lesions or edema. NEUROLOGIC: AOx3, no focal motor deficit. SKIN: no jaundice, no rashes  Imaging/Labs: as above  I personally reviewed and interpreted the available labs, imaging and endoscopic files.  Impression and Plan: [name] is a 58 y.o. female coming for follow up of ***   All questions were answered.      Toribio Fortune, MD Gastroenterology and Hepatology [facility]

## 2024-04-20 DIAGNOSIS — K5000 Crohn's disease of small intestine without complications: Secondary | ICD-10-CM | POA: Diagnosis not present

## 2024-04-21 ENCOUNTER — Telehealth: Payer: Self-pay

## 2024-04-21 NOTE — Telephone Encounter (Signed)
 Pharmacy Patient Advocate Encounter   Received notification from Fax that prior authorization for Ajovy  is due for renewal.   Insurance verification completed.   The patient is insured through [employer] [facility] [employer]  Action: Patient hasn't been seen in your office in over a year. Plan requires updated chart notes for PA renewal.

## 2024-04-27 NOTE — Progress Notes (Signed)
 Chief Complaint  Patient presents with   Follow-up    Pt in room 1. Alone.Here for migraine follow up. Pt said migraines are doing decent. Last migraine last week. Averages about 2 per month. Pt said this month she noticed more frequent migraines.    HISTORY OF PRESENT ILLNESS:  03/30/2024 ALL: Pev returns for follow up for migraines. She was last seen 03/2023. We  continued Ajovy  monthly, acetazolamide  500/1000mg , amitriptyline  25mg  QHS and rizatriptan  PRN. Since, she reports doing well. She missed 1 injection and had stopped acetazolamide  for a period of time about a month ago and reports worsening headache and pressure. Once resuming medicaitons, symptoms improved. She may have 1-2 migraines per month. Rizatriptan  works well. She continues close follow up with GI. She has not noted any significant changes in gut functioning on Ajovy . She is working on increasing activity and eating a low inflammatory diet. She could not tolerate CPAP therapy in past.   03/24/2023 ALL: Pev returns for follow up for migraines. She was last seen 09/2022. We switched from Nurtec to Ajovy  and continued acetazolamide  500/1000mg , amitriptyline   and rizatriptan  PRN. Since, she reports headaches have improved. She was having 4-8 migraines per month, now having 2-3. Rizatriptan  seems to work well. She does have pressure in the back of head. Recent eye exam reportedly normal. BP is well managed. She has a history of sleep apnea but did not like CPAP therapy. She had lost weight but has gained it back. She is hoping to see ENT to discuss tonsillectomy due to chronic tonsil stones.   09/15/2022 ALL: Pev returns for follow up for migraines and IIH. She was last seen 03/2022. We switched from Emgality  back to Ajovy . Ajovy  was not approved and we switched to Nurtec QOD. She continued amitriptyline  25mg  QHS, acetazolamide  500 TID and rizatriptan  PRN. She reports no significant change in headaches. She reports near daily headaches.  She has to take rizatriptan  1-2 times a week. Eye exam was unremarkable about 2 months ago.   03/11/2022 ALL: Kassity returns for follow up for migraines and IIH. She was last seen 03/2021 and doing well on Emgality , amitriptyline  25mg  QHS, acetazolamide  500 BID and rizatriptan  PRN. She called 06/2021 with worsening tension headaches and advised to increase acetazolamide  to 500/1000. Since, she feels that she was doing a little better for a couple of months but recently having more headaches. She continues to have near daily headaches. She has about 5-10 migraines per month. Right eye is sensitive to light. She continues to feel pressure in right eye and has some pressure. Last eye exam was about this time last year.  Rizatriptan  does work for abortive therapy. She also uses complementary therapy. BP has been better. She was recently started Entyvio  injections for Chron's. She has gained some weight.   03/11/2021 ALL:  Blythe returns for follow up for migraines and IIH. She was taking Ajovy , amitriptyline , acetazolamide  and rizatriptan . Ajovy  was no longer covered on insurance and she was switched to Emgality . She has had 1 injection. She is tolerating acetazolamide . Amitriptyline  helps with sleep. She feels that headaches are well managed. She does have some sort of headache almost every day. She takes rizatriptan  maybe once a month. Some months she doesn't have to take it. She had right TKR 01/23/2021. She took oxycodone  post surgery but has not taken regularly. She has tried to wean gabapentin . She is having more low back pain and nerve pain of right leg. She called ortho for  a refill of gabapentin .   10/14/2020 AA: Interval history October 13, 2020(last seen by myself 9/17 for appointment and [id number]/72021 for emg/ncs and also seen by NP Clarice Zulauf 07/29/2020: Patient is here for a follow-up of migraines and other multiple neurologic symptoms.  At last appointment she was started on Ajovy  and amitriptyline  for her  migraines, Diamox  after LP with elevated opening pressure, rizatriptan  for acute migraine management, and an EMG nerve conduction study was performed which did not show anything significant to explain all her other multiple neurologic symptoms(See below) in the setting of mood disorder which may be contributory.  Per Andy Moye the rizatriptan  was helping, amitriptyline  was helping her sleep.  She also endorsed some decreased intensity of headaches and migraines to starting Ajovy , amitriptyline  and Diamox .  Yoshiko Keleher continued current treatment.  Since last being seen she was seen by ophthalmology.  Neuro exam is nonfocal.   Her headaches and migraines are feeling better. She still has the pressure in the head but improved. Increase Diamox , keep her on the Ajovy .   07/29/2020 ALL:  TAKEIRA YANES is a 58 y.o. female here today for follow up for headaches. She was started on Ajovy  and amitriptyline  for migraine prevention as well as rizatriptan  for abortive therapy in 06/2020. MRI showed concerns for partially empty sella and she was started on Diamox  250mg  BID. She had a LP on 07/17/2020 and started on Diamox  07/18/2020. She called 07/22/2020 reporting symptoms of increased pressure and pulsating of head with bilateral lower back pain (not at site of LP). Symptoms lasted for about 2-3 hours. She felt that she was having a hard time walking. She states that head pressure comes and goes. She has taken rizatriptan  three times and it does abort migraine. She feels that she has brain fog at times. She has noted some tingling of feet. She is sleeping better after starting amitriptyline .   Low back continues with any prolonged standing. She has numbness and tingling of her lower extremities, worse on left. She reports her feet feel like they are going to sleep. NCS/EMG showed possible remote lumbosacral radiculopathy. She is was previously seeing Dr Dolphus for similar symptoms. Referral was placed back to Dr  Dolphus.   HISTORY (copied from Dr Sharion note on 06/21/2020)  HPI:  RISSA TURLEY is a 58 y.o. female here as requested by Marvine Rush, MD for headaches.  Past medical history obesity, headache, vitamin D  deficiency.  I reviewed Dr. Bertha notes which provided no significant history of patient's chief complaint.  She is here today alone, she has been having headaches, started sometime in May, she had a physical at the end of the May and discussed headaches have been going away, also tired no energy, found out she was B12 deficiency and started shots per month.  Headaches are minute, can also light sensitivity and nausea, she has some GI issues, Crohn's, has been on steroids.  She also reports nerve pain and stiffness in her hands and feet, she stopped using CPAP due to losing weight and long longer having any problems. She has been having headaches she has pressure in the back of her head at the occipital area and down to her neck, she has tried massage. They start in over the eyes as well. When they are really intense, she is sensitive to light, she has pressure in the back, nausea, pulsating/pulsing/throbbing. She is having the migraines at least 4x a week, they never really go away they just  minimize, she will wake up with headaches, she had lost about 40 pounds and she has OSA in the past. She has gained 25 pounds since March, she doesn't sleep, ongoing for about a year after she found her brother dead in August 11, 2024. She is fatigued, she has sleep issues. She staets she doesn't sleep all night and go without any sleep for 3 day - she says literally no sleep she turns a lot.  She has vision changes, headache are positional worse supinein the morning, morning headaches. She has muscle cramps, lots of stress. No other focal neurologic deficits, associated symptoms, inciting events or modifiable factors.   Reviewed notes, labs and imaging from outside physicians, which showed:   MRI brain 2017:  personally reviewed images and agree with the following:  Brain: No diffusion signal abnormality. Stable partially empty sella turcica. No significant T2 FLAIR signal abnormality. No abnormal enhancement. No abnormal susceptibility hypointensity to indicate intracranial hemorrhage. No focal mass effect. No extra-axial collection. Normal ventricle size.   MRA H/N 09/2016 reviewed reports/findings:   1. No acute intracranial abnormality. Normal MRI of the brain for age. 2. Patent circle of Willis. No large vessel occlusion, aneurysm, or significant stenosis is identified. 3. Patent carotid and vertebral arteries of the neck. No dissection, aneurysm, or significant stenosis is identified.   MRI cervical spine 01/08/2012:  personally reviewed images and agree with the following: 1. Tiny left paracentral disc protrusion at C5-6 without significant  canal or neural foraminal stenosis. Finding is stable relative to  previous MRI from 05/28/2007.  2. Mild degenerative disc disease at C4-5 without canal or foraminal  stenosis.  3. Otherwise unremarkable MRI of the cervical spine.    CBC/CMP: normal 04/12/2020   Amlodipine , tylenol , aspirin , flexeril , gabapentin , ibuprofen , toradol  injection, magnesium , losartan , meclizine , mobic , reglan , zofran , tramadol , topiramate   REVIEW OF SYSTEMS: Out of a complete 14 system review of symptoms, the patient complains only of the following symptoms, headaches, and all other reviewed systems are negative.   ALLERGIES: Allergies  Allergen Reactions   Bee Pollen Anaphylaxis, Other (See Comments) and Swelling    Other Reaction(s): Other (See Comments)  bee pollen   Hydromorphone  Hcl Other (See Comments) and Nausea And Vomiting    Other Reaction(s): Not available  hydromorphone   Other Reaction(s): headache    hydromorphone    Iodinated Contrast Media Anaphylaxis, Hives, Itching and Nausea Only    Pt. Was premedicated with emergent protocol.   Other  Shortness Of Breath    Lemon grass   Wasp Venom Anaphylaxis and Shortness Of Breath   Pollen Extract Swelling   Adhesive [Tape] Rash   Omnipaque  [Iohexol ] Hives and Nausea Only    Pt. Was premedicated with emergent protocol.     HOME MEDICATIONS: Outpatient Medications Prior to Visit  Medication Sig Dispense Refill   acetaminophen  (TYLENOL ) 500 MG tablet Take 2 tablets (1,000 mg total) by mouth every 8 (eight) hours. (Patient taking differently: Take 1,000 mg by mouth every 8 (eight) hours. As needed.) 30 tablet 0   acetaZOLAMIDE  ER (DIAMOX ) 500 MG capsule Take 1 capsule (500 mg total) by mouth every morning AND 2 capsules (1,000 mg total) every evening. 270 capsule 0   albuterol  (PROVENTIL ) (2.5 MG/3ML) 0.083% nebulizer solution Inhale the contents of 1 vial via nebulizer every 6 hours as needed 90 mL 1   albuterol  (VENTOLIN  HFA) 108 (90 Base) MCG/ACT inhaler Inhale 1-2 puffs by mouth every 4 hours as needed 6.7 g 9   amitriptyline  (ELAVIL )  25 MG tablet TAKE 1 TABLET BY MOUTH AT BEDTIME 90 tablet 3   amLODipine  (NORVASC ) 5 MG tablet Take 1 tablet (5 mg total) by mouth daily. 90 tablet 2   Cyanocobalamin  (VITAMIN B-12 IJ) Inject 1 Dose as directed every 30 (thirty) days.     cyclobenzaprine  (FLEXERIL ) 5 MG tablet Take 1 tablet (5 mg total) by mouth every 8 (eight) hours as needed for muscle spasms. 40 tablet 2   diclofenac  Sodium (VOLTAREN ) 1 % GEL Apply 1 Application topically 4 (four) times daily as needed (pain).     diphenhydrAMINE  (BENADRYL ) 25 MG tablet Take 25-50 mg by mouth every 6 (six) hours as needed for allergies.     EPINEPHrine  (EPIPEN  2-PAK) 0.3 mg/0.3 mL IJ SOAJ injection Inject as directed for anaphylaxis. (Patient taking differently: as needed.) 2 each 5   famotidine  (PEPCID ) 40 MG tablet Take 1 tablet by mouth at bedtime. Needs office visit. 90 tablet 3   fluticasone  (FLONASE ) 50 MCG/ACT nasal spray Place 2 sprays into both nostrils daily. (Patient taking differently:  Place 2 sprays into both nostrils daily. As needed.) 16 g 6   Fremanezumab -vfrm (AJOVY ) 225 MG/1.5ML SOAJ Inject 225 mg into the skin every 30 (thirty) days. 4.5 mL 0   hyoscyamine  (LEVSIN  SL) 0.125 MG SL tablet Dissolve 1 tablet under the tongue 3 times daily before meals. (Patient taking differently: Place 0.125 mg under the tongue 3 (three) times daily before meals. As needed.) 90 tablet 1   loratadine  (CLARITIN ) 10 MG tablet Take 10 mg by mouth at bedtime.     losartan  (COZAAR ) 100 MG tablet Take 1 tablet (100 mg total) by mouth daily. 90 tablet 1   meclizine  (ANTIVERT ) 25 MG tablet Take 25 mg by mouth 3 (three) times daily as needed for dizziness.     montelukast  (SINGULAIR ) 10 MG tablet Take 1 tablet (10 mg total) by mouth daily. 90 tablet 3   omeprazole  (PRILOSEC) 40 MG capsule Take 1 capsule (40 mg total) by mouth daily. 90 capsule 3   ondansetron  (ZOFRAN -ODT) 4 MG disintegrating tablet Dissolve 1 tablet by mouth every 8 hours as needed for nausea or vomiting. 90 tablet 1   polyethylene glycol (MIRALAX  / GLYCOLAX ) 17 g packet Take 17 g by mouth daily. (Patient taking differently: Take 17 g by mouth as needed.)     rizatriptan  (MAXALT -MLT) 10 MG disintegrating tablet Dissolve 1 tablet (10 mg total) by mouth as needed for migraine. May repeat in 2 hours if needed 9 tablet 3   vedolizumab  (ENTYVIO ) 300 MG injection Inject 300 mg into the vein every 8 (eight) weeks.     tamsulosin  (FLOMAX ) 0.4 MG CAPS capsule Take 1 capsule (0.4 mg total) by mouth daily. (Patient taking differently: Take 0.4 mg by mouth as needed.) 30 capsule 2   No facility-administered medications prior to visit.     PAST MEDICAL HISTORY: Past Medical History:  Diagnosis Date   Anxiety    Arthritis    Asthma    Cervical hyperplasia    hisotry of   Chest pain    cath 2005 norm cors, repeat cath Feb 2011 normal cors and only minimally  elevated pulmonary pressures   Crohn disease (HCC)    Diverticulitis of colon  07/17/2014   Diverticulosis    Dyspnea    GERD (gastroesophageal reflux disease)    Hiatal hernia    History of iron deficiency anemia    Hypertension    Idiopathic intracranial hypertension  Per patient   Migraines    Obesity    Palpitations    Pneumonia 2008   14 days hospitalization, bilateral   PONV (postoperative nausea and vomiting)    Sleep apnea sleep study 11/03/2009    cpap; does sometimes, doesnt use every night. weight loss no longer using CPAP   Vitamin B deficiency      PAST SURGICAL HISTORY: Past Surgical History:  Procedure Laterality Date   ABDOMINAL HYSTERECTOMY     ANKLE ARTHROSCOPY  06/01/2012   Procedure: ANKLE ARTHROSCOPY;  Surgeon: Deward DELENA Schwartz, MD;  Location: [facility];  Service: Orthopedics;  Laterality: Left;  left ankle arthorsocpy with extensive debridement and gastroc slide   ANKLE SURGERY Left 05/11/2018   BIOPSY  04/26/2019   Procedure: BIOPSY;  Surgeon: Golda Claudis PENNER, MD;  [facility];  Service: Endoscopy;;  ileum colon   BIOPSY  03/25/2022   Procedure: BIOPSY;  Surgeon: Eartha Angelia Sieving, MD;  Location: [facility];  Service: Gastroenterology;;   CARDIAC CATHETERIZATION  11/22/2009 and 2005   WNL   CESAREAN SECTION     CHOLECYSTECTOMY  09/08/2006   lap. chole.   CHONDROPLASTY  06/17/2012   Procedure: CHONDROPLASTY;  Surgeon: Taft FORBES Minerva, MD;  Location: [facility];  Service: Orthopedics;  Laterality: Right;  right patella   COLONOSCOPY N/A 04/26/2019   Rehman: a few erosions in ternimal ileum, two erosions in cecum (assocaited with acute inflammation) diverticulosis in sigmoid hepatic flexure and ascending colon, external hemorrhoids   COLONOSCOPY WITH PROPOFOL  N/A 03/25/2022   Procedure: COLONOSCOPY WITH PROPOFOL ;  Surgeon: Eartha Angelia Sieving, MD;  [facility]: [facility];  Service: Gastroenterology;  Laterality: N/A;  830 ASA 1   ESOPHAGOGASTRODUODENOSCOPY N/A 05/14/2015   Procedure:  ESOPHAGOGASTRODUODENOSCOPY (EGD);  Surgeon: Claudis PENNER Golda, MD;  [facility]: [facility];  Service: Endoscopy;  Laterality: N/A;  730   ESOPHAGOGASTRODUODENOSCOPY (EGD) WITH PROPOFOL  N/A 03/25/2022   Procedure: ESOPHAGOGASTRODUODENOSCOPY (EGD) WITH PROPOFOL ;  Surgeon: Eartha Angelia Sieving, MD;  [facility];  Service: Gastroenterology;  Laterality: N/A;   GIVENS CAPSULE STUDY N/A 05/24/2019   Procedure: GIVENS CAPSULE STUDY;  Surgeon: Golda Claudis PENNER, MD;  [facility];  Service: Endoscopy;  Laterality: N/A;  7:30   INGUINAL HERNIA REPAIR  10/30/2008   right   KNEE ARTHROSCOPY Right 2013   KNEE ARTHROSCOPY WITH LATERAL MENISECTOMY Left 12/23/2016   Procedure: LEFT KNEE ARTHROSCOPY WITH LATERAL MENISECTOMY;  Surgeon: Taft FORBES Minerva, MD;  Location: [facility];  Service: Orthopedics;  Laterality: Left;   KNEE ARTHROSCOPY WITH MEDIAL MENISECTOMY Left 12/23/2016   Procedure: LEFT KNEE ARTHROSCOPY WITH MEDIAL MENISECTOMY CHONDROPLASTY PATELLA  AND MEDIAL FEMORAL CONDYLE LEFT KNEE;  Surgeon: Taft FORBES Minerva, MD;  Location: [facility];  Service: Orthopedics;  Laterality: Left;   POLYPECTOMY  03/25/2022   Procedure: POLYPECTOMY;  Surgeon: Eartha Angelia Sieving, MD;  [facility];  Service: Gastroenterology;;   SHOULDER ARTHROSCOPY WITH SUBACROMIAL DECOMPRESSION AND BICEP TENDON REPAIR Left 12/21/2017   Procedure: Left shoulder arthroscopic biceps tenodesis, SAD, DCR and labrum debridement;  Surgeon: Sharl Selinda Dover, MD;  [facility];  Service: Orthopedics;  Laterality: Left;  120 mins   SHOULDER SURGERY     right x 2    TOTAL KNEE ARTHROPLASTY Left 06/06/2019   Procedure: TOTAL KNEE ARTHROPLASTY;  Surgeon: Ernie Cough, MD;  Location: [facility];  Service: Orthopedics;  Laterality: Left;  70 mins   TOTAL KNEE ARTHROPLASTY Right 01/23/2021  Procedure: TOTAL KNEE ARTHROPLASTY;  Surgeon: Ernie Cough, MD;  Location: [facility];  Service: Orthopedics;   Laterality: Right;  70 mins     FAMILY HISTORY: Family History  Problem Relation Age of Onset   Hypertension Mother    Atrial fibrillation Mother    Arthritis Mother    Lupus Father    COPD Father    Rheum arthritis Father    Sarcoidosis Father    Lupus Sister    Congestive Heart Failure Maternal Grandmother    Migraines Neg Hx    Headache Neg Hx      SOCIAL HISTORY: Social History   Socioeconomic History   Marital status: Married    Spouse name: Not on file   Number of children: 4   Years of education: Not on file   Highest education level: Not on file  Occupational History    Employer: [employer]    Comment: corporate safety  Tobacco Use   Smoking status: Never    Passive exposure: Never   Smokeless tobacco: Never  Vaping Use   Vaping status: Never Used  Substance and Sexual Activity   Alcohol use: No    Alcohol/week: 0.0 standard drinks of alcohol   Drug use: Never   Sexual activity: Yes    Birth control/protection: Surgical  Other Topics Concern   Not on file  Social History Narrative   Lives with husband and oldest son    Caffeine use: about 3 cups of coffee/day   She works in Geophysicist/field seismologist with [employer]   She has 4 children age 71-22.     Social Drivers of Corporate investment banker Strain: Not on file  Food Insecurity: Not on file  Transportation Needs: Not on file  Physical Activity: Not on file  Stress: Not on file  Social Connections: Not on file  Intimate Partner Violence: Not on file      PHYSICAL EXAM  Vitals:   05/01/24 1302  BP: (!) 135/90  Pulse: 97  SpO2: 98%  Weight: 268 lb 12.8 oz (121.9 kg)  Height: 5' 11.5 (1.816 m)     Body mass index is 36.97 kg/m.   Generalized: Well developed, in no acute distress   Neurological examination  Mentation: Alert oriented to time, place, history taking. Follows all commands speech and language fluent Cranial nerve II-XII: Pupils were equal round reactive to light.  Extraocular movements were full, visual field were full on confrontational test. Facial sensation and strength were normal. Uvula tongue midline. Head turning and shoulder shrug  were normal and symmetric. Motor: The motor testing reveals 5 over 5 strength of all 4 extremities. Good symmetric motor tone is noted throughout.  Sensory: Sensory testing is intact to soft touch on all 4 extremities. No evidence of extinction is noted.  Gait and station: Gait is arthritic, stable without assistive device   DIAGNOSTIC DATA (LABS, IMAGING, TESTING) - I reviewed patient records, labs, notes, testing and imaging myself where available.  Lab Results  Component Value Date   WBC 6.7 02/08/2024   HGB 13.2 02/08/2024   HCT 41.2 02/08/2024   MCV 90.4 02/08/2024   PLT 350 02/08/2024      Component Value Date/Time   NA 140 02/08/2024 1825   NA 141 12/07/2023 0916   K 3.2 (L) 02/08/2024 1825   CL 111 02/08/2024 1825   CO2 23 02/08/2024 1825   GLUCOSE 129 (H) 02/08/2024 1825   BUN 14 02/08/2024 1825   BUN 12  12/07/2023 0916   CREATININE 0.95 02/08/2024 1825   CREATININE 0.91 09/24/2022 0955   CALCIUM  9.2 02/08/2024 1825   PROT 7.3 12/07/2023 0916   ALBUMIN 4.6 12/07/2023 0916   AST 14 12/07/2023 0916   ALT 13 12/07/2023 0916   ALKPHOS 72 12/07/2023 0916   BILITOT 0.4 12/07/2023 0916   GFRNONAA >60 02/08/2024 1825   GFRNONAA 87 08/01/2019 1530   GFRAA >60 04/12/2020 1232   GFRAA 100 08/01/2019 1530   Lab Results  Component Value Date   CHOL 165 04/26/2016   HDL 41 04/26/2016   LDLCALC 88 04/26/2016   TRIG 181 (H) 04/26/2016   CHOLHDL 4.0 04/26/2016   Lab Results  Component Value Date   HGBA1C 5.8 (H) 04/26/2016   Lab Results  Component Value Date   VITAMINB12 345 07/31/2021   Lab Results  Component Value Date   TSH 0.880 07/11/2020      ASSESSMENT AND PLAN  58 y.o. year old female  has a past medical history of Anxiety, Arthritis, Asthma, Cervical hyperplasia, Chest pain,  Crohn disease (HCC), Diverticulitis of colon (07/17/2014), Diverticulosis, Dyspnea, GERD (gastroesophageal reflux disease), Hiatal hernia, History of iron deficiency anemia, Hypertension, Idiopathic intracranial hypertension, Migraines, Obesity, Palpitations, Pneumonia (2008), PONV (postoperative nausea and vomiting), Sleep apnea (sleep study 11/03/2009), and Vitamin B deficiency. here with   Chronic migraine without aura without status migrainosus, not intractable  IIH (idiopathic intracranial hypertension)  History of obstructive sleep apnea  Kimisha repots doing well. I will continue Ajovy  every 30 days. Failed Emgality . Amovig and Qulipta contraindicated due to chronic constipation. We will continue acetazolamide  500/1000mg , amitriptyline  25mg  QHS and rizatriptan  PRN. Consider Botox in future. We have discussed referral to sleep med for history of OSA. She declines at this time. Healthy lifestyle habits encouraged. Follow-up recommended in 6 months, sooner if needed. She verbalizes understanding and agreement with this plan.   I spent 30 minutes of face-to-face and non-face-to-face time with patient.  This included previsit chart review, lab review, study review, order entry, electronic health record documentation, patient education.   Greig Forbes, MSN, FNP-C 05/01/2024, 1:12 PM  [facility] [address] [place], [place] [address] [phone]

## 2024-04-28 DIAGNOSIS — D511 Vitamin B12 deficiency anemia due to selective vitamin B12 malabsorption with proteinuria: Secondary | ICD-10-CM | POA: Diagnosis not present

## 2024-05-01 ENCOUNTER — Encounter: Payer: Self-pay | Admitting: Family Medicine

## 2024-05-01 ENCOUNTER — Other Ambulatory Visit (HOSPITAL_COMMUNITY): Payer: Self-pay

## 2024-05-01 ENCOUNTER — Ambulatory Visit (INDEPENDENT_AMBULATORY_CARE_PROVIDER_SITE_OTHER): Admitting: Family Medicine

## 2024-05-01 VITALS — BP 135/90 | HR 97 | Ht 71.5 in | Wt 268.8 lb

## 2024-05-01 DIAGNOSIS — G932 Benign intracranial hypertension: Secondary | ICD-10-CM

## 2024-05-01 DIAGNOSIS — Z8669 Personal history of other diseases of the nervous system and sense organs: Secondary | ICD-10-CM | POA: Diagnosis not present

## 2024-05-01 DIAGNOSIS — G43709 Chronic migraine without aura, not intractable, without status migrainosus: Secondary | ICD-10-CM | POA: Diagnosis not present

## 2024-05-01 MED ORDER — RIZATRIPTAN BENZOATE 10 MG PO TBDP
10.0000 mg | ORAL_TABLET | ORAL | 11 refills | Status: AC | PRN
  Filled 2024-05-01: qty 9, 28d supply, fill #0

## 2024-05-01 MED ORDER — AMITRIPTYLINE HCL 25 MG PO TABS
25.0000 mg | ORAL_TABLET | Freq: Every day | ORAL | 3 refills | Status: AC
  Filled 2024-05-01: qty 90, 90d supply, fill #0

## 2024-05-01 MED ORDER — ACETAZOLAMIDE ER 500 MG PO CP12
ORAL_CAPSULE | ORAL | 3 refills | Status: AC
  Filled 2024-05-01 – 2024-07-26 (×2): qty 270, 90d supply, fill #0

## 2024-05-01 MED ORDER — AJOVY 225 MG/1.5ML ~~LOC~~ SOAJ
225.0000 mg | SUBCUTANEOUS | 3 refills | Status: AC
  Filled 2024-05-01: qty 4.5, 90d supply, fill #0
  Filled 2024-05-09: qty 1.5, 28d supply, fill #0
  Filled 2024-07-26: qty 1.5, 28d supply, fill #1
  Filled 2024-10-10: qty 1.5, 30d supply, fill #2
  Filled 2024-11-10: qty 1.5, 30d supply, fill #3

## 2024-05-01 NOTE — Patient Instructions (Signed)
 Below is our plan:  We will continue Ajovy , acetazolamide , amitriptyline  and rizatriptan  as prescribed.   Please make sure you are staying well hydrated. I recommend 50-60 ounces daily. Well balanced diet and regular exercise encouraged. Consistent sleep schedule with 6-8 hours recommended.   Please continue follow up with care team as directed.   Follow up with me in 1 year   You may receive a survey regarding today's visit. I encourage you to leave honest feed back as I do use this information to improve patient care. Thank you for seeing me today!   GENERAL HEADACHE INFORMATION:   Natural supplements: Magnesium  Oxide or Magnesium  Glycinate 500 mg at bed (up to 800 mg daily) Coenzyme Q10 300 mg in AM Vitamin B2- 200 mg twice a day   Add 1 supplement at a time since even natural supplements can have undesirable side effects. You can sometimes buy supplements cheaper (especially Coenzyme Q10) at www.WebmailGuide.co.za or at [facility].  Migraine with aura: There is increased risk for stroke in women with migraine with aura and a contraindication for the combined contraceptive pill for use by women who have migraine with aura. The risk for women with migraine without aura is lower. However other risk factors like smoking are far more likely to increase stroke risk than migraine. There is a recommendation for no smoking and for the use of OCPs without estrogen such as progestogen only pills particularly for women with migraine with aura.SABRA People who have migraine headaches with auras may be 3 times more likely to have a stroke caused by a blood clot, compared to migraine patients who don't see auras. Women who take hormone-replacement therapy may be 30 percent more likely to suffer a clot-based stroke than women not taking medication containing estrogen. Other risk factors like smoking and high blood pressure may be  much more important.    Vitamins and herbs that show potential:   Magnesium : Magnesium   (250 mg twice a day or 500 mg at bed) has a relaxant effect on smooth muscles such as blood vessels. Individuals suffering from frequent or daily headache usually have low magnesium  levels which can be increase with daily supplementation of 400-750 mg. Three trials found 40-90% average headache reduction  when used as a preventative. Magnesium  may help with headaches are aura, the best evidence for magnesium  is for migraine with aura is its thought to stop the cortical spreading depression we believe is the pathophysiology of migraine aura.Magnesium  also demonstrated the benefit in menstrually related migraine.  Magnesium  is part of the messenger system in the serotonin cascade and it is a good muscle relaxant.  It is also useful for constipation which can be a side effect of other medications used to treat migraine. Good sources include nuts, whole grains, and tomatoes. Side Effects: loose stool/diarrhea  Riboflavin (vitamin B 2) 200 mg twice a day. This vitamin assists nerve cells in the production of ATP a principal energy storing molecule.  It is necessary for many chemical reactions in the body.  There have been at least 3 clinical trials of riboflavin using 400 mg per day all of which suggested that migraine frequency can be decreased.  All 3 trials showed significant improvement in over half of migraine sufferers.  The supplement is found in bread, cereal, milk, meat, and poultry.  Most Americans get more riboflavin than the recommended daily allowance, however riboflavin deficiency is not necessary for the supplements to help prevent headache. Side effects: energizing, green urine  Coenzyme Q10: This is present in almost all cells in the body and is critical component for the conversion of energy.  Recent studies have shown that a nutritional supplement of CoQ10 can reduce the frequency of migraine attacks by improving the energy production of cells as with riboflavin.  Doses of 150 mg twice a day have  been shown to be effective.   Melatonin: Increasing evidence shows correlation between melatonin secretion and headache conditions.  Melatonin supplementation has decreased headache intensity and duration.  It is widely used as a sleep aid.  Sleep is natures way of dealing with migraine.  A dose of 3 mg is recommended to start for headaches including cluster headache. Higher doses up to 15 mg has been reviewed for use in Cluster headache and have been used. The rationale behind using melatonin for cluster is that many theories regarding the cause of Cluster headache center around the disruption of the normal circadian rhythm in the brain.  This helps restore the normal circadian rhythm.   HEADACHE DIET: Foods and beverages which may trigger migraine Note that only 20% of headache patients are food sensitive. You will know if you are food sensitive if you get a headache consistently 20 minutes to 2 hours after eating a certain food. Only cut out a food if it causes headaches, otherwise you might remove foods you enjoy! What matters most for diet is to eat a well balanced healthy diet full of vegetables and low fat protein, and to not miss meals.   Chocolate, other sweets ALL cheeses except cottage and cream cheese Dairy products, yogurt, sour cream, ice cream Liver Meat extracts (Bovril, Marmite, meat tenderizers) Meats or fish which have undergone aging, fermenting, pickling or smoking. These include: Hotdogs,salami,Lox,sausage, mortadellas,smoked salmon, pepperoni, Pickled herring Pods of broad bean (English beans, Chinese pea pods, [place] (fava) beans, lima and navy beans Ripe avocado, ripe banana Yeast extracts or active yeast preparations such as Brewer's or Fleishman's (commercial bakes goods are permitted) Tomato based foods, pizza (lasagna, etc.)   MSG (monosodium glutamate) is disguised as many things; look for these common aliases: Monopotassium glutamate Autolysed yeast Hydrolysed  protein Sodium caseinate "flavorings" "all natural preservatives Nutrasweet   Avoid all other foods that convincingly provoke headaches.   Resources: The Dizzy Bluford Aid Your Headache Diet, migrainestrong.com  https://zamora-andrews.com/   Caffeine and Migraine For patients that have migraine, caffeine intake more than 3 days per week can lead to dependency and increased migraine frequency. I would recommend cutting back on your caffeine intake as best you can. The recommended amount of caffeine is 200-300 mg daily, although migraine patients may experience dependency at even lower doses. While you may notice an increase in headache temporarily, cutting back will be helpful for headaches in the long run. For more information on caffeine and migraine, visit: https://americanmigrainefoundation.org/resource-library/caffeine-and-migraine/   Headache Prevention Strategies:   1. Maintain a headache diary; learn to identify and avoid triggers.  - This can be a simple note where you log when you had a headache, associated symptoms, and medications used - There are several smartphone apps developed to help track migraines: Migraine Buddy, Migraine Monitor, Curelator N1-Headache App   Common triggers include: Emotional triggers: Emotional/Upset family or friends Emotional/Upset occupation Business reversal/success Anticipation anxiety Crisis-serious Post-crisis periodNew job/position   Physical triggers: Vacation Day Weekend Strenuous Exercise High Altitude Location New Move Menstrual Day Physical Illness Oversleep/Not enough sleep Weather changes Light: Photophobia or light sesnitivity treatment involves a balance between desensitization and reduction  in overly strong input. Use dark polarized glasses outside, but not inside. Avoid bright or fluorescent light, but do not dim environment to the point that going into a normally lit room  hurts. Consider FL-41 tint lenses, which reduce the most irritating wavelengths without blocking too much light.  These can be obtained at axonoptics.com or theraspecs.com Foods: see list above.   2. Limit use of acute treatments (over-the-counter medications, triptans, etc.) to no more than 2 days per week or 10 days per month to prevent medication overuse headache (rebound headache).     3. Follow a regular schedule (including weekends and holidays): Don't skip meals. Eat a balanced diet. 8 hours of sleep nightly. Minimize stress. Exercise 30 minutes per day. Being overweight is associated with a 5 times increased risk of chronic migraine. Keep well hydrated and drink 6-8 glasses of water  per day.   4. Initiate non-pharmacologic measures at the earliest onset of your headache. Rest and quiet environment. Relax and reduce stress. Breathe2Relax is a free app that can instruct you on    some simple relaxtion and breathing techniques. Http://Dawnbuse.com is a    free website that provides teaching videos on relaxation.  Also, there are  many apps that   can be downloaded for "mindful" relaxation.  An app called YOGA NIDRA will help walk you through mindfulness. Another app called Calm can be downloaded to give you a structured mindfulness guide with daily reminders and skill development. Headspace for guided meditation Mindfulness Based Stress Reduction Online Course: www.palousemindfulness.com Cold compresses.   5. Don't wait!! Take the maximum allowable dosage of prescribed medication at the first sign of migraine.   6. Compliance:  Take prescribed medication regularly as directed and at the first sign of a migraine.   7. Communicate:  Call your physician when problems arise, especially if your headaches change, increase in frequency/severity, or become associated with neurological symptoms (weakness, numbness, slurred speech, etc.). Proceed to emergency room if you experience new or worsening  symptoms or symptoms do not resolve, if you have new neurologic symptoms or if headache is severe, or for any concerning symptom.   8. Headache/pain management therapies: Consider various complementary methods, including medication, behavioral therapy, psychological counselling, biofeedback, massage therapy, acupuncture, dry needling, and other modalities.  Such measures may reduce the need for medications. Counseling for pain management, where patients learn to function and ignore/minimize their pain, seems to work very well.   9. Recommend changing family's attention and focus away from patient's headaches. Instead, emphasize daily activities. If first question of day is 'How are your headaches/Do you have a headache today?', then patient will constantly think about headaches, thus making them worse. Goal is to re-direct attention away from headaches, toward daily activities and other distractions.   10. Helpful Websites: www.AmericanHeadacheSociety.org PatentHood.ch www.headaches.org TightMarket.nl www.achenet.org

## 2024-05-04 ENCOUNTER — Telehealth (HOSPITAL_COMMUNITY): Payer: Self-pay

## 2024-05-04 ENCOUNTER — Other Ambulatory Visit (HOSPITAL_COMMUNITY): Payer: Self-pay

## 2024-05-04 ENCOUNTER — Other Ambulatory Visit: Payer: Self-pay

## 2024-05-08 ENCOUNTER — Other Ambulatory Visit (HOSPITAL_COMMUNITY): Payer: Self-pay

## 2024-05-08 ENCOUNTER — Telehealth: Payer: Self-pay

## 2024-05-08 NOTE — Telephone Encounter (Signed)
 Pharmacy Patient Advocate Encounter   Received notification from Physician's Office that prior authorization for AJOVY  (fremanezumab -vfrm) injection 225MG /1.5ML auto-injectors is required/requested.   Insurance verification completed.   The patient is insured through [facility] .   Per test claim: PA required; PA submitted to above mentioned insurance via CoverMyMeds Key/confirmation #/EOC [id number] Status is pending

## 2024-05-08 NOTE — Telephone Encounter (Signed)
 Pharmacy Patient Advocate Encounter  Received notification from [employer] that Prior Authorization for Ajovy  has been APPROVED from 05/08/2024 to 05/07/2025. Ran test claim, Copay is $0. This test claim was processed through [facility]- copay amounts may vary at other pharmacies due to pharmacy/plan contracts, or as the patient moves through the different stages of their insurance plan.   PA #/Case ID/Reference #: [id number]

## 2024-05-09 ENCOUNTER — Other Ambulatory Visit: Payer: Self-pay

## 2024-05-09 ENCOUNTER — Other Ambulatory Visit (HOSPITAL_COMMUNITY): Payer: Self-pay

## 2024-05-09 NOTE — Telephone Encounter (Signed)
 Routing to [facility] call center team

## 2024-05-11 ENCOUNTER — Other Ambulatory Visit (HOSPITAL_COMMUNITY): Payer: Self-pay

## 2024-05-15 ENCOUNTER — Other Ambulatory Visit: Payer: Self-pay

## 2024-05-22 ENCOUNTER — Other Ambulatory Visit (HOSPITAL_COMMUNITY): Payer: Self-pay

## 2024-05-30 ENCOUNTER — Other Ambulatory Visit: Payer: Self-pay | Admitting: Gastroenterology

## 2024-05-30 ENCOUNTER — Telehealth (INDEPENDENT_AMBULATORY_CARE_PROVIDER_SITE_OTHER): Payer: Self-pay

## 2024-05-30 NOTE — Telephone Encounter (Signed)
 Patient would like to switch her Entyvio  infusions to [facility]. Her next dose is due on 06/15/2024. She would like to have this transfer done by then to have her next infusion there. Would you mind please placing the orders? Thanks

## 2024-05-30 NOTE — Telephone Encounter (Signed)
 Sure.  Please notify the site where she was obtaining her previous doses that she will be moving her infusions to another facility.  Keyana, FYI I placed the orders for the patient to continue her Entyvio  every 8 weeks at our infusion center.

## 2024-05-31 NOTE — Telephone Encounter (Signed)
 I spoke with the patient and made her aware we have placed the orders in Epic, and she should call her current infusion center to let them know she will be having her infusions transferred to [facility]. I did advise her to wait to do this after she receives confirmation from [facility] that everything is set for her and covered by insurance at this new facility. Patient states understanding.

## 2024-06-01 NOTE — Telephone Encounter (Signed)
 Thanks Keyana, appreciate it

## 2024-06-01 NOTE — Telephone Encounter (Signed)
 Thanks Keyana

## 2024-06-01 NOTE — Telephone Encounter (Signed)
 Good morning,   I was off yesterday but I did submit q8weeks auth for our infusion center this morning.

## 2024-06-06 ENCOUNTER — Telehealth: Payer: Self-pay

## 2024-06-06 NOTE — Telephone Encounter (Signed)
 Hello,  Patient will be scheduled as soon as possible.   Auth Submission: APPROVED Site of care: Site of care: [facility] Payer: [place] [employer] PPO  Medication & CPT/J Code(s) submitted: Entyvio  (Vedolizumab ) J3380 Diagnosis Code:  Route of submission (phone, fax, portal): portal Phone # Fax # Auth type: Buy/Bill PB Units/visits requested: 300mg  q8weeks Reference number: [id number] Approval from: 06/01/24 to 05/31/25

## 2024-06-06 NOTE — Telephone Encounter (Signed)
 Thanks

## 2024-06-06 NOTE — Telephone Encounter (Signed)
 I received a letter from [facility] stating the Entyvio  was approved. I have faxed this to Keyana at [facility] on 06/06/2024.

## 2024-06-14 DIAGNOSIS — M170 Bilateral primary osteoarthritis of knee: Secondary | ICD-10-CM | POA: Diagnosis not present

## 2024-06-14 DIAGNOSIS — M2559 Pain in other specified joint: Secondary | ICD-10-CM | POA: Diagnosis not present

## 2024-06-14 DIAGNOSIS — E669 Obesity, unspecified: Secondary | ICD-10-CM | POA: Diagnosis not present

## 2024-06-14 DIAGNOSIS — R768 Other specified abnormal immunological findings in serum: Secondary | ICD-10-CM | POA: Diagnosis not present

## 2024-06-14 DIAGNOSIS — Z6837 Body mass index (BMI) 37.0-37.9, adult: Secondary | ICD-10-CM | POA: Diagnosis not present

## 2024-06-14 DIAGNOSIS — K5080 Crohn's disease of both small and large intestine without complications: Secondary | ICD-10-CM | POA: Diagnosis not present

## 2024-06-15 ENCOUNTER — Encounter: Attending: Gastroenterology | Admitting: Internal Medicine

## 2024-06-15 VITALS — BP 147/99 | HR 83 | Temp 97.8°F | Resp 16

## 2024-06-15 DIAGNOSIS — K50019 Crohn's disease of small intestine with unspecified complications: Secondary | ICD-10-CM

## 2024-06-15 MED ORDER — VEDOLIZUMAB 300 MG IV SOLR
300.0000 mg | Freq: Once | INTRAVENOUS | Status: AC
  Administered 2024-06-15: 300 mg via INTRAVENOUS
  Filled 2024-06-15: qty 5

## 2024-06-15 NOTE — Progress Notes (Signed)
 Diagnosis: Crohn's Disease  Provider:  Eartha Sieving MD  Procedure: IV Infusion  IV Type: Peripheral, IV Location: R Antecubital  Entyvio  (Vedolizumab ), Dose: 300 mg  Infusion Start Time: 1504  Infusion Stop Time: 1552  Post Infusion IV Care: Observation period completed  Discharge: Condition: Good, Destination: Home . AVS Provided  Performed by:  Blanca Selinda SAUNDERS, LPN

## 2024-06-21 ENCOUNTER — Telehealth (INDEPENDENT_AMBULATORY_CARE_PROVIDER_SITE_OTHER): Payer: Self-pay

## 2024-06-21 NOTE — Telephone Encounter (Signed)
 Quinton from [facility] [phone] ext [phone] called stating they were not able to reach patient regarding her Entyvio  infusions with them. Patient has switched her care/Entyvio  infusions with [facility] to [facility] and had her last infusion on 06/15/2024 at [facility]. I called the patient and made her aware that she needed to call [facility] to let them know she had switched to a different facility to have her treatment. She says she waited to see how things went with Zelda Salmon before she told [facility] she was transferring her care. She says she would like to discuss some things here in clinic soon with a provider. I switched her to [facility] for a first available appointment. She says she will discuss some issues at this visit before she calls [facility] back to let them know she has switch and is going to continue with Zelda Salmon after the office visit here on 07/06/2024.

## 2024-06-21 NOTE — Telephone Encounter (Signed)
 Thanks for the update

## 2024-06-27 ENCOUNTER — Emergency Department (HOSPITAL_COMMUNITY)
Admission: EM | Admit: 2024-06-27 | Discharge: 2024-06-28 | Disposition: A | Discharge status: Home / Self Care | Attending: Emergency Medicine | Admitting: Emergency Medicine

## 2024-06-27 ENCOUNTER — Encounter: Payer: Self-pay | Admitting: Emergency Medicine

## 2024-06-27 ENCOUNTER — Emergency Department (HOSPITAL_COMMUNITY)

## 2024-06-27 ENCOUNTER — Ambulatory Visit: Admission: EM | Admit: 2024-06-27 | Discharge: 2024-06-27 | Disposition: A | Discharge status: Home / Self Care

## 2024-06-27 ENCOUNTER — Other Ambulatory Visit: Payer: Self-pay

## 2024-06-27 ENCOUNTER — Encounter (HOSPITAL_COMMUNITY): Payer: Self-pay

## 2024-06-27 DIAGNOSIS — Z8719 Personal history of other diseases of the digestive system: Secondary | ICD-10-CM

## 2024-06-27 DIAGNOSIS — J45909 Unspecified asthma, uncomplicated: Secondary | ICD-10-CM | POA: Diagnosis not present

## 2024-06-27 DIAGNOSIS — N132 Hydronephrosis with renal and ureteral calculous obstruction: Secondary | ICD-10-CM | POA: Insufficient documentation

## 2024-06-27 DIAGNOSIS — R109 Unspecified abdominal pain: Secondary | ICD-10-CM | POA: Diagnosis not present

## 2024-06-27 DIAGNOSIS — Z79899 Other long term (current) drug therapy: Secondary | ICD-10-CM | POA: Diagnosis not present

## 2024-06-27 DIAGNOSIS — I10 Essential (primary) hypertension: Secondary | ICD-10-CM | POA: Insufficient documentation

## 2024-06-27 DIAGNOSIS — R1032 Left lower quadrant pain: Secondary | ICD-10-CM | POA: Diagnosis not present

## 2024-06-27 DIAGNOSIS — N200 Calculus of kidney: Secondary | ICD-10-CM | POA: Diagnosis not present

## 2024-06-27 DIAGNOSIS — K5730 Diverticulosis of large intestine without perforation or abscess without bleeding: Secondary | ICD-10-CM | POA: Diagnosis not present

## 2024-06-27 DIAGNOSIS — K449 Diaphragmatic hernia without obstruction or gangrene: Secondary | ICD-10-CM | POA: Diagnosis not present

## 2024-06-27 LAB — COMPREHENSIVE METABOLIC PANEL WITH GFR
ALT: 15 U/L (ref 0–44)
AST: 16 U/L (ref 15–41)
Albumin: 4.1 g/dL (ref 3.5–5.0)
Alkaline Phosphatase: 60 U/L (ref 38–126)
Anion gap: 10 (ref 5–15)
BUN: 15 mg/dL (ref 6–20)
CO2: 20 mmol/L — ABNORMAL LOW (ref 22–32)
Calcium: 9.4 mg/dL (ref 8.9–10.3)
Chloride: 109 mmol/L (ref 98–111)
Creatinine, Ser: 1.23 mg/dL — ABNORMAL HIGH (ref 0.44–1.00)
GFR, Estimated: 52 mL/min — ABNORMAL LOW (ref >60)
Glucose, Bld: 110 mg/dL — ABNORMAL HIGH (ref 70–99)
Potassium: 3.6 mmol/L (ref 3.5–5.1)
Sodium: 139 mmol/L (ref 135–145)
Total Bilirubin: 0.7 mg/dL (ref 0.0–1.2)
Total Protein: 7.3 g/dL (ref 6.5–8.1)

## 2024-06-27 LAB — URINALYSIS, ROUTINE W REFLEX MICROSCOPIC
Bilirubin Urine: NEGATIVE
Glucose, UA: NEGATIVE mg/dL
Ketones, ur: NEGATIVE mg/dL
Leukocytes,Ua: NEGATIVE
Nitrite: NEGATIVE
Protein, ur: 100 mg/dL — AB
RBC / HPF: >50 RBC/hpf (ref 0–5)
Specific Gravity, Urine: 1.02 (ref 1.005–1.030)
pH: 5 (ref 5.0–8.0)

## 2024-06-27 LAB — CBC
HCT: 41.9 % (ref 36.0–46.0)
Hemoglobin: 13.6 g/dL (ref 12.0–15.0)
MCH: 28.9 pg (ref 26.0–34.0)
MCHC: 32.5 g/dL (ref 30.0–36.0)
MCV: 89.1 fL (ref 80.0–100.0)
Platelets: 357 K/uL (ref 150–400)
RBC: 4.7 MIL/uL (ref 3.87–5.11)
RDW: 14.5 % (ref 11.5–15.5)
WBC: 7.7 K/uL (ref 4.0–10.5)
nRBC: 0 % (ref 0.0–0.2)

## 2024-06-27 LAB — LIPASE, BLOOD: Lipase: 31 U/L (ref 11–51)

## 2024-06-27 MED ORDER — KETOROLAC TROMETHAMINE 30 MG/ML IJ SOLN
30.0000 mg | Freq: Once | INTRAMUSCULAR | Status: AC
  Administered 2024-06-27: 30 mg via INTRAMUSCULAR
  Filled 2024-06-27: qty 1

## 2024-06-27 MED ORDER — SODIUM CHLORIDE 0.9 % IV BOLUS
1000.0000 mL | Freq: Once | INTRAVENOUS | Status: AC
  Administered 2024-06-27: 1000 mL via INTRAVENOUS

## 2024-06-27 MED ORDER — ONDANSETRON HCL 4 MG/2ML IJ SOLN
4.0000 mg | Freq: Once | INTRAMUSCULAR | Status: AC
Start: 2024-06-27 — End: 2024-06-27
  Administered 2024-06-27: 4 mg via INTRAVENOUS
  Filled 2024-06-27: qty 2

## 2024-06-27 NOTE — ED Triage Notes (Signed)
 Left flank/abd pain that started today.  area sore to the touch.  States pain is severe at times.  Denies nausea and vomiting.

## 2024-06-27 NOTE — ED Provider Notes (Signed)
 [facility]    CSN: [id number] Arrival date & time: 06/27/24  1933      History   Chief Complaint No chief complaint on file.   HPI [name] is a 58 y.o. female.   The history is provided by the patient.   Patient presents for complaints of left-sided abdominal pain and left flank pain that started earlier today.  Patient rates pain 8/10 at present.  Patient reports prior history of Crohn's disease and diverticulitis.  Patient states that her abdomen is severe at times and is sore to the touch.  She denies fever, chills, nausea, vomiting, gas, bloating, urinary frequency, urgency, hesitancy, or decreased urine stream.  Patient states that she recently received an injection of Entyvio  for her Crohn's disease. Past Medical History:  Diagnosis Date   Anxiety    Arthritis    Asthma    Cervical hyperplasia    hisotry of   Chest pain    cath 2005 norm cors, repeat cath Feb 2011 normal cors and only minimally  elevated pulmonary pressures   Crohn disease (HCC)    Diverticulitis of colon 07/17/2014   Diverticulosis    Dyspnea    GERD (gastroesophageal reflux disease)    Hiatal hernia    History of iron deficiency anemia    Hypertension    Idiopathic intracranial hypertension    Per patient   Migraines    Obesity    Palpitations    Pneumonia 2008   14 days hospitalization, bilateral   PONV (postoperative nausea and vomiting)    Sleep apnea sleep study 11/03/2009    cpap; does sometimes, doesnt use every night. weight loss no longer using CPAP   Vitamin B deficiency     Patient Active Problem List   Diagnosis Date Noted   IBS (irritable bowel syndrome) 04/17/2024   Hematuria 12/27/2022   Right flank pain 12/27/2022   Change in stool caliber 04/29/2022   RUQ pain 04/29/2022   Right lower quadrant abdominal pain 04/27/2022   Change in bowel movement 01/26/2022   GERD (gastroesophageal reflux disease)    Crohn's ileitis (HCC) 07/31/2021   S/P  total knee arthroplasty, right 01/23/2021   IIH (idiopathic intracranial hypertension) 10/14/2020   Discoloration of skin of finger 09/12/2020   Bilateral hand pain 09/12/2020   Paresthesia of skin 09/12/2020   Chronic migraine without aura without status migrainosus, not intractable 06/22/2020   Iron deficiency anemia 06/20/2019   S/P left TKA 06/06/2019   Status post total left knee replacement 06/06/2019   Special screening for malignant neoplasms, colon 03/28/2019   LLQ abdominal pain 09/26/2018   Derangement of anterior horn of lateral meniscus of left knee    Derangement of posterior horn of medial meniscus of left knee    Hypokalemia 04/25/2016   Hyperglycemia 04/25/2016   Diverticulitis of colon 07/17/2014   Nausea without vomiting 07/17/2014   History of arthroscopy of right knee 07/25/2012   Difficulty walking 06/29/2012   Weakness of right leg 06/29/2012   Posterior tibial tendonitis 11/11/2011   PTTD (posterior tibial tendon dysfunction) 11/11/2011   Knee pain, right 11/11/2011   Chest pain 07/16/2011   Primary osteoarthritis of left knee 02/25/2010   PAIN IN JOINT, MULTIPLE SITES 02/25/2010   Essential hypertension 08/13/2009   Diaphragmatic hernia 08/13/2009   PALPITATIONS 08/13/2009   DYSPNEA 08/13/2009    Past Surgical History:  Procedure Laterality Date   ABDOMINAL HYSTERECTOMY     ANKLE ARTHROSCOPY  06/01/2012  Procedure: ANKLE ARTHROSCOPY;  Surgeon: Deward DELENA Schwartz, MD;  Location: [facility];  Service: Orthopedics;  Laterality: Left;  left ankle arthorsocpy with extensive debridement and gastroc slide   ANKLE SURGERY Left 05/11/2018   BIOPSY  04/26/2019   Procedure: BIOPSY;  Surgeon: Golda Claudis PENNER, MD;  Location: [facility];  Service: Endoscopy;;  ileum colon   BIOPSY  03/25/2022   Procedure: BIOPSY;  Surgeon: Eartha Angelia Sieving, MD;  Location: [facility];  Service: Gastroenterology;;   CARDIAC CATHETERIZATION  11/22/2009 and  2005   WNL   CESAREAN SECTION     CHOLECYSTECTOMY  09/08/2006   lap. chole.   CHONDROPLASTY  06/17/2012   Procedure: CHONDROPLASTY;  Surgeon: Taft FORBES Minerva, MD;  Location: [facility];  Service: Orthopedics;  Laterality: Right;  right patella   COLONOSCOPY N/A 04/26/2019   Rehman: a few erosions in ternimal ileum, two erosions in cecum (assocaited with acute inflammation) diverticulosis in sigmoid hepatic flexure and ascending colon, external hemorrhoids   COLONOSCOPY WITH PROPOFOL  N/A 03/25/2022   Procedure: COLONOSCOPY WITH PROPOFOL ;  Surgeon: Eartha Angelia Sieving, MD;  Location: [facility];  Service: Gastroenterology;  Laterality: N/A;  830 ASA 1   ESOPHAGOGASTRODUODENOSCOPY N/A 05/14/2015   Procedure: ESOPHAGOGASTRODUODENOSCOPY (EGD);  Surgeon: Claudis PENNER Golda, MD;  Location: [facility];  Service: Endoscopy;  Laterality: N/A;  730   ESOPHAGOGASTRODUODENOSCOPY (EGD) WITH PROPOFOL  N/A 03/25/2022   Procedure: ESOPHAGOGASTRODUODENOSCOPY (EGD) WITH PROPOFOL ;  Surgeon: Eartha Angelia Sieving, MD;  Location: [facility];  Service: Gastroenterology;  Laterality: N/A;   GIVENS CAPSULE STUDY N/A 05/24/2019   Procedure: GIVENS CAPSULE STUDY;  Surgeon: Golda Claudis PENNER, MD;  Location: [facility];  Service: Endoscopy;  Laterality: N/A;  7:30   INGUINAL HERNIA REPAIR  10/30/2008   right   KNEE ARTHROSCOPY Right 2013   KNEE ARTHROSCOPY WITH LATERAL MENISECTOMY Left 12/23/2016   Procedure: LEFT KNEE ARTHROSCOPY WITH LATERAL MENISECTOMY;  Surgeon: Taft FORBES Minerva, MD;  Location: [facility];  Service: Orthopedics;  Laterality: Left;   KNEE ARTHROSCOPY WITH MEDIAL MENISECTOMY Left 12/23/2016   Procedure: LEFT KNEE ARTHROSCOPY WITH MEDIAL MENISECTOMY CHONDROPLASTY PATELLA  AND MEDIAL FEMORAL CONDYLE LEFT KNEE;  Surgeon: Taft FORBES Minerva, MD;  Location: [facility];  Service: Orthopedics;  Laterality: Left;   POLYPECTOMY  03/25/2022   Procedure: POLYPECTOMY;  Surgeon: Eartha Angelia Sieving,  MD;  Location: [facility];  Service: Gastroenterology;;   SHOULDER ARTHROSCOPY WITH SUBACROMIAL DECOMPRESSION AND BICEP TENDON REPAIR Left 12/21/2017   Procedure: Left shoulder arthroscopic biceps tenodesis, SAD, DCR and labrum debridement;  Surgeon: Sharl Selinda Dover, MD;  Location: [facility];  Service: Orthopedics;  Laterality: Left;  120 mins   SHOULDER SURGERY     right x 2    TOTAL KNEE ARTHROPLASTY Left 06/06/2019   Procedure: TOTAL KNEE ARTHROPLASTY;  Surgeon: Ernie Cough, MD;  Location: [facility];  Service: Orthopedics;  Laterality: Left;  70 mins   TOTAL KNEE ARTHROPLASTY Right 01/23/2021   Procedure: TOTAL KNEE ARTHROPLASTY;  Surgeon: Ernie Cough, MD;  Location: [facility];  Service: Orthopedics;  Laterality: Right;  70 mins    OB History   No obstetric history on file.      Home Medications    Prior to Admission medications   Medication Sig Start Date End Date Taking? Authorizing Provider  acetaminophen  (TYLENOL ) 500 MG tablet Take 2 tablets (1,000 mg total) by mouth every 8 (eight) hours. Patient taking differently: Take 1,000 mg by mouth every  8 (eight) hours. As needed. 06/07/19   Danella Cough, PA-C  acetaZOLAMIDE  ER (DIAMOX ) 500 MG capsule Take 1 capsule (500 mg total) by mouth every morning AND 2 capsules (1,000 mg total) every evening. 05/01/24   Lomax, Amy, NP  albuterol  (PROVENTIL ) (2.5 MG/3ML) 0.083% nebulizer solution Inhale the contents of 1 vial via nebulizer every 6 hours as needed 05/05/22     albuterol  (VENTOLIN  HFA) 108 (90 Base) MCG/ACT inhaler Inhale 1-2 puffs by mouth every 4 hours as needed 06/08/23     amitriptyline  (ELAVIL ) 25 MG tablet Take 1 tablet (25 mg total) by mouth at bedtime. 05/01/24   Lomax, Amy, NP  amLODipine  (NORVASC ) 5 MG tablet Take 1 tablet (5 mg total) by mouth daily. 06/08/23 07/04/24    Cyanocobalamin  (VITAMIN B-12 IJ) Inject 1 Dose as directed every 30 (thirty) days.    [provider]  cyclobenzaprine  (FLEXERIL ) 5 MG tablet Take 1  tablet (5 mg total) by mouth every 8 (eight) hours as needed for muscle spasms. 06/07/23     diclofenac  Sodium (VOLTAREN ) 1 % GEL Apply 1 Application topically 4 (four) times daily as needed (pain).    [provider]  diphenhydrAMINE  (BENADRYL ) 25 MG tablet Take 25-50 mg by mouth every 6 (six) hours as needed for allergies.    [provider]  EPINEPHrine  (EPIPEN  2-PAK) 0.3 mg/0.3 mL IJ SOAJ injection Inject as directed for anaphylaxis. Patient taking differently: as needed. 08/24/23     famotidine  (PEPCID ) 40 MG tablet Take 1 tablet by mouth at bedtime. Needs office visit. 04/05/24   Carlan, Chelsea L, NP  fluticasone  (FLONASE ) 50 MCG/ACT nasal spray Place 2 sprays into both nostrils daily. Patient taking differently: Place 2 sprays into both nostrils daily. As needed. 06/28/23   Soldatova, Liuba, MD  Fremanezumab -vfrm (AJOVY ) 225 MG/1.5ML SOAJ Inject 225 mg into the skin every 30 (thirty) days. 05/01/24   Lomax, Amy, NP  hyoscyamine  (LEVSIN  SL) 0.125 MG SL tablet Dissolve 1 tablet under the tongue 3 times daily before meals. Patient taking differently: Place 0.125 mg under the tongue 3 (three) times daily before meals. As needed. 07/22/22   Carlan, Chelsea L, NP  loratadine  (CLARITIN ) 10 MG tablet Take 10 mg by mouth at bedtime.    [provider]  losartan  (COZAAR ) 100 MG tablet Take 1 tablet (100 mg total) by mouth daily. 08/23/23     meclizine  (ANTIVERT ) 25 MG tablet Take 25 mg by mouth 3 (three) times daily as needed for dizziness.    [provider]  montelukast  (SINGULAIR ) 10 MG tablet Take 1 tablet (10 mg total) by mouth daily. 06/09/23     omeprazole  (PRILOSEC) 40 MG capsule Take 1 capsule (40 mg total) by mouth daily. 04/05/24   Carlan, Chelsea L, NP  ondansetron  (ZOFRAN -ODT) 4 MG disintegrating tablet Dissolve 1 tablet by mouth every 8 hours as needed for nausea or vomiting. 04/05/24   Carlan, Chelsea L, NP  polyethylene glycol (MIRALAX  / GLYCOLAX ) 17 g packet  Take 17 g by mouth daily. Patient taking differently: Take 17 g by mouth as needed.    [provider]  rizatriptan  (MAXALT -MLT) 10 MG disintegrating tablet Dissolve 1 tablet (10 mg total) by mouth as needed for migraine. May repeat in 2 hours if needed 05/01/24   Lomax, Amy, NP  vedolizumab  (ENTYVIO ) 300 MG injection Inject 300 mg into the vein every 8 (eight) weeks. 11/11/21   Golda Claudis PENNER, MD    Family History Family History  Problem  Relation Age of Onset   Hypertension Mother    Atrial fibrillation Mother    Arthritis Mother    Lupus Father    COPD Father    Rheum arthritis Father    Sarcoidosis Father    Lupus Sister    Congestive Heart Failure Maternal Grandmother    Migraines Neg Hx    Headache Neg Hx     Social History Social History   Tobacco Use   Smoking status: Never    Passive exposure: Never   Smokeless tobacco: Never  Vaping Use   Vaping status: Never Used  Substance Use Topics   Alcohol use: No    Alcohol/week: 0.0 standard drinks of alcohol   Drug use: Never     Allergies   Bee pollen, Hydromorphone  hcl, Iodinated contrast media, Other, Wasp venom, Pollen extract, Adhesive [tape], and Omnipaque  [iohexol ]   Review of Systems Review of Systems Per HPI  Physical Exam Triage Vital Signs ED Triage Vitals  Encounter Vitals Group     BP 06/27/24 1947 (!) 145/99     Girls Systolic BP Percentile --      Girls Diastolic BP Percentile --      Boys Systolic BP Percentile --      Boys Diastolic BP Percentile --      Pulse Rate 06/27/24 1947 100     Resp 06/27/24 1947 20     Temp 06/27/24 1954 99.3 F (37.4 C)     Temp Source 06/27/24 1947 Oral     SpO2 06/27/24 1947 95 %     Weight --      Height --      Head Circumference --      Peak Flow --      Pain Score 06/27/24 1949 8     Pain Loc --      Pain Education --      Exclude from Growth Chart --    No data found.  Updated Vital Signs BP (!) 145/99 (BP Location: Right Arm)    Pulse 100   Temp 99.3 F (37.4 C) (Oral)   Resp 20   SpO2 95%   Visual Acuity Right Eye Distance:   Left Eye Distance:   Bilateral Distance:    Right Eye Near:   Left Eye Near:    Bilateral Near:     Physical Exam Vitals and nursing note reviewed.  Constitutional:      General: She is in acute distress (d/t pain).     Appearance: Normal appearance.  HENT:     Head: Normocephalic.  Eyes:     Extraocular Movements: Extraocular movements intact.     Conjunctiva/sclera: Conjunctivae normal.     Pupils: Pupils are equal, round, and reactive to light.  Cardiovascular:     Rate and Rhythm: Normal rate and regular rhythm.     Pulses: Normal pulses.     Heart sounds: Normal heart sounds.  Pulmonary:     Effort: Pulmonary effort is normal. No respiratory distress.     Breath sounds: Normal breath sounds. No stridor. No wheezing, rhonchi or rales.  Abdominal:     General: Bowel sounds are normal.     Palpations: Abdomen is soft.     Tenderness: There is abdominal tenderness. There is no right CVA tenderness or left CVA tenderness.  Musculoskeletal:     Cervical back: Normal range of motion.  Skin:    General: Skin is warm and dry.  Neurological:  General: No focal deficit present.     Mental Status: She is alert and oriented to person, place, and time.  Psychiatric:        Mood and Affect: Mood normal.        Behavior: Behavior normal.      UC Treatments / Results  Labs (all labs ordered are listed, but only abnormal results are displayed) Labs Reviewed - No data to display  EKG   Radiology No results found.  Procedures Procedures (including critical care time)  Medications Ordered in UC Medications - No data to display  Initial Impression / Assessment and Plan / UC Course  I have reviewed the triage vital signs and the nursing notes.  Pertinent labs & imaging results that were available during my care of the patient were reviewed by me and considered in  my medical decision making (see chart for details).  Patient presents for complaints of abdominal pain and left flank pain.  On exam, patient has abdominal tenderness to the left upper quadrant and left lower quadrant.  She appears uncomfortable during triage.  Given her current history of Crohn's and diverticulitis, patient was advised that it is recommended that she follow-up in the emergency department for further evaluation.  She also has a low-grade temperature, stating it is higher than it was at home.  Patient was advised of this recommendation and is in agreement with this plan of care.  Patient was discharged to the emergency department for further evaluation.  Patient's vital signs are stable, she is able to travel via private vehicle.   Final Clinical Impressions(s) / UC Diagnoses   Final diagnoses:  None   Discharge Instructions   None    ED Prescriptions   None    PDMP not reviewed this encounter.   Gilmer Etta PARAS, NP 06/27/24 2010

## 2024-06-27 NOTE — ED Notes (Signed)
 Patient is being discharged from the Urgent Care and sent to the Emergency Department via private vehicle . Per NP, patient is in need of higher level of care due to severe left ABD/Flank pain. Patient is aware and verbalizes understanding of plan of care.  Vitals:   06/27/24 1947 06/27/24 1954  BP: (!) 145/99   Pulse: 100   Resp: 20   Temp:  99.3 F (37.4 C)  SpO2: 95%

## 2024-06-27 NOTE — ED Provider Notes (Signed)
 [place] EMERGENCY DEPARTMENT AT [facility] Provider Note   CSN: [id number] Arrival date & time: 06/27/24  2007     Patient presents with: Abdominal Pain   Pev GAYATHRI FUTRELL is a 58 y.o. female.  {Add pertinent medical, surgical, social history, OB history to HPI:32947} HPI     This is a 58 year old female who presents with left flank pain.  Patient reports onset of left flank pain earlier today.  She states she had had some pain previously but today was the worst.  It seems to come and go.  It does radiate to the left lower quadrant.  She has a history of both kidney stones and diverticulitis and was unsure.  She is not having any fevers.  Did not take anything for her pain at home.  No nausea or vomiting.  Prior to Admission medications   Medication Sig Start Date End Date Taking? Authorizing Provider  acetaminophen  (TYLENOL ) 500 MG tablet Take 2 tablets (1,000 mg total) by mouth every 8 (eight) hours. Patient taking differently: Take 1,000 mg by mouth every 8 (eight) hours. As needed. 06/07/19   Danella Cough, PA-C  acetaZOLAMIDE  ER (DIAMOX ) 500 MG capsule Take 1 capsule (500 mg total) by mouth every morning AND 2 capsules (1,000 mg total) every evening. 05/01/24   Lomax, Amy, NP  albuterol  (PROVENTIL ) (2.5 MG/3ML) 0.083% nebulizer solution Inhale the contents of 1 vial via nebulizer every 6 hours as needed 05/05/22     albuterol  (VENTOLIN  HFA) 108 (90 Base) MCG/ACT inhaler Inhale 1-2 puffs by mouth every 4 hours as needed 06/08/23     amitriptyline  (ELAVIL ) 25 MG tablet Take 1 tablet (25 mg total) by mouth at bedtime. 05/01/24   Lomax, Amy, NP  amLODipine  (NORVASC ) 5 MG tablet Take 1 tablet (5 mg total) by mouth daily. 06/08/23 07/04/24    Cyanocobalamin  (VITAMIN B-12 IJ) Inject 1 Dose as directed every 30 (thirty) days.    [provider]  cyclobenzaprine  (FLEXERIL ) 5 MG tablet Take 1 tablet (5 mg total) by mouth every 8 (eight) hours as needed for muscle spasms. 06/07/23      diclofenac  Sodium (VOLTAREN ) 1 % GEL Apply 1 Application topically 4 (four) times daily as needed (pain).    [provider]  diphenhydrAMINE  (BENADRYL ) 25 MG tablet Take 25-50 mg by mouth every 6 (six) hours as needed for allergies.    [provider]  EPINEPHrine  (EPIPEN  2-PAK) 0.3 mg/0.3 mL IJ SOAJ injection Inject as directed for anaphylaxis. Patient taking differently: as needed. 08/24/23     famotidine  (PEPCID ) 40 MG tablet Take 1 tablet by mouth at bedtime. Needs office visit. 04/05/24   Carlan, Chelsea L, NP  fluticasone  (FLONASE ) 50 MCG/ACT nasal spray Place 2 sprays into both nostrils daily. Patient taking differently: Place 2 sprays into both nostrils daily. As needed. 06/28/23   Soldatova, Liuba, MD  Fremanezumab -vfrm (AJOVY ) 225 MG/1.5ML SOAJ Inject 225 mg into the skin every 30 (thirty) days. 05/01/24   Lomax, Amy, NP  hyoscyamine  (LEVSIN  SL) 0.125 MG SL tablet Dissolve 1 tablet under the tongue 3 times daily before meals. Patient taking differently: Place 0.125 mg under the tongue 3 (three) times daily before meals. As needed. 07/22/22   Carlan, Chelsea L, NP  loratadine  (CLARITIN ) 10 MG tablet Take 10 mg by mouth at bedtime.    [provider]  losartan  (COZAAR ) 100 MG tablet Take 1 tablet (100 mg total) by mouth daily. 08/23/23     meclizine  (ANTIVERT ) 25  MG tablet Take 25 mg by mouth 3 (three) times daily as needed for dizziness.    [provider]  montelukast  (SINGULAIR ) 10 MG tablet Take 1 tablet (10 mg total) by mouth daily. 06/09/23     omeprazole  (PRILOSEC) 40 MG capsule Take 1 capsule (40 mg total) by mouth daily. 04/05/24   Carlan, Chelsea L, NP  ondansetron  (ZOFRAN -ODT) 4 MG disintegrating tablet Dissolve 1 tablet by mouth every 8 hours as needed for nausea or vomiting. 04/05/24   Carlan, Chelsea L, NP  polyethylene glycol (MIRALAX  / GLYCOLAX ) 17 g packet Take 17 g by mouth daily. Patient taking differently: Take 17 g by mouth as needed.     [provider]  rizatriptan  (MAXALT -MLT) 10 MG disintegrating tablet Dissolve 1 tablet (10 mg total) by mouth as needed for migraine. May repeat in 2 hours if needed 05/01/24   Lomax, Amy, NP  vedolizumab  (ENTYVIO ) 300 MG injection Inject 300 mg into the vein every 8 (eight) weeks. 11/11/21   Golda Claudis PENNER, MD    Allergies: Bee pollen, Hydromorphone  hcl, Iodinated contrast media, Other, Wasp venom, Pollen extract, Adhesive [tape], and Omnipaque  [iohexol ]    Review of Systems  Constitutional:  Negative for fever.  Respiratory:  Negative for shortness of breath.   Cardiovascular:  Negative for chest pain.  Genitourinary:  Positive for flank pain. Negative for difficulty urinating and dysuria.  All other systems reviewed and are negative.   Updated Vital Signs BP (!) 140/91 (BP Location: Right Arm)   Pulse (!) 101   Temp 99.5 F (37.5 C) (Oral)   Resp 18   Ht 1.803 m (5' 11)   Wt 121.9 kg   SpO2 97%   BMI 37.48 kg/m   Physical Exam Vitals and nursing note reviewed.  Constitutional:      Appearance: She is well-developed. She is obese. She is not ill-appearing.  HENT:     Head: Normocephalic and atraumatic.  Eyes:     Pupils: Pupils are equal, round, and reactive to light.  Cardiovascular:     Rate and Rhythm: Normal rate and regular rhythm.     Heart sounds: Normal heart sounds.  Pulmonary:     Effort: Pulmonary effort is normal. No respiratory distress.     Breath sounds: No wheezing.  Abdominal:     General: Bowel sounds are normal.     Palpations: Abdomen is soft.     Tenderness: There is left CVA tenderness. There is no guarding or rebound.  Musculoskeletal:     Cervical back: Neck supple.  Skin:    General: Skin is warm and dry.  Neurological:     Mental Status: She is alert and oriented to person, place, and time.     (all labs ordered are listed, but only abnormal results are displayed) Labs Reviewed  COMPREHENSIVE METABOLIC PANEL WITH GFR -  Abnormal; Notable for the following components:      Result Value   CO2 20 (*)    Glucose, Bld 110 (*)    Creatinine, Ser 1.23 (*)    GFR, Estimated 52 (*)    All other components within normal limits  URINALYSIS, ROUTINE W REFLEX MICROSCOPIC - Abnormal; Notable for the following components:   Color, Urine AMBER (*)    APPearance CLOUDY (*)    Hgb urine dipstick LARGE (*)    Protein, ur 100 (*)    Bacteria, UA RARE (*)    All other components within normal limits  LIPASE, BLOOD  CBC    EKG: None  Radiology: CT ABDOMEN PELVIS WO CONTRAST Result Date: 06/27/2024 EXAM: CT ABDOMEN AND PELVIS WITHOUT CONTRAST 06/27/2024 10:31:11 PM TECHNIQUE: CT of the abdomen and pelvis was performed without the administration of intravenous contrast. Multiplanar reformatted images are provided for review. Automated exposure control, iterative reconstruction, and/or weight-based adjustment of the mA/kV was utilized to reduce the radiation dose to as low as reasonably achievable. COMPARISON: 08/24/2023 CLINICAL HISTORY: LLQ abdominal pain. c/o LLQ pain, pt got Chron's IV treatment on the 11th, unsure if that could have triggered flare up of diverticulitis, pt says she has hx of this and feels similar. FINDINGS: LOWER CHEST: Tiny hiatal hernia. LIVER: The liver is unremarkable. GALLBLADDER AND BILE DUCTS: Status post cholecystectomy. No biliary ductal dilatation. SPLEEN: No acute abnormality. PANCREAS: No acute abnormality. ADRENAL GLANDS: No acute abnormality. KIDNEYS, URETERS AND BLADDER: Multiple nonobstructing left renal calculi measuring up to 5 mm in the upper kidney (image 30). Mild left hydronephrosis with associated 3 mm proximal left ureteral calculus at L3-4 (image 50). No perinephric or periureteral stranding. Urinary bladder is unremarkable. GI AND BOWEL: Stomach demonstrates no acute abnormality. Normal appendix (image 69). Mild sigmoid diverticulosis, without evidence of diverticulitis. There is no  bowel obstruction. PERITONEUM AND RETROPERITONEUM: No ascites. No free air. VASCULATURE: Aorta is normal in caliber. LYMPH NODES: No lymphadenopathy. REPRODUCTIVE ORGANS: Status post hysterectomy. BONES AND SOFT TISSUES: Mild fat-containing left femoral hernia (image 80). Mild lumbar levoscoliosis with degenerative changes of the lower thoracic spine. No acute osseous abnormality. No focal soft tissue abnormality. IMPRESSION: 1. Mild left hydronephrosis with associated 3 mm proximal left ureteral calculus at L3-4. 2. Multiple nonobstructing left renal calculi measuring up to 5 mm in the upper kidney. Electronically signed by: Pinkie Pebbles MD 06/27/2024 10:36 PM EDT RP Workstation: [id number]    {Document cardiac monitor, telemetry assessment procedure when appropriate:32947} Procedures   Medications Ordered in the ED  sodium chloride  0.9 % bolus 1,000 mL (has no administration in time range)  ondansetron  (ZOFRAN ) injection 4 mg (has no administration in time range)  ketorolac  (TORADOL ) 30 MG/ML injection 30 mg (has no administration in time range)      {Click here for ABCD2, HEART and other calculators REFRESH Note before signing:1}                              Medical Decision Making Amount and/or Complexity of Data Reviewed Labs: ordered. Radiology: ordered.  Risk Prescription drug management.   ***  {Document critical care time when appropriate  Document review of labs and clinical decision tools ie CHADS2VASC2, etc  Document your independent review of radiology images and any outside records  Document your discussion with family members, caretakers and with consultants  Document social determinants of health affecting pt's care  Document your decision making why or why not admission, treatments were needed:32947:::1}   Final diagnoses:  None    ED Discharge Orders     None

## 2024-06-27 NOTE — ED Triage Notes (Signed)
 Pt to ED from urgent care with c/o LLQ pain, pt got Chron's IV treatment on the 11th, unsure if that could have triggered flare up of diverticulitis, pt says she has hx of this and feels similar.

## 2024-06-27 NOTE — Discharge Instructions (Addendum)
 Go to the emergency department for further evaluation.

## 2024-06-27 NOTE — ED Notes (Signed)
 Report given to [facility] at [facility] ED

## 2024-06-28 ENCOUNTER — Other Ambulatory Visit (HOSPITAL_COMMUNITY): Payer: Self-pay

## 2024-06-28 MED ORDER — IBUPROFEN 600 MG PO TABS
600.0000 mg | ORAL_TABLET | Freq: Four times a day (QID) | ORAL | 0 refills | Status: AC | PRN
  Filled 2024-06-28: qty 30, 8d supply, fill #0

## 2024-06-28 MED ORDER — OXYCODONE-ACETAMINOPHEN 5-325 MG PO TABS
1.0000 | ORAL_TABLET | Freq: Four times a day (QID) | ORAL | 0 refills | Status: DC | PRN
  Filled 2024-06-28: qty 10, 3d supply, fill #0

## 2024-06-28 MED ORDER — ONDANSETRON 4 MG PO TBDP
4.0000 mg | ORAL_TABLET | Freq: Three times a day (TID) | ORAL | 0 refills | Status: DC | PRN
  Filled 2024-06-28: qty 20, 7d supply, fill #0

## 2024-06-28 MED ORDER — TAMSULOSIN HCL 0.4 MG PO CAPS
0.4000 mg | ORAL_CAPSULE | Freq: Every day | ORAL | 0 refills | Status: DC
  Filled 2024-06-28: qty 30, 30d supply, fill #0

## 2024-06-28 NOTE — ED Notes (Signed)
 Pt tolerated PO challenge

## 2024-06-28 NOTE — ED Notes (Signed)
 Reviewed D/C information with the patient, pt verbalized understanding. No additional concerns at this time.

## 2024-06-28 NOTE — ED Notes (Signed)
 Pt provided ginger ale for PO challenge

## 2024-06-28 NOTE — Discharge Instructions (Addendum)
 You were seen today and found to have kidney stone and on the left side.  Make sure that you are staying hydrated.  This will likely pass on its own.  If you have progressive or worsening symptoms, or develop fevers you should be reevaluated.  Take medications as prescribed.  Do not drive while taking Percocet.

## 2024-07-06 ENCOUNTER — Encounter (INDEPENDENT_AMBULATORY_CARE_PROVIDER_SITE_OTHER): Payer: Self-pay | Admitting: Gastroenterology

## 2024-07-06 ENCOUNTER — Telehealth (INDEPENDENT_AMBULATORY_CARE_PROVIDER_SITE_OTHER): Payer: Self-pay | Admitting: Gastroenterology

## 2024-07-06 ENCOUNTER — Ambulatory Visit (INDEPENDENT_AMBULATORY_CARE_PROVIDER_SITE_OTHER): Admitting: Gastroenterology

## 2024-07-06 VITALS — BP 128/76 | HR 78 | Temp 98.3°F | Ht 71.0 in | Wt 269.7 lb

## 2024-07-06 DIAGNOSIS — Z860101 Personal history of adenomatous and serrated colon polyps: Secondary | ICD-10-CM

## 2024-07-06 DIAGNOSIS — K50019 Crohn's disease of small intestine with unspecified complications: Secondary | ICD-10-CM | POA: Diagnosis not present

## 2024-07-06 DIAGNOSIS — K589 Irritable bowel syndrome without diarrhea: Secondary | ICD-10-CM

## 2024-07-06 DIAGNOSIS — M2550 Pain in unspecified joint: Secondary | ICD-10-CM

## 2024-07-06 NOTE — Patient Instructions (Addendum)
 Will increase Entyvio  to every 6-week dosing Please ask PCP regarding full, pneumonia and shingles vaccination

## 2024-07-06 NOTE — Progress Notes (Unsigned)
 Toribio Fortune, M.D. Gastroenterology & Hepatology [facility] [address] [place], [place] [address]  Primary Care Physician: Marvine Rush, MD [address] [place] [address]  I will communicate my assessment and recommendations to the referring MD via EMR.  Problems: Small bowel Crohn's disease Overlapping IBS   History of Present Illness: [name] is a 58 y.o. female with a history of asthma, small bowel Crohn's disease , GERD, hypertension, obesity, diverticulosis and presumed diverticulitis, who comes for SB Crohn's disease.  The patient was last seen on 04/17/24. At that time, the patient was continued on Entyvio  every 8 weeks and Levsin  as needed.  Also advised to follow a low FODMAP diet and to take FDgard as needed.  The patient denies having any nausea, vomiting, fever, chills, hematochezia, melena, hematemesis, abdominal distention, abdominal pain, diarrhea, jaundice, pruritus or weight loss.  Patient went to the ER on 06/27/2024.  Had a CT of the abdomen pelvis without contrast which showed presence of left renal calculi and left hydronephrosis at L3-4 area.  Levels of vedolizumab  on 3/27/2025Were adequate with a value of 7.1, negative antibodies   Last time TB and hepatitis B surface antigen were checked: 12/07/2023   Last Colonoscopy:03/25/22- One 4 mm polyp in the cecum, removed with a cold snare.-tubular adenoma  - Melanosis in the colon. - Diverticulosis in the entire examined colon. - The distal rectum and anal verge are normal on retroflexion view.   Recommend a repeat colonoscopy in 5 years   Last Endoscopy:03/25/22- 3 cm hiatal hernia. - Non-obstructing Schatzki ring. - Normal stomach. - Normal examined Normal examined duodenum, biopsied-mild inflammation   Recommendations:  Repeat colon in 5 years   Past Medical History: Past Medical History:  Diagnosis Date   Anxiety    Arthritis    Asthma     Cervical hyperplasia    hisotry of   Chest pain    cath 2005 norm cors, repeat cath Feb 2011 normal cors and only minimally  elevated pulmonary pressures   Crohn disease (HCC)    Diverticulitis of colon 07/17/2014   Diverticulosis    Dyspnea    GERD (gastroesophageal reflux disease)    Hiatal hernia    History of iron deficiency anemia    Hypertension    Idiopathic intracranial hypertension    Per patient   Migraines    Obesity    Palpitations    Pneumonia 2008   14 days hospitalization, bilateral   PONV (postoperative nausea and vomiting)    Sleep apnea sleep study 11/03/2009    cpap; does sometimes, doesnt use every night. weight loss no longer using CPAP   Vitamin B deficiency     Past Surgical History: Past Surgical History:  Procedure Laterality Date   ABDOMINAL HYSTERECTOMY     ANKLE ARTHROSCOPY  06/01/2012   Procedure: ANKLE ARTHROSCOPY;  Surgeon: Deward DELENA Schwartz, MD;  Location: [facility];  Service: Orthopedics;  Laterality: Left;  left ankle arthorsocpy with extensive debridement and gastroc slide   ANKLE SURGERY Left 05/11/2018   BIOPSY  04/26/2019   Procedure: BIOPSY;  Surgeon: Golda Claudis PENNER, MD;  [facility];  Service: Endoscopy;;  ileum colon   BIOPSY  03/25/2022   Procedure: BIOPSY;  Surgeon: Fortune Angelia Toribio, MD;  [facility]: [facility];  Service: Gastroenterology;;   CARDIAC CATHETERIZATION  11/22/2009 and 2005   WNL   CESAREAN SECTION     CHOLECYSTECTOMY  09/08/2006   lap. chole.   CHONDROPLASTY  06/17/2012   Procedure: CHONDROPLASTY;  Surgeon: Taft FORBES Minerva, MD;  Location: [facility];  Service: Orthopedics;  Laterality: Right;  right patella   COLONOSCOPY N/A 04/26/2019   Rehman: a few erosions in ternimal ileum, two erosions in cecum (assocaited with acute inflammation) diverticulosis in sigmoid hepatic flexure and ascending colon, external hemorrhoids   COLONOSCOPY WITH PROPOFOL  N/A 03/25/2022   Procedure:  COLONOSCOPY WITH PROPOFOL ;  Surgeon: Eartha Angelia Sieving, MD;  [facility];  Service: Gastroenterology;  Laterality: N/A;  830 ASA 1   ESOPHAGOGASTRODUODENOSCOPY N/A 05/14/2015   Procedure: ESOPHAGOGASTRODUODENOSCOPY (EGD);  Surgeon: Claudis RAYMOND Rivet, MD;  [facility];  Service: Endoscopy;  Laterality: N/A;  730   ESOPHAGOGASTRODUODENOSCOPY (EGD) WITH PROPOFOL  N/A 03/25/2022   Procedure: ESOPHAGOGASTRODUODENOSCOPY (EGD) WITH PROPOFOL ;  Surgeon: Eartha Angelia Sieving, MD;  [facility];  Service: Gastroenterology;  Laterality: N/A;   GIVENS CAPSULE STUDY N/A 05/24/2019   Procedure: GIVENS CAPSULE STUDY;  Surgeon: Rivet Claudis RAYMOND, MD;  [facility];  Service: Endoscopy;  Laterality: N/A;  7:30   INGUINAL HERNIA REPAIR  10/30/2008   right   KNEE ARTHROSCOPY Right 2013   KNEE ARTHROSCOPY WITH LATERAL MENISECTOMY Left 12/23/2016   Procedure: LEFT KNEE ARTHROSCOPY WITH LATERAL MENISECTOMY;  Surgeon: Taft FORBES Minerva, MD;  Location: [facility];  Service: Orthopedics;  Laterality: Left;   KNEE ARTHROSCOPY WITH MEDIAL MENISECTOMY Left 12/23/2016   Procedure: LEFT KNEE ARTHROSCOPY WITH MEDIAL MENISECTOMY CHONDROPLASTY PATELLA  AND MEDIAL FEMORAL CONDYLE LEFT KNEE;  Surgeon: Taft FORBES Minerva, MD;  Location: [facility];  Service: Orthopedics;  Laterality: Left;   POLYPECTOMY  03/25/2022   Procedure: POLYPECTOMY;  Surgeon: Eartha Angelia Sieving, MD;  [facility];  Service: Gastroenterology;;   SHOULDER ARTHROSCOPY WITH SUBACROMIAL DECOMPRESSION AND BICEP TENDON REPAIR Left 12/21/2017   Procedure: Left shoulder arthroscopic biceps tenodesis, SAD, DCR and labrum debridement;  Surgeon: Sharl Selinda Dover, MD;  Location: [facility];  Service: Orthopedics;  Laterality: Left;  120 mins   SHOULDER SURGERY     right x 2    TOTAL KNEE ARTHROPLASTY Left 06/06/2019   Procedure: TOTAL KNEE ARTHROPLASTY;  Surgeon: Ernie Cough, MD;  Location: [facility];   Service: Orthopedics;  Laterality: Left;  70 mins   TOTAL KNEE ARTHROPLASTY Right 01/23/2021   Procedure: TOTAL KNEE ARTHROPLASTY;  Surgeon: Ernie Cough, MD;  Location: [facility];  Service: Orthopedics;  Laterality: Right;  70 mins    Family History: Family History  Problem Relation Age of Onset   Hypertension Mother    Atrial fibrillation Mother    Arthritis Mother    Lupus Father    COPD Father    Rheum arthritis Father    Sarcoidosis Father    Lupus Sister    Congestive Heart Failure Maternal Grandmother    Migraines Neg Hx    Headache Neg Hx     Social History: Social History   Tobacco Use  Smoking Status Never   Passive exposure: Never  Smokeless Tobacco Never   Social History   Substance and Sexual Activity  Alcohol Use No   Alcohol/week: 0.0 standard drinks of alcohol   Social History   Substance and Sexual Activity  Drug Use Never    Allergies: Allergies  Allergen Reactions   Bee Pollen Anaphylaxis, Other (See Comments) and Swelling    Other Reaction(s): Other (See Comments)  bee pollen   Hydromorphone  Hcl Other (See Comments)  and Nausea And Vomiting    Other Reaction(s): Not available  hydromorphone   Other Reaction(s): headache    hydromorphone    Iodinated Contrast Media Anaphylaxis, Hives, Itching and Nausea Only    Pt. Was premedicated with emergent protocol.   Other Shortness Of Breath    Lemon grass   Wasp Venom Anaphylaxis and Shortness Of Breath   Pollen Extract Swelling   Adhesive [Tape] Rash   Omnipaque  [Iohexol ] Hives and Nausea Only    Pt. Was premedicated with emergent protocol.    Medications: Current Outpatient Medications  Medication Sig Dispense Refill   acetaminophen  (TYLENOL ) 500 MG tablet Take 2 tablets (1,000 mg total) by mouth every 8 (eight) hours. (Patient taking differently: Take 1,000 mg by mouth as needed.) 30 tablet 0   acetaZOLAMIDE  ER (DIAMOX ) 500 MG capsule Take 1 capsule (500 mg total) by mouth every morning  AND 2 capsules (1,000 mg total) every evening. 270 capsule 3   albuterol  (PROVENTIL ) (2.5 MG/3ML) 0.083% nebulizer solution Inhale the contents of 1 vial via nebulizer every 6 hours as needed 90 mL 1   albuterol  (VENTOLIN  HFA) 108 (90 Base) MCG/ACT inhaler Inhale 1-2 puffs by mouth every 4 hours as needed 6.7 g 9   amitriptyline  (ELAVIL ) 25 MG tablet Take 1 tablet (25 mg total) by mouth at bedtime. 90 tablet 3   amLODipine  (NORVASC ) 5 MG tablet Take 1 tablet (5 mg total) by mouth daily. 90 tablet 2   Cyanocobalamin  (VITAMIN B-12 IJ) Inject 1 Dose as directed every 30 (thirty) days.     cyclobenzaprine  (FLEXERIL ) 5 MG tablet Take 1 tablet (5 mg total) by mouth every 8 (eight) hours as needed for muscle spasms. 40 tablet 2   diclofenac  Sodium (VOLTAREN ) 1 % GEL Apply 1 Application topically 4 (four) times daily as needed (pain).     diphenhydrAMINE  (BENADRYL ) 25 MG tablet Take 25-50 mg by mouth every 6 (six) hours as needed for allergies.     EPINEPHrine  (EPIPEN  2-PAK) 0.3 mg/0.3 mL IJ SOAJ injection Inject as directed for anaphylaxis. (Patient taking differently: as needed.) 2 each 5   famotidine  (PEPCID ) 40 MG tablet Take 1 tablet by mouth at bedtime. Needs office visit. 90 tablet 3   fluticasone  (FLONASE ) 50 MCG/ACT nasal spray Place 2 sprays into both nostrils daily. (Patient taking differently: Place 2 sprays into both nostrils daily. As needed.) 16 g 6   Fremanezumab -vfrm (AJOVY ) 225 MG/1.5ML SOAJ Inject 225 mg into the skin every 30 (thirty) days. 4.5 mL 3   hyoscyamine  (LEVSIN  SL) 0.125 MG SL tablet Dissolve 1 tablet under the tongue 3 times daily before meals. (Patient taking differently: Place 0.125 mg under the tongue 3 (three) times daily before meals. As needed.) 90 tablet 1   ibuprofen  (ADVIL ) 600 MG tablet Take 1 tablet (600 mg total) by mouth every 6 (six) hours as needed. 30 tablet 0   loratadine  (CLARITIN ) 10 MG tablet Take 10 mg by mouth at bedtime.     losartan  (COZAAR ) 100 MG  tablet Take 1 tablet (100 mg total) by mouth daily. 90 tablet 1   meclizine  (ANTIVERT ) 25 MG tablet Take 25 mg by mouth 3 (three) times daily as needed for dizziness.     montelukast  (SINGULAIR ) 10 MG tablet Take 1 tablet (10 mg total) by mouth daily. 90 tablet 3   omeprazole  (PRILOSEC) 40 MG capsule Take 1 capsule (40 mg total) by mouth daily. 90 capsule 3   ondansetron  (ZOFRAN -ODT) 4 MG disintegrating tablet Dissolve 1  tablet by mouth every 8 hours as needed for nausea or vomiting. 90 tablet 1   oxyCODONE -acetaminophen  (PERCOCET/ROXICET) 5-325 MG tablet Take 1 tablet by mouth every 6 (six) hours as needed for severe pain (pain score 7-10). 10 tablet 0   polyethylene glycol (MIRALAX  / GLYCOLAX ) 17 g packet Take 17 g by mouth daily. (Patient taking differently: Take 17 g by mouth as needed.)     rizatriptan  (MAXALT -MLT) 10 MG disintegrating tablet Dissolve 1 tablet (10 mg total) by mouth as needed for migraine. May repeat in 2 hours if needed 9 tablet 11   tamsulosin  (FLOMAX ) 0.4 MG CAPS capsule Take 1 capsule (0.4 mg total) by mouth daily. 30 capsule 0   vedolizumab  (ENTYVIO ) 300 MG injection Inject 300 mg into the vein every 8 (eight) weeks.     No current facility-administered medications for this visit.    Review of Systems: GENERAL: negative for malaise, night sweats HEENT: No changes in hearing or vision, no nose bleeds or other nasal problems. NECK: Negative for lumps, goiter, pain and significant neck swelling RESPIRATORY: Negative for cough, wheezing CARDIOVASCULAR: Negative for chest pain, leg swelling, palpitations, orthopnea GI: SEE HPI MUSCULOSKELETAL: Negative for joint pain or swelling, back pain, and muscle pain. SKIN: Negative for lesions, rash PSYCH: Negative for sleep disturbance, mood disorder and recent psychosocial stressors. HEMATOLOGY Negative for prolonged bleeding, bruising easily, and swollen nodes. ENDOCRINE: Negative for cold or heat intolerance, polyuria,  polydipsia and goiter. NEURO: negative for tremor, gait imbalance, syncope and seizures. The remainder of the review of systems is noncontributory.   Physical Exam: BP 128/76 (BP Location: Left Arm, Patient Position: Sitting, Cuff Size: Large)   Pulse 78   Temp 98.3 F (36.8 C) (Temporal)   Ht 5' 11 (1.803 m)   Wt 269 lb 11.2 oz (122.3 kg)   BMI 37.62 kg/m  GENERAL: The patient is AO x3, in no acute distress. HEENT: Head is normocephalic and atraumatic. EOMI are intact. Mouth is well hydrated and without lesions. NECK: Supple. No masses LUNGS: Clear to auscultation. No presence of rhonchi/wheezing/rales. Adequate chest expansion HEART: RRR, normal s1 and s2. ABDOMEN: Mildly tender upon palpation of the lower abdomen, no guarding, no peritoneal signs, and nondistended. BS +. No masses. EXTREMITIES: Without any cyanosis, clubbing, rash, lesions or edema. NEUROLOGIC: AOx3, no focal motor deficit. SKIN: no jaundice, no rashes  Imaging/Labs: as above  I personally reviewed and interpreted the available labs, imaging and endoscopic files.  Impression and Plan: [name] is a 58 y.o. female coming for follow up of ***  Will continue to monitor arthralgias.  All questions were answered.      Toribio Fortune, MD Gastroenterology and Hepatology [facility]

## 2024-07-06 NOTE — Telephone Encounter (Signed)
 Hi [name], I would like to increase the frequency of her maintenance dose of Entyvio  to every 6 weeks.  I have changed the infusion orders.  Please send a new PA to the insurance 4 days increase frequency.  Supporting documents will be the vedolizumab  levels on 12/30/2023 showing suboptimal levels of the medication. Thanks

## 2024-07-06 NOTE — Progress Notes (Unsigned)
 SABRA

## 2024-07-07 ENCOUNTER — Encounter (INDEPENDENT_AMBULATORY_CARE_PROVIDER_SITE_OTHER): Payer: Self-pay

## 2024-07-07 ENCOUNTER — Telehealth (INDEPENDENT_AMBULATORY_CARE_PROVIDER_SITE_OTHER): Payer: Self-pay | Admitting: *Deleted

## 2024-07-07 NOTE — Telephone Encounter (Signed)
 Can you please refer her to dermatology. Dx: Crohns disease

## 2024-07-07 NOTE — Telephone Encounter (Signed)
Thanks so much.  Have a great day!!!

## 2024-07-07 NOTE — Telephone Encounter (Signed)
 Hi, I submitted that auth today.  Have a great weekend

## 2024-07-10 ENCOUNTER — Encounter (INDEPENDENT_AMBULATORY_CARE_PROVIDER_SITE_OTHER): Payer: Self-pay

## 2024-07-13 ENCOUNTER — Telehealth: Payer: Self-pay

## 2024-07-13 NOTE — Telephone Encounter (Signed)
 FREQUENCY CHANGE  Auth Submission: APPROVED Site of care: Site of care: [facility] Payer: [employer] ppo Medication & CPT/J Code(s) submitted: Entyvio  (Vedolizumab ) J3380 Diagnosis Code:  Route of submission (phone, fax, portal): portal Phone # Fax # Auth type: Buy/Bill PB Units/visits requested: 300mg  , q6weeks Reference number: [id number] Approval from: 07/10/24 to 07/09/25

## 2024-07-17 ENCOUNTER — Telehealth (INDEPENDENT_AMBULATORY_CARE_PROVIDER_SITE_OTHER): Payer: Self-pay

## 2024-07-17 ENCOUNTER — Other Ambulatory Visit (INDEPENDENT_AMBULATORY_CARE_PROVIDER_SITE_OTHER): Payer: Self-pay

## 2024-07-17 DIAGNOSIS — R197 Diarrhea, unspecified: Secondary | ICD-10-CM

## 2024-07-17 DIAGNOSIS — K50019 Crohn's disease of small intestine with unspecified complications: Secondary | ICD-10-CM

## 2024-07-17 NOTE — Telephone Encounter (Signed)
 I spoke with the patient and made her aware per Dr. Eartha,  send her a fecal calprotectin order.This may help us  determine if her symptoms are related to her disease. It is very possible that by increasing the dosage, her symptoms will be better controlled.   I have placed the orders for patient to go to [facility] to pick up the containers, and after collecting take back to [facility]. Patient states understanding.

## 2024-07-17 NOTE — Telephone Encounter (Signed)
 Patient calling stating she has a strange feeling in her intestines after eating.  Feels raw like my intestines are inflamed. She says she had mentioned this at her last visit here, but feels it is worsening. She is having multiple watery loose stools per day and says stools are reddish brown, no bright red blood or dark stools seen. Denies any fevers. Patient is now supposed to get her Entyvil every six weeks, she says this was just approved by her insurance. Please advise.

## 2024-07-17 NOTE — Telephone Encounter (Signed)
 Can you please send her a fecal calprotectin order? This may help us  determine if her symptoms are related to her disease. It is very possible that by increasing the dosage, her symptoms will be better controlled Thanks

## 2024-07-19 ENCOUNTER — Encounter (INDEPENDENT_AMBULATORY_CARE_PROVIDER_SITE_OTHER): Payer: Self-pay | Admitting: Gastroenterology

## 2024-07-19 DIAGNOSIS — K50019 Crohn's disease of small intestine with unspecified complications: Secondary | ICD-10-CM | POA: Diagnosis not present

## 2024-07-19 DIAGNOSIS — R197 Diarrhea, unspecified: Secondary | ICD-10-CM | POA: Diagnosis not present

## 2024-07-20 ENCOUNTER — Ambulatory Visit

## 2024-07-20 ENCOUNTER — Ambulatory Visit
Admission: RE | Admit: 2024-07-20 | Discharge: 2024-07-20 | Disposition: A | Discharge status: Home / Self Care | Source: Ambulatory Visit | Attending: Nurse Practitioner | Admitting: Nurse Practitioner

## 2024-07-20 VITALS — BP 128/89 | HR 92 | Temp 98.3°F | Resp 22

## 2024-07-20 DIAGNOSIS — R0781 Pleurodynia: Secondary | ICD-10-CM | POA: Diagnosis not present

## 2024-07-20 DIAGNOSIS — R071 Chest pain on breathing: Secondary | ICD-10-CM | POA: Diagnosis not present

## 2024-07-20 DIAGNOSIS — J4521 Mild intermittent asthma with (acute) exacerbation: Secondary | ICD-10-CM | POA: Diagnosis not present

## 2024-07-20 DIAGNOSIS — J069 Acute upper respiratory infection, unspecified: Secondary | ICD-10-CM | POA: Diagnosis not present

## 2024-07-20 DIAGNOSIS — R509 Fever, unspecified: Secondary | ICD-10-CM | POA: Diagnosis not present

## 2024-07-20 DIAGNOSIS — R059 Cough, unspecified: Secondary | ICD-10-CM | POA: Diagnosis not present

## 2024-07-20 LAB — POC COVID19/FLU A&B COMBO
Covid Antigen, POC: NEGATIVE
Influenza A Antigen, POC: NEGATIVE
Influenza B Antigen, POC: NEGATIVE

## 2024-07-20 MED ORDER — BENZONATATE 100 MG PO CAPS
100.0000 mg | ORAL_CAPSULE | Freq: Three times a day (TID) | ORAL | 0 refills | Status: AC | PRN
Start: 2024-07-20 — End: ?

## 2024-07-20 MED ORDER — ALBUTEROL SULFATE HFA 108 (90 BASE) MCG/ACT IN AERS: INHALATION_SPRAY | RESPIRATORY_TRACT | 9 refills | Status: AC

## 2024-07-20 MED ORDER — PREDNISONE 20 MG PO TABS: 40.0000 mg | ORAL_TABLET | Freq: Every day | ORAL | 0 refills | Status: AC

## 2024-07-20 MED ORDER — IPRATROPIUM-ALBUTEROL 0.5-2.5 (3) MG/3ML IN SOLN
3.0000 mL | Freq: Once | RESPIRATORY_TRACT | Status: AC
  Administered 2024-07-20: 3 mL via RESPIRATORY_TRACT

## 2024-07-20 MED ORDER — ALBUTEROL SULFATE (2.5 MG/3ML) 0.083% IN NEBU: INHALATION_SOLUTION | RESPIRATORY_TRACT | 1 refills | Status: AC

## 2024-07-20 NOTE — Discharge Instructions (Signed)
 You have a viral upper respiratory infection.  COVID-19 and influenza testing is negative today.  The viral infection is causing an asthma exacerbation.  The chest x-ray is negative for pneumonia.  Symptoms should improve over the next week to 10 days.  If you develop chest pain or shortness of breath, go to the emergency room.  Start using the albuterol  inhaler or nebulizer every 4-6 hours scheduled for the next 48 hours, then use as needed.  This will help keep your airway open.  In addition, recommend starting the oral prednisone  and take daily in the morning to help with airway inflammation.  Additionally, recommend hydration with plenty of fluids and Mucinex  600 mg twice daily.  Some things that can make you feel better are: - Increased rest - Increasing fluid with water /sugar free electrolytes - Acetaminophen  as needed for fever/pain - Salt water  gargling, chloraseptic spray and throat lozenges - OTC guaifenesin  (Mucinex ) 600 mg twice daily for congestion - Saline sinus flushes or a neti pot - Humidifying the air -Tessalon  Perles every 8 hours as needed for dry cough

## 2024-07-20 NOTE — ED Provider Notes (Signed)
 CSN: [id number] Arrival date & time: 07/20/24  1211      History   Chief Complaint Chief Complaint  Patient presents with   Cough    Congested and sometimes a runny nose. Symptoms started on yesterday home Covid test was negative that I took on last night. - Entered by patient    HPI [name] is a 58 y.o. female.   Patient presents today with 1 day history of productive cough, congestion, shortness of breath worse with exertion, rib cage pain bilaterally when taking a deep breath or with any exertion.  She also endorses scratchy throat, hoarseness, runny and stuffy nose, decreased appetite, and fatigue.  3 days ago, she had a low-grade fever of 100.3 F but has not had 1 since.  No vomiting or known sick contacts.  She got her flu shot last week and has been taking over-the-counter cough and cold medications to ward off getting sick since then.  She has a history of Crohn's disease, typically gets infusion every 8 weeks.  Reports she is also currently having a Crohn's disease flare and has been in touch with her gastroenterologist.  Patient reports history of asthma as well.  She thinks she has tried butyryl inhaler and nebulizer medicine at home but is unsure if it is within date of expiration.  She is requesting refills today.  Also reports history of double pneumonia that advanced rapidly and feels similarly to when that developed.    Past Medical History:  Diagnosis Date   Anxiety    Arthritis    Asthma    Cervical hyperplasia    hisotry of   Chest pain    cath 2005 norm cors, repeat cath Feb 2011 normal cors and only minimally  elevated pulmonary pressures   Crohn disease (HCC)    Diverticulitis of colon 07/17/2014   Diverticulosis    Dyspnea    GERD (gastroesophageal reflux disease)    Hiatal hernia    History of iron deficiency anemia    Hypertension    Idiopathic intracranial hypertension    Per patient   Migraines    Obesity    Palpitations     Pneumonia 2008   14 days hospitalization, bilateral   PONV (postoperative nausea and vomiting)    Sleep apnea sleep study 11/03/2009    cpap; does sometimes, doesnt use every night. weight loss no longer using CPAP   Vitamin B deficiency     Patient Active Problem List   Diagnosis Date Noted   IBS (irritable bowel syndrome) 04/17/2024   Hematuria 12/27/2022   Right flank pain 12/27/2022   Change in stool caliber 04/29/2022   RUQ pain 04/29/2022   Right lower quadrant abdominal pain 04/27/2022   Change in bowel movement 01/26/2022   GERD (gastroesophageal reflux disease)    Crohn's ileitis (HCC) 07/31/2021   S/P total knee arthroplasty, right 01/23/2021   IIH (idiopathic intracranial hypertension) 10/14/2020   Discoloration of skin of finger 09/12/2020   Bilateral hand pain 09/12/2020   Paresthesia of skin 09/12/2020   Chronic migraine without aura without status migrainosus, not intractable 06/22/2020   Iron deficiency anemia 06/20/2019   S/P left TKA 06/06/2019   Status post total left knee replacement 06/06/2019   Special screening for malignant neoplasms, colon 03/28/2019   LLQ abdominal pain 09/26/2018   Derangement of anterior horn of lateral meniscus of left knee    Derangement of posterior horn of medial meniscus of left knee  Hypokalemia 04/25/2016   Hyperglycemia 04/25/2016   Diverticulitis of colon 07/17/2014   Nausea without vomiting 07/17/2014   History of arthroscopy of right knee 07/25/2012   Difficulty walking 06/29/2012   Weakness of right leg 06/29/2012   Posterior tibial tendonitis 11/11/2011   PTTD (posterior tibial tendon dysfunction) 11/11/2011   Knee pain, right 11/11/2011   Chest pain 07/16/2011   Primary osteoarthritis of left knee 02/25/2010   PAIN IN JOINT, MULTIPLE SITES 02/25/2010   Essential hypertension 08/13/2009   Diaphragmatic hernia 08/13/2009   PALPITATIONS 08/13/2009   DYSPNEA 08/13/2009    Past Surgical History:  Procedure  Laterality Date   ABDOMINAL HYSTERECTOMY     ANKLE ARTHROSCOPY  06/01/2012   Procedure: ANKLE ARTHROSCOPY;  Surgeon: Deward DELENA Schwartz, MD;  Location: [facility];  Service: Orthopedics;  Laterality: Left;  left ankle arthorsocpy with extensive debridement and gastroc slide   ANKLE SURGERY Left 05/11/2018   BIOPSY  04/26/2019   Procedure: BIOPSY;  Surgeon: Golda Claudis PENNER, MD;  Location: [facility];  Service: Endoscopy;;  ileum colon   BIOPSY  03/25/2022   Procedure: BIOPSY;  Surgeon: Eartha Angelia Sieving, MD;  Location: [facility];  Service: Gastroenterology;;   CARDIAC CATHETERIZATION  11/22/2009 and 2005   WNL   CESAREAN SECTION     CHOLECYSTECTOMY  09/08/2006   lap. chole.   CHONDROPLASTY  06/17/2012   Procedure: CHONDROPLASTY;  Surgeon: Taft FORBES Minerva, MD;  Location: [facility];  Service: Orthopedics;  Laterality: Right;  right patella   COLONOSCOPY N/A 04/26/2019   Rehman: a few erosions in ternimal ileum, two erosions in cecum (assocaited with acute inflammation) diverticulosis in sigmoid hepatic flexure and ascending colon, external hemorrhoids   COLONOSCOPY WITH PROPOFOL  N/A 03/25/2022   Procedure: COLONOSCOPY WITH PROPOFOL ;  Surgeon: Eartha Angelia Sieving, MD;  Location: [facility];  Service: Gastroenterology;  Laterality: N/A;  830 ASA 1   ESOPHAGOGASTRODUODENOSCOPY N/A 05/14/2015   Procedure: ESOPHAGOGASTRODUODENOSCOPY (EGD);  Surgeon: Claudis PENNER Golda, MD;  Location: [facility];  Service: Endoscopy;  Laterality: N/A;  730   ESOPHAGOGASTRODUODENOSCOPY (EGD) WITH PROPOFOL  N/A 03/25/2022   Procedure: ESOPHAGOGASTRODUODENOSCOPY (EGD) WITH PROPOFOL ;  Surgeon: Eartha Angelia Sieving, MD;  Location: [facility];  Service: Gastroenterology;  Laterality: N/A;   GIVENS CAPSULE STUDY N/A 05/24/2019   Procedure: GIVENS CAPSULE STUDY;  Surgeon: Golda Claudis PENNER, MD;  Location: [facility];  Service: Endoscopy;  Laterality: N/A;  7:30   INGUINAL HERNIA  REPAIR  10/30/2008   right   KNEE ARTHROSCOPY Right 2013   KNEE ARTHROSCOPY WITH LATERAL MENISECTOMY Left 12/23/2016   Procedure: LEFT KNEE ARTHROSCOPY WITH LATERAL MENISECTOMY;  Surgeon: Taft FORBES Minerva, MD;  Location: [facility];  Service: Orthopedics;  Laterality: Left;   KNEE ARTHROSCOPY WITH MEDIAL MENISECTOMY Left 12/23/2016   Procedure: LEFT KNEE ARTHROSCOPY WITH MEDIAL MENISECTOMY CHONDROPLASTY PATELLA  AND MEDIAL FEMORAL CONDYLE LEFT KNEE;  Surgeon: Taft FORBES Minerva, MD;  Location: [facility];  Service: Orthopedics;  Laterality: Left;   POLYPECTOMY  03/25/2022   Procedure: POLYPECTOMY;  Surgeon: Eartha Angelia Sieving, MD;  Location: [facility];  Service: Gastroenterology;;   SHOULDER ARTHROSCOPY WITH SUBACROMIAL DECOMPRESSION AND BICEP TENDON REPAIR Left 12/21/2017   Procedure: Left shoulder arthroscopic biceps tenodesis, SAD, DCR and labrum debridement;  Surgeon: Sharl Selinda Dover, MD;  Location: [facility];  Service: Orthopedics;  Laterality: Left;  120 mins   SHOULDER SURGERY     right x 2    TOTAL KNEE  ARTHROPLASTY Left 06/06/2019   Procedure: TOTAL KNEE ARTHROPLASTY;  Surgeon: Ernie Cough, MD;  Location: [facility];  Service: Orthopedics;  Laterality: Left;  70 mins   TOTAL KNEE ARTHROPLASTY Right 01/23/2021   Procedure: TOTAL KNEE ARTHROPLASTY;  Surgeon: Ernie Cough, MD;  Location: [facility];  Service: Orthopedics;  Laterality: Right;  70 mins    OB History   No obstetric history on file.      Home Medications    Prior to Admission medications   Medication Sig Start Date End Date Taking? Authorizing Provider  benzonatate  (TESSALON ) 100 MG capsule Take 1 capsule (100 mg total) by mouth 3 (three) times daily as needed for cough. Do not take with alcohol or while operating or driving heavy machinery 89/83/74  Yes Chandra Harlene LABOR, NP  predniSONE  (DELTASONE ) 20 MG tablet Take 2 tablets (40 mg total) by mouth daily with breakfast for 5 days. 07/20/24 07/25/24 Yes Chandra Harlene LABOR, NP  acetaminophen  (TYLENOL ) 500 MG tablet Take 2 tablets (1,000 mg total) by mouth every 8 (eight) hours. Patient taking differently: Take 1,000 mg by mouth as needed. 06/07/19   Danella Cough, PA-C  acetaZOLAMIDE  ER (DIAMOX ) 500 MG capsule Take 1 capsule (500 mg total) by mouth every morning AND 2 capsules (1,000 mg total) every evening. 05/01/24   Lomax, Amy, NP  albuterol  (PROVENTIL ) (2.5 MG/3ML) 0.083% nebulizer solution Inhale the contents of 1 vial via nebulizer every 6 hours as needed 07/20/24   Chandra Harlene LABOR, NP  albuterol  (VENTOLIN  HFA) 108 (90 Base) MCG/ACT inhaler Inhale 1-2 puffs by mouth every 4 hours as needed 07/20/24   Chandra Harlene LABOR, NP  amitriptyline  (ELAVIL ) 25 MG tablet Take 1 tablet (25 mg total) by mouth at bedtime. 05/01/24   Lomax, Amy, NP  amLODipine  (NORVASC ) 5 MG tablet Take 1 tablet (5 mg total) by mouth daily. 06/08/23 07/06/24    Cyanocobalamin  (VITAMIN B-12 IJ) Inject 1 Dose as directed every 30 (thirty) days.    [provider]  cyclobenzaprine  (FLEXERIL ) 5 MG tablet Take 1 tablet (5 mg total) by mouth every 8 (eight) hours as needed for muscle spasms. 06/07/23     diclofenac  Sodium (VOLTAREN ) 1 % GEL Apply 1 Application topically 4 (four) times daily as needed (pain).    [provider]  diphenhydrAMINE  (BENADRYL ) 25 MG tablet Take 25-50 mg by mouth every 6 (six) hours as needed for allergies.    [provider]  EPINEPHrine  (EPIPEN  2-PAK) 0.3 mg/0.3 mL IJ SOAJ injection Inject as directed for anaphylaxis. Patient taking differently: as needed. 08/24/23     famotidine  (PEPCID ) 40 MG tablet Take 1 tablet by mouth at bedtime. Needs office visit. 04/05/24   Carlan, Chelsea L, NP  fluticasone  (FLONASE ) 50 MCG/ACT nasal spray Place 2 sprays into both nostrils daily. Patient taking differently: Place 2 sprays into both nostrils daily. As needed. 06/28/23   Soldatova, Liuba, MD  Fremanezumab -vfrm (AJOVY ) 225 MG/1.5ML SOAJ Inject 225 mg  into the skin every 30 (thirty) days. 05/01/24   Lomax, Amy, NP  hyoscyamine  (LEVSIN  SL) 0.125 MG SL tablet Dissolve 1 tablet under the tongue 3 times daily before meals. Patient taking differently: Place 0.125 mg under the tongue 3 (three) times daily before meals. As needed. 07/22/22   Carlan, Chelsea L, NP  ibuprofen  (ADVIL ) 600 MG tablet Take 1 tablet (600 mg total) by mouth every 6 (six) hours as needed. 06/28/24   Horton, Charmaine FALCON, MD  loratadine  (CLARITIN ) 10 MG tablet Take 10  mg by mouth at bedtime.    [provider]  losartan  (COZAAR ) 100 MG tablet Take 1 tablet (100 mg total) by mouth daily. 08/23/23     meclizine  (ANTIVERT ) 25 MG tablet Take 25 mg by mouth 3 (three) times daily as needed for dizziness.    [provider]  montelukast  (SINGULAIR ) 10 MG tablet Take 1 tablet (10 mg total) by mouth daily. 06/09/23     omeprazole  (PRILOSEC) 40 MG capsule Take 1 capsule (40 mg total) by mouth daily. 04/05/24   Carlan, Chelsea L, NP  ondansetron  (ZOFRAN -ODT) 4 MG disintegrating tablet Dissolve 1 tablet by mouth every 8 hours as needed for nausea or vomiting. 04/05/24   Carlan, Chelsea L, NP  oxyCODONE -acetaminophen  (PERCOCET/ROXICET) 5-325 MG tablet Take 1 tablet by mouth every 6 (six) hours as needed for severe pain (pain score 7-10). 06/28/24   Horton, Charmaine FALCON, MD  polyethylene glycol (MIRALAX  / GLYCOLAX ) 17 g packet Take 17 g by mouth daily. Patient taking differently: Take 17 g by mouth as needed.    [provider]  rizatriptan  (MAXALT -MLT) 10 MG disintegrating tablet Dissolve 1 tablet (10 mg total) by mouth as needed for migraine. May repeat in 2 hours if needed 05/01/24   Lomax, Amy, NP  tamsulosin  (FLOMAX ) 0.4 MG CAPS capsule Take 1 capsule (0.4 mg total) by mouth daily. 06/28/24   Horton, Charmaine FALCON, MD  vedolizumab  (ENTYVIO ) 300 MG injection Inject 300 mg into the vein every 8 (eight) weeks. 11/11/21   Golda Claudis PENNER, MD    Family History Family History   Problem Relation Age of Onset   Hypertension Mother    Atrial fibrillation Mother    Arthritis Mother    Lupus Father    COPD Father    Rheum arthritis Father    Sarcoidosis Father    Lupus Sister    Congestive Heart Failure Maternal Grandmother    Migraines Neg Hx    Headache Neg Hx     Social History Social History   Tobacco Use   Smoking status: Never    Passive exposure: Never   Smokeless tobacco: Never  Vaping Use   Vaping status: Never Used  Substance Use Topics   Alcohol use: No    Alcohol/week: 0.0 standard drinks of alcohol   Drug use: Never     Allergies   Bee pollen, Hydromorphone  hcl, Iodinated contrast media, Other, Wasp venom, Pollen extract, Adhesive [tape], and Omnipaque  [iohexol ]   Review of Systems Review of Systems Per HPI  Physical Exam Triage Vital Signs ED Triage Vitals  Encounter Vitals Group     BP 07/20/24 1218 (!) 155/98     Girls Systolic BP Percentile --      Girls Diastolic BP Percentile --      Boys Systolic BP Percentile --      Boys Diastolic BP Percentile --      Pulse Rate 07/20/24 1218 98     Resp 07/20/24 1218 (!) 22     Temp 07/20/24 1218 98.3 F (36.8 C)     Temp Source 07/20/24 1218 Oral     SpO2 07/20/24 1218 97 %     Weight --      Height --      Head Circumference --      Peak Flow --      Pain Score 07/20/24 1222 4     Pain Loc --      Pain Education --  Exclude from Growth Chart --    No data found.  Updated Vital Signs BP 128/89 (BP Location: Right Arm)   Pulse 92   Temp 98.3 F (36.8 C) (Oral)   Resp (!) 22   SpO2 98%   Visual Acuity Right Eye Distance:   Left Eye Distance:   Bilateral Distance:    Right Eye Near:   Left Eye Near:    Bilateral Near:     Physical Exam Vitals and nursing note reviewed.  Constitutional:      General: She is not in acute distress.    Appearance: Normal appearance. She is not ill-appearing or toxic-appearing.  HENT:     Head: Normocephalic and  atraumatic.     Right Ear: Tympanic membrane, ear canal and external ear normal.     Left Ear: Tympanic membrane, ear canal and external ear normal.     Nose: Rhinorrhea present. No congestion.     Mouth/Throat:     Mouth: Mucous membranes are moist.     Pharynx: Oropharynx is clear. No oropharyngeal exudate or posterior oropharyngeal erythema.  Eyes:     General: No scleral icterus.    Extraocular Movements: Extraocular movements intact.  Cardiovascular:     Rate and Rhythm: Normal rate and regular rhythm.  Pulmonary:     Effort: Pulmonary effort is normal. Tachypnea present. No respiratory distress.     Breath sounds: Normal breath sounds. Decreased air movement present.  Musculoskeletal:     Cervical back: Normal range of motion and neck supple.  Lymphadenopathy:     Cervical: No cervical adenopathy.  Skin:    General: Skin is warm and dry.     Coloration: Skin is not jaundiced or pale.     Findings: No erythema or rash.  Neurological:     Mental Status: She is alert and oriented to person, place, and time.  Psychiatric:        Behavior: Behavior is cooperative.      UC Treatments / Results  Labs (all labs ordered are listed, but only abnormal results are displayed) Labs Reviewed  POC COVID19/FLU A&B COMBO    EKG   Radiology DG Chest 2 View Result Date: 07/20/2024 EXAM: 2 VIEW(S) XRAY OF THE CHEST 07/20/2024 12:57:43 PM COMPARISON: 02/08/2024 CLINICAL HISTORY: cough, pain with inspiration. Table formatting from the original note was not included.; Pt reports congestion,cough, sinus drainage, took home COVID test it was negative. Low grade fever on Monday night, yesterday cough started. Pain in the ribs. FINDINGS: LUNGS AND PLEURA: No focal pulmonary opacity. No pulmonary edema. No pleural effusion. No pneumothorax. HEART AND MEDIASTINUM: No acute abnormality of the cardiac and mediastinal silhouettes. BONES AND SOFT TISSUES: No acute osseous abnormality. IMPRESSION: 1.  No acute cardiopulmonary disease. Electronically signed by: Lynwood Seip MD 07/20/2024 01:41 PM EDT RP Workstation: [id number]    Procedures Procedures (including critical care time)  Medications Ordered in UC Medications  ipratropium-albuterol  (DUONEB) 0.5-2.5 (3) MG/3ML nebulizer solution 3 mL (3 mLs Nebulization Given 07/20/24 1258)    Initial Impression / Assessment and Plan / UC Course  I have reviewed the triage vital signs and the nursing notes.  Pertinent labs & imaging results that were available during my care of the patient were reviewed by me and considered in my medical decision making (see chart for details).   In triage, patient is mildly hypertensive and tachypneic.  This improves with rest and after breathing treatment.  1. Mild intermittent asthma with acute exacerbation 2.  Viral URI with cough Suspect asthma exacerbation due to viral illness COVID-19 and influenza testing is negative DuoNeb was given with improvement in air movement bilaterally; no wheezing, rhonchi, rales heard after breathing treatment Chest x-ray is negative for pneumonia today Will treat for asthma exacerbation with bronchodilators, expectorant, pushing hydration, oral corticosteroid Return and ER precautions discussed Work excuse provided  The patient was given the opportunity to ask questions.  All questions answered to their satisfaction.  The patient is in agreement to this plan.   Final Clinical Impressions(s) / UC Diagnoses   Final diagnoses:  Mild intermittent asthma with acute exacerbation  Viral URI with cough     Discharge Instructions      You have a viral upper respiratory infection.  COVID-19 and influenza testing is negative today.  The viral infection is causing an asthma exacerbation.  The chest x-ray is negative for pneumonia.  Symptoms should improve over the next week to 10 days.  If you develop chest pain or shortness of breath, go to the emergency room.  Start using  the albuterol  inhaler or nebulizer every 4-6 hours scheduled for the next 48 hours, then use as needed.  This will help keep your airway open.  In addition, recommend starting the oral prednisone  and take daily in the morning to help with airway inflammation.  Additionally, recommend hydration with plenty of fluids and Mucinex  600 mg twice daily.  Some things that can make you feel better are: - Increased rest - Increasing fluid with water /sugar free electrolytes - Acetaminophen  as needed for fever/pain - Salt water  gargling, chloraseptic spray and throat lozenges - OTC guaifenesin  (Mucinex ) 600 mg twice daily for congestion - Saline sinus flushes or a neti pot - Humidifying the air -Tessalon  Perles every 8 hours as needed for dry cough      ED Prescriptions     Medication Sig Dispense Auth. Provider   albuterol  (PROVENTIL ) (2.5 MG/3ML) 0.083% nebulizer solution Inhale the contents of 1 vial via nebulizer every 6 hours as needed 90 mL Chandra Raisin A, NP   albuterol  (VENTOLIN  HFA) 108 (90 Base) MCG/ACT inhaler Inhale 1-2 puffs by mouth every 4 hours as needed 6.7 g Chandra Raisin A, NP   benzonatate  (TESSALON ) 100 MG capsule Take 1 capsule (100 mg total) by mouth 3 (three) times daily as needed for cough. Do not take with alcohol or while operating or driving heavy machinery 60 capsule Chandra Raisin A, NP   predniSONE  (DELTASONE ) 20 MG tablet Take 2 tablets (40 mg total) by mouth daily with breakfast for 5 days. 10 tablet Chandra Raisin LABOR, NP      PDMP not reviewed this encounter.   Chandra Raisin LABOR, NP 07/20/24 1353

## 2024-07-20 NOTE — ED Triage Notes (Addendum)
 Pt reports congestion,cough, sinus drainage, took home COVID test it was negative. Low grade fever on Monday night, yesterday cough started. Pain in the ribs.

## 2024-07-21 ENCOUNTER — Other Ambulatory Visit (HOSPITAL_COMMUNITY): Payer: Self-pay

## 2024-07-21 ENCOUNTER — Ambulatory Visit (INDEPENDENT_AMBULATORY_CARE_PROVIDER_SITE_OTHER): Payer: Self-pay | Admitting: Gastroenterology

## 2024-07-21 DIAGNOSIS — R109 Unspecified abdominal pain: Secondary | ICD-10-CM

## 2024-07-21 LAB — CALPROTECTIN, FECAL: Calprotectin, Fecal: 41 ug/g (ref 0–120)

## 2024-07-21 MED ORDER — DICYCLOMINE HCL 10 MG PO CAPS
10.0000 mg | ORAL_CAPSULE | Freq: Two times a day (BID) | ORAL | 1 refills | Status: AC | PRN
  Filled 2024-07-21: qty 180, 90d supply, fill #0

## 2024-07-26 ENCOUNTER — Other Ambulatory Visit (HOSPITAL_COMMUNITY): Payer: Self-pay

## 2024-07-26 ENCOUNTER — Ambulatory Visit: Admitting: Family Medicine

## 2024-07-26 ENCOUNTER — Other Ambulatory Visit (INDEPENDENT_AMBULATORY_CARE_PROVIDER_SITE_OTHER): Payer: Self-pay | Admitting: Otolaryngology

## 2024-07-26 ENCOUNTER — Other Ambulatory Visit (INDEPENDENT_AMBULATORY_CARE_PROVIDER_SITE_OTHER): Payer: Self-pay

## 2024-07-26 MED ORDER — CYCLOBENZAPRINE HCL 5 MG PO TABS
5.0000 mg | ORAL_TABLET | Freq: Three times a day (TID) | ORAL | 2 refills | Status: AC | PRN
  Filled 2024-07-26: qty 40, 14d supply, fill #0

## 2024-07-26 MED FILL — Fluticasone Propionate Nasal Susp 50 MCG/ACT: NASAL | 30 days supply | Qty: 16 | Fill #0 | Status: AC

## 2024-07-27 ENCOUNTER — Encounter: Attending: Gastroenterology | Admitting: Internal Medicine

## 2024-07-27 ENCOUNTER — Other Ambulatory Visit (HOSPITAL_COMMUNITY): Payer: Self-pay

## 2024-07-27 ENCOUNTER — Other Ambulatory Visit: Payer: Self-pay

## 2024-07-27 VITALS — BP 132/84 | HR 94 | Temp 97.5°F | Resp 16

## 2024-07-27 DIAGNOSIS — K50019 Crohn's disease of small intestine with unspecified complications: Secondary | ICD-10-CM | POA: Diagnosis not present

## 2024-07-27 MED ORDER — VEDOLIZUMAB 300 MG IV SOLR
300.0000 mg | Freq: Once | INTRAVENOUS | Status: AC
  Administered 2024-07-27: 300 mg via INTRAVENOUS
  Filled 2024-07-27: qty 5

## 2024-07-27 NOTE — Progress Notes (Signed)
 Diagnosis: Crohn's Disease  Provider:  Eartha Sieving MD  Procedure: IV Infusion  IV Type: Peripheral, IV Location: L Antecubital  Entyvio  (Vedolizumab ), Dose: 300 mg  Infusion Start Time: 1516  Infusion Stop Time: 1550  Post Infusion IV Care: Observation period completed  Discharge: Condition: Good, Destination: Home . AVS Provided  Performed by:  Blanca Selinda SAUNDERS, LPN

## 2024-08-08 ENCOUNTER — Other Ambulatory Visit (HOSPITAL_COMMUNITY): Payer: Self-pay

## 2024-08-08 DIAGNOSIS — R310 Gross hematuria: Secondary | ICD-10-CM | POA: Diagnosis not present

## 2024-08-08 DIAGNOSIS — R399 Unspecified symptoms and signs involving the genitourinary system: Secondary | ICD-10-CM | POA: Diagnosis not present

## 2024-08-08 DIAGNOSIS — N201 Calculus of ureter: Secondary | ICD-10-CM | POA: Diagnosis not present

## 2024-08-08 MED ORDER — TAMSULOSIN HCL 0.4 MG PO CAPS
0.4000 mg | ORAL_CAPSULE | Freq: Every day | ORAL | 0 refills | Status: DC
  Filled 2024-08-08: qty 30, 30d supply, fill #0

## 2024-08-09 ENCOUNTER — Other Ambulatory Visit (HOSPITAL_COMMUNITY): Payer: Self-pay

## 2024-08-10 ENCOUNTER — Ambulatory Visit

## 2024-08-16 DIAGNOSIS — N201 Calculus of ureter: Secondary | ICD-10-CM | POA: Diagnosis not present

## 2024-08-16 DIAGNOSIS — N202 Calculus of kidney with calculus of ureter: Secondary | ICD-10-CM | POA: Diagnosis not present

## 2024-08-17 ENCOUNTER — Other Ambulatory Visit: Payer: Self-pay | Admitting: Urology

## 2024-08-17 DIAGNOSIS — R399 Unspecified symptoms and signs involving the genitourinary system: Secondary | ICD-10-CM | POA: Diagnosis not present

## 2024-08-17 DIAGNOSIS — N201 Calculus of ureter: Secondary | ICD-10-CM | POA: Diagnosis not present

## 2024-08-18 ENCOUNTER — Telehealth: Payer: Self-pay | Admitting: Cardiovascular Disease

## 2024-08-18 ENCOUNTER — Other Ambulatory Visit (HOSPITAL_COMMUNITY): Payer: Self-pay

## 2024-08-18 MED ORDER — OXYCODONE-ACETAMINOPHEN 5-325 MG PO TABS
1.0000 | ORAL_TABLET | Freq: Three times a day (TID) | ORAL | 0 refills | Status: AC | PRN
  Filled 2024-08-18: qty 15, 5d supply, fill #0

## 2024-08-18 NOTE — Telephone Encounter (Signed)
 S/w the pt and she has been scheduled to see Lum Louis, NP 08/23/24 @ 10:55 for preop clearance.

## 2024-08-18 NOTE — Telephone Encounter (Signed)
   Name: [name]  DOB: 04-15-1966  MRN: [id number]  Primary Cardiologist: Maude Emmer, MD  Chart reviewed as part of pre-operative protocol coverage. She was last seen by [name], PA-C, in 03/2024  at which time she reported chest pain that was felt to be atypical. Coronary CTA was offered but patient declined. Therefore, I think she would benefit from an in-office appointment for further risk stratification in case she is still having symptoms.   Pre-op covering staff: - Please schedule appointment and call patient to inform them. If patient already had an upcoming appointment within acceptable timeframe, please add pre-op clearance to the appointment notes so provider is aware. - Please contact requesting surgeon's office via preferred method (i.e, phone, fax) to inform them of need for appointment prior to surgery.  Lj Miyamoto E Ziare Cryder, PA-C  08/18/2024, 9:59 AM

## 2024-08-18 NOTE — Telephone Encounter (Signed)
   Pre-operative Risk Assessment    Patient Name: [name]  DOB: [birth year] MRN: [id number]   Date of last office visit: 03/20/24 Date of next office visit: Not yet scheduled   Request for Surgical Clearance    Procedure:  Left Ureteroscopy with Stent Placement  Date of Surgery:  Clearance 09/12/24                                Surgeon:  Dr. Ronal Leeroy Shank Surgeon's Group or Practice Name:  [facility] Phone number:  [phone]  Fax number:  [phone]   Type of Clearance Requested:   - Medical    Type of Anesthesia:  General    Additional requests/questions:  Caller Lundy) stated patient will need medical clearance.    Signed, Jasmin B Wilson   08/18/2024, 8:11 AM

## 2024-08-22 ENCOUNTER — Encounter: Payer: Self-pay | Admitting: *Deleted

## 2024-08-22 DIAGNOSIS — E66812 Obesity, class 2: Secondary | ICD-10-CM | POA: Diagnosis not present

## 2024-08-22 DIAGNOSIS — D511 Vitamin B12 deficiency anemia due to selective vitamin B12 malabsorption with proteinuria: Secondary | ICD-10-CM | POA: Diagnosis not present

## 2024-08-22 DIAGNOSIS — Z6837 Body mass index (BMI) 37.0-37.9, adult: Secondary | ICD-10-CM | POA: Diagnosis not present

## 2024-08-22 DIAGNOSIS — Z87448 Personal history of other diseases of urinary system: Secondary | ICD-10-CM | POA: Diagnosis not present

## 2024-08-22 DIAGNOSIS — G43709 Chronic migraine without aura, not intractable, without status migrainosus: Secondary | ICD-10-CM | POA: Diagnosis not present

## 2024-08-22 DIAGNOSIS — K50018 Crohn's disease of small intestine with other complication: Secondary | ICD-10-CM | POA: Diagnosis not present

## 2024-08-22 DIAGNOSIS — Z87442 Personal history of urinary calculi: Secondary | ICD-10-CM | POA: Diagnosis not present

## 2024-08-22 DIAGNOSIS — Z23 Encounter for immunization: Secondary | ICD-10-CM | POA: Diagnosis not present

## 2024-08-23 ENCOUNTER — Encounter: Payer: Self-pay | Admitting: Emergency Medicine

## 2024-08-23 ENCOUNTER — Other Ambulatory Visit (HOSPITAL_COMMUNITY): Payer: Self-pay

## 2024-08-23 ENCOUNTER — Ambulatory Visit: Attending: Emergency Medicine | Admitting: Emergency Medicine

## 2024-08-23 ENCOUNTER — Other Ambulatory Visit (INDEPENDENT_AMBULATORY_CARE_PROVIDER_SITE_OTHER): Payer: Self-pay

## 2024-08-23 VITALS — BP 116/80 | HR 77 | Ht 71.0 in | Wt 264.0 lb

## 2024-08-23 DIAGNOSIS — I340 Nonrheumatic mitral (valve) insufficiency: Secondary | ICD-10-CM | POA: Diagnosis not present

## 2024-08-23 DIAGNOSIS — R072 Precordial pain: Secondary | ICD-10-CM

## 2024-08-23 DIAGNOSIS — I10 Essential (primary) hypertension: Secondary | ICD-10-CM

## 2024-08-23 DIAGNOSIS — Z01810 Encounter for preprocedural cardiovascular examination: Secondary | ICD-10-CM | POA: Diagnosis not present

## 2024-08-23 NOTE — Patient Instructions (Signed)
 Medication Instructions:  NO CHANGES  Lab Work: NONE TO BE DONE TODAY.  Testing/Procedures: NONE  Follow-Up: At [facility], you and your health needs are our priority.  As part of our continuing mission to provide you with exceptional heart care, our providers are all part of one team.  This team includes your primary Cardiologist (physician) and Advanced Practice Providers or APPs (Physician Assistants and Nurse Practitioners) who all work together to provide you with the care you need, when you need it.  Your next appointment:   1 YEAR  Provider:   Maude Emmer, MD OR [facility]

## 2024-08-23 NOTE — Progress Notes (Signed)
 Cardiology Office Note:    Date:  08/23/2024  ID:  [name], DOB Oct 12, 1965, MRN [id number] PCP: Marvine Rush, MD  [facility] Providers Cardiologist:  Maude Emmer, MD Cardiology APP:  Rana Lum CROME, NP       Patient Profile:       Chief Complaint: Preoperative cardiovascular clearance History of Present Illness:  [name] is a 58 y.o. female with visit-pertinent history of chest pains, Crohn's disease, GERD, hypertension  Patient reportedly underwent cardiac catheterization in 2005 and 2011 that showed normal coronary anatomy.  Patient established with cardiology on 12/02/2022 with complaints of chest pains.  She underwent echocardiogram on 12/2022 with LVEF 60 to 65%, no RWMA, mild asymmetric LVH of the basal septal segment, normal diastolic parameters, RV function and size normal, normal PASP, mild mitral valve regurgitation.  Cardiac PET stress 03/2023 showed no evidence of ischemia or infarction.  He was last seen in clinic on 03/20/2024.  She continued to experience atypical chest pain.  She was to follow-up with her PCP and GI.  She was to follow-up as needed.  She is now pending left ureteroscopy with stent placement on 09/12/2024 with [facility].   Discussed the use of AI scribe software for clinical note transcription with the patient, who gave verbal consent to proceed.  History of Present Illness [name] [name] is a 58 year old female who presents for cardiology clearance prior to a kidney procedure.  Today patient is doing well without any acute cardiovascular concerns or complaints today.  She tells me her chest pains has resolved.  She is without dyspnea, orthopnea, PND.  She experiences PVCs/palpitations and occasionally monitors an elevated heart rate using a smartwatch.  She reports her palpitations are stable and infrequent.  She remains active and is without any exertional symptoms.  Family history is significant for heart  disease, with her mother and grandmother having heart-related issues. She is proactive in monitoring her health due to this family history.  She is without orthopnea, PND, LEE, syncope or presyncope   Review of systems:  Please see the history of present illness. All other systems are reviewed and otherwise negative.      Studies Reviewed:    EKG Interpretation Date/Time:  Wednesday August 23 2024 10:52:49 EST Ventricular Rate:  77 PR Interval:  184 QRS Duration:  86 QT Interval:  404 QTC Calculation: 457 R Axis:   -4  Text Interpretation: Normal sinus rhythm Nonspecific T wave abnormality When compared with ECG of 08-Feb-2024 18:05, PREVIOUS ECG IS PRESENT Confirmed by Rana Lum [id number] on 08/23/2024 11:04:32 AM    Cardiac PET 03/30/2023   LV perfusion is normal. There is no evidence of ischemia. There is no evidence of infarction.   Rest left ventricular function is normal. Rest EF: 63 %. Stress left ventricular function is normal. Stress EF: 69 %. End diastolic cavity size is normal. End systolic cavity size is normal.   Myocardial blood flow was computed to be 0.35ml/g/min at rest and 2.25ml/g/min at stress. Global myocardial blood flow reserve was 2.85 and was normal.   Coronary calcium  was absent on the attenuation correction CT images.   The study is normal. The study is low risk.  Echocardiogram 12/28/2022 1. Left ventricular ejection fraction, by estimation, is 60 to 65%. The  left ventricle has normal function. The left ventricle has no regional  wall motion abnormalities. There is mild asymmetric left ventricular  hypertrophy of the basal-septal segment.  Left ventricular diastolic parameters were normal.   2. Right ventricular systolic function is normal. The right ventricular  size is normal. There is normal pulmonary artery systolic pressure. The  estimated right ventricular systolic pressure is 22.5 mmHg.   3. The mitral valve is normal in structure. Mild  mitral valve  regurgitation. No evidence of mitral stenosis.   4. The aortic valve is tricuspid. Aortic valve regurgitation is not  visualized. No aortic stenosis is present.   5. The inferior vena cava is normal in size with greater than 50%  respiratory variability, suggesting right atrial pressure of 3 mmHg.    Risk Assessment/Calculations:              Physical Exam:   VS:  BP 116/80 (BP Location: Left Arm, Patient Position: Sitting, Cuff Size: Normal)   Pulse 77   Ht 5' 11 (1.803 m)   Wt 264 lb (119.7 kg)   BMI 36.82 kg/m    Wt Readings from Last 3 Encounters:  08/23/24 264 lb (119.7 kg)  07/06/24 269 lb 11.2 oz (122.3 kg)  06/27/24 268 lb 11.9 oz (121.9 kg)    GEN: Well nourished, well developed in no acute distress NECK: No JVD; No carotid bruits CARDIAC: RRR, no murmurs, rubs, gallops RESPIRATORY:  Clear to auscultation without rales, wheezing or rhonchi  ABDOMEN: Soft, non-tender, non-distended EXTREMITIES:  No edema; No acute deformity      Assessment and Plan:  Atypical chest pain Has had atypical symptoms in the past Cardiac cath in 2005 and 2011 showed normal coronaries Cardiac PET stress 03/2023 was without ischemia or infarction.  Coronary calcium  was absent from CT images Echocardiogram 12/2022 with LVEF 60 to 65%, no RWMA - Today patient denies chest pains or exertional symptoms.  No indication for further intervention or testing at this time - She has had a very reassuring workup showing no CAD - Do suspect her symptoms coincided with her extensive GI history  Mitral valve regurgitation Echocardiogram 12/2022 showed mild mitral valve regurgitation - Today she is asymptomatic - Can repeat echocardiogram within the next 3 to 5 years for routine monitoring  Hypertension Blood pressure today is 116/80 and well-controlled on - Continue amlodipine  5 mg daily and losartan  100 mg daily  Crohn's disease - History of GERD, IBS, diverticulosis and multiple  episodes of diverticulitis in the past.  Also has hiatal hernia which can contribute to her atypical chest pains - Management per GI  Preoperative cardiovascular clearance According to the Revised Cardiac Risk Index (RCRI), her Perioperative Risk of Major Cardiac Event is (%): 0.4. Her Functional Capacity in METs is: 5.62 according to the Duke Activity Status Index (DASI). Therefore, based on ACC/AHA guidelines, patient would be at acceptable risk for the planned procedure without further cardiovascular testing. I will route this recommendation to the requesting party via Epic fax function.      Dispo:  Return in about 1 year (around 08/23/2025).  Signed, Lum LITTIE Louis, NP

## 2024-09-04 ENCOUNTER — Ambulatory Visit
Admission: RE | Admit: 2024-09-04 | Discharge: 2024-09-04 | Disposition: A | Source: Ambulatory Visit | Attending: Family Medicine | Admitting: Family Medicine

## 2024-09-04 DIAGNOSIS — N6320 Unspecified lump in the left breast, unspecified quadrant: Secondary | ICD-10-CM

## 2024-09-04 DIAGNOSIS — R928 Other abnormal and inconclusive findings on diagnostic imaging of breast: Secondary | ICD-10-CM | POA: Diagnosis not present

## 2024-09-04 NOTE — Patient Instructions (Signed)
 SURGICAL WAITING ROOM VISITATION Patients having surgery or a procedure may have no more than 2 support people in the waiting area - these visitors may rotate in the visitor waiting room.   Due to an increase in RSV and influenza rates and associated hospitalizations, children ages 62 and under may not visit patients in [facility] hospitals. If the patient needs to stay at the hospital during part of their recovery, the visitor guidelines for inpatient rooms apply.  PRE-OP VISITATION  Pre-op nurse will coordinate an appropriate time for 1 support person to accompany the patient in pre-op.  This support person may not rotate.  This visitor will be contacted when the time is appropriate for the visitor to come back in the pre-op area.  Please refer to the [facility] website for the visitor guidelines for Inpatients (after your surgery is over and you are in a regular room).  You are not required to quarantine at this time prior to your surgery. However, you must do this: Hand Hygiene often Do NOT share personal items Notify your provider if you are in close contact with someone who has COVID or you develop fever 100.4 or greater, new onset of sneezing, cough, sore throat, shortness of breath or body aches.  If you test positive for Covid or have been in contact with anyone that has tested positive in the last 10 days please notify you surgeon.    Your procedure is scheduled on: 09/12/24   Report to [facility] Main Entrance: [place] entrance where the [employer] is available.   Report to admitting at: 10:45 AM  Call this number if you have any questions or problems the morning of surgery [phone]  FOLLOW ANY ADDITIONAL PRE OP INSTRUCTIONS YOU RECEIVED FROM YOUR SURGEON'S OFFICE!!!  Do not eat food after Midnight the night prior to your surgery/procedure.  After Midnight you may have the following liquids until: 10:00 AM DAY OF SURGERY  Clear Liquid Diet Water  Black  Coffee (sugar ok, NO MILK/CREAM OR CREAMERS)  Tea (sugar ok, NO MILK/CREAM OR CREAMERS) regular and decaf                             Plain Jell-O  with no fruit (NO RED)                                           Fruit ices (not with fruit pulp, NO RED)                                     Popsicles (NO RED)                                                                  Juice: NO CITRUS JUICES: only apple, WHITE grape, WHITE cranberry Sports drinks like Gatorade or Powerade (NO RED)   Oral Hygiene is also important to reduce your risk of infection.        Remember - BRUSH YOUR TEETH THE MORNING OF SURGERY WITH YOUR REGULAR TOOTHPASTE  Do NOT smoke after Midnight the night before surgery.  STOP TAKING all Vitamins, Herbs and supplements 1 week before your surgery.   Take ONLY these medicines the morning of surgery with A SIP OF WATER : amlodipine ,omeprazole ,montelukast ,tamsulosin .Tylenol ,meclizine ,rizatriptan  as needed.Use inhalers as usual and bring them.                   You may not have any metal on your body including hair pins, jewelry, and body piercing  Do not wear make-up, lotions, powders, perfumes / cologne, or deodorant  Do not wear nail polish including gel and S&S, artificial / acrylic nails, or any other type of covering on natural nails including finger and toenails. If you have artificial nails, gel coating, etc., that needs to be removed by a nail salon, Please have this removed prior to surgery. Not doing so may mean that your surgery could be cancelled or delayed if the Surgeon or anesthesia staff feels like they are unable to monitor you safely.   Do not shave 48 hours prior to surgery to avoid nicks in your skin which may contribute to postoperative infections.   Contacts, Hearing Aids, dentures or bridgework may not be worn into surgery. DENTURES WILL BE REMOVED PRIOR TO SURGERY PLEASE DO NOT APPLY Poly grip OR ADHESIVES!!!  You may bring a small overnight bag with  you on the day of surgery, only pack items that are not valuable. [facility] IS NOT RESPONSIBLE   FOR VALUABLES THAT ARE LOST OR STOLEN.   Patients discharged on the day of surgery will not be allowed to drive home.  Someone NEEDS to stay with you for the first 24 hours after anesthesia.  Do not bring your home medications to the hospital. The Pharmacy will dispense medications listed on your medication list to you during your admission in the Hospital.  Special Instructions: Bring a copy of your healthcare power of attorney and living will documents the day of surgery, if you wish to have them scanned into your [place] Medical Records- EPIC  Please read over the following fact sheets you were given: IF YOU HAVE QUESTIONS ABOUT YOUR PRE-OP INSTRUCTIONS, PLEASE CALL [phone]   [facility] - Preparing for Surgery      Before surgery, you can play an important role.  Because skin is not sterile, your skin needs to be as free of germs as possible.  You can reduce the number of germs on your skin by washing with CHG (chlorahexidine gluconate) soap before surgery.  CHG is an antiseptic cleaner which kills germs and bonds with the skin to continue killing germs even after washing. Please DO NOT use if you have an allergy to CHG or antibacterial soaps.  If your skin becomes reddened/irritated stop using the CHG and inform your nurse when you arrive at Short Stay. Do not shave (including legs and underarms) for at least 48 hours prior to the first CHG shower.  You may shave your face/neck.  Please follow these instructions carefully:  1.  Shower with CHG Soap the night before surgery ONLY (DO NOT USE THE SOAP THE MORNING OF SURGERY).  2.  If you choose to wash your hair, wash your hair first as usual with your normal  shampoo.  3.  After you shampoo, rinse your hair and body thoroughly to remove the shampoo.  4.  Use CHG as you would any other liquid soap.  You can apply  chg directly to the skin and wash.  Gently with a scrungie or clean washcloth.  5.  Apply the CHG Soap to your body ONLY FROM THE NECK DOWN.   Do not use on face/ open                           Wound or open sores. Avoid contact with eyes, ears mouth and genitals (private parts).                       Wash face,  Genitals (private parts) with your normal soap.             6.  Wash thoroughly, paying special attention to the area where your  surgery  will be performed.  7.  Thoroughly rinse your body with warm water  from the neck down.  8.  DO NOT shower/wash with your normal soap after using and rinsing off the CHG Soap.                9.  Pat yourself dry with a clean towel.            10.  Wear clean pajamas.            11.  Place clean sheets on your bed the night of your first shower and do not  sleep with pets.  Day of Surgery : Do not apply any CHG, lotions/deodorants the morning of surgery.  Please wear clean clothes to the hospital/surgery center.   FAILURE TO FOLLOW THESE INSTRUCTIONS MAY RESULT IN THE CANCELLATION OF YOUR SURGERY  PATIENT SIGNATURE_________________________________  NURSE SIGNATURE__________________________________  ________________________________________________________________________

## 2024-09-05 ENCOUNTER — Encounter (HOSPITAL_COMMUNITY)
Admission: RE | Admit: 2024-09-05 | Discharge: 2024-09-05 | Disposition: A | Source: Ambulatory Visit | Attending: Urology | Admitting: Urology

## 2024-09-05 ENCOUNTER — Encounter (HOSPITAL_COMMUNITY): Payer: Self-pay

## 2024-09-05 ENCOUNTER — Other Ambulatory Visit: Payer: Self-pay

## 2024-09-05 VITALS — BP 119/88 | HR 87 | Temp 98.5°F | Ht 71.0 in | Wt 260.0 lb

## 2024-09-05 DIAGNOSIS — G4730 Sleep apnea, unspecified: Secondary | ICD-10-CM | POA: Diagnosis not present

## 2024-09-05 DIAGNOSIS — G932 Benign intracranial hypertension: Secondary | ICD-10-CM | POA: Diagnosis not present

## 2024-09-05 DIAGNOSIS — I10 Essential (primary) hypertension: Secondary | ICD-10-CM | POA: Diagnosis not present

## 2024-09-05 DIAGNOSIS — I340 Nonrheumatic mitral (valve) insufficiency: Secondary | ICD-10-CM | POA: Diagnosis not present

## 2024-09-05 DIAGNOSIS — Z01812 Encounter for preprocedural laboratory examination: Secondary | ICD-10-CM | POA: Diagnosis not present

## 2024-09-05 DIAGNOSIS — N201 Calculus of ureter: Secondary | ICD-10-CM | POA: Diagnosis not present

## 2024-09-05 HISTORY — DX: Cardiac arrhythmia, unspecified: I49.9

## 2024-09-05 HISTORY — DX: Personal history of urinary calculi: Z87.442

## 2024-09-05 HISTORY — DX: Anemia, unspecified: D64.9

## 2024-09-05 HISTORY — DX: Angina pectoris, unspecified: I20.9

## 2024-09-05 LAB — BASIC METABOLIC PANEL WITH GFR
Anion gap: 10 (ref 5–15)
BUN: 14 mg/dL (ref 6–20)
CO2: 23 mmol/L (ref 22–32)
Calcium: 9.6 mg/dL (ref 8.9–10.3)
Chloride: 109 mmol/L (ref 98–111)
Creatinine, Ser: 1.13 mg/dL — ABNORMAL HIGH (ref 0.44–1.00)
GFR, Estimated: 57 mL/min — ABNORMAL LOW (ref >60)
Glucose, Bld: 94 mg/dL (ref 70–99)
Potassium: 3.7 mmol/L (ref 3.5–5.1)
Sodium: 141 mmol/L (ref 135–145)

## 2024-09-05 LAB — CBC
HCT: 41.7 % (ref 36.0–46.0)
Hemoglobin: 12.9 g/dL (ref 12.0–15.0)
MCH: 28.5 pg (ref 26.0–34.0)
MCHC: 30.9 g/dL (ref 30.0–36.0)
MCV: 92.3 fL (ref 80.0–100.0)
Platelets: 371 K/uL (ref 150–400)
RBC: 4.52 MIL/uL (ref 3.87–5.11)
RDW: 14 % (ref 11.5–15.5)
WBC: 5.5 K/uL (ref 4.0–10.5)
nRBC: 0 % (ref 0.0–0.2)

## 2024-09-05 NOTE — Progress Notes (Signed)
 For Anesthesia: PCP - Marvine Rush, MD  Cardiologist - Delford Maude BROCKS, MD  Clearance:[place], Lum CROME, NP : 08/23/24 Bowel Prep reminder:  Chest x-ray - 07/20/24 EKG - 08/23/24 Stress Test -  ECHO - 12/28/2022 Cardiac Cath - 2005/2011 Pacemaker/ICD device last checked: Pacemaker orders received: Device Rep notified:  Spinal Cord Stimulator:  Sleep Study - Yes CPAP - NO  Fasting Blood Sugar - N/A Checks Blood Sugar _____ times a day Date and result of last Hgb A1c-  Last dose of GLP1 agonist- semaglutide : last dose: 08/31/24. GLP1 instructions: Hold 7 days prior to schedule (Hold 24 hours-daily)   Last dose of SGLT-2 inhibitors- N/A SGLT-2 instructions: Hold 72 hours prior to surgery  Blood Thinner Instructions:N/A Last Dose: Time last taken:  Aspirin  Instructions:N/A Last Dose: Time last taken:  Activity level: Can go up a flight of stairs and activities of daily living without stopping and without chest pain and/or shortness of breath   Able to exercise without chest pain and/or shortness of breath  Anesthesia review: Hx: HTN,OSA(NO CPAP),Atypical chest pain,mitral valve regurgitation.  Patient denies shortness of breath, fever, cough and chest pain at PAT appointment   Patient verbalized understanding of instructions that were reviewed over the telephone.

## 2024-09-06 NOTE — Progress Notes (Signed)
 [name] Chart Review   Case: [id number] Date/Time: 09/12/24 1245   Procedures:      CYSTOSCOPY/URETEROSCOPY/HOLMIUM LASER/STENT PLACEMENT (Left)     CYSTOSCOPY, WITH RETROGRADE PYELOGRAM (Left)   Anesthesia type: General   Diagnosis: Calculus of ureter [N20.1]   Pre-op diagnosis: LEFT CALCULUS OF URETER   Location: [facility]   Surgeons: [name] [name] BIRCH, MD       DISCUSSION:58 y.o. never smoker with h/o PONV, HTN, sleep apnea, idiopathic intracranial HTN, left calculus of ureter scheduled for above procedure 09/12/2024 with Dr. Valli [name].   Pt advised to hold Ozempic  1 week prior to procedure.   Patient reportedly underwent cardiac catheterization in 2005 and 2011 that showed normal coronary anatomy. Echocardiogram on 12/2022 with LVEF 60 to 65%, no RWMA, mild asymmetric LVH of the basal septal segment, normal diastolic parameters, RV function and size normal, normal PASP, mild mitral valve regurgitation. Cardiac PET stress 03/2023 showed no evidence of ischemia or infarction.   Pt seen by cardiology 08/23/2024 for preoperative evaluation. Per OV note, According to the Revised Cardiac Risk Index (RCRI), her Perioperative Risk of Major Cardiac Event is (%): 0.4. Her Functional Capacity in METs is: 5.62 according to the [employer] Activity Status Index (DASI). Therefore, based on ACC/AHA guidelines, patient would be at acceptable risk for the planned procedure without further cardiovascular testing. I will route this recommendation to the requesting party via Epic fax function.  VS: BP 119/88   Pulse 87   Temp 36.9 C (Oral)   Ht 5' 11 (1.803 m)   Wt 117.9 kg   SpO2 95%   BMI 36.26 kg/m   PROVIDERS: [name] Rush, MD is PCP   Cardiologist - [name] Maude BROCKS, MD  LABS: Labs reviewed: Acceptable for surgery. (all labs ordered are listed, but only abnormal results are displayed)  Labs Reviewed  BASIC METABOLIC PANEL WITH GFR - Abnormal; Notable for the following  components:      Result Value   Creatinine, Ser 1.13 (*)    GFR, Estimated 57 (*)    All other components within normal limits  CBC     IMAGES:   EKG:   CV: Echo 12/28/2022 1. Left ventricular ejection fraction, by estimation, is 60 to 65%. The  left ventricle has normal function. The left ventricle has no regional  wall motion abnormalities. There is mild asymmetric left ventricular  hypertrophy of the basal-septal segment.  Left ventricular diastolic parameters were normal.   2. Right ventricular systolic function is normal. The right ventricular  size is normal. There is normal pulmonary artery systolic pressure. The  estimated right ventricular systolic pressure is 22.5 mmHg.   3. The mitral valve is normal in structure. Mild mitral valve  regurgitation. No evidence of mitral stenosis.   4. The aortic valve is tricuspid. Aortic valve regurgitation is not  visualized. No aortic stenosis is present.   5. The inferior vena cava is normal in size with greater than 50%  respiratory variability, suggesting right atrial pressure of 3 mmHg.  Past Medical History:  Diagnosis Date   Anemia    Anginal pain    Anxiety    Arthritis    Asthma    Cervical hyperplasia    hisotry of   Chest pain    cath 2005 norm cors, repeat cath Feb 2011 normal cors and only minimally  elevated pulmonary pressures   Crohn disease (HCC)    Diverticulitis of colon 07/17/2014   Diverticulosis  Dyspnea    Dysrhythmia    GERD (gastroesophageal reflux disease)    Hiatal hernia    History of iron deficiency anemia    History of kidney stones    Hypertension    Idiopathic intracranial hypertension    Per patient   Migraines    Obesity    Palpitations    Pneumonia 2008   14 days hospitalization, bilateral   PONV (postoperative nausea and vomiting)    Sleep apnea sleep study 11/03/2009    cpap; does sometimes, doesnt use every night. weight loss no longer using CPAP   Vitamin B deficiency      Past Surgical History:  Procedure Laterality Date   ABDOMINAL HYSTERECTOMY     ANKLE ARTHROSCOPY  06/01/2012   Procedure: ANKLE ARTHROSCOPY;  Surgeon: [name] DELENA Schwartz, MD;  Location: [facility];  Service: Orthopedics;  Laterality: Left;  left ankle arthorsocpy with extensive debridement and gastroc slide   ANKLE SURGERY Left 05/11/2018   BIOPSY  04/26/2019   Procedure: BIOPSY;  Surgeon: [name] [name] PENNER, MD;  [facility];  Service: Endoscopy;;  ileum colon   BIOPSY  03/25/2022   Procedure: BIOPSY;  Surgeon: [name] Angelia Sieving, MD;  [facility];  Service: Gastroenterology;;   CARDIAC CATHETERIZATION  11/22/2009 and 2005   WNL   CESAREAN SECTION     x 1   CHOLECYSTECTOMY  09/08/2006   lap. chole.   CHONDROPLASTY  06/17/2012   Procedure: CHONDROPLASTY;  Surgeon: [name] FORBES Minerva, MD;  Location: [facility];  Service: Orthopedics;  Laterality: Right;  right patella   COLONOSCOPY N/A 04/26/2019   Rehman: a few erosions in ternimal ileum, two erosions in cecum (assocaited with acute inflammation) diverticulosis in sigmoid hepatic flexure and ascending colon, external hemorrhoids   COLONOSCOPY WITH PROPOFOL  N/A 03/25/2022   Procedure: COLONOSCOPY WITH PROPOFOL ;  Surgeon: [name] Angelia Sieving, MD;  [facility];  Service: Gastroenterology;  Laterality: N/A;  830 ASA 1   ESOPHAGOGASTRODUODENOSCOPY N/A 05/14/2015   Procedure: ESOPHAGOGASTRODUODENOSCOPY (EGD);  Surgeon: [name] Golda, MD;  [facility];  Service: Endoscopy;  Laterality: N/A;  730   ESOPHAGOGASTRODUODENOSCOPY (EGD) WITH PROPOFOL  N/A 03/25/2022   Procedure: ESOPHAGOGASTRODUODENOSCOPY (EGD) WITH PROPOFOL ;  Surgeon: [name] Angelia Sieving, MD;  [facility];  Service: Gastroenterology;  Laterality: N/A;   GIVENS CAPSULE STUDY N/A 05/24/2019   Procedure: GIVENS CAPSULE STUDY;  Surgeon: [name] [name] PENNER, MD;  [facility];  Service:  Endoscopy;  Laterality: N/A;  7:30   INGUINAL HERNIA REPAIR  10/30/2008   right   KNEE ARTHROSCOPY Right 2013   KNEE ARTHROSCOPY WITH LATERAL MENISECTOMY Left 12/23/2016   Procedure: LEFT KNEE ARTHROSCOPY WITH LATERAL MENISECTOMY;  Surgeon: [name] FORBES Minerva, MD;  [facility];  Service: Orthopedics;  Laterality: Left;   KNEE ARTHROSCOPY WITH MEDIAL MENISECTOMY Left 12/23/2016   Procedure: LEFT KNEE ARTHROSCOPY WITH MEDIAL MENISECTOMY CHONDROPLASTY PATELLA  AND MEDIAL FEMORAL CONDYLE LEFT KNEE;  Surgeon: [name] FORBES Minerva, MD;  Location: [facility];  Service: Orthopedics;  Laterality: Left;   POLYPECTOMY  03/25/2022   Procedure: POLYPECTOMY;  Surgeon: [name] Angelia Sieving, MD;  [facility];  Service: Gastroenterology;;   SHOULDER ARTHROSCOPY WITH SUBACROMIAL DECOMPRESSION AND BICEP TENDON REPAIR Left 12/21/2017   Procedure: Left shoulder arthroscopic biceps tenodesis, SAD, DCR and labrum debridement;  Surgeon: Sharl Selinda Dover, MD;  Location: [facility];  Service: Orthopedics;  Laterality: Left;  120 mins   SHOULDER SURGERY  right x 2    TOTAL KNEE ARTHROPLASTY Left 06/06/2019   Procedure: TOTAL KNEE ARTHROPLASTY;  Surgeon: Ernie Cough, MD;  [facility];  Service: Orthopedics;  Laterality: Left;  70 mins   TOTAL KNEE ARTHROPLASTY Right 01/23/2021   Procedure: TOTAL KNEE ARTHROPLASTY;  Surgeon: Ernie Cough, MD;  Location[facility];  Service: Orthopedics;  Laterality: Right;  70 mins    MEDICATIONS:  acetaminophen  (TYLENOL ) 650 MG CR tablet   acetaZOLAMIDE  ER (DIAMOX ) 500 MG capsule   albuterol  (PROVENTIL ) (2.5 MG/3ML) 0.083% nebulizer solution   albuterol  (VENTOLIN  HFA) 108 (90 Base) MCG/ACT inhaler   amitriptyline  (ELAVIL ) 25 MG tablet   amLODipine  (NORVASC ) 5 MG tablet   benzonatate  (TESSALON ) 100 MG capsule   Calcium  Carbonate Antacid (TUMS PO)   Cyanocobalamin  (VITAMIN B-12 IJ)   cyclobenzaprine  (FLEXERIL ) 5 MG tablet   diclofenac  Sodium (VOLTAREN ) 1 %  GEL   dicyclomine  (BENTYL ) 10 MG capsule   diphenhydrAMINE  (BENADRYL ) 25 MG tablet   EPINEPHrine  (EPIPEN  2-PAK) 0.3 mg/0.3 mL IJ SOAJ injection   famotidine  (PEPCID ) 40 MG tablet   fluticasone  (FLONASE ) 50 MCG/ACT nasal spray   Fremanezumab -vfrm (AJOVY ) 225 MG/1.5ML SOAJ   HYDROCORTISONE EX   hyoscyamine  (LEVSIN  SL) 0.125 MG SL tablet   ibuprofen  (ADVIL ) 600 MG tablet   loratadine  (CLARITIN ) 10 MG tablet   losartan  (COZAAR ) 100 MG tablet   MAGNESIUM  CITRATE PO   meclizine  (ANTIVERT ) 25 MG tablet   montelukast  (SINGULAIR ) 10 MG tablet   omeprazole  (PRILOSEC) 40 MG capsule   ondansetron  (ZOFRAN -ODT) 4 MG disintegrating tablet   oxyCODONE -acetaminophen  (PERCOCET/ROXICET) 5-325 MG tablet   polyethylene glycol (MIRALAX  / GLYCOLAX ) 17 g packet   Protein POWD   rizatriptan  (MAXALT -MLT) 10 MG disintegrating tablet   SEMAGLUTIDE  [place]   tamsulosin  (FLOMAX ) 0.4 MG CAPS capsule   vedolizumab  (ENTYVIO ) 300 MG injection   Vitamin D -Vitamin K (VITAMIN K2-VITAMIN D3 PO)   No current facility-administered medications for this encounter.     Harlene Hoots Ward, PA-C [facility] [phone]

## 2024-09-06 NOTE — Anesthesia Preprocedure Evaluation (Addendum)
 Anesthesia Evaluation  Patient identified by MRN, date of birth, ID band Patient awake    Reviewed: Allergy & Precautions, NPO status , Patient's Chart, lab work & pertinent test results  History of Anesthesia Complications (+) PONV and history of anesthetic complications  Airway Mallampati: II  TM Distance: >3 FB Neck ROM: Full    Dental  (+) Teeth Intact, Dental Advisory Given   Pulmonary asthma , sleep apnea    breath sounds clear to auscultation       Cardiovascular hypertension, Pt. on medications  Rhythm:Regular Rate:Normal     Neuro/Psych  Headaches  Anxiety      Neuromuscular disease    GI/Hepatic Neg liver ROS, hiatal hernia,GERD  Medicated and Controlled,,  Endo/Other  negative endocrine ROS    Renal/GU negative Renal ROS     Musculoskeletal  (+) Arthritis ,    Abdominal   Peds  Hematology  (+) Blood dyscrasia, anemia   Anesthesia Other Findings   Reproductive/Obstetrics                              Anesthesia Physical Anesthesia Plan  ASA: 2  Anesthesia Plan: General   Post-op Pain Management: Tylenol  PO (pre-op)*   Induction: Intravenous  PONV Risk Score and Plan: 4 or greater and Ondansetron , Dexamethasone , Midazolam  and Scopolamine patch - Pre-op  Airway Management Planned: LMA  Additional Equipment: None  Intra-op Plan:   Post-operative Plan: Extubation in OR  Informed Consent: I have reviewed the patients History and Physical, chart, labs and discussed the procedure including the risks, benefits and alternatives for the proposed anesthesia with the patient or authorized representative who has indicated his/her understanding and acceptance.     Dental advisory given  Plan Discussed with: CRNA  Anesthesia Plan Comments: (See PAT note 09/05/24)         Anesthesia Quick Evaluation

## 2024-09-07 ENCOUNTER — Encounter: Attending: Gastroenterology | Admitting: Internal Medicine

## 2024-09-07 VITALS — BP 151/94 | HR 73 | Temp 98.8°F | Resp 16

## 2024-09-07 DIAGNOSIS — K50019 Crohn's disease of small intestine with unspecified complications: Secondary | ICD-10-CM | POA: Diagnosis not present

## 2024-09-07 MED ORDER — VEDOLIZUMAB 300 MG IV SOLR
300.0000 mg | Freq: Once | INTRAVENOUS | Status: AC
  Administered 2024-09-07: 300 mg via INTRAVENOUS
  Filled 2024-09-07: qty 5

## 2024-09-07 NOTE — Progress Notes (Signed)
 Diagnosis: , Crohn's Disease  Provider:  Eartha Sieving MD  Procedure: IV Infusion  IV Type: Peripheral, IV Location: R Antecubital  , Entyvio  (Vedolizumab ), Dose: 300 mg  Infusion Start Time: 1454  Infusion Stop Time: 1528  Post Infusion IV Care: Observation period completed  Discharge: Condition: Good, Destination: Home . AVS Declined  Performed by:  Jerson Furukawa R, LPN

## 2024-09-12 ENCOUNTER — Encounter (HOSPITAL_COMMUNITY): Admission: RE | Disposition: A | Payer: Self-pay | Source: Ambulatory Visit | Attending: Urology

## 2024-09-12 ENCOUNTER — Other Ambulatory Visit (HOSPITAL_COMMUNITY): Payer: Self-pay

## 2024-09-12 ENCOUNTER — Ambulatory Visit (HOSPITAL_COMMUNITY): Admission: RE | Admit: 2024-09-12 | Discharge: 2024-09-12 | Disposition: A | Attending: Urology | Admitting: Urology

## 2024-09-12 ENCOUNTER — Ambulatory Visit (HOSPITAL_COMMUNITY): Admitting: Anesthesiology

## 2024-09-12 ENCOUNTER — Other Ambulatory Visit: Payer: Self-pay

## 2024-09-12 ENCOUNTER — Encounter (HOSPITAL_COMMUNITY): Payer: Self-pay | Admitting: Urology

## 2024-09-12 ENCOUNTER — Ambulatory Visit (HOSPITAL_COMMUNITY)

## 2024-09-12 ENCOUNTER — Ambulatory Visit (HOSPITAL_COMMUNITY): Payer: Self-pay | Admitting: Medical

## 2024-09-12 DIAGNOSIS — J45909 Unspecified asthma, uncomplicated: Secondary | ICD-10-CM | POA: Diagnosis not present

## 2024-09-12 DIAGNOSIS — K219 Gastro-esophageal reflux disease without esophagitis: Secondary | ICD-10-CM | POA: Diagnosis not present

## 2024-09-12 DIAGNOSIS — I10 Essential (primary) hypertension: Secondary | ICD-10-CM | POA: Diagnosis not present

## 2024-09-12 DIAGNOSIS — N201 Calculus of ureter: Secondary | ICD-10-CM | POA: Diagnosis not present

## 2024-09-12 DIAGNOSIS — G4730 Sleep apnea, unspecified: Secondary | ICD-10-CM | POA: Diagnosis not present

## 2024-09-12 HISTORY — PX: CYSTOSCOPY/URETEROSCOPY/HOLMIUM LASER/STENT PLACEMENT: SHX6546

## 2024-09-12 SURGERY — CYSTOSCOPY/URETEROSCOPY/HOLMIUM LASER/STENT PLACEMENT
Anesthesia: General | Site: Perineum | Laterality: Left

## 2024-09-12 MED ORDER — FENTANYL CITRATE (PF) 100 MCG/2ML IJ SOLN
INTRAMUSCULAR | Status: DC | PRN
  Administered 2024-09-12: 100 ug via INTRAVENOUS

## 2024-09-12 MED ORDER — PROPOFOL 10 MG/ML IV BOLUS
INTRAVENOUS | Status: DC | PRN
  Administered 2024-09-12: 200 mg via INTRAVENOUS

## 2024-09-12 MED ORDER — LIDOCAINE HCL (PF) 2 % IJ SOLN
INTRAMUSCULAR | Status: DC | PRN
  Administered 2024-09-12: 100 mg via INTRADERMAL

## 2024-09-12 MED ORDER — CHLORHEXIDINE GLUCONATE 0.12 % MT SOLN: 15.0000 mL | Freq: Once | OROMUCOSAL | Status: AC

## 2024-09-12 MED ORDER — FENTANYL CITRATE (PF) 50 MCG/ML IJ SOSY: 25.0000 ug | PREFILLED_SYRINGE | INTRAMUSCULAR | Status: DC | PRN

## 2024-09-12 MED ORDER — OXYCODONE HCL 5 MG PO TABS
5.0000 mg | ORAL_TABLET | Freq: Three times a day (TID) | ORAL | 0 refills | Status: AC | PRN
  Filled 2024-09-12: qty 20, 7d supply, fill #0

## 2024-09-12 MED ORDER — CEFAZOLIN SODIUM-DEXTROSE 2-4 GM/100ML-% IV SOLN
2.0000 g | INTRAVENOUS | Status: AC
  Administered 2024-09-12: 2 g via INTRAVENOUS
  Filled 2024-09-12: qty 100

## 2024-09-12 MED ORDER — ORAL CARE MOUTH RINSE
15.0000 mL | Freq: Once | OROMUCOSAL | Status: AC
  Administered 2024-09-12: 15 mL via OROMUCOSAL

## 2024-09-12 MED ORDER — CIPROFLOXACIN HCL 500 MG PO TABS
500.0000 mg | ORAL_TABLET | Freq: Once | ORAL | 0 refills | Status: AC
  Filled 2024-09-12: qty 1, 1d supply, fill #0

## 2024-09-12 MED ORDER — HYOSCYAMINE SULFATE 0.125 MG PO TBDP
0.1250 mg | ORAL_TABLET | Freq: Four times a day (QID) | ORAL | 0 refills | Status: AC | PRN
  Filled 2024-09-12 (×2): qty 30, 8d supply, fill #0

## 2024-09-12 MED ORDER — MIDAZOLAM HCL 2 MG/2ML IJ SOLN
INTRAMUSCULAR | Status: AC
  Filled 2024-09-12: qty 2

## 2024-09-12 MED ORDER — SUGAMMADEX SODIUM 200 MG/2ML IV SOLN
INTRAVENOUS | Status: AC
  Filled 2024-09-12: qty 2

## 2024-09-12 MED ORDER — SODIUM CHLORIDE 0.9 % IR SOLN
Status: DC | PRN
  Administered 2024-09-12: 3000 mL via INTRAVESICAL

## 2024-09-12 MED ORDER — TAMSULOSIN HCL 0.4 MG PO CAPS
0.4000 mg | ORAL_CAPSULE | Freq: Every day | ORAL | 0 refills | Status: AC
  Filled 2024-09-12: qty 30, 30d supply, fill #0

## 2024-09-12 MED ORDER — FENTANYL CITRATE (PF) 100 MCG/2ML IJ SOLN
INTRAMUSCULAR | Status: AC
  Filled 2024-09-12: qty 2

## 2024-09-12 MED ORDER — MIDAZOLAM HCL 5 MG/5ML IJ SOLN
INTRAMUSCULAR | Status: DC | PRN
  Administered 2024-09-12: 2 mg via INTRAVENOUS

## 2024-09-12 MED ORDER — ONDANSETRON HCL 4 MG/2ML IJ SOLN
INTRAMUSCULAR | Status: DC | PRN
  Administered 2024-09-12: 4 mg via INTRAVENOUS

## 2024-09-12 MED ORDER — LACTATED RINGERS IV SOLN: INTRAVENOUS | Status: DC

## 2024-09-12 MED ORDER — ACETAMINOPHEN 10 MG/ML IV SOLN: 1000.0000 mg | Freq: Once | INTRAVENOUS | Status: DC | PRN

## 2024-09-12 MED ORDER — OXYCODONE HCL 5 MG PO TABS: 5.0000 mg | ORAL_TABLET | Freq: Once | ORAL | Status: DC | PRN

## 2024-09-12 MED ORDER — DROPERIDOL 2.5 MG/ML IJ SOLN: 0.6250 mg | Freq: Once | INTRAMUSCULAR | Status: DC | PRN

## 2024-09-12 MED ORDER — OXYCODONE HCL 5 MG/5ML PO SOLN: 5.0000 mg | Freq: Once | ORAL | Status: DC | PRN

## 2024-09-12 MED ORDER — DEXAMETHASONE SOD PHOSPHATE PF 10 MG/ML IJ SOLN
INTRAMUSCULAR | Status: DC | PRN
  Administered 2024-09-12: 10 mg via INTRAVENOUS

## 2024-09-12 SURGICAL SUPPLY — 19 items
BAG URO CATCHER STRL LF (MISCELLANEOUS) ×1 IMPLANT
BASKET ZERO TIP NITINOL 2.4FR (BASKET) IMPLANT
CATH URETL OPEN 5X70 (CATHETERS) ×1 IMPLANT
CATH URETL OPEN END 6FR 70 (CATHETERS) ×1 IMPLANT
CLOTH BEACON ORANGE TIMEOUT ST (SAFETY) ×1 IMPLANT
DRSG TEGADERM 2-3/8X2-3/4 SM (GAUZE/BANDAGES/DRESSINGS) IMPLANT
FIBER LASER MOSES 200 DFL (Laser) IMPLANT
GLOVE BIO SURGEON STRL SZ 6.5 (GLOVE) ×1 IMPLANT
GOWN STRL REUS W/ TWL LRG LVL3 (GOWN DISPOSABLE) ×1 IMPLANT
GUIDEWIRE STR DUAL SENSOR (WIRE) ×1 IMPLANT
KIT TURNOVER KIT A (KITS) ×1 IMPLANT
MANIFOLD NEPTUNE II (INSTRUMENTS) ×1 IMPLANT
PACK CYSTO (CUSTOM PROCEDURE TRAY) ×1 IMPLANT
SHEATH NAVIGATOR HD 11/13X28 (SHEATH) IMPLANT
SHEATH NAVIGATOR HD 11/13X36 (SHEATH) IMPLANT
STENT URET 6FRX26 CONTOUR (STENTS) IMPLANT
TRACTIP FLEXIVA PULS ID 200XHI (Laser) IMPLANT
TUBING CONNECTING 10 (TUBING) ×1 IMPLANT
TUBING UROLOGY SET (TUBING) ×1 IMPLANT

## 2024-09-12 NOTE — Anesthesia Procedure Notes (Signed)
 Procedure Name: LMA Insertion Date/Time: 09/12/2024 11:32 AM  Performed by: Carleton Garnette SAUNDERS, CRNAPre-anesthesia Checklist: Patient identified, Emergency Drugs available, Suction available, Patient being monitored and Timeout performed Patient Re-evaluated:Patient Re-evaluated prior to induction Oxygen  Delivery Method: Circle system utilized Preoxygenation: Pre-oxygenation with 100% oxygen  Induction Type: IV induction LMA: LMA inserted LMA Size: 4.0 Tube type: Oral Number of attempts: 1 Placement Confirmation: positive ETCO2 and breath sounds checked- equal and bilateral Tube secured with: Tape Dental Injury: Teeth and Oropharynx as per pre-operative assessment

## 2024-09-12 NOTE — Transfer of Care (Signed)
 Immediate Anesthesia Transfer of Care Note  Patient: [name]  Procedure(s) Performed: CYSTOSCOPY/URETEROSCOPY/HOLMIUM LASER/STENT PLACEMENT (Left: Perineum)  Patient Location: PACU  Anesthesia Type:General  Level of Consciousness: sedated  Airway & Oxygen  Therapy: Patient Spontanous Breathing and Patient connected to face mask oxygen   Post-op Assessment: Report given to RN and Post -op Vital signs reviewed and stable  Post vital signs: Reviewed and stable  Last Vitals:  Vitals Value Taken Time  BP    Temp    Pulse 77 09/12/24 12:12  Resp 11 09/12/24 12:12  SpO2 99 % 09/12/24 12:12  Vitals shown include unfiled device data.  Last Pain:  Vitals:   09/12/24 1025  TempSrc:   PainSc: 0-No pain         Complications: No notable events documented.

## 2024-09-12 NOTE — Anesthesia Postprocedure Evaluation (Signed)
 Anesthesia Post Note  Patient: ARABELLA REVELLE  Procedure(s) Performed: CYSTOSCOPY/URETEROSCOPY/HOLMIUM LASER/STENT PLACEMENT (Left: Perineum)     Patient location during evaluation: PACU Anesthesia Type: General Level of consciousness: awake and alert Pain management: pain level controlled Vital Signs Assessment: post-procedure vital signs reviewed and stable Respiratory status: spontaneous breathing, nonlabored ventilation, respiratory function stable and patient connected to nasal cannula oxygen  Cardiovascular status: blood pressure returned to baseline and stable Postop Assessment: no apparent nausea or vomiting Anesthetic complications: no   No notable events documented.  Last Vitals:  Vitals:   09/12/24 1245 09/12/24 1326  BP: (!) 146/93 (!) 177/102  Pulse: 80   Resp: 11   Temp:    SpO2: 99%     Last Pain:  Vitals:   09/12/24 1245  TempSrc:   PainSc: 5                  Franky JONETTA Bald

## 2024-09-12 NOTE — H&P (Addendum)
 Cc: preop HP  02/10/23: 58 year old woman with intermittent gross hematuria that started in March 2024. She had a CT scan at that time that showed bilateral nonobstructing renal calculi 1-2 mm in size. Patient says at that time she had sudden urgency to urinate and had an uncomfortable feeling in her vagina. She had bright red blood after she voided. It happened a few more times after her CT scan. She does have a history of Crohn's disease. She also has remote history of kidney stones in October 2022. She has had a hysterectomy.  03/24/2023: 58 year old woman here for cystoscopy as part of gross hematuria evaluation. No episodes of hemturia in interim.  06/23/2023: 58 year old woman with gross hematuria and negative evaluation here for follow-up. She has had no further episodes of hematuria. She did have an episode of flank pain and was wondering if she may have passed a stone.  08/08/24: Patient here today for low back pain and hematuria. She was seen in the ED on 9/23, CT at that time showed a 3 mm proximal left ureteral stone with mild hydronephrosis, as well as multiple additional nonobstructing left renal stones. She does not recall passing a stone, however says her pain and hematuria subsided a few days following the ED visit. Then about a week ago she started seeing gross hematuria and low back pain. She also complains of suprapubic pressure and increasing frequency. Denies fever, dysuria.  08/17/24: Patient here today in f/u. She had a CT done yesterday that demonstrated a 7 mm left proximal ureteral stone with minimal hydronephrosis. She continues with left sided flank discomfort that has been manageable with NSAIDS and pain medication sparingly. This stone has likely been present and passing for 4-6 weeks without progression, not visible on KUB. She opts for ureteroscopy.    Review of systems: A 12 point comprehensive review of systems was obtained and is negative unless  otherwise stated in the history of present illness.  Patient Active Problem List   Diagnosis Date Noted   IBS (irritable bowel syndrome) 04/17/2024   Hematuria 12/27/2022   Right flank pain 12/27/2022   Change in stool caliber 04/29/2022   RUQ pain 04/29/2022   Right lower quadrant abdominal pain 04/27/2022   Change in bowel movement 01/26/2022   GERD (gastroesophageal reflux disease)    Crohn's ileitis (HCC) 07/31/2021   S/P total knee arthroplasty, right 01/23/2021   IIH (idiopathic intracranial hypertension) 10/14/2020   Discoloration of skin of finger 09/12/2020   Bilateral hand pain 09/12/2020   Paresthesia of skin 09/12/2020   Chronic migraine without aura without status migrainosus, not intractable 06/22/2020   Iron deficiency anemia 06/20/2019   S/P left TKA 06/06/2019   Status post total left knee replacement 06/06/2019   Special screening for malignant neoplasms, colon 03/28/2019   LLQ abdominal pain 09/26/2018   Derangement of anterior horn of lateral meniscus of left knee    Derangement of posterior horn of medial meniscus of left knee    Hypokalemia 04/25/2016   Hyperglycemia 04/25/2016   Diverticulitis of colon 07/17/2014   Nausea without vomiting 07/17/2014   History of arthroscopy of right knee 07/25/2012   Difficulty walking 06/29/2012   Weakness of right leg 06/29/2012   Posterior tibial tendonitis 11/11/2011   PTTD (posterior tibial tendon dysfunction) 11/11/2011   Knee pain, right 11/11/2011   Chest pain 07/16/2011   Primary osteoarthritis of left knee 02/25/2010   PAIN IN JOINT, MULTIPLE SITES 02/25/2010   Essential hypertension  08/13/2009   Diaphragmatic hernia 08/13/2009   PALPITATIONS 08/13/2009   DYSPNEA 08/13/2009    No current facility-administered medications on file prior to encounter.   Current Outpatient Medications on File Prior to Encounter  Medication Sig Dispense Refill   acetaminophen  (TYLENOL ) 650 MG CR tablet Take 1,300 mg by  mouth every 8 (eight) hours as needed for pain.     acetaZOLAMIDE  ER (DIAMOX ) 500 MG capsule Take 1 capsule (500 mg total) by mouth every morning AND 2 capsules (1,000 mg total) every evening. 270 capsule 3   albuterol  (PROVENTIL ) (2.5 MG/3ML) 0.083% nebulizer solution Inhale the contents of 1 vial via nebulizer every 6 hours as needed 90 mL 1   albuterol  (VENTOLIN  HFA) 108 (90 Base) MCG/ACT inhaler Inhale 1-2 puffs by mouth every 4 hours as needed 6.7 g 9   amitriptyline  (ELAVIL ) 25 MG tablet Take 1 tablet (25 mg total) by mouth at bedtime. 90 tablet 3   amLODipine  (NORVASC ) 5 MG tablet Take 1 tablet (5 mg total) by mouth daily. 90 tablet 2   benzonatate  (TESSALON ) 100 MG capsule Take 1 capsule (100 mg total) by mouth 3 (three) times daily as needed for cough. Do not take with alcohol or while operating or driving heavy machinery 60 capsule 0   Calcium  Carbonate Antacid (TUMS PO) Take 1 tablet by mouth daily as needed (heartburn).     Cyanocobalamin  (VITAMIN B-12 IJ) Inject 1 Dose as directed every 30 (thirty) days.     cyclobenzaprine  (FLEXERIL ) 5 MG tablet Take 1 tablet (5 mg total) by mouth every 8 (eight) hours as needed for muscle spasms. 40 tablet 2   diclofenac  Sodium (VOLTAREN ) 1 % GEL Apply 1 Application topically 4 (four) times daily as needed (pain).     dicyclomine  (BENTYL ) 10 MG capsule Take 1 capsule (10 mg total) by mouth every 12 (twelve) hours as needed (abdominal pain). 180 capsule 1   diphenhydrAMINE  (BENADRYL ) 25 MG tablet Take 25-50 mg by mouth every 6 (six) hours as needed for allergies.     EPINEPHrine  (EPIPEN  2-PAK) 0.3 mg/0.3 mL IJ SOAJ injection Inject as directed for anaphylaxis. (Patient taking differently: Inject 0.3 mg into the muscle as needed for anaphylaxis.) 2 each 5   famotidine  (PEPCID ) 40 MG tablet Take 1 tablet by mouth at bedtime. Needs office visit. 90 tablet 3   fluticasone  (FLONASE ) 50 MCG/ACT nasal spray Place 2 sprays into both nostrils daily. (Patient  taking differently: Place 2 sprays into both nostrils daily as needed for allergies.) 16 g 6   Fremanezumab -vfrm (AJOVY ) 225 MG/1.5ML SOAJ Inject 225 mg into the skin every 30 (thirty) days. 4.5 mL 3   HYDROCORTISONE EX Apply 1 Application topically daily as needed (eczema).     hyoscyamine  (LEVSIN  SL) 0.125 MG SL tablet Dissolve 1 tablet under the tongue 3 times daily before meals. (Patient taking differently: Place 0.125 mg under the tongue 3 (three) times daily before meals. As needed.) 90 tablet 1   loratadine  (CLARITIN ) 10 MG tablet Take 10 mg by mouth at bedtime.     losartan  (COZAAR ) 100 MG tablet Take 1 tablet (100 mg total) by mouth daily. 90 tablet 1   MAGNESIUM  CITRATE PO Take 1,000 mg by mouth daily at 12 noon.     montelukast  (SINGULAIR ) 10 MG tablet Take 1 tablet (10 mg total) by mouth daily. (Patient taking differently: Take 10 mg by mouth at bedtime.) 90 tablet 3   omeprazole  (PRILOSEC) 40 MG capsule Take 1 capsule (40  mg total) by mouth daily. 90 capsule 3   ondansetron  (ZOFRAN -ODT) 4 MG disintegrating tablet Dissolve 1 tablet by mouth every 8 hours as needed for nausea or vomiting. 90 tablet 1   polyethylene glycol (MIRALAX  / GLYCOLAX ) 17 g packet Take 17 g by mouth daily. (Patient taking differently: Take 17 g by mouth as needed.)     Protein POWD Take 1 Scoop by mouth daily.     rizatriptan  (MAXALT -MLT) 10 MG disintegrating tablet Dissolve 1 tablet (10 mg total) by mouth as needed for migraine. May repeat in 2 hours if needed 9 tablet 11   SEMAGLUTIDE  [place] Inject 5 mg into the skin once a week.     tamsulosin  (FLOMAX ) 0.4 MG CAPS capsule Take 1 capsule (0.4 mg total) by mouth daily. (Patient taking differently: Take 0.4 mg by mouth daily as needed (kidney stone).) 30 capsule 0   vedolizumab  (ENTYVIO ) 300 MG injection Inject 300 mg into the vein every 8 (eight) weeks. (Patient taking differently: Inject 300 mg into the vein every 6 (six) weeks.)     Vitamin D -Vitamin K (VITAMIN  K2-VITAMIN D3 PO) Take 2 tablets by mouth daily.     ibuprofen  (ADVIL ) 600 MG tablet Take 1 tablet (600 mg total) by mouth every 6 (six) hours as needed. (Patient not taking: Reported on 09/04/2024) 30 tablet 0   meclizine  (ANTIVERT ) 25 MG tablet Take 25 mg by mouth 3 (three) times daily as needed for dizziness. (Patient not taking: Reported on 09/04/2024)      Past Medical History:  Diagnosis Date   Anemia    Anginal pain    Anxiety    Arthritis    Asthma    Cervical hyperplasia    hisotry of   Chest pain    cath 2005 norm cors, repeat cath Feb 2011 normal cors and only minimally  elevated pulmonary pressures   Crohn disease (HCC)    Diverticulitis of colon 07/17/2014   Diverticulosis    Dyspnea    Dysrhythmia    GERD (gastroesophageal reflux disease)    Hiatal hernia    History of iron deficiency anemia    History of kidney stones    Hypertension    Idiopathic intracranial hypertension    Per patient   Migraines    Obesity    Palpitations    Pneumonia 2008   14 days hospitalization, bilateral   PONV (postoperative nausea and vomiting)    Sleep apnea sleep study 11/03/2009    cpap; does sometimes, doesnt use every night. weight loss no longer using CPAP   Vitamin B deficiency     Past Surgical History:  Procedure Laterality Date   ABDOMINAL HYSTERECTOMY     ANKLE ARTHROSCOPY  06/01/2012   Procedure: ANKLE ARTHROSCOPY;  Surgeon: Deward DELENA Schwartz, MD;  Location: [facility];  Service: Orthopedics;  Laterality: Left;  left ankle arthorsocpy with extensive debridement and gastroc slide   ANKLE SURGERY Left 05/11/2018   BIOPSY  04/26/2019   Procedure: BIOPSY;  Surgeon: Golda Claudis PENNER, MD;  Location: [facility];  Service: Endoscopy;;  ileum colon   BIOPSY  03/25/2022   Procedure: BIOPSY;  Surgeon: Eartha Angelia Sieving, MD;  Location: [facility];  Service: Gastroenterology;;   CARDIAC CATHETERIZATION  11/22/2009 and 2005   WNL   CESAREAN SECTION      x 1   CHOLECYSTECTOMY  09/08/2006   lap. chole.   CHONDROPLASTY  06/17/2012   Procedure: CHONDROPLASTY;  Surgeon: Taft BRAVO  Margrette, MD;  Location: [facility];  Service: Orthopedics;  Laterality: Right;  right patella   COLONOSCOPY N/A 04/26/2019   Rehman: a few erosions in ternimal ileum, two erosions in cecum (assocaited with acute inflammation) diverticulosis in sigmoid hepatic flexure and ascending colon, external hemorrhoids   COLONOSCOPY WITH PROPOFOL  N/A 03/25/2022   Procedure: COLONOSCOPY WITH PROPOFOL ;  Surgeon: Eartha Angelia Sieving, MD;  Location: [facility];  Service: Gastroenterology;  Laterality: N/A;  830 ASA 1   ESOPHAGOGASTRODUODENOSCOPY N/A 05/14/2015   Procedure: ESOPHAGOGASTRODUODENOSCOPY (EGD);  Surgeon: Claudis RAYMOND Rivet, MD;  Location: [facility];  Service: Endoscopy;  Laterality: N/A;  730   ESOPHAGOGASTRODUODENOSCOPY (EGD) WITH PROPOFOL  N/A 03/25/2022   Procedure: ESOPHAGOGASTRODUODENOSCOPY (EGD) WITH PROPOFOL ;  Surgeon: Eartha Angelia Sieving, MD;  [facility]: [facility];  Service: Gastroenterology;  Laterality: N/A;   GIVENS CAPSULE STUDY N/A 05/24/2019   Procedure: GIVENS CAPSULE STUDY;  Surgeon: Rivet Claudis RAYMOND, MD;  Location: [facility];  Service: Endoscopy;  Laterality: N/A;  7:30   INGUINAL HERNIA REPAIR  10/30/2008   right   KNEE ARTHROSCOPY Right 2013   KNEE ARTHROSCOPY WITH LATERAL MENISECTOMY Left 12/23/2016   Procedure: LEFT KNEE ARTHROSCOPY WITH LATERAL MENISECTOMY;  Surgeon: Taft FORBES Margrette, MD;  Location: [facility];  Service: Orthopedics;  Laterality: Left;   KNEE ARTHROSCOPY WITH MEDIAL MENISECTOMY Left 12/23/2016   Procedure: LEFT KNEE ARTHROSCOPY WITH MEDIAL MENISECTOMY CHONDROPLASTY PATELLA  AND MEDIAL FEMORAL CONDYLE LEFT KNEE;  Surgeon: Taft FORBES Margrette, MD;  Location: [facility];  Service: Orthopedics;  Laterality: Left;   POLYPECTOMY  03/25/2022   Procedure: POLYPECTOMY;  Surgeon: Eartha Angelia Sieving, MD;  [facility];  Service: Gastroenterology;;   SHOULDER ARTHROSCOPY WITH SUBACROMIAL DECOMPRESSION AND BICEP TENDON REPAIR Left 12/21/2017   Procedure: Left shoulder arthroscopic biceps tenodesis, SAD, DCR and labrum debridement;  Surgeon: Sharl Selinda Dover, MD;  Location: [facility];  Service: Orthopedics;  Laterality: Left;  120 mins   SHOULDER SURGERY     right x 2    TOTAL KNEE ARTHROPLASTY Left 06/06/2019   Procedure: TOTAL KNEE ARTHROPLASTY;  Surgeon: Ernie Cough, MD;  Location: [facility];  Service: Orthopedics;  Laterality: Left;  70 mins   TOTAL KNEE ARTHROPLASTY Right 01/23/2021   Procedure: TOTAL KNEE ARTHROPLASTY;  Surgeon: Ernie Cough, MD;  Location: [facility];  Service: Orthopedics;  Laterality: Right;  70 mins    Social History   Tobacco Use   Smoking status: Never    Passive exposure: Never   Smokeless tobacco: Never  Vaping Use   Vaping status: Never Used  Substance Use Topics   Alcohol use: No    Alcohol/week: 0.0 standard drinks of alcohol   Drug use: Never    Family History  Problem Relation Age of Onset   Hypertension Mother    Atrial fibrillation Mother    Arthritis Mother    Lupus Father    COPD Father    Rheum arthritis Father    Sarcoidosis Father    Lupus Sister    Congestive Heart Failure Maternal Grandmother    Migraines Neg Hx    Headache Neg Hx     PE: There were no vitals filed for this visit. Patient appears to be in no acute distress  patient is alert and oriented x3 Atraumatic normocephalic head No cervical or supraclavicular lymphadenopathy appreciated No increased work of breathing, no audible wheezes/rhonchi Regular sinus rhythm/rate Abdomen is soft, nontender, nondistended, no CVA or suprapubic tenderness Lower extremities are  symmetric without appreciable edema Grossly neurologically intact No identifiable skin lesions  No results for input(s): WBC, HGB, HCT in the last 72 hours. No results for input(s): NA, K, CL, CO2,  GLUCOSE, BUN, CREATININE, CALCIUM  in the last 72 hours. No results for input(s): LABPT, INR in the last 72 hours. No results for input(s): LABURIN in the last 72 hours. Results for orders placed or performed in visit on 07/17/24  Calprotectin, Fecal     Status: None   Collection Time: 07/19/24  3:06 PM   Specimen: Stool  Result Value Ref Range Status   Calprotectin, Fecal 41 0 - 120 ug/g Final    Comment: Concentration     Interpretation   Follow-Up < 5 - 50 ug/g     Normal           None >50 -120 ug/g     Borderline       Re-evaluate in 4-6 weeks     >120 ug/g     Abnormal         Repeat as clinically                                    indicated    *Note: Due to a large number of results and/or encounters for the requested time period, some results have not been displayed. A complete set of results can be found in Results Review.    Imaging: IMPRESSION: 1. Mild left hydronephrosis with associated 3 mm proximal left ureteral calculus at L3-4. 2. Multiple nonobstructing left renal calculi measuring up to 5 mm in the upper kidney.   Electronically signed by: Pinkie Pebbles MD 06/27/2024 10:36 PM EDT RP Workstation: [id number]  IMPRESSION: 1. There is a 0.7 cm calculus in the proximal third of the left ureter just distal to the ureterovesicular junction. No significant hydronephrosis. 2. Additional punctuate nonobstructive calculus of the superior pole of the right kidney. 3. Pancolonic diverticulosis without evidence of acute diverticulitis. 4. Status post cholecystectomy and hysterectomy.   Electronically Signed By: Marolyn JONETTA Jaksch M.D. On: 08/16/2024 16:23   A/P:  Urolithiasis -7 mm proximal left ureteral calculus that has not passed.  We discussed risks and benefits of left ureteroscopy with laser lithotripsy and stent placement.  These include but are not limited to pain, bleeding, infection, ureteral stricture, damage to surrounding structures, ureteral  perforation, need for additional treatment, inability to remove stone, need for staged procedure.  -informed consent obtained and surgery scheduled for today.    Humbert Morozov D Cheyne Boulden

## 2024-09-12 NOTE — Op Note (Signed)
 Preoperative diagnosis: left ureteral calculus  Postoperative diagnosis: left ureteral calculus  Procedure:  Cystoscopy left ureteroscopy, laser lithotripsy, basket stone extraction left 77F x 26cm ureteral stent placement - with string  Surgeon: Valli Shank, MD  Anesthesia: General  Complications: None  Intraoperative findings:  Normal urethra Large rectocele Bilateral orthotropic ureteral orifices Bladder mucosa normal without masses   EBL: Minimal  Specimens: left ureteral calculus  Disposition of specimens: [facility] for stone analysis  Indication: [name] is a 58 y.o.   patient with a 7mm proximal left ureteral stone and associated left symptoms. After reviewing the management options for treatment, the patient elected to proceed with the above surgical procedure(s). We have discussed the potential benefits and risks of the procedure, side effects of the proposed treatment, the likelihood of the patient achieving the goals of the procedure, and any potential problems that might occur during the procedure or recuperation. Informed consent has been obtained.   Description of procedure:  The patient was taken to the operating room and general anesthesia was induced.  The patient was placed in the dorsal lithotomy position, prepped and draped in the usual sterile fashion, and preoperative antibiotics were administered. A preoperative time-out was performed.   Cystourethroscopy was performed.  The patient's urethra was examined and was normal.  The bladder was then systematically examined in its entirety. There was no evidence for any bladder tumors, stones, or other mucosal pathology.    Attention then turned to the left ureteral orifice and a  0.38 sensor guidewire was then advanced up the left ureter into the renal pelvis under fluoroscopic guidance. The 4.5 Fr semirigid ureteroscope was then advanced into the ureter next to the guidewire and the  calculus was identified.   The stone was then fragmented with the 200 micron holmium laser fiber.All stone fragments were then removed from the ureter with a 0 tip basket.  Reinspection of the ureter revealed no remaining visible stones or fragments.   The wire was then backloaded through the cystoscope and a ureteral stent was advance over the wire using Seldinger technique.  The stent was positioned appropriately under fluoroscopic and cystoscopic guidance.  The wire was then removed with an adequate stent curl noted in the renal pelvis as well as in the bladder.  The bladder was then emptied and the procedure ended.  The patient appeared to tolerate the procedure well and without complications.  The patient was able to be awakened and transferred to the recovery unit in satisfactory condition.   Disposition: The tether of the stent was left on and tucked inside the patient's vagina.  Instructions for removing the stent have been provided to the patient.

## 2024-09-13 ENCOUNTER — Other Ambulatory Visit (HOSPITAL_COMMUNITY): Payer: Self-pay

## 2024-09-13 ENCOUNTER — Encounter (HOSPITAL_COMMUNITY): Payer: Self-pay | Admitting: Urology

## 2024-09-13 MED ORDER — CYANOCOBALAMIN 1000 MCG/ML IJ SOLN
1000.0000 ug | INTRAMUSCULAR | 3 refills | Status: AC
  Filled 2024-09-13: qty 3, 90d supply, fill #0

## 2024-09-14 ENCOUNTER — Other Ambulatory Visit: Payer: Self-pay

## 2024-09-26 ENCOUNTER — Other Ambulatory Visit (HOSPITAL_COMMUNITY): Payer: Self-pay

## 2024-09-26 DIAGNOSIS — N201 Calculus of ureter: Secondary | ICD-10-CM | POA: Diagnosis not present

## 2024-10-03 ENCOUNTER — Ambulatory Visit: Admitting: Dermatology

## 2024-10-04 ENCOUNTER — Ambulatory Visit (INDEPENDENT_AMBULATORY_CARE_PROVIDER_SITE_OTHER): Admitting: Physician Assistant

## 2024-10-04 ENCOUNTER — Encounter: Payer: Self-pay | Admitting: Physician Assistant

## 2024-10-04 ENCOUNTER — Other Ambulatory Visit (HOSPITAL_COMMUNITY): Payer: Self-pay

## 2024-10-04 ENCOUNTER — Other Ambulatory Visit: Payer: Self-pay

## 2024-10-04 VITALS — BP 119/81

## 2024-10-04 DIAGNOSIS — L821 Other seborrheic keratosis: Secondary | ICD-10-CM | POA: Diagnosis not present

## 2024-10-04 DIAGNOSIS — Z1283 Encounter for screening for malignant neoplasm of skin: Secondary | ICD-10-CM | POA: Diagnosis not present

## 2024-10-04 DIAGNOSIS — D239 Other benign neoplasm of skin, unspecified: Secondary | ICD-10-CM

## 2024-10-04 DIAGNOSIS — D2372 Other benign neoplasm of skin of left lower limb, including hip: Secondary | ICD-10-CM | POA: Diagnosis not present

## 2024-10-04 DIAGNOSIS — W908XXA Exposure to other nonionizing radiation, initial encounter: Secondary | ICD-10-CM

## 2024-10-04 DIAGNOSIS — D1801 Hemangioma of skin and subcutaneous tissue: Secondary | ICD-10-CM

## 2024-10-04 DIAGNOSIS — L814 Other melanin hyperpigmentation: Secondary | ICD-10-CM

## 2024-10-04 DIAGNOSIS — L219 Seborrheic dermatitis, unspecified: Secondary | ICD-10-CM

## 2024-10-04 DIAGNOSIS — D229 Melanocytic nevi, unspecified: Secondary | ICD-10-CM

## 2024-10-04 DIAGNOSIS — L578 Other skin changes due to chronic exposure to nonionizing radiation: Secondary | ICD-10-CM

## 2024-10-04 DIAGNOSIS — D849 Immunodeficiency, unspecified: Secondary | ICD-10-CM

## 2024-10-04 MED ORDER — HYDROCORTISONE 2.5 % EX CREA
TOPICAL_CREAM | Freq: Every day | CUTANEOUS | 5 refills | Status: AC
  Filled 2024-10-04: qty 30, 30d supply, fill #0

## 2024-10-04 NOTE — Patient Instructions (Signed)

## 2024-10-04 NOTE — Progress Notes (Signed)
" ° °  New Patient Visit   Subjective  [name] is a 58 y.o. female NEW PATIENT who presents for the following:   Total Body Skin Exam (TBSE)  The patient reports she has spots, moles and lesions to be evaluated, some may be new or changing and the patient may have concern these could be cancer. Patient has not previously been treated by a dermatologist.  Denied Hx of Bx. Denied family Hx of skin cancers. Patient does not apply sunscreen and/or wears protective coverings.  The following portions of the chart were reviewed this encounter and updated as appropriate: medications, allergies, medical history  Review of Systems:  No other skin or systemic complaints except as noted in HPI or Assessment and Plan.  Objective  Well appearing patient in no apparent distress; mood and affect are within normal limits.  A full examination was performed including scalp, head, eyes, ears, nose, lips, neck, chest, axillae, abdomen, back, buttocks, bilateral upper extremities, bilateral lower extremities, hands, feet, fingers, toes, fingernails, and toenails. All findings within normal limits unless otherwise noted below.    Relevant exam findings are noted in the Assessment and Plan.   Assessment & Plan   LENTIGINES, SEBORRHEIC KERATOSES, HEMANGIOMAS - Benign normal skin lesions - Benign-appearing - Call for any changes  BENIGN MELANOCYTIC NEVI - Tan-brown and/or pink-flesh-colored symmetric macules and papules - Benign appearing on exam today - Observation - Call clinic for new or changing moles - Recommend daily use of broad spectrum spf 30+ sunscreen to sun-exposed areas.   MILD ACTINIC DAMAGE - Chronic condition, secondary to cumulative UV/sun exposure - diffuse scaly erythematous macules with underlying dyspigmentation - Recommend daily broad spectrum sunscreen SPF 30+ to sun-exposed areas, reapply every 2 hours as needed.  - Staying in the shade or wearing long sleeves, sun glasses  (UVA+UVB protection) and wide brim hats (4-inch brim around the entire circumference of the hat) are also recommended for sun protection.  - Call for new or changing lesions.  DERMATOFIBROMA Exam: Firm pink/brown papulenodule with dimple sign at L shin  Treatment Plan: A dermatofibroma is a benign growth possibly related to trauma, such as an insect bite, cut from shaving, or inflamed acne-type bump.  Treatment options to remove include shave or excision with resulting scar and risk of recurrence.  Since benign-appearing and not bothersome, will observe for now.   SEBORRHEIC DERMATITIS Exam: Pink patches with greasy scale at face  flared  Seborrheic Dermatitis is a chronic persistent rash characterized by pinkness and scaling most commonly of the mid face but also can occur on the scalp (dandruff), ears; mid chest, mid back and groin.  It tends to be exacerbated by stress and cooler weather.  People who have neurologic disease may experience new onset or exacerbation of existing seborrheic dermatitis.  The condition is not curable but treatable and can be controlled.  Treatment Plan: - Rx hydrocortisone 2.5% cream - apply daily until    SKIN CANCER SCREENING PERFORMED TODAY SCREENING EXAM FOR SKIN CANCER   ACTINIC SKIN DAMAGE   CHERRY ANGIOMA   DERMATOFIBROMA   LENTIGINES   MULTIPLE BENIGN NEVI   SEBORRHEIC KERATOSIS   IMMUNOSUPPRESSED STATUS   SEBORRHEIC DERMATITIS    Return in about 1 year (around 10/04/2025) for TBSE.   Documentation: I have reviewed the above documentation for accuracy and completeness, and I agree with the above.  I, Shirron Maranda, CMA II, am acting as scribe for:  Lonn Im K, PA-C "

## 2024-10-06 ENCOUNTER — Telehealth: Payer: Self-pay

## 2024-10-06 NOTE — Telephone Encounter (Deleted)
 Auth Submission: PENDING Site of care: Site of care: [facility] Payer: [employer] Medication & CPT/J Code(s) submitted: Entyvio  (Vedolizumab ) J3380 Diagnosis Code:  Route of submission (phone, fax, portal): phone Phone # Fax # Auth type: Buy/Bill PB Units/visits requested: 300mg  q6weeks Reference number: [id number] Approval from:  to     Authorization has been DENIED because

## 2024-10-09 ENCOUNTER — Telehealth: Payer: Self-pay

## 2024-10-09 NOTE — Telephone Encounter (Signed)
 Auth Submission: APPROVED Site of care: Site of care: [facility] Payer: [employer] ppo Medication & CPT/J Code(s) submitted: Entyvio  (Vedolizumab ) J3380 Diagnosis Code:  Route of submission (phone, fax, portal): portal Phone # Fax # Auth type: Buy/Bill PB Units/visits requested: 300mg  q6 weeks Reference number: [id number] Approval from: 10/06/24 to 10/06/25

## 2024-10-11 ENCOUNTER — Other Ambulatory Visit (HOSPITAL_COMMUNITY): Payer: Self-pay

## 2024-10-11 ENCOUNTER — Other Ambulatory Visit: Payer: Self-pay

## 2024-10-11 MED ORDER — AMLODIPINE BESYLATE 5 MG PO TABS
5.0000 mg | ORAL_TABLET | Freq: Every day | ORAL | 1 refills | Status: AC
  Filled 2024-10-11: qty 90, 90d supply, fill #0

## 2024-10-11 MED FILL — Fluticasone Propionate Nasal Susp 50 MCG/ACT: NASAL | 30 days supply | Qty: 16 | Fill #1 | Status: AC

## 2024-10-12 ENCOUNTER — Ambulatory Visit

## 2024-10-12 ENCOUNTER — Other Ambulatory Visit (HOSPITAL_COMMUNITY): Payer: Self-pay

## 2024-10-19 ENCOUNTER — Encounter: Attending: Gastroenterology | Admitting: *Deleted

## 2024-10-19 VITALS — BP 141/99 | HR 84 | Temp 97.8°F | Resp 16

## 2024-10-19 DIAGNOSIS — K50019 Crohn's disease of small intestine with unspecified complications: Secondary | ICD-10-CM | POA: Insufficient documentation

## 2024-10-19 MED ORDER — VEDOLIZUMAB 300 MG IV SOLR
300.0000 mg | Freq: Once | INTRAVENOUS | Status: AC
  Administered 2024-10-19: 300 mg via INTRAVENOUS
  Filled 2024-10-19: qty 5

## 2024-10-19 NOTE — Progress Notes (Signed)
 Diagnosis: Crohn's Disease  Provider:  Shaaron Charleston MD  Procedure: IV Infusion  IV Type: Peripheral, IV Location: L Antecubital  Entyvio  (Vedolizumab ), Dose: 300 mg  Infusion Start Time: 1441  Infusion Stop Time: 1526  Post Infusion IV Care: Peripheral IV Discontinued  Discharge: Condition: Good, Destination: Home . AVS Declined  Performed by:  Baldwin Darice Helling, RN

## 2024-10-26 ENCOUNTER — Telehealth: Payer: Self-pay | Admitting: Pharmacy Technician

## 2024-10-26 NOTE — Telephone Encounter (Addendum)
 Patient is receiving co-pay assistance  Medication: Entyvio  Program: Entyvio  Co-pay Program Approval Dates: Approved from 08/27/21 until 10/04/25 ID: [id number] Award Amount: $ 20,000

## 2024-10-31 ENCOUNTER — Encounter: Payer: Self-pay | Admitting: Internal Medicine

## 2024-11-10 ENCOUNTER — Other Ambulatory Visit: Payer: Self-pay

## 2024-11-10 ENCOUNTER — Encounter (INDEPENDENT_AMBULATORY_CARE_PROVIDER_SITE_OTHER): Payer: Self-pay | Admitting: Gastroenterology

## 2024-11-30 ENCOUNTER — Ambulatory Visit

## 2025-01-11 ENCOUNTER — Ambulatory Visit

## 2025-02-12 ENCOUNTER — Ambulatory Visit: Admitting: Dermatology

## 2025-02-22 ENCOUNTER — Ambulatory Visit

## 2025-04-30 ENCOUNTER — Ambulatory Visit: Admitting: Family Medicine

## 2025-10-08 ENCOUNTER — Ambulatory Visit: Admitting: Physician Assistant
